# Patient Record
Sex: Female | Born: 1970 | Race: White | Hispanic: No | Smoking: Never smoker
Health system: Southern US, Community
[De-identification: ages and names within clinical notes are randomized; demographics above are authoritative.]

## PROBLEM LIST (undated history)

## (undated) DIAGNOSIS — G4730 Sleep apnea, unspecified: Secondary | ICD-10-CM

## (undated) DIAGNOSIS — M4802 Spinal stenosis, cervical region: Secondary | ICD-10-CM

## (undated) DIAGNOSIS — F419 Anxiety disorder, unspecified: Secondary | ICD-10-CM

## (undated) DIAGNOSIS — F329 Major depressive disorder, single episode, unspecified: Secondary | ICD-10-CM

## (undated) DIAGNOSIS — K589 Irritable bowel syndrome without diarrhea: Secondary | ICD-10-CM

## (undated) DIAGNOSIS — F32A Depression, unspecified: Secondary | ICD-10-CM

## (undated) DIAGNOSIS — R569 Unspecified convulsions: Secondary | ICD-10-CM

## (undated) DIAGNOSIS — M1990 Unspecified osteoarthritis, unspecified site: Secondary | ICD-10-CM

## (undated) DIAGNOSIS — R7303 Prediabetes: Secondary | ICD-10-CM

## (undated) DIAGNOSIS — J189 Pneumonia, unspecified organism: Secondary | ICD-10-CM

## (undated) DIAGNOSIS — I10 Essential (primary) hypertension: Secondary | ICD-10-CM

## (undated) DIAGNOSIS — E662 Morbid (severe) obesity with alveolar hypoventilation: Secondary | ICD-10-CM

## (undated) DIAGNOSIS — Z8709 Personal history of other diseases of the respiratory system: Secondary | ICD-10-CM

## (undated) DIAGNOSIS — F319 Bipolar disorder, unspecified: Secondary | ICD-10-CM

## (undated) DIAGNOSIS — R0902 Hypoxemia: Secondary | ICD-10-CM

## (undated) DIAGNOSIS — G5602 Carpal tunnel syndrome, left upper limb: Secondary | ICD-10-CM

## (undated) DIAGNOSIS — I2781 Cor pulmonale (chronic): Secondary | ICD-10-CM

## (undated) DIAGNOSIS — F4000 Agoraphobia, unspecified: Secondary | ICD-10-CM

## (undated) DIAGNOSIS — E785 Hyperlipidemia, unspecified: Secondary | ICD-10-CM

## (undated) DIAGNOSIS — I5021 Acute systolic (congestive) heart failure: Secondary | ICD-10-CM

## (undated) DIAGNOSIS — J302 Other seasonal allergic rhinitis: Secondary | ICD-10-CM

## (undated) HISTORY — DX: Agoraphobia, unspecified: F40.00

## (undated) HISTORY — DX: Morbid (severe) obesity with alveolar hypoventilation: E66.2

## (undated) HISTORY — DX: Hyperlipidemia, unspecified: E78.5

## (undated) HISTORY — DX: Prediabetes: R73.03

## (undated) HISTORY — DX: Cor pulmonale (chronic): I27.81

## (undated) HISTORY — DX: Personal history of other diseases of the respiratory system: Z87.09

## (undated) HISTORY — DX: Bipolar disorder, unspecified: F31.9

## (undated) HISTORY — DX: Hypoxemia: R09.02

## (undated) HISTORY — DX: Morbid (severe) obesity due to excess calories: E66.01

## (undated) HISTORY — DX: Acute systolic (congestive) heart failure: I50.21

## (undated) HISTORY — DX: Depression, unspecified: F32.A

## (undated) HISTORY — DX: Major depressive disorder, single episode, unspecified: F32.9

## (undated) HISTORY — PX: WISDOM TOOTH EXTRACTION: SHX21

## (undated) HISTORY — DX: Essential (primary) hypertension: I10

---

## 1998-05-03 ENCOUNTER — Ambulatory Visit (HOSPITAL_BASED_OUTPATIENT_CLINIC_OR_DEPARTMENT_OTHER): Admission: RE | Admit: 1998-05-03 | Discharge: 1998-05-03 | Payer: Self-pay | Admitting: Oral Surgery

## 1998-05-09 ENCOUNTER — Ambulatory Visit (HOSPITAL_COMMUNITY): Admission: RE | Admit: 1998-05-09 | Discharge: 1998-05-09 | Payer: Self-pay | Admitting: Specialist

## 1998-05-09 ENCOUNTER — Encounter: Payer: Self-pay | Admitting: Specialist

## 2000-02-22 ENCOUNTER — Ambulatory Visit (HOSPITAL_BASED_OUTPATIENT_CLINIC_OR_DEPARTMENT_OTHER): Admission: RE | Admit: 2000-02-22 | Discharge: 2000-02-22 | Payer: Self-pay | Admitting: Otolaryngology

## 2000-09-23 ENCOUNTER — Encounter: Payer: Self-pay | Admitting: Gastroenterology

## 2000-09-23 ENCOUNTER — Encounter: Admission: RE | Admit: 2000-09-23 | Discharge: 2000-09-23 | Payer: Self-pay | Admitting: Gastroenterology

## 2001-06-16 ENCOUNTER — Other Ambulatory Visit: Admission: RE | Admit: 2001-06-16 | Discharge: 2001-06-16 | Payer: Self-pay | Admitting: Obstetrics and Gynecology

## 2001-07-07 ENCOUNTER — Ambulatory Visit (HOSPITAL_COMMUNITY): Admission: RE | Admit: 2001-07-07 | Discharge: 2001-07-07 | Payer: Self-pay | Admitting: Obstetrics and Gynecology

## 2001-07-07 ENCOUNTER — Encounter: Payer: Self-pay | Admitting: Obstetrics and Gynecology

## 2001-10-23 ENCOUNTER — Encounter: Admission: RE | Admit: 2001-10-23 | Discharge: 2002-01-21 | Payer: Self-pay | Admitting: Internal Medicine

## 2001-12-18 ENCOUNTER — Encounter: Admission: RE | Admit: 2001-12-18 | Discharge: 2002-03-18 | Payer: Self-pay | Admitting: Internal Medicine

## 2002-07-23 ENCOUNTER — Other Ambulatory Visit: Admission: RE | Admit: 2002-07-23 | Discharge: 2002-07-23 | Payer: Self-pay | Admitting: Obstetrics and Gynecology

## 2003-08-25 ENCOUNTER — Other Ambulatory Visit: Admission: RE | Admit: 2003-08-25 | Discharge: 2003-08-25 | Payer: Self-pay | Admitting: Obstetrics and Gynecology

## 2003-11-30 ENCOUNTER — Ambulatory Visit: Payer: Self-pay | Admitting: Psychology

## 2003-12-07 ENCOUNTER — Ambulatory Visit: Payer: Self-pay | Admitting: Psychology

## 2003-12-14 ENCOUNTER — Ambulatory Visit: Payer: Self-pay | Admitting: Psychology

## 2003-12-28 ENCOUNTER — Ambulatory Visit: Payer: Self-pay | Admitting: Psychology

## 2004-01-04 ENCOUNTER — Ambulatory Visit: Payer: Self-pay | Admitting: Psychology

## 2004-01-11 ENCOUNTER — Ambulatory Visit: Payer: Self-pay | Admitting: Psychology

## 2004-01-18 ENCOUNTER — Ambulatory Visit: Payer: Self-pay | Admitting: Psychology

## 2004-01-25 ENCOUNTER — Ambulatory Visit: Payer: Self-pay | Admitting: Psychology

## 2004-02-08 ENCOUNTER — Ambulatory Visit: Payer: Self-pay | Admitting: Psychology

## 2004-02-15 ENCOUNTER — Ambulatory Visit: Payer: Self-pay | Admitting: Psychology

## 2004-02-22 ENCOUNTER — Ambulatory Visit: Payer: Self-pay | Admitting: Psychology

## 2004-02-29 ENCOUNTER — Ambulatory Visit: Payer: Self-pay | Admitting: Psychology

## 2004-03-07 ENCOUNTER — Ambulatory Visit: Payer: Self-pay | Admitting: Psychology

## 2004-03-14 ENCOUNTER — Ambulatory Visit: Payer: Self-pay | Admitting: Psychology

## 2004-03-21 ENCOUNTER — Ambulatory Visit: Payer: Self-pay | Admitting: Psychology

## 2004-04-04 ENCOUNTER — Ambulatory Visit: Payer: Self-pay | Admitting: Psychology

## 2004-04-11 ENCOUNTER — Ambulatory Visit: Payer: Self-pay | Admitting: Psychology

## 2004-04-18 ENCOUNTER — Ambulatory Visit: Payer: Self-pay | Admitting: Psychology

## 2004-04-25 ENCOUNTER — Ambulatory Visit: Payer: Self-pay | Admitting: Psychology

## 2004-05-09 ENCOUNTER — Ambulatory Visit: Payer: Self-pay | Admitting: Psychology

## 2004-05-16 ENCOUNTER — Ambulatory Visit: Payer: Self-pay | Admitting: Psychology

## 2004-05-23 ENCOUNTER — Ambulatory Visit: Payer: Self-pay | Admitting: Psychology

## 2004-06-01 ENCOUNTER — Ambulatory Visit: Payer: Self-pay | Admitting: Psychology

## 2004-06-06 ENCOUNTER — Ambulatory Visit: Payer: Self-pay | Admitting: Psychology

## 2004-06-13 ENCOUNTER — Ambulatory Visit: Payer: Self-pay | Admitting: Psychology

## 2004-06-27 ENCOUNTER — Ambulatory Visit: Payer: Self-pay | Admitting: Psychology

## 2004-07-12 ENCOUNTER — Ambulatory Visit: Payer: Self-pay | Admitting: Psychology

## 2004-07-18 ENCOUNTER — Ambulatory Visit: Payer: Self-pay | Admitting: Psychology

## 2004-07-24 ENCOUNTER — Encounter: Admission: RE | Admit: 2004-07-24 | Discharge: 2004-10-22 | Payer: Self-pay | Admitting: Internal Medicine

## 2004-07-25 ENCOUNTER — Ambulatory Visit: Payer: Self-pay | Admitting: Psychology

## 2004-08-03 ENCOUNTER — Ambulatory Visit: Payer: Self-pay | Admitting: Psychology

## 2004-08-15 ENCOUNTER — Ambulatory Visit: Payer: Self-pay | Admitting: Psychology

## 2004-08-22 ENCOUNTER — Ambulatory Visit: Payer: Self-pay | Admitting: Psychology

## 2004-08-25 ENCOUNTER — Other Ambulatory Visit: Admission: RE | Admit: 2004-08-25 | Discharge: 2004-08-25 | Payer: Self-pay | Admitting: Obstetrics and Gynecology

## 2004-08-29 ENCOUNTER — Ambulatory Visit: Payer: Self-pay | Admitting: Psychology

## 2004-09-07 ENCOUNTER — Ambulatory Visit: Payer: Self-pay | Admitting: Psychology

## 2004-09-19 ENCOUNTER — Ambulatory Visit: Payer: Self-pay | Admitting: Psychology

## 2004-09-26 ENCOUNTER — Ambulatory Visit: Payer: Self-pay | Admitting: Psychology

## 2004-10-03 ENCOUNTER — Ambulatory Visit: Payer: Self-pay | Admitting: Psychology

## 2004-10-10 ENCOUNTER — Ambulatory Visit: Payer: Self-pay | Admitting: Psychology

## 2004-10-17 ENCOUNTER — Ambulatory Visit: Payer: Self-pay | Admitting: Psychology

## 2004-10-24 ENCOUNTER — Ambulatory Visit: Payer: Self-pay | Admitting: Psychology

## 2004-10-31 ENCOUNTER — Ambulatory Visit: Payer: Self-pay | Admitting: Psychology

## 2004-11-07 ENCOUNTER — Ambulatory Visit: Payer: Self-pay | Admitting: Psychology

## 2004-11-14 ENCOUNTER — Ambulatory Visit: Payer: Self-pay | Admitting: Psychology

## 2004-11-28 ENCOUNTER — Ambulatory Visit: Payer: Self-pay | Admitting: Psychology

## 2004-12-05 ENCOUNTER — Ambulatory Visit: Payer: Self-pay | Admitting: Psychology

## 2004-12-12 ENCOUNTER — Ambulatory Visit: Payer: Self-pay | Admitting: Psychology

## 2004-12-20 ENCOUNTER — Ambulatory Visit: Payer: Self-pay | Admitting: Psychology

## 2004-12-26 ENCOUNTER — Ambulatory Visit: Payer: Self-pay | Admitting: Psychology

## 2005-01-02 ENCOUNTER — Ambulatory Visit: Payer: Self-pay | Admitting: Psychology

## 2005-01-09 ENCOUNTER — Ambulatory Visit: Payer: Self-pay | Admitting: Psychology

## 2005-01-16 ENCOUNTER — Ambulatory Visit: Payer: Self-pay | Admitting: Psychology

## 2005-02-06 ENCOUNTER — Ambulatory Visit: Payer: Self-pay | Admitting: Psychology

## 2005-02-13 ENCOUNTER — Ambulatory Visit: Payer: Self-pay | Admitting: Psychology

## 2005-02-20 ENCOUNTER — Ambulatory Visit: Payer: Self-pay | Admitting: Psychology

## 2005-02-27 ENCOUNTER — Ambulatory Visit: Payer: Self-pay | Admitting: Psychology

## 2005-03-13 ENCOUNTER — Ambulatory Visit: Payer: Self-pay | Admitting: Psychology

## 2005-03-27 ENCOUNTER — Ambulatory Visit: Payer: Self-pay | Admitting: Psychology

## 2005-04-03 ENCOUNTER — Ambulatory Visit: Payer: Self-pay | Admitting: Psychology

## 2005-04-10 ENCOUNTER — Ambulatory Visit: Payer: Self-pay | Admitting: Psychology

## 2005-05-01 ENCOUNTER — Ambulatory Visit: Payer: Self-pay | Admitting: Psychology

## 2005-05-08 ENCOUNTER — Ambulatory Visit: Payer: Self-pay | Admitting: Psychology

## 2005-05-15 ENCOUNTER — Ambulatory Visit: Payer: Self-pay | Admitting: Psychology

## 2005-05-22 ENCOUNTER — Ambulatory Visit: Payer: Self-pay | Admitting: Psychology

## 2005-05-28 ENCOUNTER — Emergency Department (HOSPITAL_COMMUNITY): Admission: EM | Admit: 2005-05-28 | Discharge: 2005-05-28 | Payer: Self-pay | Admitting: Emergency Medicine

## 2005-05-29 ENCOUNTER — Ambulatory Visit: Payer: Self-pay | Admitting: Psychology

## 2005-06-05 ENCOUNTER — Ambulatory Visit: Payer: Self-pay | Admitting: Psychology

## 2005-06-12 ENCOUNTER — Ambulatory Visit: Payer: Self-pay | Admitting: Psychology

## 2005-06-19 ENCOUNTER — Ambulatory Visit: Payer: Self-pay | Admitting: Psychology

## 2005-06-26 ENCOUNTER — Ambulatory Visit: Payer: Self-pay | Admitting: Psychology

## 2005-07-03 ENCOUNTER — Ambulatory Visit: Payer: Self-pay | Admitting: Psychology

## 2005-07-10 ENCOUNTER — Ambulatory Visit: Payer: Self-pay | Admitting: Psychology

## 2005-07-17 ENCOUNTER — Ambulatory Visit: Payer: Self-pay | Admitting: Psychology

## 2005-07-24 ENCOUNTER — Ambulatory Visit: Payer: Self-pay | Admitting: Psychology

## 2005-07-31 ENCOUNTER — Ambulatory Visit: Payer: Self-pay | Admitting: Psychology

## 2005-08-07 ENCOUNTER — Ambulatory Visit: Payer: Self-pay | Admitting: Psychology

## 2005-08-14 ENCOUNTER — Ambulatory Visit: Payer: Self-pay | Admitting: Psychology

## 2005-08-21 ENCOUNTER — Ambulatory Visit: Payer: Self-pay | Admitting: Psychology

## 2005-08-28 ENCOUNTER — Ambulatory Visit: Payer: Self-pay | Admitting: Psychology

## 2005-09-04 ENCOUNTER — Ambulatory Visit: Payer: Self-pay | Admitting: Psychology

## 2005-09-11 ENCOUNTER — Ambulatory Visit: Payer: Self-pay | Admitting: Psychology

## 2005-09-25 ENCOUNTER — Ambulatory Visit: Payer: Self-pay | Admitting: Psychology

## 2005-10-02 ENCOUNTER — Ambulatory Visit: Payer: Self-pay | Admitting: Psychology

## 2005-10-09 ENCOUNTER — Ambulatory Visit: Payer: Self-pay | Admitting: Psychology

## 2005-10-16 ENCOUNTER — Ambulatory Visit: Payer: Self-pay | Admitting: Psychology

## 2005-10-30 ENCOUNTER — Ambulatory Visit: Payer: Self-pay | Admitting: Psychology

## 2005-11-06 ENCOUNTER — Ambulatory Visit: Payer: Self-pay | Admitting: Psychology

## 2005-11-13 ENCOUNTER — Ambulatory Visit: Payer: Self-pay | Admitting: Psychology

## 2005-11-27 ENCOUNTER — Ambulatory Visit: Payer: Self-pay | Admitting: Psychology

## 2005-12-04 ENCOUNTER — Ambulatory Visit: Payer: Self-pay | Admitting: Psychology

## 2005-12-18 ENCOUNTER — Ambulatory Visit: Payer: Self-pay | Admitting: Psychology

## 2005-12-25 ENCOUNTER — Ambulatory Visit: Payer: Self-pay | Admitting: Psychology

## 2006-01-01 ENCOUNTER — Ambulatory Visit: Payer: Self-pay | Admitting: Psychology

## 2006-01-08 ENCOUNTER — Ambulatory Visit: Payer: Self-pay | Admitting: Psychology

## 2006-01-15 ENCOUNTER — Ambulatory Visit: Payer: Self-pay | Admitting: Psychology

## 2006-01-31 ENCOUNTER — Ambulatory Visit: Payer: Self-pay | Admitting: Psychology

## 2006-02-05 ENCOUNTER — Ambulatory Visit: Payer: Self-pay | Admitting: Psychology

## 2006-02-12 ENCOUNTER — Ambulatory Visit: Payer: Self-pay | Admitting: Psychology

## 2006-02-19 ENCOUNTER — Ambulatory Visit: Payer: Self-pay | Admitting: Psychology

## 2006-02-26 ENCOUNTER — Ambulatory Visit: Payer: Self-pay | Admitting: Psychology

## 2006-03-05 ENCOUNTER — Ambulatory Visit: Payer: Self-pay | Admitting: Psychology

## 2006-03-12 ENCOUNTER — Ambulatory Visit: Payer: Self-pay | Admitting: Psychology

## 2006-03-19 ENCOUNTER — Ambulatory Visit: Payer: Self-pay | Admitting: Psychology

## 2006-03-26 ENCOUNTER — Ambulatory Visit: Payer: Self-pay | Admitting: Psychology

## 2006-04-09 ENCOUNTER — Ambulatory Visit: Payer: Self-pay | Admitting: Psychology

## 2006-04-16 ENCOUNTER — Ambulatory Visit: Payer: Self-pay | Admitting: Psychology

## 2006-04-30 ENCOUNTER — Ambulatory Visit: Payer: Self-pay | Admitting: Psychology

## 2006-05-07 ENCOUNTER — Ambulatory Visit: Payer: Self-pay | Admitting: Psychology

## 2006-05-14 ENCOUNTER — Ambulatory Visit: Payer: Self-pay | Admitting: Psychology

## 2006-05-21 ENCOUNTER — Ambulatory Visit: Payer: Self-pay | Admitting: Psychology

## 2006-05-28 ENCOUNTER — Ambulatory Visit: Payer: Self-pay | Admitting: Psychology

## 2006-06-04 ENCOUNTER — Ambulatory Visit: Payer: Self-pay | Admitting: Psychology

## 2006-06-11 ENCOUNTER — Ambulatory Visit: Payer: Self-pay | Admitting: Psychology

## 2006-06-25 ENCOUNTER — Ambulatory Visit: Payer: Self-pay | Admitting: Psychology

## 2006-07-02 ENCOUNTER — Ambulatory Visit: Payer: Self-pay | Admitting: Psychology

## 2006-07-30 ENCOUNTER — Ambulatory Visit: Payer: Self-pay | Admitting: Psychology

## 2006-08-06 ENCOUNTER — Ambulatory Visit: Payer: Self-pay | Admitting: Psychology

## 2006-08-13 ENCOUNTER — Ambulatory Visit: Payer: Self-pay | Admitting: Psychology

## 2006-08-20 ENCOUNTER — Ambulatory Visit: Payer: Self-pay | Admitting: Psychology

## 2006-08-27 ENCOUNTER — Ambulatory Visit: Payer: Self-pay | Admitting: Psychology

## 2006-09-03 ENCOUNTER — Ambulatory Visit: Payer: Self-pay | Admitting: Psychology

## 2006-09-10 ENCOUNTER — Ambulatory Visit: Payer: Self-pay | Admitting: Psychology

## 2006-10-01 ENCOUNTER — Ambulatory Visit: Payer: Self-pay | Admitting: Psychology

## 2006-10-15 ENCOUNTER — Ambulatory Visit: Payer: Self-pay | Admitting: Psychology

## 2006-11-05 ENCOUNTER — Ambulatory Visit: Payer: Self-pay | Admitting: Psychology

## 2006-11-12 ENCOUNTER — Ambulatory Visit: Payer: Self-pay | Admitting: Psychology

## 2006-11-19 ENCOUNTER — Ambulatory Visit: Payer: Self-pay | Admitting: Psychology

## 2006-11-26 ENCOUNTER — Ambulatory Visit: Payer: Self-pay | Admitting: Psychology

## 2006-12-03 ENCOUNTER — Ambulatory Visit: Payer: Self-pay | Admitting: Psychology

## 2006-12-10 ENCOUNTER — Ambulatory Visit: Payer: Self-pay | Admitting: Psychology

## 2006-12-17 ENCOUNTER — Ambulatory Visit: Payer: Self-pay | Admitting: Psychology

## 2006-12-24 ENCOUNTER — Ambulatory Visit: Payer: Self-pay | Admitting: Psychology

## 2006-12-31 ENCOUNTER — Ambulatory Visit: Payer: Self-pay | Admitting: Psychology

## 2007-01-07 ENCOUNTER — Ambulatory Visit: Payer: Self-pay | Admitting: Psychology

## 2007-01-14 ENCOUNTER — Ambulatory Visit: Payer: Self-pay | Admitting: Psychology

## 2007-01-21 ENCOUNTER — Ambulatory Visit: Payer: Self-pay | Admitting: Psychology

## 2007-02-04 ENCOUNTER — Ambulatory Visit: Payer: Self-pay | Admitting: Psychology

## 2007-02-11 ENCOUNTER — Ambulatory Visit: Payer: Self-pay | Admitting: Psychology

## 2007-02-18 ENCOUNTER — Ambulatory Visit: Payer: Self-pay | Admitting: Psychology

## 2007-02-25 ENCOUNTER — Ambulatory Visit: Payer: Self-pay | Admitting: Psychology

## 2007-03-04 ENCOUNTER — Ambulatory Visit: Payer: Self-pay | Admitting: Psychology

## 2007-03-11 ENCOUNTER — Ambulatory Visit: Payer: Self-pay | Admitting: Psychology

## 2007-03-25 ENCOUNTER — Ambulatory Visit: Payer: Self-pay | Admitting: Psychology

## 2007-04-08 ENCOUNTER — Ambulatory Visit: Payer: Self-pay | Admitting: Psychology

## 2007-04-15 ENCOUNTER — Ambulatory Visit: Payer: Self-pay | Admitting: Psychology

## 2007-04-29 ENCOUNTER — Ambulatory Visit: Payer: Self-pay | Admitting: Psychology

## 2007-05-06 ENCOUNTER — Ambulatory Visit: Payer: Self-pay | Admitting: Psychology

## 2007-05-13 ENCOUNTER — Ambulatory Visit: Payer: Self-pay | Admitting: Psychology

## 2007-05-20 ENCOUNTER — Ambulatory Visit: Payer: Self-pay | Admitting: Psychology

## 2007-05-27 ENCOUNTER — Ambulatory Visit: Payer: Self-pay | Admitting: Psychology

## 2007-06-10 ENCOUNTER — Ambulatory Visit: Payer: Self-pay | Admitting: Psychology

## 2007-06-24 ENCOUNTER — Ambulatory Visit: Payer: Self-pay | Admitting: Psychology

## 2007-07-01 ENCOUNTER — Ambulatory Visit: Payer: Self-pay | Admitting: Psychology

## 2007-07-08 ENCOUNTER — Ambulatory Visit: Payer: Self-pay | Admitting: Psychology

## 2007-07-15 ENCOUNTER — Ambulatory Visit: Payer: Self-pay | Admitting: Psychology

## 2007-07-22 ENCOUNTER — Ambulatory Visit: Payer: Self-pay | Admitting: Psychology

## 2007-08-19 ENCOUNTER — Ambulatory Visit: Payer: Self-pay | Admitting: Psychology

## 2007-08-26 ENCOUNTER — Ambulatory Visit: Payer: Self-pay | Admitting: Psychology

## 2007-09-02 ENCOUNTER — Ambulatory Visit: Payer: Self-pay | Admitting: Psychology

## 2007-09-16 ENCOUNTER — Ambulatory Visit: Payer: Self-pay | Admitting: Psychology

## 2007-09-23 ENCOUNTER — Ambulatory Visit: Payer: Self-pay | Admitting: Psychology

## 2007-09-30 ENCOUNTER — Ambulatory Visit: Payer: Self-pay | Admitting: Psychology

## 2007-10-07 ENCOUNTER — Ambulatory Visit: Payer: Self-pay | Admitting: Psychology

## 2007-10-28 ENCOUNTER — Ambulatory Visit: Payer: Self-pay | Admitting: Psychology

## 2007-11-11 ENCOUNTER — Ambulatory Visit: Payer: Self-pay | Admitting: Psychology

## 2007-11-18 ENCOUNTER — Ambulatory Visit: Payer: Self-pay | Admitting: Psychology

## 2007-11-25 ENCOUNTER — Ambulatory Visit: Payer: Self-pay | Admitting: Psychology

## 2007-12-16 ENCOUNTER — Ambulatory Visit: Payer: Self-pay | Admitting: Psychology

## 2007-12-23 ENCOUNTER — Ambulatory Visit: Payer: Self-pay | Admitting: Psychology

## 2007-12-30 ENCOUNTER — Ambulatory Visit: Payer: Self-pay | Admitting: Psychology

## 2008-01-13 ENCOUNTER — Ambulatory Visit: Payer: Self-pay | Admitting: Psychology

## 2008-01-20 ENCOUNTER — Ambulatory Visit: Payer: Self-pay | Admitting: Psychology

## 2008-01-27 ENCOUNTER — Ambulatory Visit: Payer: Self-pay | Admitting: Psychology

## 2008-02-10 ENCOUNTER — Ambulatory Visit: Payer: Self-pay | Admitting: Psychology

## 2008-02-17 ENCOUNTER — Ambulatory Visit: Payer: Self-pay | Admitting: Psychology

## 2008-02-24 ENCOUNTER — Ambulatory Visit: Payer: Self-pay | Admitting: Psychology

## 2008-03-02 ENCOUNTER — Ambulatory Visit: Payer: Self-pay | Admitting: Psychology

## 2008-03-16 ENCOUNTER — Ambulatory Visit: Payer: Self-pay | Admitting: Psychology

## 2008-03-23 ENCOUNTER — Ambulatory Visit: Payer: Self-pay | Admitting: Psychology

## 2008-03-30 ENCOUNTER — Ambulatory Visit: Payer: Self-pay | Admitting: Psychology

## 2008-04-06 ENCOUNTER — Ambulatory Visit: Payer: Self-pay | Admitting: Psychology

## 2008-04-13 ENCOUNTER — Ambulatory Visit: Payer: Self-pay | Admitting: Psychology

## 2008-04-20 ENCOUNTER — Ambulatory Visit: Payer: Self-pay | Admitting: Psychology

## 2008-04-27 ENCOUNTER — Ambulatory Visit: Payer: Self-pay | Admitting: Psychology

## 2008-05-04 ENCOUNTER — Ambulatory Visit: Payer: Self-pay | Admitting: Psychology

## 2008-05-11 ENCOUNTER — Ambulatory Visit: Payer: Self-pay | Admitting: Psychology

## 2008-05-25 ENCOUNTER — Ambulatory Visit: Payer: Self-pay | Admitting: Psychology

## 2008-06-01 ENCOUNTER — Ambulatory Visit: Payer: Self-pay | Admitting: Psychology

## 2008-06-08 ENCOUNTER — Ambulatory Visit: Payer: Self-pay | Admitting: Psychology

## 2008-06-15 ENCOUNTER — Ambulatory Visit: Payer: Self-pay | Admitting: Psychology

## 2008-06-21 ENCOUNTER — Ambulatory Visit: Payer: Self-pay | Admitting: Psychology

## 2008-06-29 ENCOUNTER — Ambulatory Visit: Payer: Self-pay | Admitting: Psychology

## 2008-07-06 ENCOUNTER — Ambulatory Visit: Payer: Self-pay | Admitting: Psychology

## 2008-07-13 ENCOUNTER — Ambulatory Visit: Payer: Self-pay | Admitting: Psychology

## 2008-07-20 ENCOUNTER — Ambulatory Visit: Payer: Self-pay | Admitting: Psychology

## 2008-08-10 ENCOUNTER — Ambulatory Visit: Payer: Self-pay | Admitting: Psychology

## 2008-08-17 ENCOUNTER — Ambulatory Visit: Payer: Self-pay | Admitting: Psychology

## 2008-08-24 ENCOUNTER — Ambulatory Visit: Payer: Self-pay | Admitting: Psychology

## 2008-08-31 ENCOUNTER — Ambulatory Visit: Payer: Self-pay | Admitting: Psychology

## 2008-09-14 ENCOUNTER — Ambulatory Visit: Payer: Self-pay | Admitting: Psychology

## 2008-09-21 ENCOUNTER — Ambulatory Visit: Payer: Self-pay | Admitting: Psychology

## 2008-10-05 ENCOUNTER — Ambulatory Visit: Payer: Self-pay | Admitting: Psychology

## 2008-10-12 ENCOUNTER — Ambulatory Visit: Payer: Self-pay | Admitting: Psychology

## 2008-10-19 ENCOUNTER — Ambulatory Visit: Payer: Self-pay | Admitting: Psychology

## 2008-10-26 ENCOUNTER — Ambulatory Visit: Payer: Self-pay | Admitting: Psychology

## 2008-11-02 ENCOUNTER — Ambulatory Visit: Payer: Self-pay | Admitting: Psychology

## 2008-11-09 ENCOUNTER — Ambulatory Visit: Payer: Self-pay | Admitting: Psychology

## 2008-11-16 ENCOUNTER — Ambulatory Visit: Payer: Self-pay | Admitting: Psychology

## 2008-11-23 ENCOUNTER — Ambulatory Visit: Payer: Self-pay | Admitting: Psychology

## 2008-11-30 ENCOUNTER — Ambulatory Visit: Payer: Self-pay | Admitting: Psychology

## 2008-12-07 ENCOUNTER — Ambulatory Visit: Payer: Self-pay | Admitting: Psychology

## 2008-12-21 ENCOUNTER — Ambulatory Visit: Payer: Self-pay | Admitting: Psychology

## 2008-12-28 ENCOUNTER — Ambulatory Visit: Payer: Self-pay | Admitting: Psychology

## 2009-01-04 ENCOUNTER — Ambulatory Visit: Payer: Self-pay | Admitting: Psychology

## 2009-01-18 ENCOUNTER — Ambulatory Visit: Payer: Self-pay | Admitting: Psychology

## 2009-01-25 ENCOUNTER — Ambulatory Visit: Payer: Self-pay | Admitting: Psychology

## 2009-02-15 ENCOUNTER — Ambulatory Visit: Payer: Self-pay | Admitting: Psychology

## 2009-02-22 ENCOUNTER — Ambulatory Visit: Payer: Self-pay | Admitting: Psychology

## 2009-03-01 ENCOUNTER — Ambulatory Visit: Payer: Self-pay | Admitting: Psychology

## 2009-03-08 ENCOUNTER — Ambulatory Visit: Payer: Self-pay | Admitting: Psychology

## 2009-03-15 ENCOUNTER — Ambulatory Visit: Payer: Self-pay | Admitting: Psychology

## 2009-03-22 ENCOUNTER — Ambulatory Visit: Payer: Self-pay | Admitting: Psychology

## 2009-03-29 ENCOUNTER — Ambulatory Visit: Payer: Self-pay | Admitting: Psychology

## 2009-04-12 ENCOUNTER — Ambulatory Visit: Payer: Self-pay | Admitting: Psychology

## 2009-04-19 ENCOUNTER — Ambulatory Visit: Payer: Self-pay | Admitting: Psychology

## 2009-04-26 ENCOUNTER — Ambulatory Visit: Payer: Self-pay | Admitting: Psychology

## 2009-05-03 ENCOUNTER — Ambulatory Visit: Payer: Self-pay | Admitting: Psychology

## 2009-05-10 ENCOUNTER — Ambulatory Visit: Payer: Self-pay | Admitting: Psychology

## 2009-06-01 ENCOUNTER — Ambulatory Visit: Payer: Self-pay | Admitting: Psychology

## 2009-06-07 ENCOUNTER — Ambulatory Visit: Payer: Self-pay | Admitting: Psychology

## 2009-06-14 ENCOUNTER — Ambulatory Visit: Payer: Self-pay | Admitting: Psychology

## 2009-06-21 ENCOUNTER — Ambulatory Visit: Payer: Self-pay | Admitting: Psychology

## 2009-06-28 ENCOUNTER — Ambulatory Visit: Payer: Self-pay | Admitting: Psychology

## 2009-07-05 ENCOUNTER — Ambulatory Visit: Payer: Self-pay | Admitting: Psychology

## 2009-07-12 ENCOUNTER — Ambulatory Visit: Payer: Self-pay | Admitting: Psychology

## 2009-07-19 ENCOUNTER — Ambulatory Visit: Payer: Self-pay | Admitting: Psychology

## 2009-08-02 ENCOUNTER — Ambulatory Visit: Payer: Self-pay | Admitting: Psychology

## 2009-08-09 ENCOUNTER — Ambulatory Visit: Payer: Self-pay | Admitting: Psychology

## 2009-08-16 ENCOUNTER — Ambulatory Visit: Payer: Self-pay | Admitting: Psychology

## 2009-08-23 ENCOUNTER — Ambulatory Visit: Payer: Self-pay | Admitting: Psychology

## 2009-08-30 ENCOUNTER — Ambulatory Visit: Payer: Self-pay | Admitting: Psychology

## 2009-09-06 ENCOUNTER — Ambulatory Visit: Payer: Self-pay | Admitting: Psychology

## 2009-09-20 ENCOUNTER — Ambulatory Visit: Payer: Self-pay | Admitting: Psychology

## 2009-09-27 ENCOUNTER — Ambulatory Visit: Payer: Self-pay | Admitting: Psychology

## 2009-10-04 ENCOUNTER — Ambulatory Visit: Payer: Self-pay | Admitting: Psychology

## 2009-10-11 ENCOUNTER — Ambulatory Visit: Payer: Self-pay | Admitting: Psychology

## 2009-10-18 ENCOUNTER — Ambulatory Visit: Payer: Self-pay | Admitting: Psychology

## 2009-10-25 ENCOUNTER — Ambulatory Visit: Payer: Self-pay | Admitting: Psychology

## 2009-11-08 ENCOUNTER — Ambulatory Visit: Payer: Self-pay | Admitting: Psychology

## 2009-11-15 ENCOUNTER — Ambulatory Visit: Payer: Self-pay | Admitting: Psychology

## 2009-11-22 ENCOUNTER — Ambulatory Visit: Payer: Self-pay | Admitting: Psychology

## 2009-11-29 ENCOUNTER — Ambulatory Visit: Payer: Self-pay | Admitting: Psychology

## 2009-12-06 ENCOUNTER — Ambulatory Visit: Payer: Self-pay | Admitting: Psychology

## 2009-12-13 ENCOUNTER — Ambulatory Visit: Payer: Self-pay | Admitting: Psychology

## 2009-12-20 ENCOUNTER — Ambulatory Visit: Payer: Self-pay | Admitting: Psychology

## 2009-12-27 ENCOUNTER — Ambulatory Visit: Payer: Self-pay | Admitting: Psychology

## 2010-01-03 ENCOUNTER — Ambulatory Visit: Payer: Self-pay | Admitting: Psychology

## 2010-01-10 ENCOUNTER — Ambulatory Visit: Payer: Self-pay | Admitting: Psychology

## 2010-01-17 ENCOUNTER — Ambulatory Visit: Payer: Self-pay | Admitting: Psychology

## 2010-01-24 ENCOUNTER — Ambulatory Visit: Payer: Self-pay | Admitting: Psychology

## 2010-01-31 ENCOUNTER — Ambulatory Visit
Admission: RE | Admit: 2010-01-31 | Discharge: 2010-01-31 | Payer: Self-pay | Source: Home / Self Care | Attending: Psychology | Admitting: Psychology

## 2010-02-07 ENCOUNTER — Ambulatory Visit
Admission: RE | Admit: 2010-02-07 | Discharge: 2010-02-07 | Payer: Self-pay | Source: Home / Self Care | Attending: Psychology | Admitting: Psychology

## 2010-02-14 ENCOUNTER — Ambulatory Visit
Admission: RE | Admit: 2010-02-14 | Discharge: 2010-02-14 | Payer: Self-pay | Source: Home / Self Care | Attending: Psychology | Admitting: Psychology

## 2010-02-21 ENCOUNTER — Ambulatory Visit
Admission: RE | Admit: 2010-02-21 | Discharge: 2010-02-21 | Payer: Self-pay | Source: Home / Self Care | Attending: Psychology | Admitting: Psychology

## 2010-02-28 ENCOUNTER — Ambulatory Visit: Admit: 2010-02-28 | Payer: Self-pay | Admitting: Psychology

## 2010-03-07 ENCOUNTER — Ambulatory Visit (INDEPENDENT_AMBULATORY_CARE_PROVIDER_SITE_OTHER): Payer: PRIVATE HEALTH INSURANCE | Admitting: Psychology

## 2010-03-07 DIAGNOSIS — F319 Bipolar disorder, unspecified: Secondary | ICD-10-CM

## 2010-03-21 ENCOUNTER — Ambulatory Visit (INDEPENDENT_AMBULATORY_CARE_PROVIDER_SITE_OTHER): Payer: PRIVATE HEALTH INSURANCE | Admitting: Psychology

## 2010-03-21 DIAGNOSIS — F319 Bipolar disorder, unspecified: Secondary | ICD-10-CM

## 2010-03-28 ENCOUNTER — Ambulatory Visit (INDEPENDENT_AMBULATORY_CARE_PROVIDER_SITE_OTHER): Payer: PRIVATE HEALTH INSURANCE | Admitting: Psychology

## 2010-03-28 DIAGNOSIS — F319 Bipolar disorder, unspecified: Secondary | ICD-10-CM

## 2010-04-04 ENCOUNTER — Ambulatory Visit: Payer: Medicare Other | Admitting: Psychology

## 2010-04-11 ENCOUNTER — Ambulatory Visit (INDEPENDENT_AMBULATORY_CARE_PROVIDER_SITE_OTHER): Payer: PRIVATE HEALTH INSURANCE | Admitting: Psychology

## 2010-04-11 DIAGNOSIS — F319 Bipolar disorder, unspecified: Secondary | ICD-10-CM

## 2010-04-18 ENCOUNTER — Ambulatory Visit (INDEPENDENT_AMBULATORY_CARE_PROVIDER_SITE_OTHER): Payer: PRIVATE HEALTH INSURANCE | Admitting: Psychology

## 2010-04-18 DIAGNOSIS — F319 Bipolar disorder, unspecified: Secondary | ICD-10-CM

## 2010-04-25 ENCOUNTER — Ambulatory Visit (INDEPENDENT_AMBULATORY_CARE_PROVIDER_SITE_OTHER): Payer: PRIVATE HEALTH INSURANCE | Admitting: Psychology

## 2010-04-25 DIAGNOSIS — F319 Bipolar disorder, unspecified: Secondary | ICD-10-CM

## 2010-05-01 ENCOUNTER — Emergency Department (HOSPITAL_COMMUNITY): Payer: 59

## 2010-05-01 ENCOUNTER — Encounter (HOSPITAL_COMMUNITY): Payer: Self-pay | Admitting: Radiology

## 2010-05-01 ENCOUNTER — Inpatient Hospital Stay (HOSPITAL_COMMUNITY)
Admission: EM | Admit: 2010-05-01 | Discharge: 2010-05-04 | DRG: 292 | Disposition: A | Discharge status: Home Health | Payer: 59 | Attending: Internal Medicine | Admitting: Internal Medicine

## 2010-05-01 DIAGNOSIS — J440 Chronic obstructive pulmonary disease with acute lower respiratory infection: Secondary | ICD-10-CM | POA: Diagnosis present

## 2010-05-01 DIAGNOSIS — J209 Acute bronchitis, unspecified: Secondary | ICD-10-CM | POA: Diagnosis present

## 2010-05-01 DIAGNOSIS — G4733 Obstructive sleep apnea (adult) (pediatric): Secondary | ICD-10-CM | POA: Diagnosis present

## 2010-05-01 DIAGNOSIS — I509 Heart failure, unspecified: Secondary | ICD-10-CM | POA: Diagnosis present

## 2010-05-01 DIAGNOSIS — F319 Bipolar disorder, unspecified: Secondary | ICD-10-CM | POA: Diagnosis present

## 2010-05-01 DIAGNOSIS — J90 Pleural effusion, not elsewhere classified: Secondary | ICD-10-CM | POA: Diagnosis present

## 2010-05-01 DIAGNOSIS — E119 Type 2 diabetes mellitus without complications: Secondary | ICD-10-CM | POA: Diagnosis present

## 2010-05-01 DIAGNOSIS — I279 Pulmonary heart disease, unspecified: Secondary | ICD-10-CM | POA: Diagnosis present

## 2010-05-01 DIAGNOSIS — R0902 Hypoxemia: Secondary | ICD-10-CM | POA: Diagnosis present

## 2010-05-01 DIAGNOSIS — I5021 Acute systolic (congestive) heart failure: Principal | ICD-10-CM | POA: Diagnosis present

## 2010-05-01 DIAGNOSIS — E662 Morbid (severe) obesity with alveolar hypoventilation: Secondary | ICD-10-CM | POA: Diagnosis present

## 2010-05-01 LAB — POCT I-STAT, CHEM 8
BUN: 11 mg/dL (ref 6–23)
Calcium, Ion: 1.02 mmol/L — ABNORMAL LOW (ref 1.12–1.32)
Chloride: 101 mEq/L (ref 96–112)
Creatinine, Ser: 0.9 mg/dL (ref 0.4–1.2)
Glucose, Bld: 185 mg/dL — ABNORMAL HIGH (ref 70–99)
HCT: 52 % — ABNORMAL HIGH (ref 36.0–46.0)
Hemoglobin: 17.7 g/dL — ABNORMAL HIGH (ref 12.0–15.0)
Potassium: 4.2 mEq/L (ref 3.5–5.1)
Sodium: 139 mEq/L (ref 135–145)
TCO2: 31 mmol/L (ref 0–100)

## 2010-05-01 LAB — BLOOD GAS, ARTERIAL
Acid-Base Excess: 5 mmol/L — ABNORMAL HIGH (ref 0.0–2.0)
Drawn by: 326301
FIO2: 0.36 %
pCO2 arterial: 49.4 mmHg — ABNORMAL HIGH (ref 35.0–45.0)
pH, Arterial: 7.407 — ABNORMAL HIGH (ref 7.350–7.400)

## 2010-05-01 LAB — CBC
MCH: 31 pg (ref 26.0–34.0)
MCHC: 32.3 g/dL (ref 30.0–36.0)
Platelets: 192 10*3/uL (ref 150–400)
RDW: 15.6 % — ABNORMAL HIGH (ref 11.5–15.5)

## 2010-05-01 LAB — DIFFERENTIAL
Basophils Relative: 0 % (ref 0–1)
Eosinophils Absolute: 0 10*3/uL (ref 0.0–0.7)
Eosinophils Relative: 0 % (ref 0–5)
Monocytes Absolute: 0.6 10*3/uL (ref 0.1–1.0)
Monocytes Relative: 4 % (ref 3–12)

## 2010-05-01 LAB — BRAIN NATRIURETIC PEPTIDE: Pro B Natriuretic peptide (BNP): 566 pg/mL — ABNORMAL HIGH (ref 0.0–100.0)

## 2010-05-01 LAB — RAPID URINE DRUG SCREEN, HOSP PERFORMED
Cocaine: NOT DETECTED
Tetrahydrocannabinol: POSITIVE — AB

## 2010-05-01 MED ORDER — IOHEXOL 350 MG/ML SOLN
125.0000 mL | Freq: Once | INTRAVENOUS | Status: AC | PRN
  Administered 2010-05-01: 125 mL via INTRAVENOUS

## 2010-05-02 ENCOUNTER — Ambulatory Visit: Payer: PRIVATE HEALTH INSURANCE | Admitting: Psychology

## 2010-05-02 DIAGNOSIS — J90 Pleural effusion, not elsewhere classified: Secondary | ICD-10-CM

## 2010-05-02 DIAGNOSIS — I517 Cardiomegaly: Secondary | ICD-10-CM

## 2010-05-02 DIAGNOSIS — I2789 Other specified pulmonary heart diseases: Secondary | ICD-10-CM

## 2010-05-02 DIAGNOSIS — J9620 Acute and chronic respiratory failure, unspecified whether with hypoxia or hypercapnia: Secondary | ICD-10-CM

## 2010-05-02 LAB — CBC
MCV: 99.2 fL (ref 78.0–100.0)
Platelets: 221 10*3/uL (ref 150–400)
RDW: 15.9 % — ABNORMAL HIGH (ref 11.5–15.5)
WBC: 15.4 10*3/uL — ABNORMAL HIGH (ref 4.0–10.5)

## 2010-05-02 LAB — COMPREHENSIVE METABOLIC PANEL
Albumin: 2 g/dL — ABNORMAL LOW (ref 3.5–5.2)
Alkaline Phosphatase: 107 U/L (ref 39–117)
BUN: 12 mg/dL (ref 6–23)
Creatinine, Ser: 0.95 mg/dL (ref 0.4–1.2)
Potassium: 4.9 mEq/L (ref 3.5–5.1)
Total Protein: 6 g/dL (ref 6.0–8.3)

## 2010-05-02 LAB — LIPID PANEL
Cholesterol: 167 mg/dL (ref 0–200)
HDL: 49 mg/dL (ref >39)
LDL Cholesterol: 99 mg/dL (ref 0–99)
Total CHOL/HDL Ratio: 3.4 RATIO

## 2010-05-03 ENCOUNTER — Inpatient Hospital Stay (HOSPITAL_COMMUNITY): Payer: 59

## 2010-05-03 LAB — BASIC METABOLIC PANEL
BUN: 15 mg/dL (ref 6–23)
Calcium: 8 mg/dL — ABNORMAL LOW (ref 8.4–10.5)
GFR calc non Af Amer: >60 mL/min (ref >60)
Glucose, Bld: 129 mg/dL — ABNORMAL HIGH (ref 70–99)
Potassium: 4.7 mEq/L (ref 3.5–5.1)

## 2010-05-03 LAB — CBC
HCT: 49 % — ABNORMAL HIGH (ref 36.0–46.0)
MCHC: 30.2 g/dL (ref 30.0–36.0)
MCV: 98.8 fL (ref 78.0–100.0)
RDW: 15.7 % — ABNORMAL HIGH (ref 11.5–15.5)

## 2010-05-07 LAB — CULTURE, BLOOD (ROUTINE X 2)
Culture  Setup Time: 201204022347
Culture: NO GROWTH

## 2010-05-08 NOTE — H&P (Signed)
NAMESPARKLES, MCNEELY           ACCOUNT NO.:  [id number]  MEDICAL RECORD NO.:  [id number]           PATIENT TYPE:  E  LOCATION:  WLED                         FACILITY:  [facility]  PHYSICIAN:  Lonia Blood, M.D.DATE OF BIRTH:  1970/02/11  DATE OF ADMISSION:  05/01/2010 DATE OF DISCHARGE:                             HISTORY & PHYSICAL   PRIMARY CARE PHYSICIAN:  Lovenia Kim, D.O.  CHIEF COMPLAINT:  Shortness of breath with hypoxia.  HISTORY PRESENT ILLNESS:  [name] is a pleasant, 40 year old female with the medical history noted below.  Her family has noted that she has appeared to be progressively more short of breath over the last 2-3 months.  The patient herself has not noticed any significant change until Friday of last week.  It is presently Monday.  The patient states that beginning Friday she appreciated significant dyspnea on exertion.  She felt that she was wheezing some, but then again states that "I wheeze all the time anyway."  She denies any chest pain.  She has had episodes of diaphoresis and possibly a subjective fever but has not actually measured her temperature at home.  She has not had a significant cough nor has she had productive sputum.  She does not have orthopnea.  She has noted some swelling in her legs and feet, and this appears to be primarily dependent upon her posture.  She has had similar episodes of dyspnea in the past, but they were usually accompanied by more constitutional symptoms suggesting an actual pneumonia.  She denies any recent change in her medications or changes in her diet.  REVIEW OF SYSTEMS:  The patient reports that she has gained at least 40 pounds over the last 4 months.  She attributes this to increased intake and decreased physical activity.  A comprehensive review of systems is otherwise unremarkable.  PAST MEDICAL HISTORY: 1. Morbid obesity. 2. Obesity hypoventilation syndrome with sleep apnea - The  patient     wears a CPAP q.h.s. 3. Depression. 4. "Asthma" 5. Bipolar disorder.  OUTPATIENT MEDICATIONS: 1. Vitamin C 500 mg daily. 2. Multivitamin one p.o. daily. 3. ProAir 90 mcg inhaler, 2 puffs every 4 hours as needed. 4. Clonazepam 1 mg t.i.d. 5. Alprazolam 1 mg t.i.d. 6. Lamictal 200 mg in the morning and 200 mg in the evening. 7. Zoloft 300 mg q.a.m. 8. Microgestin 21, 1.5/30 1 tablet every p.m. 9. Methylphenidate 30 mg in the a.m., 30 mg at noon and 20 mg in the     p.m. 10.Saphris 20 mg q.p.m.  ALLERGIES:  No known drug allergies.  FAMILY HISTORY:  Reviewed with patient, but noncontributory to this admission.  SOCIAL HISTORY:  The patient is married.  She lives in the [place] area.  She does not smoke.  She does not drink.  She does not use illicit drugs.  She is currently unemployed.  DATA REVIEW:  BMET is unremarkable, but a measured bicarbonate is not available, and a serum glucose is noted to be elevated at 185.  White count is elevated 13.5 with an elevated hemoglobin of 16.2 and an MCV of 96 with a normal  platelet count.  BNP is elevated at 566.  D-dimer is elevated 5.48.  Chest x-ray reveals poorly defined nodular densities in bilateral lungs.  CT angiogram of the chest reveals a moderate right- sided pleural effusion and 2 cm pleural-based density in the left lateral mid-lung region consistent with a possible loculated effusion with no adenopathy or pulmonary embolism.  Cardiomegaly is noted. Recommendation is made for followup imaging after the patient has received medical care to assure that the area in the left lung is in fact a loculated effusion.  PHYSICAL EXAMINATION:  Temperature 99.1, blood pressure 148/89, heart rate 95, respiratory 18, O2 saturations 100% on 4 liters per minute. GENERAL:  Morbidly obese female who is somewhat anxious and diaphoretic, but in no acute respiratory distress. HEENT - Normocephalic, atraumatic.  Pupils equal round  react to light and accommodation.  Extraocular muscles intact bilaterally.  OC/OP clear. NECK - JVD is not able to be appreciated due to the patient's morbid obesity and short neck. LUNGS - Breath sounds are distant in all fields.  There is no appreciable wheeze, but air movement is difficult to assess due to the patient's body habitus - There are no focal crackles, but again these would be quite easy to miss in this patient. CARDIOVASCULAR - Heart sounds are quite distant, and I am not able to appreciate a murmur, but again this would be quite difficult to detect in this obese patient. ABDOMEN - Morbidly obese, soft, bowel sounds are present.  There is no organomegaly appreciable, but it is difficult to assess in this morbidly obese patient. EXTREMITIES - 2+ bilateral lower extremity edema to above the knee. NEUROLOGIC - Anxious, but alert female who is oriented x3 who is able to follow commands and displays 4+ to 5/5 strength in bilateral upper and lower extremities - Neuro exam is nonfocal.  IMPRESSION AND PLAN: 1. Hypoxia - [name] is suffering with significant hypoxia.     Her O2 saturations on room air were 80%.  She has required 4 liters     of O2 to get her O2 saturations into the 95-100 range.  Her x-rays     are not entirely revealing.  She does have an elevated white count,     and some of her symptoms are consistent with a possible pneumonia.     I do feel that empiric coverage for community-acquired pathogens is     reasonable, and therefore, I will dose her with Levaquin.  She also     could be suffering with a spell of acute bronchitis in the setting     of true reactive airway disease/asthma.  This will be yet another     indication for antibiotics.  I will ask that a sputum sample be     obtained for culture.  I will ask that blood cultures be assessed.     We must also consider the possibility of pulmonary edema.  This     could be due to right heart failure  with poor venous drainage of     the lungs or it could be due to left heart failure in an obese     patient.  We will assess this with an echocardiogram.  A follow-up     BNP will also be assessed in the morning.  I will diurese the     patient using a judicious dose of Lasix IV q.12 hours.  We will     follow her Is and Os strictly  and follow daily weights.  We will     recheck her BNP after she has been diuresed.  Once this patient has     stabilized, she would benefit from outpatient PFTs to further     delineate the actual nature of her lung disease.  This may have     been carried out before, but if not, it would be advisable.     Hopefully, we will be able to elucidate the exact cause of the     patient's hypoxia so that we can focus our care on this particular     cause. 2. Possible community-acquired pneumonia - See discussion above. 3. Possible right-sided versus left-sided heart failure - See     discussion above. 4. Hyperglycemia - The patient's hyperglycemia is not surprising.  She     does not have a known history of diabetes.  We will check a     hemoglobin A1c and proceed as indicated based upon the results of     this test. 5. Polycythemia - The patient's elevated hemoglobin count suggest that she may in fact been hypoxic for quite some time now.  We will     check a repeat CBC in the morning.  If her hemoglobin remains     elevated, we may need to consider a more lengthy evaluation. 6. Obesity hypoventilation syndrome/sleep apnea - The patient     reportedly uses CPAP on a q.h.s. basis.  I wonder if she is     compliant.  We will check an echocardiogram to evaluate for right     heart failure as discussed above.  We will continue her CPAP     nightly as per her home settings. 7. Morbid obesity - We will counsel the patient in regard to an     appropriate diet to assist her in weight loss.  We will counsel her     other weight loss strategies to include gentle  progressive     exercise. 8. Bipolar disorder with apparently a significant anxiety component -     We will continue all of the patient's outpatient medications as     provided by the list given to [place] by her family at her ER     presentation.     Lonia Blood, M.D.     JTM/MEDQ  D:  05/01/2010  T:  05/01/2010  Job:  [id number]  cc:   Lovenia Kim, D.O. Fax: [phone]  Electronically Signed by Jetty Duhamel M.D. on 05/08/2010 05:32:57 PM

## 2010-05-08 NOTE — Discharge Summary (Signed)
[name], VORIS           ACCOUNT NO.:  [id number]  MEDICAL RECORD NO.:  [id number]           PATIENT TYPE:  I  LOCATION:  1436                         FACILITY:  [facility]  PHYSICIAN:  Hollice Espy, M.D.DATE OF BIRTH:  1970-05-31  DATE OF ADMISSION:  05/01/2010 DATE OF DISCHARGE:  05/03/2010                              DISCHARGE SUMMARY   PRIMARY CARE PHYSICIAN:  Lovenia Kim, DO  CONSULTANTS:  Charlaine Dalton. Sherene Sires, MD, [facility], Pulmonary Medicine and the patient will be following up with Dr. Elisabeth Most as well as with [facility].  DISCHARGE DIAGNOSES: 1. Hypoxemia, multifactorial felt to be secondary to #2. 2. Acute systolic congestive heart failure brought on by #3. 3. Cor pulmonale. 4. Obstructive lung disease. 5. Acute episode of bronchitis. 6. Pleural effusion, felt to be secondary to inflammatory infectious     process brought on by bronchitis as well as by congestive heart     failure. 7. Morbid obesity. 8. History of bipolar disorder. 9. History of diabetes mellitus, confirmed by A1c, appeared to be also     a new diagnosis. 10.History of asthma. 11.History of obesity-hypoventilation syndrome. 12.History of depression.  DISCHARGE MEDICATIONS:  Discharge medications for this patient are as follows: 1. New medicine Lasix 40 mg, the patient will 80 mg p.o. b.i.d. for 2     days and decrease to 40 mg p.o. b.i.d. 2. K-Dur 40 mEq, she will take 2 pills for the next 2 days and then 1     pill p.o. daily. 3. Lisinopril 2.5 p.o. daily. 4. Levaquin 750 p.o. daily for 5 days.  She will continue on Xanax 1     mg p.o. t.i.d. p.r.n. 5. Clonazepam 1 mg p.o. t.i.d. p.r.n. 6. DayQuil 2 caps p.o. daily p.r.n. 7. Vicodin 5/500 one p.o. as needed for pain. 8. Ibuprofen 800 every 8 hours as needed for pain. 9. Icy hot patch transdermally for leg soreness as needed. 10.Lamictal 200 p.o. b.i.d. 11.Microgestin 21 1.5/30 one tablet p.o. q.h.s. 12.Ritalin 30 mg in  the morning, 30 mg at noon, 20 mg in the evening. 13.Multivitamin p.o. daily at lunch. 14.NyQuil LiquiCaps 2 caps daily. 15.ProAir inhaler 2 puffs every 4 hours as needed. 16.Asenapine 20 mg under the tongue every morning, 1000 p.o. q.6 h.     p.r.n. 17.Visine for dry and itchy eyes 1 drop in both eyes as needed. 18.Vitamin C 500 p.o. daily. 19.Zoloft 300 p.o. daily.  HOSPITAL COURSE:  The patient is a 40 year old white female with past medical history of obesity-hypoventilation syndrome, obstructive sleep apnea, bipolar disorder, and asthma who presented with shortness of breath.  This was felt to be multifactorial.  She was found to have signs of volume overloaded.  Her BNP was elevated at 500 and likely with her obesity was probably much higher than this.  She was also noted to have an elevated white count of 15.  A chest x-ray noted signs of effusion and there was a question of a loculated effusion.  The patient was started on IV antibiotics as well as diuretics.  By hospital day #2, she was starting to breathe more comfortably.  Because of her loculated effusion, I asked Pulmonary Medicine to weigh in and see this, the patient did not look septic.  Dr. Sherene Sires from Pulmonary Medicine evaluated the patient and felt that the fluid was difficult to tap and in addition, she would respond well to Aldactone.  So in regard  to the patient's bronchitis, we treated with antibiotics.  Her white count has continued to improve.  She has remained afebrile.  Her white count is down to 13 today.  The plan will be for her to go home on Levaquin for 5 more days for a total of 7 days of therapy.  In regard to her congestive heart failure, a 2D echo was done showing some minor decrease in her ejection fraction 45% to 50%.  She has remained on diuretics.  We plan to continue diuretics at home.  We have provided her with CHF information.  We will also have an RN followup as well.  The patient has also  been started on lisinopril 2.5 mg.  Her blood pressure has actually been relatively stable.  With the addition of diuretics, I am concerned about dropping her blood pressure too low, however, it will be best practice if we can put her on ACE inhibitor, which her renal function can tolerate and then in addition, I have contacted [facility] on-call for unassigned cardiology and they will contact the patient on May 04, 2010, for outpatient followup.  In regards to the patient's shortness of breath, she still remained hypoxemic, although her symptoms have much improved.  She is currently on oxygen.  When ambulating her, oxygen saturations dropped to the low-to-mid 80s on room air while ambulating and she is unsteady on her feet.  We will plan to set up home health PT, OT, and RN for followup as well as set up with home oxygen. Likely as her conditioning improves, we will be able to get her off oxygen ideally.  In regard to other medical issues, these were stable. She had an A1c checked, which was found to be 6.7.  The patient has no previous history of diabetes documented and she is not on any diabetic medication.  It was noted that her fasting sugars were around 140. Given her diagnosis of congestive heart failure, I am not comfortable putting the patient on metformin.  At this time, I am going to recommend diet and lifestyle changes and notify her PCP for followup if the patient does require diabetic medications in the future.  We will plan to check a followup chest x-ray prior to discharge to ensure that her effusions are not worse, but she otherwise looks to be doing quite well. For history of sleep apnea, she will continue on her nightly CPAP.  I have counseled the patient and her parents extensively about slow commitment to exercise as well as proper diet, watching her sodium intake and daily weight checks.  The patient's disposition is improved.  Activity will be as per home  health PT, OT.  Discharge diet is a heart-healthy diet.  She will follow up with Dr. Marisue Brooklyn in 2 weeks and [facility] will call her to set for outpatient appointment.     Hollice Espy, M.D.     SKK/MEDQ  D:  05/03/2010  T:  05/04/2010  Job:  [id number]  cc:   [facility]  Charlaine Dalton. Sherene Sires, MD, FCCP [address] [place] [address]  Lovenia Kim, D.O. Fax: [phone]  Electronically Signed  by Virginia Rochester M.D. on 05/08/2010 01:58:24 PM

## 2010-05-09 ENCOUNTER — Ambulatory Visit: Payer: PRIVATE HEALTH INSURANCE | Admitting: Psychology

## 2010-05-09 NOTE — Consult Note (Signed)
NAMECHUDNEY, SCHEFFLER           ACCOUNT NO.:  [id number]  MEDICAL RECORD NO.:  [id number]           PATIENT TYPE:  I  LOCATION:  1436                         [facility]ACILITY[facility]   [facility]  PHYSICIAN:  Casimiro Needle B. Sherene Sires, MD, FCCPDATE OF BIRTH:  August 23, 1970  DATE OF CONSULTATION: DATE OF DISCHARGE:                                CONSULTATION   HISTORY:  This is an exceptionally complicated 40 year old obese white female who states she has a history of asthma but comes in now with a different pattern of dyspnea that has progressed over the last several months associated with bilateral leg swelling, minimal subjective wheeze and no cough, which she says is unusual for her.  She has had orthopnea and bilateral symmetrical leg swelling and her workup after she was admitted on May 01, 2010, included a CT scan showing a right pleural effusion.  Therefore, Pulmonary was asked to see her.  She has had only minimal fever.  No significant new myalgias or arthralgias, difficulty swallowing or recent dental work.  She states she is feeling only marginally better since admission and placed on oxygen for the first time.  PAST MEDICAL HISTORY: 1. Morbid obesity with more than 75 pounds of weight gain over the     last 5 years complicated by obstructive sleep apnea. 2. Bipolar disorder. 3. Chronic asthma.  Outpatient medicines include ProAir every 4 hours as needed, clonazepam, alprazolam, Ritalin, and Saphris.  ALLERGIES:  None known.  SOCIAL HISTORY:  She has never smoked.  She lives in [place].  She denies any illicit drug use.  FAMILY HISTORY:  Negative for respiratory diseases.  REVIEW OF SYSTEMS:  Taken in detail.  All systems negative except above as mentioned.  PHYSICAL EXAMINATION:  GENERAL:  This is a massively obese white female with somewhat of cushingoid facies. VITAL SIGNS:  She is afebrile on vital signs and has had no fever during hospitalization. HEENT:  Unremarkable.  Pharynx  clear. NECK:  Supple without cervical adenopathy or tenderness.  Trachea is midline.  No thyromegaly. LUNGS:  Lung fields reveal diminished breath sound bilaterally.  No true wheeze. HEART:  There is regular rhythm without murmur, gallop, or rub. ABDOMEN:  Soft, benign.  S1 and S2 are very diminished. EXTREMITIES:  Warm without calf tenderness, cyanosis or clubbing.  There was 1 to 2+ pitting edema bilaterally with no calf tenderness. NEUROLOGIC:  No focal deficits.  LABORATORY DATA:  Lab data was remarkable for an admission hemoglobin of 16 and a bicarb level of 33.  TSH was normal.  BNP was 566.  An echocardiogram shows evidence of moderate PAH with a pulmonary systolic pressure around 55.  Her serum albumin is 2.0.  CT scan as noted above there is a small to moderate right pleural effusion.  IMPRESSION AND RECOMMENDATIONS: 1. Morbid obesity complicated by not only obstructive sleep apnea but     also likely by obesity hypoventilation syndrome, perhaps     exacerbated by the use of benzodiazepines chronically and     associated with approximately 100 pounds of weight gain over the     last 5 years.  In this setting, CPAP may  not be enough and she may     need BiPAP as well as judicious use of benzodiazepines to prevent     further hypercarbia.  If this fails, tracheostomy is the only     solution 2. The right pleural effusion I believe is secondary to right greater     than left heart failure in the setting of very low serum albumin,     probably related to poor protein intake and perhaps fatty steatosis     of the liver.  This should be controllable with Aldactone added to     her present regimen of Lasix and a sed rate will be obtained to see     if we can detect any significant inflammatory component to the     effusion.  However, technically it would be very difficult to     perform thoracentesis given her body habitus and I would avoid this     if at all possible and follow  this conservatively.     Charlaine Dalton. Sherene Sires, MD, [facility]     MBW/MEDQ  D:  05/02/2010  T:  05/03/2010  Job:  [id number]  cc:   Lovenia Kim, D.O. Fax: [phone]  Dr. Darnelle Spangle, M.D.  Electronically Signed by Sandrea Hughs MD FCCP on 05/09/2010 08:30:42 PM

## 2010-05-16 ENCOUNTER — Ambulatory Visit (INDEPENDENT_AMBULATORY_CARE_PROVIDER_SITE_OTHER): Payer: PRIVATE HEALTH INSURANCE | Admitting: Psychology

## 2010-05-16 DIAGNOSIS — F319 Bipolar disorder, unspecified: Secondary | ICD-10-CM

## 2010-05-23 ENCOUNTER — Ambulatory Visit (INDEPENDENT_AMBULATORY_CARE_PROVIDER_SITE_OTHER): Payer: PRIVATE HEALTH INSURANCE | Admitting: Psychology

## 2010-05-23 DIAGNOSIS — F319 Bipolar disorder, unspecified: Secondary | ICD-10-CM

## 2010-05-30 ENCOUNTER — Encounter: Payer: Self-pay | Admitting: *Deleted

## 2010-05-30 ENCOUNTER — Encounter: Payer: Self-pay | Admitting: Pulmonary Disease

## 2010-05-30 ENCOUNTER — Ambulatory Visit (INDEPENDENT_AMBULATORY_CARE_PROVIDER_SITE_OTHER): Payer: PRIVATE HEALTH INSURANCE | Admitting: Psychology

## 2010-05-30 DIAGNOSIS — F319 Bipolar disorder, unspecified: Secondary | ICD-10-CM

## 2010-06-01 ENCOUNTER — Ambulatory Visit (INDEPENDENT_AMBULATORY_CARE_PROVIDER_SITE_OTHER)
Admission: RE | Admit: 2010-06-01 | Discharge: 2010-06-01 | Disposition: A | Discharge status: Home / Self Care | Payer: PRIVATE HEALTH INSURANCE | Source: Ambulatory Visit | Attending: Pulmonary Disease | Admitting: Pulmonary Disease

## 2010-06-01 ENCOUNTER — Encounter: Payer: Self-pay | Admitting: Pulmonary Disease

## 2010-06-01 ENCOUNTER — Ambulatory Visit (INDEPENDENT_AMBULATORY_CARE_PROVIDER_SITE_OTHER): Payer: 59 | Admitting: Pulmonary Disease

## 2010-06-01 DIAGNOSIS — J90 Pleural effusion, not elsewhere classified: Secondary | ICD-10-CM

## 2010-06-01 DIAGNOSIS — E662 Morbid (severe) obesity with alveolar hypoventilation: Secondary | ICD-10-CM

## 2010-06-01 DIAGNOSIS — I509 Heart failure, unspecified: Secondary | ICD-10-CM

## 2010-06-01 DIAGNOSIS — G4733 Obstructive sleep apnea (adult) (pediatric): Secondary | ICD-10-CM

## 2010-06-01 NOTE — Progress Notes (Signed)
  Subjective:    Patient ID: [name], female    DOB: 02-Jan-1971, 40 y.o.   MRN: [id number]  HPI  39/F with morbid obesity, obstructive sleep apnea , chronic respiratory failure & r pleural effusion presents for post hospital visit. She also has DM-2 & bipolar [name]) She was admitted for worsening dyspnea & wt gain from 4/2-4/12 & seen by Dr Sherene Sires in consultation. ABG was 7.40/49/63 on 4 L [place]. Echo shoed EF 50% with mild decrease in systolic function. BNP > 500 & CXR showed a rt pleural effusion. CT angio neg for PE She was diuresed > 50 lbs, discharged on O2 ([place]) with which she is compliant. BNP decreased to 242 & BUN/ cr was 15/0.8. Judicious use of benzos was advised in v/o hypercarbia. PSG in '02 showed severe obstructive sleep apnea with AHI 40/h, lowest desatn to 52% correctd by 8 cm H2O . Her wt was 325 lbs then. Lab work has been done at PCP since discharge      Review of Systems  Constitutional: Positive for unexpected weight change. Negative for fever.  HENT: Negative for ear pain, nosebleeds, congestion, sore throat, rhinorrhea, sneezing, trouble swallowing, dental problem, postnasal drip and sinus pressure.   Eyes: Negative for redness and itching.  Respiratory: Positive for shortness of breath. Negative for cough, chest tightness and wheezing.   Cardiovascular: Negative for palpitations and leg swelling.  Gastrointestinal: Negative for nausea and vomiting.  Genitourinary: Negative for dysuria.  Musculoskeletal: Negative for joint swelling.  Skin: Negative for rash.  Neurological: Negative for headaches.  Hematological: Does not bruise/bleed easily.  Psychiatric/Behavioral: Positive for dysphoric mood. The patient is nervous/anxious.        Objective:   Physical Exam Gen. Pleasant, obese, in no distress, normal affect ENT - no lesions, no post nasal drip Neck: No JVD, no thyromegaly, no carotid bruits Lungs: no use of accessory muscles, no dullness to  percussion, decreased BS  without rales or rhonchi  Cardiovascular: Rhythm regular, heart sounds  normal, no murmurs or gallops, 1+ peripheral edema (improved) Abdomen: soft and non-tender, no hepatosplenomegaly, BS normal. Musculoskeletal: No deformities, no cyanosis or clubbing Neuro:  alert, non focal        Assessment & Plan:

## 2010-06-01 NOTE — Patient Instructions (Signed)
Ambulatory satn Sleep study for CPAP titration Chest xray today Cardiology appointment to evaluate echo We will contact [facility] to see if they have sleep study on file

## 2010-06-02 ENCOUNTER — Telehealth: Payer: Self-pay | Admitting: Pulmonary Disease

## 2010-06-02 NOTE — Telephone Encounter (Signed)
Spoke with pt and she is aware of the appointment with Dr. Antoine Poche on 07/06/10 at 3:30. Pt to arrive at 3:15. Pt is aware of appt date, time and location. Dr. Vassie Loll will be ordering cpap titration study on pt and she will have to check with her husband to see what day of the week would be best for her. Pt given my phone number for her to call me back with this information. Once I hear from her I will scheduled study.

## 2010-06-05 DIAGNOSIS — E662 Morbid (severe) obesity with alveolar hypoventilation: Secondary | ICD-10-CM | POA: Insufficient documentation

## 2010-06-05 DIAGNOSIS — J90 Pleural effusion, not elsewhere classified: Secondary | ICD-10-CM | POA: Insufficient documentation

## 2010-06-05 DIAGNOSIS — G4733 Obstructive sleep apnea (adult) (pediatric): Secondary | ICD-10-CM | POA: Insufficient documentation

## 2010-06-05 NOTE — Assessment & Plan Note (Signed)
Severe based on PSG in '02 (325 lbs ) Rpt CPAP +O2  Titration due to wt gain &c linical worsening. Question is whether she will need BiPAP due to hypoventialtion. Will review raw data from PSG & clinical course to decide. I suspect her fluid retention was systolic heart failure rather than cor pulmonale.

## 2010-06-05 NOTE — Assessment & Plan Note (Signed)
Improved on CXR, related to lV dysfunction, lost 50 lbs & BNP improved with diuresis Refer to cards for evaluation of systolic heart failure Further diuretics to be guided by weight & cards evaluaiton, ct until then

## 2010-06-05 NOTE — Telephone Encounter (Signed)
CPAP Titration Study order and scheduled for Sunday 07/02/10 at 8:00 pm. Sleep packet mailed to pt. Spoke with pt and she is aware of date, time and location of study. Original study obtained. Appt scheduled for her return visit to see Dr. Vassie Loll by front schedulers and pt is aware of this appt date, and time.

## 2010-06-06 ENCOUNTER — Ambulatory Visit (INDEPENDENT_AMBULATORY_CARE_PROVIDER_SITE_OTHER): Payer: PRIVATE HEALTH INSURANCE | Admitting: Psychology

## 2010-06-06 DIAGNOSIS — F319 Bipolar disorder, unspecified: Secondary | ICD-10-CM

## 2010-06-08 NOTE — Telephone Encounter (Signed)
Error

## 2010-06-12 ENCOUNTER — Ambulatory Visit (HOSPITAL_BASED_OUTPATIENT_CLINIC_OR_DEPARTMENT_OTHER)
Admission: RE | Admit: 2010-06-12 | Payer: PRIVATE HEALTH INSURANCE | Source: Ambulatory Visit | Admitting: Orthopedic Surgery

## 2010-06-13 ENCOUNTER — Ambulatory Visit (INDEPENDENT_AMBULATORY_CARE_PROVIDER_SITE_OTHER): Payer: PRIVATE HEALTH INSURANCE | Admitting: Psychology

## 2010-06-13 DIAGNOSIS — F319 Bipolar disorder, unspecified: Secondary | ICD-10-CM

## 2010-06-14 ENCOUNTER — Encounter: Payer: Self-pay | Admitting: Pulmonary Disease

## 2010-06-14 ENCOUNTER — Encounter (HOSPITAL_BASED_OUTPATIENT_CLINIC_OR_DEPARTMENT_OTHER): Payer: 59 | Attending: Plastic Surgery

## 2010-06-14 DIAGNOSIS — L03319 Cellulitis of trunk, unspecified: Secondary | ICD-10-CM | POA: Insufficient documentation

## 2010-06-14 DIAGNOSIS — L02219 Cutaneous abscess of trunk, unspecified: Secondary | ICD-10-CM | POA: Insufficient documentation

## 2010-06-15 NOTE — Assessment & Plan Note (Signed)
[facility]  NAME:  [name], [name]           ACCOUNT NO.:  [id number]  MEDICAL RECORD NO.:  [id number]      DATE OF BIRTH:  04/20/1970  PHYSICIAN:  Wayland Denis, DO            VISIT DATE:                                  OFFICE VISIT   [name] is a 40 year old white female who is markedly overweight and recently hospitalized with respiratory issues.  She complains of firmness in the right groin area and states at some point in the last 2 months she was placed on Levaquin and then doxycycline which ended a few days ago.  This was for possible abscess in the groin area.  There was some drainage at one point but not as of the last several days.  She does not have any open area in the skin and has not really been doing any particular dressing changes or care of the area.  The antibiotics did help.  Apparently, cultures were done at some point which helped and showed some improvement.  PAST MEDICAL HISTORY: 1. Obstructive lung disease. 2. Hypoxemia with home O2 required. 3. Bronchitis. 4. Morbid obesity. 5. Depression. 6. Bipolar. 7. Diabetes. 8. Asthma. 9. Hypoventilation syndrome. 10.Cardiac disease.  Her medications include vitamin C, multivitamin, ProAir, clonazepam, alprazolam, Lamictal, sertraline, Microgestin, methylphenidate, Lasix, potassium, tramadol.  No known drug allergies.  FAMILY HISTORY:  Noncontributory.  SOCIAL HISTORY:  Mom helps give her care.  Currently at home, not working.  On physical exam, she is alert, oriented, cooperative, in no acute distress.  She seems to be a good historian.  Her pupils are equal and reactive.  Her extraocular muscles are intact.  No cervical lymphadenopathy.  Lungs are clear.  Heart is regular.  Abdomen excess tissue but no tenderness.  Lower extremity pulses are strong.  The area in question in the right groin is hyperpigmented.  No cellulitis.  No obvious fluctuance or isolated abscess.   There is some hardness that is slightly tender.  I see there was an area where it had drained which is now healed.  There is no specific area to drain at this time.  ASSESSMENT:  Possible abscess right groin.  PLAN:  Continued antibiotics, sitz baths, warm soaks, protein intake increase and vitamins, and we will see her back in followup.     Wayland Denis, DO     CS/MEDQ  D:  06/14/2010  T:  06/15/2010  Job:  [id number]

## 2010-06-20 ENCOUNTER — Ambulatory Visit (INDEPENDENT_AMBULATORY_CARE_PROVIDER_SITE_OTHER): Payer: PRIVATE HEALTH INSURANCE | Admitting: Psychology

## 2010-06-20 DIAGNOSIS — F319 Bipolar disorder, unspecified: Secondary | ICD-10-CM

## 2010-06-27 ENCOUNTER — Ambulatory Visit (INDEPENDENT_AMBULATORY_CARE_PROVIDER_SITE_OTHER): Payer: PRIVATE HEALTH INSURANCE | Admitting: Psychology

## 2010-06-27 DIAGNOSIS — F319 Bipolar disorder, unspecified: Secondary | ICD-10-CM

## 2010-06-30 ENCOUNTER — Other Ambulatory Visit: Payer: Self-pay | Admitting: Pulmonary Disease

## 2010-06-30 DIAGNOSIS — G4733 Obstructive sleep apnea (adult) (pediatric): Secondary | ICD-10-CM

## 2010-07-02 ENCOUNTER — Ambulatory Visit (HOSPITAL_BASED_OUTPATIENT_CLINIC_OR_DEPARTMENT_OTHER): Payer: 59 | Attending: Pulmonary Disease

## 2010-07-02 DIAGNOSIS — F319 Bipolar disorder, unspecified: Secondary | ICD-10-CM | POA: Insufficient documentation

## 2010-07-02 DIAGNOSIS — Z79899 Other long term (current) drug therapy: Secondary | ICD-10-CM | POA: Insufficient documentation

## 2010-07-02 DIAGNOSIS — G4733 Obstructive sleep apnea (adult) (pediatric): Secondary | ICD-10-CM | POA: Insufficient documentation

## 2010-07-02 DIAGNOSIS — E119 Type 2 diabetes mellitus without complications: Secondary | ICD-10-CM | POA: Insufficient documentation

## 2010-07-02 DIAGNOSIS — Z6841 Body Mass Index (BMI) 40.0 and over, adult: Secondary | ICD-10-CM | POA: Insufficient documentation

## 2010-07-04 ENCOUNTER — Telehealth: Payer: Self-pay | Admitting: Pulmonary Disease

## 2010-07-04 ENCOUNTER — Encounter: Payer: Self-pay | Admitting: Pulmonary Disease

## 2010-07-04 ENCOUNTER — Ambulatory Visit (INDEPENDENT_AMBULATORY_CARE_PROVIDER_SITE_OTHER): Payer: PRIVATE HEALTH INSURANCE | Admitting: Psychology

## 2010-07-04 DIAGNOSIS — F319 Bipolar disorder, unspecified: Secondary | ICD-10-CM

## 2010-07-04 DIAGNOSIS — E119 Type 2 diabetes mellitus without complications: Secondary | ICD-10-CM

## 2010-07-04 DIAGNOSIS — G4733 Obstructive sleep apnea (adult) (pediatric): Secondary | ICD-10-CM

## 2010-07-04 DIAGNOSIS — Z9989 Dependence on other enabling machines and devices: Secondary | ICD-10-CM

## 2010-07-04 NOTE — Progress Notes (Signed)
  Subjective:    Patient ID: [name], female    DOB: October 13, 1970, 40 y.o.   MRN: [id number]  HPI CPAP titration showed requirement of auto 10-16 , no O2 required Will send orders to DME    Review of Systems     Objective:   Physical Exam        Assessment & Plan:   This encounter was created in error - please disregard.

## 2010-07-04 NOTE — Telephone Encounter (Signed)
PSG showed requirement of pressure upto 16 cm No oxygen ok during sleep Order sent to DME

## 2010-07-05 NOTE — Procedures (Signed)
[name], [name]           ACCOUNT NO.:  [id number]  MEDICAL RECORD NO.:  [id number]          PATIENT TYPE:  OUT  LOCATION:  SLEEP CENTER                 FACILITY:  [facility]  PHYSICIAN:  Oretha Milch, MD      DATE OF BIRTH:  February 28, 1970  DATE OF STUDY:  07/02/2010                           NOCTURNAL POLYSOMNOGRAM  REFERRING PHYSICIAN:  Charm Stenner V. [place] Maulden  INDICATIONS FOR STUDY:  Known obstructive sleep apnea in this morbidly obese woman with chronic respiratory failure, diabetes, and bipolar disorder.  After recent admission, she has been maintained on 3 L nasal cannula.  A polysomnogram in 2002 had shown severe obstructive sleep apnea with an AHI of 40 events per hour and the lowest desaturation of 52% corrected by 8 cm of water.  Her weight was 325 pounds then.  At the time of this study, she weighed 347 pounds with a height of 5 feet 5 inches, BMI of 58, and neck size of 17 inches.  EPWORTH SLEEPINESS SCORE:  Epworth sleepiness score was 2.  MEDICATIONS:  Bedtime medications included zinc, clonazepam, alprazolam, Lamictal, and Microgestin.  This CPAP titration study was performed with a sleep technologist in attendance.  EEG, EOG, EMG, EKG, respiratory parameters, limb movements, and respiratory parameters were recorded.  Sleep stages arousals, limb movements, and respiratory data were scored according criteria laid out by the American Academy of Sleep Medicine.  SLEEP ARCHITECTURE:  Lights out was at 10:04 p.m. and lights on was at 4:54 a.m.  Total sleep time was 310 minutes with a sleep period of 371 minutes, sleep efficiency of 76%, sleep latency was 37 minutes, latency to REM sleep was 203 minutes, and wake after sleep onset was 65 minutes. Sleep stages as a percentage of total sleep time was N1 21%, N2 74%, N3 0%, REM sleep 5% (15 minutes).  Supine sleep accounted for 310 minutes. REM sleep was noted at one stage around 2:00 a.m.  AROUSAL DATA:  There were 160 arousals  with an arousal index of 30 events per hour.  Of these, 122 were spontaneous and the rest were associated with respiratory events.  RESPIRATORY DATA:  CPAP was initiated at 5 cm and titrated to a final level of 16 cm at a level of 9 cm for 97 minutes including 15 minutes of REM sleep and 15 hypopneas were noted.  The patient developed these events in the supine position and hence CPAP was titrated further at a final level of 16-17 cm for 72 minutes of non-REM sleep.  One obstructive apnea and 10 hypopneas were noted with an apnea/hypopnea index of 9 events per hour and lowest desaturation of 86%.  This appears to be optimal level used during this study.  OXYGEN DATA:  The desaturation index was 24 events per hour.  Lowest desaturation was 81% and on CPAP of 16 cm, the lowest desaturation was 86%.  She spent 9 minutes with saturation less than 88%.  She did not require oxygen during the study.  CARDIAC DATA:  No arrhythmias were noted.  Low heart rate was 32 beats per minute and the high heart rate recorded was an artifact.  Discussion- she was desensitized with a small Quattro  mask.  Note that, slow-wave sleep was absent which may be an effect of medications.  The CPAP of 9 cm seem to control events in the nonsupine position, however, she required higher pressure of 16 cm in the supine position.  She did not meet oxygen protocol during CPAP titration.  MOVEMENT-PARASOMNIA:  The limb movement index was 7 events per hour. Limb movement arousal index, however, was 1.5 events per hour.  </IMPRESSIONS- 1. Severe obstructive sleep apnea with hypopneas causing sleep     fragmentation and oxygen desaturation. 2. This was corrected by CPAP of 9 cm in the nonsupine position and 16     cm in the supine position with a small full-face mask and humidity.     No oxygen was required. 3. No evidence of cardiac arrhythmias or behavioral disturbance during     sleep. 4. Few periodic limb  movements were noted associated with arousals.  RECOMMENDATIONS/> Treatment options for this degree of sleep disorder breathing include: 1. CPAP of 16 cm with a small full-face mask.  Alternatively, an auto     CPAP of 10-16 cm can be used.  No oxygen is required. 2. She should be cautioned against medications sedative side effects     and driving when sleepy.     Oretha Milch, MD Electronically Signed    RVA/MEDQ  D:  07/04/2010 11:13:53  T:  07/05/2010 01:06:57  Job:  [id number]

## 2010-07-06 ENCOUNTER — Ambulatory Visit (INDEPENDENT_AMBULATORY_CARE_PROVIDER_SITE_OTHER): Payer: 59 | Admitting: Cardiology

## 2010-07-06 ENCOUNTER — Encounter: Payer: Self-pay | Admitting: Cardiology

## 2010-07-06 VITALS — BP 116/84 | HR 93 | Ht 65.0 in | Wt 350.0 lb

## 2010-07-06 DIAGNOSIS — E669 Obesity, unspecified: Secondary | ICD-10-CM

## 2010-07-06 DIAGNOSIS — G4733 Obstructive sleep apnea (adult) (pediatric): Secondary | ICD-10-CM

## 2010-07-06 DIAGNOSIS — I5022 Chronic systolic (congestive) heart failure: Secondary | ICD-10-CM | POA: Insufficient documentation

## 2010-07-06 DIAGNOSIS — I428 Other cardiomyopathies: Secondary | ICD-10-CM

## 2010-07-06 DIAGNOSIS — I429 Cardiomyopathy, unspecified: Secondary | ICD-10-CM | POA: Insufficient documentation

## 2010-07-06 NOTE — Assessment & Plan Note (Signed)
She already uses CPAP. She is due to have followup for recent markedly abnormal sleep study.

## 2010-07-06 NOTE — Progress Notes (Signed)
HPI This patient presents for evaluation of a mildly reduced ejection fraction. She was hospitalized in April and I have reviewed all of these records. She had multifactorial shortness of breath. She was felt to have exacerbation of asthma, probable hypoventilation obesity syndrome, sleep apnea, COPD and some element of heart failure. She did have an echocardiogram which demonstrated some global hypokinesis with an EF of 45%.  Since that hospitalization she has been on oxygen as needed. She is wearing at this appointment. However, she actually thinks her breathing has improved. She is able to disimpact any light housekeeping. With this she is not getting any chest pressure, neck or arm discomfort. He's not had any palpitations, presyncope or syncope. She's had no PND or orthopnea. She did have significant weight gain and improved with diuresis and she remains on diuretics post hospital. She is trying to watch her salt and fluid intake.  She has been doing some walking.  No Known Allergies  Current Outpatient Prescriptions  Medication Sig Dispense Refill  . albuterol (PROAIR HFA) 108 (90 BASE) MCG/ACT inhaler Inhale 2 puffs into the lungs every 4 (four) hours as needed.        . ALPRAZolam (XANAX) 1 MG tablet Take 1 mg by mouth 3 (three) times daily as needed.        . AMOXICILLIN PO Take by mouth as directed.        . Ascorbic Acid (VITAMIN C) 500 MG tablet Take 500 mg by mouth daily.        . clonazePAM (KLONOPIN) 1 MG tablet Take 1 mg by mouth 3 (three) times daily as needed.        . furosemide (LASIX) 40 MG tablet Take 40 mg by mouth 2 (two) times daily.        [name] ibuprofen (ADVIL,MOTRIN) 800 MG tablet Take 800 mg by mouth every 8 (eight) hours as needed.        . lamoTRIgine (LAMICTAL) 200 MG tablet Take 200 mg by mouth 2 (two) times daily.        . methylphenidate (RITALIN) 10 MG tablet Take 1 1/2 tabs by mouth in the morning, 1 1/2 tabs by mouth at noon and 1 tabs at 4pm      . Multiple Vitamin  (MULTIVITAMIN) tablet Take 1 tablet by mouth daily.        . norethindrone-ethinyl estradiol-iron (MICROGESTIN FE 1.5/30) 1.5-30 MG-MCG tablet Take 1 tablet by mouth at bedtime.        . potassium chloride SA (KLOR-CON M20) 20 MEQ tablet 2 tabs by mouth once daily       . Pseudoeph-Doxylamine-DM-APAP (NYQUIL PO) Take by mouth as needed.        . Pseudoephedrine-APAP-DM (DAYQUIL PO) Take by mouth as needed.        . sertraline (ZOLOFT) 100 MG tablet 3 tabs by mouth once daily       . tetrahydrozoline (VISINE) 0.05 % ophthalmic solution Place 1 drop into both eyes as needed.        . traMADol (ULTRAM) 50 MG tablet Take 1 tablet by mouth 4 times daily as needed.      [name] DISCONTD: Asenapine Maleate 10 MG SUBL 20 mg under the tongue every night      . DISCONTD: doxycycline (VIBRA-TABS) 100 MG tablet Take 1 tablet by mouth Twice daily.      [name] DISCONTD: HYDROcodone-acetaminophen (VICODIN) 5-500 MG per tablet Take 1 tablet by mouth every 6 (six) hours as needed.        [name]  DISCONTD: lisinopril (PRINIVIL,ZESTRIL) 2.5 MG tablet Take 2.5 mg by mouth daily.        [name] DISCONTD: Menthol, Topical Analgesic, (ICY HOT BACK) 5 % PADS Apply topically as needed.          Past Medical History  Diagnosis Date  . Morbid obesity   . Obesity hypoventilation syndrome   . Depression   . Asthma   . Bipolar disorder   . Hypoxemia   . Acute systolic congestive heart failure   . Cor pulmonale   . History of pleural effusion   . Diabetes mellitus   . Obstructive lung disease     Past Surgical History  Procedure Date  . Wisdom tooth extraction     ROS:  As stated in the HPI and negative for all other systems.  PHYSICAL EXAM BP 116/84  Pulse 93  Ht 5\' 5"  (1.651 m)  Wt 350 lb (158.759 kg)  BMI 58.24 kg/m2 GENERAL:  Well appearing HEENT:  Pupils equal round and reactive, fundi not visualized, oral mucosa unremarkable NECK:  No jugular venous distention, waveform within normal limits, carotid upstroke brisk and  symmetric, no bruits, no thyromegaly LYMPHATICS:  No cervical, inguinal adenopathy LUNGS:  Clear to auscultation bilaterally BACK:  No CVA tenderness CHEST:  Unremarkable HEART:  PMI not displaced or sustained,S1 and S2 within normal limits, no S3, no S4, no clicks, no rubs, no murmurs ABD:  Flat, positive bowel sounds normal in frequency in pitch, no bruits, no rebound, no guarding, no midline pulsatile mass, no hepatomegaly, no splenomegaly, morbidly obese EXT:  2 plus pulses throughout, no edema, no cyanosis no clubbing SKIN:  No rashes no nodules NEURO:  Cranial nerves II through XII grossly intact, motor grossly intact throughout PSYCH:  Cognitively intact, oriented to person place and time  EKG:  Sinus rhythm, rate 93, axis within normal limits, intervals within normal limits, poor anterior R-wave progression  ASSESSMENT AND PLAN

## 2010-07-06 NOTE — Assessment & Plan Note (Signed)
She has a mildly reduced ejection fraction. I suspect this is nonischemic. I will order a pharmacologic perfusion study to further evaluate. I would like to try a low dose of an ARB if her pulmonologist is not suspect this would exacerbate her asthma.

## 2010-07-06 NOTE — Assessment & Plan Note (Signed)
We discussed the [employer].

## 2010-07-06 NOTE — Patient Instructions (Signed)
You are being scheduled for a lexiscan stress test.  Please follow the instruction sheet given.  You will be called with the results. Please continue your current medications as listed Follow up with Dr Antoine Poche in 3 months

## 2010-07-07 ENCOUNTER — Ambulatory Visit (INDEPENDENT_AMBULATORY_CARE_PROVIDER_SITE_OTHER): Payer: 59 | Admitting: Pulmonary Disease

## 2010-07-07 ENCOUNTER — Other Ambulatory Visit (INDEPENDENT_AMBULATORY_CARE_PROVIDER_SITE_OTHER): Payer: 59

## 2010-07-07 ENCOUNTER — Encounter: Payer: Self-pay | Admitting: Pulmonary Disease

## 2010-07-07 VITALS — BP 130/80 | HR 92 | Temp 98.1°F | Ht 65.0 in | Wt 353.2 lb

## 2010-07-07 DIAGNOSIS — E662 Morbid (severe) obesity with alveolar hypoventilation: Secondary | ICD-10-CM

## 2010-07-07 DIAGNOSIS — I5022 Chronic systolic (congestive) heart failure: Secondary | ICD-10-CM

## 2010-07-07 DIAGNOSIS — J90 Pleural effusion, not elsewhere classified: Secondary | ICD-10-CM

## 2010-07-07 DIAGNOSIS — G4733 Obstructive sleep apnea (adult) (pediatric): Secondary | ICD-10-CM

## 2010-07-07 LAB — BASIC METABOLIC PANEL
Calcium: 9 mg/dL (ref 8.4–10.5)
Chloride: 100 mEq/L (ref 96–112)
Creatinine, Ser: 0.6 mg/dL (ref 0.4–1.2)

## 2010-07-07 NOTE — Patient Instructions (Signed)
Your cpap has been set to automode 10-16 cm, pl send in the card so we can review download in 79month Blood work today Ambulatory satn - did show drop in level , stay on oxygen

## 2010-07-07 NOTE — Progress Notes (Signed)
  Subjective:    Patient ID: [name], female    DOB: 1970-02-05, 40 y.o.   MRN: [id number]  HPI  PCP [name] 39/F with morbid obesity for FU of obstructive sleep apnea , chronic respiratory failure & r pleural effusion. She also has DM-2 & bipolar [name])  She was admitted for worsening dyspnea & wt gain from 4/2-4/12 & seen by Dr Sherene Sires in consultation.  ABG was 7.40/49/63 on 4 L [place]. Echo shoed EF 50% with mild decrease in systolic function. BNP > 500 & CXR showed a rt pleural effusion. CT angio neg for PE  She was diuresed > 50 lbs, discharged on O2 ([facility]) with which she is compliant. BNP decreased to 242 & BUN/ cr was 15/0.8. Judicious use of benzos was advised in v/o hypercarbia.  PSG in '02 showed severe obstructive sleep apnea with AHI 40/h, lowest desatn to 52% correctd by 8 cm H2O . Her wt was 325 lbs then.  Lab work has been done at PCP since discharge   07/07/2010 Wt 353 Reviewed CPAP titration study >> required 10-16cm CPAP, higher pressure  when supine/ REM sleep. Oxygen not required, O2 satn dropped on 3rd lap around the office,. Breathing better ,has not required rescue MDI  Seen by Cards >> pharmacological stress test planned. BMET - renal fn & potassium ok on 40 mg bid lasix    Review of Systems Pt denies any significant  nasal congestion or excess secretions, fever, chills, sweats, unintended wt loss, pleuritic or exertional cp, orthopnea pnd or leg swelling.  Pt also denies any obvious fluctuation in symptoms with weather or environmental change or other alleviating or aggravating factors.    Pt denies any increase in rescue therapy over baseline, denies waking up needing it or having early am exacerbations or coughing/wheezing/ or dyspnea       Objective:   Physical Exam Gen. Pleasant, obese, in no distress ENT - no lesions, no post nasal drip Neck: No JVD, no thyromegaly, no carotid bruits Lungs: no use of accessory muscles, no dullness to  percussion, clear without rales or rhonchi  Cardiovascular: Rhythm regular, heart sounds  normal, no murmurs or gallops, no peripheral edema Musculoskeletal: No deformities, no cyanosis or clubbing          Assessment & Plan:

## 2010-07-10 ENCOUNTER — Encounter: Payer: Self-pay | Admitting: *Deleted

## 2010-07-10 NOTE — Assessment & Plan Note (Signed)
OK to stay off O2 at rest & during sleep. Use o2 when walking long distances.

## 2010-07-10 NOTE — Assessment & Plan Note (Signed)
Start on autoCPAP 10-16 given varibaility with body position. Weight loss encouraged, compliance with goal of at least 4-6 hrs every night is the expectation. Advised against medications with sedative side effects Cautioned against driving when sleepy - understanding that sleepiness will vary on a day to day basis

## 2010-07-10 NOTE — Assessment & Plan Note (Signed)
Stay on lasix 40 mg bid - until further clarification from dr hochrein after stress test

## 2010-07-11 ENCOUNTER — Ambulatory Visit (INDEPENDENT_AMBULATORY_CARE_PROVIDER_SITE_OTHER): Payer: PRIVATE HEALTH INSURANCE | Admitting: Psychology

## 2010-07-11 DIAGNOSIS — F319 Bipolar disorder, unspecified: Secondary | ICD-10-CM

## 2010-07-11 NOTE — Telephone Encounter (Signed)
lmomtcb x 1, also has results on labs. (normal)

## 2010-07-12 NOTE — Telephone Encounter (Signed)
Spoke to [email or web address]  they have received order for auto cpap and have not called pt yet but will do so today pt is aware of this

## 2010-07-12 NOTE — Telephone Encounter (Signed)
Called and spoke with pt.  Informed her of lab results and order for DME.  Pt states she was already aware of the order for DME and that she spoke with Bjorn Loser about this last week but states [employer] still hasn't called pt regarding this order.  Will forward message to [facility] regarding getting in touch with [facility] regarding this.

## 2010-07-12 NOTE — Telephone Encounter (Signed)
Patient phoned stated she was returning a call to [name] pt can be reached at [phone].[name]

## 2010-07-17 ENCOUNTER — Telehealth: Payer: Self-pay | Admitting: *Deleted

## 2010-07-17 DIAGNOSIS — I509 Heart failure, unspecified: Secondary | ICD-10-CM

## 2010-07-17 NOTE — Telephone Encounter (Signed)
Called pt to discuss the need to start Cozaar 12.5 mg a day per Dr Fayrene Fearing [name]'s orders.  No answer at the number listed X 20 rings.  Will contact to attempt to contact her

## 2010-07-18 ENCOUNTER — Ambulatory Visit (INDEPENDENT_AMBULATORY_CARE_PROVIDER_SITE_OTHER): Payer: PRIVATE HEALTH INSURANCE | Admitting: Psychology

## 2010-07-18 DIAGNOSIS — F319 Bipolar disorder, unspecified: Secondary | ICD-10-CM

## 2010-07-19 ENCOUNTER — Encounter (HOSPITAL_BASED_OUTPATIENT_CLINIC_OR_DEPARTMENT_OTHER): Payer: 59

## 2010-07-19 ENCOUNTER — Ambulatory Visit (HOSPITAL_COMMUNITY): Payer: 59 | Attending: Cardiology | Admitting: Radiology

## 2010-07-19 VITALS — Ht 65.0 in | Wt 356.0 lb

## 2010-07-19 DIAGNOSIS — R0602 Shortness of breath: Secondary | ICD-10-CM

## 2010-07-19 DIAGNOSIS — R079 Chest pain, unspecified: Secondary | ICD-10-CM

## 2010-07-19 DIAGNOSIS — I5022 Chronic systolic (congestive) heart failure: Secondary | ICD-10-CM

## 2010-07-19 MED ORDER — REGADENOSON 0.4 MG/5ML IV SOLN
0.4000 mg | Freq: Once | INTRAVENOUS | Status: AC
  Administered 2010-07-19: 0.4 mg via INTRAVENOUS

## 2010-07-19 MED ORDER — TECHNETIUM TC 99M TETROFOSMIN IV KIT
33.0000 | PACK | Freq: Once | INTRAVENOUS | Status: AC | PRN
  Administered 2010-07-19: 33 via INTRAVENOUS

## 2010-07-19 NOTE — Progress Notes (Addendum)
[facility] SITE [address] NUCLEAR MED [address] [place] [address] [phone]  Cardiology Nuclear Med Study  [name] is a 39 y.o. female [id number] 08-21-1970   Nuclear Med Background Indication for Stress Test:  Evaluation for Ischemia History:  Asthma, COPD, 05-02-10 Echo: EF 45% and Global Hypokinesis Cardiac Risk Factors: NIDDM and Obesity  Symptoms:  Chest Pain, DOE and SOB   Nuclear Pre-Procedure Caffeine/Decaff Intake:  None NPO After: 10:30pm   Lungs:  clear IV 0.9% NS with Angio Cath:  22g  IV Site: R Antecubital  IV Started by:  Irean Hong, RN  Chest Size (in):  48 Cup Size: DD  Height: 5\' 5"  (1.651 m)  Weight:  356 lb (161.481 kg)  BMI:  Body mass index is 59.24 kg/(m^2). Tech Comments:  N/A    Nuclear Med Study 1 or 2 day study: 2 day  Stress Test Type:  Eugenie Birks  Reading MD: Willa Rough, MD  Order Authorizing Provider:  J.[name]  Resting Radionuclide: Technetium 2m Tetrofosmin  Resting Radionuclide Dose: 33 mCi   Stress Radionuclide:  Technetium 37m Tetrofosmin  Stress Radionuclide Dose: 33 mCi           Stress Protocol Rest HR: 86 Stress HR: 99  Rest BP: 127/90 Stress BP: 146/95  Exercise Time (min): n/a METS: n/a   Predicted Max HR: 180 bpm % Max HR: 55 bpm Rate Pressure Product: 95284   Dose of Adenosine (mg):  n/a Dose of Lexiscan: 0.4 mg  Dose of Atropine (mg): n/a Dose of Dobutamine: n/a mcg/kg/min (at max HR)  Stress Test Technologist: Milana Na, EMT-P  Nuclear Technologist:  Domenic Polite, CNMT     Rest Procedure:  Myocardial perfusion imaging was performed at rest 45 minutes following the intravenous administration of Technetium 73m Tetrofosmin. Rest ECG: NSR  Stress Procedure:  The patient received IV Lexiscan 0.4 mg over 15-seconds.  Technetium 64m Tetrofosmin injected at 30-seconds.  There were no significant changes with Lexiscan.  Quantitative spect images were obtained after a 45 minute  delay. Stress ECG: No significant change from baseline ECG  QPS Raw Data Images:  Normal; no motion artifact; normal heart/lung ratio. Stress Images:  Normal homogeneous uptake in all areas of the myocardium. Rest Images:  Normal homogeneous uptake in all areas of the myocardium. Subtraction (SDS):  SDS 3 Transient Ischemic Dilatation (Normal <1.22): .81  Lung/Heart Ratio (Normal <0.45):  .45   Quantitative Gated Spect Images QGS EDV:  201 ml QGS ESV:  116 ml QGS cine images:  Diffuse hypokinesis worse in the anterior base QGS EF: 42%  Impression Exercise Capacity:  Lexiscan with no exercise. BP Response:  Normal blood pressure response. Clinical Symptoms:  There is dyspnea. ECG Impression:  No significant ST segment change suggestive of ischemia. Comparison with Prior Nuclear Study: No images to compare  Overall Impression:  Small area of moderate anteroapical wall ischemia.  EF 42% Overall poor quality study.  Suggest further w/u given decreased EF  [name]   This is a low risk scan.  The patient is at acceptable risk for a nerve block and finger surgery.  I am going to start low dose ARB for treatment of the low EF.  I do not feel that cath is indicated based on the lack of angina.  However, I will move her appt up to the next few weeks to discuss further.  [name]

## 2010-07-20 ENCOUNTER — Ambulatory Visit (HOSPITAL_COMMUNITY): Payer: PRIVATE HEALTH INSURANCE | Attending: Cardiology | Admitting: Radiology

## 2010-07-20 MED ORDER — TECHNETIUM TC 99M TETROFOSMIN IV KIT
33.0000 | PACK | Freq: Once | INTRAVENOUS | Status: AC | PRN
  Administered 2010-07-20: 33 via INTRAVENOUS

## 2010-07-21 NOTE — Progress Notes (Signed)
Nuclear report for review [name] H  

## 2010-07-24 ENCOUNTER — Telehealth: Payer: Self-pay | Admitting: Cardiology

## 2010-07-24 ENCOUNTER — Telehealth: Payer: Self-pay | Admitting: Pulmonary Disease

## 2010-07-24 MED ORDER — LOSARTAN POTASSIUM 25 MG PO TABS: ORAL_TABLET | ORAL | Status: DC

## 2010-07-24 NOTE — Telephone Encounter (Signed)
Pt aware of need to start Cozaar 12.5 mg a day

## 2010-07-24 NOTE — Telephone Encounter (Signed)
Pt is aware of results.  Needs cardiac clearance ASAP for surgery on finger.  Will forward to Dr Antoine Poche for recommendations

## 2010-07-24 NOTE — Telephone Encounter (Signed)
Pt had a stress test and she has been waiting to have surgery on finger and she still has not gotten the clearance and she was wondering what is the status

## 2010-07-24 NOTE — Telephone Encounter (Signed)
I informed pt of RA's findings and recommendations. Pt verbalized understanding  

## 2010-07-24 NOTE — Progress Notes (Signed)
Quick Note:  I informed pt of RA's findings and recommendations. Pt verbalized understanding  ______ 

## 2010-07-25 ENCOUNTER — Telehealth: Payer: Self-pay | Admitting: *Deleted

## 2010-07-25 ENCOUNTER — Encounter: Payer: Self-pay | Admitting: *Deleted

## 2010-07-25 ENCOUNTER — Ambulatory Visit (INDEPENDENT_AMBULATORY_CARE_PROVIDER_SITE_OTHER): Payer: PRIVATE HEALTH INSURANCE | Admitting: Psychology

## 2010-07-25 DIAGNOSIS — F319 Bipolar disorder, unspecified: Secondary | ICD-10-CM

## 2010-07-25 NOTE — Telephone Encounter (Signed)
Pt needs note faxed to her dentist Dr Cherlynn Perches at [phone].  He needs to know if it is OK to use nitrous oxide sedation, if there are any epinephrine restrictions and if it is OK to use injectable anesthetics.  Will forward to Dr Antoine Poche for recommendations.

## 2010-07-25 NOTE — Telephone Encounter (Signed)
Verbal order per Dr Antoine Poche - pt OK for dental work with any of the procedures listed.

## 2010-07-25 NOTE — Telephone Encounter (Signed)
Per Dr Antoine Poche - OK for surgery.  Please see myoview results for clearance

## 2010-07-25 NOTE — Progress Notes (Signed)
Pt aware of results. Clearance for surgery will be faxed and her appt moved up per Dr Antoine Poche.

## 2010-08-01 ENCOUNTER — Telehealth: Payer: Self-pay | Admitting: Cardiology

## 2010-08-01 ENCOUNTER — Ambulatory Visit (INDEPENDENT_AMBULATORY_CARE_PROVIDER_SITE_OTHER): Payer: PRIVATE HEALTH INSURANCE | Admitting: Psychology

## 2010-08-01 DIAGNOSIS — F319 Bipolar disorder, unspecified: Secondary | ICD-10-CM

## 2010-08-01 NOTE — Telephone Encounter (Signed)
Faxed EKG & Stress to Loraine at [facility] ([phone]).

## 2010-08-04 ENCOUNTER — Encounter (HOSPITAL_BASED_OUTPATIENT_CLINIC_OR_DEPARTMENT_OTHER)
Admission: RE | Admit: 2010-08-04 | Discharge: 2010-08-04 | Disposition: A | Discharge status: Home / Self Care | Payer: 59 | Source: Ambulatory Visit | Attending: Orthopedic Surgery | Admitting: Orthopedic Surgery

## 2010-08-04 LAB — POCT I-STAT, CHEM 8
HCT: 43 % (ref 36.0–46.0)
Hemoglobin: 14.6 g/dL (ref 12.0–15.0)
Potassium: 3.9 mEq/L (ref 3.5–5.1)
Sodium: 139 mEq/L (ref 135–145)

## 2010-08-07 ENCOUNTER — Ambulatory Visit (HOSPITAL_BASED_OUTPATIENT_CLINIC_OR_DEPARTMENT_OTHER)
Admission: RE | Admit: 2010-08-07 | Discharge: 2010-08-07 | Disposition: A | Discharge status: Home / Self Care | Payer: 59 | Source: Ambulatory Visit | Attending: Orthopedic Surgery | Admitting: Orthopedic Surgery

## 2010-08-07 ENCOUNTER — Other Ambulatory Visit: Payer: Self-pay | Admitting: Orthopedic Surgery

## 2010-08-07 DIAGNOSIS — G4733 Obstructive sleep apnea (adult) (pediatric): Secondary | ICD-10-CM | POA: Insufficient documentation

## 2010-08-07 DIAGNOSIS — J45909 Unspecified asthma, uncomplicated: Secondary | ICD-10-CM | POA: Insufficient documentation

## 2010-08-07 DIAGNOSIS — F319 Bipolar disorder, unspecified: Secondary | ICD-10-CM | POA: Insufficient documentation

## 2010-08-07 DIAGNOSIS — Z01812 Encounter for preprocedural laboratory examination: Secondary | ICD-10-CM | POA: Insufficient documentation

## 2010-08-07 DIAGNOSIS — D2110 Benign neoplasm of connective and other soft tissue of unspecified upper limb, including shoulder: Secondary | ICD-10-CM | POA: Insufficient documentation

## 2010-08-07 DIAGNOSIS — Z9981 Dependence on supplemental oxygen: Secondary | ICD-10-CM | POA: Insufficient documentation

## 2010-08-07 HISTORY — PX: MASS EXCISION: SHX2000

## 2010-08-07 LAB — GRAM STAIN

## 2010-08-08 ENCOUNTER — Ambulatory Visit (INDEPENDENT_AMBULATORY_CARE_PROVIDER_SITE_OTHER): Payer: PRIVATE HEALTH INSURANCE | Admitting: Psychology

## 2010-08-08 DIAGNOSIS — F319 Bipolar disorder, unspecified: Secondary | ICD-10-CM

## 2010-08-09 LAB — WOUND CULTURE

## 2010-08-11 ENCOUNTER — Ambulatory Visit (INDEPENDENT_AMBULATORY_CARE_PROVIDER_SITE_OTHER): Payer: 59 | Admitting: Cardiology

## 2010-08-11 ENCOUNTER — Encounter: Payer: Self-pay | Admitting: Cardiology

## 2010-08-11 DIAGNOSIS — E669 Obesity, unspecified: Secondary | ICD-10-CM

## 2010-08-11 DIAGNOSIS — I5022 Chronic systolic (congestive) heart failure: Secondary | ICD-10-CM

## 2010-08-11 DIAGNOSIS — G4733 Obstructive sleep apnea (adult) (pediatric): Secondary | ICD-10-CM

## 2010-08-11 DIAGNOSIS — E662 Morbid (severe) obesity with alveolar hypoventilation: Secondary | ICD-10-CM

## 2010-08-11 DIAGNOSIS — I428 Other cardiomyopathies: Secondary | ICD-10-CM

## 2010-08-11 DIAGNOSIS — I509 Heart failure, unspecified: Secondary | ICD-10-CM

## 2010-08-11 MED ORDER — LOSARTAN POTASSIUM 25 MG PO TABS: 25.0000 mg | ORAL_TABLET | Freq: Every day | ORAL | Status: DC

## 2010-08-11 NOTE — Assessment & Plan Note (Signed)
Today I will increase the Cozaar to 25 mg.  The goal will be 100 mg.  Also, if her asthma allows I will use a beta blocker in the future.

## 2010-08-11 NOTE — Patient Instructions (Signed)
Please increase your Cozaar to 25 mg one tablet a day.  Continue all other medications as listed,  Follow up with Dr Antoine Poche in 4 to 6 weeks.

## 2010-08-11 NOTE — Progress Notes (Signed)
HPI This patient presents for evaluation of a mildly reduced ejection fraction. She was hospitalized in April.  She had multifactorial shortness of breath. She was felt to have exacerbation of asthma, probable hypoventilation obesity syndrome, sleep apnea, COPD and some element of heart failure. She did have an echocardiogram which demonstrated some global hypokinesis with an EF of 45%.    At the last visit I ordered a stress perfusion study which did not suggest obstructive coronary disease. I started her on an ARB. She tolerated this. She thinks her breathing is a little bit better. She is wearing the oxygen less. She's not having any new cardiovascular symptoms.  The patient denies any new symptoms such as chest discomfort, neck or arm discomfort. There has been no new shortness of breath, PND or orthopnea. There have been no reported palpitations, presyncope or syncope.  No Known Allergies  Current Outpatient Prescriptions  Medication Sig Dispense Refill  . albuterol (PROAIR HFA) 108 (90 BASE) MCG/ACT inhaler Inhale 2 puffs into the lungs every 4 (four) hours as needed.        . ALPRAZolam (XANAX) 1 MG tablet Take 1 mg by mouth 3 (three) times daily as needed.        . Ascorbic Acid (VITAMIN C) 500 MG tablet Take 500 mg by mouth daily.        . clonazePAM (KLONOPIN) 1 MG tablet Take 1 mg by mouth 3 (three) times daily as needed.        . furosemide (LASIX) 40 MG tablet Take 40 mg by mouth 2 (two) times daily.        [name] HYDROcodone-acetaminophen (VICODIN) 5-500 MG per tablet As  directed      . ibuprofen (ADVIL,MOTRIN) 800 MG tablet Take 800 mg by mouth every 8 (eight) hours as needed.        . lamoTRIgine (LAMICTAL) 200 MG tablet Take 200 mg by mouth 2 (two) times daily.        [name] losartan (COZAAR) 25 MG tablet 1/2 tablet a day  30 tablet  6  . methylphenidate (RITALIN) 10 MG tablet Take 1 1/2 tabs by mouth in the morning, 1 1/2 tabs by mouth at noon and 1 tabs at 4pm      . Multiple Vitamin  (MULTIVITAMIN) tablet Take 1 tablet by mouth daily.        . norethindrone-ethinyl estradiol-iron (MICROGESTIN FE 1.5/30) 1.5-30 MG-MCG tablet Take 1 tablet by mouth at bedtime.        . potassium chloride SA (KLOR-CON M20) 20 MEQ tablet Take 20 mEq by mouth daily.       [name] PREVIDENT 5000 PLUS 1.1 % CREA As directed      . Pseudoeph-Doxylamine-DM-APAP (NYQUIL PO) Take by mouth as needed.        . Pseudoephedrine-APAP-DM (DAYQUIL PO) Take by mouth as needed.        . sertraline (ZOLOFT) 100 MG tablet 2 tabs by mouth once daily      . tetrahydrozoline (VISINE) 0.05 % ophthalmic solution Place 1 drop into both eyes as needed.        . traMADol (ULTRAM) 50 MG tablet Take 1 tablet by mouth 4 times daily as needed.      [name] ZINC SULFATE PO Take by mouth daily.          Past Medical History  Diagnosis Date  . Morbid obesity   . Obesity hypoventilation syndrome   . Depression   . Asthma   .  Bipolar disorder   . Hypoxemia   . Acute systolic congestive heart failure   . Cor pulmonale   . History of pleural effusion   . Diabetes mellitus   . Obstructive lung disease     Past Surgical History  Procedure Date  . Wisdom tooth extraction     ROS:  As stated in the HPI and negative for all other systems.  PHYSICAL EXAM BP 132/82  Pulse 80  Ht 5\' 5"  (1.651 m)  Wt 353 lb (160.12 kg)  BMI 58.74 kg/m2 GENERAL:  Well appearing HEENT:  Pupils equal round and reactive, fundi not visualized, oral mucosa unremarkable NECK:  No jugular venous distention, waveform within normal limits, carotid upstroke brisk and symmetric, no bruits, no thyromegaly LYMPHATICS:  No cervical, inguinal adenopathy LUNGS:  Clear to auscultation bilaterally BACK:  No CVA tenderness CHEST:  Unremarkable HEART:  PMI not displaced or sustained,S1 and S2 within normal limits, no S3, no S4, no clicks, no rubs, no murmurs ABD:  Flat, positive bowel sounds normal in frequency in pitch, no bruits, no rebound, no guarding, no  midline pulsatile mass, no hepatomegaly, no splenomegaly, morbidly obese EXT:  2 plus pulses throughout, no edema, no cyanosis no clubbing SKIN:  No rashes no nodules NEURO:  Cranial nerves II through XII grossly intact, motor grossly intact throughout PSYCH:  Cognitively intact, oriented to person place and time  EKG:  Sinus rhythm, rate 93, axis within normal limits, intervals within normal limits, poor anterior R-wave progression  ASSESSMENT AND PLAN

## 2010-08-11 NOTE — Assessment & Plan Note (Signed)
She understands that treatment of this will be paramount to treating her dyspnea.

## 2010-08-12 LAB — ANAEROBIC CULTURE

## 2010-08-15 ENCOUNTER — Ambulatory Visit (INDEPENDENT_AMBULATORY_CARE_PROVIDER_SITE_OTHER): Payer: PRIVATE HEALTH INSURANCE | Admitting: Psychology

## 2010-08-15 ENCOUNTER — Telehealth: Payer: Self-pay | Admitting: Cardiology

## 2010-08-15 DIAGNOSIS — F319 Bipolar disorder, unspecified: Secondary | ICD-10-CM

## 2010-08-15 DIAGNOSIS — I509 Heart failure, unspecified: Secondary | ICD-10-CM

## 2010-08-15 MED ORDER — LOSARTAN POTASSIUM 25 MG PO TABS: 25.0000 mg | ORAL_TABLET | Freq: Every day | ORAL | Status: DC

## 2010-08-15 NOTE — Telephone Encounter (Signed)
Cozaar 25 mg. Please clarify direction & dosage.  [facility] [phone].

## 2010-08-18 NOTE — Op Note (Signed)
  NAMEKONNI, KESINGER NO.:  [id number]  MEDICAL RECORD NO.:  [id number]  LOCATION:                                 FACILITY:  PHYSICIAN:  Cindee Salt, M.D.       DATE OF BIRTH:  27-Sep-1970  DATE OF PROCEDURE:  08/07/2010 DATE OF DISCHARGE:                              OPERATIVE REPORT   PREOPERATIVE DIAGNOSIS:  Mass, right index finger.  POSTOPERATIVE DIAGNOSIS:  Mass, right index finger.  OPERATION:  Excision of mass, right index finger with cultures.  SURGEON:  Cindee Salt, MD  ANESTHESIA:  Forearm based IV regional with local infiltration.  ANESTHESIOLOGIST:  Dr. Gelene Mink.  HISTORY:  The patient is a 40 year old female with a history of a mass over the radial aspect of her right index finger.  She has elected to undergo surgical excision.  Pre, peri and postoperative course have been discussed along with risks and complications.  She is aware there is no guarantee with surgery, possibility of infection, recurrence injury to arteries, nerves, tendons, incomplete relief of symptoms, dystrophy. She is aware that this may be nerve tumor that it may be blood vessel tumor.  It does not transilluminate.  Preoperative area, the patient is seen.  The extremity marked by both the patient and surgeon.  Antibiotic given.  PROCEDURE:  The patient was brought to the operating room where a forearm based IV regional anesthetic was carried out without difficulty. She was prepped using ChloraPrep, supine position with a right arm free. A 3-minute dry time was allowed.  Time-out taken confirming the patient and procedure.  We still had some feeling of metacarpal block, was given with 0.25% Marcaine without epinephrine approximately 10 mL was used. After adequate anesthesia was afforded, a mid lateral incision was made longitudinally in nature over the mass right index finger from the distal interphalangeal joint to the PIP joint and to the distal phalanx, carried  down through subcutaneous tissue.  A fully circumscribed mass was immediately encountered.  Blunt and sharp dissection.  This was dissected free taking care to protect the dorsal sensory nerve and the proper radial digital nerve and artery.  These were followed along the entire course.  The entire mass was excised and sent to pathology.  No further lesions were identified.  The wound was copiously irrigated with saline.  Cultures were taken of the mass along with a small tissue piece being sent.  The mass was quite firm, solid in nature.  Following irrigation, the wound was closed with interrupted 5-0 Vicryl Rapide sutures.  A sterile compressive dressing to the finger was applied.  Deflation of the tourniquet, remaining fingers turned pinked.  She was taken to the recovery room for observation in satisfactory condition.  She will be discharged to home to return to the [facility] of [place] in 1 week, on Vicodin.          ______________________________ Cindee Salt, M.D.     GK/MEDQ  D:  08/07/2010  T:  08/08/2010  Job:  [id number]  cc:   Lovenia Kim, D.O.  Electronically Signed by Cindee Salt M.D. on 08/18/2010 09:19:39 AM

## 2010-08-22 ENCOUNTER — Ambulatory Visit: Payer: PRIVATE HEALTH INSURANCE | Admitting: Psychology

## 2010-08-29 ENCOUNTER — Ambulatory Visit (INDEPENDENT_AMBULATORY_CARE_PROVIDER_SITE_OTHER): Payer: PRIVATE HEALTH INSURANCE | Admitting: Psychology

## 2010-08-29 DIAGNOSIS — F319 Bipolar disorder, unspecified: Secondary | ICD-10-CM

## 2010-09-05 ENCOUNTER — Ambulatory Visit (INDEPENDENT_AMBULATORY_CARE_PROVIDER_SITE_OTHER): Payer: PRIVATE HEALTH INSURANCE | Admitting: Psychology

## 2010-09-05 DIAGNOSIS — F319 Bipolar disorder, unspecified: Secondary | ICD-10-CM

## 2010-09-11 ENCOUNTER — Telehealth: Payer: Self-pay | Admitting: Pulmonary Disease

## 2010-09-11 DIAGNOSIS — G4733 Obstructive sleep apnea (adult) (pediatric): Secondary | ICD-10-CM

## 2010-09-11 NOTE — Telephone Encounter (Signed)
I spoke with the pt and she states she has been waiting to hear about download from July for her CPAP. Please advise.Carron Curie, CMA

## 2010-09-12 ENCOUNTER — Ambulatory Visit (INDEPENDENT_AMBULATORY_CARE_PROVIDER_SITE_OTHER): Payer: PRIVATE HEALTH INSURANCE | Admitting: Psychology

## 2010-09-12 DIAGNOSIS — F319 Bipolar disorder, unspecified: Secondary | ICD-10-CM

## 2010-09-12 NOTE — Telephone Encounter (Signed)
  Download 6/18- 08/13/10 Good control of events on CPAP 13 cm - send order to DME to change to this pressure. Large leak +, try to create better seal on mask Good compliance

## 2010-09-13 NOTE — Telephone Encounter (Signed)
Left detailed message of RA recs and placed order. Advised pt to call back if needed. Will sign off on phone note, to create new if pt calls back.

## 2010-09-14 ENCOUNTER — Encounter: Payer: Self-pay | Admitting: Pulmonary Disease

## 2010-09-14 ENCOUNTER — Telehealth: Payer: Self-pay | Admitting: Cardiology

## 2010-09-14 DIAGNOSIS — I509 Heart failure, unspecified: Secondary | ICD-10-CM

## 2010-09-14 NOTE — Telephone Encounter (Addendum)
Pt needs losartin 25mg  tab qd, and all other meds sent to [facility] pt ID number [id number] pt needs this asap she is out and needs a call when this has been called in so she will know and once this is done she also needs a 7 day supply [facility] to [facility] [address]

## 2010-09-15 MED ORDER — POTASSIUM CHLORIDE CRYS ER 20 MEQ PO TBCR: 20.0000 meq | EXTENDED_RELEASE_TABLET | Freq: Every day | ORAL | Status: DC

## 2010-09-15 MED ORDER — LOSARTAN POTASSIUM 25 MG PO TABS: 25.0000 mg | ORAL_TABLET | Freq: Every day | ORAL | Status: DC

## 2010-09-15 MED ORDER — FUROSEMIDE 40 MG PO TABS: 40.0000 mg | ORAL_TABLET | Freq: Two times a day (BID) | ORAL | Status: DC

## 2010-09-19 ENCOUNTER — Ambulatory Visit (INDEPENDENT_AMBULATORY_CARE_PROVIDER_SITE_OTHER): Payer: PRIVATE HEALTH INSURANCE | Admitting: Psychology

## 2010-09-19 DIAGNOSIS — F319 Bipolar disorder, unspecified: Secondary | ICD-10-CM

## 2010-09-26 ENCOUNTER — Encounter: Payer: Self-pay | Admitting: Pulmonary Disease

## 2010-09-26 ENCOUNTER — Ambulatory Visit (INDEPENDENT_AMBULATORY_CARE_PROVIDER_SITE_OTHER): Payer: PRIVATE HEALTH INSURANCE | Admitting: Psychology

## 2010-09-26 DIAGNOSIS — F319 Bipolar disorder, unspecified: Secondary | ICD-10-CM

## 2010-09-29 ENCOUNTER — Ambulatory Visit (INDEPENDENT_AMBULATORY_CARE_PROVIDER_SITE_OTHER): Payer: 59 | Admitting: Cardiology

## 2010-09-29 ENCOUNTER — Encounter: Payer: Self-pay | Admitting: Cardiology

## 2010-09-29 DIAGNOSIS — I428 Other cardiomyopathies: Secondary | ICD-10-CM

## 2010-09-29 DIAGNOSIS — I509 Heart failure, unspecified: Secondary | ICD-10-CM

## 2010-09-29 DIAGNOSIS — E669 Obesity, unspecified: Secondary | ICD-10-CM

## 2010-09-29 MED ORDER — LOSARTAN POTASSIUM 25 MG PO TABS: 25.0000 mg | ORAL_TABLET | Freq: Two times a day (BID) | ORAL | Status: DC

## 2010-09-29 NOTE — Patient Instructions (Signed)
Please increase your Cozaar to 25 mg one twice a day Continue all other medications as listed.  Please contact your primary care MD about your physical therapy.  If they can not order it - find out what type of PT was needed before, the diagnosis and where they were going to send you.  Follow up with Dr Antoine Poche in 2 months

## 2010-09-29 NOTE — Assessment & Plan Note (Signed)
We continue to discuss this.  I applaud her weight loss to date.  I prescribed the [facility] Diet.

## 2010-09-29 NOTE — Progress Notes (Signed)
Addended by: Sharin Grave on: 09/29/2010 05:14 PM   Modules accepted: Orders

## 2010-09-29 NOTE — Progress Notes (Signed)
HPI This patient presents for evaluation of a mildly reduced ejection fraction. At the last appt I increased Cozaar.  She tolerated this though she might be having mild increased dyspnea.  The patient denies any new symptoms such as chest discomfort, neck or arm discomfort. There has been no new shortness of breath, PND or orthopnea. There have been no reported palpitations, presyncope or syncope.  She has lost another six pounds.  She wants to restart PT which she was having after her respiratory failure earlier this year.   No Known Allergies  Current Outpatient Prescriptions  Medication Sig Dispense Refill  . albuterol (PROAIR HFA) 108 (90 BASE) MCG/ACT inhaler Inhale 2 puffs into the lungs every 4 (four) hours as needed.        . ALPRAZolam (XANAX) 1 MG tablet Take 1 mg by mouth 3 (three) times daily as needed.        . Ascorbic Acid (VITAMIN C) 500 MG tablet Take 500 mg by mouth daily.        . clonazePAM (KLONOPIN) 1 MG tablet Take 1 mg by mouth 3 (three) times daily as needed.        . furosemide (LASIX) 40 MG tablet Take 1 tablet (40 mg total) by mouth 2 (two) times daily.  90 tablet  3  . HYDROcodone-acetaminophen (VICODIN) 5-500 MG per tablet As  directed      . ibuprofen (ADVIL,MOTRIN) 800 MG tablet Take 800 mg by mouth every 8 (eight) hours as needed.        . lamoTRIgine (LAMICTAL) 200 MG tablet Take 200 mg by mouth 2 (two) times daily.        [name] losartan (COZAAR) 25 MG tablet Take 1 tablet (25 mg total) by mouth daily.  90 tablet  3  . methylphenidate (RITALIN) 10 MG tablet Take 1 1/2 tabs by mouth in the morning, 1 1/2 tabs by mouth at noon and 1 tabs at 4pm      . Multiple Vitamin (MULTIVITAMIN) tablet Take 1 tablet by mouth daily.        . norethindrone-ethinyl estradiol-iron (MICROGESTIN FE 1.5/30) 1.5-30 MG-MCG tablet Take 1 tablet by mouth at bedtime.        . potassium chloride SA (KLOR-CON M20) 20 MEQ tablet Take 1 tablet (20 mEq total) by mouth daily.  90 tablet  3  .  PREVIDENT 5000 PLUS 1.1 % CREA As directed      . sertraline (ZOLOFT) 100 MG tablet 2 tabs by mouth once daily      . tetrahydrozoline (VISINE) 0.05 % ophthalmic solution Place 1 drop into both eyes as needed.        . traMADol (ULTRAM) 50 MG tablet Take 1 tablet by mouth 4 times daily as needed.      [name] ZINC SULFATE PO Take by mouth daily.          Past Medical History  Diagnosis Date  . Morbid obesity   . Obesity hypoventilation syndrome   . Depression   . Asthma   . Bipolar disorder   . Hypoxemia   . Acute systolic congestive heart failure   . Cor pulmonale   . History of pleural effusion   . Diabetes mellitus   . Obstructive lung disease     Past Surgical History  Procedure Date  . Wisdom tooth extraction     ROS:  As stated in the HPI and negative for all other systems.  PHYSICAL EXAM BP 132/90  Pulse  80  Ht 5\' 5"  (1.651 m)  Wt 347 lb (157.398 kg)  BMI 57.74 kg/m2 GENERAL:  Well appearing HEENT:  Pupils equal round and reactive, fundi not visualized, oral mucosa unremarkable NECK:  No jugular venous distention, waveform within normal limits, carotid upstroke brisk and symmetric, no bruits, no thyromegaly LYMPHATICS:  No cervical, inguinal adenopathy LUNGS:  Clear to auscultation bilaterally BACK:  No CVA tenderness CHEST:  Unremarkable HEART:  PMI not displaced or sustained,S1 and S2 within normal limits, no S3, no S4, no clicks, no rubs, no murmurs ABD:  Flat, positive bowel sounds normal in frequency in pitch, no bruits, no rebound, no guarding, no midline pulsatile mass, no hepatomegaly, no splenomegaly, morbidly obese EXT:  2 plus pulses throughout, no edema, no cyanosis no clubbing SKIN:  No rashes no nodules NEURO:  Cranial nerves II through XII grossly intact, motor grossly intact throughout PSYCH:  Cognitively intact, oriented to person place and time  ASSESSMENT AND PLAN

## 2010-09-29 NOTE — Assessment & Plan Note (Signed)
Today I will increase her Cozaar to 25 mg bid.  I will slowly titrate this as her BP allows.

## 2010-10-03 ENCOUNTER — Ambulatory Visit (INDEPENDENT_AMBULATORY_CARE_PROVIDER_SITE_OTHER): Payer: PRIVATE HEALTH INSURANCE | Admitting: Psychology

## 2010-10-03 DIAGNOSIS — F319 Bipolar disorder, unspecified: Secondary | ICD-10-CM

## 2010-10-05 ENCOUNTER — Other Ambulatory Visit: Payer: Self-pay | Admitting: *Deleted

## 2010-10-05 DIAGNOSIS — I509 Heart failure, unspecified: Secondary | ICD-10-CM

## 2010-10-05 MED ORDER — LOSARTAN POTASSIUM 25 MG PO TABS: 25.0000 mg | ORAL_TABLET | Freq: Two times a day (BID) | ORAL | Status: DC

## 2010-10-10 ENCOUNTER — Ambulatory Visit (INDEPENDENT_AMBULATORY_CARE_PROVIDER_SITE_OTHER): Payer: PRIVATE HEALTH INSURANCE | Admitting: Psychology

## 2010-10-10 DIAGNOSIS — F319 Bipolar disorder, unspecified: Secondary | ICD-10-CM

## 2010-10-12 ENCOUNTER — Ambulatory Visit: Payer: 59 | Admitting: Cardiology

## 2010-10-26 ENCOUNTER — Ambulatory Visit: Payer: PRIVATE HEALTH INSURANCE | Admitting: Psychology

## 2010-10-31 ENCOUNTER — Ambulatory Visit (INDEPENDENT_AMBULATORY_CARE_PROVIDER_SITE_OTHER): Payer: PRIVATE HEALTH INSURANCE | Admitting: Psychology

## 2010-10-31 DIAGNOSIS — F319 Bipolar disorder, unspecified: Secondary | ICD-10-CM

## 2010-11-07 ENCOUNTER — Ambulatory Visit (INDEPENDENT_AMBULATORY_CARE_PROVIDER_SITE_OTHER): Payer: PRIVATE HEALTH INSURANCE | Admitting: Psychology

## 2010-11-07 DIAGNOSIS — F319 Bipolar disorder, unspecified: Secondary | ICD-10-CM

## 2010-11-09 ENCOUNTER — Encounter: Payer: Self-pay | Admitting: Pulmonary Disease

## 2010-11-09 ENCOUNTER — Ambulatory Visit (INDEPENDENT_AMBULATORY_CARE_PROVIDER_SITE_OTHER): Payer: 59 | Admitting: Pulmonary Disease

## 2010-11-09 DIAGNOSIS — E662 Morbid (severe) obesity with alveolar hypoventilation: Secondary | ICD-10-CM

## 2010-11-09 DIAGNOSIS — G4733 Obstructive sleep apnea (adult) (pediatric): Secondary | ICD-10-CM

## 2010-11-09 NOTE — Patient Instructions (Signed)
Congratulations - you are doing well Keep losing weight & continue using your CPAP We will check your oxygen levels during sleep & advise Flu shot- advised

## 2010-11-09 NOTE — Assessment & Plan Note (Signed)
chk ONO on RA/ CPAP to decide need for O2 durign sleep

## 2010-11-09 NOTE — Assessment & Plan Note (Signed)
Ct CPAP 13 cm Compliant & this is helping Weight loss encouraged, compliance with goal of at least 4-6 hrs every night is the expectation. Advised against medications with sedative side effects Cautioned against driving when sleepy - understanding that sleepiness will vary on a day to day basis

## 2010-11-09 NOTE — Progress Notes (Signed)
  Subjective:    Patient ID: [name], female    DOB: [birth year], 40 y.o.   MRN: [id number]  HPI PCP Amy Elisabeth Most   39/F with morbid obesity for FU of obstructive sleep apnea , chronic respiratory failure & r pleural effusion.  She also has DM-2 & bipolar [name]  She was admitted for worsening dyspnea & wt gain from 4/2-4/12 & seen by Dr Sherene Sires in consultation.  ABG was 7.40/49/63 on 4 L [place]. Echo shoed EF 50% with mild decrease in systolic function. BNP > 500 & CXR showed a rt pleural effusion. CT angio neg for PE  She was diuresed > 50 lbs, discharged on O2 ([place]) with which she is compliant. BNP decreased to 242 & BUN/ cr was 15/0.8. Judicious use of benzos was advised in v/o hypercarbia.  PSG in '02 showed severe obstructive sleep apnea with AHI 40/h, lowest desatn to 52% correctd by 8 cm H2O . Her wt was 325 lbs then.  CPAP titration study >> required 10-16cm CPAP, higher pressure when supine/ REM sleep.  Download 6/18- 08/13/10 >>Good control of events on CPAP 13 cm - changed  to this pressure.    11/09/2010 4 mnth FU Wt 342 - 10 lbs lower than last visit question about dc O2 - Last visit, O2 satn dropped on 3rd lap around the office. Since then, she has stayed off O2 at rest & sleep. Her anxiety & depression symptoms are worse - cymbalta added Stabilised on lasix 40 bid   Review of Systems Patient denies significant dyspnea,cough, hemoptysis,  chest pain, palpitations, pedal edema, orthopnea, paroxysmal nocturnal dyspnea, lightheadedness, nausea, vomiting, abdominal or  leg pains      Objective:   Physical Exam  Gen. Pleasant, obese, in no distress ENT - no lesions, no post nasal drip Neck: No JVD, no thyromegaly, no carotid bruits Lungs: no use of accessory muscles, no dullness to percussion, decreased without rales or rhonchi  Cardiovascular: Rhythm regular, heart sounds  normal, no murmurs or gallops, no peripheral edema Musculoskeletal: No deformities, no cyanosis  or clubbing , no tremors       Assessment & Plan:

## 2010-11-21 ENCOUNTER — Ambulatory Visit (INDEPENDENT_AMBULATORY_CARE_PROVIDER_SITE_OTHER): Payer: PRIVATE HEALTH INSURANCE | Admitting: Psychology

## 2010-11-21 DIAGNOSIS — F319 Bipolar disorder, unspecified: Secondary | ICD-10-CM

## 2010-11-26 ENCOUNTER — Telehealth: Payer: Self-pay | Admitting: Pulmonary Disease

## 2010-11-26 NOTE — Telephone Encounter (Signed)
Reviewed ONO on cpap/RA >> Ok to stay of O2 during sleep

## 2010-11-28 ENCOUNTER — Ambulatory Visit (INDEPENDENT_AMBULATORY_CARE_PROVIDER_SITE_OTHER): Payer: PRIVATE HEALTH INSURANCE | Admitting: Psychology

## 2010-11-28 DIAGNOSIS — F319 Bipolar disorder, unspecified: Secondary | ICD-10-CM

## 2010-11-29 ENCOUNTER — Encounter: Payer: Self-pay | Admitting: Pulmonary Disease

## 2010-12-01 NOTE — Telephone Encounter (Signed)
Dr. Vassie Loll is that stay on or off O2 during sleep? Just wanted to verify before calling patient. Thanks.

## 2010-12-02 NOTE — Telephone Encounter (Signed)
OFF o2

## 2010-12-04 NOTE — Telephone Encounter (Signed)
Called pt and allowed phone to ring multiple rings no answer unable to leave message.

## 2010-12-05 ENCOUNTER — Ambulatory Visit (INDEPENDENT_AMBULATORY_CARE_PROVIDER_SITE_OTHER): Payer: PRIVATE HEALTH INSURANCE | Admitting: Psychology

## 2010-12-05 ENCOUNTER — Telehealth: Payer: Self-pay | Admitting: Pulmonary Disease

## 2010-12-05 DIAGNOSIS — G4733 Obstructive sleep apnea (adult) (pediatric): Secondary | ICD-10-CM

## 2010-12-05 DIAGNOSIS — F319 Bipolar disorder, unspecified: Secondary | ICD-10-CM

## 2010-12-05 NOTE — Telephone Encounter (Signed)
lmomtcb x1 

## 2010-12-05 NOTE — Telephone Encounter (Signed)
SPOKE TO PT'S HUSBAND AND GAVE RESULTS OF ONO PER PT'S REQUEST, THE HUSBAND WOULD LIKE [place] TO SEND ORDER TO [facility] TO HAVE THE O2 PICKED UP--PLS ADVISE IF OK TO SEND ORDER FOR THIS

## 2010-12-05 NOTE — Telephone Encounter (Signed)
Will check for results

## 2010-12-05 NOTE — Telephone Encounter (Signed)
PT AWARE OF RESULTS 

## 2010-12-05 NOTE — Telephone Encounter (Signed)
Per dr Reginia Naas result note pt to ok to stay off o2 during sleep--lmtcb

## 2010-12-06 NOTE — Telephone Encounter (Signed)
I spoke pt husband [name] and is aware order will be sent to have o2 p/u. He verbalized understanding and needed nothing further. Order has been sent

## 2010-12-06 NOTE — Telephone Encounter (Signed)
ok 

## 2010-12-07 ENCOUNTER — Ambulatory Visit (INDEPENDENT_AMBULATORY_CARE_PROVIDER_SITE_OTHER): Payer: PRIVATE HEALTH INSURANCE | Admitting: Psychology

## 2010-12-07 DIAGNOSIS — F319 Bipolar disorder, unspecified: Secondary | ICD-10-CM

## 2010-12-11 ENCOUNTER — Encounter: Payer: Self-pay | Admitting: Cardiology

## 2010-12-11 ENCOUNTER — Ambulatory Visit (INDEPENDENT_AMBULATORY_CARE_PROVIDER_SITE_OTHER): Payer: PRIVATE HEALTH INSURANCE | Admitting: Cardiology

## 2010-12-11 DIAGNOSIS — I5022 Chronic systolic (congestive) heart failure: Secondary | ICD-10-CM

## 2010-12-11 DIAGNOSIS — E669 Obesity, unspecified: Secondary | ICD-10-CM

## 2010-12-11 NOTE — Assessment & Plan Note (Signed)
Today I will not titrate her meds further.  She will continue on the medications as listed.

## 2010-12-11 NOTE — Progress Notes (Signed)
HPI This patient presents for evaluation of a mildly reduced ejection fraction. At the last appt I increased Cozaar.  She tolerated this without lightheadedness or presyncope.  She has had no new dyspnea.  She does have increased depression.  She has also had joint pain and is being referred to a rheumatologist.  She has lost about 8 pounds. She has decreased appetite perhaps related to depression. She's not having any PND or orthopnea. She's having no new palpitations, presyncope or syncope.   No Known Allergies  Current Outpatient Prescriptions  Medication Sig Dispense Refill  . albuterol (PROAIR HFA) 108 (90 BASE) MCG/ACT inhaler Inhale 2 puffs into the lungs every 4 (four) hours as needed.        . ALPRAZolam (XANAX) 1 MG tablet Take 1 mg by mouth 3 (three) times daily as needed.        . Ascorbic Acid (VITAMIN C) 500 MG tablet Take 500 mg by mouth daily.        . clonazePAM (KLONOPIN) 1 MG tablet Take 1 mg by mouth 3 (three) times daily as needed.        . DULoxetine (CYMBALTA) 60 MG capsule Take 60 mg by mouth daily.        . furosemide (LASIX) 40 MG tablet Take 1 tablet (40 mg total) by mouth 2 (two) times daily.  90 tablet  3  . ibuprofen (ADVIL,MOTRIN) 800 MG tablet Take 800 mg by mouth every 8 (eight) hours as needed.        . lamoTRIgine (LAMICTAL) 200 MG tablet Take 200 mg by mouth 2 (two) times daily.        [name] losartan (COZAAR) 25 MG tablet Take 1 tablet (25 mg total) by mouth 2 (two) times daily.  30 tablet  3  . methylphenidate (RITALIN) 10 MG tablet 20 mg. Take 1 1/2 tabs by mouth in the morning, 1 1/2 tabs by mouth at noon and 1 tabs at 4pm      . Multiple Vitamin (MULTIVITAMIN) tablet Take 1 tablet by mouth daily.        . naphazoline (CLEAR EYES) 0.012 % ophthalmic solution 1 drop 4 (four) times daily.        . norethindrone-ethinyl estradiol-iron (MICROGESTIN FE 1.5/30) 1.5-30 MG-MCG tablet Take 1 tablet by mouth at bedtime.        . potassium chloride SA (KLOR-CON M20) 20 MEQ  tablet Take 1 tablet (20 mEq total) by mouth daily.  90 tablet  3  . PREVIDENT 5000 PLUS 1.1 % CREA As directed      . traMADol (ULTRAM) 50 MG tablet Take 1 tablet by mouth 4 times daily as needed.      [name] ZINC SULFATE PO Take by mouth daily.          Past Medical History  Diagnosis Date  . Morbid obesity   . Obesity hypoventilation syndrome   . Depression   . Asthma   . Bipolar disorder   . Hypoxemia   . Acute systolic congestive heart failure   . Cor pulmonale   . History of pleural effusion   . Diabetes mellitus   . Obstructive lung disease     Past Surgical History  Procedure Date  . Wisdom tooth extraction     ROS:  As stated in the HPI and negative for all other systems.  PHYSICAL EXAM BP 110/71  Pulse 100  Ht 5\' 5"  (1.651 m)  Wt 338 lb (153.316 kg)  BMI  56.25 kg/m2 GENERAL:  Well appearing HEENT:  Pupils equal round and reactive, fundi not visualized, oral mucosa unremarkable NECK:  No jugular venous distention, waveform within normal limits, carotid upstroke brisk and symmetric, no bruits, no thyromegaly LYMPHATICS:  No cervical, inguinal adenopathy LUNGS:  Clear to auscultation bilaterally BACK:  No CVA tenderness CHEST:  Unremarkable HEART:  PMI not displaced or sustained,S1 and S2 within normal limits, no S3, no S4, no clicks, no rubs, no murmurs ABD:  Flat, positive bowel sounds normal in frequency in pitch, no bruits, no rebound, no guarding, no midline pulsatile mass, no hepatomegaly, no splenomegaly, morbidly obese EXT:  2 plus pulses throughout, no edema, no cyanosis no clubbing SKIN:  No rashes no nodules NEURO:  Cranial nerves II through XII grossly intact, motor grossly intact throughout PSYCH:  Cognitively intact, oriented to person place and time  ASSESSMENT AND PLAN

## 2010-12-11 NOTE — Patient Instructions (Signed)
The current medical regimen is effective;  continue present plan and medications.  Follow up in 4 months with Dr [name] 

## 2010-12-11 NOTE — Assessment & Plan Note (Addendum)
Wt Readings from Last 3 Encounters:  12/11/10 338 lb (153.316 kg)  11/09/10 342 lb 9.6 oz (155.402 kg)  09/29/10 347 lb (157.398 kg)   I applaud her weight loss and I encourage more of the same.

## 2010-12-12 ENCOUNTER — Ambulatory Visit (INDEPENDENT_AMBULATORY_CARE_PROVIDER_SITE_OTHER): Payer: PRIVATE HEALTH INSURANCE | Admitting: Psychology

## 2010-12-12 DIAGNOSIS — F319 Bipolar disorder, unspecified: Secondary | ICD-10-CM

## 2010-12-19 ENCOUNTER — Ambulatory Visit (INDEPENDENT_AMBULATORY_CARE_PROVIDER_SITE_OTHER): Payer: PRIVATE HEALTH INSURANCE | Admitting: Psychology

## 2010-12-19 DIAGNOSIS — F319 Bipolar disorder, unspecified: Secondary | ICD-10-CM

## 2010-12-26 ENCOUNTER — Ambulatory Visit (INDEPENDENT_AMBULATORY_CARE_PROVIDER_SITE_OTHER): Payer: PRIVATE HEALTH INSURANCE | Admitting: Psychology

## 2010-12-26 DIAGNOSIS — F319 Bipolar disorder, unspecified: Secondary | ICD-10-CM

## 2010-12-31 ENCOUNTER — Telehealth: Payer: Self-pay | Admitting: Pulmonary Disease

## 2010-12-31 NOTE — Telephone Encounter (Signed)
Download on cpap 13 cm 9/17-10/16/12 >> good compliance, no residuals.

## 2011-01-02 ENCOUNTER — Ambulatory Visit (INDEPENDENT_AMBULATORY_CARE_PROVIDER_SITE_OTHER): Payer: PRIVATE HEALTH INSURANCE | Admitting: Psychology

## 2011-01-02 DIAGNOSIS — F319 Bipolar disorder, unspecified: Secondary | ICD-10-CM

## 2011-01-08 ENCOUNTER — Encounter: Payer: Self-pay | Admitting: Cardiology

## 2011-01-08 ENCOUNTER — Encounter: Payer: Self-pay | Admitting: Pulmonary Disease

## 2011-01-09 ENCOUNTER — Ambulatory Visit (INDEPENDENT_AMBULATORY_CARE_PROVIDER_SITE_OTHER): Payer: PRIVATE HEALTH INSURANCE | Admitting: Psychology

## 2011-01-09 DIAGNOSIS — F319 Bipolar disorder, unspecified: Secondary | ICD-10-CM

## 2011-01-10 ENCOUNTER — Telehealth: Payer: Self-pay | Admitting: Cardiology

## 2011-01-10 NOTE — Telephone Encounter (Signed)
Pt called, requesting Dr Antoine Poche to sign a handicapped parking placard for her.  Application left for Dr Antoine Poche to sign.

## 2011-01-10 NOTE — Telephone Encounter (Signed)
New Msg: pt calling regarding needing a handicap "hanger". Please return pt call to discuss further.

## 2011-01-16 ENCOUNTER — Ambulatory Visit (INDEPENDENT_AMBULATORY_CARE_PROVIDER_SITE_OTHER): Payer: PRIVATE HEALTH INSURANCE | Admitting: Psychology

## 2011-01-16 DIAGNOSIS — F319 Bipolar disorder, unspecified: Secondary | ICD-10-CM

## 2011-01-16 NOTE — Telephone Encounter (Signed)
Form completed - will be given to MR to be scanned and call pt to see if she wants to pick it up or have it mailed to her.

## 2011-02-06 ENCOUNTER — Ambulatory Visit (INDEPENDENT_AMBULATORY_CARE_PROVIDER_SITE_OTHER): Payer: PRIVATE HEALTH INSURANCE | Admitting: Psychology

## 2011-02-06 DIAGNOSIS — F319 Bipolar disorder, unspecified: Secondary | ICD-10-CM

## 2011-02-08 DIAGNOSIS — I10 Essential (primary) hypertension: Secondary | ICD-10-CM | POA: Diagnosis not present

## 2011-02-13 ENCOUNTER — Ambulatory Visit: Payer: PRIVATE HEALTH INSURANCE | Admitting: Psychology

## 2011-02-19 ENCOUNTER — Ambulatory Visit (INDEPENDENT_AMBULATORY_CARE_PROVIDER_SITE_OTHER): Payer: 59 | Admitting: Psychology

## 2011-02-19 DIAGNOSIS — F319 Bipolar disorder, unspecified: Secondary | ICD-10-CM

## 2011-02-20 ENCOUNTER — Ambulatory Visit: Payer: PRIVATE HEALTH INSURANCE | Admitting: Psychology

## 2011-02-22 DIAGNOSIS — M2550 Pain in unspecified joint: Secondary | ICD-10-CM | POA: Diagnosis not present

## 2011-02-22 DIAGNOSIS — M1710 Unilateral primary osteoarthritis, unspecified knee: Secondary | ICD-10-CM | POA: Diagnosis not present

## 2011-02-27 ENCOUNTER — Ambulatory Visit (INDEPENDENT_AMBULATORY_CARE_PROVIDER_SITE_OTHER): Payer: 59 | Admitting: Psychology

## 2011-02-27 DIAGNOSIS — F319 Bipolar disorder, unspecified: Secondary | ICD-10-CM | POA: Diagnosis not present

## 2011-03-06 ENCOUNTER — Ambulatory Visit (INDEPENDENT_AMBULATORY_CARE_PROVIDER_SITE_OTHER): Payer: PRIVATE HEALTH INSURANCE | Admitting: Psychology

## 2011-03-06 DIAGNOSIS — F319 Bipolar disorder, unspecified: Secondary | ICD-10-CM

## 2011-03-13 ENCOUNTER — Ambulatory Visit (INDEPENDENT_AMBULATORY_CARE_PROVIDER_SITE_OTHER): Payer: 59 | Admitting: Psychology

## 2011-03-13 DIAGNOSIS — F319 Bipolar disorder, unspecified: Secondary | ICD-10-CM

## 2011-03-20 ENCOUNTER — Ambulatory Visit (INDEPENDENT_AMBULATORY_CARE_PROVIDER_SITE_OTHER): Payer: PRIVATE HEALTH INSURANCE | Admitting: Psychology

## 2011-03-20 DIAGNOSIS — F319 Bipolar disorder, unspecified: Secondary | ICD-10-CM

## 2011-03-27 ENCOUNTER — Ambulatory Visit (INDEPENDENT_AMBULATORY_CARE_PROVIDER_SITE_OTHER): Payer: PRIVATE HEALTH INSURANCE | Admitting: Psychology

## 2011-03-27 DIAGNOSIS — F319 Bipolar disorder, unspecified: Secondary | ICD-10-CM

## 2011-03-29 DIAGNOSIS — M79609 Pain in unspecified limb: Secondary | ICD-10-CM | POA: Diagnosis not present

## 2011-03-29 DIAGNOSIS — F329 Major depressive disorder, single episode, unspecified: Secondary | ICD-10-CM | POA: Diagnosis not present

## 2011-03-29 DIAGNOSIS — R413 Other amnesia: Secondary | ICD-10-CM | POA: Diagnosis not present

## 2011-04-03 ENCOUNTER — Ambulatory Visit (INDEPENDENT_AMBULATORY_CARE_PROVIDER_SITE_OTHER): Payer: PRIVATE HEALTH INSURANCE | Admitting: Psychology

## 2011-04-03 DIAGNOSIS — F319 Bipolar disorder, unspecified: Secondary | ICD-10-CM

## 2011-04-10 ENCOUNTER — Ambulatory Visit (INDEPENDENT_AMBULATORY_CARE_PROVIDER_SITE_OTHER): Payer: PRIVATE HEALTH INSURANCE | Admitting: Psychology

## 2011-04-10 DIAGNOSIS — F319 Bipolar disorder, unspecified: Secondary | ICD-10-CM

## 2011-04-16 ENCOUNTER — Encounter: Payer: Self-pay | Admitting: Cardiology

## 2011-04-16 ENCOUNTER — Ambulatory Visit (INDEPENDENT_AMBULATORY_CARE_PROVIDER_SITE_OTHER): Payer: 59 | Admitting: Cardiology

## 2011-04-16 VITALS — BP 122/72 | HR 87 | Ht 65.0 in | Wt 355.0 lb

## 2011-04-16 DIAGNOSIS — E662 Morbid (severe) obesity with alveolar hypoventilation: Secondary | ICD-10-CM | POA: Diagnosis not present

## 2011-04-16 DIAGNOSIS — I5022 Chronic systolic (congestive) heart failure: Secondary | ICD-10-CM

## 2011-04-16 DIAGNOSIS — I428 Other cardiomyopathies: Secondary | ICD-10-CM | POA: Diagnosis not present

## 2011-04-16 DIAGNOSIS — E669 Obesity, unspecified: Secondary | ICD-10-CM | POA: Diagnosis not present

## 2011-04-16 NOTE — Progress Notes (Signed)
HPI This patient presents for evaluation of a mildly reduced ejection fraction. Since I last saw her has gained several pounds. He is very disappointed about this and very tearful. She's also having problems with her knee which was injected. She's had physical therapy. She's having carpal tunnel problems. She is to get an MRI as a workup for possible MS. All of this seems to have come crashing on her and she is quite distressed today. She's not having any new shortness of breath, PND or orthopnea. She's not having any new palpitations, presyncope or syncope. There's no chest pressure, neck or arm discomfort.   No Known Allergies  Current Outpatient Prescriptions  Medication Sig Dispense Refill  . albuterol (PROAIR HFA) 108 (90 BASE) MCG/ACT inhaler Inhale 2 puffs into the lungs every 4 (four) hours as needed.        . ALPRAZolam (XANAX) 1 MG tablet Take 1 mg by mouth 3 (three) times daily as needed.        . Ascorbic Acid (VITAMIN C) 500 MG tablet Take 500 mg by mouth daily.        . clonazePAM (KLONOPIN) 1 MG tablet Take 1 mg by mouth 3 (three) times daily as needed.        . DULoxetine (CYMBALTA) 60 MG capsule Take 60 mg by mouth daily.        . furosemide (LASIX) 40 MG tablet Take 1 tablet (40 mg total) by mouth 2 (two) times daily.  90 tablet  3  . lamoTRIgine (LAMICTAL) 200 MG tablet Take 200 mg by mouth 2 (two) times daily.        [name] losartan (COZAAR) 25 MG tablet Take 1 tablet (25 mg total) by mouth 2 (two) times daily.  30 tablet  3  . LYRICA 300 MG capsule Take 1 tablet by mouth Twice daily.      . meloxicam (MOBIC) 15 MG tablet Take 1 tablet by mouth daily.      . methylphenidate (RITALIN) 10 MG tablet 20 mg. Take 1 1/2 tabs by mouth in the morning, 1 1/2 tabs by mouth at noon and 1 tabs at 4pm      . Multiple Vitamin (MULTIVITAMIN) tablet Take 1 tablet by mouth daily.        . naphazoline (CLEAR EYES) 0.012 % ophthalmic solution 1 drop 4 (four) times daily.        .  norethindrone-ethinyl estradiol-iron (MICROGESTIN FE 1.5/30) 1.5-30 MG-MCG tablet Take 1 tablet by mouth at bedtime.        . potassium chloride SA (KLOR-CON M20) 20 MEQ tablet Take 1 tablet (20 mEq total) by mouth daily.  90 tablet  3  . PREVIDENT 5000 PLUS 1.1 % CREA As directed      . traMADol (ULTRAM) 50 MG tablet Take 1 tablet by mouth 4 times daily as needed.      [name] ZINC SULFATE PO Take by mouth daily.          Past Medical History  Diagnosis Date  . Morbid obesity   . Obesity hypoventilation syndrome   . Depression   . Asthma   . Bipolar disorder   . Hypoxemia   . Acute systolic congestive heart failure   . Cor pulmonale   . History of pleural effusion   . Diabetes mellitus   . Obstructive lung disease     Past Surgical History  Procedure Date  . Wisdom tooth extraction     ROS:  As  stated in the HPI and negative for all other systems.  PHYSICAL EXAM BP 122/72  Pulse 87  Ht 5\' 5"  (1.651 m)  Wt 355 lb (161.027 kg)  BMI 59.08 kg/m2 GENERAL:  Well appearing HEENT:  Pupils equal round and reactive, fundi not visualized, oral mucosa unremarkable NECK:  No jugular venous distention, waveform within normal limits, carotid upstroke brisk and symmetric, no bruits, no thyromegaly LUNGS:  Clear to auscultation bilaterally BACK:  No CVA tenderness CHEST:  Unremarkable HEART:  PMI not displaced or sustained,S1 and S2 within normal limits, no S3, no S4, no clicks, no rubs, no murmurs ABD:  Flat, positive bowel sounds normal in frequency in pitch, no bruits, no rebound, no guarding, no midline pulsatile mass, no hepatomegaly, no splenomegaly, morbidly obese EXT:  2 plus pulses throughout, no edema, no cyanosis no clubbing PSYCH:  Cognitively intact, oriented to person place, tearful  EKG:    Sinus rhythm, rate 87, axis within normal limits, minimal QT prolongation, RSR prime V1 and V2, no acute ST-T wave changes.  ASSESSMENT AND PLAN

## 2011-04-16 NOTE — Assessment & Plan Note (Signed)
She has a mild cardiomyopathy and she seems to be euvolemic today. She's tolerating the medications as listed. I will suggest no change to her therapy.

## 2011-04-16 NOTE — Assessment & Plan Note (Signed)
I am inquiring of her pulmonologist whether he thinks she might not benefit from pulmonary rehabilitation as she liked a physical therapy is a formal exercise program.

## 2011-04-16 NOTE — Assessment & Plan Note (Signed)
She is very sad about her weight gain. I tried to be encouraging as she is trying.

## 2011-04-16 NOTE — Patient Instructions (Signed)
The current medical regimen is effective;  continue present plan and medications.  Follow up in 6 months with Dr [name].  You will receive a letter in the mail 2 months before you are due.  Please call us when you receive this letter to schedule your follow up appointment.  

## 2011-04-17 ENCOUNTER — Ambulatory Visit (INDEPENDENT_AMBULATORY_CARE_PROVIDER_SITE_OTHER): Payer: PRIVATE HEALTH INSURANCE | Admitting: Psychology

## 2011-04-17 ENCOUNTER — Telehealth: Payer: Self-pay | Admitting: Pulmonary Disease

## 2011-04-17 DIAGNOSIS — F319 Bipolar disorder, unspecified: Secondary | ICD-10-CM

## 2011-04-17 DIAGNOSIS — J90 Pleural effusion, not elsewhere classified: Secondary | ICD-10-CM

## 2011-04-17 NOTE — Telephone Encounter (Signed)
Cardiology suggesting pulm rehab OK to order if she is agreeable They can perform prior to starting

## 2011-04-19 ENCOUNTER — Other Ambulatory Visit: Payer: Self-pay | Admitting: Diagnostic Neuroimaging

## 2011-04-19 ENCOUNTER — Other Ambulatory Visit: Payer: Self-pay | Admitting: Obstetrics and Gynecology

## 2011-04-19 ENCOUNTER — Ambulatory Visit: Payer: 59 | Admitting: Obstetrics and Gynecology

## 2011-04-19 ENCOUNTER — Other Ambulatory Visit (INDEPENDENT_AMBULATORY_CARE_PROVIDER_SITE_OTHER): Payer: 59

## 2011-04-19 DIAGNOSIS — R413 Other amnesia: Secondary | ICD-10-CM

## 2011-04-19 DIAGNOSIS — Z1231 Encounter for screening mammogram for malignant neoplasm of breast: Secondary | ICD-10-CM

## 2011-04-19 DIAGNOSIS — M79609 Pain in unspecified limb: Secondary | ICD-10-CM

## 2011-04-19 DIAGNOSIS — Z01419 Encounter for gynecological examination (general) (routine) without abnormal findings: Secondary | ICD-10-CM

## 2011-04-19 NOTE — Telephone Encounter (Signed)
lmomtcb x1 

## 2011-04-19 NOTE — Telephone Encounter (Signed)
I spoke with pt and she agree's to have pulm rehab set up for her. I have placed order and nothing further was needed

## 2011-04-24 ENCOUNTER — Ambulatory Visit (INDEPENDENT_AMBULATORY_CARE_PROVIDER_SITE_OTHER): Payer: PRIVATE HEALTH INSURANCE | Admitting: Psychology

## 2011-04-24 DIAGNOSIS — F319 Bipolar disorder, unspecified: Secondary | ICD-10-CM

## 2011-04-25 ENCOUNTER — Ambulatory Visit
Admission: RE | Admit: 2011-04-25 | Discharge: 2011-04-25 | Disposition: A | Discharge status: Home / Self Care | Payer: 59 | Source: Ambulatory Visit | Attending: Diagnostic Neuroimaging | Admitting: Diagnostic Neuroimaging

## 2011-04-25 DIAGNOSIS — M79609 Pain in unspecified limb: Secondary | ICD-10-CM | POA: Diagnosis not present

## 2011-04-25 DIAGNOSIS — R413 Other amnesia: Secondary | ICD-10-CM

## 2011-04-25 MED ORDER — GADOBENATE DIMEGLUMINE 529 MG/ML IV SOLN
20.0000 mL | Freq: Once | INTRAVENOUS | Status: AC | PRN
  Administered 2011-04-25: 20 mL via INTRAVENOUS

## 2011-04-30 ENCOUNTER — Other Ambulatory Visit: Payer: Self-pay | Admitting: Diagnostic Neuroimaging

## 2011-04-30 DIAGNOSIS — R9089 Other abnormal findings on diagnostic imaging of central nervous system: Secondary | ICD-10-CM

## 2011-05-01 ENCOUNTER — Ambulatory Visit (INDEPENDENT_AMBULATORY_CARE_PROVIDER_SITE_OTHER): Payer: PRIVATE HEALTH INSURANCE | Admitting: Psychology

## 2011-05-01 DIAGNOSIS — F319 Bipolar disorder, unspecified: Secondary | ICD-10-CM

## 2011-05-08 ENCOUNTER — Ambulatory Visit (INDEPENDENT_AMBULATORY_CARE_PROVIDER_SITE_OTHER): Payer: PRIVATE HEALTH INSURANCE | Admitting: Psychology

## 2011-05-08 DIAGNOSIS — F319 Bipolar disorder, unspecified: Secondary | ICD-10-CM

## 2011-05-09 ENCOUNTER — Ambulatory Visit
Admission: RE | Admit: 2011-05-09 | Discharge: 2011-05-09 | Disposition: A | Discharge status: Home / Self Care | Payer: 59 | Source: Ambulatory Visit | Attending: Diagnostic Neuroimaging | Admitting: Diagnostic Neuroimaging

## 2011-05-09 DIAGNOSIS — M799 Soft tissue disorder, unspecified: Secondary | ICD-10-CM | POA: Diagnosis not present

## 2011-05-09 DIAGNOSIS — R9089 Other abnormal findings on diagnostic imaging of central nervous system: Secondary | ICD-10-CM

## 2011-05-09 DIAGNOSIS — R930 Abnormal findings on diagnostic imaging of skull and head, not elsewhere classified: Secondary | ICD-10-CM | POA: Diagnosis not present

## 2011-05-11 ENCOUNTER — Encounter: Payer: Self-pay | Admitting: Adult Health

## 2011-05-11 ENCOUNTER — Ambulatory Visit (INDEPENDENT_AMBULATORY_CARE_PROVIDER_SITE_OTHER): Payer: 59 | Admitting: Adult Health

## 2011-05-11 VITALS — HR 102 | Temp 97.6°F | Ht 65.0 in | Wt 350.0 lb

## 2011-05-11 DIAGNOSIS — J309 Allergic rhinitis, unspecified: Secondary | ICD-10-CM

## 2011-05-11 DIAGNOSIS — G4733 Obstructive sleep apnea (adult) (pediatric): Secondary | ICD-10-CM

## 2011-05-11 NOTE — Patient Instructions (Signed)
Begin Allegra 180mg  daily As needed  Drainage .  Use Nasonex 2 puffs /nare daily until sample is gone.  Continue on CPAP At bedtime   follow up Dr. Vassie Loll  In 6 months and As needed   Weight loss is key .

## 2011-05-11 NOTE — Progress Notes (Signed)
  Subjective:    Patient ID: [name], female    DOB: 03/24/1970, 41 y.o.   MRN: [id number]  HPI  PCP Amy Elisabeth Most   40WF with morbid obesity for FU of obstructive sleep apnea , chronic respiratory failure & r pleural effusion.  She also has DM-2 & bipolar [name]  She was admitted for worsening dyspnea & wt gain from 4/2-4/12 & seen by Dr Sherene Sires in consultation.  ABG was 7.40/49/63 on 4 L [place]. Echo shoed EF 50% with mild decrease in systolic function. BNP > 500 & CXR showed a rt pleural effusion. CT angio neg for PE  She was diuresed > 50 lbs, discharged on O2 ([place]) with which she is compliant. BNP decreased to 242 & BUN/ cr was 15/0.8. Judicious use of benzos was advised in v/o hypercarbia.  PSG in '02 showed severe obstructive sleep apnea with AHI 40/h, lowest desatn to 52% correctd by 8 cm H2O . Her wt was 325 lbs then.  CPAP titration study >> required 10-16cm CPAP, higher pressure when supine/ REM sleep.  Download 6/18- 08/13/10 >>Good control of events on CPAP 13 cm - changed  to this pressure.    11/09/10  4 mnth FU Wt 342 - 10 lbs lower than last visit question about dc O2 - Last visit, O2 satn dropped on 3rd lap around the office. Since then, she has stayed off O2 at rest & sleep. Her anxiety & depression symptoms are worse - cymbalta added Stabilised on lasix 40 bid  05/11/2011 Follow up  6 month follow up - reports increased SOB, difficulty breathing with lots of drainage and stuffy nose.  CPAP not working as well with nasal issue. No otc used  Discussed weight loss. No fever or increased edema.   Review of Systems Constitutional:   No  weight loss, night sweats,  Fevers, chills, + fatigue, or  lassitude.  HEENT:   No headaches,  Difficulty swallowing,  Tooth/dental problems, or  Sore throat,                No sneezing, itching, ear ache,  +nasal congestion, post nasal drip,   CV:  No chest pain,  Orthopnea, PND, swelling in lower extremities, anasarca,  dizziness, palpitations, syncope.   GI  No heartburn, indigestion, abdominal pain, nausea, vomiting, diarrhea, change in bowel habits, loss of appetite, bloody stools.   Resp:  No excess mucus, no productive cough,  No non-productive cough,  No coughing up of blood.  No change in color of mucus.  No wheezing.  No chest wall deformity  Skin: no rash or lesions.  GU: no dysuria, change in color of urine, no urgency or frequency.  No flank pain, no hematuria   MS:  No joint pain or swelling.  No decreased range of motion.  No back pain.  Psych:  No change in mood or affect. No depression or anxiety.  No memory loss.         Objective:   Physical Exam   Gen. Pleasant, obese, in no distress ENT - no lesions, clear nasal drainage  Neck: No JVD, no thyromegaly, no carotid bruits Lungs: no use of accessory muscles, no dullness to percussion, decreased without rales or rhonchi  Cardiovascular: Rhythm regular, heart sounds  normal, no murmurs or gallops, no peripheral edema Musculoskeletal: No deformities, no cyanosis or clubbing , no tremors       Assessment & Plan:

## 2011-05-14 ENCOUNTER — Other Ambulatory Visit: Payer: Self-pay | Admitting: Obstetrics and Gynecology

## 2011-05-14 NOTE — Telephone Encounter (Signed)
Pt states she will check her pharmacy again after telling her it was done on 05-02-11.  ld

## 2011-05-15 ENCOUNTER — Ambulatory Visit (INDEPENDENT_AMBULATORY_CARE_PROVIDER_SITE_OTHER): Payer: PRIVATE HEALTH INSURANCE | Admitting: Psychology

## 2011-05-15 ENCOUNTER — Telehealth: Payer: Self-pay | Admitting: Pulmonary Disease

## 2011-05-15 ENCOUNTER — Other Ambulatory Visit: Payer: Self-pay | Admitting: Obstetrics and Gynecology

## 2011-05-15 DIAGNOSIS — F319 Bipolar disorder, unspecified: Secondary | ICD-10-CM

## 2011-05-15 MED ORDER — ALBUTEROL SULFATE HFA 108 (90 BASE) MCG/ACT IN AERS: 2.0000 | INHALATION_SPRAY | RESPIRATORY_TRACT | Status: DC | PRN

## 2011-05-15 NOTE — Telephone Encounter (Signed)
Fax request returned to [phone]  ld

## 2011-05-15 NOTE — Telephone Encounter (Signed)
Routed to [name]

## 2011-05-15 NOTE — Telephone Encounter (Signed)
LM for pt that rx for Albuterol was sent to [facility] for 3 mth supply.

## 2011-05-16 DIAGNOSIS — J309 Allergic rhinitis, unspecified: Secondary | ICD-10-CM | POA: Insufficient documentation

## 2011-05-16 NOTE — Assessment & Plan Note (Signed)
Continue on CPAP At bedtime   follow up Dr. Vassie Loll  In 6 months and As needed   Weight loss is key .

## 2011-05-16 NOTE — Assessment & Plan Note (Signed)
Begin Allegra 180mg  daily As needed  Drainage .  Use Nasonex 2 puffs /nare daily until sample is gone.

## 2011-05-22 ENCOUNTER — Ambulatory Visit (INDEPENDENT_AMBULATORY_CARE_PROVIDER_SITE_OTHER): Payer: PRIVATE HEALTH INSURANCE | Admitting: Psychology

## 2011-05-22 DIAGNOSIS — F319 Bipolar disorder, unspecified: Secondary | ICD-10-CM

## 2011-05-24 ENCOUNTER — Ambulatory Visit: Payer: 59

## 2011-05-25 ENCOUNTER — Ambulatory Visit
Admission: RE | Admit: 2011-05-25 | Discharge: 2011-05-25 | Disposition: A | Discharge status: Home / Self Care | Payer: 59 | Source: Ambulatory Visit | Attending: Obstetrics and Gynecology | Admitting: Obstetrics and Gynecology

## 2011-05-25 ENCOUNTER — Ambulatory Visit: Payer: 59

## 2011-05-25 DIAGNOSIS — Z1231 Encounter for screening mammogram for malignant neoplasm of breast: Secondary | ICD-10-CM

## 2011-05-28 DIAGNOSIS — M542 Cervicalgia: Secondary | ICD-10-CM | POA: Diagnosis not present

## 2011-05-29 ENCOUNTER — Ambulatory Visit (INDEPENDENT_AMBULATORY_CARE_PROVIDER_SITE_OTHER): Payer: PRIVATE HEALTH INSURANCE | Admitting: Psychology

## 2011-05-29 DIAGNOSIS — F319 Bipolar disorder, unspecified: Secondary | ICD-10-CM | POA: Diagnosis not present

## 2011-05-30 DIAGNOSIS — G5602 Carpal tunnel syndrome, left upper limb: Secondary | ICD-10-CM

## 2011-05-30 HISTORY — DX: Carpal tunnel syndrome, left upper limb: G56.02

## 2011-06-12 ENCOUNTER — Ambulatory Visit (INDEPENDENT_AMBULATORY_CARE_PROVIDER_SITE_OTHER): Payer: PRIVATE HEALTH INSURANCE | Admitting: Psychology

## 2011-06-12 DIAGNOSIS — F319 Bipolar disorder, unspecified: Secondary | ICD-10-CM

## 2011-06-14 ENCOUNTER — Telehealth: Payer: Self-pay | Admitting: Pulmonary Disease

## 2011-06-14 ENCOUNTER — Other Ambulatory Visit: Payer: Self-pay | Admitting: Neurosurgery

## 2011-06-14 DIAGNOSIS — M542 Cervicalgia: Secondary | ICD-10-CM | POA: Diagnosis not present

## 2011-06-14 DIAGNOSIS — G5600 Carpal tunnel syndrome, unspecified upper limb: Secondary | ICD-10-CM | POA: Diagnosis not present

## 2011-06-14 DIAGNOSIS — M4802 Spinal stenosis, cervical region: Secondary | ICD-10-CM | POA: Diagnosis not present

## 2011-06-14 MED ORDER — MOMETASONE FUROATE 50 MCG/ACT NA SUSP: 2.0000 | Freq: Every day | NASAL | Status: DC

## 2011-06-14 NOTE — Telephone Encounter (Signed)
Ok send Rx - during allergy season

## 2011-06-14 NOTE — Telephone Encounter (Signed)
Pt saw TP last-everything was okay but had slight SOB and runny nose-had been on allergy medications through [facility] (had skin test prior) and had been able to manage. However this year her breathing has gotten worse. Was given Nasonex sample and was told to use Allegra OTC. She has ran out of Nasonex since last visit. Pt would like to have Nasonex Rx and advise how long patient should use it---all the time or during her allergy season. Thanks.

## 2011-06-14 NOTE — Telephone Encounter (Signed)
Called and spoke with pt and she is aware of RA recs to use the nasonex and allegra during the allergy season.  Pt voiced her understanding of this and nothing further needed.

## 2011-06-19 ENCOUNTER — Ambulatory Visit (INDEPENDENT_AMBULATORY_CARE_PROVIDER_SITE_OTHER): Payer: PRIVATE HEALTH INSURANCE | Admitting: Psychology

## 2011-06-19 DIAGNOSIS — F319 Bipolar disorder, unspecified: Secondary | ICD-10-CM

## 2011-06-21 ENCOUNTER — Encounter (HOSPITAL_BASED_OUTPATIENT_CLINIC_OR_DEPARTMENT_OTHER): Payer: Self-pay | Admitting: *Deleted

## 2011-06-21 NOTE — Pre-Procedure Instructions (Signed)
To come for BMET and CXR. 

## 2011-06-22 ENCOUNTER — Encounter (HOSPITAL_BASED_OUTPATIENT_CLINIC_OR_DEPARTMENT_OTHER)
Admission: RE | Admit: 2011-06-22 | Discharge: 2011-06-22 | Disposition: A | Discharge status: Home / Self Care | Payer: 59 | Source: Ambulatory Visit | Attending: Orthopedic Surgery | Admitting: Orthopedic Surgery

## 2011-06-22 ENCOUNTER — Ambulatory Visit
Admission: RE | Admit: 2011-06-22 | Discharge: 2011-06-22 | Disposition: A | Discharge status: Home / Self Care | Payer: 59 | Source: Ambulatory Visit | Attending: Anesthesiology | Admitting: Anesthesiology

## 2011-06-22 DIAGNOSIS — J449 Chronic obstructive pulmonary disease, unspecified: Secondary | ICD-10-CM | POA: Diagnosis not present

## 2011-06-22 DIAGNOSIS — J42 Unspecified chronic bronchitis: Secondary | ICD-10-CM | POA: Diagnosis not present

## 2011-06-22 DIAGNOSIS — Z01812 Encounter for preprocedural laboratory examination: Secondary | ICD-10-CM | POA: Diagnosis not present

## 2011-06-22 DIAGNOSIS — Z01811 Encounter for preprocedural respiratory examination: Secondary | ICD-10-CM | POA: Diagnosis not present

## 2011-06-22 DIAGNOSIS — E662 Morbid (severe) obesity with alveolar hypoventilation: Secondary | ICD-10-CM | POA: Diagnosis not present

## 2011-06-22 DIAGNOSIS — F319 Bipolar disorder, unspecified: Secondary | ICD-10-CM | POA: Diagnosis not present

## 2011-06-22 DIAGNOSIS — G5600 Carpal tunnel syndrome, unspecified upper limb: Secondary | ICD-10-CM | POA: Diagnosis not present

## 2011-06-22 LAB — BASIC METABOLIC PANEL
Calcium: 9.3 mg/dL (ref 8.4–10.5)
GFR calc Af Amer: >90 mL/min (ref >90)
GFR calc non Af Amer: >90 mL/min (ref >90)
Potassium: 4.1 mEq/L (ref 3.5–5.1)
Sodium: 140 mEq/L (ref 135–145)

## 2011-06-26 ENCOUNTER — Ambulatory Visit (INDEPENDENT_AMBULATORY_CARE_PROVIDER_SITE_OTHER): Payer: PRIVATE HEALTH INSURANCE | Admitting: Psychology

## 2011-06-26 ENCOUNTER — Other Ambulatory Visit: Payer: Self-pay | Admitting: Orthopedic Surgery

## 2011-06-26 ENCOUNTER — Ambulatory Visit
Admission: RE | Admit: 2011-06-26 | Discharge: 2011-06-26 | Disposition: A | Discharge status: Home / Self Care | Payer: 59 | Source: Ambulatory Visit | Attending: Neurosurgery | Admitting: Neurosurgery

## 2011-06-26 DIAGNOSIS — M4802 Spinal stenosis, cervical region: Secondary | ICD-10-CM | POA: Diagnosis not present

## 2011-06-26 DIAGNOSIS — M542 Cervicalgia: Secondary | ICD-10-CM

## 2011-06-26 DIAGNOSIS — F319 Bipolar disorder, unspecified: Secondary | ICD-10-CM

## 2011-06-26 DIAGNOSIS — R209 Unspecified disturbances of skin sensation: Secondary | ICD-10-CM | POA: Diagnosis not present

## 2011-06-27 ENCOUNTER — Encounter (HOSPITAL_BASED_OUTPATIENT_CLINIC_OR_DEPARTMENT_OTHER): Payer: Self-pay | Admitting: Orthopedic Surgery

## 2011-06-27 ENCOUNTER — Encounter (HOSPITAL_BASED_OUTPATIENT_CLINIC_OR_DEPARTMENT_OTHER): Payer: Self-pay | Admitting: Anesthesiology

## 2011-06-27 ENCOUNTER — Encounter (HOSPITAL_BASED_OUTPATIENT_CLINIC_OR_DEPARTMENT_OTHER)
Admission: RE | Disposition: A | Discharge status: Home / Self Care | Payer: Self-pay | Source: Ambulatory Visit | Attending: Orthopedic Surgery

## 2011-06-27 ENCOUNTER — Encounter (HOSPITAL_BASED_OUTPATIENT_CLINIC_OR_DEPARTMENT_OTHER): Payer: Self-pay

## 2011-06-27 ENCOUNTER — Ambulatory Visit (HOSPITAL_BASED_OUTPATIENT_CLINIC_OR_DEPARTMENT_OTHER)
Admission: RE | Admit: 2011-06-27 | Discharge: 2011-06-27 | Disposition: A | Discharge status: Home / Self Care | Payer: 59 | Source: Ambulatory Visit | Attending: Orthopedic Surgery | Admitting: Orthopedic Surgery

## 2011-06-27 ENCOUNTER — Ambulatory Visit (HOSPITAL_BASED_OUTPATIENT_CLINIC_OR_DEPARTMENT_OTHER): Payer: 59 | Admitting: Anesthesiology

## 2011-06-27 DIAGNOSIS — G5600 Carpal tunnel syndrome, unspecified upper limb: Secondary | ICD-10-CM | POA: Insufficient documentation

## 2011-06-27 DIAGNOSIS — J449 Chronic obstructive pulmonary disease, unspecified: Secondary | ICD-10-CM | POA: Insufficient documentation

## 2011-06-27 DIAGNOSIS — Z01812 Encounter for preprocedural laboratory examination: Secondary | ICD-10-CM | POA: Insufficient documentation

## 2011-06-27 DIAGNOSIS — E662 Morbid (severe) obesity with alveolar hypoventilation: Secondary | ICD-10-CM | POA: Diagnosis not present

## 2011-06-27 DIAGNOSIS — F319 Bipolar disorder, unspecified: Secondary | ICD-10-CM | POA: Insufficient documentation

## 2011-06-27 DIAGNOSIS — J4489 Other specified chronic obstructive pulmonary disease: Secondary | ICD-10-CM | POA: Insufficient documentation

## 2011-06-27 HISTORY — DX: Other seasonal allergic rhinitis: J30.2

## 2011-06-27 HISTORY — DX: Sleep apnea, unspecified: G47.30

## 2011-06-27 HISTORY — DX: Unspecified convulsions: R56.9

## 2011-06-27 HISTORY — DX: Carpal tunnel syndrome, left upper limb: G56.02

## 2011-06-27 HISTORY — DX: Spinal stenosis, cervical region: M48.02

## 2011-06-27 HISTORY — DX: Unspecified osteoarthritis, unspecified site: M19.90

## 2011-06-27 HISTORY — DX: Anxiety disorder, unspecified: F41.9

## 2011-06-27 HISTORY — PX: CARPAL TUNNEL RELEASE: SHX101

## 2011-06-27 SURGERY — CARPAL TUNNEL RELEASE
Anesthesia: General | Site: Wrist | Laterality: Left | Wound class: Clean

## 2011-06-27 MED ORDER — OXYCODONE-ACETAMINOPHEN 7.5-500 MG PO TABS: 1.0000 | ORAL_TABLET | ORAL | Status: AC | PRN

## 2011-06-27 MED ORDER — MIDAZOLAM HCL 5 MG/5ML IJ SOLN
INTRAMUSCULAR | Status: DC | PRN
  Administered 2011-06-27 (×2): 1 mg via INTRAVENOUS

## 2011-06-27 MED ORDER — OXYCODONE-ACETAMINOPHEN 5-325 MG PO TABS
1.0000 | ORAL_TABLET | ORAL | Status: DC | PRN
  Administered 2011-06-27: 1 via ORAL

## 2011-06-27 MED ORDER — HYDROMORPHONE HCL PF 1 MG/ML IJ SOLN
0.2500 mg | INTRAMUSCULAR | Status: DC | PRN
  Administered 2011-06-27 (×4): 0.5 mg via INTRAVENOUS

## 2011-06-27 MED ORDER — PROPOFOL 10 MG/ML IV EMUL
INTRAVENOUS | Status: DC | PRN
  Administered 2011-06-27: 400 mg via INTRAVENOUS
  Administered 2011-06-27: 50 mg via INTRAVENOUS

## 2011-06-27 MED ORDER — ONDANSETRON HCL 4 MG/2ML IJ SOLN
INTRAMUSCULAR | Status: DC | PRN
  Administered 2011-06-27: 4 mg via INTRAVENOUS

## 2011-06-27 MED ORDER — LORAZEPAM 2 MG/ML IJ SOLN: 1.0000 mg | Freq: Once | INTRAMUSCULAR | Status: DC | PRN

## 2011-06-27 MED ORDER — LIDOCAINE HCL (CARDIAC) 20 MG/ML IV SOLN
INTRAVENOUS | Status: DC | PRN
  Administered 2011-06-27: 100 mg via INTRAVENOUS

## 2011-06-27 MED ORDER — LACTATED RINGERS IV SOLN
INTRAVENOUS | Status: DC
  Administered 2011-06-27: 13:00:00 via INTRAVENOUS

## 2011-06-27 MED ORDER — MIDAZOLAM HCL 2 MG/2ML IJ SOLN: 1.0000 mg | INTRAMUSCULAR | Status: DC | PRN

## 2011-06-27 MED ORDER — CHLORHEXIDINE GLUCONATE 4 % EX LIQD: 60.0000 mL | Freq: Once | CUTANEOUS | Status: DC

## 2011-06-27 MED ORDER — FENTANYL CITRATE 0.05 MG/ML IJ SOLN
INTRAMUSCULAR | Status: DC | PRN
  Administered 2011-06-27 (×2): 25 ug via INTRAVENOUS
  Administered 2011-06-27: 50 ug via INTRAVENOUS

## 2011-06-27 MED ORDER — FENTANYL CITRATE 0.05 MG/ML IJ SOLN: 50.0000 ug | INTRAMUSCULAR | Status: DC | PRN

## 2011-06-27 MED ORDER — BUPIVACAINE HCL (PF) 0.25 % IJ SOLN
INTRAMUSCULAR | Status: DC | PRN
  Administered 2011-06-27: 8 mL

## 2011-06-27 MED ORDER — LACTATED RINGERS IV SOLN
INTRAVENOUS | Status: DC | PRN
  Administered 2011-06-27: 13:00:00 via INTRAVENOUS

## 2011-06-27 SURGICAL SUPPLY — 35 items
BANDAGE GAUZE ELAST BULKY 4 IN (GAUZE/BANDAGES/DRESSINGS) ×2 IMPLANT
BLADE SURG 15 STRL LF DISP TIS (BLADE) ×1 IMPLANT
BLADE SURG 15 STRL SS (BLADE) ×1
BNDG COHESIVE 3X5 TAN STRL LF (GAUZE/BANDAGES/DRESSINGS) ×2 IMPLANT
BNDG ESMARK 4X9 LF (GAUZE/BANDAGES/DRESSINGS) ×2 IMPLANT
CHLORAPREP W/TINT 26ML (MISCELLANEOUS) ×2 IMPLANT
CLOTH BEACON ORANGE TIMEOUT ST (SAFETY) ×2 IMPLANT
CORDS BIPOLAR (ELECTRODE) ×2 IMPLANT
COVER MAYO STAND STRL (DRAPES) ×2 IMPLANT
COVER TABLE BACK 60X90 (DRAPES) ×2 IMPLANT
CUFF TOURNIQUET SINGLE 18IN (TOURNIQUET CUFF) IMPLANT
CUFF TOURNIQUET SINGLE 24IN (TOURNIQUET CUFF) ×2 IMPLANT
DRAPE EXTREMITY T 121X128X90 (DRAPE) ×2 IMPLANT
DRAPE SURG 17X23 STRL (DRAPES) ×2 IMPLANT
DRSG KUZMA FLUFF (GAUZE/BANDAGES/DRESSINGS) ×2 IMPLANT
GAUZE XEROFORM 1X8 LF (GAUZE/BANDAGES/DRESSINGS) ×2 IMPLANT
GLOVE BIO SURGEON STRL SZ 6.5 (GLOVE) ×2 IMPLANT
GLOVE SURG ORTHO 8.0 STRL STRW (GLOVE) ×2 IMPLANT
GOWN BRE IMP PREV XXLGXLNG (GOWN DISPOSABLE) ×4 IMPLANT
GOWN PREVENTION PLUS XLARGE (GOWN DISPOSABLE) ×2 IMPLANT
NEEDLE 27GAX1X1/2 (NEEDLE) ×2 IMPLANT
NS IRRIG 1000ML POUR BTL (IV SOLUTION) ×2 IMPLANT
PACK BASIN DAY SURGERY FS (CUSTOM PROCEDURE TRAY) ×2 IMPLANT
PAD CAST 3X4 CTTN HI CHSV (CAST SUPPLIES) ×1 IMPLANT
PADDING CAST ABS 4INX4YD NS (CAST SUPPLIES) ×1
PADDING CAST ABS COTTON 4X4 ST (CAST SUPPLIES) ×1 IMPLANT
PADDING CAST COTTON 3X4 STRL (CAST SUPPLIES) ×1
SPONGE GAUZE 4X4 12PLY (GAUZE/BANDAGES/DRESSINGS) ×2 IMPLANT
STOCKINETTE 4X48 STRL (DRAPES) ×2 IMPLANT
SUT VICRYL 4-0 PS2 18IN ABS (SUTURE) IMPLANT
SUT VICRYL RAPIDE 4/0 PS 2 (SUTURE) ×2 IMPLANT
SYR BULB 3OZ (MISCELLANEOUS) ×2 IMPLANT
SYR CONTROL 10ML LL (SYRINGE) ×2 IMPLANT
TOWEL OR 17X24 6PK STRL BLUE (TOWEL DISPOSABLE) ×2 IMPLANT
UNDERPAD 30X30 INCONTINENT (UNDERPADS AND DIAPERS) ×2 IMPLANT

## 2011-06-27 NOTE — Anesthesia Preprocedure Evaluation (Addendum)
Anesthesia Evaluation  Patient identified by MRN, date of birth, ID band Patient awake    Reviewed: Allergy & Precautions, H&P , NPO status , Patient's Chart, lab work & pertinent test results  Airway Mallampati: II TM Distance: >3 FB Neck ROM: Full    Dental   Pulmonary shortness of breath, asthma , sleep apnea , COPD + rhonchi         Cardiovascular  cardiomyopathy   Neuro/Psych Seizures -,  Anxiety Depression Bipolar Disorder    GI/Hepatic   Endo/Other  Morbid obesity  Renal/GU      Musculoskeletal   Abdominal (+) + obese,   Peds  Hematology   Anesthesia Other Findings   Reproductive/Obstetrics                          Anesthesia Physical Anesthesia Plan  ASA: III  Anesthesia Plan: General   Post-op Pain Management:    Induction: Intravenous  Airway Management Planned: LMA  Additional Equipment:   Intra-op Plan:   Post-operative Plan: Extubation in OR  Informed Consent: I have reviewed the patients History and Physical, chart, labs and discussed the procedure including the risks, benefits and alternatives for the proposed anesthesia with the patient or authorized representative who has indicated his/her understanding and acceptance.     Plan Discussed with: CRNA and Surgeon  Anesthesia Plan Comments:         Anesthesia Quick Evaluation

## 2011-06-27 NOTE — Anesthesia Postprocedure Evaluation (Signed)
  Anesthesia Post-op Note  Patient: [name]  Procedure(s) Performed: Procedure(s) (LRB): CARPAL TUNNEL RELEASE (Left)  Patient Location: PACU  Anesthesia Type: General  Level of Consciousness: awake and alert   Airway and Oxygen Therapy: Patient Spontanous Breathing  Post-op Pain: mild  Post-op Assessment: Post-op Vital signs reviewed, Patient's Cardiovascular Status Stable, Respiratory Function Stable, Patent Airway, No signs of Nausea or vomiting, Adequate PO intake and Pain level controlled  Post-op Vital Signs: stable  Complications: No apparent anesthesia complications

## 2011-06-27 NOTE — Anesthesia Procedure Notes (Signed)
Procedure Name: LMA Insertion Date/Time: 06/27/2011 2:28 PM Performed by: Caren Macadam Pre-anesthesia Checklist: Patient identified, Emergency Drugs available, Suction available and Patient being monitored Patient Re-evaluated:Patient Re-evaluated prior to inductionOxygen Delivery Method: Circle System Utilized Preoxygenation: Pre-oxygenation with 100% oxygen Intubation Type: IV induction Ventilation: Mask ventilation without difficulty LMA: LMA with gastric port inserted LMA Size: 4.0 Number of attempts: 1 Airway Equipment and Method: bite block Placement Confirmation: positive ETCO2 Tube secured with: Tape Dental Injury: Teeth and Oropharynx as per pre-operative assessment

## 2011-06-27 NOTE — Op Note (Signed)
NAMELajuan Velasquez             ACCOUNT NO.:  [id number]  MEDICAL RECORD NO.:  [id number]  LOCATION:                                 FACILITY:  PHYSICIAN:  Cindee Salt, M.D.            DATE OF BIRTH:  DATE OF PROCEDURE:  06/27/2011 DATE OF DISCHARGE:                              OPERATIVE REPORT   PREOPERATIVE DIAGNOSIS:  Left carpal tunnel syndrome.  POSTOPERATIVE DIAGNOSIS:  Left carpal tunnel syndrome.  OPERATION:  Decompression of left median nerve.  SURGEON:  Cindee Salt, MD  ANESTHESIA:  General with local infiltration.  ANESTHESIOLOGIST:  Bedelia Person, MD  HISTORY:  The patient is a 41 year old female with a history of carpal tunnel syndrome.  EMG nerve conduction is markedly positive.  She has elected to undergo surgical decompression of the left median nerve. Pre, peri and postoperative course have been discussed along with risks and complications.  She is aware that there is no guarantee with THE surgery, possibility of infection; recurrence of injury to arteries, nerves, tendons; incomplete relief of symptoms; and dystrophy.  In the preoperative area, the patient is seen, the extremity marked by both the patient and surgeon, and antibiotic given.  PROCEDURE:  The patient was brought to the operating room where a general anesthetic was carried out without difficulty.  She was prepped using ChloraPrep, supine position, left arm free.  A 3-minute dry time was allowed.  Time-out taken, confirming the patient and procedure.  The limb was exsanguinated with an Esmarch bandage.  The tourniquet placed high on the arm was inflated to 250 mmHg.  A straight incision was made longitudinally in the palm, carried down through the subcutaneous tissue.  Bleeders were electrocauterized.  Palmar fascia was split. Superficial palmar arch identified.  The flexor tendon to the ring and little finger identified to the ulnar side of the median nerve.  The carpal retinaculum was incised  with sharp dissection.  Right angle and Sewall retractor were placed between skin and forearm fascia.  The fascia was released for approximately 1.5 cm proximal to the wrist crease under direct vision.  Canal was explored.  Area of compression to the nerve was immediately apparent with a very significant hyperemia to the entire nerve.  The tourniquet deflated slightly after irrigation, this was then held.  Bleeders were electrocauterized with bipolar.  The tourniquet removed and that it was venous at this point in time. The wound was then closed after reirrigation with interrupted 4-0 Vicryl Rapide sutures.  Sterile compressive dressing with fingers free was applied.  The tourniquet having deflated, revealed normal circulation to all fingers.  Following application of the dressing, the patient was taken to the recovery room.          ______________________________ Cindee Salt, M.D.     GK/MEDQ  D:  06/27/2011  T:  06/27/2011  Job:  [id number]

## 2011-06-27 NOTE — Transfer of Care (Signed)
Immediate Anesthesia Transfer of Care Note  Patient: [name]  Procedure(s) Performed: Procedure(s) (LRB): CARPAL TUNNEL RELEASE (Left)  Patient Location: PACU  Anesthesia Type: General  Level of Consciousness: awake, alert  and oriented  Airway & Oxygen Therapy: Patient Spontanous Breathing and Patient connected to face mask oxygen  Post-op Assessment: Report given to PACU RN and Post -op Vital signs reviewed and stable  Post vital signs: Reviewed and stable  Complications: No apparent anesthesia complications

## 2011-06-27 NOTE — H&P (Signed)
[name] is a 42 year old right hand dominant female referred by Dr. Marisue Brooklyn for consultation with respect to pain, numbness and tingling of her hands.  Activity increases the symptoms. She has no history of diabetes, thyroid problems, arthritis or gout.   She has had the nerve conductions done revealing motor delay of 8 on her left side, 6.3 on her right with sensory delay significantly increased along with decreased amplitudes.    Past Medical History: She has no allergies. She is on Zoloft, Lamictal, Xanax, Klonopin, Lasix, potassium, Tramadol, Loestrin and Ritalin. She has had wisdom teeth removal.  Family Medical History: Positive for arthritis, otherwise negative.  Social History: She does not smoke. She drinks socially. She is married.  Review of Systems: Positive for glasses, contacts, asthma, pneumonia, shortness of breath, depression, nervousness and sleep disorder, otherwise negative for 14 points. [name] is an 41 y.o. female.   Chief Complaint: CTS bilateral HPI:  See above   Past Medical History  Diagnosis Date  . Morbid obesity   . Obesity hypoventilation syndrome   . Depression   . Asthma   . Bipolar disorder   . Hypoxemia     history of - no home O2  . Cor pulmonale   . History of pleural effusion   . Obstructive lung disease   . Seizures     febrile seizure x 1 as a child  . Spinal stenosis in cervical region   . Acute systolic congestive heart failure   . Seasonal allergies     current runny nose  . Arthritis     osteoarthritis bilal. knees  . Urinary incontinence   . Abrasion of breast 06/21/2011    left  . Sleep apnea sleep study 07/02/2010    uses CPAP nightly  . Anxiety     panic attacks  . Shortness of breath     with ADLs  . Carpal tunnel syndrome of left wrist 05/2011    Past Surgical History  Procedure Date  . Wisdom tooth extraction   . Mass excision 08/07/2010    right index    Family History  Problem Relation Age  of Onset  . Asthma Mother   . Asthma Maternal Grandmother   . Allergic rhinitis Mother    Social History:  reports that she has never smoked. She has never used smokeless tobacco. She reports that she drinks alcohol. She reports that she does not use illicit drugs.  Allergies: No Known Allergies  No prescriptions prior to admission    No results found for this or any previous visit (from the past 48 hour(s)).  No results found.   Pertinent items are noted in HPI.  Height 5\' 5"  (1.651 m), weight 160.12 kg (353 lb).  General appearance: alert, cooperative and appears stated age Head: Normocephalic, without obvious abnormality Neck: no adenopathy Resp: clear to auscultation bilaterally Cardio: regular rate and rhythm, S1, S2 normal, no murmur, click, rub or gallop GI: soft, non-tender; bowel sounds normal; no masses,  no organomegaly Extremities: extremities normal, atraumatic, no cyanosis or edema Pulses: 2+ and symmetric Skin: Skin color, texture, turgor normal. No rashes or lesions Neurologic: Grossly normal Incision/Wound: na  Assessment/Plan DX: CTS bilateral We have discussed with her the possibility of surgical intervention with respect to these.  The pre, peri and postoperative course were discussed along with the risks and complications.  The patient is aware there is no guarantee with the surgery, possibility of infection, recurrence, incomplete relief of  symptoms and dystrophy.  She would like to think this over. She will call back to schedule most likely her left side.  This can be done as an outpatient.   Cynde Menard R 06/27/2011, 5:00 AM

## 2011-06-27 NOTE — Brief Op Note (Signed)
06/27/2011  3:07 PM  PATIENT:  [name]  41 y.o. female  PRE-OPERATIVE DIAGNOSIS:  left cts  POST-OPERATIVE DIAGNOSIS:  left Carpal Tunnel Syndrome  PROCEDURE:  Procedure(s) (LRB): CARPAL TUNNEL RELEASE (Left)  SURGEON:  Surgeon(s) and Role:    * Nicki Reaper, MD - Primary  PHYSICIAN ASSISTANT:   ASSISTANTS: none   ANESTHESIA:   local and general  EBL:  Total I/O In: 1000 [I.V.:1000] Out: -   BLOOD ADMINISTERED:none  DRAINS: none   LOCAL MEDICATIONS USED:  MARCAINE     SPECIMEN:  No Specimen  DISPOSITION OF SPECIMEN:  N/A  COUNTS:  YES  TOURNIQUET:   Total Tourniquet Time Documented: Upper Arm (Left) - 7 minutes  DICTATION: .Other Dictation: Dictation Number [id number]  PLAN OF CARE: Discharge to home after PACU  PATIENT DISPOSITION:  PACU - hemodynamically stable.

## 2011-06-27 NOTE — Op Note (Signed)
Dictated number: [id number]

## 2011-06-27 NOTE — Discharge Instructions (Addendum)

## 2011-06-29 ENCOUNTER — Ambulatory Visit (INDEPENDENT_AMBULATORY_CARE_PROVIDER_SITE_OTHER): Payer: Medicare Other | Admitting: Psychology

## 2011-06-29 ENCOUNTER — Encounter (HOSPITAL_BASED_OUTPATIENT_CLINIC_OR_DEPARTMENT_OTHER): Payer: Self-pay | Admitting: Orthopedic Surgery

## 2011-06-29 DIAGNOSIS — F319 Bipolar disorder, unspecified: Secondary | ICD-10-CM

## 2011-07-02 DIAGNOSIS — M542 Cervicalgia: Secondary | ICD-10-CM | POA: Diagnosis not present

## 2011-07-03 ENCOUNTER — Ambulatory Visit (INDEPENDENT_AMBULATORY_CARE_PROVIDER_SITE_OTHER): Payer: Medicare Other | Admitting: Psychology

## 2011-07-03 DIAGNOSIS — F319 Bipolar disorder, unspecified: Secondary | ICD-10-CM

## 2011-07-05 ENCOUNTER — Ambulatory Visit (INDEPENDENT_AMBULATORY_CARE_PROVIDER_SITE_OTHER): Payer: Medicare Other | Admitting: Psychology

## 2011-07-05 DIAGNOSIS — F319 Bipolar disorder, unspecified: Secondary | ICD-10-CM

## 2011-07-06 ENCOUNTER — Ambulatory Visit
Admission: RE | Admit: 2011-07-06 | Discharge: 2011-07-06 | Disposition: A | Discharge status: Home / Self Care | Payer: 59 | Source: Ambulatory Visit | Attending: Neurosurgery | Admitting: Neurosurgery

## 2011-07-06 ENCOUNTER — Other Ambulatory Visit: Payer: Self-pay | Admitting: Neurosurgery

## 2011-07-06 DIAGNOSIS — M5380 Other specified dorsopathies, site unspecified: Secondary | ICD-10-CM | POA: Diagnosis not present

## 2011-07-06 DIAGNOSIS — M542 Cervicalgia: Secondary | ICD-10-CM

## 2011-07-09 ENCOUNTER — Other Ambulatory Visit: Payer: Self-pay

## 2011-07-10 ENCOUNTER — Ambulatory Visit (INDEPENDENT_AMBULATORY_CARE_PROVIDER_SITE_OTHER): Payer: Medicare Other | Admitting: Psychology

## 2011-07-10 DIAGNOSIS — F319 Bipolar disorder, unspecified: Secondary | ICD-10-CM

## 2011-07-12 ENCOUNTER — Ambulatory Visit (INDEPENDENT_AMBULATORY_CARE_PROVIDER_SITE_OTHER): Payer: Medicare Other | Admitting: Psychology

## 2011-07-12 DIAGNOSIS — F319 Bipolar disorder, unspecified: Secondary | ICD-10-CM

## 2011-07-16 DIAGNOSIS — M5412 Radiculopathy, cervical region: Secondary | ICD-10-CM | POA: Diagnosis not present

## 2011-07-16 DIAGNOSIS — M4712 Other spondylosis with myelopathy, cervical region: Secondary | ICD-10-CM | POA: Diagnosis not present

## 2011-07-16 DIAGNOSIS — M4802 Spinal stenosis, cervical region: Secondary | ICD-10-CM | POA: Diagnosis not present

## 2011-07-17 ENCOUNTER — Ambulatory Visit (INDEPENDENT_AMBULATORY_CARE_PROVIDER_SITE_OTHER): Payer: Medicare Other | Admitting: Psychology

## 2011-07-17 ENCOUNTER — Other Ambulatory Visit: Payer: Self-pay | Admitting: *Deleted

## 2011-07-17 DIAGNOSIS — F319 Bipolar disorder, unspecified: Secondary | ICD-10-CM

## 2011-07-17 DIAGNOSIS — I509 Heart failure, unspecified: Secondary | ICD-10-CM

## 2011-07-17 MED ORDER — LOSARTAN POTASSIUM 25 MG PO TABS: 25.0000 mg | ORAL_TABLET | Freq: Two times a day (BID) | ORAL | Status: DC

## 2011-07-18 ENCOUNTER — Telehealth: Payer: Self-pay | Admitting: Pulmonary Disease

## 2011-07-18 NOTE — Telephone Encounter (Signed)
Called spoke with patient, advised her of KC's recs.  Informed pt that it was highly recommended that she be seen tomorrow and offered both the 9:15am and 2pm appts with TP.  Pt declined both of these stating that she prefers afternoon appts and she has a dental appt tomorrow afternoon.  Pt then decided that she wanted to cancel the Friday 6.21.13 appt with TP that was scheduled per our last conversation and rsc this to [facility] 6.24.13 @ 2pm.  Advised pt again that it was rec'd she be seen as soon as tomorrow, but again pt declined.  Advised pt that if her breathing worsens at before she is seen in the office to please call 911 or go to the ER for emergency assistance.  Pt verbalized her understanding.  Will sign off and forward to RA as FYI.

## 2011-07-18 NOTE — Telephone Encounter (Signed)
Called spoke with patient who reports increased asthma attacks; more pronounced since beginning the nasonex and allegra - pt is still taking these.  Last attack was Monday morning.  Woke up gasping at 5:30am, feels like her airway tries to close to where she is "barely able to inhale."  Proair does help.  Vomited after she "opened back up" last time with "a lot of clear fluid."  Pt wonders if an epi pen would help.  Offered ov with TP tomorrow morning (6.20.13 at 0900) but pt declined.  Ov scheduled with TP 6.21.13 @ 12N but pt prefers to not come in to the office as she is due for spinal surgery and it is difficult to ambulate.   RA is scheduled off.  Will forward to doc of the day for recs.  Dr Shelle Iron please advise, thanks.

## 2011-07-18 NOTE — Telephone Encounter (Signed)
I wonder if this is more of an upper airway problem related to nasonex and allegra if she thinks this worsened with taking the medications.  Her symptoms sound more upper airway.  I think she needs to have a face to face evaluation, and would highly recommend she keep visit with TP for tomorrow.   If she worsens overnight, will need to go to ER>

## 2011-07-18 NOTE — Telephone Encounter (Signed)
If having further problems pt will need ov to discuss.

## 2011-07-20 ENCOUNTER — Ambulatory Visit: Payer: Self-pay | Admitting: Adult Health

## 2011-07-23 ENCOUNTER — Encounter: Payer: Self-pay | Admitting: Adult Health

## 2011-07-23 ENCOUNTER — Ambulatory Visit (INDEPENDENT_AMBULATORY_CARE_PROVIDER_SITE_OTHER): Payer: 59 | Admitting: Adult Health

## 2011-07-23 ENCOUNTER — Other Ambulatory Visit: Payer: 59

## 2011-07-23 VITALS — BP 148/80 | HR 110 | Temp 97.8°F | Wt 364.4 lb

## 2011-07-23 DIAGNOSIS — J45909 Unspecified asthma, uncomplicated: Secondary | ICD-10-CM

## 2011-07-23 DIAGNOSIS — J309 Allergic rhinitis, unspecified: Secondary | ICD-10-CM

## 2011-07-23 NOTE — Patient Instructions (Addendum)
Stop Allegra -change to  Zyrtec 10mg  daily  Add Chlortrimeton 4mg  2 At bedtime   Return for PFT in 1 month .  follow up Dr. Vassie Loll  In 1 month and As needed   Saline nasal rinses As needed   I will call with lab results.  Please contact office for sooner follow up if symptoms do not improve or worsen or seek emergency care

## 2011-07-23 NOTE — Progress Notes (Signed)
Subjective:    Patient ID: [name], female    DOB: Jun 26, 1970, 41 y.o.   MRN: [id number]  HPI  PCP Amy Elisabeth Most   40WF with morbid obesity for FU of obstructive sleep apnea , chronic respiratory failure & r pleural effusion.  She also has DM-2 & bipolar [name]  She was admitted for worsening dyspnea & wt gain from 4/2-4/12 & seen by Dr Sherene Sires in consultation.  ABG was 7.40/49/63 on 4 L [place]. Echo shoed EF 50% with mild decrease in systolic function. BNP > 500 & CXR showed a rt pleural effusion. CT angio neg for PE  She was diuresed > 50 lbs, discharged on O2 ([place]) with which she is compliant. BNP decreased to 242 & BUN/ cr was 15/0.8. Judicious use of benzos was advised in v/o hypercarbia.  PSG in '02 showed severe obstructive sleep apnea with AHI 40/h, lowest desatn to 52% correctd by 8 cm H2O . Her wt was 325 lbs then.  CPAP titration study >> required 10-16cm CPAP, higher pressure when supine/ REM sleep.  Download 6/18- 08/13/10 >>Good control of events on CPAP 13 cm - changed  to this pressure.    11/09/10  4 mnth FU Wt 342 - 10 lbs lower than last visit question about dc O2 - Last visit, O2 satn dropped on 3rd lap around the office. Since then, she has stayed off O2 at rest & sleep. Her anxiety & depression symptoms are worse - cymbalta added Stabilised on lasix 40 bid  05/11/2011 Follow up  6 month follow up - reports increased SOB, difficulty breathing with lots of drainage and stuffy nose.  CPAP not working as well with nasal issue. No otc used  Discussed weight loss. No fever or increased edema.  >allegra and nasonex   07/23/2011 Acute OV  Complains of increased asthma attacks since beginning nasonex and allegra with gasping and feeling like her airway is trying to close  More wheezing since starting on allegra and nasonex. Does not feel allegra is working.  Nasonex helps some.  Wheezing/drainage worse first thing in the morning .  Lots of nasal drainage/Post nasal  drip in morning.  Previously seen by allergist, recommended to have allergy vaccines . Declined.  CXR last month showed bronchitic changes  Never smoker.  +drug screen in chart review +marajuana 2012.  Says previous dx with asthma has been tx in past with advair , theophylline.  Currently on albuterol inhaler As needed  -uses most days 1-2 times daily.  Has upcoming neck surgery for spinal stenosis.   Review of Systems Constitutional:   No  weight loss, night sweats,  Fevers, chills, + fatigue, or  lassitude.  HEENT:   No headaches,  Difficulty swallowing,  Tooth/dental problems, or  Sore throat,                No sneezing, itching, ear ache,  +nasal congestion, post nasal drip,   CV:  No chest pain,  Orthopnea, PND, swelling in lower extremities, anasarca, dizziness, palpitations, syncope.   GI  No heartburn, indigestion, abdominal pain, nausea, vomiting, diarrhea, change in bowel habits, loss of appetite, bloody stools.   Resp:      No chest wall deformity  Skin: no rash or lesions.  GU: no dysuria, change in color of urine, no urgency or frequency.  No flank pain, no hematuria   MS:  No joint pain or swelling.  No decreased range of motion.  No back pain.  Psych:  No change in mood or affect. No depression or anxiety.  No memory loss.         Objective:   Physical Exam   Gen. Pleasant, obese, in no distress ENT - no lesions, clear nasal drainage  Neck: No JVD, no thyromegaly, no carotid bruits Lungs: no use of accessory muscles, no dullness to percussion, decreased without rales or rhonchi  Cardiovascular: Rhythm regular, heart sounds  normal, no murmurs or gallops, no peripheral edema Musculoskeletal: No deformities, no cyanosis or clubbing , no tremors       Assessment & Plan:

## 2011-07-24 ENCOUNTER — Ambulatory Visit (INDEPENDENT_AMBULATORY_CARE_PROVIDER_SITE_OTHER): Payer: PRIVATE HEALTH INSURANCE | Admitting: Psychology

## 2011-07-24 DIAGNOSIS — F319 Bipolar disorder, unspecified: Secondary | ICD-10-CM

## 2011-07-24 NOTE — Assessment & Plan Note (Signed)
Flare  Check rast/ige  Plan;  Stop Allegra -change to  Zyrtec 10mg  daily  Add Chlortrimeton 4mg  2 At bedtime   Return for PFT in 1 month .  follow up Dr. Vassie Loll  In 1 month and As needed   Saline nasal rinses As needed   I will call with lab results.  Please contact office for sooner follow up if symptoms do not improve or worsen or seek emergency care

## 2011-07-25 LAB — ALLERGY FULL PROFILE
Alternaria Alternata: <0.1 kU/L (ref <0.35)
Bermuda Grass: <0.1 kU/L (ref <0.35)
Common Ragweed: <0.1 kU/L (ref <0.35)
Fescue: <0.1 kU/L (ref <0.35)
G005 Rye, Perennial: <0.1 kU/L (ref <0.35)
G009 Red Top: <0.1 kU/L (ref <0.35)
Goldenrod: <0.1 kU/L (ref <0.35)
Helminthosporium halodes: <0.1 kU/L (ref <0.35)
IgE (Immunoglobulin E), Serum: 43.6 IU/mL (ref 0.0–180.0)
Plantain: <0.1 kU/L (ref <0.35)
Stemphylium Botryosum: <0.1 kU/L (ref <0.35)
Sycamore Tree: <0.1 kU/L (ref <0.35)
Timothy Grass: <0.1 kU/L (ref <0.35)

## 2011-07-25 NOTE — Progress Notes (Signed)
Quick Note:  Spoke with pt and notified of results per TP. She verbalized understanding. ______

## 2011-07-26 ENCOUNTER — Other Ambulatory Visit: Payer: Self-pay | Admitting: Neurosurgery

## 2011-07-27 ENCOUNTER — Ambulatory Visit (INDEPENDENT_AMBULATORY_CARE_PROVIDER_SITE_OTHER): Payer: 59 | Admitting: Pulmonary Disease

## 2011-07-27 DIAGNOSIS — J45909 Unspecified asthma, uncomplicated: Secondary | ICD-10-CM | POA: Diagnosis not present

## 2011-07-27 LAB — PULMONARY FUNCTION TEST

## 2011-07-27 NOTE — Progress Notes (Signed)
PFT done today. 

## 2011-07-31 ENCOUNTER — Ambulatory Visit (INDEPENDENT_AMBULATORY_CARE_PROVIDER_SITE_OTHER): Payer: Medicare Other | Admitting: Psychology

## 2011-07-31 DIAGNOSIS — F319 Bipolar disorder, unspecified: Secondary | ICD-10-CM

## 2011-08-01 ENCOUNTER — Telehealth: Payer: Self-pay | Admitting: Cardiology

## 2011-08-01 ENCOUNTER — Telehealth: Payer: Self-pay | Admitting: Pulmonary Disease

## 2011-08-01 MED ORDER — FUROSEMIDE 40 MG PO TABS: ORAL_TABLET | ORAL | Status: DC

## 2011-08-01 NOTE — Telephone Encounter (Signed)
Pt has been diagnosed with spinal and brain tumors and they need to do surgery and she got depressed about this and she has gained weight back and she has been taking her fluid pills and they don't seem to be working and she wants to know why they are not working she is very concerned. She is seeing Dr. Venetia Maxon with [facility] she wants to talk to you about this

## 2011-08-01 NOTE — Telephone Encounter (Signed)
Pt aware of recs. Jennifer Castillo, CMA  

## 2011-08-01 NOTE — Telephone Encounter (Signed)
pfts do not show airway obstruction/ asthma She does not need albuterol kind of medications I note that her wt is up again - does she have increased swelling of her legs. Increase lasix to 80 mg qam & 40 mg q pm  - if no better by next week pl arrange Ov

## 2011-08-01 NOTE — Telephone Encounter (Signed)
Spoke with pt. She states that she is needing results of PFT and also wants to let RA know that she continues to have occ wheezing and SOB since last appt with TP on 07/23/11. She states that she has been using zyrtec as directed and has not noticed any change. I advised will forward to doc of day for recs but she prefers msg only go to RA and she is aware that he is out of the office again until next wk. I advised her to call back if she gets worse or seek emergent care and she verbalized understanding.

## 2011-08-01 NOTE — Telephone Encounter (Signed)
Spoke with pt spoke reports she is scheduled to have surgery (cerival laminectomy) August 13.  She reports that she has been quite depressed since being Dx with Many "life threatening diseases" and had been eating a lot of sweats for the past 1 and 1/2 weeks.  She doesn't feel as though the Lasix is working anymore as she has put on about 20 lbs over the past 6 weeks.  She reports her urine output is put is down.  She called Dr Vicente Males office today and he increased her Lasix to 80mg /40mg  daily.  She will continue medications as ordered - she will keep she feet and legs elevated as much as possible and continue to weigh herself daily.  She will call back if no improvement in her edema and weight loss.

## 2011-08-07 ENCOUNTER — Ambulatory Visit (INDEPENDENT_AMBULATORY_CARE_PROVIDER_SITE_OTHER): Payer: Medicare Other | Admitting: Psychology

## 2011-08-07 DIAGNOSIS — F319 Bipolar disorder, unspecified: Secondary | ICD-10-CM | POA: Diagnosis not present

## 2011-08-14 ENCOUNTER — Ambulatory Visit (INDEPENDENT_AMBULATORY_CARE_PROVIDER_SITE_OTHER): Payer: PRIVATE HEALTH INSURANCE | Admitting: Psychology

## 2011-08-14 DIAGNOSIS — F319 Bipolar disorder, unspecified: Secondary | ICD-10-CM

## 2011-08-16 ENCOUNTER — Ambulatory Visit (INDEPENDENT_AMBULATORY_CARE_PROVIDER_SITE_OTHER): Payer: PRIVATE HEALTH INSURANCE | Admitting: Psychology

## 2011-08-16 DIAGNOSIS — F319 Bipolar disorder, unspecified: Secondary | ICD-10-CM

## 2011-08-21 ENCOUNTER — Ambulatory Visit (INDEPENDENT_AMBULATORY_CARE_PROVIDER_SITE_OTHER): Payer: PRIVATE HEALTH INSURANCE | Admitting: Psychology

## 2011-08-21 DIAGNOSIS — F319 Bipolar disorder, unspecified: Secondary | ICD-10-CM

## 2011-08-23 ENCOUNTER — Ambulatory Visit (INDEPENDENT_AMBULATORY_CARE_PROVIDER_SITE_OTHER): Payer: PRIVATE HEALTH INSURANCE | Admitting: Psychology

## 2011-08-23 DIAGNOSIS — F319 Bipolar disorder, unspecified: Secondary | ICD-10-CM

## 2011-08-28 ENCOUNTER — Ambulatory Visit (INDEPENDENT_AMBULATORY_CARE_PROVIDER_SITE_OTHER): Payer: PRIVATE HEALTH INSURANCE | Admitting: Psychology

## 2011-08-28 DIAGNOSIS — F319 Bipolar disorder, unspecified: Secondary | ICD-10-CM

## 2011-08-29 ENCOUNTER — Encounter (HOSPITAL_COMMUNITY): Payer: Self-pay | Admitting: Pharmacy Technician

## 2011-08-30 ENCOUNTER — Ambulatory Visit (INDEPENDENT_AMBULATORY_CARE_PROVIDER_SITE_OTHER): Payer: PRIVATE HEALTH INSURANCE | Admitting: Psychology

## 2011-08-30 DIAGNOSIS — M4712 Other spondylosis with myelopathy, cervical region: Secondary | ICD-10-CM | POA: Diagnosis not present

## 2011-08-30 DIAGNOSIS — F319 Bipolar disorder, unspecified: Secondary | ICD-10-CM

## 2011-09-02 ENCOUNTER — Other Ambulatory Visit: Payer: Self-pay | Admitting: Pulmonary Disease

## 2011-09-04 ENCOUNTER — Telehealth: Payer: Self-pay | Admitting: Pulmonary Disease

## 2011-09-04 ENCOUNTER — Encounter (HOSPITAL_COMMUNITY): Payer: Self-pay

## 2011-09-04 ENCOUNTER — Encounter (HOSPITAL_COMMUNITY)
Admission: RE | Admit: 2011-09-04 | Discharge: 2011-09-04 | Disposition: A | Discharge status: Home / Self Care | Payer: 59 | Source: Ambulatory Visit | Attending: Neurosurgery | Admitting: Neurosurgery

## 2011-09-04 ENCOUNTER — Ambulatory Visit: Payer: Medicare Other | Admitting: Psychology

## 2011-09-04 HISTORY — DX: Irritable bowel syndrome, unspecified: K58.9

## 2011-09-04 LAB — BASIC METABOLIC PANEL
BUN: 10 mg/dL (ref 6–23)
Chloride: 103 mEq/L (ref 96–112)
GFR calc Af Amer: >90 mL/min (ref >90)
Glucose, Bld: 129 mg/dL — ABNORMAL HIGH (ref 70–99)
Potassium: 4.9 mEq/L (ref 3.5–5.1)

## 2011-09-04 LAB — CBC
HCT: 43.9 % (ref 36.0–46.0)
Hemoglobin: 14.1 g/dL (ref 12.0–15.0)
MCHC: 32.1 g/dL (ref 30.0–36.0)
RBC: 4.59 MIL/uL (ref 3.87–5.11)

## 2011-09-04 NOTE — Telephone Encounter (Signed)
I spoke with pt and she is going to keep her appt with RA

## 2011-09-04 NOTE — Telephone Encounter (Signed)
I spoke with pt and she had to cancel her appt tomorrow w/ RA. She states she is having surgery next tues and is wanting to know if she will need surgical clearance. She is having t2-t3 fusion by Dr. Venetia Maxon. Also pt is requesting refill on lasix. We filled it 08/01/11 #90 x 0 refills but prior Dr. Antoine Poche has been refilling this. Please advise Dr. Vassie Loll thanks

## 2011-09-04 NOTE — Pre-Procedure Instructions (Signed)
20 Breeanne Oblinger  09/04/2011   Your procedure is scheduled on:  Tuesday, August 13th.  Report to [facility] at 8:30 AM.  Call this number if you have problems the morning of surgery: [phone]   Remember:   Do not eat food or anything to drink: After MIdnight    Take these medicines the morning of surgery with A SIP OF WATER: Alprazolam (Xanax), Cetirizine (Zyrtec), Clonazepam (Klonopin), Duloxetine (Cymbalta),  Lyrica, Methyiphenidate (Ritalin).  May take Tramadol (Ultram) and use Albuterol inhaler and bring inhaler to the hospital with you.  Do not wear jewelry, make-up or nail polish.  Do not wear lotions, powders, or perfumes. You may wear deodorant.  Do not shave 48 hours prior to surgery. Men may shave face and neck.  Do not bring valuables to the hospital.  Contacts, dentures or bridgework may not be worn into surgery.  Leave suitcase in the car. After surgery it may be brought to your room.  For patients admitted to the hospital, checkout time is 11:00 AM the day of discharge.   Patients discharged the day of surgery will not be allowed to drive home.  Name and phone number of your driver: NA  Special Instructions: CHG Shower Use Special Wash: 1/2 bottle night before surgery and 1/2 bottle morning of surgery.   Please read over the following fact sheets that you were given: Pain Booklet, Coughing and Deep Breathing and Surgical Site Infection Prevention

## 2011-09-04 NOTE — Telephone Encounter (Signed)
Prefer if she does - try to keep appt

## 2011-09-05 ENCOUNTER — Ambulatory Visit: Payer: Self-pay | Admitting: Pulmonary Disease

## 2011-09-05 ENCOUNTER — Encounter: Payer: Self-pay | Admitting: Pulmonary Disease

## 2011-09-05 ENCOUNTER — Ambulatory Visit (INDEPENDENT_AMBULATORY_CARE_PROVIDER_SITE_OTHER): Payer: PRIVATE HEALTH INSURANCE | Admitting: Pulmonary Disease

## 2011-09-05 VITALS — BP 128/62 | HR 95 | Temp 98.2°F | Ht 65.0 in | Wt 364.0 lb

## 2011-09-05 DIAGNOSIS — G4733 Obstructive sleep apnea (adult) (pediatric): Secondary | ICD-10-CM

## 2011-09-05 DIAGNOSIS — I5022 Chronic systolic (congestive) heart failure: Secondary | ICD-10-CM

## 2011-09-05 DIAGNOSIS — J45909 Unspecified asthma, uncomplicated: Secondary | ICD-10-CM | POA: Insufficient documentation

## 2011-09-05 NOTE — Assessment & Plan Note (Signed)
Stay on lasix daily 

## 2011-09-05 NOTE — Progress Notes (Signed)
  Subjective:    Patient ID: [name], female    DOB: [birth year], 41 y.o.   MRN: [id number]  HPI PCP Amy Elisabeth Most   39JW never smoker with morbid obesity for FU of obstructive sleep apnea on CPAP 13 cm, obesity hypoventilation  & 'asthma' She also has DM-2 & bipolar [name]  She was admitted 4/12 for worsening dyspnea & wt gain . ABG was 7.40/49/63 on 4 L [place]. Echo showed EF 50% with mild decrease in systolic function. BNP > 500 & CXR showed a rt pleural effusion. CT angio neg for PE  She was diuresed > 50 lbs, discharged on O2 ([place]) . Judicious use of benzos was advised in v/o hypercarbia.  PSG in '02 showed severe obstructive sleep apnea with AHI 40/h, lowest desatn to 52% correctd by 8 cm H2O . Her wt was 325 lbs then.  CPAP titration study >> required 10-16cm CPAP, higher pressure when supine/ REM sleep.  Download 6/18- 08/13/10 >>Good control of events on CPAP 13 cm - changed to this pressure.        09/05/2011 she is having  cervical fusion by Dr. Venetia Maxon next tues - for surgical clearance.  Wt steady at 364 lbs -25 lbs gain in the last yr. COmpliant with CPAP O2 dc'd 10/12 allegra and nasonex added 4/13 for nasal symptoms but c/o increased asthma attacks after addition incl acute OV in 6/13 - changed regimen to Zyrtec +Chlortrimeton  6/13  Previously seen by allergist, recommended to have allergy vaccines . Declined.  CXR 5/13 showed bronchitic changes  +drug screen in chart review +marajuana 2012.  Says previous dx with asthma has been tx in past with advair , theophylline.  Currently on albuterol inhaler As needed -uses most days 1-2 times daily.   PFTs 6/13 reviewed - no obstruction, mild restriction, FVC 78%, DLCO 64%  Tearful during visit, morning is not a good time, her bipolar symptoms ' are worse in am'    Review of Systems neg for any significant sore throat, dysphagia, itching, sneezing, nasal congestion or excess/ purulent secretions, fever, chills, sweats,  unintended wt loss, pleuritic or exertional cp, hempoptysis, orthopnea pnd or change in chronic leg swelling. Also denies presyncope, palpitations, heartburn, abdominal pain, nausea, vomiting, diarrhea or change in bowel or urinary habits, dysuria,hematuria, rash, arthralgias, visual complaints, headache, numbness weakness or ataxia.     Objective:   Physical Exam  Gen. Pleasant, obese, in no distress, normal affect ENT - no lesions, no post nasal drip, class 2-3 airway Neck: No JVD, no thyromegaly, no carotid bruits Lungs: no use of accessory muscles, no dullness to percussion, decreased without rales or rhonchi  Cardiovascular: Rhythm regular, heart sounds  normal, no murmurs or gallops, no peripheral edema Abdomen: soft and non-tender, no hepatosplenomegaly, BS normal. Musculoskeletal: No deformities, no cyanosis or clubbing Neuro:  alert, non focal, no tremors       Assessment & Plan:

## 2011-09-05 NOTE — Patient Instructions (Signed)
You are at moderate risk for respiratory complications after neck surgery Use albuterol as needed Stay on lasix daily OK to stop chlortrimeton, stay on zyrtec Your cpap is set at 13 cm, take this with you to the hospital

## 2011-09-05 NOTE — Assessment & Plan Note (Signed)
Mild intermittent based on history 6/13 - no reversibility on PFTs Use albuterol as needed OK to stop chlortrimeton, stay on zyrtec

## 2011-09-05 NOTE — Assessment & Plan Note (Addendum)
She is at moderate risk for respiratory complications after neck surgery Anticipate difficult airway Your cpap is set at 13 cm, take this with you to the hospital CPAP should be used immediately post op after extubation & during sleep for hospital stay & after

## 2011-09-07 ENCOUNTER — Ambulatory Visit (INDEPENDENT_AMBULATORY_CARE_PROVIDER_SITE_OTHER): Payer: PRIVATE HEALTH INSURANCE | Admitting: Psychology

## 2011-09-07 DIAGNOSIS — F319 Bipolar disorder, unspecified: Secondary | ICD-10-CM

## 2011-09-10 ENCOUNTER — Other Ambulatory Visit: Payer: Self-pay | Admitting: Cardiology

## 2011-09-10 MED ORDER — DEXTROSE 5 % IV SOLN
3.0000 g | INTRAVENOUS | Status: AC
  Administered 2011-09-11: 3 g via INTRAVENOUS
  Filled 2011-09-10: qty 3000

## 2011-09-10 NOTE — H&P (Signed)
[name]  #[id number] DOB: 07-27-1970 07/16/2011:  I spent 25 minutes meeting with the patient and her companion today.  She has a CT scan which demonstrates severe spinal canal stenosis with an AP diameter of the spinal canal of 4 mm.  She has ossification of the posterior longitudinal ligament and this goes up to C2 and down to the bottom C4.    I have recommended to her that she undergo decompression and fusion at C3-4 with possible undercutting of C2, possible fusion of C2, although I do not think this will be necessary, and with wide decompression at C3-4 with posterior instrumentation.  There is significant risks due to her comorbidities related to her extremely large size and the severity of the spinal cord compression. I told her I do not think that there is a good option from a nonsurgical standpoint and I think that the anterior surgery would be more difficult for her.  She wishes to go ahead and we will plan on doing so in the relatively near future.  I discussed issues of paralysis, damage to the nerves, damage to blood vessels, weakness, wound infection, need for revision surgery, development of OPLL of other levels. She is aware of all these risks and wishes to proceed.          [name]. [name], M.D./[facility]  [name] #[id number] DOB:  19-Dec-1970  May 28, 2011  HISTORY:     [name] is a 41 year old disabled woman with neck pain, tremor in her hands and "clumsiness".  She notes pain all over and says that she falls.  She says this has been worse since 08/2010.  She said she has been to a psychiatrist who referred her for pain complaints.  She has been told that she had bilateral carpal tunnel syndrome and wanted a second opinion regarding that.  She also has ben worked up for MS and stenosis was found.  She currently is on Lyrica 300 mg. b.i.d., Tramadol 50 mg. 2 four times daily, and Mobic 15 mg. q.d.  She was told that she needs surgery for  carpal tunnel syndrome, left greater than right.  She notes her left hand is numb and she is dropping things.  She also notes burning into her right hand.    She has a history of congestive heart failure diagnosed in 04/2010 with a right pleural effusion, bipolar disorder, anxiety, panic attacks and agoraphobia.  She had a fibroma removed from the right index finger in 07/2010.  Years ago, she had her wisdom teeth removed.    She uses Lasix for edema on a p.r.n. basis.  She saw Dr. Marjory Lies who felt that if she needed a myelogram performed he would recommend CSF studies to rule out MS.    In August, she developed pain in her knee and gastrocnemii, thoracolumbar pain and left sacral and gluteal pain.    REVIEW OF SYSTEMS:   A detailed Review of Systems sheet was reviewed with the patient.  Pertinent positives include glasses, swelling in feet or hands, leg pain while walking, asthma, shortness of breath, arm weakness, leg weakness, back pain, arm pain, leg pain, joint pain or swelling, arthritis bilateral knees, neck pain, problems with recent memory, double or blurred vision, anxiety and depression, other psychiatric disorder, and inhalant allergies.   All other systems are negative; this includes Constitutional symptoms, Ears, nose, mouth, throat, Endocrine, Gastrointestinal, Genitourinary, Integumentary & Breast, Hematologic/Lymphatic, and Immunologic.    PAST MEDICAL HISTORY:  Current Medical Conditions:    As previously described.       Prior Operations and Hospitalizations:   As previously described.     Medications and Allergies:  ProAir FHA 90 mcg inhaler 2 puffs q.4.h. p.r.n., Microgestin 21 1.5/30 continuous, Cymbalta 50 mg. 2 per day, Lyrica 30 mg. q.a.m. and q.p.m., Lamictal 200 mg. q.a.m. and q.p.m., Methylphenidate 20 mg. 1  q.a.m. and 1  q.p.m., Alprazolam 1 mg. t.i.d., Clonazepam t.i.d., Losartan 25 mg. q.a.m. and q.p.m., Klor-Con 20 mEq q.d., Furosemide 40 mg q.a.m., and q.p.m.,  Tramadol 50 mg. 2 q.i.d., and Meloxicam 15 mg. q.d.  Multivitamins q.d. Vitamin C 500 mg. q.d., and Zinc 50 mg. q.d.      Height and Weight:     She is currently 5'5" tall, 349 pounds with a BMI of 58.1.    FAMILY HISTORY:    Mother was 68 and died from complications of MS.  Father is 63 and she is not sure of his health history.      SOCIAL HISTORY:    She denies tobacco or drug use.  She is a rare drinker of alcoholic beverages.    DIAGNOSTIC STUDIES:   MRI of the cervical spine including MRI of the brain was which demonstrated a few punctate, nonspecific foci of supratentorial gliosis in the subcortical and juxtacortical white matter which were felt to be nonspecific lesions.  She had MRI of the cervical spine which showed C4-5 bulging of the disc with moderate to severe spinal stenosis.  It was felt that the AP diameter of the spinal canal was less than 5 mm, at C5-6 there was felt to be leftward T2 hyperintensity within the spinal cord.  However per my review of the MRI, I did not feel this was the case.  I felt there was a lot of motion artifact on the imaging and I did not feel that there was severe spinal stenosis on these images.  I also felt it was not possible to assess the severity of the canal stenosis.    We discussed myelography but I told her that this was not without risk and it may make more sense to repeat her MRI and see if we can get a better look at her cervical spinal cord.  She has not had recent EMG and nerve conduction velocities of her upper extremities and I told her that I thought it would be reasonable to obtain EMB and NCV's and would set this up with Dr. Murray Hodgkins in addition to his assisting her with pain management.      PHYSICAL EXAMINATION:      General Appearance:   [name] is a morbidly obese woman in no acute distress.        Blood Pressure, Pulse, Respirations: 114/80, heart rate 80 and regular, respirations 20.      HEENT - normocephalic, atraumatic.   The pupils are equal, round and reactive to light.  The extraocular muscles are intact.  Sclerae - white.  Conjunctiva - pink.  Oropharynx benign.  Uvula midline.     Neck - She complains of neck pain.  There is negative Lhermitte's sign with axial compression.  There is negative Spurlings' maneuver to either side.      Respiratory - there is normal respiratory effort with good intercostal function.  Lungs are clear to auscultation.  There are no rales, rhonchi or wheezes.      Cardiovascular - the heart has regular rate and rhythm to auscultation.  No murmurs are appreciated.  There is no extremity edema, cyanosis or clubbing.  There are palpable pedal pulses.      Abdomen - obese, soft, nontender, no hepatosplenomegaly appreciated or masses.  There are active bowel sounds.  No guarding or rebound.      Musculoskeletal Examination - the patient is able to walk about the examining room with a normal, casual, heel and toe gait.    NEUROLOGICAL EXAMINATION: The patient is oriented to time, person and place and has good recall of both recent and remote memory with normal attention span and concentration.  The patient speaks with clear and fluent speech and exhibits normal language function and appropriate fund of knowledge.      Cranial Nerve Examination - pupils are equal, round and reactive to light.  Extraocular movements are full.  Visual fields are full to confrontational testing.  Facial sensation and facial movement are symmetric and intact.  Hearing is intact to finger rub.  Palate is upgoing.  Shoulder shrug is symmetric.  Tongue protrudes in the midline.      Motor Examination - motor strength is 5/5 in the bilateral deltoids, biceps, triceps, handgrips, wrist extensors, interosseous.  In the lower extremities motor strength is 5/5 in hip flexion, extension, quadriceps, hamstrings, plantar flexion, dorsiflexion and extensor hallucis longus.      Sensory Examination - she complains of  numbness in the first, second and third digits on the left.      Deep Tendon Reflexes - 2 in the biceps, triceps, and brachioradialis, 2 in the knees, 2 in the ankles.  The great toes are downgoing to plantar stimulation.  No pathologic reflexes.        Cerebellar Examination - normal coordination in upper and lower extremities and normal rapid alternating movements.  Romberg test is negative.    IMPRESSION AND RECOMMENDATIONS:  [name] is a 41 year old morbidly obese woman with questionable cervical stenosis and carpal tunnel release.  I have recommended she get repeat evaluation with MRI of her cervical spine to assess for cord compression.  I would also like for her to see Dr. Murray Hodgkins for pain management and so that he can address for carpal tunnel syndrome with EMG's and NCV's.  She will followup with me after repeat MRI has been done of her cervical spinal cord.     [facility]    [name]. [name], M.D.

## 2011-09-11 ENCOUNTER — Inpatient Hospital Stay (HOSPITAL_COMMUNITY): Payer: 59 | Admitting: *Deleted

## 2011-09-11 ENCOUNTER — Encounter (HOSPITAL_COMMUNITY): Payer: Self-pay

## 2011-09-11 ENCOUNTER — Inpatient Hospital Stay (HOSPITAL_COMMUNITY)
Admission: RE | Admit: 2011-09-11 | Discharge: 2011-09-13 | DRG: 472 | Disposition: A | Discharge status: Home / Self Care | Payer: 59 | Source: Ambulatory Visit | Attending: Neurosurgery | Admitting: Neurosurgery

## 2011-09-11 ENCOUNTER — Inpatient Hospital Stay (HOSPITAL_COMMUNITY): Payer: 59

## 2011-09-11 ENCOUNTER — Encounter (HOSPITAL_COMMUNITY): Payer: Self-pay | Admitting: *Deleted

## 2011-09-11 ENCOUNTER — Encounter (HOSPITAL_COMMUNITY)
Admission: RE | Disposition: A | Discharge status: Home / Self Care | Payer: Self-pay | Source: Ambulatory Visit | Attending: Neurosurgery

## 2011-09-11 DIAGNOSIS — M488X2 Other specified spondylopathies, cervical region: Secondary | ICD-10-CM | POA: Diagnosis not present

## 2011-09-11 DIAGNOSIS — M5000 Cervical disc disorder with myelopathy, unspecified cervical region: Secondary | ICD-10-CM | POA: Diagnosis present

## 2011-09-11 DIAGNOSIS — F319 Bipolar disorder, unspecified: Secondary | ICD-10-CM | POA: Diagnosis present

## 2011-09-11 DIAGNOSIS — G5600 Carpal tunnel syndrome, unspecified upper limb: Secondary | ICD-10-CM | POA: Diagnosis present

## 2011-09-11 DIAGNOSIS — M5412 Radiculopathy, cervical region: Secondary | ICD-10-CM | POA: Diagnosis not present

## 2011-09-11 DIAGNOSIS — Z6841 Body Mass Index (BMI) 40.0 and over, adult: Secondary | ICD-10-CM

## 2011-09-11 DIAGNOSIS — F411 Generalized anxiety disorder: Secondary | ICD-10-CM | POA: Diagnosis present

## 2011-09-11 DIAGNOSIS — M4712 Other spondylosis with myelopathy, cervical region: Secondary | ICD-10-CM | POA: Diagnosis not present

## 2011-09-11 DIAGNOSIS — Z79899 Other long term (current) drug therapy: Secondary | ICD-10-CM

## 2011-09-11 DIAGNOSIS — J45909 Unspecified asthma, uncomplicated: Secondary | ICD-10-CM | POA: Diagnosis present

## 2011-09-11 DIAGNOSIS — F4001 Agoraphobia with panic disorder: Secondary | ICD-10-CM | POA: Diagnosis present

## 2011-09-11 DIAGNOSIS — I509 Heart failure, unspecified: Secondary | ICD-10-CM | POA: Diagnosis present

## 2011-09-11 DIAGNOSIS — M4802 Spinal stenosis, cervical region: Secondary | ICD-10-CM | POA: Diagnosis not present

## 2011-09-11 HISTORY — PX: POSTERIOR CERVICAL FUSION/FORAMINOTOMY: SHX5038

## 2011-09-11 LAB — NO BLOOD PRODUCTS

## 2011-09-11 SURGERY — POSTERIOR CERVICAL FUSION/FORAMINOTOMY LEVEL 1
Anesthesia: General | Site: Neck | Wound class: Clean

## 2011-09-11 MED ORDER — HYDROCODONE-ACETAMINOPHEN 5-325 MG PO TABS
1.0000 | ORAL_TABLET | ORAL | Status: DC | PRN
  Administered 2011-09-11 – 2011-09-12 (×3): 2 via ORAL
  Filled 2011-09-11 (×4): qty 2

## 2011-09-11 MED ORDER — LABETALOL HCL 5 MG/ML IV SOLN
5.0000 mg | INTRAVENOUS | Status: DC | PRN
  Administered 2011-09-11 (×2): 5 mg via INTRAVENOUS

## 2011-09-11 MED ORDER — PROPOFOL 10 MG/ML IV BOLUS
INTRAVENOUS | Status: DC | PRN
  Administered 2011-09-11: 300 mg via INTRAVENOUS

## 2011-09-11 MED ORDER — ONDANSETRON HCL 4 MG/2ML IJ SOLN: 4.0000 mg | Freq: Once | INTRAMUSCULAR | Status: DC | PRN

## 2011-09-11 MED ORDER — SODIUM CHLORIDE 0.9 % IV SOLN: 250.0000 mL | INTRAVENOUS | Status: DC

## 2011-09-11 MED ORDER — LIDOCAINE HCL (CARDIAC) 20 MG/ML IV SOLN
INTRAVENOUS | Status: DC | PRN
  Administered 2011-09-11: 100 mg via INTRAVENOUS

## 2011-09-11 MED ORDER — ONDANSETRON HCL 4 MG/2ML IJ SOLN: 4.0000 mg | INTRAMUSCULAR | Status: DC | PRN

## 2011-09-11 MED ORDER — THROMBIN 5000 UNITS EX SOLR
CUTANEOUS | Status: DC | PRN
  Administered 2011-09-11 (×2): 5000 [IU] via TOPICAL

## 2011-09-11 MED ORDER — ADULT MULTIVITAMIN W/MINERALS CH
1.0000 | ORAL_TABLET | Freq: Every day | ORAL | Status: DC
  Administered 2011-09-11 – 2011-09-13 (×3): 1 via ORAL
  Filled 2011-09-11 (×3): qty 1

## 2011-09-11 MED ORDER — LACTATED RINGERS IV SOLN
INTRAVENOUS | Status: DC | PRN
  Administered 2011-09-11 (×2): via INTRAVENOUS

## 2011-09-11 MED ORDER — DEXAMETHASONE SODIUM PHOSPHATE 4 MG/ML IJ SOLN
INTRAMUSCULAR | Status: DC | PRN
  Administered 2011-09-11: 10 mg via INTRAVENOUS

## 2011-09-11 MED ORDER — ALBUTEROL SULFATE HFA 108 (90 BASE) MCG/ACT IN AERS
INHALATION_SPRAY | RESPIRATORY_TRACT | Status: DC | PRN
  Administered 2011-09-11: 5 via RESPIRATORY_TRACT

## 2011-09-11 MED ORDER — LIDOCAINE-EPINEPHRINE 1 %-1:100000 IJ SOLN
INTRAMUSCULAR | Status: DC | PRN
  Administered 2011-09-11: 10 mL

## 2011-09-11 MED ORDER — BACITRACIN ZINC 500 UNIT/GM EX OINT
TOPICAL_OINTMENT | CUTANEOUS | Status: DC | PRN
  Administered 2011-09-11: 1 via TOPICAL

## 2011-09-11 MED ORDER — DULOXETINE HCL 60 MG PO CPEP
120.0000 mg | ORAL_CAPSULE | Freq: Every day | ORAL | Status: DC
  Administered 2011-09-12 – 2011-09-13 (×2): 120 mg via ORAL
  Filled 2011-09-11 (×2): qty 2

## 2011-09-11 MED ORDER — MORPHINE SULFATE 2 MG/ML IJ SOLN
1.0000 mg | INTRAMUSCULAR | Status: DC | PRN
  Administered 2011-09-11 – 2011-09-12 (×3): 2 mg via INTRAVENOUS
  Administered 2011-09-12: 4 mg via INTRAVENOUS
  Administered 2011-09-12 (×2): 2 mg via INTRAVENOUS
  Filled 2011-09-11: qty 1
  Filled 2011-09-11: qty 2
  Filled 2011-09-11 (×4): qty 1

## 2011-09-11 MED ORDER — SODIUM CHLORIDE 0.9 % IJ SOLN: 3.0000 mL | INTRAMUSCULAR | Status: DC | PRN

## 2011-09-11 MED ORDER — LACTATED RINGERS IV SOLN
INTRAVENOUS | Status: DC | PRN
  Administered 2011-09-11 (×2): via INTRAVENOUS

## 2011-09-11 MED ORDER — FUROSEMIDE 20 MG PO TABS
10.0000 mg | ORAL_TABLET | Freq: Every day | ORAL | Status: DC
  Administered 2011-09-11 – 2011-09-13 (×3): 10 mg via ORAL
  Filled 2011-09-11 (×3): qty 0.5

## 2011-09-11 MED ORDER — ALUM & MAG HYDROXIDE-SIMETH 200-200-20 MG/5ML PO SUSP: 30.0000 mL | Freq: Four times a day (QID) | ORAL | Status: DC | PRN

## 2011-09-11 MED ORDER — LORATADINE 10 MG PO TABS
10.0000 mg | ORAL_TABLET | Freq: Every day | ORAL | Status: DC
  Administered 2011-09-12 – 2011-09-13 (×2): 10 mg via ORAL
  Filled 2011-09-11 (×2): qty 1

## 2011-09-11 MED ORDER — SUFENTANIL CITRATE 50 MCG/ML IV SOLN
INTRAVENOUS | Status: DC | PRN
  Administered 2011-09-11: 10 ug via INTRAVENOUS
  Administered 2011-09-11: 20 ug via INTRAVENOUS
  Administered 2011-09-11 (×2): 10 ug via INTRAVENOUS

## 2011-09-11 MED ORDER — KCL IN DEXTROSE-NACL 20-5-0.45 MEQ/L-%-% IV SOLN
INTRAVENOUS | Status: DC
  Administered 2011-09-11: 20:00:00 via INTRAVENOUS
  Filled 2011-09-11 (×3): qty 1000

## 2011-09-11 MED ORDER — DIAZEPAM 5 MG PO TABS
5.0000 mg | ORAL_TABLET | Freq: Four times a day (QID) | ORAL | Status: DC | PRN
  Administered 2011-09-11 – 2011-09-13 (×5): 5 mg via ORAL
  Filled 2011-09-11 (×5): qty 1

## 2011-09-11 MED ORDER — LOSARTAN POTASSIUM 25 MG PO TABS
25.0000 mg | ORAL_TABLET | Freq: Two times a day (BID) | ORAL | Status: DC
  Administered 2011-09-11 – 2011-09-13 (×4): 25 mg via ORAL
  Filled 2011-09-11 (×5): qty 1

## 2011-09-11 MED ORDER — PHENOL 1.4 % MT LIQD
1.0000 | OROMUCOSAL | Status: DC | PRN
  Filled 2011-09-11: qty 177

## 2011-09-11 MED ORDER — ACETAMINOPHEN 10 MG/ML IV SOLN
1000.0000 mg | Freq: Once | INTRAVENOUS | Status: AC
  Administered 2011-09-11: 1000 mg via INTRAVENOUS

## 2011-09-11 MED ORDER — ONE-DAILY MULTI VITAMINS PO TABS: 1.0000 | ORAL_TABLET | Freq: Every day | ORAL | Status: DC

## 2011-09-11 MED ORDER — ALPRAZOLAM 0.5 MG PO TABS
1.0000 mg | ORAL_TABLET | Freq: Three times a day (TID) | ORAL | Status: DC
  Administered 2011-09-11 – 2011-09-13 (×5): 1 mg via ORAL
  Filled 2011-09-11 (×2): qty 2
  Filled 2011-09-11 (×2): qty 1
  Filled 2011-09-11: qty 2
  Filled 2011-09-11: qty 1

## 2011-09-11 MED ORDER — SENNA 8.6 MG PO TABS
1.0000 | ORAL_TABLET | Freq: Two times a day (BID) | ORAL | Status: DC
  Administered 2011-09-11: 8.6 mg via ORAL
  Filled 2011-09-11 (×6): qty 1

## 2011-09-11 MED ORDER — NEOSTIGMINE METHYLSULFATE 1 MG/ML IJ SOLN
INTRAMUSCULAR | Status: DC | PRN
  Administered 2011-09-11: 5 mg via INTRAVENOUS

## 2011-09-11 MED ORDER — ACETAMINOPHEN 10 MG/ML IV SOLN
INTRAVENOUS | Status: AC
  Administered 2011-09-11: 1000 mg via INTRAVENOUS
  Filled 2011-09-11: qty 100

## 2011-09-11 MED ORDER — CLONAZEPAM 1 MG PO TABS
1.0000 mg | ORAL_TABLET | Freq: Three times a day (TID) | ORAL | Status: DC
  Administered 2011-09-11 – 2011-09-13 (×5): 1 mg via ORAL
  Filled 2011-09-11 (×6): qty 1

## 2011-09-11 MED ORDER — HYDROMORPHONE HCL PF 1 MG/ML IJ SOLN
INTRAMUSCULAR | Status: AC
  Administered 2011-09-11: 0.5 mg via INTRAVENOUS
  Filled 2011-09-11: qty 1

## 2011-09-11 MED ORDER — ALBUTEROL SULFATE HFA 108 (90 BASE) MCG/ACT IN AERS: 1.0000 | INHALATION_SPRAY | RESPIRATORY_TRACT | Status: DC | PRN

## 2011-09-11 MED ORDER — BIOTENE DRY MOUTH MT LIQD: 15.0000 mL | Freq: Two times a day (BID) | OROMUCOSAL | Status: DC

## 2011-09-11 MED ORDER — ACETAMINOPHEN 650 MG RE SUPP: 650.0000 mg | RECTAL | Status: DC | PRN

## 2011-09-11 MED ORDER — VECURONIUM BROMIDE 10 MG IV SOLR
INTRAVENOUS | Status: DC | PRN
  Administered 2011-09-11: 10 mg via INTRAVENOUS

## 2011-09-11 MED ORDER — PANTOPRAZOLE SODIUM 40 MG IV SOLR
40.0000 mg | Freq: Every day | INTRAVENOUS | Status: DC
Start: 2011-09-11 — End: 2011-09-13
  Administered 2011-09-11: 40 mg via INTRAVENOUS
  Filled 2011-09-11 (×3): qty 40

## 2011-09-11 MED ORDER — MIDAZOLAM HCL 5 MG/5ML IJ SOLN
INTRAMUSCULAR | Status: DC | PRN
  Administered 2011-09-11 (×2): 1 mg via INTRAVENOUS

## 2011-09-11 MED ORDER — 0.9 % SODIUM CHLORIDE (POUR BTL) OPTIME
TOPICAL | Status: DC | PRN
  Administered 2011-09-11: 1000 mL

## 2011-09-11 MED ORDER — MORPHINE SULFATE 2 MG/ML IJ SOLN
INTRAMUSCULAR | Status: AC
  Filled 2011-09-11: qty 1

## 2011-09-11 MED ORDER — PREGABALIN 50 MG PO CAPS: 100.0000 mg | ORAL_CAPSULE | Freq: Two times a day (BID) | ORAL | Status: DC

## 2011-09-11 MED ORDER — BUPIVACAINE HCL (PF) 0.5 % IJ SOLN
INTRAMUSCULAR | Status: DC | PRN
  Administered 2011-09-11: 10 mL

## 2011-09-11 MED ORDER — TRAMADOL HCL 50 MG PO TABS
100.0000 mg | ORAL_TABLET | Freq: Four times a day (QID) | ORAL | Status: DC | PRN
  Administered 2011-09-12 – 2011-09-13 (×4): 100 mg via ORAL
  Filled 2011-09-11 (×4): qty 2

## 2011-09-11 MED ORDER — ZOLPIDEM TARTRATE 5 MG PO TABS: 5.0000 mg | ORAL_TABLET | Freq: Every evening | ORAL | Status: DC | PRN

## 2011-09-11 MED ORDER — PREGABALIN 50 MG PO CAPS
100.0000 mg | ORAL_CAPSULE | Freq: Two times a day (BID) | ORAL | Status: DC
  Administered 2011-09-11 – 2011-09-13 (×4): 100 mg via ORAL
  Filled 2011-09-11 (×4): qty 2

## 2011-09-11 MED ORDER — POTASSIUM CHLORIDE CRYS ER 10 MEQ PO TBCR
10.0000 meq | EXTENDED_RELEASE_TABLET | Freq: Every day | ORAL | Status: DC
  Administered 2011-09-12 – 2011-09-13 (×2): 10 meq via ORAL
  Filled 2011-09-11 (×2): qty 1

## 2011-09-11 MED ORDER — GLYCOPYRROLATE 0.2 MG/ML IJ SOLN
INTRAMUSCULAR | Status: DC | PRN
  Administered 2011-09-11: .6 mg via INTRAVENOUS

## 2011-09-11 MED ORDER — SODIUM CHLORIDE 0.9 % IV SOLN
INTRAVENOUS | Status: AC
  Filled 2011-09-11: qty 500

## 2011-09-11 MED ORDER — SODIUM CHLORIDE 0.9 % IJ SOLN
3.0000 mL | Freq: Two times a day (BID) | INTRAMUSCULAR | Status: DC
  Administered 2011-09-11 – 2011-09-12 (×2): 3 mL via INTRAVENOUS

## 2011-09-11 MED ORDER — VITAMIN C 500 MG PO TABS
500.0000 mg | ORAL_TABLET | Freq: Every day | ORAL | Status: DC
  Administered 2011-09-12 – 2011-09-13 (×2): 500 mg via ORAL
  Filled 2011-09-11 (×2): qty 1

## 2011-09-11 MED ORDER — LABETALOL HCL 5 MG/ML IV SOLN: 10.0000 mg | INTRAVENOUS | Status: DC | PRN

## 2011-09-11 MED ORDER — DOCUSATE SODIUM 100 MG PO CAPS
100.0000 mg | ORAL_CAPSULE | Freq: Two times a day (BID) | ORAL | Status: DC
  Administered 2011-09-11: 100 mg via ORAL
  Filled 2011-09-11 (×4): qty 1

## 2011-09-11 MED ORDER — CEFAZOLIN SODIUM 1-5 GM-% IV SOLN
1.0000 g | Freq: Three times a day (TID) | INTRAVENOUS | Status: AC
  Administered 2011-09-11 – 2011-09-12 (×2): 1 g via INTRAVENOUS
  Filled 2011-09-11 (×2): qty 50

## 2011-09-11 MED ORDER — HYDROMORPHONE HCL PF 1 MG/ML IJ SOLN
0.2500 mg | INTRAMUSCULAR | Status: DC | PRN
  Administered 2011-09-11 (×3): 0.5 mg via INTRAVENOUS

## 2011-09-11 MED ORDER — OXYCODONE-ACETAMINOPHEN 5-325 MG PO TABS
1.0000 | ORAL_TABLET | ORAL | Status: DC | PRN
  Administered 2011-09-11: 2 via ORAL
  Administered 2011-09-12: 1 via ORAL
  Administered 2011-09-12 – 2011-09-13 (×4): 2 via ORAL
  Filled 2011-09-11 (×6): qty 2

## 2011-09-11 MED ORDER — ACETAMINOPHEN 325 MG PO TABS: 650.0000 mg | ORAL_TABLET | ORAL | Status: DC | PRN

## 2011-09-11 MED ORDER — METHYLPHENIDATE HCL 5 MG PO TABS
10.0000 mg | ORAL_TABLET | Freq: Two times a day (BID) | ORAL | Status: DC
Start: 2011-09-12 — End: 2011-09-13
  Administered 2011-09-12 – 2011-09-13 (×3): 10 mg via ORAL
  Filled 2011-09-11 (×3): qty 2

## 2011-09-11 MED ORDER — SODIUM CHLORIDE 0.9 % IR SOLN
Status: DC | PRN
  Administered 2011-09-11: 15:00:00

## 2011-09-11 MED ORDER — BACITRACIN 50000 UNITS IM SOLR
INTRAMUSCULAR | Status: AC
  Filled 2011-09-11: qty 1

## 2011-09-11 MED ORDER — POTASSIUM CHLORIDE CRYS ER 20 MEQ PO TBCR: 20.0000 meq | EXTENDED_RELEASE_TABLET | Freq: Every day | ORAL | Status: DC

## 2011-09-11 MED ORDER — LAMOTRIGINE 200 MG PO TABS
200.0000 mg | ORAL_TABLET | Freq: Two times a day (BID) | ORAL | Status: DC
  Administered 2011-09-11 – 2011-09-12 (×3): 200 mg via ORAL
  Filled 2011-09-11 (×6): qty 1

## 2011-09-11 MED ORDER — SUCCINYLCHOLINE CHLORIDE 20 MG/ML IJ SOLN
INTRAMUSCULAR | Status: DC | PRN
  Administered 2011-09-11: 50 mg via INTRAVENOUS
  Administered 2011-09-11: 180 mg via INTRAVENOUS

## 2011-09-11 MED ORDER — LABETALOL HCL 5 MG/ML IV SOLN
INTRAVENOUS | Status: AC
  Administered 2011-09-11: 5 mg via INTRAVENOUS
  Filled 2011-09-11: qty 4

## 2011-09-11 MED ORDER — NORETHIN ACE-ETH ESTRAD-FE 1.5-30 MG-MCG PO TABS: 1.0000 | ORAL_TABLET | Freq: Every day | ORAL | Status: DC

## 2011-09-11 MED ORDER — HEMOSTATIC AGENTS (NO CHARGE) OPTIME
TOPICAL | Status: DC | PRN
  Administered 2011-09-11: 1 via TOPICAL

## 2011-09-11 MED ORDER — MENTHOL 3 MG MT LOZG: 1.0000 | LOZENGE | OROMUCOSAL | Status: DC | PRN

## 2011-09-11 MED ORDER — FLUTICASONE PROPIONATE 50 MCG/ACT NA SUSP
2.0000 | Freq: Every day | NASAL | Status: DC
  Administered 2011-09-11: 2 via NASAL
  Filled 2011-09-11: qty 16

## 2011-09-11 MED ORDER — DEXAMETHASONE SODIUM PHOSPHATE 4 MG/ML IJ SOLN
4.0000 mg | Freq: Four times a day (QID) | INTRAMUSCULAR | Status: AC
  Administered 2011-09-11 – 2011-09-12 (×3): 4 mg via INTRAVENOUS
  Filled 2011-09-11 (×3): qty 1

## 2011-09-11 SURGICAL SUPPLY — 76 items
BAG DECANTER FOR FLEXI CONT (MISCELLANEOUS) ×2 IMPLANT
BENZOIN TINCTURE PRP APPL 2/3 (GAUZE/BANDAGES/DRESSINGS) ×8 IMPLANT
BIT DRILL NEURO 2X3.1 SFT TUCH (MISCELLANEOUS) IMPLANT
BLADE SURG 11 STRL SS (BLADE) ×2 IMPLANT
BLADE SURG ROTATE 9660 (MISCELLANEOUS) ×2 IMPLANT
BLADE ULTRA TIP 2M (BLADE) IMPLANT
BUR PRECISION FLUTE 5.0 (BURR) ×2 IMPLANT
CANISTER SUCTION 2500CC (MISCELLANEOUS) ×2 IMPLANT
CLOTH BEACON ORANGE TIMEOUT ST (SAFETY) ×2 IMPLANT
CONT SPEC 4OZ CLIKSEAL STRL BL (MISCELLANEOUS) ×4 IMPLANT
DRAPE C-ARM 42X72 X-RAY (DRAPES) ×4 IMPLANT
DRAPE LAPAROTOMY 100X72 PEDS (DRAPES) ×2 IMPLANT
DRAPE MICROSCOPE LEICA (MISCELLANEOUS) IMPLANT
DRAPE POUCH INSTRU U-SHP 10X18 (DRAPES) ×2 IMPLANT
DRESSING TELFA 8X3 (GAUZE/BANDAGES/DRESSINGS) ×2 IMPLANT
DRILL 12MM (DRILL) ×2 IMPLANT
DRILL NEURO 2X3.1 SOFT TOUCH (MISCELLANEOUS)
DRSG PAD ABDOMINAL 8X10 ST (GAUZE/BANDAGES/DRESSINGS) IMPLANT
DURAPREP 6ML APPLICATOR 50/CS (WOUND CARE) ×2 IMPLANT
ELECT BLADE 4.0 EZ CLEAN MEGAD (MISCELLANEOUS) ×2
ELECT REM PT RETURN 9FT ADLT (ELECTROSURGICAL) ×2
ELECTRODE BLDE 4.0 EZ CLN MEGD (MISCELLANEOUS) ×1 IMPLANT
ELECTRODE REM PT RTRN 9FT ADLT (ELECTROSURGICAL) ×1 IMPLANT
EVACUATOR 1/8 PVC DRAIN (DRAIN) ×2 IMPLANT
GAUZE SPONGE 4X4 16PLY XRAY LF (GAUZE/BANDAGES/DRESSINGS) IMPLANT
GLOVE BIO SURGEON STRL SZ8 (GLOVE) ×2 IMPLANT
GLOVE BIOGEL PI IND STRL 7.5 (GLOVE) ×1 IMPLANT
GLOVE BIOGEL PI IND STRL 8 (GLOVE) ×1 IMPLANT
GLOVE BIOGEL PI IND STRL 8.5 (GLOVE) ×1 IMPLANT
GLOVE BIOGEL PI INDICATOR 7.5 (GLOVE) ×1
GLOVE BIOGEL PI INDICATOR 8 (GLOVE) ×1
GLOVE BIOGEL PI INDICATOR 8.5 (GLOVE) ×1
GLOVE ECLIPSE 7.5 STRL STRAW (GLOVE) ×6 IMPLANT
GLOVE ECLIPSE 8.0 STRL XLNG CF (GLOVE) ×2 IMPLANT
GLOVE EXAM NITRILE LRG STRL (GLOVE) IMPLANT
GLOVE EXAM NITRILE MD LF STRL (GLOVE) ×4 IMPLANT
GLOVE EXAM NITRILE XL STR (GLOVE) IMPLANT
GLOVE EXAM NITRILE XS STR PU (GLOVE) IMPLANT
GOWN BRE IMP SLV AUR LG STRL (GOWN DISPOSABLE) ×6 IMPLANT
GOWN BRE IMP SLV AUR XL STRL (GOWN DISPOSABLE) ×4 IMPLANT
GOWN STRL REIN 2XL LVL4 (GOWN DISPOSABLE) ×2 IMPLANT
HEMOSTAT SURGICEL 2X14 (HEMOSTASIS) IMPLANT
KIT BASIN OR (CUSTOM PROCEDURE TRAY) ×2 IMPLANT
KIT INFUSE X SMALL 1.4CC (Orthopedic Implant) ×2 IMPLANT
KIT ROOM TURNOVER OR (KITS) ×2 IMPLANT
MARKER SKIN DUAL TIP RULER LAB (MISCELLANEOUS) ×2 IMPLANT
NEEDLE HYPO 18GX1.5 BLUNT FILL (NEEDLE) IMPLANT
NEEDLE HYPO 25X1 1.5 SAFETY (NEEDLE) ×2 IMPLANT
NS IRRIG 1000ML POUR BTL (IV SOLUTION) ×2 IMPLANT
PACK LAMINECTOMY NEURO (CUSTOM PROCEDURE TRAY) ×2 IMPLANT
PIN MAYFIELD SKULL DISP (PIN) ×2 IMPLANT
ROD 120MM (Rod) ×1 IMPLANT
ROD SPNL 120X3.3XNS LF CVD (Rod) ×1 IMPLANT
RUBBERBAND STERILE (MISCELLANEOUS) IMPLANT
SCREW POLYAXIAL 12MM (Screw) ×8 IMPLANT
SCREW SET (Screw) ×8 IMPLANT
SPONGE GAUZE 4X4 12PLY (GAUZE/BANDAGES/DRESSINGS) ×2 IMPLANT
SPONGE INTESTINAL PEANUT (DISPOSABLE) IMPLANT
SPONGE SURGIFOAM ABS GEL SZ50 (HEMOSTASIS) ×2 IMPLANT
STAPLER SKIN PROX WIDE 3.9 (STAPLE) ×2 IMPLANT
STRIP CLOSURE SKIN 1/2X4 (GAUZE/BANDAGES/DRESSINGS) ×4 IMPLANT
STRIP NEXOSS 5CC (Neuro Prosthesis/Implant) ×2 IMPLANT
SUT ETHILON 3 0 FSL (SUTURE) ×2 IMPLANT
SUT VIC AB 0 CT1 18XCR BRD8 (SUTURE) ×2 IMPLANT
SUT VIC AB 0 CT1 8-18 (SUTURE) ×2
SUT VIC AB 2-0 CP2 18 (SUTURE) ×2 IMPLANT
SUT VIC AB 3-0 SH 8-18 (SUTURE) ×4 IMPLANT
SYR 20ML ECCENTRIC (SYRINGE) ×2 IMPLANT
SYR 3ML LL SCALE MARK (SYRINGE) IMPLANT
TAPE CLOTH SURG 4X10 WHT LF (GAUZE/BANDAGES/DRESSINGS) ×2 IMPLANT
TOWEL OR 17X24 6PK STRL BLUE (TOWEL DISPOSABLE) ×2 IMPLANT
TOWEL OR 17X26 10 PK STRL BLUE (TOWEL DISPOSABLE) ×2 IMPLANT
TRAP SPECIMEN MUCOUS 40CC (MISCELLANEOUS) ×2 IMPLANT
TRAY FOLEY CATH 14FRSI W/METER (CATHETERS) ×2 IMPLANT
UNDERPAD 30X30 INCONTINENT (UNDERPADS AND DIAPERS) IMPLANT
WATER STERILE IRR 1000ML POUR (IV SOLUTION) ×2 IMPLANT

## 2011-09-11 NOTE — Interval H&P Note (Signed)
History and Physical Interval Note:  09/11/2011 6:35 AM  [name]  has presented today for surgery, with the diagnosis of Cervical spondylosis with myelopathy, Cervical stenosis, Cervical radiculopathy, Cervical ossification of posterior longitudinal ligament  The various methods of treatment have been discussed with the patient and family. After consideration of risks, benefits and other options for treatment, the patient has consented to  Procedure(s) (LRB): POSTERIOR CERVICAL FUSION/FORAMINOTOMY LEVEL 1 (N/A) as a surgical intervention .  The patient's history has been reviewed, patient examined, no change in status, stable for surgery.  I have reviewed the patient's chart and labs.  Questions were answered to the patient's satisfaction.     Josede Cicero D  Date of Initial H&P: 09/10/2011  History reviewed, patient examined, no change in status, stable for surgery.

## 2011-09-11 NOTE — Anesthesia Postprocedure Evaluation (Signed)
  Anesthesia Post-op Note  Patient: [name]  Procedure(s) Performed: Procedure(s) (LRB): POSTERIOR CERVICAL FUSION/FORAMINOTOMY LEVEL 1 (N/A)  Patient Location: PACU  Anesthesia Type: General  Level of Consciousness: awake  Airway and Oxygen Therapy: Patient Spontanous Breathing and Patient connected to nasal cannula oxygen  Post-op Pain: moderate  Post-op Assessment: Post-op Vital signs reviewed, Patient's Cardiovascular Status Stable, Respiratory Function Stable, Patent Airway and No signs of Nausea or vomiting  Post-op Vital Signs: Reviewed and stable  Complications: No apparent anesthesia complications

## 2011-09-11 NOTE — Preoperative (Signed)
Beta Blockers   Reason not to administer Beta Blockers:Not Applicable 

## 2011-09-11 NOTE — Op Note (Signed)
09/11/2011  4:05 PM  PATIENT:  [name]  41 y.o. female  PRE-OPERATIVE DIAGNOSIS:  Cervical spondylosis with myelopathy, Cervical stenosis, Cervical radiculopathy, Cervical ossification of posterior longitudinal ligament, morbid obesity  POST-OPERATIVE DIAGNOSIS:  Cervical spondylosis with myelopathy, Cervical stenosis, Cervical radiculopathy, Cervical ossification of posterior longitudinal ligament, morbid obesity  PROCEDURE:  Procedure(s) (LRB): POSTERIOR CERVICAL Decompression C2-5 FUSION With lateral mass screws and autograft/allograft (N/A)  SURGEON:  Surgeon(s) and Role:    * Maeola Harman, MD - Primary    * Reinaldo Meeker, MD - Assisting  PHYSICIAN ASSISTANT:   ASSISTANTS: Poteat, RN   ANESTHESIA:   general  EBL:  Total I/O In: 2200 [I.V.:2200] Out: 300 [Urine:250; Blood:50]  BLOOD ADMINISTERED:none  DRAINS: (Medium) Hemovact drain(s) in the Epidural space with  Suction Open   LOCAL MEDICATIONS USED:  LIDOCAINE   SPECIMEN:  No Specimen  DISPOSITION OF SPECIMEN:  N/A  COUNTS:  YES  TOURNIQUET:  * No tourniquets in log *  DICTATION: DICTATION: Patient is 41 year old woman with cervical stenosis and OPLL from C2 through C5 with a 4 mm spinal canal at the C3/4 level. She has a cervical myelopathy It was elected to take the patient to surgery for posterior cervical decompression and fusion.  She is morbidly obese.  PROCEDURE: Patient was brought to the OR and following smooth and uncomplicated induction of GETA, patient was placed in 3 pin fixation and rolled into a prone position on the OR table.  Shoulders were taped to facilitate Xray visualization. Posterior neck was prepped and draped in the usual fashion.  Area of planned incision was infiltrated with local lidocaine.  Skin was prepped with betadine scrub and paint and Duraprep. Incision was made from and carried through the avascular midline plane to expose these lavels and their lateral masses of C2  through C4.    Lateral mass screws were placed from C3 to C4 bilaterally according to standard landmarks.  I could not visualize the operated levels with fluroscopy and was only able to definitively count C1 with lateral Xray. The facet joints and laminae were decorticated and packed with extra small BMP and NexOss bone graft extender and bone autograft.  Screws and rods were locked down in situ.  A total laminectomy of C4 and C3 with significant undercutting of C2. A medium Hemovac drain was placed in the epidural space. Wound was closed with 0, 2-0, and 3-0 vicryl sutures.  Sterile occlusive dressing was placed.  Patient was taken out of pins and turned back onto the OR table.  Patient was extubated in the operating room and taken to recovery in stable and satisfactory condition.  Counts were correct at the end of the case.  PLAN OF CARE: Admit to inpatient   PATIENT DISPOSITION:  PACU - hemodynamically stable.   Delay start of Pharmacological VTE agent (>24hrs) due to surgical blood loss or risk of bleeding: yes

## 2011-09-11 NOTE — Progress Notes (Signed)
Awake, alert, conversant.  Full strength bilateral D/B/T?Grips.  MAEW with good strength in legs.  C/o numbness in hands (but had preop as well).

## 2011-09-11 NOTE — Progress Notes (Signed)
Pt. Agrees to sign surgical consent today, also signed refusal for blood transfusion. Faxed copy of refusal to blood bank.

## 2011-09-11 NOTE — Anesthesia Preprocedure Evaluation (Addendum)
Anesthesia Evaluation  Patient identified by MRN, date of birth, ID band Patient awake    Reviewed: Allergy & Precautions, H&P , NPO status , Patient's Chart, lab work & pertinent test results  Airway Mallampati: I TM Distance: >3 FB Neck ROM: Full    Dental  (+) Teeth Intact and Dental Advisory Given   Pulmonary shortness of breath, with exertion, at rest and lying, sleep apnea and Continuous Positive Airway Pressure Ventilation ,  Obesity hypoventilaiton syndrome breath sounds clear to auscultation        Cardiovascular +CHF Rhythm:Regular Rate:Normal     Neuro/Psych    GI/Hepatic   Endo/Other  Morbid obesity  Renal/GU      Musculoskeletal   Abdominal   Peds  Hematology  (+) REFUSES BLOOD PRODUCTS, JEHOVAH'S WITNESSWill accept albumin and plasma   Anesthesia Other Findings   Reproductive/Obstetrics                         Anesthesia Physical Anesthesia Plan  ASA: III  Anesthesia Plan: General   Post-op Pain Management:    Induction: Intravenous  Airway Management Planned: Oral ETT  Additional Equipment:   Intra-op Plan:   Post-operative Plan: Extubation in OR  Informed Consent: I have reviewed the patients History and Physical, chart, labs and discussed the procedure including the risks, benefits and alternatives for the proposed anesthesia with the patient or authorized representative who has indicated his/her understanding and acceptance.   Dental advisory given  Plan Discussed with: CRNA, Anesthesiologist and Surgeon  Anesthesia Plan Comments:         Anesthesia Quick Evaluation

## 2011-09-11 NOTE — Transfer of Care (Signed)
Immediate Anesthesia Transfer of Care Note  Patient: [name]  Procedure(s) Performed: Procedure(s) (LRB): POSTERIOR CERVICAL FUSION/FORAMINOTOMY LEVEL 1 (N/A)  Patient Location: PACU  Anesthesia Type: General  Level of Consciousness: awake and alert   Airway & Oxygen Therapy: Patient Spontanous Breathing and Patient connected to face mask oxygen  Post-op Assessment: Report given to PACU RN and Post -op Vital signs reviewed and stable  Post vital signs: Reviewed and stable  Complications: No apparent anesthesia complications

## 2011-09-11 NOTE — Care Management (Signed)
Pt placed on home cpap for the night with 3 lt nasal cannula. Pt tol. Well.

## 2011-09-11 NOTE — Progress Notes (Signed)
Call to Dr. Ivin Booty, reported pt.'s comorbidities, to include her morbid obesity, pulm. Issues. Anesth. Team is aware.

## 2011-09-12 MED ORDER — DIPHENHYDRAMINE HCL 25 MG PO TABS
25.0000 mg | ORAL_TABLET | Freq: Four times a day (QID) | ORAL | Status: DC
  Administered 2011-09-12 – 2011-09-13 (×2): 25 mg via ORAL
  Filled 2011-09-12 (×10): qty 1

## 2011-09-12 MED ORDER — OXYCODONE HCL 5 MG PO TABS: 5.0000 mg | ORAL_TABLET | ORAL | Status: DC | PRN

## 2011-09-12 MED ORDER — HYDROMORPHONE HCL 2 MG PO TABS
2.0000 mg | ORAL_TABLET | ORAL | Status: DC | PRN
  Administered 2011-09-12 – 2011-09-13 (×7): 2 mg via ORAL
  Filled 2011-09-12 (×7): qty 1

## 2011-09-12 NOTE — Evaluation (Signed)
Occupational Therapy Evaluation Patient Details Name: [name] MRN: [id number] DOB: 1970-09-25 Today's Date: 09/12/2011 Time: 7829-5621 OT Time Calculation (min): 49 min  OT Assessment / Plan / Recommendation Clinical Impression  41 yo female s/p PCF that could benefit from skilled OT acutely. OT to follow acutely.    OT Assessment  Patient needs continued OT Services    Follow Up Recommendations  Home health OT    Barriers to Discharge      Equipment Recommendations  Defer to next venue    Recommendations for Other Services    Frequency  Min 2X/week    Precautions / Restrictions Precautions Precautions: Cervical Restrictions Weight Bearing Restrictions: No   Pertinent Vitals/Pain See vitals Severe pain in Lt UE    ADL  Grooming: Performed;Wash/dry hands;Minimal assistance Where Assessed - Grooming: Unsupported standing Upper Body Dressing: Performed;Minimal assistance Where Assessed - Upper Body Dressing: Unsupported sitting Lower Body Dressing: Performed;+1 Total assistance Where Assessed - Lower Body Dressing: Supine, head of bed up Toilet Transfer: Performed;Supervision/safety Toilet Transfer Method: Sit to Barista: Regular height toilet;Grab bars Toileting - Clothing Manipulation and Hygiene: Performed;+1 Total assistance (pt uses toilet aide at baseline) Where Assessed - Toileting Clothing Manipulation and Hygiene: Sit to stand from 3-in-1 or toilet (noted to have a small wound on Rt side of peri area ) Equipment Used: Other (comment) (aspen collar) Transfers/Ambulation Related to ADLs: Pt ambulated min guard (A) due to lines and leads. Pt ambulated to restroom and then to chair at bedside. ADL Comments: Pt completed bed mobility with mod v/c and c/o Lt UE pain severe. pt sitting eob unsupported supervision. pt and family provided handout for cervical and educated on positioning and wear schedule. Pt positioned in chair with  pillows to decrease pain.      OT Diagnosis: Acute pain;Generalized weakness  OT Problem List: Decreased strength;Decreased range of motion;Decreased activity tolerance;Impaired balance (sitting and/or standing);Decreased safety awareness;Decreased knowledge of use of DME or AE;Decreased knowledge of precautions;Pain;Impaired UE functional use OT Treatment Interventions: Self-care/ADL training;Therapeutic exercise;Neuromuscular education;DME and/or AE instruction;Therapeutic activities;Patient/family education;Balance training;Other (comment) (aspen collar education)   OT Goals Acute Rehab OT Goals OT Goal Formulation: With patient/family Time For Goal Achievement: 09/26/11 Potential to Achieve Goals: Good ADL Goals Pt Will Perform Eating: with modified independence;Sitting, chair;Supported;with adaptive utensils ADL Goal: Eating - Progress: Goal set today Pt Will Perform Grooming: with modified independence;Sitting, chair;Supported;with adaptive equipment ADL Goal: Grooming - Progress: Goal set today Pt Will Perform Upper Body Bathing: with set-up;Sitting, chair;Supported ADL Goal: Upper Body Bathing - Progress: Goal set today Pt Will Perform Upper Body Dressing: with set-up;Sitting, chair;Supported ADL Goal: Location manager Dressing - Progress: Goal set today Miscellaneous OT Goals Miscellaneous OT Goal #1: Pt and family will don / doff brace for bathing/dressing  independently. OT Goal: Miscellaneous Goal #1 - Progress: Goal set today Miscellaneous OT Goal #2: Pt will complete BIL UE AROM shoulder flexion/ extension , shoulder abduction/ adduction, shoulder circumduction (per Dr Venetia Maxon request) OT Goal: Miscellaneous Goal #2 - Progress: Goal set today  Visit Information  Last OT Received On: 09/12/11 Assistance Needed: +1 PT/OT Co-Evaluation/Treatment: Yes    Subjective Data  Subjective: "i want to use the regular toilet and take this foley out" Patient Stated Goal: to return home     Prior Functioning  Vision/Perception  Home Living Lives With: Spouse Available Help at Discharge: Family Type of Home: House Home Access: Stairs to enter [employer] of Steps: 2-3 Entrance Stairs-Rails: Right Home  Layout: One level Bathroom Shower/Tub: Counselling psychologist: Yes How Accessible: Accessible via walker Home Adaptive Equipment: None Additional Comments: describes walking as difficult at baseline Prior Function Level of Independence: Independent Able to Take Stairs?: Yes Driving: Yes Vocation: On disability Communication Communication: No difficulties Dominant Hand: Right      Cognition  Overall Cognitive Status: Appears within functional limits for tasks assessed/performed Arousal/Alertness: Awake/alert Orientation Level: Appears intact for tasks assessed Behavior During Session: [facility] for tasks performed    Extremity/Trunk Assessment Right Upper Extremity Assessment RUE ROM/Strength/Tone: Within functional levels (assessed to 90 degrees due to cervical collar) RUE Sensation: WFL - Light Touch RUE Coordination: WFL - gross/fine motor Left Upper Extremity Assessment LUE ROM/Strength/Tone: Deficits LUE ROM/Strength/Tone Deficits: AROM ~45 degrees, severe pain clavical at elbow. decreased grip strength to 4 out 5,  numbness at hand LUE Sensation: Deficits LUE Coordination: Deficits Right Lower Extremity Assessment RLE ROM/Strength/Tone: Within functional levels Left Lower Extremity Assessment LLE ROM/Strength/Tone: Within functional levels   Mobility Bed Mobility Bed Mobility: Rolling Right;Right Sidelying to Sit;Sitting - Scoot to Edge of Bed Supine to Sit: 3: Mod assist;With rails Transfers Transfers: Sit to Stand;Stand to Sit Sit to Stand: 4: Min guard;With upper extremity assist;From bed Stand to Sit: 4: Min guard;With upper extremity assist;To chair/3-in-1 Details for Transfer Assistance:  (A) due to lines and leads           End of Session OT - End of Session Activity Tolerance: Patient tolerated treatment well Patient left: in chair;with call bell/phone within reach Nurse Communication: Mobility status  GO     Harrel Carina [facility] 09/12/2011, 11:25 AM Pager: [phone]

## 2011-09-12 NOTE — Evaluation (Signed)
Physical Therapy Evaluation Patient Details Name: [name] MRN: [id number] DOB: 07-Jun-1970 Today's Date: 09/12/2011 Time: 0865-7846 PT Time Calculation (min): 29 min  PT Assessment / Plan / Recommendation Clinical Impression  pt s/pC2-5 with mobility limited by pain and decr UE uncoordination.  All appropriate education initiated with pt and family with teach back method.  Pt may need HHPT for home safety and treat.    PT Assessment  Patient needs continued PT services    Follow Up Recommendations  Home health PT;Supervision for mobility/OOB    Barriers to Discharge        Equipment Recommendations  Defer to next venue    Recommendations for Other Services     Frequency Min 5X/week    Precautions / Restrictions Precautions Precautions: Cervical Restrictions Weight Bearing Restrictions: No   Pertinent Vitals/Pain       Mobility  Bed Mobility Bed Mobility: Rolling Right;Right Sidelying to Sit;Sitting - Scoot to Edge of Bed Rolling Right: 4: Min assist;With rail Right Sidelying to Sit: 3: Mod assist;HOB elevated;With rails Supine to Sit: 3: Mod assist;With rails Sitting - Scoot to Edge of Bed: 4: Min guard Details for Bed Mobility Assistance: vc's for safety/technique Transfers Transfers: Sit to Stand;Stand to Sit Sit to Stand: 4: Min guard;With upper extremity assist;From bed Stand to Sit: 4: Min guard;With upper extremity assist;To chair/3-in-1 Details for Transfer Assistance: Vc's for hand placement; Ambulation/Gait Ambulation/Gait Assistance: 4: Min guard Ambulation Distance (Feet): 30 Feet Assistive device: None Ambulation/Gait Assistance Details: generally steady gait, Wide BOS Gait Pattern: Step-through pattern;Decreased step length - right;Decreased step length - left;Decreased stride length;Wide base of support Stairs: No    Exercises     PT Diagnosis: Difficulty walking;Acute pain  PT Problem List: Decreased activity tolerance;Decreased  mobility;Decreased knowledge of use of DME;Decreased safety awareness;Decreased knowledge of precautions;Pain PT Treatment Interventions: Gait training;Stair training;Functional mobility training;Therapeutic activities;Patient/family education   PT Goals Acute Rehab PT Goals PT Goal Formulation: With patient Time For Goal Achievement: 09/26/11 Potential to Achieve Goals: Good Pt will go Supine/Side to Sit: with modified independence PT Goal: Supine/Side to Sit - Progress: Goal set today Pt will Transfer Bed to Chair/Chair to Bed: with modified independence PT Transfer Goal: Bed to Chair/Chair to Bed - Progress: Goal set today Pt will Ambulate: >150 feet;with modified independence;with least restrictive assistive device PT Goal: Ambulate - Progress: Goal set today Pt will Go Up / Down Stairs: 3-5 stairs;with modified independence;with rail(s) PT Goal: Up/Down Stairs - Progress: Goal set today  Visit Information  Last PT Received On: 09/12/11 Assistance Needed: +1 PT/OT Co-Evaluation/Treatment: Yes    Subjective Data  Subjective: My shoulders are just sore Patient Stated Goal: Independent   Prior Functioning  Home Living Lives With: Spouse Available Help at Discharge: Family Type of Home: House Home Access: Stairs to enter Secretary/administrator of Steps: 2-3 Entrance Stairs-Rails: Right Home Layout: One level Bathroom Shower/Tub: Forensic scientist: Standard Bathroom Accessibility: Yes How Accessible: Accessible via walker Home Adaptive Equipment: None Additional Comments: describes walking as difficult at baseline Prior Function Level of Independence: Independent Able to Take Stairs?: Yes Driving: Yes Vocation: On disability Communication Communication: No difficulties Dominant Hand: Right    Cognition  Overall Cognitive Status: Appears within functional limits for tasks assessed/performed Arousal/Alertness: Awake/alert Orientation Level:  Appears intact for tasks assessed Behavior During Session: [facility] for tasks performed    Extremity/Trunk Assessment Right Upper Extremity Assessment RUE ROM/Strength/Tone: Within functional levels (assessed to 90 degrees due to cervical collar)  RUE Sensation: WFL - Light Touch RUE Coordination: WFL - gross/fine motor Left Upper Extremity Assessment LUE ROM/Strength/Tone: Deficits LUE ROM/Strength/Tone Deficits: AROM ~45 degrees, severe pain clavical at elbow. decreased grip strength to 4 out 5,  numbness at hand LUE Sensation: Deficits LUE Coordination: Deficits Right Lower Extremity Assessment RLE ROM/Strength/Tone: Within functional levels Left Lower Extremity Assessment LLE ROM/Strength/Tone: Within functional levels   Balance Balance Balance Assessed: No  End of Session PT - End of Session Equipment Utilized During Treatment: Cervical collar Activity Tolerance: Patient tolerated treatment well Patient left: in chair;with call bell/phone within reach;with family/visitor present Nurse Communication: Mobility status  GP     Tawsha Terrero, Eliseo Gum 09/12/2011, 11:34 AM  09/12/2011  Creighton Bing, PT [phone] (pager)

## 2011-09-12 NOTE — Progress Notes (Signed)
Clinical Social Worker received referral for new snf placement. Clinical Social Worker reviewed chart and noted PT/OT recommendation for Home Health PT/OT. Inappropriate Clinical Social Worker referral. Clinical Social Worker notified RNCM of recommendations for home health. No further social work needs identified at this time. Clinical Social Worker signing off.   Jacklynn Lewis, MSW, LCSWA  Clinical Social Work [phone]

## 2011-09-12 NOTE — Progress Notes (Signed)
As above.

## 2011-09-12 NOTE — Progress Notes (Signed)
Subjective: Patient reports "My neck hurts."  Objective: Vital signs in last 24 hours: Temp:  [97.5 F (36.4 C)-98.5 F (36.9 C)] 98.2 F (36.8 C) (08/14 0700) Pulse Rate:  [75-99] 94  (08/14 0700) Resp:  [11-28] 21  (08/14 0700) BP: (97-199)/(66-107) 97/81 mmHg (08/14 0700) SpO2:  [91 %-99 %] 91 % (08/14 0700) Weight:  [177.5 kg (391 lb 5.1 oz)] 177.5 kg (391 lb 5.1 oz) (08/13 1745)  Intake/Output from previous day: 08/13 0701 - 08/14 0700 In: 4013 [P.O.:118; I.V.:3540; IV Piggyback:100] Out: 1310 [Urine:1210; Blood:100] Intake/Output this shift:    Alert, conversant, husband at bedside. Hemovac patent ( ). MAEW. Decreased ROM LUE as pre-op - reassured.   Lab Results: No results found for this basename: WBC:2,HGB:2,HCT:2,PLT:2 in the last 72 hours BMET No results found for this basename: NA:2,K:2,CL:2,CO2:2,GLUCOSE:2,BUN:2,CREATININE:2,CALCIUM:2 in the last 72 hours  Studies/Results: Dg Cervical Spine 1 View  09/11/2011  *RADIOLOGY REPORT*  Clinical Data: Neck pain.  Posterior cervical C3-4 fusion and decompression.  DG C-ARM 1-60 MIN,DG CERVICAL SPINE - 1 VIEW  Comparison: None.  Findings: There is a single lateral C-arm films presented for interpretation.  A slightly angled probe was directed most closely toward the arch of C1 posteriorly.  Due to body habitus, visualization of the C3-C4 area was suboptimal.  IMPRESSION: As above.  Original Report Authenticated By: Elsie Stain, M.D.   Dg C-arm 1-60 Min  09/11/2011  *RADIOLOGY REPORT*  Clinical Data: Neck pain.  Posterior cervical C3-4 fusion and decompression.  DG C-ARM 1-60 MIN,DG CERVICAL SPINE - 1 VIEW  Comparison: None.  Findings: There is a single lateral C-arm films presented for interpretation.  A slightly angled probe was directed most closely toward the arch of C1 posteriorly.  Due to body habitus, visualization of the C3-C4 area was suboptimal.  IMPRESSION: As above.  Original Report Authenticated By: Elsie Stain, M.D.    Assessment/Plan: Improving  LOS: 1 day  Mobilize; d/c foley when OOB. Collar can be removed for showers/reposition for comfort but should be worn at all other times.    Georgiann Cocker 09/12/2011, 8:39 AM

## 2011-09-13 ENCOUNTER — Encounter (HOSPITAL_COMMUNITY): Payer: Self-pay | Admitting: Neurosurgery

## 2011-09-13 MED ORDER — DIAZEPAM 5 MG PO TABS: 5.0000 mg | ORAL_TABLET | Freq: Four times a day (QID) | ORAL | Status: AC | PRN

## 2011-09-13 MED ORDER — HYDROMORPHONE HCL 2 MG PO TABS: 4.0000 mg | ORAL_TABLET | ORAL | Status: AC | PRN

## 2011-09-13 NOTE — Progress Notes (Signed)
Subjective: Patient reports doing well  Objective: Vital signs in last 24 hours: Temp:  [98.1 F (36.7 C)-98.6 F (37 C)] 98.3 F (36.8 C) (08/15 1610) Pulse Rate:  [74-94] 85  (08/15 0613) Resp:  [16-23] 18  (08/15 0613) BP: (160-186)/(71-98) 170/74 mmHg (08/15 0613) SpO2:  [93 %-99 %] 98 % (08/15 0613)  Intake/Output from previous day: 08/14 0701 - 08/15 0700 In: 303 [I.V.:303] Out: 372 [Urine:302; Drains:70] Intake/Output this shift:    Physical Exam: Improved mobility of left shoulder, denies numbness in upper extremities, good strength, dressing CDI  Lab Results: No results found for this basename: WBC:2,HGB:2,HCT:2,PLT:2 in the last 72 hours BMET No results found for this basename: NA:2,K:2,CL:2,CO2:2,GLUCOSE:2,BUN:2,CREATININE:2,CALCIUM:2 in the last 72 hours  Studies/Results: Dg Cervical Spine 1 View  09/11/2011  *RADIOLOGY REPORT*  Clinical Data: Neck pain.  Posterior cervical C3-4 fusion and decompression.  DG C-ARM 1-60 MIN,DG CERVICAL SPINE - 1 VIEW  Comparison: None.  Findings: There is a single lateral C-arm films presented for interpretation.  A slightly angled probe was directed most closely toward the arch of C1 posteriorly.  Due to body habitus, visualization of the C3-C4 area was suboptimal.  IMPRESSION: As above.  Original Report Authenticated By: Elsie Stain, M.D.   Dg C-arm 1-60 Min  09/11/2011  *RADIOLOGY REPORT*  Clinical Data: Neck pain.  Posterior cervical C3-4 fusion and decompression.  DG C-ARM 1-60 MIN,DG CERVICAL SPINE - 1 VIEW  Comparison: None.  Findings: There is a single lateral C-arm films presented for interpretation.  A slightly angled probe was directed most closely toward the arch of C1 posteriorly.  Due to body habitus, visualization of the C3-C4 area was suboptimal.  IMPRESSION: As above.  Original Report Authenticated By: Elsie Stain, M.D.    Assessment/Plan: Doing well.  D/C home.  Follow up in office 3 weeks postop.    LOS: 2  days    Dorian Heckle, MD 09/13/2011, 8:56 AM

## 2011-09-13 NOTE — Discharge Summary (Signed)
Physician Discharge Summary  Patient ID: [name] MRN: [id number] DOB/AGE: 1970-03-03 41 y.o.  Admit date: 09/11/2011 Discharge date: 09/13/2011  Admission Diagnoses:  Discharge Diagnoses:  Active Problems:  * No active hospital problems. *    Discharged Condition: good  Hospital Course: Uncomplicated recovery following Posterior cervical decompression and fusion at C2-5 with OPLL and cervical stenosis (4mm canal at C3/4 level).  Consults: None  Significant Diagnostic Studies: None  Treatments: surgery: Posterior cervical decompression and fusion at C2-5  Discharge Exam: Blood pressure 170/74, pulse 85, temperature 98.3 F (36.8 C), temperature source Oral, resp. rate 18, height 5\' 5"  (1.651 m), weight 177.5 kg (391 lb 5.1 oz), last menstrual period 09/11/2002, SpO2 98.00%. Neurologic: Alert and oriented X 3, normal strength and tone. Normal symmetric reflexes. Normal coordination and gait Wound:Dressing CDI  Disposition: Home  Discharge Orders    Future Appointments: Provider: Department: Dept Phone: Center:   10/23/2011 3:15 PM [name], MD [facility] [phone] [facility]   12/06/2011 4:30 PM Julio Sicks, NP [facility] [phone] None     Medication List  As of 09/13/2011 10:21 AM   STOP taking these medications         diclofenac sodium 1 % Gel      GLUCOSAMINE-CHONDROITIN PO         TAKE these medications         albuterol 108 (90 BASE) MCG/ACT inhaler   Commonly known as: PROVENTIL HFA;VENTOLIN HFA   Inhale 2 puffs into the lungs every 4 (four) hours as needed.      ALPRAZolam 1 MG tablet   Commonly known as: XANAX   Take 1 mg by mouth 3 (three) times daily.      cetirizine 10 MG tablet   Commonly known as: ZYRTEC   Take 10 mg by mouth every morning.      chlorpheniramine 4 MG tablet   Commonly known as: CHLOR-TRIMETON   Take 4 mg by mouth at bedtime.      clonazePAM 1 MG tablet   Commonly known as:  KLONOPIN   Take 1 mg by mouth 3 (three) times daily.      diazepam 5 MG tablet   Commonly known as: VALIUM   Take 1 tablet (5 mg total) by mouth every 6 (six) hours as needed.      DULoxetine 60 MG capsule   Commonly known as: CYMBALTA   Take 120 mg by mouth daily.      furosemide 40 MG tablet   Commonly known as: LASIX   TAKE 2 TABLETS BY MOUTH IN AM AND 1 TABLET AT NIGHT      HYDROmorphone 2 MG tablet   Commonly known as: DILAUDID   Take 2 tablets (4 mg total) by mouth every 4 (four) hours as needed for pain.      lamoTRIgine 200 MG tablet   Commonly known as: LAMICTAL   Take 200 mg by mouth 2 (two) times daily.      lidocaine 5 %   Commonly known as: LIDODERM   Place 1 patch onto the skin daily. Remove & Discard patch within 12 hours      losartan 25 MG tablet   Commonly known as: COZAAR   Take 1 tablet (25 mg total) by mouth 2 (two) times daily.      LYRICA 300 MG capsule   Generic drug: pregabalin   Take 1 tablet by mouth Twice daily.      methylphenidate 10 MG  tablet   Commonly known as: RITALIN   5-20 mg. Takes 20 mg morning and noon, and 5-10 mg at 4pm      MICROGESTIN FE 1.5/30 1.5-30 MG-MCG tablet   Generic drug: norethindrone-ethinyl estradiol-iron   Take 1 tablet by mouth at bedtime. Takes continuously      mometasone 50 MCG/ACT nasal spray   Commonly known as: NASONEX   Place 2 sprays into the nose daily.      multivitamin tablet   Take 1 tablet by mouth daily.      potassium chloride SA 20 MEQ tablet   Commonly known as: K-DUR,KLOR-CON   Take 20 mEq by mouth daily.      KLOR-CON M20 20 MEQ tablet   Generic drug: potassium chloride SA   TAKE 1 TABLET BY MOUTH DAILY.      traMADol 50 MG tablet   Commonly known as: ULTRAM   Take 2 tablets by mouth 4 times daily as needed. For pain      vitamin C 500 MG tablet   Commonly known as: ASCORBIC ACID   Take 500 mg by mouth daily.      Zinc 50 MG Caps   Take 50 mg by mouth daily.              Signed: Dorian Heckle, MD 09/13/2011, 10:21 AM

## 2011-09-13 NOTE — Progress Notes (Signed)
Patient education and discharge instructions explained to patient and family. All questions answered to patient's satisfaction. Pt D/C home with no signs of acute distress.

## 2011-09-13 NOTE — Progress Notes (Signed)
Occupational Therapy Treatment Patient Details Name: [name] MRN: [id number] DOB: Feb 23, 1970 Today's Date: 09/13/2011 Time: 1033-1100 OT Time Calculation (min): 27 min  OT Assessment / Plan / Recommendation Comments on Treatment Session Pt progressing with therapy.     Follow Up Recommendations  Home health OT    Barriers to Discharge       Equipment Recommendations  Defer to next venue    Recommendations for Other Services    Frequency Min 2X/week   Plan Discharge plan remains appropriate    Precautions / Restrictions Precautions Precautions: Cervical Restrictions Weight Bearing Restrictions: No   Pertinent Vitals/Pain Pt reports neck pain- did not rate. RN provided pain medication    ADL  Equipment Used:  (cervical collar) ADL Comments: Educated pt and mother on brace donning/doffing brace as well as cleaning of pads. Attempted to contact bio tech to order an extra set of pads- unable to reach anyone at this time. Educated pt on brace wearing schedule (per MD only off for showering)    OT Diagnosis:    OT Problem List:   OT Treatment Interventions:     OT Goals Miscellaneous OT Goals OT Goal: Miscellaneous Goal #1 - Progress: Progressing toward goals  Visit Information  Last OT Received On: 09/13/11 Assistance Needed: +1    Subjective Data      Prior Functioning       Cognition  Overall Cognitive Status: Appears within functional limits for tasks assessed/performed Arousal/Alertness: Awake/alert Orientation Level: Appears intact for tasks assessed Behavior During Session: [facility] for tasks performed    Mobility Bed Mobility Bed Mobility: Not assessed Transfers Sit to Stand: 6: Modified independent (Device/Increase time);From chair/3-in-1 Stand to Sit: 6: Modified independent (Device/Increase time);To chair/3-in-1   Exercises    Balance    End of Session OT - End of Session Equipment Utilized During Treatment: Gait belt;Cervical  collar Activity Tolerance: Patient tolerated treatment well Patient left:  (ambulating with PT) Nurse Communication: Mobility status  GO     Severiano Utsey 09/13/2011, 1:18 PM

## 2011-09-13 NOTE — Progress Notes (Signed)
Physical Therapy Treatment Patient Details Name: [name] MRN: [id number] DOB: 1970/03/25 Today's Date: 09/13/2011 Time: 4098-1191 PT Time Calculation (min): 24 min  PT Assessment / Plan / Recommendation Comments on Treatment Session  Patient educated on cervical precautions and brace wear (brace wear by OT). Patient able to get OOB without assistance per her report. Patient states that she feels safe to go home with assistance availible.     Follow Up Recommendations  Home health PT;Supervision for mobility/OOB    Barriers to Discharge        Equipment Recommendations  Defer to next venue    Recommendations for Other Services    Frequency Min 5X/week   Plan Discharge plan remains appropriate;Frequency remains appropriate    Precautions / Restrictions Precautions Precautions: Cervical   Pertinent Vitals/Pain 6/10 neck pain. RN made aware    Mobility  Bed Mobility Bed Mobility: Not assessed Transfers Sit to Stand: 6: Modified independent (Device/Increase time) Stand to Sit: 6: Modified independent (Device/Increase time) Ambulation/Gait Ambulation/Gait Assistance: 5: Supervision Ambulation Distance (Feet): 200 Feet Assistive device: None Ambulation/Gait Assistance Details: Supervision for safety. Patient slightly guarded with ambulation Gait Pattern: Wide base of support;Decreased stride length;Step-through pattern Stairs: Yes Stairs Assistance: 4: Min assist Stairs Assistance Details (indicate cue type and reason): A for balance and safety. Cues for technique Stair Management Technique: One rail Right Number of Stairs: 2     Exercises     PT Diagnosis:    PT Problem List:   PT Treatment Interventions:     PT Goals Acute Rehab PT Goals PT Transfer Goal: Bed to Chair/Chair to Bed - Progress: Progressing toward goal PT Goal: Ambulate - Progress: Progressing toward goal PT Goal: Up/Down Stairs - Progress: Progressing toward goal  Visit Information  Last  PT Received On: 09/13/11 Assistance Needed: +1    Subjective Data      Cognition  Overall Cognitive Status: Appears within functional limits for tasks assessed/performed Arousal/Alertness: Awake/alert Orientation Level: Appears intact for tasks assessed Behavior During Session: [facility] for tasks performed    Balance     End of Session PT - End of Session Equipment Utilized During Treatment: Cervical collar Activity Tolerance: Patient tolerated treatment well Patient left: in chair;with call bell/phone within reach;with family/visitor present Nurse Communication: Mobility status   GP     Fredrich Birks 09/13/2011, 11:39 AM 09/13/2011 Fredrich Birks PTA [phone] pager [phone] office

## 2011-09-14 NOTE — Care Management Note (Signed)
    Page 1 of 1   09/14/2011     2:06:39 PM   CARE MANAGEMENT NOTE 09/14/2011  Patient:  [name], [name]   Account Number:  [id number]  Date Initiated:  09/12/2011  Documentation initiated by:  Jacquelynn Cree  Subjective/Objective Assessment:   Admitted postop posterior cerv decompression, C2-5 fusion     Action/Plan:   PT eval-recommending HHPT  OT eval recommending HHOT   Anticipated DC Date:  09/15/2011   Anticipated DC Plan:  HOME W HOME HEALTH SERVICES      DC Planning Services  CM consult      Choice offered to / List presented to:  C-1 Patient        HH arranged  HH-2 PT  HH-3 OT      [facility] agency  [facility]   Status of service:  Completed, signed off Medicare Important Message given?   (If response is "NO", the following Medicare IM given date fields will be blank) Date Medicare IM given:   Date Additional Medicare IM given:    Discharge Disposition:  HOME W HOME HEALTH SERVICES  Per UR Regulation:  Reviewed for med. necessity/level of care/duration of stay  If discussed at Long Length of Stay Meetings, dates discussed:    Comments:  09/14/11 Onnie Boer, RN, BSN 1405 PT WAS DC'D TO HOME WITH [facility] PT/OT WITH [facility].

## 2011-09-18 ENCOUNTER — Ambulatory Visit: Payer: Self-pay | Admitting: Psychology

## 2011-09-25 ENCOUNTER — Ambulatory Visit: Payer: Self-pay | Admitting: Psychology

## 2011-10-02 ENCOUNTER — Ambulatory Visit: Payer: Self-pay | Admitting: Psychology

## 2011-10-07 ENCOUNTER — Other Ambulatory Visit: Payer: Self-pay | Admitting: Pulmonary Disease

## 2011-10-09 ENCOUNTER — Ambulatory Visit (INDEPENDENT_AMBULATORY_CARE_PROVIDER_SITE_OTHER): Payer: PRIVATE HEALTH INSURANCE | Admitting: Psychology

## 2011-10-09 DIAGNOSIS — F319 Bipolar disorder, unspecified: Secondary | ICD-10-CM

## 2011-10-16 ENCOUNTER — Ambulatory Visit (INDEPENDENT_AMBULATORY_CARE_PROVIDER_SITE_OTHER): Payer: PRIVATE HEALTH INSURANCE | Admitting: Psychology

## 2011-10-16 DIAGNOSIS — F319 Bipolar disorder, unspecified: Secondary | ICD-10-CM | POA: Diagnosis not present

## 2011-10-17 ENCOUNTER — Other Ambulatory Visit: Payer: Self-pay | Admitting: Cardiology

## 2011-10-18 NOTE — Telephone Encounter (Signed)
..   Requested Prescriptions   Pending Prescriptions Disp Refills  . losartan (COZAAR) 25 MG tablet [Pharmacy Med Name: LOSARTAN POTASSIUM 25 MG TAB] 60 tablet 6    Sig: TAKE 1 TABLET BY MOUTH TWICE A DAY  Patient needs to contact office to schedule  Appointment  For 6 months follow up.Ph:[phone]. Thank you.

## 2011-10-23 ENCOUNTER — Ambulatory Visit (INDEPENDENT_AMBULATORY_CARE_PROVIDER_SITE_OTHER): Payer: PRIVATE HEALTH INSURANCE | Admitting: Cardiology

## 2011-10-23 ENCOUNTER — Encounter: Payer: Self-pay | Admitting: Cardiology

## 2011-10-23 ENCOUNTER — Ambulatory Visit (INDEPENDENT_AMBULATORY_CARE_PROVIDER_SITE_OTHER): Payer: PRIVATE HEALTH INSURANCE | Admitting: Psychology

## 2011-10-23 VITALS — BP 115/70 | HR 11 | Ht 65.0 in | Wt 357.8 lb

## 2011-10-23 DIAGNOSIS — I429 Cardiomyopathy, unspecified: Secondary | ICD-10-CM

## 2011-10-23 DIAGNOSIS — I428 Other cardiomyopathies: Secondary | ICD-10-CM

## 2011-10-23 DIAGNOSIS — I5022 Chronic systolic (congestive) heart failure: Secondary | ICD-10-CM

## 2011-10-23 DIAGNOSIS — F319 Bipolar disorder, unspecified: Secondary | ICD-10-CM

## 2011-10-23 NOTE — Progress Notes (Signed)
HPI This patient presents for evaluation of a mildly reduced ejection fraction. Since I last saw her she has done well.  She has cervical neck surgery but actually has done well with this. She's been followed by pulmonary and has been told that she does not have asthma and has been taken off of some of her medications. She's having no acute symptoms. She denies any acute shortness of breath, PND or orthopnea. She has had no palpitations, presyncope or syncope. She has had no chest pressure, neck or arm discomfort.   No Known Allergies  Current Outpatient Prescriptions  Medication Sig Dispense Refill  . ALPRAZolam (XANAX) 1 MG tablet Take 1 mg by mouth 3 (three) times daily.       . Ascorbic Acid (VITAMIN C) 500 MG tablet Take 500 mg by mouth daily.        . cetirizine (ZYRTEC) 10 MG tablet Take 10 mg by mouth every morning.      . chlorpheniramine (CHLOR-TRIMETON) 4 MG tablet Take 4 mg by mouth at bedtime.      . clonazePAM (KLONOPIN) 1 MG tablet Take 1 mg by mouth 3 (three) times daily.       . DULoxetine (CYMBALTA) 60 MG capsule Take 120 mg by mouth daily.       . furosemide (LASIX) 40 MG tablet TAKE 2 TABLETS BY MOUTH IN AM AND 1 TABLET AT NIGHT  90 tablet  0  . HYDROmorphone HCl (DILAUDID PO) Take by mouth daily.      [name] KLOR-CON M20 20 MEQ tablet TAKE 1 TABLET BY MOUTH DAILY.  90 tablet  3  . lamoTRIgine (LAMICTAL) 200 MG tablet Take 200 mg by mouth 2 (two) times daily.       [name] losartan (COZAAR) 25 MG tablet TAKE 1 TABLET BY MOUTH TWICE A DAY  60 tablet  6  . LYRICA 300 MG capsule Take 1 tablet by mouth Twice daily.      . methylphenidate (RITALIN) 10 MG tablet 5-20 mg. Takes 20 mg morning and noon, and 5-10 mg at 4pm      . Multiple Vitamin (MULTIVITAMIN) tablet Take 1 tablet by mouth daily.        . norethindrone-ethinyl estradiol-iron (MICROGESTIN FE 1.5/30) 1.5-30 MG-MCG tablet Take 1 tablet by mouth at bedtime. Takes continuously      . potassium chloride SA (K-DUR,KLOR-CON) 20 MEQ  tablet Take 20 mEq by mouth daily.      . Zinc 50 MG CAPS Take 50 mg by mouth daily.      . traMADol (ULTRAM) 50 MG tablet Take 2 tablets by mouth 4 times daily as needed. For pain        Past Medical History  Diagnosis Date  . Morbid obesity   . Obesity hypoventilation syndrome   . Depression   . Bipolar disorder   . Hypoxemia     history of - no home O2  . Cor pulmonale   . History of pleural effusion   . Obstructive lung disease   . Spinal stenosis in cervical region   . Acute systolic congestive heart failure   . Seasonal allergies     current runny nose  . Arthritis     osteoarthritis bilal. knees  . Urinary incontinence   . Abrasion of breast 06/21/2011    left  . Sleep apnea sleep study 07/02/2010    uses CPAP nightly  . Anxiety     panic attacks  . Shortness of  breath     with ADLs  . Asthma     no per PFT 7/13  . Seizures     febrile seizure x 1 as a child. Several times  . IBS (irritable bowel syndrome)   . Carpal tunnel syndrome of left wrist 05/2011    Being evaluated for MS    Past Surgical History  Procedure Date  . Wisdom tooth extraction   . Mass excision 08/07/2010    right index  . Carpal tunnel release 06/27/2011    Procedure: CARPAL TUNNEL RELEASE;  Surgeon: Nicki Reaper, MD;  Location:  SURGERY CENTER;  Service: Orthopedics;  Laterality: Left;  . Posterior cervical fusion/foraminotomy 09/11/2011    Procedure: POSTERIOR CERVICAL FUSION/FORAMINOTOMY LEVEL 1;  Surgeon: Maeola Harman, MD;  Location: [facility];  Service: Neurosurgery;  Laterality: N/A;  Cervical Three-Four Posteior Cervical Fusion and Decompression.    ROS:  As stated in the HPI and negative for all other systems.  PHYSICAL EXAM BP 115/70  Pulse 11  Ht 5\' 5"  (1.651 m)  Wt 357 lb 12.8 oz (162.297 kg)  BMI 59.54 kg/m2 GENERAL:  Well appearing HEENT:  Pupils equal round and reactive, fundi not visualized, oral mucosa unremarkable NECK: Cervical collar. LUNGS:  Clear to  auscultation bilaterally BACK:  No CVA tenderness CHEST:  Unremarkable HEART:  PMI not displaced or sustained,S1 and S2 within normal limits, no S3, no S4, no clicks, no rubs, no murmurs ABD:  Flat, positive bowel sounds normal in frequency in pitch, no bruits, no rebound, no guarding, no midline pulsatile mass, no hepatomegaly, no splenomegaly, morbidly obese EXT:  2 plus pulses throughout, no edema, no cyanosis no clubbing PSYCH:  Cognitively intact, oriented to person place, tearful  EKG:    Sinus rhythm, rate 111, axis within normal limits, minimal QT prolongation, RSR prime V1 and V2, no acute ST-T wave changes. No change from previous.  10/23/2011  ASSESSMENT AND PLAN  Cardiomyopathy - She has a mild cardiomyopathy and she seems to be euvolemic today. She's tolerating the medications as listed. I will suggest no change to her therapy.  Obesity -  Her weights have been stable.  She would like to start rehab when her neck is healed.    Obesity hypoventilation syndrome -  As above.

## 2011-10-23 NOTE — Patient Instructions (Addendum)
The current medical regimen is effective;  continue present plan and medications.  Follow up in 6 months with Dr [name].  You will receive a letter in the mail 2 months before you are due.  Please call us when you receive this letter to schedule your follow up appointment.  

## 2011-10-30 ENCOUNTER — Ambulatory Visit (INDEPENDENT_AMBULATORY_CARE_PROVIDER_SITE_OTHER): Payer: Medicare Other | Admitting: Psychology

## 2011-10-30 DIAGNOSIS — F319 Bipolar disorder, unspecified: Secondary | ICD-10-CM | POA: Diagnosis not present

## 2011-11-06 ENCOUNTER — Ambulatory Visit (INDEPENDENT_AMBULATORY_CARE_PROVIDER_SITE_OTHER): Payer: Medicare Other | Admitting: Psychology

## 2011-11-06 ENCOUNTER — Telehealth: Payer: Self-pay | Admitting: Cardiology

## 2011-11-06 DIAGNOSIS — F319 Bipolar disorder, unspecified: Secondary | ICD-10-CM

## 2011-11-06 NOTE — Telephone Encounter (Signed)
Pt was here about two weeks ago, forgot to mention her lasix was not working and now it is , wants to know if you know why it would not work then just start working , pls call [phone] ok to leave a message

## 2011-11-07 ENCOUNTER — Other Ambulatory Visit: Payer: Self-pay | Admitting: Pulmonary Disease

## 2011-11-07 NOTE — Telephone Encounter (Signed)
Called and left message for pt   Will call back again tomorrow.

## 2011-11-12 NOTE — Telephone Encounter (Signed)
Left message for pt to call back if further problems with Lasix

## 2011-11-13 ENCOUNTER — Ambulatory Visit (INDEPENDENT_AMBULATORY_CARE_PROVIDER_SITE_OTHER): Payer: Medicare Other | Admitting: Psychology

## 2011-11-13 ENCOUNTER — Ambulatory Visit: Payer: Self-pay | Admitting: Psychology

## 2011-11-13 DIAGNOSIS — F319 Bipolar disorder, unspecified: Secondary | ICD-10-CM | POA: Diagnosis not present

## 2011-11-20 ENCOUNTER — Ambulatory Visit (INDEPENDENT_AMBULATORY_CARE_PROVIDER_SITE_OTHER): Payer: Medicare Other | Admitting: Psychology

## 2011-11-20 DIAGNOSIS — F319 Bipolar disorder, unspecified: Secondary | ICD-10-CM

## 2011-11-27 ENCOUNTER — Ambulatory Visit (INDEPENDENT_AMBULATORY_CARE_PROVIDER_SITE_OTHER): Payer: Medicare Other | Admitting: Psychology

## 2011-11-27 DIAGNOSIS — F319 Bipolar disorder, unspecified: Secondary | ICD-10-CM

## 2011-12-04 ENCOUNTER — Ambulatory Visit (INDEPENDENT_AMBULATORY_CARE_PROVIDER_SITE_OTHER): Payer: PRIVATE HEALTH INSURANCE | Admitting: Psychology

## 2011-12-04 DIAGNOSIS — F319 Bipolar disorder, unspecified: Secondary | ICD-10-CM

## 2011-12-06 ENCOUNTER — Ambulatory Visit: Payer: Self-pay | Admitting: Adult Health

## 2011-12-07 ENCOUNTER — Other Ambulatory Visit: Payer: Self-pay | Admitting: Orthopedic Surgery

## 2011-12-09 ENCOUNTER — Other Ambulatory Visit: Payer: Self-pay | Admitting: Pulmonary Disease

## 2011-12-11 ENCOUNTER — Ambulatory Visit (INDEPENDENT_AMBULATORY_CARE_PROVIDER_SITE_OTHER): Payer: PRIVATE HEALTH INSURANCE | Admitting: Psychology

## 2011-12-11 ENCOUNTER — Ambulatory Visit (INDEPENDENT_AMBULATORY_CARE_PROVIDER_SITE_OTHER): Payer: 59 | Admitting: Adult Health

## 2011-12-11 ENCOUNTER — Encounter: Payer: Self-pay | Admitting: Adult Health

## 2011-12-11 VITALS — BP 128/82 | HR 94 | Temp 98.1°F | Ht 65.0 in | Wt 369.0 lb

## 2011-12-11 DIAGNOSIS — J45909 Unspecified asthma, uncomplicated: Secondary | ICD-10-CM

## 2011-12-11 DIAGNOSIS — G4733 Obstructive sleep apnea (adult) (pediatric): Secondary | ICD-10-CM

## 2011-12-11 DIAGNOSIS — F319 Bipolar disorder, unspecified: Secondary | ICD-10-CM

## 2011-12-11 NOTE — Addendum Note (Signed)
Addended by: Boone Master E on: 12/11/2011 05:34 PM   Modules accepted: Orders

## 2011-12-11 NOTE — Progress Notes (Signed)
Subjective:    Patient ID: [name], female    DOB: 10/09/1970, 41 y.o.   MRN: [id number]  HPI PCP [name]   22 WF never smoker with morbid obesity for FU of obstructive sleep apnea on CPAP 13 cm, obesity hypoventilation  & 'asthma' She also has DM-2 & bipolar [name]  She was admitted 4/12 for worsening dyspnea & wt gain . ABG was 7.40/49/63 on 4 L [place]. Echo showed EF 50% with mild decrease in systolic function. BNP > 500 & CXR showed a rt pleural effusion. CT angio neg for PE  She was diuresed > 50 lbs, discharged on O2 ([facility]) . Judicious use of benzos was advised in v/o hypercarbia.  PSG in '02 showed severe obstructive sleep apnea with AHI 40/h, lowest desatn to 52% correctd by 8 cm H2O . Her wt was 325 lbs then.  CPAP titration study >> required 10-16cm CPAP, higher pressure when supine/ REM sleep.  Download 6/18- 08/13/10 >>Good control of events on CPAP 13 cm - changed to this pressure.     09/05/11  she is having  cervical fusion by Dr. Venetia Maxon next tues - for surgical clearance.  Wt steady at 364 lbs -25 lbs gain in the last yr. COmpliant with CPAP O2 dc'd 10/12 allegra and nasonex added 4/13 for nasal symptoms but c/o increased asthma attacks after addition incl acute OV in 6/13 - changed regimen to Zyrtec +Chlortrimeton  6/13  Previously seen by allergist, recommended to have allergy vaccines . Declined.  CXR 5/13 showed bronchitic changes  +drug screen in chart review +marajuana 2012.  Says previous dx with asthma has been tx in past with advair , theophylline.  Currently on albuterol inhaler As needed -uses most days 1-2 times daily.   PFTs 6/13 reviewed - no obstruction, mild restriction, FVC 78%, DLCO 64%  Tearful during visit, morning is not a good time, her bipolar symptoms ' are worse in am'  12/11/2011 Follow up  3 month follow up - reports breathing is at baseline.  Did well with her neck surgery. Still moving slow and sore .  has carpal tunnel release  planned on 11.20.13 Refuses flu shot.   Wears CPAP At bedtime  "loves it"  No problem with mask.  No chest pain or edema.     Review of Systems  Constitutional:   No  weight loss, night sweats,  Fevers, chills, + fatigue, or  lassitude.  HEENT:   No headaches,  Difficulty swallowing,  Tooth/dental problems, or  Sore throat,                No sneezing, itching, ear ache,  +nasal congestion, post nasal drip,   CV:  No chest pain,  Orthopnea, PND, swelling in lower extremities, anasarca, dizziness, palpitations, syncope.   GI  No heartburn, indigestion, abdominal pain, nausea, vomiting, diarrhea, change in bowel habits, loss of appetite, bloody stools.   Resp:  No coughing up of blood.  No change in color of mucus.  No wheezing.  No chest wall deformity  Skin: no rash or lesions.  GU: no dysuria, change in color of urine, no urgency or frequency.  No flank pain, no hematuria     .         Objective:   Physical Exam   Gen. Pleasant, obese, in no distress, normal affect ENT - no lesions, no post nasal drip, class 2-3 airway Neck: No JVD, no thyromegaly, no carotid bruits Lungs: no use of accessory  muscles, no dullness to percussion, decreased without rales or rhonchi  Cardiovascular: Rhythm regular, heart sounds  normal, no murmurs or gallops, no peripheral edema Abdomen: soft and non-tender, no hepatosplenomegaly, BS normal. Musculoskeletal: No deformities, no cyanosis or clubbing Neuro:  alert, non focal, no tremors       Assessment & Plan:

## 2011-12-11 NOTE — Patient Instructions (Addendum)
Continue on CPAP At bedtime   Saline nasal rinses As needed   follow up Dr. Vassie Loll  In 6 months and As needed

## 2011-12-11 NOTE — Assessment & Plan Note (Signed)
Compensated on CPAP Auto titration/Download request from DME.  Weight loss encouraged .

## 2011-12-11 NOTE — Assessment & Plan Note (Signed)
No flare , at baseline

## 2011-12-13 NOTE — Addendum Note (Signed)
Addended by: Boone Master E on: 12/13/2011 04:08 PM   Modules accepted: Orders

## 2011-12-17 ENCOUNTER — Other Ambulatory Visit: Payer: Self-pay

## 2011-12-17 ENCOUNTER — Telehealth: Payer: Self-pay | Admitting: Pulmonary Disease

## 2011-12-17 NOTE — Telephone Encounter (Signed)
I spoke with pt and she was very upset. She stated she received a call from [facility] advising her they received an order to change her cpap to auto setting. She was very surprised by this bc per pt this was not communicated to her at her OV w/ TP. [place] and [place] do this and this will have to wait until December to be done. She is going to have hand surgery on 12/19/11 and does not understand why her "cpap would be played with" knowing she has to recover. Pt stated " this is not proper patient care" and "finds this a very big mistake". Per pt she will not see TP again and will only see RA from this point on. She will not see someone she "distrusts". Pt is wanting to know 3 things: 1) why this was not communicated to her while she was her so she wouldn't have surprised by this? 2) is it okay for her to wait until December to have this done? 3) why was this ordered if TP knew she was going to have hand surgery on Wed 12/19/11 and would need to recover from surgery and rest?  Per JJ send message to her.

## 2011-12-17 NOTE — Progress Notes (Signed)
To come in for bmet-uses cpap-will bring and does use it

## 2011-12-17 NOTE — Telephone Encounter (Signed)
error 

## 2011-12-17 NOTE — Telephone Encounter (Signed)
(  continued) contacted by Christoper Allegra for any reason.  Pt feels there was a large lack of communication from our office & the pt.  Pt states she is having surgery on her hand & will now need to worry if she will do ok w/ her recovery & sleep.  Pt will not be able to use her right hand after surgery.  Pt is very upset & would like to speak w/ someone regarding the lack of communication between her & either TP or RA-whomever ordered this w/ [facility].  Pt feels she was not given any type of input w/ her care.  [name]

## 2011-12-17 NOTE — Telephone Encounter (Signed)
[name], spoke with [facility] and gave verbal order for CPAP download and compliance report (per [name] does not have a specific duty w/ CPAP).  [name] that the previous order was incorrect.  Per [place] pt may bring her card to the office or she may mail it to: [address] [facility] [place] [phone].  [name] that if patient should decide to bring her card personally to the facility, that it will be sometime in December as she will be recovering from surgery.  [name] verbalized her understanding.  Called spoke with patient, informed her of the above.  Pt stated that she will mail the card and was already familiar with [facility]'s mailing address.  Apologized again to patient for the frustration and inconvenience.  Pt to call if anything further is needed.  Will notify patient once DL/compliance report is available.  Nothing further needed.

## 2011-12-17 NOTE — Telephone Encounter (Signed)
This was mistake, thought the order would be more for DL and compliance.  TP spoke with CY - will order download w/ compliance report.  Called spoke with patient, apologized for the misunderstanding/miscommunication with placing the incorrect order.  Advised pt that we were NOT trying to change her CPAP pressure, we simply wanted to check her pressure setting and compliance.  Pt okay with this and no longer upset.  Pt apologized for getting so upset.  Will call [employer] Allegra (pt stated she spoke with Glade Stanford) to see if that company can download the machine for her or if she will have to take the chip to them > pt lives in [place] and is due for surgery at the end of the week.  Advised pt will call [employer] to check this for her and call her back.  Will forward to my inbasket to hold until I am able to contact [employer].

## 2011-12-18 ENCOUNTER — Encounter (HOSPITAL_BASED_OUTPATIENT_CLINIC_OR_DEPARTMENT_OTHER)
Admission: RE | Admit: 2011-12-18 | Discharge: 2011-12-18 | Disposition: A | Discharge status: Home / Self Care | Payer: 59 | Source: Ambulatory Visit | Attending: Orthopedic Surgery | Admitting: Orthopedic Surgery

## 2011-12-18 ENCOUNTER — Encounter (HOSPITAL_BASED_OUTPATIENT_CLINIC_OR_DEPARTMENT_OTHER): Payer: Self-pay | Admitting: *Deleted

## 2011-12-18 ENCOUNTER — Ambulatory Visit (INDEPENDENT_AMBULATORY_CARE_PROVIDER_SITE_OTHER): Payer: PRIVATE HEALTH INSURANCE | Admitting: Psychology

## 2011-12-18 DIAGNOSIS — F319 Bipolar disorder, unspecified: Secondary | ICD-10-CM

## 2011-12-18 LAB — BASIC METABOLIC PANEL
BUN: 8 mg/dL (ref 6–23)
Chloride: 99 mEq/L (ref 96–112)
GFR calc Af Amer: >90 mL/min (ref >90)
GFR calc non Af Amer: >90 mL/min (ref >90)
Potassium: 3.9 mEq/L (ref 3.5–5.1)
Sodium: 140 mEq/L (ref 135–145)

## 2011-12-18 NOTE — Progress Notes (Signed)
Dr crews oked wt

## 2011-12-19 ENCOUNTER — Ambulatory Visit (HOSPITAL_BASED_OUTPATIENT_CLINIC_OR_DEPARTMENT_OTHER)
Admission: RE | Admit: 2011-12-19 | Discharge: 2011-12-19 | Disposition: A | Discharge status: Home / Self Care | Payer: 59 | Source: Ambulatory Visit | Attending: Orthopedic Surgery | Admitting: Orthopedic Surgery

## 2011-12-19 ENCOUNTER — Encounter (HOSPITAL_BASED_OUTPATIENT_CLINIC_OR_DEPARTMENT_OTHER): Payer: Self-pay | Admitting: Orthopedic Surgery

## 2011-12-19 ENCOUNTER — Encounter (HOSPITAL_BASED_OUTPATIENT_CLINIC_OR_DEPARTMENT_OTHER): Payer: Self-pay | Admitting: Anesthesiology

## 2011-12-19 ENCOUNTER — Encounter (HOSPITAL_BASED_OUTPATIENT_CLINIC_OR_DEPARTMENT_OTHER): Payer: Self-pay | Admitting: *Deleted

## 2011-12-19 ENCOUNTER — Ambulatory Visit (HOSPITAL_BASED_OUTPATIENT_CLINIC_OR_DEPARTMENT_OTHER): Payer: 59 | Admitting: Anesthesiology

## 2011-12-19 ENCOUNTER — Encounter (HOSPITAL_BASED_OUTPATIENT_CLINIC_OR_DEPARTMENT_OTHER)
Admission: RE | Disposition: A | Discharge status: Home / Self Care | Payer: Self-pay | Source: Ambulatory Visit | Attending: Orthopedic Surgery

## 2011-12-19 DIAGNOSIS — Z79899 Other long term (current) drug therapy: Secondary | ICD-10-CM | POA: Insufficient documentation

## 2011-12-19 DIAGNOSIS — M65849 Other synovitis and tenosynovitis, unspecified hand: Secondary | ICD-10-CM | POA: Insufficient documentation

## 2011-12-19 DIAGNOSIS — M65839 Other synovitis and tenosynovitis, unspecified forearm: Secondary | ICD-10-CM | POA: Insufficient documentation

## 2011-12-19 DIAGNOSIS — Z01812 Encounter for preprocedural laboratory examination: Secondary | ICD-10-CM | POA: Insufficient documentation

## 2011-12-19 DIAGNOSIS — J449 Chronic obstructive pulmonary disease, unspecified: Secondary | ICD-10-CM | POA: Insufficient documentation

## 2011-12-19 DIAGNOSIS — J4489 Other specified chronic obstructive pulmonary disease: Secondary | ICD-10-CM | POA: Insufficient documentation

## 2011-12-19 DIAGNOSIS — G5600 Carpal tunnel syndrome, unspecified upper limb: Secondary | ICD-10-CM | POA: Diagnosis not present

## 2011-12-19 DIAGNOSIS — E662 Morbid (severe) obesity with alveolar hypoventilation: Secondary | ICD-10-CM | POA: Insufficient documentation

## 2011-12-19 DIAGNOSIS — R569 Unspecified convulsions: Secondary | ICD-10-CM | POA: Insufficient documentation

## 2011-12-19 DIAGNOSIS — F411 Generalized anxiety disorder: Secondary | ICD-10-CM | POA: Insufficient documentation

## 2011-12-19 DIAGNOSIS — F3289 Other specified depressive episodes: Secondary | ICD-10-CM | POA: Insufficient documentation

## 2011-12-19 DIAGNOSIS — M6580 Other synovitis and tenosynovitis, unspecified site: Secondary | ICD-10-CM | POA: Diagnosis not present

## 2011-12-19 DIAGNOSIS — F329 Major depressive disorder, single episode, unspecified: Secondary | ICD-10-CM | POA: Insufficient documentation

## 2011-12-19 DIAGNOSIS — I509 Heart failure, unspecified: Secondary | ICD-10-CM | POA: Insufficient documentation

## 2011-12-19 HISTORY — PX: CARPAL TUNNEL RELEASE: SHX101

## 2011-12-19 HISTORY — PX: TRIGGER FINGER RELEASE: SHX641

## 2011-12-19 SURGERY — CARPAL TUNNEL RELEASE
Anesthesia: General | Site: Wrist | Laterality: Right | Wound class: Clean

## 2011-12-19 MED ORDER — CHLORHEXIDINE GLUCONATE 4 % EX LIQD: 60.0000 mL | Freq: Once | CUTANEOUS | Status: DC

## 2011-12-19 MED ORDER — MIDAZOLAM HCL 5 MG/5ML IJ SOLN
INTRAMUSCULAR | Status: DC | PRN
  Administered 2011-12-19: 2 mg via INTRAVENOUS

## 2011-12-19 MED ORDER — MEPERIDINE HCL 25 MG/ML IJ SOLN: 6.2500 mg | INTRAMUSCULAR | Status: DC | PRN

## 2011-12-19 MED ORDER — FENTANYL CITRATE 0.05 MG/ML IJ SOLN
INTRAMUSCULAR | Status: DC | PRN
  Administered 2011-12-19 (×2): 50 ug via INTRAVENOUS

## 2011-12-19 MED ORDER — FENTANYL CITRATE 0.05 MG/ML IJ SOLN
50.0000 ug | INTRAMUSCULAR | Status: AC | PRN
  Administered 2011-12-19 (×4): 50 ug via INTRAVENOUS

## 2011-12-19 MED ORDER — CEFAZOLIN SODIUM-DEXTROSE 2-3 GM-% IV SOLR: 2.0000 g | INTRAVENOUS | Status: DC

## 2011-12-19 MED ORDER — MIDAZOLAM HCL 2 MG/2ML IJ SOLN: 0.5000 mg | Freq: Once | INTRAMUSCULAR | Status: DC | PRN

## 2011-12-19 MED ORDER — OXYCODONE HCL 5 MG PO TABS
5.0000 mg | ORAL_TABLET | Freq: Once | ORAL | Status: AC | PRN
  Administered 2011-12-19: 5 mg via ORAL

## 2011-12-19 MED ORDER — CEFAZOLIN SODIUM 10 G IJ SOLR
3.0000 g | INTRAMUSCULAR | Status: AC
  Administered 2011-12-19: 3 g via INTRAVENOUS

## 2011-12-19 MED ORDER — BUPIVACAINE HCL (PF) 0.25 % IJ SOLN
INTRAMUSCULAR | Status: DC | PRN
  Administered 2011-12-19: 10 mL

## 2011-12-19 MED ORDER — 0.9 % SODIUM CHLORIDE (POUR BTL) OPTIME
TOPICAL | Status: DC | PRN
  Administered 2011-12-19: 200 mL

## 2011-12-19 MED ORDER — PROPOFOL 10 MG/ML IV BOLUS
INTRAVENOUS | Status: DC | PRN
  Administered 2011-12-19: 200 mg via INTRAVENOUS
  Administered 2011-12-19: 30 mg via INTRAVENOUS
  Administered 2011-12-19: 40 mg via INTRAVENOUS

## 2011-12-19 MED ORDER — ONDANSETRON HCL 4 MG/2ML IJ SOLN
INTRAMUSCULAR | Status: DC | PRN
  Administered 2011-12-19: 4 mg via INTRAVENOUS

## 2011-12-19 MED ORDER — KETOROLAC TROMETHAMINE 30 MG/ML IJ SOLN
30.0000 mg | Freq: Once | INTRAMUSCULAR | Status: AC
  Administered 2011-12-19: 30 mg via INTRAVENOUS

## 2011-12-19 MED ORDER — FENTANYL CITRATE 0.05 MG/ML IJ SOLN
25.0000 ug | INTRAMUSCULAR | Status: DC | PRN
  Administered 2011-12-19 (×4): 50 ug via INTRAVENOUS

## 2011-12-19 MED ORDER — LIDOCAINE HCL (CARDIAC) 20 MG/ML IV SOLN
INTRAVENOUS | Status: DC | PRN
  Administered 2011-12-19: 70 mg via INTRAVENOUS

## 2011-12-19 MED ORDER — OXYCODONE HCL 5 MG/5ML PO SOLN: 5.0000 mg | Freq: Once | ORAL | Status: AC | PRN

## 2011-12-19 MED ORDER — HYDROCODONE-ACETAMINOPHEN 5-325 MG PO TABS: 1.0000 | ORAL_TABLET | Freq: Four times a day (QID) | ORAL | Status: DC | PRN

## 2011-12-19 MED ORDER — LACTATED RINGERS IV SOLN
INTRAVENOUS | Status: DC
  Administered 2011-12-19 (×2): via INTRAVENOUS

## 2011-12-19 MED ORDER — DEXAMETHASONE SODIUM PHOSPHATE 10 MG/ML IJ SOLN
INTRAMUSCULAR | Status: DC | PRN
  Administered 2011-12-19: 10 mg via INTRAVENOUS

## 2011-12-19 MED ORDER — PROMETHAZINE HCL 25 MG/ML IJ SOLN: 6.2500 mg | INTRAMUSCULAR | Status: DC | PRN

## 2011-12-19 SURGICAL SUPPLY — 40 items
BANDAGE COBAN STERILE 2 (GAUZE/BANDAGES/DRESSINGS) ×3 IMPLANT
BANDAGE GAUZE ELAST BULKY 4 IN (GAUZE/BANDAGES/DRESSINGS) ×3 IMPLANT
BLADE SURG 15 STRL LF DISP TIS (BLADE) ×2 IMPLANT
BLADE SURG 15 STRL SS (BLADE) ×1
BNDG COHESIVE 3X5 TAN STRL LF (GAUZE/BANDAGES/DRESSINGS) ×3 IMPLANT
BNDG ESMARK 4X9 LF (GAUZE/BANDAGES/DRESSINGS) ×3 IMPLANT
CHLORAPREP W/TINT 26ML (MISCELLANEOUS) ×3 IMPLANT
CLOTH BEACON ORANGE TIMEOUT ST (SAFETY) ×3 IMPLANT
CORDS BIPOLAR (ELECTRODE) ×3 IMPLANT
COVER MAYO STAND STRL (DRAPES) ×3 IMPLANT
COVER TABLE BACK 60X90 (DRAPES) ×3 IMPLANT
CUFF TOURNIQUET SINGLE 18IN (TOURNIQUET CUFF) ×3 IMPLANT
DECANTER SPIKE VIAL GLASS SM (MISCELLANEOUS) IMPLANT
DRAPE EXTREMITY T 121X128X90 (DRAPE) ×3 IMPLANT
DRAPE SURG 17X23 STRL (DRAPES) ×3 IMPLANT
DRSG KUZMA FLUFF (GAUZE/BANDAGES/DRESSINGS) ×3 IMPLANT
GAUZE XEROFORM 1X8 LF (GAUZE/BANDAGES/DRESSINGS) ×3 IMPLANT
GLOVE BIO SURGEON STRL SZ 6.5 (GLOVE) ×6 IMPLANT
GLOVE BIOGEL PI IND STRL 8.5 (GLOVE) ×2 IMPLANT
GLOVE BIOGEL PI INDICATOR 8.5 (GLOVE) ×1
GLOVE INDICATOR 7.0 STRL GRN (GLOVE) ×6 IMPLANT
GLOVE SURG ORTHO 8.0 STRL STRW (GLOVE) ×3 IMPLANT
GOWN BRE IMP PREV XXLGXLNG (GOWN DISPOSABLE) ×3 IMPLANT
GOWN PREVENTION PLUS XLARGE (GOWN DISPOSABLE) ×6 IMPLANT
NEEDLE 27GAX1X1/2 (NEEDLE) ×3 IMPLANT
NS IRRIG 1000ML POUR BTL (IV SOLUTION) ×3 IMPLANT
PACK BASIN DAY SURGERY FS (CUSTOM PROCEDURE TRAY) ×3 IMPLANT
PAD CAST 3X4 CTTN HI CHSV (CAST SUPPLIES) ×2 IMPLANT
PADDING CAST ABS 4INX4YD NS (CAST SUPPLIES) ×1
PADDING CAST ABS COTTON 4X4 ST (CAST SUPPLIES) ×2 IMPLANT
PADDING CAST COTTON 3X4 STRL (CAST SUPPLIES) ×1
SPONGE GAUZE 4X4 12PLY (GAUZE/BANDAGES/DRESSINGS) ×3 IMPLANT
STOCKINETTE 4X48 STRL (DRAPES) IMPLANT
SUT VICRYL 4-0 PS2 18IN ABS (SUTURE) IMPLANT
SUT VICRYL RAPIDE 4/0 PS 2 (SUTURE) ×9 IMPLANT
SYR BULB 3OZ (MISCELLANEOUS) ×3 IMPLANT
SYR CONTROL 10ML LL (SYRINGE) ×3 IMPLANT
TOWEL OR 17X24 6PK STRL BLUE (TOWEL DISPOSABLE) ×6 IMPLANT
UNDERPAD 30X30 INCONTINENT (UNDERPADS AND DIAPERS) ×3 IMPLANT
WATER STERILE IRR 1000ML POUR (IV SOLUTION) IMPLANT

## 2011-12-19 NOTE — Anesthesia Procedure Notes (Signed)
Procedure Name: LMA Insertion Date/Time: 12/19/2011 11:34 AM Performed by: Caren Macadam Pre-anesthesia Checklist: Patient identified, Emergency Drugs available, Suction available and Patient being monitored Patient Re-evaluated:Patient Re-evaluated prior to inductionOxygen Delivery Method: Circle System Utilized Preoxygenation: Pre-oxygenation with 100% oxygen Intubation Type: IV induction Ventilation: Mask ventilation without difficulty LMA: LMA with gastric port inserted LMA Size: 4.0 Number of attempts: 1 Placement Confirmation: positive ETCO2 Tube secured with: Tape Dental Injury: Teeth and Oropharynx as per pre-operative assessment

## 2011-12-19 NOTE — Brief Op Note (Signed)
12/19/2011  12:27 PM  PATIENT:  [name]  41 y.o. female  PRE-OPERATIVE DIAGNOSIS:  Carpal Tunnel Syndrome, Right and Stenosing Tenosynovitis Right Thumb  POST-OPERATIVE DIAGNOSIS:  Carpal Tunnel Syndrome, Right and Stenosing Tenosynovitis Right Thumb  PROCEDURE:  Procedure(s) (LRB) with comments: CARPAL TUNNEL RELEASE (Right) RELEASE TRIGGER FINGER/A-1 PULLEY (Right)  SURGEON:  Surgeon(s) and Role:    * Nicki Reaper, MD - Primary  PHYSICIAN ASSISTANT:   ASSISTANTS: none   ANESTHESIA:   local and general  EBL:  Total I/O In: 700 [I.V.:700] Out: -   BLOOD ADMINISTERED:none  DRAINS: none   LOCAL MEDICATIONS USED:  MARCAINE     SPECIMEN:  No Specimen  DISPOSITION OF SPECIMEN:  N/A  COUNTS:  YES  TOURNIQUET:   Total Tourniquet Time Documented: Forearm (Right) - 20 minutes  DICTATION: .Other Dictation: Dictation Number [id number]  PLAN OF CARE: Discharge to home after PACU  PATIENT DISPOSITION:  PACU - hemodynamically stable.

## 2011-12-19 NOTE — Transfer of Care (Signed)
Immediate Anesthesia Transfer of Care Note  Patient: [name]  Procedure(s) Performed: Procedure(s) (LRB) with comments: CARPAL TUNNEL RELEASE (Right) RELEASE TRIGGER FINGER/A-1 PULLEY (Right)  Patient Location: PACU  Anesthesia Type:General  Level of Consciousness: awake, alert  and oriented  Airway & Oxygen Therapy: Patient Spontanous Breathing and Patient connected to face mask oxygen  Post-op Assessment: Report given to PACU RN, Post -op Vital signs reviewed and stable and Patient moving all extremities  Post vital signs: Reviewed and stable  Complications: No apparent anesthesia complications

## 2011-12-19 NOTE — Anesthesia Postprocedure Evaluation (Signed)
  Anesthesia Post-op Note  Patient: [name]  Procedure(s) Performed: Procedure(s) (LRB) with comments: CARPAL TUNNEL RELEASE (Right) RELEASE TRIGGER FINGER/A-1 PULLEY (Right)  Patient Location: PACU  Anesthesia Type:General  Level of Consciousness: awake, alert , oriented and patient cooperative  Airway and Oxygen Therapy: Patient Spontanous Breathing  Post-op Pain: mild  Post-op Assessment: Post-op Vital signs reviewed, Patient's Cardiovascular Status Stable, Respiratory Function Stable, Patent Airway, No signs of Nausea or vomiting and Pain level controlled  Post-op Vital Signs: Reviewed and stable  Complications: No apparent anesthesia complications

## 2011-12-19 NOTE — Op Note (Signed)
Dictation Number [id number]

## 2011-12-19 NOTE — Anesthesia Preprocedure Evaluation (Addendum)
Anesthesia Evaluation  Patient identified by MRN, date of birth, ID band Patient awake    Reviewed: Allergy & Precautions, H&P , NPO status , Patient's Chart, lab work & pertinent test results  History of Anesthesia Complications Negative for: history of anesthetic complications  Airway Mallampati: I TM Distance: >3 FB Neck ROM: Full    Dental No notable dental hx. (+) Teeth Intact and Dental Advisory Given   Pulmonary shortness of breath, with exertion, at rest and lying, asthma , sleep apnea (obesity related hypoventilation syndrome, elevated pulm artery pressures) and Continuous Positive Airway Pressure Ventilation , COPD breath sounds clear to auscultation  Pulmonary exam normal       Cardiovascular +CHF Rhythm:Regular Rate:Normal  ECHO '12: mildly reduced LVF, EF 45-50%   Neuro/Psych Seizures -, Well Controlled,  PSYCHIATRIC DISORDERS Anxiety Depression    GI/Hepatic negative GI ROS, Neg liver ROS,   Endo/Other  Morbid obesity  Renal/GU negative Renal ROS     Musculoskeletal   Abdominal (+) + obese,   Peds  Hematology   Anesthesia Other Findings   Reproductive/Obstetrics                          Anesthesia Physical Anesthesia Plan  ASA: III  Anesthesia Plan: General   Post-op Pain Management:    Induction: Intravenous  Airway Management Planned: LMA  Additional Equipment:   Intra-op Plan:   Post-operative Plan:   Informed Consent: I have reviewed the patients History and Physical, chart, labs and discussed the procedure including the risks, benefits and alternatives for the proposed anesthesia with the patient or authorized representative who has indicated his/her understanding and acceptance.   Dental advisory given  Plan Discussed with: CRNA and Surgeon  Anesthesia Plan Comments: (Plan routine monitors, GA- LMA OK)        Anesthesia Quick Evaluation

## 2011-12-19 NOTE — H&P (Signed)
[name] is  A 41 year old  female returning fafter her cervical laminectomy 2-3-4 by Dr. Venetia Maxon. This has gone well. He has cleared her to go ahead and have her right carpal tunnel release and STS release right thumb. Her nerve conductions are positive. This has not responded to conservative treatment. She continues to complain of constant numbness and tingling. Her left carpal tunnel release has done well. Her nerve conductions are positive for cts.  PAST MEDICAL HISTORY: She has no known drug allergies. She is on Zoloft, Lamictal, Xanax, Klonopin, Lasix, potassium, Tramadol, Losartan and Ritalin. She has had wisdom teeth removal and cervical laminectomy, carpal tunnel release, and mass excision.  FAMILY H ISTORY: Positive for arthritis, otherwise negative.  SOCIAL HISTORY: She does not smoke or drink. She is married.  REVIEW OF SYSTEMS: Positive for glasses, contacts, asthma, pneumonia, shortness of breath, sleep disorder, depressSTS rt thumb and CTS rt handion, nervousness, otherwise negative. [name] is an 41 y.o. female.   Chief Complaint: see above HPI: see above  Past Medical History  Diagnosis Date  . Morbid obesity   . Obesity hypoventilation syndrome   . Depression   . Bipolar disorder   . Hypoxemia     history of - no home O2  . Cor pulmonale   . History of pleural effusion   . Obstructive lung disease   . Spinal stenosis in cervical region   . Acute systolic congestive heart failure   . Seasonal allergies     current runny nose  . Arthritis     osteoarthritis bilal. knees  . Urinary incontinence   . Abrasion of breast 06/21/2011    left  . Sleep apnea sleep study 07/02/2010    uses CPAP nightly  . Anxiety     panic attacks  . Shortness of breath     with ADLs  . Asthma     no per PFT 7/13  . Seizures     febrile seizure x 1 as a child. Several times  . IBS (irritable bowel syndrome)   . Carpal tunnel syndrome of left wrist 05/2011    Being  evaluated for MS    Past Surgical History  Procedure Date  . Wisdom tooth extraction   . Mass excision 08/07/2010    right index  . Carpal tunnel release 06/27/2011    Procedure: CARPAL TUNNEL RELEASE;  Surgeon: Nicki Reaper, MD;  Location: [facility];  Service: Orthopedics;  Laterality: Left;  . Posterior cervical fusion/foraminotomy 09/11/2011    Procedure: POSTERIOR CERVICAL FUSION/FORAMINOTOMY LEVEL 1;  Surgeon: Maeola Harman, MD;  Location: [facility];  Service: Neurosurgery;  Laterality: N/A;  Cervical Three-Four Posteior Cervical Fusion and Decompression.    Family History  Problem Relation Age of Onset  . Asthma Mother   . Asthma Maternal Grandmother   . Allergic rhinitis Mother    Social History:  reports that she has never smoked. She has never used smokeless tobacco. She reports that she drinks alcohol. She reports that she does not use illicit drugs.  Allergies: No Known Allergies  Medications Prior to Admission  Medication Sig Dispense Refill  . ALPRAZolam (XANAX) 1 MG tablet Take 1 mg by mouth 3 (three) times daily.       . Ascorbic Acid (VITAMIN C) 500 MG tablet Take 500 mg by mouth daily.        . cetirizine (ZYRTEC) 10 MG tablet Take 10 mg by mouth every morning.      [name]  chlorpheniramine (CHLOR-TRIMETON) 4 MG tablet Take 4 mg by mouth at bedtime.      . clonazePAM (KLONOPIN) 1 MG tablet Take 1 mg by mouth 3 (three) times daily.       . DULoxetine (CYMBALTA) 60 MG capsule Take 120 mg by mouth daily.       . furosemide (LASIX) 40 MG tablet TAKE 2 TABLETS BY MOUTH EVERY MORNING AND TAKE 1 TABLET EVERY EVENING  90 tablet  5  . HYDROmorphone HCl (DILAUDID PO) Take by mouth daily.      [name] KLOR-CON M20 20 MEQ tablet TAKE 1 TABLET BY MOUTH DAILY.  90 tablet  3  . lamoTRIgine (LAMICTAL) 200 MG tablet Take 200 mg by mouth 2 (two) times daily.       [name] lidocaine (LIDODERM) 5 % Place 1 patch onto the skin daily. Remove & Discard patch within 12 hours or as directed  by MD      . losartan (COZAAR) 25 MG tablet TAKE 1 TABLET BY MOUTH TWICE A DAY  60 tablet  6  . LYRICA 300 MG capsule Take 1 tablet by mouth Twice daily.      . methylphenidate (RITALIN) 10 MG tablet 5-20 mg. Takes 20 mg morning and noon, and 5-10 mg at 4pm      . Multiple Vitamin (MULTIVITAMIN) tablet Take 1 tablet by mouth daily.        . norethindrone-ethinyl estradiol-iron (MICROGESTIN FE 1.5/30) 1.5-30 MG-MCG tablet Take 1 tablet by mouth at bedtime. Takes continuously      . traMADol (ULTRAM) 50 MG tablet Take 2 tablets by mouth 4 times daily as needed. For pain      . VOLTAREN 1 % GEL Apply three times daily as needed      . Zinc 50 MG CAPS Take 50 mg by mouth daily.        Results for orders placed during the hospital encounter of 12/19/11 (from the past 48 hour(s))  BASIC METABOLIC PANEL     Status: Abnormal   Collection Time   12/18/11  4:00 PM      Component Value Range Comment   Sodium 140  135 - 145 mEq/L    Potassium 3.9  3.5 - 5.1 mEq/L    Chloride 99  96 - 112 mEq/L    CO2 29  19 - 32 mEq/L    Glucose, Bld 119 (*) 70 - 99 mg/dL    BUN 8  6 - 23 mg/dL    Creatinine, Ser 1.61  0.50 - 1.10 mg/dL    Calcium 9.4  8.4 - 09.6 mg/dL    GFR calc non Af Amer >90  >90 mL/min    GFR calc Af Amer >90  >90 mL/min     No results found.   Pertinent items are noted in HPI.  Height 5\' 5"  (1.651 m), weight 167.377 kg (369 lb).  General appearance: alert, cooperative and appears stated age Head: Normocephalic, without obvious abnormality Neck: no adenopathy Resp: clear to auscultation bilaterally Cardio: regular rate and rhythm, S1, S2 normal, no murmur, click, rub or gallop GI: soft, non-tender; bowel sounds normal; no masses,  no organomegaly Extremities: extremities normal, atraumatic, no cyanosis or edema Pulses: 2+ and symmetric Skin: Skin color, texture, turgor normal. No rashes or lesions Neurologic: Grossly normal Incision/Wound: na  Assessment/Plan Diagnosis:  Right carpal tunnel syndrome and STS right thumb.  The pre, peri and post op course are discussed along with risks and complications. She is  aware there is no guarantee with surgery, possibility of infection, recurrence, injury to arteries, nerves and tendons, incomplete relief of symptoms and dystrophy.  This will be scheduled as an outpatient for release right carpal canal and release A-1 pulley right thumb.  [name] 12/19/2011, 9:54 AM

## 2011-12-20 NOTE — Op Note (Signed)
[name], [name] NO.:  [id number]  MEDICAL RECORD NO.:  [id number]  LOCATION:                                 FACILITY:  PHYSICIAN:  Cindee Salt, M.D.       DATE OF BIRTH:  07/26/1970  DATE OF PROCEDURE:  12/19/2011 DATE OF DISCHARGE:                              OPERATIVE REPORT   PREOPERATIVE DIAGNOSIS:  Carpal tunnel syndrome, right arm with stenosing tenosynovitis, right thumb.  POSTOPERATIVE DIAGNOSIS:  Carpal tunnel syndrome, right arm with stenosing tenosynovitis, right thumb.  OPERATION:  Release of A1 pulley, right thumb with carpal tunnel release, right hand.  SURGEON:  Cindee Salt, M.D.  ANESTHESIA:  General with local infiltration.  ANESTHESIOLOGIST:  Germaine Pomfret, M.D.  HISTORY:  The patient is a 41 year old female with a history of carpal tunnel syndrome bilaterally.  She has undergone release on her left side, is admitted now for release of the right.  She has developed triggering of her right thumb.  She is desirous of release at each of these.  Pre, peri and postoperative course have been discussed along with risks and complications.  She is aware that there is no guarantee with the surgery; possibility of infection; recurrence of injury to arteries, nerves, tendons; incomplete relief of symptoms and dystrophy. In the preoperative area, the patient is seen, the extremity marked by both the patient and surgeon, and antibiotic given.  PROCEDURE:  The patient was brought to the operating room where a general anesthetic was carried out without difficulty.  She was prepped using ChloraPrep, supine position with the right arm free.  A 3-minute dry time was allowed.  Time-out taken, confirming the patient and procedure.  The limb was exsanguinated with an Esmarch bandage. Tourniquet placed on the forearm was inflated to 250 mmHg.  Transverse incision was made over the A1 pulley of the right thumb, carried down through the  subcutaneous tissue.  Neurovascular bundles were identified and protected both radially and ulnarly.  The A1 pulley was identified with protection to the neurovascular bundles.  The A1 pulley was released on its radial aspect.  The thumb placed through a full range of motion, no further triggering was noted.  The proximal tenosynovial tissue was separated with blunt dissection.  The wound was irrigated and the skin was closed with interrupted 4-0 Vicryl Rapide sutures.  A separate longitudinal incision was then made at the palm and lined with radial aspect of the ring finger, carried down through the subcutaneous tissue.  Bleeders were again electrocauterized with bipolar.  The palmar fascia was split.  Superficial palmar arch identified.  The flexor tendon of the ring and little finger identified to the ulnar side of the median nerve.  The carpal retinaculum was incised with sharp dissection. The tourniquet at this point in time failed, this was partially removed. An Esmarch bandage was placed for tourniquet control proximally.  The Sewall retractor and right angle retractor were placed between the skin and forearm fascia.  The fascia was released for approximately 1.5 cm proximal to the wrist crease under direct vision.  The canal was explored.  Tenosynovial tissue was markedly thickened.  The very hyperemic nerve was noted.  The wound was irrigated.  The skin was then closed with interrupted 4-0 Vicryl Rapide sutures.  Local infiltration with 0.25% Marcaine without epinephrine was given to each wound, approximately 9 mL total was used.  A sterile compressive dressing was applied.  On removal of the Esmarch, the finger was immediately pinked.  She was taken to the recovery room for observation in satisfactory condition.  She will be discharged home to return in 1 week, on Vicodin.          ______________________________ Cindee Salt, M.D.     GK/MEDQ  D:  12/19/2011  T:   12/20/2011  Job:  [id number]

## 2011-12-21 ENCOUNTER — Encounter (HOSPITAL_BASED_OUTPATIENT_CLINIC_OR_DEPARTMENT_OTHER): Payer: Self-pay | Admitting: Orthopedic Surgery

## 2011-12-25 ENCOUNTER — Ambulatory Visit (INDEPENDENT_AMBULATORY_CARE_PROVIDER_SITE_OTHER): Payer: PRIVATE HEALTH INSURANCE | Admitting: Psychology

## 2011-12-25 DIAGNOSIS — F319 Bipolar disorder, unspecified: Secondary | ICD-10-CM

## 2012-01-01 ENCOUNTER — Ambulatory Visit: Payer: Self-pay | Admitting: Psychology

## 2012-01-09 ENCOUNTER — Ambulatory Visit: Payer: Self-pay | Admitting: Psychology

## 2012-01-15 ENCOUNTER — Ambulatory Visit (INDEPENDENT_AMBULATORY_CARE_PROVIDER_SITE_OTHER): Payer: PRIVATE HEALTH INSURANCE | Admitting: Psychology

## 2012-01-15 DIAGNOSIS — F319 Bipolar disorder, unspecified: Secondary | ICD-10-CM

## 2012-01-20 ENCOUNTER — Telehealth: Payer: Self-pay | Admitting: Pulmonary Disease

## 2012-01-20 NOTE — Telephone Encounter (Signed)
Download 12/13 -CPAP 13 cm very effective Good usage No leak

## 2012-01-21 NOTE — Telephone Encounter (Signed)
I spoke with patient about results and she verbalized understanding and had no questions. Pt wanted to let RA know that since she saw TO she has had an Asthma attack and had to use her rescue inhaler. I advised pt next time this happened to give [place] a call so we can see her. She voiced her understanding. She is fine now. Will forward to RA as an [place]

## 2012-01-21 NOTE — Telephone Encounter (Signed)
ok 

## 2012-01-21 NOTE — Telephone Encounter (Signed)
lmomtcb x1 for pt on cell. Called home # NA

## 2012-01-22 ENCOUNTER — Ambulatory Visit (INDEPENDENT_AMBULATORY_CARE_PROVIDER_SITE_OTHER): Payer: PRIVATE HEALTH INSURANCE | Admitting: Psychology

## 2012-01-22 DIAGNOSIS — F319 Bipolar disorder, unspecified: Secondary | ICD-10-CM

## 2012-01-24 ENCOUNTER — Ambulatory Visit (INDEPENDENT_AMBULATORY_CARE_PROVIDER_SITE_OTHER): Payer: PRIVATE HEALTH INSURANCE | Admitting: Psychology

## 2012-01-24 DIAGNOSIS — F319 Bipolar disorder, unspecified: Secondary | ICD-10-CM

## 2012-01-29 ENCOUNTER — Ambulatory Visit (INDEPENDENT_AMBULATORY_CARE_PROVIDER_SITE_OTHER): Payer: PRIVATE HEALTH INSURANCE | Admitting: Psychology

## 2012-01-29 DIAGNOSIS — F319 Bipolar disorder, unspecified: Secondary | ICD-10-CM

## 2012-02-05 ENCOUNTER — Ambulatory Visit: Payer: PRIVATE HEALTH INSURANCE | Admitting: Psychology

## 2012-02-11 ENCOUNTER — Telehealth: Payer: Self-pay | Admitting: Obstetrics and Gynecology

## 2012-02-11 NOTE — Telephone Encounter (Signed)
Per pt chart she is needing aex with u/s for ovary check scheduled in 03/2012. Advised pt that schedule template is not open for March, advised pt to call back towards the end of January to schedule apt with u/s  Pt voiced understanding.  Darien Ramus, CMA

## 2012-02-12 ENCOUNTER — Ambulatory Visit (INDEPENDENT_AMBULATORY_CARE_PROVIDER_SITE_OTHER): Payer: PRIVATE HEALTH INSURANCE | Admitting: Psychology

## 2012-02-12 DIAGNOSIS — F319 Bipolar disorder, unspecified: Secondary | ICD-10-CM

## 2012-02-19 ENCOUNTER — Ambulatory Visit (INDEPENDENT_AMBULATORY_CARE_PROVIDER_SITE_OTHER): Payer: Medicare Other | Admitting: Psychology

## 2012-02-19 DIAGNOSIS — F319 Bipolar disorder, unspecified: Secondary | ICD-10-CM | POA: Diagnosis not present

## 2012-02-26 ENCOUNTER — Ambulatory Visit (INDEPENDENT_AMBULATORY_CARE_PROVIDER_SITE_OTHER): Payer: Medicare Other | Admitting: Psychology

## 2012-02-26 DIAGNOSIS — F319 Bipolar disorder, unspecified: Secondary | ICD-10-CM | POA: Diagnosis not present

## 2012-03-04 ENCOUNTER — Ambulatory Visit: Payer: 59 | Admitting: Psychology

## 2012-03-04 DIAGNOSIS — M25579 Pain in unspecified ankle and joints of unspecified foot: Secondary | ICD-10-CM | POA: Diagnosis not present

## 2012-03-17 DIAGNOSIS — M79609 Pain in unspecified limb: Secondary | ICD-10-CM | POA: Diagnosis not present

## 2012-03-17 DIAGNOSIS — R9409 Abnormal results of other function studies of central nervous system: Secondary | ICD-10-CM | POA: Diagnosis not present

## 2012-03-18 ENCOUNTER — Other Ambulatory Visit: Payer: Self-pay | Admitting: Diagnostic Neuroimaging

## 2012-03-18 ENCOUNTER — Ambulatory Visit (INDEPENDENT_AMBULATORY_CARE_PROVIDER_SITE_OTHER): Payer: Medicare Other | Admitting: Psychology

## 2012-03-18 DIAGNOSIS — F319 Bipolar disorder, unspecified: Secondary | ICD-10-CM | POA: Diagnosis not present

## 2012-03-18 DIAGNOSIS — R9409 Abnormal results of other function studies of central nervous system: Secondary | ICD-10-CM

## 2012-03-18 DIAGNOSIS — M79609 Pain in unspecified limb: Secondary | ICD-10-CM

## 2012-03-25 ENCOUNTER — Ambulatory Visit (INDEPENDENT_AMBULATORY_CARE_PROVIDER_SITE_OTHER): Payer: Medicare Other | Admitting: Psychology

## 2012-03-25 DIAGNOSIS — F319 Bipolar disorder, unspecified: Secondary | ICD-10-CM | POA: Diagnosis not present

## 2012-03-31 ENCOUNTER — Other Ambulatory Visit: Payer: Self-pay

## 2012-04-01 ENCOUNTER — Ambulatory Visit (INDEPENDENT_AMBULATORY_CARE_PROVIDER_SITE_OTHER): Payer: PRIVATE HEALTH INSURANCE | Admitting: Psychology

## 2012-04-01 DIAGNOSIS — F319 Bipolar disorder, unspecified: Secondary | ICD-10-CM

## 2012-04-03 ENCOUNTER — Ambulatory Visit
Admission: RE | Admit: 2012-04-03 | Discharge: 2012-04-03 | Disposition: A | Discharge status: Home / Self Care | Payer: 59 | Source: Ambulatory Visit | Attending: Diagnostic Neuroimaging | Admitting: Diagnostic Neuroimaging

## 2012-04-03 DIAGNOSIS — R9409 Abnormal results of other function studies of central nervous system: Secondary | ICD-10-CM

## 2012-04-03 DIAGNOSIS — M79609 Pain in unspecified limb: Secondary | ICD-10-CM

## 2012-04-03 DIAGNOSIS — R413 Other amnesia: Secondary | ICD-10-CM | POA: Diagnosis not present

## 2012-04-03 MED ORDER — GADOBENATE DIMEGLUMINE 529 MG/ML IV SOLN
20.0000 mL | Freq: Once | INTRAVENOUS | Status: AC | PRN
  Administered 2012-04-03: 20 mL via INTRAVENOUS

## 2012-04-07 ENCOUNTER — Telehealth: Payer: Self-pay

## 2012-04-07 NOTE — Telephone Encounter (Signed)
LVM for pt to return call regarding scheduling an aex apt with u/s before due to increased BMI.   Darien Ramus, CMA

## 2012-04-08 ENCOUNTER — Ambulatory Visit (INDEPENDENT_AMBULATORY_CARE_PROVIDER_SITE_OTHER): Payer: PRIVATE HEALTH INSURANCE | Admitting: Psychology

## 2012-04-08 DIAGNOSIS — F319 Bipolar disorder, unspecified: Secondary | ICD-10-CM | POA: Diagnosis not present

## 2012-04-09 DIAGNOSIS — M7660 Achilles tendinitis, unspecified leg: Secondary | ICD-10-CM | POA: Diagnosis not present

## 2012-04-11 ENCOUNTER — Telehealth: Payer: Self-pay | Admitting: Obstetrics and Gynecology

## 2012-04-11 NOTE — Telephone Encounter (Signed)
LVM to advised that aex with u/s was scheduled for 05/07/12 @ 9:00. Pt to call if apt is not ok  Darien Ramus, CMA

## 2012-04-15 ENCOUNTER — Ambulatory Visit (INDEPENDENT_AMBULATORY_CARE_PROVIDER_SITE_OTHER): Payer: PRIVATE HEALTH INSURANCE | Admitting: Psychology

## 2012-04-15 DIAGNOSIS — F319 Bipolar disorder, unspecified: Secondary | ICD-10-CM

## 2012-04-21 ENCOUNTER — Encounter: Payer: Self-pay | Admitting: Cardiology

## 2012-04-21 ENCOUNTER — Ambulatory Visit (INDEPENDENT_AMBULATORY_CARE_PROVIDER_SITE_OTHER): Payer: 59 | Admitting: Cardiology

## 2012-04-21 VITALS — BP 152/70 | HR 91 | Ht 65.0 in | Wt 368.0 lb

## 2012-04-21 DIAGNOSIS — I5022 Chronic systolic (congestive) heart failure: Secondary | ICD-10-CM | POA: Diagnosis not present

## 2012-04-21 MED ORDER — FUROSEMIDE 40 MG PO TABS: ORAL_TABLET | ORAL | Status: DC

## 2012-04-21 NOTE — Progress Notes (Signed)
HPI This patient presents for evaluation of a mildly reduced ejection fraction. Since I last saw her she has had continued problems with stress and anxiety.  .  She is recovering further from the cervical neck surgery that she had last week. She's having no acute symptoms. She denies any acute shortness of breath, PND or orthopnea. She has had no palpitations, presyncope or syncope. She has had no chest pressure, neck or arm discomfort.   No Known Allergies  Current Outpatient Prescriptions  Medication Sig Dispense Refill  . ALPRAZolam (XANAX) 1 MG tablet Take 1 mg by mouth 3 (three) times daily.       . Ascorbic Acid (VITAMIN C) 500 MG tablet Take 500 mg by mouth daily.        . cetirizine (ZYRTEC) 10 MG tablet Take 10 mg by mouth every morning.      . Cholecalciferol (VITAMIN D) 2000 UNITS tablet Take 2,000 Units by mouth daily.      . clonazePAM (KLONOPIN) 1 MG tablet Take 1 mg by mouth 3 (three) times daily.       . cyanocobalamin 1000 MCG tablet Take 100 mcg by mouth daily.      . furosemide (LASIX) 40 MG tablet TAKE 2 TABLETS BY MOUTH EVERY MORNING AND TAKE 1 TABLET EVERY EVENING  90 tablet  5  . KLOR-CON M20 20 MEQ tablet TAKE 1 TABLET BY MOUTH DAILY.  90 tablet  3  . lamoTRIgine (LAMICTAL) 200 MG tablet Take 200 mg by mouth 2 (two) times daily.       [name] lidocaine (LIDODERM) 5 % Place 1 patch onto the skin daily. Remove & Discard patch within 12 hours or as directed by MD      . losartan (COZAAR) 25 MG tablet TAKE 1 TABLET BY MOUTH TWICE A DAY  60 tablet  6  . LYRICA 300 MG capsule Take 1 tablet by mouth Twice daily.      . methylphenidate (RITALIN) 10 MG tablet 5-20 mg. Takes 20 mg morning and noon, and 5-10 mg at 4pm      . Multiple Vitamin (MULTIVITAMIN) tablet Take 1 tablet by mouth daily.        . norethindrone-ethinyl estradiol-iron (MICROGESTIN FE 1.5/30) 1.5-30 MG-MCG tablet Take 1 tablet by mouth at bedtime. Takes continuously      . SAVELLA 100 MG TABS tablet Take 1 tablet  by mouth 2 (two) times daily.      . traMADol (ULTRAM) 50 MG tablet Take 2 tablets by mouth 4 times daily as needed. For pain      . VOLTAREN 1 % GEL Apply three times daily as needed      . Zinc 50 MG CAPS Take 50 mg by mouth daily.       No current facility-administered medications for this visit.    Past Medical History  Diagnosis Date  . Morbid obesity   . Obesity hypoventilation syndrome   . Depression   . Bipolar disorder   . Hypoxemia     history of - no home O2  . Cor pulmonale   . History of pleural effusion   . Obstructive lung disease   . Spinal stenosis in cervical region   . Acute systolic congestive heart failure   . Seasonal allergies     current runny nose  . Arthritis     osteoarthritis bilal. knees  . Urinary incontinence   . Abrasion of breast 06/21/2011    left  .  Sleep apnea sleep study 07/02/2010    uses CPAP nightly  . Anxiety     panic attacks  . Shortness of breath     with ADLs  . Asthma     no per PFT 7/13  . Seizures     febrile seizure x 1 as a child. Several times  . IBS (irritable bowel syndrome)   . Carpal tunnel syndrome of left wrist 05/2011    Being evaluated for MS    Past Surgical History  Procedure Laterality Date  . Wisdom tooth extraction    . Mass excision  08/07/2010    right index  . Carpal tunnel release  06/27/2011    Procedure: CARPAL TUNNEL RELEASE;  Surgeon: Nicki Reaper, MD;  Location: [facility];  Service: Orthopedics;  Laterality: Left;  . Posterior cervical fusion/foraminotomy  09/11/2011    Procedure: POSTERIOR CERVICAL FUSION/FORAMINOTOMY LEVEL 1;  Surgeon: Maeola Harman, MD;  Location: [facility];  Service: Neurosurgery;  Laterality: N/A;  Cervical Three-Four Posteior Cervical Fusion and Decompression.  . Carpal tunnel release  12/19/2011    Procedure: CARPAL TUNNEL RELEASE;  Surgeon: Nicki Reaper, MD;  Location: [facility];  Service: Orthopedics;  Laterality: Right;  . Trigger finger  release  12/19/2011    Procedure: RELEASE TRIGGER FINGER/A-1 PULLEY;  Surgeon: Nicki Reaper, MD;  Location: [facility];  Service: Orthopedics;  Laterality: Right;    ROS:  As stated in the HPI and negative for all other systems.  PHYSICAL EXAM BP 152/70  Pulse 91  Ht 5\' 5"  (1.651 m)  Wt 368 lb (166.924 kg)  BMI 61.24 kg/m2 GENERAL:  Well appearing HEENT:  Pupils equal round and reactive, fundi not visualized, oral mucosa unremarkable LUNGS:  Clear to auscultation bilaterally BACK:  No CVA tenderness CHEST:  Unremarkable HEART:  PMI not displaced or sustained,S1 and S2 within normal limits, no S3, no S4, no clicks, no rubs, no murmurs ABD:  Flat, positive bowel sounds normal in frequency in pitch, no bruits, no rebound, no guarding, no midline pulsatile mass, no hepatomegaly, no splenomegaly, morbidly obese EXT:  2 plus pulses throughout, no edema, no cyanosis no clubbing PSYCH:  Cognitively intact, oriented to person place, tearful  EKG:    Sinus rhythm, rate 91, axis within normal limits, minimal QT prolongation, RSR prime V1 and V2, no acute ST-T wave changes. No change from previous.  04/21/2012  ASSESSMENT AND PLAN  Cardiomyopathy - She has a mild cardiomyopathy and she seems to be euvolemic today although it is difficult to assess. She's tolerating the medications as listed. I will suggest no change to her therapy.  HTN -  Her BP is slightly elevated.  However, it is labile.  I will not change her meds at this point.   Anxeity -  She is quite anxious.  I am glad that she is seeing her psychologist tomorrow.

## 2012-04-21 NOTE — Patient Instructions (Addendum)
The current medical regimen is effective;  continue present plan and medications.  Follow up in 6 months with Dr [name].  You will receive a letter in the mail 2 months before you are due.  Please call us when you receive this letter to schedule your follow up appointment.  

## 2012-04-22 ENCOUNTER — Ambulatory Visit (INDEPENDENT_AMBULATORY_CARE_PROVIDER_SITE_OTHER): Payer: PRIVATE HEALTH INSURANCE | Admitting: Psychology

## 2012-04-22 DIAGNOSIS — F319 Bipolar disorder, unspecified: Secondary | ICD-10-CM

## 2012-04-23 ENCOUNTER — Telehealth: Payer: Self-pay | Admitting: Cardiology

## 2012-04-23 NOTE — Telephone Encounter (Signed)
New Prob   Pt has picked up a cold and would like to know if Dr. Antoine Poche can write her a prescription for an antibiotic.

## 2012-04-23 NOTE — Telephone Encounter (Signed)
Dr Antoine Poche does not RX antibiotics.  Pt having s/s of nasal congestion - advised to use plain saline nasal spray.  She should see her PCP is s/s continue.

## 2012-04-23 NOTE — Telephone Encounter (Signed)
Line busy

## 2012-04-29 ENCOUNTER — Ambulatory Visit (INDEPENDENT_AMBULATORY_CARE_PROVIDER_SITE_OTHER): Payer: PRIVATE HEALTH INSURANCE | Admitting: Psychology

## 2012-04-29 DIAGNOSIS — F319 Bipolar disorder, unspecified: Secondary | ICD-10-CM | POA: Diagnosis not present

## 2012-05-05 ENCOUNTER — Other Ambulatory Visit: Payer: Self-pay | Admitting: Obstetrics and Gynecology

## 2012-05-05 ENCOUNTER — Other Ambulatory Visit: Payer: Self-pay

## 2012-05-05 DIAGNOSIS — Z1231 Encounter for screening mammogram for malignant neoplasm of breast: Secondary | ICD-10-CM

## 2012-05-06 ENCOUNTER — Ambulatory Visit (INDEPENDENT_AMBULATORY_CARE_PROVIDER_SITE_OTHER): Payer: PRIVATE HEALTH INSURANCE | Admitting: Psychology

## 2012-05-06 DIAGNOSIS — F319 Bipolar disorder, unspecified: Secondary | ICD-10-CM

## 2012-05-13 ENCOUNTER — Ambulatory Visit: Payer: Self-pay | Admitting: Psychology

## 2012-05-19 ENCOUNTER — Telehealth: Payer: Self-pay

## 2012-05-19 NOTE — Telephone Encounter (Signed)
Called w/ results, no answer, will mail.

## 2012-05-20 ENCOUNTER — Ambulatory Visit (INDEPENDENT_AMBULATORY_CARE_PROVIDER_SITE_OTHER): Payer: PRIVATE HEALTH INSURANCE | Admitting: Psychology

## 2012-05-20 DIAGNOSIS — F319 Bipolar disorder, unspecified: Secondary | ICD-10-CM | POA: Diagnosis not present

## 2012-05-26 ENCOUNTER — Telehealth: Payer: Self-pay

## 2012-05-26 NOTE — Telephone Encounter (Signed)
Spouse recvd. MRI results in mail, requested interpretation. Spoke to spouse, gave impression.

## 2012-05-27 ENCOUNTER — Ambulatory Visit (INDEPENDENT_AMBULATORY_CARE_PROVIDER_SITE_OTHER): Payer: PRIVATE HEALTH INSURANCE | Admitting: Psychology

## 2012-05-27 DIAGNOSIS — F319 Bipolar disorder, unspecified: Secondary | ICD-10-CM

## 2012-06-02 DIAGNOSIS — Z79899 Other long term (current) drug therapy: Secondary | ICD-10-CM | POA: Diagnosis not present

## 2012-06-02 DIAGNOSIS — E559 Vitamin D deficiency, unspecified: Secondary | ICD-10-CM | POA: Diagnosis not present

## 2012-06-02 DIAGNOSIS — I10 Essential (primary) hypertension: Secondary | ICD-10-CM | POA: Diagnosis not present

## 2012-06-02 DIAGNOSIS — M4802 Spinal stenosis, cervical region: Secondary | ICD-10-CM | POA: Diagnosis not present

## 2012-06-02 DIAGNOSIS — M79609 Pain in unspecified limb: Secondary | ICD-10-CM | POA: Diagnosis not present

## 2012-06-02 DIAGNOSIS — R7309 Other abnormal glucose: Secondary | ICD-10-CM | POA: Diagnosis not present

## 2012-06-02 DIAGNOSIS — M542 Cervicalgia: Secondary | ICD-10-CM | POA: Diagnosis not present

## 2012-06-02 DIAGNOSIS — M4712 Other spondylosis with myelopathy, cervical region: Secondary | ICD-10-CM | POA: Diagnosis not present

## 2012-06-02 DIAGNOSIS — S139XXA Sprain of joints and ligaments of unspecified parts of neck, initial encounter: Secondary | ICD-10-CM | POA: Diagnosis not present

## 2012-06-02 DIAGNOSIS — E782 Mixed hyperlipidemia: Secondary | ICD-10-CM | POA: Diagnosis not present

## 2012-06-03 ENCOUNTER — Ambulatory Visit (INDEPENDENT_AMBULATORY_CARE_PROVIDER_SITE_OTHER): Payer: PRIVATE HEALTH INSURANCE | Admitting: Psychology

## 2012-06-03 DIAGNOSIS — F319 Bipolar disorder, unspecified: Secondary | ICD-10-CM

## 2012-06-04 ENCOUNTER — Other Ambulatory Visit: Payer: Self-pay | Admitting: Internal Medicine

## 2012-06-10 ENCOUNTER — Ambulatory Visit (INDEPENDENT_AMBULATORY_CARE_PROVIDER_SITE_OTHER): Payer: PRIVATE HEALTH INSURANCE | Admitting: Psychology

## 2012-06-10 DIAGNOSIS — F319 Bipolar disorder, unspecified: Secondary | ICD-10-CM

## 2012-06-16 DIAGNOSIS — Z3002 Counseling and instruction in natural family planning to avoid pregnancy: Secondary | ICD-10-CM | POA: Diagnosis not present

## 2012-06-16 DIAGNOSIS — Z01419 Encounter for gynecological examination (general) (routine) without abnormal findings: Secondary | ICD-10-CM | POA: Diagnosis not present

## 2012-06-16 DIAGNOSIS — E669 Obesity, unspecified: Secondary | ICD-10-CM | POA: Diagnosis not present

## 2012-06-17 ENCOUNTER — Ambulatory Visit (INDEPENDENT_AMBULATORY_CARE_PROVIDER_SITE_OTHER): Payer: PRIVATE HEALTH INSURANCE | Admitting: Psychology

## 2012-06-17 DIAGNOSIS — F319 Bipolar disorder, unspecified: Secondary | ICD-10-CM

## 2012-06-24 ENCOUNTER — Ambulatory Visit: Payer: Self-pay

## 2012-06-24 ENCOUNTER — Ambulatory Visit (INDEPENDENT_AMBULATORY_CARE_PROVIDER_SITE_OTHER): Payer: PRIVATE HEALTH INSURANCE | Admitting: Psychology

## 2012-06-24 DIAGNOSIS — F319 Bipolar disorder, unspecified: Secondary | ICD-10-CM

## 2012-06-25 ENCOUNTER — Other Ambulatory Visit: Payer: Self-pay | Admitting: Internal Medicine

## 2012-06-27 NOTE — Telephone Encounter (Signed)
RA patient-sent to [facility].

## 2012-06-30 DIAGNOSIS — G894 Chronic pain syndrome: Secondary | ICD-10-CM | POA: Diagnosis not present

## 2012-06-30 DIAGNOSIS — M2550 Pain in unspecified joint: Secondary | ICD-10-CM | POA: Diagnosis not present

## 2012-06-30 DIAGNOSIS — Z79899 Other long term (current) drug therapy: Secondary | ICD-10-CM | POA: Diagnosis not present

## 2012-06-30 DIAGNOSIS — IMO0001 Reserved for inherently not codable concepts without codable children: Secondary | ICD-10-CM | POA: Diagnosis not present

## 2012-07-01 ENCOUNTER — Ambulatory Visit (INDEPENDENT_AMBULATORY_CARE_PROVIDER_SITE_OTHER): Payer: PRIVATE HEALTH INSURANCE | Admitting: Psychology

## 2012-07-01 DIAGNOSIS — F319 Bipolar disorder, unspecified: Secondary | ICD-10-CM

## 2012-07-08 ENCOUNTER — Ambulatory Visit (INDEPENDENT_AMBULATORY_CARE_PROVIDER_SITE_OTHER): Payer: PRIVATE HEALTH INSURANCE | Admitting: Psychology

## 2012-07-08 DIAGNOSIS — F319 Bipolar disorder, unspecified: Secondary | ICD-10-CM

## 2012-07-14 DIAGNOSIS — E559 Vitamin D deficiency, unspecified: Secondary | ICD-10-CM | POA: Diagnosis not present

## 2012-07-14 DIAGNOSIS — E119 Type 2 diabetes mellitus without complications: Secondary | ICD-10-CM | POA: Diagnosis not present

## 2012-07-14 DIAGNOSIS — I10 Essential (primary) hypertension: Secondary | ICD-10-CM | POA: Diagnosis not present

## 2012-07-15 ENCOUNTER — Ambulatory Visit (INDEPENDENT_AMBULATORY_CARE_PROVIDER_SITE_OTHER): Payer: PRIVATE HEALTH INSURANCE | Admitting: Psychology

## 2012-07-15 DIAGNOSIS — F319 Bipolar disorder, unspecified: Secondary | ICD-10-CM

## 2012-07-22 ENCOUNTER — Ambulatory Visit (INDEPENDENT_AMBULATORY_CARE_PROVIDER_SITE_OTHER): Payer: PRIVATE HEALTH INSURANCE | Admitting: Psychology

## 2012-07-22 DIAGNOSIS — F319 Bipolar disorder, unspecified: Secondary | ICD-10-CM

## 2012-07-28 DIAGNOSIS — G894 Chronic pain syndrome: Secondary | ICD-10-CM | POA: Diagnosis not present

## 2012-07-28 DIAGNOSIS — M5030 Other cervical disc degeneration, unspecified cervical region: Secondary | ICD-10-CM | POA: Diagnosis not present

## 2012-07-28 DIAGNOSIS — Z79899 Other long term (current) drug therapy: Secondary | ICD-10-CM | POA: Diagnosis not present

## 2012-07-28 DIAGNOSIS — M1990 Unspecified osteoarthritis, unspecified site: Secondary | ICD-10-CM | POA: Diagnosis not present

## 2012-07-29 ENCOUNTER — Ambulatory Visit (INDEPENDENT_AMBULATORY_CARE_PROVIDER_SITE_OTHER): Payer: PRIVATE HEALTH INSURANCE | Admitting: Psychology

## 2012-07-29 DIAGNOSIS — F319 Bipolar disorder, unspecified: Secondary | ICD-10-CM

## 2012-08-04 ENCOUNTER — Telehealth: Payer: Self-pay | Admitting: Pulmonary Disease

## 2012-08-04 DIAGNOSIS — M2550 Pain in unspecified joint: Secondary | ICD-10-CM | POA: Diagnosis not present

## 2012-08-04 NOTE — Telephone Encounter (Signed)
I spoke with pt. . Per pt her father had muscle aches/pain and had to stop zyrtec. Pt states she has chronic pain and goes to the pain clinic. They are ? Fibromyalgia. She is wanting to know if some of her pain could be coming from the zytrec since she takes this daily as well. She is wanting to know if she needs to change this to something diff.  Please advise Dr. Vassie Loll thanks  No Known Allergies

## 2012-08-04 NOTE — Telephone Encounter (Signed)
lmomtcb x1 

## 2012-08-04 NOTE — Telephone Encounter (Signed)
Can trial off zyrtec x 1 month If something else needed, can use allegra

## 2012-08-05 ENCOUNTER — Ambulatory Visit: Payer: Self-pay | Admitting: Psychology

## 2012-08-05 NOTE — Telephone Encounter (Signed)
Patient returning call.  Will be at home until 4:30 pm.

## 2012-08-05 NOTE — Telephone Encounter (Signed)
ATC patient no answer [id number] 

## 2012-08-05 NOTE — Telephone Encounter (Signed)
Pt aware of recs. Nothing further was needed 

## 2012-08-06 DIAGNOSIS — M2550 Pain in unspecified joint: Secondary | ICD-10-CM | POA: Diagnosis not present

## 2012-08-11 DIAGNOSIS — M2550 Pain in unspecified joint: Secondary | ICD-10-CM | POA: Diagnosis not present

## 2012-08-12 ENCOUNTER — Ambulatory Visit (INDEPENDENT_AMBULATORY_CARE_PROVIDER_SITE_OTHER): Payer: PRIVATE HEALTH INSURANCE | Admitting: Psychology

## 2012-08-12 DIAGNOSIS — F319 Bipolar disorder, unspecified: Secondary | ICD-10-CM

## 2012-08-13 DIAGNOSIS — M2550 Pain in unspecified joint: Secondary | ICD-10-CM | POA: Diagnosis not present

## 2012-08-14 ENCOUNTER — Encounter: Payer: Self-pay | Admitting: Pulmonary Disease

## 2012-08-14 ENCOUNTER — Ambulatory Visit (INDEPENDENT_AMBULATORY_CARE_PROVIDER_SITE_OTHER): Payer: Medicare Other | Admitting: Pulmonary Disease

## 2012-08-14 VITALS — BP 158/84 | HR 106 | Temp 98.1°F | Ht 65.0 in | Wt 368.0 lb

## 2012-08-14 DIAGNOSIS — J4520 Mild intermittent asthma, uncomplicated: Secondary | ICD-10-CM

## 2012-08-14 DIAGNOSIS — J45909 Unspecified asthma, uncomplicated: Secondary | ICD-10-CM

## 2012-08-14 DIAGNOSIS — G4733 Obstructive sleep apnea (adult) (pediatric): Secondary | ICD-10-CM | POA: Diagnosis not present

## 2012-08-14 NOTE — Patient Instructions (Addendum)
Your CPAP is set at 13 cm OK to take claritin or Loratidine instead of zyrtec

## 2012-08-14 NOTE — Assessment & Plan Note (Signed)
Ct cpap 13 cm  Weight loss encouraged, compliance with goal of at least 4-6 hrs every night is the expectation. Advised against medications with sedative side effects Cautioned against driving when sleepy - understanding that sleepiness will vary on a day to day basis

## 2012-08-14 NOTE — Progress Notes (Signed)
  Subjective:    Patient ID: [name], female    DOB: 05-Dec-1970, 42 y.o.   MRN: [id number]  HPI  PCP [name]   41 WF never smoker with morbid obesity for FU of obstructive sleep apnea on CPAP 13 cm, obesity hypoventilation & 'asthma'  She also has DM-2 & bipolar [name])  She was admitted 4/12 for worsening dyspnea & wt gain .  ABG was 7.40/49/63 on 4 L [facility]. Echo showed EF 50% with mild decrease in systolic function. BNP > 500 & CXR showed a rt pleural effusion. CT angio neg for PE  She was diuresed > 50 lbs, discharged on O2 ([place]) .   PSG in '02 showed severe obstructive sleep apnea with AHI 40/h, lowest desatn to 52% correctd by 8 cm H2O . Her wt was 325 lbs then.  CPAP titration study >> required 10-16cm CPAP, higher pressure when supine/ REM sleep.  Download 6/18- 08/13/10 >>Good control of events on CPAP 13 cm - changed to this pressure.     O2 dc'd 10/12  allegra and nasonex added 4/13 for nasal symptoms but c/o increased asthma attacks after addition incl acute OV in 6/13 - changed regimen to Zyrtec +Chlortrimeton 6/13  Previously seen by allergist, recommended to have allergy vaccines . Declined.  +drug screen in chart review +marajuana 2012.  Says previous dx with asthma has been tx in past with advair , theophylline.  PFTs 6/13 - no obstruction, mild restriction, FVC 78%, DLCO 64%   08/14/2012 12/2011 Download 12/13 -CPAP 13 cm very effective  Good usage  No leak Wt unchanged 368 lbs has chronic pain and goes to the pain clinic for Fibromyalgia. - wonders if zyrtec is making this worse Pt is wearing CPAP machine everynight x 8 hrs or more. Denies any problems w/ mask/machine Pt reports breathing is the same. No wheezing, no chest tx, no cough Has achilles tendinitis - hewitt Lasix dosing per wt - [name] She is having a 'normal day' with her psychiatric symptoms - diaphoretic, wearing sunglasses, tearing, anxious, pressured speech Sees Dr [name] every 8 wks Sees  [name] every wk    Review of Systems neg for any significant sore throat, dysphagia, itching, sneezing, nasal congestion or excess/ purulent secretions, fever, chills, sweats, unintended wt loss, pleuritic or exertional cp, hempoptysis, orthopnea pnd or change in chronic leg swelling. Also denies presyncope, palpitations, heartburn, abdominal pain, nausea, vomiting, diarrhea or change in bowel or urinary habits, dysuria,hematuria, rash, arthralgias, visual complaints, headache, numbness weakness or ataxia.     Objective:   Physical Exam  Gen. Pleasant, obese, anxious affect ENT - no lesions, no post nasal drip Neck: No JVD, no thyromegaly, no carotid bruits Lungs: no use of accessory muscles, no dullness to percussion, decreased without rales or rhonchi  Cardiovascular: Rhythm regular, heart sounds  normal, no murmurs or gallops, no peripheral edema Musculoskeletal: No deformities, no cyanosis or clubbing , no tremors       Assessment & Plan:

## 2012-08-14 NOTE — Assessment & Plan Note (Signed)
claritin instead of zyrtec Doubt 'true asthma'

## 2012-08-18 DIAGNOSIS — M25569 Pain in unspecified knee: Secondary | ICD-10-CM | POA: Diagnosis not present

## 2012-08-18 DIAGNOSIS — M7660 Achilles tendinitis, unspecified leg: Secondary | ICD-10-CM | POA: Diagnosis not present

## 2012-08-18 DIAGNOSIS — M2550 Pain in unspecified joint: Secondary | ICD-10-CM | POA: Diagnosis not present

## 2012-08-19 ENCOUNTER — Ambulatory Visit (INDEPENDENT_AMBULATORY_CARE_PROVIDER_SITE_OTHER): Payer: PRIVATE HEALTH INSURANCE | Admitting: Psychology

## 2012-08-19 DIAGNOSIS — F319 Bipolar disorder, unspecified: Secondary | ICD-10-CM

## 2012-08-22 DIAGNOSIS — M2550 Pain in unspecified joint: Secondary | ICD-10-CM | POA: Diagnosis not present

## 2012-08-25 DIAGNOSIS — G894 Chronic pain syndrome: Secondary | ICD-10-CM | POA: Diagnosis not present

## 2012-08-25 DIAGNOSIS — M47817 Spondylosis without myelopathy or radiculopathy, lumbosacral region: Secondary | ICD-10-CM | POA: Diagnosis not present

## 2012-08-25 DIAGNOSIS — M2550 Pain in unspecified joint: Secondary | ICD-10-CM | POA: Diagnosis not present

## 2012-08-25 DIAGNOSIS — M47812 Spondylosis without myelopathy or radiculopathy, cervical region: Secondary | ICD-10-CM | POA: Diagnosis not present

## 2012-08-25 DIAGNOSIS — Z79899 Other long term (current) drug therapy: Secondary | ICD-10-CM | POA: Diagnosis not present

## 2012-08-26 ENCOUNTER — Ambulatory Visit (INDEPENDENT_AMBULATORY_CARE_PROVIDER_SITE_OTHER): Payer: PRIVATE HEALTH INSURANCE | Admitting: Psychology

## 2012-08-26 DIAGNOSIS — F319 Bipolar disorder, unspecified: Secondary | ICD-10-CM

## 2012-08-27 DIAGNOSIS — M2550 Pain in unspecified joint: Secondary | ICD-10-CM | POA: Diagnosis not present

## 2012-08-29 DIAGNOSIS — M2550 Pain in unspecified joint: Secondary | ICD-10-CM | POA: Diagnosis not present

## 2012-09-01 DIAGNOSIS — E559 Vitamin D deficiency, unspecified: Secondary | ICD-10-CM | POA: Diagnosis not present

## 2012-09-01 DIAGNOSIS — I10 Essential (primary) hypertension: Secondary | ICD-10-CM | POA: Diagnosis not present

## 2012-09-01 DIAGNOSIS — E119 Type 2 diabetes mellitus without complications: Secondary | ICD-10-CM | POA: Diagnosis not present

## 2012-09-01 DIAGNOSIS — E782 Mixed hyperlipidemia: Secondary | ICD-10-CM | POA: Diagnosis not present

## 2012-09-01 DIAGNOSIS — Z79899 Other long term (current) drug therapy: Secondary | ICD-10-CM | POA: Diagnosis not present

## 2012-09-02 ENCOUNTER — Ambulatory Visit (INDEPENDENT_AMBULATORY_CARE_PROVIDER_SITE_OTHER): Payer: PRIVATE HEALTH INSURANCE | Admitting: Psychology

## 2012-09-02 DIAGNOSIS — F319 Bipolar disorder, unspecified: Secondary | ICD-10-CM

## 2012-09-04 DIAGNOSIS — M2550 Pain in unspecified joint: Secondary | ICD-10-CM | POA: Diagnosis not present

## 2012-09-04 DIAGNOSIS — Z79899 Other long term (current) drug therapy: Secondary | ICD-10-CM | POA: Diagnosis not present

## 2012-09-08 DIAGNOSIS — M4712 Other spondylosis with myelopathy, cervical region: Secondary | ICD-10-CM | POA: Diagnosis not present

## 2012-09-09 ENCOUNTER — Ambulatory Visit (INDEPENDENT_AMBULATORY_CARE_PROVIDER_SITE_OTHER): Payer: PRIVATE HEALTH INSURANCE | Admitting: Psychology

## 2012-09-09 DIAGNOSIS — F319 Bipolar disorder, unspecified: Secondary | ICD-10-CM | POA: Diagnosis not present

## 2012-09-09 DIAGNOSIS — M2550 Pain in unspecified joint: Secondary | ICD-10-CM | POA: Diagnosis not present

## 2012-09-12 DIAGNOSIS — M2550 Pain in unspecified joint: Secondary | ICD-10-CM | POA: Diagnosis not present

## 2012-09-15 DIAGNOSIS — M2550 Pain in unspecified joint: Secondary | ICD-10-CM | POA: Diagnosis not present

## 2012-09-16 ENCOUNTER — Ambulatory Visit (INDEPENDENT_AMBULATORY_CARE_PROVIDER_SITE_OTHER): Payer: PRIVATE HEALTH INSURANCE | Admitting: Psychology

## 2012-09-16 DIAGNOSIS — F319 Bipolar disorder, unspecified: Secondary | ICD-10-CM

## 2012-09-18 DIAGNOSIS — M2550 Pain in unspecified joint: Secondary | ICD-10-CM | POA: Diagnosis not present

## 2012-09-22 DIAGNOSIS — Z79899 Other long term (current) drug therapy: Secondary | ICD-10-CM | POA: Diagnosis not present

## 2012-09-22 DIAGNOSIS — G894 Chronic pain syndrome: Secondary | ICD-10-CM | POA: Diagnosis not present

## 2012-09-22 DIAGNOSIS — IMO0002 Reserved for concepts with insufficient information to code with codable children: Secondary | ICD-10-CM | POA: Diagnosis not present

## 2012-09-22 DIAGNOSIS — M5030 Other cervical disc degeneration, unspecified cervical region: Secondary | ICD-10-CM | POA: Diagnosis not present

## 2012-09-23 ENCOUNTER — Ambulatory Visit (INDEPENDENT_AMBULATORY_CARE_PROVIDER_SITE_OTHER): Payer: PRIVATE HEALTH INSURANCE | Admitting: Psychology

## 2012-09-23 DIAGNOSIS — F319 Bipolar disorder, unspecified: Secondary | ICD-10-CM

## 2012-09-25 ENCOUNTER — Other Ambulatory Visit: Payer: Self-pay | Admitting: Cardiology

## 2012-09-30 ENCOUNTER — Ambulatory Visit (INDEPENDENT_AMBULATORY_CARE_PROVIDER_SITE_OTHER): Payer: PRIVATE HEALTH INSURANCE | Admitting: Psychology

## 2012-09-30 DIAGNOSIS — F319 Bipolar disorder, unspecified: Secondary | ICD-10-CM

## 2012-10-15 ENCOUNTER — Ambulatory Visit (INDEPENDENT_AMBULATORY_CARE_PROVIDER_SITE_OTHER): Payer: PRIVATE HEALTH INSURANCE | Admitting: Psychology

## 2012-10-15 DIAGNOSIS — F319 Bipolar disorder, unspecified: Secondary | ICD-10-CM

## 2012-10-21 ENCOUNTER — Ambulatory Visit (INDEPENDENT_AMBULATORY_CARE_PROVIDER_SITE_OTHER): Payer: PRIVATE HEALTH INSURANCE | Admitting: Psychology

## 2012-10-21 ENCOUNTER — Other Ambulatory Visit: Payer: Self-pay | Admitting: Physician Assistant

## 2012-10-21 DIAGNOSIS — F319 Bipolar disorder, unspecified: Secondary | ICD-10-CM

## 2012-10-21 DIAGNOSIS — M5134 Other intervertebral disc degeneration, thoracic region: Secondary | ICD-10-CM

## 2012-10-23 ENCOUNTER — Ambulatory Visit: Payer: Self-pay | Admitting: Cardiology

## 2012-10-28 ENCOUNTER — Ambulatory Visit (INDEPENDENT_AMBULATORY_CARE_PROVIDER_SITE_OTHER): Payer: PRIVATE HEALTH INSURANCE | Admitting: Psychology

## 2012-10-28 DIAGNOSIS — F319 Bipolar disorder, unspecified: Secondary | ICD-10-CM

## 2012-11-04 ENCOUNTER — Ambulatory Visit (INDEPENDENT_AMBULATORY_CARE_PROVIDER_SITE_OTHER): Payer: PRIVATE HEALTH INSURANCE | Admitting: Psychology

## 2012-11-04 DIAGNOSIS — F319 Bipolar disorder, unspecified: Secondary | ICD-10-CM

## 2012-11-05 DIAGNOSIS — M546 Pain in thoracic spine: Secondary | ICD-10-CM | POA: Diagnosis not present

## 2012-11-11 ENCOUNTER — Ambulatory Visit (INDEPENDENT_AMBULATORY_CARE_PROVIDER_SITE_OTHER): Payer: PRIVATE HEALTH INSURANCE | Admitting: Psychology

## 2012-11-11 DIAGNOSIS — F319 Bipolar disorder, unspecified: Secondary | ICD-10-CM

## 2012-11-12 DIAGNOSIS — M546 Pain in thoracic spine: Secondary | ICD-10-CM | POA: Diagnosis not present

## 2012-11-14 ENCOUNTER — Encounter: Payer: Self-pay | Admitting: Physician Assistant

## 2012-11-14 ENCOUNTER — Encounter: Payer: Self-pay | Admitting: Internal Medicine

## 2012-11-14 DIAGNOSIS — I5022 Chronic systolic (congestive) heart failure: Secondary | ICD-10-CM

## 2012-11-14 DIAGNOSIS — R7303 Prediabetes: Secondary | ICD-10-CM

## 2012-11-14 DIAGNOSIS — F319 Bipolar disorder, unspecified: Secondary | ICD-10-CM

## 2012-11-14 DIAGNOSIS — F32A Depression, unspecified: Secondary | ICD-10-CM | POA: Insufficient documentation

## 2012-11-14 DIAGNOSIS — E785 Hyperlipidemia, unspecified: Secondary | ICD-10-CM | POA: Insufficient documentation

## 2012-11-14 DIAGNOSIS — F419 Anxiety disorder, unspecified: Secondary | ICD-10-CM | POA: Insufficient documentation

## 2012-11-14 DIAGNOSIS — I10 Essential (primary) hypertension: Secondary | ICD-10-CM | POA: Insufficient documentation

## 2012-11-14 DIAGNOSIS — F329 Major depressive disorder, single episode, unspecified: Secondary | ICD-10-CM | POA: Insufficient documentation

## 2012-11-18 ENCOUNTER — Ambulatory Visit (INDEPENDENT_AMBULATORY_CARE_PROVIDER_SITE_OTHER): Payer: 59 | Admitting: Psychology

## 2012-11-18 ENCOUNTER — Ambulatory Visit: Payer: Self-pay | Admitting: Cardiology

## 2012-11-18 DIAGNOSIS — M546 Pain in thoracic spine: Secondary | ICD-10-CM | POA: Diagnosis not present

## 2012-11-18 DIAGNOSIS — F319 Bipolar disorder, unspecified: Secondary | ICD-10-CM

## 2012-11-20 DIAGNOSIS — M546 Pain in thoracic spine: Secondary | ICD-10-CM | POA: Diagnosis not present

## 2012-11-25 ENCOUNTER — Ambulatory Visit (INDEPENDENT_AMBULATORY_CARE_PROVIDER_SITE_OTHER): Payer: PRIVATE HEALTH INSURANCE | Admitting: Psychology

## 2012-11-25 DIAGNOSIS — F319 Bipolar disorder, unspecified: Secondary | ICD-10-CM

## 2012-11-26 DIAGNOSIS — M546 Pain in thoracic spine: Secondary | ICD-10-CM | POA: Diagnosis not present

## 2012-12-01 DIAGNOSIS — M546 Pain in thoracic spine: Secondary | ICD-10-CM | POA: Diagnosis not present

## 2012-12-02 ENCOUNTER — Ambulatory Visit (INDEPENDENT_AMBULATORY_CARE_PROVIDER_SITE_OTHER): Payer: PRIVATE HEALTH INSURANCE | Admitting: Psychology

## 2012-12-02 ENCOUNTER — Ambulatory Visit: Payer: Self-pay | Admitting: Physician Assistant

## 2012-12-02 DIAGNOSIS — F319 Bipolar disorder, unspecified: Secondary | ICD-10-CM

## 2012-12-02 DIAGNOSIS — M546 Pain in thoracic spine: Secondary | ICD-10-CM | POA: Diagnosis not present

## 2012-12-04 DIAGNOSIS — M546 Pain in thoracic spine: Secondary | ICD-10-CM | POA: Diagnosis not present

## 2012-12-09 ENCOUNTER — Ambulatory Visit (INDEPENDENT_AMBULATORY_CARE_PROVIDER_SITE_OTHER): Payer: PRIVATE HEALTH INSURANCE | Admitting: Psychology

## 2012-12-09 DIAGNOSIS — M546 Pain in thoracic spine: Secondary | ICD-10-CM | POA: Diagnosis not present

## 2012-12-09 DIAGNOSIS — F319 Bipolar disorder, unspecified: Secondary | ICD-10-CM

## 2012-12-11 ENCOUNTER — Other Ambulatory Visit: Payer: Self-pay | Admitting: Orthopedic Surgery

## 2012-12-11 DIAGNOSIS — M546 Pain in thoracic spine: Secondary | ICD-10-CM | POA: Diagnosis not present

## 2012-12-16 ENCOUNTER — Ambulatory Visit (INDEPENDENT_AMBULATORY_CARE_PROVIDER_SITE_OTHER): Payer: PRIVATE HEALTH INSURANCE | Admitting: Psychology

## 2012-12-16 ENCOUNTER — Ambulatory Visit: Payer: PRIVATE HEALTH INSURANCE | Admitting: Psychology

## 2012-12-16 DIAGNOSIS — F319 Bipolar disorder, unspecified: Secondary | ICD-10-CM

## 2012-12-16 DIAGNOSIS — M546 Pain in thoracic spine: Secondary | ICD-10-CM | POA: Diagnosis not present

## 2012-12-18 DIAGNOSIS — M546 Pain in thoracic spine: Secondary | ICD-10-CM | POA: Diagnosis not present

## 2012-12-22 ENCOUNTER — Other Ambulatory Visit: Payer: Self-pay | Admitting: Cardiology

## 2012-12-23 ENCOUNTER — Ambulatory Visit (INDEPENDENT_AMBULATORY_CARE_PROVIDER_SITE_OTHER): Payer: PRIVATE HEALTH INSURANCE | Admitting: Psychology

## 2012-12-23 DIAGNOSIS — F319 Bipolar disorder, unspecified: Secondary | ICD-10-CM

## 2012-12-24 ENCOUNTER — Encounter (HOSPITAL_BASED_OUTPATIENT_CLINIC_OR_DEPARTMENT_OTHER): Payer: Self-pay | Admitting: *Deleted

## 2012-12-24 NOTE — Progress Notes (Signed)
Pt was ok for here 8/14-did well-will use cpap post op-she will come in for bmet-

## 2012-12-29 ENCOUNTER — Encounter (HOSPITAL_BASED_OUTPATIENT_CLINIC_OR_DEPARTMENT_OTHER)
Admission: RE | Admit: 2012-12-29 | Discharge: 2012-12-29 | Disposition: A | Discharge status: Home / Self Care | Payer: 59 | Source: Ambulatory Visit | Attending: Orthopedic Surgery | Admitting: Orthopedic Surgery

## 2012-12-29 LAB — BASIC METABOLIC PANEL
BUN: 11 mg/dL (ref 6–23)
CO2: 27 mEq/L (ref 19–32)
Calcium: 9.4 mg/dL (ref 8.4–10.5)
Chloride: 99 mEq/L (ref 96–112)
Creatinine, Ser: 0.58 mg/dL (ref 0.50–1.10)
Potassium: 3.7 mEq/L (ref 3.5–5.1)

## 2012-12-30 ENCOUNTER — Ambulatory Visit (INDEPENDENT_AMBULATORY_CARE_PROVIDER_SITE_OTHER): Payer: PRIVATE HEALTH INSURANCE | Admitting: Psychology

## 2012-12-30 DIAGNOSIS — F319 Bipolar disorder, unspecified: Secondary | ICD-10-CM

## 2012-12-31 ENCOUNTER — Encounter (HOSPITAL_BASED_OUTPATIENT_CLINIC_OR_DEPARTMENT_OTHER): Payer: 59 | Admitting: Certified Registered"

## 2012-12-31 ENCOUNTER — Encounter (HOSPITAL_BASED_OUTPATIENT_CLINIC_OR_DEPARTMENT_OTHER): Payer: Self-pay | Admitting: *Deleted

## 2012-12-31 ENCOUNTER — Encounter (HOSPITAL_BASED_OUTPATIENT_CLINIC_OR_DEPARTMENT_OTHER)
Admission: RE | Disposition: A | Discharge status: Home / Self Care | Payer: Self-pay | Source: Ambulatory Visit | Attending: Orthopedic Surgery

## 2012-12-31 ENCOUNTER — Ambulatory Visit (HOSPITAL_BASED_OUTPATIENT_CLINIC_OR_DEPARTMENT_OTHER)
Admission: RE | Admit: 2012-12-31 | Discharge: 2012-12-31 | Disposition: A | Discharge status: Home / Self Care | Payer: 59 | Source: Ambulatory Visit | Attending: Orthopedic Surgery | Admitting: Orthopedic Surgery

## 2012-12-31 ENCOUNTER — Ambulatory Visit (HOSPITAL_BASED_OUTPATIENT_CLINIC_OR_DEPARTMENT_OTHER): Payer: 59 | Admitting: Certified Registered"

## 2012-12-31 DIAGNOSIS — M65839 Other synovitis and tenosynovitis, unspecified forearm: Secondary | ICD-10-CM | POA: Insufficient documentation

## 2012-12-31 DIAGNOSIS — I509 Heart failure, unspecified: Secondary | ICD-10-CM | POA: Insufficient documentation

## 2012-12-31 DIAGNOSIS — I10 Essential (primary) hypertension: Secondary | ICD-10-CM | POA: Diagnosis not present

## 2012-12-31 DIAGNOSIS — G4733 Obstructive sleep apnea (adult) (pediatric): Secondary | ICD-10-CM | POA: Diagnosis not present

## 2012-12-31 DIAGNOSIS — E785 Hyperlipidemia, unspecified: Secondary | ICD-10-CM | POA: Insufficient documentation

## 2012-12-31 DIAGNOSIS — I5022 Chronic systolic (congestive) heart failure: Secondary | ICD-10-CM | POA: Insufficient documentation

## 2012-12-31 DIAGNOSIS — M6530 Trigger finger, unspecified finger: Secondary | ICD-10-CM | POA: Insufficient documentation

## 2012-12-31 DIAGNOSIS — J449 Chronic obstructive pulmonary disease, unspecified: Secondary | ICD-10-CM | POA: Diagnosis not present

## 2012-12-31 HISTORY — PX: TRIGGER FINGER RELEASE: SHX641

## 2012-12-31 LAB — GLUCOSE, CAPILLARY
Glucose-Capillary: 129 mg/dL — ABNORMAL HIGH (ref 70–99)
Glucose-Capillary: 132 mg/dL — ABNORMAL HIGH (ref 70–99)

## 2012-12-31 SURGERY — RELEASE, A1 PULLEY, FOR TRIGGER FINGER
Anesthesia: General | Site: Hand | Laterality: Left

## 2012-12-31 MED ORDER — HYDROMORPHONE HCL PF 1 MG/ML IJ SOLN
INTRAMUSCULAR | Status: AC
  Filled 2012-12-31: qty 1

## 2012-12-31 MED ORDER — KETOROLAC TROMETHAMINE 30 MG/ML IJ SOLN
INTRAMUSCULAR | Status: AC
  Filled 2012-12-31: qty 1

## 2012-12-31 MED ORDER — LACTATED RINGERS IV SOLN
INTRAVENOUS | Status: DC
  Administered 2012-12-31: 11:00:00 via INTRAVENOUS

## 2012-12-31 MED ORDER — ONDANSETRON HCL 4 MG/2ML IJ SOLN: 4.0000 mg | Freq: Once | INTRAMUSCULAR | Status: DC | PRN

## 2012-12-31 MED ORDER — OXYCODONE HCL 5 MG/5ML PO SOLN: 5.0000 mg | Freq: Once | ORAL | Status: AC | PRN

## 2012-12-31 MED ORDER — MEPERIDINE HCL 25 MG/ML IJ SOLN: 6.2500 mg | INTRAMUSCULAR | Status: DC | PRN

## 2012-12-31 MED ORDER — CHLORHEXIDINE GLUCONATE 4 % EX LIQD: 60.0000 mL | Freq: Once | CUTANEOUS | Status: DC

## 2012-12-31 MED ORDER — FENTANYL CITRATE 0.05 MG/ML IJ SOLN: 50.0000 ug | INTRAMUSCULAR | Status: DC | PRN

## 2012-12-31 MED ORDER — MIDAZOLAM HCL 5 MG/5ML IJ SOLN
INTRAMUSCULAR | Status: DC | PRN
  Administered 2012-12-31: 1 mg via INTRAVENOUS

## 2012-12-31 MED ORDER — MIDAZOLAM HCL 2 MG/2ML IJ SOLN
INTRAMUSCULAR | Status: AC
  Filled 2012-12-31: qty 2

## 2012-12-31 MED ORDER — CEFAZOLIN SODIUM-DEXTROSE 2-3 GM-% IV SOLR: 2.0000 g | INTRAVENOUS | Status: DC

## 2012-12-31 MED ORDER — DEXTROSE 5 % IV SOLN
3.0000 g | INTRAVENOUS | Status: AC
  Administered 2012-12-31: 3 g via INTRAVENOUS

## 2012-12-31 MED ORDER — MIDAZOLAM HCL 2 MG/2ML IJ SOLN: 1.0000 mg | INTRAMUSCULAR | Status: DC | PRN

## 2012-12-31 MED ORDER — SUCCINYLCHOLINE CHLORIDE 20 MG/ML IJ SOLN
INTRAMUSCULAR | Status: DC | PRN
  Administered 2012-12-31: 60 mg via INTRAVENOUS

## 2012-12-31 MED ORDER — BUPIVACAINE HCL (PF) 0.25 % IJ SOLN
INTRAMUSCULAR | Status: DC | PRN
  Administered 2012-12-31: 3 mL

## 2012-12-31 MED ORDER — BUPIVACAINE HCL (PF) 0.25 % IJ SOLN
INTRAMUSCULAR | Status: AC
  Filled 2012-12-31: qty 30

## 2012-12-31 MED ORDER — HYDROCODONE-ACETAMINOPHEN 5-325 MG PO TABS: 1.0000 | ORAL_TABLET | Freq: Four times a day (QID) | ORAL | Status: DC | PRN

## 2012-12-31 MED ORDER — 0.9 % SODIUM CHLORIDE (POUR BTL) OPTIME
TOPICAL | Status: DC | PRN
  Administered 2012-12-31: 200 mL

## 2012-12-31 MED ORDER — HYDROMORPHONE HCL PF 1 MG/ML IJ SOLN
0.2500 mg | INTRAMUSCULAR | Status: DC | PRN
  Administered 2012-12-31 (×4): 0.5 mg via INTRAVENOUS

## 2012-12-31 MED ORDER — KETOROLAC TROMETHAMINE 30 MG/ML IJ SOLN
30.0000 mg | Freq: Once | INTRAMUSCULAR | Status: AC
  Administered 2012-12-31: 30 mg via INTRAVENOUS

## 2012-12-31 MED ORDER — OXYCODONE HCL 5 MG PO TABS
5.0000 mg | ORAL_TABLET | Freq: Once | ORAL | Status: AC | PRN
  Administered 2012-12-31: 5 mg via ORAL

## 2012-12-31 MED ORDER — FENTANYL CITRATE 0.05 MG/ML IJ SOLN
INTRAMUSCULAR | Status: DC | PRN
  Administered 2012-12-31: 50 ug via INTRAVENOUS

## 2012-12-31 MED ORDER — PROPOFOL 10 MG/ML IV BOLUS
INTRAVENOUS | Status: DC | PRN
  Administered 2012-12-31: 200 mg via INTRAVENOUS
  Administered 2012-12-31 (×2): 100 mg via INTRAVENOUS

## 2012-12-31 MED ORDER — LIDOCAINE HCL (CARDIAC) 20 MG/ML IV SOLN
INTRAVENOUS | Status: DC | PRN
  Administered 2012-12-31: 100 mg via INTRAVENOUS

## 2012-12-31 MED ORDER — FENTANYL CITRATE 0.05 MG/ML IJ SOLN
INTRAMUSCULAR | Status: AC
  Filled 2012-12-31: qty 4

## 2012-12-31 MED ORDER — OXYCODONE HCL 5 MG PO TABS
ORAL_TABLET | ORAL | Status: AC
  Filled 2012-12-31: qty 1

## 2012-12-31 SURGICAL SUPPLY — 35 items
BANDAGE COBAN STERILE 2 (GAUZE/BANDAGES/DRESSINGS) ×2 IMPLANT
BLADE SURG 15 STRL LF DISP TIS (BLADE) ×1 IMPLANT
BLADE SURG 15 STRL SS (BLADE) ×1
BNDG ESMARK 4X9 LF (GAUZE/BANDAGES/DRESSINGS) ×2 IMPLANT
CHLORAPREP W/TINT 26ML (MISCELLANEOUS) ×2 IMPLANT
CORDS BIPOLAR (ELECTRODE) IMPLANT
COVER MAYO STAND STRL (DRAPES) ×2 IMPLANT
COVER TABLE BACK 60X90 (DRAPES) ×2 IMPLANT
CUFF TOURNIQUET SINGLE 18IN (TOURNIQUET CUFF) IMPLANT
CUFF TOURNIQUET SINGLE 24IN (TOURNIQUET CUFF) ×2 IMPLANT
DECANTER SPIKE VIAL GLASS SM (MISCELLANEOUS) IMPLANT
DRAPE EXTREMITY T 121X128X90 (DRAPE) ×2 IMPLANT
DRAPE SURG 17X23 STRL (DRAPES) ×2 IMPLANT
GAUZE XEROFORM 1X8 LF (GAUZE/BANDAGES/DRESSINGS) ×2 IMPLANT
GLOVE BIOGEL PI IND STRL 7.0 (GLOVE) ×1 IMPLANT
GLOVE BIOGEL PI IND STRL 8.5 (GLOVE) ×1 IMPLANT
GLOVE BIOGEL PI INDICATOR 7.0 (GLOVE) ×1
GLOVE BIOGEL PI INDICATOR 8.5 (GLOVE) ×1
GLOVE ECLIPSE 6.5 STRL STRAW (GLOVE) ×2 IMPLANT
GLOVE EXAM NITRILE LRG STRL (GLOVE) ×2 IMPLANT
GLOVE SURG ORTHO 8.0 STRL STRW (GLOVE) ×2 IMPLANT
GOWN BRE IMP PREV XXLGXLNG (GOWN DISPOSABLE) ×2 IMPLANT
GOWN PREVENTION PLUS XLARGE (GOWN DISPOSABLE) ×2 IMPLANT
NEEDLE 27GAX1X1/2 (NEEDLE) ×2 IMPLANT
NS IRRIG 1000ML POUR BTL (IV SOLUTION) ×2 IMPLANT
PACK BASIN DAY SURGERY FS (CUSTOM PROCEDURE TRAY) ×2 IMPLANT
PADDING CAST ABS 4INX4YD NS (CAST SUPPLIES)
PADDING CAST ABS COTTON 4X4 ST (CAST SUPPLIES) IMPLANT
SPONGE GAUZE 4X4 12PLY (GAUZE/BANDAGES/DRESSINGS) ×2 IMPLANT
STOCKINETTE 4X48 STRL (DRAPES) ×2 IMPLANT
SUT VICRYL RAPIDE 4/0 PS 2 (SUTURE) ×2 IMPLANT
SYR BULB 3OZ (MISCELLANEOUS) ×2 IMPLANT
SYR CONTROL 10ML LL (SYRINGE) ×2 IMPLANT
TOWEL OR 17X24 6PK STRL BLUE (TOWEL DISPOSABLE) ×4 IMPLANT
UNDERPAD 30X30 INCONTINENT (UNDERPADS AND DIAPERS) ×2 IMPLANT

## 2012-12-31 NOTE — Anesthesia Procedure Notes (Signed)
Procedure Name: Intubation Date/Time: 12/31/2012 11:14 AM Performed by: Caren Macadam Pre-anesthesia Checklist: Patient identified, Emergency Drugs available, Suction available and Patient being monitored Patient Re-evaluated:Patient Re-evaluated prior to inductionOxygen Delivery Method: Circle System Utilized Preoxygenation: Pre-oxygenation with 100% oxygen Intubation Type: IV induction Ventilation: Mask ventilation without difficulty Laryngoscope Size: Miller and 2 Grade View: Grade II Tube type: Oral Tube size: 7.0 mm Number of attempts: 1 Airway Equipment and Method: stylet and oral airway Placement Confirmation: ETT inserted through vocal cords under direct vision,  positive ETCO2 and breath sounds checked- equal and bilateral Secured at: 22 cm Tube secured with: Tape Dental Injury: Teeth and Oropharynx as per pre-operative assessment  Comments: Attempted to place Supreme LMA #4 and #5. Unable to provide adequate TV, placed ETT. BBS, sats good.

## 2012-12-31 NOTE — Anesthesia Preprocedure Evaluation (Signed)
Anesthesia Evaluation  Patient identified by MRN, date of birth, ID band Patient awake    Reviewed: Allergy & Precautions, H&P , NPO status , Patient's Chart, lab work & pertinent test results  Airway Mallampati: II TM Distance: >3 FB Neck ROM: Full    Dental   Pulmonary asthma , sleep apnea , COPD         Cardiovascular hypertension, Pt. on medications     Neuro/Psych Seizures -,  Anxiety Depression    GI/Hepatic   Endo/Other    Renal/GU      Musculoskeletal   Abdominal   Peds  Hematology   Anesthesia Other Findings   Reproductive/Obstetrics                           Anesthesia Physical Anesthesia Plan  ASA: III  Anesthesia Plan: General   Post-op Pain Management:    Induction: Intravenous  Airway Management Planned: LMA  Additional Equipment:   Intra-op Plan:   Post-operative Plan: Extubation in OR  Informed Consent: I have reviewed the patients History and Physical, chart, labs and discussed the procedure including the risks, benefits and alternatives for the proposed anesthesia with the patient or authorized representative who has indicated his/her understanding and acceptance.     Plan Discussed with: CRNA and Surgeon  Anesthesia Plan Comments:         Anesthesia Quick Evaluation

## 2012-12-31 NOTE — H&P (Signed)
Neta Mends is a 42 yo female post cervical laminectomy 2-3-4 by Dr. Venetia Maxon. This has gone well. He has cleared her to go ahead and have her right carpal tunnel release and STS release right side. Her nerve conductions are positive. This has not responded to conservative treatment. She continues to complain of constant numbness and tingling. Her left carpal tunnel release has done well.  She has triggering of her left thumb. She has been injected on two occasions.  She is desirous of proceeding to have this surgical released having had multiple releases in the past.   PAST MEDICAL HISTORY: She has no known drug allergies. She is on Zoloft, Lamictal, Xanax, Klonopin, Lasix, potassium, Tramadol, Losartan and Ritalin. She has had wisdom teeth removal and cervical laminectomy, carpal tunnel release, and mass excision.  FAMILY H ISTORY: Positive for arthritis, otherwise negative.  SOCIAL HISTORY: She does not smoke or drink. She is married.  REVIEW OF SYSTEMS: Positive for glasses, contacts, asthma, pneumonia, shortness of breath, sleep disorder, depression, nervousness, otherwise negative. MONAY HOULTON is an 42 y.o. female.   Chief Complaint:  HPI: sts left thumb  Past Medical History  Diagnosis Date  . Morbid obesity   . Obesity hypoventilation syndrome   . Bipolar disorder   . Hypoxemia     history of - no home O2  . Cor pulmonale   . History of pleural effusion   . Spinal stenosis in cervical region   . Acute systolic congestive heart failure   . Seasonal allergies     current runny nose  . Arthritis     osteoarthritis bilal. knees  . Urinary incontinence   . Abrasion of breast 06/21/2011    left  . Sleep apnea sleep study 07/02/2010    uses CPAP nightly  . Shortness of breath     with ADLs  . Asthma     no per PFT 7/13  . Seizures     febrile seizure x 1 as a child. Several times  . IBS (irritable bowel syndrome)   . Carpal tunnel syndrome of left wrist 05/2011    Being  evaluated for MS  . Prediabetes   . Agoraphobia   . Hyperlipidemia   . Hypertension   . Depression   . Anxiety     panic attacks  . Obstructive lung disease     Past Surgical History  Procedure Laterality Date  . Wisdom tooth extraction    . Mass excision  08/07/2010    right index  . Carpal tunnel release  06/27/2011    Procedure: CARPAL TUNNEL RELEASE;  Surgeon: Nicki Reaper, MD;  Location: [facility];  Service: Orthopedics;  Laterality: Left;  . Posterior cervical fusion/foraminotomy  09/11/2011    Procedure: POSTERIOR CERVICAL FUSION/FORAMINOTOMY LEVEL 1;  Surgeon: Maeola Harman, MD;  Location: [facility];  Service: Neurosurgery;  Laterality: N/A;  Cervical Three-Four Posteior Cervical Fusion and Decompression.  . Carpal tunnel release  12/19/2011    Procedure: CARPAL TUNNEL RELEASE;  Surgeon: Nicki Reaper, MD;  Location: [facility];  Service: Orthopedics;  Laterality: Right;  . Trigger finger release  12/19/2011    Procedure: RELEASE TRIGGER FINGER/A-1 PULLEY;  Surgeon: Nicki Reaper, MD;  Location: [facility];  Service: Orthopedics;  Laterality: Right;    Family History  Problem Relation Age of Onset  . Asthma Mother   . Allergic rhinitis Mother   . Multiple sclerosis Mother   . Asthma  Maternal Grandmother    Social History:  reports that she has never smoked. She has never used smokeless tobacco. She reports that she drinks alcohol. She reports that she does not use illicit drugs.  Allergies:  Allergies  Allergen Reactions  . Savella [Milnacipran Hcl]     mania    No prescriptions prior to admission    Results for orders placed during the hospital encounter of 12/31/12 (from the past 48 hour(s))  BASIC METABOLIC PANEL     Status: Abnormal   Collection Time    12/29/12  4:00 PM      Result Value Range   Sodium 140  135 - 145 mEq/L   Potassium 3.7  3.5 - 5.1 mEq/L   Chloride 99  96 - 112 mEq/L   CO2 27  19 - 32 mEq/L    Glucose, Bld 113 (*) 70 - 99 mg/dL   BUN 11  6 - 23 mg/dL   Creatinine, Ser 8.41  0.50 - 1.10 mg/dL   Calcium 9.4  8.4 - 32.4 mg/dL   GFR calc non Af Amer >90  >90 mL/min   GFR calc Af Amer >90  >90 mL/min   Comment: (NOTE)     The eGFR has been calculated using the CKD EPI equation.     This calculation has not been validated in all clinical situations.     eGFR's persistently <90 mL/min signify possible Chronic Kidney     Disease.    No results found.   Pertinent items are noted in HPI.  Height 5\' 5"  (1.651 m), weight 366 lb (166.017 kg).  General appearance: alert, cooperative and appears stated age Head: Normocephalic, without obvious abnormality Neck: no JVD Resp: clear to auscultation bilaterally Cardio: regular rate and rhythm, S1, S2 normal, no murmur, click, rub or gallop GI: soft, non-tender; bowel sounds normal; no masses,  no organomegaly Extremities: extremities normal, atraumatic, no cyanosis or edema Pulses: 2+ and symmetric Skin: Skin color, texture, turgor normal. No rashes or lesions Neurologic: Grossly normal Incision/Wound: na  Assessment/Plan  STS left thumb.  She is desirous of proceeding to have this surgical released having had multiple releases in the past. She is scheduled for release A-1 pulley left thumb as an outpatient under regional anesthesia. Rolande Moe R 12/31/2012, 8:56 AM

## 2012-12-31 NOTE — Transfer of Care (Signed)
Immediate Anesthesia Transfer of Care Note  Patient: [name]  Procedure(s) Performed: Procedure(s): RELEASE A-1 PULLEY LEFT THUMB (Left)  Patient Location: PACU  Anesthesia Type:General  Level of Consciousness: awake, alert , oriented and patient cooperative  Airway & Oxygen Therapy: Patient Spontanous Breathing and Patient connected to face mask oxygen  Post-op Assessment: Report given to PACU RN, Post -op Vital signs reviewed and stable and Patient moving all extremities  Post vital signs: Reviewed and stable  Complications: No apparent anesthesia complications

## 2012-12-31 NOTE — Brief Op Note (Signed)
12/31/2012  11:34 AM  PATIENT:  [name]  42 y.o. female  PRE-OPERATIVE DIAGNOSIS:  Stenosing Tenosynovitis Left Thumb  POST-OPERATIVE DIAGNOSIS:  Stenosing Tenosynovitis Left Thumb  PROCEDURE:  Procedure(s): RELEASE A-1 PULLEY LEFT THUMB (Left)  SURGEON:  Surgeon(s) and Role:    * Nicki Reaper, MD - Primary  PHYSICIAN ASSISTANT:   ASSISTANTS: none   ANESTHESIA:   local and general  EBL:  Total I/O In: 400 [I.V.:400] Out: -   BLOOD ADMINISTERED:none  DRAINS: none   LOCAL MEDICATIONS USED:  BUPIVICAINE   SPECIMEN:  No Specimen  DISPOSITION OF SPECIMEN:  N/A  COUNTS:  YES  TOURNIQUET:   Total Tourniquet Time Documented: Forearm (Left) - 9 minutes Total: Forearm (Left) - 9 minutes   DICTATION: .Other Dictation: Dictation Number [id number]  PLAN OF CARE: Discharge to home after PACU  PATIENT DISPOSITION:  PACU - hemodynamically stable.

## 2012-12-31 NOTE — Anesthesia Postprocedure Evaluation (Signed)
Anesthesia Post Note  Patient: [name]  Procedure(s) Performed: Procedure(s) (LRB): RELEASE A-1 PULLEY LEFT THUMB (Left)  Anesthesia type: general  Patient location: PACU  Post pain: Pain level controlled  Post assessment: Patient's Cardiovascular Status Stable  Last Vitals:  Filed Vitals:   12/31/12 1319  BP: 133/88  Pulse: 88  Temp: 36.7 C  Resp: 16    Post vital signs: Reviewed and stable  Level of consciousness: sedated  Complications: No apparent anesthesia complications

## 2012-12-31 NOTE — Op Note (Signed)
Dictation Number [id number]

## 2013-01-01 NOTE — Op Note (Signed)
[name], [name] NO.:  [id number]  MEDICAL RECORD NO.:  [id number]  LOCATION:                                 FACILITY:  PHYSICIAN:  Cindee Salt, M.D.            DATE OF BIRTH:  DATE OF PROCEDURE:  12/31/2012 DATE OF DISCHARGE:                              OPERATIVE REPORT   PREOPERATIVE DIAGNOSIS:  Stenosing tenosynovitis, left thumb.  POSTOPERATIVE DIAGNOSIS:  Stenosing tenosynovitis, left thumb.  OPERATION:  Release A1 pulley, left thumb.  SURGEON:  Cindee Salt, MD  ANESTHESIA:  General with local infiltration.  ANESTHESIOLOGIST:  Kaylyn Layer. Michelle [name], MD  HISTORY:  The patient is a 42 year old female with a history of triggering of her left thumb.  This has not responded to conservative treatment.  She has elected to undergo surgical release.  She has had release of other trigger fingers along with carpal tunnel releases. Pre, peri, and postoperative course have been discussed along with risks and complications.  She is aware that there is no guarantee with the surgery; possibility of infection; recurrence of injury to arteries, nerves, tendons, incomplete relief of symptoms and dystrophy.  In the preoperative area, the patient is seen, the extremity marked by both the patient and surgeon.  Antibiotic given.  PROCEDURE IN DETAIL:  The patient was brought to the operating room where general anesthetic was carried out without difficulty.  She was prepped using ChloraPrep, supine position with the left arm free.  A 3- minute dry time was allowed.  A time-out taken confirming patient and procedure.  The limb was exsanguinated with an Esmarch bandage. Tourniquet placed high and the forearm was inflated to 250 mmHg.  A transverse incision was made over the A1 pulley of the left thumb, carried down through subcutaneous tissue.  The neurovascular structures radially and ulnarly were identified.  The A1 pulley was identified. This was found to be thickened.   This was incised on its ulnar aspect taking care to protect the oblique pulley.  A partial synovectomy was performed proximally with blunt dissection.  The thumb placed through a full range of motion.  Area of compression to the tendon was immediately apparent.  No further triggering was noted.  The wound was irrigated, and the skin closed with interrupted 4-0 Vicryl Rapide sutures.  Local infiltration with 0.25% Marcaine without epinephrine was given, approximately 5 mL was used.  A sterile compressive dressing was applied.  On deflation of the tourniquet, all fingers immediately pinked.  She was taken to the recovery room for observation in satisfactory condition.  She will be discharged home to return in 1 week on Norco.          ______________________________ Cindee Salt, M.D.     GK/MEDQ  D:  12/31/2012  T:  01/01/2013  Job:  [id number]

## 2013-01-05 ENCOUNTER — Encounter (HOSPITAL_BASED_OUTPATIENT_CLINIC_OR_DEPARTMENT_OTHER): Payer: Self-pay | Admitting: Orthopedic Surgery

## 2013-01-06 ENCOUNTER — Ambulatory Visit: Payer: PRIVATE HEALTH INSURANCE | Admitting: Psychology

## 2013-01-13 ENCOUNTER — Ambulatory Visit (INDEPENDENT_AMBULATORY_CARE_PROVIDER_SITE_OTHER): Payer: PRIVATE HEALTH INSURANCE | Admitting: Psychology

## 2013-01-13 DIAGNOSIS — F319 Bipolar disorder, unspecified: Secondary | ICD-10-CM

## 2013-01-20 ENCOUNTER — Ambulatory Visit (INDEPENDENT_AMBULATORY_CARE_PROVIDER_SITE_OTHER): Payer: PRIVATE HEALTH INSURANCE | Admitting: Psychology

## 2013-01-20 DIAGNOSIS — F319 Bipolar disorder, unspecified: Secondary | ICD-10-CM

## 2013-01-27 ENCOUNTER — Ambulatory Visit (INDEPENDENT_AMBULATORY_CARE_PROVIDER_SITE_OTHER): Payer: PRIVATE HEALTH INSURANCE | Admitting: Psychology

## 2013-01-27 DIAGNOSIS — F319 Bipolar disorder, unspecified: Secondary | ICD-10-CM

## 2013-02-03 ENCOUNTER — Ambulatory Visit (INDEPENDENT_AMBULATORY_CARE_PROVIDER_SITE_OTHER): Payer: PRIVATE HEALTH INSURANCE | Admitting: Psychology

## 2013-02-03 DIAGNOSIS — F319 Bipolar disorder, unspecified: Secondary | ICD-10-CM | POA: Diagnosis not present

## 2013-02-10 ENCOUNTER — Ambulatory Visit (INDEPENDENT_AMBULATORY_CARE_PROVIDER_SITE_OTHER): Payer: PRIVATE HEALTH INSURANCE | Admitting: Psychology

## 2013-02-10 DIAGNOSIS — F319 Bipolar disorder, unspecified: Secondary | ICD-10-CM | POA: Diagnosis not present

## 2013-02-17 ENCOUNTER — Ambulatory Visit (INDEPENDENT_AMBULATORY_CARE_PROVIDER_SITE_OTHER): Payer: PRIVATE HEALTH INSURANCE | Admitting: Psychology

## 2013-02-17 DIAGNOSIS — F319 Bipolar disorder, unspecified: Secondary | ICD-10-CM

## 2013-02-24 ENCOUNTER — Ambulatory Visit: Payer: PRIVATE HEALTH INSURANCE | Admitting: Psychology

## 2013-03-03 ENCOUNTER — Ambulatory Visit (INDEPENDENT_AMBULATORY_CARE_PROVIDER_SITE_OTHER): Payer: PRIVATE HEALTH INSURANCE | Admitting: Psychology

## 2013-03-03 DIAGNOSIS — F319 Bipolar disorder, unspecified: Secondary | ICD-10-CM | POA: Diagnosis not present

## 2013-03-10 ENCOUNTER — Ambulatory Visit (INDEPENDENT_AMBULATORY_CARE_PROVIDER_SITE_OTHER): Payer: PRIVATE HEALTH INSURANCE | Admitting: Psychology

## 2013-03-10 DIAGNOSIS — F319 Bipolar disorder, unspecified: Secondary | ICD-10-CM

## 2013-03-17 ENCOUNTER — Ambulatory Visit: Payer: PRIVATE HEALTH INSURANCE | Admitting: Psychology

## 2013-03-20 ENCOUNTER — Other Ambulatory Visit: Payer: Self-pay | Admitting: Cardiology

## 2013-03-24 ENCOUNTER — Ambulatory Visit (INDEPENDENT_AMBULATORY_CARE_PROVIDER_SITE_OTHER): Payer: PRIVATE HEALTH INSURANCE | Admitting: Psychology

## 2013-03-24 DIAGNOSIS — F319 Bipolar disorder, unspecified: Secondary | ICD-10-CM

## 2013-03-29 ENCOUNTER — Other Ambulatory Visit: Payer: Self-pay | Admitting: Cardiology

## 2013-03-31 ENCOUNTER — Other Ambulatory Visit: Payer: Self-pay | Admitting: Internal Medicine

## 2013-03-31 ENCOUNTER — Ambulatory Visit (INDEPENDENT_AMBULATORY_CARE_PROVIDER_SITE_OTHER): Payer: PRIVATE HEALTH INSURANCE | Admitting: Psychology

## 2013-03-31 ENCOUNTER — Encounter: Payer: Self-pay | Admitting: Internal Medicine

## 2013-03-31 DIAGNOSIS — F319 Bipolar disorder, unspecified: Secondary | ICD-10-CM | POA: Diagnosis not present

## 2013-03-31 MED ORDER — METFORMIN HCL ER 500 MG PO TB24: ORAL_TABLET | ORAL | Status: DC

## 2013-03-31 NOTE — Progress Notes (Signed)
Error

## 2013-04-01 ENCOUNTER — Other Ambulatory Visit: Payer: Self-pay | Admitting: *Deleted

## 2013-04-01 MED ORDER — METFORMIN HCL ER 500 MG PO TB24: ORAL_TABLET | ORAL | Status: DC

## 2013-04-03 ENCOUNTER — Ambulatory Visit: Payer: PRIVATE HEALTH INSURANCE | Admitting: Cardiology

## 2013-04-07 ENCOUNTER — Ambulatory Visit (INDEPENDENT_AMBULATORY_CARE_PROVIDER_SITE_OTHER): Payer: PRIVATE HEALTH INSURANCE | Admitting: Psychology

## 2013-04-07 DIAGNOSIS — F319 Bipolar disorder, unspecified: Secondary | ICD-10-CM | POA: Diagnosis not present

## 2013-04-14 ENCOUNTER — Ambulatory Visit (INDEPENDENT_AMBULATORY_CARE_PROVIDER_SITE_OTHER): Payer: PRIVATE HEALTH INSURANCE | Admitting: Psychology

## 2013-04-14 DIAGNOSIS — F319 Bipolar disorder, unspecified: Secondary | ICD-10-CM | POA: Diagnosis not present

## 2013-04-16 ENCOUNTER — Encounter: Payer: Self-pay | Admitting: Cardiology

## 2013-04-16 ENCOUNTER — Ambulatory Visit (INDEPENDENT_AMBULATORY_CARE_PROVIDER_SITE_OTHER): Payer: 59 | Admitting: Cardiology

## 2013-04-16 VITALS — BP 142/82 | HR 93 | Ht 65.0 in | Wt 365.0 lb

## 2013-04-16 DIAGNOSIS — I5022 Chronic systolic (congestive) heart failure: Secondary | ICD-10-CM | POA: Diagnosis not present

## 2013-04-16 DIAGNOSIS — I10 Essential (primary) hypertension: Secondary | ICD-10-CM | POA: Diagnosis not present

## 2013-04-16 NOTE — Patient Instructions (Signed)
The current medical regimen is effective;  continue present plan and medications.  Follow up in 1 year with Dr [name].  You will receive a letter in the mail 2 months before you are due.  Please call us when you receive this letter to schedule your follow up appointment.  

## 2013-04-16 NOTE — Progress Notes (Signed)
HPI This patient presents for evaluation of a mildly reduced ejection fraction. Since I last saw her she has done OK.  The patient denies any new symptoms such as  neck or arm discomfort. There has been no new shortness of breath, PND or orthopnea. There have been no reported palpitations, presyncope or syncope.  She has had some atypical right upper chest pain.  She thinks that this could be related to lifting and her pervious back problems.     Allergies  Allergen Reactions  . Savella [Milnacipran Hcl]     mania    Current Outpatient Prescriptions  Medication Sig Dispense Refill  . acetaminophen (TYLENOL) 500 MG tablet Take 2,000 mg by mouth 2 (two) times daily.       [name] ALPRAZolam (XANAX) 1 MG tablet Take 1 mg by mouth 3 (three) times daily.       . Ascorbic Acid (VITAMIN C) 500 MG tablet Take 1,000 mg by mouth daily.       [name] aspirin 81 MG tablet Take 81 mg by mouth every other day.       . b complex vitamins capsule Take 1 capsule by mouth daily.      . carbamazepine (TEGRETOL) 200 MG tablet Take 1 tablet in the morning and 2 tablets at night      . Cholecalciferol (VITAMIN D3) 5000 UNITS TABS Take 3 tablets by mouth daily.       . clonazePAM (KLONOPIN) 1 MG tablet Take 1 mg by mouth 3 (three) times daily.       . furosemide (LASIX) 40 MG tablet TAKE 2 TABLETS BY MOUTH EVERY MORNING AND TAKE 1 TABLET EVERY EVENING      . KLOR-CON M20 20 MEQ tablet TAKE 1 TABLET BY MOUTH EVERY DAY  90 tablet  0  . lamoTRIgine (LAMICTAL) 200 MG tablet Take 200 mg by mouth 2 (two) times daily.       [name] lidocaine (LIDODERM) 5 % Place 1 patch onto the skin as needed. Remove & Discard patch within 12 hours or as directed by MD      . losartan (COZAAR) 25 MG tablet TAKE 1 TABLET BY MOUTH TWICE A DAY  60 tablet  6  . Lurasidone HCl (LATUDA) 120 MG TABS Take 1 tablet by mouth daily.      [name] LYRICA 300 MG capsule Take 1 tablet by mouth Twice daily.      [name] MAGNESIUM PO Take 1 tablet by mouth daily.      .  Meloxicam (MOBIC PO) Take 7.5 mg by mouth every 12 (twelve) hours.       . metFORMIN (GLUCOPHAGE-XR) 500 MG 24 hr tablet Take 2 tablets daily as directed for Diabetes      . methocarbamol (ROBAXIN) 750 MG tablet Take 750 mg by mouth every 8 (eight) hours as needed.      . methylphenidate (RITALIN) 10 MG tablet 5-20 mg. Takes 20 mg morning and noon, and 5-10 mg at 4pm      . Multiple Vitamin (MULTIVITAMIN) tablet Take 1 tablet by mouth daily.        . norethindrone-ethinyl estradiol-iron (MICROGESTIN FE 1.5/30) 1.5-30 MG-MCG tablet Take 1 tablet by mouth at bedtime. Takes continuously      . OVER THE COUNTER MEDICATION Chromium once daily      . traMADol (ULTRAM) 50 MG tablet Take 1 tablet by mouth 4 times daily as needed. For pain      . TraMADol  HCl 200 MG TB24 Take 1-2 tablets by mouth daily at 12 noon.      . VOLTAREN 1 % GEL Apply three times daily as needed      . Zinc 50 MG CAPS Take 50 mg by mouth daily.       No current facility-administered medications for this visit.    Past Medical History  Diagnosis Date  . Morbid obesity   . Obesity hypoventilation syndrome   . Bipolar disorder   . Hypoxemia     history of - no home O2  . Cor pulmonale   . History of pleural effusion   . Spinal stenosis in cervical region   . Acute systolic congestive heart failure   . Seasonal allergies     current runny nose  . Arthritis     osteoarthritis bilal. knees  . Urinary incontinence   . Abrasion of breast 06/21/2011    left  . Sleep apnea sleep study 07/02/2010    uses CPAP nightly  . Asthma     no per PFT 7/13  . Seizures     febrile seizure x 1 as a child. Several times  . IBS (irritable bowel syndrome)   . Carpal tunnel syndrome of left wrist 05/2011    Being evaluated for MS  . Prediabetes   . Agoraphobia   . Hyperlipidemia   . Hypertension   . Depression   . Anxiety     panic attacks    Past Surgical History  Procedure Laterality Date  . Wisdom tooth extraction    . Mass  excision  08/07/2010    right index  . Carpal tunnel release  06/27/2011    Procedure: CARPAL TUNNEL RELEASE;  Surgeon: Wynonia Sours, MD;  Location: [place];  Service: Orthopedics;  Laterality: Left;  . Posterior cervical fusion/foraminotomy  09/11/2011    Procedure: POSTERIOR CERVICAL FUSION/FORAMINOTOMY LEVEL 1;  Surgeon: Erline Levine, MD;  Location: [facility];  Service: Neurosurgery;  Laterality: N/A;  Cervical Three-Four Posteior Cervical Fusion and Decompression.  . Carpal tunnel release  12/19/2011    Procedure: CARPAL TUNNEL RELEASE;  Surgeon: Wynonia Sours, MD;  Location: [place];  Service: Orthopedics;  Laterality: Right;  . Trigger finger release  12/19/2011    Procedure: RELEASE TRIGGER FINGER/A-1 PULLEY;  Surgeon: Wynonia Sours, MD;  Location: [place];  Service: Orthopedics;  Laterality: Right;  . Trigger finger release Left 12/31/2012    Procedure: RELEASE A-1 PULLEY LEFT THUMB;  Surgeon: Wynonia Sours, MD;  Location: [place];  Service: Orthopedics;  Laterality: Left;    ROS:  As stated in the HPI and negative for all other systems.  PHYSICAL EXAM BP 142/82  Pulse 93  Ht 5\' 5"  (1.651 m)  Wt 365 lb (165.563 kg)  BMI 60.74 kg/m2 GENERAL:  Well appearing LUNGS:  Clear to auscultation bilaterally BACK:  No CVA tenderness CHEST:  Unremarkable HEART:  PMI not displaced or sustained,S1 and S2 within normal limits, no S3, no S4, no clicks, no rubs, no murmurs ABD:  Flat, positive bowel sounds normal in frequency in pitch, no bruits, no rebound, no guarding, no midline pulsatile mass, no hepatomegaly, no splenomegaly, morbidly obese EXT:  2 plus pulses throughout, no edema, no cyanosis no clubbing PSYCH:  Cognitively intact, oriented to person place, animated today.  EKG:    Sinus rhythm, rate 93, axis within normal limits, minimal QT prolongation, RSR prime V1  and V2, no acute ST-T wave changes. No change from  previous.  04/16/2013  ASSESSMENT AND PLAN  Cardiomyopathy - She has a mild cardiomyopathy and she seems to be euvolemic today.  No change in therapy is indicated.   HTN -  Her BP is OK.  No change in therapy is indicated.    Anxeity -  She seems to be in good spirits today

## 2013-04-21 ENCOUNTER — Ambulatory Visit (INDEPENDENT_AMBULATORY_CARE_PROVIDER_SITE_OTHER): Payer: PRIVATE HEALTH INSURANCE | Admitting: Psychology

## 2013-04-21 DIAGNOSIS — F319 Bipolar disorder, unspecified: Secondary | ICD-10-CM | POA: Diagnosis not present

## 2013-04-28 ENCOUNTER — Ambulatory Visit (INDEPENDENT_AMBULATORY_CARE_PROVIDER_SITE_OTHER): Payer: PRIVATE HEALTH INSURANCE | Admitting: Psychology

## 2013-04-28 DIAGNOSIS — F319 Bipolar disorder, unspecified: Secondary | ICD-10-CM

## 2013-05-05 ENCOUNTER — Ambulatory Visit (INDEPENDENT_AMBULATORY_CARE_PROVIDER_SITE_OTHER): Payer: PRIVATE HEALTH INSURANCE | Admitting: Psychology

## 2013-05-05 DIAGNOSIS — F319 Bipolar disorder, unspecified: Secondary | ICD-10-CM

## 2013-05-12 ENCOUNTER — Ambulatory Visit (INDEPENDENT_AMBULATORY_CARE_PROVIDER_SITE_OTHER): Payer: PRIVATE HEALTH INSURANCE | Admitting: Psychology

## 2013-05-12 DIAGNOSIS — F319 Bipolar disorder, unspecified: Secondary | ICD-10-CM | POA: Diagnosis not present

## 2013-05-19 ENCOUNTER — Ambulatory Visit (INDEPENDENT_AMBULATORY_CARE_PROVIDER_SITE_OTHER): Payer: PRIVATE HEALTH INSURANCE | Admitting: Psychology

## 2013-05-19 DIAGNOSIS — F319 Bipolar disorder, unspecified: Secondary | ICD-10-CM | POA: Diagnosis not present

## 2013-05-25 DIAGNOSIS — G894 Chronic pain syndrome: Secondary | ICD-10-CM | POA: Diagnosis not present

## 2013-05-25 DIAGNOSIS — M5137 Other intervertebral disc degeneration, lumbosacral region: Secondary | ICD-10-CM | POA: Diagnosis not present

## 2013-05-25 DIAGNOSIS — Z79899 Other long term (current) drug therapy: Secondary | ICD-10-CM | POA: Diagnosis not present

## 2013-05-25 DIAGNOSIS — M5030 Other cervical disc degeneration, unspecified cervical region: Secondary | ICD-10-CM | POA: Diagnosis not present

## 2013-05-26 ENCOUNTER — Ambulatory Visit (INDEPENDENT_AMBULATORY_CARE_PROVIDER_SITE_OTHER): Payer: PRIVATE HEALTH INSURANCE | Admitting: Psychology

## 2013-05-26 DIAGNOSIS — F319 Bipolar disorder, unspecified: Secondary | ICD-10-CM

## 2013-06-02 ENCOUNTER — Ambulatory Visit (INDEPENDENT_AMBULATORY_CARE_PROVIDER_SITE_OTHER): Payer: PRIVATE HEALTH INSURANCE | Admitting: Psychology

## 2013-06-02 DIAGNOSIS — F319 Bipolar disorder, unspecified: Secondary | ICD-10-CM | POA: Diagnosis not present

## 2013-06-09 ENCOUNTER — Ambulatory Visit (INDEPENDENT_AMBULATORY_CARE_PROVIDER_SITE_OTHER): Payer: PRIVATE HEALTH INSURANCE | Admitting: Psychology

## 2013-06-09 DIAGNOSIS — F319 Bipolar disorder, unspecified: Secondary | ICD-10-CM | POA: Diagnosis not present

## 2013-06-14 ENCOUNTER — Other Ambulatory Visit: Payer: Self-pay | Admitting: Cardiology

## 2013-06-16 ENCOUNTER — Ambulatory Visit (INDEPENDENT_AMBULATORY_CARE_PROVIDER_SITE_OTHER): Payer: PRIVATE HEALTH INSURANCE | Admitting: Psychology

## 2013-06-16 DIAGNOSIS — F319 Bipolar disorder, unspecified: Secondary | ICD-10-CM | POA: Diagnosis not present

## 2013-06-19 ENCOUNTER — Other Ambulatory Visit: Payer: Self-pay

## 2013-06-19 MED ORDER — POTASSIUM CHLORIDE CRYS ER 20 MEQ PO TBCR: EXTENDED_RELEASE_TABLET | ORAL | Status: DC

## 2013-06-23 ENCOUNTER — Ambulatory Visit (INDEPENDENT_AMBULATORY_CARE_PROVIDER_SITE_OTHER): Payer: PRIVATE HEALTH INSURANCE | Admitting: Psychology

## 2013-06-23 DIAGNOSIS — F319 Bipolar disorder, unspecified: Secondary | ICD-10-CM

## 2013-06-29 ENCOUNTER — Other Ambulatory Visit: Payer: Self-pay | Admitting: *Deleted

## 2013-06-29 MED ORDER — LOSARTAN POTASSIUM 25 MG PO TABS: ORAL_TABLET | ORAL | Status: DC

## 2013-06-30 ENCOUNTER — Ambulatory Visit: Payer: PRIVATE HEALTH INSURANCE | Admitting: Psychology

## 2013-07-07 ENCOUNTER — Ambulatory Visit (INDEPENDENT_AMBULATORY_CARE_PROVIDER_SITE_OTHER): Payer: Medicare Other | Admitting: Psychology

## 2013-07-07 DIAGNOSIS — F319 Bipolar disorder, unspecified: Secondary | ICD-10-CM | POA: Diagnosis not present

## 2013-07-14 ENCOUNTER — Ambulatory Visit (INDEPENDENT_AMBULATORY_CARE_PROVIDER_SITE_OTHER): Payer: PRIVATE HEALTH INSURANCE | Admitting: Psychology

## 2013-07-14 DIAGNOSIS — F319 Bipolar disorder, unspecified: Secondary | ICD-10-CM | POA: Diagnosis not present

## 2013-07-15 ENCOUNTER — Other Ambulatory Visit: Payer: Self-pay

## 2013-07-15 DIAGNOSIS — Z1231 Encounter for screening mammogram for malignant neoplasm of breast: Secondary | ICD-10-CM

## 2013-07-21 ENCOUNTER — Ambulatory Visit (INDEPENDENT_AMBULATORY_CARE_PROVIDER_SITE_OTHER): Payer: PRIVATE HEALTH INSURANCE | Admitting: Psychology

## 2013-07-21 DIAGNOSIS — F319 Bipolar disorder, unspecified: Secondary | ICD-10-CM

## 2013-07-28 ENCOUNTER — Ambulatory Visit (INDEPENDENT_AMBULATORY_CARE_PROVIDER_SITE_OTHER): Payer: PRIVATE HEALTH INSURANCE | Admitting: Psychology

## 2013-07-28 DIAGNOSIS — F319 Bipolar disorder, unspecified: Secondary | ICD-10-CM | POA: Diagnosis not present

## 2013-07-29 ENCOUNTER — Ambulatory Visit: Payer: Self-pay

## 2013-08-04 ENCOUNTER — Ambulatory Visit (INDEPENDENT_AMBULATORY_CARE_PROVIDER_SITE_OTHER): Payer: PRIVATE HEALTH INSURANCE | Admitting: Psychology

## 2013-08-04 DIAGNOSIS — F319 Bipolar disorder, unspecified: Secondary | ICD-10-CM

## 2013-08-11 ENCOUNTER — Ambulatory Visit (INDEPENDENT_AMBULATORY_CARE_PROVIDER_SITE_OTHER): Payer: PRIVATE HEALTH INSURANCE | Admitting: Psychology

## 2013-08-11 DIAGNOSIS — F319 Bipolar disorder, unspecified: Secondary | ICD-10-CM | POA: Diagnosis not present

## 2013-08-16 ENCOUNTER — Other Ambulatory Visit: Payer: Self-pay | Admitting: Cardiology

## 2013-08-17 NOTE — Telephone Encounter (Signed)
Minus Breeding, MD at 04/16/2013  2:21 PM   furosemide (LASIX) 40 MG tablet  TAKE 2 TABLETS BY MOUTH EVERY MORNING AND TAKE 1 TABLET EVERY EVENING       The current medical regimen is effective;  continue present plan and medications

## 2013-08-25 ENCOUNTER — Ambulatory Visit (INDEPENDENT_AMBULATORY_CARE_PROVIDER_SITE_OTHER): Payer: PRIVATE HEALTH INSURANCE | Admitting: Psychology

## 2013-08-25 DIAGNOSIS — F319 Bipolar disorder, unspecified: Secondary | ICD-10-CM | POA: Diagnosis not present

## 2013-09-01 ENCOUNTER — Ambulatory Visit (INDEPENDENT_AMBULATORY_CARE_PROVIDER_SITE_OTHER): Payer: PRIVATE HEALTH INSURANCE | Admitting: Psychology

## 2013-09-01 DIAGNOSIS — F319 Bipolar disorder, unspecified: Secondary | ICD-10-CM | POA: Diagnosis not present

## 2013-09-07 ENCOUNTER — Other Ambulatory Visit: Payer: Self-pay | Admitting: Internal Medicine

## 2013-09-08 ENCOUNTER — Ambulatory Visit: Payer: PRIVATE HEALTH INSURANCE | Admitting: Psychology

## 2013-09-11 ENCOUNTER — Other Ambulatory Visit: Payer: Self-pay | Admitting: *Deleted

## 2013-09-15 ENCOUNTER — Ambulatory Visit (INDEPENDENT_AMBULATORY_CARE_PROVIDER_SITE_OTHER): Payer: Medicare Other | Admitting: Psychology

## 2013-09-15 ENCOUNTER — Telehealth: Payer: Self-pay | Admitting: Internal Medicine

## 2013-09-15 DIAGNOSIS — F319 Bipolar disorder, unspecified: Secondary | ICD-10-CM

## 2013-09-15 NOTE — Telephone Encounter (Signed)
PT REQUESTED METFORMIN REFILL- DENIED -* OV REQUIRED, PER MCK  L.O. LEFT MESSAGE FOR PATIENT TO CALL BACK AND SCHEDULE APPT. 09-11-13  Thank you, Katrina Judeth [facility] [phone] Fax [phone]

## 2013-09-22 ENCOUNTER — Ambulatory Visit (INDEPENDENT_AMBULATORY_CARE_PROVIDER_SITE_OTHER): Payer: Medicare Other | Admitting: Psychology

## 2013-09-22 DIAGNOSIS — F319 Bipolar disorder, unspecified: Secondary | ICD-10-CM | POA: Diagnosis not present

## 2013-09-24 DIAGNOSIS — Z1239 Encounter for other screening for malignant neoplasm of breast: Secondary | ICD-10-CM | POA: Diagnosis not present

## 2013-09-24 DIAGNOSIS — Z01419 Encounter for gynecological examination (general) (routine) without abnormal findings: Secondary | ICD-10-CM | POA: Diagnosis not present

## 2013-09-24 DIAGNOSIS — Z3002 Counseling and instruction in natural family planning to avoid pregnancy: Secondary | ICD-10-CM | POA: Diagnosis not present

## 2013-09-29 ENCOUNTER — Ambulatory Visit (INDEPENDENT_AMBULATORY_CARE_PROVIDER_SITE_OTHER): Payer: Medicare Other | Admitting: Psychology

## 2013-09-29 DIAGNOSIS — F319 Bipolar disorder, unspecified: Secondary | ICD-10-CM

## 2013-10-06 ENCOUNTER — Ambulatory Visit (INDEPENDENT_AMBULATORY_CARE_PROVIDER_SITE_OTHER): Payer: Medicare Other | Admitting: Psychology

## 2013-10-06 DIAGNOSIS — F319 Bipolar disorder, unspecified: Secondary | ICD-10-CM | POA: Diagnosis not present

## 2013-10-14 ENCOUNTER — Ambulatory Visit (INDEPENDENT_AMBULATORY_CARE_PROVIDER_SITE_OTHER): Payer: 59 | Admitting: Pulmonary Disease

## 2013-10-14 ENCOUNTER — Encounter (INDEPENDENT_AMBULATORY_CARE_PROVIDER_SITE_OTHER): Payer: Self-pay

## 2013-10-14 ENCOUNTER — Encounter: Payer: Self-pay | Admitting: Pulmonary Disease

## 2013-10-14 ENCOUNTER — Other Ambulatory Visit: Payer: Self-pay | Admitting: Cardiology

## 2013-10-14 VITALS — BP 158/98 | HR 101 | Ht 65.0 in | Wt 358.0 lb

## 2013-10-14 DIAGNOSIS — G4733 Obstructive sleep apnea (adult) (pediatric): Secondary | ICD-10-CM | POA: Diagnosis not present

## 2013-10-14 NOTE — Progress Notes (Signed)
   Subjective:    Patient ID: [name], female    DOB: May 03, 1970, 43 y.o.   MRN: [id number]  HPI  PCP [name]   49 WF never smoker with morbid obesity for FU of obstructive sleep apnea on CPAP 13 cm, obesity hypoventilation & 'asthma'  She also has DM-2 & bipolar -Sees Dr Toy Care every 8 wks  , Sees [name] every wk   Significant tests/ events  Admitted 04/2010 for worsening dyspnea & wt gain .  ABG was 7.40/49/63 on 4 L [place]. Echo showed EF 50% with mild decrease in systolic function. BNP > 500 & CXR showed a rt pleural effusion. CT angio neg for PE, She was diuresed > 50 lbs, discharged on O2 ([place]) .   PSG in '02 showed severe obstructive sleep apnea with AHI 40/h, lowest desatn to 52% correctd by 8 cm H2O . Her wt was 325 lbs then.  CPAP titration study >> required 10-16cm CPAP, higher pressure when supine/ REM sleep.  Download 6/18- 08/13/10 >>Good control of events on CPAP 13 cm - changed to this pressure.  12/2011 Download  -CPAP 13 cm very effective  O2 dc'd 10/12   PFTs 06/2011 - no obstruction, mild restriction, FVC 78%, DLCO 64%  Says previous dx with asthma has been tx in past with advair , theophylline.  allegra and nasonex added 4/13 for nasal symptoms but c/o increased asthma attacks after addition incl acute OV in 6/13 - changed regimen to Zyrtec +Chlortrimeton 6/13  Previously seen by allergist, recommended to have allergy vaccines . Declined.  +drug screen in chart review +[place] 2012.     10/14/2013  Chief Complaint  Patient presents with  . Follow-up    Pt states she is wearing CPAP nightly for 8-10 hours. Pt denies issues with machine, pressure and mask. Pt c/o slight increase in SOB but thinks it is d/t allergies.    Annual FU  Good CPAP usage by report, Pt is wearing CPAP machine everynight x 8 hrs or more. Denies any problems w/ mask/machine  No leak  Wt  358 lbs  -lower by 10 lbs  Pt reports breathing is slightly worse-thinks this maybe due  to allergies. No wheezing, no chest tx, no cough  She is slightly depressed, reports claustrophobia    Review of Systems neg for any significant sore throat, dysphagia, itching, sneezing, nasal congestion or excess/ purulent secretions, fever, chills, sweats, unintended wt loss, pleuritic or exertional cp, hempoptysis, orthopnea pnd or change in chronic leg swelling. Also denies presyncope, palpitations, heartburn, abdominal pain, nausea, vomiting, diarrhea or change in bowel or urinary habits, dysuria,hematuria, rash, arthralgias, visual complaints, headache, numbness weakness or ataxia.     Objective:   Physical Exam  Gen. Pleasant, obese, in no distress ENT - no lesions, no post nasal drip Neck: No JVD, no thyromegaly, no carotid bruits Lungs: no use of accessory muscles, no dullness to percussion, decreased without rales or rhonchi  Cardiovascular: Rhythm regular, heart sounds  normal, no murmurs or gallops, no peripheral edema Musculoskeletal: No deformities, no cyanosis or clubbing , no tremors       Assessment & Plan:

## 2013-10-14 NOTE — Patient Instructions (Signed)
Weight loss encouraged, CPAP compliance with goal of at least 6 hrs every night is the expectation. Advised against medications with sedative side effects Cautioned against driving when sleepy - understanding that sleepiness will vary on a day to day basis

## 2013-10-15 NOTE — Assessment & Plan Note (Signed)
Doubt true asthma Albuterol MDI when necessary wheeze

## 2013-10-15 NOTE — Assessment & Plan Note (Signed)
Weight loss encouraged, CPAP compliance with goal of at least 6 hrs every night is the expectation. Advised against medications with sedative side effects Cautioned against driving when sleepy - understanding that sleepiness will vary on a day to day basis

## 2013-10-20 ENCOUNTER — Ambulatory Visit (INDEPENDENT_AMBULATORY_CARE_PROVIDER_SITE_OTHER): Payer: Medicare Other | Admitting: Psychology

## 2013-10-20 DIAGNOSIS — F319 Bipolar disorder, unspecified: Secondary | ICD-10-CM

## 2013-10-27 ENCOUNTER — Ambulatory Visit (INDEPENDENT_AMBULATORY_CARE_PROVIDER_SITE_OTHER): Payer: Medicare Other | Admitting: Psychology

## 2013-10-27 DIAGNOSIS — F319 Bipolar disorder, unspecified: Secondary | ICD-10-CM | POA: Diagnosis not present

## 2013-11-03 ENCOUNTER — Ambulatory Visit (INDEPENDENT_AMBULATORY_CARE_PROVIDER_SITE_OTHER): Payer: PRIVATE HEALTH INSURANCE | Admitting: Psychology

## 2013-11-03 DIAGNOSIS — F319 Bipolar disorder, unspecified: Secondary | ICD-10-CM | POA: Diagnosis not present

## 2013-11-10 ENCOUNTER — Ambulatory Visit (INDEPENDENT_AMBULATORY_CARE_PROVIDER_SITE_OTHER): Payer: PRIVATE HEALTH INSURANCE | Admitting: Psychology

## 2013-11-10 DIAGNOSIS — F319 Bipolar disorder, unspecified: Secondary | ICD-10-CM | POA: Diagnosis not present

## 2013-11-16 ENCOUNTER — Other Ambulatory Visit: Payer: Self-pay | Admitting: Internal Medicine

## 2013-11-17 ENCOUNTER — Ambulatory Visit (INDEPENDENT_AMBULATORY_CARE_PROVIDER_SITE_OTHER): Payer: PRIVATE HEALTH INSURANCE | Admitting: Psychology

## 2013-11-17 DIAGNOSIS — F319 Bipolar disorder, unspecified: Secondary | ICD-10-CM

## 2013-11-23 ENCOUNTER — Other Ambulatory Visit: Payer: Self-pay | Admitting: Internal Medicine

## 2013-11-24 ENCOUNTER — Ambulatory Visit: Payer: PRIVATE HEALTH INSURANCE | Admitting: Psychology

## 2013-11-25 ENCOUNTER — Ambulatory Visit (INDEPENDENT_AMBULATORY_CARE_PROVIDER_SITE_OTHER): Payer: PRIVATE HEALTH INSURANCE | Admitting: Psychology

## 2013-11-25 DIAGNOSIS — F319 Bipolar disorder, unspecified: Secondary | ICD-10-CM | POA: Diagnosis not present

## 2013-12-01 ENCOUNTER — Ambulatory Visit (INDEPENDENT_AMBULATORY_CARE_PROVIDER_SITE_OTHER): Payer: Medicare Other | Admitting: Psychology

## 2013-12-01 DIAGNOSIS — F319 Bipolar disorder, unspecified: Secondary | ICD-10-CM

## 2013-12-08 ENCOUNTER — Ambulatory Visit (INDEPENDENT_AMBULATORY_CARE_PROVIDER_SITE_OTHER): Payer: Medicare Other | Admitting: Psychology

## 2013-12-08 DIAGNOSIS — F319 Bipolar disorder, unspecified: Secondary | ICD-10-CM | POA: Diagnosis not present

## 2013-12-10 ENCOUNTER — Encounter: Payer: Self-pay | Admitting: Family Medicine

## 2013-12-10 ENCOUNTER — Ambulatory Visit: Payer: Self-pay | Admitting: Family Medicine

## 2013-12-11 ENCOUNTER — Encounter: Payer: Self-pay | Admitting: Family Medicine

## 2013-12-11 ENCOUNTER — Ambulatory Visit (INDEPENDENT_AMBULATORY_CARE_PROVIDER_SITE_OTHER): Payer: 59 | Admitting: Family Medicine

## 2013-12-11 VITALS — BP 134/88 | HR 100 | Temp 98.9°F | Ht 65.0 in | Wt 369.0 lb

## 2013-12-11 DIAGNOSIS — R7303 Prediabetes: Secondary | ICD-10-CM

## 2013-12-11 DIAGNOSIS — F419 Anxiety disorder, unspecified: Secondary | ICD-10-CM

## 2013-12-11 DIAGNOSIS — F319 Bipolar disorder, unspecified: Secondary | ICD-10-CM | POA: Diagnosis not present

## 2013-12-11 DIAGNOSIS — J309 Allergic rhinitis, unspecified: Secondary | ICD-10-CM

## 2013-12-11 DIAGNOSIS — E785 Hyperlipidemia, unspecified: Secondary | ICD-10-CM | POA: Diagnosis not present

## 2013-12-11 DIAGNOSIS — Z7189 Other specified counseling: Secondary | ICD-10-CM | POA: Diagnosis not present

## 2013-12-11 DIAGNOSIS — R7309 Other abnormal glucose: Secondary | ICD-10-CM

## 2013-12-11 DIAGNOSIS — F329 Major depressive disorder, single episode, unspecified: Secondary | ICD-10-CM

## 2013-12-11 DIAGNOSIS — I10 Essential (primary) hypertension: Secondary | ICD-10-CM | POA: Diagnosis not present

## 2013-12-11 DIAGNOSIS — I5022 Chronic systolic (congestive) heart failure: Secondary | ICD-10-CM | POA: Diagnosis not present

## 2013-12-11 DIAGNOSIS — Z7689 Persons encountering health services in other specified circumstances: Secondary | ICD-10-CM

## 2013-12-11 DIAGNOSIS — F32A Depression, unspecified: Secondary | ICD-10-CM

## 2013-12-11 MED ORDER — METFORMIN HCL 500 MG PO TABS: 500.0000 mg | ORAL_TABLET | Freq: Two times a day (BID) | ORAL | Status: DC

## 2013-12-11 NOTE — Patient Instructions (Signed)
-  Discussed slowly simplifying your medication list with your psychiatrist and we will need permission to get your records from your psychiatrist.  -follow up in 3 months for a morning appointment and come fasting  -We recommend the following healthy lifestyle measures: - eat a healthy diet consisting of lots of vegetables, fruits, beans, nuts, seeds, healthy meats such as white chicken and fish and whole grains.  - avoid fried foods, fast food, processed foods, sodas, red meet and other fattening foods.  - get a least 150 minutes of aerobic exercise per week.

## 2013-12-11 NOTE — Progress Notes (Signed)
Pre visit review using our clinic review tool, if applicable. No additional management support is needed unless otherwise documented below in the visit note. 

## 2013-12-11 NOTE — Progress Notes (Addendum)
HPI:  [name] is here to establish care. Sees Dr. Anitra Lauth - but reports she did not like him. Reports someone at Dr. Idelle Leech office cancelled her appt with [place] due to "non-compliance" and being discharge from their office and she is not sure why. Last PCP and physical: sees gyn for her physical exams  Has the following chronic problems that require follow up and concerns today:  Asthma, OSA, Obesity hypoventilation syndrome: -seeing pulmonologist for this -per review of recent notes pulmonologist advised "Advised against medications with sedative side effects" -saw allergist in the past -has chronic eustachian tube dysfunction -on CPAP  L ear pain and pressure: -last week, now improving -reports hx of this with allergies, not taking anything for this  Bipolar disorder, agorhaphobic, Anxiety, hx psychotic episodes: -on an EXTENSIVE list of psych medication with sedative side effects with Dr. Toy Care - reports Dr. Toy Care checks labs frequently -meds: lyrica, latuda, lamictal, carbamazepine, methylphenidate, alprazolam, clonazepam, tramadol -on disability for psychiatric issues -has been stable on these medications per her report for at least 3 years and is aware of sig risks and interactions and is considering TMS and ECT  CHF, cardiomyopathy, HTN, ?HLD: -reports has mild CHF and sees Dr. Warren Lacy for this -on cozaar and lasic for this  -reports stable  DM: -on metformin for this - reports stable, reports needs refill  OPLL: -s/p neurosurgery, Dr. Vertell Limber  -advised follow up if recurs per her report  ROS negative for unless reported above: fevers, unintentional weight loss, hearing or vision loss, chest pain, palpitations, struggling to breath, hemoptysis, melena hematochezia, hematuria, falls, loc, si, thoughts of self harm  Past Medical History  Diagnosis Date  . Morbid obesity   . Obesity hypoventilation syndrome   . Bipolar disorder   . Hypoxemia     history of  - no home O2  . Cor pulmonale   . History of pleural effusion   . Spinal stenosis in cervical region   . Acute systolic congestive heart failure   . Seasonal allergies     current runny nose  . Arthritis     osteoarthritis bilal. knees  . Urinary incontinence   . Abrasion of breast 06/21/2011    left  . Sleep apnea sleep study 07/02/2010    uses CPAP nightly  . Asthma     no per PFT 7/13  . Seizures     febrile seizure x 1 as a child. Several times  . IBS (irritable bowel syndrome)   . Carpal tunnel syndrome of left wrist 05/2011    Being evaluated for MS  . Prediabetes   . Agoraphobia   . Hyperlipidemia   . Hypertension   . Depression   . Anxiety     panic attacks  . Allergy     Past Surgical History  Procedure Laterality Date  . Wisdom tooth extraction    . Mass excision  08/07/2010    right index  . Carpal tunnel release  06/27/2011    Procedure: CARPAL TUNNEL RELEASE;  Surgeon: Wynonia Sours, MD;  Location: [place];  Service: Orthopedics;  Laterality: Left;  . Posterior cervical fusion/foraminotomy  09/11/2011    Procedure: POSTERIOR CERVICAL FUSION/FORAMINOTOMY LEVEL 1;  Surgeon: Erline Levine, MD;  Location: [place];  Service: Neurosurgery;  Laterality: N/A;  Cervical Three-Four Posteior Cervical Fusion and Decompression.  . Carpal tunnel release  12/19/2011    Procedure: CARPAL TUNNEL RELEASE;  Surgeon: Wynonia Sours, MD;  Location: [facility];  Service: Orthopedics;  Laterality: Right;  . Trigger finger release  12/19/2011    Procedure: RELEASE TRIGGER FINGER/A-1 PULLEY;  Surgeon: Wynonia Sours, MD;  Location: [place];  Service: Orthopedics;  Laterality: Right;  . Trigger finger release Left 12/31/2012    Procedure: RELEASE A-1 PULLEY LEFT THUMB;  Surgeon: Wynonia Sours, MD;  Location: [place];  Service: Orthopedics;  Laterality: Left;    Family History  Problem Relation Age of Onset  . Asthma Mother    . Allergic rhinitis Mother   . Multiple sclerosis Mother   . Asthma Maternal Grandmother     History   Social History  . Marital Status: Married    Spouse Name: N/A    Number of Children: N/A  . Years of Education: N/A   Occupational History  . currently unemployed    Social History Main Topics  . Smoking status: Never Smoker   . Smokeless tobacco: Never Used  . Alcohol Use: Yes     Comment: rare  . Drug Use: No  . Sexual Activity: None   Other Topics Concern  . None   Social History Narrative   Lives in [place]          Current outpatient prescriptions: acetaminophen (TYLENOL) 500 MG tablet, Take 2,000 mg by mouth 2 (two) times daily. , Disp: , Rfl: ;  ALPRAZolam (XANAX) 1 MG tablet, Take 1 mg by mouth 3 (three) times daily. , Disp: , Rfl: ;  Ascorbic Acid (VITAMIN C) 500 MG tablet, Take 2,000 mg by mouth daily. , Disp: , Rfl: ;  aspirin 81 MG tablet, Take 81 mg by mouth every other day. , Disp: , Rfl:  b complex vitamins capsule, Take 1 capsule by mouth daily., Disp: , Rfl: ;  carbamazepine (TEGRETOL) 200 MG tablet, Take 1 tablet in the morning and 2 tablets at night, Disp: , Rfl: ;  Cholecalciferol (VITAMIN D3) 5000 UNITS TABS, Take 3 tablets by mouth daily. , Disp: , Rfl: ;  clonazePAM (KLONOPIN) 1 MG tablet, Take 1 mg by mouth 4 (four) times daily. , Disp: , Rfl:  furosemide (LASIX) 40 MG tablet, TAKE 2 TABLETS EVERY       MORNING AND 1 TABLET EVERY EVENING, Disp: 270 tablet, Rfl: 0;  lamoTRIgine (LAMICTAL) 200 MG tablet, Take 200 mg by mouth 2 (two) times daily. , Disp: , Rfl: ;  lidocaine (LIDODERM) 5 %, Place 1 patch onto the skin as needed. Remove & Discard patch within 12 hours or as directed by MD, Disp: , Rfl:  losartan (COZAAR) 25 MG tablet, TAKE 1 TABLET BY MOUTH TWICE A DAY, Disp: 180 tablet, Rfl: 1;  Lurasidone HCl (LATUDA) 120 MG TABS, Take 1 tablet by mouth daily., Disp: , Rfl: ;  LYRICA 300 MG capsule, Take 1 tablet by mouth Twice daily., Disp: , Rfl: ;   MAGNESIUM PO, Take 1 tablet by mouth daily., Disp: , Rfl: ;  Meloxicam (MOBIC PO), Take 7.5 mg by mouth every 12 (twelve) hours. , Disp: , Rfl:  metFORMIN (GLUCOPHAGE) 500 MG tablet, Take 1 tablet (500 mg total) by mouth 2 (two) times daily with a meal., Disp: 60 tablet, Rfl: 5;  methylphenidate (RITALIN) 10 MG tablet, 5-20 mg. Takes 20 mg morning and noon, and 5-10 mg at 4pm, Disp: , Rfl: ;  Multiple Vitamin (MULTIVITAMIN) tablet, Take 1 tablet by mouth daily.  , Disp: , Rfl:  norethindrone-ethinyl estradiol-iron (MICROGESTIN FE  1.5/30) 1.5-30 MG-MCG tablet, Take 1 tablet by mouth at bedtime. Takes continuously, Disp: , Rfl: ;  OVER THE COUNTER MEDICATION, Chromium once daily, Disp: , Rfl: ;  potassium chloride SA (KLOR-CON M20) 20 MEQ tablet, TAKE 1 TABLET BY MOUTH EVERY DAY, Disp: 90 tablet, Rfl: 3;  traMADol (ULTRAM-ER) 100 MG 24 hr tablet, Take 100 mg by mouth daily., Disp: , Rfl:  VOLTAREN 1 % GEL, Apply three times daily as needed, Disp: , Rfl: ;  Zinc 50 MG CAPS, Take 50 mg by mouth daily., Disp: , Rfl:   EXAM:  Filed Vitals:   12/11/13 1441  BP: 134/88  Pulse: 100  Temp: 98.9 F (37.2 C)    Body mass index is 61.4 kg/(m^2).  GENERAL: vitals reviewed and listed above, alert, oriented, appears well hydrated and in no acute distress  HEENT: atraumatic, conjunttiva clear, no obvious abnormalities on inspection of external nose and ears  NECK: no obvious masses on inspection  LUNGS: clear to auscultation bilaterally, no wheezes, rales or rhonchi, good air movement  CV: HRRR, no peripheral edema  MS: moves all extremities without noticeable abnormality  PSYCH: pleasant and cooperative, no obvious depression or anxiety  ASSESSMENT AND PLAN:  Discussed the following assessment and plan:  Allergic rhinitis, unspecified allergic rhinitis type  Encounter to establish care  Prediabetes  Essential hypertension  Hyperlipidemia  Chronic systolic heart failure    Anxiety  Bipolar affective disorder, most recent episode unspecified type, remission status unspecified  Depression  -We reviewed the PMH, PSH, FH, SH, Meds and Allergies. -We provided refills for any medications we will prescribe as needed. -We addressed current concerns per orders and patient instructions. -We have asked for records for pertinent exams, studies, vaccines and notes from previous providers. -We have advised patient to follow up per instructions below. -discussed significant risks and interactions with her current medication list and chronic medical problems - advised she work with her psychiatrist to slowly simplify her list as able - advised I do not feel comfortable managing her primary care needs long term with her current medication list and medical problems due to sig risks for serious interactions and side effects and advised if unable to reduce would advise she seek primary care with an internist -needs fasting lab work and she has agreed to follow up in 3 months for this and follow up -BP improved on recheck    -Patient advised to return or notify a doctor immediately if symptoms worsen or persist or new concerns arise.  Patient Instructions  -Discussed slowly simplifying your medication list with your psychiatrist and we will need permission to get your records from your psychiatrist.  -follow up in 3 months for a morning appointment and come fasting  -We recommend the following healthy lifestyle measures: - eat a healthy diet consisting of lots of vegetables, fruits, beans, nuts, seeds, healthy meats such as white chicken and fish and whole grains.  - avoid fried foods, fast food, processed foods, sodas, red meet and other fattening foods.  - get a least 150 minutes of aerobic exercise per week.       Colin Benton R.

## 2013-12-15 ENCOUNTER — Ambulatory Visit (INDEPENDENT_AMBULATORY_CARE_PROVIDER_SITE_OTHER): Payer: PRIVATE HEALTH INSURANCE | Admitting: Psychology

## 2013-12-15 DIAGNOSIS — F319 Bipolar disorder, unspecified: Secondary | ICD-10-CM

## 2013-12-20 ENCOUNTER — Other Ambulatory Visit: Payer: Self-pay | Admitting: Cardiology

## 2013-12-22 ENCOUNTER — Other Ambulatory Visit: Payer: Self-pay | Admitting: Cardiology

## 2013-12-22 ENCOUNTER — Ambulatory Visit (INDEPENDENT_AMBULATORY_CARE_PROVIDER_SITE_OTHER): Payer: PRIVATE HEALTH INSURANCE | Admitting: Psychology

## 2013-12-22 DIAGNOSIS — F319 Bipolar disorder, unspecified: Secondary | ICD-10-CM

## 2013-12-29 ENCOUNTER — Ambulatory Visit (INDEPENDENT_AMBULATORY_CARE_PROVIDER_SITE_OTHER): Payer: PRIVATE HEALTH INSURANCE | Admitting: Psychology

## 2013-12-29 DIAGNOSIS — F319 Bipolar disorder, unspecified: Secondary | ICD-10-CM

## 2014-01-05 ENCOUNTER — Ambulatory Visit (INDEPENDENT_AMBULATORY_CARE_PROVIDER_SITE_OTHER): Payer: PRIVATE HEALTH INSURANCE | Admitting: Psychology

## 2014-01-05 DIAGNOSIS — F319 Bipolar disorder, unspecified: Secondary | ICD-10-CM | POA: Diagnosis not present

## 2014-01-12 ENCOUNTER — Ambulatory Visit (INDEPENDENT_AMBULATORY_CARE_PROVIDER_SITE_OTHER): Payer: PRIVATE HEALTH INSURANCE | Admitting: Psychology

## 2014-01-12 DIAGNOSIS — F319 Bipolar disorder, unspecified: Secondary | ICD-10-CM | POA: Diagnosis not present

## 2014-01-19 ENCOUNTER — Ambulatory Visit (INDEPENDENT_AMBULATORY_CARE_PROVIDER_SITE_OTHER): Payer: PRIVATE HEALTH INSURANCE | Admitting: Psychology

## 2014-01-19 DIAGNOSIS — F319 Bipolar disorder, unspecified: Secondary | ICD-10-CM | POA: Diagnosis not present

## 2014-01-20 ENCOUNTER — Ambulatory Visit (INDEPENDENT_AMBULATORY_CARE_PROVIDER_SITE_OTHER): Payer: PRIVATE HEALTH INSURANCE | Admitting: Psychology

## 2014-01-20 DIAGNOSIS — F319 Bipolar disorder, unspecified: Secondary | ICD-10-CM

## 2014-01-26 ENCOUNTER — Ambulatory Visit (INDEPENDENT_AMBULATORY_CARE_PROVIDER_SITE_OTHER): Payer: PRIVATE HEALTH INSURANCE | Admitting: Psychology

## 2014-01-26 DIAGNOSIS — F319 Bipolar disorder, unspecified: Secondary | ICD-10-CM

## 2014-02-02 ENCOUNTER — Ambulatory Visit (INDEPENDENT_AMBULATORY_CARE_PROVIDER_SITE_OTHER): Payer: PRIVATE HEALTH INSURANCE | Admitting: Psychology

## 2014-02-02 DIAGNOSIS — F319 Bipolar disorder, unspecified: Secondary | ICD-10-CM

## 2014-02-09 ENCOUNTER — Ambulatory Visit (INDEPENDENT_AMBULATORY_CARE_PROVIDER_SITE_OTHER): Payer: PRIVATE HEALTH INSURANCE | Admitting: Psychology

## 2014-02-09 DIAGNOSIS — F319 Bipolar disorder, unspecified: Secondary | ICD-10-CM | POA: Diagnosis not present

## 2014-02-16 ENCOUNTER — Ambulatory Visit (INDEPENDENT_AMBULATORY_CARE_PROVIDER_SITE_OTHER): Payer: Medicare Other | Admitting: Psychology

## 2014-02-16 DIAGNOSIS — F319 Bipolar disorder, unspecified: Secondary | ICD-10-CM | POA: Diagnosis not present

## 2014-02-17 DIAGNOSIS — D2111 Benign neoplasm of connective and other soft tissue of right upper limb, including shoulder: Secondary | ICD-10-CM | POA: Diagnosis not present

## 2014-02-23 ENCOUNTER — Ambulatory Visit (INDEPENDENT_AMBULATORY_CARE_PROVIDER_SITE_OTHER): Payer: Medicare Other | Admitting: Psychology

## 2014-02-23 DIAGNOSIS — F319 Bipolar disorder, unspecified: Secondary | ICD-10-CM

## 2014-03-01 ENCOUNTER — Other Ambulatory Visit: Payer: Self-pay | Admitting: Cardiology

## 2014-03-02 ENCOUNTER — Ambulatory Visit: Payer: PRIVATE HEALTH INSURANCE | Admitting: Psychology

## 2014-03-03 ENCOUNTER — Ambulatory Visit (INDEPENDENT_AMBULATORY_CARE_PROVIDER_SITE_OTHER): Payer: 59 | Admitting: Family Medicine

## 2014-03-03 ENCOUNTER — Encounter: Payer: Self-pay | Admitting: Family Medicine

## 2014-03-03 ENCOUNTER — Other Ambulatory Visit: Payer: Self-pay | Admitting: *Deleted

## 2014-03-03 VITALS — BP 112/76 | Temp 98.4°F | Wt 360.0 lb

## 2014-03-03 DIAGNOSIS — H65191 Other acute nonsuppurative otitis media, right ear: Secondary | ICD-10-CM

## 2014-03-03 DIAGNOSIS — J0100 Acute maxillary sinusitis, unspecified: Secondary | ICD-10-CM | POA: Diagnosis not present

## 2014-03-03 MED ORDER — AZITHROMYCIN 250 MG PO TABS: ORAL_TABLET | ORAL | Status: DC

## 2014-03-03 MED ORDER — FUROSEMIDE 40 MG PO TABS: 40.0000 mg | ORAL_TABLET | Freq: Two times a day (BID) | ORAL | Status: DC

## 2014-03-03 NOTE — Progress Notes (Signed)
Pre visit review using our clinic review tool, if applicable. No additional management support is needed unless otherwise documented below in the visit note. 

## 2014-03-03 NOTE — Telephone Encounter (Signed)
Mrd refilled

## 2014-03-03 NOTE — Progress Notes (Signed)
   Subjective:    Patient ID: [name], female    DOB: [birth year], 45 y.o.   MRN: [id number]  HPI Here for 3 days of sinus pressure, right ear pain, PND ,and a dry cough.    Review of Systems  Constitutional: Negative.   HENT: Positive for congestion, ear pain, postnasal drip and sinus pressure.   Eyes: Negative.   Respiratory: Positive for cough.        Objective:   Physical Exam  Constitutional: She appears well-developed and well-nourished.  HENT:  Left Ear: External ear normal.  Nose: Nose normal.  Mouth/Throat: Oropharynx is clear and moist.  Right TM is dull and red   Eyes: Conjunctivae are normal.  Pulmonary/Chest: Effort normal and breath sounds normal.  Lymphadenopathy:    She has no cervical adenopathy.          Assessment & Plan:  Given a Zpack. Use Advil prn

## 2014-03-03 NOTE — Telephone Encounter (Signed)
Med refilled.

## 2014-03-09 ENCOUNTER — Ambulatory Visit (INDEPENDENT_AMBULATORY_CARE_PROVIDER_SITE_OTHER): Payer: PRIVATE HEALTH INSURANCE | Admitting: Psychology

## 2014-03-09 ENCOUNTER — Telehealth: Payer: Self-pay | Admitting: Family Medicine

## 2014-03-09 DIAGNOSIS — F319 Bipolar disorder, unspecified: Secondary | ICD-10-CM

## 2014-03-09 NOTE — Telephone Encounter (Signed)
Pt was seen on 03-03-14 by dr fry. Pt feels better however ear is not better. Pt finished zpak and mucinex. [facility] [place] [phone]. Pt would like eye drops

## 2014-03-10 MED ORDER — PRAMOXINE-HC-CHLOROXYLENOL 10-10-1 MG/ML OT SOLN: 4.0000 [drp] | OTIC | Status: DC | PRN

## 2014-03-10 MED ORDER — AMOXICILLIN-POT CLAVULANATE 875-125 MG PO TABS: 1.0000 | ORAL_TABLET | Freq: Two times a day (BID) | ORAL | Status: DC

## 2014-03-10 NOTE — Telephone Encounter (Signed)
I sent scripts e-scribe and left a voice message with this information.

## 2014-03-10 NOTE — Telephone Encounter (Signed)
Change from Cortisporin eye drops to Otomax drops to use 4 drops in ears every 4 hours prn, 10 ml with no rf

## 2014-03-10 NOTE — Telephone Encounter (Signed)
Pt following up on rx.  Pt states it is not in her eyes, it is her ears.  The hearing has not not returned .  Pt states she spoke w/ you about her ears bothering her at her appt.  Would like ear drops please. [facility]/ [address]

## 2014-03-10 NOTE — Telephone Encounter (Signed)
Call in Augmentin [phone] bid for 10 days, also Cortisporin ophthalmic susp to use 3 drops in eyes every 4 hours prn, 7.5 ml with no rf

## 2014-03-16 ENCOUNTER — Ambulatory Visit (INDEPENDENT_AMBULATORY_CARE_PROVIDER_SITE_OTHER): Payer: PRIVATE HEALTH INSURANCE | Admitting: Psychology

## 2014-03-16 DIAGNOSIS — F319 Bipolar disorder, unspecified: Secondary | ICD-10-CM | POA: Diagnosis not present

## 2014-03-18 ENCOUNTER — Encounter: Payer: Self-pay | Admitting: Family Medicine

## 2014-03-18 ENCOUNTER — Ambulatory Visit (INDEPENDENT_AMBULATORY_CARE_PROVIDER_SITE_OTHER): Payer: 59 | Admitting: Family Medicine

## 2014-03-18 VITALS — BP 138/80 | HR 93 | Temp 98.5°F | Ht 65.0 in | Wt 364.9 lb

## 2014-03-18 DIAGNOSIS — H9203 Otalgia, bilateral: Secondary | ICD-10-CM

## 2014-03-18 DIAGNOSIS — E119 Type 2 diabetes mellitus without complications: Secondary | ICD-10-CM

## 2014-03-18 DIAGNOSIS — Z23 Encounter for immunization: Secondary | ICD-10-CM

## 2014-03-18 DIAGNOSIS — I10 Essential (primary) hypertension: Secondary | ICD-10-CM | POA: Diagnosis not present

## 2014-03-18 LAB — HEMOGLOBIN A1C: HEMOGLOBIN A1C: 6.7 % — AB (ref 4.6–6.5)

## 2014-03-18 NOTE — Progress Notes (Signed)
HPI:  [name] is a 45 yo F with a complicated PMH (seeing many specialists when she established with me) here for follow up:    DM/morbidly obese: -meds: asa, metformin 500mg  bid, arb -diet and exercise: poor, advised -home BS: does not do -least eye exam: yearly  -complications: non -denies: hypoglycemia, wounds, vision changes -advised fasting labs at last visit  URI and Ear pain: -seen by my colleague and treated with augmentin and ear drops per phone notes -reports: doing better, a little popping in ears R>L -denies: fevers, SOB  Asthma, OSA, Obesity hypoventilation syndrome: -seeing pulmonologist for this -per review of recent notes pulmonologist advised "Advised against medications with sedative side effects" -saw allergist in the past -has chronic eustachian tube dysfunction -on CPAP  Bipolar disorder, agorhaphobic, Anxiety, hx psychotic episodes: -on an EXTENSIVE list of psych medication with sedative side effects with Dr. Toy Care - reports Dr. Toy Care checks labs frequently -meds: lyrica, latuda, lamictal, carbamazepine, methylphenidate, alprazolam, clonazepam, tramadol -on disability for psychiatric issues -has been stable on these medications per her report for at least 3 years and is aware of sig risks and interactions and is considering Post and ECT -I advised discussing simplifying medications as possible with psychiatrist due to concerns for polypharmacy  CHF, cardiomyopathy, HTN, ?HLD: -reports has mild CHF and sees Dr. Warren Lacy for this -on cozaar and [place] for this  -reports stable -denies: CP, palpitations, increased swelling -reports had labs 12/2013 and told cholesterol normal  OPLL: -s/p neurosurgery, Dr. Vertell Limber  -advised follow up if recurs per her report    ROS: See pertinent positives and negatives per HPI.  Past Medical History  Diagnosis Date  . Morbid obesity   . Obesity hypoventilation syndrome   . Bipolar disorder   .  Hypoxemia     history of - no home O2  . Cor pulmonale   . History of pleural effusion   . Spinal stenosis in cervical region   . Acute systolic congestive heart failure   . Seasonal allergies     current runny nose  . Arthritis     osteoarthritis bilal. knees  . Urinary incontinence   . Abrasion of breast 06/21/2011    left  . Sleep apnea sleep study 07/02/2010    uses CPAP nightly  . Asthma     no per PFT 7/13  . Seizures     febrile seizure x 1 as a child. Several times  . IBS (irritable bowel syndrome)   . Carpal tunnel syndrome of left wrist 05/2011    Being evaluated for MS  . Prediabetes   . Agoraphobia   . Hyperlipidemia   . Hypertension   . Depression   . Anxiety     panic attacks  . Allergy     Past Surgical History  Procedure Laterality Date  . Wisdom tooth extraction    . Mass excision  08/07/2010    right index  . Carpal tunnel release  06/27/2011    Procedure: CARPAL TUNNEL RELEASE;  Surgeon: Wynonia Sours, MD;  Location: [facility];  Service: Orthopedics;  Laterality: Left;  . Posterior cervical fusion/foraminotomy  09/11/2011    Procedure: POSTERIOR CERVICAL FUSION/FORAMINOTOMY LEVEL 1;  Surgeon: Erline Levine, MD;  Location: [facility];  Service: Neurosurgery;  Laterality: N/A;  Cervical Three-Four Posteior Cervical Fusion and Decompression.  . Carpal tunnel release  12/19/2011    Procedure: CARPAL TUNNEL RELEASE;  Surgeon: Wynonia Sours, MD;  Location: [facility];  Service: Orthopedics;  Laterality: Right;  . Trigger finger release  12/19/2011    Procedure: RELEASE TRIGGER FINGER/A-1 PULLEY;  Surgeon: Wynonia Sours, MD;  Location: [facility];  Service: Orthopedics;  Laterality: Right;  . Trigger finger release Left 12/31/2012    Procedure: RELEASE A-1 PULLEY LEFT THUMB;  Surgeon: Wynonia Sours, MD;  Location: [facility];  Service: Orthopedics;  Laterality: Left;    Family History  Problem Relation Age  of Onset  . Asthma Mother   . Allergic rhinitis Mother   . Multiple sclerosis Mother   . Asthma Maternal Grandmother     History   Social History  . Marital Status: Married    Spouse Name: N/A  . Number of Children: N/A  . Years of Education: N/A   Occupational History  . currently unemployed    Social History Main Topics  . Smoking status: Never Smoker   . Smokeless tobacco: Never Used  . Alcohol Use: Yes     Comment: rare  . Drug Use: No  . Sexual Activity: Not on file   Other Topics Concern  . None   Social History Narrative   Lives in [place]           Current outpatient prescriptions:  .  acetaminophen (TYLENOL) 500 MG tablet, Take 2,000 mg by mouth 2 (two) times daily. , Disp: , Rfl:  .  ALPRAZolam (XANAX) 1 MG tablet, Take 1 mg by mouth 3 (three) times daily. , Disp: , Rfl:  .  amoxicillin-clavulanate (AUGMENTIN) 875-125 MG per tablet, Take 1 tablet by mouth 2 (two) times daily., Disp: 20 tablet, Rfl: 0 .  Ascorbic Acid (VITAMIN C) 500 MG tablet, Take 2,000 mg by mouth daily. , Disp: , Rfl:  .  aspirin 81 MG tablet, Take 81 mg by mouth every other day. , Disp: , Rfl:  .  azithromycin (ZITHROMAX Z-PAK) 250 MG tablet, As directed, Disp: 6 each, Rfl: 0 .  b complex vitamins capsule, Take 1 capsule by mouth daily., Disp: , Rfl:  .  carbamazepine (TEGRETOL) 200 MG tablet, Take 1 tablet in the morning and 2 tablets at night, Disp: , Rfl:  .  Cholecalciferol (VITAMIN D3) 5000 UNITS TABS, Take 3 tablets by mouth daily. , Disp: , Rfl:  .  clonazePAM (KLONOPIN) 1 MG tablet, Take 1 mg by mouth 4 (four) times daily. , Disp: , Rfl:  .  furosemide (LASIX) 40 MG tablet, TAKE 2 TABLETS EVERY       MORNING AND TAKE 1 TABLET  EVERY EVENING, Disp: 270 tablet, Rfl: 0 .  lamoTRIgine (LAMICTAL) 200 MG tablet, Take 200 mg by mouth 2 (two) times daily. , Disp: , Rfl:  .  lidocaine (LIDODERM) 5 %, Place 1 patch onto the skin as needed. Remove & Discard patch within 12 hours or as  directed by MD, Disp: , Rfl:  .  losartan (COZAAR) 25 MG tablet, TAKE 1 TABLET BY MOUTH TWICE DAILY, Disp: 180 tablet, Rfl: 0 .  Lurasidone HCl (LATUDA) 120 MG TABS, Take 1 tablet by mouth daily., Disp: , Rfl:  .  LYRICA 300 MG capsule, Take 1 tablet by mouth Twice daily., Disp: , Rfl:  .  MAGNESIUM PO, Take 1 tablet by mouth daily., Disp: , Rfl:  .  Meloxicam (MOBIC PO), Take 7.5 mg by mouth every 12 (twelve) hours. , Disp: , Rfl:  .  metFORMIN (GLUCOPHAGE) 500 MG tablet, Take 1 tablet (  500 mg total) by mouth 2 (two) times daily with a meal., Disp: 60 tablet, Rfl: 5 .  methylphenidate (RITALIN) 10 MG tablet, 5-20 mg. Takes 20 mg morning and noon, and 5-10 mg at 4pm, Disp: , Rfl:  .  Multiple Vitamin (MULTIVITAMIN) tablet, Take 1 tablet by mouth daily.  , Disp: , Rfl:  .  norethindrone-ethinyl estradiol-iron (MICROGESTIN FE 1.5/30) 1.5-30 MG-MCG tablet, Take 1 tablet by mouth at bedtime. Takes continuously, Disp: , Rfl:  .  OVER THE COUNTER MEDICATION, Chromium once daily, Disp: , Rfl:  .  potassium chloride SA (KLOR-CON M20) 20 MEQ tablet, TAKE 1 TABLET BY MOUTH EVERY DAY, Disp: 90 tablet, Rfl: 3 .  Pramoxine-HC-Chloroxylenol (OTOMAX-HC) 10-10-1 MG/ML SOLN, Place 4 drops in ear(s) every 4 (four) hours as needed., Disp: 10 mL, Rfl: 0 .  traMADol (ULTRAM-ER) 200 MG 24 hr tablet, , Disp: , Rfl: 0 .  VOLTAREN 1 % GEL, Apply three times daily as needed, Disp: , Rfl:  .  Zinc 50 MG CAPS, Take 50 mg by mouth daily., Disp: , Rfl:   EXAM:  Filed Vitals:   03/18/14 1410  BP: 138/80  Pulse:   Temp:     Body mass index is 60.72 kg/(m^2).  GENERAL: vitals reviewed and listed above, alert, oriented, appears well hydrated and in no acute distress  HEENT: atraumatic, conjunttiva clear, no obvious abnormalities on inspection of external nose and ears, normal appearance of ear canals and TMs, clear nasal congestion, mild post oropharyngeal erythema with PND, no tonsillar edema or exudate, no sinus  TTP  NECK: no obvious masses on inspection  LUNGS: clear to auscultation bilaterally, no wheezes, rales or rhonchi, good air movement  CV: HRRR, no peripheral edema  MS: moves all extremities without noticeable abnormality  PSYCH: pleasant and cooperative, no obvious depression or anxiety  ASSESSMENT AND PLAN:  Discussed the following assessment and plan:  Type 2 diabetes mellitus without complication -BULA4T today -lifestyle counseling  Essential hypertension -BP better on recheck, she reports managed by her cardiologist and fine at home Morbid obesity  Ear pain, bilateral -improving, advised ENT if persists   Follow up in 3  Months  -Patient advised to return or notify a doctor immediately if symptoms worsen or persist or new concerns arise.  Patient Instructions  BEFORE YOU LEAVE: -Tdap -labs  We recommend the following healthy lifestyle measures: - eat a healthy diet consisting of lots of vegetables, fruits, beans, nuts, seeds, healthy meats such as white chicken and fish and whole grains.  - avoid fried foods, fast food, processed foods, sodas, red meet and other fattening foods.  - get a least 150 minutes of aerobic exercise per week.       Colin Benton R.

## 2014-03-18 NOTE — Progress Notes (Signed)
BP managed by cards

## 2014-03-18 NOTE — Patient Instructions (Signed)
BEFORE YOU LEAVE: -Tdap -labs  We recommend the following healthy lifestyle measures: - eat a healthy diet consisting of lots of vegetables, fruits, beans, nuts, seeds, healthy meats such as white chicken and fish and whole grains.  - avoid fried foods, fast food, processed foods, sodas, red meet and other fattening foods.  - get a least 150 minutes of aerobic exercise per week.

## 2014-03-18 NOTE — Progress Notes (Signed)
Pre visit review using our clinic review tool, if applicable. No additional management support is needed unless otherwise documented below in the visit note. 

## 2014-03-20 ENCOUNTER — Telehealth: Payer: Self-pay | Admitting: Family Medicine

## 2014-03-20 NOTE — Telephone Encounter (Signed)
emmi mailed  °

## 2014-03-22 ENCOUNTER — Other Ambulatory Visit: Payer: Self-pay | Admitting: Cardiology

## 2014-03-23 ENCOUNTER — Telehealth: Payer: Self-pay | Admitting: Family Medicine

## 2014-03-23 ENCOUNTER — Ambulatory Visit (INDEPENDENT_AMBULATORY_CARE_PROVIDER_SITE_OTHER): Payer: PRIVATE HEALTH INSURANCE | Admitting: Psychology

## 2014-03-23 DIAGNOSIS — F319 Bipolar disorder, unspecified: Secondary | ICD-10-CM | POA: Diagnosis not present

## 2014-03-23 NOTE — Telephone Encounter (Signed)
[facility] - Client [facility] Patient Name: MARDELLE PANDOLFI I Gender: Female DOB: 1970-12-17 Age: 44 Y 19 M Return Phone Number: [phone] (Primary), [phone] (Secondary) Address: [place] City/State/Zip: [place] [address] Client [facility] - Client Client Site [facility] - Day Contact Type Call Call Type Triage / Clinical Relationship To Patient Self Appointment Disposition EMR Appointment Attempted - Not Scheduled Return Phone Number [phone] (Primary) Chief Complaint Immunization Reaction Initial Comment caller states that she was in office last thursday, received tetanus shot, now has increased redness and swelling at injection site PreDisposition Simpson pasted into Epic No Nurse Assessment Nurse: Melina Modena, RN, Estill Bamberg Date/Time (Eastern Time): 03/22/2014 4:39:31 PM Confirm and document reason for call. If symptomatic, describe symptoms. ---caller states that she was in office last thursday, received tetanus shot, now has increased redness and swelling at injection site; no fever Has the patient traveled out of the country within the last 30 days? ---Not Applicable Does the patient require triage? ---Yes Related visit to physician within the last 2 weeks? ---Yes Does the PT have any chronic conditions? (i.e. diabetes, asthma, etc.) ---Yes List chronic conditions. ---chf, diabetes Did the patient indicate they were pregnant? ---No Guidelines Guideline Title Affirmed Question Affirmed Notes Nurse Date/Time (Eastern Time) Immunization Reactions [1] Redness or red streak around the injection site AND [2] begins > 48 hours after shot AND [3] no fever (Exception: red area < 1 inch or 2.5 cm wide) [place], RN, Estill Bamberg 03/22/2014 5:04:22 PM Disp. Time

## 2014-03-23 NOTE — Telephone Encounter (Signed)
I called the pt and advised her per Dr Maudie Mercury this is a normal reaction and she can use ice packs a few times a day or if she is concerned about infection she could come in for an appt.  The pt states she feels a lot better after using a cold washcloth, hydrocortisone cream and Neosporin last night and will use the ice packs and call back if needed.

## 2014-03-29 ENCOUNTER — Ambulatory Visit (INDEPENDENT_AMBULATORY_CARE_PROVIDER_SITE_OTHER): Payer: 59 | Admitting: Family Medicine

## 2014-03-29 ENCOUNTER — Encounter: Payer: Self-pay | Admitting: Family Medicine

## 2014-03-29 VITALS — BP 120/80 | HR 91 | Temp 98.7°F | Ht 65.0 in | Wt 363.2 lb

## 2014-03-29 DIAGNOSIS — J069 Acute upper respiratory infection, unspecified: Secondary | ICD-10-CM

## 2014-03-29 MED ORDER — BENZONATATE 100 MG PO CAPS: 100.0000 mg | ORAL_CAPSULE | Freq: Two times a day (BID) | ORAL | Status: DC | PRN

## 2014-03-29 NOTE — Patient Instructions (Signed)
INSTRUCTIONS FOR UPPER RESPIRATORY INFECTION:  -plenty of rest and fluids  -nasal saline wash 2-3 times daily (use prepackaged nasal saline or bottled/distilled water if making your own)   -can use AFRIN nasal spray for drainage and nasal congestion - but do NOT use longer then 3-4 days  -can use tylenol or ibuprofen as directed for aches and sorethroat  -in the winter time, using a humidifier at night is helpful (please follow cleaning instructions)  -if you are taking a cough medication - use only as directed, may also try a teaspoon of honey to coat the throat and throat lozenges  -for sore throat, salt water gargles can help  -follow up if you have fevers, facial pain, tooth pain, difficulty breathing or are worsening or not getting better in 5-7 days

## 2014-03-29 NOTE — Progress Notes (Signed)
HPI:  URI: -started: 2 days ago; was sick a few weeks ago but that completely resolved -symptoms:nasal congestion, sore throat, cough, mild SOB -denies:fever, SOB, NVD, tooth pain -has tried: nothing -sick contacts/travel/risks: denies flu exposure, tick exposure or or Ebola risks; husband sick with the same -Hx of: allergies and ? Mild asthma - reports normal PFTs in the past  ROS: See pertinent positives and negatives per HPI.  Past Medical History  Diagnosis Date  . Morbid obesity   . Obesity hypoventilation syndrome   . Bipolar disorder   . Hypoxemia     history of - no home O2  . Cor pulmonale   . History of pleural effusion   . Spinal stenosis in cervical region   . Acute systolic congestive heart failure   . Seasonal allergies     current runny nose  . Arthritis     osteoarthritis bilal. knees  . Urinary incontinence   . Abrasion of breast 06/21/2011    left  . Sleep apnea sleep study 07/02/2010    uses CPAP nightly  . Asthma     no per PFT 7/13  . Seizures     febrile seizure x 1 as a child. Several times  . IBS (irritable bowel syndrome)   . Carpal tunnel syndrome of left wrist 05/2011    Being evaluated for MS  . Prediabetes   . Agoraphobia   . Hyperlipidemia   . Hypertension   . Depression   . Anxiety     panic attacks  . Allergy     Past Surgical History  Procedure Laterality Date  . Wisdom tooth extraction    . Mass excision  08/07/2010    right index  . Carpal tunnel release  06/27/2011    Procedure: CARPAL TUNNEL RELEASE;  Surgeon: Wynonia Sours, MD;  Location: [facility];  Service: Orthopedics;  Laterality: Left;  . Posterior cervical fusion/foraminotomy  09/11/2011    Procedure: POSTERIOR CERVICAL FUSION/FORAMINOTOMY LEVEL 1;  Surgeon: Erline Levine, MD;  Location: [facility];  Service: Neurosurgery;  Laterality: N/A;  Cervical Three-Four Posteior Cervical Fusion and Decompression.  . Carpal tunnel release  12/19/2011   Procedure: CARPAL TUNNEL RELEASE;  Surgeon: Wynonia Sours, MD;  Location: [facility];  Service: Orthopedics;  Laterality: Right;  . Trigger finger release  12/19/2011    Procedure: RELEASE TRIGGER FINGER/A-1 PULLEY;  Surgeon: Wynonia Sours, MD;  Location: [facility];  Service: Orthopedics;  Laterality: Right;  . Trigger finger release Left 12/31/2012    Procedure: RELEASE A-1 PULLEY LEFT THUMB;  Surgeon: Wynonia Sours, MD;  Location: [facility];  Service: Orthopedics;  Laterality: Left;    Family History  Problem Relation Age of Onset  . Asthma Mother   . Allergic rhinitis Mother   . Multiple sclerosis Mother   . Asthma Maternal Grandmother     History   Social History  . Marital Status: Married    Spouse Name: N/A  . Number of Children: N/A  . Years of Education: N/A   Occupational History  . currently unemployed    Social History Main Topics  . Smoking status: Never Smoker   . Smokeless tobacco: Never Used  . Alcohol Use: Yes     Comment: rare  . Drug Use: No  . Sexual Activity: Not on file   Other Topics Concern  . None   Social History Narrative   Lives in [place]  Current outpatient prescriptions:  .  ALPRAZolam (XANAX) 1 MG tablet, Take 1 mg by mouth 3 (three) times daily. , Disp: , Rfl:  .  Ascorbic Acid (VITAMIN C) 500 MG tablet, Take 2,000 mg by mouth daily. , Disp: , Rfl:  .  aspirin 81 MG tablet, Take 81 mg by mouth every other day. , Disp: , Rfl:  .  b complex vitamins capsule, Take 1 capsule by mouth daily., Disp: , Rfl:  .  carbamazepine (TEGRETOL) 200 MG tablet, Take 1 tablet in the morning and 2 tablets at night, Disp: , Rfl:  .  Cholecalciferol (VITAMIN D3) 5000 UNITS TABS, Take 3 tablets by mouth daily. , Disp: , Rfl:  .  clonazePAM (KLONOPIN) 1 MG tablet, Take 1 mg by mouth 4 (four) times daily. , Disp: , Rfl:  .  furosemide (LASIX) 40 MG tablet, TAKE 2 TABLETS EVERY       MORNING AND TAKE 1  TABLET  EVERY EVENING, Disp: 270 tablet, Rfl: 0 .  lamoTRIgine (LAMICTAL) 200 MG tablet, Take 200 mg by mouth 2 (two) times daily. , Disp: , Rfl:  .  lidocaine (LIDODERM) 5 %, Place 1 patch onto the skin as needed. Remove & Discard patch within 12 hours or as directed by MD, Disp: , Rfl:  .  losartan (COZAAR) 25 MG tablet, TAKE 1 TABLET BY MOUTH TWICE A DAY, Disp: 180 tablet, Rfl: 0 .  Lurasidone HCl (LATUDA) 120 MG TABS, Take 1 tablet by mouth daily., Disp: , Rfl:  .  LYRICA 300 MG capsule, Take 1 tablet by mouth Twice daily., Disp: , Rfl:  .  MAGNESIUM PO, Take 1 tablet by mouth daily., Disp: , Rfl:  .  Meloxicam (MOBIC PO), Take 7.5 mg by mouth every 12 (twelve) hours. , Disp: , Rfl:  .  metFORMIN (GLUCOPHAGE) 500 MG tablet, Take 1 tablet (500 mg total) by mouth 2 (two) times daily with a meal., Disp: 60 tablet, Rfl: 5 .  methylphenidate (RITALIN) 10 MG tablet, 5-20 mg. Takes 20 mg morning and noon, and 5-10 mg at 4pm, Disp: , Rfl:  .  Multiple Vitamin (MULTIVITAMIN) tablet, Take 1 tablet by mouth daily.  , Disp: , Rfl:  .  norethindrone-ethinyl estradiol-iron (MICROGESTIN FE 1.5/30) 1.5-30 MG-MCG tablet, Take 1 tablet by mouth at bedtime. Takes continuously, Disp: , Rfl:  .  OVER THE COUNTER MEDICATION, Chromium once daily, Disp: , Rfl:  .  potassium chloride SA (KLOR-CON M20) 20 MEQ tablet, TAKE 1 TABLET BY MOUTH EVERY DAY, Disp: 90 tablet, Rfl: 3 .  Pramoxine-HC-Chloroxylenol (OTOMAX-HC) 10-10-1 MG/ML SOLN, Place 4 drops in ear(s) every 4 (four) hours as needed., Disp: 10 mL, Rfl: 0 .  traMADol (ULTRAM-ER) 200 MG 24 hr tablet, , Disp: , Rfl: 0 .  VOLTAREN 1 % GEL, Apply three times daily as needed, Disp: , Rfl:  .  Zinc 50 MG CAPS, Take 50 mg by mouth daily., Disp: , Rfl:  .  acetaminophen (TYLENOL) 500 MG tablet, Take 2,000 mg by mouth 2 (two) times daily. , Disp: , Rfl:  .  benzonatate (TESSALON) 100 MG capsule, Take 1 capsule (100 mg total) by mouth 2 (two) times daily as needed for  cough., Disp: 20 capsule, Rfl: 0  EXAM:  Filed Vitals:   03/29/14 1611  BP: 120/80  Pulse: 91  Temp: 98.7 F (37.1 C)    Body mass index is 60.44 kg/(m^2).  GENERAL: vitals reviewed and listed above, alert, oriented, appears well hydrated  and in no acute distress  HEENT: atraumatic, conjunttiva clear, no obvious abnormalities on inspection of external nose and ears, normal appearance of ear canals and TMs, clear nasal congestion, mild post oropharyngeal erythema with PND, no tonsillar edema or exudate, no sinus TTP  NECK: no obvious masses on inspection  LUNGS: clear to auscultation bilaterally, no wheezes, rales or rhonchi, good air movement  CV: HRRR, no peripheral edema  MS: moves all extremities without noticeable abnormality  PSYCH: pleasant and cooperative, no obvious depression or anxiety  ASSESSMENT AND PLAN:  Discussed the following assessment and plan:  Upper respiratory infection - Plan: benzonatate (TESSALON) 100 MG capsule  -given HPI and exam findings today, a serious infection or illness is unlikely. We discussed potential etiologies, with VURI being most likely, and advised supportive care and monitoring. We discussed treatment side effects, likely course, antibiotic misuse, transmission, and signs of developing a serious illness. -of course, we advised to return or notify a doctor immediately if symptoms worsen or persist or new concerns arise.    Patient Instructions  INSTRUCTIONS FOR UPPER RESPIRATORY INFECTION:  -plenty of rest and fluids  -nasal saline wash 2-3 times daily (use prepackaged nasal saline or bottled/distilled water if making your own)   -can use AFRIN nasal spray for drainage and nasal congestion - but do NOT use longer then 3-4 days  -can use tylenol or ibuprofen as directed for aches and sorethroat  -in the winter time, using a humidifier at night is helpful (please follow cleaning instructions)  -if you are taking a cough  medication - use only as directed, may also try a teaspoon of honey to coat the throat and throat lozenges  -for sore throat, salt water gargles can help  -follow up if you have fevers, facial pain, tooth pain, difficulty breathing or are worsening or not getting better in 5-7 days      KIM, HANNAH R.

## 2014-03-29 NOTE — Progress Notes (Signed)
Pre visit review using our clinic review tool, if applicable. No additional management support is needed unless otherwise documented below in the visit note. 

## 2014-03-30 ENCOUNTER — Telehealth: Payer: Self-pay | Admitting: Family Medicine

## 2014-03-30 ENCOUNTER — Ambulatory Visit: Payer: Medicare Other | Admitting: Psychology

## 2014-03-30 NOTE — Telephone Encounter (Signed)
FYI pt [facility] not use [facility]. Pt local pharm [facility] on [address]

## 2014-03-30 NOTE — Telephone Encounter (Signed)
I removed [facility] from the pts pharmacy selection.

## 2014-04-01 ENCOUNTER — Encounter: Payer: Self-pay | Admitting: Internal Medicine

## 2014-04-06 ENCOUNTER — Ambulatory Visit (INDEPENDENT_AMBULATORY_CARE_PROVIDER_SITE_OTHER): Payer: PRIVATE HEALTH INSURANCE | Admitting: Psychology

## 2014-04-06 DIAGNOSIS — F319 Bipolar disorder, unspecified: Secondary | ICD-10-CM

## 2014-04-13 ENCOUNTER — Ambulatory Visit (INDEPENDENT_AMBULATORY_CARE_PROVIDER_SITE_OTHER): Payer: PRIVATE HEALTH INSURANCE | Admitting: Psychology

## 2014-04-13 DIAGNOSIS — F319 Bipolar disorder, unspecified: Secondary | ICD-10-CM | POA: Diagnosis not present

## 2014-04-20 ENCOUNTER — Ambulatory Visit (INDEPENDENT_AMBULATORY_CARE_PROVIDER_SITE_OTHER): Payer: PRIVATE HEALTH INSURANCE | Admitting: Psychology

## 2014-04-20 DIAGNOSIS — F319 Bipolar disorder, unspecified: Secondary | ICD-10-CM

## 2014-04-27 ENCOUNTER — Ambulatory Visit (INDEPENDENT_AMBULATORY_CARE_PROVIDER_SITE_OTHER): Payer: PRIVATE HEALTH INSURANCE | Admitting: Psychology

## 2014-04-27 DIAGNOSIS — F319 Bipolar disorder, unspecified: Secondary | ICD-10-CM | POA: Diagnosis not present

## 2014-04-28 DIAGNOSIS — D2111 Benign neoplasm of connective and other soft tissue of right upper limb, including shoulder: Secondary | ICD-10-CM | POA: Diagnosis not present

## 2014-05-04 ENCOUNTER — Ambulatory Visit (INDEPENDENT_AMBULATORY_CARE_PROVIDER_SITE_OTHER): Payer: 59 | Admitting: Cardiology

## 2014-05-04 ENCOUNTER — Encounter: Payer: Self-pay | Admitting: Cardiology

## 2014-05-04 ENCOUNTER — Ambulatory Visit (INDEPENDENT_AMBULATORY_CARE_PROVIDER_SITE_OTHER): Payer: PRIVATE HEALTH INSURANCE | Admitting: Psychology

## 2014-05-04 VITALS — BP 160/88 | HR 94 | Ht 65.0 in | Wt 368.3 lb

## 2014-05-04 DIAGNOSIS — F319 Bipolar disorder, unspecified: Secondary | ICD-10-CM | POA: Diagnosis not present

## 2014-05-04 DIAGNOSIS — I429 Cardiomyopathy, unspecified: Secondary | ICD-10-CM

## 2014-05-04 NOTE — Patient Instructions (Signed)
Your physician wants you to follow-up in: [place] will receive a reminder letter in the mail two months in advance. If you don't receive a letter, please call our office to schedule the follow-up appointment.

## 2014-05-04 NOTE — Progress Notes (Signed)
HPI This patient presents for evaluation of a mildly reduced ejection fraction. Since I last saw her she has done OK.  The patient denies any new symptoms such as  neck or arm discomfort. There has been no new shortness of breath, PND or orthopnea. There have been no reported palpitations, presyncope or syncope.  She has had some atypical right upper chest pain.  She did have upper respiratory infection the month of February and had a tough time treating this but now feels better.    Allergies  Allergen Reactions  . Savella [Milnacipran Hcl]     mania    Current Outpatient Prescriptions  Medication Sig Dispense Refill  . acetaminophen (TYLENOL) 500 MG tablet Take 2,000 mg by mouth 2 (two) times daily.     [name] ALPRAZolam (XANAX) 1 MG tablet Take 1 mg by mouth 3 (three) times daily.     . Ascorbic Acid (VITAMIN C) 500 MG tablet Take 1,000 mg by mouth daily.     [name] aspirin 81 MG tablet Take 81 mg by mouth every other day.     . b complex vitamins capsule Take 1 capsule by mouth daily.    . carbamazepine (TEGRETOL) 200 MG tablet Take 1 tablet in the morning and 2 tablets at night    . Cholecalciferol (VITAMIN D3) 5000 UNITS TABS Take 3 tablets by mouth daily.     . Chromium Picolinate 500 MCG CAPS Take 1 capsule by mouth daily.    . clonazePAM (KLONOPIN) 1 MG tablet Take 1 mg by mouth 4 (four) times daily.     [name] Fexofenadine HCl (ALLEGRA PO) Take 1 tablet by mouth daily as needed (allergies).    . furosemide (LASIX) 40 MG tablet TAKE 2 TABLETS EVERY       MORNING AND TAKE 1 TABLET  EVERY EVENING 270 tablet 0  . lamoTRIgine (LAMICTAL) 200 MG tablet Take 200 mg by mouth 2 (two) times daily.     [name] lidocaine (LIDODERM) 5 % Place 1 patch onto the skin as needed. Remove & Discard patch within 12 hours or as directed by MD    . losartan (COZAAR) 25 MG tablet TAKE 1 TABLET BY MOUTH TWICE A DAY 180 tablet 0  . Lurasidone HCl (LATUDA) 120 MG TABS Take 1 tablet by mouth daily.    [name] LYRICA 300 MG  capsule Take 1 tablet by mouth Twice daily.    [name] MAGNESIUM PO Take 1 tablet by mouth daily.    . Meloxicam (MOBIC PO) Take 7.5 mg by mouth every 12 (twelve) hours.     . metFORMIN (GLUCOPHAGE) 500 MG tablet Take 1 tablet (500 mg total) by mouth 2 (two) times daily with a meal. 60 tablet 5  . methylphenidate (RITALIN) 10 MG tablet 5-20 mg. Takes 20 mg morning and noon, and 5-10 mg at 4pm    . Multiple Vitamin (MULTIVITAMIN) tablet Take 1 tablet by mouth daily.      . norethindrone-ethinyl estradiol-iron (MICROGESTIN FE 1.5/30) 1.5-30 MG-MCG tablet Take 1 tablet by mouth at bedtime. Takes continuously    . potassium chloride SA (KLOR-CON M20) 20 MEQ tablet TAKE 1 TABLET BY MOUTH EVERY DAY 90 tablet 3  . Pramoxine-HC-Chloroxylenol (OTOMAX-HC) 10-10-1 MG/ML SOLN Place 4 drops in ear(s) every 4 (four) hours as needed. 10 mL 0  . traMADol (ULTRAM-ER) 200 MG 24 hr tablet   0  . VOLTAREN 1 % GEL Apply three times daily as needed    . Zinc  50 MG CAPS Take 50 mg by mouth daily.     No current facility-administered medications for this visit.    Past Medical History  Diagnosis Date  . Morbid obesity   . Obesity hypoventilation syndrome   . Bipolar disorder   . Hypoxemia     history of - no home O2  . Cor pulmonale   . History of pleural effusion   . Spinal stenosis in cervical region   . Acute systolic congestive heart failure   . Seasonal allergies     current runny nose  . Arthritis     osteoarthritis bilal. knees  . Urinary incontinence   . Abrasion of breast 06/21/2011    left  . Sleep apnea sleep study 07/02/2010    uses CPAP nightly  . Asthma     no per PFT 7/13  . Seizures     febrile seizure x 1 as a child. Several times  . IBS (irritable bowel syndrome)   . Carpal tunnel syndrome of left wrist 05/2011    Being evaluated for MS  . Prediabetes   . Agoraphobia   . Hyperlipidemia   . Hypertension   . Depression   . Anxiety     panic attacks  . Allergy     Past Surgical  History  Procedure Laterality Date  . Wisdom tooth extraction    . Mass excision  08/07/2010    right index  . Carpal tunnel release  06/27/2011    Procedure: CARPAL TUNNEL RELEASE;  Surgeon: Wynonia Sours, MD;  Location: [facility];  Service: Orthopedics;  Laterality: Left;  . Posterior cervical fusion/foraminotomy  09/11/2011    Procedure: POSTERIOR CERVICAL FUSION/FORAMINOTOMY LEVEL 1;  Surgeon: Erline Levine, MD;  Location: [facility];  Service: Neurosurgery;  Laterality: N/A;  Cervical Three-Four Posteior Cervical Fusion and Decompression.  . Carpal tunnel release  12/19/2011    Procedure: CARPAL TUNNEL RELEASE;  Surgeon: Wynonia Sours, MD;  Location: [facility];  Service: Orthopedics;  Laterality: Right;  . Trigger finger release  12/19/2011    Procedure: RELEASE TRIGGER FINGER/A-1 PULLEY;  Surgeon: Wynonia Sours, MD;  Location: [facility];  Service: Orthopedics;  Laterality: Right;  . Trigger finger release Left 12/31/2012    Procedure: RELEASE A-1 PULLEY LEFT THUMB;  Surgeon: Wynonia Sours, MD;  Location: [place];  Service: Orthopedics;  Laterality: Left;    ROS:  As stated in the HPI and negative for all other systems.  PHYSICAL EXAM BP 160/88 mmHg  Pulse 94  Ht 5\' 5"  (1.651 m)  Wt 368 lb 4.8 oz (167.06 kg)  BMI 61.29 kg/m2 GENERAL:  Well appearing LUNGS:  Clear to auscultation bilaterally BACK:  No CVA tenderness CHEST:  Unremarkable HEART:  PMI not displaced or sustained,S1 and S2 within normal limits, no S3, no S4, no clicks, no rubs, no murmurs ABD:  Flat, positive bowel sounds normal in frequency in pitch, no bruits, no rebound, no guarding, no midline pulsatile mass, no hepatomegaly, no splenomegaly, morbidly obese EXT:  2 plus pulses throughout, no edema, no cyanosis no clubbing PSYCH:  Cognitively intact, oriented to person place, animated today.  EKG:    Sinus rhythm, rate 93, axis within normal limits, minimal  QT prolongation, RSR prime V1 and V2, no acute ST-T wave changes. No change from previous.  05/04/2014  ASSESSMENT AND PLAN  Cardiomyopathy - She has a mild cardiomyopathy and she seems to be  euvolemic today.  No change in therapy is indicated.   HTN -  Her BP is elevated but she says this is quite unusual. She checks it every day at home and it is normal. She will take that I on this..  No change in therapy is indicated.    Anxiety -  She seems to be in good spirits today

## 2014-05-09 ENCOUNTER — Other Ambulatory Visit: Payer: Self-pay | Admitting: Cardiology

## 2014-05-11 ENCOUNTER — Ambulatory Visit: Payer: PRIVATE HEALTH INSURANCE | Admitting: Psychology

## 2014-05-18 ENCOUNTER — Ambulatory Visit (INDEPENDENT_AMBULATORY_CARE_PROVIDER_SITE_OTHER): Payer: PRIVATE HEALTH INSURANCE | Admitting: Psychology

## 2014-05-18 DIAGNOSIS — F319 Bipolar disorder, unspecified: Secondary | ICD-10-CM | POA: Diagnosis not present

## 2014-05-20 ENCOUNTER — Other Ambulatory Visit: Payer: Self-pay | Admitting: Family Medicine

## 2014-05-21 ENCOUNTER — Other Ambulatory Visit: Payer: Self-pay | Admitting: Cardiology

## 2014-05-25 ENCOUNTER — Encounter: Payer: Self-pay | Admitting: Family Medicine

## 2014-05-25 ENCOUNTER — Ambulatory Visit (INDEPENDENT_AMBULATORY_CARE_PROVIDER_SITE_OTHER): Payer: 59 | Admitting: Family Medicine

## 2014-05-25 ENCOUNTER — Telehealth: Payer: Self-pay | Admitting: Family Medicine

## 2014-05-25 ENCOUNTER — Ambulatory Visit (INDEPENDENT_AMBULATORY_CARE_PROVIDER_SITE_OTHER): Payer: PRIVATE HEALTH INSURANCE | Admitting: Psychology

## 2014-05-25 VITALS — BP 144/90 | HR 94 | Temp 98.8°F | Ht 65.0 in | Wt 364.0 lb

## 2014-05-25 DIAGNOSIS — J329 Chronic sinusitis, unspecified: Secondary | ICD-10-CM | POA: Diagnosis not present

## 2014-05-25 DIAGNOSIS — F319 Bipolar disorder, unspecified: Secondary | ICD-10-CM

## 2014-05-25 MED ORDER — BENZONATATE 100 MG PO CAPS: 100.0000 mg | ORAL_CAPSULE | Freq: Two times a day (BID) | ORAL | Status: DC | PRN

## 2014-05-25 MED ORDER — DOXYCYCLINE HYCLATE 100 MG PO TABS: 100.0000 mg | ORAL_TABLET | Freq: Two times a day (BID) | ORAL | Status: DC

## 2014-05-25 MED ORDER — MOMETASONE FUROATE 50 MCG/ACT NA SUSP: 2.0000 | Freq: Every day | NASAL | Status: DC

## 2014-05-25 NOTE — Progress Notes (Signed)
Pre visit review using our clinic review tool, if applicable. No additional management support is needed unless otherwise documented below in the visit note. 

## 2014-05-25 NOTE — Progress Notes (Signed)
HPI:  URI: -started: 12-13 days ago - not worse, but not better - wants some tessalon perles as this works really well for cough when she is sick, some facial pain on R, thick nasal congestion -symptoms:nasal congestion, sore throat, cough, sneezing, itchy eyes, r ear ache -denies:fever, SOB, NVD, tooth pain -has tried: Human resources officer daily, musinex, sudafed -sick contacts/travel/risks: denies flu exposure, tick exposure or or Ebola risks -Hx of: allergies -reports her baseline O2 is 96%  ROS: See pertinent positives and negatives per HPI.  Past Medical History  Diagnosis Date  . Morbid obesity   . Obesity hypoventilation syndrome   . Bipolar disorder   . Hypoxemia     history of - no home O2  . Cor pulmonale   . History of pleural effusion   . Spinal stenosis in cervical region   . Acute systolic congestive heart failure   . Seasonal allergies     current runny nose  . Arthritis     osteoarthritis bilal. knees  . Urinary incontinence   . Abrasion of breast 06/21/2011    left  . Sleep apnea sleep study 07/02/2010    uses CPAP nightly  . Asthma     no per PFT 7/13; reports does not have asthma  . Seizures     febrile seizure x 1 as a child. Several times  . IBS (irritable bowel syndrome)   . Carpal tunnel syndrome of left wrist 05/2011    Being evaluated for MS  . Prediabetes   . Agoraphobia   . Hyperlipidemia   . Hypertension   . Depression   . Anxiety     panic attacks  . Allergy     Past Surgical History  Procedure Laterality Date  . Wisdom tooth extraction    . Mass excision  08/07/2010    right index  . Carpal tunnel release  06/27/2011    Procedure: CARPAL TUNNEL RELEASE;  Surgeon: Wynonia Sours, MD;  Location: [facility];  Service: Orthopedics;  Laterality: Left;  . Posterior cervical fusion/foraminotomy  09/11/2011    Procedure: POSTERIOR CERVICAL FUSION/FORAMINOTOMY LEVEL 1;  Surgeon: Erline Levine, MD;  Location: [facility];  Service:  Neurosurgery;  Laterality: N/A;  Cervical Three-Four Posteior Cervical Fusion and Decompression.  . Carpal tunnel release  12/19/2011    Procedure: CARPAL TUNNEL RELEASE;  Surgeon: Wynonia Sours, MD;  Location: [facility];  Service: Orthopedics;  Laterality: Right;  . Trigger finger release  12/19/2011    Procedure: RELEASE TRIGGER FINGER/A-1 PULLEY;  Surgeon: Wynonia Sours, MD;  Location: [facility];  Service: Orthopedics;  Laterality: Right;  . Trigger finger release Left 12/31/2012    Procedure: RELEASE A-1 PULLEY LEFT THUMB;  Surgeon: Wynonia Sours, MD;  Location: [facility];  Service: Orthopedics;  Laterality: Left;    Family History  Problem Relation Age of Onset  . Asthma Mother   . Allergic rhinitis Mother   . Multiple sclerosis Mother   . Asthma Maternal Grandmother     History   Social History  . Marital Status: Married    Spouse Name: N/A  . Number of Children: N/A  . Years of Education: N/A   Occupational History  . currently unemployed    Social History Main Topics  . Smoking status: Never Smoker   . Smokeless tobacco: Never Used  . Alcohol Use: Yes     Comment: rare  . Drug Use: No  .  Sexual Activity: Not on file   Other Topics Concern  . None   Social History Narrative   Lives in [place]           Current outpatient prescriptions:  .  acetaminophen (TYLENOL) 500 MG tablet, Take 2,000 mg by mouth 2 (two) times daily. , Disp: , Rfl:  .  ALPRAZolam (XANAX) 1 MG tablet, Take 1 mg by mouth 3 (three) times daily. , Disp: , Rfl:  .  Ascorbic Acid (VITAMIN C) 500 MG tablet, Take 1,000 mg by mouth daily. , Disp: , Rfl:  .  aspirin 81 MG tablet, Take 81 mg by mouth every other day. , Disp: , Rfl:  .  b complex vitamins capsule, Take 1 capsule by mouth daily., Disp: , Rfl:  .  carbamazepine (TEGRETOL) 200 MG tablet, Take 1 tablet in the morning and 2 tablets at night, Disp: , Rfl:  .  Cholecalciferol (VITAMIN D3)  5000 UNITS TABS, Take 3 tablets by mouth daily. , Disp: , Rfl:  .  Chromium Picolinate 500 MCG CAPS, Take 1 capsule by mouth daily., Disp: , Rfl:  .  clonazePAM (KLONOPIN) 1 MG tablet, Take 1 mg by mouth 4 (four) times daily. , Disp: , Rfl:  .  Fexofenadine HCl (ALLEGRA PO), Take 1 tablet by mouth daily as needed (allergies)., Disp: , Rfl:  .  furosemide (LASIX) 40 MG tablet, TAKE 2 TABLETS EVERY       MORNING AND TAKE 1 TABLET  EVERY EVENING, Disp: 270 tablet, Rfl: 0 .  furosemide (LASIX) 40 MG tablet, TAKE 2 TABLETS EVERY       MORNING AND 1 TABLET EVERY EVENING, Disp: 270 tablet, Rfl: 3 .  KLOR-CON M20 20 MEQ tablet, TAKE 1 TABLET DAILY, Disp: 90 tablet, Rfl: 3 .  lamoTRIgine (LAMICTAL) 200 MG tablet, Take 200 mg by mouth 2 (two) times daily. , Disp: , Rfl:  .  lidocaine (LIDODERM) 5 %, Place 1 patch onto the skin as needed. Remove & Discard patch within 12 hours or as directed by MD, Disp: , Rfl:  .  losartan (COZAAR) 25 MG tablet, TAKE 1 TABLET BY MOUTH TWICE A DAY, Disp: 180 tablet, Rfl: 0 .  Lurasidone HCl (LATUDA) 120 MG TABS, Take 1 tablet by mouth daily., Disp: , Rfl:  .  LYRICA 300 MG capsule, Take 1 tablet by mouth Twice daily., Disp: , Rfl:  .  MAGNESIUM PO, Take 1 tablet by mouth daily., Disp: , Rfl:  .  Meloxicam (MOBIC PO), Take 7.5 mg by mouth every 12 (twelve) hours. , Disp: , Rfl:  .  metFORMIN (GLUCOPHAGE) 500 MG tablet, TAKE 1 TABLET BY MOUTH TWICE DAILY WITH MEALS, Disp: 180 tablet, Rfl: 1 .  methylphenidate (RITALIN) 10 MG tablet, 5-20 mg. Takes 20 mg morning and noon, and 5-10 mg at 4pm, Disp: , Rfl:  .  Multiple Vitamin (MULTIVITAMIN) tablet, Take 1 tablet by mouth daily.  , Disp: , Rfl:  .  norethindrone-ethinyl estradiol-iron (MICROGESTIN FE 1.5/30) 1.5-30 MG-MCG tablet, Take 1 tablet by mouth at bedtime. Takes continuously, Disp: , Rfl:  .  Pramoxine-HC-Chloroxylenol (OTOMAX-HC) 10-10-1 MG/ML SOLN, Place 4 drops in ear(s) every 4 (four) hours as needed., Disp: 10 mL,  Rfl: 0 .  traMADol (ULTRAM-ER) 200 MG 24 hr tablet, , Disp: , Rfl: 0 .  VOLTAREN 1 % GEL, Apply three times daily as needed, Disp: , Rfl:  .  Zinc 50 MG CAPS, Take 50 mg by mouth daily., Disp: ,  Rfl:  .  benzonatate (TESSALON) 100 MG capsule, Take 1 capsule (100 mg total) by mouth 2 (two) times daily as needed for cough., Disp: 20 capsule, Rfl: 0 .  doxycycline (VIBRA-TABS) 100 MG tablet, Take 1 tablet (100 mg total) by mouth 2 (two) times daily., Disp: 20 tablet, Rfl: 0 .  mometasone (NASONEX) 50 MCG/ACT nasal spray, Place 2 sprays into the nose daily., Disp: 17 g, Rfl: 1  EXAM:  Filed Vitals:   05/25/14 1612  BP: 144/90  Pulse: 94  Temp: 98.8 F (37.1 C)    Body mass index is 60.57 kg/(m^2).  GENERAL: vitals reviewed and listed above, alert, oriented, appears well hydrated and in no acute distress  HEENT: atraumatic, conjunttiva clear, no obvious abnormalities on inspection of external nose and ears, normal appearance of ear canals and TMs, clear nasal congestion, mild post oropharyngeal erythema with PND, no tonsillar edema or exudate, no sinus TTP  NECK: no obvious masses on inspection  LUNGS: clear to auscultation bilaterally, no wheezes, rales or rhonchi, good air movement  CV: HRRR, no peripheral edema  MS: moves all extremities without noticeable abnormality  PSYCH: pleasant and cooperative, no obvious depression or anxiety  ASSESSMENT AND PLAN:  Discussed the following assessment and plan:  Rhinosinusitis - Plan: doxycycline (VIBRA-TABS) 100 MG tablet  -given HPI and exam findings today, a serious infection or illness is unlikely. We discussed potential etiologies, with VURI and allergic pathologies being most likely, and advised INS supportive care and monitoring. We discussed treatment side effects, likely course, antibiotic misuse, transmission, and signs of developing a serious illness. -she is very anxious about office visit, abx given for delayed tx if  worseneing or not improving as expected  -she sees her cardiologist for her BP - reports she is monitoring this at home, mildy high at doctor appts due to extreme anxiety -of course, we advised to return or notify a doctor immediately if symptoms worsen or persist or new concerns arise.    Patient Instructions  -nasonex daily for 21 days  -tessalon for cough as needed  -antibiotic if worsening or if not improving over the next 3-4 days - complete the course if you start it. Shred if you do not use the prescription.     Jessica Benton R.

## 2014-05-25 NOTE — Telephone Encounter (Signed)
Rxs done. 

## 2014-05-25 NOTE — Telephone Encounter (Signed)
Pt needs rxs send to [facility] not [facility]. Pt was seen today

## 2014-05-25 NOTE — Patient Instructions (Signed)
-  nasonex daily for 21 days  -tessalon for cough as needed  -antibiotic if worsening or if not improving over the next 3-4 days - complete the course if you start it. Shred if you do not use the prescription.

## 2014-05-26 ENCOUNTER — Telehealth: Payer: Self-pay | Admitting: Family Medicine

## 2014-05-26 MED ORDER — MOMETASONE FUROATE 50 MCG/ACT NA SUSP: 2.0000 | Freq: Every day | NASAL | Status: DC

## 2014-05-26 NOTE — Telephone Encounter (Signed)
Pt saw dr Maudie Mercury yesterday, prescribed mometasone (NASONEX) 50 MCG/ACT nasal spray  Was sent to [facility] by mistake, can you resend to  [facility]

## 2014-05-26 NOTE — Telephone Encounter (Signed)
Rx done. 

## 2014-05-27 ENCOUNTER — Telehealth: Payer: Self-pay | Admitting: *Deleted

## 2014-05-27 NOTE — Telephone Encounter (Signed)
Called patient and left message to call office back. Spoke to pharmacy and they indicated mail-order has already filled prescription. Pharmacist says order is reading 'refilled too soon.' OTC medications similar to Nasonex include Nasacort $15.99+tax and Flonase $47.99+tax. Wendie Simmer plans to speak with MD Maudie Mercury about recommendation and to update patient.

## 2014-05-27 NOTE — Telephone Encounter (Signed)
I called the pt and apologized for the error with her prescriptions and advised her per Dr Maudie Mercury she could use an OTC nasal spray as listed below and the prices and she stated she will await the mail order as she was told the Rx was shipped and should arrive in 3-5 days not 2 weeks as I thought most mail orders Rxs take.

## 2014-05-27 NOTE — Telephone Encounter (Signed)
Error

## 2014-05-27 NOTE — Telephone Encounter (Signed)
I am not sure what the question is. Ok to use OTC options if she prefers or rx per her wishes. Thanks.

## 2014-06-01 ENCOUNTER — Ambulatory Visit (INDEPENDENT_AMBULATORY_CARE_PROVIDER_SITE_OTHER): Payer: PRIVATE HEALTH INSURANCE | Admitting: Psychology

## 2014-06-01 DIAGNOSIS — F319 Bipolar disorder, unspecified: Secondary | ICD-10-CM | POA: Diagnosis not present

## 2014-06-08 ENCOUNTER — Ambulatory Visit (INDEPENDENT_AMBULATORY_CARE_PROVIDER_SITE_OTHER): Payer: PRIVATE HEALTH INSURANCE | Admitting: Psychology

## 2014-06-08 DIAGNOSIS — F319 Bipolar disorder, unspecified: Secondary | ICD-10-CM | POA: Diagnosis not present

## 2014-06-15 ENCOUNTER — Ambulatory Visit (INDEPENDENT_AMBULATORY_CARE_PROVIDER_SITE_OTHER): Payer: PRIVATE HEALTH INSURANCE | Admitting: Psychology

## 2014-06-15 DIAGNOSIS — F319 Bipolar disorder, unspecified: Secondary | ICD-10-CM

## 2014-06-17 ENCOUNTER — Ambulatory Visit: Payer: Self-pay | Admitting: Family Medicine

## 2014-06-22 ENCOUNTER — Ambulatory Visit (INDEPENDENT_AMBULATORY_CARE_PROVIDER_SITE_OTHER): Payer: PRIVATE HEALTH INSURANCE | Admitting: Psychology

## 2014-06-22 DIAGNOSIS — F319 Bipolar disorder, unspecified: Secondary | ICD-10-CM

## 2014-06-24 ENCOUNTER — Ambulatory Visit (INDEPENDENT_AMBULATORY_CARE_PROVIDER_SITE_OTHER): Payer: 59 | Admitting: Podiatry

## 2014-06-24 ENCOUNTER — Encounter: Payer: Self-pay | Admitting: Podiatry

## 2014-06-24 VITALS — BP 145/81 | HR 96 | Resp 16

## 2014-06-24 DIAGNOSIS — L600 Ingrowing nail: Secondary | ICD-10-CM

## 2014-06-24 MED ORDER — NEOMYCIN-POLYMYXIN-HC 1 % OT SOLN: OTIC | Status: DC

## 2014-06-24 NOTE — Progress Notes (Signed)
   Subjective:    Patient ID: [name], female    DOB: May 19, 1970, 44 y.o.   MRN: [id number]  HPI Comments: "I may have some ingrowns"  Patient c/o tender 1st toes bilateral, medial borders, left over right, for few months. She has been trimming them out and soaking. The right is better.  "I'm not technically not diabetic yet. They told me the A1C was 6.7.     Review of Systems  Respiratory: Positive for apnea.   Psychiatric/Behavioral: The patient is nervous/anxious.   All other systems reviewed and are negative.      Objective:   Physical Exam: I have reviewed her past medical history medications social history and review of systems. Neurologic intact presents with a monofilament. Reflexes are intact bilateral muscle strength +5 over 5 dorsiflexion plantar flexors and inverters everters all intrinsic musculature is intact. Orthopedic evaluation demonstrates also is today range of motion without crepitation. She has sharply incurvated nail margins and the tibial borders of the hallux bilateral left greater than right. There is mild erythema and no cellulitis drainage or odor to the hallux left area        Assessment & Plan:  Assessment: Ingrown toenail paronychia hallux left greater than right.  Plan: Discussed etiology pathology conservative versus surgical therapy. Matrixectomy was performed after local anesthesia was achieved the left foot. His matrixectomy was performed and 3 applications of phenol were applied to the nailbed and root. She tolerated this procedure well. A dry sterile compressive dressing was applied. She was given both oral and written home-going instructions for the care and soaking of her toe as well as a prescription for coronary to be applied to the tibial nail border hallux left twice daily episode. She will cover with a Band-Aid twice daily. I will follow up with her in 1 week. Should she have questions or concerns or feels that this is infected  she will notify us immediately.

## 2014-06-24 NOTE — Patient Instructions (Signed)

## 2014-06-27 ENCOUNTER — Other Ambulatory Visit: Payer: Self-pay | Admitting: Cardiology

## 2014-06-29 ENCOUNTER — Ambulatory Visit (INDEPENDENT_AMBULATORY_CARE_PROVIDER_SITE_OTHER): Payer: Medicare Other | Admitting: Psychology

## 2014-06-29 DIAGNOSIS — F319 Bipolar disorder, unspecified: Secondary | ICD-10-CM

## 2014-06-29 NOTE — Telephone Encounter (Signed)
Per note 4.5.16 

## 2014-06-30 ENCOUNTER — Other Ambulatory Visit: Payer: Self-pay

## 2014-06-30 ENCOUNTER — Telehealth: Payer: Self-pay | Admitting: Cardiology

## 2014-06-30 MED ORDER — LOSARTAN POTASSIUM 25 MG PO TABS: 25.0000 mg | ORAL_TABLET | Freq: Two times a day (BID) | ORAL | Status: DC

## 2014-06-30 NOTE — Telephone Encounter (Signed)
°  1. Which medications need to be refilled? Cozaar  2. Which pharmacy is medication to be sent to?[facility]  3. Do they need a 30 day or 90 day supply? 90 and refills  4. Would they like a call back once the medication has been sent to the pharmacy? yes

## 2014-07-01 ENCOUNTER — Ambulatory Visit: Payer: 59 | Admitting: Podiatry

## 2014-07-01 ENCOUNTER — Telehealth: Payer: Self-pay | Admitting: Cardiology

## 2014-07-01 ENCOUNTER — Encounter: Payer: Self-pay | Admitting: Podiatry

## 2014-07-01 ENCOUNTER — Ambulatory Visit (INDEPENDENT_AMBULATORY_CARE_PROVIDER_SITE_OTHER): Payer: 59 | Admitting: Podiatry

## 2014-07-01 DIAGNOSIS — L600 Ingrowing nail: Secondary | ICD-10-CM | POA: Diagnosis not present

## 2014-07-01 NOTE — Telephone Encounter (Signed)
Pt called in stating that [facility] [address] stating that her prescription is ready. She states that she receives her prescriptions through mail order and she will not be going to pick up the medication. Please resend this to [facility] as directed initially.   Thank you

## 2014-07-01 NOTE — Telephone Encounter (Signed)
Left message for patient, script was sent to [facility] yesterday.

## 2014-07-01 NOTE — Patient Instructions (Signed)

## 2014-07-01 NOTE — Progress Notes (Signed)
This patient presents today for follow-up matrixectomy. They continue to soak twice daily and apply Cortisporin otic as directed. Relating no complaints. She is also complaining of a painful ingrown nail tibial border hallux right. He would like to have this taken care of his well.  Objective: Vital signs are stable. Secondly site appears to be healing well without erythema or drainage purulence or odor. Pulses are palpable right foot sharp incurvated nail margin tibial border hallux right.  Assessment: Well-healing surgical matrixectomy without complications left. Ingrown toenail paronychia abscess hallux right.  Plan: Currently we will discontinue the use of Betadine soaks and start with Epsom salts and water twice daily. Continue the use of Cortisporin Otic and covered during the day leaving it open at night time. They will continue to soak the toe until completely well. They will continue to watch the toe for signs and symptoms of infection should any arise we will be notified immediately. Follow-up when necessary. Chemical matrixectomy was performed to the right foot today tibial border of the hallux. She tolerated this procedure well with local and aesthetic. She will start soaking this foot as well the same as she was her left foot. I will follow up with her in 1 week.

## 2014-07-06 ENCOUNTER — Ambulatory Visit (INDEPENDENT_AMBULATORY_CARE_PROVIDER_SITE_OTHER): Payer: PRIVATE HEALTH INSURANCE | Admitting: Psychology

## 2014-07-06 DIAGNOSIS — F319 Bipolar disorder, unspecified: Secondary | ICD-10-CM

## 2014-07-08 ENCOUNTER — Encounter: Payer: Self-pay | Admitting: Podiatry

## 2014-07-08 ENCOUNTER — Ambulatory Visit (INDEPENDENT_AMBULATORY_CARE_PROVIDER_SITE_OTHER): Payer: Medicare Other | Admitting: Podiatry

## 2014-07-08 DIAGNOSIS — L600 Ingrowing nail: Secondary | ICD-10-CM

## 2014-07-08 NOTE — Progress Notes (Signed)
This patient presents today for follow-up matrixectomy. They continue to soak twice daily and apply Cortisporin otic as directed. Relating no complaints.  Objective: Vital signs are stable. Secondly site appears to be healing well without erythema or drainage purulence or odor.  Assessment: Well-healing surgical matrixectomy without complications.  Plan: Currently we will discontinue the use of Betadine soaks and start with Epsom salts and water twice daily. Continue the use of Cortisporin Otic and covered during the day leaving it open at night time. They will continue to soak the toe until completely well. They will continue to watch the toe for signs and symptoms of infection should any arise we will be notified immediately. Follow-up when necessary.  

## 2014-07-13 ENCOUNTER — Ambulatory Visit (INDEPENDENT_AMBULATORY_CARE_PROVIDER_SITE_OTHER): Payer: PRIVATE HEALTH INSURANCE | Admitting: Psychology

## 2014-07-13 DIAGNOSIS — F319 Bipolar disorder, unspecified: Secondary | ICD-10-CM

## 2014-07-20 ENCOUNTER — Ambulatory Visit (INDEPENDENT_AMBULATORY_CARE_PROVIDER_SITE_OTHER): Payer: PRIVATE HEALTH INSURANCE | Admitting: Psychology

## 2014-07-20 DIAGNOSIS — F319 Bipolar disorder, unspecified: Secondary | ICD-10-CM

## 2014-07-27 ENCOUNTER — Ambulatory Visit (INDEPENDENT_AMBULATORY_CARE_PROVIDER_SITE_OTHER): Payer: PRIVATE HEALTH INSURANCE | Admitting: Psychology

## 2014-07-27 DIAGNOSIS — F319 Bipolar disorder, unspecified: Secondary | ICD-10-CM | POA: Diagnosis not present

## 2014-08-05 ENCOUNTER — Ambulatory Visit (INDEPENDENT_AMBULATORY_CARE_PROVIDER_SITE_OTHER): Payer: PRIVATE HEALTH INSURANCE | Admitting: Psychology

## 2014-08-05 ENCOUNTER — Ambulatory Visit: Payer: PRIVATE HEALTH INSURANCE | Admitting: Psychology

## 2014-08-05 DIAGNOSIS — F319 Bipolar disorder, unspecified: Secondary | ICD-10-CM | POA: Diagnosis not present

## 2014-08-10 ENCOUNTER — Ambulatory Visit (INDEPENDENT_AMBULATORY_CARE_PROVIDER_SITE_OTHER): Payer: PRIVATE HEALTH INSURANCE | Admitting: Psychology

## 2014-08-10 DIAGNOSIS — F319 Bipolar disorder, unspecified: Secondary | ICD-10-CM

## 2014-08-17 ENCOUNTER — Ambulatory Visit (INDEPENDENT_AMBULATORY_CARE_PROVIDER_SITE_OTHER): Payer: PRIVATE HEALTH INSURANCE | Admitting: Psychology

## 2014-08-17 DIAGNOSIS — F319 Bipolar disorder, unspecified: Secondary | ICD-10-CM | POA: Diagnosis not present

## 2014-08-24 ENCOUNTER — Ambulatory Visit: Payer: PRIVATE HEALTH INSURANCE | Admitting: Psychology

## 2014-08-25 ENCOUNTER — Ambulatory Visit (INDEPENDENT_AMBULATORY_CARE_PROVIDER_SITE_OTHER): Payer: PRIVATE HEALTH INSURANCE | Admitting: Psychology

## 2014-08-25 DIAGNOSIS — F319 Bipolar disorder, unspecified: Secondary | ICD-10-CM

## 2014-08-31 ENCOUNTER — Ambulatory Visit (INDEPENDENT_AMBULATORY_CARE_PROVIDER_SITE_OTHER): Payer: PRIVATE HEALTH INSURANCE | Admitting: Psychology

## 2014-08-31 DIAGNOSIS — F319 Bipolar disorder, unspecified: Secondary | ICD-10-CM

## 2014-09-02 ENCOUNTER — Other Ambulatory Visit: Payer: Self-pay | Admitting: Family Medicine

## 2014-09-07 ENCOUNTER — Ambulatory Visit (INDEPENDENT_AMBULATORY_CARE_PROVIDER_SITE_OTHER): Payer: PRIVATE HEALTH INSURANCE | Admitting: Psychology

## 2014-09-07 DIAGNOSIS — F319 Bipolar disorder, unspecified: Secondary | ICD-10-CM

## 2014-09-14 ENCOUNTER — Ambulatory Visit (INDEPENDENT_AMBULATORY_CARE_PROVIDER_SITE_OTHER): Payer: PRIVATE HEALTH INSURANCE | Admitting: Psychology

## 2014-09-14 DIAGNOSIS — F319 Bipolar disorder, unspecified: Secondary | ICD-10-CM | POA: Diagnosis not present

## 2014-09-21 ENCOUNTER — Ambulatory Visit (INDEPENDENT_AMBULATORY_CARE_PROVIDER_SITE_OTHER): Payer: PRIVATE HEALTH INSURANCE | Admitting: Psychology

## 2014-09-21 DIAGNOSIS — F319 Bipolar disorder, unspecified: Secondary | ICD-10-CM

## 2014-09-28 ENCOUNTER — Ambulatory Visit (INDEPENDENT_AMBULATORY_CARE_PROVIDER_SITE_OTHER): Payer: PRIVATE HEALTH INSURANCE | Admitting: Psychology

## 2014-09-28 DIAGNOSIS — F319 Bipolar disorder, unspecified: Secondary | ICD-10-CM

## 2014-09-29 DIAGNOSIS — Z1231 Encounter for screening mammogram for malignant neoplasm of breast: Secondary | ICD-10-CM | POA: Diagnosis not present

## 2014-09-29 DIAGNOSIS — Z3040 Encounter for surveillance of contraceptives, unspecified: Secondary | ICD-10-CM | POA: Diagnosis not present

## 2014-09-29 DIAGNOSIS — Z01419 Encounter for gynecological examination (general) (routine) without abnormal findings: Secondary | ICD-10-CM | POA: Diagnosis not present

## 2014-09-29 DIAGNOSIS — Z6841 Body Mass Index (BMI) 40.0 and over, adult: Secondary | ICD-10-CM | POA: Diagnosis not present

## 2014-10-05 ENCOUNTER — Ambulatory Visit (INDEPENDENT_AMBULATORY_CARE_PROVIDER_SITE_OTHER): Payer: PRIVATE HEALTH INSURANCE | Admitting: Psychology

## 2014-10-05 DIAGNOSIS — F319 Bipolar disorder, unspecified: Secondary | ICD-10-CM

## 2014-10-12 ENCOUNTER — Ambulatory Visit (INDEPENDENT_AMBULATORY_CARE_PROVIDER_SITE_OTHER): Payer: PRIVATE HEALTH INSURANCE | Admitting: Psychology

## 2014-10-12 DIAGNOSIS — F319 Bipolar disorder, unspecified: Secondary | ICD-10-CM

## 2014-10-14 ENCOUNTER — Ambulatory Visit (INDEPENDENT_AMBULATORY_CARE_PROVIDER_SITE_OTHER): Payer: 59 | Admitting: Family Medicine

## 2014-10-14 ENCOUNTER — Encounter: Payer: Self-pay | Admitting: Family Medicine

## 2014-10-14 VITALS — BP 126/82 | HR 99 | Temp 98.8°F | Ht 65.0 in

## 2014-10-14 DIAGNOSIS — I5022 Chronic systolic (congestive) heart failure: Secondary | ICD-10-CM

## 2014-10-14 DIAGNOSIS — R7303 Prediabetes: Secondary | ICD-10-CM

## 2014-10-14 DIAGNOSIS — G4733 Obstructive sleep apnea (adult) (pediatric): Secondary | ICD-10-CM

## 2014-10-14 DIAGNOSIS — E669 Obesity, unspecified: Secondary | ICD-10-CM

## 2014-10-14 DIAGNOSIS — E785 Hyperlipidemia, unspecified: Secondary | ICD-10-CM | POA: Diagnosis not present

## 2014-10-14 DIAGNOSIS — E662 Morbid (severe) obesity with alveolar hypoventilation: Secondary | ICD-10-CM

## 2014-10-14 DIAGNOSIS — F331 Major depressive disorder, recurrent, moderate: Secondary | ICD-10-CM

## 2014-10-14 DIAGNOSIS — I10 Essential (primary) hypertension: Secondary | ICD-10-CM

## 2014-10-14 DIAGNOSIS — R7309 Other abnormal glucose: Secondary | ICD-10-CM

## 2014-10-14 DIAGNOSIS — I429 Cardiomyopathy, unspecified: Secondary | ICD-10-CM

## 2014-10-14 MED ORDER — LIDOCAINE 5 % EX PTCH: 1.0000 | MEDICATED_PATCH | Freq: Every day | CUTANEOUS | Status: DC | PRN

## 2014-10-14 NOTE — Progress Notes (Signed)
Pre visit review using our clinic review tool, if applicable. No additional management support is needed unless otherwise documented below in the visit note. 

## 2014-10-14 NOTE — Patient Instructions (Addendum)
BEFORE YOU LEAVE: -labs -follow up in 4 months  Follow up with cardiologist about lasix dosing  We recommend the following healthy lifestyle measures: - eat a healthy diet consisting of small portions of vegetables, fruits, beans, nuts, seeds, healthy meats such as white chicken and fish  - avoid sweets, white starches, fried foods, fast food, processed foods, sodas, red meet and other fattening foods.  - get a least 150 minutes of aerobic exercise per week.

## 2014-10-14 NOTE — Progress Notes (Signed)
HPI:  [name] is a 44 yo F with a complicated PMH (seeing many specialists when she established with me) here for follow up:   DM/morbidly obese: -meds: asa, metformin 500mg  bid, arb -diet and exercise: poor, advised -home BS: does not do -least eye exam: yearly  -complications: non -denies: hypoglycemia, wounds, vision changes -advised fasting labs at last visit   Asthma, OSA, Obesity hypoventilation syndrome: -seeing pulmonologist for this -per review of recent notes pulmonologist advised "Advised against medications with sedative side effects" -saw allergist in the past -has chronic eustachian tube dysfunction -on CPAP  Bipolar disorder, agorhaphobic, Anxiety, hx psychotic episodes: -on an EXTENSIVE list of psych medication with sedative side effects with Dr. Toy Care - reports Dr. Toy Care checks labs frequently -meds: lyrica, latuda, lamictal, carbamazepine, methylphenidate, alprazolam, clonazepam, tramadol -on disability for psychiatric issues -has been stable on these medications per her report for at least 3 years and is aware of sig risks and interactions and is considering [facility] and ECT -I advised discussing simplifying medications as possible with psychiatrist due to concerns for polypharmacy  CHF, cardiomyopathy, HTN, ?HLD: -reports has mild CHF and sees Dr. Warren Lacy for this -on cozaar and lasix for this  -reports episode a few weeks ago when she decreased lasix to 3 pills instead of 4 daily and had some SOB - resolved with increasing lasix back to 4 pills daily -denies: CP, palpitations, increased swelling -reports had labs 12/2013 and told cholesterol normal  OPLL: -s/p neurosurgery, Dr. Vertell Limber  -advised follow up if recurs per her report  Chronic Knee pain: -reports Dr. Vertell Limber gave her lidocaine patches for this that she only uses 3 times per month so did not need refill since 2013, helps her get around when knee pain is back, wants refill  ROS: See  pertinent positives and negatives per HPI.  Past Medical History  Diagnosis Date  . Morbid obesity   . Obesity hypoventilation syndrome   . Bipolar disorder   . Hypoxemia     history of - no home O2  . Cor pulmonale   . History of pleural effusion   . Spinal stenosis in cervical region   . Acute systolic congestive heart failure   . Seasonal allergies     current runny nose  . Arthritis     osteoarthritis bilal. knees  . Urinary incontinence   . Abrasion of breast 06/21/2011    left  . Sleep apnea sleep study 07/02/2010    uses CPAP nightly  . Asthma     no per PFT 7/13; reports does not have asthma  . Seizures     febrile seizure x 1 as a child. Several times  . IBS (irritable bowel syndrome)   . Carpal tunnel syndrome of left wrist 05/2011    Being evaluated for MS  . Prediabetes   . Agoraphobia   . Hyperlipidemia   . Hypertension   . Depression   . Anxiety     panic attacks  . Allergy     Past Surgical History  Procedure Laterality Date  . Wisdom tooth extraction    . Mass excision  08/07/2010    right index  . Carpal tunnel release  06/27/2011    Procedure: CARPAL TUNNEL RELEASE;  Surgeon: Wynonia Sours, MD;  Location: [facility];  Service: Orthopedics;  Laterality: Left;  . Posterior cervical fusion/foraminotomy  09/11/2011    Procedure: POSTERIOR CERVICAL FUSION/FORAMINOTOMY LEVEL 1;  Surgeon: Erline Levine, MD;  Location:  [facility];  Service: Neurosurgery;  Laterality: N/A;  Cervical Three-Four Posteior Cervical Fusion and Decompression.  . Carpal tunnel release  12/19/2011    Procedure: CARPAL TUNNEL RELEASE;  Surgeon: Wynonia Sours, MD;  Location: [facility];  Service: Orthopedics;  Laterality: Right;  . Trigger finger release  12/19/2011    Procedure: RELEASE TRIGGER FINGER/A-1 PULLEY;  Surgeon: Wynonia Sours, MD;  Location: [facility];  Service: Orthopedics;  Laterality: Right;  . Trigger finger release Left 12/31/2012     Procedure: RELEASE A-1 PULLEY LEFT THUMB;  Surgeon: Wynonia Sours, MD;  Location: [facility];  Service: Orthopedics;  Laterality: Left;    Family History  Problem Relation Age of Onset  . Asthma Mother   . Allergic rhinitis Mother   . Multiple sclerosis Mother   . Asthma Maternal Grandmother     Social History   Social History  . Marital Status: Married    Spouse Name: N/A  . Number of Children: N/A  . Years of Education: N/A   Occupational History  . currently unemployed    Social History Main Topics  . Smoking status: Never Smoker   . Smokeless tobacco: Never Used  . Alcohol Use: Yes     Comment: rare  . Drug Use: No  . Sexual Activity: Not Asked   Other Topics Concern  . None   Social History Narrative   Lives in [place]           Current outpatient prescriptions:  .  acetaminophen (TYLENOL) 500 MG tablet, Take 2,000 mg by mouth 2 (two) times daily. , Disp: , Rfl:  .  ALPRAZolam (XANAX) 1 MG tablet, Take 1 mg by mouth 3 (three) times daily. , Disp: , Rfl:  .  Ascorbic Acid (VITAMIN C) 500 MG tablet, Take 1,000 mg by mouth daily. , Disp: , Rfl:  .  aspirin 81 MG tablet, Take 81 mg by mouth every other day. , Disp: , Rfl:  .  carbamazepine (TEGRETOL) 200 MG tablet, Take 1 tablet in the morning and 2 tablets at night, Disp: , Rfl:  .  Cholecalciferol (VITAMIN D3) 5000 UNITS TABS, Take 3 tablets by mouth daily. , Disp: , Rfl:  .  Chromium Picolinate 500 MCG CAPS, Take 1 capsule by mouth daily., Disp: , Rfl:  .  clonazePAM (KLONOPIN) 1 MG tablet, Take 1 mg by mouth 4 (four) times daily. , Disp: , Rfl:  .  furosemide (LASIX) 40 MG tablet, TAKE 2 TABLETS EVERY       MORNING AND TAKE 1 TABLET  EVERY EVENING, Disp: 270 tablet, Rfl: 0 .  furosemide (LASIX) 40 MG tablet, TAKE 2 TABLETS EVERY       MORNING AND 1 TABLET EVERY EVENING, Disp: 270 tablet, Rfl: 3 .  KLOR-CON M20 20 MEQ tablet, TAKE 1 TABLET DAILY, Disp: 90 tablet, Rfl: 3 .  lamoTRIgine  (LAMICTAL) 200 MG tablet, Take 200 mg by mouth 2 (two) times daily. , Disp: , Rfl:  .  lidocaine (LIDODERM) 5 %, Place 1 patch onto the skin daily as needed (for knee pain). Remove & Discard patch within 12 hours or as directed by MD, Disp: 30 patch, Rfl: 0 .  losartan (COZAAR) 25 MG tablet, Take 1 tablet (25 mg total) by mouth 2 (two) times daily., Disp: 180 tablet, Rfl: 3 .  Lurasidone HCl (LATUDA) 120 MG TABS, Take 1 tablet by mouth daily., Disp: , Rfl:  .  LYRICA 300 MG capsule, Take 1 tablet by mouth Twice daily., Disp: , Rfl:  .  MAGNESIUM PO, Take 1 tablet by mouth daily., Disp: , Rfl:  .  Meloxicam (MOBIC PO), Take 7.5 mg by mouth every 12 (twelve) hours. , Disp: , Rfl:  .  metFORMIN (GLUCOPHAGE) 500 MG tablet, TAKE 1 TABLET BY MOUTH TWICE DAILY WITH MEALS, Disp: 180 tablet, Rfl: 1 .  methylphenidate (RITALIN) 10 MG tablet, 5-20 mg. Takes 20 mg morning and noon, and 5-10 mg at 4pm, Disp: , Rfl:  .  mometasone (NASONEX) 50 MCG/ACT nasal spray, USE 2 SPRAYS NASALLY DAILY, Disp: 17 g, Rfl: 1 .  Multiple Vitamin (MULTIVITAMIN) tablet, Take 1 tablet by mouth daily.  , Disp: , Rfl:  .  norethindrone-ethinyl estradiol-iron (MICROGESTIN FE 1.5/30) 1.5-30 MG-MCG tablet, Take 1 tablet by mouth at bedtime. Takes continuously, Disp: , Rfl:  .  VOLTAREN 1 % GEL, Apply three times daily as needed, Disp: , Rfl:  .  Zinc 50 MG CAPS, Take 50 mg by mouth daily., Disp: , Rfl:   EXAM:  Filed Vitals:   10/14/14 1620  BP: 126/82  Pulse:   Temp:     There is no weight on file to calculate BMI.  GENERAL: vitals reviewed and listed above, alert, oriented, appears well hydrated and in no acute distress  HEENT: atraumatic, conjunttiva clear, no obvious abnormalities on inspection of external nose and ears  NECK: no obvious masses on inspection  LUNGS: clear to auscultation bilaterally, no wheezes, rales or rhonchi, good air movement  CV: HRRR, no peripheral edema  MS: moves all extremities without  noticeable abnormality  PSYCH: pleasant and cooperative, no obvious depression or anxiety  ASSESSMENT AND PLAN:  Discussed the following assessment and plan:  Prediabetes - Plan: Hemoglobin A1c  Hyperlipidemia  Essential hypertension  Obesity hypoventilation syndrome  OSA (obstructive sleep apnea)  Cardiomyopathy  Chronic systolic heart failure   Obesity  Major depressive disorder, recurrent episode, moderate  -hgba1c -foot exam -lifestyle recs -advised vaccines - she refused -advised follow up with cardiologist about lasix dosing, BP great on recheck here today  -Patient advised to return or notify a doctor immediately if symptoms worsen or persist or new concerns arise.  Patient Instructions  BEFORE YOU LEAVE: -labs -follow up in 4 months  Follow up with cardiologist about lasix dosing  We recommend the following healthy lifestyle measures: - eat a healthy diet consisting of small portions of vegetables, fruits, beans, nuts, seeds, healthy meats such as white chicken and fish  - avoid sweets, white starches, fried foods, fast food, processed foods, sodas, red meet and other fattening foods.  - get a least 150 minutes of aerobic exercise per week.       Colin Benton R.

## 2014-10-15 LAB — HEMOGLOBIN A1C: Hgb A1c MFr Bld: 6.4 % (ref 4.6–6.5)

## 2014-10-18 ENCOUNTER — Other Ambulatory Visit: Payer: Self-pay | Admitting: Pulmonary Disease

## 2014-10-18 DIAGNOSIS — G4733 Obstructive sleep apnea (adult) (pediatric): Secondary | ICD-10-CM

## 2014-10-19 ENCOUNTER — Ambulatory Visit (INDEPENDENT_AMBULATORY_CARE_PROVIDER_SITE_OTHER): Payer: 59 | Admitting: Pulmonary Disease

## 2014-10-19 ENCOUNTER — Ambulatory Visit (INDEPENDENT_AMBULATORY_CARE_PROVIDER_SITE_OTHER): Payer: PRIVATE HEALTH INSURANCE | Admitting: Psychology

## 2014-10-19 ENCOUNTER — Encounter: Payer: Self-pay | Admitting: Pulmonary Disease

## 2014-10-19 VITALS — BP 132/74 | HR 98 | Ht 65.0 in | Wt 362.6 lb

## 2014-10-19 DIAGNOSIS — J4520 Mild intermittent asthma, uncomplicated: Secondary | ICD-10-CM | POA: Diagnosis not present

## 2014-10-19 DIAGNOSIS — G4733 Obstructive sleep apnea (adult) (pediatric): Secondary | ICD-10-CM | POA: Diagnosis not present

## 2014-10-19 DIAGNOSIS — F319 Bipolar disorder, unspecified: Secondary | ICD-10-CM | POA: Diagnosis not present

## 2014-10-19 NOTE — Assessment & Plan Note (Signed)
Continue CPAP 13 cm Download will be reviewed to check on pressure and compliance. Her weight is stable so I doubt that we will need to increase pressure CPAP supplies will be renewed for a year  Weight loss encouraged, compliance with goal of at least 6 hrs every night is the expectation. Advised against medications with sedative side effects Cautioned against driving when sleepy - understanding that sleepiness will vary on a day to day basis

## 2014-10-19 NOTE — Progress Notes (Signed)
   Subjective:    Patient ID: [name], female    DOB: 09-07-1970, 44 y.o.   MRN: [id number]  HPI  PCP [name]   21 WF never smoker with morbid obesity for FU of obstructive sleep apnea on CPAP 13 cm, obesity hypoventilation & 'asthma'  She also has DM-2 & bipolar -Sees Dr Toy Care every 8 wks & Cheryln Manly every week   Prior  dx with asthma has been tx in past with advair , theophylline.  Previously seen by allergist, recommended to have allergy vaccines . Declined.  allegra and nasonex added 4/13 for nasal symptoms but c/o increased asthma attacks after addition incl acute OV in 6/13 - changed regimen to Zyrtec +Chlortrimeton 6/13  UDS in chart review +marijuana 2012.  10/19/2014  Chief Complaint  Patient presents with  . Sleep Apnea    Patient does not feel like she is getting enough air on CPAP.  Current pressure is 13cm.  Pt has respironics machine, cannot download card.  Mask is fitting fine.     Annual FU   Found low satn over labor day weekend - took extra lasix & prednisone x 2 ds & improved, but has felt slight worse dyspnea since Taking Allegra for allergies  Good CPAP usage by report, Pt is wearing CPAP machine everynight x 8 hrs or more. Denies any problems w/ mask/machine  No leak  Wt  stable  She is keeping up with her psychiatry appointments  Significant tests/ events  Admitted 04/2010 for worsening dyspnea & wt gain .  ABG was 7.40/49/63 on 4 L [place]. Echo showed EF 50% with mild decrease in systolic function. BNP > 500 & CXR showed a rt pleural effusion. CT angio neg for PE, She was diuresed > 50 lbs, discharged on O2 ([facility]) .   PSG in '02 showed severe OSA  with AHI 40/h, lowest desatn to 52% correctd by 8 cm H2O . Her wt was 325 lbs then.  CPAP titration study >> required 10-16cm CPAP, higher pressure when supine/ REM sleep.  Download 07/2010 >>Good control of events on CPAP 13 cm - changed to this pressure.  12/2011 Download  -CPAP 13 cm very effective    O2 dc'd 10/12   PFTs 06/2011 - no obstruction, mild restriction, FVC 78%, DLCO 64%     Review of Systems neg for any significant sore throat, dysphagia, itching, sneezing, nasal congestion or excess/ purulent secretions, fever, chills, sweats, unintended wt loss, pleuritic or exertional cp, hempoptysis, orthopnea pnd or change in chronic leg swelling. Also denies presyncope, palpitations, heartburn, abdominal pain, nausea, vomiting, diarrhea or change in bowel or urinary habits, dysuria,hematuria, rash, arthralgias, visual complaints, headache, numbness weakness or ataxia.      Objective:   Physical Exam   Gen. Pleasant, obese, in no distress ENT - no lesions, no post nasal drip Neck: No JVD, no thyromegaly, no carotid bruits Lungs: no use of accessory muscles, no dullness to percussion, decreased without rales or rhonchi  Cardiovascular: Rhythm regular, heart sounds  normal, no murmurs or gallops, no peripheral edema Musculoskeletal: No deformities, no cyanosis or clubbing , no tremors      Assessment & Plan:

## 2014-10-19 NOTE — Assessment & Plan Note (Signed)
No evidence of flareup Continue taking Allegra

## 2014-10-19 NOTE — Patient Instructions (Signed)
We will ask [employer] to send [place] a download on your machine & ensure settings ok Call as needed

## 2014-10-21 ENCOUNTER — Telehealth: Payer: Self-pay | Admitting: Cardiology

## 2014-10-21 DIAGNOSIS — I420 Dilated cardiomyopathy: Secondary | ICD-10-CM

## 2014-10-21 NOTE — Telephone Encounter (Signed)
Pt called in stating that she finds herself taking an extra pill of Lasix. So that means she would like a new prescription written for 2 in the Am and 2 in the Pm. Could this be taken care of for her next refill. Please call  Thanks

## 2014-10-21 NOTE — Telephone Encounter (Signed)
Patient called and asked if when her next lasix refill comes due she can have it written as 2 in the morning and 2 in the evening.  Her prescription in [facility] is for Two Lasix 40 mg tablets every morning and one Lasix 40 mg tablet in the evening.  Patient said that she is taking Two lasix in the morning, another at 1p and a fourth one in the evening.  Patient said she didn't know what mg her tablets are because the bottle is so old she can't read the bottom  Said the other bottle is locked up in the safe and her husband has the key at work  Explained to her that I would send the message to Dr. Percival Spanish that you would like to increase her Lasix on her next prescription.  Told her that according to our records she has not had a BMET in  Quite awhile.  Said she had lots of lab work drawn when she saw her PCP in January.  She says she watches her weight and fluid and knows when she needs an extra lasix dose. Said that Dr. Percival Spanish told her she could have an extra dose as needed at the last office visit and several years ago.  No specific complaint besides wanting her total daily lasix dose to be changed  Please adivse thanjk you

## 2014-10-21 NOTE — Telephone Encounter (Signed)
OK to write for the higher dose.

## 2014-10-22 MED ORDER — FUROSEMIDE 40 MG PO TABS: 80.0000 mg | ORAL_TABLET | Freq: Two times a day (BID) | ORAL | Status: DC

## 2014-10-22 NOTE — Telephone Encounter (Signed)
LVMTCB

## 2014-10-22 NOTE — Telephone Encounter (Signed)
Please have her get a BMET.

## 2014-10-22 NOTE — Telephone Encounter (Signed)
Lasix ordered changed per Dr. Percival Spanish and patient notified  Lasix 40 mg, two tablets (total of 80 mg) twice a day.  24 hour Lasix dose = 160 mg

## 2014-10-26 ENCOUNTER — Ambulatory Visit: Payer: PRIVATE HEALTH INSURANCE | Admitting: Psychology

## 2014-10-26 NOTE — Telephone Encounter (Signed)
LVMTCB  Needs a BMET drawn

## 2014-10-26 NOTE — Telephone Encounter (Signed)
Called patient  She will have a BMEt drawn  Order placed and lab slip sent to her residence after address confirmation

## 2014-10-29 ENCOUNTER — Other Ambulatory Visit: Payer: Self-pay | Admitting: Family Medicine

## 2014-11-01 NOTE — Telephone Encounter (Signed)
Can this encounter be closed?

## 2014-11-02 ENCOUNTER — Ambulatory Visit (INDEPENDENT_AMBULATORY_CARE_PROVIDER_SITE_OTHER): Payer: PRIVATE HEALTH INSURANCE | Admitting: Psychology

## 2014-11-02 DIAGNOSIS — F319 Bipolar disorder, unspecified: Secondary | ICD-10-CM | POA: Diagnosis not present

## 2014-11-05 ENCOUNTER — Telehealth: Payer: Self-pay | Admitting: Pulmonary Disease

## 2014-11-05 DIAGNOSIS — G4733 Obstructive sleep apnea (adult) (pediatric): Secondary | ICD-10-CM

## 2014-11-05 NOTE — Telephone Encounter (Addendum)
  Compliance report received from Huey Romans was dated 07/20/14 - 08/18/14 Pressure: 13cm Good usage Very effective No changes  ----------------------- Spoke with pt, she was upset that the information was not from the past month, she was so upset that she put her husband on the phone to discuss with me.  He advised me that she was given a new SD card. I told him I would contact [employer] and see if they can give me the download for the past month, if not, I told him that we can wait a few weeks and get a download off of patient's new SD card.    Alverda Skeans, Spoke with [facility], she said that the respiratory therapist noted that she could not get any information off of the SD card.  Called and spoke with pt's husband and advised him that I would be in an order to have the new SD card downloaded in 4 weeks.  He was okay with this.    Order entered for new download in 4 weeks.  FYI to Dr. Elsworth Soho.

## 2014-11-08 NOTE — Telephone Encounter (Signed)
Noted  

## 2014-11-10 ENCOUNTER — Other Ambulatory Visit: Payer: Self-pay

## 2014-11-16 ENCOUNTER — Ambulatory Visit (INDEPENDENT_AMBULATORY_CARE_PROVIDER_SITE_OTHER): Payer: PRIVATE HEALTH INSURANCE | Admitting: Psychology

## 2014-11-16 DIAGNOSIS — F319 Bipolar disorder, unspecified: Secondary | ICD-10-CM

## 2014-11-23 ENCOUNTER — Ambulatory Visit (INDEPENDENT_AMBULATORY_CARE_PROVIDER_SITE_OTHER): Payer: PRIVATE HEALTH INSURANCE | Admitting: Psychology

## 2014-11-23 DIAGNOSIS — F319 Bipolar disorder, unspecified: Secondary | ICD-10-CM | POA: Diagnosis not present

## 2014-11-29 ENCOUNTER — Telehealth: Payer: Self-pay | Admitting: Family Medicine

## 2014-11-29 DIAGNOSIS — I420 Dilated cardiomyopathy: Secondary | ICD-10-CM

## 2014-11-29 NOTE — Telephone Encounter (Signed)
ok 

## 2014-11-29 NOTE — Telephone Encounter (Signed)
Pt has order in system from dr hochrein for blood work. Pt would like to have labs drawn here by nana. Can I sch?

## 2014-11-30 ENCOUNTER — Ambulatory Visit (INDEPENDENT_AMBULATORY_CARE_PROVIDER_SITE_OTHER): Payer: PRIVATE HEALTH INSURANCE | Admitting: Psychology

## 2014-11-30 ENCOUNTER — Encounter: Payer: Self-pay | Admitting: Pulmonary Disease

## 2014-11-30 ENCOUNTER — Other Ambulatory Visit (INDEPENDENT_AMBULATORY_CARE_PROVIDER_SITE_OTHER): Payer: 59

## 2014-11-30 DIAGNOSIS — I420 Dilated cardiomyopathy: Secondary | ICD-10-CM

## 2014-11-30 DIAGNOSIS — F319 Bipolar disorder, unspecified: Secondary | ICD-10-CM

## 2014-11-30 LAB — BASIC METABOLIC PANEL
BUN: 17 mg/dL (ref 6–23)
CHLORIDE: 99 meq/L (ref 96–112)
CO2: 37 meq/L — AB (ref 19–32)
Calcium: 9.7 mg/dL (ref 8.4–10.5)
Creatinine, Ser: 0.74 mg/dL (ref 0.40–1.20)
GFR: 90.19 mL/min (ref >60.00)
GLUCOSE: 101 mg/dL — AB (ref 70–99)
POTASSIUM: 4.7 meq/L (ref 3.5–5.1)
SODIUM: 144 meq/L (ref 135–145)

## 2014-11-30 NOTE — Addendum Note (Signed)
Addended by: Agnes Lawrence on: 11/30/2014 09:40 AM   Modules accepted: Orders

## 2014-11-30 NOTE — Telephone Encounter (Signed)
Orders entered and I left a detailed message at the pts cell number to call the office to schedule a lab appt.

## 2014-12-07 ENCOUNTER — Ambulatory Visit (INDEPENDENT_AMBULATORY_CARE_PROVIDER_SITE_OTHER): Payer: PRIVATE HEALTH INSURANCE | Admitting: Psychology

## 2014-12-07 DIAGNOSIS — F319 Bipolar disorder, unspecified: Secondary | ICD-10-CM | POA: Diagnosis not present

## 2014-12-14 ENCOUNTER — Ambulatory Visit: Payer: PRIVATE HEALTH INSURANCE | Admitting: Psychology

## 2014-12-16 ENCOUNTER — Telehealth: Payer: Self-pay | Admitting: Pulmonary Disease

## 2014-12-16 NOTE — Telephone Encounter (Signed)
Compliance Report - 12/02/14  Per Dr. Elsworth Soho: Good usage on 13cm No residuals Continue same

## 2014-12-16 NOTE — Telephone Encounter (Signed)
Left message to call back  

## 2014-12-17 NOTE — Telephone Encounter (Signed)
Patient notified of Compliance report results. Patient states that she is doing great on CPAP, no complaints. Nothing further needed. Closing encounter

## 2014-12-17 NOTE — Telephone Encounter (Signed)
[phone], pt cb

## 2014-12-21 ENCOUNTER — Ambulatory Visit (INDEPENDENT_AMBULATORY_CARE_PROVIDER_SITE_OTHER): Payer: PRIVATE HEALTH INSURANCE | Admitting: Psychology

## 2014-12-21 DIAGNOSIS — F319 Bipolar disorder, unspecified: Secondary | ICD-10-CM

## 2014-12-22 ENCOUNTER — Encounter: Payer: Self-pay | Admitting: Pulmonary Disease

## 2014-12-28 ENCOUNTER — Ambulatory Visit (INDEPENDENT_AMBULATORY_CARE_PROVIDER_SITE_OTHER): Payer: PRIVATE HEALTH INSURANCE | Admitting: Psychology

## 2014-12-28 DIAGNOSIS — F319 Bipolar disorder, unspecified: Secondary | ICD-10-CM | POA: Diagnosis not present

## 2014-12-29 DIAGNOSIS — L918 Other hypertrophic disorders of the skin: Secondary | ICD-10-CM | POA: Diagnosis not present

## 2014-12-29 DIAGNOSIS — L821 Other seborrheic keratosis: Secondary | ICD-10-CM | POA: Diagnosis not present

## 2014-12-29 DIAGNOSIS — D1801 Hemangioma of skin and subcutaneous tissue: Secondary | ICD-10-CM | POA: Diagnosis not present

## 2015-01-04 ENCOUNTER — Ambulatory Visit (INDEPENDENT_AMBULATORY_CARE_PROVIDER_SITE_OTHER): Payer: PRIVATE HEALTH INSURANCE | Admitting: Psychology

## 2015-01-04 DIAGNOSIS — F319 Bipolar disorder, unspecified: Secondary | ICD-10-CM | POA: Diagnosis not present

## 2015-01-11 ENCOUNTER — Ambulatory Visit (INDEPENDENT_AMBULATORY_CARE_PROVIDER_SITE_OTHER): Payer: PRIVATE HEALTH INSURANCE | Admitting: Psychology

## 2015-01-11 DIAGNOSIS — F319 Bipolar disorder, unspecified: Secondary | ICD-10-CM | POA: Diagnosis not present

## 2015-01-18 ENCOUNTER — Ambulatory Visit: Payer: PRIVATE HEALTH INSURANCE | Admitting: Psychology

## 2015-01-19 ENCOUNTER — Ambulatory Visit (INDEPENDENT_AMBULATORY_CARE_PROVIDER_SITE_OTHER): Payer: PRIVATE HEALTH INSURANCE | Admitting: Psychology

## 2015-01-19 DIAGNOSIS — F319 Bipolar disorder, unspecified: Secondary | ICD-10-CM

## 2015-01-25 ENCOUNTER — Ambulatory Visit (INDEPENDENT_AMBULATORY_CARE_PROVIDER_SITE_OTHER): Payer: PRIVATE HEALTH INSURANCE | Admitting: Psychology

## 2015-01-25 DIAGNOSIS — F319 Bipolar disorder, unspecified: Secondary | ICD-10-CM

## 2015-02-01 ENCOUNTER — Ambulatory Visit (INDEPENDENT_AMBULATORY_CARE_PROVIDER_SITE_OTHER): Payer: PRIVATE HEALTH INSURANCE | Admitting: Psychology

## 2015-02-01 DIAGNOSIS — F319 Bipolar disorder, unspecified: Secondary | ICD-10-CM | POA: Diagnosis not present

## 2015-02-08 ENCOUNTER — Ambulatory Visit (INDEPENDENT_AMBULATORY_CARE_PROVIDER_SITE_OTHER): Payer: PRIVATE HEALTH INSURANCE | Admitting: Psychology

## 2015-02-08 DIAGNOSIS — F319 Bipolar disorder, unspecified: Secondary | ICD-10-CM

## 2015-02-15 ENCOUNTER — Ambulatory Visit (INDEPENDENT_AMBULATORY_CARE_PROVIDER_SITE_OTHER): Payer: PRIVATE HEALTH INSURANCE | Admitting: Psychology

## 2015-02-15 DIAGNOSIS — F319 Bipolar disorder, unspecified: Secondary | ICD-10-CM | POA: Diagnosis not present

## 2015-02-22 ENCOUNTER — Ambulatory Visit (INDEPENDENT_AMBULATORY_CARE_PROVIDER_SITE_OTHER): Payer: PRIVATE HEALTH INSURANCE | Admitting: Psychology

## 2015-02-22 DIAGNOSIS — F319 Bipolar disorder, unspecified: Secondary | ICD-10-CM | POA: Diagnosis not present

## 2015-03-01 ENCOUNTER — Ambulatory Visit (INDEPENDENT_AMBULATORY_CARE_PROVIDER_SITE_OTHER): Payer: PRIVATE HEALTH INSURANCE | Admitting: Psychology

## 2015-03-01 DIAGNOSIS — F319 Bipolar disorder, unspecified: Secondary | ICD-10-CM | POA: Diagnosis not present

## 2015-03-08 ENCOUNTER — Ambulatory Visit: Payer: PRIVATE HEALTH INSURANCE | Admitting: Psychology

## 2015-03-22 ENCOUNTER — Ambulatory Visit (INDEPENDENT_AMBULATORY_CARE_PROVIDER_SITE_OTHER): Payer: PRIVATE HEALTH INSURANCE | Admitting: Psychology

## 2015-03-22 DIAGNOSIS — F319 Bipolar disorder, unspecified: Secondary | ICD-10-CM | POA: Diagnosis not present

## 2015-03-23 ENCOUNTER — Telehealth: Payer: Self-pay | Admitting: Family Medicine

## 2015-03-23 NOTE — Telephone Encounter (Signed)
[facility]  Patient Name: [name]  DOB: 29-Mar-1970    Initial Comment Caller states her O2 is low. She is SOB. Last time she had this she had pneumonia and CHF.    Nurse Assessment  Nurse: Harlow Mares, RN, Suanne Marker Date/Time (Eastern Time): 03/23/2015 4:31:11 PM  Confirm and document reason for call. If symptomatic, describe symptoms. You must click the next button to save text entered. ---Caller states her O2 sat is low. She is SOB. Last time she had this she had pneumonia and CHF. O2 sats are 85-94% today, she normally runs higher than this. It is usually in the high 90"s. Reports positional congestion with a slight wheeze when she lies flat. SOB helped with sitting up. She is taking mucinex. Reports that her SOB made worse with exertion, which has progressively been getting worse. No other symptoms that she is aware of (sore throat, fever, body aches). She gets sleepy with the low O2 sats and she has felt more sleepy lately. Stamina is less than usual.  Has the patient traveled out of the country within the last 30 days? ---No  Does the patient have any new or worsening symptoms? ---Yes  Will a triage be completed? ---Yes  Related visit to physician within the last 2 weeks? ---No  Does the PT have any chronic conditions? (i.e. diabetes, asthma, etc.) ---Yes  List chronic conditions. ---CHF (O2 dependent), pre-diabetic, psychiatric problems  Is the patient pregnant or possibly pregnant? (Ask all females between the ages of 54-55) ---No  Is this a behavioral health or substance abuse call? ---No     Guidelines    Guideline Title Affirmed Question Affirmed Notes  COPD Oxygen Monitoring and Hypoxia [1] MILD difficulty breathing (e.g., minimal/no SOB at rest, SOB with walking) AND [2] worse than normal    Final Disposition User   See Physician within [place], RN, Suanne Marker    Comments  Caller scheduled for appt with Dr. Colin Benton for  03/24/15 @ 2:30pm. Caller voiced understanding.   Referrals  REFERRED TO PCP OFFICE

## 2015-03-24 ENCOUNTER — Ambulatory Visit (INDEPENDENT_AMBULATORY_CARE_PROVIDER_SITE_OTHER): Payer: 59 | Admitting: Family Medicine

## 2015-03-24 ENCOUNTER — Encounter: Payer: Self-pay | Admitting: Family Medicine

## 2015-03-24 VITALS — BP 136/82 | HR 94 | Temp 99.4°F | Ht 65.0 in | Wt 370.0 lb

## 2015-03-24 DIAGNOSIS — J9601 Acute respiratory failure with hypoxia: Secondary | ICD-10-CM | POA: Diagnosis not present

## 2015-03-24 DIAGNOSIS — J209 Acute bronchitis, unspecified: Secondary | ICD-10-CM

## 2015-03-24 DIAGNOSIS — I10 Essential (primary) hypertension: Secondary | ICD-10-CM

## 2015-03-24 DIAGNOSIS — R05 Cough: Secondary | ICD-10-CM

## 2015-03-24 LAB — POCT INFLUENZA A/B
Influenza A, POC: NEGATIVE
Influenza B, POC: NEGATIVE

## 2015-03-24 MED ORDER — DOXYCYCLINE HYCLATE 100 MG PO TABS: 100.0000 mg | ORAL_TABLET | Freq: Two times a day (BID) | ORAL | Status: DC

## 2015-03-24 MED ORDER — ALBUTEROL SULFATE (2.5 MG/3ML) 0.083% IN NEBU
2.5000 mg | INHALATION_SOLUTION | Freq: Once | RESPIRATORY_TRACT | Status: AC
  Administered 2015-03-24: 2.5 mg via RESPIRATORY_TRACT

## 2015-03-24 MED ORDER — ALBUTEROL SULFATE HFA 108 (90 BASE) MCG/ACT IN AERS: 2.0000 | INHALATION_SPRAY | Freq: Four times a day (QID) | RESPIRATORY_TRACT | Status: DC | PRN

## 2015-03-24 MED ORDER — PREDNISONE 20 MG PO TABS: 40.0000 mg | ORAL_TABLET | Freq: Every day | ORAL | Status: DC

## 2015-03-24 NOTE — Patient Instructions (Addendum)
Schedule follow up on Monday  Please start the prednisone and the antibiotic today.  Use the albuterol as needed per instructions.  Seek emergency care if worsening and if symptoms do not improve.

## 2015-03-24 NOTE — Progress Notes (Addendum)
HPI:   [name] is a pleasant 45 year old female with a very complicated past medical history significant for morbid obesity, obesity hypoventilation syndrome bipolar disorder, possible asthma, cor pulmonale, obstructive sleep apnea and CHF here for an acute visit for dyspnea. She is followed by a number of specialists including pulmonology, cardiology and psychiatry. She reports she hasn't quite felt well for the last 3-4 days with chest congestion, wheezing, mild shortness of breath and a lower and O2. She monitors her O2 sats at home and they usually are around 96, however they have been around 89-90 for the last 2 days. She reports she monitors her weight daily and that her weight is actually down 4 pounds today. She also monitors her blood pressure and reports that this is been normal. She becomes extremely anxious for office visits. She denies fevers, chest pain, weight gain, increased swelling, palpitations, new body aches or sore throat.   ROS: See pertinent positives and negatives per HPI.  Past Medical History  Diagnosis Date  . Morbid obesity ([place])   . Obesity hypoventilation syndrome ([place])   . Bipolar disorder ([place])   . Hypoxemia     history of - no home O2  . Cor pulmonale ([place])   . History of pleural effusion   . Spinal stenosis in cervical region   . Acute systolic congestive heart failure ([place])   . Seasonal allergies     current runny nose  . Arthritis     osteoarthritis bilal. knees  . Urinary incontinence   . Abrasion of breast 06/21/2011    left  . Sleep apnea sleep study 07/02/2010    uses CPAP nightly  . Asthma     no per PFT 7/13; reports does not have asthma  . Seizures ([place])     febrile seizure x 1 as a child. Several times  . IBS (irritable bowel syndrome)   . Carpal tunnel syndrome of left wrist 05/2011    Being evaluated for MS  . Prediabetes   . Agoraphobia   . Hyperlipidemia   . Hypertension   . Depression   . Anxiety     panic attacks  . Allergy      Past Surgical History  Procedure Laterality Date  . Wisdom tooth extraction    . Mass excision  08/07/2010    right index  . Carpal tunnel release  06/27/2011    Procedure: CARPAL TUNNEL RELEASE;  Surgeon: Wynonia Sours, MD;  Location: [facility];  Service: Orthopedics;  Laterality: Left;  . Posterior cervical fusion/foraminotomy  09/11/2011    Procedure: POSTERIOR CERVICAL FUSION/FORAMINOTOMY LEVEL 1;  Surgeon: Erline Levine, MD;  Location: [facility];  Service: Neurosurgery;  Laterality: N/A;  Cervical Three-Four Posteior Cervical Fusion and Decompression.  . Carpal tunnel release  12/19/2011    Procedure: CARPAL TUNNEL RELEASE;  Surgeon: Wynonia Sours, MD;  Location: [facility];  Service: Orthopedics;  Laterality: Right;  . Trigger finger release  12/19/2011    Procedure: RELEASE TRIGGER FINGER/A-1 PULLEY;  Surgeon: Wynonia Sours, MD;  Location: [place] [facility];  Service: Orthopedics;  Laterality: Right;  . Trigger finger release Left 12/31/2012    Procedure: RELEASE A-1 PULLEY LEFT THUMB;  Surgeon: Wynonia Sours, MD;  Location: [facility];  Service: Orthopedics;  Laterality: Left;    Family History  Problem Relation Age of Onset  . Asthma Mother   . Allergic rhinitis Mother   . Multiple sclerosis Mother   .  Asthma Maternal Grandmother     Social History   Social History  . Marital Status: Married    Spouse Name: N/A  . Number of Children: N/A  . Years of Education: N/A   Occupational History  . currently unemployed    Social History Main Topics  . Smoking status: Never Smoker   . Smokeless tobacco: Never Used  . Alcohol Use: Yes     Comment: rare  . Drug Use: No  . Sexual Activity: Not Asked   Other Topics Concern  . None   Social History Narrative   Lives in [place]           Current outpatient prescriptions:  .  acetaminophen (TYLENOL) 500 MG tablet, Take 2,000 mg by mouth 2 (two) times daily. , Disp:  , Rfl:  .  ALPRAZolam (XANAX) 1 MG tablet, Take 1 mg by mouth 3 (three) times daily. , Disp: , Rfl:  .  Ascorbic Acid (VITAMIN C) 500 MG tablet, Take 1,000 mg by mouth daily. , Disp: , Rfl:  .  aspirin 81 MG tablet, Take 81 mg by mouth every other day. , Disp: , Rfl:  .  carbamazepine (TEGRETOL) 200 MG tablet, Take 1 tablet in the morning and 2 tablets at night, Disp: , Rfl:  .  Cholecalciferol (VITAMIN D3) 5000 UNITS TABS, Take 3 tablets by mouth daily. , Disp: , Rfl:  .  Chromium Picolinate 500 MCG CAPS, Take 1 capsule by mouth daily., Disp: , Rfl:  .  clonazePAM (KLONOPIN) 1 MG tablet, Take 1 mg by mouth 4 (four) times daily. , Disp: , Rfl:  .  furosemide (LASIX) 40 MG tablet, Take 2 tablets (80 mg total) by mouth 2 (two) times daily., Disp: 360 tablet, Rfl: 3 .  KLOR-CON M20 20 MEQ tablet, TAKE 1 TABLET DAILY, Disp: 90 tablet, Rfl: 3 .  lamoTRIgine (LAMICTAL) 200 MG tablet, Take 200 mg by mouth 2 (two) times daily. , Disp: , Rfl:  .  lidocaine (LIDODERM) 5 %, Place 1 patch onto the skin daily as needed (for knee pain). Remove & Discard patch within 12 hours or as directed by MD, Disp: 30 patch, Rfl: 0 .  losartan (COZAAR) 25 MG tablet, Take 1 tablet (25 mg total) by mouth 2 (two) times daily., Disp: 180 tablet, Rfl: 3 .  Lurasidone HCl (LATUDA) 120 MG TABS, Take 1 tablet by mouth daily., Disp: , Rfl:  .  LYRICA 300 MG capsule, Take 1 tablet by mouth Twice daily., Disp: , Rfl:  .  MAGNESIUM PO, Take 1 tablet by mouth daily., Disp: , Rfl:  .  Meloxicam (MOBIC PO), Take 7.5 mg by mouth every 12 (twelve) hours. , Disp: , Rfl:  .  metFORMIN (GLUCOPHAGE) 500 MG tablet, TAKE 1 TABLET BY MOUTH TWICE DAILY WITH MEALS, Disp: 180 tablet, Rfl: 1 .  methylphenidate (RITALIN) 10 MG tablet, 5-20 mg. Takes 20 mg morning and noon, and 5-10 mg at 4pm, Disp: , Rfl:  .  mometasone (NASONEX) 50 MCG/ACT nasal spray, USE 2 SPRAYS NASALLY DAILY, Disp: 17 g, Rfl: 1 .  Multiple Vitamin (MULTIVITAMIN) tablet, Take 1  tablet by mouth daily.  , Disp: , Rfl:  .  norethindrone-ethinyl estradiol-iron (MICROGESTIN FE 1.5/30) 1.5-30 MG-MCG tablet, Take 1 tablet by mouth at bedtime. Takes continuously, Disp: , Rfl:  .  VOLTAREN 1 % GEL, Apply three times daily as needed, Disp: , Rfl:  .  Zinc 50 MG CAPS, Take 50 mg by mouth daily.,  Disp: , Rfl:  .  albuterol (PROVENTIL HFA;VENTOLIN HFA) 108 (90 Base) MCG/ACT inhaler, Inhale 2 puffs into the lungs every 6 (six) hours as needed for wheezing or shortness of breath., Disp: 1 Inhaler, Rfl: 3 .  doxycycline (VIBRA-TABS) 100 MG tablet, Take 1 tablet (100 mg total) by mouth 2 (two) times daily., Disp: 20 tablet, Rfl: 0 .  predniSONE (DELTASONE) 20 MG tablet, Take 2 tablets (40 mg total) by mouth daily with breakfast., Disp: 8 tablet, Rfl: 0  EXAM:  Filed Vitals:   03/24/15 1439 03/24/15 1535  BP: 136/82 136/82  Pulse: 94   Temp: 99.4 F (37.4 C)     Body mass index is 61.57 kg/(m^2).  GENERAL: vitals reviewed and listed above, alert, oriented, appears well hydrated and in no acute distress  HEENT: atraumatic, conjunttiva clear, no obvious abnormalities on inspection of external nose and ears  NECK: no obvious masses on inspection  LUNGS: Scattered expiratory wheeze, appears. Appears fairly comfortable on exam despite her low oxygen, respiratory rate is normal, no signs of significant difficulty with breathing. O2 sats 89-90% on RA --> 96% on RA after albuterol neb treatment with resolution of wheezing.  CV: HRRR, no peripheral edema  MS: moves all extremities without noticeable abnormality  PSYCH: pleasant and cooperative, no obvious depression or anxiety  ASSESSMENT AND PLAN:  >40 minutes were spent with this patient with > 50 % of the time spent face to face caring for and counseling the patient. Discussed the following assessment and plan:  Acute hypoxemic respiratory failure (HCC) - Plan: albuterol (PROVENTIL) (2.5 MG/3ML) 0.083% nebulizer solution 2.5  mg, POC Influenza A/B  Acute bronchitis, unspecified organism  Essential hypertension  -we discussed possible serious and likely etiologies, workup and treatment, treatment risks and return precautions. She does not appear fluid overloaded and has actually had a weight loss per her report and she monitors her weight daily at home. Her rapid flu test was negative. She reported feeling much better after a nebulizer treatment and her O2 sats and resp exam improved significantly. Suspect bronchitis/respiratory illness given symptoms, exam findings and improvement with nebulizer treatment. -after this discussion, [place] inclined to do a chest x-ray but agreed to start treatment with an antibiotic, prednisone and albuterol with close follow-up to ensure resolution. -Advised if worsening or symptoms are not improving significantly over the next several days she is to seek medical care immediately  -Patient advised to return or notify a doctor immediately if symptoms worsen or persist or new concerns arise.  Patient Instructions  Schedule follow up on Monday  Please start the prednisone and the antibiotic today.  Use the albuterol as needed per instructions.  Seek emergency care if worsening and if symptoms do not improve.     Colin Benton R.

## 2015-03-24 NOTE — Progress Notes (Signed)
Pre visit review using our clinic review tool, if applicable. No additional management support is needed unless otherwise documented below in the visit note. 

## 2015-03-24 NOTE — Telephone Encounter (Signed)
FYI

## 2015-03-28 ENCOUNTER — Ambulatory Visit (INDEPENDENT_AMBULATORY_CARE_PROVIDER_SITE_OTHER): Payer: 59 | Admitting: Family Medicine

## 2015-03-28 ENCOUNTER — Encounter: Payer: Self-pay | Admitting: Family Medicine

## 2015-03-28 VITALS — BP 120/72 | HR 89 | Temp 99.2°F | Ht 65.0 in

## 2015-03-28 DIAGNOSIS — J9601 Acute respiratory failure with hypoxia: Secondary | ICD-10-CM

## 2015-03-28 DIAGNOSIS — J209 Acute bronchitis, unspecified: Secondary | ICD-10-CM | POA: Diagnosis not present

## 2015-03-28 NOTE — Progress Notes (Signed)
Pre visit review using our clinic review tool, if applicable. No additional management support is needed unless otherwise documented below in the visit note. 

## 2015-03-28 NOTE — Progress Notes (Signed)
HPI:  Deborrah is a 45 year old with a very complicated past medical history(listed below) here for a follow-up visit. She was seen several days ago for hypoxic respiratory failure and opted to treat at home for presumed acute bronchitis after improvement in clinic following a nebulizer treatment. She declined doing a chest x-ray so she was placed on steroids and an antibiotic to cover for bacterial infection. She reports she is feeling much better with a mild cough at times, but no shortness of breath or trouble breathing. She monitors her weight and oxygen at home in her oxygen has improved and has stayed over 90. She reports her weight has been stable. She denies chest pain, wheezing, shortness of breath, malaise or fevers.  ROS: See pertinent positives and negatives per HPI.  Past Medical History  Diagnosis Date  . Morbid obesity ([place])   . Obesity hypoventilation syndrome ([place])   . Bipolar disorder ([place])   . Hypoxemia     history of - no home O2  . Cor pulmonale ([place])   . History of pleural effusion   . Spinal stenosis in cervical region   . Acute systolic congestive heart failure ([place])   . Seasonal allergies     current runny nose  . Arthritis     osteoarthritis bilal. knees  . Urinary incontinence   . Abrasion of breast 06/21/2011    left  . Sleep apnea sleep study 07/02/2010    uses CPAP nightly  . Asthma     no per PFT 7/13; reports does not have asthma  . Seizures ([place])     febrile seizure x 1 as a child. Several times  . IBS (irritable bowel syndrome)   . Carpal tunnel syndrome of left wrist 05/2011    Being evaluated for MS  . Prediabetes   . Agoraphobia   . Hyperlipidemia   . Hypertension   . Depression   . Anxiety     panic attacks  . Allergy     Past Surgical History  Procedure Laterality Date  . Wisdom tooth extraction    . Mass excision  08/07/2010    right index  . Carpal tunnel release  06/27/2011    Procedure: CARPAL TUNNEL RELEASE;  Surgeon: Wynonia Sours,  MD;  Location: [facility];  Service: Orthopedics;  Laterality: Left;  . Posterior cervical fusion/foraminotomy  09/11/2011    Procedure: POSTERIOR CERVICAL FUSION/FORAMINOTOMY LEVEL 1;  Surgeon: Erline Levine, MD;  Location: [facility];  Service: Neurosurgery;  Laterality: N/A;  Cervical Three-Four Posteior Cervical Fusion and Decompression.  . Carpal tunnel release  12/19/2011    Procedure: CARPAL TUNNEL RELEASE;  Surgeon: Wynonia Sours, MD;  Location: [facility];  Service: Orthopedics;  Laterality: Right;  . Trigger finger release  12/19/2011    Procedure: RELEASE TRIGGER FINGER/A-1 PULLEY;  Surgeon: Wynonia Sours, MD;  Location: [facility];  Service: Orthopedics;  Laterality: Right;  . Trigger finger release Left 12/31/2012    Procedure: RELEASE A-1 PULLEY LEFT THUMB;  Surgeon: Wynonia Sours, MD;  Location: [facility];  Service: Orthopedics;  Laterality: Left;    Family History  Problem Relation Age of Onset  . Asthma Mother   . Allergic rhinitis Mother   . Multiple sclerosis Mother   . Asthma Maternal Grandmother     Social History   Social History  . Marital Status: Married    Spouse Name: N/A  . Number of Children: N/A  .  Years of Education: N/A   Occupational History  . currently unemployed    Social History Main Topics  . Smoking status: Never Smoker   . Smokeless tobacco: Never Used  . Alcohol Use: Yes     Comment: rare  . Drug Use: No  . Sexual Activity: Not Asked   Other Topics Concern  . None   Social History Narrative   Lives in [place]           Current outpatient prescriptions:  .  acetaminophen (TYLENOL) 500 MG tablet, Take 2,000 mg by mouth 2 (two) times daily. , Disp: , Rfl:  .  albuterol (PROVENTIL HFA;VENTOLIN HFA) 108 (90 Base) MCG/ACT inhaler, Inhale 2 puffs into the lungs every 6 (six) hours as needed for wheezing or shortness of breath., Disp: 1 Inhaler, Rfl: 3 .  ALPRAZolam (XANAX) 1  MG tablet, Take 1 mg by mouth 3 (three) times daily. , Disp: , Rfl:  .  Ascorbic Acid (VITAMIN C) 500 MG tablet, Take 1,000 mg by mouth daily. , Disp: , Rfl:  .  aspirin 81 MG tablet, Take 81 mg by mouth daily. , Disp: , Rfl:  .  carbamazepine (TEGRETOL) 200 MG tablet, Take 200 mg by mouth at bedtime. Take 1 tablet in the morning and 2 tablets at night, Disp: , Rfl:  .  Cholecalciferol (VITAMIN D3) 5000 UNITS TABS, Take 3 tablets by mouth daily. , Disp: , Rfl:  .  Chromium Picolinate 500 MCG CAPS, Take 1 capsule by mouth daily., Disp: , Rfl:  .  clonazePAM (KLONOPIN) 1 MG tablet, Take 1 mg by mouth 4 (four) times daily. , Disp: , Rfl:  .  doxycycline (VIBRA-TABS) 100 MG tablet, Take 1 tablet (100 mg total) by mouth 2 (two) times daily., Disp: 20 tablet, Rfl: 0 .  furosemide (LASIX) 40 MG tablet, Take 2 tablets (80 mg total) by mouth 2 (two) times daily., Disp: 360 tablet, Rfl: 3 .  KLOR-CON M20 20 MEQ tablet, TAKE 1 TABLET DAILY, Disp: 90 tablet, Rfl: 3 .  lamoTRIgine (LAMICTAL) 200 MG tablet, Take 200 mg by mouth 2 (two) times daily. , Disp: , Rfl:  .  lidocaine (LIDODERM) 5 %, Place 1 patch onto the skin daily as needed (for knee pain). Remove & Discard patch within 12 hours or as directed by MD, Disp: 30 patch, Rfl: 0 .  losartan (COZAAR) 25 MG tablet, Take 1 tablet (25 mg total) by mouth 2 (two) times daily., Disp: 180 tablet, Rfl: 3 .  Lurasidone HCl (LATUDA) 120 MG TABS, Take 1 tablet by mouth daily., Disp: , Rfl:  .  LYRICA 300 MG capsule, Take 1 tablet by mouth Twice daily., Disp: , Rfl:  .  MAGNESIUM PO, Take 1 tablet by mouth daily., Disp: , Rfl:  .  Meloxicam (MOBIC PO), Take 7.5 mg by mouth every 12 (twelve) hours. , Disp: , Rfl:  .  metFORMIN (GLUCOPHAGE) 500 MG tablet, TAKE 1 TABLET BY MOUTH TWICE DAILY WITH MEALS, Disp: 180 tablet, Rfl: 1 .  methylphenidate (RITALIN) 10 MG tablet, 5-20 mg. Takes 20 mg morning and noon, and 5-10 mg at 4pm, Disp: , Rfl:  .  mometasone (NASONEX) 50  MCG/ACT nasal spray, USE 2 SPRAYS NASALLY DAILY (Patient taking differently: daily as needed), Disp: 17 g, Rfl: 1 .  Multiple Vitamin (MULTIVITAMIN) tablet, Take 1 tablet by mouth daily.  , Disp: , Rfl:  .  norethindrone-ethinyl estradiol-iron (MICROGESTIN FE 1.5/30) 1.5-30 MG-MCG tablet, Take 1 tablet by  mouth at bedtime. Takes continuously, Disp: , Rfl:  .  VOLTAREN 1 % GEL, Apply three times daily as needed, Disp: , Rfl:  .  Zinc 50 MG CAPS, Take 50 mg by mouth daily., Disp: , Rfl:   EXAM:  Filed Vitals:   03/28/15 1444  BP: 120/72  Pulse: 89  Temp: 99.2 F (37.3 C)    There is no weight on file to calculate BMI.  GENERAL: vitals reviewed and listed above, alert, oriented, appears well hydrated and in no acute distress  HEENT: atraumatic, conjunttiva clear, no obvious abnormalities on inspection of external nose and ears  NECK: no obvious masses on inspection  LUNGS: clear to auscultation bilaterally, no wheezes, rales or rhonchi, good air movement  CV: HRRR, no peripheral edema  MS: moves all extremities without noticeable abnormality  PSYCH: pleasant and cooperative, no obvious depression or anxiety  ASSESSMENT AND PLAN:  Discussed the following assessment and plan:  Acute bronchitis, unspecified organism  Acute respiratory failure with hypoxia (HCC)  -significant improvement in do suspect viral or bacterial respiratory infection as the cause of her symptoms -Advise she continue to monitor closely at home and follow up if any concerns or worsening or if does not continue to improve   Patient Instructions  BEFORE YOU LEAVE:  -weight  Please complete the antibiotic.  Use the albuterol as needed.  Please follow up if you are having trouble breathing or other concerns.     Colin Benton R.

## 2015-03-28 NOTE — Patient Instructions (Addendum)
BEFORE YOU LEAVE:  -weight  Please complete the antibiotic.  Use the albuterol as needed.  Please follow up if you are having trouble breathing or other concerns.

## 2015-03-28 NOTE — Progress Notes (Signed)
Patient declines weight measurement today. 

## 2015-03-29 ENCOUNTER — Ambulatory Visit (INDEPENDENT_AMBULATORY_CARE_PROVIDER_SITE_OTHER): Payer: PRIVATE HEALTH INSURANCE | Admitting: Psychology

## 2015-03-29 DIAGNOSIS — F319 Bipolar disorder, unspecified: Secondary | ICD-10-CM | POA: Diagnosis not present

## 2015-04-12 ENCOUNTER — Ambulatory Visit (INDEPENDENT_AMBULATORY_CARE_PROVIDER_SITE_OTHER): Payer: PRIVATE HEALTH INSURANCE | Admitting: Psychology

## 2015-04-12 DIAGNOSIS — F319 Bipolar disorder, unspecified: Secondary | ICD-10-CM

## 2015-04-19 ENCOUNTER — Ambulatory Visit (INDEPENDENT_AMBULATORY_CARE_PROVIDER_SITE_OTHER): Payer: PRIVATE HEALTH INSURANCE | Admitting: Psychology

## 2015-04-19 DIAGNOSIS — F319 Bipolar disorder, unspecified: Secondary | ICD-10-CM

## 2015-04-26 ENCOUNTER — Ambulatory Visit (INDEPENDENT_AMBULATORY_CARE_PROVIDER_SITE_OTHER): Payer: PRIVATE HEALTH INSURANCE | Admitting: Psychology

## 2015-04-26 DIAGNOSIS — F319 Bipolar disorder, unspecified: Secondary | ICD-10-CM | POA: Diagnosis not present

## 2015-05-03 ENCOUNTER — Ambulatory Visit (INDEPENDENT_AMBULATORY_CARE_PROVIDER_SITE_OTHER): Payer: Medicare Other | Admitting: Family Medicine

## 2015-05-03 ENCOUNTER — Telehealth: Payer: Self-pay | Admitting: *Deleted

## 2015-05-03 ENCOUNTER — Encounter: Payer: Self-pay | Admitting: *Deleted

## 2015-05-03 ENCOUNTER — Emergency Department (HOSPITAL_COMMUNITY): Payer: 59

## 2015-05-03 ENCOUNTER — Encounter (HOSPITAL_COMMUNITY): Payer: Self-pay

## 2015-05-03 ENCOUNTER — Ambulatory Visit: Payer: PRIVATE HEALTH INSURANCE | Admitting: Psychology

## 2015-05-03 ENCOUNTER — Telehealth: Payer: Self-pay | Admitting: Family Medicine

## 2015-05-03 ENCOUNTER — Inpatient Hospital Stay (HOSPITAL_COMMUNITY)
Admission: EM | Admit: 2015-05-03 | Discharge: 2015-05-06 | DRG: 193 | Disposition: A | Discharge status: Home / Self Care | Payer: 59 | Attending: Internal Medicine | Admitting: Internal Medicine

## 2015-05-03 ENCOUNTER — Encounter: Payer: Self-pay | Admitting: Family Medicine

## 2015-05-03 VITALS — BP 132/80 | HR 90 | Temp 99.9°F | Ht 65.0 in

## 2015-05-03 DIAGNOSIS — F319 Bipolar disorder, unspecified: Secondary | ICD-10-CM

## 2015-05-03 DIAGNOSIS — E662 Morbid (severe) obesity with alveolar hypoventilation: Secondary | ICD-10-CM

## 2015-05-03 DIAGNOSIS — Z9989 Dependence on other enabling machines and devices: Secondary | ICD-10-CM

## 2015-05-03 DIAGNOSIS — E119 Type 2 diabetes mellitus without complications: Secondary | ICD-10-CM | POA: Diagnosis present

## 2015-05-03 DIAGNOSIS — I5042 Chronic combined systolic (congestive) and diastolic (congestive) heart failure: Secondary | ICD-10-CM | POA: Diagnosis present

## 2015-05-03 DIAGNOSIS — I272 Other secondary pulmonary hypertension: Secondary | ICD-10-CM | POA: Diagnosis present

## 2015-05-03 DIAGNOSIS — J4520 Mild intermittent asthma, uncomplicated: Secondary | ICD-10-CM | POA: Diagnosis not present

## 2015-05-03 DIAGNOSIS — Z7982 Long term (current) use of aspirin: Secondary | ICD-10-CM

## 2015-05-03 DIAGNOSIS — E785 Hyperlipidemia, unspecified: Secondary | ICD-10-CM | POA: Diagnosis present

## 2015-05-03 DIAGNOSIS — J45909 Unspecified asthma, uncomplicated: Secondary | ICD-10-CM | POA: Diagnosis present

## 2015-05-03 DIAGNOSIS — Z981 Arthrodesis status: Secondary | ICD-10-CM

## 2015-05-03 DIAGNOSIS — D45 Polycythemia vera: Secondary | ICD-10-CM | POA: Diagnosis present

## 2015-05-03 DIAGNOSIS — J9621 Acute and chronic respiratory failure with hypoxia: Secondary | ICD-10-CM | POA: Diagnosis not present

## 2015-05-03 DIAGNOSIS — R0600 Dyspnea, unspecified: Secondary | ICD-10-CM | POA: Diagnosis not present

## 2015-05-03 DIAGNOSIS — J9620 Acute and chronic respiratory failure, unspecified whether with hypoxia or hypercapnia: Secondary | ICD-10-CM | POA: Diagnosis present

## 2015-05-03 DIAGNOSIS — I110 Hypertensive heart disease with heart failure: Secondary | ICD-10-CM | POA: Diagnosis present

## 2015-05-03 DIAGNOSIS — I5022 Chronic systolic (congestive) heart failure: Secondary | ICD-10-CM

## 2015-05-03 DIAGNOSIS — F419 Anxiety disorder, unspecified: Secondary | ICD-10-CM | POA: Diagnosis present

## 2015-05-03 DIAGNOSIS — R7881 Bacteremia: Secondary | ICD-10-CM | POA: Diagnosis present

## 2015-05-03 DIAGNOSIS — Z6841 Body Mass Index (BMI) 40.0 and over, adult: Secondary | ICD-10-CM

## 2015-05-03 DIAGNOSIS — Z79899 Other long term (current) drug therapy: Secondary | ICD-10-CM

## 2015-05-03 DIAGNOSIS — R0602 Shortness of breath: Secondary | ICD-10-CM

## 2015-05-03 DIAGNOSIS — F411 Generalized anxiety disorder: Secondary | ICD-10-CM | POA: Diagnosis present

## 2015-05-03 DIAGNOSIS — I429 Cardiomyopathy, unspecified: Secondary | ICD-10-CM

## 2015-05-03 DIAGNOSIS — J9622 Acute and chronic respiratory failure with hypercapnia: Secondary | ICD-10-CM | POA: Diagnosis present

## 2015-05-03 DIAGNOSIS — F32A Depression, unspecified: Secondary | ICD-10-CM | POA: Diagnosis present

## 2015-05-03 DIAGNOSIS — E1169 Type 2 diabetes mellitus with other specified complication: Secondary | ICD-10-CM

## 2015-05-03 DIAGNOSIS — J189 Pneumonia, unspecified organism: Principal | ICD-10-CM | POA: Diagnosis present

## 2015-05-03 DIAGNOSIS — Z7984 Long term (current) use of oral hypoglycemic drugs: Secondary | ICD-10-CM

## 2015-05-03 DIAGNOSIS — G4733 Obstructive sleep apnea (adult) (pediatric): Secondary | ICD-10-CM | POA: Diagnosis present

## 2015-05-03 DIAGNOSIS — F329 Major depressive disorder, single episode, unspecified: Secondary | ICD-10-CM | POA: Diagnosis present

## 2015-05-03 DIAGNOSIS — I2781 Cor pulmonale (chronic): Secondary | ICD-10-CM | POA: Diagnosis present

## 2015-05-03 DIAGNOSIS — I10 Essential (primary) hypertension: Secondary | ICD-10-CM | POA: Diagnosis present

## 2015-05-03 LAB — I-STAT ARTERIAL BLOOD GAS, ED
Acid-Base Excess: 6 mmol/L — ABNORMAL HIGH (ref 0.0–2.0)
Bicarbonate: 36.3 mEq/L — ABNORMAL HIGH (ref 20.0–24.0)
O2 Saturation: 91 %
PCO2 ART: 73.3 mmHg — AB (ref 35.0–45.0)
PH ART: 7.303 — AB (ref 7.350–7.450)
TCO2: 39 mmol/L (ref 0–100)
pO2, Arterial: 70 mmHg — ABNORMAL LOW (ref 80.0–100.0)

## 2015-05-03 LAB — CBC WITH DIFFERENTIAL/PLATELET
Basophils Absolute: 0.1 10*3/uL (ref 0.0–0.1)
Basophils Relative: 1 %
EOS ABS: 0.1 10*3/uL (ref 0.0–0.7)
Eosinophils Relative: 1 %
HCT: 60.8 % — ABNORMAL HIGH (ref 36.0–46.0)
HEMOGLOBIN: 19.8 g/dL — AB (ref 12.0–15.0)
LYMPHS ABS: 1.9 10*3/uL (ref 0.7–4.0)
Lymphocytes Relative: 32 %
MCH: 33.2 pg (ref 26.0–34.0)
MCHC: 32.6 g/dL (ref 30.0–36.0)
MCV: 101.8 fL — ABNORMAL HIGH (ref 78.0–100.0)
MONO ABS: 0.6 10*3/uL (ref 0.1–1.0)
Monocytes Relative: 10 %
NEUTROS PCT: 56 %
Neutro Abs: 3.2 10*3/uL (ref 1.7–7.7)
PLATELETS: 130 10*3/uL — AB (ref 150–400)
RBC: 5.97 MIL/uL — AB (ref 3.87–5.11)
RDW: 16.2 % — ABNORMAL HIGH (ref 11.5–15.5)
WBC: 5.9 10*3/uL (ref 4.0–10.5)

## 2015-05-03 LAB — COMPREHENSIVE METABOLIC PANEL
ALBUMIN: 3.3 g/dL — AB (ref 3.5–5.0)
ALK PHOS: 83 U/L (ref 38–126)
ALT: 17 U/L (ref 14–54)
ANION GAP: 13 (ref 5–15)
AST: 41 U/L (ref 15–41)
BUN: 18 mg/dL (ref 6–20)
CALCIUM: 8.7 mg/dL — AB (ref 8.9–10.3)
CHLORIDE: 100 mmol/L — AB (ref 101–111)
CO2: 31 mmol/L (ref 22–32)
CREATININE: 0.98 mg/dL (ref 0.44–1.00)
GFR calc non Af Amer: >60 mL/min (ref >60)
GLUCOSE: 99 mg/dL (ref 65–99)
Potassium: 5.1 mmol/L (ref 3.5–5.1)
SODIUM: 144 mmol/L (ref 135–145)
Total Bilirubin: 1 mg/dL (ref 0.3–1.2)
Total Protein: 6.5 g/dL (ref 6.5–8.1)

## 2015-05-03 LAB — I-STAT BETA HCG BLOOD, ED (MC, WL, AP ONLY): I-stat hCG, quantitative: <5 m[IU]/mL (ref <5)

## 2015-05-03 LAB — BRAIN NATRIURETIC PEPTIDE: B NATRIURETIC PEPTIDE 5: 120.8 pg/mL — AB (ref 0.0–100.0)

## 2015-05-03 LAB — I-STAT TROPONIN, ED: Troponin i, poc: 0.05 ng/mL (ref 0.00–0.08)

## 2015-05-03 MED ORDER — ALBUTEROL (5 MG/ML) CONTINUOUS INHALATION SOLN
15.0000 mg/h | INHALATION_SOLUTION | Freq: Once | RESPIRATORY_TRACT | Status: AC
  Administered 2015-05-03: 15 mg/h via RESPIRATORY_TRACT
  Filled 2015-05-03: qty 20

## 2015-05-03 MED ORDER — ALBUTEROL SULFATE (2.5 MG/3ML) 0.083% IN NEBU
INHALATION_SOLUTION | RESPIRATORY_TRACT | Status: AC
  Filled 2015-05-03: qty 6

## 2015-05-03 MED ORDER — IPRATROPIUM-ALBUTEROL 0.5-2.5 (3) MG/3ML IN SOLN
3.0000 mL | Freq: Once | RESPIRATORY_TRACT | Status: AC
  Administered 2015-05-03: 3 mL via RESPIRATORY_TRACT

## 2015-05-03 MED ORDER — DEXTROSE 5 % IV SOLN
500.0000 mg | Freq: Once | INTRAVENOUS | Status: AC
  Administered 2015-05-04: 500 mg via INTRAVENOUS
  Filled 2015-05-03: qty 500

## 2015-05-03 MED ORDER — IOPAMIDOL (ISOVUE-370) INJECTION 76%
INTRAVENOUS | Status: AC
  Administered 2015-05-03: 100 mL
  Filled 2015-05-03: qty 100

## 2015-05-03 MED ORDER — ALBUTEROL SULFATE (2.5 MG/3ML) 0.083% IN NEBU
5.0000 mg | INHALATION_SOLUTION | Freq: Once | RESPIRATORY_TRACT | Status: AC
  Administered 2015-05-03: 5 mg via RESPIRATORY_TRACT

## 2015-05-03 MED ORDER — DEXTROSE 5 % IV SOLN
2.0000 g | Freq: Once | INTRAVENOUS | Status: AC
  Administered 2015-05-04: 2 g via INTRAVENOUS
  Filled 2015-05-03: qty 2

## 2015-05-03 MED ORDER — IPRATROPIUM-ALBUTEROL 0.5-2.5 (3) MG/3ML IN SOLN
3.0000 mL | Freq: Four times a day (QID) | RESPIRATORY_TRACT | Status: DC
  Administered 2015-05-03: 3 mL via RESPIRATORY_TRACT

## 2015-05-03 MED ORDER — METHYLPREDNISOLONE SODIUM SUCC 125 MG IJ SOLR
125.0000 mg | Freq: Once | INTRAMUSCULAR | Status: AC
  Administered 2015-05-03: 125 mg via INTRAVENOUS
  Filled 2015-05-03: qty 2

## 2015-05-03 NOTE — Progress Notes (Signed)
HPI:  LEVERN NAGASAWA is a pleasant 45 yo with and extensive PMH significant for morbid obesity, obesity hypoventilation syndrome, possible asthma, HTN, HLD, seasonal allergies, OSA, cardiomyopathy, anxiety, prediabetes, bipolar disorder, cor pulmonale, hx CHF followed by multiple specialists including pulmonology, cardiology and psychiatry. She is here for an acute visit for shortness of breath: -started: Yesterday -symptoms:nasal congestion, sneezing, low-grade temperature of 99, sore mild cough, wheezing and shortness of breath -denies:NVD, tooth pain, palpitations, sig or rapid weight gain, thick sputum production, chest pain -has tried: albuterol once this morning -sick contacts/travel/risks: no reported flu, strep or tick exposure -Hx of: allergies an dasthma - sees Dr. Elsworth Soho; cardiomyopathy, HTN - sees Dr. Percival Spanish ROS: See pertinent positives and negatives per HPI.  Past Medical History  Diagnosis Date  . Morbid obesity ([place])   . Obesity hypoventilation syndrome ([place])   . Bipolar disorder ([place])   . Hypoxemia     history of - no home O2  . Cor pulmonale ([place])   . History of pleural effusion   . Spinal stenosis in cervical region   . Acute systolic congestive heart failure ([place])   . Seasonal allergies     current runny nose  . Arthritis     osteoarthritis bilal. knees  . Urinary incontinence   . Abrasion of breast 06/21/2011    left  . Sleep apnea sleep study 07/02/2010    uses CPAP nightly  . Asthma     no per PFT 7/13; reports does not have asthma  . Seizures ([place])     febrile seizure x 1 as a child. Several times  . IBS (irritable bowel syndrome)   . Carpal tunnel syndrome of left wrist 05/2011    Being evaluated for MS  . Prediabetes   . Agoraphobia   . Hyperlipidemia   . Hypertension   . Depression   . Anxiety     panic attacks  . Allergy     Past Surgical History  Procedure Laterality Date  . Wisdom tooth extraction    . Mass excision  08/07/2010   right index  . Carpal tunnel release  06/27/2011    Procedure: CARPAL TUNNEL RELEASE;  Surgeon: Wynonia Sours, MD;  Location: [facility];  Service: Orthopedics;  Laterality: Left;  . Posterior cervical fusion/foraminotomy  09/11/2011    Procedure: POSTERIOR CERVICAL FUSION/FORAMINOTOMY LEVEL 1;  Surgeon: Erline Levine, MD;  Location: [facility];  Service: Neurosurgery;  Laterality: N/A;  Cervical Three-Four Posteior Cervical Fusion and Decompression.  . Carpal tunnel release  12/19/2011    Procedure: CARPAL TUNNEL RELEASE;  Surgeon: Wynonia Sours, MD;  Location: [facility];  Service: Orthopedics;  Laterality: Right;  . Trigger finger release  12/19/2011    Procedure: RELEASE TRIGGER FINGER/A-1 PULLEY;  Surgeon: Wynonia Sours, MD;  Location: [facility];  Service: Orthopedics;  Laterality: Right;  . Trigger finger release Left 12/31/2012    Procedure: RELEASE A-1 PULLEY LEFT THUMB;  Surgeon: Wynonia Sours, MD;  Location: [facility];  Service: Orthopedics;  Laterality: Left;    Family History  Problem Relation Age of Onset  . Asthma Mother   . Allergic rhinitis Mother   . Multiple sclerosis Mother   . Asthma Maternal Grandmother     Social History   Social History  . Marital Status: Married    Spouse Name: N/A  . Number of Children: N/A  . Years of Education: N/A  Occupational History  . currently unemployed    Social History Main Topics  . Smoking status: Never Smoker   . Smokeless tobacco: Never Used  . Alcohol Use: Yes     Comment: rare  . Drug Use: No  . Sexual Activity: Not Asked   Other Topics Concern  . None   Social History Narrative   Lives in Uncertain           Current outpatient prescriptions:  .  acetaminophen (TYLENOL) 500 MG tablet, Take 2,000 mg by mouth 2 (two) times daily. , Disp: , Rfl:  .  albuterol (PROVENTIL HFA;VENTOLIN HFA) 108 (90 Base) MCG/ACT inhaler, Inhale 2 puffs into the lungs every  6 (six) hours as needed for wheezing or shortness of breath., Disp: 1 Inhaler, Rfl: 3 .  ALPRAZolam (XANAX) 1 MG tablet, Take 1 mg by mouth 3 (three) times daily. , Disp: , Rfl:  .  Ascorbic Acid (VITAMIN C) 500 MG tablet, Take 1,000 mg by mouth daily. , Disp: , Rfl:  .  aspirin 81 MG tablet, Take 81 mg by mouth daily. , Disp: , Rfl:  .  carbamazepine (TEGRETOL) 200 MG tablet, Take 200 mg by mouth at bedtime. Take 1 tablet in the morning and 2 tablets at night, Disp: , Rfl:  .  Cholecalciferol (VITAMIN D3) 5000 UNITS TABS, Take 3 tablets by mouth daily. , Disp: , Rfl:  .  Chromium Picolinate 500 MCG CAPS, Take 1 capsule by mouth daily., Disp: , Rfl:  .  clonazePAM (KLONOPIN) 1 MG tablet, Take 1 mg by mouth 4 (four) times daily. , Disp: , Rfl:  .  doxycycline (VIBRA-TABS) 100 MG tablet, Take 1 tablet (100 mg total) by mouth 2 (two) times daily., Disp: 20 tablet, Rfl: 0 .  furosemide (LASIX) 40 MG tablet, Take 2 tablets (80 mg total) by mouth 2 (two) times daily., Disp: 360 tablet, Rfl: 3 .  KLOR-CON M20 20 MEQ tablet, TAKE 1 TABLET DAILY, Disp: 90 tablet, Rfl: 3 .  lamoTRIgine (LAMICTAL) 200 MG tablet, Take 200 mg by mouth 2 (two) times daily. , Disp: , Rfl:  .  lidocaine (LIDODERM) 5 %, Place 1 patch onto the skin daily as needed (for knee pain). Remove & Discard patch within 12 hours or as directed by MD, Disp: 30 patch, Rfl: 0 .  losartan (COZAAR) 25 MG tablet, Take 1 tablet (25 mg total) by mouth 2 (two) times daily., Disp: 180 tablet, Rfl: 3 .  Lurasidone HCl (LATUDA) 120 MG TABS, Take 1 tablet by mouth daily., Disp: , Rfl:  .  LYRICA 300 MG capsule, Take 1 tablet by mouth Twice daily., Disp: , Rfl:  .  MAGNESIUM PO, Take 1 tablet by mouth daily., Disp: , Rfl:  .  Meloxicam (MOBIC PO), Take 7.5 mg by mouth every 12 (twelve) hours. , Disp: , Rfl:  .  metFORMIN (GLUCOPHAGE) 500 MG tablet, TAKE 1 TABLET BY MOUTH TWICE DAILY WITH MEALS, Disp: 180 tablet, Rfl: 1 .  methylphenidate (RITALIN) 10 MG  tablet, 5-20 mg. Takes 20 mg morning and noon, and 5-10 mg at 4pm, Disp: , Rfl:  .  mometasone (NASONEX) 50 MCG/ACT nasal spray, USE 2 SPRAYS NASALLY DAILY (Patient taking differently: daily as needed), Disp: 17 g, Rfl: 1 .  Multiple Vitamin (MULTIVITAMIN) tablet, Take 1 tablet by mouth daily.  , Disp: , Rfl:  .  norethindrone-ethinyl estradiol-iron (MICROGESTIN FE 1.5/30) 1.5-30 MG-MCG tablet, Take 1 tablet by mouth at bedtime. Takes continuously, Disp: ,  Rfl:  .  VOLTAREN 1 % GEL, Apply three times daily as needed, Disp: , Rfl:  .  Zinc 50 MG CAPS, Take 50 mg by mouth daily., Disp: , Rfl:  No current facility-administered medications for this visit.  EXAM:  Filed Vitals:   05/03/15 1353  BP: 132/80  Pulse: 90  Temp: 99.9 F (37.7 C)    There is no weight on file to calculate BMI. Patient refused. Monitors at home and reports gradually small weight gain of a few pounds.  GENERAL: vitals reviewed and listed above, alert, oriented, appears well hydrated, Always diaphoretic for visit 2ndary to anxiety  HEENT: atraumatic, conjunttiva clear, no obvious abnormalities on inspection of external nose and ears, normal appearance of ear canals and TMs, clear nasal congestion, mild post oropharyngeal erythema with PND, no tonsillar edema or exudate, no sinus TTP  NECK: no obvious masses on inspection  LUNGS: Diffuse expiratory wheeze, decreased breath sounds at bases, increased RR, hypoxia with O2 in low upper 70s-80s initially on RA - patient refused EMS transport to the hospital for evaluation; oxygen started via nonrebreather and O2 came up to the mid 80s-low 90s, breathing treatment administered per patient wishes  CV: HRRR,  no peripheral edema  MS: moves all extremities without noticeable abnormality  PSYCH: pleasant and cooperative, no obvious depression or anxiety  ASSESSMENT AND PLAN:  Discussed the following assessment and plan: > 40 minutes spent in caring for this pt with > 50%  spent face to face Acute on chronic respiratory failure with hypoxia (HCC)  Shortness of breath - Plan: ipratropium-albuterol (DUONEB) 0.5-2.5 (3) MG/3ML nebulizer solution 3 mL, DISCONTINUED: ipratropium-albuterol (DUONEB) 0.5-2.5 (3) MG/3ML nebulizer solution 3 mL  Anxiety  Bipolar disorder, unspecified (HCC)  Asthma, mild intermittent, uncomplicated  Cardiomyopathy ([place])  Chronic systolic heart failure   Obesity hypoventilation syndrome (HCC)  -patient with complex psychiatric and PMH with reported traumatic EMS and hospital experience in the past and acute onset of dyspnea in acute hypoxic respiratory failure -initially refused advised EMS transport to hospital for further eval given hypoxia in with O2 sats in upper 70s -low 80s -O2 sats on O2 after breathing treatment around 88 -we discussed possible serious and likely etiologies, workup and treatment, treatment risks and return precautions -advised EMS transport to hospital for further evaluation and management - she refused EMS transport and left clinic AMA after signing form that she understands risks -she was fully able to report back understanding of current situation, risks including death, and in front of another provider and staff reported such and signed form stating leaving against medical advice  -she agreed to go to hospital with family member - she adamantly insisted she go home first to collect medications and belongings and refused any other course of action. We offered to pay for cab for someone else to collect her belongings, offered to send with O2 if goes straight to hospital, etc - she refused all of these offers. -Advised assistant to notify ER staff of situation and to call pt to follow up to ensure goes to hospital and check on her -of course, we advised Shefali  to return or notify a doctor immediately if symptoms worsen or persist or new concerns arise.  -of course, we advised to return or notify a doctor  immediately if symptoms worsen or persist or new concerns arise.    There are no Patient Instructions on file for this visit.   Colin Benton R.

## 2015-05-03 NOTE — ED Notes (Signed)
Pt became agitated because BP was being taken in her right forearm. Pt stated that she "thought that her BP was incorrect". Pt began to breath quickly and was told by her relative to "calm down". Informed Janett Billow - Nurse 1st RN.

## 2015-05-03 NOTE — ED Provider Notes (Signed)
CSN: [id number]     Arrival date & time 05/03/15  1551 History   First MD Initiated Contact with Patient 05/03/15 1846     Chief Complaint  Patient presents with  . Shortness of Breath     (Consider location/radiation/quality/duration/timing/severity/associated sxs/prior Treatment) HPI  45 year old female presents with shortness of breath. Patient states she started having sneezing and eyes watering about 3 or 4 days ago. Went to her PCP this morning because since yesterday she's had oxygen saturation saturations in the 80s on her home pulse ox. Has a prior history of obesity hypoventilation and previously required oxygen but has been weaned off. Patient states there is no chest pain, tightness. No coughing but it feels like her chest is congested. She's been taking Mucinex. She was given 2 breathing treatments prior to me seeing her and states her symptoms are somewhat better and she is now beginning to cough. On arrival oxygen saturations were 83% on room air and she is currently on 4 L. No leg swelling. No fevers.  Past Medical History  Diagnosis Date  . Morbid obesity ([place])   . Obesity hypoventilation syndrome ([place])   . Bipolar disorder ([place])   . Hypoxemia     history of - no home O2  . Cor pulmonale ([place])   . History of pleural effusion   . Spinal stenosis in cervical region   . Acute systolic congestive heart failure ([place])   . Seasonal allergies     current runny nose  . Arthritis     osteoarthritis bilal. knees  . Urinary incontinence   . Abrasion of breast 06/21/2011    left  . Sleep apnea sleep study 07/02/2010    uses CPAP nightly  . Asthma     no per PFT 7/13; reports does not have asthma  . Seizures ([place])     febrile seizure x 1 as a child. Several times  . IBS (irritable bowel syndrome)   . Carpal tunnel syndrome of left wrist 05/2011    Being evaluated for MS  . Prediabetes   . Agoraphobia   . Hyperlipidemia   . Hypertension   . Depression   . Anxiety     panic  attacks  . Allergy    Past Surgical History  Procedure Laterality Date  . Wisdom tooth extraction    . Mass excision  08/07/2010    right index  . Carpal tunnel release  06/27/2011    Procedure: CARPAL TUNNEL RELEASE;  Surgeon: Wynonia Sours, MD;  Location: [facility];  Service: Orthopedics;  Laterality: Left;  . Posterior cervical fusion/foraminotomy  09/11/2011    Procedure: POSTERIOR CERVICAL FUSION/FORAMINOTOMY LEVEL 1;  Surgeon: Erline Levine, MD;  Location: [facility];  Service: Neurosurgery;  Laterality: N/A;  Cervical Three-Four Posteior Cervical Fusion and Decompression.  . Carpal tunnel release  12/19/2011    Procedure: CARPAL TUNNEL RELEASE;  Surgeon: Wynonia Sours, MD;  Location: [facility];  Service: Orthopedics;  Laterality: Right;  . Trigger finger release  12/19/2011    Procedure: RELEASE TRIGGER FINGER/A-1 PULLEY;  Surgeon: Wynonia Sours, MD;  Location: [facility];  Service: Orthopedics;  Laterality: Right;  . Trigger finger release Left 12/31/2012    Procedure: RELEASE A-1 PULLEY LEFT THUMB;  Surgeon: Wynonia Sours, MD;  Location: [facility];  Service: Orthopedics;  Laterality: Left;   Family History  Problem Relation Age of Onset  . Asthma Mother   .  Allergic rhinitis Mother   . Multiple sclerosis Mother   . Asthma Maternal Grandmother    Social History  Substance Use Topics  . Smoking status: Never Smoker   . Smokeless tobacco: Never Used  . Alcohol Use: Yes     Comment: rare   OB History    No data available     Review of Systems  Constitutional: Negative for fever.  HENT: Positive for congestion and sneezing.   Respiratory: Positive for shortness of breath. Negative for cough and chest tightness.   Cardiovascular: Negative for chest pain and leg swelling.  Gastrointestinal: Negative for abdominal pain.  All other systems reviewed and are negative.     Allergies  Savella  Home Medications    Prior to Admission medications   Medication Sig Start Date End Date Taking? Authorizing Provider  acetaminophen (TYLENOL) 500 MG tablet Take 2,000 mg by mouth 2 (two) times daily.     Historical Provider, MD  albuterol (PROVENTIL HFA;VENTOLIN HFA) 108 (90 Base) MCG/ACT inhaler Inhale 2 puffs into the lungs every 6 (six) hours as needed for wheezing or shortness of breath. 03/24/15   Lucretia Kern, DO  ALPRAZolam Duanne Moron) 1 MG tablet Take 1 mg by mouth 3 (three) times daily.     Historical Provider, MD  Ascorbic Acid (VITAMIN C) 500 MG tablet Take 1,000 mg by mouth daily.     Historical Provider, MD  aspirin 81 MG tablet Take 81 mg by mouth daily.     Historical Provider, MD  carbamazepine (TEGRETOL) 200 MG tablet Take 200 mg by mouth at bedtime. Take 1 tablet in the morning and 2 tablets at night    Historical Provider, MD  Cholecalciferol (VITAMIN D3) 5000 UNITS TABS Take 3 tablets by mouth daily.     Historical Provider, MD  Chromium Picolinate 500 MCG CAPS Take 1 capsule by mouth daily.    Historical Provider, MD  clonazePAM (KLONOPIN) 1 MG tablet Take 1 mg by mouth 4 (four) times daily.     Historical Provider, MD  doxycycline (VIBRA-TABS) 100 MG tablet Take 1 tablet (100 mg total) by mouth 2 (two) times daily. 03/24/15   Lucretia Kern, DO  furosemide (LASIX) 40 MG tablet Take 2 tablets (80 mg total) by mouth 2 (two) times daily. 10/22/14   Minus Breeding, MD  KLOR-CON M20 20 MEQ tablet TAKE 1 TABLET DAILY 05/24/14   Minus Breeding, MD  lamoTRIgine (LAMICTAL) 200 MG tablet Take 200 mg by mouth 2 (two) times daily.     Historical Provider, MD  lidocaine (LIDODERM) 5 % Place 1 patch onto the skin daily as needed (for knee pain). Remove & Discard patch within 12 hours or as directed by MD 10/14/14   Lucretia Kern, DO  losartan (COZAAR) 25 MG tablet Take 1 tablet (25 mg total) by mouth 2 (two) times daily. 06/30/14   Minus Breeding, MD  Lurasidone HCl (LATUDA) 120 MG TABS Take 1 tablet by mouth daily.     Historical Provider, MD  LYRICA 300 MG capsule Take 1 tablet by mouth Twice daily. 03/26/11   Historical Provider, MD  MAGNESIUM PO Take 1 tablet by mouth daily.    Historical Provider, MD  Meloxicam (MOBIC PO) Take 7.5 mg by mouth every 12 (twelve) hours.     Historical Provider, MD  metFORMIN (GLUCOPHAGE) 500 MG tablet TAKE 1 TABLET BY MOUTH TWICE DAILY WITH MEALS 11/01/14   Lucretia Kern, DO  methylphenidate (RITALIN) 10 MG tablet 5-20  mg. Takes 20 mg morning and noon, and 5-10 mg at 4pm    Historical Provider, MD  mometasone (NASONEX) 50 MCG/ACT nasal spray USE 2 SPRAYS NASALLY DAILY Patient taking differently: daily as needed 09/03/14   Lucretia Kern, DO  Multiple Vitamin (MULTIVITAMIN) tablet Take 1 tablet by mouth daily.      Historical Provider, MD  norethindrone-ethinyl estradiol-iron (MICROGESTIN FE 1.5/30) 1.5-30 MG-MCG tablet Take 1 tablet by mouth at bedtime. Takes continuously    Historical Provider, MD  VOLTAREN 1 % GEL Apply three times daily as needed 11/27/11   Historical Provider, MD  Zinc 50 MG CAPS Take 50 mg by mouth daily.    Historical Provider, MD   BP 164/96 mmHg  Pulse 86  Temp(Src) 98.8 F (37.1 C) (Oral)  Resp 22  SpO2 94% Physical Exam  Constitutional: She is oriented to person, place, and time. She appears well-developed and well-nourished. No distress.  Morbidly obese  HENT:  Head: Normocephalic and atraumatic.  Right Ear: External ear normal.  Left Ear: External ear normal.  Nose: Nose normal.  Eyes: Right eye exhibits no discharge. Left eye exhibits no discharge.  Cardiovascular: Normal rate, regular rhythm and normal heart sounds.   Pulmonary/Chest: Effort normal. She has wheezes (diffuse, expiratory).  Speaks in full sentences, is not in respiratory distress  Abdominal: Soft. There is no tenderness.  Neurological: She is alert and oriented to person, place, and time.  Skin: Skin is warm and dry. She is not diaphoretic.  Nursing note and vitals  reviewed.   ED Course  Procedures (including critical care time) Labs Review Labs Reviewed  BRAIN NATRIURETIC PEPTIDE - Abnormal; Notable for the following:    B Natriuretic Peptide 120.8 (*)    All other components within normal limits  CBC WITH DIFFERENTIAL/PLATELET - Abnormal; Notable for the following:    RBC 5.97 (*)    Hemoglobin 19.8 (*)    HCT 60.8 (*)    MCV 101.8 (*)    RDW 16.2 (*)    Platelets 130 (*)    All other components within normal limits  COMPREHENSIVE METABOLIC PANEL - Abnormal; Notable for the following:    Chloride 100 (*)    Calcium 8.7 (*)    Albumin 3.3 (*)    All other components within normal limits  I-STAT TROPOININ, ED    Imaging Review Dg Chest 2 View  05/03/2015  CLINICAL DATA:  Shortness of breath for 2 days, history of asthma EXAM: CHEST  2 VIEW COMPARISON:  06/01/2010 FINDINGS: Cardiomegaly is noted. Mild infrahilar bronchitic changes. No infiltrate or pulmonary edema. Limited study by patient's large body habitus. Mild degenerative changes thoracic spine. IMPRESSION: Cardiomegaly. Mild infrahilar bronchitic changes. Limited study by patient's large body habitus. No infiltrate or pulmonary edema. Electronically Signed   By: Lahoma Crocker M.D.   On: 05/03/2015 16:39   I have personally reviewed and evaluated these images and lab results as part of my medical decision-making.   EKG Interpretation   Date/Time:  Tuesday May 03 2015 16:00:31 EDT Ventricular Rate:  97 PR Interval:  180 QRS Duration: 80 QT Interval:  358 QTC Calculation: 454 R Axis:   -116 Text Interpretation:  Normal sinus rhythm Right superior axis deviation  Pulmonary disease pattern Right ventricular hypertrophy Septal infarct ,  age undetermined Abnormal ECG No old tracing to compare Confirmed by  Lekisha Mcghee  MD, Michaelina Blandino ([id number]) on 05/03/2015 6:47:16 PM      MDM   Final diagnoses:  Acute on chronic respiratory failure with hypoxia and hypercapnia ([place])  Community acquired  pneumonia    Patient does not appear in distress but has a new O2 requirement. She tells me her O2 is normally in 90s when she checks it at home. Diffusely wheezing but her hypoxia is not c/w her relative comfort and lack of respiratory distress. Given continuous albuterol but still hypoxic. Placed on bipap for acute on chronic respiratory failure with pH of 7.3. She normally wears CPAP at night. CT obtained to r/o PE given hypoxia and shows PNA. Will treat with antibiotics, oxygen and supportive care. Admit to hospitalist, Dr. Alcario Drought. Requests Stepdown unit.    Sherwood Gambler, MD 05/04/15 [phone]

## 2015-05-03 NOTE — Progress Notes (Signed)
Arterial blood gas drawn while patient wearing oxygen set at 4lpm. Results given to Dr. Regenia Skeeter, placed patient on bipap and 15mg /hr continuous nebulizer started.

## 2015-05-03 NOTE — ED Notes (Addendum)
Today, Pt went to PCP for sneezing and watery eyes and was sent here for hypoxia, O2 sat there was 72% RA . They gave breathing txt and oxygen and sats increased to 92% but decreased again once breathing txt was done. Pt refused to be transferred here via EMS so her husband drove her here. O2 sats in triage 86% RA. Denies chest pain. Pt appears diaphoretic.

## 2015-05-03 NOTE — Telephone Encounter (Signed)
Per Dr Maudie Mercury I called the pts cell number and to see if she went to the hospital and left a message I was calling to check on her and to call the office back if needed.

## 2015-05-03 NOTE — ED Notes (Addendum)
Patient placed on oxygen [place] (2L)

## 2015-05-03 NOTE — Progress Notes (Signed)
Removed patient from Bipap and placed on 6lpm nasal cannula.

## 2015-05-03 NOTE — Telephone Encounter (Signed)
Error

## 2015-05-03 NOTE — Progress Notes (Signed)
Pre visit review using our clinic review tool, if applicable. No additional management support is needed unless otherwise documented below in the visit note. 

## 2015-05-04 DIAGNOSIS — I5042 Chronic combined systolic (congestive) and diastolic (congestive) heart failure: Secondary | ICD-10-CM | POA: Diagnosis present

## 2015-05-04 DIAGNOSIS — J9621 Acute and chronic respiratory failure with hypoxia: Secondary | ICD-10-CM | POA: Diagnosis not present

## 2015-05-04 DIAGNOSIS — R7881 Bacteremia: Secondary | ICD-10-CM | POA: Diagnosis present

## 2015-05-04 DIAGNOSIS — I272 Other secondary pulmonary hypertension: Secondary | ICD-10-CM | POA: Diagnosis present

## 2015-05-04 DIAGNOSIS — E119 Type 2 diabetes mellitus without complications: Secondary | ICD-10-CM | POA: Diagnosis present

## 2015-05-04 DIAGNOSIS — J9622 Acute and chronic respiratory failure with hypercapnia: Secondary | ICD-10-CM | POA: Diagnosis present

## 2015-05-04 DIAGNOSIS — Z7984 Long term (current) use of oral hypoglycemic drugs: Secondary | ICD-10-CM | POA: Diagnosis not present

## 2015-05-04 DIAGNOSIS — J45909 Unspecified asthma, uncomplicated: Secondary | ICD-10-CM | POA: Diagnosis present

## 2015-05-04 DIAGNOSIS — R0600 Dyspnea, unspecified: Secondary | ICD-10-CM | POA: Diagnosis not present

## 2015-05-04 DIAGNOSIS — I2781 Cor pulmonale (chronic): Secondary | ICD-10-CM | POA: Diagnosis present

## 2015-05-04 DIAGNOSIS — Z79899 Other long term (current) drug therapy: Secondary | ICD-10-CM | POA: Diagnosis not present

## 2015-05-04 DIAGNOSIS — I5022 Chronic systolic (congestive) heart failure: Secondary | ICD-10-CM | POA: Diagnosis not present

## 2015-05-04 DIAGNOSIS — F319 Bipolar disorder, unspecified: Secondary | ICD-10-CM | POA: Diagnosis not present

## 2015-05-04 DIAGNOSIS — J9620 Acute and chronic respiratory failure, unspecified whether with hypoxia or hypercapnia: Secondary | ICD-10-CM | POA: Diagnosis present

## 2015-05-04 DIAGNOSIS — I110 Hypertensive heart disease with heart failure: Secondary | ICD-10-CM | POA: Diagnosis present

## 2015-05-04 DIAGNOSIS — G4733 Obstructive sleep apnea (adult) (pediatric): Secondary | ICD-10-CM | POA: Diagnosis present

## 2015-05-04 DIAGNOSIS — E785 Hyperlipidemia, unspecified: Secondary | ICD-10-CM | POA: Diagnosis present

## 2015-05-04 DIAGNOSIS — Z7982 Long term (current) use of aspirin: Secondary | ICD-10-CM | POA: Diagnosis not present

## 2015-05-04 DIAGNOSIS — R0602 Shortness of breath: Secondary | ICD-10-CM | POA: Diagnosis not present

## 2015-05-04 DIAGNOSIS — F419 Anxiety disorder, unspecified: Secondary | ICD-10-CM | POA: Diagnosis present

## 2015-05-04 DIAGNOSIS — Z981 Arthrodesis status: Secondary | ICD-10-CM | POA: Diagnosis not present

## 2015-05-04 DIAGNOSIS — Z6841 Body Mass Index (BMI) 40.0 and over, adult: Secondary | ICD-10-CM | POA: Diagnosis not present

## 2015-05-04 DIAGNOSIS — J189 Pneumonia, unspecified organism: Secondary | ICD-10-CM | POA: Diagnosis not present

## 2015-05-04 DIAGNOSIS — D45 Polycythemia vera: Secondary | ICD-10-CM | POA: Diagnosis present

## 2015-05-04 DIAGNOSIS — F411 Generalized anxiety disorder: Secondary | ICD-10-CM | POA: Diagnosis not present

## 2015-05-04 DIAGNOSIS — E662 Morbid (severe) obesity with alveolar hypoventilation: Secondary | ICD-10-CM | POA: Diagnosis not present

## 2015-05-04 DIAGNOSIS — E1169 Type 2 diabetes mellitus with other specified complication: Secondary | ICD-10-CM

## 2015-05-04 LAB — GLUCOSE, CAPILLARY
Glucose-Capillary: 168 mg/dL — ABNORMAL HIGH (ref 65–99)
Glucose-Capillary: 85 mg/dL (ref 65–99)

## 2015-05-04 LAB — MRSA PCR SCREENING: MRSA by PCR: NEGATIVE

## 2015-05-04 LAB — STREP PNEUMONIAE URINARY ANTIGEN: Strep Pneumo Urinary Antigen: NEGATIVE

## 2015-05-04 LAB — HIV ANTIBODY (ROUTINE TESTING W REFLEX): HIV Screen 4th Generation wRfx: NONREACTIVE

## 2015-05-04 MED ORDER — METFORMIN HCL 500 MG PO TABS
500.0000 mg | ORAL_TABLET | Freq: Two times a day (BID) | ORAL | Status: DC
  Administered 2015-05-04 – 2015-05-05 (×4): 500 mg via ORAL
  Filled 2015-05-04 (×5): qty 1

## 2015-05-04 MED ORDER — CLONAZEPAM 1 MG PO TABS
1.0000 mg | ORAL_TABLET | Freq: Four times a day (QID) | ORAL | Status: DC
  Filled 2015-05-04: qty 1
  Filled 2015-05-04: qty 2

## 2015-05-04 MED ORDER — ZINC SULFATE 220 (50 ZN) MG PO CAPS
220.0000 mg | ORAL_CAPSULE | Freq: Every day | ORAL | Status: DC
  Administered 2015-05-04 – 2015-05-05 (×2): 220 mg via ORAL
  Filled 2015-05-04 (×4): qty 1

## 2015-05-04 MED ORDER — METHYLPHENIDATE HCL 5 MG PO TABS: 5.0000 mg | ORAL_TABLET | Freq: Every day | ORAL | Status: DC

## 2015-05-04 MED ORDER — MELOXICAM 7.5 MG PO TABS
7.5000 mg | ORAL_TABLET | Freq: Two times a day (BID) | ORAL | Status: DC
  Administered 2015-05-04 – 2015-05-06 (×5): 7.5 mg via ORAL
  Filled 2015-05-04 (×7): qty 1

## 2015-05-04 MED ORDER — ASPIRIN EC 81 MG PO TBEC
81.0000 mg | DELAYED_RELEASE_TABLET | Freq: Every day | ORAL | Status: DC
  Administered 2015-05-04 – 2015-05-05 (×2): 81 mg via ORAL
  Filled 2015-05-04 (×3): qty 1

## 2015-05-04 MED ORDER — DEXTROSE 5 % IV SOLN
2.0000 g | Freq: Every day | INTRAVENOUS | Status: DC
  Administered 2015-05-04: 2 g via INTRAVENOUS
  Filled 2015-05-04 (×2): qty 2

## 2015-05-04 MED ORDER — ALBUTEROL SULFATE (2.5 MG/3ML) 0.083% IN NEBU: 2.5000 mg | INHALATION_SOLUTION | RESPIRATORY_TRACT | Status: DC | PRN

## 2015-05-04 MED ORDER — VANCOMYCIN HCL 10 G IV SOLR
2500.0000 mg | Freq: Once | INTRAVENOUS | Status: AC
  Administered 2015-05-04: 2500 mg via INTRAVENOUS
  Filled 2015-05-04: qty 2500

## 2015-05-04 MED ORDER — ALBUTEROL SULFATE HFA 108 (90 BASE) MCG/ACT IN AERS: 2.0000 | INHALATION_SPRAY | Freq: Four times a day (QID) | RESPIRATORY_TRACT | Status: DC | PRN

## 2015-05-04 MED ORDER — CARBAMAZEPINE 200 MG PO TABS
200.0000 mg | ORAL_TABLET | Freq: Every day | ORAL | Status: DC
  Administered 2015-05-04 – 2015-05-05 (×2): 200 mg via ORAL
  Filled 2015-05-04 (×3): qty 1

## 2015-05-04 MED ORDER — FUROSEMIDE 80 MG PO TABS
80.0000 mg | ORAL_TABLET | Freq: Two times a day (BID) | ORAL | Status: DC
  Administered 2015-05-04 – 2015-05-06 (×5): 80 mg via ORAL
  Filled 2015-05-04 (×7): qty 1

## 2015-05-04 MED ORDER — METHYLPHENIDATE HCL 5 MG PO TABS
20.0000 mg | ORAL_TABLET | Freq: Two times a day (BID) | ORAL | Status: DC
  Administered 2015-05-04 – 2015-05-06 (×5): 20 mg via ORAL
  Filled 2015-05-04 (×5): qty 4

## 2015-05-04 MED ORDER — AZITHROMYCIN 500 MG PO TABS
500.0000 mg | ORAL_TABLET | ORAL | Status: DC
  Administered 2015-05-04 – 2015-05-05 (×2): 500 mg via ORAL
  Filled 2015-05-04 (×3): qty 1

## 2015-05-04 MED ORDER — ALPRAZOLAM 0.5 MG PO TABS
1.0000 mg | ORAL_TABLET | Freq: Three times a day (TID) | ORAL | Status: DC
  Administered 2015-05-04 – 2015-05-06 (×7): 1 mg via ORAL
  Filled 2015-05-04 (×4): qty 2
  Filled 2015-05-04: qty 4
  Filled 2015-05-04 (×5): qty 2

## 2015-05-04 MED ORDER — LAMOTRIGINE 200 MG PO TABS
200.0000 mg | ORAL_TABLET | Freq: Two times a day (BID) | ORAL | Status: DC
  Administered 2015-05-04 – 2015-05-06 (×4): 200 mg via ORAL
  Filled 2015-05-04 (×8): qty 1

## 2015-05-04 MED ORDER — DEXTROSE 5 % IV SOLN: 1.0000 g | INTRAVENOUS | Status: DC

## 2015-05-04 MED ORDER — CETYLPYRIDINIUM CHLORIDE 0.05 % MT LIQD
7.0000 mL | Freq: Two times a day (BID) | OROMUCOSAL | Status: DC
  Administered 2015-05-04 – 2015-05-06 (×4): 7 mL via OROMUCOSAL

## 2015-05-04 MED ORDER — VITAMIN C 500 MG PO TABS
1000.0000 mg | ORAL_TABLET | Freq: Every day | ORAL | Status: DC
  Administered 2015-05-04 – 2015-05-05 (×2): 1000 mg via ORAL
  Filled 2015-05-04 (×4): qty 2

## 2015-05-04 MED ORDER — ACETAMINOPHEN 500 MG PO TABS
1000.0000 mg | ORAL_TABLET | Freq: Two times a day (BID) | ORAL | Status: DC
  Administered 2015-05-04 – 2015-05-06 (×5): 1000 mg via ORAL
  Filled 2015-05-04 (×6): qty 2

## 2015-05-04 MED ORDER — CLONAZEPAM 1 MG PO TABS
1.0000 mg | ORAL_TABLET | Freq: Three times a day (TID) | ORAL | Status: DC
  Administered 2015-05-04 – 2015-05-06 (×5): 1 mg via ORAL
  Filled 2015-05-04 (×6): qty 1

## 2015-05-04 MED ORDER — LURASIDONE HCL 40 MG PO TABS
120.0000 mg | ORAL_TABLET | Freq: Every day | ORAL | Status: DC
  Administered 2015-05-04 – 2015-05-05 (×2): 120 mg via ORAL
  Filled 2015-05-04 (×4): qty 3

## 2015-05-04 MED ORDER — VANCOMYCIN HCL 10 G IV SOLR
1750.0000 mg | Freq: Two times a day (BID) | INTRAVENOUS | Status: DC
  Administered 2015-05-05 – 2015-05-06 (×3): 1750 mg via INTRAVENOUS
  Filled 2015-05-04 (×5): qty 1750

## 2015-05-04 MED ORDER — HYDROCODONE-ACETAMINOPHEN 5-325 MG PO TABS
1.0000 | ORAL_TABLET | Freq: Four times a day (QID) | ORAL | Status: DC | PRN
  Administered 2015-05-04: 1 via ORAL
  Administered 2015-05-05: 2 via ORAL
  Filled 2015-05-04: qty 2
  Filled 2015-05-04: qty 1

## 2015-05-04 MED ORDER — ADULT MULTIVITAMIN W/MINERALS CH
1.0000 | ORAL_TABLET | Freq: Every day | ORAL | Status: DC
  Administered 2015-05-04 – 2015-05-06 (×3): 1 via ORAL
  Filled 2015-05-04 (×4): qty 1

## 2015-05-04 MED ORDER — METHYLPHENIDATE HCL 5 MG PO TABS: 5.0000 mg | ORAL_TABLET | Freq: Three times a day (TID) | ORAL | Status: DC

## 2015-05-04 MED ORDER — ENOXAPARIN SODIUM 80 MG/0.8ML ~~LOC~~ SOLN
80.0000 mg | Freq: Every day | SUBCUTANEOUS | Status: DC
  Administered 2015-05-04 – 2015-05-06 (×3): 80 mg via SUBCUTANEOUS
  Filled 2015-05-04 (×3): qty 0.8

## 2015-05-04 MED ORDER — ALBUTEROL SULFATE (2.5 MG/3ML) 0.083% IN NEBU: 2.5000 mg | INHALATION_SOLUTION | Freq: Four times a day (QID) | RESPIRATORY_TRACT | Status: DC | PRN

## 2015-05-04 MED ORDER — ENOXAPARIN SODIUM 40 MG/0.4ML ~~LOC~~ SOLN: 40.0000 mg | Freq: Every day | SUBCUTANEOUS | Status: DC

## 2015-05-04 MED ORDER — B COMPLEX-C PO TABS
1.0000 | ORAL_TABLET | Freq: Every day | ORAL | Status: DC
  Administered 2015-05-04 – 2015-05-06 (×3): 1 via ORAL
  Filled 2015-05-04 (×3): qty 1

## 2015-05-04 MED ORDER — LOSARTAN POTASSIUM 25 MG PO TABS
25.0000 mg | ORAL_TABLET | Freq: Two times a day (BID) | ORAL | Status: DC
  Administered 2015-05-04 – 2015-05-06 (×4): 25 mg via ORAL
  Filled 2015-05-04 (×7): qty 1

## 2015-05-04 MED ORDER — POTASSIUM CHLORIDE CRYS ER 20 MEQ PO TBCR
20.0000 meq | EXTENDED_RELEASE_TABLET | Freq: Every day | ORAL | Status: DC
  Administered 2015-05-05: 20 meq via ORAL
  Filled 2015-05-04 (×3): qty 1

## 2015-05-04 MED ORDER — CHROMIUM PICOLINATE 500 MCG PO CAPS: 1.0000 | ORAL_CAPSULE | Freq: Every day | ORAL | Status: DC

## 2015-05-04 MED ORDER — MAGNESIUM OXIDE 400 (241.3 MG) MG PO TABS
400.0000 mg | ORAL_TABLET | Freq: Every day | ORAL | Status: DC
  Administered 2015-05-04 – 2015-05-06 (×3): 400 mg via ORAL
  Filled 2015-05-04 (×4): qty 1

## 2015-05-04 MED ORDER — NORETHIN ACE-ETH ESTRAD-FE 1.5-30 MG-MCG PO TABS
1.0000 | ORAL_TABLET | Freq: Every day | ORAL | Status: DC
  Administered 2015-05-04 – 2015-05-05 (×2): 1 via ORAL

## 2015-05-04 MED ORDER — PREGABALIN 50 MG PO CAPS
300.0000 mg | ORAL_CAPSULE | Freq: Two times a day (BID) | ORAL | Status: DC
  Administered 2015-05-04 – 2015-05-06 (×4): 300 mg via ORAL
  Filled 2015-05-04 (×5): qty 6

## 2015-05-04 NOTE — ED Notes (Signed)
Pt states that she took all her home medications a few hours ago and Dr. Verner Chol stated it was ok to do so all except for her potassium

## 2015-05-04 NOTE — Progress Notes (Signed)
Pharmacy Antibiotic Not [name] is a 45 y.o. female admitted on 05/03/2015 with pneumonia (CAP) now with GPC in 2/2 BCx' s.  Pharmacy has been consulted for vancomycin dosing.  Plan: 1. Vancomycin 2500 mg LD x 1 followed by 1750 mg Q 12 hours starting 4/6 am 2. Hard to estimate overall CrCl due to body habitus so will plan to draw trough early in course of therapy; goal 15 -20  3. F/u Bcx's and narrow abx as feasible 4. BMP for 4/6 am  Height: 5\' 5"  (165.1 cm) Weight: (!) 373 lb 10.9 oz (169.5 kg) IBW/kg (Calculated) : 57  Temp (24hrs), Avg:98.6 F (37 C), Min:98.4 F (36.9 C), Max:99.1 F (37.3 C)   Recent Labs Lab 05/03/15 1604  WBC 5.9  CREATININE 0.98    Estimated Creatinine Clearance: 116.7 mL/min (by C-G formula based on Cr of 0.98).    Allergies  Allergen Reactions  . Savella [Milnacipran Hcl]     mania    Antimicrobials this admission: 4/5 CTX >>  4/5 Azithro >>  4/5 Vancomycin >>   Dose adjustments this admission: n/a  Microbiology results: 4/5 BCx: GPC in 2/2   Thank you for allowing pharmacy to be a part of this patient's care.  Vincenza Hews, PharmD, BCPS 05/04/2015, 7:29 PM Pager: [phone]

## 2015-05-04 NOTE — Progress Notes (Signed)
Blood Cultures positive for Gram Pos Cocci in clusters, both sets.  Dr. Thereasa Solo notified. [place], Ardeth Sportsman

## 2015-05-04 NOTE — Progress Notes (Signed)
PHARMACIST - PHYSICIAN ORDER COMMUNICATION  CONCERNING: P&T Medication Policy on Herbal Medications  DESCRIPTION:  This patient's order for:  Chromium Picolinate  has been noted.  This product(s) is classified as an "herbal" or natural product. Due to a lack of definitive safety studies or FDA approval, nonstandard manufacturing practices, plus the potential risk of unknown drug-drug interactions while on inpatient medications, the Pharmacy and Therapeutics Committee does not permit the use of "herbal" or natural products of this type within [facility].   ACTION TAKEN: The pharmacy department is unable to verify this order at this time and your patient has been informed of this safety policy. Please reevaluate patient's clinical condition at discharge and address if the herbal or natural product(s) should be resumed at that time.

## 2015-05-04 NOTE — Care Management Note (Signed)
Case Management Note  Patient Details  Name: [name] MRN: [id number] Date of Birth: 1970/11/20  Subjective/Objective:   Pt lives with spouse who is very supportive, has CPAP @ home through [facility], is not currently on O2 through the day.  PT/OT evals pending.                         Expected Discharge Plan:  Home/Self Care  Discharge planning Services  CM Consult  Status of Service:  In process, will continue to follow  Girard Cooter, RN 05/04/2015, 3:33 PM

## 2015-05-04 NOTE — H&P (Addendum)
[facility] History and Physical  [name] [id number] DOB: [birth year] DOA: 05/03/2015  Referring physician: EDP PCP: Lucretia Kern., DO   Chief Complaint: SOB   HPI: [name] is a 45 y.o. female with h/o OSA, HVOS, DM2, patient presents to the ED with c/o SOB.  Symptoms ongoing for the past 3-4 days, started with sneezing and eye watering at that time.  Pulse ox at home has been running in the 80s which is unusual for her.  Had been weaned off of chronic oxygen despite history of HVOS previously.  Patient presented to PCP, due to low O2 sats in office she was sent in to the ED.  Review of Systems: Systems reviewed.  As above, otherwise negative  Past Medical History  Diagnosis Date  . Morbid obesity ([place])   . Obesity hypoventilation syndrome ([place])   . Bipolar disorder ([place])   . Hypoxemia     history of - no home O2  . Cor pulmonale ([place])   . History of pleural effusion   . Spinal stenosis in cervical region   . Acute systolic congestive heart failure ([place])   . Seasonal allergies     current runny nose  . Arthritis     osteoarthritis bilal. knees  . Urinary incontinence   . Abrasion of breast 06/21/2011    left  . Sleep apnea sleep study 07/02/2010    uses CPAP nightly  . Asthma     no per PFT 7/13; reports does not have asthma  . Seizures ([place])     febrile seizure x 1 as a child. Several times  . IBS (irritable bowel syndrome)   . Carpal tunnel syndrome of left wrist 05/2011    Being evaluated for MS  . Prediabetes   . Agoraphobia   . Hyperlipidemia   . Hypertension   . Depression   . Anxiety     panic attacks  . Allergy    Past Surgical History  Procedure Laterality Date  . Wisdom tooth extraction    . Mass excision  08/07/2010    right index  . Carpal tunnel release  06/27/2011    Procedure: CARPAL TUNNEL RELEASE;  Surgeon: Wynonia Sours, MD;  Location: [facility];  Service: Orthopedics;  Laterality: Left;  .  Posterior cervical fusion/foraminotomy  09/11/2011    Procedure: POSTERIOR CERVICAL FUSION/FORAMINOTOMY LEVEL 1;  Surgeon: Erline Levine, MD;  Location: [facility];  Service: Neurosurgery;  Laterality: N/A;  Cervical Three-Four Posteior Cervical Fusion and Decompression.  . Carpal tunnel release  12/19/2011    Procedure: CARPAL TUNNEL RELEASE;  Surgeon: Wynonia Sours, MD;  Location: [facility];  Service: Orthopedics;  Laterality: Right;  . Trigger finger release  12/19/2011    Procedure: RELEASE TRIGGER FINGER/A-1 PULLEY;  Surgeon: Wynonia Sours, MD;  Location: [facility];  Service: Orthopedics;  Laterality: Right;  . Trigger finger release Left 12/31/2012    Procedure: RELEASE A-1 PULLEY LEFT THUMB;  Surgeon: Wynonia Sours, MD;  Location: [facility];  Service: Orthopedics;  Laterality: Left;   Social History:  reports that she has never smoked. She has never used smokeless tobacco. She reports that she drinks alcohol. She reports that she does not use illicit drugs.  Allergies  Allergen Reactions  . Savella [Milnacipran Hcl]     mania    Family History  Problem Relation Age of Onset  . Asthma Mother   . Allergic  rhinitis Mother   . Multiple sclerosis Mother   . Asthma Maternal Grandmother      Prior to Admission medications   Medication Sig Start Date End Date Taking? Authorizing Provider  acetaminophen (TYLENOL) 500 MG tablet Take 2,000 mg by mouth 2 (two) times daily. Taking every day per patient   Yes Historical Provider, MD  albuterol (PROVENTIL HFA;VENTOLIN HFA) 108 (90 Base) MCG/ACT inhaler Inhale 2 puffs into the lungs every 6 (six) hours as needed for wheezing or shortness of breath. 03/24/15  Yes Lucretia Kern, DO  ALPRAZolam Duanne Moron) 1 MG tablet Take 1 mg by mouth 3 (three) times daily.    Yes Historical Provider, MD  Ascorbic Acid (VITAMIN C) 500 MG tablet Take 1,000 mg by mouth daily.    Yes Historical Provider, MD  aspirin 81 MG  tablet Take 81 mg by mouth daily.    Yes Historical Provider, MD  b complex vitamins tablet Take 1 tablet by mouth daily.   Yes Historical Provider, MD  carbamazepine (TEGRETOL) 200 MG tablet Take 200 mg by mouth at bedtime. Take 1 tablet in the morning and 2 tablets at night   Yes Historical Provider, MD  Cholecalciferol (VITAMIN D3) 5000 UNITS TABS Take 3 tablets by mouth daily.    Yes Historical Provider, MD  Chromium Picolinate 500 MCG CAPS Take 1 capsule by mouth daily.   Yes Historical Provider, MD  clonazePAM (KLONOPIN) 1 MG tablet Take 1 mg by mouth 4 (four) times daily.    Yes Historical Provider, MD  furosemide (LASIX) 40 MG tablet Take 2 tablets (80 mg total) by mouth 2 (two) times daily. 10/22/14  Yes Minus Breeding, MD  KLOR-CON M20 20 MEQ tablet TAKE 1 TABLET DAILY 05/24/14  Yes Minus Breeding, MD  lamoTRIgine (LAMICTAL) 200 MG tablet Take 200 mg by mouth 2 (two) times daily.    Yes Historical Provider, MD  losartan (COZAAR) 25 MG tablet Take 1 tablet (25 mg total) by mouth 2 (two) times daily. 06/30/14  Yes Minus Breeding, MD  Lurasidone HCl (LATUDA) 120 MG TABS Take 1 tablet by mouth daily.   Yes Historical Provider, MD  LYRICA 300 MG capsule Take 1 tablet by mouth Twice daily. 03/26/11  Yes Historical Provider, MD  MAGNESIUM PO Take 1 tablet by mouth daily.   Yes Historical Provider, MD  Meloxicam (MOBIC PO) Take 7.5 mg by mouth every 12 (twelve) hours.    Yes Historical Provider, MD  metFORMIN (GLUCOPHAGE) 500 MG tablet TAKE 1 TABLET BY MOUTH TWICE DAILY WITH MEALS 11/01/14  Yes Lucretia Kern, DO  methylphenidate (RITALIN) 10 MG tablet 5-20 mg. Takes 20 mg morning and noon, and 5-10 mg at 4pm   Yes Historical Provider, MD  Multiple Vitamin (MULTIVITAMIN) tablet Take 1 tablet by mouth daily.     Yes Historical Provider, MD  norethindrone-ethinyl estradiol-iron (MICROGESTIN FE 1.5/30) 1.5-30 MG-MCG tablet Take 1 tablet by mouth at bedtime. Takes continuously   Yes Historical Provider, MD   Zinc 50 MG CAPS Take 50 mg by mouth daily.   Yes Historical Provider, MD   Physical Exam: Filed Vitals:   05/03/15 2300 05/04/15 0001  BP: 140/80   Pulse: 83 92  Temp:    Resp: 17 19    BP 140/80 mmHg  Pulse 92  Temp(Src) 98.8 F (37.1 C) (Oral)  Resp 19  SpO2 97%  General Appearance:    Alert, oriented, no distress, appears stated age  Head:    Normocephalic, atraumatic  Eyes:    PERRL, EOMI, sclera non-icteric        Nose:   Nares without drainage or epistaxis. Mucosa, turbinates normal  Throat:   Moist mucous membranes. Oropharynx without erythema or exudate.  Neck:   Supple. No carotid bruits.  No thyromegaly.  No lymphadenopathy.   Back:     No CVA tenderness, no spinal tenderness  Lungs:     Clear to auscultation bilaterally, without wheezes, rhonchi or rales  Chest wall:    No tenderness to palpitation  Heart:    Regular rate and rhythm without murmurs, gallops, rubs  Abdomen:     Soft, non-tender, nondistended, normal bowel sounds, no organomegaly  Genitalia:    deferred  Rectal:    deferred  Extremities:   No clubbing, cyanosis or edema.  Pulses:   2+ and symmetric all extremities  Skin:   Skin color, texture, turgor normal, no rashes or lesions  Lymph nodes:   Cervical, supraclavicular, and axillary nodes normal  Neurologic:   CNII-XII intact. Normal strength, sensation and reflexes      throughout    Labs on Admission:  Basic Metabolic Panel:  Recent Labs Lab 05/03/15 1604  NA 144  K 5.1  CL 100*  CO2 31  GLUCOSE 99  BUN 18  CREATININE 0.98  CALCIUM 8.7*   Liver Function Tests:  Recent Labs Lab 05/03/15 1604  AST 41  ALT 17  ALKPHOS 83  BILITOT 1.0  PROT 6.5  ALBUMIN 3.3*   No results for input(s): LIPASE, AMYLASE in the last 168 hours. No results for input(s): AMMONIA in the last 168 hours. CBC:  Recent Labs Lab 05/03/15 1604  WBC 5.9  NEUTROABS 3.2  HGB 19.8*  HCT 60.8*  MCV 101.8*  PLT 130*   Cardiac Enzymes: No results  for input(s): CKTOTAL, CKMB, CKMBINDEX, TROPONINI in the last 168 hours.  BNP (last 3 results) No results for input(s): PROBNP in the last 8760 hours. CBG: No results for input(s): GLUCAP in the last 168 hours.  Radiological Exams on Admission: Dg Chest 2 View  05/03/2015  CLINICAL DATA:  Shortness of breath for 2 days, history of asthma EXAM: CHEST  2 VIEW COMPARISON:  06/01/2010 FINDINGS: Cardiomegaly is noted. Mild infrahilar bronchitic changes. No infiltrate or pulmonary edema. Limited study by patient's large body habitus. Mild degenerative changes thoracic spine. IMPRESSION: Cardiomegaly. Mild infrahilar bronchitic changes. Limited study by patient's large body habitus. No infiltrate or pulmonary edema. Electronically Signed   By: Lahoma Crocker M.D.   On: 05/03/2015 16:39   Ct Angio Chest Pe W/cm &/or Wo Cm  05/04/2015  CLINICAL DATA:  Hypoxia.  Dyspnea. EXAM: CT ANGIOGRAPHY CHEST WITH CONTRAST TECHNIQUE: Multidetector CT imaging of the chest was performed using the standard protocol during bolus administration of intravenous contrast. Multiplanar CT image reconstructions and MIPs were obtained to evaluate the vascular anatomy. CONTRAST:  100 mL Isovue 370 intravenous COMPARISON:  05/01/2010 FINDINGS: Cardiovascular: Pulmonary arterial opacification is not optimal but it is adequate to exclude large or central pulmonary emboli. Small subsegmental emboli may not be evident on this scan the thoracic aorta is normal in caliber and intact. Lungs: Stable linear scarring in the bases and right middle lobe. Patchy airspace opacity in the right upper lobe may represent acute infectious infiltrate although it is not characterized. Central airways: Patent Effusions: None Lymphadenopathy: None. Mildly prominent nonspecific nodes in the mediastinum and right hilum, perhaps reactive. Esophagus: Unremarkable Upper abdomen: No significant abnormality Musculoskeletal: No  significant skeletal lesion. Moderately severe  degenerative thoracic disc disease. Review of the MIP images confirms the above findings. IMPRESSION: 1. Negative for large or central pulmonary embolus. Small subsegmental emboli may not be visible on this scan. 2. Patchy right upper lobe airspace opacity could represent pneumonia but is not conclusively characterized. Nonspecific nodes in the right hilum and mediastinum could be reactive. There also are opacities in the right middle lobe and in the bases, but these are unchanged and more likely represent scarring. Electronically Signed   By: Andreas Newport M.D.   On: 05/04/2015 00:09    EKG: Independently reviewed.  Assessment/Plan Principal Problem:   Acute on chronic respiratory failure (HCC) Active Problems:   Obesity hypoventilation syndrome (HCC)   Chronic systolic heart failure    CAP (community acquired pneumonia)   DM2 (diabetes mellitus, type 2) ([place])   1. Acute on chronic respiratory failure with hypoxia and hypercapnea due to CAP - 1. PNA pathway 2. Rocephin and azithromycin 3. Cultures pending 4. BIPAP 2. HVOS -  1. See above 2. Patient on BIPAP 3. Chronic systolic CHF 1. Continue home meds including lasix that she takes at home    Code Status: Full  Family Communication: No family in room Disposition Plan: Admit to inpatient   Time spent: 70 min  GARDNER, JARED M. [facility] Pager [phone]  If 7AM-7PM, please contact the day team taking care of the patient [email or web address] Password [facility] 05/04/2015, 12:31 AM

## 2015-05-04 NOTE — Progress Notes (Signed)
Family and patient c/o bipap alarm during the night. Bipap was alarming due to excessive leakage from bipap mask. Mask was adjusted many times and patient would readjust the mask or her body position. Family requested another bipap machine. Machine was changed out to a resmed with oxygen bleed in set at 8lpm to keep Sp02 level >88%

## 2015-05-04 NOTE — Progress Notes (Signed)
[facility] TEAM 1 - Stepdown/ICU TEAM PROGRESS NOTE  [name] [id number] DOB: May 09, 1970 DOA: 05/03/2015 PCP: Lucretia Kern., DO  Admit HPI / Brief Narrative: 45 y.o. female with h/o OSA, OHS, and DM2 who presented to the ED with c/o SOB for 3-4 days, with sneezing and eye watering. Pulse ox at home had been running in the 80s which is unusual for her. Had been weaned off of chronic oxygen. She initially presented to her PCP, but due to low O2 sats in office she was sent in to the ED.  HPI/Subjective: Pt seen for f/u visit.  She is not happy about being admitted, and wants to go home today.  I do not feel this would be wise, and informed her that we need to watch her over night to assure her SOB does not rapidly return, and to determine if she will need home O2 or not.    Assessment/Plan:  Acute on chronic respiratory failure with hypoxia and hypercapnea due to RUL Pneumonia  OHS/SA  Chronic systolic CHF  Polycythemia   HTN  HLD  Bipolar D/O   Morbid obesity - Body mass index is 62.18 kg/(m^2).  Code Status: FULL Family Communication: no family present at time of exam Disposition Plan: need to assess for possible need for O2 at home - encouraged QHS CPAP/BIPAP use - possible d/c home 4/6 if no decompensation over night   Consultants: none  Procedures: none  Antibiotics: Azithro 4/4 > Rocephin 4/4 >  DVT prophylaxis: lovenox   Objective: Blood pressure 135/78, pulse 81, temperature 98.4 F (36.9 C), temperature source Oral, resp. rate 26, height 5\' 5"  (1.651 m), weight 169.5 kg (373 lb 10.9 oz), SpO2 89 %. No intake or output data in the 24 hours ending 05/04/15 0942  Exam: Pt seen for f/u visit.  Data Reviewed: Basic Metabolic Panel:  Recent Labs Lab 05/03/15 1604  NA 144  K 5.1  CL 100*  CO2 31  GLUCOSE 99  BUN 18  CREATININE 0.98  CALCIUM 8.7*    CBC:  Recent Labs Lab 05/03/15 1604  WBC 5.9  NEUTROABS 3.2  HGB 19.8*  HCT  60.8*  MCV 101.8*  PLT 130*    Liver Function Tests:  Recent Labs Lab 05/03/15 1604  AST 41  ALT 17  ALKPHOS 83  BILITOT 1.0  PROT 6.5  ALBUMIN 3.3*   CBG:  Recent Labs Lab 05/04/15 0829  GLUCAP 168*    Recent Results (from the past 240 hour(s))  MRSA PCR Screening     Status: None   Collection Time: 05/04/15  6:39 AM  Result Value Ref Range Status   MRSA by PCR NEGATIVE NEGATIVE Final    Comment:        The GeneXpert MRSA Assay (FDA approved for NASAL specimens only), is one component of a comprehensive MRSA colonization surveillance program. It is not intended to diagnose MRSA infection nor to guide or monitor treatment for MRSA infections.      Studies:   Recent x-ray studies have been reviewed in detail by the Attending Physician  Scheduled Meds:  Scheduled Meds: . acetaminophen  1,000 mg Oral BID  . ALPRAZolam  1 mg Oral TID  . aspirin EC  81 mg Oral Daily  . azithromycin  500 mg Oral Q24H  . B-complex with vitamin C  1 tablet Oral Daily  . carbamazepine  200 mg Oral QHS  . cefTRIAXone (ROCEPHIN)  IV  2 g Intravenous QHS  .  clonazePAM  1 mg Oral QID  . enoxaparin (LOVENOX) injection  40 mg Subcutaneous Daily  . furosemide  80 mg Oral BID  . lamoTRIgine  200 mg Oral BID  . losartan  25 mg Oral BID  . lurasidone  120 mg Oral Daily  . magnesium oxide  400 mg Oral Daily  . meloxicam  7.5 mg Oral Q12H  . metFORMIN  500 mg Oral BID WC  . methylphenidate  20 mg Oral BID WC   And  . methylphenidate  5-10 mg Oral QAC supper  . multivitamin with minerals  1 tablet Oral Daily  . norethindrone-ethinyl estradiol-iron  1 tablet Oral QHS  . potassium chloride SA  20 mEq Oral Daily  . pregabalin  300 mg Oral BID  . vitamin C  1,000 mg Oral Daily  . zinc sulfate  220 mg Oral Daily    Time spent on care of this patient: No charge   Cherene Altes , MD   [facility] Office  [phone] Pager - Text Page per Amion as per  below:  On-Call/Text Page:      [email or web address]      password [email or web address]  If 7PM-7AM, please contact night-coverage [email or web address] Password [email or web address] 05/04/2015, 9:42 AM   LOS: 0 days

## 2015-05-04 NOTE — Progress Notes (Signed)
RN requested patient calls for assistance when ambulating to restroom, as she has oxygen and cardiac monitoring.  Patient and patient's husband requested RN show them how to dis-assemble equipment if restroom needs become emergent.  RN advised patient to let RN know as soon as possible when she needs to use restroom and will make every attempt to go to room to assist patient as quickly as possible.  RN requested patient and patient's husband please respect our safety precautions and to allow [place] to assist.  Will continue to monitor. [place], Ardeth Sportsman

## 2015-05-05 DIAGNOSIS — I10 Essential (primary) hypertension: Secondary | ICD-10-CM

## 2015-05-05 DIAGNOSIS — F319 Bipolar disorder, unspecified: Secondary | ICD-10-CM

## 2015-05-05 DIAGNOSIS — J9622 Acute and chronic respiratory failure with hypercapnia: Secondary | ICD-10-CM

## 2015-05-05 DIAGNOSIS — D45 Polycythemia vera: Secondary | ICD-10-CM

## 2015-05-05 DIAGNOSIS — E662 Morbid (severe) obesity with alveolar hypoventilation: Secondary | ICD-10-CM

## 2015-05-05 DIAGNOSIS — F411 Generalized anxiety disorder: Secondary | ICD-10-CM

## 2015-05-05 DIAGNOSIS — J9621 Acute and chronic respiratory failure with hypoxia: Secondary | ICD-10-CM

## 2015-05-05 DIAGNOSIS — F329 Major depressive disorder, single episode, unspecified: Secondary | ICD-10-CM

## 2015-05-05 DIAGNOSIS — J189 Pneumonia, unspecified organism: Principal | ICD-10-CM

## 2015-05-05 DIAGNOSIS — G4733 Obstructive sleep apnea (adult) (pediatric): Secondary | ICD-10-CM

## 2015-05-05 DIAGNOSIS — I5022 Chronic systolic (congestive) heart failure: Secondary | ICD-10-CM

## 2015-05-05 LAB — COMPREHENSIVE METABOLIC PANEL
ALBUMIN: 2.6 g/dL — AB (ref 3.5–5.0)
ALK PHOS: 75 U/L (ref 38–126)
ALT: 20 U/L (ref 14–54)
ANION GAP: 12 (ref 5–15)
AST: 29 U/L (ref 15–41)
BUN: 15 mg/dL (ref 6–20)
CALCIUM: 8.4 mg/dL — AB (ref 8.9–10.3)
CO2: 35 mmol/L — AB (ref 22–32)
Chloride: 99 mmol/L — ABNORMAL LOW (ref 101–111)
Creatinine, Ser: 1.01 mg/dL — ABNORMAL HIGH (ref 0.44–1.00)
GFR calc Af Amer: >60 mL/min (ref >60)
GFR calc non Af Amer: >60 mL/min (ref >60)
GLUCOSE: 123 mg/dL — AB (ref 65–99)
Potassium: 4.7 mmol/L (ref 3.5–5.1)
SODIUM: 146 mmol/L — AB (ref 135–145)
Total Bilirubin: 0.4 mg/dL (ref 0.3–1.2)
Total Protein: 5.5 g/dL — ABNORMAL LOW (ref 6.5–8.1)

## 2015-05-05 LAB — CBC
HCT: 56.8 % — ABNORMAL HIGH (ref 36.0–46.0)
HEMOGLOBIN: 17.5 g/dL — AB (ref 12.0–15.0)
MCH: 31.9 pg (ref 26.0–34.0)
MCHC: 30.8 g/dL (ref 30.0–36.0)
MCV: 103.5 fL — ABNORMAL HIGH (ref 78.0–100.0)
Platelets: 141 10*3/uL — ABNORMAL LOW (ref 150–400)
RBC: 5.49 MIL/uL — AB (ref 3.87–5.11)
RDW: 15.9 % — ABNORMAL HIGH (ref 11.5–15.5)
WBC: 6.7 10*3/uL (ref 4.0–10.5)

## 2015-05-05 MED ORDER — CEFUROXIME AXETIL 500 MG PO TABS
500.0000 mg | ORAL_TABLET | Freq: Two times a day (BID) | ORAL | Status: DC
  Administered 2015-05-05 – 2015-05-06 (×3): 500 mg via ORAL
  Filled 2015-05-05 (×4): qty 1

## 2015-05-05 NOTE — Discharge Summary (Signed)
Physician Discharge Summary  [name] [id number] DOB: January 24, 1971 DOA: 05/03/2015  PCP: Jessica Benton R., DO  Admit date: 05/03/2015 Discharge date: 05/06/2015  Time spent: 45 minutes  Recommendations for Outpatient Follow-up:  Acute on chronic respiratory failure with hypoxia and hypercapnea/CAP RUL  -Complete 7 day course of antibiotics. Change to appropriate PO meds -SATURATION QUALIFICATIONS: (This note is used to comply with regulatory documentation for home oxygen) Patient Saturations on Room Air at Rest = 91% Patient Saturations on Room Air while Ambulating = 86% Patient Saturations on 2 Liters of oxygen while Ambulating = 89% Patient Saturations on 3 Liters of oxygen while Ambulating = 90% Patient Saturations on 4 Liters of oxygen while Ambulating = 94% Please briefly explain why patient needs home oxygen: Patient desaturates with mobility. -Ordered patient's home O2 -Schedule appointment with Dr. Elsworth Soho PCCM 1-2 weeks postdischarge, acute on chronic respiratory failure secondary to pneumonia, OHS/OSA  Bacteremia positive coag negative staph 1/2 -Most likely contaminant will DC Vancomycin, no further workup required.   OHS/OSA -Continue CPAP  Chronic Systolic and Diastolic CHF -Echocardiogram shows chronic systolic and diastolic CHF see results below  -Lasix 80 mg BID -Losartan 25 mg BID -Strict in and out since admission +1.2 L -Daily weight Filed Weights   05/04/15 0700 05/06/15 0500  Weight: 169.5 kg (373 lb 10.9 oz) 171.324 kg (377 lb 11.2 oz)  -patient should weigh herself daily and record in journal. If she gains more than 5 pounds in 24 hours contact PCP or cardiologist for instructions on increasing diuretic.  HTN -See systolic CHF  Pulmonary hypertension -See CHF  Polycythemia Vera -Most likely secondary given patient's hypoxia and the fact she is not using home O2, and is only occasionally monitoring her SPO2 -Patient counseled she needs to  follow-up with PCP and pulmonologist to discuss workup/monitoring   HLD -Lipid panel Not within AHA/ADA guidelines -Start Lipitor 20 mg daily. PCP to titrate to effect  Bipolar D/O/Depression/Anxiety -Xanax 1 mg TID -Clonazepam 1 mg TID -Latuda 120 mg daily   Morbid obesity - Body mass index is 62.18 kg/(m^2).    Discharge Diagnoses:  Principal Problem:   Acute on chronic respiratory failure (HCC) Active Problems:   Obesity hypoventilation syndrome (HCC)   Chronic systolic heart failure    CAP (community acquired pneumonia)   DM2 (diabetes mellitus, type 2) ([place])   Acute on chronic respiratory failure with hypoxia and hypercapnia (HCC)   OSA on CPAP   Essential hypertension   Polycythemia vera (HCC)   Bipolar I disorder (HCC)   Depression   Anxiety state   Chronic combined systolic and diastolic CHF (congestive heart failure) (HCC)   Pulmonary hypertension ([place])   Discharge Condition: Stable  Diet recommendation: Heart healthy/American diabetic Association  Filed Weights   05/04/15 0700 05/06/15 0500  Weight: 169.5 kg (373 lb 10.9 oz) 171.324 kg (377 lb 11.2 oz)    History of present illness:  45 y.o. WF PMHx Bipolar Disorder, Agoraphobia,Depression, Anxiety (panic attacks),, OSA, OHS, Asthma Cor Pulmonale, HTN, Chronic Systolic CHF, and DM Type 2   who presented to the ED with c/o SOB for 3-4 days, with sneezing and eye watering. Pulse ox at home had been running in the 80s which is unusual for her. Had been weaned off of chronic oxygen. She initially presented to her PCP, but due to low O2 sats in office she was sent in to the ED. In this hospitalization patient was treated for acute on chronic respiratory failure with  hypoxia and hypercapnia secondary to CAP. Patient was also found to have polycythemia vera most likely secondary to chronic hypoxia which will need further workup. Patient will be discharged on home O2 and CPAP, with instructions to follow-up with  Dr. Elsworth Soho [facility] M, and her PCP.   Procedures: 4/4 PCXR;Cardiomegaly. Mild infrahilar bronchitic changes 4/4 CT angiogram PE protocol;Negative PE - Patchy RUL opacity could represent pneumonia but is not conclusively characterized.- Nonspecific nodes Rt hilum and mediastinum could be reactive. -Opacities in the RML and in the bases,  unchanged and more likely represent scarring.   Consultations: NA  Culture 4/5 blood right forearm positive coag negative staph 1/2 (contaminant) 4/5 blood right hand NGTD 4/5 MRSA by PCR negative   Antibiotics  Azithro 4/4 >> Rocephin 4/4 > 4/6 Vancomycin 4/5>> 4/7 Ceftin 4/6>>    Discharge Exam: Filed Vitals:   05/06/15 0500 05/06/15 0839 05/06/15 0857 05/06/15 1132  BP:   113/76 137/86  Pulse:  72  79  Temp:   98 F (36.7 C) 98 F (36.7 C)  TempSrc:   Axillary Oral  Resp:  24  22  Height:      Weight: 171.324 kg (377 lb 11.2 oz)     SpO2:  96%  90%    General: A/O 4, positive acute respiratory distress requiring 2-3 L O2 via [place] to maintain appropriate SPO2 Eyes: Negative headache, negative scleral hemorrhage ENT: Negative Runny nose, negative gingival bleeding, Neck:  Negative scars, masses, torticollis, lymphadenopathy, JVD Lungs: extremely difficult to hear breath sounds, but appeared Clear to auscultation bilaterally positive expiratory wheezes, negative Crackles Cardiovascular: Regular rate and rhythm without murmur gallop or rub normal S1 and S2 Abdomen: Morbidly obese, distended, positive soft, bowel sounds, no rebound, no ascites, no appreciable mass Extremities: No significant cyanosis, clubbing, or edema bilateral lower extremities Psychiatric:  Positive depression, ulcerative anxiety, negative fatigue, negative mania  Neurologic:  Cranial nerves II through XII intact, tongue/uvula midline, all extremities muscle strength 5/5, sensation intact throughout, negative dysarthria, negative expressive aphasia, negative receptive  aphasia.   Discharge Instructions     Medication List    TAKE these medications        acetaminophen 500 MG tablet  Commonly known as:  TYLENOL  Take 2,000 mg by mouth 2 (two) times daily. Taking every day per patient     albuterol 108 (90 Base) MCG/ACT inhaler  Commonly known as:  PROVENTIL HFA;VENTOLIN HFA  Inhale 2 puffs into the lungs every 6 (six) hours as needed for wheezing or shortness of breath.     ALPRAZolam 1 MG tablet  Commonly known as:  XANAX  Take 1 mg by mouth 3 (three) times daily.     aspirin 81 MG tablet  Take 81 mg by mouth daily.     atorvastatin 20 MG tablet  Commonly known as:  LIPITOR  Take 1 tablet (20 mg total) by mouth daily at 6 PM.     azithromycin 500 MG tablet  Commonly known as:  ZITHROMAX  Take 1 tablet (500 mg total) by mouth daily.     b complex vitamins tablet  Take 1 tablet by mouth daily.     carbamazepine 200 MG tablet  Commonly known as:  TEGRETOL  Take 200 mg by mouth at bedtime. Take 1 tablet in the morning and 2 tablets at night     cefUROXime 500 MG tablet  Commonly known as:  CEFTIN  Take 1 tablet (500 mg total) by mouth 2 (two)  times daily with a meal.     Chromium Picolinate 500 MCG Caps  Take 1 capsule by mouth daily.     clonazePAM 1 MG tablet  Commonly known as:  KLONOPIN  Take 1 tablet (1 mg total) by mouth 3 (three) times daily.     furosemide 40 MG tablet  Commonly known as:  LASIX  Take 2 tablets (80 mg total) by mouth 2 (two) times daily.     KLOR-CON M20 20 MEQ tablet  Generic drug:  potassium chloride SA  TAKE 1 TABLET DAILY     lamoTRIgine 200 MG tablet  Commonly known as:  LAMICTAL  Take 200 mg by mouth 2 (two) times daily.     LATUDA 120 MG Tabs  Generic drug:  Lurasidone HCl  Take 1 tablet by mouth daily.     losartan 25 MG tablet  Commonly known as:  COZAAR  Take 1 tablet (25 mg total) by mouth 2 (two) times daily.     LYRICA 300 MG capsule  Generic drug:  pregabalin  Take 1 tablet  by mouth Twice daily.     MAGNESIUM PO  Take 1 tablet by mouth daily.     metFORMIN 500 MG tablet  Commonly known as:  GLUCOPHAGE  TAKE 1 TABLET BY MOUTH TWICE DAILY WITH MEALS     methylphenidate 10 MG tablet  Commonly known as:  RITALIN  5-20 mg. Takes 20 mg morning and noon, and 5-10 mg at 4pm     MICROGESTIN FE 1.5/30 1.5-30 MG-MCG tablet  Generic drug:  norethindrone-ethinyl estradiol-iron  Take 1 tablet by mouth at bedtime. Takes continuously     MOBIC PO  Take 7.5 mg by mouth every 12 (twelve) hours.     multivitamin tablet  Take 1 tablet by mouth daily.     vitamin C 500 MG tablet  Commonly known as:  ASCORBIC ACID  Take 1,000 mg by mouth daily.     Vitamin D3 5000 units Tabs  Take 3 tablets by mouth daily.     Zinc 50 MG Caps  Take 50 mg by mouth daily.       Allergies  Allergen Reactions  . Savella [Milnacipran Hcl]     mania   Follow-up Information    Follow up with Rigoberto Noel., MD. Schedule an appointment as soon as possible for a visit in 2 weeks.   Specialty:  Pulmonary Disease   Why:  Schedule appointment with Dr. Elsworth Soho PCCM 1-2 weeks postdischarge, acute on chronic respiratory failure secondary to pneumonia, OHS/OSA   Contact information:   [address] [address] [phone]       Follow up with Jessica Benton R., DO. Schedule an appointment as soon as possible for a visit in 1 week.   Specialty:  Family Medicine   Why:  Schedule follow-up in one week with Dr. Colin Benton for CAP, HLD, acute on chronic respiratory failure   Contact information:   [place] [address] [phone]        The results of significant diagnostics from this hospitalization (including imaging, microbiology, ancillary and laboratory) are listed below for reference.    Significant Diagnostic Studies: Dg Chest 2 View  05/03/2015  CLINICAL DATA:  Shortness of breath for 2 days, history of asthma EXAM: CHEST  2 VIEW COMPARISON:   06/01/2010 FINDINGS: Cardiomegaly is noted. Mild infrahilar bronchitic changes. No infiltrate or pulmonary edema. Limited study by patient's large body habitus. Mild  degenerative changes thoracic spine. IMPRESSION: Cardiomegaly. Mild infrahilar bronchitic changes. Limited study by patient's large body habitus. No infiltrate or pulmonary edema. Electronically Signed   By: Lahoma Crocker M.D.   On: 05/03/2015 16:39   Ct Angio Chest Pe W/cm &/or Wo Cm  05/04/2015  CLINICAL DATA:  Hypoxia.  Dyspnea. EXAM: CT ANGIOGRAPHY CHEST WITH CONTRAST TECHNIQUE: Multidetector CT imaging of the chest was performed using the standard protocol during bolus administration of intravenous contrast. Multiplanar CT image reconstructions and MIPs were obtained to evaluate the vascular anatomy. CONTRAST:  100 mL Isovue 370 intravenous COMPARISON:  05/01/2010 FINDINGS: Cardiovascular: Pulmonary arterial opacification is not optimal but it is adequate to exclude large or central pulmonary emboli. Small subsegmental emboli may not be evident on this scan the thoracic aorta is normal in caliber and intact. Lungs: Stable linear scarring in the bases and right middle lobe. Patchy airspace opacity in the right upper lobe may represent acute infectious infiltrate although it is not characterized. Central airways: Patent Effusions: None Lymphadenopathy: None. Mildly prominent nonspecific nodes in the mediastinum and right hilum, perhaps reactive. Esophagus: Unremarkable Upper abdomen: No significant abnormality Musculoskeletal: No significant skeletal lesion. Moderately severe degenerative thoracic disc disease. Review of the MIP images confirms the above findings. IMPRESSION: 1. Negative for large or central pulmonary embolus. Small subsegmental emboli may not be visible on this scan. 2. Patchy right upper lobe airspace opacity could represent pneumonia but is not conclusively characterized. Nonspecific nodes in the right hilum and mediastinum could  be reactive. There also are opacities in the right middle lobe and in the bases, but these are unchanged and more likely represent scarring. Electronically Signed   By: Andreas Newport M.D.   On: 05/04/2015 00:09    Microbiology: Recent Results (from the past 240 hour(s))  Blood culture (routine x 2)     Status: None   Collection Time: 05/04/15 12:43 AM  Result Value Ref Range Status   Specimen Description BLOOD RIGHT FOREARM  Final   Special Requests BOTTLES DRAWN AEROBIC AND ANAEROBIC 5ML  Final   Culture  Setup Time   Final    GRAM POSITIVE COCCI IN CLUSTERS IN BOTH AEROBIC AND ANAEROBIC BOTTLES CRITICAL RESULT CALLED TO, READ BACK BY AND VERIFIED WITH: J MILFORD RN [id number] 05/04/15 A BROWNING    Culture   Final    STAPHYLOCOCCUS SPECIES (COAGULASE NEGATIVE) THE SIGNIFICANCE OF ISOLATING THIS ORGANISM FROM A SINGLE SET OF BLOOD CULTURES WHEN MULTIPLE SETS ARE DRAWN IS UNCERTAIN. PLEASE NOTIFY THE MICROBIOLOGY DEPARTMENT WITHIN ONE WEEK IF SPECIATION AND SENSITIVITIES ARE REQUIRED.    Report Status 05/06/2015 FINAL  Final  Blood culture (routine x 2)     Status: None (Preliminary result)   Collection Time: 05/04/15 12:48 AM  Result Value Ref Range Status   Specimen Description BLOOD RIGHT HAND  Final   Special Requests AEROBIC BOTTLE ONLY 5ML  Final   Culture NO GROWTH 2 DAYS  Final   Report Status PENDING  Incomplete  MRSA PCR Screening     Status: None   Collection Time: 05/04/15  6:39 AM  Result Value Ref Range Status   MRSA by PCR NEGATIVE NEGATIVE Final    Comment:        The GeneXpert MRSA Assay (FDA approved for NASAL specimens only), is one component of a comprehensive MRSA colonization surveillance program. It is not intended to diagnose MRSA infection nor to guide or monitor treatment for MRSA infections.      Labs:  Basic Metabolic Panel:  Recent Labs Lab 05/03/15 1604 05/05/15 0401 05/06/15 0300 05/06/15 0830  NA 144 146* 140  --   K 5.1 4.7 6.0* 5.0   CL 100* 99* 100*  --   CO2 31 35* 27  --   GLUCOSE 99 123* 114*  --   BUN 18 15 16   --   CREATININE 0.98 1.01* 0.82  --   CALCIUM 8.7* 8.4* 8.0*  --   MG  --   --  2.2  --    Liver Function Tests:  Recent Labs Lab 05/03/15 1604 05/05/15 0401 05/06/15 0300  AST 41 29 52*  ALT 17 20 18   ALKPHOS 83 75 70  BILITOT 1.0 0.4 1.8*  PROT 6.5 5.5* 5.2*  ALBUMIN 3.3* 2.6* 2.6*   No results for input(s): LIPASE, AMYLASE in the last 168 hours. No results for input(s): AMMONIA in the last 168 hours. CBC:  Recent Labs Lab 05/03/15 1604 05/05/15 0401 05/06/15 0300  WBC 5.9 6.7 6.7  NEUTROABS 3.2  --  3.7  HGB 19.8* 17.5* 17.6*  HCT 60.8* 56.8* 55.1*  MCV 101.8* 103.5* 100.5*  PLT 130* 141* 190   Cardiac Enzymes: No results for input(s): CKTOTAL, CKMB, CKMBINDEX, TROPONINI in the last 168 hours. BNP: BNP (last 3 results)  Recent Labs  05/03/15 1604  BNP 120.8*    ProBNP (last 3 results) No results for input(s): PROBNP in the last 8760 hours.  CBG:  Recent Labs Lab 05/04/15 0829 05/04/15 1223  GLUCAP 168* 85       Signed:  Dia Crawford, MD [facility] [phone] pager

## 2015-05-05 NOTE — Progress Notes (Addendum)
[facility] TEAM 1 - Stepdown/ICU TEAM Progress Note  [name] [id number] DOB: 08/01/1970 DOA: 05/03/2015 PCP: Lucretia Kern., DO  Admit HPI / Brief Narrative: 45 y.o. WF PMHx Bipolar Disorder, Agoraphobia,Depression, Anxiety (panic attacks), OSA, OHS, Asthma Cor Pulmonale, HTN, Chronic Systolic CHF, and DM Type 2   who presented to the ED with c/o SOB for 3-4 days, with sneezing and eye watering. Pulse ox at home had been running in the 80s which is unusual for her. Had been weaned off of chronic oxygen. She initially presented to her PCP, but due to low O2 sats in office she was sent in to the ED.   HPI/Subjective: 4/6 A/O 4, acute respiratory distress  Assessment/Plan: Acute on chronic respiratory failure with hypoxia and hypercapnea/CAP RUL  -Complete 7 day course of antibiotics. Change to appropriate PO meds -SATURATION QUALIFICATIONS: (This note is used to comply with regulatory documentation for home oxygen) Patient Saturations on Room Air at Rest = 91% Patient Saturations on Room Air while Ambulating = 86% Patient Saturations on 2 Liters of oxygen while Ambulating = 89% Patient Saturations on 3 Liters of oxygen while Ambulating = 90% Patient Saturations on 4 Liters of oxygen while Ambulating = 94% Please briefly explain why patient needs home oxygen: Patient desaturates with mobility. -Ordered patient's home O2 -Schedule appointment with Dr. Elsworth Soho PCCM 1-2 weeks postdischarge, acute on chronic respiratory failure secondary to pneumonia, OHS/OSA  Bacteremia positive GPC clusters -Continue vancomycin until speciation and susceptibilities finalized  OHS/OSA -Continue CPAP   Chronic Systolic CHF -Echocardiogram pending -Lasix 80 mg BID -Losartan 25 mg BID -Strict in and out -Daily weight Filed Weights   05/04/15 0700 05/06/15 0500  Weight: 169.5 kg (373 lb 10.9 oz) 171.324 kg (377 lb 11.2 oz)   HTN -See systolic CHF  Polycythemia Vera -Most likely  secondary given patient's hypoxia and the fact she is not using home O2, and is only occasionally monitoring her SPO2  HLD -Lipid panel pending  Bipolar D/O/Depression/Anxiety -Xanax 1 mg TID -Clonazepam 1 mg TID -Latuda 120 mg daily   Morbid obesity - Body mass index is 62.18 kg/(m^2).   Code Status: FULL Family Communication: no family present at time of exam Disposition Plan: Discharge in next 24-48 hours    Consultants: NA   Procedures: 4/4 PCXR;Cardiomegaly. Mild infrahilar bronchitic changes 4/4 CT angiogram PE protocol;Negative PE - Patchy RUL opacity could represent pneumonia but is not conclusively characterized.- Nonspecific nodes Rt hilum and mediastinum could be reactive. -Opacities in the RML and in the bases,  unchanged and more likely represent scarring.   Culture 4/5 blood right forearm positive GPC clusters 4/5 blood right hand NGTD 4/5 MRSA by PCR negative   Antibiotics  Azithro 4/4 >> Rocephin 4/4 > 4/6 Ceftin 4/6>>  DVT prophylaxis: Lovenox   Devices    LINES / TUBES:      Continuous Infusions:   Objective: VITAL SIGNS: Temp: 97.7 F (36.5 C) (04/06 1131) Temp Source: Oral (04/06 1131) BP: 112/60 mmHg (04/06 1131) Pulse Rate: 87 (04/06 1131) SPO2; FIO2:   Intake/Output Summary (Last 24 hours) at 05/05/15 1507 Last data filed at 05/05/15 0815  Gross per 24 hour  Intake    290 ml  Output      0 ml  Net    290 ml     Exam: General: A/O 4, positive acute respiratory distress requiring 2-3 L O2 via [place] to maintain appropriate SPO2 Eyes: Negative headache, negative scleral hemorrhage ENT: Negative  Runny nose, negative gingival bleeding, Neck: Negative scars, masses, torticollis, lymphadenopathy, JVD Lungs: extremely difficult to hear breath sounds, but appeared Clear to auscultation bilaterally positive expiratory wheezes, negative Crackles Cardiovascular: Regular rate and rhythm without murmur gallop or rub normal S1 and  S2 Abdomen: Morbidly obese, distended, positive soft, bowel sounds, no rebound, no ascites, no appreciable mass Extremities: No significant cyanosis, clubbing, or edema bilateral lower extremities Psychiatric: Positive depression, ulcerative anxiety, negative fatigue, negative mania  Neurologic: Cranial nerves II through XII intact, tongue/uvula midline, all extremities muscle strength 5/5, sensation intact throughout, negative dysarthria, negative expressive aphasia, negative receptive aphasia.    Data Reviewed: Basic Metabolic Panel:  Recent Labs Lab 05/03/15 1604 05/05/15 0401  NA 144 146*  K 5.1 4.7  CL 100* 99*  CO2 31 35*  GLUCOSE 99 123*  BUN 18 15  CREATININE 0.98 1.01*  CALCIUM 8.7* 8.4*   Liver Function Tests:  Recent Labs Lab 05/03/15 1604 05/05/15 0401  AST 41 29  ALT 17 20  ALKPHOS 83 75  BILITOT 1.0 0.4  PROT 6.5 5.5*  ALBUMIN 3.3* 2.6*   No results for input(s): LIPASE, AMYLASE in the last 168 hours. No results for input(s): AMMONIA in the last 168 hours. CBC:  Recent Labs Lab 05/03/15 1604 05/05/15 0401  WBC 5.9 6.7  NEUTROABS 3.2  --   HGB 19.8* 17.5*  HCT 60.8* 56.8*  MCV 101.8* 103.5*  PLT 130* 141*   Cardiac Enzymes: No results for input(s): CKTOTAL, CKMB, CKMBINDEX, TROPONINI in the last 168 hours. BNP (last 3 results)  Recent Labs  05/03/15 1604  BNP 120.8*    ProBNP (last 3 results) No results for input(s): PROBNP in the last 8760 hours.  CBG:  Recent Labs Lab 05/04/15 0829 05/04/15 1223  GLUCAP 168* 85    Recent Results (from the past 240 hour(s))  Blood culture (routine x 2)     Status: None (Preliminary result)   Collection Time: 05/04/15 12:43 AM  Result Value Ref Range Status   Specimen Description BLOOD RIGHT FOREARM  Final   Special Requests BOTTLES DRAWN AEROBIC AND ANAEROBIC 5ML  Final   Culture  Setup Time   Final    GRAM POSITIVE COCCI IN CLUSTERS IN BOTH AEROBIC AND ANAEROBIC BOTTLES CRITICAL  RESULT CALLED TO, READ BACK BY AND VERIFIED WITH: J MILFORD RN [id number] 05/04/15 A BROWNING    Culture   Final    GRAM POSITIVE COCCI CULTURE REINCUBATED FOR BETTER GROWTH    Report Status PENDING  Incomplete  Blood culture (routine x 2)     Status: None (Preliminary result)   Collection Time: 05/04/15 12:48 AM  Result Value Ref Range Status   Specimen Description BLOOD RIGHT HAND  Final   Special Requests AEROBIC BOTTLE ONLY 5ML  Final   Culture NO GROWTH 1 DAY  Final   Report Status PENDING  Incomplete  MRSA PCR Screening     Status: None   Collection Time: 05/04/15  6:39 AM  Result Value Ref Range Status   MRSA by PCR NEGATIVE NEGATIVE Final    Comment:        The GeneXpert MRSA Assay (FDA approved for NASAL specimens only), is one component of a comprehensive MRSA colonization surveillance program. It is not intended to diagnose MRSA infection nor to guide or monitor treatment for MRSA infections.      Studies:  Recent x-ray studies have been reviewed in detail by the Attending Physician  Scheduled Meds:  Scheduled  Meds: . acetaminophen  1,000 mg Oral BID  . ALPRAZolam  1 mg Oral TID  . antiseptic oral rinse  7 mL Mouth Rinse BID  . aspirin EC  81 mg Oral Daily  . azithromycin  500 mg Oral Q24H  . B-complex with vitamin C  1 tablet Oral Daily  . carbamazepine  200 mg Oral QHS  . cefUROXime  500 mg Oral BID WC  . clonazePAM  1 mg Oral TID  . enoxaparin (LOVENOX) injection  80 mg Subcutaneous Daily  . furosemide  80 mg Oral BID  . lamoTRIgine  200 mg Oral BID  . losartan  25 mg Oral BID  . lurasidone  120 mg Oral Daily  . magnesium oxide  400 mg Oral Daily  . meloxicam  7.5 mg Oral Q12H  . metFORMIN  500 mg Oral BID WC  . methylphenidate  20 mg Oral BID WC  . multivitamin with minerals  1 tablet Oral Daily  . norethindrone-ethinyl estradiol-iron  1 tablet Oral QHS  . potassium chloride SA  20 mEq Oral Daily  . pregabalin  300 mg Oral BID  . vancomycin  1,750 mg  Intravenous Q12H  . vitamin C  1,000 mg Oral Daily  . zinc sulfate  220 mg Oral Daily    Time spent on care of this patient: 40 mins   Ananias Kolander, Geraldo Docker , MD  [facility] Office  [phone] Pager [phone]  On-Call/Text Page:      [email or web address]      password [email or web address]  If 7PM-7AM, please contact night-coverage [email or web address] Password [email or web address] 05/05/2015, 3:07 PM   LOS: 1 day   Care during the described time interval was provided by me .  I have reviewed this patient's available data, including medical history, events of note, physical examination, and all test results as part of my evaluation. I have personally reviewed and interpreted all radiology studies.   Dia Crawford, MD [phone] Pager

## 2015-05-05 NOTE — Progress Notes (Signed)
SATURATION QUALIFICATIONS: (This note is used to comply with regulatory documentation for home oxygen)  Patient Saturations on Room Air at Rest = 91%  Patient Saturations on Room Air while Ambulating = 86%  Patient Saturations on 2 Liters of oxygen while Ambulating = 89% Patient Saturations on 3 Liters of oxygen while Ambulating = 90% Patient Saturations on 4 Liters of oxygen while Ambulating = 94%  Please briefly explain why patient needs home oxygen: Patient desaturates with mobility.  Alben Deeds, [place] DPT  [phone]

## 2015-05-05 NOTE — Evaluation (Signed)
Occupational Therapy Evaluation and Discharge Summary Patient Details Name: [name] MRN: [id number] DOB: 09-17-1970 Today's Date: 05/05/2015    History of Present Illness Ptis a 45 yo female admitted w h/o HVOS, OSA, DM2 adn bipolar d/o who now presents with SOB and has PNA and CHF.  Pt with O2 sats in 80s on arrival on RA.   Clinical Impression   Pt admitted with the above diagnosis and overall is doing well with adls. Pt does have SOB and O2 sats dropped to high 80s on RA.  When 2L of O2 replaced pt up to low 90s.  Pt instructed in pursed lip breathing to assist with O2 sats and simple energy conservation ideas for home were reviewed and energy conservation handout provided.  Feel pt does not need further OT at this time due to being fairly independent with adls and all education complete.  Husband there for education.  No further acute OT needs.    Follow Up Recommendations  No OT follow up;Supervision - Intermittent    Equipment Recommendations  None recommended by OT    Recommendations for Other Services       Precautions / Restrictions Precautions Precautions: None Precaution Comments: monitor O2 saturations Restrictions Weight Bearing Restrictions: No      Mobility Bed Mobility               General bed mobility comments: Pt in chair on arrival.  Transfers Overall transfer level: Modified independent Equipment used: None             General transfer comment: Pt safe with speed and hand position of basic transfers.    Balance Overall balance assessment: No apparent balance deficits (not formally assessed)                                          ADL Overall ADL's : At baseline                                       General ADL Comments: Pt appears to be very close to baseline with all adls.  Pt's major issue is SOB and fatigue during adls due to PNA..     Vision Vision Assessment?: No apparent visual  deficits   Perception     Praxis      Pertinent Vitals/Pain Pain Assessment: No/denies pain     Hand Dominance Right   Extremity/Trunk Assessment Upper Extremity Assessment Upper Extremity Assessment: Overall WFL for tasks assessed   Lower Extremity Assessment Lower Extremity Assessment: Defer to PT evaluation   Cervical / Trunk Assessment Cervical / Trunk Assessment: Other exceptions (has had cervical surgery in 2013) Cervical / Trunk Exceptions: cervical surgery in 2013   Communication Communication Communication: No difficulties   Cognition Arousal/Alertness: Awake/alert Behavior During Therapy: WFL for tasks assessed/performed Overall Cognitive Status: Within Functional Limits for tasks assessed                     General Comments       Exercises       Shoulder Instructions      Home Living Family/patient expects to be discharged to:: Private residence Living Arrangements: Spouse/significant other Available Help at Discharge: Family;Available 24 hours/day Type of Home: House Home Access: Stairs to enter [employer]  of Steps: 2 Entrance Stairs-Rails: Right Home Layout: One level     Bathroom Shower/Tub: Tub/shower unit Shower/tub characteristics: Architectural technologist: Standard Bathroom Accessibility: Yes   Home Equipment: None   Additional Comments: has had home O2 4 yaers ago.      Prior Functioning/Environment Level of Independence: Independent        Comments: Pt agoraphobic so does not leave house unless necessary.    OT Diagnosis:     OT Problem List:     OT Treatment/Interventions:      OT Goals(Current goals can be found in the care plan section) Acute Rehab OT Goals Patient Stated Goal: to go home OT Goal Formulation: All assessment and education complete, DC therapy  OT Frequency:     Barriers to D/C:            Co-evaluation   Reason for Co-Treatment: For patient/therapist safety PT goals  addressed during session: Mobility/safety with mobility        End of Session Equipment Utilized During Treatment: Oxygen Nurse Communication: Mobility status;Other (comment) (O2 sats status.  )  Activity Tolerance: Patient tolerated treatment well;Other (comment) (O2 sats in high 80s on RA) Patient left: in chair;with call bell/phone within reach;with family/visitor present   Time: 1010-1030 OT Time Calculation (min): 20 min Charges:  OT General Charges $OT Visit: 1 Procedure OT Evaluation $OT Eval Moderate Complexity: 1 Procedure G-Codes:    Glenford Peers 24-May-2015, 10:44 AM [phone]

## 2015-05-05 NOTE — Evaluation (Signed)
Physical Therapy Evaluation Patient Details Name: [name] MRN: [id number] DOB: Jul 19, 1970 Today's Date: 05/05/2015   History of Present Illness  Pt is a 45 yo female admitted w h/o HVOS, OSA, DM2 adn bipolar d/o who now presents with SOB and has PNA and CHF.  Pt with O2 sats in 80s on arrival on RA.  Clinical Impression  Patient seen for mobility evaluation, independent with activity. O2 saturation assessment performed. Patient on room air at rest 91%, with activity dropped to 86%, on 2 liters 89%, on 3 liters 90% on 4 liters 94%. Cued for pursed lip breathing and educated on energy conservation. No further acute PT needs at this time. Will sign off.    Follow Up Recommendations No PT follow up    Equipment Recommendations  None recommended by PT    Recommendations for Other Services       Precautions / Restrictions Precautions Precautions: None Precaution Comments: monitor O2 saturations Restrictions Weight Bearing Restrictions: No      Mobility  Bed Mobility               General bed mobility comments: Pt in chair on arrival.  Transfers Overall transfer level: Modified independent Equipment used: None             General transfer comment: Pt safe with speed and hand position of basic transfers.  Ambulation/Gait Ambulation/Gait assistance: Modified independent (Device/Increase time) Ambulation Distance (Feet): 160 Feet Assistive device: None Gait Pattern/deviations: Step-through pattern;Decreased stride length;Drifts right/left Gait velocity: decreased   General Gait Details: steady with ambulation despite desaturations. patient ambulated initially on room air, desaturates to mid 80s, then on 2 liters saturations at 89%, on 3 liters 90% and 4 liters 94% with activity.  Stairs            Wheelchair Mobility    Modified Rankin (Stroke Patients Only)       Balance Overall balance assessment: No apparent balance deficits (not formally  assessed)                                           Pertinent Vitals/Pain Pain Assessment: No/denies pain    Home Living Family/patient expects to be discharged to:: Private residence Living Arrangements: Spouse/significant other Available Help at Discharge: Family;Available 24 hours/day Type of Home: House Home Access: Stairs to enter Entrance Stairs-Rails: Right Entrance Stairs-Number of Steps: 2 Home Layout: One level Home Equipment: None Additional Comments: has had home O2 4 yaers ago.    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     Hand Dominance   Dominant Hand: Right    Extremity/Trunk Assessment   Upper Extremity Assessment: Overall WFL for tasks assessed           Lower Extremity Assessment: Defer to PT evaluation      Cervical / Trunk Assessment: Other exceptions (has had cervical surgery in 2013)  Communication   Communication: No difficulties  Cognition Arousal/Alertness: Awake/alert Behavior During Therapy: WFL for tasks assessed/performed Overall Cognitive Status: Within Functional Limits for tasks assessed                      General Comments General comments (skin integrity, edema, etc.): Pt with obesity.    Exercises  Assessment/Plan    PT Assessment Patent does not need any further PT services  PT Diagnosis Difficulty walking   PT Problem List    PT Treatment Interventions     PT Goals (Current goals can be found in the Care Plan section) Acute Rehab PT Goals Patient Stated Goal: to go home PT Goal Formulation: All assessment and education complete, DC therapy    Frequency     Barriers to discharge        Co-evaluation PT/OT/SLP Co-Evaluation/Treatment: Yes Reason for Co-Treatment: For patient/therapist safety PT goals addressed during session: Mobility/safety with mobility         End of Session  Equipment Utilized During Treatment: Gait belt Activity Tolerance: Patient limited by fatigue Patient left: in chair;with call bell/phone within reach;with family/visitor present Nurse Communication: Mobility status         Time: 1010-1035 PT Time Calculation (min) (ACUTE ONLY): 25 min   Charges:   PT Evaluation $PT Eval Moderate Complexity: 1 Procedure     PT G CodesDuncan Dull 06/01/15, 11:18 AM Alben Deeds, PT DPT  [phone]

## 2015-05-05 NOTE — Progress Notes (Signed)
Pt has home cpap w [facility]. She would like to use [facility] for home oxygen. Orders faxed to [facility]. Poss dc 05-06-15

## 2015-05-06 ENCOUNTER — Inpatient Hospital Stay (HOSPITAL_COMMUNITY): Payer: 59

## 2015-05-06 DIAGNOSIS — I272 Other secondary pulmonary hypertension: Secondary | ICD-10-CM

## 2015-05-06 DIAGNOSIS — F411 Generalized anxiety disorder: Secondary | ICD-10-CM | POA: Diagnosis present

## 2015-05-06 DIAGNOSIS — J9622 Acute and chronic respiratory failure with hypercapnia: Secondary | ICD-10-CM

## 2015-05-06 DIAGNOSIS — G4733 Obstructive sleep apnea (adult) (pediatric): Secondary | ICD-10-CM | POA: Diagnosis present

## 2015-05-06 DIAGNOSIS — I5042 Chronic combined systolic (congestive) and diastolic (congestive) heart failure: Secondary | ICD-10-CM

## 2015-05-06 DIAGNOSIS — D45 Polycythemia vera: Secondary | ICD-10-CM | POA: Diagnosis present

## 2015-05-06 DIAGNOSIS — R0600 Dyspnea, unspecified: Secondary | ICD-10-CM

## 2015-05-06 DIAGNOSIS — I10 Essential (primary) hypertension: Secondary | ICD-10-CM | POA: Diagnosis present

## 2015-05-06 DIAGNOSIS — F32A Depression, unspecified: Secondary | ICD-10-CM | POA: Diagnosis present

## 2015-05-06 DIAGNOSIS — J9621 Acute and chronic respiratory failure with hypoxia: Secondary | ICD-10-CM | POA: Diagnosis present

## 2015-05-06 DIAGNOSIS — F329 Major depressive disorder, single episode, unspecified: Secondary | ICD-10-CM | POA: Diagnosis present

## 2015-05-06 DIAGNOSIS — F319 Bipolar disorder, unspecified: Secondary | ICD-10-CM | POA: Diagnosis present

## 2015-05-06 DIAGNOSIS — Z9989 Dependence on other enabling machines and devices: Secondary | ICD-10-CM

## 2015-05-06 LAB — LIPID PANEL
Cholesterol: 218 mg/dL — ABNORMAL HIGH (ref 0–200)
HDL: 55 mg/dL (ref >40)
LDL CALC: 132 mg/dL — AB (ref 0–99)
Total CHOL/HDL Ratio: 4 RATIO
Triglycerides: 153 mg/dL — ABNORMAL HIGH (ref <150)
VLDL: 31 mg/dL (ref 0–40)

## 2015-05-06 LAB — COMPREHENSIVE METABOLIC PANEL
ALT: 18 U/L (ref 14–54)
AST: 52 U/L — AB (ref 15–41)
Albumin: 2.6 g/dL — ABNORMAL LOW (ref 3.5–5.0)
Alkaline Phosphatase: 70 U/L (ref 38–126)
Anion gap: 13 (ref 5–15)
BUN: 16 mg/dL (ref 6–20)
CHLORIDE: 100 mmol/L — AB (ref 101–111)
CO2: 27 mmol/L (ref 22–32)
Calcium: 8 mg/dL — ABNORMAL LOW (ref 8.9–10.3)
Creatinine, Ser: 0.82 mg/dL (ref 0.44–1.00)
Glucose, Bld: 114 mg/dL — ABNORMAL HIGH (ref 65–99)
POTASSIUM: 6 mmol/L — AB (ref 3.5–5.1)
SODIUM: 140 mmol/L (ref 135–145)
Total Bilirubin: 1.8 mg/dL — ABNORMAL HIGH (ref 0.3–1.2)
Total Protein: 5.2 g/dL — ABNORMAL LOW (ref 6.5–8.1)

## 2015-05-06 LAB — ECHOCARDIOGRAM COMPLETE
HEIGHTINCHES: 65 in
WEIGHTICAEL: 6043.2 [oz_av]

## 2015-05-06 LAB — CBC WITH DIFFERENTIAL/PLATELET
BASOS ABS: 0 10*3/uL (ref 0.0–0.1)
BASOS PCT: 0 %
EOS ABS: 0.1 10*3/uL (ref 0.0–0.7)
Eosinophils Relative: 2 %
HCT: 55.1 % — ABNORMAL HIGH (ref 36.0–46.0)
Hemoglobin: 17.6 g/dL — ABNORMAL HIGH (ref 12.0–15.0)
LYMPHS ABS: 2.4 10*3/uL (ref 0.7–4.0)
Lymphocytes Relative: 36 %
MCH: 32.1 pg (ref 26.0–34.0)
MCHC: 31.9 g/dL (ref 30.0–36.0)
MCV: 100.5 fL — ABNORMAL HIGH (ref 78.0–100.0)
Monocytes Absolute: 0.5 10*3/uL (ref 0.1–1.0)
Monocytes Relative: 7 %
NEUTROS ABS: 3.7 10*3/uL (ref 1.7–7.7)
Neutrophils Relative %: 55 %
Platelets: 190 10*3/uL (ref 150–400)
RBC: 5.48 MIL/uL — ABNORMAL HIGH (ref 3.87–5.11)
RDW: 15.7 % — AB (ref 11.5–15.5)
WBC: 6.7 10*3/uL (ref 4.0–10.5)

## 2015-05-06 LAB — MAGNESIUM: MAGNESIUM: 2.2 mg/dL (ref 1.7–2.4)

## 2015-05-06 LAB — CULTURE, BLOOD (ROUTINE X 2)

## 2015-05-06 LAB — POTASSIUM: POTASSIUM: 5 mmol/L (ref 3.5–5.1)

## 2015-05-06 MED ORDER — ATORVASTATIN CALCIUM 20 MG PO TABS: 20.0000 mg | ORAL_TABLET | Freq: Every day | ORAL | Status: DC

## 2015-05-06 MED ORDER — CEFUROXIME AXETIL 500 MG PO TABS: 500.0000 mg | ORAL_TABLET | Freq: Two times a day (BID) | ORAL | Status: DC

## 2015-05-06 MED ORDER — AZITHROMYCIN 500 MG PO TABS: 500.0000 mg | ORAL_TABLET | ORAL | Status: DC

## 2015-05-06 MED ORDER — CLONAZEPAM 1 MG PO TABS: 1.0000 mg | ORAL_TABLET | Freq: Three times a day (TID) | ORAL | Status: DC

## 2015-05-06 NOTE — Progress Notes (Signed)
Spoke w [facility] and they are working on pt's o2. Husband states they have already heard from [facility] about port del to room. [facility] will del concent and other port when pt gets home.

## 2015-05-06 NOTE — Significant Event (Signed)
Discharge education and AVS discussed with patient and husband, discussed with patient calling to get return appointment set up within a week with primary care provider and Dr Barbie Banner.  All patient belongings and O2 tank left with patient.  IV removed with end intact, no bleeding or redness noted at site.  O2 set to 4liters via [place] on own home O2.  Patient and husband voiced understanding, no questions at this time.

## 2015-05-06 NOTE — Progress Notes (Signed)
  Echocardiogram 2D Echocardiogram has been performed.  [name] 05/06/2015, 8:51 AM

## 2015-05-07 ENCOUNTER — Other Ambulatory Visit: Payer: Self-pay | Admitting: Family Medicine

## 2015-05-09 LAB — CULTURE, BLOOD (ROUTINE X 2): CULTURE: NO GROWTH

## 2015-05-10 ENCOUNTER — Ambulatory Visit: Payer: PRIVATE HEALTH INSURANCE | Admitting: Psychology

## 2015-05-17 ENCOUNTER — Ambulatory Visit (INDEPENDENT_AMBULATORY_CARE_PROVIDER_SITE_OTHER): Payer: PRIVATE HEALTH INSURANCE | Admitting: Psychology

## 2015-05-17 DIAGNOSIS — F319 Bipolar disorder, unspecified: Secondary | ICD-10-CM

## 2015-05-19 ENCOUNTER — Encounter: Payer: Self-pay | Admitting: Family Medicine

## 2015-05-19 ENCOUNTER — Ambulatory Visit (INDEPENDENT_AMBULATORY_CARE_PROVIDER_SITE_OTHER): Payer: 59 | Admitting: Family Medicine

## 2015-05-19 VITALS — BP 124/86 | HR 87 | Temp 98.4°F | Ht 65.0 in

## 2015-05-19 DIAGNOSIS — J9621 Acute and chronic respiratory failure with hypoxia: Secondary | ICD-10-CM | POA: Diagnosis not present

## 2015-05-19 DIAGNOSIS — R7989 Other specified abnormal findings of blood chemistry: Secondary | ICD-10-CM

## 2015-05-19 DIAGNOSIS — I272 Other secondary pulmonary hypertension: Secondary | ICD-10-CM

## 2015-05-19 DIAGNOSIS — E662 Morbid (severe) obesity with alveolar hypoventilation: Secondary | ICD-10-CM

## 2015-05-19 DIAGNOSIS — I10 Essential (primary) hypertension: Secondary | ICD-10-CM

## 2015-05-19 DIAGNOSIS — J189 Pneumonia, unspecified organism: Secondary | ICD-10-CM

## 2015-05-19 DIAGNOSIS — E118 Type 2 diabetes mellitus with unspecified complications: Secondary | ICD-10-CM | POA: Diagnosis not present

## 2015-05-19 LAB — POCT GLYCOSYLATED HEMOGLOBIN (HGB A1C): HEMOGLOBIN A1C: 6.5

## 2015-05-19 NOTE — Progress Notes (Signed)
Pre visit review using our clinic review tool, if applicable. No additional management support is needed unless otherwise documented below in the visit note. 

## 2015-05-19 NOTE — Patient Instructions (Signed)
Before you leave: -Hemoglobin A1c -Schedule morning follow-up in about 3 months, we'll plan to do labs that day  See her pulmonologist as scheduled  We recommend the following healthy lifestyle measures: - eat a healthy whole foods diet consisting of regular small meals composed of vegetables, fruits, beans, nuts, seeds, healthy meats such as white chicken and fish and whole grains.  - avoid sweets, white starchy foods, fried foods, fast food, processed foods, sodas, red meet and other fattening foods.  - get as much activity as tolerated and gradually work up to regular exercise

## 2015-05-19 NOTE — Progress Notes (Signed)
HPI:  [name] is a pleasant 45 yo with and extensive PMH significant for morbid obesity, obesity hypoventilation syndrome, possible asthma, HTN, HLD, seasonal allergies, OSA, cardiomyopathy, anxiety, prediabetes, bipolar disorder, cor pulmonale, hx CHF followed by multiple specialists including pulmonology, cardiology and psychiatry. She is here for follow-up after recent hospitalization for hypoxic respiratory failure. -Hospitalized 4/4 to 05/06/2015  -Her discharge notes, treated with antibiotics for RUL CAP (azithro, rocephin, vanc, Ceftin), reports she also had steroids in the hospital -Discharged on home O2 with pulmonology follow-up -She continues her CPAP  -She had an echo in the hospital - grade 2 diastolic dysfunction -She reports she has done quite well since coming home, is wearing her oxygen and has pulmonary follow-up tomorrow -She had some complaints regarding the care she received in the hospital and being there made her very anxious -She reports a nonfasting cholesterol panel was checked and she was started on a statin; she does not understand why statin was started given she was not fasting and wants to check a fasting cholesterol panel -She reports she sees her cardiologist and he has always told her her cholesterol is fine -Denies the fevers, thick mucus production, white mucus, shortness of breath, frequent wheezing or cough -Has occasional wheeze and takes albuterol for this -Note: She presented here prior to her hospitalization and refused direct transport to  the hospital despite hypoxic respiratory failure and left AMA.  ROS: See pertinent positives and negatives per HPI.  Past Medical History  Diagnosis Date  . Morbid obesity ([place])   . Obesity hypoventilation syndrome ([place])   . Bipolar disorder ([place])   . Hypoxemia     history of - no home O2  . Cor pulmonale ([place])   . History of pleural effusion   . Spinal stenosis in cervical region   . Acute systolic  congestive heart failure ([place])   . Seasonal allergies     current runny nose  . Arthritis     osteoarthritis bilal. knees  . Urinary incontinence   . Abrasion of breast 06/21/2011    left  . Sleep apnea sleep study 07/02/2010    uses CPAP nightly  . Asthma     no per PFT 7/13; reports does not have asthma  . Seizures ([place])     febrile seizure x 1 as a child. Several times  . IBS (irritable bowel syndrome)   . Carpal tunnel syndrome of left wrist 05/2011    Being evaluated for MS  . Prediabetes   . Agoraphobia   . Hyperlipidemia   . Hypertension   . Depression   . Anxiety     panic attacks  . Allergy     Past Surgical History  Procedure Laterality Date  . Wisdom tooth extraction    . Mass excision  08/07/2010    right index  . Carpal tunnel release  06/27/2011    Procedure: CARPAL TUNNEL RELEASE;  Surgeon: Wynonia Sours, MD;  Location: [facility];  Service: Orthopedics;  Laterality: Left;  . Posterior cervical fusion/foraminotomy  09/11/2011    Procedure: POSTERIOR CERVICAL FUSION/FORAMINOTOMY LEVEL 1;  Surgeon: Erline Levine, MD;  Location: [facility];  Service: Neurosurgery;  Laterality: N/A;  Cervical Three-Four Posteior Cervical Fusion and Decompression.  . Carpal tunnel release  12/19/2011    Procedure: CARPAL TUNNEL RELEASE;  Surgeon: Wynonia Sours, MD;  Location: [facility];  Service: Orthopedics;  Laterality: Right;  . Trigger finger release  12/19/2011  Procedure: RELEASE TRIGGER FINGER/A-1 PULLEY;  Surgeon: Wynonia Sours, MD;  Location: [facility];  Service: Orthopedics;  Laterality: Right;  . Trigger finger release Left 12/31/2012    Procedure: RELEASE A-1 PULLEY LEFT THUMB;  Surgeon: Wynonia Sours, MD;  Location: [facility];  Service: Orthopedics;  Laterality: Left;    Family History  Problem Relation Age of Onset  . Asthma Mother   . Allergic rhinitis Mother   . Multiple sclerosis Mother   . Asthma Maternal  Grandmother     Social History   Social History  . Marital Status: Married    Spouse Name: N/A  . Number of Children: N/A  . Years of Education: N/A   Occupational History  . currently unemployed    Social History Main Topics  . Smoking status: Never Smoker   . Smokeless tobacco: Never Used  . Alcohol Use: Yes     Comment: rare  . Drug Use: No  . Sexual Activity: Not Asked   Other Topics Concern  . None   Social History Narrative   Lives in [place]           Current outpatient prescriptions:  .  acetaminophen (TYLENOL) 500 MG tablet, Take 2,000 mg by mouth 2 (two) times daily. Taking every day per patient, Disp: , Rfl:  .  albuterol (PROVENTIL HFA;VENTOLIN HFA) 108 (90 Base) MCG/ACT inhaler, Inhale 2 puffs into the lungs every 6 (six) hours as needed for wheezing or shortness of breath., Disp: 1 Inhaler, Rfl: 3 .  ALPRAZolam (XANAX) 1 MG tablet, Take 1 mg by mouth 3 (three) times daily. , Disp: , Rfl:  .  Ascorbic Acid (VITAMIN C) 500 MG tablet, Take 1,000 mg by mouth daily. , Disp: , Rfl:  .  aspirin 81 MG tablet, Take 81 mg by mouth daily. , Disp: , Rfl:  .  atorvastatin (LIPITOR) 20 MG tablet, Take 1 tablet (20 mg total) by mouth daily at 6 PM., Disp: 30 tablet, Rfl: 0 .  b complex vitamins tablet, Take 1 tablet by mouth daily., Disp: , Rfl:  .  carbamazepine (TEGRETOL) 200 MG tablet, Take 200 mg by mouth at bedtime. Take 1 tablet in the morning and 2 tablets at night, Disp: , Rfl:  .  Cholecalciferol (VITAMIN D3) 5000 UNITS TABS, Take 3 tablets by mouth daily. , Disp: , Rfl:  .  Chromium Picolinate 500 MCG CAPS, Take 1 capsule by mouth daily., Disp: , Rfl:  .  clonazePAM (KLONOPIN) 1 MG tablet, Take 1 tablet (1 mg total) by mouth 3 (three) times daily., Disp: 30 tablet, Rfl: 0 .  furosemide (LASIX) 40 MG tablet, Take 2 tablets (80 mg total) by mouth 2 (two) times daily., Disp: 360 tablet, Rfl: 3 .  KLOR-CON M20 20 MEQ tablet, TAKE 1 TABLET DAILY, Disp: 90 tablet,  Rfl: 3 .  lamoTRIgine (LAMICTAL) 200 MG tablet, Take 200 mg by mouth 2 (two) times daily. , Disp: , Rfl:  .  losartan (COZAAR) 25 MG tablet, Take 1 tablet (25 mg total) by mouth 2 (two) times daily., Disp: 180 tablet, Rfl: 3 .  Lurasidone HCl (LATUDA) 120 MG TABS, Take 1 tablet by mouth daily., Disp: , Rfl:  .  LYRICA 300 MG capsule, Take 1 tablet by mouth Twice daily., Disp: , Rfl:  .  MAGNESIUM PO, Take 1 tablet by mouth daily., Disp: , Rfl:  .  Meloxicam (MOBIC PO), Take 7.5 mg by mouth every 12 (twelve)  hours. , Disp: , Rfl:  .  metFORMIN (GLUCOPHAGE) 500 MG tablet, TAKE 1 TABLET BY MOUTH TWICE DAILY WITH MEALS, Disp: 180 tablet, Rfl: 1 .  methylphenidate (RITALIN) 10 MG tablet, 5-20 mg. Takes 20 mg morning and noon, and 5-10 mg at 4pm, Disp: , Rfl:  .  Multiple Vitamin (MULTIVITAMIN) tablet, Take 1 tablet by mouth daily.  , Disp: , Rfl:  .  norethindrone-ethinyl estradiol-iron (MICROGESTIN FE 1.5/30) 1.5-30 MG-MCG tablet, Take 1 tablet by mouth at bedtime. Takes continuously, Disp: , Rfl:  .  Zinc 50 MG CAPS, Take 50 mg by mouth daily., Disp: , Rfl:   EXAM:  Filed Vitals:   05/19/15 1521  BP: 124/86  Pulse: 87  Temp: 98.4 F (36.9 C)  Refused to get a weight today  There is no weight on file to calculate BMI.  GENERAL: vitals reviewed and listed above, alert, oriented, appears well hydrated and in no acute distress  HEENT: atraumatic, conjunttiva clear, no obvious abnormalities on inspection of external nose and ears  NECK: no obvious masses on inspection  LUNGS: clear to auscultation bilaterally, no wheezes, rales or rhonchi, good air movement, few scattered wheezes, prolonged expiration wearing oxygen,  no signs of respiratory distress   CV: HRRR, no peripheral edema  MS: moves all extremities without noticeable abnormality  PSYCH: pleasant and cooperative, no obvious depression or anxiety  ASSESSMENT AND PLAN:  Discussed the following assessment and plan:  Type 2  diabetes mellitus with complication, without long-term current use of insulin (HCC) - Plan: POCT HgB A1C, Lipid panel, Hemoglobin A1c  Abnormal CBC - Plan: CBC (no diff)  Obesity hypoventilation syndrome (HCC)  Acute on chronic respiratory failure with hypoxia (HCC)  CAP (community acquired pneumonia)  Pulmonary hypertension ([place])  Essential hypertension  -Follow up with pulmonology tomorrow  -POC Hgba1c today - higher than in the past, likely related to recent steroids and hospitalization and illness - lifestyle recommendations advised, follow-up in 3 months -advised to schedule diabetic eye exam -She is actually fasting today as she really wants to check a fasting cholesterol panel, will check today along with a CBC -Advised if LDL over 100 to go ahead and start statin, she was in agreement; otherwise she has opted to discuss with her cardiologist -Advised that if she is having respiratory or cardiac issues in the future she must see her specialists for advice given her recent failure to follow medical advice and leaving AMA - she agrees and understands she will be discharged from our practice if this occurs again  -Patient advised to return or notify a doctor immediately if symptoms worsen or persist or new concerns arise.  Patient Instructions  Before you leave: -Hemoglobin A1c -Schedule morning follow-up in about 3 months, we'll plan to do labs that day  See her pulmonologist as scheduled  We recommend the following healthy lifestyle measures: - eat a healthy whole foods diet consisting of regular small meals composed of vegetables, fruits, beans, nuts, seeds, healthy meats such as white chicken and fish and whole grains.  - avoid sweets, white starchy foods, fried foods, fast food, processed foods, sodas, red meet and other fattening foods.  - get as much activity as tolerated and gradually work up to regular exercise       Ariannah Arenson, [employer] R.

## 2015-05-20 ENCOUNTER — Encounter: Payer: Self-pay | Admitting: Acute Care

## 2015-05-20 ENCOUNTER — Ambulatory Visit (INDEPENDENT_AMBULATORY_CARE_PROVIDER_SITE_OTHER): Payer: 59 | Admitting: Acute Care

## 2015-05-20 VITALS — BP 140/68 | HR 96 | Ht 65.0 in | Wt 365.0 lb

## 2015-05-20 DIAGNOSIS — D45 Polycythemia vera: Secondary | ICD-10-CM | POA: Diagnosis not present

## 2015-05-20 DIAGNOSIS — J189 Pneumonia, unspecified organism: Secondary | ICD-10-CM | POA: Diagnosis not present

## 2015-05-20 DIAGNOSIS — J4520 Mild intermittent asthma, uncomplicated: Secondary | ICD-10-CM

## 2015-05-20 DIAGNOSIS — G4733 Obstructive sleep apnea (adult) (pediatric): Secondary | ICD-10-CM | POA: Diagnosis not present

## 2015-05-20 LAB — LIPID PANEL
CHOLESTEROL: 271 mg/dL — AB (ref 0–200)
HDL: 75.4 mg/dL (ref >39.00)
LDL Cholesterol: 162 mg/dL — ABNORMAL HIGH (ref 0–99)
NonHDL: 195.5
Total CHOL/HDL Ratio: 4
Triglycerides: 166 mg/dL — ABNORMAL HIGH (ref 0.0–149.0)
VLDL: 33.2 mg/dL (ref 0.0–40.0)

## 2015-05-20 LAB — CBC
HEMATOCRIT: 51 % — AB (ref 36.0–46.0)
HEMOGLOBIN: 16.7 g/dL — AB (ref 12.0–15.0)
MCHC: 32.7 g/dL (ref 30.0–36.0)
MCV: 96.4 fl (ref 78.0–100.0)
Platelets: 209 10*3/uL (ref 150.0–400.0)
RBC: 5.3 Mil/uL — ABNORMAL HIGH (ref 3.87–5.11)
RDW: 15.9 % — AB (ref 11.5–15.5)
WBC: 8.2 10*3/uL (ref 4.0–10.5)

## 2015-05-20 LAB — HEMOGLOBIN A1C: Hgb A1c MFr Bld: 6.9 % — ABNORMAL HIGH (ref 4.6–6.5)

## 2015-05-20 NOTE — Progress Notes (Signed)
Subjective:    Patient ID: [name], female    DOB: September 10, 1970, 45 y.o.   MRN: [id number]  HPI 45 y.o. WF PMHx Bipolar Disorder, Agoraphobia,Depression, Anxiety (panic attacks),, OSA, OHS, Asthma , Cor Pulmonale, HTN, Chronic Systolic/ Diastolic CHF, and DM Type 2 .  Significant Events/Procedures:  Recent Hospitalization:  Admit date: 05/03/2015 Discharge date: 05/06/2015 Discharge Diagnoses:  Principal Problem:  Acute on chronic respiratory failure ([place]) Active Problems:  Obesity hypoventilation syndrome (HCC)  Chronic systolic heart failure   CAP (community acquired pneumonia)  DM2 (diabetes mellitus, type 2) ([place])  Acute on chronic respiratory failure with hypoxia and hypercapnia (HCC)  OSA on CPAP  Essential hypertension  Polycythemia vera (HCC)  Bipolar I disorder (HCC)  Depression  Anxiety state  Chronic combined systolic and diastolic CHF (congestive heart failure) ([place])  Pulmonary hypertension ([place])  Procedures:  4/4 PCXR;Cardiomegaly. Mild infrahilar bronchitic changes 4/4 CT angiogram PE protocol;Negative PE - Patchy RUL opacity could represent pneumonia but is not conclusively characterized.- Nonspecific nodes Rt hilum and mediastinum could be reactive. -Opacities in the RML and in the bases,  unchanged and more likely represent scarring.  4/5 blood right forearm positive coag negative staph 1/2 (contaminant) 4/5 blood right hand NGTD 4/5 MRSA by PCR negative   Antibiotics  Azithro 4/4 >> Rocephin 4/4 > 4/6 Vancomycin 4/5>> 4/7 Ceftin 4/6>>  05/20/2015: Follow up hospital visit: Pt. Presents to the office for hospital follow up of Acute on chronic respiratory failure 2/2 CAP and OHS/OSA.[name] She states she is doing well. Has no complaints. She completed the course of oral antibiotics she was discharged on.She denies fever, chest pain, cough, increase in sputum, orthopnea,hemoptysis, leg or calf pain.She states she has lost 8 pounds  since her admission, which she attributes to fluid.She continues to wear her CPAP at night with oxygen. She is wearing 2L [place] continuously per discharge instructions. We will walk her to see if she desaturates with exertion. She is anxious to wean the oxygen.I have told her she needs to wear it at night, but can titrate her oxygen use for an oxygen saturation of >90% during the day. She has polycythemia, which is attributed to not using her home oxygen. Her PCP did labs yesterday, and told the patient that it may take up to 3 months to see if this will correct. This will need to be followed and worked up if no improvement on recheck. Will encourage patient to wear her oxygen each night, bled into her CPAP machine.   Current outpatient prescriptions:  .  acetaminophen (TYLENOL) 500 MG tablet, Take 2,000 mg by mouth 2 (two) times daily. Taking every day per patient, Disp: , Rfl:  .  albuterol (PROVENTIL HFA;VENTOLIN HFA) 108 (90 Base) MCG/ACT inhaler, Inhale 2 puffs into the lungs every 6 (six) hours as needed for wheezing or shortness of breath., Disp: 1 Inhaler, Rfl: 3 .  ALPRAZolam (XANAX) 1 MG tablet, Take 1 mg by mouth 3 (three) times daily. , Disp: , Rfl:  .  Ascorbic Acid (VITAMIN C) 500 MG tablet, Take 1,000 mg by mouth daily. , Disp: , Rfl:  .  aspirin 81 MG tablet, Take 81 mg by mouth daily. , Disp: , Rfl:  .  b complex vitamins tablet, Take 1 tablet by mouth daily., Disp: , Rfl:  .  carbamazepine (TEGRETOL) 200 MG tablet, Take 200 mg by mouth at bedtime. Take 1 tablet in the morning and 2 tablets at night, Disp: , Rfl:  .  Cholecalciferol (VITAMIN D3) 5000 UNITS TABS, Take 3 tablets by mouth daily. , Disp: , Rfl:  .  Chromium Picolinate 500 MCG CAPS, Take 1 capsule by mouth daily., Disp: , Rfl:  .  clonazePAM (KLONOPIN) 1 MG tablet, Take 1 tablet (1 mg total) by mouth 3 (three) times daily., Disp: 30 tablet, Rfl: 0 .  furosemide (LASIX) 40 MG tablet, Take 2 tablets (80 mg total) by mouth 2  (two) times daily., Disp: 360 tablet, Rfl: 3 .  KLOR-CON M20 20 MEQ tablet, TAKE 1 TABLET DAILY, Disp: 90 tablet, Rfl: 3 .  lamoTRIgine (LAMICTAL) 200 MG tablet, Take 200 mg by mouth 2 (two) times daily. , Disp: , Rfl:  .  losartan (COZAAR) 25 MG tablet, Take 1 tablet (25 mg total) by mouth 2 (two) times daily., Disp: 180 tablet, Rfl: 3 .  Lurasidone HCl (LATUDA) 120 MG TABS, Take 1 tablet by mouth daily., Disp: , Rfl:  .  LYRICA 300 MG capsule, Take 1 tablet by mouth Twice daily., Disp: , Rfl:  .  MAGNESIUM PO, Take 1 tablet by mouth daily., Disp: , Rfl:  .  Meloxicam (MOBIC PO), Take 7.5 mg by mouth every 12 (twelve) hours. , Disp: , Rfl:  .  metFORMIN (GLUCOPHAGE) 500 MG tablet, TAKE 1 TABLET BY MOUTH TWICE DAILY WITH MEALS, Disp: 180 tablet, Rfl: 1 .  methylphenidate (RITALIN) 10 MG tablet, 5-20 mg. Takes 20 mg morning and noon, and 5-10 mg at 4pm, Disp: , Rfl:  .  Multiple Vitamin (MULTIVITAMIN) tablet, Take 1 tablet by mouth daily.  , Disp: , Rfl:  .  norethindrone-ethinyl estradiol-iron (MICROGESTIN FE 1.5/30) 1.5-30 MG-MCG tablet, Take 1 tablet by mouth at bedtime. Takes continuously, Disp: , Rfl:  .  Zinc 50 MG CAPS, Take 50 mg by mouth daily., Disp: , Rfl:  .  atorvastatin (LIPITOR) 20 MG tablet, Take 1 tablet (20 mg total) by mouth daily at 6 PM. (Patient not taking: Reported on 05/20/2015), Disp: 30 tablet, Rfl: 0   . Past Medical History  Diagnosis Date  . Morbid obesity ([place])   . Obesity hypoventilation syndrome ([place])   . Bipolar disorder ([place])   . Hypoxemia     history of - no home O2  . Cor pulmonale ([place])   . History of pleural effusion   . Spinal stenosis in cervical region   . Acute systolic congestive heart failure ([place])   . Seasonal allergies     current runny nose  . Arthritis     osteoarthritis bilal. knees  . Urinary incontinence   . Abrasion of breast 06/21/2011    left  . Sleep apnea sleep study 07/02/2010    uses CPAP nightly  . Asthma     no per PFT 7/13;  reports does not have asthma  . Seizures ([place])     febrile seizure x 1 as a child. Several times  . IBS (irritable bowel syndrome)   . Carpal tunnel syndrome of left wrist 05/2011    Being evaluated for MS  . Prediabetes   . Agoraphobia   . Hyperlipidemia   . Hypertension   . Depression   . Anxiety     panic attacks  . Allergy     Allergies  Allergen Reactions  . Savella [Milnacipran Hcl]     mania     Review of Systems Constitutional:   No  weight loss, night sweats,  Fevers, chills, fatigue, or  lassitude.  HEENT:   No headaches,  Difficulty swallowing,  Tooth/dental problems, or  Sore throat,                No sneezing, itching, ear ache, nasal congestion, post nasal drip,   CV:  No chest pain,  Orthopnea, PND, swelling in lower extremities, anasarca, dizziness, palpitations, syncope.   GI  No heartburn, indigestion, abdominal pain, nausea, vomiting, diarrhea, change in bowel habits, loss of appetite, bloody stools.   Resp: No shortness of breath with exertion or at rest.  No excess mucus, no productive cough,  No non-productive cough,  No coughing up of blood.  No change in color of mucus.  No wheezing.  No chest wall deformity  Skin: no rash or lesions.  GU: no dysuria, change in color of urine, no urgency or frequency.  No flank pain, no hematuria   MS:  No joint pain or swelling.  No decreased range of motion.  No back pain.  Psych:  No change in mood or affect. No depression or anxiety.  No memory loss.        Objective:   Physical Exam  BP 140/68 mmHg  Pulse 96  Ht 5\' 5"  (1.651 m)  Wt 365 lb (165.563 kg)  BMI 60.74 kg/m2  SpO2 92%  Physical Exam:  General- No distress,  A&Ox3, obese ENT: No sinus tenderness, TM clear, pale nasal mucosa, no oral exudate,no post nasal drip, no LAN Cardiac: S1, S2, regular rate and rhythm, no murmur Chest: No wheeze/ rales/ dullness; no accessory muscle use, no nasal flaring, no sternal retractions Abd.: Soft  Non-tender Ext: No clubbing cyanosis, trace edema Neuro:  normal strength Skin: No rashes, warm and dry Psych: normal mood and behavior   Magdalen Spatz, AGACNP-BC [facility] 05/20/2015    Assessment & Plan:

## 2015-05-20 NOTE — Assessment & Plan Note (Addendum)
OSA States she is compliant with CPAP Plan: Overnight oxygen study with CPAP and oxygen Continue on CPAP at bedtime with oxygen  You appear to be benefiting from the treatment Goal is to wear for at least 4-6 hours each night for maximal clinical benefit. Continue to work on weight loss, as the link between excess weight  and sleep apnea is well established.  Do not drive if sleepy. Follow up with Dr.Alva as needed.  Please contact office for sooner follow up if symptoms do not improve or worsen or seek emergency care

## 2015-05-20 NOTE — Assessment & Plan Note (Signed)
PCP follow up labs done 05/19/2015 Plan: Follow along with PCP for resolution If no resolution in 3-4 months hematology consult Patient to wear oxygen every night Patient to wear oxygen to maintain Sat of >90% during the day Night time oxygen study on CPAP and oxygen

## 2015-05-20 NOTE — Patient Instructions (Addendum)
It is nice to meet you today I am glad you are so much better. We will schedule you for an overnight oximetry Continue wearing your CPAP every night with oxygen. We will walk you today to monitor your saturations off oxygen with exertion Wean your oxygen while at rest for an oxygen saturation of 90% or greater.  Work on weaning your oxygen first at rest and then at exertion. If your oxygen saturation drops below 90% you need to wear your oxygen. Follow up in 4 weeks with either Dr. Elsworth Soho or myself  to make sure you are continuing to do well Please contact office for sooner follow up if symptoms do not improve or worsen or seek emergency care

## 2015-05-23 ENCOUNTER — Encounter: Payer: Self-pay | Admitting: *Deleted

## 2015-05-23 NOTE — Progress Notes (Signed)
Reviewed & agree with plan Check ONO to look for persistent hypoxia as cause of her polycythemia

## 2015-05-24 ENCOUNTER — Ambulatory Visit (INDEPENDENT_AMBULATORY_CARE_PROVIDER_SITE_OTHER): Payer: PRIVATE HEALTH INSURANCE | Admitting: Psychology

## 2015-05-24 ENCOUNTER — Ambulatory Visit: Payer: PRIVATE HEALTH INSURANCE | Admitting: Psychology

## 2015-05-24 DIAGNOSIS — F3181 Bipolar II disorder: Secondary | ICD-10-CM | POA: Diagnosis not present

## 2015-05-31 ENCOUNTER — Ambulatory Visit (INDEPENDENT_AMBULATORY_CARE_PROVIDER_SITE_OTHER): Payer: PRIVATE HEALTH INSURANCE | Admitting: Psychology

## 2015-05-31 ENCOUNTER — Other Ambulatory Visit: Payer: Self-pay | Admitting: *Deleted

## 2015-05-31 DIAGNOSIS — F3181 Bipolar II disorder: Secondary | ICD-10-CM

## 2015-05-31 MED ORDER — LOSARTAN POTASSIUM 25 MG PO TABS: 25.0000 mg | ORAL_TABLET | Freq: Two times a day (BID) | ORAL | Status: DC

## 2015-06-03 ENCOUNTER — Other Ambulatory Visit: Payer: Self-pay | Admitting: *Deleted

## 2015-06-07 ENCOUNTER — Ambulatory Visit (INDEPENDENT_AMBULATORY_CARE_PROVIDER_SITE_OTHER): Payer: PRIVATE HEALTH INSURANCE | Admitting: Psychology

## 2015-06-07 DIAGNOSIS — F3181 Bipolar II disorder: Secondary | ICD-10-CM

## 2015-06-13 ENCOUNTER — Other Ambulatory Visit: Payer: Self-pay | Admitting: *Deleted

## 2015-06-13 MED ORDER — POTASSIUM CHLORIDE CRYS ER 20 MEQ PO TBCR
20.0000 meq | EXTENDED_RELEASE_TABLET | Freq: Every day | ORAL | Status: DC
Start: 2015-06-13 — End: 2015-06-20

## 2015-06-14 ENCOUNTER — Ambulatory Visit (INDEPENDENT_AMBULATORY_CARE_PROVIDER_SITE_OTHER): Payer: PRIVATE HEALTH INSURANCE | Admitting: Psychology

## 2015-06-14 DIAGNOSIS — F3181 Bipolar II disorder: Secondary | ICD-10-CM

## 2015-06-20 ENCOUNTER — Telehealth: Payer: Self-pay | Admitting: Cardiology

## 2015-06-20 MED ORDER — POTASSIUM CHLORIDE CRYS ER 20 MEQ PO TBCR: 20.0000 meq | EXTENDED_RELEASE_TABLET | Freq: Every day | ORAL | Status: DC

## 2015-06-20 MED ORDER — LOSARTAN POTASSIUM 25 MG PO TABS: 25.0000 mg | ORAL_TABLET | Freq: Two times a day (BID) | ORAL | Status: DC

## 2015-06-20 NOTE — Telephone Encounter (Signed)
New Message   *STAT* If patient is at the pharmacy, call can be transferred to refill team.   1. Which medications need to be refilled? (please list name of each medication and dose if known) potassium chloride SA (KLOR-CON M20) 20 MEQ tablet  2. Which pharmacy/location (including street and city if local pharmacy) is medication to be sent to? [facility] on [address]  3. Do they need a 30 day or 90 day supply? 90 would be best

## 2015-06-20 NOTE — Telephone Encounter (Signed)
E-SENT TO PHARMACY PATIENT AWARE 

## 2015-06-21 ENCOUNTER — Ambulatory Visit (INDEPENDENT_AMBULATORY_CARE_PROVIDER_SITE_OTHER): Payer: PRIVATE HEALTH INSURANCE | Admitting: Psychology

## 2015-06-21 DIAGNOSIS — F3181 Bipolar II disorder: Secondary | ICD-10-CM | POA: Diagnosis not present

## 2015-06-23 ENCOUNTER — Ambulatory Visit (INDEPENDENT_AMBULATORY_CARE_PROVIDER_SITE_OTHER): Payer: 59 | Admitting: Adult Health

## 2015-06-23 ENCOUNTER — Encounter: Payer: Self-pay | Admitting: Adult Health

## 2015-06-23 VITALS — BP 118/62 | HR 92 | Temp 98.7°F | Ht 65.0 in | Wt 369.0 lb

## 2015-06-23 DIAGNOSIS — G4733 Obstructive sleep apnea (adult) (pediatric): Secondary | ICD-10-CM | POA: Diagnosis not present

## 2015-06-23 NOTE — Assessment & Plan Note (Signed)
Desats on CPAP with oxygen 3l/m - she needs to change to full face mask which she will get before new study Get CPAP download.  Discussed CPAP titration , however she would like to repeat ONO with new mask and feels she is much stronger.   Plan  Repeat ONO on CPAP and Oxygen .  Continue on CPAP At bedtime   Work on weight loss.  CPAP download requested.  Follow up with Dr. Elsworth Soho  In 3-4 months and As needed   Discontinue portable oxygen .

## 2015-06-23 NOTE — Progress Notes (Signed)
Reviewed & agree with plan  

## 2015-06-23 NOTE — Progress Notes (Signed)
Subjective:    Patient ID: [name], female    DOB: 12-Apr-1970, 45 y.o.   MRN: [id number]  HPI 32 WF never smoker with morbid obesity for FU of obstructive sleep apnea on CPAP 13 cm, obesity hypoventilation & 'asthma'  She also has DM-2 & bipolar -Sees Dr Toy Care every 8 wks & Cheryln Manly every week   Prior dx with asthma has been tx in past with advair , theophylline.  Previously seen by allergist, recommended to have allergy vaccines . Declined.  allegra and nasonex added 4/13 for nasal symptoms but c/o increased asthma attacks after addition incl acute OV in 6/13 - changed regimen to Zyrtec +Chlortrimeton 6/13  UDS in chart review +marijuana 2012.  Significant tests/ events  Admitted 04/2010 for worsening dyspnea & wt gain .  ABG was 7.40/49/63 on 4 L [place]. Echo showed EF 50% with mild decrease in systolic function. BNP > 500 & CXR showed a rt pleural effusion. CT angio neg for PE, She was diuresed > 50 lbs, discharged on O2 (APRIA) .   PSG in '02 showed severe OSA with AHI 40/h, lowest desatn to 52% correctd by 8 cm H2O . Her wt was 325 lbs then.  CPAP titration study >> required 10-16cm CPAP, higher pressure when supine/ REM sleep.  Download 07/2010 >>Good control of events on CPAP 13 cm - changed to this pressure.  12/2011 Download -CPAP 13 cm very effective  O2 dc'd 10/12   PFTs 06/2011 - no obstruction, mild restriction, FVC 78%, DLCO 64%    06/23/2015 Follow up : OSA  Pt returns for a 1 month follow up .   Mask is not fitting as well. Can not afford new mask . Can get new mask in August-this will be covered with insurance. Wants a new full face mask. .  ONO done on 05/24/16 shows significant desats on CPAP with 3l/m  Discussed getting CPAP titration to evaluate , she wants ONO repeated as she feels so much better and stronger now.  Feeling a lot better strength wise. Working with PT 3 x week. O2 sats 96 to 98% at PT . Not using O2.   She denies chest pain,  orthopnea , edema or fever.   Past Medical History  Diagnosis Date  . Morbid obesity ([place])   . Obesity hypoventilation syndrome ([place])   . Bipolar disorder ([place])   . Hypoxemia     history of - no home O2  . Cor pulmonale ([place])   . History of pleural effusion   . Spinal stenosis in cervical region   . Acute systolic congestive heart failure ([place])   . Seasonal allergies     current runny nose  . Arthritis     osteoarthritis bilal. knees  . Urinary incontinence   . Abrasion of breast 06/21/2011    left  . Sleep apnea sleep study 07/02/2010    uses CPAP nightly  . Asthma     no per PFT 7/13; reports does not have asthma  . Seizures ([place])     febrile seizure x 1 as a child. Several times  . IBS (irritable bowel syndrome)   . Carpal tunnel syndrome of left wrist 05/2011    Being evaluated for MS  . Prediabetes   . Agoraphobia   . Hyperlipidemia   . Hypertension   . Depression   . Anxiety     panic attacks  . Allergy    Current Outpatient Prescriptions on File Prior to  Visit  Medication Sig Dispense Refill  . acetaminophen (TYLENOL) 500 MG tablet Take 2,000 mg by mouth 2 (two) times daily. Taking every day per patient    . albuterol (PROVENTIL HFA;VENTOLIN HFA) 108 (90 Base) MCG/ACT inhaler Inhale 2 puffs into the lungs every 6 (six) hours as needed for wheezing or shortness of breath. 1 Inhaler 3  . ALPRAZolam (XANAX) 1 MG tablet Take 1 mg by mouth 3 (three) times daily.     . Ascorbic Acid (VITAMIN C) 500 MG tablet Take 1,000 mg by mouth daily.     [name] aspirin 81 MG tablet Take 81 mg by mouth daily.     [name] atorvastatin (LIPITOR) 20 MG tablet Take 1 tablet (20 mg total) by mouth daily at 6 PM. 30 tablet 0  . b complex vitamins tablet Take 1 tablet by mouth daily.    . carbamazepine (TEGRETOL) 200 MG tablet Take 200 mg by mouth at bedtime. Take 1 tablet in the morning and 2 tablets at night    . Cholecalciferol (VITAMIN D3) 5000 UNITS TABS Take 3 tablets by mouth daily.     .  Chromium Picolinate 500 MCG CAPS Take 1 capsule by mouth daily.    . clonazePAM (KLONOPIN) 1 MG tablet Take 1 tablet (1 mg total) by mouth 3 (three) times daily. 30 tablet 0  . furosemide (LASIX) 40 MG tablet Take 2 tablets (80 mg total) by mouth 2 (two) times daily. 360 tablet 3  . lamoTRIgine (LAMICTAL) 200 MG tablet Take 200 mg by mouth 2 (two) times daily.     [name] losartan (COZAAR) 25 MG tablet Take 1 tablet (25 mg total) by mouth 2 (two) times daily. KEEP OFFICE APPOINTMENT 180 tablet 0  . Lurasidone HCl (LATUDA) 120 MG TABS Take 1 tablet by mouth daily.    [name] LYRICA 300 MG capsule Take 1 tablet by mouth Twice daily.    [name] MAGNESIUM PO Take 1 tablet by mouth daily.    . Meloxicam (MOBIC PO) Take 7.5 mg by mouth every 12 (twelve) hours.     . metFORMIN (GLUCOPHAGE) 500 MG tablet TAKE 1 TABLET BY MOUTH TWICE DAILY WITH MEALS 180 tablet 1  . methylphenidate (RITALIN) 10 MG tablet 5-20 mg. Takes 20 mg morning and noon, and 5-10 mg at 4pm    . Multiple Vitamin (MULTIVITAMIN) tablet Take 1 tablet by mouth daily.      . norethindrone-ethinyl estradiol-iron (MICROGESTIN FE 1.5/30) 1.5-30 MG-MCG tablet Take 1 tablet by mouth at bedtime. Takes continuously    . potassium chloride SA (KLOR-CON M20) 20 MEQ tablet Take 1 tablet (20 mEq total) by mouth daily. 90 tablet 0  . Zinc 50 MG CAPS Take 50 mg by mouth daily.     No current facility-administered medications on file prior to visit.     Review of Systems Constitutional:   No  weight loss, night sweats,  Fevers, chills,  +fatigue, or  lassitude.  HEENT:   No headaches,  Difficulty swallowing,  Tooth/dental problems, or  Sore throat,                No sneezing, itching, ear ache, nasal congestion, post nasal drip,   CV:  No chest pain,  Orthopnea, PND, swelling in lower extremities, anasarca, dizziness, palpitations, syncope.   GI  No heartburn, indigestion, abdominal pain, nausea, vomiting, diarrhea, change in bowel habits, loss of appetite,  bloody stools.   Resp:  .  No excess mucus, no productive cough,  No non-productive cough,  No coughing up of blood.  No change in color of mucus.  No wheezing.  No chest wall deformity  Skin: no rash or lesions.  GU: no dysuria, change in color of urine, no urgency or frequency.  No flank pain, no hematuria   MS:  No joint pain or swelling.  No decreased range of motion.  No back pain.  Psych:  No change in mood or affect. No depression or anxiety.  No memory loss.         Objective:   Physical Exam Filed Vitals:   06/23/15 1605  BP: 118/62  Pulse: 92  Temp: 98.7 F (37.1 C)  TempSrc: Oral  Height: 5\' 5"  (1.651 m)  Weight: 369 lb (167.377 kg)  SpO2: 92%  Body mass index is 61.4 kg/(m^2).   GEN: A/Ox3; pleasant , NAD, morbidly obese   HEENT:  [place]/AT,  EACs-clear, TMs-wnl, NOSE-clear, THROAT-clear, no lesions, no postnasal drip or exudate noted. Class 3 MP   NECK:  Supple w/ fair ROM; no JVD; normal carotid impulses w/o bruits; no thyromegaly or nodules palpated; no lymphadenopathy.  RESP  Clear  P & A; w/o, wheezes/ rales/ or rhonchi.no accessory muscle use, no dullness to percussion  CARD:  RRR, no m/r/g  , no peripheral edema, pulses intact, no cyanosis or clubbing.  GI:   Soft & nt; nml bowel sounds; no organomegaly or masses detected.  Musco: Warm bil, no deformities or joint swelling noted.   Neuro: alert, no focal deficits noted.    Skin: Warm, no lesions or rashes  Challen Spainhour NP-C  [facility]  06/23/2015      Assessment & Plan:

## 2015-06-23 NOTE — Addendum Note (Signed)
Addended by: Osa Craver on: 06/23/2015 04:53 PM   Modules accepted: Orders

## 2015-06-23 NOTE — Patient Instructions (Addendum)
Repeat ONO on CPAP and Oxygen .  Continue on CPAP At bedtime   Work on weight loss.  CPAP download requested.  Follow up with Dr. Elsworth Soho  In 3-4 months and As needed   Discontinue portable oxygen .

## 2015-06-28 ENCOUNTER — Ambulatory Visit (INDEPENDENT_AMBULATORY_CARE_PROVIDER_SITE_OTHER): Payer: PRIVATE HEALTH INSURANCE | Admitting: Psychology

## 2015-06-28 DIAGNOSIS — F3181 Bipolar II disorder: Secondary | ICD-10-CM | POA: Diagnosis not present

## 2015-07-05 ENCOUNTER — Ambulatory Visit (INDEPENDENT_AMBULATORY_CARE_PROVIDER_SITE_OTHER): Payer: PRIVATE HEALTH INSURANCE | Admitting: Psychology

## 2015-07-05 DIAGNOSIS — F3181 Bipolar II disorder: Secondary | ICD-10-CM

## 2015-07-12 ENCOUNTER — Ambulatory Visit (INDEPENDENT_AMBULATORY_CARE_PROVIDER_SITE_OTHER): Payer: PRIVATE HEALTH INSURANCE | Admitting: Psychology

## 2015-07-12 DIAGNOSIS — F319 Bipolar disorder, unspecified: Secondary | ICD-10-CM

## 2015-07-18 ENCOUNTER — Telehealth: Payer: Self-pay | Admitting: Pulmonary Disease

## 2015-07-18 DIAGNOSIS — G4733 Obstructive sleep apnea (adult) (pediatric): Secondary | ICD-10-CM

## 2015-07-18 NOTE — Telephone Encounter (Signed)
Spoke with pt. She needs a new order sent to [facility] for repeat ONO. The order that was placed the last time she was here has expired. Huey Romans will need a new order. Order has been placed. Nothing further was needed.

## 2015-07-19 ENCOUNTER — Ambulatory Visit (INDEPENDENT_AMBULATORY_CARE_PROVIDER_SITE_OTHER): Payer: PRIVATE HEALTH INSURANCE | Admitting: Psychology

## 2015-07-19 DIAGNOSIS — F319 Bipolar disorder, unspecified: Secondary | ICD-10-CM | POA: Diagnosis not present

## 2015-07-21 NOTE — Progress Notes (Signed)
HPI This patient presents for evaluation of a mildly reduced ejection fraction. Since I last saw her she was in the hospital with respiratory failure.  This was in April.  I reviewed these records.  She was treated for CAP.   She had no evidence of PE on CT.  Echo showed the EF was stable at 50 - 55%.  Peak PA pressure was 45.    She presents for follow up. She's actually been doing quite well since then. She's doing physical therapy 3 times a week and is breathing well. She actually has not required any oxygen during the day but still wears at night.  She has an overnight pulse oximetry pending.  She denies any chest pressure, neck or arm discomfort. She's not having any palpitations, presyncope or syncope. She denies any PND or orthopnea. She manages her volume with when necessary diuretics.     Allergies  Allergen Reactions  . Savella [Milnacipran Hcl]     mania    Current Outpatient Prescriptions  Medication Sig Dispense Refill  . acetaminophen (TYLENOL) 500 MG tablet Take 2,000 mg by mouth 2 (two) times daily. Taking every day per patient    . albuterol (PROVENTIL HFA;VENTOLIN HFA) 108 (90 Base) MCG/ACT inhaler Inhale 2 puffs into the lungs every 6 (six) hours as needed for wheezing or shortness of breath. 1 Inhaler 3  . ALPRAZolam (XANAX) 1 MG tablet Take 1 mg by mouth 3 (three) times daily.     . Ascorbic Acid (VITAMIN C) 500 MG tablet Take 1,000 mg by mouth daily.     [name] aspirin 81 MG tablet Take 81 mg by mouth daily.     [name] atorvastatin (LIPITOR) 20 MG tablet Take 1 tablet (20 mg total) by mouth daily at 6 PM. 30 tablet 11  . b complex vitamins tablet Take 1 tablet by mouth daily.    . carbamazepine (TEGRETOL) 200 MG tablet Take 200 mg by mouth at bedtime. Take 1 tablet in the morning and 2 tablets at night    . Cholecalciferol (VITAMIN D3) 5000 UNITS TABS Take 3 tablets by mouth daily.     . Chromium Picolinate 500 MCG CAPS Take 1 capsule by mouth daily.    . clonazePAM  (KLONOPIN) 1 MG tablet Take 1 tablet (1 mg total) by mouth 3 (three) times daily. 30 tablet 0  . furosemide (LASIX) 40 MG tablet Take 2 tablets (80 mg total) by mouth 2 (two) times daily. 360 tablet 3  . lamoTRIgine (LAMICTAL) 200 MG tablet Take 200 mg by mouth 2 (two) times daily.     [name] losartan (COZAAR) 25 MG tablet Take 1 tablet (25 mg total) by mouth 2 (two) times daily. KEEP OFFICE APPOINTMENT 180 tablet 0  . Lurasidone HCl (LATUDA) 120 MG TABS Take 1 tablet by mouth daily.    [name] LYRICA 300 MG capsule Take 1 tablet by mouth Twice daily.    [name] MAGNESIUM PO Take 1 tablet by mouth daily.    . Meloxicam (MOBIC PO) Take 7.5 mg by mouth every 12 (twelve) hours.     . metFORMIN (GLUCOPHAGE) 500 MG tablet TAKE 1 TABLET BY MOUTH TWICE DAILY WITH MEALS 180 tablet 1  . methylphenidate (RITALIN) 10 MG tablet 5-20 mg. Takes 20 mg morning and noon, and 5-10 mg at 4pm    . Multiple Vitamin (MULTIVITAMIN) tablet Take 1 tablet by mouth daily.      . norethindrone-ethinyl estradiol-iron (MICROGESTIN FE 1.5/30) 1.5-30 MG-MCG tablet  Take 1 tablet by mouth at bedtime. Takes continuously    . potassium chloride SA (KLOR-CON M20) 20 MEQ tablet Take 1 tablet (20 mEq total) by mouth daily. 90 tablet 0  . Zinc 50 MG CAPS Take 50 mg by mouth daily.     No current facility-administered medications for this visit.    Past Medical History  Diagnosis Date  . Morbid obesity ([place])   . Obesity hypoventilation syndrome ([place])   . Bipolar disorder ([place])   . Hypoxemia     history of - no home O2  . Cor pulmonale ([place])   . History of pleural effusion   . Spinal stenosis in cervical region   . Acute systolic congestive heart failure ([place])   . Seasonal allergies     current runny nose  . Arthritis     osteoarthritis bilal. knees  . Urinary incontinence   . Abrasion of breast 06/21/2011    left  . Sleep apnea sleep study 07/02/2010    uses CPAP nightly  . Asthma     no per PFT 7/13; reports does not have asthma  .  Seizures ([place])     febrile seizure x 1 as a child. Several times  . IBS (irritable bowel syndrome)   . Carpal tunnel syndrome of left wrist 05/2011    Being evaluated for MS  . Prediabetes   . Agoraphobia   . Hyperlipidemia   . Hypertension   . Depression   . Anxiety     panic attacks  . Allergy     Past Surgical History  Procedure Laterality Date  . Wisdom tooth extraction    . Mass excision  08/07/2010    right index  . Carpal tunnel release  06/27/2011    Procedure: CARPAL TUNNEL RELEASE;  Surgeon: Wynonia Sours, MD;  Location: [facility];  Service: Orthopedics;  Laterality: Left;  . Posterior cervical fusion/foraminotomy  09/11/2011    Procedure: POSTERIOR CERVICAL FUSION/FORAMINOTOMY LEVEL 1;  Surgeon: Erline Levine, MD;  Location: [facility];  Service: Neurosurgery;  Laterality: N/A;  Cervical Three-Four Posteior Cervical Fusion and Decompression.  . Carpal tunnel release  12/19/2011    Procedure: CARPAL TUNNEL RELEASE;  Surgeon: Wynonia Sours, MD;  Location: [facility];  Service: Orthopedics;  Laterality: Right;  . Trigger finger release  12/19/2011    Procedure: RELEASE TRIGGER FINGER/A-1 PULLEY;  Surgeon: Wynonia Sours, MD;  Location: [facility];  Service: Orthopedics;  Laterality: Right;  . Trigger finger release Left 12/31/2012    Procedure: RELEASE A-1 PULLEY LEFT THUMB;  Surgeon: Wynonia Sours, MD;  Location: [facility];  Service: Orthopedics;  Laterality: Left;    ROS:  As stated in the HPI and negative for all other systems.  PHYSICAL EXAM BP 158/60 mmHg  Pulse 95  Ht 5\' 3"  (1.6 m)  Wt 363 lb (164.656 kg)  BMI 64.32 kg/m2 GENERAL:  Well appearing LUNGS:  Clear to auscultation bilaterally BACK:  No CVA tenderness CHEST:  Unremarkable HEART:  PMI not displaced or sustained,S1 and S2 within normal limits, no S3, no S4, no clicks, no rubs, no murmurs ABD:  Flat, positive bowel sounds normal in frequency in  pitch, no bruits, no rebound, no guarding, no midline pulsatile mass, no hepatomegaly, no splenomegaly, morbidly obese EXT:  2 plus pulses throughout, no edema, no cyanosis no clubbing PSYCH:  Cognitively intact, oriented to person place, animated today.   Lab Results  Component Value Date   CHOL 271* 05/19/2015   TRIG 166.0* 05/19/2015   HDL 75.40 05/19/2015   LDLCALC 162* 05/19/2015     ASSESSMENT AND PLAN  Cardiomyopathy - She has a mild cardiomyopathy and she seems to be euvolemic today.  No change in therapy is indicated.   Her pulmonary artery pressures seem to be stable. Right ventricular function is normal with some mild dilatation. Left ventricular function is low-normal on the recent echo. No change in therapy is indicated.  Pulmonary HTN - Stable as above.   HTN -  Her BP is elevated but but I repeated before she left the office and it was not elevated. It is not elevated at home. No change in therapy is indicated  Anxiety -  She seems to be in good spirits today  Dyslipidemia - Her LDL was elevated in the hospital and she was started on Lipitor. I will continue this.  Hospital records and echo reviewed.

## 2015-07-22 ENCOUNTER — Encounter: Payer: Self-pay | Admitting: Cardiology

## 2015-07-22 ENCOUNTER — Ambulatory Visit (INDEPENDENT_AMBULATORY_CARE_PROVIDER_SITE_OTHER): Payer: 59 | Admitting: Cardiology

## 2015-07-22 VITALS — BP 158/60 | HR 95 | Ht 63.0 in | Wt 363.0 lb

## 2015-07-22 DIAGNOSIS — I420 Dilated cardiomyopathy: Secondary | ICD-10-CM

## 2015-07-22 MED ORDER — ATORVASTATIN CALCIUM 20 MG PO TABS: 20.0000 mg | ORAL_TABLET | Freq: Every day | ORAL | Status: DC

## 2015-07-22 NOTE — Patient Instructions (Signed)
Your physician wants you to follow-up in: 1 Year. You will receive a reminder letter in the mail two months in advance. If you don't receive a letter, please call our office to schedule the follow-up appointment.  

## 2015-07-26 ENCOUNTER — Ambulatory Visit (INDEPENDENT_AMBULATORY_CARE_PROVIDER_SITE_OTHER): Payer: PRIVATE HEALTH INSURANCE | Admitting: Psychology

## 2015-07-26 DIAGNOSIS — F319 Bipolar disorder, unspecified: Secondary | ICD-10-CM | POA: Diagnosis not present

## 2015-07-28 ENCOUNTER — Telehealth: Payer: Self-pay | Admitting: Family Medicine

## 2015-07-28 MED ORDER — LIDOCAINE 5 % EX PTCH: 1.0000 | MEDICATED_PATCH | Freq: Every day | CUTANEOUS | Status: DC | PRN

## 2015-07-28 NOTE — Telephone Encounter (Signed)
Pt need new Rx lidocaine (Lidoderm) Patch  5%   Pharm:  [facility]  [address]   Pt has been working out doing cardio 3x a week for a hour.

## 2015-08-01 ENCOUNTER — Telehealth: Payer: Self-pay | Admitting: Pulmonary Disease

## 2015-08-01 ENCOUNTER — Telehealth: Payer: Self-pay | Admitting: Adult Health

## 2015-08-01 DIAGNOSIS — G4733 Obstructive sleep apnea (adult) (pediatric): Secondary | ICD-10-CM

## 2015-08-01 NOTE — Telephone Encounter (Signed)
Patient states that she had ONO done and wants to know results.  Have not received the results from ONO, called [facility] to get results. Patient aware that Dr. Elsworth Soho will not be back in to review notes until Wednesday and she is okay waiting until then. Brandi from [place] states that ONO was picked up on 07/22/15 but has not been downloaded yet.  Velna Hatchet stated that she will request the results and have them faxed to [place]. Awaiting fax. Will hold in my box until received.

## 2015-08-01 NOTE — Telephone Encounter (Signed)
Per verbal order from TP after reviewing ONO with CPAP on 3L ONO shows desats  Would set up for CPAP titration  LVM for pt to return call.

## 2015-08-03 NOTE — Telephone Encounter (Signed)
I have checked RA's look at and up front, this ONO report has not been received yet. Will await fax.

## 2015-08-03 NOTE — Telephone Encounter (Signed)
Received ONO results and placed on Dr. Bari Mantis desk for review. Will await Dr. Bari Mantis response. I will close encounter once completed.

## 2015-08-04 ENCOUNTER — Telehealth: Payer: Self-pay

## 2015-08-04 MED ORDER — LIDOCAINE 5 % EX PTCH: 1.0000 | MEDICATED_PATCH | Freq: Every day | CUTANEOUS | Status: DC | PRN

## 2015-08-04 NOTE — Telephone Encounter (Signed)
ONO results show desaturation  Suggest CPAP +02 titration study ------------------------------  Left message for patient to call back.

## 2015-08-04 NOTE — Telephone Encounter (Signed)
Medication sent in for patient. 

## 2015-08-04 NOTE — Telephone Encounter (Signed)
Okay to refill lidocaine patch for knee pain x 1 month

## 2015-08-04 NOTE — Telephone Encounter (Signed)
Last refill #30 was 07/28/2015 Last seen on 05/19/2015 Pending appt 08/18/2015 Please advise in Dr. Julianne Rice absence Thanks

## 2015-08-05 ENCOUNTER — Other Ambulatory Visit: Payer: Self-pay | Admitting: *Deleted

## 2015-08-05 NOTE — Telephone Encounter (Signed)
lmcb x2 for pt. 

## 2015-08-08 NOTE — Telephone Encounter (Signed)
Will make change to settings based on rpt titration study

## 2015-08-08 NOTE — Telephone Encounter (Signed)
Pt is aware of results. Order has been placed for CPAP/oxygen titration study. Nothing further was needed.

## 2015-08-08 NOTE — Telephone Encounter (Signed)
Called maed pt aware of below. She verbalized understanding and needed nothing further.

## 2015-08-08 NOTE — Telephone Encounter (Signed)
Last ov with TP on 06/23/15 Patient Instructions       Repeat ONO on CPAP and Oxygen .   Continue on CPAP At bedtime    Work on weight loss.   CPAP download requested.   Follow up with Dr. Elsworth Soho  In 3-4 months and As needed    Discontinue portable oxygen .     Pt returned call. I reviewed results and recs. She is scheduled for a CPAP titration on 08/26/15. She is requesting a message be sent to RA to make him aware of an oral surgery that her orthodontist feels is necessary. She explained it is scheduled for 09/06/15 and would improve her breathing. I informed her that I would send a message to make him aware. She voiced understanding and had no further questions.  RA please advise

## 2015-08-09 ENCOUNTER — Ambulatory Visit (INDEPENDENT_AMBULATORY_CARE_PROVIDER_SITE_OTHER): Payer: PRIVATE HEALTH INSURANCE | Admitting: Psychology

## 2015-08-09 DIAGNOSIS — F319 Bipolar disorder, unspecified: Secondary | ICD-10-CM | POA: Diagnosis not present

## 2015-08-11 ENCOUNTER — Encounter: Payer: Self-pay | Admitting: Adult Health

## 2015-08-16 ENCOUNTER — Ambulatory Visit (INDEPENDENT_AMBULATORY_CARE_PROVIDER_SITE_OTHER): Payer: PRIVATE HEALTH INSURANCE | Admitting: Psychology

## 2015-08-16 DIAGNOSIS — F319 Bipolar disorder, unspecified: Secondary | ICD-10-CM | POA: Diagnosis not present

## 2015-08-18 ENCOUNTER — Ambulatory Visit: Payer: Self-pay | Admitting: Family Medicine

## 2015-08-21 ENCOUNTER — Encounter (HOSPITAL_BASED_OUTPATIENT_CLINIC_OR_DEPARTMENT_OTHER): Payer: Self-pay

## 2015-08-23 ENCOUNTER — Ambulatory Visit (INDEPENDENT_AMBULATORY_CARE_PROVIDER_SITE_OTHER): Payer: PRIVATE HEALTH INSURANCE | Admitting: Psychology

## 2015-08-23 DIAGNOSIS — F319 Bipolar disorder, unspecified: Secondary | ICD-10-CM

## 2015-08-26 ENCOUNTER — Ambulatory Visit (HOSPITAL_BASED_OUTPATIENT_CLINIC_OR_DEPARTMENT_OTHER): Payer: 59 | Attending: Pulmonary Disease | Admitting: Pulmonary Disease

## 2015-08-26 VITALS — Ht 65.0 in | Wt 363.0 lb

## 2015-08-26 DIAGNOSIS — G4733 Obstructive sleep apnea (adult) (pediatric): Secondary | ICD-10-CM

## 2015-08-26 DIAGNOSIS — R0683 Snoring: Secondary | ICD-10-CM | POA: Insufficient documentation

## 2015-08-26 DIAGNOSIS — G4730 Sleep apnea, unspecified: Secondary | ICD-10-CM | POA: Diagnosis present

## 2015-08-26 DIAGNOSIS — Z79899 Other long term (current) drug therapy: Secondary | ICD-10-CM | POA: Insufficient documentation

## 2015-08-29 ENCOUNTER — Telehealth: Payer: Self-pay | Admitting: Pulmonary Disease

## 2015-08-29 DIAGNOSIS — G4733 Obstructive sleep apnea (adult) (pediatric): Secondary | ICD-10-CM

## 2015-08-29 DIAGNOSIS — G4730 Sleep apnea, unspecified: Secondary | ICD-10-CM | POA: Diagnosis not present

## 2015-08-29 NOTE — Procedures (Signed)
Patient Name: [name], [name] Date: 08/26/2015 Gender: Female D.O.B: 11/25/1970 Age (years): 5 Referring Provider: Kara Mead MD, ABSM Height (inches): 65 Interpreting Physician: Kara Mead MD, ABSM Weight (lbs): 363 RPSGT: Baxter Flattery BMI: 60 MRN: [id number] Neck Size: 15.00   CLINICAL INFORMATION The patient is referred for a CPAP titration to treat sleep apnea.   SLEEP STUDY TECHNIQUE As per the AASM Manual for the Scoring of Sleep and Associated Events v2.3 (April 2016) with a hypopnea requiring 4% desaturations. The channels recorded and monitored were frontal, central and occipital EEG, electrooculogram (EOG), submentalis EMG (chin), nasal and oral airflow, thoracic and abdominal wall motion, anterior tibialis EMG, snore microphone, electrocardiogram, and pulse oximetry. Continuous positive airway pressure (CPAP) was initiated at the beginning of the study and titrated to treat sleep-disordered breathing.   MEDICATIONS Medications taken by the patient : LAMICTAL, Latuda, Lyrica, CLONAZEPAM  Medications administered by patient during sleep study : Sleep medicine administered - Clonazepam at 09:00:34 PM   RESPIRATORY PARAMETERS Optimal PAP Pressure (cm): 15 AHI at Optimal Pressure (/hr): 0.0 Overall Minimal O2 (%): 75.00 Supine % at Optimal Pressure (%): 0 Minimal O2 at Optimal Pressure (%): 86.0     SLEEP ARCHITECTURE The study was initiated at 10:46:16 PM and ended at 5:09:47 AM. Sleep onset time was 11.3 minutes and the sleep efficiency was 93.5%. The total sleep time was 358.7 minutes. The patient spent 1.59% of the night in stage N1 sleep, 80.43% in stage N2 sleep, 0.00% in stage N3 and 17.98% in REM.Stage REM latency was 130.0 minutes Wake after sleep onset was 13.5. Alpha intrusion was absent. Supine sleep was 0.00%.   CARDIAC DATA The 2 lead EKG demonstrated sinus rhythm. The mean heart rate was 82.61 beats per minute. Other EKG findings include:  None.   LEG MOVEMENT DATA The total Periodic Limb Movements of Sleep (PLMS) were 3. The PLMS index was 0.50. A PLMS index of <15 is considered normal in adults.   IMPRESSIONS - The optimal PAP pressure was 15 cm of water. - Central sleep apnea was not noted during this titration (CAI = 0.0/h). - Severe oxygen desaturations were observed during this titration (min O2 = 75.00%) corrected by 3L O2 - The patient snored with Soft snoring volume during this titration study. - No cardiac abnormalities were observed during this study. - Clinically significant periodic limb movements were not noted during this study. Arousals associated with PLMs were rare.   DIAGNOSIS - Obstructive Sleep Apnea (327.23 [G47.33 ICD-10])    RECOMMENDATIONS - Trial of CPAP therapy on 15 cm H2O with a Small size Resmed Full Face Mask AirFit F20 mask and heated humidification with 3L O2 blended in. - Avoid alcohol, sedatives and other CNS depressants that may worsen sleep apnea and disrupt normal sleep architecture. - Sleep hygiene should be reviewed to assess factors that may improve sleep quality. - Weight management and regular exercise should be initiated or continued. - Return to [facility] for re-evaluation after 4 weeks of therapy   Kara Mead MD. FCCP. [facility]  08/29/2015

## 2015-08-29 NOTE — Telephone Encounter (Signed)
Increase  CPAP therapy to 15 cm H2O  with 3L O2 blended in. Download in 1 month

## 2015-08-30 ENCOUNTER — Ambulatory Visit (INDEPENDENT_AMBULATORY_CARE_PROVIDER_SITE_OTHER): Payer: PRIVATE HEALTH INSURANCE | Admitting: Psychology

## 2015-08-30 DIAGNOSIS — F319 Bipolar disorder, unspecified: Secondary | ICD-10-CM

## 2015-08-30 NOTE — Telephone Encounter (Signed)
Attempted to contact patient, left message for patient to return call.

## 2015-08-31 NOTE — Telephone Encounter (Signed)
Pt states she already wears 3lpm 02 qhs bled into her cpap.   Please advise.  Thanks!

## 2015-08-31 NOTE — Telephone Encounter (Signed)
ok 

## 2015-08-31 NOTE — Telephone Encounter (Signed)
Spoke with pt, aware of recs.  Pt would like to know the exact number of apneas she is having at night at her 13cm pressure.  Pt is ok with increasing pressure to 15.   This has been ordered.   RA please advise.  Thanks.

## 2015-08-31 NOTE — Telephone Encounter (Signed)
Pt returning call.[name] ° °

## 2015-08-31 NOTE — Telephone Encounter (Signed)
lmtcb X1 for pt to make aware of few residual apneas qhs

## 2015-08-31 NOTE — Telephone Encounter (Signed)
She was having a few residual events-but more importantly oxygen level was dropping Needs 3 L oxygen blended into CPAP

## 2015-09-01 NOTE — Telephone Encounter (Signed)
[phone] or [phone], pt cb

## 2015-09-01 NOTE — Telephone Encounter (Signed)
Spoke with the pt and notified of recs per RA  She verbalized understanding and nothing further needed

## 2015-09-05 ENCOUNTER — Other Ambulatory Visit: Payer: Self-pay | Admitting: Cardiology

## 2015-09-05 MED ORDER — LOSARTAN POTASSIUM 25 MG PO TABS: 25.0000 mg | ORAL_TABLET | Freq: Two times a day (BID) | ORAL | 3 refills | Status: DC

## 2015-09-06 ENCOUNTER — Ambulatory Visit (INDEPENDENT_AMBULATORY_CARE_PROVIDER_SITE_OTHER): Payer: PRIVATE HEALTH INSURANCE | Admitting: Psychology

## 2015-09-06 DIAGNOSIS — F319 Bipolar disorder, unspecified: Secondary | ICD-10-CM | POA: Diagnosis not present

## 2015-09-13 ENCOUNTER — Ambulatory Visit (INDEPENDENT_AMBULATORY_CARE_PROVIDER_SITE_OTHER): Payer: PRIVATE HEALTH INSURANCE | Admitting: Psychology

## 2015-09-13 DIAGNOSIS — F319 Bipolar disorder, unspecified: Secondary | ICD-10-CM | POA: Diagnosis not present

## 2015-09-20 ENCOUNTER — Ambulatory Visit (INDEPENDENT_AMBULATORY_CARE_PROVIDER_SITE_OTHER): Payer: PRIVATE HEALTH INSURANCE | Admitting: Psychology

## 2015-09-20 DIAGNOSIS — F319 Bipolar disorder, unspecified: Secondary | ICD-10-CM | POA: Diagnosis not present

## 2015-09-27 ENCOUNTER — Ambulatory Visit (INDEPENDENT_AMBULATORY_CARE_PROVIDER_SITE_OTHER): Payer: PRIVATE HEALTH INSURANCE | Admitting: Psychology

## 2015-09-27 DIAGNOSIS — F319 Bipolar disorder, unspecified: Secondary | ICD-10-CM

## 2015-10-04 ENCOUNTER — Ambulatory Visit (INDEPENDENT_AMBULATORY_CARE_PROVIDER_SITE_OTHER): Payer: PRIVATE HEALTH INSURANCE | Admitting: Psychology

## 2015-10-04 DIAGNOSIS — F319 Bipolar disorder, unspecified: Secondary | ICD-10-CM

## 2015-10-11 ENCOUNTER — Ambulatory Visit (INDEPENDENT_AMBULATORY_CARE_PROVIDER_SITE_OTHER): Payer: PRIVATE HEALTH INSURANCE | Admitting: Psychology

## 2015-10-11 DIAGNOSIS — F319 Bipolar disorder, unspecified: Secondary | ICD-10-CM

## 2015-10-12 ENCOUNTER — Other Ambulatory Visit: Payer: Self-pay | Admitting: *Deleted

## 2015-10-12 MED ORDER — POTASSIUM CHLORIDE CRYS ER 20 MEQ PO TBCR: 20.0000 meq | EXTENDED_RELEASE_TABLET | Freq: Every day | ORAL | 2 refills | Status: DC

## 2015-10-15 ENCOUNTER — Other Ambulatory Visit: Payer: Self-pay | Admitting: Family Medicine

## 2015-10-18 ENCOUNTER — Ambulatory Visit (INDEPENDENT_AMBULATORY_CARE_PROVIDER_SITE_OTHER): Payer: PRIVATE HEALTH INSURANCE | Admitting: Psychology

## 2015-10-18 DIAGNOSIS — F319 Bipolar disorder, unspecified: Secondary | ICD-10-CM | POA: Diagnosis not present

## 2015-10-25 ENCOUNTER — Ambulatory Visit (INDEPENDENT_AMBULATORY_CARE_PROVIDER_SITE_OTHER): Payer: PRIVATE HEALTH INSURANCE | Admitting: Psychology

## 2015-10-25 DIAGNOSIS — F319 Bipolar disorder, unspecified: Secondary | ICD-10-CM

## 2015-10-26 LAB — HM PAP SMEAR: HM PAP: 9272017

## 2015-11-01 ENCOUNTER — Ambulatory Visit (INDEPENDENT_AMBULATORY_CARE_PROVIDER_SITE_OTHER): Payer: PRIVATE HEALTH INSURANCE | Admitting: Psychology

## 2015-11-01 ENCOUNTER — Ambulatory Visit: Payer: Self-pay | Admitting: Pulmonary Disease

## 2015-11-01 DIAGNOSIS — F319 Bipolar disorder, unspecified: Secondary | ICD-10-CM

## 2015-11-03 ENCOUNTER — Ambulatory Visit: Payer: Self-pay | Admitting: Family Medicine

## 2015-11-08 ENCOUNTER — Ambulatory Visit (INDEPENDENT_AMBULATORY_CARE_PROVIDER_SITE_OTHER): Payer: PRIVATE HEALTH INSURANCE | Admitting: Psychology

## 2015-11-08 DIAGNOSIS — F319 Bipolar disorder, unspecified: Secondary | ICD-10-CM | POA: Diagnosis not present

## 2015-11-10 ENCOUNTER — Other Ambulatory Visit: Payer: Self-pay | Admitting: Family Medicine

## 2015-11-15 ENCOUNTER — Ambulatory Visit (INDEPENDENT_AMBULATORY_CARE_PROVIDER_SITE_OTHER): Payer: PRIVATE HEALTH INSURANCE | Admitting: Psychology

## 2015-11-15 DIAGNOSIS — F319 Bipolar disorder, unspecified: Secondary | ICD-10-CM | POA: Diagnosis not present

## 2015-11-20 ENCOUNTER — Other Ambulatory Visit: Payer: Self-pay | Admitting: Cardiology

## 2015-11-21 NOTE — Telephone Encounter (Signed)
Rx(s) sent to pharmacy electronically.  

## 2015-11-22 ENCOUNTER — Ambulatory Visit (INDEPENDENT_AMBULATORY_CARE_PROVIDER_SITE_OTHER): Payer: PRIVATE HEALTH INSURANCE | Admitting: Psychology

## 2015-11-22 DIAGNOSIS — F319 Bipolar disorder, unspecified: Secondary | ICD-10-CM

## 2015-11-24 ENCOUNTER — Ambulatory Visit: Payer: Self-pay

## 2015-11-24 ENCOUNTER — Encounter: Payer: Self-pay | Admitting: Family Medicine

## 2015-11-24 ENCOUNTER — Ambulatory Visit (INDEPENDENT_AMBULATORY_CARE_PROVIDER_SITE_OTHER): Payer: 59 | Admitting: Family Medicine

## 2015-11-24 VITALS — BP 126/84 | HR 85 | Temp 98.0°F | Ht 65.0 in | Wt 361.0 lb

## 2015-11-24 DIAGNOSIS — Z0000 Encounter for general adult medical examination without abnormal findings: Secondary | ICD-10-CM

## 2015-11-24 DIAGNOSIS — E785 Hyperlipidemia, unspecified: Secondary | ICD-10-CM

## 2015-11-24 DIAGNOSIS — E662 Morbid (severe) obesity with alveolar hypoventilation: Secondary | ICD-10-CM

## 2015-11-24 DIAGNOSIS — I429 Cardiomyopathy, unspecified: Secondary | ICD-10-CM

## 2015-11-24 DIAGNOSIS — I5022 Chronic systolic (congestive) heart failure: Secondary | ICD-10-CM

## 2015-11-24 DIAGNOSIS — E118 Type 2 diabetes mellitus with unspecified complications: Secondary | ICD-10-CM | POA: Diagnosis not present

## 2015-11-24 DIAGNOSIS — I10 Essential (primary) hypertension: Secondary | ICD-10-CM | POA: Diagnosis not present

## 2015-11-24 DIAGNOSIS — J45909 Unspecified asthma, uncomplicated: Secondary | ICD-10-CM

## 2015-11-24 DIAGNOSIS — F319 Bipolar disorder, unspecified: Secondary | ICD-10-CM

## 2015-11-24 MED ORDER — ALBUTEROL SULFATE HFA 108 (90 BASE) MCG/ACT IN AERS: 2.0000 | INHALATION_SPRAY | Freq: Four times a day (QID) | RESPIRATORY_TRACT | 3 refills | Status: DC | PRN

## 2015-11-24 NOTE — Patient Instructions (Addendum)
BEFORE YOU LEAVE: -labs -Medicare exam with Manuela Schwartz before you leave today -follow up: 3-4 months  We have ordered labs or studies at this visit. It can take up to 1-2 weeks for results and processing. IF results require follow up or explanation, we will call you with instructions. Clinically stable results will be released to your [facility]. If you have not heard from [place] or cannot find your results in [facility] in 2 weeks please contact our office at [phone].  If you are not yet signed up for [facility], please consider signing up.    We recommend the following healthy lifestyle for LIFE: 1) Small portions.   Tip: eat off of a salad plate instead of a dinner plate.  Tip: It is ok to feel hungry after a meal  Tip: if you need more or a snack choose fruits, veggies and/or a handful of nuts or seeds.  2) Eat a healthy clean diet.  * Tip: Avoid (less then 1 serving per week): processed foods, sweets, sweetened drinks, white starches (rice, flour, bread, potatoes, pasta, etc), red meat, fast foods, butter  *Tip: CHOOSE instead   * 5-9 servings per day of fresh or frozen fruits and vegetables (but not corn, potatoes, bananas, canned or dried fruit)   *nuts and seeds, beans   *olives and olive oil   *small portions of lean meats such as fish and white chicken    *small portions of whole grains  3)Get at least 150 minutes of sweaty aerobic exercise per week.  4)Reduce stress - consider counseling, meditation and relaxation to balance other aspects of your life.   Ms. Karr , Thank you for taking time to come for your Medicare Wellness Visit. I appreciate your ongoing commitment to your health goals. Please review the following plan we discussed and let me know if I can assist you in the future.   Will have mammogram soon   Exercise; to continue with the exercise!!   These are the goals we discussed: Goals    None      This is a list of the screening recommended for you and due  dates:  Health Maintenance  Topic Date Due  . Eye exam for diabetics  03/19/2015  . Pneumococcal vaccine (1) 11/23/2016*  . Flu Shot  11/23/2016*  . Hemoglobin A1C  05/24/2016  . Pap Smear  08/29/2016  . Complete foot exam   11/23/2016  . Tetanus Vaccine  03/18/2024  . HIV Screening  Completed  *Topic was postponed. The date shown is not the original due date.   Health Maintenance, Female Adopting a healthy lifestyle and getting preventive care can go a long way to promote health and wellness. Talk with your health care provider about what schedule of regular examinations is right for you. This is a good chance for you to check in with your provider about disease prevention and staying healthy. In between checkups, there are plenty of things you can do on your own. Experts have done a lot of research about which lifestyle changes and preventive measures are most likely to keep you healthy. Ask your health care provider for more information. WEIGHT AND DIET  Eat a healthy diet  Be sure to include plenty of vegetables, fruits, low-fat dairy products, and lean protein.  Do not eat a lot of foods high in solid fats, added sugars, or salt.  Get regular exercise. This is one of the most important things you can do for your health.  Most  adults should exercise for at least 150 minutes each week. The exercise should increase your heart rate and make you sweat (moderate-intensity exercise).  Most adults should also do strengthening exercises at least twice a week. This is in addition to the moderate-intensity exercise.  Maintain a healthy weight  Body mass index (BMI) is a measurement that can be used to identify possible weight problems. It estimates body fat based on height and weight. Your health care provider can help determine your BMI and help you achieve or maintain a healthy weight.  For females 37 years of age and older:   A BMI below 18.5 is considered underweight.  A BMI of  18.5 to 24.9 is normal.  A BMI of 25 to 29.9 is considered overweight.  A BMI of 30 and above is considered obese.  Watch levels of cholesterol and blood lipids  You should start having your blood tested for lipids and cholesterol at 46 years of age, then have this test every 5 years.  You may need to have your cholesterol levels checked more often if:  Your lipid or cholesterol levels are high.  You are older than 46 years of age.  You are at high risk for heart disease.  CANCER SCREENING   Lung Cancer  Lung cancer screening is recommended for adults 67-31 years old who are at high risk for lung cancer because of a history of smoking.  A yearly low-dose CT scan of the lungs is recommended for people who:  Currently smoke.  Have quit within the past 15 years.  Have at least a 30-pack-year history of smoking. A pack year is smoking an average of one pack of cigarettes a day for 1 year.  Yearly screening should continue until it has been 15 years since you quit.  Yearly screening should stop if you develop a health problem that would prevent you from having lung cancer treatment.  Breast Cancer  Practice breast self-awareness. This means understanding how your breasts normally appear and feel.  It also means doing regular breast self-exams. Let your health care provider know about any changes, no matter how small.  If you are in your 20s or 30s, you should have a clinical breast exam (CBE) by a health care provider every 1-3 years as part of a regular health exam.  If you are 6 or older, have a CBE every year. Also consider having a breast X-ray (mammogram) every year.  If you have a family history of breast cancer, talk to your health care provider about genetic screening.  If you are at high risk for breast cancer, talk to your health care provider about having an MRI and a mammogram every year.  Breast cancer gene (BRCA) assessment is recommended for women who  have family members with BRCA-related cancers. BRCA-related cancers include:  Breast.  Ovarian.  Tubal.  Peritoneal cancers.  Results of the assessment will determine the need for genetic counseling and BRCA1 and BRCA2 testing. Cervical Cancer Your health care provider may recommend that you be screened regularly for cancer of the pelvic organs (ovaries, uterus, and vagina). This screening involves a pelvic examination, including checking for microscopic changes to the surface of your cervix (Pap test). You may be encouraged to have this screening done every 3 years, beginning at age 75.  For women ages 74-65, health care providers may recommend pelvic exams and Pap testing every 3 years, or they may recommend the Pap and pelvic exam, combined with testing for  human papilloma virus (HPV), every 5 years. Some types of HPV increase your risk of cervical cancer. Testing for HPV may also be done on women of any age with unclear Pap test results.  Other health care providers may not recommend any screening for nonpregnant women who are considered low risk for pelvic cancer and who do not have symptoms. Ask your health care provider if a screening pelvic exam is right for you.  If you have had past treatment for cervical cancer or a condition that could lead to cancer, you need Pap tests and screening for cancer for at least 20 years after your treatment. If Pap tests have been discontinued, your risk factors (such as having a new sexual partner) need to be reassessed to determine if screening should resume. Some women have medical problems that increase the chance of getting cervical cancer. In these cases, your health care provider may recommend more frequent screening and Pap tests. Colorectal Cancer  This type of cancer can be detected and often prevented.  Routine colorectal cancer screening usually begins at 46 years of age and continues through 46 years of age.  Your health care provider  may recommend screening at an earlier age if you have risk factors for colon cancer.  Your health care provider may also recommend using home test kits to check for hidden blood in the stool.  A small camera at the end of a tube can be used to examine your colon directly (sigmoidoscopy or colonoscopy). This is done to check for the earliest forms of colorectal cancer.  Routine screening usually begins at age 34.  Direct examination of the colon should be repeated every 5-10 years through 46 years of age. However, you may need to be screened more often if early forms of precancerous polyps or small growths are found. Skin Cancer  Check your skin from head to toe regularly.  Tell your health care provider about any new moles or changes in moles, especially if there is a change in a mole's shape or color.  Also tell your health care provider if you have a mole that is larger than the size of a pencil eraser.  Always use sunscreen. Apply sunscreen liberally and repeatedly throughout the day.  Protect yourself by wearing long sleeves, pants, a wide-brimmed hat, and sunglasses whenever you are outside. HEART DISEASE, DIABETES, AND HIGH BLOOD PRESSURE   High blood pressure causes heart disease and increases the risk of stroke. High blood pressure is more likely to develop in:  People who have blood pressure in the high end of the normal range (130-139/85-89 mm Hg).  People who are overweight or obese.  People who are African American.  If you are 88-37 years of age, have your blood pressure checked every 3-5 years. If you are 31 years of age or older, have your blood pressure checked every year. You should have your blood pressure measured twice--once when you are at a hospital or clinic, and once when you are not at a hospital or clinic. Record the average of the two measurements. To check your blood pressure when you are not at a hospital or clinic, you can use:  An automated blood  pressure machine at a pharmacy.  A home blood pressure monitor.  If you are between 5 years and 21 years old, ask your health care provider if you should take aspirin to prevent strokes.  Have regular diabetes screenings. This involves taking a blood sample to check your fasting  blood sugar level.  If you are at a normal weight and have a low risk for diabetes, have this test once every three years after 46 years of age.  If you are overweight and have a high risk for diabetes, consider being tested at a younger age or more often. PREVENTING INFECTION  Hepatitis B  If you have a higher risk for hepatitis B, you should be screened for this virus. You are considered at high risk for hepatitis B if:  You were born in a country where hepatitis B is common. Ask your health care provider which countries are considered high risk.  Your parents were born in a high-risk country, and you have not been immunized against hepatitis B (hepatitis B vaccine).  You have HIV or AIDS.  You use needles to inject street drugs.  You live with someone who has hepatitis B.  You have had sex with someone who has hepatitis B.  You get hemodialysis treatment.  You take certain medicines for conditions, including cancer, organ transplantation, and autoimmune conditions. Hepatitis C  Blood testing is recommended for:  Everyone born from [birth year] through 1966.  Anyone with known risk factors for hepatitis C. Sexually transmitted infections (STIs)  You should be screened for sexually transmitted infections (STIs) including gonorrhea and chlamydia if:  You are sexually active and are younger than 46 years of age.  You are older than 46 years of age and your health care provider tells you that you are at risk for this type of infection.  Your sexual activity has changed since you were last screened and you are at an increased risk for chlamydia or gonorrhea. Ask your health care provider if you are at  risk.  If you do not have HIV, but are at risk, it may be recommended that you take a prescription medicine daily to prevent HIV infection. This is called pre-exposure prophylaxis (PrEP). You are considered at risk if:  You are sexually active and do not regularly use condoms or know the HIV status of your partner(s).  You take drugs by injection.  You are sexually active with a partner who has HIV. Talk with your health care provider about whether you are at high risk of being infected with HIV. If you choose to begin PrEP, you should first be tested for HIV. You should then be tested every 3 months for as long as you are taking PrEP.  PREGNANCY   If you are premenopausal and you may become pregnant, ask your health care provider about preconception counseling.  If you may become pregnant, take 400 to 800 micrograms (mcg) of folic acid every day.  If you want to prevent pregnancy, talk to your health care provider about birth control (contraception). OSTEOPOROSIS AND MENOPAUSE   Osteoporosis is a disease in which the bones lose minerals and strength with aging. This can result in serious bone fractures. Your risk for osteoporosis can be identified using a bone density scan.  If you are 36 years of age or older, or if you are at risk for osteoporosis and fractures, ask your health care provider if you should be screened.  Ask your health care provider whether you should take a calcium or vitamin D supplement to lower your risk for osteoporosis.  Menopause may have certain physical symptoms and risks.  Hormone replacement therapy may reduce some of these symptoms and risks. Talk to your health care provider about whether hormone replacement therapy is right for you.  HOME  CARE INSTRUCTIONS   Schedule regular health, dental, and eye exams.  Stay current with your immunizations.   Do not use any tobacco products including cigarettes, chewing tobacco, or electronic cigarettes.  If  you are pregnant, do not drink alcohol.  If you are breastfeeding, limit how much and how often you drink alcohol.  Limit alcohol intake to no more than 1 drink per day for nonpregnant women. One drink equals 12 ounces of beer, 5 ounces of wine, or 1 ounces of hard liquor.  Do not use street drugs.  Do not share needles.  Ask your health care provider for help if you need support or information about quitting drugs.  Tell your health care provider if you often feel depressed.  Tell your health care provider if you have ever been abused or do not feel safe at home.   This information is not intended to replace advice given to you by your health care provider. Make sure you discuss any questions you have with your health care provider.   Document Released: 07/31/2010 Document Revised: 02/05/2014 Document Reviewed: 12/17/2012 Elsevier Interactive Patient Education 2016 [place] in the Home  Falls can cause injuries. They can happen to people of all ages. There are many things you can do to make your home safe and to help prevent falls.  WHAT CAN I DO ON THE OUTSIDE OF MY HOME?  Regularly fix the edges of walkways and driveways and fix any cracks.  Remove anything that might make you trip as you walk through a door, such as a raised step or threshold.  Trim any bushes or trees on the path to your home.  Use bright outdoor lighting.  Clear any walking paths of anything that might make someone trip, such as rocks or tools.  Regularly check to see if handrails are loose or broken. Make sure that both sides of any steps have handrails.  Any raised decks and porches should have guardrails on the edges.  Have any leaves, snow, or ice cleared regularly.  Use sand or salt on walking paths during winter.  Clean up any spills in your garage right away. This includes oil or grease spills. WHAT CAN I DO IN THE BATHROOM?   Use night lights.  Install grab bars  by the toilet and in the tub and shower. Do not use towel bars as grab bars.  Use non-skid mats or decals in the tub or shower.  If you need to sit down in the shower, use a plastic, non-slip stool.  Keep the floor dry. Clean up any water that spills on the floor as soon as it happens.  Remove soap buildup in the tub or shower regularly.  Attach bath mats securely with double-sided non-slip rug tape.  Do not have throw rugs and other things on the floor that can make you trip. WHAT CAN I DO IN THE BEDROOM?  Use night lights.  Make sure that you have a light by your bed that is easy to reach.  Do not use any sheets or blankets that are too big for your bed. They should not hang down onto the floor.  Have a firm chair that has side arms. You can use this for support while you get dressed.  Do not have throw rugs and other things on the floor that can make you trip. WHAT CAN I DO IN THE KITCHEN?  Clean up any spills right away.  Avoid walking on wet  floors.  Keep items that you use a lot in easy-to-reach places.  If you need to reach something above you, use a strong step stool that has a grab bar.  Keep electrical cords out of the way.  Do not use floor polish or wax that makes floors slippery. If you must use wax, use non-skid floor wax.  Do not have throw rugs and other things on the floor that can make you trip. WHAT CAN I DO WITH MY STAIRS?  Do not leave any items on the stairs.  Make sure that there are handrails on both sides of the stairs and use them. Fix handrails that are broken or loose. Make sure that handrails are as long as the stairways.  Check any carpeting to make sure that it is firmly attached to the stairs. Fix any carpet that is loose or worn.  Avoid having throw rugs at the top or bottom of the stairs. If you do have throw rugs, attach them to the floor with carpet tape.  Make sure that you have a light switch at the top of the stairs and the  bottom of the stairs. If you do not have them, ask someone to add them for you. WHAT ELSE CAN I DO TO HELP PREVENT FALLS?  Wear shoes that:  Do not have high heels.  Have rubber bottoms.  Are comfortable and fit you well.  Are closed at the toe. Do not wear sandals.  If you use a stepladder:  Make sure that it is fully opened. Do not climb a closed stepladder.  Make sure that both sides of the stepladder are locked into place.  Ask someone to hold it for you, if possible.  Clearly mark and make sure that you can see:  Any grab bars or handrails.  First and last steps.  Where the edge of each step is.  Use tools that help you move around (mobility aids) if they are needed. These include:  Canes.  Walkers.  Scooters.  Crutches.  Turn on the lights when you go into a dark area. Replace any light bulbs as soon as they burn out.  Set up your furniture so you have a clear path. Avoid moving your furniture around.  If any of your floors are uneven, fix them.  If there are any pets around you, be aware of where they are.  Review your medicines with your doctor. Some medicines can make you feel dizzy. This can increase your chance of falling. Ask your doctor what other things that you can do to help prevent falls.   This information is not intended to replace advice given to you by your health care provider. Make sure you discuss any questions you have with your health care provider.   Document Released: 11/11/2008 Document Revised: 06/01/2014 Document Reviewed: 02/19/2014 Elsevier Interactive Patient Education [employer].

## 2015-11-24 NOTE — Progress Notes (Signed)
Pre visit review using our clinic review tool, if applicable. No additional management support is needed unless otherwise documented below in the visit note. 

## 2015-11-24 NOTE — Progress Notes (Signed)
Subjective:   [name] is a 45 y.o. female who presents for Medicare Annual (Subsequent) preventive examination.   Cardiac Risk Factors include: advanced age (>87men, >26 women);dyslipidemia;hypertension;sedentary lifestyle;obesity (BMI >30kg/m2) HRA assessment completed during this visit with [name].   The Patient was informed that the wellness visit is to identify future health risk and educate and initiate measures that can reduce risk for increased disease through the lifespan.    NO ROS; Medicare Wellness Visit Describes health as good, fair or great? Fair   Risk Associated with PMH  Disease processes  Mobility difficulty; due to weight and other issues Does not do grocery shopping; Cooks at times No baths; shower only  Keeps cell phone handy if she falls  Falls YES; states she has had multiple falls but is "clumsy".  Fell 3 days a ago on unlevel surfaces    States weight gain started with  Seroquel therapy. Stated she gained 300lb in 3 years;  Does not have any heart disease or pulmonary disease at present;  Was cleared by cardiology and pulmonary  Does have asthma meds reordered today  ;  Does weight every week or so on medical scale 361; target weight is 359;   Hearing loss no issues   Psychosocial; spouse is great support  Living situation; one level home Safe neighborhood Firearms; no firearms  MVA accidents this year: no   Primary Prevention Tobacco never smoked  ETOH no  Diet Breakfast; yogurt;  Sliced apples Peaches or pineapple Does not eat lunch Do eat sensible dinner; vegetables; protein and starch  Now borderline dm and has cut sweets back  On Metformin with a meal   Exercised Since May 1st does hour of cardio  2 to 3 times a week [place] [employer] the[employer]ist is her trainer  Dental; no; Planning on having orthodontia work   Clinical cytogeneticist for secondary risk Recently had eye exam; no diabetic retinopathy  Mammogram; to be  scheduled; will get at the breast center; do not need order; the patient will call and get apt Dexa < 65 Colonoscopy <50  EKG recent was normal    Vaccination update: does not take flu shots   Medications reviewed for issues; no issues  refilled her inhaler     Depression; anxiety or mood issues assessed.  States she goes through mania and depression quickly w ADD and agoraphobia  In individual therapy with Dr. Cheryln Manly  Will defer to Psych ongoing intervention.   Cognitive screen completed; MMSE documented or assessed for failures or issues with the AD8 screen below:   Ad8 score reviewed for issues;  Issues making decisions; no  Less interest in hobbies / activities" no  Repeats questions, stories; family complaining: NO  Trouble using ordinary gadgets; microwave; computer: no  Forgets the month or year: no  Mismanaging finances: no  Missing apt: no but does write them down  Daily problems with thinking of memory NO Ad8 score is 0  MMSE not appropriate unless AD8 score is > 2   Advanced Directive reviewed for completion or educated regarding [facility] form; the electing a health care agent and completing the Living Will. Reviewed the form today and agreed to take it home and review.    Established and updated Risk reviewed and appropriate referral made or health recommendations as appropriate based on individual needs and choices;   Current Care Team reviewed and updated         Objective:     Vitals: BP  126/84   Pulse 85   Temp 98 F (36.7 C) (Oral)   Ht 5\' 5"  (1.651 m)   Wt (!) 361 lb (163.7 kg)   BMI 60.07 kg/m   Body mass index is 60.07 kg/m.   Tobacco History  Smoking Status  . Never Smoker  Smokeless Tobacco  . Never Used     Counseling given: Not Answered   Past Medical History:  Diagnosis Date  . Abrasion of breast 06/21/2011   left  . Acute systolic congestive heart failure ([place])   . Agoraphobia   . Allergy   . Anxiety     panic attacks  . Arthritis    osteoarthritis bilal. knees  . Asthma    no per PFT 7/13; reports does not have asthma  . Bipolar disorder ([place])   . Carpal tunnel syndrome of left wrist 05/2011   Being evaluated for MS  . Cor pulmonale ([place])   . Depression   . History of pleural effusion   . Hyperlipidemia   . Hypertension   . Hypoxemia    history of - no home O2  . IBS (irritable bowel syndrome)   . Morbid obesity ([place])   . Obesity hypoventilation syndrome ([place])   . Prediabetes   . Seasonal allergies    current runny nose  . Seizures ([place])    febrile seizure x 1 as a child. Several times  . Sleep apnea sleep study 07/02/2010   uses CPAP nightly  . Spinal stenosis in cervical region   . Urinary incontinence    Past Surgical History:  Procedure Laterality Date  . CARPAL TUNNEL RELEASE  06/27/2011   Procedure: CARPAL TUNNEL RELEASE;  Surgeon: Wynonia Sours, MD;  Location: [place];  Service: Orthopedics;  Laterality: Left;  . CARPAL TUNNEL RELEASE  12/19/2011   Procedure: CARPAL TUNNEL RELEASE;  Surgeon: Wynonia Sours, MD;  Location: [place];  Service: Orthopedics;  Laterality: Right;  . MASS EXCISION  08/07/2010   right index  . POSTERIOR CERVICAL FUSION/FORAMINOTOMY  09/11/2011   Procedure: POSTERIOR CERVICAL FUSION/FORAMINOTOMY LEVEL 1;  Surgeon: Erline Levine, MD;  Location: [place] [facility];  Service: Neurosurgery;  Laterality: N/A;  Cervical Three-Four Posteior Cervical Fusion and Decompression.  [name] TRIGGER FINGER RELEASE  12/19/2011   Procedure: RELEASE TRIGGER FINGER/A-1 PULLEY;  Surgeon: Wynonia Sours, MD;  Location: [place];  Service: Orthopedics;  Laterality: Right;  . TRIGGER FINGER RELEASE Left 12/31/2012   Procedure: RELEASE A-1 PULLEY LEFT THUMB;  Surgeon: Wynonia Sours, MD;  Location: [place];  Service: Orthopedics;  Laterality: Left;  . WISDOM TOOTH EXTRACTION     Family History  Problem Relation Age of  Onset  . Asthma Mother   . Allergic rhinitis Mother   . Multiple sclerosis Mother   . Asthma Maternal Grandmother    History  Sexual Activity  . Sexual activity: Not on file    Outpatient Encounter Prescriptions as of 11/24/2015  Medication Sig  . acetaminophen (TYLENOL) 500 MG tablet Take 2,000 mg by mouth 2 (two) times daily. Taking every day per patient  . albuterol (PROVENTIL HFA;VENTOLIN HFA) 108 (90 Base) MCG/ACT inhaler Inhale 2 puffs into the lungs every 6 (six) hours as needed for wheezing or shortness of breath.  . ALPRAZolam (XANAX) 1 MG tablet Take 1 mg by mouth 3 (three) times daily.   . Ascorbic Acid (VITAMIN C) 500 MG tablet Take 1,000 mg by mouth daily.   [name]  aspirin 81 MG tablet Take 81 mg by mouth daily.   [name] atorvastatin (LIPITOR) 20 MG tablet Take 1 tablet (20 mg total) by mouth daily at 6 PM.  . b complex vitamins tablet Take 1 tablet by mouth daily.  . Cholecalciferol (VITAMIN D3) 5000 UNITS TABS Take 3 tablets by mouth daily.   . Chromium Picolinate 500 MCG CAPS Take 1 capsule by mouth daily.  . clonazePAM (KLONOPIN) 1 MG tablet Take 1 tablet (1 mg total) by mouth 3 (three) times daily.  . furosemide (LASIX) 40 MG tablet TAKE 2 TABLETS BY MOUTH 2 TIMES DAILY  . lamoTRIgine (LAMICTAL) 200 MG tablet Take 200 mg by mouth 2 (two) times daily.   [name] lidocaine (LIDODERM) 5 % Place 1 patch onto the skin daily as needed (for knee pain). Remove & Discard patch within 12 hours or as directed by MD  . losartan (COZAAR) 25 MG tablet Take 1 tablet (25 mg total) by mouth 2 (two) times daily.  . Lurasidone HCl (LATUDA) 120 MG TABS Take 1 tablet by mouth daily.  [name] LYRICA 300 MG capsule Take 1 tablet by mouth Twice daily.  [name] MAGNESIUM PO Take 1 tablet by mouth daily.  . Meloxicam (MOBIC PO) Take 7.5 mg by mouth every 12 (twelve) hours.   . metFORMIN (GLUCOPHAGE) 500 MG tablet TAKE 1 TABLET BY MOUTH TWICE DAILY WITH MEALS  . Multiple Vitamin (MULTIVITAMIN) tablet Take 1 tablet by  mouth daily.    . norethindrone-ethinyl estradiol-iron (MICROGESTIN FE 1.5/30) 1.5-30 MG-MCG tablet Take 1 tablet by mouth at bedtime. Takes continuously  . potassium chloride SA (KLOR-CON M20) 20 MEQ tablet Take 1 tablet (20 mEq total) by mouth daily.  . Zinc 50 MG CAPS Take 50 mg by mouth daily.  . [DISCONTINUED] albuterol (PROVENTIL HFA;VENTOLIN HFA) 108 (90 Base) MCG/ACT inhaler Inhale 2 puffs into the lungs every 6 (six) hours as needed for wheezing or shortness of breath.  . [DISCONTINUED] carbamazepine (TEGRETOL) 200 MG tablet Take 200 mg by mouth at bedtime. Take 1 tablet in the morning and 2 tablets at night  . [DISCONTINUED] methylphenidate (RITALIN) 10 MG tablet 5-20 mg. Takes 20 mg morning and noon, and 5-10 mg at 4pm   No facility-administered encounter medications on file as of 11/24/2015.     Activities of Daily Living In your present state of health, do you have any difficulty performing the following activities: 11/24/2015 05/04/2015  Hearing? N N  Vision? N N  Difficulty concentrating or making decisions? N N  Walking or climbing stairs? Y N  Dressing or bathing? Y N  Doing errands, shopping? Tempie Donning  Preparing Food and eating ? Y -  Using the Toilet? Y -  In the past six months, have you accidently leaked urine? Y -  Do you have problems with loss of bowel control? N -  Managing your Medications? N -  Managing your Finances? N -  Housekeeping or managing your Housekeeping? N -  Some recent data might be hidden    Patient Care Team: Lucretia Kern, DO as PCP - General (Family Medicine)    Assessment:      Exercise Activities and Dietary recommendations Current Exercise Habits: Structured exercise class, Type of exercise: strength training/weights (cardio ), Time (Minutes): 60, Frequency (Times/Week): 3, Weekly Exercise (Minutes/Week): 180, Intensity: Mild  Ms Mclauglin goal is to continue to exercise;  Goals    None     Fall Risk Fall Risk  11/24/2015  Falls  in the past year? Yes  Number falls in past yr: 2 or more  Risk for fall due to : Impaired balance/gait  Risk for fall due to (comments): keep exercising  Follow up Education provided   Depression Screen No flowsheet data found.  See note; deferred to psych and ongoing counseling   Cognitive Function MMSE - Mini Mental State Exam 11/24/2015  Not completed: (No Data)   Does have slight memory/ recall issues; due to meds and depression.       Immunization History  Administered Date(s) Administered  . Tdap 03/18/2014    Screening Tests Health Maintenance  Topic Date Due  . PNEUMOCOCCAL POLYSACCHARIDE VACCINE (1) 11/23/2016 (Originally 11/20/1972)  . INFLUENZA VACCINE  11/23/2016 (Originally 08/30/2015)  . OPHTHALMOLOGY EXAM  11/28/2016 (Originally 03/19/2015)  . HEMOGLOBIN A1C  05/24/2016  . PAP SMEAR  08/29/2016  . FOOT EXAM  11/23/2016  . TETANUS/TDAP  03/18/2024  . HIV Screening  Completed      PLAN:  Will get mammogram; will call the breast center   During the course of the visit the patient was educated and counseled about the following appropriate screening and preventive services:   Vaccines to include Pneumoccal, Influenza, Hepatitis B, Td, Zostavax, HCV/ declines flu and pneumonia   Electrocardiogram  Cardiovascular Disease  Colorectal cancer screening ,65   Bone density screening <65  Diabetes screening   laucoma screening  Mammography/ to schedule her mammogram; will call and make apt  Nutrition counseling   Patient Instructions (the written plan) was given to the patient.   Wynetta Fines, RN  11/24/2015

## 2015-11-24 NOTE — Progress Notes (Signed)
HPI:  [name] is a pleasant 45 yo with an extensive PMH significant for morbid obesity, obesity hypoventilation syndrome, possible asthma, HTN, HLD, seasonal allergies, OSA, cardiomyopathy, anxiety, mild diabetes, bipolar disorder, cor pulmonale, hx CHF followed by multiple specialists including pulmonology, cardiology and psychiatry. She is here for a follow-up visit with me and is seeing Wynetta Fines for her Medicare wellness visit today. She reports she is doing quite well. She actually started exercising and is doing 2-3 hours of aerobic exercise per week. Her personal trainer and physical therapist helped her to initiate this program. She fell against a wall in the gym the other day and suffered a minor bruise of the left knee, but does not feel that this needs further evaluation. She is due for lab work. She has been taking the cholesterol medication daily for the last several months. She uses her albuterol rarely, but is in need of a refill and requests this today. She refuses all vaccines today. She reports she did have her eye exam with [facility]. No CP, SOB, swelling, frequent alb use.  ROS: See pertinent positives and negatives per HPI.  Past Medical History:  Diagnosis Date  . Abrasion of breast 06/21/2011   left  . Acute systolic congestive heart failure ([place])   . Agoraphobia   . Allergy   . Anxiety    panic attacks  . Arthritis    osteoarthritis bilal. knees  . Asthma    no per PFT 7/13; reports does not have asthma  . Bipolar disorder ([place])   . Carpal tunnel syndrome of left wrist 05/2011   Being evaluated for MS  . Cor pulmonale ([place])   . Depression   . History of pleural effusion   . Hyperlipidemia   . Hypertension   . Hypoxemia    history of - no home O2  . IBS (irritable bowel syndrome)   . Morbid obesity ([place])   . Obesity hypoventilation syndrome ([place])   . Prediabetes   . Seasonal allergies    current runny nose  . Seizures ([place])    febrile  seizure x 1 as a child. Several times  . Sleep apnea sleep study 07/02/2010   uses CPAP nightly  . Spinal stenosis in cervical region   . Urinary incontinence     Past Surgical History:  Procedure Laterality Date  . CARPAL TUNNEL RELEASE  06/27/2011   Procedure: CARPAL TUNNEL RELEASE;  Surgeon: Wynonia Sours, MD;  Location: [place];  Service: Orthopedics;  Laterality: Left;  . CARPAL TUNNEL RELEASE  12/19/2011   Procedure: CARPAL TUNNEL RELEASE;  Surgeon: Wynonia Sours, MD;  Location: [place];  Service: Orthopedics;  Laterality: Right;  . MASS EXCISION  08/07/2010   right index  . POSTERIOR CERVICAL FUSION/FORAMINOTOMY  09/11/2011   Procedure: POSTERIOR CERVICAL FUSION/FORAMINOTOMY LEVEL 1;  Surgeon: Erline Levine, MD;  Location: [place] [facility];  Service: Neurosurgery;  Laterality: N/A;  Cervical Three-Four Posteior Cervical Fusion and Decompression.  [name] TRIGGER FINGER RELEASE  12/19/2011   Procedure: RELEASE TRIGGER FINGER/A-1 PULLEY;  Surgeon: Wynonia Sours, MD;  Location: [facility];  Service: Orthopedics;  Laterality: Right;  . TRIGGER FINGER RELEASE Left 12/31/2012   Procedure: RELEASE A-1 PULLEY LEFT THUMB;  Surgeon: Wynonia Sours, MD;  Location: [place];  Service: Orthopedics;  Laterality: Left;  . WISDOM TOOTH EXTRACTION      Family History  Problem Relation Age of Onset  .  Asthma Mother   . Allergic rhinitis Mother   . Multiple sclerosis Mother   . Asthma Maternal Grandmother     Social History   Social History  . Marital status: Married    Spouse name: N/A  . Number of children: N/A  . Years of education: N/A   Occupational History  . currently unemployed    Social History Main Topics  . Smoking status: Never Smoker  . Smokeless tobacco: Never Used  . Alcohol use Yes     Comment: rare  . Drug use: No  . Sexual activity: Not Asked   Other Topics Concern  . None   Social History Narrative   Lives in  [place]           Current Outpatient Prescriptions:  .  acetaminophen (TYLENOL) 500 MG tablet, Take 2,000 mg by mouth 2 (two) times daily. Taking every day per patient, Disp: , Rfl:  .  albuterol (PROVENTIL HFA;VENTOLIN HFA) 108 (90 Base) MCG/ACT inhaler, Inhale 2 puffs into the lungs every 6 (six) hours as needed for wheezing or shortness of breath., Disp: 1 Inhaler, Rfl: 3 .  ALPRAZolam (XANAX) 1 MG tablet, Take 1 mg by mouth 3 (three) times daily. , Disp: , Rfl:  .  Ascorbic Acid (VITAMIN C) 500 MG tablet, Take 1,000 mg by mouth daily. , Disp: , Rfl:  .  aspirin 81 MG tablet, Take 81 mg by mouth daily. , Disp: , Rfl:  .  atorvastatin (LIPITOR) 20 MG tablet, Take 1 tablet (20 mg total) by mouth daily at 6 PM., Disp: 30 tablet, Rfl: 11 .  b complex vitamins tablet, Take 1 tablet by mouth daily., Disp: , Rfl:  .  Cholecalciferol (VITAMIN D3) 5000 UNITS TABS, Take 3 tablets by mouth daily. , Disp: , Rfl:  .  Chromium Picolinate 500 MCG CAPS, Take 1 capsule by mouth daily., Disp: , Rfl:  .  clonazePAM (KLONOPIN) 1 MG tablet, Take 1 tablet (1 mg total) by mouth 3 (three) times daily., Disp: 30 tablet, Rfl: 0 .  furosemide (LASIX) 40 MG tablet, TAKE 2 TABLETS BY MOUTH 2 TIMES DAILY, Disp: 360 tablet, Rfl: 3 .  lamoTRIgine (LAMICTAL) 200 MG tablet, Take 200 mg by mouth 2 (two) times daily. , Disp: , Rfl:  .  lidocaine (LIDODERM) 5 %, Place 1 patch onto the skin daily as needed (for knee pain). Remove & Discard patch within 12 hours or as directed by MD, Disp: 30 patch, Rfl: 0 .  losartan (COZAAR) 25 MG tablet, Take 1 tablet (25 mg total) by mouth 2 (two) times daily., Disp: 180 tablet, Rfl: 3 .  Lurasidone HCl (LATUDA) 120 MG TABS, Take 1 tablet by mouth daily., Disp: , Rfl:  .  LYRICA 300 MG capsule, Take 1 tablet by mouth Twice daily., Disp: , Rfl:  .  MAGNESIUM PO, Take 1 tablet by mouth daily., Disp: , Rfl:  .  Meloxicam (MOBIC PO), Take 7.5 mg by mouth every 12 (twelve) hours. , Disp: ,  Rfl:  .  metFORMIN (GLUCOPHAGE) 500 MG tablet, TAKE 1 TABLET BY MOUTH TWICE DAILY WITH MEALS, Disp: 180 tablet, Rfl: 1 .  Multiple Vitamin (MULTIVITAMIN) tablet, Take 1 tablet by mouth daily.  , Disp: , Rfl:  .  norethindrone-ethinyl estradiol-iron (MICROGESTIN FE 1.5/30) 1.5-30 MG-MCG tablet, Take 1 tablet by mouth at bedtime. Takes continuously, Disp: , Rfl:  .  potassium chloride SA (KLOR-CON M20) 20 MEQ tablet, Take 1 tablet (20 mEq total) by  mouth daily., Disp: 90 tablet, Rfl: 2 .  Zinc 50 MG CAPS, Take 50 mg by mouth daily., Disp: , Rfl:   EXAM:  Vitals:   11/24/15 1402  BP: 126/84  Pulse: 85  Temp: 98 F (36.7 C)    Body mass index is 60.07 kg/m. Body mass index is 60.07 kg/m.  GENERAL: vitals reviewed and listed above, alert, oriented, appears well hydrated and in no acute distress  HEENT: atraumatic, conjunttiva clear, no obvious abnormalities on inspection of external nose and ears  NECK: no obvious masses on inspection  LUNGS: clear to auscultation bilaterally, no wheezes, rales or rhonchi, good air movement  CV: HRRR, no peripheral edema  MS: moves all extremities without noticeable abnormality  PSYCH: pleasant and cooperative, no obvious depression or anxiety  Foot exam done  ASSESSMENT AND PLAN:  Discussed the following assessment and plan:  Type 2 diabetes mellitus with complication, without long-term current use of insulin (HCC) - Plan: Hemoglobin A1c  Essential hypertension - Plan: Basic metabolic panel, CBC (no diff)  Severe obesity (BMI >= 40) (HCC)  Hyperlipidemia, unspecified hyperlipidemia type - Plan: Lipid Panel  Uncomplicated asthma, unspecified asthma severity, unspecified whether persistent  Obesity hypoventilation syndrome (HCC)  Cardiomyopathy, unspecified type (HCC)  Chronic systolic heart failure   Bipolar affective disorder, remission status unspecified ([place])  -Knee looks good with very very small bruise, advised that she do  follow up if pain persists here -Congratulated on lifestyle changes encouraged to continue, recurrence a healthy diet and regular exercise -Advised assistant to obtain eye report -Foot exam done -Labs pending -Refill provided -Patient advised to return or notify a doctor immediately if symptoms worsen or persist or new concerns arise.  Patient Instructions  BEFORE YOU LEAVE: -labs -Medicare exam with Manuela Schwartz before you leave today -follow up: 3-4 months  We have ordered labs or studies at this visit. It can take up to 1-2 weeks for results and processing. IF results require follow up or explanation, we will call you with instructions. Clinically stable results will be released to your [facility]. If you have not heard from [place] or cannot find your results in [facility] in 2 weeks please contact our office at [phone].  If you are not yet signed up for [facility], please consider signing up.    We recommend the following healthy lifestyle for LIFE: 1) Small portions.   Tip: eat off of a salad plate instead of a dinner plate.  Tip: It is ok to feel hungry after a meal  Tip: if you need more or a snack choose fruits, veggies and/or a handful of nuts or seeds.  2) Eat a healthy clean diet.  * Tip: Avoid (less then 1 serving per week): processed foods, sweets, sweetened drinks, white starches (rice, flour, bread, potatoes, pasta, etc), red meat, fast foods, butter  *Tip: CHOOSE instead   * 5-9 servings per day of fresh or frozen fruits and vegetables (but not corn, potatoes, bananas, canned or dried fruit)   *nuts and seeds, beans   *olives and olive oil   *small portions of lean meats such as fish and white chicken    *small portions of whole grains  3)Get at least 150 minutes of sweaty aerobic exercise per week.  4)Reduce stress - consider counseling, meditation and relaxation to balance other aspects of your life.         Colin Benton R., DO

## 2015-11-25 LAB — CBC
HEMATOCRIT: 47.6 % — AB (ref 36.0–46.0)
HEMOGLOBIN: 15.8 g/dL — AB (ref 12.0–15.0)
MCHC: 33.2 g/dL (ref 30.0–36.0)
MCV: 95.7 fl (ref 78.0–100.0)
PLATELETS: 204 10*3/uL (ref 150.0–400.0)
RBC: 4.97 Mil/uL (ref 3.87–5.11)
RDW: 13.5 % (ref 11.5–15.5)
WBC: 9.2 10*3/uL (ref 4.0–10.5)

## 2015-11-25 LAB — BASIC METABOLIC PANEL
BUN: 17 mg/dL (ref 6–23)
CHLORIDE: 101 meq/L (ref 96–112)
CO2: 32 mEq/L (ref 19–32)
Calcium: 9.6 mg/dL (ref 8.4–10.5)
Creatinine, Ser: 1 mg/dL (ref 0.40–1.20)
GFR: 63.44 mL/min (ref >60.00)
Glucose, Bld: 102 mg/dL — ABNORMAL HIGH (ref 70–99)
POTASSIUM: 4.4 meq/L (ref 3.5–5.1)
SODIUM: 142 meq/L (ref 135–145)

## 2015-11-25 LAB — LIPID PANEL
CHOL/HDL RATIO: 3
Cholesterol: 165 mg/dL (ref 0–200)
HDL: 61 mg/dL (ref >39.00)
LDL Cholesterol: 77 mg/dL (ref 0–99)
NONHDL: 103.56
TRIGLYCERIDES: 131 mg/dL (ref 0.0–149.0)
VLDL: 26.2 mg/dL (ref 0.0–40.0)

## 2015-11-25 LAB — HEMOGLOBIN A1C: Hgb A1c MFr Bld: 6.7 % — ABNORMAL HIGH (ref 4.6–6.5)

## 2015-11-28 NOTE — Progress Notes (Signed)
Jessica Gadbois R., DO  

## 2015-11-29 ENCOUNTER — Ambulatory Visit (INDEPENDENT_AMBULATORY_CARE_PROVIDER_SITE_OTHER): Payer: PRIVATE HEALTH INSURANCE | Admitting: Psychology

## 2015-11-29 DIAGNOSIS — F319 Bipolar disorder, unspecified: Secondary | ICD-10-CM

## 2015-12-01 ENCOUNTER — Encounter: Payer: Self-pay | Admitting: Family Medicine

## 2015-12-06 ENCOUNTER — Ambulatory Visit (INDEPENDENT_AMBULATORY_CARE_PROVIDER_SITE_OTHER): Payer: PRIVATE HEALTH INSURANCE | Admitting: Psychology

## 2015-12-06 DIAGNOSIS — F319 Bipolar disorder, unspecified: Secondary | ICD-10-CM

## 2015-12-13 ENCOUNTER — Ambulatory Visit (INDEPENDENT_AMBULATORY_CARE_PROVIDER_SITE_OTHER): Payer: PRIVATE HEALTH INSURANCE | Admitting: Psychology

## 2015-12-13 DIAGNOSIS — F319 Bipolar disorder, unspecified: Secondary | ICD-10-CM

## 2015-12-20 ENCOUNTER — Ambulatory Visit (INDEPENDENT_AMBULATORY_CARE_PROVIDER_SITE_OTHER): Payer: Medicare Other | Admitting: Psychology

## 2015-12-20 DIAGNOSIS — F319 Bipolar disorder, unspecified: Secondary | ICD-10-CM

## 2015-12-27 ENCOUNTER — Ambulatory Visit (INDEPENDENT_AMBULATORY_CARE_PROVIDER_SITE_OTHER): Payer: Medicare Other | Admitting: Psychology

## 2015-12-27 DIAGNOSIS — F319 Bipolar disorder, unspecified: Secondary | ICD-10-CM

## 2016-01-03 ENCOUNTER — Ambulatory Visit (INDEPENDENT_AMBULATORY_CARE_PROVIDER_SITE_OTHER): Payer: PRIVATE HEALTH INSURANCE | Admitting: Psychology

## 2016-01-03 DIAGNOSIS — F319 Bipolar disorder, unspecified: Secondary | ICD-10-CM

## 2016-01-13 ENCOUNTER — Telehealth: Payer: Self-pay | Admitting: Cardiology

## 2016-01-13 NOTE — Telephone Encounter (Signed)
New message  Pt's husband is calling to confirm receipt of renewal form for handicap placard  Please call back and advise

## 2016-01-16 NOTE — Telephone Encounter (Signed)
Spoke with pt letting her know we have not received handicap placard, but I do have copy and I will fill it out and have Dr Percival Spanish to sign and mailed it out to her.

## 2016-01-17 ENCOUNTER — Ambulatory Visit (INDEPENDENT_AMBULATORY_CARE_PROVIDER_SITE_OTHER): Payer: PRIVATE HEALTH INSURANCE | Admitting: Psychology

## 2016-01-17 DIAGNOSIS — F319 Bipolar disorder, unspecified: Secondary | ICD-10-CM

## 2016-01-18 ENCOUNTER — Telehealth: Payer: Self-pay | Admitting: Cardiology

## 2016-01-18 NOTE — Telephone Encounter (Signed)
°  New Prob   Calling to confirm handicap renewal information that was mailed on 01/06/16 was received and that no issues have come up with filling out form. Please call.  Husband states current handicap exprires in January 2018.

## 2016-01-18 NOTE — Telephone Encounter (Signed)
Spoke with husband. He states they mailed DMV handicap placard info and they checked the tracking on it and it should've been received 12/13. Apologized that it has not been received, per Nya's note 12/18  Advised husband that the form can be completed and they be called when ready and can pick it up, which husband agrees to.   Will route to Nya to contact husband at # listed in this telephone note when form has been completed.

## 2016-01-19 NOTE — Telephone Encounter (Signed)
Spoke with pt and her husband letting them know DMV form at front desk for pick up

## 2016-01-24 ENCOUNTER — Ambulatory Visit (INDEPENDENT_AMBULATORY_CARE_PROVIDER_SITE_OTHER): Payer: PRIVATE HEALTH INSURANCE | Admitting: Psychology

## 2016-01-24 DIAGNOSIS — F319 Bipolar disorder, unspecified: Secondary | ICD-10-CM

## 2016-01-31 ENCOUNTER — Ambulatory Visit: Payer: PRIVATE HEALTH INSURANCE | Admitting: Psychology

## 2016-02-07 ENCOUNTER — Ambulatory Visit (INDEPENDENT_AMBULATORY_CARE_PROVIDER_SITE_OTHER): Payer: PRIVATE HEALTH INSURANCE | Admitting: Psychology

## 2016-02-07 DIAGNOSIS — F319 Bipolar disorder, unspecified: Secondary | ICD-10-CM

## 2016-02-14 ENCOUNTER — Ambulatory Visit (INDEPENDENT_AMBULATORY_CARE_PROVIDER_SITE_OTHER): Payer: PRIVATE HEALTH INSURANCE | Admitting: Psychology

## 2016-02-14 DIAGNOSIS — F319 Bipolar disorder, unspecified: Secondary | ICD-10-CM | POA: Diagnosis not present

## 2016-02-21 ENCOUNTER — Telehealth: Payer: Self-pay

## 2016-02-21 ENCOUNTER — Ambulatory Visit (INDEPENDENT_AMBULATORY_CARE_PROVIDER_SITE_OTHER): Payer: PRIVATE HEALTH INSURANCE | Admitting: Psychology

## 2016-02-21 DIAGNOSIS — F319 Bipolar disorder, unspecified: Secondary | ICD-10-CM | POA: Diagnosis not present

## 2016-02-21 NOTE — Telephone Encounter (Signed)
Left vm to bring sd card to appointment tomorrow

## 2016-02-22 ENCOUNTER — Ambulatory Visit (INDEPENDENT_AMBULATORY_CARE_PROVIDER_SITE_OTHER): Payer: 59 | Admitting: Acute Care

## 2016-02-22 ENCOUNTER — Encounter: Payer: Self-pay | Admitting: Acute Care

## 2016-02-22 DIAGNOSIS — E669 Obesity, unspecified: Secondary | ICD-10-CM

## 2016-02-22 DIAGNOSIS — Z9989 Dependence on other enabling machines and devices: Secondary | ICD-10-CM

## 2016-02-22 DIAGNOSIS — G4733 Obstructive sleep apnea (adult) (pediatric): Secondary | ICD-10-CM | POA: Diagnosis not present

## 2016-02-22 NOTE — Patient Instructions (Addendum)
It is nice to meet you. We will place an order for oxygen concentrator today. Continue on CPAP at bedtime. You appear to be benefiting from the treatment Goal is to wear for at least 6 hours each night for maximal clinical benefit. Continue to work on weight loss, as the link between excess weight  and sleep apnea is well established. Congratulations on your weight loss.  Do not drive if sleepy. Follow up with Dr. Elsworth Soho  In one year with download  or before as needed.  Please contact office for sooner follow up if symptoms do not improve or worsen or seek emergency care  Remember to clean your machine once weekly.

## 2016-02-22 NOTE — Addendum Note (Signed)
Addended by: Jannette Spanner on: 02/22/2016 04:22 PM   Modules accepted: Orders

## 2016-02-22 NOTE — Assessment & Plan Note (Signed)
Congratulations on your weight loss. Continue your exercising daily Diet modification as able

## 2016-02-22 NOTE — Assessment & Plan Note (Signed)
100% Compliance on CPAP 15 cm water with 3 L oxygen blended in AHI: 2.5 Significant leak but has new mask Plan: We will place an order for oxygen concentrator today. Continue on CPAP at bedtime. You appear to be benefiting from the treatment Goal is to wear for at least 6 hours each night for maximal clinical benefit. Continue to work on weight loss, as the link between excess weight  and sleep apnea is well established. Congratulations on your weight loss.  Do not drive if sleepy. Follow up with Dr. Elsworth Soho  In one year with download  or before as needed.  Please contact office for sooner follow up if symptoms do not improve or worsen or seek emergency care  Remember to clean your machine once weekly.

## 2016-02-22 NOTE — Progress Notes (Signed)
History of Present Illness [name] is a 46 y.o. female with HPI  never smoker with morbid obesity for FU of obstructive sleep apnea on CPAP 15 cm, obesity hypoventilation & 'asthma'  She also has DM-2 & bipolar -Sees Dr Toy Care every 8 wks & Cheryln Manly every week   Prior dx with asthma has been tx in past with advair , theophylline.  Previously seen by allergist, recommended to have allergy vaccines . Declined.  allegra and nasonex added 4/13 for nasal symptoms but c/o increased asthma attacks after addition incl acute OV in 6/13 - changed regimen to Zyrtec +Chlortrimeton 6/13  UDS in chart review +marijuana 2012.  Significant tests/ events  Admitted 04/2010 for worsening dyspnea & wt gain .  ABG was 7.40/49/63 on 4 L [place]. Echo showed EF 50% with mild decrease in systolic function. BNP > 500 & CXR showed a rt pleural effusion. CT angio neg for PE, She was diuresed > 50 lbs, discharged on O2 ([facility]) .   PSG in '02 showed severe OSA with AHI 40/h, lowest desatn to 52% correctd by 8 cm H2O . Her wt was 325 lbs then.  CPAP titration study >> required 10-16cm CPAP, higher pressure when supine/ REM sleep.  Download 07/2010 >>Good control of events on CPAP 13 cm - changed to this pressure.  12/2011 Download -CPAP 13 cm very effective  O2 dc'd 10/12   PFTs 06/2011 - no obstruction, mild restriction, FVC 78%, DLCO 64%    02/22/2016 Follow up for CPAP Compliance/ OSA  Pt. Presents for CPAP follow up. She is compliant with her CPAP. She sleeps with her device about 8 hours every night. She states that she has less daytime sleepiness. She has no complaints. She has a new full face mask that she really likes. She continues to work on weight loss.She is restarting her exercise regimen in February to work on weight loss.She has lost 12 pounds.She appears to be doing well.She denies chest pain, orthopnea, edema or fever.  Down Load:12/25/ 17 -02/21/2016 CPAP Pressure 15 cm H2O with 3  L oxygen blended in C-Flex + setting: 3 Compliance 30 of 30 days: 100% Average Daily Use 10 Hrs 19 minutes > 4 hours 100% Average leakage per day 4 hours 12 minutes ( She has recently started using a new full face  mask). Average AHI: 2.5  Pt. Has no complaints.  Past medical hx Past Medical History:  Diagnosis Date  . Abrasion of breast 06/21/2011   left  . Acute systolic congestive heart failure ([place])   . Agoraphobia   . Allergy   . Anxiety    panic attacks  . Arthritis    osteoarthritis bilal. knees  . Asthma    no per PFT 7/13; reports does not have asthma  . Bipolar disorder ([place])   . Carpal tunnel syndrome of left wrist 05/2011   Being evaluated for MS  . Cor pulmonale ([place])   . Depression   . History of pleural effusion   . Hyperlipidemia   . Hypertension   . Hypoxemia    history of - no home O2  . IBS (irritable bowel syndrome)   . Morbid obesity ([place])   . Obesity hypoventilation syndrome ([place])   . Prediabetes   . Seasonal allergies    current runny nose  . Seizures ([place])    febrile seizure x 1 as a child. Several times  . Sleep apnea sleep study 07/02/2010   uses CPAP nightly  .  Spinal stenosis in cervical region   . Urinary incontinence      Past surgical hx, Family hx, Social hx all reviewed.  Current Outpatient Prescriptions on File Prior to Visit  Medication Sig  . acetaminophen (TYLENOL) 500 MG tablet Take 2,000 mg by mouth 2 (two) times daily. Taking every day per patient  . albuterol (PROVENTIL HFA;VENTOLIN HFA) 108 (90 Base) MCG/ACT inhaler Inhale 2 puffs into the lungs every 6 (six) hours as needed for wheezing or shortness of breath.  . ALPRAZolam (XANAX) 1 MG tablet Take 1 mg by mouth 3 (three) times daily.   . Ascorbic Acid (VITAMIN C) 500 MG tablet Take 1,000 mg by mouth daily.   [name] aspirin 81 MG tablet Take 81 mg by mouth daily.   [name] atorvastatin (LIPITOR) 20 MG tablet Take 1 tablet (20 mg total) by mouth daily at 6 PM.  . b complex vitamins  tablet Take 1 tablet by mouth daily.  . Cholecalciferol (VITAMIN D3) 5000 UNITS TABS Take 3 tablets by mouth daily.   . Chromium Picolinate 500 MCG CAPS Take 1 capsule by mouth daily.  . clonazePAM (KLONOPIN) 1 MG tablet Take 1 tablet (1 mg total) by mouth 3 (three) times daily.  . furosemide (LASIX) 40 MG tablet TAKE 2 TABLETS BY MOUTH 2 TIMES DAILY  . lamoTRIgine (LAMICTAL) 200 MG tablet Take 200 mg by mouth 2 (two) times daily.   [name] lidocaine (LIDODERM) 5 % Place 1 patch onto the skin daily as needed (for knee pain). Remove & Discard patch within 12 hours or as directed by MD  . losartan (COZAAR) 25 MG tablet Take 1 tablet (25 mg total) by mouth 2 (two) times daily.  . Lurasidone HCl (LATUDA) 120 MG TABS Take 1 tablet by mouth daily.  [name] LYRICA 300 MG capsule Take 1 tablet by mouth Twice daily.  [name] MAGNESIUM PO Take 1 tablet by mouth daily.  . Meloxicam (MOBIC PO) Take 7.5 mg by mouth every 12 (twelve) hours.   . metFORMIN (GLUCOPHAGE) 500 MG tablet TAKE 1 TABLET BY MOUTH TWICE DAILY WITH MEALS  . Multiple Vitamin (MULTIVITAMIN) tablet Take 1 tablet by mouth daily.    . norethindrone-ethinyl estradiol-iron (MICROGESTIN FE 1.5/30) 1.5-30 MG-MCG tablet Take 1 tablet by mouth at bedtime. Takes continuously  . potassium chloride SA (KLOR-CON M20) 20 MEQ tablet Take 1 tablet (20 mEq total) by mouth daily.  . Zinc 50 MG CAPS Take 50 mg by mouth daily.   No current facility-administered medications on file prior to visit.      Allergies  Allergen Reactions  . Savella [Milnacipran Hcl]     mania    Review Of Systems:  Constitutional:   +  weight loss, No  night sweats,  Fevers, chills, fatigue, or  lassitude.  HEENT:   No headaches,  Difficulty swallowing,  Tooth/dental problems, or  Sore throat,                No sneezing, itching, ear ache, nasal congestion, post nasal drip,   CV:  No chest pain,  Orthopnea, PND, swelling in lower extremities, anasarca, dizziness, palpitations, syncope.     GI  No heartburn, indigestion, abdominal pain, nausea, vomiting, diarrhea, change in bowel habits, loss of appetite, bloody stools.   Resp: No shortness of breath with exertion or at rest.  No excess mucus, no productive cough,  No non-productive cough,  No coughing up of blood.  No change in color of mucus.  No wheezing.  No chest wall deformity  Skin: no rash or lesions.  GU: no dysuria, change in color of urine, no urgency or frequency.  No flank pain, no hematuria   MS:  No joint pain or swelling.  No decreased range of motion.  No back pain.  Psych:  No change in mood or affect. No depression or anxiety.  No memory loss.   Vital Signs BP 124/62 (BP Location: Left Arm, Cuff Size: Large)   Pulse 79   Ht 5\' 5"  (1.651 m)   Wt (!) 357 lb (161.9 kg)   SpO2 93%   BMI 59.41 kg/m    Physical Exam:  General- No distress,  A&Ox3, obese female ENT: No sinus tenderness, TM clear, pale nasal mucosa, no oral exudate,no post nasal drip, no LAN Cardiac: S1, S2, regular rate and rhythm, no murmur Chest: No wheeze/ rales/ dullness; no accessory muscle use, no nasal flaring, no sternal retractions Abd.: Soft Non-tender, obese Ext: No clubbing cyanosis, edema Neuro:  normal strength Skin: No rashes, warm and dry Psych: normal mood and behavior, flat affect   Assessment/Plan  OSA on CPAP 100% Compliance on CPAP 15 cm water with 3 L oxygen blended in AHI: 2.5 Significant leak but has new mask Plan: We will place an order for oxygen concentrator today. Continue on CPAP at bedtime. You appear to be benefiting from the treatment Goal is to wear for at least 6 hours each night for maximal clinical benefit. Continue to work on weight loss, as the link between excess weight  and sleep apnea is well established. Congratulations on your weight loss.  Do not drive if sleepy. Follow up with Dr. Elsworth Soho  In one year with download  or before as needed.  Please contact office for sooner follow  up if symptoms do not improve or worsen or seek emergency care  Remember to clean your machine once weekly.    Obesity Congratulations on your weight loss. Continue your exercising daily Diet modification as able    Magdalen Spatz, NP 02/22/2016  4:15 PM

## 2016-02-28 ENCOUNTER — Ambulatory Visit (INDEPENDENT_AMBULATORY_CARE_PROVIDER_SITE_OTHER): Payer: PRIVATE HEALTH INSURANCE | Admitting: Psychology

## 2016-02-28 ENCOUNTER — Telehealth: Payer: Self-pay | Admitting: Pulmonary Disease

## 2016-02-28 DIAGNOSIS — F319 Bipolar disorder, unspecified: Secondary | ICD-10-CM

## 2016-02-28 NOTE — Telephone Encounter (Signed)
lmomtcb x1 

## 2016-02-28 NOTE — Progress Notes (Signed)
Reviewed & agree with plan  

## 2016-03-01 NOTE — Telephone Encounter (Signed)
Spoke with pt. She is wanting to have a portable concentrator to take with her on a trip she is taking. Pt contacted [employer] and they require 5-10 day notice when things like this are needed. The pt missed this time frame. She is going to just take her home concentrator with her on this trip. Nothing further was needed at this time per the pt.

## 2016-03-06 ENCOUNTER — Ambulatory Visit: Payer: PRIVATE HEALTH INSURANCE | Admitting: Psychology

## 2016-03-13 ENCOUNTER — Ambulatory Visit (INDEPENDENT_AMBULATORY_CARE_PROVIDER_SITE_OTHER): Payer: PRIVATE HEALTH INSURANCE | Admitting: Psychology

## 2016-03-13 DIAGNOSIS — F319 Bipolar disorder, unspecified: Secondary | ICD-10-CM | POA: Diagnosis not present

## 2016-03-20 ENCOUNTER — Ambulatory Visit (INDEPENDENT_AMBULATORY_CARE_PROVIDER_SITE_OTHER): Payer: PRIVATE HEALTH INSURANCE | Admitting: Psychology

## 2016-03-20 DIAGNOSIS — F319 Bipolar disorder, unspecified: Secondary | ICD-10-CM | POA: Diagnosis not present

## 2016-03-27 ENCOUNTER — Ambulatory Visit (INDEPENDENT_AMBULATORY_CARE_PROVIDER_SITE_OTHER): Payer: PRIVATE HEALTH INSURANCE | Admitting: Psychology

## 2016-03-27 DIAGNOSIS — F319 Bipolar disorder, unspecified: Secondary | ICD-10-CM | POA: Diagnosis not present

## 2016-04-03 ENCOUNTER — Ambulatory Visit (INDEPENDENT_AMBULATORY_CARE_PROVIDER_SITE_OTHER): Payer: PRIVATE HEALTH INSURANCE | Admitting: Psychology

## 2016-04-03 DIAGNOSIS — F319 Bipolar disorder, unspecified: Secondary | ICD-10-CM | POA: Diagnosis not present

## 2016-04-10 ENCOUNTER — Ambulatory Visit (INDEPENDENT_AMBULATORY_CARE_PROVIDER_SITE_OTHER): Payer: PRIVATE HEALTH INSURANCE | Admitting: Psychology

## 2016-04-10 DIAGNOSIS — F319 Bipolar disorder, unspecified: Secondary | ICD-10-CM | POA: Diagnosis not present

## 2016-04-17 ENCOUNTER — Ambulatory Visit (INDEPENDENT_AMBULATORY_CARE_PROVIDER_SITE_OTHER): Payer: PRIVATE HEALTH INSURANCE | Admitting: Psychology

## 2016-04-17 DIAGNOSIS — F319 Bipolar disorder, unspecified: Secondary | ICD-10-CM | POA: Diagnosis not present

## 2016-04-24 ENCOUNTER — Ambulatory Visit (INDEPENDENT_AMBULATORY_CARE_PROVIDER_SITE_OTHER): Payer: PRIVATE HEALTH INSURANCE | Admitting: Psychology

## 2016-04-24 DIAGNOSIS — F319 Bipolar disorder, unspecified: Secondary | ICD-10-CM | POA: Diagnosis not present

## 2016-05-01 ENCOUNTER — Ambulatory Visit: Payer: PRIVATE HEALTH INSURANCE | Admitting: Psychology

## 2016-05-08 ENCOUNTER — Ambulatory Visit (INDEPENDENT_AMBULATORY_CARE_PROVIDER_SITE_OTHER): Payer: PRIVATE HEALTH INSURANCE | Admitting: Psychology

## 2016-05-08 DIAGNOSIS — F319 Bipolar disorder, unspecified: Secondary | ICD-10-CM | POA: Diagnosis not present

## 2016-05-15 ENCOUNTER — Ambulatory Visit (INDEPENDENT_AMBULATORY_CARE_PROVIDER_SITE_OTHER): Payer: PRIVATE HEALTH INSURANCE | Admitting: Psychology

## 2016-05-15 DIAGNOSIS — F319 Bipolar disorder, unspecified: Secondary | ICD-10-CM | POA: Diagnosis not present

## 2016-05-22 ENCOUNTER — Ambulatory Visit (INDEPENDENT_AMBULATORY_CARE_PROVIDER_SITE_OTHER): Payer: PRIVATE HEALTH INSURANCE | Admitting: Psychology

## 2016-05-22 DIAGNOSIS — F319 Bipolar disorder, unspecified: Secondary | ICD-10-CM

## 2016-05-29 ENCOUNTER — Ambulatory Visit (INDEPENDENT_AMBULATORY_CARE_PROVIDER_SITE_OTHER): Payer: PRIVATE HEALTH INSURANCE | Admitting: Psychology

## 2016-05-29 DIAGNOSIS — F319 Bipolar disorder, unspecified: Secondary | ICD-10-CM

## 2016-06-05 ENCOUNTER — Ambulatory Visit (INDEPENDENT_AMBULATORY_CARE_PROVIDER_SITE_OTHER): Payer: PRIVATE HEALTH INSURANCE | Admitting: Psychology

## 2016-06-05 DIAGNOSIS — F319 Bipolar disorder, unspecified: Secondary | ICD-10-CM | POA: Diagnosis not present

## 2016-06-11 ENCOUNTER — Other Ambulatory Visit: Payer: Self-pay | Admitting: *Deleted

## 2016-06-11 MED ORDER — POTASSIUM CHLORIDE CRYS ER 20 MEQ PO TBCR: 20.0000 meq | EXTENDED_RELEASE_TABLET | Freq: Every day | ORAL | 0 refills | Status: DC

## 2016-06-12 ENCOUNTER — Ambulatory Visit (INDEPENDENT_AMBULATORY_CARE_PROVIDER_SITE_OTHER): Payer: PRIVATE HEALTH INSURANCE | Admitting: Psychology

## 2016-06-12 DIAGNOSIS — F319 Bipolar disorder, unspecified: Secondary | ICD-10-CM | POA: Diagnosis not present

## 2016-06-19 ENCOUNTER — Ambulatory Visit (INDEPENDENT_AMBULATORY_CARE_PROVIDER_SITE_OTHER): Payer: PRIVATE HEALTH INSURANCE | Admitting: Psychology

## 2016-06-19 DIAGNOSIS — F319 Bipolar disorder, unspecified: Secondary | ICD-10-CM | POA: Diagnosis not present

## 2016-06-26 ENCOUNTER — Ambulatory Visit (INDEPENDENT_AMBULATORY_CARE_PROVIDER_SITE_OTHER): Payer: PRIVATE HEALTH INSURANCE | Admitting: Psychology

## 2016-06-26 DIAGNOSIS — F319 Bipolar disorder, unspecified: Secondary | ICD-10-CM | POA: Diagnosis not present

## 2016-07-03 ENCOUNTER — Ambulatory Visit (INDEPENDENT_AMBULATORY_CARE_PROVIDER_SITE_OTHER): Payer: PRIVATE HEALTH INSURANCE | Admitting: Psychology

## 2016-07-03 DIAGNOSIS — F319 Bipolar disorder, unspecified: Secondary | ICD-10-CM

## 2016-07-10 ENCOUNTER — Ambulatory Visit (INDEPENDENT_AMBULATORY_CARE_PROVIDER_SITE_OTHER): Payer: PRIVATE HEALTH INSURANCE | Admitting: Psychology

## 2016-07-10 DIAGNOSIS — F319 Bipolar disorder, unspecified: Secondary | ICD-10-CM | POA: Diagnosis not present

## 2016-07-10 NOTE — Progress Notes (Signed)
HPI This patient presents for evaluation of a mildly reduced ejection fraction. Since I last saw her she has done well.  She has battled depression.  She has not been in the hospital since I saw her.  She actually has not required any oxygen during the day but still wears at night.  She has CPAP. She denies any chest pressure, neck or arm discomfort. She's not having any palpitations, presyncope or syncope. She denies any PND or orthopnea. She manages her volume with when necessary diuretics.    Allergies  Allergen Reactions  . Savella [Milnacipran Hcl]     mania    Current Outpatient Prescriptions  Medication Sig Dispense Refill  . acetaminophen (TYLENOL) 500 MG tablet Take 2,000 mg by mouth 2 (two) times daily. Taking every day per patient    . albuterol (PROVENTIL HFA;VENTOLIN HFA) 108 (90 Base) MCG/ACT inhaler Inhale 2 puffs into the lungs every 6 (six) hours as needed for wheezing or shortness of breath. 1 Inhaler 3  . ALPRAZolam (XANAX) 1 MG tablet Take 1 mg by mouth 3 (three) times daily.     . Ascorbic Acid (VITAMIN C) 500 MG tablet Take 1,000 mg by mouth daily.     [name] aspirin 81 MG tablet Take 81 mg by mouth daily.     [name] atorvastatin (LIPITOR) 20 MG tablet Take 1 tablet (20 mg total) by mouth daily at 6 PM. 30 tablet 11  . b complex vitamins tablet Take 1 tablet by mouth daily.    . Cholecalciferol (VITAMIN D3) 5000 UNITS TABS Take 3 tablets by mouth daily.     . Chromium Picolinate 800 MCG TABS Take 800 mcg by mouth daily.    . clonazePAM (KLONOPIN) 1 MG tablet Take 1 tablet (1 mg total) by mouth 3 (three) times daily. 30 tablet 0  . furosemide (LASIX) 40 MG tablet TAKE 2 TABLETS BY MOUTH 2 TIMES DAILY 360 tablet 3  . lamoTRIgine (LAMICTAL) 200 MG tablet Take 200 mg by mouth 2 (two) times daily.     [name] lidocaine (LIDODERM) 5 % Place 1 patch onto the skin daily as needed (for knee pain). Remove & Discard patch within 12 hours or as directed by MD 30 patch 0  . losartan  (COZAAR) 25 MG tablet Take 1 tablet (25 mg total) by mouth 2 (two) times daily. 180 tablet 3  . Lurasidone HCl 60 MG TABS Take 60 mg by mouth daily.    [name] LYRICA 300 MG capsule Take 1 tablet by mouth Twice daily.    [name] MAGNESIUM PO Take 1 tablet by mouth daily.    . Meloxicam (MOBIC PO) Take 7.5 mg by mouth every 12 (twelve) hours.     . metFORMIN (GLUCOPHAGE) 500 MG tablet TAKE 1 TABLET BY MOUTH TWICE DAILY WITH MEALS 180 tablet 1  . Multiple Vitamin (MULTIVITAMIN) tablet Take 1 tablet by mouth daily.      . norethindrone-ethinyl estradiol-iron (MICROGESTIN FE 1.5/30) 1.5-30 MG-MCG tablet Take 1 tablet by mouth at bedtime. Takes continuously    . potassium chloride SA (KLOR-CON M20) 20 MEQ tablet Take 1 tablet (20 mEq total) by mouth daily. 90 tablet 0  . Zinc 50 MG CAPS Take 50 mg by mouth daily.     No current facility-administered medications for this visit.     Past Medical History:  Diagnosis Date  . Abrasion of breast 06/21/2011   left  . Acute systolic congestive heart failure ([place])   .  Agoraphobia   . Allergy   . Anxiety    panic attacks  . Arthritis    osteoarthritis bilal. knees  . Asthma    no per PFT 7/13; reports does not have asthma  . Bipolar disorder ([place])   . Carpal tunnel syndrome of left wrist 05/2011   Being evaluated for MS  . Cor pulmonale ([place])   . Depression   . History of pleural effusion   . Hyperlipidemia   . Hypertension   . Hypoxemia    history of - no home O2  . IBS (irritable bowel syndrome)   . Morbid obesity ([place])   . Obesity hypoventilation syndrome ([place])   . Prediabetes   . Seasonal allergies    current runny nose  . Seizures ([place])    febrile seizure x 1 as a child. Several times  . Sleep apnea sleep study 07/02/2010   uses CPAP nightly  . Spinal stenosis in cervical region   . Urinary incontinence     Past Surgical History:  Procedure Laterality Date  . CARPAL TUNNEL RELEASE  06/27/2011   Procedure: CARPAL TUNNEL RELEASE;  Surgeon:  Wynonia Sours, MD;  Location: [facility];  Service: Orthopedics;  Laterality: Left;  . CARPAL TUNNEL RELEASE  12/19/2011   Procedure: CARPAL TUNNEL RELEASE;  Surgeon: Wynonia Sours, MD;  Location: [facility];  Service: Orthopedics;  Laterality: Right;  . MASS EXCISION  08/07/2010   right index  . POSTERIOR CERVICAL FUSION/FORAMINOTOMY  09/11/2011   Procedure: POSTERIOR CERVICAL FUSION/FORAMINOTOMY LEVEL 1;  Surgeon: Erline Levine, MD;  Location: [facility];  Service: Neurosurgery;  Laterality: N/A;  Cervical Three-Four Posteior Cervical Fusion and Decompression.  [name] TRIGGER FINGER RELEASE  12/19/2011   Procedure: RELEASE TRIGGER FINGER/A-1 PULLEY;  Surgeon: Wynonia Sours, MD;  Location: [facility];  Service: Orthopedics;  Laterality: Right;  . TRIGGER FINGER RELEASE Left 12/31/2012   Procedure: RELEASE A-1 PULLEY LEFT THUMB;  Surgeon: Wynonia Sours, MD;  Location: [facility];  Service: Orthopedics;  Laterality: Left;  . WISDOM TOOTH EXTRACTION      ROS:   Positive for back pain otherwise as stated in the HPI and negative for all other systems.   PHYSICAL EXAM BP 136/80   Pulse 85   Ht 5\' 5"  (1.651 m)   Wt (!) 342 lb (155.1 kg)   BMI 56.91 kg/m   GENERAL:  Well appearing NECK:  No jugular venous distention, waveform within normal limits, carotid upstroke brisk and symmetric, no bruits, no thyromegaly LUNGS:  Clear to auscultation bilaterally CHEST:  Unremarkable HEART:  PMI not displaced or sustained,S1 and S2 within normal limits, no S3, no S4, no clicks, no rubs, no murmurs ABD:  Flat, positive bowel sounds normal in frequency in pitch, no bruits, no rebound, no guarding, no midline pulsatile mass, no hepatomegaly, no splenomegaly EXT:  2 plus pulses throughout, mild edema, no cyanosis no clubbing  EKG:  Sinus rhythm, rate 85, axis within normal limits, intervals within normal limits, no acute ST-T wave.  Lab Results  Component  Value Date   CHOL 165 11/24/2015   TRIG 131.0 11/24/2015   HDL 61.00 11/24/2015   LDLCALC 77 11/24/2015     ASSESSMENT AND PLAN  Cardiomyopathy - She had mild cardiomyopathy last year.  I don't suspect any change based on symptoms.  No change in therapy is indicated.  I will likely repeat an echo next year.  Pulmonary HTN - This was moderate last year on echo.  I will follow as above.     HTN -  Her BP is well controlled.  She will continue on the meds as listed.    Dyslipidemia - Her lipid ratio was excellent as above.  No change in therapy is planned.

## 2016-07-12 ENCOUNTER — Ambulatory Visit (INDEPENDENT_AMBULATORY_CARE_PROVIDER_SITE_OTHER): Payer: 59 | Admitting: Cardiology

## 2016-07-12 ENCOUNTER — Encounter: Payer: Self-pay | Admitting: Cardiology

## 2016-07-12 VITALS — BP 136/80 | HR 85 | Ht 65.0 in | Wt 342.0 lb

## 2016-07-12 DIAGNOSIS — I5032 Chronic diastolic (congestive) heart failure: Secondary | ICD-10-CM | POA: Diagnosis not present

## 2016-07-12 DIAGNOSIS — I272 Pulmonary hypertension, unspecified: Secondary | ICD-10-CM | POA: Diagnosis not present

## 2016-07-12 NOTE — Patient Instructions (Signed)

## 2016-07-17 ENCOUNTER — Ambulatory Visit (INDEPENDENT_AMBULATORY_CARE_PROVIDER_SITE_OTHER): Payer: PRIVATE HEALTH INSURANCE | Admitting: Psychology

## 2016-07-17 DIAGNOSIS — F319 Bipolar disorder, unspecified: Secondary | ICD-10-CM

## 2016-07-24 ENCOUNTER — Ambulatory Visit (INDEPENDENT_AMBULATORY_CARE_PROVIDER_SITE_OTHER): Payer: PRIVATE HEALTH INSURANCE | Admitting: Psychology

## 2016-07-24 DIAGNOSIS — F319 Bipolar disorder, unspecified: Secondary | ICD-10-CM

## 2016-07-31 ENCOUNTER — Ambulatory Visit (INDEPENDENT_AMBULATORY_CARE_PROVIDER_SITE_OTHER): Payer: PRIVATE HEALTH INSURANCE | Admitting: Psychology

## 2016-07-31 DIAGNOSIS — F319 Bipolar disorder, unspecified: Secondary | ICD-10-CM

## 2016-08-07 ENCOUNTER — Ambulatory Visit (INDEPENDENT_AMBULATORY_CARE_PROVIDER_SITE_OTHER): Payer: PRIVATE HEALTH INSURANCE | Admitting: Psychology

## 2016-08-07 DIAGNOSIS — F319 Bipolar disorder, unspecified: Secondary | ICD-10-CM

## 2016-08-14 ENCOUNTER — Ambulatory Visit (INDEPENDENT_AMBULATORY_CARE_PROVIDER_SITE_OTHER): Payer: PRIVATE HEALTH INSURANCE | Admitting: Psychology

## 2016-08-14 DIAGNOSIS — F319 Bipolar disorder, unspecified: Secondary | ICD-10-CM

## 2016-08-15 ENCOUNTER — Other Ambulatory Visit: Payer: Self-pay | Admitting: Cardiology

## 2016-08-21 ENCOUNTER — Ambulatory Visit: Payer: Medicare Other | Admitting: Psychology

## 2016-08-26 ENCOUNTER — Other Ambulatory Visit: Payer: Self-pay | Admitting: Cardiology

## 2016-08-28 ENCOUNTER — Ambulatory Visit (INDEPENDENT_AMBULATORY_CARE_PROVIDER_SITE_OTHER): Payer: PRIVATE HEALTH INSURANCE | Admitting: Psychology

## 2016-08-28 ENCOUNTER — Ambulatory Visit (INDEPENDENT_AMBULATORY_CARE_PROVIDER_SITE_OTHER): Payer: 59 | Admitting: Family Medicine

## 2016-08-28 ENCOUNTER — Encounter: Payer: Self-pay | Admitting: Family Medicine

## 2016-08-28 VITALS — BP 128/78 | HR 87 | Temp 98.7°F | Ht 65.0 in | Wt 338.2 lb

## 2016-08-28 DIAGNOSIS — M21962 Unspecified acquired deformity of left lower leg: Secondary | ICD-10-CM

## 2016-08-28 DIAGNOSIS — I429 Cardiomyopathy, unspecified: Secondary | ICD-10-CM | POA: Diagnosis not present

## 2016-08-28 DIAGNOSIS — E785 Hyperlipidemia, unspecified: Secondary | ICD-10-CM | POA: Diagnosis not present

## 2016-08-28 DIAGNOSIS — E118 Type 2 diabetes mellitus with unspecified complications: Secondary | ICD-10-CM | POA: Diagnosis not present

## 2016-08-28 DIAGNOSIS — I10 Essential (primary) hypertension: Secondary | ICD-10-CM

## 2016-08-28 DIAGNOSIS — E1159 Type 2 diabetes mellitus with other circulatory complications: Secondary | ICD-10-CM | POA: Diagnosis not present

## 2016-08-28 DIAGNOSIS — E1169 Type 2 diabetes mellitus with other specified complication: Secondary | ICD-10-CM

## 2016-08-28 DIAGNOSIS — I272 Pulmonary hypertension, unspecified: Secondary | ICD-10-CM | POA: Diagnosis not present

## 2016-08-28 DIAGNOSIS — E662 Morbid (severe) obesity with alveolar hypoventilation: Secondary | ICD-10-CM | POA: Diagnosis not present

## 2016-08-28 DIAGNOSIS — I5042 Chronic combined systolic (congestive) and diastolic (congestive) heart failure: Secondary | ICD-10-CM | POA: Diagnosis not present

## 2016-08-28 DIAGNOSIS — F319 Bipolar disorder, unspecified: Secondary | ICD-10-CM

## 2016-08-28 MED ORDER — FLUTICASONE PROPIONATE 50 MCG/ACT NA SUSP: 1.0000 | Freq: Every day | NASAL | 6 refills | Status: DC

## 2016-08-28 MED ORDER — LIDOCAINE 5 % EX PTCH: 1.0000 | MEDICATED_PATCH | Freq: Every day | CUTANEOUS | 0 refills | Status: DC | PRN

## 2016-08-28 NOTE — Progress Notes (Signed)
HPI:  [name] is a pleasant 46 yo with an extensive PMH significant for morbid obesity, obesity hypoventilation syndrome, possible asthma, HTN, HLD, seasonal allergies, OSA, cardiomyopathy, anxiety, mild diabetes, bipolar disorder, cor pulmonale, hx CHF followed by multiple specialists including pulmonology, cardiology and psychiatry She reports she has been doing well for the most part. She saw her cardiologist recently and reports she was told that she is doing quite well and can follow up yearly now. She is working on medication changes with your psychiatrist, but overall feels like her mood is in a good place currently. She has been working on changes to her diet and reports she has had a 30 pound weight loss in the last 3 months by decreasing her caloric intake to around 2000 cal per day. She plans to start exercising more and is trying to purchase a rowing machine for her home. She is curious to check her labs and see where her cholesterol and diabetes labs are with her current weight loss. She will not let us check her weight here, but she did want [place] to note in our records that her weight at home is 335. She watches her weight carefully at home and prefers not to check at appointments as this causes extreme anxiety for her. She noticed a protuberance on her left upper tibia. She sees a specialist for knee pains and she plans to have them look at this, but wanted me to take a look at this as well. It really is not very painful. She does want a refill on Lidoderm dermal patches which she sometimes uses for chronic knee pain. No giving away, locking or catching.  ROS: See pertinent positives and negatives per HPI.  Past Medical History:  Diagnosis Date  . Abrasion of breast 06/21/2011   left  . Acute systolic congestive heart failure ([place])   . Agoraphobia   . Allergy   . Anxiety    panic attacks  . Arthritis    osteoarthritis bilal. knees  . Asthma    no per PFT 7/13; reports  does not have asthma  . Bipolar disorder ([place])   . Carpal tunnel syndrome of left wrist 05/2011   Being evaluated for MS  . Cor pulmonale ([place])   . Depression   . History of pleural effusion   . Hyperlipidemia   . Hypertension   . Hypoxemia    history of - no home O2  . IBS (irritable bowel syndrome)   . Morbid obesity ([place])   . Obesity hypoventilation syndrome ([place])   . Prediabetes   . Seasonal allergies    current runny nose  . Seizures ([place])    febrile seizure x 1 as a child. Several times  . Sleep apnea sleep study 07/02/2010   uses CPAP nightly  . Spinal stenosis in cervical region   . Urinary incontinence     Past Surgical History:  Procedure Laterality Date  . CARPAL TUNNEL RELEASE  06/27/2011   Procedure: CARPAL TUNNEL RELEASE;  Surgeon: Wynonia Sours, MD;  Location: [place];  Service: Orthopedics;  Laterality: Left;  . CARPAL TUNNEL RELEASE  12/19/2011   Procedure: CARPAL TUNNEL RELEASE;  Surgeon: Wynonia Sours, MD;  Location: [place];  Service: Orthopedics;  Laterality: Right;  . MASS EXCISION  08/07/2010   right index  . POSTERIOR CERVICAL FUSION/FORAMINOTOMY  09/11/2011   Procedure: POSTERIOR CERVICAL FUSION/FORAMINOTOMY LEVEL 1;  Surgeon: Erline Levine, MD;  Location: [place]  [facility];  Service: Neurosurgery;  Laterality: N/A;  Cervical Three-Four Posteior Cervical Fusion and Decompression.  [name] TRIGGER FINGER RELEASE  12/19/2011   Procedure: RELEASE TRIGGER FINGER/A-1 PULLEY;  Surgeon: Wynonia Sours, MD;  Location: [place];  Service: Orthopedics;  Laterality: Right;  . TRIGGER FINGER RELEASE Left 12/31/2012   Procedure: RELEASE A-1 PULLEY LEFT THUMB;  Surgeon: Wynonia Sours, MD;  Location: [place];  Service: Orthopedics;  Laterality: Left;  . WISDOM TOOTH EXTRACTION      Family History  Problem Relation Age of Onset  . Asthma Mother   . Allergic rhinitis Mother   . Multiple sclerosis Mother   . Asthma  Maternal Grandmother     Social History   Social History  . Marital status: Married    Spouse name: N/A  . Number of children: N/A  . Years of education: N/A   Occupational History  . currently unemployed    Social History Main Topics  . Smoking status: Never Smoker  . Smokeless tobacco: Never Used  . Alcohol use Yes     Comment: rare  . Drug use: No  . Sexual activity: Not Asked   Other Topics Concern  . None   Social History Narrative   Lives in [place]           Current Outpatient Prescriptions:  .  acetaminophen (TYLENOL) 500 MG tablet, Take 2,000 mg by mouth 2 (two) times daily. Taking every day per patient, Disp: , Rfl:  .  albuterol (PROVENTIL HFA;VENTOLIN HFA) 108 (90 Base) MCG/ACT inhaler, Inhale 2 puffs into the lungs every 6 (six) hours as needed for wheezing or shortness of breath., Disp: 1 Inhaler, Rfl: 3 .  ALPRAZolam (XANAX) 1 MG tablet, Take 1 mg by mouth 3 (three) times daily. , Disp: , Rfl:  .  Ascorbic Acid (VITAMIN C) 500 MG tablet, Take 1,000 mg by mouth daily. , Disp: , Rfl:  .  aspirin 81 MG tablet, Take 81 mg by mouth daily. , Disp: , Rfl:  .  atorvastatin (LIPITOR) 20 MG tablet, Take 1 tablet (20 mg total) by mouth every evening., Disp: 90 tablet, Rfl: 3 .  b complex vitamins tablet, Take 1 tablet by mouth daily., Disp: , Rfl:  .  Cholecalciferol (VITAMIN D3) 5000 UNITS TABS, Take 3 tablets by mouth daily. , Disp: , Rfl:  .  Chromium Picolinate 800 MCG TABS, Take 800 mcg by mouth daily., Disp: , Rfl:  .  clonazePAM (KLONOPIN) 1 MG tablet, Take 1 tablet (1 mg total) by mouth 3 (three) times daily., Disp: 30 tablet, Rfl: 0 .  furosemide (LASIX) 40 MG tablet, TAKE 2 TABLETS BY MOUTH 2 TIMES DAILY, Disp: 360 tablet, Rfl: 3 .  lamoTRIgine (LAMICTAL) 200 MG tablet, Take 200 mg by mouth 2 (two) times daily. , Disp: , Rfl:  .  lidocaine (LIDODERM) 5 %, Place 1 patch onto the skin daily as needed (for knee pain). Remove & Discard patch within 12 hours  or as directed by MD, Disp: 30 patch, Rfl: 0 .  losartan (COZAAR) 25 MG tablet, TAKE 1 TABLET TWICE A DAY, Disp: 180 tablet, Rfl: 3 .  Lurasidone HCl 60 MG TABS, Take 20 mg by mouth daily. , Disp: , Rfl:  .  LYRICA 300 MG capsule, Take 1 tablet by mouth Twice daily., Disp: , Rfl:  .  MAGNESIUM PO, Take 1 tablet by mouth daily., Disp: , Rfl:  .  Meloxicam (MOBIC PO), Take  7.5 mg by mouth every 12 (twelve) hours. , Disp: , Rfl:  .  metFORMIN (GLUCOPHAGE) 500 MG tablet, TAKE 1 TABLET BY MOUTH TWICE DAILY WITH MEALS, Disp: 180 tablet, Rfl: 1 .  Multiple Vitamin (MULTIVITAMIN) tablet, Take 1 tablet by mouth daily.  , Disp: , Rfl:  .  norethindrone-ethinyl estradiol-iron (MICROGESTIN FE 1.5/30) 1.5-30 MG-MCG tablet, Take 1 tablet by mouth at bedtime. Takes continuously, Disp: , Rfl:  .  potassium chloride SA (KLOR-CON M20) 20 MEQ tablet, Take 1 tablet (20 mEq total) by mouth daily., Disp: 90 tablet, Rfl: 0 .  Zinc 50 MG CAPS, Take 50 mg by mouth daily., Disp: , Rfl:  .  fluticasone (FLONASE) 50 MCG/ACT nasal spray, Place 1 spray into both nostrils daily., Disp: 16 g, Rfl: 6  EXAM:  Vitals:   08/28/16 1616  BP: 128/78  Pulse: 87  Temp: 98.7 F (37.1 C)    Body mass index is 56.28 kg/m.  GENERAL: vitals reviewed and listed above, alert, oriented, appears well hydrated and in no acute distress  HEENT: atraumatic, conjunttiva clear, no obvious abnormalities on inspection of external nose and ears  NECK: no obvious masses on inspection  LUNGS: clear to auscultation bilaterally, no wheezes, rales or rhonchi, good air movement  CV: HRRR, no peripheral edema  MS: moves all extremities without noticeable abnormality, nontender bony protuberance on the upper left tibia slightly lateral to the midline  PSYCH: pleasant and cooperative, no obvious depression or anxiety  ASSESSMENT AND PLAN:  Discussed the following assessment and plan:  Type 2 diabetes mellitus with complication, without  long-term current use of insulin (HCC) - Plan: Hemoglobin A1c -cont current tx, labs -lifestyle recs  Hypertension associated with diabetes ([place]) - Plan: Basic metabolic panel, CBC Hyperlipidemia associated with type 2 diabetes mellitus ([place]) - Plan: Lipid panel -labs, cont current tx -lifestyle recs  Morbid obesity ([place]) -congratulated on changes, she will not allow [place] to check her weight here, she does look thinner -encouraged lifeslong healthy lifestyle  Acquired deformity of left knee -eval with her specialist  Obesity hypoventilation syndrome (HCC) -wt reduction advised, congratulated on changes  Bipolar affective disorder, remission status unspecified ([place]) -sees psychiatrist for management  Cardiomyopathy, unspecified type ([place]) Chronic combined systolic and diastolic CHF (congestive heart failure) ([place]) Pulmonary hypertension ([place]) -sees cardiologist for management   -Congratulated her on her lifestyle changes and weight reduction, encouraged to continue lifelong healthy diet and regular exercise as able -Ordered the labs, she plans to return to check these fasting -I agree with having her specialist look at her knee and she agrees to set this up -2 for Medicare annual wellness visit in November and will follow up then -Patient advised to return or notify a doctor immediately if symptoms worsen or persist or new concerns arise.  Patient Instructions  BEFORE YOU LEAVE: -follow up: 1) lab visit in next 1-2 weeks for fasting labs 2) AWV with Manuela Schwartz and follow up with Dr. Maudie Mercury on the SAME day in November  Congratulations on the diet changes and weight loss!  See your knee specialist about the knee concerns.  We have ordered labs or studies at this visit. It can take up to 1-2 weeks for results and processing. IF results require follow up or explanation, we will call you with instructions. Clinically stable results will be released to your [facility]. If you have not heard  from [place] or cannot find your results in [facility] in 2 weeks please contact our office at  [phone].  If you are not yet signed up for [facility], please consider signing up.   We recommend the following healthy lifestyle for LIFE: 1) Small portions.   Tip: eat off of a salad plate instead of a dinner plate.  Tip: It is ok to feel hungry after a meal - that likely means you ate an appropriate portion.  Tip: if you need more or a snack choose fruits, veggies and/or a handful of nuts or seeds.  2) Eat a healthy clean diet.   TRY TO EAT: -at least 5-7 servings of low sugar vegetables per day (not corn, potatoes or bananas.) -berries are the best choice if you wish to eat fruit.   -lean meets (fish, chicken or [place] breasts) -vegan proteins for some meals - beans or tofu, whole grains, nuts and seeds -Replace bad fats with good fats - good fats include: fish, nuts and seeds, canola oil, olive oil -small amounts of low fat or non fat dairy -small amounts of100 % whole grains - check the lables  AVOID: -SUGAR, sweets, anything with added sugar, corn syrup or sweeteners -if you must have a sweetener, small amounts of stevia may be best -sweetened beverages -simple starches (rice, bread, potatoes, pasta, chips, etc - small amounts of 100% whole grains are ok) -red meat, pork, butter -fried foods, fast food, processed food, excessive dairy, eggs and coconut.  3)Get at least 150 minutes of sweaty aerobic exercise per week.  4)Reduce stress - consider counseling, meditation and relaxation to balance other aspects of your life.         Colin Benton R., DO

## 2016-08-28 NOTE — Patient Instructions (Signed)
BEFORE YOU LEAVE: -follow up: 1) lab visit in next 1-2 weeks for fasting labs 2) AWV with [name] and follow up with [name] on the SAME day in November  Congratulations on the diet changes and weight loss!  See your knee specialist about the knee concerns.  We have ordered labs or studies at this visit. It can take up to 1-2 weeks for results and processing. IF results require follow up or explanation, we will call you with instructions. Clinically stable results will be released to your [facility]. If you have not heard from [place] or cannot find your results in [facility] in 2 weeks please contact our office at [phone].  If you are not yet signed up for [facility], please consider signing up.   We recommend the following healthy lifestyle for LIFE: 1) Small portions.   Tip: eat off of a salad plate instead of a dinner plate.  Tip: It is ok to feel hungry after a meal - that likely means you ate an appropriate portion.  Tip: if you need more or a snack choose fruits, veggies and/or a handful of nuts or seeds.  2) Eat a healthy clean diet.   TRY TO EAT: -at least 5-7 servings of low sugar vegetables per day (not corn, potatoes or bananas.) -berries are the best choice if you wish to eat fruit.   -lean meets (fish, chicken or [place] breasts) -vegan proteins for some meals - beans or tofu, whole grains, nuts and seeds -Replace bad fats with good fats - good fats include: fish, nuts and seeds, canola oil, olive oil -small amounts of low fat or non fat dairy -small amounts of100 % whole grains - check the lables  AVOID: -SUGAR, sweets, anything with added sugar, corn syrup or sweeteners -if you must have a sweetener, small amounts of stevia may be best -sweetened beverages -simple starches (rice, bread, potatoes, pasta, chips, etc - small amounts of 100% whole grains are ok) -red meat, pork, butter -fried foods, fast food, processed food, excessive dairy, eggs and coconut.  3)Get at least  150 minutes of sweaty aerobic exercise per week.  4)Reduce stress - consider counseling, meditation and relaxation to balance other aspects of your life.

## 2016-08-29 ENCOUNTER — Other Ambulatory Visit: Payer: Self-pay | Admitting: Cardiology

## 2016-08-29 MED ORDER — ATORVASTATIN CALCIUM 20 MG PO TABS: 20.0000 mg | ORAL_TABLET | Freq: Every evening | ORAL | 3 refills | Status: DC

## 2016-08-29 MED ORDER — LOSARTAN POTASSIUM 25 MG PO TABS
25.0000 mg | ORAL_TABLET | Freq: Two times a day (BID) | ORAL | 3 refills | Status: DC
Start: 2016-08-29 — End: 2017-09-22

## 2016-08-29 MED ORDER — FUROSEMIDE 40 MG PO TABS: 80.0000 mg | ORAL_TABLET | Freq: Two times a day (BID) | ORAL | 3 refills | Status: DC

## 2016-08-29 MED ORDER — POTASSIUM CHLORIDE CRYS ER 20 MEQ PO TBCR: 20.0000 meq | EXTENDED_RELEASE_TABLET | Freq: Every day | ORAL | 0 refills | Status: DC

## 2016-08-29 NOTE — Telephone Encounter (Signed)
New message    Pt is calling. She has a question about her medication. She would like all her prescriptions called into [facility]. She [facility] so [facility].

## 2016-08-29 NOTE — Telephone Encounter (Signed)
Spoke with pt, refills sent to [facility] as requested

## 2016-09-04 ENCOUNTER — Ambulatory Visit (INDEPENDENT_AMBULATORY_CARE_PROVIDER_SITE_OTHER): Payer: PRIVATE HEALTH INSURANCE | Admitting: Psychology

## 2016-09-04 DIAGNOSIS — F319 Bipolar disorder, unspecified: Secondary | ICD-10-CM

## 2016-09-05 LAB — HM DIABETES EYE EXAM

## 2016-09-11 ENCOUNTER — Ambulatory Visit (INDEPENDENT_AMBULATORY_CARE_PROVIDER_SITE_OTHER): Payer: PRIVATE HEALTH INSURANCE | Admitting: Psychology

## 2016-09-11 DIAGNOSIS — F319 Bipolar disorder, unspecified: Secondary | ICD-10-CM

## 2016-09-18 ENCOUNTER — Ambulatory Visit: Payer: PRIVATE HEALTH INSURANCE | Admitting: Psychology

## 2016-09-25 ENCOUNTER — Ambulatory Visit (INDEPENDENT_AMBULATORY_CARE_PROVIDER_SITE_OTHER): Payer: PRIVATE HEALTH INSURANCE | Admitting: Psychology

## 2016-09-25 DIAGNOSIS — F319 Bipolar disorder, unspecified: Secondary | ICD-10-CM | POA: Diagnosis not present

## 2016-10-02 ENCOUNTER — Ambulatory Visit (INDEPENDENT_AMBULATORY_CARE_PROVIDER_SITE_OTHER): Payer: PRIVATE HEALTH INSURANCE | Admitting: Psychology

## 2016-10-02 DIAGNOSIS — F319 Bipolar disorder, unspecified: Secondary | ICD-10-CM

## 2016-10-08 ENCOUNTER — Telehealth: Payer: Self-pay | Admitting: *Deleted

## 2016-10-08 NOTE — Telephone Encounter (Signed)
-----   Message from Lucretia Kern, DO sent at 10/07/2016  8:53 PM EDT ----- Please check to see if still needed/lab visit needed? ----- Message ----- From: SYSTEM Sent: 09/30/2016  12:05 AM To: Lucretia Kern, DO

## 2016-10-08 NOTE — Telephone Encounter (Signed)
I left a detailed message at the pts home number to call back and schedule a lab appt.

## 2016-10-09 ENCOUNTER — Ambulatory Visit (INDEPENDENT_AMBULATORY_CARE_PROVIDER_SITE_OTHER): Payer: PRIVATE HEALTH INSURANCE | Admitting: Psychology

## 2016-10-09 DIAGNOSIS — F319 Bipolar disorder, unspecified: Secondary | ICD-10-CM | POA: Diagnosis not present

## 2016-10-15 ENCOUNTER — Telehealth: Payer: Self-pay | Admitting: Family Medicine

## 2016-10-15 DIAGNOSIS — I10 Essential (primary) hypertension: Secondary | ICD-10-CM

## 2016-10-15 DIAGNOSIS — E118 Type 2 diabetes mellitus with unspecified complications: Secondary | ICD-10-CM

## 2016-10-15 DIAGNOSIS — E785 Hyperlipidemia, unspecified: Secondary | ICD-10-CM

## 2016-10-15 NOTE — Telephone Encounter (Signed)
Pt states she is to schedule labs per message from last week.  I think the order in has expired. Can you re enter those labs?  Pt is on the schedule for Thursday.  Thanks.

## 2016-10-15 NOTE — Telephone Encounter (Signed)
Orders re-entered

## 2016-10-16 ENCOUNTER — Ambulatory Visit (INDEPENDENT_AMBULATORY_CARE_PROVIDER_SITE_OTHER): Payer: PRIVATE HEALTH INSURANCE | Admitting: Psychology

## 2016-10-16 DIAGNOSIS — F319 Bipolar disorder, unspecified: Secondary | ICD-10-CM | POA: Diagnosis not present

## 2016-10-18 ENCOUNTER — Other Ambulatory Visit: Payer: Self-pay

## 2016-10-18 ENCOUNTER — Other Ambulatory Visit (INDEPENDENT_AMBULATORY_CARE_PROVIDER_SITE_OTHER): Payer: 59

## 2016-10-18 ENCOUNTER — Encounter: Payer: Self-pay | Admitting: Pediatrics

## 2016-10-18 DIAGNOSIS — E785 Hyperlipidemia, unspecified: Secondary | ICD-10-CM | POA: Diagnosis not present

## 2016-10-18 DIAGNOSIS — E118 Type 2 diabetes mellitus with unspecified complications: Secondary | ICD-10-CM

## 2016-10-18 DIAGNOSIS — I10 Essential (primary) hypertension: Secondary | ICD-10-CM

## 2016-10-18 LAB — CBC WITH DIFFERENTIAL/PLATELET
BASOS PCT: 1.1 % (ref 0.0–3.0)
Basophils Absolute: 0.1 10*3/uL (ref 0.0–0.1)
EOS ABS: 0.1 10*3/uL (ref 0.0–0.7)
EOS PCT: 1.4 % (ref 0.0–5.0)
HCT: 45.2 % (ref 36.0–46.0)
Hemoglobin: 14.8 g/dL (ref 12.0–15.0)
LYMPHS ABS: 3.9 10*3/uL (ref 0.7–4.0)
Lymphocytes Relative: 41.6 % (ref 12.0–46.0)
MCHC: 32.8 g/dL (ref 30.0–36.0)
MCV: 98.3 fl (ref 78.0–100.0)
MONO ABS: 0.4 10*3/uL (ref 0.1–1.0)
Monocytes Relative: 4.4 % (ref 3.0–12.0)
NEUTROS PCT: 51.5 % (ref 43.0–77.0)
Neutro Abs: 4.8 10*3/uL (ref 1.4–7.7)
PLATELETS: 237 10*3/uL (ref 150.0–400.0)
RBC: 4.6 Mil/uL (ref 3.87–5.11)
RDW: 13.7 % (ref 11.5–15.5)
WBC: 9.3 10*3/uL (ref 4.0–10.5)

## 2016-10-18 LAB — LIPID PANEL
CHOLESTEROL: 146 mg/dL (ref 0–200)
HDL: 60.9 mg/dL (ref >39.00)
LDL CALC: 45 mg/dL (ref 0–99)
NonHDL: 84.7
TRIGLYCERIDES: 198 mg/dL — AB (ref 0.0–149.0)
Total CHOL/HDL Ratio: 2
VLDL: 39.6 mg/dL (ref 0.0–40.0)

## 2016-10-18 LAB — BASIC METABOLIC PANEL
BUN: 24 mg/dL — ABNORMAL HIGH (ref 6–23)
CALCIUM: 9.5 mg/dL (ref 8.4–10.5)
CHLORIDE: 102 meq/L (ref 96–112)
CO2: 31 mEq/L (ref 19–32)
CREATININE: 1.07 mg/dL (ref 0.40–1.20)
GFR: 58.44 mL/min — ABNORMAL LOW (ref >60.00)
Glucose, Bld: 126 mg/dL — ABNORMAL HIGH (ref 70–99)
Potassium: 4.2 mEq/L (ref 3.5–5.1)
SODIUM: 140 meq/L (ref 135–145)

## 2016-10-18 LAB — HEMOGLOBIN A1C: Hgb A1c MFr Bld: 6.1 % (ref 4.6–6.5)

## 2016-10-23 ENCOUNTER — Ambulatory Visit (INDEPENDENT_AMBULATORY_CARE_PROVIDER_SITE_OTHER): Payer: PRIVATE HEALTH INSURANCE | Admitting: Psychology

## 2016-10-23 DIAGNOSIS — F319 Bipolar disorder, unspecified: Secondary | ICD-10-CM | POA: Diagnosis not present

## 2016-10-30 ENCOUNTER — Ambulatory Visit (INDEPENDENT_AMBULATORY_CARE_PROVIDER_SITE_OTHER): Payer: PRIVATE HEALTH INSURANCE | Admitting: Psychology

## 2016-10-30 DIAGNOSIS — F331 Major depressive disorder, recurrent, moderate: Secondary | ICD-10-CM | POA: Diagnosis not present

## 2016-11-05 ENCOUNTER — Ambulatory Visit (INDEPENDENT_AMBULATORY_CARE_PROVIDER_SITE_OTHER): Payer: PRIVATE HEALTH INSURANCE | Admitting: Psychology

## 2016-11-05 DIAGNOSIS — F319 Bipolar disorder, unspecified: Secondary | ICD-10-CM

## 2016-11-06 ENCOUNTER — Ambulatory Visit: Payer: Self-pay | Admitting: Psychology

## 2016-11-13 ENCOUNTER — Ambulatory Visit: Payer: Self-pay | Admitting: Psychology

## 2016-11-19 ENCOUNTER — Ambulatory Visit (INDEPENDENT_AMBULATORY_CARE_PROVIDER_SITE_OTHER): Payer: PRIVATE HEALTH INSURANCE | Admitting: Psychology

## 2016-11-19 DIAGNOSIS — F319 Bipolar disorder, unspecified: Secondary | ICD-10-CM | POA: Diagnosis not present

## 2016-11-20 ENCOUNTER — Ambulatory Visit: Payer: Self-pay | Admitting: Psychology

## 2016-11-26 DIAGNOSIS — Z01419 Encounter for gynecological examination (general) (routine) without abnormal findings: Secondary | ICD-10-CM | POA: Diagnosis not present

## 2016-11-26 DIAGNOSIS — Z1231 Encounter for screening mammogram for malignant neoplasm of breast: Secondary | ICD-10-CM | POA: Diagnosis not present

## 2016-11-26 DIAGNOSIS — Z6841 Body Mass Index (BMI) 40.0 and over, adult: Secondary | ICD-10-CM | POA: Diagnosis not present

## 2016-11-26 DIAGNOSIS — Z3040 Encounter for surveillance of contraceptives, unspecified: Secondary | ICD-10-CM | POA: Diagnosis not present

## 2016-11-27 ENCOUNTER — Ambulatory Visit (INDEPENDENT_AMBULATORY_CARE_PROVIDER_SITE_OTHER): Payer: PRIVATE HEALTH INSURANCE | Admitting: Psychology

## 2016-11-27 DIAGNOSIS — F319 Bipolar disorder, unspecified: Secondary | ICD-10-CM

## 2016-12-04 ENCOUNTER — Ambulatory Visit: Payer: Self-pay | Admitting: Psychology

## 2016-12-11 ENCOUNTER — Ambulatory Visit (INDEPENDENT_AMBULATORY_CARE_PROVIDER_SITE_OTHER): Payer: PRIVATE HEALTH INSURANCE | Admitting: Psychology

## 2016-12-11 DIAGNOSIS — F319 Bipolar disorder, unspecified: Secondary | ICD-10-CM

## 2016-12-18 ENCOUNTER — Ambulatory Visit (INDEPENDENT_AMBULATORY_CARE_PROVIDER_SITE_OTHER): Payer: PRIVATE HEALTH INSURANCE | Admitting: Psychology

## 2016-12-18 DIAGNOSIS — F319 Bipolar disorder, unspecified: Secondary | ICD-10-CM

## 2016-12-24 ENCOUNTER — Other Ambulatory Visit: Payer: Self-pay | Admitting: Cardiology

## 2016-12-25 ENCOUNTER — Ambulatory Visit (INDEPENDENT_AMBULATORY_CARE_PROVIDER_SITE_OTHER): Payer: PRIVATE HEALTH INSURANCE | Admitting: Psychology

## 2016-12-25 DIAGNOSIS — F319 Bipolar disorder, unspecified: Secondary | ICD-10-CM

## 2017-01-01 ENCOUNTER — Ambulatory Visit (INDEPENDENT_AMBULATORY_CARE_PROVIDER_SITE_OTHER): Payer: PRIVATE HEALTH INSURANCE | Admitting: Psychology

## 2017-01-01 DIAGNOSIS — F319 Bipolar disorder, unspecified: Secondary | ICD-10-CM | POA: Diagnosis not present

## 2017-01-08 ENCOUNTER — Ambulatory Visit: Payer: PRIVATE HEALTH INSURANCE | Admitting: Psychology

## 2017-01-15 ENCOUNTER — Ambulatory Visit (INDEPENDENT_AMBULATORY_CARE_PROVIDER_SITE_OTHER): Payer: PRIVATE HEALTH INSURANCE | Admitting: Psychology

## 2017-01-15 DIAGNOSIS — F319 Bipolar disorder, unspecified: Secondary | ICD-10-CM

## 2017-02-05 ENCOUNTER — Ambulatory Visit (INDEPENDENT_AMBULATORY_CARE_PROVIDER_SITE_OTHER): Payer: PRIVATE HEALTH INSURANCE | Admitting: Psychology

## 2017-02-05 DIAGNOSIS — F319 Bipolar disorder, unspecified: Secondary | ICD-10-CM

## 2017-02-12 ENCOUNTER — Ambulatory Visit: Payer: PRIVATE HEALTH INSURANCE | Admitting: Psychology

## 2017-02-19 ENCOUNTER — Ambulatory Visit (INDEPENDENT_AMBULATORY_CARE_PROVIDER_SITE_OTHER): Payer: PRIVATE HEALTH INSURANCE | Admitting: Psychology

## 2017-02-19 DIAGNOSIS — F319 Bipolar disorder, unspecified: Secondary | ICD-10-CM | POA: Diagnosis not present

## 2017-02-26 ENCOUNTER — Ambulatory Visit (INDEPENDENT_AMBULATORY_CARE_PROVIDER_SITE_OTHER): Payer: PRIVATE HEALTH INSURANCE | Admitting: Psychology

## 2017-02-26 DIAGNOSIS — F319 Bipolar disorder, unspecified: Secondary | ICD-10-CM

## 2017-02-28 ENCOUNTER — Ambulatory Visit (INDEPENDENT_AMBULATORY_CARE_PROVIDER_SITE_OTHER): Payer: 59 | Admitting: Pulmonary Disease

## 2017-02-28 ENCOUNTER — Encounter: Payer: Self-pay | Admitting: Pulmonary Disease

## 2017-02-28 VITALS — BP 136/80 | HR 100 | Ht 65.0 in | Wt 333.2 lb

## 2017-02-28 DIAGNOSIS — Z9989 Dependence on other enabling machines and devices: Secondary | ICD-10-CM | POA: Diagnosis not present

## 2017-02-28 DIAGNOSIS — J9611 Chronic respiratory failure with hypoxia: Secondary | ICD-10-CM

## 2017-02-28 DIAGNOSIS — G4733 Obstructive sleep apnea (adult) (pediatric): Secondary | ICD-10-CM

## 2017-02-28 NOTE — Assessment & Plan Note (Signed)
CPAP 15 cm seems to be working well. She seems to have lost weight and that she is able to maintain this, may require lower pressure in the future  Weight loss encouraged, compliance with goal of at least 4-6 hrs every night is the expectation. Advised against medications with sedative side effects Cautioned against driving when sleepy - understanding that sleepiness will vary on a day to day basis

## 2017-02-28 NOTE — Assessment & Plan Note (Signed)
We will check nocturnal oximetry on CPAP/room air and advised about ongoing need for oxygen. She had questions about her upcoming trip in April to [place]  Due to elevation

## 2017-02-28 NOTE — Patient Instructions (Addendum)
Check nocturnal oximetry on CPAP/room air Do not use oxygen on the night of test Congratulations on weight loss

## 2017-02-28 NOTE — Progress Notes (Signed)
   Subjective:    Patient ID: [name], female    DOB: 03-10-1970, 47 y.o.   MRN: [id number]  HPI  47 yo never smoker with morbid obesity for FU of obstructive sleep apnea on CPAP 13 cm, obesity hypoventilation & 'asthma'  She also has DM-2 & bipolar -Sees Dr Toy Care    Prior  dx with asthma has been tx in past with advair , theophylline.   Her breathing has been stable, fluid status is under control with Lasix in fact has cut down to 1 tablet twice daily. She has lost about 30 pounds from 2017.  Combination of diet and exercise. No problems with CPAP mask or pressure.  She seems to be tolerating this well and is resting well.  She had relapse of her mania and medications have been added by psychiatry. Normal was reviewed which shows excellent control of events on 15 cm with mild leak and good compliance  She has an upcoming visit in April for 2015 nursery to [place], she is concerned about the elevation and inquires about the need for oxygen      Significant tests/ events  04/2010 ABG was 7.40/49/63 on 4 L [place]. [name]   PSG  2002 >> severe OSA  with AHI 40/h, lowest desatn to 52% >> 8 cm H2O - wt 325 lbs   CPAP titration study >> required 10-16cm CPAP, higher pressure when supine/ REM sleep.    PFTs 06/2011 - no obstruction, mild restriction, FVC 78%, DLCO 64%    Review of Systems neg for any significant sore throat, dysphagia, itching, sneezing, nasal congestion or excess/ purulent secretions, fever, chills, sweats, unintended wt loss, pleuritic or exertional cp, hempoptysis, orthopnea pnd or change in chronic leg swelling. Also denies presyncope, palpitations, heartburn, abdominal pain, nausea, vomiting, diarrhea or change in bowel or urinary habits, dysuria,hematuria, rash, arthralgias, visual complaints, headache, numbness weakness or ataxia.     Objective:   Physical Exam  Gen. Pleasant, obese, in no distress ENT - no lesions, no post nasal drip Neck: No JVD,  no thyromegaly, no carotid bruits Lungs: no use of accessory muscles, no dullness to percussion, decreased without rales or rhonchi  Cardiovascular: Rhythm regular, heart sounds  normal, no murmurs or gallops, no peripheral edema Musculoskeletal: No deformities, no cyanosis or clubbing , no tremors       Assessment & Plan:

## 2017-03-05 ENCOUNTER — Ambulatory Visit (INDEPENDENT_AMBULATORY_CARE_PROVIDER_SITE_OTHER): Payer: PRIVATE HEALTH INSURANCE | Admitting: Psychology

## 2017-03-05 DIAGNOSIS — F319 Bipolar disorder, unspecified: Secondary | ICD-10-CM | POA: Diagnosis not present

## 2017-03-12 ENCOUNTER — Ambulatory Visit (INDEPENDENT_AMBULATORY_CARE_PROVIDER_SITE_OTHER): Payer: PRIVATE HEALTH INSURANCE | Admitting: Psychology

## 2017-03-12 DIAGNOSIS — F319 Bipolar disorder, unspecified: Secondary | ICD-10-CM

## 2017-03-17 ENCOUNTER — Other Ambulatory Visit: Payer: Self-pay | Admitting: Cardiology

## 2017-03-19 ENCOUNTER — Ambulatory Visit: Payer: PRIVATE HEALTH INSURANCE | Admitting: Psychology

## 2017-03-21 ENCOUNTER — Telehealth: Payer: Self-pay | Admitting: Pulmonary Disease

## 2017-03-21 NOTE — Telephone Encounter (Signed)
Pt is requesting ONO results.  ONO has been placed in RA's cubby for review.  Pt states at her last OV her spO2 was at 92%. Pt states that the nurse (unsure of name) stated that spO2 was lower then normal due to the cold weather. Pt would like this documented within last OV note.   RA please advise on ONO results.

## 2017-03-26 ENCOUNTER — Ambulatory Visit (INDEPENDENT_AMBULATORY_CARE_PROVIDER_SITE_OTHER): Payer: PRIVATE HEALTH INSURANCE | Admitting: Psychology

## 2017-03-26 DIAGNOSIS — F319 Bipolar disorder, unspecified: Secondary | ICD-10-CM

## 2017-03-28 NOTE — Telephone Encounter (Signed)
Oxygen level stayed well on CPAP/room air. Can discontinue oxygen and see how she feels for a month

## 2017-03-28 NOTE — Telephone Encounter (Signed)
Spoke with patient. She is aware of results. She will try to use CPAP machine without oxygen for one month and call back to report how she is feeling.   Reminder has been placed in patient's chart so I can call and follow up on patient as well.   Nothing else needed at time of call.

## 2017-03-28 NOTE — Telephone Encounter (Signed)
Dr. Alva please advise. Thanks. 

## 2017-04-02 ENCOUNTER — Ambulatory Visit (INDEPENDENT_AMBULATORY_CARE_PROVIDER_SITE_OTHER): Payer: PRIVATE HEALTH INSURANCE | Admitting: Psychology

## 2017-04-02 DIAGNOSIS — F319 Bipolar disorder, unspecified: Secondary | ICD-10-CM | POA: Diagnosis not present

## 2017-04-06 ENCOUNTER — Other Ambulatory Visit: Payer: Self-pay | Admitting: Family Medicine

## 2017-04-09 ENCOUNTER — Ambulatory Visit (INDEPENDENT_AMBULATORY_CARE_PROVIDER_SITE_OTHER): Payer: PRIVATE HEALTH INSURANCE | Admitting: Psychology

## 2017-04-09 DIAGNOSIS — F319 Bipolar disorder, unspecified: Secondary | ICD-10-CM | POA: Diagnosis not present

## 2017-04-16 ENCOUNTER — Ambulatory Visit (INDEPENDENT_AMBULATORY_CARE_PROVIDER_SITE_OTHER): Payer: PRIVATE HEALTH INSURANCE | Admitting: Psychology

## 2017-04-16 DIAGNOSIS — F319 Bipolar disorder, unspecified: Secondary | ICD-10-CM | POA: Diagnosis not present

## 2017-04-23 ENCOUNTER — Ambulatory Visit: Payer: PRIVATE HEALTH INSURANCE | Admitting: Psychology

## 2017-04-24 ENCOUNTER — Ambulatory Visit (INDEPENDENT_AMBULATORY_CARE_PROVIDER_SITE_OTHER): Payer: PRIVATE HEALTH INSURANCE | Admitting: Psychology

## 2017-04-24 DIAGNOSIS — F319 Bipolar disorder, unspecified: Secondary | ICD-10-CM

## 2017-04-30 ENCOUNTER — Ambulatory Visit: Payer: 59 | Admitting: Psychology

## 2017-04-30 ENCOUNTER — Ambulatory Visit: Payer: PRIVATE HEALTH INSURANCE | Admitting: Psychology

## 2017-05-07 ENCOUNTER — Ambulatory Visit (INDEPENDENT_AMBULATORY_CARE_PROVIDER_SITE_OTHER): Payer: PRIVATE HEALTH INSURANCE | Admitting: Psychology

## 2017-05-07 DIAGNOSIS — F319 Bipolar disorder, unspecified: Secondary | ICD-10-CM | POA: Diagnosis not present

## 2017-05-08 ENCOUNTER — Other Ambulatory Visit: Payer: Self-pay | Admitting: Family Medicine

## 2017-05-08 NOTE — Telephone Encounter (Signed)
Called pt left a message to return my call in the office regarding scheduling an OV for her Diabetes medication refill.

## 2017-05-09 NOTE — Telephone Encounter (Signed)
Rx done. 

## 2017-05-14 ENCOUNTER — Ambulatory Visit (INDEPENDENT_AMBULATORY_CARE_PROVIDER_SITE_OTHER): Payer: PRIVATE HEALTH INSURANCE | Admitting: Psychology

## 2017-05-14 ENCOUNTER — Telehealth: Payer: Self-pay | Admitting: Pulmonary Disease

## 2017-05-14 DIAGNOSIS — F319 Bipolar disorder, unspecified: Secondary | ICD-10-CM | POA: Diagnosis not present

## 2017-05-14 NOTE — Telephone Encounter (Signed)
Calling to check on patient since she has been using her CPAP machine with O2.   Left message for patient to call back.

## 2017-05-15 ENCOUNTER — Telehealth: Payer: Self-pay | Admitting: Family Medicine

## 2017-05-15 NOTE — Telephone Encounter (Signed)
Copied from [place] [id number]. Topic: Inquiry >> May 15, 2017 10:12 AM Conception Chancy, NT wrote: Reason for CRM: patient is calling and states she is aware she needs to make a follow up appt but she is going out of town for 3 weeks and is not sure when she will be back. But states she is doing well and would just like Dr. Maudie Mercury to know.

## 2017-05-16 NOTE — Telephone Encounter (Signed)
Okay to discontinue oxygen after she gets back

## 2017-05-16 NOTE — Telephone Encounter (Signed)
ATC pt, no answer. Left message for pt to call back.  

## 2017-05-16 NOTE — Telephone Encounter (Signed)
Patient returned call Patient has been using her CPAP without the O2 The only difference is that the air coming through her CPAP is not cold, which she likes Not having any shortness of breath She checks sats when she wakes up = 95-98% Usually does free breathing meditation after she wakes up  Patient is leaving to go out of the country for 15 days - she doesn't want her O2 concentrator removed until after she gets back on May 3rd.    Pt apologized for being remiss in getting back in touch with the office Will route to RA with update provided by patient

## 2017-05-21 ENCOUNTER — Ambulatory Visit: Payer: Medicare Other | Admitting: Psychology

## 2017-05-31 ENCOUNTER — Other Ambulatory Visit: Payer: Self-pay | Admitting: Family Medicine

## 2017-06-04 ENCOUNTER — Ambulatory Visit (INDEPENDENT_AMBULATORY_CARE_PROVIDER_SITE_OTHER): Payer: PRIVATE HEALTH INSURANCE | Admitting: Psychology

## 2017-06-04 DIAGNOSIS — F319 Bipolar disorder, unspecified: Secondary | ICD-10-CM | POA: Diagnosis not present

## 2017-06-05 ENCOUNTER — Other Ambulatory Visit: Payer: Self-pay | Admitting: Cardiology

## 2017-06-06 ENCOUNTER — Telehealth: Payer: Self-pay | Admitting: Pulmonary Disease

## 2017-06-06 DIAGNOSIS — J9611 Chronic respiratory failure with hypoxia: Secondary | ICD-10-CM

## 2017-06-06 NOTE — Telephone Encounter (Signed)
Please see 05/14/17 phone note. Pt is requesting that order now be placed to [facility] to d/c O2, as she is back in the [place].  Order has been placed. Nothing further is needed.    Rigoberto Noel, MD  to [facility] Triage Pool    1:19 PM  Note    Okay to discontinue oxygen after she gets back

## 2017-06-11 ENCOUNTER — Ambulatory Visit: Payer: Medicare Other | Admitting: Psychology

## 2017-06-18 ENCOUNTER — Ambulatory Visit (INDEPENDENT_AMBULATORY_CARE_PROVIDER_SITE_OTHER): Payer: PRIVATE HEALTH INSURANCE | Admitting: Psychology

## 2017-06-18 DIAGNOSIS — F319 Bipolar disorder, unspecified: Secondary | ICD-10-CM | POA: Diagnosis not present

## 2017-06-25 ENCOUNTER — Ambulatory Visit: Payer: Medicare Other | Admitting: Psychology

## 2017-06-26 ENCOUNTER — Ambulatory Visit (INDEPENDENT_AMBULATORY_CARE_PROVIDER_SITE_OTHER): Payer: PRIVATE HEALTH INSURANCE | Admitting: Psychology

## 2017-06-26 DIAGNOSIS — F319 Bipolar disorder, unspecified: Secondary | ICD-10-CM | POA: Diagnosis not present

## 2017-07-02 ENCOUNTER — Ambulatory Visit (INDEPENDENT_AMBULATORY_CARE_PROVIDER_SITE_OTHER): Payer: PRIVATE HEALTH INSURANCE | Admitting: Psychology

## 2017-07-02 DIAGNOSIS — F319 Bipolar disorder, unspecified: Secondary | ICD-10-CM | POA: Diagnosis not present

## 2017-07-04 ENCOUNTER — Ambulatory Visit: Payer: Self-pay | Admitting: Family Medicine

## 2017-07-09 ENCOUNTER — Ambulatory Visit (INDEPENDENT_AMBULATORY_CARE_PROVIDER_SITE_OTHER): Payer: PRIVATE HEALTH INSURANCE | Admitting: Psychology

## 2017-07-09 DIAGNOSIS — F319 Bipolar disorder, unspecified: Secondary | ICD-10-CM | POA: Diagnosis not present

## 2017-07-10 NOTE — Progress Notes (Signed)
HPI:  Using dictation device. Unfortunately this device frequently misinterprets words/phrases.  [name] is a pleasant 47 y.o. here for follow up. Chronic medical problems summarized below were reviewed for changes and stability and were updated as needed below. These issues and their treatment remain stable for the most part. Reports feels great. No concerns. Reports mood is good. Denies CP, SOB, DOE, treatment intolerance or new symptoms.   Morbid Obesity: -significant lifestyle changes in 2018 with 30 lb wt reduction, felt much better -wt 361 10/17 --> 338 7/18 --> 338 6/19  DM: -meds:metformin, arb, lipitor  HTN/HLD, Cardiomyopathy, hx cor pulmonale, hx CHF: -sees cardiologist for management, Dr. Percival Spanish  Obesity hypoventilation syndrome, ? Asthma, seasonal allergies, OSA: -sees pulmonology for management, Dr. Davonna Belling -on CPAP -meds: flonase, alb prn  Bipolar d/o, severe anxiety: -managed by her psychiatrist -complex and extensive medical regimen  Sees Dr. Quincy Simmonds for gyn exams.  ROS: See pertinent positives and negatives per HPI.  Past Medical History:  Diagnosis Date  . Abrasion of breast 06/21/2011   left  . Acute systolic congestive heart failure ([place])   . Agoraphobia   . Allergy   . Anxiety    panic attacks  . Arthritis    osteoarthritis bilal. knees  . Asthma    no per PFT 7/13; reports does not have asthma  . Bipolar disorder ([place])   . Carpal tunnel syndrome of left wrist 05/2011   Being evaluated for MS  . Cor pulmonale ([place])   . Depression   . History of pleural effusion   . Hyperlipidemia   . Hypertension   . Hypoxemia    history of - no home O2  . IBS (irritable bowel syndrome)   . Morbid obesity ([place])   . Obesity hypoventilation syndrome ([place])   . Prediabetes   . Seasonal allergies    current runny nose  . Seizures ([place])    febrile seizure x 1 as a child. Several times  . Sleep apnea sleep study 07/02/2010   uses CPAP nightly  .  Spinal stenosis in cervical region   . Urinary incontinence     Past Surgical History:  Procedure Laterality Date  . CARPAL TUNNEL RELEASE  06/27/2011   Procedure: CARPAL TUNNEL RELEASE;  Surgeon: Wynonia Sours, MD;  Location: [place];  Service: Orthopedics;  Laterality: Left;  . CARPAL TUNNEL RELEASE  12/19/2011   Procedure: CARPAL TUNNEL RELEASE;  Surgeon: Wynonia Sours, MD;  Location: [facility];  Service: Orthopedics;  Laterality: Right;  . MASS EXCISION  08/07/2010   right index  . POSTERIOR CERVICAL FUSION/FORAMINOTOMY  09/11/2011   Procedure: POSTERIOR CERVICAL FUSION/FORAMINOTOMY LEVEL 1;  Surgeon: Erline Levine, MD;  Location: [facility];  Service: Neurosurgery;  Laterality: N/A;  Cervical Three-Four Posteior Cervical Fusion and Decompression.  [name] TRIGGER FINGER RELEASE  12/19/2011   Procedure: RELEASE TRIGGER FINGER/A-1 PULLEY;  Surgeon: Wynonia Sours, MD;  Location: [facility];  Service: Orthopedics;  Laterality: Right;  . TRIGGER FINGER RELEASE Left 12/31/2012   Procedure: RELEASE A-1 PULLEY LEFT THUMB;  Surgeon: Wynonia Sours, MD;  Location: [facility];  Service: Orthopedics;  Laterality: Left;  . WISDOM TOOTH EXTRACTION      Family History  Problem Relation Age of Onset  . Asthma Mother   . Allergic rhinitis Mother   . Multiple sclerosis Mother   . Asthma Maternal Grandmother     SOCIAL HX: see hpi  Current Outpatient Medications:  .  acetaminophen (TYLENOL) 500 MG tablet, Take 2,000 mg by mouth 2 (two) times daily. Taking every day per patient, Disp: , Rfl:  .  albuterol (PROVENTIL HFA;VENTOLIN HFA) 108 (90 Base) MCG/ACT inhaler, Inhale 2 puffs into the lungs every 6 (six) hours as needed for wheezing or shortness of breath., Disp: 1 Inhaler, Rfl: 3 .  ALPRAZolam (XANAX) 1 MG tablet, Take 1 mg by mouth 3 (three) times daily. , Disp: , Rfl:  .  Ascorbic Acid (VITAMIN C) 500 MG tablet, Take 1,000 mg by mouth  daily. , Disp: , Rfl:  .  aspirin 81 MG tablet, Take 81 mg by mouth daily. , Disp: , Rfl:  .  atorvastatin (LIPITOR) 20 MG tablet, Take 1 tablet (20 mg total) by mouth every evening., Disp: 90 tablet, Rfl: 3 .  b complex vitamins tablet, Take 1 tablet by mouth daily., Disp: , Rfl:  .  Cholecalciferol (VITAMIN D3) 5000 UNITS TABS, Take 3 tablets by mouth daily. , Disp: , Rfl:  .  Chromium Picolinate 800 MCG TABS, Take 800 mcg by mouth daily., Disp: , Rfl:  .  clonazePAM (KLONOPIN) 1 MG tablet, Take 1 tablet (1 mg total) by mouth 3 (three) times daily., Disp: 30 tablet, Rfl: 0 .  fluticasone (FLONASE) 50 MCG/ACT nasal spray, Place 1 spray into both nostrils daily., Disp: 16 g, Rfl: 6 .  furosemide (LASIX) 40 MG tablet, Take 2 tablets (80 mg total) by mouth 2 (two) times daily., Disp: 360 tablet, Rfl: 3 .  KLOR-CON M20 20 MEQ tablet, TAKE 1 TABLET DAILY, Disp: 90 tablet, Rfl: 0 .  lamoTRIgine (LAMICTAL) 200 MG tablet, Take 200 mg by mouth 2 (two) times daily. , Disp: , Rfl:  .  lidocaine (LIDODERM) 5 %, Place 1 patch onto the skin daily as needed (for knee pain). Remove & Discard patch within 12 hours or as directed by MD, Disp: 30 patch, Rfl: 0 .  losartan (COZAAR) 25 MG tablet, Take 1 tablet (25 mg total) by mouth 2 (two) times daily., Disp: 180 tablet, Rfl: 3 .  Lurasidone HCl 60 MG TABS, Take 20 mg by mouth daily. , Disp: , Rfl:  .  LYRICA 300 MG capsule, Take 1 tablet by mouth Twice daily., Disp: , Rfl:  .  MAGNESIUM PO, Take 1 tablet by mouth daily., Disp: , Rfl:  .  Meloxicam (MOBIC PO), Take 7.5 mg by mouth every 12 (twelve) hours. , Disp: , Rfl:  .  metFORMIN (GLUCOPHAGE) 500 MG tablet, TAKE 1 TABLET BY MOUTH TWICE A DAY WITH MEALS, Disp: 60 tablet, Rfl: 0 .  Multiple Vitamin (MULTIVITAMIN) tablet, Take 1 tablet by mouth daily.  , Disp: , Rfl:  .  norethindrone-ethinyl estradiol-iron (MICROGESTIN FE 1.5/30) 1.5-30 MG-MCG tablet, Take 1 tablet by mouth at bedtime. Takes continuously, Disp: ,  Rfl:  .  NUEDEXTA 20-10 MG CAPS, Take 1 tablet by mouth every 12 (twelve) hours., Disp: , Rfl: 12 .  potassium chloride SA (KLOR-CON M20) 20 MEQ tablet, Take 1 tablet (20 mEq total) by mouth daily., Disp: 90 tablet, Rfl: 0 .  VRAYLAR 6 MG CAPS, Take 1 tablet by mouth every evening., Disp: , Rfl:  .  Zinc 50 MG CAPS, Take 50 mg by mouth daily., Disp: , Rfl:   EXAM:  Vitals:   07/11/17 1500  BP: 110/64  Pulse: 75  Temp: 98.6 F (37 C)    Body mass index is 56.25 kg/m.  GENERAL: vitals  reviewed and listed above, alert, oriented, appears well hydrated and in no acute distress  HEENT: atraumatic, conjunttiva clear, no obvious abnormalities on inspection of external nose and ears  NECK: no obvious masses on inspection  LUNGS: clear to auscultation bilaterally, no wheezes, rales or rhonchi, good air movement  CV: HRRR, no peripheral edema  MS: moves all extremities without noticeable abnormality  PSYCH: pleasant and cooperative, no obvious depression or anxiety  ASSESSMENT AND PLAN:  Discussed the following assessment and plan:  Type 2 diabetes mellitus with complication, without long-term current use of insulin ([place]) - Plan: Hemoglobin A1c  Cardiomyopathy, unspecified type ([place])  Hypertension associated with diabetes ([place]) - Plan: Basic metabolic panel, CBC  Hyperlipidemia associated with type 2 diabetes mellitus ([place]) - Plan: Lipid panel  -labs per orders, she is fasting today -lifestyle recs -follow up 3-4 months -foot exam done -pt reports eye exam done - advised assistant to absract -Patient advised to return or notify a doctor immediately if symptoms worsen or persist or new concerns arise.  Patient Instructions  BEFORE YOU LEAVE: -Wendie Simmer, please obtain and abstract eye exam -labs -follow up: 4 months  We have ordered labs or studies at this visit. It can take up to 1-2 weeks for results and processing. IF results require follow up or explanation, we will  call you with instructions. Clinically stable results will be released to your [facility]. If you have not heard from [place] or cannot find your results in [facility] in 2 weeks please contact our office at [phone].  If you are not yet signed up for [facility], please consider signing up.    We recommend the following healthy lifestyle for LIFE: 1) Small portions. But, make sure to get regular (at least 3 per day), healthy meals and small healthy snacks if needed.  2) Eat a healthy clean diet.   TRY TO EAT: -at least 5-7 servings of low sugar, colorful, and nutrient rich vegetables per day (not corn, potatoes or bananas.) -berries are the best choice if you wish to eat fruit (only eat small amounts if trying to reduce weight)  -lean meets (fish, white meat of chicken or [place]) -vegan proteins for some meals - beans or tofu, whole grains, nuts and seeds -Replace bad fats with good fats - good fats include: fish, nuts and seeds, canola oil, olive oil -small amounts of low fat or non fat dairy -small amounts of100 % whole grains - check the lables -drink plenty of water  AVOID: -SUGAR, sweets, anything with added sugar, corn syrup or sweeteners - must read labels as even foods advertised as "healthy" often are loaded with sugar -if you must have a sweetener, small amounts of stevia may be best -sweetened beverages and artificially sweetened beverages -simple starches (rice, bread, potatoes, pasta, chips, etc - small amounts of 100% whole grains are ok) -red meat, pork, butter -fried foods, fast food, processed food, excessive dairy, eggs and coconut.  3)Get at least 150 minutes of sweaty aerobic exercise per week.  4)Reduce stress - consider counseling, meditation and relaxation to balance other aspects of your life.         Lucretia Kern, DO

## 2017-07-11 ENCOUNTER — Encounter: Payer: Self-pay | Admitting: Family Medicine

## 2017-07-11 ENCOUNTER — Ambulatory Visit (INDEPENDENT_AMBULATORY_CARE_PROVIDER_SITE_OTHER): Payer: 59 | Admitting: Family Medicine

## 2017-07-11 VITALS — BP 110/64 | HR 75 | Temp 98.6°F | Wt 338.0 lb

## 2017-07-11 DIAGNOSIS — I10 Essential (primary) hypertension: Secondary | ICD-10-CM | POA: Diagnosis not present

## 2017-07-11 DIAGNOSIS — I429 Cardiomyopathy, unspecified: Secondary | ICD-10-CM | POA: Diagnosis not present

## 2017-07-11 DIAGNOSIS — E1169 Type 2 diabetes mellitus with other specified complication: Secondary | ICD-10-CM

## 2017-07-11 DIAGNOSIS — E1159 Type 2 diabetes mellitus with other circulatory complications: Secondary | ICD-10-CM

## 2017-07-11 DIAGNOSIS — E118 Type 2 diabetes mellitus with unspecified complications: Secondary | ICD-10-CM | POA: Diagnosis not present

## 2017-07-11 DIAGNOSIS — I152 Hypertension secondary to endocrine disorders: Secondary | ICD-10-CM

## 2017-07-11 DIAGNOSIS — E785 Hyperlipidemia, unspecified: Secondary | ICD-10-CM | POA: Diagnosis not present

## 2017-07-11 NOTE — Patient Instructions (Addendum)
BEFORE YOU LEAVE: Wendie Simmer, please obtain and abstract eye exam -labs -follow up: 4 months  We have ordered labs or studies at this visit. It can take up to 1-2 weeks for results and processing. IF results require follow up or explanation, we will call you with instructions. Clinically stable results will be released to your [facility]. If you have not heard from [place] or cannot find your results in [facility] in 2 weeks please contact our office at [phone].  If you are not yet signed up for [facility], please consider signing up.    We recommend the following healthy lifestyle for LIFE: 1) Small portions. But, make sure to get regular (at least 3 per day), healthy meals and small healthy snacks if needed.  2) Eat a healthy clean diet.   TRY TO EAT: -at least 5-7 servings of low sugar, colorful, and nutrient rich vegetables per day (not corn, potatoes or bananas.) -berries are the best choice if you wish to eat fruit (only eat small amounts if trying to reduce weight)  -lean meets (fish, white meat of chicken or [place]) -vegan proteins for some meals - beans or tofu, whole grains, nuts and seeds -Replace bad fats with good fats - good fats include: fish, nuts and seeds, canola oil, olive oil -small amounts of low fat or non fat dairy -small amounts of100 % whole grains - check the lables -drink plenty of water  AVOID: -SUGAR, sweets, anything with added sugar, corn syrup or sweeteners - must read labels as even foods advertised as "healthy" often are loaded with sugar -if you must have a sweetener, small amounts of stevia may be best -sweetened beverages and artificially sweetened beverages -simple starches (rice, bread, potatoes, pasta, chips, etc - small amounts of 100% whole grains are ok) -red meat, pork, butter -fried foods, fast food, processed food, excessive dairy, eggs and coconut.  3)Get at least 150 minutes of sweaty aerobic exercise per week.  4)Reduce stress - consider  counseling, meditation and relaxation to balance other aspects of your life.

## 2017-07-12 LAB — CBC
HEMATOCRIT: 44.2 % (ref 36.0–46.0)
HEMOGLOBIN: 14.6 g/dL (ref 12.0–15.0)
MCHC: 32.9 g/dL (ref 30.0–36.0)
MCV: 97.1 fl (ref 78.0–100.0)
Platelets: 228 10*3/uL (ref 150.0–400.0)
RBC: 4.55 Mil/uL (ref 3.87–5.11)
RDW: 14 % (ref 11.5–15.5)
WBC: 7.5 10*3/uL (ref 4.0–10.5)

## 2017-07-12 LAB — LIPID PANEL
CHOL/HDL RATIO: 2
Cholesterol: 159 mg/dL (ref 0–200)
HDL: 63.8 mg/dL (ref >39.00)
LDL Cholesterol: 76 mg/dL (ref 0–99)
NONHDL: 95.3
Triglycerides: 98 mg/dL (ref 0.0–149.0)
VLDL: 19.6 mg/dL (ref 0.0–40.0)

## 2017-07-12 LAB — BASIC METABOLIC PANEL
BUN: 19 mg/dL (ref 6–23)
CHLORIDE: 102 meq/L (ref 96–112)
CO2: 33 meq/L — AB (ref 19–32)
CREATININE: 1 mg/dL (ref 0.40–1.20)
Calcium: 9.5 mg/dL (ref 8.4–10.5)
GFR: 62.99 mL/min (ref >60.00)
Glucose, Bld: 91 mg/dL (ref 70–99)
POTASSIUM: 4.9 meq/L (ref 3.5–5.1)
Sodium: 143 mEq/L (ref 135–145)

## 2017-07-12 LAB — HEMOGLOBIN A1C: HEMOGLOBIN A1C: 6.3 % (ref 4.6–6.5)

## 2017-07-16 ENCOUNTER — Ambulatory Visit (INDEPENDENT_AMBULATORY_CARE_PROVIDER_SITE_OTHER): Payer: PRIVATE HEALTH INSURANCE | Admitting: Psychology

## 2017-07-16 DIAGNOSIS — F319 Bipolar disorder, unspecified: Secondary | ICD-10-CM

## 2017-07-23 ENCOUNTER — Ambulatory Visit (INDEPENDENT_AMBULATORY_CARE_PROVIDER_SITE_OTHER): Payer: PRIVATE HEALTH INSURANCE | Admitting: Psychology

## 2017-07-23 ENCOUNTER — Encounter: Payer: Self-pay | Admitting: Cardiology

## 2017-07-23 DIAGNOSIS — F319 Bipolar disorder, unspecified: Secondary | ICD-10-CM | POA: Diagnosis not present

## 2017-07-23 NOTE — Progress Notes (Signed)
HPI This patient presents for evaluation of a mildly reduced ejection fraction. Since I last saw her she has done well.  The patient denies any new symptoms such as chest discomfort, neck or arm discomfort. There has been no new shortness of breath, PND or orthopnea. There have been no reported palpitations, presyncope or syncope.  She was recently in [place] and did not have any problems with the altitude and smog.     Allergies  Allergen Reactions  . Savella [Milnacipran Hcl]     mania    Current Outpatient Medications  Medication Sig Dispense Refill  . acetaminophen (TYLENOL) 500 MG tablet Take 2,000 mg by mouth 2 (two) times daily. Taking every day per patient    . albuterol (PROVENTIL HFA;VENTOLIN HFA) 108 (90 Base) MCG/ACT inhaler Inhale 2 puffs into the lungs every 6 (six) hours as needed for wheezing or shortness of breath. 1 Inhaler 3  . ALPRAZolam (XANAX) 1 MG tablet Take 1 mg by mouth 3 (three) times daily.     . Ascorbic Acid (VITAMIN C) 500 MG tablet Take 1,000 mg by mouth daily.     [name] aspirin 81 MG tablet Take 81 mg by mouth daily.     [name] atorvastatin (LIPITOR) 20 MG tablet Take 1 tablet (20 mg total) by mouth every evening. 90 tablet 3  . b complex vitamins tablet Take 1 tablet by mouth daily.    . Cholecalciferol (VITAMIN D3) 5000 UNITS TABS Take 3 tablets by mouth daily.     . Chromium Picolinate 800 MCG TABS Take 800 mcg by mouth daily.    . clonazePAM (KLONOPIN) 1 MG tablet Take 1 tablet (1 mg total) by mouth 3 (three) times daily. 30 tablet 0  . fluticasone (FLONASE) 50 MCG/ACT nasal spray Place 1 spray into both nostrils daily. 16 g 6  . furosemide (LASIX) 40 MG tablet Take 2 tablets (80 mg total) by mouth 2 (two) times daily. 360 tablet 3  . KLOR-CON M20 20 MEQ tablet TAKE 1 TABLET DAILY 90 tablet 0  . lamoTRIgine (LAMICTAL) 200 MG tablet Take 200 mg by mouth 2 (two) times daily.     [name] lidocaine (LIDODERM) 5 % Place 1 patch onto the skin daily as needed (for  knee pain). Remove & Discard patch within 12 hours or as directed by MD 30 patch 0  . losartan (COZAAR) 25 MG tablet Take 1 tablet (25 mg total) by mouth 2 (two) times daily. 180 tablet 3  . LYRICA 300 MG capsule Take 1 tablet by mouth Twice daily.    [name] MAGNESIUM PO Take 1 tablet by mouth daily.    . Meloxicam (MOBIC PO) Take 7.5 mg by mouth every 12 (twelve) hours.     . metFORMIN (GLUCOPHAGE) 500 MG tablet TAKE 1 TABLET BY MOUTH TWICE A DAY WITH MEALS 60 tablet 0  . Multiple Vitamin (MULTIVITAMIN) tablet Take 1 tablet by mouth daily.      . norethindrone-ethinyl estradiol-iron (MICROGESTIN FE 1.5/30) 1.5-30 MG-MCG tablet Take 1 tablet by mouth at bedtime. Takes continuously    . NUEDEXTA 20-10 MG CAPS Take 1 tablet by mouth every 12 (twelve) hours.  12  . potassium chloride SA (KLOR-CON M20) 20 MEQ tablet Take 1 tablet (20 mEq total) by mouth daily. 90 tablet 0  . VRAYLAR 6 MG CAPS Take 1 tablet by mouth every evening.    . Zinc 50 MG CAPS Take 50 mg by mouth daily.  No current facility-administered medications for this visit.     Past Medical History:  Diagnosis Date  . Acute systolic congestive heart failure ([place])   . Agoraphobia   . Anxiety    panic attacks  . Arthritis    osteoarthritis bilal. knees  . Asthma    no per PFT 7/13; reports does not have asthma  . Bipolar disorder ([place])   . Carpal tunnel syndrome of left wrist 05/2011   Being evaluated for MS  . Cor pulmonale ([place])   . Depression   . History of pleural effusion   . Hyperlipidemia   . Hypertension   . Hypoxemia    history of - no home O2  . IBS (irritable bowel syndrome)   . Morbid obesity ([place])   . Obesity hypoventilation syndrome ([place])   . Prediabetes   . Seasonal allergies    current runny nose  . Seizures ([place])    febrile seizure x 1 as a child. Several times  . Sleep apnea sleep study 07/02/2010   uses CPAP nightly  . Spinal stenosis in cervical region   . Urinary incontinence     Past Surgical  History:  Procedure Laterality Date  . CARPAL TUNNEL RELEASE  06/27/2011   Procedure: CARPAL TUNNEL RELEASE;  Surgeon: Wynonia Sours, MD;  Location: [place];  Service: Orthopedics;  Laterality: Left;  . CARPAL TUNNEL RELEASE  12/19/2011   Procedure: CARPAL TUNNEL RELEASE;  Surgeon: Wynonia Sours, MD;  Location: [place];  Service: Orthopedics;  Laterality: Right;  . MASS EXCISION  08/07/2010   right index  . POSTERIOR CERVICAL FUSION/FORAMINOTOMY  09/11/2011   Procedure: POSTERIOR CERVICAL FUSION/FORAMINOTOMY LEVEL 1;  Surgeon: Erline Levine, MD;  Location: [place] [facility];  Service: Neurosurgery;  Laterality: N/A;  Cervical Three-Four Posteior Cervical Fusion and Decompression.  [name] TRIGGER FINGER RELEASE  12/19/2011   Procedure: RELEASE TRIGGER FINGER/A-1 PULLEY;  Surgeon: Wynonia Sours, MD;  Location: [place];  Service: Orthopedics;  Laterality: Right;  . TRIGGER FINGER RELEASE Left 12/31/2012   Procedure: RELEASE A-1 PULLEY LEFT THUMB;  Surgeon: Wynonia Sours, MD;  Location: [place];  Service: Orthopedics;  Laterality: Left;  . WISDOM TOOTH EXTRACTION      ROS:   She reports that she is in a manic phase.  Otherwise as stated in the HPI and negative for all other systems.   PHYSICAL EXAM BP 140/84   Pulse 78   Ht 5\' 5"  (1.651 m)   Wt (!) 334 lb (151.5 kg)   BMI 55.58 kg/m   GENERAL:  Well appearing NECK:  No jugular venous distention, waveform within normal limits, carotid upstroke brisk and symmetric, no bruits, no thyromegaly LUNGS:  Clear to auscultation bilaterally CHEST:  Unremarkable HEART:  PMI not displaced or sustained,S1 and S2 within normal limits, no S3, no S4, no clicks, no rubs, no murmurs ABD:  Flat, positive bowel sounds normal in frequency in pitch, no bruits, no rebound, no guarding, no midline pulsatile mass, no hepatomegaly, no splenomegaly EXT:  2 plus pulses throughout, no edema, no cyanosis no  clubbing   EKG:  Sinus rhythm, rate 78, axis within normal limits, intervals within normal limits, no acute ST-T wave.  Lab Results  Component Value Date   CHOL 159 07/11/2017   TRIG 98.0 07/11/2017   HDL 63.80 07/11/2017   LDLCALC 76 07/11/2017     ASSESSMENT AND PLAN  Cardiomyopathy - Her EF  was low normal two years ago.  This will be followed as below.   Pulmonary HTN - I will follow up with an echocardiogram.   This was moderate in 2017.   HTN -  Her BP is well controlled.  No change in therapy.   Dyslipidemia - LDL and HDL were excellent.  No change in therapy.

## 2017-07-24 ENCOUNTER — Ambulatory Visit (INDEPENDENT_AMBULATORY_CARE_PROVIDER_SITE_OTHER): Payer: 59 | Admitting: Cardiology

## 2017-07-24 ENCOUNTER — Encounter: Payer: Self-pay | Admitting: Cardiology

## 2017-07-24 VITALS — BP 140/84 | HR 78 | Ht 65.0 in | Wt 334.0 lb

## 2017-07-24 DIAGNOSIS — I10 Essential (primary) hypertension: Secondary | ICD-10-CM | POA: Diagnosis not present

## 2017-07-24 DIAGNOSIS — I272 Pulmonary hypertension, unspecified: Secondary | ICD-10-CM | POA: Diagnosis not present

## 2017-07-24 DIAGNOSIS — E785 Hyperlipidemia, unspecified: Secondary | ICD-10-CM

## 2017-07-24 NOTE — Patient Instructions (Signed)
[name] recommends that you continue on your current medications as directed. Please refer to the Current Medication list given to you today.  Your physician has requested that you have an echocardiogram. Echocardiography is a painless test that uses sound waves to create images of your heart. It provides your doctor with information about the size and shape of your heart and how well your heart's chambers and valves are working. This procedure takes approximately one hour. There are no restrictions for this procedure. >>This will be performed at our [facility] location [place], [place] [address] [phone]  Dr Percival Spanish recommends that you schedule a follow-up appointment in 12 months. You will receive a reminder letter in the mail two months in advance. If you don't receive a letter, please call our office to schedule the follow-up appointment.  If you need a refill on your cardiac medications before your next appointment, please call your pharmacy.

## 2017-07-29 IMAGING — CT CT ANGIO CHEST
2 of 7 series · 18 of 36 positions shown · IV contrast (Omni 300)
Comparison: 05/01/2010

CLINICAL DATA: Hypoxia.  Dyspnea.

EXAM:
CT ANGIOGRAPHY CHEST WITH CONTRAST
TECHNIQUE: Multidetector CT imaging of the chest was performed using the
standard protocol during bolus administration of intravenous
contrast. Multiplanar CT image reconstructions and MIPs were
obtained to evaluate the vascular anatomy.
CONTRAST:  100 mL Isovue 370 intravenous

[Series 6: pe thins · axial · 0.98mm/px · z∈[+1202,+1511]mm · 17 of 496 slices shown]
[im 27/496  lung]
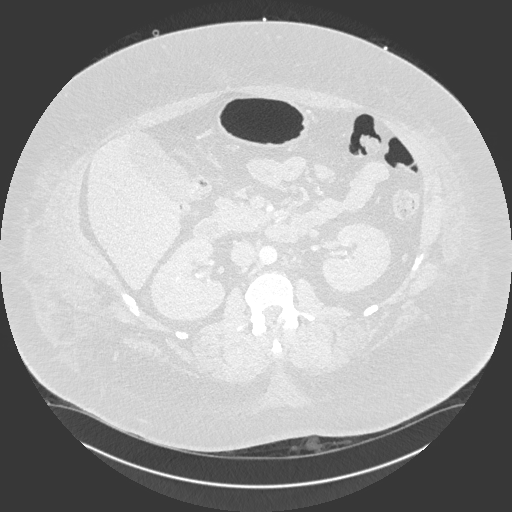
[im 53/496  mediastinal]
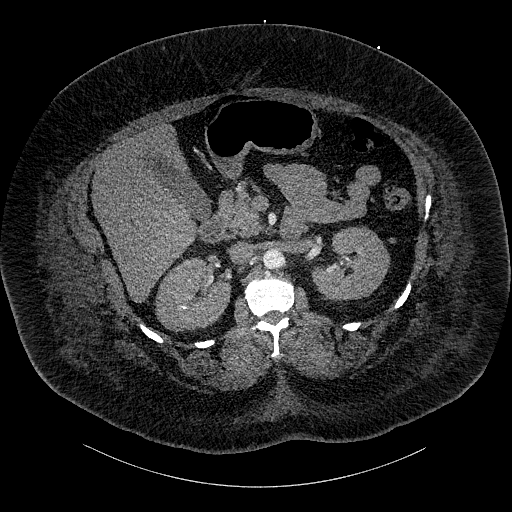
[im 79/496  lung]
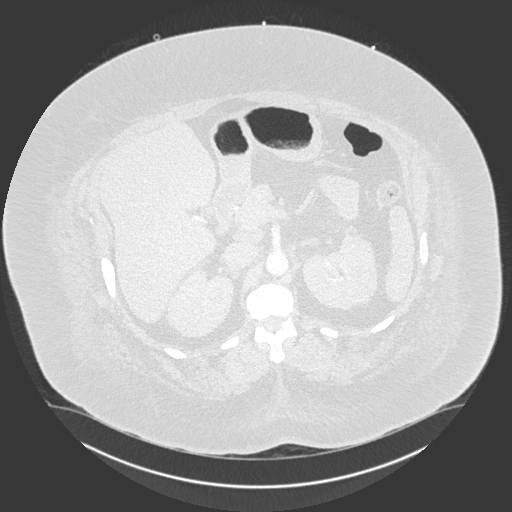
[im 105/496  mediastinal]
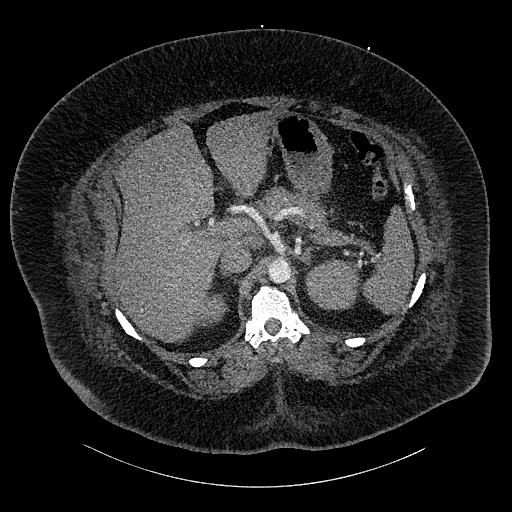
[im 131/496  lung]
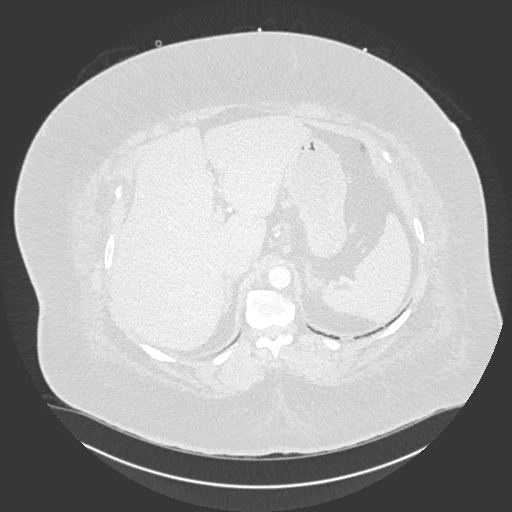
[im 157/496  mediastinal]
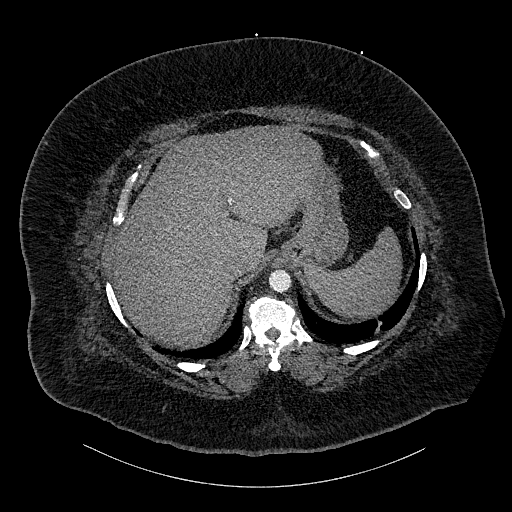
[im 183/496  lung]
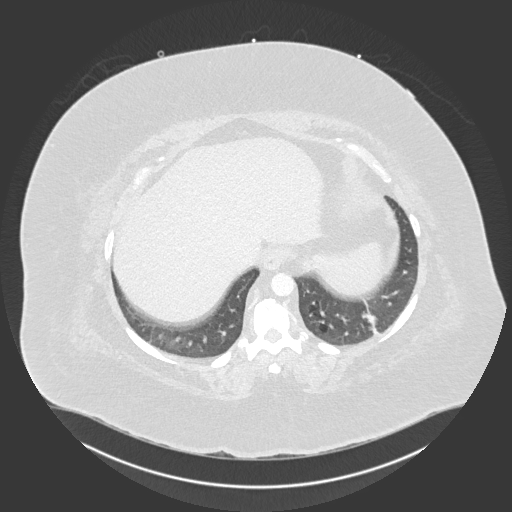
[im 209/496  mediastinal]
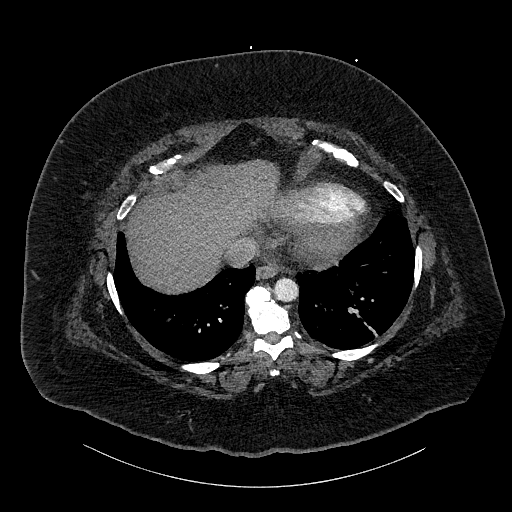
[im 261/496  lung]
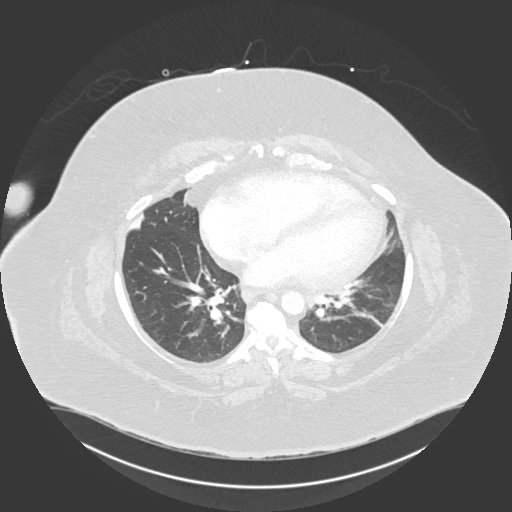
[im 287/496  mediastinal]
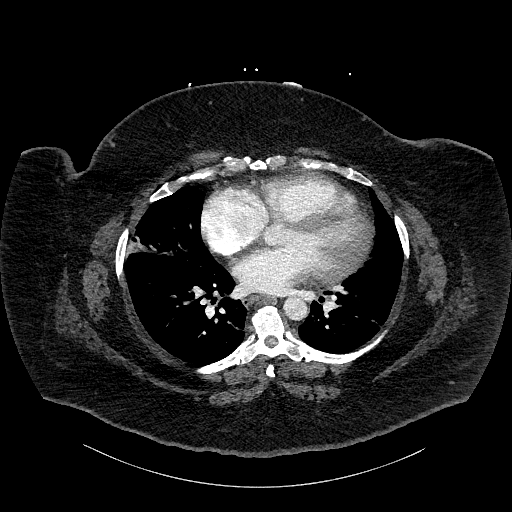
[im 313/496  lung]
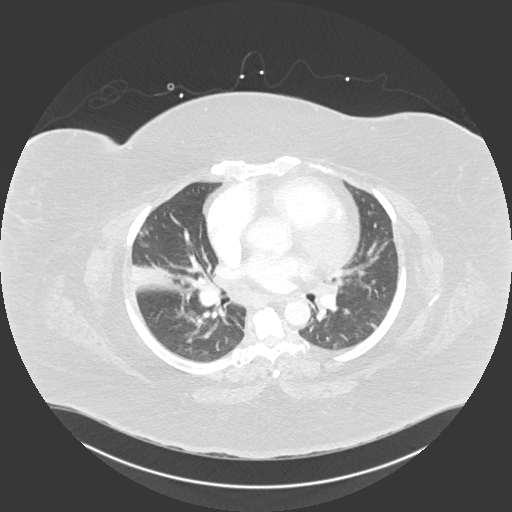
[im 339/496  mediastinal]
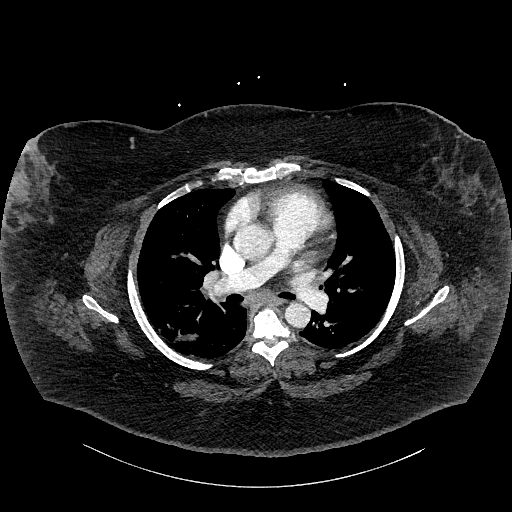
[im 365/496  lung]
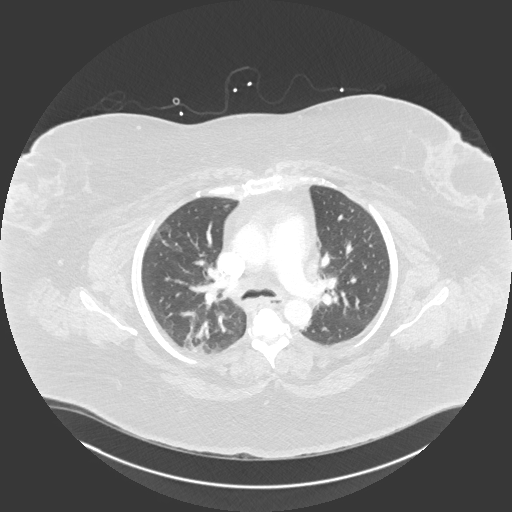
[im 391/496  mediastinal]
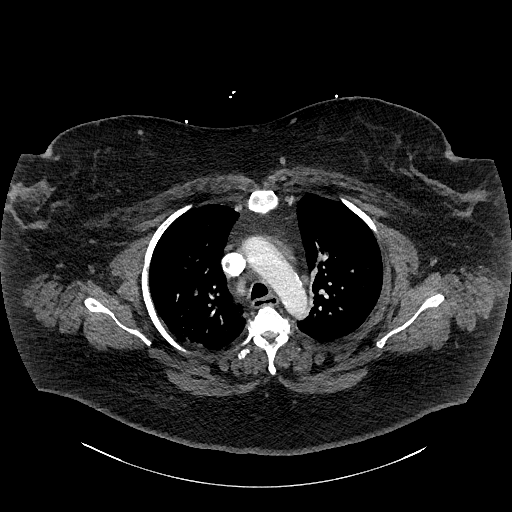
[im 417/496  lung]
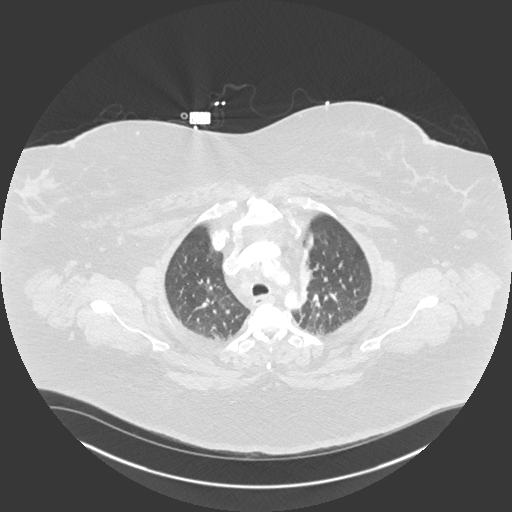
[im 443/496  mediastinal]
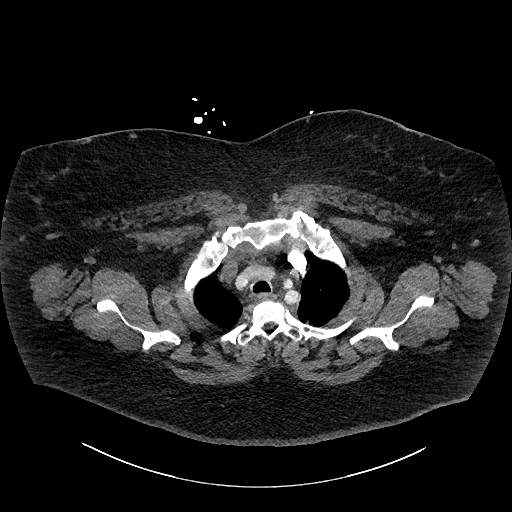
[im 469/496  lung]
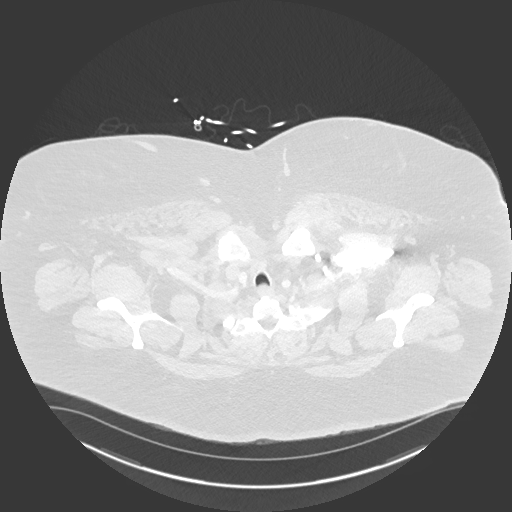

[Series 7: pe 2mm cor · coronal · 0.68mm/px · 1 of 129 slices shown]
[im 65/129  mediastinal]
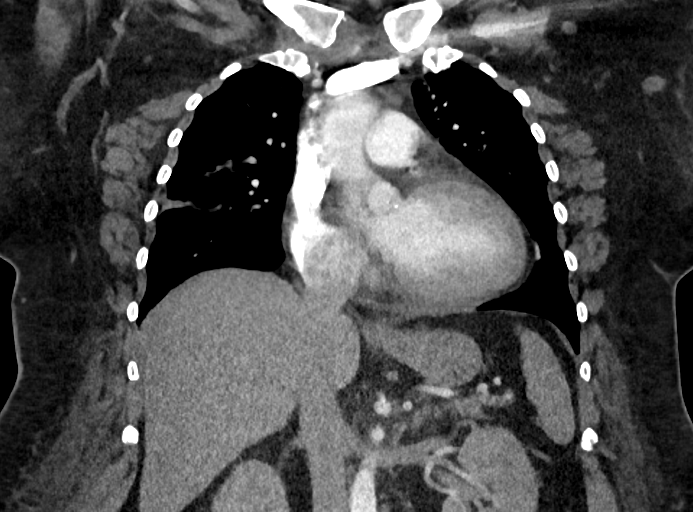

[18 of 36 positions shown; findings below may reference images not displayed]

FINDINGS: Cardiovascular: Pulmonary arterial opacification is not optimal but
it is adequate to exclude large or central pulmonary emboli. Small
subsegmental emboli may not be evident on this scan the thoracic
aorta is normal in caliber and intact.

Lungs: Stable linear scarring in the bases and right middle lobe.
Patchy airspace opacity in the right upper lobe may represent acute
infectious infiltrate although it is not characterized.

Central airways: Patent

Effusions: None

Lymphadenopathy: None. Mildly prominent nonspecific nodes in the
mediastinum and right hilum, perhaps reactive.

Esophagus: Unremarkable

Upper abdomen: No significant abnormality

Musculoskeletal: No significant skeletal lesion. Moderately severe
degenerative thoracic disc disease.

Review of the MIP images confirms the above findings.
IMPRESSION: 1. Negative for large or central pulmonary embolus. Small
subsegmental emboli may not be visible on this scan.
2. Patchy right upper lobe airspace opacity could represent
pneumonia but is not conclusively characterized. Nonspecific nodes
in the right hilum and mediastinum could be reactive. There also are
opacities in the right middle lobe and in the bases, but these are
unchanged and more likely represent scarring.

## 2017-07-30 ENCOUNTER — Ambulatory Visit (INDEPENDENT_AMBULATORY_CARE_PROVIDER_SITE_OTHER): Payer: PRIVATE HEALTH INSURANCE | Admitting: Psychology

## 2017-07-30 DIAGNOSIS — F319 Bipolar disorder, unspecified: Secondary | ICD-10-CM

## 2017-08-06 ENCOUNTER — Ambulatory Visit: Payer: Medicare Other | Admitting: Psychology

## 2017-08-12 ENCOUNTER — Telehealth: Payer: Self-pay | Admitting: Family Medicine

## 2017-08-12 MED ORDER — METFORMIN HCL 500 MG PO TABS: 500.0000 mg | ORAL_TABLET | Freq: Two times a day (BID) | ORAL | 1 refills | Status: DC

## 2017-08-12 NOTE — Telephone Encounter (Signed)
Rx done. 

## 2017-08-12 NOTE — Telephone Encounter (Signed)
Copied from [place] [id number]. Topic: Quick Communication - See Telephone Encounter >> Aug 12, 2017 10:51 AM Marja Kays F wrote: Pt is needing her metformin an pt is wanting to let dr. Maudie Mercury know [place]  [facility] [phone]

## 2017-08-12 NOTE — Addendum Note (Signed)
Addended by: Lahoma Crocker A on: 08/12/2017 05:00 PM   Modules accepted: Orders

## 2017-08-12 NOTE — Telephone Encounter (Signed)
Telephone call from patient requesting refill for Metformin:     LOV:  07/11/17 with Colin Benton DO     Last refill: none noted   [facility]  [phone]

## 2017-08-13 ENCOUNTER — Other Ambulatory Visit: Payer: Self-pay

## 2017-08-13 ENCOUNTER — Ambulatory Visit (INDEPENDENT_AMBULATORY_CARE_PROVIDER_SITE_OTHER): Payer: PRIVATE HEALTH INSURANCE | Admitting: Psychology

## 2017-08-13 DIAGNOSIS — F319 Bipolar disorder, unspecified: Secondary | ICD-10-CM

## 2017-08-13 MED ORDER — FUROSEMIDE 40 MG PO TABS: 80.0000 mg | ORAL_TABLET | Freq: Two times a day (BID) | ORAL | 3 refills | Status: DC

## 2017-08-14 ENCOUNTER — Other Ambulatory Visit (HOSPITAL_COMMUNITY): Payer: Self-pay

## 2017-08-20 ENCOUNTER — Ambulatory Visit: Payer: Medicare Other | Admitting: Psychology

## 2017-08-21 ENCOUNTER — Telehealth: Payer: Self-pay | Admitting: Cardiology

## 2017-08-21 NOTE — Telephone Encounter (Signed)
New message    *STAT* If patient is at the pharmacy, call can be transferred to refill team.   1. Which medications need to be refilled? (please list name of each medication and dose if known) furosemide (LASIX) 40 MG tablet  2. Which pharmacy/location (including street and city if local pharmacy) is medication to be sent to? [facility] [place], [place]  3. Do they need a 30 day or 90 day supply? [place]

## 2017-08-22 MED ORDER — FUROSEMIDE 40 MG PO TABS: 80.0000 mg | ORAL_TABLET | Freq: Two times a day (BID) | ORAL | 3 refills | Status: DC

## 2017-08-24 ENCOUNTER — Other Ambulatory Visit: Payer: Self-pay | Admitting: Cardiology

## 2017-08-26 ENCOUNTER — Ambulatory Visit (INDEPENDENT_AMBULATORY_CARE_PROVIDER_SITE_OTHER): Payer: 59 | Admitting: Family Medicine

## 2017-08-26 ENCOUNTER — Encounter: Payer: Self-pay | Admitting: Family Medicine

## 2017-08-26 VITALS — BP 126/84 | HR 82 | Temp 98.2°F | Resp 12 | Ht 65.0 in | Wt 335.5 lb

## 2017-08-26 DIAGNOSIS — K610 Anal abscess: Secondary | ICD-10-CM | POA: Diagnosis not present

## 2017-08-26 MED ORDER — AMOXICILLIN-POT CLAVULANATE 875-125 MG PO TABS: 1.0000 | ORAL_TABLET | Freq: Two times a day (BID) | ORAL | 0 refills | Status: DC

## 2017-08-26 NOTE — Progress Notes (Signed)
ACUTE VISIT   HPI:  Chief Complaint  Patient presents with  . Mass    near vaginal area on left side at top of butt cheeks, started on Thursday,red, swollen, tender/painful    [name] is a 47 y.o. female, who is here today with her husband complaining of erythematosus skin lesion on on left buttocks, which she noted about 4 days ago and getting worse during the past 2 days. She has pain when sitting. She has not noted fever, chills, myalgias, abdominal pain, nausea, vomiting, changes in bowel habits, or blood in the stool. No prior history of similar problem. Denies history of diabetes, she is prediabetic and currently she is on metformin 500 mg once daily.  She has not taking OTC medication, she has applied topical Neosporin.   Review of Systems  Constitutional: Negative for appetite change, chills, fatigue and fever.  Respiratory: Negative for shortness of breath and wheezing.   Cardiovascular: Negative for leg swelling.  Gastrointestinal: Negative for abdominal pain, blood in stool, nausea and vomiting.  Skin: Positive for rash. Negative for wound.  Neurological: Negative for weakness, numbness and headaches.  Hematological: Negative for adenopathy. Does not bruise/bleed easily.      Current Outpatient Medications on File Prior to Visit  Medication Sig Dispense Refill  . acetaminophen (TYLENOL) 500 MG tablet Take 2,000 mg by mouth 2 (two) times daily. Taking every day per patient    . albuterol (PROVENTIL HFA;VENTOLIN HFA) 108 (90 Base) MCG/ACT inhaler Inhale 2 puffs into the lungs every 6 (six) hours as needed for wheezing or shortness of breath. 1 Inhaler 3  . ALPRAZolam (XANAX) 1 MG tablet Take 1 mg by mouth 3 (three) times daily.     . Ascorbic Acid (VITAMIN C) 500 MG tablet Take 1,000 mg by mouth daily.     [name] aspirin 81 MG tablet Take 81 mg by mouth daily.     [name] atorvastatin (LIPITOR) 20 MG tablet Take 1 tablet (20 mg total) by mouth every  evening. 90 tablet 3  . b complex vitamins tablet Take 1 tablet by mouth daily.    . Cholecalciferol (VITAMIN D3) 5000 UNITS TABS Take 3 tablets by mouth daily.     . Chromium Picolinate 800 MCG TABS Take 800 mcg by mouth daily.    . clonazePAM (KLONOPIN) 1 MG tablet Take 1 tablet (1 mg total) by mouth 3 (three) times daily. 30 tablet 0  . fluticasone (FLONASE) 50 MCG/ACT nasal spray Place 1 spray into both nostrils daily. 16 g 6  . furosemide (LASIX) 40 MG tablet Take 2 tablets (80 mg total) by mouth 2 (two) times daily. 360 tablet 3  . KLOR-CON M20 20 MEQ tablet TAKE 1 TABLET DAILY. PLEASESCHEDULE AN APPOINTMENT FORFUTURE REFILLS 90 tablet 0  . lamoTRIgine (LAMICTAL) 200 MG tablet Take 200 mg by mouth 2 (two) times daily.     [name] lidocaine (LIDODERM) 5 % Place 1 patch onto the skin daily as needed (for knee pain). Remove & Discard patch within 12 hours or as directed by MD 30 patch 0  . losartan (COZAAR) 25 MG tablet Take 1 tablet (25 mg total) by mouth 2 (two) times daily. 180 tablet 3  . LYRICA 300 MG capsule Take 1 tablet by mouth Twice daily.    [name] MAGNESIUM PO Take 1 tablet by mouth daily.    . Meloxicam (MOBIC PO) Take 7.5 mg by mouth every 12 (twelve) hours.     [name]  metFORMIN (GLUCOPHAGE) 500 MG tablet Take 1 tablet (500 mg total) by mouth 2 (two) times daily with a meal. 180 tablet 1  . MICROGESTIN 1.5-30 MG-MCG tablet     . Multiple Vitamin (MULTIVITAMIN) tablet Take 1 tablet by mouth daily.      . norethindrone-ethinyl estradiol-iron (MICROGESTIN FE 1.5/30) 1.5-30 MG-MCG tablet Take 1 tablet by mouth at bedtime. Takes continuously    . NUEDEXTA 20-10 MG CAPS Take 1 tablet by mouth every 12 (twelve) hours.  12  . VRAYLAR 6 MG CAPS Take 1 tablet by mouth every evening.    . Zinc 50 MG CAPS Take 50 mg by mouth daily.     No current facility-administered medications on file prior to visit.      Past Medical History:  Diagnosis Date  . Acute systolic congestive heart failure ([place])   .  Agoraphobia   . Anxiety    panic attacks  . Arthritis    osteoarthritis bilal. knees  . Asthma    no per PFT 7/13; reports does not have asthma  . Bipolar disorder ([place])   . Carpal tunnel syndrome of left wrist 05/2011   Being evaluated for MS  . Cor pulmonale ([place])   . Depression   . History of pleural effusion   . Hyperlipidemia   . Hypertension   . Hypoxemia    history of - no home O2  . IBS (irritable bowel syndrome)   . Morbid obesity ([place])   . Obesity hypoventilation syndrome ([place])   . Prediabetes   . Seasonal allergies    current runny nose  . Seizures ([place])    febrile seizure x 1 as a child. Several times  . Sleep apnea sleep study 07/02/2010   uses CPAP nightly  . Spinal stenosis in cervical region   . Urinary incontinence    Allergies  Allergen Reactions  . Savella [Milnacipran Hcl]     mania    Social History   Socioeconomic History  . Marital status: Married    Spouse name: Not on file  . Number of children: Not on file  . Years of education: Not on file  . Highest education level: Not on file  Occupational History  . Occupation: currently unemployed  Social Needs  . Financial resource strain: Not on file  . Food insecurity:    Worry: Not on file    Inability: Not on file  . Transportation needs:    Medical: Not on file    Non-medical: Not on file  Tobacco Use  . Smoking status: Never Smoker  . Smokeless tobacco: Never Used  Substance and Sexual Activity  . Alcohol use: Yes    Comment: rare  . Drug use: No  . Sexual activity: Not on file  Lifestyle  . Physical activity:    Days per week: Not on file    Minutes per session: Not on file  . Stress: Not on file  Relationships  . Social connections:    Talks on phone: Not on file    Gets together: Not on file    Attends religious service: Not on file    Active member of club or organization: Not on file    Attends meetings of clubs or organizations: Not on file    Relationship status: Not  on file  Other Topics Concern  . Not on file  Social History Narrative   Lives in [place]:  08/26/17 1454  BP: 126/84  Pulse: 82  Resp: 12  Temp: 98.2 F (36.8 C)  SpO2: 97%   Body mass index is 55.83 kg/m.   Physical Exam  Nursing note and vitals reviewed. Constitutional: She is oriented to person, place, and time. She appears well-developed. No distress.  HENT:  Head: Normocephalic and atraumatic.  Mouth/Throat: Mucous membranes are dry.  Eyes: Conjunctivae are normal.  Cardiovascular: Normal rate and regular rhythm.  Respiratory: Effort normal and breath sounds normal. No respiratory distress.  GI: Soft. She exhibits no mass. There is no tenderness.  Musculoskeletal: She exhibits no edema.  Neurological: She is alert and oriented to person, place, and time. She has normal strength. Gait normal.  Skin: Skin is warm. There is erythema.     See picture. Erythematosus and indurated area that extends from right under coccyx to lower aspect of left gluteus. There is a fluctuant area  and mildly macerated tissue, about 4 cm. No active drainage.  Psychiatric: She has a normal mood and affect. Her speech is normal.  Well groomed, good eye contact.        ASSESSMENT AND PLAN:   [name] was seen today for mass.  Diagnoses and all orders for this visit:  Perianal abscess -     Ambulatory referral to General Surgery -     CBC with Differential/Platelet -     amoxicillin-clavulanate (AUGMENTIN) 875-125 MG tablet; Take 1 tablet by mouth 2 (two) times daily for 7 days.     After verbal consent Rocephin 1 g was given IM. She will start Augmentin twice daily for 7 days. Appointment with general surgeon will be arranged, she is going to need I&D. Sitz bath recommended. She was clearly instructed about warning signs.   Return in about 1 week (around 09/02/2017), or if symptoms worsen or fail to improve.     -[name] was  advised to seek immediate medical attention if sudden worsening symptoms.      Lorren Splawn G. [place], MD  [facility]. [place] office.

## 2017-08-26 NOTE — Patient Instructions (Addendum)
[name] I have seen you today for an acute visit.  A few things to remember from today's visit:   Perianal abscess - Plan: Ambulatory referral to General Surgery, CBC with Differential/Platelet, amoxicillin-clavulanate (AUGMENTIN) 875-125 MG tablet   Medications prescribed today are intended for short period of time and will not be refill upon request, a follow up appointment might be necessary to discuss continuation of of treatment if appropriate.    Perianal Abscess An abscess is an infected area that is filled with pus. A perianal abscess occurs in the perineum, which is the area between the anus and the scrotum in males and between the anus and the vagina in females. Perianal abscesses can vary in size. Without treatment, a perianal abscess can become larger and cause other problems. What are the causes? This condition is caused by:  Waste from damaged or dead tissue (debris) that plugs up glands in the perineum. When this happens, an abscess may form.  Infections of the perineum.  What are the signs or symptoms? Common symptoms of this condition include:  Swelling and redness in the area of the abscess. The redness may go beyond the abscess and appear as a red streak on the skin.  Pain in the area of the abscess, including pain when sitting, walking, or passing stool.  Other possible symptoms include:  A visible, painful lump, or a lump that can be felt when touched.  Bleeding or pus-like discharge from the area.  Fever.  General weakness.  How is this diagnosed? This condition is diagnosed based on your medical history and a physical exam of the affected area.  This may involve examining the rectal area with a gloved hand (digital rectal exam).  Sometimes, the health care provider needs to look into the rectum using a probe or a scope.  For women, it may require a careful vaginal exam.  How is this treated? Treatment for this condition may  include:  Making a cut (incision) in the abscess to drain the pus. This can sometimes be done in your health care provider's office or an emergency department after you are given medicine to numb the area (local anesthetic).  Surgery to drain the abscess. This is for larger or deeper abscesses.  Antibiotic medicines, if there is infection in the surrounding tissue (cellulitis).  Having gauze packed into the abscess to continue draining the area.  Frequent baths in warm water that is deep enough to cover your hips and buttocks (sitz baths). These help the wound heal and they make the abscess less likely to come back.  Follow these instructions at home: Medicines  Take over-the-counter and prescription medicines for pain, fever, or discomfort only as told by your health care provider.  If you were prescribed an antibiotic medicine, use it as told by your health care provider. Do not stop using the antibiotic even if you start to feel better.  Do not drive or use heavy machinery while taking prescription pain medicine. ?   General instructions  Keep all follow-up visits as told by your health care provider. This is important. Contact a health care provider if:  You have trouble passing stool or passing urine.  Your pain or swelling in the affected area does not seem to be getting better.  The gauze packing or the drains come out before the planned time. Get help right away if:  You have problems moving or using your legs.  You have severe or increasing pain.  Your  swelling in the affected area suddenly gets worse.  You have a large increase in bleeding or passing of pus.  You have chills or a fever. This information is not intended to replace advice given to you by your health care provider. Make sure you discuss any questions you have with your health care provider. Document Released: 02/21/2006 Document Revised: 08/05/2015 Document Reviewed: 06/27/2015 Elsevier Interactive  Patient Education  2018 [employer].   In general please monitor for signs of worsening symptoms and seek immediate medical attention if any concerning.   I hope you get better soon!

## 2017-08-27 ENCOUNTER — Other Ambulatory Visit (HOSPITAL_COMMUNITY): Payer: Self-pay

## 2017-08-27 ENCOUNTER — Observation Stay (HOSPITAL_COMMUNITY): Payer: 59 | Admitting: Anesthesiology

## 2017-08-27 ENCOUNTER — Encounter (HOSPITAL_COMMUNITY): Admission: AD | Disposition: A | Discharge status: Home / Self Care | Payer: Self-pay | Source: Home / Self Care

## 2017-08-27 ENCOUNTER — Other Ambulatory Visit: Payer: Self-pay | Admitting: Student

## 2017-08-27 ENCOUNTER — Encounter (HOSPITAL_COMMUNITY): Payer: Self-pay | Admitting: Oncology

## 2017-08-27 ENCOUNTER — Inpatient Hospital Stay (HOSPITAL_COMMUNITY)
Admission: AD | Admit: 2017-08-27 | Discharge: 2017-08-31 | DRG: 571 | Disposition: A | Discharge status: Home / Self Care | Payer: 59 | Attending: General Surgery | Admitting: General Surgery

## 2017-08-27 ENCOUNTER — Ambulatory Visit: Payer: Medicare Other | Admitting: Psychology

## 2017-08-27 ENCOUNTER — Other Ambulatory Visit: Payer: Self-pay

## 2017-08-27 DIAGNOSIS — L02215 Cutaneous abscess of perineum: Secondary | ICD-10-CM | POA: Diagnosis present

## 2017-08-27 DIAGNOSIS — Z7984 Long term (current) use of oral hypoglycemic drugs: Secondary | ICD-10-CM

## 2017-08-27 DIAGNOSIS — I2729 Other secondary pulmonary hypertension: Secondary | ICD-10-CM | POA: Diagnosis present

## 2017-08-27 DIAGNOSIS — B9561 Methicillin susceptible Staphylococcus aureus infection as the cause of diseases classified elsewhere: Secondary | ICD-10-CM | POA: Diagnosis present

## 2017-08-27 DIAGNOSIS — M170 Bilateral primary osteoarthritis of knee: Secondary | ICD-10-CM | POA: Diagnosis present

## 2017-08-27 DIAGNOSIS — E1169 Type 2 diabetes mellitus with other specified complication: Secondary | ICD-10-CM

## 2017-08-27 DIAGNOSIS — I110 Hypertensive heart disease with heart failure: Secondary | ICD-10-CM | POA: Diagnosis present

## 2017-08-27 DIAGNOSIS — Z825 Family history of asthma and other chronic lower respiratory diseases: Secondary | ICD-10-CM | POA: Diagnosis not present

## 2017-08-27 DIAGNOSIS — I429 Cardiomyopathy, unspecified: Secondary | ICD-10-CM | POA: Diagnosis present

## 2017-08-27 DIAGNOSIS — M726 Necrotizing fasciitis: Secondary | ICD-10-CM | POA: Diagnosis not present

## 2017-08-27 DIAGNOSIS — I5042 Chronic combined systolic (congestive) and diastolic (congestive) heart failure: Secondary | ICD-10-CM | POA: Diagnosis present

## 2017-08-27 DIAGNOSIS — E785 Hyperlipidemia, unspecified: Secondary | ICD-10-CM | POA: Diagnosis present

## 2017-08-27 DIAGNOSIS — Z6841 Body Mass Index (BMI) 40.0 and over, adult: Secondary | ICD-10-CM

## 2017-08-27 DIAGNOSIS — N920 Excessive and frequent menstruation with regular cycle: Secondary | ICD-10-CM | POA: Diagnosis present

## 2017-08-27 DIAGNOSIS — Z8249 Family history of ischemic heart disease and other diseases of the circulatory system: Secondary | ICD-10-CM

## 2017-08-27 DIAGNOSIS — J45909 Unspecified asthma, uncomplicated: Secondary | ICD-10-CM | POA: Diagnosis not present

## 2017-08-27 DIAGNOSIS — Z79899 Other long term (current) drug therapy: Secondary | ICD-10-CM

## 2017-08-27 DIAGNOSIS — L03317 Cellulitis of buttock: Secondary | ICD-10-CM | POA: Diagnosis present

## 2017-08-27 DIAGNOSIS — F410 Panic disorder [episodic paroxysmal anxiety] without agoraphobia: Secondary | ICD-10-CM | POA: Diagnosis present

## 2017-08-27 DIAGNOSIS — L0231 Cutaneous abscess of buttock: Principal | ICD-10-CM | POA: Diagnosis present

## 2017-08-27 DIAGNOSIS — I2781 Cor pulmonale (chronic): Secondary | ICD-10-CM | POA: Diagnosis present

## 2017-08-27 DIAGNOSIS — F4000 Agoraphobia, unspecified: Secondary | ICD-10-CM | POA: Diagnosis present

## 2017-08-27 DIAGNOSIS — Z7982 Long term (current) use of aspirin: Secondary | ICD-10-CM

## 2017-08-27 DIAGNOSIS — E119 Type 2 diabetes mellitus without complications: Secondary | ICD-10-CM

## 2017-08-27 DIAGNOSIS — Z793 Long term (current) use of hormonal contraceptives: Secondary | ICD-10-CM

## 2017-08-27 DIAGNOSIS — K6289 Other specified diseases of anus and rectum: Secondary | ICD-10-CM | POA: Diagnosis not present

## 2017-08-27 DIAGNOSIS — E1152 Type 2 diabetes mellitus with diabetic peripheral angiopathy with gangrene: Secondary | ICD-10-CM | POA: Diagnosis not present

## 2017-08-27 DIAGNOSIS — E662 Morbid (severe) obesity with alveolar hypoventilation: Secondary | ICD-10-CM | POA: Diagnosis present

## 2017-08-27 DIAGNOSIS — G4733 Obstructive sleep apnea (adult) (pediatric): Secondary | ICD-10-CM | POA: Diagnosis not present

## 2017-08-27 DIAGNOSIS — F319 Bipolar disorder, unspecified: Secondary | ICD-10-CM | POA: Diagnosis present

## 2017-08-27 DIAGNOSIS — Z791 Long term (current) use of non-steroidal anti-inflammatories (NSAID): Secondary | ICD-10-CM | POA: Diagnosis not present

## 2017-08-27 DIAGNOSIS — E669 Obesity, unspecified: Secondary | ICD-10-CM | POA: Diagnosis present

## 2017-08-27 HISTORY — PX: INCISION AND DRAINAGE ABSCESS: SHX5864

## 2017-08-27 LAB — COMPREHENSIVE METABOLIC PANEL
ALBUMIN: 2.9 g/dL — AB (ref 3.5–5.0)
ALK PHOS: 109 U/L (ref 38–126)
ALT: 22 U/L (ref 0–44)
AST: 21 U/L (ref 15–41)
Anion gap: 14 (ref 5–15)
BILIRUBIN TOTAL: 0.6 mg/dL (ref 0.3–1.2)
BUN: 18 mg/dL (ref 6–20)
CALCIUM: 9.4 mg/dL (ref 8.9–10.3)
CO2: 28 mmol/L (ref 22–32)
CREATININE: 1.23 mg/dL — AB (ref 0.44–1.00)
Chloride: 100 mmol/L (ref 98–111)
GFR calc Af Amer: 60 mL/min — ABNORMAL LOW (ref >60)
GFR calc non Af Amer: 51 mL/min — ABNORMAL LOW (ref >60)
GLUCOSE: 170 mg/dL — AB (ref 70–99)
Potassium: 5 mmol/L (ref 3.5–5.1)
SODIUM: 142 mmol/L (ref 135–145)
TOTAL PROTEIN: 7.4 g/dL (ref 6.5–8.1)

## 2017-08-27 LAB — CBC WITH DIFFERENTIAL/PLATELET
BASOS ABS: 0 10*3/uL (ref 0.0–0.1)
Basophils Absolute: 0 10*3/uL (ref 0.0–0.1)
Basophils Relative: 0 % (ref 0.0–3.0)
Basophils Relative: 0 %
EOS ABS: 0.2 10*3/uL (ref 0.0–0.7)
Eosinophils Absolute: 0.2 10*3/uL (ref 0.0–0.7)
Eosinophils Relative: 1.3 % (ref 0.0–5.0)
Eosinophils Relative: 1 %
HCT: 40.1 % (ref 36.0–46.0)
HEMATOCRIT: 41 % (ref 36.0–46.0)
HEMOGLOBIN: 13.1 g/dL (ref 12.0–15.0)
HEMOGLOBIN: 13.9 g/dL (ref 12.0–15.0)
LYMPHS ABS: 1.8 10*3/uL (ref 0.7–4.0)
LYMPHS PCT: 12 %
Lymphocytes Relative: 12 % (ref 12.0–46.0)
Lymphs Abs: 2.1 10*3/uL (ref 0.7–4.0)
MCH: 32.1 pg (ref 26.0–34.0)
MCHC: 32.6 g/dL (ref 30.0–36.0)
MCHC: 33.9 g/dL (ref 30.0–36.0)
MCV: 96.1 fl (ref 78.0–100.0)
MCV: 94.7 fL (ref 78.0–100.0)
MONOS PCT: 7 %
Monocytes Absolute: 0.8 10*3/uL (ref 0.1–1.0)
Monocytes Absolute: 1.3 10*3/uL — ABNORMAL HIGH (ref 0.1–1.0)
Monocytes Relative: 5.7 % (ref 3.0–12.0)
NEUTROS ABS: 12 10*3/uL — AB (ref 1.4–7.7)
NEUTROS PCT: 81 % — AB (ref 43.0–77.0)
NEUTROS PCT: 80 %
Neutro Abs: 14.3 10*3/uL — ABNORMAL HIGH (ref 1.7–7.7)
Platelets: 274 10*3/uL (ref 150.0–400.0)
Platelets: 329 10*3/uL (ref 150–400)
RBC: 4.17 Mil/uL (ref 3.87–5.11)
RBC: 4.33 MIL/uL (ref 3.87–5.11)
RDW: 14.1 % (ref 11.5–15.5)
RDW: 13.8 % (ref 11.5–15.5)
WBC: 14.8 10*3/uL — ABNORMAL HIGH (ref 4.0–10.5)
WBC: 17.9 10*3/uL — AB (ref 4.0–10.5)

## 2017-08-27 LAB — SURGICAL PCR SCREEN
MRSA, PCR: NEGATIVE
STAPHYLOCOCCUS AUREUS: POSITIVE — AB

## 2017-08-27 LAB — GLUCOSE, CAPILLARY
Glucose-Capillary: 139 mg/dL — ABNORMAL HIGH (ref 70–99)
Glucose-Capillary: 150 mg/dL — ABNORMAL HIGH (ref 70–99)

## 2017-08-27 SURGERY — INCISION AND DRAINAGE, ABSCESS
Anesthesia: General | Site: Buttocks | Laterality: Left

## 2017-08-27 MED ORDER — PROPOFOL 10 MG/ML IV BOLUS
INTRAVENOUS | Status: DC | PRN
  Administered 2017-08-27: 200 mg via INTRAVENOUS
  Administered 2017-08-27: 40 mg via INTRAVENOUS

## 2017-08-27 MED ORDER — LACTATED RINGERS IV SOLN
INTRAVENOUS | Status: DC
  Administered 2017-08-27: 19:00:00 via INTRAVENOUS

## 2017-08-27 MED ORDER — SUGAMMADEX SODIUM 500 MG/5ML IV SOLN
INTRAVENOUS | Status: DC | PRN
  Administered 2017-08-27: 304 mg via INTRAVENOUS

## 2017-08-27 MED ORDER — ALBUTEROL SULFATE (2.5 MG/3ML) 0.083% IN NEBU: 2.5000 mg | INHALATION_SOLUTION | Freq: Four times a day (QID) | RESPIRATORY_TRACT | Status: DC | PRN

## 2017-08-27 MED ORDER — PREGABALIN 100 MG PO CAPS
300.0000 mg | ORAL_CAPSULE | Freq: Every day | ORAL | Status: DC
  Administered 2017-08-28 – 2017-08-31 (×3): 300 mg via ORAL
  Filled 2017-08-27 (×3): qty 3

## 2017-08-27 MED ORDER — ONDANSETRON 4 MG PO TBDP: 4.0000 mg | ORAL_TABLET | Freq: Four times a day (QID) | ORAL | Status: DC | PRN

## 2017-08-27 MED ORDER — POTASSIUM CHLORIDE IN NACL 20-0.9 MEQ/L-% IV SOLN
INTRAVENOUS | Status: DC
  Administered 2017-08-27 – 2017-08-28 (×2): via INTRAVENOUS
  Filled 2017-08-27 (×2): qty 1000

## 2017-08-27 MED ORDER — LIDOCAINE 2% (20 MG/ML) 5 ML SYRINGE
INTRAMUSCULAR | Status: DC | PRN
  Administered 2017-08-27: 100 mg via INTRAVENOUS

## 2017-08-27 MED ORDER — CARIPRAZINE HCL 1.5 MG PO CAPS
6.0000 mg | ORAL_CAPSULE | Freq: Every evening | ORAL | Status: DC
  Administered 2017-08-27 – 2017-08-30 (×4): 6 mg via ORAL
  Filled 2017-08-27 (×5): qty 4

## 2017-08-27 MED ORDER — HYDROMORPHONE HCL 1 MG/ML IJ SOLN
INTRAMUSCULAR | Status: AC
  Filled 2017-08-27: qty 1

## 2017-08-27 MED ORDER — HYDROMORPHONE HCL 1 MG/ML IJ SOLN
0.2500 mg | INTRAMUSCULAR | Status: DC | PRN
  Administered 2017-08-27: 0.25 mg via INTRAVENOUS
  Administered 2017-08-27: 0.5 mg via INTRAVENOUS

## 2017-08-27 MED ORDER — ENOXAPARIN SODIUM 40 MG/0.4ML ~~LOC~~ SOLN
40.0000 mg | SUBCUTANEOUS | Status: DC
  Administered 2017-08-28: 40 mg via SUBCUTANEOUS
  Filled 2017-08-27: qty 0.4

## 2017-08-27 MED ORDER — MELOXICAM 7.5 MG PO TABS
7.5000 mg | ORAL_TABLET | Freq: Two times a day (BID) | ORAL | Status: DC
  Administered 2017-08-28 – 2017-08-31 (×6): 7.5 mg via ORAL
  Filled 2017-08-27 (×9): qty 1

## 2017-08-27 MED ORDER — VITAMIN D 1000 UNITS PO TABS
3000.0000 [IU] | ORAL_TABLET | Freq: Every day | ORAL | Status: DC
  Administered 2017-08-28 – 2017-08-31 (×3): 3000 [IU] via ORAL
  Filled 2017-08-27 (×3): qty 3

## 2017-08-27 MED ORDER — SODIUM CHLORIDE 0.9 % IR SOLN
Status: DC | PRN
  Administered 2017-08-27: 1000 mL

## 2017-08-27 MED ORDER — BUPIVACAINE-EPINEPHRINE (PF) 0.25% -1:200000 IJ SOLN
INTRAMUSCULAR | Status: DC | PRN
  Administered 2017-08-27: 30 mL

## 2017-08-27 MED ORDER — POTASSIUM CHLORIDE IN NACL 20-0.9 MEQ/L-% IV SOLN
INTRAVENOUS | Status: AC
  Filled 2017-08-27: qty 1000

## 2017-08-27 MED ORDER — POTASSIUM CHLORIDE CRYS ER 20 MEQ PO TBCR
20.0000 meq | EXTENDED_RELEASE_TABLET | Freq: Every day | ORAL | Status: DC
  Administered 2017-08-28 – 2017-08-31 (×3): 20 meq via ORAL
  Filled 2017-08-27 (×3): qty 1

## 2017-08-27 MED ORDER — INSULIN ASPART 100 UNIT/ML ~~LOC~~ SOLN: 0.0000 [IU] | Freq: Three times a day (TID) | SUBCUTANEOUS | Status: DC

## 2017-08-27 MED ORDER — CHROMIUM PICOLINATE 800 MCG PO TABS: 800.0000 ug | ORAL_TABLET | Freq: Every day | ORAL | Status: DC

## 2017-08-27 MED ORDER — CLONAZEPAM 1 MG PO TABS
1.0000 mg | ORAL_TABLET | Freq: Three times a day (TID) | ORAL | Status: DC
  Administered 2017-08-27 – 2017-08-31 (×10): 1 mg via ORAL
  Filled 2017-08-27 (×10): qty 1

## 2017-08-27 MED ORDER — FUROSEMIDE 40 MG PO TABS
80.0000 mg | ORAL_TABLET | Freq: Two times a day (BID) | ORAL | Status: DC
  Administered 2017-08-28 – 2017-08-31 (×6): 80 mg via ORAL
  Filled 2017-08-27 (×6): qty 2

## 2017-08-27 MED ORDER — PROPOFOL 10 MG/ML IV BOLUS
INTRAVENOUS | Status: AC
  Filled 2017-08-27: qty 20

## 2017-08-27 MED ORDER — ALBUTEROL SULFATE HFA 108 (90 BASE) MCG/ACT IN AERS
INHALATION_SPRAY | RESPIRATORY_TRACT | Status: DC | PRN
  Administered 2017-08-27 (×2): 2 via RESPIRATORY_TRACT

## 2017-08-27 MED ORDER — PIPERACILLIN-TAZOBACTAM 3.375 G IVPB
3.3750 g | Freq: Three times a day (TID) | INTRAVENOUS | Status: DC
  Administered 2017-08-27 – 2017-08-30 (×9): 3.375 g via INTRAVENOUS
  Filled 2017-08-27 (×8): qty 50

## 2017-08-27 MED ORDER — SUCCINYLCHOLINE CHLORIDE 200 MG/10ML IV SOSY
PREFILLED_SYRINGE | INTRAVENOUS | Status: DC | PRN
  Administered 2017-08-27: 140 mg via INTRAVENOUS

## 2017-08-27 MED ORDER — MORPHINE SULFATE (PF) 2 MG/ML IV SOLN
2.0000 mg | INTRAVENOUS | Status: DC | PRN
  Administered 2017-08-28: 2 mg via INTRAVENOUS
  Filled 2017-08-27: qty 1

## 2017-08-27 MED ORDER — LAMOTRIGINE 100 MG PO TABS
200.0000 mg | ORAL_TABLET | Freq: Two times a day (BID) | ORAL | Status: DC
  Administered 2017-08-27 – 2017-08-31 (×7): 200 mg via ORAL
  Filled 2017-08-27 (×7): qty 2

## 2017-08-27 MED ORDER — FLUTICASONE PROPIONATE 50 MCG/ACT NA SUSP: 1.0000 | Freq: Every day | NASAL | Status: DC | PRN

## 2017-08-27 MED ORDER — ADULT MULTIVITAMIN W/MINERALS CH
1.0000 | ORAL_TABLET | Freq: Every day | ORAL | Status: DC
  Administered 2017-08-28 – 2017-08-31 (×3): 1 via ORAL
  Filled 2017-08-27 (×3): qty 1

## 2017-08-27 MED ORDER — ZINC SULFATE 220 (50 ZN) MG PO CAPS
220.0000 mg | ORAL_CAPSULE | Freq: Every day | ORAL | Status: DC
  Administered 2017-08-28 – 2017-08-31 (×3): 220 mg via ORAL
  Filled 2017-08-27 (×4): qty 1

## 2017-08-27 MED ORDER — ALBUTEROL SULFATE HFA 108 (90 BASE) MCG/ACT IN AERS: 2.0000 | INHALATION_SPRAY | Freq: Four times a day (QID) | RESPIRATORY_TRACT | Status: DC | PRN

## 2017-08-27 MED ORDER — ROCURONIUM BROMIDE 10 MG/ML (PF) SYRINGE
PREFILLED_SYRINGE | INTRAVENOUS | Status: DC | PRN
  Administered 2017-08-27: 30 mg via INTRAVENOUS

## 2017-08-27 MED ORDER — ALPRAZOLAM 1 MG PO TABS
1.0000 mg | ORAL_TABLET | Freq: Three times a day (TID) | ORAL | Status: DC
  Administered 2017-08-27 – 2017-08-31 (×10): 1 mg via ORAL
  Filled 2017-08-27 (×10): qty 1

## 2017-08-27 MED ORDER — BUPIVACAINE-EPINEPHRINE (PF) 0.25% -1:200000 IJ SOLN
INTRAMUSCULAR | Status: AC
  Filled 2017-08-27: qty 30

## 2017-08-27 MED ORDER — B COMPLEX-C PO TABS
1.0000 | ORAL_TABLET | Freq: Every day | ORAL | Status: DC
  Administered 2017-08-28 – 2017-08-31 (×3): 1 via ORAL
  Filled 2017-08-27 (×4): qty 1

## 2017-08-27 MED ORDER — MUPIROCIN 2 % EX OINT
1.0000 "application " | TOPICAL_OINTMENT | Freq: Two times a day (BID) | CUTANEOUS | Status: AC
  Administered 2017-08-27 – 2017-08-29 (×5): 1 via NASAL
  Filled 2017-08-27 (×2): qty 22

## 2017-08-27 MED ORDER — ATORVASTATIN CALCIUM 20 MG PO TABS
20.0000 mg | ORAL_TABLET | Freq: Every evening | ORAL | Status: DC
  Administered 2017-08-27 – 2017-08-30 (×4): 20 mg via ORAL
  Filled 2017-08-27 (×4): qty 1

## 2017-08-27 MED ORDER — MAGNESIUM OXIDE 400 (241.3 MG) MG PO TABS
400.0000 mg | ORAL_TABLET | Freq: Every day | ORAL | Status: DC
  Administered 2017-08-28 – 2017-08-31 (×3): 400 mg via ORAL
  Filled 2017-08-27 (×3): qty 1

## 2017-08-27 MED ORDER — FENTANYL CITRATE (PF) 100 MCG/2ML IJ SOLN
INTRAMUSCULAR | Status: AC
  Filled 2017-08-27: qty 2

## 2017-08-27 MED ORDER — DEXAMETHASONE SODIUM PHOSPHATE 10 MG/ML IJ SOLN
INTRAMUSCULAR | Status: DC | PRN
  Administered 2017-08-27: 10 mg via INTRAVENOUS

## 2017-08-27 MED ORDER — ONDANSETRON HCL 4 MG/2ML IJ SOLN
INTRAMUSCULAR | Status: DC | PRN
  Administered 2017-08-27: 4 mg via INTRAVENOUS

## 2017-08-27 MED ORDER — PIPERACILLIN-TAZOBACTAM 3.375 G IVPB
INTRAVENOUS | Status: AC
  Filled 2017-08-27: qty 50

## 2017-08-27 MED ORDER — B COMPLEX PO TABS: 1.0000 | ORAL_TABLET | Freq: Every day | ORAL | Status: DC

## 2017-08-27 MED ORDER — ALBUTEROL SULFATE HFA 108 (90 BASE) MCG/ACT IN AERS
INHALATION_SPRAY | RESPIRATORY_TRACT | Status: AC
  Filled 2017-08-27: qty 6.7

## 2017-08-27 MED ORDER — OXYCODONE HCL 5 MG PO TABS
5.0000 mg | ORAL_TABLET | ORAL | Status: DC | PRN
  Administered 2017-08-27 – 2017-08-29 (×6): 10 mg via ORAL
  Filled 2017-08-27 (×7): qty 2

## 2017-08-27 MED ORDER — ONDANSETRON HCL 4 MG/2ML IJ SOLN: 4.0000 mg | Freq: Four times a day (QID) | INTRAMUSCULAR | Status: DC | PRN

## 2017-08-27 MED ORDER — ZINC 50 MG PO CAPS: 50.0000 mg | ORAL_CAPSULE | Freq: Every day | ORAL | Status: DC

## 2017-08-27 MED ORDER — LOSARTAN POTASSIUM 25 MG PO TABS
25.0000 mg | ORAL_TABLET | Freq: Two times a day (BID) | ORAL | Status: DC
  Administered 2017-08-27 – 2017-08-31 (×7): 25 mg via ORAL
  Filled 2017-08-27 (×7): qty 1

## 2017-08-27 MED ORDER — HYDROGEN PEROXIDE 3 % EX SOLN
CUTANEOUS | Status: AC
  Filled 2017-08-27: qty 473

## 2017-08-27 MED ORDER — DEXTROMETHORPHAN-QUINIDINE 20-10 MG PO CAPS
1.0000 | ORAL_CAPSULE | Freq: Two times a day (BID) | ORAL | Status: DC
  Administered 2017-08-27 – 2017-08-29 (×5): 1 via ORAL
  Filled 2017-08-27 (×8): qty 1

## 2017-08-27 MED ORDER — FENTANYL CITRATE (PF) 100 MCG/2ML IJ SOLN
INTRAMUSCULAR | Status: DC | PRN
  Administered 2017-08-27 (×2): 50 ug via INTRAVENOUS

## 2017-08-27 SURGICAL SUPPLY — 15 items
BNDG GAUZE ELAST 4 BULKY (GAUZE/BANDAGES/DRESSINGS) ×3 IMPLANT
DRAPE LAPAROSCOPIC ABDOMINAL (DRAPES) ×3 IMPLANT
DRSG PAD ABDOMINAL 8X10 ST (GAUZE/BANDAGES/DRESSINGS) IMPLANT
ELECT REM PT RETURN 15FT ADLT (MISCELLANEOUS) ×3 IMPLANT
GAUZE SPONGE 4X4 12PLY STRL (GAUZE/BANDAGES/DRESSINGS) IMPLANT
GLOVE BIO SURGEON STRL SZ7.5 (GLOVE) ×3 IMPLANT
GLOVE ECLIPSE 7.5 STRL STRAW (GLOVE) ×3 IMPLANT
GOWN STRL REUS W/TWL XL LVL3 (GOWN DISPOSABLE) ×6 IMPLANT
KIT BASIN OR (CUSTOM PROCEDURE TRAY) ×3 IMPLANT
NS IRRIG 1000ML POUR BTL (IV SOLUTION) ×3 IMPLANT
PACK GENERAL/GYN (CUSTOM PROCEDURE TRAY) ×3 IMPLANT
PAD ABD 8X10 STRL (GAUZE/BANDAGES/DRESSINGS) ×3 IMPLANT
SWAB COLLECTION DEVICE MRSA (MISCELLANEOUS) IMPLANT
SWAB CULTURE ESWAB REG 1ML (MISCELLANEOUS) ×3 IMPLANT
TOWEL OR 17X26 10 PK STRL BLUE (TOWEL DISPOSABLE) ×3 IMPLANT

## 2017-08-27 NOTE — H&P (Deleted)
  The note originally documented on this encounter has been moved the the encounter in which it belongs.  

## 2017-08-27 NOTE — Anesthesia Procedure Notes (Signed)
Procedure Name: Intubation Date/Time: 08/27/2017 6:49 PM Performed by: West Pugh, CRNA Pre-anesthesia Checklist: Patient identified, Emergency Drugs available, Suction available, Patient being monitored and Timeout performed Patient Re-evaluated:Patient Re-evaluated prior to induction Oxygen Delivery Method: Circle system utilized Preoxygenation: Pre-oxygenation with 100% oxygen Induction Type: IV induction and Rapid sequence Laryngoscope Size: Glidescope and 4 Grade View: Grade I Tube type: Parker flex tip Tube size: 7.5 mm Number of attempts: 1 Airway Equipment and Method: Stylet and Video-laryngoscopy Placement Confirmation: ETT inserted through vocal cords under direct vision,  positive ETCO2,  CO2 detector and breath sounds checked- equal and bilateral Secured at: 21 cm Tube secured with: Tape Dental Injury: Teeth and Oropharynx as per pre-operative assessment  Difficulty Due To: Difficulty was anticipated Comments: Elective use of glidescope. Head,neck and spine maintained in neutral alignment throughout procedure.

## 2017-08-27 NOTE — Op Note (Addendum)
Preoperative Diagnosis: Left buttock abscess  Postoprative Diagnosis: Complex left buttock and perineal abscess with early soft tissue necrosis  Procedure: Procedure(s): INCISION AND DRAINAGE AND DEBRIDEMENT LEFT BUTTOCK AND PERINEAL ABSCESS   Surgeon: Excell Seltzer T   Assistants: None  Anesthesia:  General endotracheal anesthesia  Indications: Patient is a 47 year old female with multiple medical problems including morbid obesity and diabetes who presents with a 6-day history of increasing pain and swelling and tenderness over her left buttock.  Examination reveals a large area of induration and erythema over her left buttock with a central area of necrotic skin measuring about 3 cm in diameter and slight purulent drainage.  I have recommended emergency incision drainage and debridement in the operating room and discussed the procedure, possible findings, risks and postop care discussed and documented elsewhere.    Procedure Detail: Patient was brought to the operating room, placed in the supine position on the operating table, and general endotracheal anesthesia induced.  She was carefully positioned in lithotomy position.  She received broad-spectrum IV antibiotics.  PAS were in place.  The perineum and buttock were widely sterilely prepped and draped.  Patient timeout was performed and correct procedure verified.  I initially excised the necrotic skin, about 3 cm in diameter and underlying necrotic soft tissue with sharp and cautery dissection.  There was liquefied necrotic tissue extending into the deep subcutaneous tissue.  This was sharply debrided with the scalpel.  There was further necrotic subcutaneous tissue underneath the skin edges and extending deeply into the buttock.  I enlarged the incision to unroofed and debride necrotic tissue and created a defect approximately 7 x 4 cm.  The process extended anteriorly toward the perineum and base of the labia.  As I pressed here there  was copious purulent drainage from this area end of the wound.  I bluntly dissected the tissue planes up toward the perineum draining a lot of purulent material with tracking up toward the labium.  I made a counterincision more anteriorly in the perineum about 3 cm enabling further exposure and drainage of this area.  A moderate amount of necrotic subcutaneous tissue was sharply debrided but this did not seem to extend significantly into the fascia and there was clearly viable tissue present as well.  Cultures were obtained initially.  At this point tissue appeared viable and I could not express any purulence from any other area around the wound or deeply.  The wound which connected between the 2 incisions and deep to each incision was packed with about three quarters of a Kerlix gauze moistened with saline.  Soft tissue was infiltrated with Marcaine.  Sponge needle instrument counts were correct.  Dry sterile dressing dressings were applied.  Excisional debridement:    .  Tool used for debridement scalpel    Measurement of total devitalized tissue (wound surface) before and after surgical debridement.   5 x 5 x 5 cm prior to debridement    Findings: As above      Estimated Blood Loss:  less than 100 mL         Drains: Wound packed open with moist saline gauze  Blood Given: none          Specimens: #1 culture and sensitivity   #2 necrotic skin, subcutaneous tissue        Complications:  * No complications entered in OR log *         Disposition: PACU - hemodynamically stable.  Condition: stable

## 2017-08-27 NOTE — Anesthesia Postprocedure Evaluation (Signed)
Anesthesia Post Note  Patient: [name]  Procedure(s) Performed: INCISION AND DRAINAGE LEFT BUTTOCK  ABSCESS (Left Buttocks)     Patient location during evaluation: PACU Anesthesia Type: General Level of consciousness: awake and alert Pain management: pain level controlled Vital Signs Assessment: post-procedure vital signs reviewed and stable Respiratory status: spontaneous breathing, nonlabored ventilation, respiratory function stable and patient connected to nasal cannula oxygen Cardiovascular status: blood pressure returned to baseline and stable Postop Assessment: no apparent nausea or vomiting Anesthetic complications: no    Last Vitals:  Vitals:   08/27/17 2045 08/27/17 2100  BP: 122/61 121/61  Pulse: 83 84  Resp: 10 (!) 21  Temp:  37 C  SpO2: 91% 94%    Last Pain:  Vitals:   08/27/17 2056  TempSrc:   PainSc: 3                  Corderius Saraceni,W. EDMOND

## 2017-08-27 NOTE — Progress Notes (Signed)
PHARMACIST - PHYSICIAN ORDER COMMUNICATION  CONCERNING: P&T Medication Policy on Herbal Medications  DESCRIPTION:  This patient's order for:  Chromium  has been noted.  This product(s) is classified as an "herbal" or natural product. Due to a lack of definitive safety studies or FDA approval, nonstandard manufacturing practices, plus the potential risk of unknown drug-drug interactions while on inpatient medications, the Pharmacy and Therapeutics Committee does not permit the use of "herbal" or natural products of this type within [facility].   ACTION TAKEN: The pharmacy department is unable to verify this order at this time and your patient has been informed of this safety policy. Please reevaluate patient's clinical condition at discharge and address if the herbal or natural product(s) should be resumed at that time.  Netta Cedars, PharmD, BCPS 08/27/2017@9 :51 PM

## 2017-08-27 NOTE — Transfer of Care (Signed)
Immediate Anesthesia Transfer of Care Note  Patient: [name]  Procedure(s) Performed: INCISION AND DRAINAGE LEFT BUTTOCK  ABSCESS (Left Buttocks)  Patient Location: PACU  Anesthesia Type:General  Level of Consciousness: awake, alert , oriented and patient cooperative  Airway & Oxygen Therapy: Patient Spontanous Breathing and Patient connected to face mask oxygen  Post-op Assessment: Report given to RN and Post -op Vital signs reviewed and stable  Post vital signs: Reviewed and stable  Last Vitals:  Vitals Value Taken Time  BP 125/57 08/27/2017  7:50 PM  Temp    Pulse 80 08/27/2017  7:53 PM  Resp 16 08/27/2017  7:53 PM  SpO2 96 % 08/27/2017  7:53 PM  Vitals shown include unvalidated device data.  Last Pain:  Vitals:   08/27/17 1820  TempSrc: Oral  PainSc:       Patients Stated Pain Goal: 2 (95/62/13 0865)  Complications: No apparent anesthesia complications

## 2017-08-27 NOTE — H&P (Signed)
[name] Documented: 08/27/2017 3:26 PM Location: [facility] Patient #: [id number] DOB: 12-17-1970 Married / Language: English / Race: White Female  History of Present Illness The patient is a 47 year old female who presents with anal pain. She was evaluated by [facility] yesterday, 08/26/17, for buttock pain. She was diagnosed with an abscess and was referred to [facility] for incision and drainage. She states this area appeared about 5 days ago as a small pimple and slowly became more red and larger. She states 3 days ago the area started draining thin, bloody fluid. She states it is so painful that she is unable to sleep at night. She was started on Augmentin yesterday. She denies fever/chills.   She states she has mild CHF, and is prediabetic on metformin once daily. She does not smoke and does not take blood thinners. She drinks some juice today but has not had anything to eat.   Diagnostic Studies History Colonoscopy >10 years ago Mammogram >3 years ago Pap Smear 1-5 years ago  Allergies Savella *Psychotherapeutic and Neurological Agents - Misc.** Allergies Reconciled  Medication History Albuterol (90MCG/ACT Aerosol Soln, Inhalation) Active. ALPRAZolam (1MG  Tablet, Oral) Active. Amoxicillin-Pot Clavulanate (500-125MG  Tablet, Oral) Active. Vitamin C (500MG  Tablet, Oral) Active. Aspirin (81MG  Tablet, Oral) Active. Atorvastatin Calcium (20MG  Tablet, Oral) Active. B Complex (Oral) Active. Vitamin D3 (5000UNIT Tablet, Oral) Active. Chromium Picolinate (800MCG Tablet, Oral) Active. ClonazePAM (1MG  Tablet, Oral) Active. Furosemide (40MG  Tablet, Oral) Active. Klor-Con M20 [facility] Tablet ER, Oral) Active. LamoTRIgine (200MG  Tablet, Oral) Active. Lyrica (300MG  Capsule, Oral) Active. Losartan Potassium (25MG  Tablet, Oral) Active. MetFORMIN HCl (500MG  Tablet, Oral) Active. Microgestin 1.5/30 (1.5-30MG -MCG Tablet, Oral)  Active. Nuedexta (20-10MG  Capsule, Oral) Active. Vraylar (6MG  Capsule, Oral) Active. Medications Reconciled  Social History Alcohol use Occasional alcohol use. Caffeine use Carbonated beverages. No drug use Tobacco use Never smoker.  Family History Alcohol Abuse Father. Family history unknown First Degree Relatives Hypertension Father.  Pregnancy / Birth History  Age at menarche 56 years. Age of menopause 38-50 Contraceptive History Oral contraceptives. Gravida 1 Irregular periods Para 0  Other Problems  Anxiety Disorder Arthritis Asthma Back Pain Congestive Heart Failure Depression Sleep Apnea   Review of Systems  General Present- Fatigue and Night Sweats. Not Present- Appetite Loss, Chills, Fever, Weight Gain and Weight Loss. Skin Present- New Lesions. Not Present- Change in Wart/Mole, Dryness, Hives, Jaundice, Non-Healing Wounds, Rash and Ulcer. HEENT Present- Seasonal Allergies and Wears glasses/contact lenses. Not Present- Earache, Hearing Loss, Hoarseness, Nose Bleed, Oral Ulcers, Ringing in the Ears, Sinus Pain, Sore Throat, Visual Disturbances and Yellow Eyes. Respiratory Present- Difficulty Breathing. Not Present- Bloody sputum, Chronic Cough, Snoring and Wheezing. Breast Not Present- Breast Mass, Breast Pain, Nipple Discharge and Skin Changes. Cardiovascular Present- Difficulty Breathing Lying Down and Shortness of Breath. Not Present- Chest Pain, Leg Cramps, Palpitations, Rapid Heart Rate and Swelling of Extremities. Gastrointestinal Present- Rectal Pain. Not Present- Abdominal Pain, Bloating, Bloody Stool, Change in Bowel Habits, Chronic diarrhea, Constipation, Difficulty Swallowing, Excessive gas, Gets full quickly at meals, Hemorrhoids, Indigestion, Nausea and Vomiting. Female Genitourinary Not Present- Frequency, Nocturia, Painful Urination, Pelvic Pain and Urgency. Musculoskeletal Present- Swelling of Extremities. Not Present- Back  Pain, Joint Pain, Joint Stiffness, Muscle Pain and Muscle Weakness. Psychiatric Present- Bipolar and Depression. Not Present- Anxiety, Change in Sleep Pattern, Fearful and Frequent crying. Hematology Not Present- Blood Thinners, Easy Bruising, Excessive bleeding, Gland problems, HIV and Persistent Infections.  Vitals  08/27/2017 3:27 PM Weight: 331 lb Height: 65in  Body Surface Area: 2.45 m Body Mass Index: 55.08 kg/m  Temp.: 98.71F  Pulse: 98 (Regular)    Physical Exam  GENERAL: Well-developed, obese female, diaphoretic  EYES: No scleral icterus Pupils equal, lids normal  EXTERNAL EARS: Intact, no masses or lesions EXTERNAL NOSE: Intact, no masses or lesions MOUTH: Lips - no lesions Dentition - normal for age  RESPIRATORY: Normal effort, no use of accessory muscles  MUSCULOSKELETAL: Normal gait Grossly normal ROM upper extremities Grossly normal ROM lower extremities  SKIN: Warm and dry Not diaphoretic  PSYCHIATRIC: Normal judgement and insight Normal mood and affect Alert, oriented x 3  Rectal  Left posterior buttock with about 4 cm x 4 cm area of unroofed skin and fluctuance surrounded by a large area of cellulitis and induration which spreads down the entire buttock into the perineum Very tender to palpation No active drainage on exam but there is evidence of prior drainage   Assessment & Plan  ABSCESS OF BUTTOCK, LEFT (L02.31) Impression: She is presenting to urgent office with a left buttock abscess and a large amount of surrounding cellulitis and induration. Dr. Excell Seltzer evaluated the patient and agrees that she needs IV antibiotics and I&D in the operating room. He is on call and has agreed to schedule her case tonight. She was advised to stay nothing by mouth. She has a bed ready yet was a long so she will go to the hospital now.  Carlena Hurl, [facility]

## 2017-08-27 NOTE — Anesthesia Preprocedure Evaluation (Addendum)
Anesthesia Evaluation  Patient identified by MRN, date of birth, ID band Patient awake    Reviewed: Allergy & Precautions, H&P , NPO status , Patient's Chart, lab work & pertinent test results  Airway Mallampati: III  TM Distance: >3 FB Neck ROM: Full    Dental no notable dental hx. (+) Teeth Intact, Dental Advisory Given   Pulmonary asthma , sleep apnea and Continuous Positive Airway Pressure Ventilation ,    Pulmonary exam normal breath sounds clear to auscultation       Cardiovascular hypertension, Pt. on medications +CHF   Rhythm:Regular Rate:Normal     Neuro/Psych Anxiety Depression Bipolar Disorder negative neurological ROS     GI/Hepatic negative GI ROS, Neg liver ROS,   Endo/Other  diabetes, Type 2, Oral Hypoglycemic AgentsMorbid obesity  Renal/GU negative Renal ROS  negative genitourinary   Musculoskeletal  (+) Arthritis , Osteoarthritis,    Abdominal   Peds  Hematology negative hematology ROS (+)   Anesthesia Other Findings   Reproductive/Obstetrics negative OB ROS                            Anesthesia Physical Anesthesia Plan  ASA: III  Anesthesia Plan: General   Post-op Pain Management:    Induction: Intravenous  PONV Risk Score and Plan: 3 and Ondansetron, Midazolam and Treatment may vary due to age or medical condition  Airway Management Planned: Oral ETT and Video Laryngoscope Planned  Additional Equipment:   Intra-op Plan:   Post-operative Plan: Extubation in OR  Informed Consent: I have reviewed the patients History and Physical, chart, labs and discussed the procedure including the risks, benefits and alternatives for the proposed anesthesia with the patient or authorized representative who has indicated his/her understanding and acceptance.   Dental advisory given  Plan Discussed with: CRNA  Anesthesia Plan Comments:        Anesthesia Quick  Evaluation

## 2017-08-28 ENCOUNTER — Encounter (HOSPITAL_COMMUNITY): Payer: Self-pay | Admitting: General Surgery

## 2017-08-28 LAB — HEMOGLOBIN A1C
Hgb A1c MFr Bld: 6.2 % — ABNORMAL HIGH (ref 4.8–5.6)
Mean Plasma Glucose: 131.24 mg/dL

## 2017-08-28 LAB — BASIC METABOLIC PANEL
Anion gap: 10 (ref 5–15)
BUN: 19 mg/dL (ref 6–20)
CHLORIDE: 101 mmol/L (ref 98–111)
CO2: 31 mmol/L (ref 22–32)
CREATININE: 1.02 mg/dL — AB (ref 0.44–1.00)
Calcium: 9 mg/dL (ref 8.9–10.3)
GFR calc Af Amer: >60 mL/min (ref >60)
GFR calc non Af Amer: >60 mL/min (ref >60)
Glucose, Bld: 203 mg/dL — ABNORMAL HIGH (ref 70–99)
POTASSIUM: 4.7 mmol/L (ref 3.5–5.1)
Sodium: 142 mmol/L (ref 135–145)

## 2017-08-28 LAB — CBC
HEMATOCRIT: 38.9 % (ref 36.0–46.0)
HEMOGLOBIN: 13.1 g/dL (ref 12.0–15.0)
MCH: 32.1 pg (ref 26.0–34.0)
MCHC: 33.7 g/dL (ref 30.0–36.0)
MCV: 95.3 fL (ref 78.0–100.0)
Platelets: 347 10*3/uL (ref 150–400)
RBC: 4.08 MIL/uL (ref 3.87–5.11)
RDW: 13.7 % (ref 11.5–15.5)
WBC: 14.1 10*3/uL — ABNORMAL HIGH (ref 4.0–10.5)

## 2017-08-28 LAB — GLUCOSE, CAPILLARY
GLUCOSE-CAPILLARY: 167 mg/dL — AB (ref 70–99)
Glucose-Capillary: 205 mg/dL — ABNORMAL HIGH (ref 70–99)

## 2017-08-28 MED ORDER — MORPHINE SULFATE (PF) 2 MG/ML IV SOLN
INTRAVENOUS | Status: AC
  Administered 2017-08-28: 2 mg via INTRAVENOUS
  Filled 2017-08-28: qty 1

## 2017-08-28 MED ORDER — MORPHINE SULFATE (PF) 2 MG/ML IV SOLN
2.0000 mg | INTRAVENOUS | Status: DC | PRN
  Administered 2017-08-29 – 2017-08-30 (×3): 4 mg via INTRAVENOUS
  Filled 2017-08-28 (×3): qty 2

## 2017-08-28 MED ORDER — ENOXAPARIN SODIUM 40 MG/0.4ML ~~LOC~~ SOLN
40.0000 mg | Freq: Two times a day (BID) | SUBCUTANEOUS | Status: DC
  Administered 2017-08-28 – 2017-08-29 (×3): 40 mg via SUBCUTANEOUS
  Filled 2017-08-28 (×3): qty 0.4

## 2017-08-28 MED ORDER — SODIUM CHLORIDE 0.9 % IV SOLN: INTRAVENOUS | Status: DC | PRN

## 2017-08-28 MED ORDER — JUVEN PO PACK
1.0000 | PACK | Freq: Two times a day (BID) | ORAL | Status: DC
Start: 2017-08-28 — End: 2017-08-31
  Administered 2017-08-28 – 2017-08-31 (×4): 1 via ORAL
  Filled 2017-08-28 (×7): qty 1

## 2017-08-28 MED ORDER — MORPHINE SULFATE (PF) 2 MG/ML IV SOLN: 2.0000 mg | Freq: Once | INTRAVENOUS | Status: AC

## 2017-08-28 MED ORDER — NORETHIN ACE-ETH ESTRAD-FE 1.5-30 MG-MCG PO TABS: 1.0000 | ORAL_TABLET | Freq: Every day | ORAL | Status: DC

## 2017-08-28 NOTE — Progress Notes (Signed)
Attempted to give patient insulin as ordered and as explaining the medication administration, patient began yelling and crying, refusing the insulin. PA walked in and was informed of the situation. Will continue to monitor glucose.

## 2017-08-28 NOTE — Progress Notes (Signed)
Initial Nutrition Assessment  DOCUMENTATION CODES:   Morbid obesity  INTERVENTION:   Provide Juven Fruit Punch BID, each serving provides 95kcal and 2.5g of protein (amino acids glutamine and arginine)  NUTRITION DIAGNOSIS:   Increased nutrient needs related to wound healing as evidenced by estimated needs.  GOAL:   Patient will meet greater than or equal to 90% of their needs  MONITOR:   PO intake, Supplement acceptance, Labs, Weight trends, Skin, I & O's  REASON FOR ASSESSMENT:   Malnutrition Screening Tool   ASSESSMENT:   47 year old female with multiple medical problems including morbid obesity and diabetes who presents with a 6-day history of increasing pain and swelling and tenderness over her left buttock.  7/30: s/p INCISION AND DRAINAGE AND DEBRIDEMENT LEFT BUTTOCK AND PERINEAL ABSCESS   Patient consumed 95% of breakfast this morning. Currently refusing insulin which may impact healing of left buttock ulcer. RD ordered Juven supplements to aid wound healing.   Per chart review, weight is stable. Pt is receiving Lasix, will monitor weight trends.   Medications: B5-complex w/ Vitamin C tablet daily, Vitamin D tablet daily, Lasix tablet BID, MAG-OX tablet daily, Multivitamin with minerals daily, K-DUR tablet daily, Zinc sulfate capsule daily Labs reviewed: CBG: 167   NUTRITION - FOCUSED PHYSICAL EXAM:  Nutrition focused physical exam shows no sign of depletion of muscle mass or body fat.  Diet Order:   Diet Order           Diet Carb Modified Fluid consistency: Thin; Room service appropriate? Yes  Diet effective now          EDUCATION NEEDS:   Not appropriate for education at this time  Skin:  Skin Assessment: Skin Integrity Issues: Skin Integrity Issues:: Other (Comment) Other: left buttock ulcer  Last BM:  7/30  Height:   Ht Readings from Last 1 Encounters:  08/27/17 5\' 5"  (1.651 m)    Weight:   Wt Readings from Last 1 Encounters:  08/27/17  (!) 335 lb (152 kg)    Ideal Body Weight:  56.8 kg  BMI:  Body mass index is 55.75 kg/m.  Estimated Nutritional Needs:   Kcal:  2000-2200  Protein:  85-95g  Fluid:  2L/day  Clayton Bibles, MS, RD, LDN [place] Dietitian Pager: [phone] After Hours Pager: [phone]

## 2017-08-28 NOTE — Progress Notes (Signed)
1 Day Post-Op    CC: Buttocks/perineal abscess  Subjective: Patient loudly refusing insulin this a.m. she is normally on Glucophage at home she has ordered twice daily she only takes it once is very emphatic about how she wants to do things.  Dressing change was very painful.  I got some packing into the each end of the 2 open areas but not nearly as much as before.  Picture below  Objective: Vital signs in last 24 hours: Temp:  [98 F (36.7 C)-99.6 F (37.6 C)] 98 F (36.7 C) (07/31 0257) Pulse Rate:  [71-94] 71 (07/31 0257) Resp:  [10-20] 14 (07/31 0257) BP: (99-131)/(54-104) 108/67 (07/31 0257) SpO2:  [90 %-97 %] 94 % (07/31 0257) Weight:  [152 kg (335 lb)] 152 kg (335 lb) (07/30 1820) Last BM Date: 08/27/17 40 p.o. Recorded 1500 IV 100 urine Afebrile vital signs are stable. Labs: Creatinine is mildly elevated at 1.02 down from 1.23. Glucose 150-203 early refusing sliding scale insulin. WBC 14.1, H/H: 13.1/38.9, platelets 347,000  Intake/Output from previous day: 07/30 0701 - 07/31 0700 In: 8338 [P.O.:40; I.V.:1408; IV Piggyback:100] Out: 600 [Urine:600] Intake/Output this shift: No intake/output data recorded.  General appearance: alert and Loud, refusing sliding scale insulin, insistent on doing things her way. Resp: clear to auscultation bilaterally Skin: Skin color, texture, turgor normal. No rashes or lesions or see picture   This AM    In the OR Lab Results:  Recent Labs    08/27/17 1828 08/28/17 0355  WBC 17.9* 14.1*  HGB 13.9 13.1  HCT 41.0 38.9  PLT 329 347    BMET Recent Labs    08/27/17 2135 08/28/17 0355  NA 142 142  K 5.0 4.7  CL 100 101  CO2 28 31  GLUCOSE 170* 203*  BUN 18 19  CREATININE 1.23* 1.02*  CALCIUM 9.4 9.0   PT/INR No results for input(s): LABPROT, INR in the last 72 hours.  Recent Labs  Lab 08/27/17 2135  AST 21  ALT 22  ALKPHOS 109  BILITOT 0.6  PROT 7.4  ALBUMIN 2.9*     Lipase  No results found for:  LIPASE   Medications: . ALPRAZolam  1 mg Oral TID  . atorvastatin  20 mg Oral QPM  . B-complex with vitamin C  1 tablet Oral Daily  . cariprazine  6 mg Oral QPM  . cholecalciferol  3,000 Units Oral Daily  . clonazePAM  1 mg Oral TID  . Dextromethorphan-quiNIDine  1 tablet Oral Q12H  . enoxaparin (LOVENOX) injection  40 mg Subcutaneous Q24H  . furosemide  80 mg Oral BID  . insulin aspart  0-15 Units Subcutaneous TID WC  . lamoTRIgine  200 mg Oral BID  . losartan  25 mg Oral BID  . magnesium oxide  400 mg Oral Daily  . meloxicam  7.5 mg Oral Q12H  . multivitamin with minerals  1 tablet Oral Daily  . mupirocin ointment  1 application Nasal BID  . potassium chloride SA  20 mEq Oral Daily  . pregabalin  300 mg Oral Daily  . zinc sulfate  220 mg Oral Daily   . 0.9 % NaCl with KCl 20 mEq / L    . 0.9 % NaCl with KCl 20 mEq / L 50 mL/hr at 08/28/17 0600  . piperacillin-tazobactam (ZOSYN)  IV 3.375 g (08/28/17 0844)   Anti-infectives (From admission, onward)   Start     Dose/Rate Route Frequency Ordered Stop   08/27/17 1850  piperacillin-tazobactam (ZOSYN) IVPB 3.375 g     3.375 g 12.5 mL/hr over 240 Minutes Intravenous Every 8 hours 08/27/17 1817     08/27/17 1813  piperacillin-tazobactam (ZOSYN) 3.375 GM/50ML IVPB    Note to Pharmacy:  Enrigue Catena   : cabinet override      08/27/17 1813 08/27/17 1850    Assessment/Plan Type 2 diabetes Sleep apnea with CPAP Hx of bipolar disease/Hx mania/depression/panic attacks Hypertension Hyperlipidemia BMI 55.7  Complex left buttocks and perineal abscess with early soft tissue necrosis Incision, drainage, and debridement of left buttocks and perineal abscess, 08/27/2017, Dr. [name] [place]  FEN: IV fluids/carb modified ID: [id number] =>> day 2 DVT: Lovenox Follow-up: Dr. Excell Seltzer  Plan: We would like to do dressing changes twice daily, work on controlling her glucoses.  She is refusing a sliding scale insulin.  I am going to ask the  diabetes coordinator to see, will check a hemoglobin A1c.  We used 4 mg of IV morphine in 2 divided doses to do the dressing change this morning.  I have packing in each side but they are not contiguous.  We will try and do the dressing changes evening with the nursing staff.  We may have to take her back to do a good exam in the next 24 to 48 hours.  Medicine consult to assist with Medical issues      LOS: 1 day    Deeanna Beightol 08/28/2017 [phone]

## 2017-08-28 NOTE — Progress Notes (Addendum)
ANTICOAGULATION CONSULT NOTE - Initial Consult  Pharmacy Consult for enoxaparin Indication: VTE prophylaxis  Allergies  Allergen Reactions  . Savella [Milnacipran Hcl]     mania    Patient Measurements: Height: 5\' 5"  (165.1 cm) Weight: (!) 335 lb (152 kg) IBW/kg (Calculated) : 57 Heparin Dosing Weight:   Vital Signs: Temp: 98 F (36.7 C) (07/31 0257) Temp Source: Oral (07/31 0257) BP: 134/58 (07/31 0954) Pulse Rate: 68 (07/31 0954)  Labs: Recent Labs    08/26/17 1613 08/27/17 1828 08/27/17 2135 08/28/17 0355  HGB 13.1 13.9  --  13.1  HCT 40.1 41.0  --  38.9  PLT 274.0 329  --  347  CREATININE  --   --  1.23* 1.02*    Estimated Creatinine Clearance: 102.3 mL/min (A) (by C-G formula based on SCr of 1.02 mg/dL (H)).   Medical History: Past Medical History:  Diagnosis Date  . Acute systolic congestive heart failure ([name])   . Agoraphobia   . Anxiety    panic attacks  . Arthritis    osteoarthritis bilal. knees  . Asthma    no per PFT 7/13; reports does not have asthma  . Bipolar disorder ([place])   . Carpal tunnel syndrome of left wrist 05/2011   Being evaluated for MS  . Cor pulmonale ([place])   . Depression   . History of pleural effusion   . Hyperlipidemia   . Hypertension   . Hypoxemia    history of - no home O2  . IBS (irritable bowel syndrome)   . Morbid obesity ([place])   . Obesity hypoventilation syndrome ([place])   . Prediabetes   . Seasonal allergies    current runny nose  . Seizures ([place])    febrile seizure x 1 as a child. Several times  . Sleep apnea sleep study 07/02/2010   uses CPAP nightly  . Spinal stenosis in cervical region   . Urinary incontinence     Assessment: 89 YOF with L butt abscess and perineal abscess.  Patient is obese with BMI = 55.8.  Pharmacy asked to dose enoxaparin for VTE prophylaxis.  Today, 08/28/2017  Renal: SCr = 1.03 (CrCl > 30 ml/min)  CBCL Hgb and pltc WNL  Goal of Therapy:  Anti-Xa level 0.3-0.5 units/ml 4hrs  after LMWH dose given   Plan:   Change Enoxaparin from q24h dosing to Enoxaparin 40mg  SQ q12h for obesity  Monitor renal function and CBC  Pharmacy to follow at distance  Doreene Eland, PharmD, BCPS.   Pager: [phone] 08/28/2017 10:19 AM

## 2017-08-28 NOTE — Consult Note (Signed)
[facility] Medical Consultation  [name] WJX:[id number] DOB: 1970-10-27 DOA: 08/27/2017 PCP: Lucretia Kern, DO   Requesting physician: [place] Date of consultation: 08/28/17 Reason for consultation: Diabetes and chronic medical issues  Impression/Recommendations Principal Problem:   Left buttock abscess Active Problems:   OSA (obstructive sleep apnea)   Obesity hypoventilation syndrome ([place])   Cardiomyopathy ([place])   Obesity   Bipolar disorder ([place])   DM2 (diabetes mellitus, type 2) ([place])    1. Left Buttocks and perineal abscess: s/p I&D by surgery.  Abx and treatment per surgery, may go back to OR within 24-48 hours.  Leukocytosis with this as well, follow.  2. T2DM: Last A1c 6.3.  POC BG's have been less than 180, but on BMP this morning it was ~200.  She's refusing sliding scale insulin.  Discussed rationale for insulin in house and holding metformin while inpatient.  Discussed importance of BG control for wound healing and avoiding infections, but she states she won't take sliding scale insulin while she's here.  Discussed potentially starting once [name] day lantus while checking AM fasting BG's while she's here and she declined that as well.  Will follow up repeat A1c.  She's declining any insulin and blood sugar checks while she's here.  Will continue to follow AM BG's on morning labs.  Discuss additionally if BG's worsening based on this.  Diabetic coordinator to see.    3. HFpEF  Pulm HTN: follows with cards as outpatient.  Appears euvolemic.  Continue lasix.  4. Bipolar Disorder:  Continue home lamictal, xanax, klonipin, vraylar.  She was occasionally upset and agitated during our interview.  Husband notes being away from home and out of normal routine difficult for her.  Follows with Dr. Toy Care as outpatient.  No SI.    5. Hypertension: losartan, lasix  6. OHS  OSA: nightly CPAP, follow  7. Continue birth control pill for control of menstrual cycles -  discussed risk of VTE which she notes she's aware of, discussed continuing to discuss this with primary prescriber  8. Continue MVI: of note, she's on high dose of vitamin D at home.  Will check vitamin D level here.  Discussed following up with outpatient provider to ensure this dose is appropriate.  9. She's on high dose of tylenol (2 g twice daily at home), would discuss decreasing this dose prior to d/c (consider 1 g TID)  I will followup again tomorrow. Please contact me if I can be of assistance in the meanwhile. Thank you for this consultation.  Chief Complaint: L buttock abscess  HPI:  47 year old with [name] history of bipolar disorder, heart failure, obesity hypoventilation, OSA, hypertension and several other medical problems who presented with L buttocks and perineal abscess now s/p I&D by surgery on 7/30.  She's on broad spectrum abx with zosyn.    Her symptoms started 4 to 5 days ago.  She noticed [name] pimple on her upper left buttock.  She initially tried Neosporin and that it was getting better, but then noticed worsening redness and swelling.  She presented to her primary care provider's office on Monday and was seen by surgery yesterday.  Medicine was consulted today for assistance in caring for her diabetes and other chronic medical problems.  This morning she was refusing sliding scale insulin for her diabetes.  She notes [name] history of bipolar disorder.  She denies any thoughts of self-harm or SI.  She notes frustration with her overall situation.  Her husband notes that  being out of her routine is very difficult for her.  Review of Systems:  No CP, SOB, nausea, vomiting, abdominal pain.  [name] complete review of systems is otherwise negative except as noted per HPI.  Past Medical History:  Diagnosis Date  . Acute systolic congestive heart failure ([place])   . Agoraphobia   . Anxiety    panic attacks  . Arthritis    osteoarthritis bilal. knees  . Asthma    no per PFT 7/13; reports  does not have asthma  . Bipolar disorder ([place])   . Carpal tunnel syndrome of left wrist 05/2011   Being evaluated for MS  . Cor pulmonale ([place])   . Depression   . History of pleural effusion   . Hyperlipidemia   . Hypertension   . Hypoxemia    history of - no home O2  . IBS (irritable bowel syndrome)   . Morbid obesity ([place])   . Obesity hypoventilation syndrome ([place])   . Prediabetes   . Seasonal allergies    current runny nose  . Seizures ([place])    febrile seizure x 1 as [name] child. Several times  . Sleep apnea sleep study 07/02/2010   uses CPAP nightly  . Spinal stenosis in cervical region   . Urinary incontinence    Past Surgical History:  Procedure Laterality Date  . CARPAL TUNNEL RELEASE  06/27/2011   Procedure: CARPAL TUNNEL RELEASE;  Surgeon: Wynonia Sours, MD;  Location: [place];  Service: Orthopedics;  Laterality: Left;  . CARPAL TUNNEL RELEASE  12/19/2011   Procedure: CARPAL TUNNEL RELEASE;  Surgeon: Wynonia Sours, MD;  Location: [facility];  Service: Orthopedics;  Laterality: Right;  . INCISION AND DRAINAGE ABSCESS Left 08/27/2017   Procedure: INCISION AND DRAINAGE LEFT BUTTOCK  ABSCESS;  Surgeon: Excell Seltzer, MD;  Location: [facility];  Service: General;  Laterality: Left;  [name] MASS EXCISION  08/07/2010   right index  . POSTERIOR CERVICAL FUSION/FORAMINOTOMY  09/11/2011   Procedure: POSTERIOR CERVICAL FUSION/FORAMINOTOMY LEVEL 1;  Surgeon: Erline Levine, MD;  Location: [facility];  Service: Neurosurgery;  Laterality: N/[name];  Cervical Three-Four Posteior Cervical Fusion and Decompression.  [name] TRIGGER FINGER RELEASE  12/19/2011   Procedure: RELEASE TRIGGER FINGER/[name]-1 PULLEY;  Surgeon: Wynonia Sours, MD;  Location: [facility];  Service: Orthopedics;  Laterality: Right;  . TRIGGER FINGER RELEASE Left 12/31/2012   Procedure: RELEASE [name]-1 PULLEY LEFT THUMB;  Surgeon: Wynonia Sours, MD;  Location: [facility];  Service:  Orthopedics;  Laterality: Left;  . WISDOM TOOTH EXTRACTION     Social History:  reports that she has never smoked. She has never used smokeless tobacco. She reports that she drinks alcohol. She reports that she does not use drugs.  Allergies  Allergen Reactions  . Savella [Milnacipran Hcl]     mania   Family History  Problem Relation Age of Onset  . Asthma Mother   . Allergic rhinitis Mother   . Multiple sclerosis Mother   . Asthma Maternal Grandmother     Prior to Admission medications   Medication Sig Start Date End Date Taking? Authorizing Provider  acetaminophen (TYLENOL) 500 MG tablet Take 2,000 mg by mouth 2 (two) times daily. Taking every day per patient   Yes [provider]  albuterol (PROVENTIL HFA;VENTOLIN HFA) 108 (90 Base) MCG/ACT inhaler Inhale 2 puffs into the lungs every 6 (six) hours as needed for wheezing or shortness of breath. 11/24/15  Yes Lucretia Kern, DO  ALPRAZolam Duanne Moron) 1 MG tablet Take 1 mg by mouth 3 (three) times daily.    Yes [provider]  amoxicillin-clavulanate (AUGMENTIN) 875-125 MG tablet Take 1 tablet by mouth 2 (two) times daily for 7 days. 08/26/17 09/02/17 Yes Martinique, Betty G, MD  Ascorbic Acid (VITAMIN C) 500 MG tablet Take 1,000 mg by mouth daily.    Yes [provider]  aspirin 81 MG tablet Take 81 mg by mouth daily.    Yes [provider]  atorvastatin (LIPITOR) 20 MG tablet Take 1 tablet (20 mg total) by mouth every evening. 08/29/16  Yes Minus Breeding, MD  b complex vitamins tablet Take 1 tablet by mouth daily.   Yes [provider]  Cholecalciferol (VITAMIN D3) 5000 UNITS TABS Take 4 tablets by mouth daily.    Yes [provider]  Chromium Picolinate 800 MCG TABS Take 800 mcg by mouth daily.   Yes [provider]  clonazePAM (KLONOPIN) 1 MG tablet Take 1 tablet (1 mg total) by mouth 3 (three) times daily. 05/06/15  Yes Allie Bossier, MD  fluticasone (FLONASE) 50 MCG/ACT nasal  spray Place 1 spray into both nostrils daily. 08/28/16  Yes Colin Benton R, DO  furosemide (LASIX) 40 MG tablet Take 2 tablets (80 mg total) by mouth 2 (two) times daily. 08/22/17  Yes Minus Breeding, MD  KLOR-CON M20 20 MEQ tablet TAKE 1 TABLET DAILY. PLEASESCHEDULE AN APPOINTMENT FORFUTURE REFILLS 08/26/17  Yes Minus Breeding, MD  lamoTRIgine (LAMICTAL) 200 MG tablet Take 200 mg by mouth 2 (two) times daily.    Yes [provider]  lidocaine (LIDODERM) 5 % Place 1 patch onto the skin daily as needed (for knee pain). Remove & Discard patch within 12 hours or as directed by MD 08/28/16  Yes Lucretia Kern, DO  losartan (COZAAR) 25 MG tablet Take 1 tablet (25 mg total) by mouth 2 (two) times daily. 08/29/16  Yes [name], Jeneen Rinks, MD  LYRICA 300 MG capsule Take 1 tablet by mouth Twice daily. 03/26/11  Yes [provider]  MAGNESIUM PO Take 1 tablet by mouth daily.   Yes [provider]  Meloxicam (MOBIC PO) Take 7.5 mg by mouth every 12 (twelve) hours.    Yes [provider]  metFORMIN (GLUCOPHAGE) 500 MG tablet Take 1 tablet (500 mg total) by mouth 2 (two) times daily with Mitra Duling meal. 08/12/17  Yes Lucretia Kern, DO  Multiple Vitamin (MULTIVITAMIN) tablet Take 1 tablet by mouth daily.     Yes [provider]  norethindrone-ethinyl estradiol-iron (MICROGESTIN FE 1.5/30) 1.5-30 MG-MCG tablet Take 1 tablet by mouth at bedtime. Takes continuously   Yes [provider]  NUEDEXTA 20-10 MG CAPS Take 1 tablet by mouth every 12 (twelve) hours. 09/27/16  Yes [provider]  VRAYLAR 6 MG CAPS Take 1 tablet by mouth every evening. 09/03/16  Yes [provider]  Zinc 50 MG CAPS Take 50 mg by mouth daily.   Yes [provider]   Physical Exam: Blood pressure (!) 134/58, pulse 68, temperature 98 F (36.7 C), temperature source Oral, resp. rate 17, height 5\' 5"  (1.651 m), weight (!) 152 kg (335 lb), SpO2 (!) 89 %. Vitals:   08/28/17 0257 08/28/17  0954  BP: 108/67 (!) 134/58  Pulse: 71 68  Resp: 14 17  Temp: 98 F (36.7 C)   SpO2: 94% (!) 89%     General:  NAD, occasionally agitated  Eyes: PERRL, anicteric  ENT: Moist mucus membranes  Neck: Supple  Cardiovascular: RRR, no mgr  Respiratory: CTAB, no increased wob  Abdomen: protuberant, s/nt/nd  Skin: no appreciable rash or wound.  Did not examine buttocks today, reviewed images from surgery note.  Musculoskeletal: moving all extremities equally  Psychiatric: Denies SI.  Notes frustration.  Neurologic: CN 2-12 grossly intact, moving all extremities equally  Labs on Admission:  Basic Metabolic Panel: Recent Labs  Lab 08/27/17 2135 08/28/17 0355  NA 142 142  K 5.0 4.7  CL 100 101  CO2 28 31  GLUCOSE 170* 203*  BUN 18 19  CREATININE 1.23* 1.02*  CALCIUM 9.4 9.0   Liver Function Tests: Recent Labs  Lab 08/27/17 2135  AST 21  ALT 22  ALKPHOS 109  BILITOT 0.6  PROT 7.4  ALBUMIN 2.9*   No results for input(s): LIPASE, AMYLASE in the last 168 hours. No results for input(s): AMMONIA in the last 168 hours. CBC: Recent Labs  Lab 08/26/17 1613 08/27/17 1828 08/28/17 0355  WBC 14.8* 17.9* 14.1*  NEUTROABS 12.0* 14.3*  --   HGB 13.1 13.9 13.1  HCT 40.1 41.0 38.9  MCV 96.1 94.7 95.3  PLT 274.0 329 347   Cardiac Enzymes: No results for input(s): CKTOTAL, CKMB, CKMBINDEX, TROPONINI in the last 168 hours. BNP: Invalid input(s): POCBNP CBG: Recent Labs  Lab 08/27/17 1822 08/27/17 2052 08/28/17 0813  GLUCAP 139* 150* 167*    Radiological Exams on Admission: No results found.  EKG: Independently reviewed. none  Time spent: over 1 hour  [facility] Pager [phone]  If 7PM-7AM, please contact night-coverage [email or web address] Password [facility] 08/28/2017, 11:39 AM

## 2017-08-28 NOTE — Progress Notes (Signed)
Inpatient Diabetes Program Recommendations  AACE/ADA: New Consensus Statement on Inpatient Glycemic Control (2015)  Target Ranges:  Prepandial:   less than 140 mg/dL      Peak postprandial:   less than 180 mg/dL (1-2 hours)      Critically ill patients:  140 - 180 mg/dL   Lab Results  Component Value Date   GLUCAP 205 (H) 08/28/2017   HGBA1C 6.2 (H) 08/28/2017    Review of Glycemic Control  Diabetes history: Pre-diabetes Outpatient Diabetes medications: metformin 500 mg bid Current orders for Inpatient glycemic control:  None - sliding scale d/ced  Spoke with pt about her pre-diabetes, HgbA1C results and importance of taking insulin as prescribed while being inpatient. Pt was adament that she was not going to take insulin and asked me to leave. States "Your're wasting my time."  Will not follow.  Thank you. Lorenda Peck, RD, LDN, CDE Inpatient Diabetes Coordinator [phone]

## 2017-08-29 DIAGNOSIS — L0231 Cutaneous abscess of buttock: Principal | ICD-10-CM

## 2017-08-29 LAB — CBC
HCT: 39.4 % (ref 36.0–46.0)
HEMOGLOBIN: 13.1 g/dL (ref 12.0–15.0)
MCH: 31.5 pg (ref 26.0–34.0)
MCHC: 33.2 g/dL (ref 30.0–36.0)
MCV: 94.7 fL (ref 78.0–100.0)
Platelets: 341 10*3/uL (ref 150–400)
RBC: 4.16 MIL/uL (ref 3.87–5.11)
RDW: 13.8 % (ref 11.5–15.5)
WBC: 8.8 10*3/uL (ref 4.0–10.5)

## 2017-08-29 LAB — BASIC METABOLIC PANEL
Anion gap: 12 (ref 5–15)
BUN: 21 mg/dL — AB (ref 6–20)
CALCIUM: 8.6 mg/dL — AB (ref 8.9–10.3)
CO2: 28 mmol/L (ref 22–32)
Chloride: 102 mmol/L (ref 98–111)
Creatinine, Ser: 0.88 mg/dL (ref 0.44–1.00)
GFR calc Af Amer: >60 mL/min (ref >60)
GFR calc non Af Amer: >60 mL/min (ref >60)
Glucose, Bld: 134 mg/dL — ABNORMAL HIGH (ref 70–99)
POTASSIUM: 4.9 mmol/L (ref 3.5–5.1)
Sodium: 142 mmol/L (ref 135–145)

## 2017-08-29 MED ORDER — INSULIN ASPART 100 UNIT/ML ~~LOC~~ SOLN: 0.0000 [IU] | Freq: Three times a day (TID) | SUBCUTANEOUS | Status: DC

## 2017-08-29 NOTE — Progress Notes (Signed)
2 Days Post-Op    CC:  Buttocks/perineal abscess  Subjective: Still having allot of drainage from her wounds.  The erythema is better.  She still has a lot of discomfort with the dressing change; site is very tender.   Objective: Vital signs in last 24 hours: Temp:  [98.4 F (36.9 C)-98.5 F (36.9 C)] 98.5 F (36.9 C) (08/01 0523) Pulse Rate:  [62-72] 67 (08/01 0818) Resp:  [13-18] 17 (08/01 0523) BP: (106-168)/(55-77) 106/55 (08/01 0818) SpO2:  [89 %-98 %] 94 % (08/01 0818) Last BM Date: 08/27/17 490 PO recorded 1100 IV No BM Afebrile, VSS Labs are fine.  A1C 6.2  Intake/Output from previous day: 07/31 0701 - 08/01 0700 In: 1685.9 [P.O.:490; I.V.:1078.8; IV Piggyback:117.1] Out: 1100 [Urine:1100] Intake/Output this shift: Total I/O In: -  Out: 500 [Urine:500]  General appearance: alert, cooperative and no distress Resp: clear to auscultation bilaterally Skin: ongoing drainage from the sites.  Very tender.  less erythema  Lab Results:  Recent Labs    08/28/17 0355 08/29/17 0413  WBC 14.1* 8.8  HGB 13.1 13.1  HCT 38.9 39.4  PLT 347 341    BMET Recent Labs    08/28/17 0355 08/29/17 0413  NA 142 142  K 4.7 4.9  CL 101 102  CO2 31 28  GLUCOSE 203* 134*  BUN 19 21*  CREATININE 1.02* 0.88  CALCIUM 9.0 8.6*   PT/INR No results for input(s): LABPROT, INR in the last 72 hours.  Recent Labs  Lab 08/27/17 2135  AST 21  ALT 22  ALKPHOS 109  BILITOT 0.6  PROT 7.4  ALBUMIN 2.9*     Lipase  No results found for: LIPASE   Medications: . ALPRAZolam  1 mg Oral TID  . atorvastatin  20 mg Oral QPM  . B-complex with vitamin C  1 tablet Oral Daily  . cariprazine  6 mg Oral QPM  . cholecalciferol  3,000 Units Oral Daily  . clonazePAM  1 mg Oral TID  . Dextromethorphan-quiNIDine  1 tablet Oral Q12H  . enoxaparin (LOVENOX) injection  40 mg Subcutaneous Q12H  . furosemide  80 mg Oral BID  . insulin aspart  0-9 Units Subcutaneous TID WC  . lamoTRIgine   200 mg Oral BID  . losartan  25 mg Oral BID  . magnesium oxide  400 mg Oral Daily  . meloxicam  7.5 mg Oral Q12H  . multivitamin with minerals  1 tablet Oral Daily  . mupirocin ointment  1 application Nasal BID  . norethindrone-ethinyl estradiol-iron  1 tablet Oral QHS  . nutrition supplement (JUVEN)  1 packet Oral BID BM  . potassium chloride SA  20 mEq Oral Daily  . pregabalin  300 mg Oral Daily  . zinc sulfate  220 mg Oral Daily    Assessment/Plan  Type 2 diabetes Sleep apnea with CPAP Hx of bipolar disease/Hx mania/depression/panic attacks Hypertension Hyperlipidemia BMI 55.7  Complex left buttocks and perineal abscess with early soft tissue necrosis Incision, drainage, and debridement of left buttocks and perineal abscess, 08/27/2017, Dr. [name] [facility]  FEN: IV fluids/carb modified ID: [id number] =>> day 3 DVT: Lovenox Follow-up: Dr. Excell Seltzer  Plan:  She needs Morphine IV for dressing change and it's very painful for her.  WE will continue antibiotics and daily dressing changes.      LOS: 2 days    Carlie Corpus 08/29/2017 [phone]

## 2017-08-29 NOTE — Progress Notes (Signed)
Patient Demographics:    [name], is a 48 y.o. female, DOB - January 12, 1971, BRA:[id number]  Admit date - 08/27/2017   Admitting Physician Excell Seltzer, MD  Outpatient Primary MD for the patient is Lucretia Kern, DO  LOS - 2   No chief complaint on file.       Subjective:    [name] today has no fevers, no emesis,  No chest pain, husband and father at bedside, questions answered  Assessment  & Plan :    Principal Problem:   Left buttock abscess Active Problems:   OSA (obstructive sleep apnea)   Obesity hypoventilation syndrome (H[facility])   Cardiomyopathy ([place])   Obesity   Bipolar disorder ([place])   DM2 (diabetes mellitus, type 2) ([place])   1)Left Buttocks and perineal abscess: s/p I&D on 08/27/17.    IV Zosyn and dressing changes per surgical team , leukocytosis is resolved, no fevers , follow clinical response, patient may need further debridement  2)T2DM: Last A1c 6.3.    Patient refusing Accu-Cheks and sliding scale coverage, Diabetic coordinator to see.    3)HFpEF  Pulm HTN: follows with cards as outpatient.  Appears euvolemic.,  Appears compensated continue lasix twice daily,.  4)Bipolar Disorder:   Stable, patient does have anxiety concerns from time to time, continue home lamictal, xanax, klonipin, vraylar.  Follows with Dr. Toy Care as outpatient.  No SI.    5)Hypertension: Stable, continue losartan 25 mg twice daily,, lasix  6. OHS  OSA: nightly CPAP, follow  7)HRT--- currently for menorrhagia , patient has morbid obesity, sedentary lifestyle, and she takes HRT, high risk for DVT/VT, patient and husband verbalized understanding, SCDs encouraged ,  discussed continuing to discuss this with primary prescriber  Code Status : full code   Disposition Plan  : home   Consults  : Surgery is primary team   DVT Prophylaxis  :  Lovenox /scd  Lab Results  Component  Value Date   PLT 341 08/29/2017    Inpatient Medications  Scheduled Meds: . ALPRAZolam  1 mg Oral TID  . atorvastatin  20 mg Oral QPM  . B-complex with vitamin C  1 tablet Oral Daily  . cariprazine  6 mg Oral QPM  . cholecalciferol  3,000 Units Oral Daily  . clonazePAM  1 mg Oral TID  . Dextromethorphan-quiNIDine  1 tablet Oral Q12H  . enoxaparin (LOVENOX) injection  40 mg Subcutaneous Q12H  . furosemide  80 mg Oral BID  . insulin aspart  0-9 Units Subcutaneous TID WC  . lamoTRIgine  200 mg Oral BID  . losartan  25 mg Oral BID  . magnesium oxide  400 mg Oral Daily  . meloxicam  7.5 mg Oral Q12H  . multivitamin with minerals  1 tablet Oral Daily  . mupirocin ointment  1 application Nasal BID  . norethindrone-ethinyl estradiol-iron  1 tablet Oral QHS  . nutrition supplement (JUVEN)  1 packet Oral BID BM  . potassium chloride SA  20 mEq Oral Daily  . pregabalin  300 mg Oral Daily  . zinc sulfate  220 mg Oral Daily   Continuous Infusions: . sodium chloride    . piperacillin-tazobactam (ZOSYN)  IV 3.375 g (08/29/17 0819)   PRN Meds:.sodium chloride,  albuterol, fluticasone, morphine injection, ondansetron **OR** ondansetron (ZOFRAN) IV, oxyCODONE    Anti-infectives (From admission, onward)   Start     Dose/Rate Route Frequency Ordered Stop   08/27/17 1850  piperacillin-tazobactam (ZOSYN) IVPB 3.375 g     3.375 g 12.5 mL/hr over 240 Minutes Intravenous Every 8 hours 08/27/17 1817     08/27/17 1813  piperacillin-tazobactam (ZOSYN) 3.375 GM/50ML IVPB    Note to Pharmacy:  Enrigue Catena   : cabinet override      08/27/17 1813 08/27/17 1850        Objective:   Vitals:   08/29/17 0523 08/29/17 0818 08/29/17 1147 08/29/17 1403  BP: (!) 168/77 (!) 106/55 125/70 (!) 147/81  Pulse: 65 67 63 60  Resp: 17   18  Temp: 98.5 F (36.9 C)   (!) 97.3 F (36.3 C)  TempSrc: Oral   Oral  SpO2: 98% 94%  98%  Weight:      Height:        Wt Readings from Last 3 Encounters:  08/27/17  (!) 152 kg (335 lb)  08/26/17 (!) 152.2 kg (335 lb 8 oz)  07/24/17 (!) 151.5 kg (334 lb)     Intake/Output Summary (Last 24 hours) at 08/29/2017 1641 Last data filed at 08/29/2017 1400 Gross per 24 hour  Intake 1502.83 ml  Output 1700 ml  Net -197.17 ml     Physical Exam  Gen:- Awake Alert,  Obese, in no apparent distress  HEENT:- [place].AT, No sclera icterus Neck-Supple Neck,No JVD,.  Lungs-  CTAB , good air movement CV- S1, S2 normal Abd-  +ve B.Sounds, Abd Soft, No tenderness,   increased truncal adiposity Extremity:- No  edema,   negative Homans Psych-affect is appropriate, oriented x3 Neuro-no new focal deficits, no tremors Skin-Lt buttock and perineal wounds with dressing/packing  Data Review:   Micro Results Recent Results (from the past 240 hour(s))  Surgical PCR screen     Status: Abnormal   Collection Time: 08/27/17  6:28 PM  Result Value Ref Range Status   MRSA, PCR NEGATIVE NEGATIVE Final   Staphylococcus aureus POSITIVE (A) NEGATIVE Final    Comment: (NOTE) The Xpert SA Assay (FDA approved for NASAL specimens in patients 46 years of age and older), is one component of a comprehensive surveillance program. It is not intended to diagnose infection nor to guide or monitor treatment. Performed at [facility], [place] [address] [place], [place] [address]   Aerobic/Anaerobic Culture (surgical/deep wound)     Status: None (Preliminary result)   Collection Time: 08/27/17  7:01 PM  Result Value Ref Range Status   Specimen Description   Final    ABSCESS LEFT BUTTOCKS Performed at [facility] [address], [place], [place] [address]    Special Requests   Final    NONE Performed at [facility], [place] [address] [place], [place] [address]    Gram Stain   Final    ABUNDANT WBC PRESENT, PREDOMINANTLY PMN MODERATE GRAM POSITIVE COCCI Performed at [facility], [place] [address] [place], [place] [address]      Culture ABUNDANT STAPHYLOCOCCUS AUREUS  Final   Report Status PENDING  Incomplete    Radiology Reports No results found.   CBC Recent Labs  Lab 08/26/17 1613 08/27/17 1828 08/28/17 0355 08/29/17 0413  WBC 14.8* 17.9* 14.1* 8.8  HGB 13.1 13.9 13.1 13.1  HCT 40.1 41.0 38.9 39.4  PLT 274.0 329 347 341  MCV 96.1 94.7 95.3 94.7  MCH  --  32.1 32.1 31.5  MCHC 32.6 33.9 33.7 33.2  RDW 14.1 13.8 13.7 13.8  LYMPHSABS 1.8 2.1  --   --   MONOABS 0.8 1.3*  --   --   EOSABS 0.2 0.2  --   --   BASOSABS 0.0 0.0  --   --     Chemistries  Recent Labs  Lab 08/27/17 2135 08/28/17 0355 08/29/17 0413  NA 142 142 142  K 5.0 4.7 4.9  CL 100 101 102  CO2 28 31 28   GLUCOSE 170* 203* 134*  BUN 18 19 21*  CREATININE 1.23* 1.02* 0.88  CALCIUM 9.4 9.0 8.6*  AST 21  --   --   ALT 22  --   --   ALKPHOS 109  --   --   BILITOT 0.6  --   --    ------------------------------------------------------------------------------------------------------------------ No results for input(s): CHOL, HDL, LDLCALC, TRIG, CHOLHDL, LDLDIRECT in the last 72 hours.  Lab Results  Component Value Date   HGBA1C 6.2 (H) 08/28/2017   ------------------------------------------------------------------------------------------------------------------ No results for input(s): TSH, T4TOTAL, T3FREE, THYROIDAB in the last 72 hours.  Invalid input(s): FREET3 ------------------------------------------------------------------------------------------------------------------ No results for input(s): VITAMINB12, FOLATE, FERRITIN, TIBC, IRON, RETICCTPCT in the last 72 hours.  Coagulation profile No results for input(s): INR, PROTIME in the last 168 hours.  No results for input(s): DDIMER in the last 72 hours.  Cardiac Enzymes No results for input(s): CKMB, TROPONINI, MYOGLOBIN in the last 168 hours.  Invalid input(s):  CK ------------------------------------------------------------------------------------------------------------------    Component Value Date/Time   BNP 120.8 (H) 05/03/2015 1604     Roxan Hockey M.D on 08/29/2017 at 4:41 PM   Go to [email or web address] - password [email or web address] for contact info  [facility] - Office  [phone]

## 2017-08-30 ENCOUNTER — Inpatient Hospital Stay (HOSPITAL_COMMUNITY): Payer: 59 | Admitting: Anesthesiology

## 2017-08-30 ENCOUNTER — Encounter (HOSPITAL_COMMUNITY): Payer: Self-pay | Admitting: Emergency Medicine

## 2017-08-30 ENCOUNTER — Encounter (HOSPITAL_COMMUNITY): Admission: AD | Disposition: A | Discharge status: Home / Self Care | Payer: Self-pay | Source: Home / Self Care

## 2017-08-30 HISTORY — PX: IRRIGATION AND DEBRIDEMENT BUTTOCKS: SHX6601

## 2017-08-30 LAB — VITAMIN D 25 HYDROXY (VIT D DEFICIENCY, FRACTURES): Vit D, 25-Hydroxy: 168 ng/mL — ABNORMAL HIGH (ref 30.0–100.0)

## 2017-08-30 LAB — GLUCOSE, CAPILLARY: Glucose-Capillary: 118 mg/dL — ABNORMAL HIGH (ref 70–99)

## 2017-08-30 SURGERY — IRRIGATION AND DEBRIDEMENT BUTTOCKS
Anesthesia: General | Laterality: Left

## 2017-08-30 MED ORDER — MIDAZOLAM HCL 2 MG/2ML IJ SOLN
INTRAMUSCULAR | Status: AC
  Filled 2017-08-30: qty 2

## 2017-08-30 MED ORDER — HYDROMORPHONE HCL 1 MG/ML IJ SOLN
0.2500 mg | INTRAMUSCULAR | Status: DC | PRN
  Administered 2017-08-30 (×2): 0.5 mg via INTRAVENOUS

## 2017-08-30 MED ORDER — CEFAZOLIN SODIUM-DEXTROSE 2-4 GM/100ML-% IV SOLN
2.0000 g | Freq: Three times a day (TID) | INTRAVENOUS | Status: DC
  Administered 2017-08-30 – 2017-08-31 (×3): 2 g via INTRAVENOUS
  Filled 2017-08-30 (×4): qty 100

## 2017-08-30 MED ORDER — MIDAZOLAM HCL 2 MG/2ML IJ SOLN
INTRAMUSCULAR | Status: DC | PRN
Start: 2017-08-30 — End: 2017-08-30
  Administered 2017-08-30: 2 mg via INTRAVENOUS

## 2017-08-30 MED ORDER — FENTANYL CITRATE (PF) 100 MCG/2ML IJ SOLN
25.0000 ug | INTRAMUSCULAR | Status: DC | PRN
  Administered 2017-08-30 (×2): 50 ug via INTRAVENOUS
  Administered 2017-08-30: 25 ug via INTRAVENOUS

## 2017-08-30 MED ORDER — SUCCINYLCHOLINE CHLORIDE 200 MG/10ML IV SOSY
PREFILLED_SYRINGE | INTRAVENOUS | Status: AC
  Filled 2017-08-30: qty 10

## 2017-08-30 MED ORDER — FENTANYL CITRATE (PF) 100 MCG/2ML IJ SOLN
INTRAMUSCULAR | Status: AC
  Filled 2017-08-30: qty 2

## 2017-08-30 MED ORDER — PROPOFOL 10 MG/ML IV BOLUS
INTRAVENOUS | Status: AC
  Filled 2017-08-30: qty 20

## 2017-08-30 MED ORDER — PROPOFOL 10 MG/ML IV BOLUS
INTRAVENOUS | Status: DC | PRN
  Administered 2017-08-30: 200 mg via INTRAVENOUS

## 2017-08-30 MED ORDER — ONDANSETRON HCL 4 MG/2ML IJ SOLN: 4.0000 mg | Freq: Once | INTRAMUSCULAR | Status: DC | PRN

## 2017-08-30 MED ORDER — LIDOCAINE 2% (20 MG/ML) 5 ML SYRINGE
INTRAMUSCULAR | Status: DC | PRN
  Administered 2017-08-30: 100 mg via INTRAVENOUS

## 2017-08-30 MED ORDER — HYDROMORPHONE HCL 1 MG/ML IJ SOLN
INTRAMUSCULAR | Status: AC
  Filled 2017-08-30: qty 1

## 2017-08-30 MED ORDER — SUCCINYLCHOLINE CHLORIDE 200 MG/10ML IV SOSY
PREFILLED_SYRINGE | INTRAVENOUS | Status: DC | PRN
  Administered 2017-08-30: 140 mg via INTRAVENOUS

## 2017-08-30 MED ORDER — SODIUM CHLORIDE 0.9 % IV SOLN
INTRAVENOUS | Status: DC
  Administered 2017-08-30 – 2017-08-31 (×2): via INTRAVENOUS

## 2017-08-30 MED ORDER — OXYCODONE HCL 5 MG PO TABS
5.0000 mg | ORAL_TABLET | ORAL | Status: DC | PRN
  Administered 2017-08-31: 5 mg via ORAL
  Filled 2017-08-30: qty 1

## 2017-08-30 MED ORDER — MORPHINE SULFATE (PF) 2 MG/ML IV SOLN
2.0000 mg | INTRAVENOUS | Status: DC | PRN
  Administered 2017-08-31: 2 mg via INTRAVENOUS
  Filled 2017-08-30: qty 1

## 2017-08-30 MED ORDER — ONDANSETRON 4 MG PO TBDP: 4.0000 mg | ORAL_TABLET | Freq: Four times a day (QID) | ORAL | Status: DC | PRN

## 2017-08-30 MED ORDER — FENTANYL CITRATE (PF) 100 MCG/2ML IJ SOLN
INTRAMUSCULAR | Status: DC | PRN
  Administered 2017-08-30 (×6): 50 ug via INTRAVENOUS

## 2017-08-30 MED ORDER — SUGAMMADEX SODIUM 200 MG/2ML IV SOLN
INTRAVENOUS | Status: DC | PRN
  Administered 2017-08-30: 350 mg via INTRAVENOUS

## 2017-08-30 MED ORDER — LACTATED RINGERS IV SOLN
INTRAVENOUS | Status: DC
  Administered 2017-08-30 (×2): via INTRAVENOUS

## 2017-08-30 MED ORDER — METRONIDAZOLE 500 MG PO TABS
500.0000 mg | ORAL_TABLET | Freq: Three times a day (TID) | ORAL | Status: DC
  Administered 2017-08-30 – 2017-08-31 (×3): 500 mg via ORAL
  Filled 2017-08-30 (×3): qty 1

## 2017-08-30 MED ORDER — ONDANSETRON HCL 4 MG/2ML IJ SOLN
INTRAMUSCULAR | Status: AC
  Filled 2017-08-30: qty 2

## 2017-08-30 MED ORDER — SUGAMMADEX SODIUM 500 MG/5ML IV SOLN
INTRAVENOUS | Status: AC
  Filled 2017-08-30: qty 5

## 2017-08-30 MED ORDER — ONDANSETRON HCL 4 MG/2ML IJ SOLN
INTRAMUSCULAR | Status: DC | PRN
  Administered 2017-08-30: 4 mg via INTRAVENOUS

## 2017-08-30 MED ORDER — ONDANSETRON HCL 4 MG/2ML IJ SOLN: 4.0000 mg | Freq: Four times a day (QID) | INTRAMUSCULAR | Status: DC | PRN

## 2017-08-30 MED ORDER — ROCURONIUM BROMIDE 10 MG/ML (PF) SYRINGE
PREFILLED_SYRINGE | INTRAVENOUS | Status: DC | PRN
  Administered 2017-08-30: 30 mg via INTRAVENOUS

## 2017-08-30 MED ORDER — LIDOCAINE 2% (20 MG/ML) 5 ML SYRINGE
INTRAMUSCULAR | Status: AC
  Filled 2017-08-30: qty 5

## 2017-08-30 MED ORDER — ENOXAPARIN SODIUM 40 MG/0.4ML ~~LOC~~ SOLN: 40.0000 mg | Freq: Two times a day (BID) | SUBCUTANEOUS | Status: DC

## 2017-08-30 SURGICAL SUPPLY — 20 items
BNDG GAUZE ELAST 4 BULKY (GAUZE/BANDAGES/DRESSINGS) IMPLANT
DRAIN PENROSE .75X.25X12 SILI (WOUND CARE) ×3 IMPLANT
DRAPE LAPAROSCOPIC ABDOMINAL (DRAPES) ×3 IMPLANT
DRAPE SHEET LG 3/4 BI-LAMINATE (DRAPES) ×3 IMPLANT
DRSG PAD ABDOMINAL 8X10 ST (GAUZE/BANDAGES/DRESSINGS) ×3 IMPLANT
ELECT REM PT RETURN 15FT ADLT (MISCELLANEOUS) ×3 IMPLANT
GAUZE PACKING 1 X5 YD ST (GAUZE/BANDAGES/DRESSINGS) IMPLANT
GAUZE PACKING 1/2X5YD (GAUZE/BANDAGES/DRESSINGS) IMPLANT
GAUZE PACKING 2X5 YD STRL (GAUZE/BANDAGES/DRESSINGS) IMPLANT
GAUZE SPONGE 4X4 12PLY STRL (GAUZE/BANDAGES/DRESSINGS) ×3 IMPLANT
GLOVE ECLIPSE 8.0 STRL XLNG CF (GLOVE) ×12 IMPLANT
GLOVE INDICATOR 8.0 STRL GRN (GLOVE) ×3 IMPLANT
GOWN STRL REUS W/TWL XL LVL3 (GOWN DISPOSABLE) ×6 IMPLANT
KIT BASIN OR (CUSTOM PROCEDURE TRAY) ×3 IMPLANT
PACK GENERAL/GYN (CUSTOM PROCEDURE TRAY) ×3 IMPLANT
SUT SILK 0 SH 30 (SUTURE) ×3 IMPLANT
SWAB COLLECTION DEVICE MRSA (MISCELLANEOUS) IMPLANT
SWAB CULTURE ESWAB REG 1ML (MISCELLANEOUS) IMPLANT
TOWEL OR 17X26 10 PK STRL BLUE (TOWEL DISPOSABLE) ×3 IMPLANT
TOWEL OR NON WOVEN STRL DISP B (DISPOSABLE) IMPLANT

## 2017-08-30 NOTE — Progress Notes (Signed)
Patient Demographics:    [name], is a 47 y.o. female, DOB - 1971/01/14, TAV:[id number]  Admit date - 08/27/2017   Admitting Physician Excell Seltzer, MD  Outpatient Primary MD for the patient is Lucretia Kern, DO  LOS - 3   No chief complaint on file.       Subjective:    [name] today has no fevers, no emesis, new complaints, eating and drinking okay, husband at bedside, questions answered  Assessment  & Plan :    Principal Problem:   Left buttock abscess Active Problems:   OSA (obstructive sleep apnea)   Obesity hypoventilation syndrome (HCC)   Cardiomyopathy ([place])   Obesity   Bipolar disorder ([place])   DM2 (diabetes mellitus, type 2) ([place])   1)Left Buttocks and perineal abscess: s/p I&D on 08/27/17, repeat I&D on 08/30/2017 with Penrose drain placement, c/n   IV Zosyn and dressing changes per surgical team , leukocytosis is resolved, no fevers , follow clinical response,   2)T2DM: Last A1c 6.3.    Patient refusing Accu-Cheks and sliding scale coverage, Diabetic coordinator input appreciated3)HFpEF  Pulm HTN: follows with cards as outpatient.  Appears euvolemic.,  Appears compensated continue lasix twice daily,.  4)Bipolar Disorder:   Stable, patient does have anxiety concerns from time to time, continue home lamictal, xanax, klonipin, vraylar.  Follows with Dr. Toy Care as outpatient.  No SI.    5)Hypertension: Stable, continue losartan 25 mg twice daily,, lasix  6. OHS  OSA: nightly CPAP, follow  7) contraception/HRT-- currently for menorrhagia , patient has morbid obesity, sedentary lifestyle, and she takes HRT, high risk for DVT/VT, patient and husband verbalized understanding, SCDs encouraged ,  discussed continuing to discuss this with primary prescriber  Code Status : full code   Disposition Plan  : home   Consults  : Surgery is primary team   DVT  Prophylaxis  :  Lovenox /scd  Lab Results  Component Value Date   PLT 341 08/29/2017    Inpatient Medications  Scheduled Meds: . ALPRAZolam  1 mg Oral TID  . atorvastatin  20 mg Oral QPM  . B-complex with vitamin C  1 tablet Oral Daily  . cariprazine  6 mg Oral QPM  . cholecalciferol  3,000 Units Oral Daily  . clonazePAM  1 mg Oral TID  . Dextromethorphan-quiNIDine  1 tablet Oral Q12H  . [START ON 08/31/2017] enoxaparin (LOVENOX) injection  40 mg Subcutaneous Q12H  . fentaNYL      . fentaNYL      . furosemide  80 mg Oral BID  . HYDROmorphone      . insulin aspart  0-9 Units Subcutaneous TID WC  . lamoTRIgine  200 mg Oral BID  . losartan  25 mg Oral BID  . magnesium oxide  400 mg Oral Daily  . meloxicam  7.5 mg Oral Q12H  . metroNIDAZOLE  500 mg Oral Q8H  . multivitamin with minerals  1 tablet Oral Daily  . norethindrone-ethinyl estradiol-iron  1 tablet Oral QHS  . nutrition supplement (JUVEN)  1 packet Oral BID BM  . potassium chloride SA  20 mEq Oral Daily  . pregabalin  300 mg Oral Daily  . zinc sulfate  220 mg Oral Daily  Continuous Infusions: . sodium chloride    . sodium chloride 100 mL/hr at 08/30/17 1415  .  ceFAZolin (ANCEF) IV 2 g (08/30/17 1604)   PRN Meds:.sodium chloride, albuterol, fluticasone, morphine injection, ondansetron **OR** ondansetron (ZOFRAN) IV, oxyCODONE    Anti-infectives (From admission, onward)   Start     Dose/Rate Route Frequency Ordered Stop   08/30/17 1600  ceFAZolin (ANCEF) IVPB 2g/100 mL premix     2 g 200 mL/hr over 30 Minutes Intravenous Every 8 hours 08/30/17 1415     08/30/17 1600  metroNIDAZOLE (FLAGYL) tablet 500 mg     500 mg Oral Every 8 hours 08/30/17 1415     08/27/17 1850  piperacillin-tazobactam (ZOSYN) IVPB 3.375 g  Status:  Discontinued     3.375 g 12.5 mL/hr over 240 Minutes Intravenous Every 8 hours 08/27/17 1817 08/30/17 1415   08/27/17 1813  piperacillin-tazobactam (ZOSYN) 3.375 GM/50ML IVPB    Note to  Pharmacy:  Enrigue Catena   : cabinet override      08/27/17 1813 08/27/17 1850        Objective:   Vitals:   08/30/17 1350 08/30/17 1446 08/30/17 1544 08/30/17 1750  BP: 137/66 (!) 134/55 123/64 118/66  Pulse: 62 60 (!) 55 62  Resp: 18 18 18 17   Temp: 97.9 F (36.6 C) 98.2 F (36.8 C) 98 F (36.7 C) 98.6 F (37 C)  TempSrc: Oral Oral Oral Oral  SpO2: 93% 91% 99% 100%  Weight:      Height:        Wt Readings from Last 3 Encounters:  08/30/17 (!) 152 kg (335 lb)  08/26/17 (!) 152.2 kg (335 lb 8 oz)  07/24/17 (!) 151.5 kg (334 lb)     Intake/Output Summary (Last 24 hours) at 08/30/2017 1752 Last data filed at 08/30/2017 1629 Gross per 24 hour  Intake 1482.06 ml  Output 2651 ml  Net -1168.94 ml     Physical Exam  Gen:- Awake Alert,  Obese, in no apparent distress  HEENT:- [place].AT, No sclera icterus Neck-Supple Neck,No JVD,.  Lungs-  CTAB , good air movement CV- S1, S2 normal Abd-  +ve B.Sounds, Abd Soft, No tenderness,   increased truncal adiposity Extremity:- No  edema,   negative Homans Psych-affect is appropriate, oriented x3 Neuro-no new focal deficits, no tremors Skin-Lt buttock and perineal wounds with dressing/packing  Data Review:   Micro Results Recent Results (from the past 240 hour(s))  Surgical PCR screen     Status: Abnormal   Collection Time: 08/27/17  6:28 PM  Result Value Ref Range Status   MRSA, PCR NEGATIVE NEGATIVE Final   Staphylococcus aureus POSITIVE (A) NEGATIVE Final    Comment: (NOTE) The Xpert SA Assay (FDA approved for NASAL specimens in patients 83 years of age and older), is one component of a comprehensive surveillance program. It is not intended to diagnose infection nor to guide or monitor treatment. Performed at [facility], [place] [address] [place], [place] [address]   Aerobic/Anaerobic Culture (surgical/deep wound)     Status: None (Preliminary result)   Collection Time: 08/27/17  7:01 PM  Result Value  Ref Range Status   Specimen Description   Final    ABSCESS LEFT BUTTOCKS Performed at [facility] [address] [place], [place] [address]    Special Requests   Final    NONE Performed at [facility], [place] [address], [place], [place] [address]    Gram Stain  Final    ABUNDANT WBC PRESENT, PREDOMINANTLY PMN MODERATE GRAM POSITIVE COCCI Performed at [facility] [address] [place], [place] [address]    Culture   Final    ABUNDANT STAPHYLOCOCCUS AUREUS NO ANAEROBES ISOLATED; CULTURE IN PROGRESS FOR 5 DAYS    Report Status PENDING  Incomplete   Organism ID, Bacteria STAPHYLOCOCCUS AUREUS  Final      Susceptibility   Staphylococcus aureus - MIC*    CIPROFLOXACIN <=0.5 SENSITIVE Sensitive     ERYTHROMYCIN >=8 RESISTANT Resistant     GENTAMICIN <=0.5 SENSITIVE Sensitive     OXACILLIN 0.5 SENSITIVE Sensitive     TETRACYCLINE <=1 SENSITIVE Sensitive     VANCOMYCIN 1 SENSITIVE Sensitive     TRIMETH/SULFA <=10 SENSITIVE Sensitive     CLINDAMYCIN <=0.25 SENSITIVE Sensitive     RIFAMPIN <=0.5 SENSITIVE Sensitive     Inducible Clindamycin NEGATIVE Sensitive     * ABUNDANT STAPHYLOCOCCUS AUREUS    Radiology Reports No results found.   CBC Recent Labs  Lab 08/26/17 1613 08/27/17 1828 08/28/17 0355 08/29/17 0413  WBC 14.8* 17.9* 14.1* 8.8  HGB 13.1 13.9 13.1 13.1  HCT 40.1 41.0 38.9 39.4  PLT 274.0 329 347 341  MCV 96.1 94.7 95.3 94.7  MCH  --  32.1 32.1 31.5  MCHC 32.6 33.9 33.7 33.2  RDW 14.1 13.8 13.7 13.8  LYMPHSABS 1.8 2.1  --   --   MONOABS 0.8 1.3*  --   --   EOSABS 0.2 0.2  --   --   BASOSABS 0.0 0.0  --   --     Chemistries  Recent Labs  Lab 08/27/17 2135 08/28/17 0355 08/29/17 0413  NA 142 142 142  K 5.0 4.7 4.9  CL 100 101 102  CO2 28 31 28   GLUCOSE 170* 203* 134*  BUN 18 19 21*  CREATININE 1.23* 1.02* 0.88  CALCIUM 9.4 9.0 8.6*  AST 21  --   --   ALT 22  --   --   ALKPHOS 109  --   --     BILITOT 0.6  --   --    ------------------------------------------------------------------------------------------------------------------ No results for input(s): CHOL, HDL, LDLCALC, TRIG, CHOLHDL, LDLDIRECT in the last 72 hours.  Lab Results  Component Value Date   HGBA1C 6.2 (H) 08/28/2017   ------------------------------------------------------------------------------------------------------------------ No results for input(s): TSH, T4TOTAL, T3FREE, THYROIDAB in the last 72 hours.  Invalid input(s): FREET3 ------------------------------------------------------------------------------------------------------------------ No results for input(s): VITAMINB12, FOLATE, FERRITIN, TIBC, IRON, RETICCTPCT in the last 72 hours.  Coagulation profile No results for input(s): INR, PROTIME in the last 168 hours.  No results for input(s): DDIMER in the last 72 hours.  Cardiac Enzymes No results for input(s): CKMB, TROPONINI, MYOGLOBIN in the last 168 hours.  Invalid input(s): CK ------------------------------------------------------------------------------------------------------------------    Component Value Date/Time   BNP 120.8 (H) 05/03/2015 1604     Roxan Hockey M.D on 08/30/2017 at 5:52 PM   Go to [email or web address] - password [email or web address] for contact info  [facility] - Office  [phone]

## 2017-08-30 NOTE — Progress Notes (Addendum)
Dr. Roanna Banning notified that pt has not had pregnancy test. Pt told Dr. Roanna Banning that she did not want pregnancy test before surgery. Dr. Roanna Banning okay with plan for no test prior to surgery.

## 2017-08-30 NOTE — Progress Notes (Addendum)
3 Days Post-Op    CC: Buttocks/perineal abscess  Subjective: This is still having fair amount of discomfort with the open sites.  The more caudal site is still has some necrotic tissue and she has some drainage from both sites.  More distal site is cleaning up and looks better.  Objective: Vital signs in last 24 hours: Temp:  [97.3 F (36.3 C)-97.8 F (36.6 C)] 97.8 F (36.6 C) (08/02 0537) Pulse Rate:  [56-68] 56 (08/02 0537) Resp:  [17-18] 17 (08/02 0537) BP: (106-160)/(51-116) 137/116 (08/02 0537) SpO2:  [94 %-100 %] 98 % (08/02 0537) Last BM Date: 08/27/17 715 PO 300 IV BM x 1 Afebrile, VSS Labs OK ;glucose 134-205 range WBC down to normal 8.8 Culture staph - sensitivity is pending   Intake/Output from previous day: 08/01 0701 - 08/02 0700 In: 1005.6 [P.O.:715; I.V.:179.3; IV Piggyback:111.3] Out: 3201 [Urine:3200; Stool:1] Intake/Output this shift: No intake/output data recorded.  General appearance: alert, cooperative and Uncomfortable, on CPAP Skin: To open sites on the left buttocks the upper one has a fair amount of drainage and some fat necrosis.  The lower site is cleaner and starting to look pink.  The cellulitis is much improved.   Lab Results:  Recent Labs    08/28/17 0355 08/29/17 0413  WBC 14.1* 8.8  HGB 13.1 13.1  HCT 38.9 39.4  PLT 347 341    BMET Recent Labs    08/28/17 0355 08/29/17 0413  NA 142 142  K 4.7 4.9  CL 101 102  CO2 31 28  GLUCOSE 203* 134*  BUN 19 21*  CREATININE 1.02* 0.88  CALCIUM 9.0 8.6*   PT/INR No results for input(s): LABPROT, INR in the last 72 hours.  Recent Labs  Lab 08/27/17 2135  AST 21  ALT 22  ALKPHOS 109  BILITOT 0.6  PROT 7.4  ALBUMIN 2.9*     Lipase  No results found for: LIPASE   Medications: . ALPRAZolam  1 mg Oral TID  . atorvastatin  20 mg Oral QPM  . B-complex with vitamin C  1 tablet Oral Daily  . cariprazine  6 mg Oral QPM  . cholecalciferol  3,000 Units Oral Daily  .  clonazePAM  1 mg Oral TID  . Dextromethorphan-quiNIDine  1 tablet Oral Q12H  . enoxaparin (LOVENOX) injection  40 mg Subcutaneous Q12H  . furosemide  80 mg Oral BID  . insulin aspart  0-9 Units Subcutaneous TID WC  . lamoTRIgine  200 mg Oral BID  . losartan  25 mg Oral BID  . magnesium oxide  400 mg Oral Daily  . meloxicam  7.5 mg Oral Q12H  . multivitamin with minerals  1 tablet Oral Daily  . norethindrone-ethinyl estradiol-iron  1 tablet Oral QHS  . nutrition supplement (JUVEN)  1 packet Oral BID BM  . potassium chloride SA  20 mEq Oral Daily  . pregabalin  300 mg Oral Daily  . zinc sulfate  220 mg Oral Daily    Assessment/Plan  Type 2 diabetes Sleep apnea with CPAP Hxof bipolar disease/Hx mania/depression/panic attacks Hypertension Hyperlipidemia BMI 55.7  Complex left buttocks and perineal abscess with early soft tissue necrosis Incision, drainage, and debridement of left buttocks and perineal abscess, 08/27/2017, Dr. Excell Seltzer  POD #3  FEN:IV fluids/carb modified ID: [id number] =>> day 4 DVT: Lovenox Follow-up: Dr. Excell Seltzer  Plan: Patient was seen and examined by Dr. Kieth Brightly.  He plans to take her to the operating room later this afternoon, for exam  under anesthesia with further debridement of the open sites.  This was discussed with the patient and her husband who is at the bedside, in detail.       LOS: 3 days    [name] 08/30/2017 [phone]

## 2017-08-30 NOTE — Transfer of Care (Signed)
Immediate Anesthesia Transfer of Care Note  Patient: [name]  Procedure(s) Performed: IRRIGATION AND DEBRIDEMENT RE-EXCISION OF LEFT SUBCUTANEOUS BUTTOCKS ABCESS (Left )  Patient Location: PACU  Anesthesia Type:General  Level of Consciousness: awake and alert   Airway & Oxygen Therapy: Patient Spontanous Breathing and Patient connected to face mask oxygen  Post-op Assessment: Report given to RN and Post -op Vital signs reviewed and stable  Post vital signs: Reviewed and stable  Last Vitals:  Vitals Value Taken Time  BP 163/76 08/30/2017 11:32 AM  Temp    Pulse 79 08/30/2017 11:34 AM  Resp 18 08/30/2017 11:34 AM  SpO2 91 % 08/30/2017 11:34 AM  Vitals shown include unvalidated device data.  Last Pain:  Vitals:   08/30/17 0930  TempSrc: Oral  PainSc: 5       Patients Stated Pain Goal: 4 (05/39/76 7341)  Complications: No apparent anesthesia complications

## 2017-08-30 NOTE — Anesthesia Preprocedure Evaluation (Addendum)
Anesthesia Evaluation  Patient identified by MRN, date of birth, ID band Patient awake    Reviewed: Allergy & Precautions, NPO status , Patient's Chart, lab work & pertinent test results  Airway Mallampati: II  TM Distance: >3 FB Neck ROM: Full    Dental no notable dental hx.    Pulmonary asthma , sleep apnea and Continuous Positive Airway Pressure Ventilation ,  Obesity hypoventilation syndrome    Pulmonary exam normal breath sounds clear to auscultation       Cardiovascular hypertension, Normal cardiovascular exam Rhythm:Regular Rate:Normal  ECG: NSR, rate 78   Neuro/Psych Seizures -, Well Controlled,  PSYCHIATRIC DISORDERS Anxiety Depression Bipolar Disorder    GI/Hepatic Neg liver ROS, IBS (irritable bowel syndrome)   Endo/Other  Morbid obesitySuper obese  Renal/GU negative Renal ROS     Musculoskeletal negative musculoskeletal ROS (+)   Abdominal (+) + obese,   Peds  Hematology HLD   Anesthesia Other Findings braclet to left wrist  Reproductive/Obstetrics Pregnancy test offered to and declined by patient                            Anesthesia Physical Anesthesia Plan  ASA: IV  Anesthesia Plan: General   Post-op Pain Management:    Induction: Intravenous  PONV Risk Score and Plan: 3 and Ondansetron, Dexamethasone, Midazolam and Treatment may vary due to age or medical condition  Airway Management Planned: Oral ETT and Video Laryngoscope Planned  Additional Equipment:   Intra-op Plan:   Post-operative Plan: Extubation in OR  Informed Consent: I have reviewed the patients History and Physical, chart, labs and discussed the procedure including the risks, benefits and alternatives for the proposed anesthesia with the patient or authorized representative who has indicated his/her understanding and acceptance.   Dental advisory given  Plan Discussed with: CRNA  Anesthesia Plan  Comments:         Anesthesia Quick Evaluation

## 2017-08-30 NOTE — Addendum Note (Signed)
Addendum  created 08/30/17 1605 by Sharlette Dense, CRNA   Charge Capture section accepted

## 2017-08-30 NOTE — Anesthesia Procedure Notes (Signed)
Procedure Name: Intubation Date/Time: 08/30/2017 10:36 AM Performed by: Sharlette Dense, CRNA Patient Re-evaluated:Patient Re-evaluated prior to induction Oxygen Delivery Method: Circle system utilized Preoxygenation: Pre-oxygenation with 100% oxygen Induction Type: IV induction Ventilation: Mask ventilation without difficulty Laryngoscope Size: Glidescope and 4 Grade View: Grade I Tube type: Oral Tube size: 7.5 mm Number of attempts: 1 Airway Equipment and Method: Video-laryngoscopy Placement Confirmation: ETT inserted through vocal cords under direct vision,  positive ETCO2 and breath sounds checked- equal and bilateral Secured at: 22 cm Tube secured with: Tape Dental Injury: Teeth and Oropharynx as per pre-operative assessment  Difficulty Due To: Difficulty was anticipated

## 2017-08-30 NOTE — Op Note (Signed)
Preoperative diagnosis: Perirectal abscess  Postoperative diagnosis: same   Procedure: exam under anesthesia, debridement of subcutaneous issue 3cm^2, placement of drain  Surgeon: Gurney Maxin, M.D.  Asst: none  Anesthesia: General anesthesia  Indications for procedure: [name] is a 47 y.o. year old female with symptoms of abscess. She had a drainage procedure earlier in the week but on wound check she continued to have fibrinous tissue so decision was made .  Description of procedure: The patient was brought into the operative suite. Anesthesia was administered with general anesthesia. WHO checklist was applied. The patient was then placed in lithotomy position. The area was prepped and draped in the usual sterile fashion.  The area was inspected. In the posterior aspect of the posterior wound there was a moderate amount of necrotic fat. This was removed sharply with curette and then with cautery. Blunt dissection was used to remove some other small fibrinous tissue of the remainder of the wound. The anterior wound looked well. Next, a 3/5" penrose drain was passed between the 2 wounds and the penrose was sutured into a ring with 0 silk x3. Next, 370ml of irrigation was used to wash out the wound. Hemostasis was intact. The wound was packed with salinated kerlex gauze. The patient tolerated the procedure well and was tranferred to pacu in stable condition.  Findings: fibrinous/necrotic tissue of the posterior area of the wound  Specimen: none  Implant: 3/4" penrose drain   Blood loss: <36ml  Local anesthesia: none  Complications: none  Gurney Maxin, M.D. General, Bariatric, & Minimally Invasive Surgery [facility]

## 2017-08-30 NOTE — Anesthesia Postprocedure Evaluation (Signed)
Anesthesia Post Note  Patient: [name]  Procedure(s) Performed: IRRIGATION AND DEBRIDEMENT RE-EXCISION OF LEFT SUBCUTANEOUS BUTTOCKS ABCESS (Left )     Patient location during evaluation: PACU Anesthesia Type: General Level of consciousness: awake and alert Pain management: pain level controlled Vital Signs Assessment: post-procedure vital signs reviewed and stable Respiratory status: spontaneous breathing, nonlabored ventilation, respiratory function stable and patient connected to nasal cannula oxygen Cardiovascular status: blood pressure returned to baseline and stable Postop Assessment: no apparent nausea or vomiting Anesthetic complications: no    Last Vitals:  Vitals:   08/30/17 1244 08/30/17 1350  BP: (!) 143/60 137/66  Pulse: 69 62  Resp: 16 18  Temp: 37.1 C 36.6 C  SpO2: 96% 93%    Last Pain:  Vitals:   08/30/17 1350  TempSrc: Oral  PainSc:                  Ryan P Ellender

## 2017-08-31 ENCOUNTER — Encounter (HOSPITAL_COMMUNITY): Payer: Self-pay | Admitting: General Surgery

## 2017-08-31 LAB — CBC
HCT: 39.5 % (ref 36.0–46.0)
Hemoglobin: 12.7 g/dL (ref 12.0–15.0)
MCH: 31.4 pg (ref 26.0–34.0)
MCHC: 32.2 g/dL (ref 30.0–36.0)
MCV: 97.5 fL (ref 78.0–100.0)
Platelets: 382 10*3/uL (ref 150–400)
RBC: 4.05 MIL/uL (ref 3.87–5.11)
RDW: 13.6 % (ref 11.5–15.5)
WBC: 9.8 10*3/uL (ref 4.0–10.5)

## 2017-08-31 LAB — BASIC METABOLIC PANEL
Anion gap: 11 (ref 5–15)
BUN: 14 mg/dL (ref 6–20)
CO2: 27 mmol/L (ref 22–32)
Calcium: 8.7 mg/dL — ABNORMAL LOW (ref 8.9–10.3)
Chloride: 104 mmol/L (ref 98–111)
Creatinine, Ser: 0.9 mg/dL (ref 0.44–1.00)
GFR calc Af Amer: >60 mL/min (ref >60)
GFR calc non Af Amer: >60 mL/min (ref >60)
GLUCOSE: 126 mg/dL — AB (ref 70–99)
Potassium: 4.4 mmol/L (ref 3.5–5.1)
Sodium: 142 mmol/L (ref 135–145)

## 2017-08-31 MED ORDER — AMOXICILLIN-POT CLAVULANATE 875-125 MG PO TABS: 1.0000 | ORAL_TABLET | Freq: Two times a day (BID) | ORAL | 0 refills | Status: DC

## 2017-08-31 MED ORDER — DEXTROMETHORPHAN-QUINIDINE 20-10 MG PO CAPS
1.0000 | ORAL_CAPSULE | Freq: Two times a day (BID) | ORAL | Status: DC
  Administered 2017-08-31: 1 via ORAL
  Filled 2017-08-31: qty 1

## 2017-08-31 MED ORDER — AMOXICILLIN-POT CLAVULANATE 875-125 MG PO TABS
1.0000 | ORAL_TABLET | Freq: Two times a day (BID) | ORAL | Status: DC
  Administered 2017-08-31: 1 via ORAL
  Filled 2017-08-31: qty 1

## 2017-08-31 MED ORDER — OXYCODONE HCL 5 MG PO TABS: 5.0000 mg | ORAL_TABLET | Freq: Four times a day (QID) | ORAL | 0 refills | Status: DC | PRN

## 2017-08-31 NOTE — Discharge Summary (Addendum)
Physician Discharge Summary  Patient ID: [name] MRN: [id number] DOB/AGE: [birth year] 48 y.o.  Admit date: 08/27/2017 Discharge date: 08/31/2017  Admission Diagnoses: Rectal abscess Discharge Diagnoses:  Principal Problem:   Left buttock abscess Active Problems:   OSA (obstructive sleep apnea)   Obesity hypoventilation syndrome (HCC)   Cardiomyopathy ([place])   Obesity   Bipolar disorder ([place])   DM2 (diabetes mellitus, type 2) ([place])   Discharged Condition: good  Hospital Course: [name] is a 47 y.o. year old female with symptoms of abscess. She had a drainage procedure earlier in the week but on wound check she continued to have fibrinous tissue so decision was made to have her admitted and repeat operative debridement was performed on 08/30/17.She was discharged home once pain was controlled and she was tolerating her wound care.    Consults: None  Significant Diagnostic Studies: labs: cbc  Treatments: surgery: I&D  Discharge Exam: Blood pressure 118/72, pulse 60, temperature 97.7 F (36.5 C), temperature source Oral, resp. rate 18, height 5\' 5"  (1.651 m), weight (!) 152 kg (335 lb), SpO2 96 %. General appearance: alert and cooperative Skin: wound open and draining, penrose in place, packing removed  Disposition: Discharge disposition: 01-Home or Self Care     I have reviewed the [place] for this patient.      Follow-up Information    Excell Seltzer, MD. Schedule an appointment as soon as possible for a visit in 3 week(s).   Specialty:  General Surgery Contact information: [place] [address] [phone]           Signed: Rosario Adie 02/07/3788, 2:40 AM

## 2017-08-31 NOTE — Progress Notes (Signed)
Assessment unchanged. Pt tolerated sitz bath and understands to continue post dc at home 3 times a day & PRN. Scripts given. Pt and husband verbalized understanding of dc instructions through teach back including sitz baths, when to call the doctor, follow up care, and medications to resume. Supplies for home provided. Discharged via wc to front entrance accompanied by NT and husband.

## 2017-08-31 NOTE — Progress Notes (Signed)
Sitz bath completed. Pt tolerated well. Education given for home.

## 2017-08-31 NOTE — Progress Notes (Addendum)
Patient Demographics:    [name], is a 47 y.o. female, DOB - 05/30/1970, GBT:[id number]  Admit date - 08/27/2017   Admitting Physician Excell Seltzer, MD  Outpatient Primary MD for the patient is Lucretia Kern, DO  LOS - 4   No chief complaint on file.       Subjective:    [name] today has no fevers, ambulating around without significant dizziness dyspnea on exertion or palpitations, had loose stools not foul-smelling no blood  Assessment  & Plan :    Principal Problem:   Left buttock abscess Active Problems:   OSA (obstructive sleep apnea)   Obesity hypoventilation syndrome (HCC)   Cardiomyopathy (HCC)   Obesity   Bipolar disorder ([place])   DM2 (diabetes mellitus, type 2) ([place])    1)Left Buttocks and perineal abscess: s/p I&D on 08/27/17, repeat I&D on 08/30/2017 with Pen-rose drain placement, treated with IV Zosyn, being discharged on Augmentin by surgical team, further dressing changes per surgical team , leukocytosis is resolved, no fevers , apparently surgical attending Dr. Leighton Ruff has advised patient to do sitz baths rather than pack the wound  2)T2DM: Last A1c 6.3.    Restart metformin upon discharge home, patient refusing Accu-Cheks and sliding scale coverage, Diabetic coordinator input appreciated  3)HFpEF  Pulm HTN: follows with cards as outpatient.  Appears euvolemic.,  Appears compensated continue lasix twice daily,.  4)Bipolar Disorder:   Stable, patient does have anxiety concerns from time to time, continue home lamictal, xanax, klonipin, vraylar.  Follows with Dr. Toy Care as outpatient.  No SI.    5)Hypertension: Stable, continue losartan 25 mg twice daily,, lasix  6. OHS  OSA: nightly CPAP, follow  7) contraception/HRT-- currently for menorrhagia , patient has morbid obesity, sedentary lifestyle, and she takes HRT, high risk for DVT/VT, patient and  husband verbalized understanding, SCDs encouraged ,  discussed continuing to discuss this with primary prescriber  Code Status : full code  Disposition Plan  : home   Consults  : Surgery is primary team   DVT Prophylaxis  :  Lovenox /scd  Lab Results  Component Value Date   PLT 382 08/31/2017    Inpatient Medications  Scheduled Meds: . ALPRAZolam  1 mg Oral TID  . amoxicillin-clavulanate  1 tablet Oral Q12H  . atorvastatin  20 mg Oral QPM  . B-complex with vitamin C  1 tablet Oral Daily  . cariprazine  6 mg Oral QPM  . cholecalciferol  3,000 Units Oral Daily  . clonazePAM  1 mg Oral TID  . Dextromethorphan-quiNIDine  1 capsule Oral Q12H  . enoxaparin (LOVENOX) injection  40 mg Subcutaneous Q12H  . furosemide  80 mg Oral BID  . insulin aspart  0-9 Units Subcutaneous TID WC  . lamoTRIgine  200 mg Oral BID  . losartan  25 mg Oral BID  . magnesium oxide  400 mg Oral Daily  . meloxicam  7.5 mg Oral Q12H  . multivitamin with minerals  1 tablet Oral Daily  . norethindrone-ethinyl estradiol-iron  1 tablet Oral QHS  . nutrition supplement (JUVEN)  1 packet Oral BID BM  . potassium chloride SA  20 mEq Oral Daily  . pregabalin  300 mg Oral Daily  .  zinc sulfate  220 mg Oral Daily   Continuous Infusions:  PRN Meds:.albuterol, fluticasone, morphine injection, ondansetron **OR** ondansetron (ZOFRAN) IV, oxyCODONE    Anti-infectives (From admission, onward)   Start     Dose/Rate Route Frequency Ordered Stop   08/31/17 1000  amoxicillin-clavulanate (AUGMENTIN) 875-125 MG per tablet 1 tablet     1 tablet Oral Every 12 hours 08/31/17 0801 09/07/17 0959   08/31/17 0000  amoxicillin-clavulanate (AUGMENTIN) 875-125 MG tablet  Status:  Discontinued     1 tablet Oral Every 12 hours 08/31/17 0813 08/31/17    08/31/17 0000  amoxicillin-clavulanate (AUGMENTIN) 875-125 MG tablet     1 tablet Oral Every 12 hours 08/31/17 0852     08/30/17 1600  ceFAZolin (ANCEF) IVPB 2g/100 mL premix   Status:  Discontinued     2 g 200 mL/hr over 30 Minutes Intravenous Every 8 hours 08/30/17 1415 08/31/17 0801   08/30/17 1600  metroNIDAZOLE (FLAGYL) tablet 500 mg  Status:  Discontinued     500 mg Oral Every 8 hours 08/30/17 1415 08/31/17 0801   08/27/17 1850  piperacillin-tazobactam (ZOSYN) IVPB 3.375 g  Status:  Discontinued     3.375 g 12.5 mL/hr over 240 Minutes Intravenous Every 8 hours 08/27/17 1817 08/30/17 1415   08/27/17 1813  piperacillin-tazobactam (ZOSYN) 3.375 GM/50ML IVPB    Note to Pharmacy:  Enrigue Catena   : cabinet override      08/27/17 1813 08/27/17 1850        Objective:   Vitals:   08/30/17 1750 08/30/17 2111 08/31/17 0137 08/31/17 0410  BP: 118/66 133/67 (!) 128/45 118/72  Pulse: 62 65 (!) 58 60  Resp: 17 18  18   Temp: 98.6 F (37 C) (!) 97.5 F (36.4 C) (!) 97.5 F (36.4 C) 97.7 F (36.5 C)  TempSrc: Oral Oral Oral Oral  SpO2: 100% 99% 97% 96%  Weight:      Height:        Wt Readings from Last 3 Encounters:  08/30/17 (!) 152 kg (335 lb)  08/26/17 (!) 152.2 kg (335 lb 8 oz)  07/24/17 (!) 151.5 kg (334 lb)     Intake/Output Summary (Last 24 hours) at 08/31/2017 1235 Last data filed at 08/31/2017 0600 Gross per 24 hour  Intake 1652.66 ml  Output 1800 ml  Net -147.34 ml     Physical Exam  Gen:- Awake Alert,  Obese, in no apparent distress  HEENT:- .AT, No sclera icterus Neck-Supple Neck,No JVD,.  Lungs-  CTAB , good air movement CV- S1, S2 normal Abd-  +ve B.Sounds, Abd Soft, No tenderness,   increased truncal adiposity Extremity:- No  edema,   negative Homans Psych-affect is appropriate, oriented x3 Neuro-no new focal deficits, no tremors Skin-Lt buttock and perineal wounds with Pen-Rose drain----mostly open wounds with scant yellowish drainage, and some fibrinous tissue the upper end of the left buttock wound  Data Review:   Micro Results Recent Results (from the past 240 hour(s))  Surgical PCR screen     Status: Abnormal    Collection Time: 08/27/17  6:28 PM  Result Value Ref Range Status   MRSA, PCR NEGATIVE NEGATIVE Final   Staphylococcus aureus POSITIVE (A) NEGATIVE Final    Comment: (NOTE) The Xpert SA Assay (FDA approved for NASAL specimens in patients 83 years of age and older), is one component of a comprehensive surveillance program. It is not intended to diagnose infection nor to guide or monitor treatment. Performed at [facility], [address] [place], [place] [address]   Aerobic/Anaerobic Culture (surgical/deep wound)     Status: None (Preliminary result)   Collection Time: 08/27/17  7:01 PM  Result Value Ref Range Status   Specimen Description   Final    ABSCESS LEFT BUTTOCKS Performed at [facility] [address] [place], [place] [address]    Special Requests   Final    NONE Performed at [facility], [place] [address], [place], [place] [address]    Gram Stain   Final    ABUNDANT WBC PRESENT, PREDOMINANTLY PMN MODERATE GRAM POSITIVE COCCI Performed at [facility], [place] [address], [place], [place] [address]    Culture   Final    ABUNDANT STAPHYLOCOCCUS AUREUS NO ANAEROBES ISOLATED; CULTURE IN PROGRESS FOR 5 DAYS    Report Status PENDING  Incomplete   Organism ID, Bacteria STAPHYLOCOCCUS AUREUS  Final      Susceptibility   Staphylococcus aureus - MIC*    CIPROFLOXACIN <=0.5 SENSITIVE Sensitive     ERYTHROMYCIN >=8 RESISTANT Resistant     GENTAMICIN <=0.5 SENSITIVE Sensitive     OXACILLIN 0.5 SENSITIVE Sensitive     TETRACYCLINE <=1 SENSITIVE Sensitive     VANCOMYCIN 1 SENSITIVE Sensitive     TRIMETH/SULFA <=10 SENSITIVE Sensitive     CLINDAMYCIN <=0.25 SENSITIVE Sensitive     RIFAMPIN <=0.5 SENSITIVE Sensitive     Inducible Clindamycin NEGATIVE Sensitive     * ABUNDANT STAPHYLOCOCCUS AUREUS    Radiology Reports No results found.   CBC Recent Labs  Lab 08/26/17 1613 08/27/17 1828 08/28/17 0355  08/29/17 0413 08/31/17 0414  WBC 14.8* 17.9* 14.1* 8.8 9.8  HGB 13.1 13.9 13.1 13.1 12.7  HCT 40.1 41.0 38.9 39.4 39.5  PLT 274.0 329 347 341 382  MCV 96.1 94.7 95.3 94.7 97.5  MCH  --  32.1 32.1 31.5 31.4  MCHC 32.6 33.9 33.7 33.2 32.2  RDW 14.1 13.8 13.7 13.8 13.6  LYMPHSABS 1.8 2.1  --   --   --   MONOABS 0.8 1.3*  --   --   --   EOSABS 0.2 0.2  --   --   --   BASOSABS 0.0 0.0  --   --   --     Chemistries  Recent Labs  Lab 08/27/17 2135 08/28/17 0355 08/29/17 0413 08/31/17 0414  NA 142 142 142 142  K 5.0 4.7 4.9 4.4  CL 100 101 102 104  CO2 28 31 28 27   GLUCOSE 170* 203* 134* 126*  BUN 18 19 21* 14  CREATININE 1.23* 1.02* 0.88 0.90  CALCIUM 9.4 9.0 8.6* 8.7*  AST 21  --   --   --   ALT 22  --   --   --   ALKPHOS 109  --   --   --   BILITOT 0.6  --   --   --    ------------------------------------------------------------------------------------------------------------------ No results for input(s): CHOL, HDL, LDLCALC, TRIG, CHOLHDL, LDLDIRECT in the last 72 hours.  Lab Results  Component Value Date   HGBA1C 6.2 (H) 08/28/2017   ------------------------------------------------------------------------------------------------------------------ No results for input(s): TSH, T4TOTAL, T3FREE, THYROIDAB in the last 72 hours.  Invalid input(s): FREET3 ------------------------------------------------------------------------------------------------------------------ No results for input(s): VITAMINB12, FOLATE, FERRITIN, TIBC, IRON, RETICCTPCT in the last 72 hours.  Coagulation profile No results for input(s): INR, PROTIME in the last 168 hours.  No results for input(s): DDIMER in the last 72 hours.  Cardiac Enzymes  No results for input(s): CKMB, TROPONINI, MYOGLOBIN in the last 168 hours.  Invalid input(s): CK ------------------------------------------------------------------------------------------------------------------    Component Value Date/Time   BNP  120.8 (H) 05/03/2015 1604    Roxan Hockey M.D on 08/31/2017 at 12:35 PM   Go to [email or web address] - password [email or web address] for contact info  [facility] - Office  [phone]

## 2017-09-02 LAB — AEROBIC/ANAEROBIC CULTURE W GRAM STAIN (SURGICAL/DEEP WOUND)

## 2017-09-02 LAB — AEROBIC/ANAEROBIC CULTURE (SURGICAL/DEEP WOUND)

## 2017-09-03 ENCOUNTER — Ambulatory Visit: Payer: 59 | Admitting: Psychology

## 2017-09-04 ENCOUNTER — Telehealth: Payer: Self-pay | Admitting: Family Medicine

## 2017-09-04 NOTE — Telephone Encounter (Signed)
Message sent to Dr. Jordan for review and approval. 

## 2017-09-04 NOTE — Telephone Encounter (Signed)
Copied from [place] [phone]. Topic: Quick Communication - Rx Refill/Question >> Sep 04, 2017  9:53 AM Scherrie Gerlach wrote:  Medication: diflucan  After getting abx from Dr Martinique 8/03, pt now has a very bad yeast infection.  Requesting this med [facility], [place] [facility] [place] [phone] (Phone) [phone] (Fax)

## 2017-09-04 NOTE — Telephone Encounter (Signed)
If requesting diflucan ok to send 2 pills. Take one today and 1 in 3 days if symptoms persist. Looks like not sent to Dr. Martinique.

## 2017-09-06 ENCOUNTER — Other Ambulatory Visit: Payer: Self-pay | Admitting: *Deleted

## 2017-09-06 MED ORDER — FLUCONAZOLE 150 MG PO TABS: 150.0000 mg | ORAL_TABLET | Freq: Once | ORAL | 0 refills | Status: AC

## 2017-09-06 NOTE — Telephone Encounter (Signed)
Spoke with patient and informed her that Rx was sent to the pharmacy as requested.

## 2017-09-10 ENCOUNTER — Ambulatory Visit: Payer: PRIVATE HEALTH INSURANCE | Admitting: Psychology

## 2017-09-17 ENCOUNTER — Ambulatory Visit: Payer: PRIVATE HEALTH INSURANCE | Admitting: Psychology

## 2017-09-22 ENCOUNTER — Other Ambulatory Visit: Payer: Self-pay | Admitting: Cardiology

## 2017-09-24 ENCOUNTER — Ambulatory Visit: Payer: PRIVATE HEALTH INSURANCE | Admitting: Psychology

## 2017-10-01 ENCOUNTER — Ambulatory Visit (INDEPENDENT_AMBULATORY_CARE_PROVIDER_SITE_OTHER): Payer: PRIVATE HEALTH INSURANCE | Admitting: Psychology

## 2017-10-01 DIAGNOSIS — F319 Bipolar disorder, unspecified: Secondary | ICD-10-CM

## 2017-10-08 ENCOUNTER — Ambulatory Visit (INDEPENDENT_AMBULATORY_CARE_PROVIDER_SITE_OTHER): Payer: PRIVATE HEALTH INSURANCE | Admitting: Psychology

## 2017-10-08 DIAGNOSIS — F319 Bipolar disorder, unspecified: Secondary | ICD-10-CM

## 2017-10-15 ENCOUNTER — Ambulatory Visit (INDEPENDENT_AMBULATORY_CARE_PROVIDER_SITE_OTHER): Payer: PRIVATE HEALTH INSURANCE | Admitting: Psychology

## 2017-10-15 DIAGNOSIS — F319 Bipolar disorder, unspecified: Secondary | ICD-10-CM

## 2017-10-22 ENCOUNTER — Ambulatory Visit (INDEPENDENT_AMBULATORY_CARE_PROVIDER_SITE_OTHER): Payer: PRIVATE HEALTH INSURANCE | Admitting: Psychology

## 2017-10-22 DIAGNOSIS — F319 Bipolar disorder, unspecified: Secondary | ICD-10-CM | POA: Diagnosis not present

## 2017-10-29 ENCOUNTER — Ambulatory Visit: Payer: PRIVATE HEALTH INSURANCE | Admitting: Psychology

## 2017-11-05 ENCOUNTER — Ambulatory Visit (INDEPENDENT_AMBULATORY_CARE_PROVIDER_SITE_OTHER): Payer: PRIVATE HEALTH INSURANCE | Admitting: Psychology

## 2017-11-05 DIAGNOSIS — F319 Bipolar disorder, unspecified: Secondary | ICD-10-CM

## 2017-11-12 ENCOUNTER — Ambulatory Visit (INDEPENDENT_AMBULATORY_CARE_PROVIDER_SITE_OTHER): Payer: PRIVATE HEALTH INSURANCE | Admitting: Psychology

## 2017-11-12 DIAGNOSIS — F319 Bipolar disorder, unspecified: Secondary | ICD-10-CM | POA: Diagnosis not present

## 2017-11-19 ENCOUNTER — Ambulatory Visit (INDEPENDENT_AMBULATORY_CARE_PROVIDER_SITE_OTHER): Payer: PRIVATE HEALTH INSURANCE | Admitting: Psychology

## 2017-11-19 DIAGNOSIS — F319 Bipolar disorder, unspecified: Secondary | ICD-10-CM

## 2017-11-26 ENCOUNTER — Ambulatory Visit (INDEPENDENT_AMBULATORY_CARE_PROVIDER_SITE_OTHER): Payer: PRIVATE HEALTH INSURANCE | Admitting: Psychology

## 2017-11-26 DIAGNOSIS — F319 Bipolar disorder, unspecified: Secondary | ICD-10-CM

## 2017-12-08 ENCOUNTER — Other Ambulatory Visit: Payer: Self-pay | Admitting: Cardiology

## 2017-12-24 ENCOUNTER — Ambulatory Visit (INDEPENDENT_AMBULATORY_CARE_PROVIDER_SITE_OTHER): Payer: PRIVATE HEALTH INSURANCE | Admitting: Psychology

## 2017-12-24 ENCOUNTER — Other Ambulatory Visit: Payer: Self-pay | Admitting: Cardiology

## 2017-12-24 DIAGNOSIS — F331 Major depressive disorder, recurrent, moderate: Secondary | ICD-10-CM

## 2017-12-24 NOTE — Telephone Encounter (Signed)
Rx request sent to pharmacy.  

## 2017-12-31 ENCOUNTER — Ambulatory Visit (INDEPENDENT_AMBULATORY_CARE_PROVIDER_SITE_OTHER): Payer: PRIVATE HEALTH INSURANCE | Admitting: Psychology

## 2017-12-31 DIAGNOSIS — F319 Bipolar disorder, unspecified: Secondary | ICD-10-CM

## 2018-01-07 ENCOUNTER — Ambulatory Visit: Payer: PRIVATE HEALTH INSURANCE | Admitting: Psychology

## 2018-01-14 ENCOUNTER — Ambulatory Visit: Payer: PRIVATE HEALTH INSURANCE | Admitting: Psychology

## 2018-02-04 ENCOUNTER — Ambulatory Visit (INDEPENDENT_AMBULATORY_CARE_PROVIDER_SITE_OTHER): Payer: BLUE CROSS/BLUE SHIELD | Admitting: Psychology

## 2018-02-04 DIAGNOSIS — F319 Bipolar disorder, unspecified: Secondary | ICD-10-CM | POA: Diagnosis not present

## 2018-02-11 ENCOUNTER — Ambulatory Visit (INDEPENDENT_AMBULATORY_CARE_PROVIDER_SITE_OTHER): Payer: BLUE CROSS/BLUE SHIELD | Admitting: Psychology

## 2018-02-11 DIAGNOSIS — F319 Bipolar disorder, unspecified: Secondary | ICD-10-CM

## 2018-02-18 ENCOUNTER — Ambulatory Visit (INDEPENDENT_AMBULATORY_CARE_PROVIDER_SITE_OTHER): Payer: BLUE CROSS/BLUE SHIELD | Admitting: Psychology

## 2018-02-18 DIAGNOSIS — F319 Bipolar disorder, unspecified: Secondary | ICD-10-CM | POA: Diagnosis not present

## 2018-02-25 ENCOUNTER — Ambulatory Visit (INDEPENDENT_AMBULATORY_CARE_PROVIDER_SITE_OTHER): Payer: BLUE CROSS/BLUE SHIELD | Admitting: Psychology

## 2018-02-25 DIAGNOSIS — F319 Bipolar disorder, unspecified: Secondary | ICD-10-CM

## 2018-03-02 ENCOUNTER — Encounter: Payer: Self-pay | Admitting: Adult Health

## 2018-03-03 ENCOUNTER — Encounter: Payer: Self-pay | Admitting: Adult Health

## 2018-03-03 ENCOUNTER — Ambulatory Visit (INDEPENDENT_AMBULATORY_CARE_PROVIDER_SITE_OTHER): Payer: BLUE CROSS/BLUE SHIELD | Admitting: Adult Health

## 2018-03-03 VITALS — BP 126/82 | HR 67 | Ht 65.0 in | Wt 338.0 lb

## 2018-03-03 DIAGNOSIS — E662 Morbid (severe) obesity with alveolar hypoventilation: Secondary | ICD-10-CM | POA: Diagnosis not present

## 2018-03-03 DIAGNOSIS — G4733 Obstructive sleep apnea (adult) (pediatric): Secondary | ICD-10-CM

## 2018-03-03 DIAGNOSIS — E669 Obesity, unspecified: Secondary | ICD-10-CM

## 2018-03-03 NOTE — Progress Notes (Signed)
@Patient  ID: [name], female    DOB: 1970/03/20, 48 y.o.   MRN: [id number]  Chief Complaint  Patient presents with  . Follow-up    OSA     Referring provider: Lucretia Kern, DO  HPI: 48 year old female never smoker followed for obstructive sleep apnea, OHS, mild intermittent asthma Medical history significant for diabetes and bipolar disorder  TEST/EVENTS :  04/2010 ABG was 7.40/49/63 on 4 L [place]. [name]   PSG  2002 >> severe OSA with AHI 40/h, lowest desatn to 52% >> 8 cm H2O - wt 325 lbs   CPAP titration study >>required 10-16cm CPAP, higher pressure when supine/ REM sleep.    PFTs 06/2011 - no obstruction, mild restriction, FVC 78%, DLCO 64%  03/03/2018 Follow up : OSA  Patient presents for a one-year follow-up.  Patient has underlying severe sleep apnea.  She is on CPAP at bedtime.  Says she is doing very well on her machine.  She feels rested with no significant daytime sleepiness.  Download shows excellent compliance with 100% usage.  Average daily usage at 10.5 hours.  AHI 1.2.  Patient is on CPAP 15 cm H2O.   Allergies  Allergen Reactions  . Savella [Milnacipran Hcl]     mania    Immunization History  Administered Date(s) Administered  . Tdap 03/18/2014    Past Medical History:  Diagnosis Date  . Acute systolic congestive heart failure ([place])   . Agoraphobia   . Anxiety    panic attacks  . Arthritis    osteoarthritis bilal. knees  . Asthma    no per PFT 7/13; reports does not have asthma  . Bipolar disorder ([place])   . Carpal tunnel syndrome of left wrist 05/2011   Being evaluated for MS  . Cor pulmonale ([place])   . Depression   . History of pleural effusion   . Hyperlipidemia   . Hypertension   . Hypoxemia    history of - no home O2  . IBS (irritable bowel syndrome)   . Morbid obesity ([place])   . Obesity hypoventilation syndrome ([place])   . Prediabetes   . Seasonal allergies    current runny nose  . Seizures ([place])    febrile seizure x 1 as a  child. Several times  . Sleep apnea sleep study 07/02/2010   uses CPAP nightly  . Spinal stenosis in cervical region   . Urinary incontinence     Tobacco History: Social History   Tobacco Use  Smoking Status Never Smoker  Smokeless Tobacco Never Used   Counseling given: Not Answered   Outpatient Medications Prior to Visit  Medication Sig Dispense Refill  . acetaminophen (TYLENOL) 500 MG tablet Take 2,000 mg by mouth 2 (two) times daily. Taking every day per patient    . albuterol (PROVENTIL HFA;VENTOLIN HFA) 108 (90 Base) MCG/ACT inhaler Inhale 2 puffs into the lungs every 6 (six) hours as needed for wheezing or shortness of breath. 1 Inhaler 3  . ALPRAZolam (XANAX) 1 MG tablet Take 1 mg by mouth 3 (three) times daily.     . Ascorbic Acid (VITAMIN C) 500 MG tablet Take 1,000 mg by mouth daily.     [name] aspirin 81 MG tablet Take 81 mg by mouth daily.     [name] atorvastatin (LIPITOR) 20 MG tablet Take 1 tablet (20 mg total) by mouth every evening. 90 tablet 3  . atorvastatin (LIPITOR) 20 MG tablet TAKE 1 TABLET BY MOUTH EVERY DAY IN THE  EVENING 90 tablet 3  . b complex vitamins tablet Take 1 tablet by mouth daily.    . Cholecalciferol (VITAMIN D3) 5000 UNITS TABS Take 4 tablets by mouth daily.     . Chromium Picolinate 800 MCG TABS Take 800 mcg by mouth daily.    . clonazePAM (KLONOPIN) 1 MG tablet Take 1 tablet (1 mg total) by mouth 3 (three) times daily. 30 tablet 0  . fluticasone (FLONASE) 50 MCG/ACT nasal spray Place 1 spray into both nostrils daily. 16 g 6  . furosemide (LASIX) 40 MG tablet Take 2 tablets (80 mg total) by mouth 2 (two) times daily. 360 tablet 3  . lamoTRIgine (LAMICTAL) 200 MG tablet Take 200 mg by mouth 2 (two) times daily.     [name] lidocaine (LIDODERM) 5 % Place 1 patch onto the skin daily as needed (for knee pain). Remove & Discard patch within 12 hours or as directed by MD 30 patch 0  . losartan (COZAAR) 25 MG tablet TAKE 1 TABLET TWICE A DAY 180 tablet 3  . LYRICA 300  MG capsule Take 1 tablet by mouth Twice daily.    [name] MAGNESIUM PO Take 1 tablet by mouth daily.    . Meloxicam (MOBIC PO) Take 7.5 mg by mouth every 12 (twelve) hours.     . metFORMIN (GLUCOPHAGE) 500 MG tablet Take 1 tablet (500 mg total) by mouth 2 (two) times daily with a meal. 180 tablet 1  . Multiple Vitamin (MULTIVITAMIN) tablet Take 1 tablet by mouth daily.      . norethindrone-ethinyl estradiol-iron (MICROGESTIN FE 1.5/30) 1.5-30 MG-MCG tablet Take 1 tablet by mouth at bedtime. Takes continuously    . NUEDEXTA 20-10 MG CAPS Take 1 tablet by mouth every 12 (twelve) hours.  12  . potassium chloride SA (KLOR-CON M20) 20 MEQ tablet Take 1 tablet (20 mEq total) by mouth daily. 90 tablet 1  . VRAYLAR 6 MG CAPS Take 1 tablet by mouth every evening.    . Zinc 50 MG CAPS Take 50 mg by mouth daily.    [name] amoxicillin-clavulanate (AUGMENTIN) 875-125 MG tablet Take 1 tablet by mouth every 12 (twelve) hours. (Patient not taking: Reported on 03/03/2018) 14 tablet 0  . oxyCODONE (OXY IR/ROXICODONE) 5 MG immediate release tablet Take 1-2 tablets (5-10 mg total) by mouth every 6 (six) hours as needed for moderate pain. (Patient not taking: Reported on 03/03/2018) 30 tablet 0   No facility-administered medications prior to visit.      Review of Systems:   Constitutional:   No  weight loss, night sweats,  Fevers, chills, fatigue, or  lassitude.  HEENT:   No headaches,  Difficulty swallowing,  Tooth/dental problems, or  Sore throat,                No sneezing, itching, ear ache, nasal congestion, post nasal drip,   CV:  No chest pain,  Orthopnea, PND, swelling in lower extremities, anasarca, dizziness, palpitations, syncope.   GI  No heartburn, indigestion, abdominal pain, nausea, vomiting, diarrhea, change in bowel habits, loss of appetite, bloody stools.   Resp: No shortness of breath with exertion or at rest.  No excess mucus, no productive cough,  No non-productive cough,  No coughing up of blood.  No  change in color of mucus.  No wheezing.  No chest wall deformity  Skin: no rash or lesions.  GU: no dysuria, change in color of urine, no urgency or frequency.  No flank pain, no hematuria  MS:  No joint pain or swelling.  No decreased range of motion.  No back pain.    Physical Exam  BP 126/82 (BP Location: Left Arm, Cuff Size: Normal)   Pulse 67   Ht 5\' 5"  (1.651 m)   Wt (!) 338 lb (153.3 kg)   SpO2 98%   BMI 56.25 kg/m   GEN: A/Ox3; pleasant , NAD, morbid obesity   HEENT:  [place]/AT,, NOSE-clear, THROAT-clear, no lesions, no postnasal drip or exudate noted.  Class III MP airway  NECK:  Supple w/ fair ROM; no JVD; normal carotid impulses w/o bruits; no thyromegaly or nodules palpated; no lymphadenopathy.    RESP  Clear  P & A; w/o, wheezes/ rales/ or rhonchi. no accessory muscle use, no dullness to percussion  CARD:  RRR, no m/r/g, no peripheral edema, pulses intact, no cyanosis or clubbing.  GI:   Soft & nt; nml bowel sounds; no organomegaly or masses detected.   Musco: Warm bil, no deformities or joint swelling noted.   Neuro: alert, no focal deficits noted.    Skin: Warm, no lesions or rashes    Lab Results:  CBC  BNP  Imaging: No results found.    No flowsheet data found.  No results found for: NITRICOXIDE      Assessment & Plan:   OSA (obstructive sleep apnea) Well-controlled on CPAP Order for refill of supplies  Plan  Patient Instructions  Continue on CPAP At bedtime   Keep up good work .  Work on healthy weight loss.  Do not drive if sleepy .  Follow up with Dr. Elsworth Soho  In 1 year and As needed  .      Obesity hypoventilation syndrome ([place]) Well-controlled on CPAP with no changes  Obesity Weight loss     Rexene Edison, NP 03/03/2018

## 2018-03-03 NOTE — Assessment & Plan Note (Signed)
Well-controlled on CPAP Order for refill of supplies  Plan  Patient Instructions  Continue on CPAP At bedtime   Keep up good work .  Work on healthy weight loss.  Do not drive if sleepy .  Follow up with Dr. Elsworth Soho  In 1 year and As needed  .

## 2018-03-03 NOTE — Assessment & Plan Note (Signed)
Well-controlled on CPAP with no changes

## 2018-03-03 NOTE — Assessment & Plan Note (Signed)
-   Weight loss 

## 2018-03-03 NOTE — Patient Instructions (Signed)
Continue on CPAP At bedtime   Keep up good work .  Work on healthy weight loss.  Do not drive if sleepy .  Follow up with Dr. Elsworth Soho  In 1 year and As needed  .

## 2018-03-04 ENCOUNTER — Ambulatory Visit (INDEPENDENT_AMBULATORY_CARE_PROVIDER_SITE_OTHER): Payer: BLUE CROSS/BLUE SHIELD | Admitting: Psychology

## 2018-03-04 DIAGNOSIS — F319 Bipolar disorder, unspecified: Secondary | ICD-10-CM

## 2018-03-11 ENCOUNTER — Ambulatory Visit: Payer: PRIVATE HEALTH INSURANCE | Admitting: Psychology

## 2018-03-18 ENCOUNTER — Ambulatory Visit: Payer: Medicare Other | Admitting: Psychology

## 2018-03-25 ENCOUNTER — Ambulatory Visit (INDEPENDENT_AMBULATORY_CARE_PROVIDER_SITE_OTHER): Payer: BLUE CROSS/BLUE SHIELD | Admitting: Psychology

## 2018-03-25 DIAGNOSIS — F319 Bipolar disorder, unspecified: Secondary | ICD-10-CM

## 2018-04-01 ENCOUNTER — Ambulatory Visit (INDEPENDENT_AMBULATORY_CARE_PROVIDER_SITE_OTHER): Payer: BLUE CROSS/BLUE SHIELD | Admitting: Psychology

## 2018-04-01 DIAGNOSIS — F319 Bipolar disorder, unspecified: Secondary | ICD-10-CM | POA: Diagnosis not present

## 2018-04-07 ENCOUNTER — Other Ambulatory Visit: Payer: Self-pay | Admitting: *Deleted

## 2018-04-07 MED ORDER — LIDOCAINE 5 % EX PTCH: 1.0000 | MEDICATED_PATCH | Freq: Every day | CUTANEOUS | 0 refills | Status: DC | PRN

## 2018-04-07 NOTE — Telephone Encounter (Signed)
Rx done. 

## 2018-04-08 ENCOUNTER — Ambulatory Visit: Payer: Medicare Other | Admitting: Psychology

## 2018-04-12 ENCOUNTER — Other Ambulatory Visit: Payer: Self-pay | Admitting: Family Medicine

## 2018-04-15 ENCOUNTER — Ambulatory Visit: Payer: Medicare Other | Admitting: Psychology

## 2018-04-22 ENCOUNTER — Ambulatory Visit (INDEPENDENT_AMBULATORY_CARE_PROVIDER_SITE_OTHER): Payer: BLUE CROSS/BLUE SHIELD | Admitting: Psychology

## 2018-04-22 ENCOUNTER — Ambulatory Visit: Payer: Medicare Other | Admitting: Psychology

## 2018-04-22 DIAGNOSIS — F319 Bipolar disorder, unspecified: Secondary | ICD-10-CM | POA: Diagnosis not present

## 2018-04-29 ENCOUNTER — Ambulatory Visit (INDEPENDENT_AMBULATORY_CARE_PROVIDER_SITE_OTHER): Payer: BLUE CROSS/BLUE SHIELD | Admitting: Psychology

## 2018-04-29 DIAGNOSIS — F319 Bipolar disorder, unspecified: Secondary | ICD-10-CM

## 2018-05-06 ENCOUNTER — Ambulatory Visit (INDEPENDENT_AMBULATORY_CARE_PROVIDER_SITE_OTHER): Payer: BLUE CROSS/BLUE SHIELD | Admitting: Psychology

## 2018-05-06 DIAGNOSIS — F319 Bipolar disorder, unspecified: Secondary | ICD-10-CM

## 2018-05-13 ENCOUNTER — Ambulatory Visit (INDEPENDENT_AMBULATORY_CARE_PROVIDER_SITE_OTHER): Payer: BLUE CROSS/BLUE SHIELD | Admitting: Psychology

## 2018-05-13 DIAGNOSIS — F319 Bipolar disorder, unspecified: Secondary | ICD-10-CM

## 2018-05-20 ENCOUNTER — Ambulatory Visit (INDEPENDENT_AMBULATORY_CARE_PROVIDER_SITE_OTHER): Payer: BLUE CROSS/BLUE SHIELD | Admitting: Psychology

## 2018-05-20 DIAGNOSIS — F319 Bipolar disorder, unspecified: Secondary | ICD-10-CM | POA: Diagnosis not present

## 2018-05-27 ENCOUNTER — Ambulatory Visit (INDEPENDENT_AMBULATORY_CARE_PROVIDER_SITE_OTHER): Payer: BLUE CROSS/BLUE SHIELD | Admitting: Psychology

## 2018-05-27 DIAGNOSIS — F319 Bipolar disorder, unspecified: Secondary | ICD-10-CM | POA: Diagnosis not present

## 2018-06-03 ENCOUNTER — Ambulatory Visit (INDEPENDENT_AMBULATORY_CARE_PROVIDER_SITE_OTHER): Payer: BLUE CROSS/BLUE SHIELD | Admitting: Psychology

## 2018-06-03 DIAGNOSIS — F319 Bipolar disorder, unspecified: Secondary | ICD-10-CM

## 2018-06-10 ENCOUNTER — Ambulatory Visit (INDEPENDENT_AMBULATORY_CARE_PROVIDER_SITE_OTHER): Payer: BLUE CROSS/BLUE SHIELD | Admitting: Psychology

## 2018-06-10 DIAGNOSIS — F319 Bipolar disorder, unspecified: Secondary | ICD-10-CM | POA: Diagnosis not present

## 2018-06-14 ENCOUNTER — Other Ambulatory Visit: Payer: Self-pay | Admitting: Cardiology

## 2018-06-16 NOTE — Telephone Encounter (Signed)
Klor-Con refilled 

## 2018-06-17 ENCOUNTER — Ambulatory Visit (INDEPENDENT_AMBULATORY_CARE_PROVIDER_SITE_OTHER): Payer: BLUE CROSS/BLUE SHIELD | Admitting: Psychology

## 2018-06-17 DIAGNOSIS — F319 Bipolar disorder, unspecified: Secondary | ICD-10-CM

## 2018-06-24 ENCOUNTER — Ambulatory Visit (INDEPENDENT_AMBULATORY_CARE_PROVIDER_SITE_OTHER): Payer: BLUE CROSS/BLUE SHIELD | Admitting: Psychology

## 2018-06-24 DIAGNOSIS — F319 Bipolar disorder, unspecified: Secondary | ICD-10-CM | POA: Diagnosis not present

## 2018-07-01 ENCOUNTER — Ambulatory Visit (INDEPENDENT_AMBULATORY_CARE_PROVIDER_SITE_OTHER): Payer: BC Managed Care – PPO | Admitting: Psychology

## 2018-07-01 DIAGNOSIS — F319 Bipolar disorder, unspecified: Secondary | ICD-10-CM

## 2018-07-02 ENCOUNTER — Telehealth: Payer: Self-pay | Admitting: *Deleted

## 2018-07-02 NOTE — Telephone Encounter (Signed)
A message was left, re: follow up visit. 

## 2018-07-08 ENCOUNTER — Ambulatory Visit (INDEPENDENT_AMBULATORY_CARE_PROVIDER_SITE_OTHER): Payer: BC Managed Care – PPO | Admitting: Psychology

## 2018-07-08 DIAGNOSIS — F319 Bipolar disorder, unspecified: Secondary | ICD-10-CM | POA: Diagnosis not present

## 2018-07-15 ENCOUNTER — Other Ambulatory Visit: Payer: Self-pay | Admitting: Family Medicine

## 2018-07-15 ENCOUNTER — Ambulatory Visit (INDEPENDENT_AMBULATORY_CARE_PROVIDER_SITE_OTHER): Payer: BC Managed Care – PPO | Admitting: Psychology

## 2018-07-15 DIAGNOSIS — F319 Bipolar disorder, unspecified: Secondary | ICD-10-CM | POA: Diagnosis not present

## 2018-07-22 ENCOUNTER — Ambulatory Visit (INDEPENDENT_AMBULATORY_CARE_PROVIDER_SITE_OTHER): Payer: BC Managed Care – PPO | Admitting: Psychology

## 2018-07-22 DIAGNOSIS — F319 Bipolar disorder, unspecified: Secondary | ICD-10-CM | POA: Diagnosis not present

## 2018-07-24 ENCOUNTER — Telehealth: Payer: Self-pay | Admitting: Pulmonary Disease

## 2018-07-24 NOTE — Telephone Encounter (Signed)
Called and spoke with Patient's Husband [name].  [name] stated Patient was denied cpap supplies from [place].  [name] stated they received a letter stating Patient is non compliant. [name] stated letter had a reference ID # [id number] phone [phone].  Per last OV 03/03/18, with TP, Patient was compliant. [address] [place], [place]- [phone], spoke with Anderson Malta, claims dept.  Anderson Malta stated Patient was denied, because she in non compliant.  I stated I have OV notes and copy of compliance report that I can fax showing cpap compliance.  Anderson Malta stated appeal would have to be started with [employer], phone [phone].  Ref # for call [id number]. Called [facility].  Explained situation.  Kenney Houseman stated they had no documentation stating Patient was denied. Kenney Houseman stated if Patient was billed, that was billing problem.  Requested [facility] fax number, so last OV and compliance report can be sent for proof.  Kenney Houseman stated she was adding my fax to Patient records. Kenney Houseman stated she was going to contact Patient for her to contact billing department. Last OV note and compliance report faxed to [facility].  Confirmation received.   Called Patient's Husband, [name].  Left detailed message letting him know information needed has been faxed, and to call if anything further is needed.

## 2018-07-29 ENCOUNTER — Ambulatory Visit (INDEPENDENT_AMBULATORY_CARE_PROVIDER_SITE_OTHER): Payer: BC Managed Care – PPO | Admitting: Psychology

## 2018-07-29 DIAGNOSIS — F319 Bipolar disorder, unspecified: Secondary | ICD-10-CM | POA: Diagnosis not present

## 2018-08-05 ENCOUNTER — Ambulatory Visit (INDEPENDENT_AMBULATORY_CARE_PROVIDER_SITE_OTHER): Payer: BC Managed Care – PPO | Admitting: Psychology

## 2018-08-05 DIAGNOSIS — F319 Bipolar disorder, unspecified: Secondary | ICD-10-CM | POA: Diagnosis not present

## 2018-08-12 ENCOUNTER — Ambulatory Visit (INDEPENDENT_AMBULATORY_CARE_PROVIDER_SITE_OTHER): Payer: BC Managed Care – PPO | Admitting: Psychology

## 2018-08-12 DIAGNOSIS — F319 Bipolar disorder, unspecified: Secondary | ICD-10-CM | POA: Diagnosis not present

## 2018-08-14 ENCOUNTER — Telehealth: Payer: Self-pay | Admitting: *Deleted

## 2018-08-14 NOTE — Telephone Encounter (Signed)
Spoke with patient regarding upcoming appointment with Dr Percival Spanish Patient does NOT want to change to in office visit secondary to Pandemic, will keep virtual visit

## 2018-08-16 ENCOUNTER — Other Ambulatory Visit: Payer: Self-pay | Admitting: Family Medicine

## 2018-08-17 NOTE — Progress Notes (Signed)
Virtual Visit via Video Note   This visit type was conducted due to national recommendations for restrictions regarding the COVID-19 Pandemic (e.g. social distancing) in an effort to limit this patient's exposure and mitigate transmission in our community.  Due to her co-morbid illnesses, this patient is at least at moderate risk for complications without adequate follow up.  This format is felt to be most appropriate for this patient at this time.  All issues noted in this document were discussed and addressed.  A limited physical exam was performed with this format.  Please refer to the patient's chart for her consent to telehealth for [facility].   Date:  08/18/2018   ID:  [name], DOB [birth year], MRN [id number]  Patient Location: Home Provider Location: Home  PCP:  Jessica Kern, DO  Cardiologist:  Minus Breeding, MD  Electrophysiologist:  None   Evaluation Performed:  Follow-Up Visit  Chief Complaint:  Dyspnea  History of Present Illness:    [name] is a 47 y.o. female with mildly reduced ejection fraction. Last year I had planned to get an echo but this was cancelled.  She does not feel comfortable going out of the house at this point.  She opted for a virtual clinic visit.  She says she is done well.  She denies any chest pressure, neck or arm discomfort.  She thinks her breathing is okay.  She is not having any PND or orthopnea.  Her weights have been up and down.  She is not having any new swelling.   The patient does not have symptoms concerning for COVID-19 infection (fever, chills, cough, or new shortness of breath).    Past Medical History:  Diagnosis Date  . Acute systolic congestive heart failure ([place])   . Agoraphobia   . Anxiety    panic attacks  . Arthritis    osteoarthritis bilal. knees  . Asthma    no per PFT 7/13; reports does not have asthma  . Bipolar disorder ([place])   . Carpal tunnel syndrome of left wrist 05/2011   Being  evaluated for MS  . Cor pulmonale ([place])   . Depression   . History of pleural effusion   . Hyperlipidemia   . Hypertension   . Hypoxemia    history of - no home O2  . IBS (irritable bowel syndrome)   . Morbid obesity ([place])   . Obesity hypoventilation syndrome ([place])   . Prediabetes   . Seasonal allergies    current runny nose  . Seizures ([place])    febrile seizure x 1 as a child. Several times  . Sleep apnea sleep study 07/02/2010   uses CPAP nightly  . Spinal stenosis in cervical region   . Urinary incontinence    Past Surgical History:  Procedure Laterality Date  . CARPAL TUNNEL RELEASE  06/27/2011   Procedure: CARPAL TUNNEL RELEASE;  Surgeon: Wynonia Sours, MD;  Location: [place];  Service: Orthopedics;  Laterality: Left;  . CARPAL TUNNEL RELEASE  12/19/2011   Procedure: CARPAL TUNNEL RELEASE;  Surgeon: Wynonia Sours, MD;  Location: [place];  Service: Orthopedics;  Laterality: Right;  . INCISION AND DRAINAGE ABSCESS Left 08/27/2017   Procedure: INCISION AND DRAINAGE LEFT BUTTOCK  ABSCESS;  Surgeon: Excell Seltzer, MD;  [facility];  Service: General;  Laterality: Left;  . IRRIGATION AND DEBRIDEMENT BUTTOCKS Left 08/30/2017   Procedure: IRRIGATION AND DEBRIDEMENT RE-EXCISION OF LEFT SUBCUTANEOUS BUTTOCKS ABCESS;  Surgeon: Kieth Brightly, Arta Bruce, MD;  [facility];  Service: General;  Laterality: Left;  [name] MASS EXCISION  08/07/2010   right index  . POSTERIOR CERVICAL FUSION/FORAMINOTOMY  09/11/2011   Procedure: POSTERIOR CERVICAL FUSION/FORAMINOTOMY LEVEL 1;  Surgeon: Erline Levine, MD;  Location: [place] [facility];  Service: Neurosurgery;  Laterality: N/A;  Cervical Three-Four Posteior Cervical Fusion and Decompression.  [name] TRIGGER FINGER RELEASE  12/19/2011   Procedure: RELEASE TRIGGER FINGER/A-1 PULLEY;  Surgeon: Wynonia Sours, MD;  Location: [place];  Service: Orthopedics;  Laterality: Right;  . TRIGGER FINGER RELEASE Left 12/31/2012    Procedure: RELEASE A-1 PULLEY LEFT THUMB;  Surgeon: Wynonia Sours, MD;  Location: [place];  Service: Orthopedics;  Laterality: Left;  . WISDOM TOOTH EXTRACTION       Current Meds  Medication Sig  . acetaminophen (TYLENOL) 500 MG tablet Take 2,000 mg by mouth 2 (two) times daily. Taking every day per patient  . albuterol (PROVENTIL HFA;VENTOLIN HFA) 108 (90 Base) MCG/ACT inhaler Inhale 2 puffs into the lungs every 6 (six) hours as needed for wheezing or shortness of breath.  . ALPRAZolam (XANAX) 1 MG tablet Take 1 mg by mouth 3 (three) times daily.   [name] amphetamine-dextroamphetamine (ADDERALL) 20 MG tablet Take 20 mg by mouth QID.  [name] Ascorbic Acid (VITAMIN C) 500 MG tablet Take 1,000 mg by mouth daily.   [name] aspirin 81 MG tablet Take 81 mg by mouth daily.   [name] atorvastatin (LIPITOR) 20 MG tablet Take 1 tablet (20 mg total) by mouth every evening.  [name] b complex vitamins tablet Take 1 tablet by mouth daily.  . Cholecalciferol (VITAMIN D3) 5000 UNITS TABS Take 4 tablets by mouth daily.   . Chromium Picolinate 800 MCG TABS Take 800 mcg by mouth daily.  . clonazePAM (KLONOPIN) 1 MG tablet Take 1 tablet (1 mg total) by mouth 3 (three) times daily.  . furosemide (LASIX) 40 MG tablet Take 2 tablets (80 mg total) by mouth 2 (two) times daily.  [name] KLOR-CON M20 20 MEQ tablet TAKE 1 TABLET DAILY  . lamoTRIgine (LAMICTAL) 200 MG tablet Take 200 mg by mouth 2 (two) times daily.   [name] lidocaine (LIDODERM) 5 % Place 1 patch onto the skin daily as needed (for knee pain). Remove & Discard patch within 12 hours or as directed by MD  . losartan (COZAAR) 25 MG tablet TAKE 1 TABLET TWICE A DAY  . LYRICA 300 MG capsule Take 1 tablet by mouth Twice daily.  [name] MAGNESIUM PO Take 1 tablet by mouth daily.  . metFORMIN (GLUCOPHAGE) 500 MG tablet TAKE 1 TABLET BY MOUTH TWICE A DAY WITH A MEAL.  . Multiple Vitamin (CALCIUM COMPLEX PO) Take 2 tablets by mouth daily.  . Multiple Vitamin (MULTIVITAMIN) tablet Take  1 tablet by mouth daily.    . norethindrone-ethinyl estradiol-iron (MICROGESTIN FE 1.5/30) 1.5-30 MG-MCG tablet Take 1 tablet by mouth at bedtime. Takes continuously  . NUEDEXTA 20-10 MG CAPS Take 1 tablet by mouth every 12 (twelve) hours.  . Omega-3 Fatty Acids (FISH OIL) 1200 MG CAPS Take 2 capsules by mouth daily.  [name] VRAYLAR 6 MG CAPS Take 1 tablet by mouth every evening.  . Zinc 50 MG CAPS Take 50 mg by mouth daily.  . [DISCONTINUED] atorvastatin (LIPITOR) 20 MG tablet TAKE 1 TABLET BY MOUTH EVERY DAY IN THE EVENING  . [DISCONTINUED] fluticasone (FLONASE) 50 MCG/ACT nasal spray Place 1 spray into both nostrils daily.  . [  DISCONTINUED] Meloxicam (MOBIC PO) Take 7.5 mg by mouth every 12 (twelve) hours.      Allergies:   Savella [milnacipran hcl]   Social History   Tobacco Use  . Smoking status: Never Smoker  . Smokeless tobacco: Never Used  Substance Use Topics  . Alcohol use: Yes    Comment: rare  . Drug use: No     Family Hx: The patient's family history includes Allergic rhinitis in her mother; Asthma in her maternal grandmother and mother; Multiple sclerosis in her mother.  ROS:   Please see the history of present illness.    As stated in the HPI and negative for all other systems.    Prior CV studies:   The following studies were reviewed today:  None  Labs/Other Tests and Data Reviewed:    EKG:  No ECG reviewed.  Recent Labs: 08/27/2017: ALT 22 08/31/2017: BUN 14; Creatinine, Ser 0.90; Hemoglobin 12.7; Platelets 382; Potassium 4.4; Sodium 142   Recent Lipid Panel Lab Results  Component Value Date/Time   CHOL 159 07/11/2017 03:38 PM   TRIG 98.0 07/11/2017 03:38 PM   HDL 63.80 07/11/2017 03:38 PM   CHOLHDL 2 07/11/2017 03:38 PM   LDLCALC 76 07/11/2017 03:38 PM    Wt Readings from Last 3 Encounters:  08/18/18 (!) 343 lb (155.6 kg)  03/03/18 (!) 338 lb (153.3 kg)  08/26/17 (!) 335 lb 8 oz (152.2 kg)     Objective:    Vital Signs:  BP 126/86   Pulse 72    Ht 5\' 5"  (1.651 m)   Wt (!) 343 lb (155.6 kg)   SpO2 97%   BMI 57.08 kg/m    VITAL SIGNS:  reviewed GEN:  no acute distress EYES:  sclerae anicteric, EOMI - Extraocular Movements Intact NEURO:  alert and oriented x 3, no obvious focal deficit PSYCH:  normal affect  ASSESSMENT & PLAN:    Cardiomyopathy - She would like to hold off on getting another echo.  I probably plan to do this next year hopefully when things have settled down.  She seems symptomatically fine.   Pulmonary HTN - This will be followed as above.   HTN -  Her BP is well controlled.  She does need blood work.  I am going to try to put blood work in for a basic metabolic profile, CBC, I5O and lipids in September in the office if she feels comfortable coming.  Otherwise he might need to try to get these done with home health.  Dyslipidemia - LDL and HDL were excellent in June of last year.  This will be checked as above.   COVID-19 Education: The signs and symptoms of COVID-19 were discussed with the patient and how to seek care for testing (follow up with PCP or arrange E-visit).  The importance of social distancing was discussed today.  Time:   Today, I have spent 16 minutes with the patient with telehealth technology discussing the above problems.     Medication Adjustments/Labs and Tests Ordered: Current medicines are reviewed at length with the patient today.  Concerns regarding medicines are outlined above.   Tests Ordered: No orders of the defined types were placed in this encounter.   Medication Changes: No orders of the defined types were placed in this encounter.   Follow Up:  Virtual Visit or In Person in one year.   Signed, Minus Breeding, MD  08/18/2018 12:23 PM    [facility]

## 2018-08-18 ENCOUNTER — Encounter: Payer: Self-pay | Admitting: Cardiology

## 2018-08-18 ENCOUNTER — Telehealth (INDEPENDENT_AMBULATORY_CARE_PROVIDER_SITE_OTHER): Payer: BC Managed Care – PPO | Admitting: Cardiology

## 2018-08-18 VITALS — BP 126/86 | HR 72 | Ht 65.0 in | Wt 343.0 lb

## 2018-08-18 DIAGNOSIS — I10 Essential (primary) hypertension: Secondary | ICD-10-CM | POA: Diagnosis not present

## 2018-08-18 DIAGNOSIS — E7849 Other hyperlipidemia: Secondary | ICD-10-CM | POA: Diagnosis not present

## 2018-08-18 DIAGNOSIS — I270 Primary pulmonary hypertension: Secondary | ICD-10-CM

## 2018-08-18 DIAGNOSIS — E785 Hyperlipidemia, unspecified: Secondary | ICD-10-CM

## 2018-08-18 DIAGNOSIS — I501 Left ventricular failure: Secondary | ICD-10-CM

## 2018-08-18 DIAGNOSIS — Z7189 Other specified counseling: Secondary | ICD-10-CM | POA: Diagnosis not present

## 2018-08-18 DIAGNOSIS — Z79899 Other long term (current) drug therapy: Secondary | ICD-10-CM

## 2018-08-19 ENCOUNTER — Ambulatory Visit (INDEPENDENT_AMBULATORY_CARE_PROVIDER_SITE_OTHER): Payer: BC Managed Care – PPO | Admitting: Psychology

## 2018-08-19 DIAGNOSIS — F319 Bipolar disorder, unspecified: Secondary | ICD-10-CM | POA: Diagnosis not present

## 2018-08-19 NOTE — Addendum Note (Signed)
Addended by: Waylan Rocher on: 08/19/2018 09:09 AM   Modules accepted: Orders

## 2018-08-19 NOTE — Patient Instructions (Addendum)
Medication Instructions:  The current medical regimen is effective;  continue present plan and medications as directed. Please refer to the Current Medication list given to you today. If you need a refill on your cardiac medications before your next appointment, please call your pharmacy.  Labwork: FASTING LIPID, BMET AND LFT-IN SEPTEMBER-HERE IN OUR OFFICE AT [facility]    You will need to fast. DO NOT EAT OR DRINK PAST MIDNIGHT.     Take the provided lab slips with you to the lab for your blood draw.   When you have your labs (blood work) drawn today and your tests are completely normal, you will receive your results only by MyChart Message (if you have MyChart) -OR-  A paper copy in the mail.  If you have any lab test that is abnormal or we need to change your treatment, we will call you to review these results.  Follow-Up: You will need a follow up appointment [place] may see Minus Breeding, MD or one of the following Advanced Practice Providers on your designated Care Team:  Rosaria Ferries, PA-C  Jory Sims, DNP, ANP     At [facility], you and your health needs are our priority.  As part of our continuing mission to provide you with exceptional heart care, we have created designated Provider Care Teams.  These Care Teams include your primary Cardiologist (physician) and Advanced Practice Providers (APPs -  Physician Assistants and Nurse Practitioners) who all work together to provide you with the care you need, when you need it.  Thank you for choosing [facility] at [facility]!!

## 2018-08-26 ENCOUNTER — Ambulatory Visit (INDEPENDENT_AMBULATORY_CARE_PROVIDER_SITE_OTHER): Payer: BC Managed Care – PPO | Admitting: Psychology

## 2018-08-26 DIAGNOSIS — F319 Bipolar disorder, unspecified: Secondary | ICD-10-CM | POA: Diagnosis not present

## 2018-09-02 ENCOUNTER — Ambulatory Visit (INDEPENDENT_AMBULATORY_CARE_PROVIDER_SITE_OTHER): Payer: BC Managed Care – PPO | Admitting: Psychology

## 2018-09-02 DIAGNOSIS — F319 Bipolar disorder, unspecified: Secondary | ICD-10-CM | POA: Diagnosis not present

## 2018-09-09 ENCOUNTER — Ambulatory Visit (INDEPENDENT_AMBULATORY_CARE_PROVIDER_SITE_OTHER): Payer: BC Managed Care – PPO | Admitting: Psychology

## 2018-09-09 DIAGNOSIS — F319 Bipolar disorder, unspecified: Secondary | ICD-10-CM

## 2018-09-16 ENCOUNTER — Ambulatory Visit (INDEPENDENT_AMBULATORY_CARE_PROVIDER_SITE_OTHER): Payer: BC Managed Care – PPO | Admitting: Psychology

## 2018-09-16 DIAGNOSIS — F319 Bipolar disorder, unspecified: Secondary | ICD-10-CM

## 2018-09-19 ENCOUNTER — Other Ambulatory Visit: Payer: Self-pay | Admitting: Family Medicine

## 2018-09-23 ENCOUNTER — Ambulatory Visit (INDEPENDENT_AMBULATORY_CARE_PROVIDER_SITE_OTHER): Payer: BC Managed Care – PPO | Admitting: Psychology

## 2018-09-23 DIAGNOSIS — F319 Bipolar disorder, unspecified: Secondary | ICD-10-CM | POA: Diagnosis not present

## 2018-09-25 ENCOUNTER — Other Ambulatory Visit: Payer: Self-pay | Admitting: Cardiology

## 2018-09-30 ENCOUNTER — Ambulatory Visit (INDEPENDENT_AMBULATORY_CARE_PROVIDER_SITE_OTHER): Payer: BC Managed Care – PPO | Admitting: Psychology

## 2018-09-30 DIAGNOSIS — F319 Bipolar disorder, unspecified: Secondary | ICD-10-CM

## 2018-10-07 ENCOUNTER — Ambulatory Visit (INDEPENDENT_AMBULATORY_CARE_PROVIDER_SITE_OTHER): Payer: BC Managed Care – PPO | Admitting: Psychology

## 2018-10-07 DIAGNOSIS — F319 Bipolar disorder, unspecified: Secondary | ICD-10-CM

## 2018-10-08 ENCOUNTER — Telehealth (INDEPENDENT_AMBULATORY_CARE_PROVIDER_SITE_OTHER): Payer: BC Managed Care – PPO | Admitting: Family Medicine

## 2018-10-08 ENCOUNTER — Encounter: Payer: Self-pay | Admitting: Family Medicine

## 2018-10-08 ENCOUNTER — Other Ambulatory Visit: Payer: Self-pay

## 2018-10-08 ENCOUNTER — Telehealth: Payer: Self-pay | Admitting: Pulmonary Disease

## 2018-10-08 ENCOUNTER — Ambulatory Visit: Payer: Self-pay | Admitting: *Deleted

## 2018-10-08 VITALS — BP 117/84 | HR 74 | Resp 16 | Ht 65.0 in | Wt 339.0 lb

## 2018-10-08 DIAGNOSIS — E1159 Type 2 diabetes mellitus with other circulatory complications: Secondary | ICD-10-CM | POA: Diagnosis not present

## 2018-10-08 DIAGNOSIS — J309 Allergic rhinitis, unspecified: Secondary | ICD-10-CM

## 2018-10-08 DIAGNOSIS — R040 Epistaxis: Secondary | ICD-10-CM

## 2018-10-08 DIAGNOSIS — I10 Essential (primary) hypertension: Secondary | ICD-10-CM

## 2018-10-08 DIAGNOSIS — I152 Hypertension secondary to endocrine disorders: Secondary | ICD-10-CM

## 2018-10-08 DIAGNOSIS — F419 Anxiety disorder, unspecified: Secondary | ICD-10-CM

## 2018-10-08 NOTE — Telephone Encounter (Signed)
Spoke with the pt and notified of recs per Judson Roch  She verbalized understanding  Referral to ENT placed

## 2018-10-08 NOTE — Progress Notes (Signed)
Virtual Visit via Video Note   I connected with [name] on 10/08/18 by a video enabled telemedicine application and verified that I am speaking with the correct person using two identifiers.  Location patient: home Location provider:work office Persons participating in the virtual visit: patient, provider  I discussed the limitations of evaluation and management by telemedicine and the availability of in person appointments. The patient expressed understanding and agreed to proceed.   HPI: [name] is a 48 yo female with hx of HTN,CHF,anxiety,depression,and OSA among some who is concerned about new onset of nose bleed. 2-3 days ago (Monday 9/7) she had first episode of nosebleed around 1 PM and early this morning around 4 AM she woke up and noted nosebleed. No Hx of trauma.  She has not identified exacerbating factors. Alleviated by pinching her nose and putting ice in her mouth. It takes max 3 min for bleeding to stop.  OSA on CPAP, she has a humidifier. According to patient, today she contacted pulmonologist's office, she was recommended to apply saline gel and offered ENT referral. She takes Aspirin 81 mg daily. She has not noted gum bleeding, blood in the stool, melena, gross hematuria, or easy bruising.  History of hypertension, Home BP readings have been "good." She has not noted unusual headache, visual changes, sore throat, chest pain, dyspnea, or diaphoresis. She is on Losartan 25 mg daily.  She mentioned that she has been under a lot of stress.  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  Anxiety and depression, states that this is the time of the year when her symptoms get worse. She follows with a psychiatrist every 2 weeks and psychotherapist every Tuesday at 2 PM.   ROS: See pertinent positives and negatives per HPI.  Past Medical History:  Diagnosis Date  . Acute systolic congestive heart failure ([place])    . Agoraphobia   . Anxiety    panic attacks  . Arthritis    osteoarthritis bilal. knees  . Asthma    no per PFT 7/13; reports does not have asthma  . Bipolar disorder ([place])   . Carpal tunnel syndrome of left wrist 05/2011   Being evaluated for Jessica  . Cor pulmonale ([place])   . Depression   . History of pleural effusion   . Hyperlipidemia   . Hypertension   . Hypoxemia    history of - no home O2  . IBS (irritable bowel syndrome)   . Morbid obesity ([place])   . Obesity hypoventilation syndrome ([place])   . Prediabetes   . Seasonal allergies    current runny nose  . Seizures ([place])    febrile seizure x 1 as a child. Several times  . Sleep apnea sleep study 07/02/2010   uses CPAP nightly  . Spinal stenosis in cervical region   . Urinary incontinence     Past Surgical History:  Procedure Laterality Date  . CARPAL TUNNEL RELEASE  06/27/2011   Procedure: CARPAL TUNNEL RELEASE;  Surgeon: Wynonia Sours, MD;  Location: [place];  Service: Orthopedics;  Laterality: Left;  . CARPAL TUNNEL RELEASE  12/19/2011   Procedure: CARPAL TUNNEL RELEASE;  Surgeon: Wynonia Sours, MD;  Location: [place];  Service: Orthopedics;  Laterality: Right;  . INCISION AND DRAINAGE ABSCESS Left 08/27/2017   Procedure: INCISION AND DRAINAGE LEFT BUTTOCK  ABSCESS;  Surgeon: Excell Seltzer, MD;  [facility];  Service:  General;  Laterality: Left;  . IRRIGATION AND DEBRIDEMENT BUTTOCKS Left 08/30/2017   Procedure: IRRIGATION AND DEBRIDEMENT RE-EXCISION OF LEFT SUBCUTANEOUS BUTTOCKS ABCESS;  Surgeon: Kinsinger, Arta Bruce, MD;  [facility];  Service: General;  Laterality: Left;  [name] MASS EXCISION  08/07/2010   right index  . POSTERIOR CERVICAL FUSION/FORAMINOTOMY  09/11/2011   Procedure: POSTERIOR CERVICAL FUSION/FORAMINOTOMY LEVEL 1;  Surgeon: Erline Levine, MD;  Location: [place] [facility];  Service: Neurosurgery;  Laterality: N/A;  Cervical Three-Four Posteior Cervical Fusion and  Decompression.  [name] TRIGGER FINGER RELEASE  12/19/2011   Procedure: RELEASE TRIGGER FINGER/A-1 PULLEY;  Surgeon: Wynonia Sours, MD;  Location: [place];  Service: Orthopedics;  Laterality: Right;  . TRIGGER FINGER RELEASE Left 12/31/2012   Procedure: RELEASE A-1 PULLEY LEFT THUMB;  Surgeon: Wynonia Sours, MD;  Location: [place];  Service: Orthopedics;  Laterality: Left;  . WISDOM TOOTH EXTRACTION      Family History  Problem Relation Age of Onset  . Asthma Mother   . Allergic rhinitis Mother   . Multiple sclerosis Mother   . Asthma Maternal Grandmother     Social History   Socioeconomic History  . Marital status: Married    Spouse name: Not on file  . Number of children: Not on file  . Years of education: Not on file  . Highest education level: Not on file  Occupational History  . Occupation: currently unemployed  Social Needs  . Financial resource strain: Not on file  . Food insecurity    Worry: Not on file    Inability: Not on file  . Transportation needs    Medical: Not on file    Non-medical: Not on file  Tobacco Use  . Smoking status: Never Smoker  . Smokeless tobacco: Never Used  Substance and Sexual Activity  . Alcohol use: Yes    Comment: rare  . Drug use: No  . Sexual activity: Not on file  Lifestyle  . Physical activity    Days per week: Not on file    Minutes per session: Not on file  . Stress: Not on file  Relationships  . Social Herbalist on phone: Not on file    Gets together: Not on file    Attends religious service: Not on file    Active member of club or organization: Not on file    Attends meetings of clubs or organizations: Not on file    Relationship status: Not on file  . Intimate partner violence    Fear of current or ex partner: Not on file    Emotionally abused: Not on file    Physically abused: Not on file    Forced sexual activity: Not on file  Other Topics Concern  . Not on file  Social  History Narrative   Lives in [place]          Current Outpatient Medications:  .  acetaminophen (TYLENOL) 500 MG tablet, Take 2,000 mg by mouth 2 (two) times daily. Taking every day per patient, Disp: , Rfl:  .  albuterol (PROVENTIL HFA;VENTOLIN HFA) 108 (90 Base) MCG/ACT inhaler, Inhale 2 puffs into the lungs every 6 (six) hours as needed for wheezing or shortness of breath., Disp: 1 Inhaler, Rfl: 3 .  ALPRAZolam (XANAX) 1 MG tablet, Take 1 mg by mouth 3 (three) times daily. , Disp: , Rfl:  .  amphetamine-dextroamphetamine (ADDERALL) 20 MG tablet, Take 20 mg by mouth QID.,  Disp: , Rfl:  .  Ascorbic Acid (VITAMIN C) 500 MG tablet, Take 1,000 mg by mouth daily. , Disp: , Rfl:  .  aspirin 81 MG tablet, Take 81 mg by mouth daily. , Disp: , Rfl:  .  atorvastatin (LIPITOR) 20 MG tablet, Take 1 tablet (20 mg total) by mouth every evening., Disp: 90 tablet, Rfl: 3 .  b complex vitamins tablet, Take 1 tablet by mouth daily., Disp: , Rfl:  .  Cholecalciferol (VITAMIN D3) 5000 UNITS TABS, Take 4 tablets by mouth daily. , Disp: , Rfl:  .  Chromium Picolinate 800 MCG TABS, Take 800 mcg by mouth daily., Disp: , Rfl:  .  clonazePAM (KLONOPIN) 1 MG tablet, Take 1 tablet (1 mg total) by mouth 3 (three) times daily., Disp: 30 tablet, Rfl: 0 .  furosemide (LASIX) 40 MG tablet, TAKE 2 TABLETS (80MG ) TWICEA DAY, Disp: 360 tablet, Rfl: 3 .  KLOR-CON M20 20 MEQ tablet, TAKE 1 TABLET DAILY, Disp: 90 tablet, Rfl: 1 .  lamoTRIgine (LAMICTAL) 200 MG tablet, Take 200 mg by mouth 2 (two) times daily. , Disp: , Rfl:  .  lidocaine (LIDODERM) 5 %, Place 1 patch onto the skin daily as needed (for knee pain). Remove & Discard patch within 12 hours or as directed by MD, Disp: 30 patch, Rfl: 0 .  losartan (COZAAR) 25 MG tablet, TAKE 1 TABLET TWICE A DAY, Disp: 180 tablet, Rfl: 3 .  LYRICA 300 MG capsule, Take 1 tablet by mouth Twice daily., Disp: , Rfl:  .  MAGNESIUM PO, Take 1 tablet by mouth daily., Disp: , Rfl:  .   metFORMIN (GLUCOPHAGE) 500 MG tablet, TAKE 1 TABLET BY MOUTH TWICE A DAY WITH MEALS, Disp: 60 tablet, Rfl: 0 .  Multiple Vitamin (CALCIUM COMPLEX PO), Take 2 tablets by mouth daily., Disp: , Rfl:  .  Multiple Vitamin (MULTIVITAMIN) tablet, Take 1 tablet by mouth daily.  , Disp: , Rfl:  .  norethindrone-ethinyl estradiol-iron (MICROGESTIN FE 1.5/30) 1.5-30 MG-MCG tablet, Take 1 tablet by mouth at bedtime. Takes continuously, Disp: , Rfl:  .  NUEDEXTA 20-10 MG CAPS, Take 1 tablet by mouth every 12 (twelve) hours., Disp: , Rfl: 12 .  Omega-3 Fatty Acids (FISH OIL) 1200 MG CAPS, Take 2 capsules by mouth daily., Disp: , Rfl:  .  VRAYLAR 6 MG CAPS, Take 1 tablet by mouth every evening., Disp: , Rfl:  .  Zinc 50 MG CAPS, Take 50 mg by mouth daily., Disp: , Rfl:   EXAM:  VITALS per patient if applicable:BP 123XX123   Pulse 74   Resp 16 Comment: approx  Ht 5\' 5"  (1.651 m)   Wt (!) 339 lb (153.8 kg)   SpO2 97%   BMI 56.41 kg/m   GENERAL: alert, oriented, appears well and in no acute distress  HEENT: atraumatic, conjunctiva clear, no obvious abnormalities on inspection.  NECK: normal movements of the head and neck  LUNGS: on inspection no signs of respiratory distress, breathing rate appears normal, no obvious gross SOB, gasping or wheezing  CV: no obvious cyanosis  Jessica: moves all visible extremities without noticeable abnormality  PSYCH/NEURO: pleasant and cooperative, labile and anxious. Speech and thought processing grossly intact  ASSESSMENT AND PLAN:  Discussed the following assessment and plan:  Epistaxis We discussed possible etiologies,explained that most of the time it is a benign problem. Nasal mucosa dryness or rhinitis among some. Hx does not suggest a serious process.  For now no changes on aspirin.  ENT referral also offered but she prefers to hold on it. Instructed about warning signs.  Options:Saline gel or [place] to rub on nasal septum.  Allergic rhinitis, unspecified  seasonality, unspecified trigger Nasal saline irrigations as needed. Avoid OTC antihistaminics or intranasal steroids.  Anxiety Mildly worse due to some stress related to COVID 19 pandemia and taking care of her parents. Continue following with psychiatrist and psychotherapist.  Hypertension associated with diabetes ([place]) BP seems to be adequately controlled. Continue monitoring closely. No changes in current management.     I discussed the assessment and treatment plan with the patient. She was provided an opportunity to ask questions and all were answered.She agreed with the plan and demonstrated an understanding of the instructions.   The patient was advised to call back or seek an in-person evaluation if the symptoms worsen or if the condition fails to improve as anticipated.  Return if symptoms worsen or fail to improve.    Lakenzie Mcclafferty [place], MD

## 2018-10-08 NOTE — Telephone Encounter (Signed)
Call returned to patient, she reports she has been having nose bleeds with small blood clots. She reports some episode are more severe than others. She reports she was almost choking on it because it was so much blood. She reports as a child she had bloody noses all the time. She reports she has been on cpap for 15 years. She does confirm that she has water in her humidifier. She does report she have an appt with her PCP this afternoon at 2:30pm. I encouraged her to be sure to tell her PCP because im not sure if the cpap is the cause being that she has been on cpap for 15 years. She states she puts a ice cube in her mouth and pinchs her nose to get it to stop bleeding. She reports has a SD card (not in [place]) in her machine. Unable to obtain download unless pt will be able to bring by office. Uses [facility] for cpap needs. Denies bleeding currently at this time. She reports the bleeding happened Monday and then again last night about 4am.   SG please advise. Thanks.

## 2018-10-08 NOTE — Telephone Encounter (Signed)
She needs to make sure she is using distilled water in her CPAP machine.  She needs to use [place] saline gel to prevent drying of her nasal mucosa. She needs to have her PCP check her nose today. She needs to sleep sitting propped up to prevent aspiration of blood with nose bleeds. Please place referral to ENT to eval asap. Nose bleeds  Thanks

## 2018-10-08 NOTE — Telephone Encounter (Signed)
  Husband, Ariani Mcdaris called in for his wife concerned about her having bad nosebleeds that started Monday.   Her BP has been elevated recently due to stress from her parents having health issues.   She takes aspirin 81 mg a day.  Wife is asleep right now reason husband calling in out of concern.  I called into the office for an appt.   He requested Dr. Martinique if possible since that's the last doctor she saw there.   (Dr. Maudie Mercury no longer at practice).    The office is going to call him back to make an appt after checking Dr. Doug Sou schedule.  [name] was agreeable to this plan.   He asked that they call him on his cell phone [phone].  Triage notes sent to Dr. Doug Sou office [place] [facility]. Reason for Disposition . [1] Bleeding recurs 3 or more times in 24 hours AND [2] direct pressure applied correctly    Bleeding since Monday about every 24 hours.  Answer Assessment - Initial Assessment Questions 1. AMOUNT OF BLEEDING: "How bad is the bleeding?" "How much blood was lost?" "Has the bleeding stopped?"   - MILD: needed a couple tissues   - MODERATE: needed many tissues   - SEVERE: large blood clots, soaked many tissues, lasted more than 30 minutes      Husband called in [place].    On Monday before lunch she called me into the bathroom.   Her nose was bleeding bad.   BP was high.   She takes BP medication.    2. ONSET: "When did the nosebleed start?"      Monday before lunch.    Tuesday night 4:00 AM she woke me up because her nose was bleeding.   Took 10 min to get it to stop.   It's clotting.   Goes down the back of her throat.   She spits it out.     Last night she had bleeding again.    3. FREQUENCY: "How many nosebleeds have you had in the last 24 hours?"      1 per day.   See above.    She has depression and elevated BP.    Her parents are having health issues so she is under a lot of stress.   4. RECURRENT SYMPTOMS: "Have there been other recent nosebleeds?" If so, ask: "How  long did it take you to stop the bleeding?" "What worked best?"      10 minutes to stop the bleeding.      Takes aspirin 81 mg a day.   5. CAUSE: "What do you think caused this nosebleed?"     Not sure per husband.    I think maybe stress form situation with her parents. 6. LOCAL FACTORS: "Do you have any cold symptoms?", "Have you been rubbing or picking at your nose?"     No 7. SYSTEMIC FACTORS: "Do you have high blood pressure or any bleeding problems?"     Yes elevated BP. 8. BLOOD THINNERS: "Do you take any blood thinners?" (e.g., coumadin, heparin, aspirin, Plavix)     Aspirin 81 mg a day. 9. OTHER SYMPTOMS: "Do you have any other symptoms?" (e.g., lightheadedness)     No dizziness or weakness. 10. PREGNANCY: "Is there any chance you are pregnant?" "When was your last menstrual period?"       Not asked.  Protocols used: [id number]

## 2018-10-08 NOTE — Telephone Encounter (Signed)
Patient was seen by virtual appt today.

## 2018-10-14 ENCOUNTER — Ambulatory Visit (INDEPENDENT_AMBULATORY_CARE_PROVIDER_SITE_OTHER): Payer: BC Managed Care – PPO | Admitting: Psychology

## 2018-10-14 DIAGNOSIS — F319 Bipolar disorder, unspecified: Secondary | ICD-10-CM

## 2018-10-21 ENCOUNTER — Ambulatory Visit (INDEPENDENT_AMBULATORY_CARE_PROVIDER_SITE_OTHER): Payer: BC Managed Care – PPO | Admitting: Psychology

## 2018-10-21 DIAGNOSIS — F319 Bipolar disorder, unspecified: Secondary | ICD-10-CM | POA: Diagnosis not present

## 2018-10-27 DIAGNOSIS — M19041 Primary osteoarthritis, right hand: Secondary | ICD-10-CM | POA: Diagnosis not present

## 2018-10-27 DIAGNOSIS — R2231 Localized swelling, mass and lump, right upper limb: Secondary | ICD-10-CM | POA: Diagnosis not present

## 2018-10-27 DIAGNOSIS — R200 Anesthesia of skin: Secondary | ICD-10-CM | POA: Diagnosis not present

## 2018-10-28 ENCOUNTER — Ambulatory Visit: Payer: Medicare Other | Admitting: Psychology

## 2018-10-28 ENCOUNTER — Ambulatory Visit (INDEPENDENT_AMBULATORY_CARE_PROVIDER_SITE_OTHER): Payer: BC Managed Care – PPO | Admitting: Psychology

## 2018-10-28 DIAGNOSIS — F319 Bipolar disorder, unspecified: Secondary | ICD-10-CM | POA: Diagnosis not present

## 2018-11-04 ENCOUNTER — Encounter: Payer: Medicare Other | Admitting: Family Medicine

## 2018-11-04 ENCOUNTER — Ambulatory Visit (INDEPENDENT_AMBULATORY_CARE_PROVIDER_SITE_OTHER): Payer: BC Managed Care – PPO | Admitting: Psychology

## 2018-11-04 DIAGNOSIS — F319 Bipolar disorder, unspecified: Secondary | ICD-10-CM | POA: Diagnosis not present

## 2018-11-11 ENCOUNTER — Ambulatory Visit (INDEPENDENT_AMBULATORY_CARE_PROVIDER_SITE_OTHER): Payer: BC Managed Care – PPO | Admitting: Psychology

## 2018-11-11 DIAGNOSIS — F319 Bipolar disorder, unspecified: Secondary | ICD-10-CM | POA: Diagnosis not present

## 2018-11-18 ENCOUNTER — Ambulatory Visit (INDEPENDENT_AMBULATORY_CARE_PROVIDER_SITE_OTHER): Payer: BC Managed Care – PPO | Admitting: Psychology

## 2018-11-18 DIAGNOSIS — F319 Bipolar disorder, unspecified: Secondary | ICD-10-CM | POA: Diagnosis not present

## 2018-11-24 DIAGNOSIS — M19041 Primary osteoarthritis, right hand: Secondary | ICD-10-CM | POA: Diagnosis not present

## 2018-11-25 ENCOUNTER — Ambulatory Visit (INDEPENDENT_AMBULATORY_CARE_PROVIDER_SITE_OTHER): Payer: BC Managed Care – PPO | Admitting: Psychology

## 2018-11-25 ENCOUNTER — Other Ambulatory Visit: Payer: Self-pay | Admitting: Cardiology

## 2018-11-25 DIAGNOSIS — F319 Bipolar disorder, unspecified: Secondary | ICD-10-CM | POA: Diagnosis not present

## 2018-11-25 MED ORDER — LOSARTAN POTASSIUM 25 MG PO TABS: 25.0000 mg | ORAL_TABLET | Freq: Two times a day (BID) | ORAL | 2 refills | Status: DC

## 2018-11-25 MED ORDER — ATORVASTATIN CALCIUM 20 MG PO TABS: 20.0000 mg | ORAL_TABLET | Freq: Every evening | ORAL | 2 refills | Status: DC

## 2018-11-25 NOTE — Telephone Encounter (Signed)
Rx(s) sent to pharmacy electronically.  

## 2018-11-25 NOTE — Telephone Encounter (Signed)
° ° ° °*  STAT* If patient is at the pharmacy, call can be transferred to refill team.   1. Which medications need to be refilled? (please list name of each medication and dose if known)  atorvastatin losartan    2. Which pharmacy/location (including street and city if local pharmacy) is medication to be sent to?[facility] 3. Do they need a 30 day or 90 day supply? [place]

## 2018-12-02 ENCOUNTER — Ambulatory Visit (INDEPENDENT_AMBULATORY_CARE_PROVIDER_SITE_OTHER): Payer: BC Managed Care – PPO | Admitting: Psychology

## 2018-12-02 DIAGNOSIS — F319 Bipolar disorder, unspecified: Secondary | ICD-10-CM | POA: Diagnosis not present

## 2018-12-09 ENCOUNTER — Ambulatory Visit (INDEPENDENT_AMBULATORY_CARE_PROVIDER_SITE_OTHER): Payer: BC Managed Care – PPO | Admitting: Psychology

## 2018-12-09 DIAGNOSIS — F319 Bipolar disorder, unspecified: Secondary | ICD-10-CM

## 2018-12-16 ENCOUNTER — Ambulatory Visit (INDEPENDENT_AMBULATORY_CARE_PROVIDER_SITE_OTHER): Payer: BC Managed Care – PPO | Admitting: Psychology

## 2018-12-16 DIAGNOSIS — F319 Bipolar disorder, unspecified: Secondary | ICD-10-CM

## 2018-12-23 ENCOUNTER — Ambulatory Visit (INDEPENDENT_AMBULATORY_CARE_PROVIDER_SITE_OTHER): Payer: BC Managed Care – PPO | Admitting: Psychology

## 2018-12-23 DIAGNOSIS — F319 Bipolar disorder, unspecified: Secondary | ICD-10-CM

## 2018-12-24 DIAGNOSIS — R200 Anesthesia of skin: Secondary | ICD-10-CM | POA: Diagnosis not present

## 2018-12-24 DIAGNOSIS — M4712 Other spondylosis with myelopathy, cervical region: Secondary | ICD-10-CM | POA: Diagnosis not present

## 2018-12-24 DIAGNOSIS — M5412 Radiculopathy, cervical region: Secondary | ICD-10-CM | POA: Diagnosis not present

## 2018-12-24 DIAGNOSIS — M4802 Spinal stenosis, cervical region: Secondary | ICD-10-CM | POA: Diagnosis not present

## 2018-12-24 DIAGNOSIS — G959 Disease of spinal cord, unspecified: Secondary | ICD-10-CM | POA: Diagnosis not present

## 2018-12-30 ENCOUNTER — Ambulatory Visit (INDEPENDENT_AMBULATORY_CARE_PROVIDER_SITE_OTHER): Payer: BC Managed Care – PPO | Admitting: Psychology

## 2018-12-30 DIAGNOSIS — F319 Bipolar disorder, unspecified: Secondary | ICD-10-CM | POA: Diagnosis not present

## 2019-01-02 ENCOUNTER — Other Ambulatory Visit: Payer: Self-pay | Admitting: Neurosurgery

## 2019-01-02 DIAGNOSIS — G959 Disease of spinal cord, unspecified: Secondary | ICD-10-CM

## 2019-01-05 ENCOUNTER — Ambulatory Visit
Admission: RE | Admit: 2019-01-05 | Discharge: 2019-01-05 | Disposition: A | Discharge status: Home / Self Care | Payer: Medicare Other | Source: Ambulatory Visit | Attending: Neurosurgery | Admitting: Neurosurgery

## 2019-01-05 DIAGNOSIS — M4802 Spinal stenosis, cervical region: Secondary | ICD-10-CM | POA: Diagnosis not present

## 2019-01-05 DIAGNOSIS — G959 Disease of spinal cord, unspecified: Secondary | ICD-10-CM

## 2019-01-06 ENCOUNTER — Ambulatory Visit (INDEPENDENT_AMBULATORY_CARE_PROVIDER_SITE_OTHER): Payer: BC Managed Care – PPO | Admitting: Psychology

## 2019-01-06 DIAGNOSIS — F319 Bipolar disorder, unspecified: Secondary | ICD-10-CM | POA: Diagnosis not present

## 2019-01-07 ENCOUNTER — Other Ambulatory Visit: Payer: Self-pay | Admitting: Cardiology

## 2019-01-13 ENCOUNTER — Ambulatory Visit (INDEPENDENT_AMBULATORY_CARE_PROVIDER_SITE_OTHER): Payer: BC Managed Care – PPO | Admitting: Psychology

## 2019-01-13 DIAGNOSIS — F319 Bipolar disorder, unspecified: Secondary | ICD-10-CM | POA: Diagnosis not present

## 2019-01-14 DIAGNOSIS — M4802 Spinal stenosis, cervical region: Secondary | ICD-10-CM | POA: Diagnosis not present

## 2019-01-14 DIAGNOSIS — M5412 Radiculopathy, cervical region: Secondary | ICD-10-CM | POA: Diagnosis not present

## 2019-01-14 DIAGNOSIS — G959 Disease of spinal cord, unspecified: Secondary | ICD-10-CM | POA: Diagnosis not present

## 2019-01-14 DIAGNOSIS — M4712 Other spondylosis with myelopathy, cervical region: Secondary | ICD-10-CM | POA: Diagnosis not present

## 2019-01-14 DIAGNOSIS — R200 Anesthesia of skin: Secondary | ICD-10-CM | POA: Diagnosis not present

## 2019-01-19 ENCOUNTER — Other Ambulatory Visit: Payer: Self-pay | Admitting: Cardiology

## 2019-01-20 ENCOUNTER — Ambulatory Visit (INDEPENDENT_AMBULATORY_CARE_PROVIDER_SITE_OTHER): Payer: BC Managed Care – PPO | Admitting: Psychology

## 2019-01-20 DIAGNOSIS — F319 Bipolar disorder, unspecified: Secondary | ICD-10-CM

## 2019-01-27 ENCOUNTER — Ambulatory Visit (INDEPENDENT_AMBULATORY_CARE_PROVIDER_SITE_OTHER): Payer: BC Managed Care – PPO | Admitting: Psychology

## 2019-01-27 DIAGNOSIS — F319 Bipolar disorder, unspecified: Secondary | ICD-10-CM | POA: Diagnosis not present

## 2019-02-03 ENCOUNTER — Ambulatory Visit (INDEPENDENT_AMBULATORY_CARE_PROVIDER_SITE_OTHER): Payer: BC Managed Care – PPO | Admitting: Psychology

## 2019-02-03 DIAGNOSIS — F319 Bipolar disorder, unspecified: Secondary | ICD-10-CM

## 2019-02-10 ENCOUNTER — Ambulatory Visit (INDEPENDENT_AMBULATORY_CARE_PROVIDER_SITE_OTHER): Payer: BC Managed Care – PPO | Admitting: Psychology

## 2019-02-10 DIAGNOSIS — F319 Bipolar disorder, unspecified: Secondary | ICD-10-CM

## 2019-02-17 ENCOUNTER — Ambulatory Visit (INDEPENDENT_AMBULATORY_CARE_PROVIDER_SITE_OTHER): Payer: BC Managed Care – PPO | Admitting: Psychology

## 2019-02-17 DIAGNOSIS — F319 Bipolar disorder, unspecified: Secondary | ICD-10-CM

## 2019-02-19 ENCOUNTER — Encounter: Payer: Self-pay | Admitting: Family Medicine

## 2019-02-23 ENCOUNTER — Ambulatory Visit (INDEPENDENT_AMBULATORY_CARE_PROVIDER_SITE_OTHER): Payer: BC Managed Care – PPO | Admitting: Psychology

## 2019-02-23 DIAGNOSIS — F319 Bipolar disorder, unspecified: Secondary | ICD-10-CM

## 2019-02-24 ENCOUNTER — Ambulatory Visit: Payer: Medicare Other | Admitting: Psychology

## 2019-03-03 ENCOUNTER — Ambulatory Visit (INDEPENDENT_AMBULATORY_CARE_PROVIDER_SITE_OTHER): Payer: BC Managed Care – PPO | Admitting: Psychology

## 2019-03-03 DIAGNOSIS — F319 Bipolar disorder, unspecified: Secondary | ICD-10-CM | POA: Diagnosis not present

## 2019-03-10 ENCOUNTER — Ambulatory Visit (INDEPENDENT_AMBULATORY_CARE_PROVIDER_SITE_OTHER): Payer: BC Managed Care – PPO | Admitting: Psychology

## 2019-03-10 DIAGNOSIS — F319 Bipolar disorder, unspecified: Secondary | ICD-10-CM

## 2019-03-17 ENCOUNTER — Ambulatory Visit (INDEPENDENT_AMBULATORY_CARE_PROVIDER_SITE_OTHER): Payer: BC Managed Care – PPO | Admitting: Psychology

## 2019-03-17 DIAGNOSIS — F319 Bipolar disorder, unspecified: Secondary | ICD-10-CM

## 2019-03-23 DIAGNOSIS — M5412 Radiculopathy, cervical region: Secondary | ICD-10-CM | POA: Diagnosis not present

## 2019-03-23 DIAGNOSIS — M4802 Spinal stenosis, cervical region: Secondary | ICD-10-CM | POA: Diagnosis not present

## 2019-03-23 DIAGNOSIS — G959 Disease of spinal cord, unspecified: Secondary | ICD-10-CM | POA: Diagnosis not present

## 2019-03-24 ENCOUNTER — Ambulatory Visit (INDEPENDENT_AMBULATORY_CARE_PROVIDER_SITE_OTHER): Payer: BC Managed Care – PPO | Admitting: Psychology

## 2019-03-24 DIAGNOSIS — F319 Bipolar disorder, unspecified: Secondary | ICD-10-CM | POA: Diagnosis not present

## 2019-03-27 ENCOUNTER — Other Ambulatory Visit: Payer: Self-pay | Admitting: Neurosurgery

## 2019-03-31 ENCOUNTER — Ambulatory Visit (INDEPENDENT_AMBULATORY_CARE_PROVIDER_SITE_OTHER): Payer: BC Managed Care – PPO | Admitting: Psychology

## 2019-03-31 DIAGNOSIS — F319 Bipolar disorder, unspecified: Secondary | ICD-10-CM | POA: Diagnosis not present

## 2019-04-07 ENCOUNTER — Ambulatory Visit (INDEPENDENT_AMBULATORY_CARE_PROVIDER_SITE_OTHER): Payer: BC Managed Care – PPO | Admitting: Psychology

## 2019-04-07 DIAGNOSIS — F319 Bipolar disorder, unspecified: Secondary | ICD-10-CM | POA: Diagnosis not present

## 2019-04-08 DIAGNOSIS — Z124 Encounter for screening for malignant neoplasm of cervix: Secondary | ICD-10-CM | POA: Diagnosis not present

## 2019-04-08 DIAGNOSIS — Z3040 Encounter for surveillance of contraceptives, unspecified: Secondary | ICD-10-CM | POA: Diagnosis not present

## 2019-04-08 DIAGNOSIS — Z01419 Encounter for gynecological examination (general) (routine) without abnormal findings: Secondary | ICD-10-CM | POA: Diagnosis not present

## 2019-04-08 DIAGNOSIS — Z1231 Encounter for screening mammogram for malignant neoplasm of breast: Secondary | ICD-10-CM | POA: Diagnosis not present

## 2019-04-14 ENCOUNTER — Ambulatory Visit (INDEPENDENT_AMBULATORY_CARE_PROVIDER_SITE_OTHER): Payer: BC Managed Care – PPO | Admitting: Psychology

## 2019-04-14 DIAGNOSIS — F319 Bipolar disorder, unspecified: Secondary | ICD-10-CM | POA: Diagnosis not present

## 2019-04-16 NOTE — Progress Notes (Signed)
Patient's husband called and questioned whether or not he could spend the night with his wife. Stated she had depression and anxiety and that he was her support person that needed to be with her. Instructed patient's husband that he would have to get permission from patient experience to spend the night. Verbalized understanding and transferred patient to [phone].

## 2019-04-21 ENCOUNTER — Ambulatory Visit (INDEPENDENT_AMBULATORY_CARE_PROVIDER_SITE_OTHER): Payer: BC Managed Care – PPO | Admitting: Psychology

## 2019-04-21 DIAGNOSIS — F319 Bipolar disorder, unspecified: Secondary | ICD-10-CM

## 2019-04-24 ENCOUNTER — Ambulatory Visit (INDEPENDENT_AMBULATORY_CARE_PROVIDER_SITE_OTHER): Payer: BC Managed Care – PPO | Admitting: Adult Health

## 2019-04-24 ENCOUNTER — Other Ambulatory Visit: Payer: Self-pay

## 2019-04-24 ENCOUNTER — Encounter: Payer: Self-pay | Admitting: Adult Health

## 2019-04-24 DIAGNOSIS — G4733 Obstructive sleep apnea (adult) (pediatric): Secondary | ICD-10-CM

## 2019-04-24 NOTE — Progress Notes (Signed)
Virtual Visit via Telephone Note  I connected with [name] on 04/24/19 at 11:45 AM EDT by telephone and verified that I am speaking with the correct person using two identifiers.  Location: Patient: Home  Provider: Home    I discussed the limitations, risks, security and privacy concerns of performing an evaluation and management service by telephone and the availability of in person appointments. I also discussed with the patient that there may be a patient responsible charge related to this service. The patient expressed understanding and agreed to proceed.   History of Present Illness: 49 year old female never smoker followed for obstructive sleep apnea, OHS and mild intermittent asthma No significant diabetes and bipolar disorder  Today's televisit is a 1 year follow-up for sleep apnea.  Patient has underlying severe sleep apnea she is on nocturnal BiPAP.  She says she is doing very well.  She feels that she benefits from CPAP and has no significant daytime sleepiness. She is wearing CPAP each night for 9-10 hr each night .  Download shows excellent compliance with daily average usage at 7 hours.  AHI 1.1.  Patient says she is doing well.  She is working on weight loss and is down 6 pounds.  Says her CPAP machine is very old.  And would like a new machine.  She does have upcoming back surgery next month with Dr. Vertell Limber.  They are going to do a cervical and thoracic fusion.   Observations/Objective:  04/2010 ABG was 7.40/49/63 on 4 L [place]. [name]   PSG2002>>severe OSA with AHI 40/h, lowest desatn to 52%>>8 cm H2O -wt 325 lbs  CPAP titration study >>required 10-16cm CPAP, higher pressure when supine/ REM sleep.    PFTs 06/2011 - no obstruction, mild restriction, FVC 78%, DLCO 64%   Assessment and Plan: Obstructive sleep apnea with excellent control and compliance on nocturnal CPAP. Patient's machine is old and is in need of updating.  Order for new machine has been  placed  Plan  Patient Instructions  Continue on CPAP At bedtime   Order for new CPAP machine .  Keep up good work .  Work on healthy weight loss.  Do not drive if sleepy .  Covid vaccine as discussed.  Follow up with Dr. Elsworth Soho  In 1 year and As needed  .      Follow Up Instructions: Follow-up in 1 year and as needed    I discussed the assessment and treatment plan with the patient. The patient was provided an opportunity to ask questions and all were answered. The patient agreed with the plan and demonstrated an understanding of the instructions.   The patient was advised to call back or seek an in-person evaluation if the symptoms worsen or if the condition fails to improve as anticipated.  I provided 21n minutes of non-face-to-face time during this encounter.   Rexene Edison, NP

## 2019-04-24 NOTE — Patient Instructions (Addendum)
Continue on CPAP At bedtime   Order for new CPAP machine .  Keep up good work .  Work on healthy weight loss.  Do not drive if sleepy .  Covid vaccine as discussed.  Follow up with Dr. Elsworth Soho  In 1 year and As needed  .

## 2019-04-28 ENCOUNTER — Ambulatory Visit (INDEPENDENT_AMBULATORY_CARE_PROVIDER_SITE_OTHER): Payer: BC Managed Care – PPO | Admitting: Psychology

## 2019-04-28 DIAGNOSIS — F319 Bipolar disorder, unspecified: Secondary | ICD-10-CM

## 2019-05-04 ENCOUNTER — Telehealth: Payer: Self-pay | Admitting: Family Medicine

## 2019-05-04 NOTE — H&P (Signed)
Patient ID:   [id number] Patient: [name]  Date of Birth: 26-Jun-1970 Visit Type: Office Visit   Date: 03/23/2019 03:15 PM Provider: Marchia Meiers. Vertell Limber MD   This 49 year old female presents for neck pain.  HISTORY OF PRESENT ILLNESS: 1.  neck pain  The patient returns today for recheck.  She had a myelopathy due to move PL L with cervical stenosis and cord compression with upper extremity weakness.  We felt at the time that this was an elective surgery as she was not clearly progressing rapidly and because COVID case rates were increasing we elected to hold off on proceeding with surgery  She now comes in with less pain but with persistent numbness into her left thumb and left arm and on examination she has weakness in both upper extremities involving her hand intrinsics at 4/5 and finger extensors at 4/5 bilaterally.  I do not believe that her weakness has improved.  I think that it makes sense to go ahead with surgery.  We discussed this in detail.  I have recommended proceeding with exploration of prior C3-4 fusion with C4-T1 decompression and fusion.  Because of her extremely large body habitus I think radiographic imaging will be very difficult and I have therefore recommended proceeding with Airo intraoperative CT for navigation purposes.      Medical/Surgical/Interim History Reviewed, no change.  Last detailed document date:09/08/2012.     PAST MEDICAL HISTORY, SURGICAL HISTORY, FAMILY HISTORY, SOCIAL HISTORY AND REVIEW OF SYSTEMS I have reviewed the patient's past medical, surgical, family and social history as well as the comprehensive review of systems as included on the [facility] history form dated 12/24/2018, which I have signed.  Family History: Reviewed, no changes.  Last detailed document date:09/08/2012.   Social History: Reviewed, no changes. Last detailed document date: 09/08/2012.    MEDICATIONS: (added, continued or  stopped this visit) Started Medication Directions Instruction Stopped  alprazolam 1 mg tablet take 1 tablet by oral route 3 times every day    carbamazepine 200 mg tablet taken 1-3 qHS prn    clonazepam 1 mg tablet take 1 tablet by oral route 3 times every day    ConZip 200 mg capsule,extended release (25-75) take 1 capsule by oral route  every day    Klor-Con M20 mEq tablet,extended release take 1 tablet by oral route  every day with food    Lamictal 200 mg tablet take 1 tablet by oral route 2 times every day    Lasix 40 mg tablet taken 2 q am, 1 qHS    Latuda 120 mg tablet take 1 tablet by oral route  every day with food (at least 350 calories)    Lidoderm 5 % (700 mg/patch) adhesive patch apply 1 patch by transdermal route  every day (May wear up to 12hours.)    losartan 25 mg tablet take 1 tablet by oral route 2 times every day    Lyrica 300 mg capsule take 1 capsule by oral route 2 times every day    metformin 500 mg tablet taken 1 am, 1 noon, 2 with dinner    Microgestin 1.5/30 (21) 1.5 mg-30 mcg tablet take 1 tablet by oral route  every day   01/15/2019 Mobic 7.5 mg tablet taken 1 BID    Ritalin 20 mg tablet taken 1.5 tab qam, 1.5 tab noon, 1tab pm    Robaxin-750 750 mg tablet take 1 tablet by oral route  every 8 hours  01/14/2019 tramadol 50 mg tablet taken 1 tabs TID as needed    Voltaren 1 % topical gel apply (2G)  by topical route 3 times every day to the affected area(s)      ALLERGIES: Ingredient Reaction Medication Name Comment NO KNOWN ALLERGIES    No known allergies. Reviewed, no changes.   REVIEW OF SYSTEMS  See scanned patient registration form, dated 12/24/2018, signed and dated on 03/23/2019  Review of Systems Details System Neg/Pos Details Neuro Positive Extremity weakness, Numbness in extremity.  PHYSICAL EXAM:  Vitals Date Temp F BP Pulse Ht In Wt  Lb BMI BSA Pain Score 03/23/2019 96.9 166/92 71 65    4/10     IMPRESSION:  Persistent significant cord compression with cervical myelopathy.  PLAN: Proceed with posterior cervical decompression and fusion  Orders: Diagnostic Procedures: Assessment Procedure M54.12 Cervical Spine- AP/Lat Instruction(s)/Education: Assessment Instruction I10 Lifestyle education Miscellaneous: Assessment  M48.02 Hard Cervical Collar  Completed Orders (this encounter) Order Details Reason Side Interpretation Result Initial Treatment Date Region Lifestyle education Patient will follow up with Primary Care Physician.        Assessment/Plan  # Detail Type Description  1. Assessment Cervical stenosis of spinal canal (M48.02).  Plan Orders Hard Cervical Collar.     2. Assessment Cervical myelopathy (G95.9).     3. Assessment Cervical radiculopathy (M54.12).     4. Assessment Essential (primary) hypertension (I10).       Pain Management Plan Pain Scale: 4/10. Method: Numeric Pain Intensity Scale. Location: neck. Onset: 12/24/2018. Duration: varies. Quality: discomforting. Pain management follow-up plan of care: Patient will continue medication management..              Provider:  Marchia Meiers. Vertell Limber MD  03/24/2019 01:40 PM    Dictation edited by: Marchia Meiers. Vertell Limber    CC Providers: Betty  Martinique [address] [place],  [place]  [address]   [facility] [address] [place], [place] [address]               Electronically signed by Marchia Meiers. Vertell Limber MD on 03/24/2019 01:40 PM

## 2019-05-04 NOTE — Telephone Encounter (Signed)
Pt call and want her prescription for metFORMIN (GLUCOPHAGE) 500 MG tablet sent to [facility]

## 2019-05-05 ENCOUNTER — Ambulatory Visit (INDEPENDENT_AMBULATORY_CARE_PROVIDER_SITE_OTHER): Payer: BC Managed Care – PPO | Admitting: Psychology

## 2019-05-05 DIAGNOSIS — F319 Bipolar disorder, unspecified: Secondary | ICD-10-CM | POA: Diagnosis not present

## 2019-05-06 ENCOUNTER — Other Ambulatory Visit: Payer: Self-pay | Admitting: *Deleted

## 2019-05-06 MED ORDER — METFORMIN HCL 500 MG PO TABS: 500.0000 mg | ORAL_TABLET | Freq: Two times a day (BID) | ORAL | 0 refills | Status: DC

## 2019-05-06 NOTE — Telephone Encounter (Signed)
30 day supply sent to the pharmacy. Patient aware and verbalized understanding that she need to schedule an ov for further refills.

## 2019-05-08 ENCOUNTER — Other Ambulatory Visit: Payer: Self-pay | Admitting: Family Medicine

## 2019-05-08 ENCOUNTER — Telehealth: Payer: Self-pay | Admitting: Family Medicine

## 2019-05-08 MED ORDER — LIDOCAINE 5 % EX PTCH: 1.0000 | MEDICATED_PATCH | Freq: Every day | CUTANEOUS | 0 refills | Status: DC | PRN

## 2019-05-08 NOTE — Telephone Encounter (Signed)
Sent refill for Lidocaine 5% listed on med list and prescribed by Dr Maudie Mercury before. Thanks, BJ

## 2019-05-08 NOTE — Telephone Encounter (Signed)
Noted  

## 2019-05-08 NOTE — Telephone Encounter (Signed)
Message sent to PCP for review and approval. °

## 2019-05-08 NOTE — Telephone Encounter (Signed)
lidocaine (LIDODERM) 5 %  [facility] [place], [place] AT [place] Phone:  [phone]  Fax:  [phone]

## 2019-05-11 ENCOUNTER — Encounter (HOSPITAL_COMMUNITY)
Admission: RE | Admit: 2019-05-11 | Discharge: 2019-05-11 | Disposition: A | Discharge status: Home / Self Care | Payer: BC Managed Care – PPO | Source: Ambulatory Visit | Attending: Neurosurgery | Admitting: Neurosurgery

## 2019-05-11 ENCOUNTER — Encounter (HOSPITAL_COMMUNITY): Payer: Self-pay

## 2019-05-11 ENCOUNTER — Other Ambulatory Visit (HOSPITAL_COMMUNITY)
Admission: RE | Admit: 2019-05-11 | Discharge: 2019-05-11 | Disposition: A | Discharge status: Home / Self Care | Payer: BC Managed Care – PPO | Source: Ambulatory Visit | Attending: Neurosurgery | Admitting: Neurosurgery

## 2019-05-11 ENCOUNTER — Other Ambulatory Visit: Payer: Self-pay

## 2019-05-11 DIAGNOSIS — J4520 Mild intermittent asthma, uncomplicated: Secondary | ICD-10-CM | POA: Diagnosis not present

## 2019-05-11 DIAGNOSIS — E662 Morbid (severe) obesity with alveolar hypoventilation: Secondary | ICD-10-CM | POA: Diagnosis not present

## 2019-05-11 DIAGNOSIS — Z01818 Encounter for other preprocedural examination: Secondary | ICD-10-CM | POA: Insufficient documentation

## 2019-05-11 DIAGNOSIS — Z20822 Contact with and (suspected) exposure to covid-19: Secondary | ICD-10-CM | POA: Diagnosis not present

## 2019-05-11 DIAGNOSIS — Z6841 Body Mass Index (BMI) 40.0 and over, adult: Secondary | ICD-10-CM | POA: Insufficient documentation

## 2019-05-11 HISTORY — DX: Prediabetes: R73.03

## 2019-05-11 HISTORY — DX: Pneumonia, unspecified organism: J18.9

## 2019-05-11 LAB — BASIC METABOLIC PANEL
Anion gap: 15 (ref 5–15)
BUN: 16 mg/dL (ref 6–20)
CO2: 24 mmol/L (ref 22–32)
Calcium: 9.1 mg/dL (ref 8.9–10.3)
Chloride: 101 mmol/L (ref 98–111)
Creatinine, Ser: 0.97 mg/dL (ref 0.44–1.00)
GFR calc Af Amer: >60 mL/min (ref >60)
GFR calc non Af Amer: >60 mL/min (ref >60)
Glucose, Bld: 121 mg/dL — ABNORMAL HIGH (ref 70–99)
Potassium: 4.3 mmol/L (ref 3.5–5.1)
Sodium: 140 mmol/L (ref 135–145)

## 2019-05-11 LAB — SARS CORONAVIRUS 2 (TAT 6-24 HRS): SARS Coronavirus 2: NEGATIVE

## 2019-05-11 LAB — CBC
HCT: 52.3 % — ABNORMAL HIGH (ref 36.0–46.0)
Hemoglobin: 16.6 g/dL — ABNORMAL HIGH (ref 12.0–15.0)
MCH: 31.8 pg (ref 26.0–34.0)
MCHC: 31.7 g/dL (ref 30.0–36.0)
MCV: 100.2 fL — ABNORMAL HIGH (ref 80.0–100.0)
Platelets: 239 10*3/uL (ref 150–400)
RBC: 5.22 MIL/uL — ABNORMAL HIGH (ref 3.87–5.11)
RDW: 14.1 % (ref 11.5–15.5)
WBC: 8.2 10*3/uL (ref 4.0–10.5)
nRBC: 0 % (ref 0.0–0.2)

## 2019-05-11 LAB — SURGICAL PCR SCREEN
MRSA, PCR: NEGATIVE
Staphylococcus aureus: POSITIVE — AB

## 2019-05-11 LAB — ABO/RH: ABO/RH(D): O POS

## 2019-05-11 LAB — TYPE AND SCREEN
ABO/RH(D): O POS
Antibody Screen: NEGATIVE

## 2019-05-11 LAB — GLUCOSE, CAPILLARY: Glucose-Capillary: 148 mg/dL — ABNORMAL HIGH (ref 70–99)

## 2019-05-11 NOTE — Progress Notes (Signed)
HCT @ PAT appointment - 52.3  Chart sent to anesthesia for review.  Dr. Vertell Limber notified via in box message.

## 2019-05-11 NOTE — Progress Notes (Signed)
[facility] [address], [place] [address] [place] [address] Phone: [phone] Fax: [phone]  [facility] [place] [address] [place] [address] Phone: [phone] Fax: [place], [place] [address] [place] [address] Phone: [phone] Fax: [phone]      Your procedure is scheduled on Thursday, April 15th.  Report to [facility] Main Entrance "A" at 5:30 A.M., and check in at the Admitting office.  Call this number if you have problems the morning of surgery:  [phone]  Call [phone] if you have any questions prior to your surgery date Monday-Friday 8am-4pm    Remember:  Do not eat or drink after midnight the night before your surgery    Take these medicines the morning of surgery with A SIP OF WATER   Tylenol - if needed  Alprazolam (Xanax)  Atorvastatin (Lipitor)  Clonazepam (Klonopin)  Lamictal  Lyrica   WHAT DO I DO ABOUT MY DIABETES MEDICATION?   [name] Do not take oral diabetes medicines (pills) the morning of surgery. - Metformin   HOW TO MANAGE YOUR DIABETES BEFORE AND AFTER SURGERY  Why is it important to control my blood sugar before and after surgery? . Improving blood sugar levels before and after surgery helps healing and can limit problems. . A way of improving blood sugar control is eating a healthy diet by: o  Eating less sugar and carbohydrates o  Increasing activity/exercise o  Talking with your doctor about reaching your blood sugar goals . High blood sugars (greater than 180 mg/dL) can raise your risk of infections and slow your recovery, so you will need to focus on controlling your diabetes during the weeks before surgery. . Make sure that the doctor who takes care of your  diabetes knows about your planned surgery including the date and location.  How do I manage my blood sugar before surgery? . Check your blood sugar at least 4 times a day, starting 2 days before surgery, to make sure that the level is not too high or low. . Check your blood sugar the morning of your surgery when you wake up and every 2 hours until you get to the Short Stay unit. o If your blood sugar is less than 70 mg/dL, you will need to treat for low blood sugar: - Do not take insulin. - Treat a low blood sugar (less than 70 mg/dL) with  cup of clear juice (cranberry or apple), 4 glucose tablets, OR glucose gel. - Recheck blood sugar in 15 minutes after treatment (to make sure it is greater than 70 mg/dL). If your blood sugar is not greater than 70 mg/dL on recheck, call [phone] for further instructions. . Report your blood sugar to the short stay nurse when you get to Short Stay.  . If you are admitted to the hospital after surgery: o Your blood sugar will be checked by the staff and you will probably be given insulin after surgery (instead of oral diabetes medicines) to make sure you have good blood sugar levels. o The goal for blood sugar control after surgery is 80-180 mg/dL.    As of today, STOP taking any Aspirin (unless otherwise instructed by your surgeon) and Aspirin containing products, Aleve, Naproxen, Ibuprofen, Motrin,  Advil, Goody's, BC's, all herbal medications, fish oil, and all vitamins.                      Do not wear jewelry, make up, or nail polish            Do not wear lotions, powders, perfumes, or deodorant.            Do not shave 48 hours prior to surgery.              Do not bring valuables to the hospital.            [facility] is not responsible for any belongings or valuables.  Do NOT Smoke (Tobacco/Vapping) or drink Alcohol 24 hours prior to your procedure If you use a CPAP at night, you may bring all equipment for your overnight stay.    Contacts, glasses, dentures or bridgework may not be worn into surgery.      For patients admitted to the hospital, discharge time will be determined by your treatment team.   Patients discharged the day of surgery will not be allowed to drive home, and someone needs to stay with them for 24 hours.    Special instructions:   [facility]- Preparing For Surgery  Before surgery, you can play an important role. Because skin is not sterile, your skin needs to be as free of germs as possible. You can reduce the number of germs on your skin by washing with CHG (chlorahexidine gluconate) Soap before surgery.  CHG is an antiseptic cleaner which kills germs and bonds with the skin to continue killing germs even after washing.    Oral Hygiene is also important to reduce your risk of infection.  Remember - BRUSH YOUR TEETH THE MORNING OF SURGERY WITH YOUR REGULAR TOOTHPASTE  Please do not use if you have an allergy to CHG or antibacterial soaps. If your skin becomes reddened/irritated stop using the CHG.  Do not shave (including legs and underarms) for at least 48 hours prior to first CHG shower. It is OK to shave your face.  Please follow these instructions carefully.   1. Shower the NIGHT BEFORE SURGERY and the MORNING OF SURGERY with CHG Soap.   2. If you chose to wash your hair, wash your hair first as usual with your normal shampoo.  3. After you shampoo, rinse your hair and body thoroughly to remove the shampoo.  4. Use CHG as you would any other liquid soap. You can apply CHG directly to the skin and wash gently with a scrungie or a clean washcloth.   5. Apply the CHG Soap to your body ONLY FROM THE NECK DOWN.  Do not use on open wounds or open sores. Avoid contact with your eyes, ears, mouth and genitals (private parts). Wash Face and genitals (private parts)  with your normal soap.   6. Wash thoroughly, paying special attention to the area where your surgery will be  performed.  7. Thoroughly rinse your body with warm water from the neck down.  8. DO NOT shower/wash with your normal soap after using and rinsing off the CHG Soap.  9. Pat yourself dry with a CLEAN TOWEL.  10. Wear CLEAN PAJAMAS to bed the night before surgery, wear comfortable clothes the morning of surgery  11. Place CLEAN SHEETS on your bed the night of your first shower and DO NOT SLEEP WITH PETS.   Day of Surgery:   Do not  apply any deodorants/lotions.  Please wear clean clothes to the hospital/surgery center.   Remember to brush your teeth WITH YOUR REGULAR TOOTHPASTE.   Please read over the following fact sheets that you were given.

## 2019-05-11 NOTE — Progress Notes (Signed)
PCP - Dr. Betty Martinique Cardiologist - Dr. Minus Breeding  PPM/ICD - Denies  Chest x-ray - N/A EKG - 05/11/19 Stress Test - Patient stated done 5-7 years ago @ [place] [facility] - 2017 Cardiac Cath - Denies  Sleep Study - Yes CPAP - Yes wears 10 hours day  DM-Prediabetic Type II Fasting Blood Sugar -Patient doesn't check CBG's @ home CBG  on arrival to PAT  COVID TEST- 05/11/19  Anesthesia review: H/O Obesity, Hypoventilation Syndrome, Seizures, had incident with prior intubation (Per patient tongue fell to side and was cut)  Patient denies shortness of breath, fever, cough and chest pain at PAT appointment   All instructions explained to the patient, with a verbal understanding of the material. Patient agrees to go over the instructions while at home for a better understanding. Patient also instructed to self quarantine after being tested for COVID-19. The opportunity to ask questions was provided.

## 2019-05-12 ENCOUNTER — Ambulatory Visit (INDEPENDENT_AMBULATORY_CARE_PROVIDER_SITE_OTHER): Payer: BC Managed Care – PPO | Admitting: Psychology

## 2019-05-12 ENCOUNTER — Telehealth: Payer: Self-pay | Admitting: Pulmonary Disease

## 2019-05-12 DIAGNOSIS — F319 Bipolar disorder, unspecified: Secondary | ICD-10-CM | POA: Diagnosis not present

## 2019-05-12 LAB — HEMOGLOBIN A1C
Hgb A1c MFr Bld: 5.9 % — ABNORMAL HIGH (ref 4.8–5.6)
Mean Plasma Glucose: 123 mg/dL

## 2019-05-12 MED ORDER — ALBUTEROL SULFATE HFA 108 (90 BASE) MCG/ACT IN AERS: 2.0000 | INHALATION_SPRAY | Freq: Four times a day (QID) | RESPIRATORY_TRACT | 1 refills | Status: DC | PRN

## 2019-05-12 NOTE — Telephone Encounter (Signed)
Can we send in Rx for albuterol inhaler? We see pt for OSA and the last Rx was given by Dr. Colin Benton. Please advise.

## 2019-05-12 NOTE — Progress Notes (Addendum)
Anesthesia Chart Review:  Follows with pulmonology for hx of obstructive sleep apnea, OHS, and mild intermittent asthma. Per last note 3/26 pt doing well, excellent compliance with CPAP. Discussed upcoming back surgery. Advised 42yr followup.  Follows with cardiology for hx of "mild cardiomyopathy". Last seen by Dr. Percival Spanish 08/18/18, doing well at that time. BP well controlled. Advised 67yr followup.  Pt reported her tongue was cut during intubation for a previous surgery. Video laryngoscope used for intubation during last surgery at [facility] per records 08/30/17.  Preop labs reviewed, unremarkable. DMII well controlled A1c 5.9  EKG 05/11/19: NSR. Rate 82.  TTE 05/06/15: - Left ventricle: The cavity size was normal. Wall thickness was  increased in a pattern of moderate LVH. Systolic function was  normal. The estimated ejection fraction was in the range of 50%  to 55%. Wall motion was normal; there were no regional wall  motion abnormalities. Features are consistent with a pseudonormal  left ventricular filling pattern, with concomitant abnormal  relaxation and increased filling pressure (grade 2 diastolic  dysfunction).  - Left atrium: The atrium was mildly dilated.  - Right ventricle: The cavity size was mildly dilated.  - Right atrium: The atrium was mildly dilated.  - Pulmonary arteries: Systolic pressure was moderately increased.  PA peak pressure: 45 mm Hg (S).   Nuclear stress 07/19/10: Overall Impression:  Small area of moderate anteroapical wall ischemia.  EF 42% Overall poor quality study.  Suggest further w/u given decreased EF. -Jenkins Rouge   This is a low risk scan.  The patient is at acceptable risk for a nerve block and finger surgery.  I am going to start low dose ARB for treatment of the low EF.  I do not feel that cath is indicated based on the lack of angina.  However, I will move her appt up to the next few weeks to discuss further.  -Alinda Money, PA-C [facility] Short Stay Center/Anesthesiology Phone [phone] 05/12/2019 1:38 PM

## 2019-05-12 NOTE — Telephone Encounter (Signed)
Spoke with pt and advised rx for albuterol sent to [facility] pharmacy. Nothing further is needed.

## 2019-05-12 NOTE — Telephone Encounter (Signed)
Yes , 1 refill

## 2019-05-12 NOTE — Anesthesia Preprocedure Evaluation (Addendum)
Anesthesia Evaluation  Patient identified by MRN, date of birth, ID band Patient awake    Reviewed: Allergy & Precautions, NPO status , Patient's Chart, lab work & pertinent test results  Airway Mallampati: III  TM Distance: >3 FB Neck ROM: Full    Dental  (+) Dental Advisory Given   Pulmonary asthma , sleep apnea and Continuous Positive Airway Pressure Ventilation ,    breath sounds clear to auscultation       Cardiovascular hypertension, Pt. on medications +CHF   Rhythm:Regular Rate:Normal  TTE 05/06/15: - Left ventricle: The cavity size was normal. Wall thickness was  increased in a pattern of moderate LVH. Systolic function was  normal. The estimated ejection fraction was in the range of 50%  to 55%. Wall motion was normal; there were no regional wall  motion abnormalities. Features are consistent with a pseudonormal  left ventricular filling pattern, with concomitant abnormal  relaxation and increased filling pressure (grade 2 diastolic  dysfunction).  - Left atrium: The atrium was mildly dilated.  - Right ventricle: The cavity size was mildly dilated.  - Right atrium: The atrium was mildly dilated.  - Pulmonary arteries: Systolic pressure was moderately increased.  PA peak pressure: 45 mm Hg (S).    Neuro/Psych Seizures -,  Cervical myelopathy  Neuromuscular disease    GI/Hepatic negative GI ROS, Neg liver ROS,   Endo/Other  diabetes, Type 2Morbid obesity  Renal/GU negative Renal ROS     Musculoskeletal  (+) Arthritis ,   Abdominal   Peds  Hematology negative hematology ROS (+)   Anesthesia Other Findings   Reproductive/Obstetrics                           Anesthesia Physical Anesthesia Plan  ASA: IV  Anesthesia Plan: General   Post-op Pain Management:    Induction: Intravenous  PONV Risk Score and Plan: 3 and Dexamethasone, Ondansetron and Treatment may  vary due to age or medical condition  Airway Management Planned: Oral ETT and Video Laryngoscope Planned  Additional Equipment: Arterial line  Intra-op Plan:   Post-operative Plan: Possible Post-op intubation/ventilation  Informed Consent: I have reviewed the patients History and Physical, chart, labs and discussed the procedure including the risks, benefits and alternatives for the proposed anesthesia with the patient or authorized representative who has indicated his/her understanding and acceptance.     Dental advisory given  Plan Discussed with: CRNA  Anesthesia Plan Comments: (Plan GETA. Aline, 2IV's. Video laryngoscopy. Lidocaine gtt, Ketamine bolus     Follows with pulmonology for hx of obstructive sleep apnea, OHS, and mild intermittent asthma. Per last note 3/26 pt doing well, excellent compliance with CPAP. Discussed upcoming back surgery. Advised 60yr followup.  Follows with cardiology for hx of "mild cardiomyopathy". Last seen by Dr. Percival Spanish 08/18/18, doing well at that time. BP well controlled. Advised 75yr followup.  Pt reported her tongue was cut during intubation for a previous surgery. Video laryngoscope used for intubation during last surgery at [facility] per records 08/30/17.  Preop labs reviewed, unremarkable. DMII well controlled A1c 5.9  EKG 05/11/19: NSR. Rate 82.  TTE 05/06/15: - Left ventricle: The cavity size was normal. Wall thickness was  increased in a pattern of moderate LVH. Systolic function was  normal. The estimated ejection fraction was in the range of 50%  to 55%. Wall motion was normal; there were no regional wall  motion abnormalities. Features are consistent with a pseudonormal  left  ventricular filling pattern, with concomitant abnormal  relaxation and increased filling pressure (grade 2 diastolic  dysfunction).  - Left atrium: The atrium was mildly dilated.  - Right ventricle: The cavity size was mildly dilated.  - Right atrium:  The atrium was mildly dilated.  - Pulmonary arteries: Systolic pressure was moderately increased.  PA peak pressure: 45 mm Hg (S).   Nuclear stress 07/19/10: Overall Impression:  Small area of moderate anteroapical wall ischemia.  EF 42% Overall poor quality study.  Suggest further w/u given decreased EF. -Jenkins Rouge   This is a low risk scan.  The patient is at acceptable risk for a nerve block and finger surgery.  I am going to start low dose ARB for treatment of the low EF.  I do not feel that cath is indicated based on the lack of angina.  However, I will move her appt up to the next few weeks to discuss further.  -Minus Breeding)     Anesthesia Quick Evaluation

## 2019-05-13 MED ORDER — DEXTROSE 5 % IV SOLN
3.0000 g | INTRAVENOUS | Status: AC
  Administered 2019-05-14: 08:00:00 3 g via INTRAVENOUS
  Filled 2019-05-13: qty 3

## 2019-05-14 ENCOUNTER — Inpatient Hospital Stay (HOSPITAL_COMMUNITY): Payer: BC Managed Care – PPO | Admitting: Physician Assistant

## 2019-05-14 ENCOUNTER — Other Ambulatory Visit: Payer: Self-pay

## 2019-05-14 ENCOUNTER — Inpatient Hospital Stay (HOSPITAL_COMMUNITY): Payer: BC Managed Care – PPO

## 2019-05-14 ENCOUNTER — Inpatient Hospital Stay (HOSPITAL_COMMUNITY)
Admission: RE | Admit: 2019-05-14 | Discharge: 2019-05-17 | DRG: 519 | Disposition: A | Discharge status: Home / Self Care | Payer: BC Managed Care – PPO | Attending: Neurosurgery | Admitting: Neurosurgery

## 2019-05-14 ENCOUNTER — Encounter (HOSPITAL_COMMUNITY)
Admission: RE | Disposition: A | Discharge status: Home / Self Care | Payer: Self-pay | Source: Home / Self Care | Attending: Neurosurgery

## 2019-05-14 ENCOUNTER — Inpatient Hospital Stay (HOSPITAL_COMMUNITY): Payer: BC Managed Care – PPO | Admitting: Anesthesiology

## 2019-05-14 DIAGNOSIS — M4682 Other specified inflammatory spondylopathies, cervical region: Secondary | ICD-10-CM | POA: Diagnosis not present

## 2019-05-14 DIAGNOSIS — Z6841 Body Mass Index (BMI) 40.0 and over, adult: Secondary | ICD-10-CM

## 2019-05-14 DIAGNOSIS — J45909 Unspecified asthma, uncomplicated: Secondary | ICD-10-CM | POA: Diagnosis not present

## 2019-05-14 DIAGNOSIS — G9741 Accidental puncture or laceration of dura during a procedure: Secondary | ICD-10-CM | POA: Diagnosis not present

## 2019-05-14 DIAGNOSIS — M488X2 Other specified spondylopathies, cervical region: Secondary | ICD-10-CM | POA: Diagnosis not present

## 2019-05-14 DIAGNOSIS — M4802 Spinal stenosis, cervical region: Secondary | ICD-10-CM | POA: Diagnosis present

## 2019-05-14 DIAGNOSIS — M5001 Cervical disc disorder with myelopathy,  high cervical region: Secondary | ICD-10-CM | POA: Diagnosis not present

## 2019-05-14 DIAGNOSIS — Z981 Arthrodesis status: Secondary | ICD-10-CM

## 2019-05-14 DIAGNOSIS — G959 Disease of spinal cord, unspecified: Secondary | ICD-10-CM | POA: Diagnosis present

## 2019-05-14 HISTORY — PX: POSTERIOR CERVICAL FUSION/FORAMINOTOMY: SHX5038

## 2019-05-14 HISTORY — PX: APPLICATION OF INTRAOPERATIVE CT SCAN: SHX6668

## 2019-05-14 LAB — POCT I-STAT, CHEM 8
BUN: 18 mg/dL (ref 6–20)
Calcium, Ion: 1.18 mmol/L (ref 1.15–1.40)
Chloride: 100 mmol/L (ref 98–111)
Creatinine, Ser: 1 mg/dL (ref 0.44–1.00)
Glucose, Bld: 117 mg/dL — ABNORMAL HIGH (ref 70–99)
HCT: 50 % — ABNORMAL HIGH (ref 36.0–46.0)
Hemoglobin: 17 g/dL — ABNORMAL HIGH (ref 12.0–15.0)
Potassium: 4.3 mmol/L (ref 3.5–5.1)
Sodium: 138 mmol/L (ref 135–145)
TCO2: 28 mmol/L (ref 22–32)

## 2019-05-14 LAB — POCT I-STAT 7, (LYTES, BLD GAS, ICA,H+H)
Acid-base deficit: 1 mmol/L (ref 0.0–2.0)
Bicarbonate: 27 mmol/L (ref 20.0–28.0)
Bicarbonate: 27.3 mmol/L (ref 20.0–28.0)
Calcium, Ion: 1.21 mmol/L (ref 1.15–1.40)
Calcium, Ion: 1.16 mmol/L (ref 1.15–1.40)
HCT: 45 % (ref 36.0–46.0)
HCT: 47 % — ABNORMAL HIGH (ref 36.0–46.0)
Hemoglobin: 15.3 g/dL — ABNORMAL HIGH (ref 12.0–15.0)
Hemoglobin: 16 g/dL — ABNORMAL HIGH (ref 12.0–15.0)
O2 Saturation: 92 %
O2 Saturation: 96 %
Potassium: 4.3 mmol/L (ref 3.5–5.1)
Potassium: 4.9 mmol/L (ref 3.5–5.1)
Sodium: 138 mmol/L (ref 135–145)
Sodium: 136 mmol/L (ref 135–145)
TCO2: 29 mmol/L (ref 22–32)
TCO2: 29 mmol/L (ref 22–32)
pCO2 arterial: 54.8 mmHg — ABNORMAL HIGH (ref 32.0–48.0)
pCO2 arterial: 52.6 mmHg — ABNORMAL HIGH (ref 32.0–48.0)
pH, Arterial: 7.3 — ABNORMAL LOW (ref 7.350–7.450)
pH, Arterial: 7.322 — ABNORMAL LOW (ref 7.350–7.450)
pO2, Arterial: 73 mmHg — ABNORMAL LOW (ref 83.0–108.0)
pO2, Arterial: 93 mmHg (ref 83.0–108.0)

## 2019-05-14 LAB — GLUCOSE, CAPILLARY
Glucose-Capillary: 128 mg/dL — ABNORMAL HIGH (ref 70–99)
Glucose-Capillary: 167 mg/dL — ABNORMAL HIGH (ref 70–99)

## 2019-05-14 LAB — POCT PREGNANCY, URINE: Preg Test, Ur: NEGATIVE

## 2019-05-14 SURGERY — POSTERIOR CERVICAL FUSION/FORAMINOTOMY LEVEL 4
Anesthesia: General | Site: Neck

## 2019-05-14 MED ORDER — AMPHETAMINE-DEXTROAMPHETAMINE 10 MG PO TABS
20.0000 mg | ORAL_TABLET | Freq: Three times a day (TID) | ORAL | Status: DC
  Administered 2019-05-14 – 2019-05-16 (×4): 20 mg via ORAL
  Filled 2019-05-14 (×4): qty 2

## 2019-05-14 MED ORDER — ROCURONIUM BROMIDE 50 MG/5ML IV SOSY
PREFILLED_SYRINGE | INTRAVENOUS | Status: DC | PRN
  Administered 2019-05-14 (×3): 50 mg via INTRAVENOUS
  Administered 2019-05-14: 20 mg via INTRAVENOUS
  Administered 2019-05-14: 50 mg via INTRAVENOUS

## 2019-05-14 MED ORDER — MIDAZOLAM HCL 5 MG/5ML IJ SOLN
INTRAMUSCULAR | Status: DC | PRN
  Administered 2019-05-14: 2 mg via INTRAVENOUS

## 2019-05-14 MED ORDER — ACETAMINOPHEN 500 MG PO TABS: 1000.0000 mg | ORAL_TABLET | Freq: Four times a day (QID) | ORAL | Status: DC | PRN

## 2019-05-14 MED ORDER — METHOCARBAMOL 500 MG PO TABS
500.0000 mg | ORAL_TABLET | Freq: Four times a day (QID) | ORAL | Status: DC | PRN
  Administered 2019-05-14 – 2019-05-17 (×9): 500 mg via ORAL
  Filled 2019-05-14 (×9): qty 1

## 2019-05-14 MED ORDER — ASCORBIC ACID 500 MG PO TABS
1000.0000 mg | ORAL_TABLET | Freq: Every day | ORAL | Status: DC
  Administered 2019-05-15 – 2019-05-16 (×2): 1000 mg via ORAL
  Filled 2019-05-14 (×2): qty 2

## 2019-05-14 MED ORDER — BUPIVACAINE HCL (PF) 0.5 % IJ SOLN
INTRAMUSCULAR | Status: AC
  Filled 2019-05-14: qty 30

## 2019-05-14 MED ORDER — HYDROMORPHONE HCL 1 MG/ML IJ SOLN
0.2500 mg | INTRAMUSCULAR | Status: DC | PRN
  Administered 2019-05-14 (×2): 0.25 mg via INTRAVENOUS
  Administered 2019-05-14 (×2): 0.5 mg via INTRAVENOUS

## 2019-05-14 MED ORDER — CHROMIUM PICOLINATE 500 MCG PO TABS: 500.0000 ug | ORAL_TABLET | Freq: Every day | ORAL | Status: DC

## 2019-05-14 MED ORDER — THROMBIN 20000 UNITS EX SOLR: CUTANEOUS | Status: DC | PRN

## 2019-05-14 MED ORDER — ASPIRIN 81 MG PO TABS: 81.0000 mg | ORAL_TABLET | Freq: Every day | ORAL | Status: DC

## 2019-05-14 MED ORDER — BUPIVACAINE HCL (PF) 0.5 % IJ SOLN
INTRAMUSCULAR | Status: DC | PRN
  Administered 2019-05-14: 10 mL

## 2019-05-14 MED ORDER — OXYCODONE HCL 5 MG PO TABS
5.0000 mg | ORAL_TABLET | ORAL | Status: DC | PRN
  Administered 2019-05-15 – 2019-05-16 (×6): 5 mg via ORAL
  Filled 2019-05-14 (×6): qty 1

## 2019-05-14 MED ORDER — FENTANYL CITRATE (PF) 250 MCG/5ML IJ SOLN
INTRAMUSCULAR | Status: AC
  Filled 2019-05-14: qty 5

## 2019-05-14 MED ORDER — THROMBIN 5000 UNITS EX SOLR
CUTANEOUS | Status: AC
  Filled 2019-05-14: qty 10000

## 2019-05-14 MED ORDER — CHLORHEXIDINE GLUCONATE CLOTH 2 % EX PADS: 6.0000 | MEDICATED_PAD | Freq: Once | CUTANEOUS | Status: DC

## 2019-05-14 MED ORDER — HEMOSTATIC AGENTS (NO CHARGE) OPTIME
TOPICAL | Status: DC | PRN
  Administered 2019-05-14: 1 via TOPICAL

## 2019-05-14 MED ORDER — NORETHIN ACE-ETH ESTRAD-FE 1.5-30 MG-MCG PO TABS: 1.0000 | ORAL_TABLET | Freq: Every day | ORAL | Status: DC

## 2019-05-14 MED ORDER — FUROSEMIDE 80 MG PO TABS
80.0000 mg | ORAL_TABLET | Freq: Two times a day (BID) | ORAL | Status: DC
  Administered 2019-05-15 – 2019-05-16 (×4): 80 mg via ORAL
  Filled 2019-05-14 (×4): qty 1

## 2019-05-14 MED ORDER — FLEET ENEMA 7-19 GM/118ML RE ENEM: 1.0000 | ENEMA | Freq: Once | RECTAL | Status: DC | PRN

## 2019-05-14 MED ORDER — POLYETHYLENE GLYCOL 3350 17 G PO PACK: 17.0000 g | PACK | Freq: Every day | ORAL | Status: DC | PRN

## 2019-05-14 MED ORDER — THROMBIN 20000 UNITS EX SOLR
CUTANEOUS | Status: AC
  Filled 2019-05-14: qty 20000

## 2019-05-14 MED ORDER — ALBUMIN HUMAN 5 % IV SOLN: INTRAVENOUS | Status: DC | PRN

## 2019-05-14 MED ORDER — METFORMIN HCL 500 MG PO TABS
500.0000 mg | ORAL_TABLET | Freq: Two times a day (BID) | ORAL | Status: DC
  Administered 2019-05-15 – 2019-05-16 (×4): 500 mg via ORAL
  Filled 2019-05-14 (×4): qty 1

## 2019-05-14 MED ORDER — ZOLPIDEM TARTRATE 5 MG PO TABS: 5.0000 mg | ORAL_TABLET | Freq: Every evening | ORAL | Status: DC | PRN

## 2019-05-14 MED ORDER — ACETAMINOPHEN 650 MG RE SUPP: 650.0000 mg | RECTAL | Status: DC | PRN

## 2019-05-14 MED ORDER — MIDAZOLAM HCL 2 MG/2ML IJ SOLN
INTRAMUSCULAR | Status: AC
  Filled 2019-05-14: qty 2

## 2019-05-14 MED ORDER — LACTATED RINGERS IV SOLN: INTRAVENOUS | Status: DC | PRN

## 2019-05-14 MED ORDER — PREGABALIN 100 MG PO CAPS
300.0000 mg | ORAL_CAPSULE | Freq: Two times a day (BID) | ORAL | Status: DC
  Administered 2019-05-14 – 2019-05-16 (×5): 300 mg via ORAL
  Filled 2019-05-14 (×5): qty 3

## 2019-05-14 MED ORDER — ATORVASTATIN CALCIUM 10 MG PO TABS
20.0000 mg | ORAL_TABLET | Freq: Every evening | ORAL | Status: DC
  Administered 2019-05-15 – 2019-05-16 (×2): 20 mg via ORAL
  Filled 2019-05-14 (×2): qty 2

## 2019-05-14 MED ORDER — THROMBIN 5000 UNITS EX SOLR: OROMUCOSAL | Status: DC | PRN

## 2019-05-14 MED ORDER — 0.9 % SODIUM CHLORIDE (POUR BTL) OPTIME
TOPICAL | Status: DC | PRN
  Administered 2019-05-14: 1000 mL

## 2019-05-14 MED ORDER — CARIPRAZINE HCL 6 MG PO CAPS: 6.0000 mg | ORAL_CAPSULE | Freq: Every evening | ORAL | Status: DC

## 2019-05-14 MED ORDER — ACETAMINOPHEN 500 MG PO TABS
1000.0000 mg | ORAL_TABLET | Freq: Once | ORAL | Status: DC
  Filled 2019-05-14: qty 2

## 2019-05-14 MED ORDER — ALPRAZOLAM 0.5 MG PO TABS
1.0000 mg | ORAL_TABLET | Freq: Three times a day (TID) | ORAL | Status: DC
  Administered 2019-05-14 – 2019-05-16 (×7): 1 mg via ORAL
  Filled 2019-05-14 (×7): qty 2

## 2019-05-14 MED ORDER — CEFAZOLIN SODIUM-DEXTROSE 2-4 GM/100ML-% IV SOLN
2.0000 g | Freq: Three times a day (TID) | INTRAVENOUS | Status: AC
  Administered 2019-05-15: 02:00:00 2 g via INTRAVENOUS
  Filled 2019-05-14 (×2): qty 100

## 2019-05-14 MED ORDER — HYDROMORPHONE HCL 1 MG/ML IJ SOLN
INTRAMUSCULAR | Status: AC
  Filled 2019-05-14: qty 0.5

## 2019-05-14 MED ORDER — ALBUTEROL SULFATE (2.5 MG/3ML) 0.083% IN NEBU: 3.0000 mL | INHALATION_SOLUTION | Freq: Four times a day (QID) | RESPIRATORY_TRACT | Status: DC | PRN

## 2019-05-14 MED ORDER — KCL IN DEXTROSE-NACL 20-5-0.45 MEQ/L-%-% IV SOLN: INTRAVENOUS | Status: DC

## 2019-05-14 MED ORDER — HYDROMORPHONE HCL 1 MG/ML IJ SOLN
INTRAMUSCULAR | Status: AC
  Filled 2019-05-14: qty 1

## 2019-05-14 MED ORDER — LIDOCAINE 2% (20 MG/ML) 5 ML SYRINGE
INTRAMUSCULAR | Status: AC
  Filled 2019-05-14: qty 5

## 2019-05-14 MED ORDER — PROPOFOL 10 MG/ML IV BOLUS
INTRAVENOUS | Status: AC
  Filled 2019-05-14: qty 40

## 2019-05-14 MED ORDER — DEXAMETHASONE SODIUM PHOSPHATE 10 MG/ML IJ SOLN
INTRAMUSCULAR | Status: AC
  Filled 2019-05-14: qty 1

## 2019-05-14 MED ORDER — BACITRACIN ZINC 500 UNIT/GM EX OINT
TOPICAL_OINTMENT | CUTANEOUS | Status: DC | PRN
  Administered 2019-05-14 (×2): 1 via TOPICAL

## 2019-05-14 MED ORDER — ARTIFICIAL TEARS OPHTHALMIC OINT
TOPICAL_OINTMENT | OPHTHALMIC | Status: DC | PRN
  Administered 2019-05-14: 1 via OPHTHALMIC

## 2019-05-14 MED ORDER — BISACODYL 10 MG RE SUPP: 10.0000 mg | Freq: Every day | RECTAL | Status: DC | PRN

## 2019-05-14 MED ORDER — METHOCARBAMOL 1000 MG/10ML IJ SOLN
500.0000 mg | Freq: Four times a day (QID) | INTRAVENOUS | Status: DC | PRN
  Filled 2019-05-14: qty 5

## 2019-05-14 MED ORDER — ADULT MULTIVITAMIN W/MINERALS CH
1.0000 | ORAL_TABLET | Freq: Every day | ORAL | Status: DC
  Administered 2019-05-15 – 2019-05-16 (×2): 1 via ORAL
  Filled 2019-05-14 (×2): qty 1

## 2019-05-14 MED ORDER — POTASSIUM CHLORIDE CRYS ER 20 MEQ PO TBCR
20.0000 meq | EXTENDED_RELEASE_TABLET | Freq: Every day | ORAL | Status: DC
  Administered 2019-05-15 – 2019-05-16 (×2): 20 meq via ORAL
  Filled 2019-05-14 (×2): qty 1

## 2019-05-14 MED ORDER — DICLOFENAC SODIUM 1 % EX GEL: 2.0000 g | Freq: Three times a day (TID) | CUTANEOUS | Status: DC | PRN

## 2019-05-14 MED ORDER — SUCCINYLCHOLINE CHLORIDE 200 MG/10ML IV SOSY
PREFILLED_SYRINGE | INTRAVENOUS | Status: AC
  Filled 2019-05-14: qty 10

## 2019-05-14 MED ORDER — KETAMINE HCL 10 MG/ML IJ SOLN
INTRAMUSCULAR | Status: DC | PRN
  Administered 2019-05-14 (×2): 20 mg via INTRAVENOUS
  Administered 2019-05-14: 40 mg via INTRAVENOUS

## 2019-05-14 MED ORDER — ONDANSETRON HCL 4 MG/2ML IJ SOLN: 4.0000 mg | Freq: Once | INTRAMUSCULAR | Status: DC | PRN

## 2019-05-14 MED ORDER — ONDANSETRON HCL 4 MG/2ML IJ SOLN: 4.0000 mg | Freq: Four times a day (QID) | INTRAMUSCULAR | Status: DC | PRN

## 2019-05-14 MED ORDER — SODIUM CHLORIDE 0.9% FLUSH
3.0000 mL | Freq: Two times a day (BID) | INTRAVENOUS | Status: DC
  Administered 2019-05-14 – 2019-05-16 (×5): 3 mL via INTRAVENOUS

## 2019-05-14 MED ORDER — ONDANSETRON HCL 4 MG PO TABS: 4.0000 mg | ORAL_TABLET | Freq: Four times a day (QID) | ORAL | Status: DC | PRN

## 2019-05-14 MED ORDER — LIDOCAINE-EPINEPHRINE 1 %-1:100000 IJ SOLN
INTRAMUSCULAR | Status: DC | PRN
  Administered 2019-05-14: 10 mL

## 2019-05-14 MED ORDER — CLONAZEPAM 1 MG PO TABS
1.0000 mg | ORAL_TABLET | Freq: Three times a day (TID) | ORAL | Status: DC | PRN
  Administered 2019-05-14 – 2019-05-15 (×2): 1 mg via ORAL
  Filled 2019-05-14 (×2): qty 1

## 2019-05-14 MED ORDER — ALUM & MAG HYDROXIDE-SIMETH 200-200-20 MG/5ML PO SUSP: 30.0000 mL | Freq: Four times a day (QID) | ORAL | Status: DC | PRN

## 2019-05-14 MED ORDER — LIDOCAINE-EPINEPHRINE 1 %-1:100000 IJ SOLN
INTRAMUSCULAR | Status: AC
  Filled 2019-05-14: qty 1

## 2019-05-14 MED ORDER — ONDANSETRON HCL 4 MG/2ML IJ SOLN
INTRAMUSCULAR | Status: AC
  Filled 2019-05-14: qty 2

## 2019-05-14 MED ORDER — B COMPLEX PO TABS: 1.0000 | ORAL_TABLET | Freq: Every day | ORAL | Status: DC

## 2019-05-14 MED ORDER — DOCUSATE SODIUM 100 MG PO CAPS
100.0000 mg | ORAL_CAPSULE | Freq: Two times a day (BID) | ORAL | Status: DC
  Administered 2019-05-14 – 2019-05-16 (×5): 100 mg via ORAL
  Filled 2019-05-14 (×5): qty 1

## 2019-05-14 MED ORDER — LIDOCAINE 5 % EX PTCH: 1.0000 | MEDICATED_PATCH | Freq: Every day | CUTANEOUS | Status: DC | PRN

## 2019-05-14 MED ORDER — LIDOCAINE IN D5W 4-5 MG/ML-% IV SOLN
1.0000 mg/min | INTRAVENOUS | Status: AC
  Administered 2019-05-14: 08:00:00 25 ug/kg/min via INTRAVENOUS
  Filled 2019-05-14: qty 500

## 2019-05-14 MED ORDER — LABETALOL HCL 5 MG/ML IV SOLN
INTRAVENOUS | Status: DC | PRN
  Administered 2019-05-14 (×4): 5 mg via INTRAVENOUS

## 2019-05-14 MED ORDER — PHENYLEPHRINE HCL-NACL 10-0.9 MG/250ML-% IV SOLN
INTRAVENOUS | Status: DC | PRN
  Administered 2019-05-14: 30 ug/min via INTRAVENOUS

## 2019-05-14 MED ORDER — PROPOFOL 10 MG/ML IV BOLUS
INTRAVENOUS | Status: DC | PRN
  Administered 2019-05-14: 200 mg via INTRAVENOUS

## 2019-05-14 MED ORDER — FENTANYL CITRATE (PF) 250 MCG/5ML IJ SOLN
INTRAMUSCULAR | Status: DC | PRN
  Administered 2019-05-14: 50 ug via INTRAVENOUS
  Administered 2019-05-14: 100 ug via INTRAVENOUS
  Administered 2019-05-14 (×3): 50 ug via INTRAVENOUS

## 2019-05-14 MED ORDER — SODIUM CHLORIDE 0.9 % IV SOLN: 250.0000 mL | INTRAVENOUS | Status: DC

## 2019-05-14 MED ORDER — LAMOTRIGINE 100 MG PO TABS
200.0000 mg | ORAL_TABLET | Freq: Two times a day (BID) | ORAL | Status: DC
  Administered 2019-05-14 – 2019-05-16 (×5): 200 mg via ORAL
  Filled 2019-05-14 (×5): qty 2

## 2019-05-14 MED ORDER — DEXAMETHASONE SODIUM PHOSPHATE 10 MG/ML IJ SOLN
INTRAMUSCULAR | Status: DC | PRN
  Administered 2019-05-14: 10 mg via INTRAVENOUS

## 2019-05-14 MED ORDER — OMEGA-3-ACID ETHYL ESTERS 1 G PO CAPS: 1.0000 g | ORAL_CAPSULE | Freq: Every day | ORAL | Status: DC

## 2019-05-14 MED ORDER — PHENOL 1.4 % MT LIQD: 1.0000 | OROMUCOSAL | Status: DC | PRN

## 2019-05-14 MED ORDER — HYDROMORPHONE HCL 1 MG/ML IJ SOLN
INTRAMUSCULAR | Status: DC | PRN
  Administered 2019-05-14 (×2): .5 mg via INTRAVENOUS

## 2019-05-14 MED ORDER — DEXTROMETHORPHAN-QUINIDINE 20-10 MG PO CAPS
1.0000 | ORAL_CAPSULE | Freq: Two times a day (BID) | ORAL | Status: DC
  Administered 2019-05-15 – 2019-05-16 (×4): 1 via ORAL
  Filled 2019-05-14 (×5): qty 1

## 2019-05-14 MED ORDER — SODIUM CHLORIDE 0.9% FLUSH: 3.0000 mL | INTRAVENOUS | Status: DC | PRN

## 2019-05-14 MED ORDER — HYDROMORPHONE HCL 1 MG/ML IJ SOLN
0.5000 mg | INTRAMUSCULAR | Status: DC | PRN
  Administered 2019-05-14 – 2019-05-15 (×2): 0.5 mg via INTRAVENOUS
  Filled 2019-05-14 (×2): qty 1

## 2019-05-14 MED ORDER — DEXAMETHASONE 4 MG PO TABS: 4.0000 mg | ORAL_TABLET | Freq: Four times a day (QID) | ORAL | Status: DC

## 2019-05-14 MED ORDER — MENTHOL 3 MG MT LOZG: 1.0000 | LOZENGE | OROMUCOSAL | Status: DC | PRN

## 2019-05-14 MED ORDER — SUGAMMADEX SODIUM 200 MG/2ML IV SOLN
INTRAVENOUS | Status: DC | PRN
  Administered 2019-05-14: 500 mg via INTRAVENOUS

## 2019-05-14 MED ORDER — TRAMADOL HCL 50 MG PO TABS: 50.0000 mg | ORAL_TABLET | Freq: Three times a day (TID) | ORAL | Status: DC | PRN

## 2019-05-14 MED ORDER — DEXAMETHASONE SODIUM PHOSPHATE 4 MG/ML IJ SOLN
4.0000 mg | Freq: Four times a day (QID) | INTRAMUSCULAR | Status: DC
  Administered 2019-05-14 – 2019-05-15 (×2): 4 mg via INTRAVENOUS
  Filled 2019-05-14 (×2): qty 1

## 2019-05-14 MED ORDER — HYDROCODONE-ACETAMINOPHEN 5-325 MG PO TABS
2.0000 | ORAL_TABLET | ORAL | Status: DC | PRN
  Administered 2019-05-15 – 2019-05-17 (×6): 2 via ORAL
  Filled 2019-05-14 (×6): qty 2

## 2019-05-14 MED ORDER — KETAMINE HCL 50 MG/5ML IJ SOSY
PREFILLED_SYRINGE | INTRAMUSCULAR | Status: AC
  Filled 2019-05-14: qty 10

## 2019-05-14 MED ORDER — ONDANSETRON HCL 4 MG/2ML IJ SOLN
INTRAMUSCULAR | Status: DC | PRN
  Administered 2019-05-14: 4 mg via INTRAVENOUS

## 2019-05-14 MED ORDER — ZINC 50 MG PO CAPS: 50.0000 mg | ORAL_CAPSULE | Freq: Every day | ORAL | Status: DC

## 2019-05-14 MED ORDER — BACITRACIN ZINC 500 UNIT/GM EX OINT
TOPICAL_OINTMENT | CUTANEOUS | Status: AC
  Filled 2019-05-14: qty 28.35

## 2019-05-14 MED ORDER — MAGNESIUM OXIDE 400 (241.3 MG) MG PO TABS
400.0000 mg | ORAL_TABLET | Freq: Every day | ORAL | Status: DC
  Administered 2019-05-15 – 2019-05-16 (×2): 400 mg via ORAL
  Filled 2019-05-14 (×2): qty 1

## 2019-05-14 MED ORDER — PANTOPRAZOLE SODIUM 40 MG IV SOLR
40.0000 mg | Freq: Every day | INTRAVENOUS | Status: DC
  Administered 2019-05-14: 40 mg via INTRAVENOUS
  Filled 2019-05-14: qty 40

## 2019-05-14 MED ORDER — LOSARTAN POTASSIUM 25 MG PO TABS
25.0000 mg | ORAL_TABLET | Freq: Two times a day (BID) | ORAL | Status: DC
  Administered 2019-05-14 – 2019-05-16 (×4): 25 mg via ORAL
  Filled 2019-05-14 (×6): qty 1

## 2019-05-14 MED ORDER — LIDOCAINE 2% (20 MG/ML) 5 ML SYRINGE
INTRAMUSCULAR | Status: DC | PRN
  Administered 2019-05-14: 60 mg via INTRAVENOUS

## 2019-05-14 MED ORDER — VITAMIN D3 125 MCG (5000 UT) PO TABS: 10000.0000 [IU] | ORAL_TABLET | Freq: Two times a day (BID) | ORAL | Status: DC

## 2019-05-14 MED ORDER — EPHEDRINE SULFATE-NACL 50-0.9 MG/10ML-% IV SOSY
PREFILLED_SYRINGE | INTRAVENOUS | Status: DC | PRN
  Administered 2019-05-14: 5 mg via INTRAVENOUS
  Administered 2019-05-14: 10 mg via INTRAVENOUS

## 2019-05-14 MED ORDER — ACETAMINOPHEN 325 MG PO TABS
650.0000 mg | ORAL_TABLET | ORAL | Status: DC | PRN
  Administered 2019-05-15: 650 mg via ORAL
  Filled 2019-05-14 (×2): qty 2

## 2019-05-14 SURGICAL SUPPLY — 86 items
ATTRACTOMAT 16X20 MAGNETIC DRP (DRAPES) ×2 IMPLANT
BIT DRILL NEURO 2X3.1 SFT TUCH (MISCELLANEOUS) IMPLANT
BLADE CLIPPER SURG (BLADE) ×2 IMPLANT
BLADE SURG 11 STRL SS (BLADE) IMPLANT
BLADE ULTRA TIP 2M (BLADE) IMPLANT
BUR PRECISION FLUTE 5.0 (BURR) ×2 IMPLANT
CANISTER SUCT 3000ML PPV (MISCELLANEOUS) ×4 IMPLANT
CARTRIDGE OIL MAESTRO DRILL (MISCELLANEOUS) ×4 IMPLANT
CNTNR SPEC C3OZ STD GRAD LEK (MISCELLANEOUS) IMPLANT
CONT SPEC 3OZ W/LID STRL (MISCELLANEOUS) ×4
COVER BACK TABLE 60X90IN (DRAPES) ×6 IMPLANT
COVER WAND RF STERILE (DRAPES) ×4 IMPLANT
DECANTER SPIKE VIAL GLASS SM (MISCELLANEOUS) ×4 IMPLANT
DIFFUSER DRILL AIR PNEUMATIC (MISCELLANEOUS) ×6 IMPLANT
DRAPE C-ARM 42X72 X-RAY (DRAPES) ×4 IMPLANT
DRAPE LAPAROTOMY 100X72 PEDS (DRAPES) ×4 IMPLANT
DRAPE MICROSCOPE LEICA (MISCELLANEOUS) IMPLANT
DRAPE SCAN PATIENT (DRAPES) ×6 IMPLANT
DRILL NEURO 2X3.1 SOFT TOUCH (MISCELLANEOUS) ×4
DRSG OPSITE POSTOP 4X10 (GAUZE/BANDAGES/DRESSINGS) ×2 IMPLANT
DRSG PAD ABDOMINAL 8X10 ST (GAUZE/BANDAGES/DRESSINGS) ×4 IMPLANT
DURAPREP 26ML APPLICATOR (WOUND CARE) ×2 IMPLANT
DURAPREP 6ML APPLICATOR 50/CS (WOUND CARE) ×2 IMPLANT
ELECT REM PT RETURN 9FT ADLT (ELECTROSURGICAL) ×4
ELECTRODE REM PT RTRN 9FT ADLT (ELECTROSURGICAL) ×2 IMPLANT
EVACUATOR 1/8 PVC DRAIN (DRAIN) ×2 IMPLANT
GAUZE 4X4 16PLY RFD (DISPOSABLE) ×2 IMPLANT
GAUZE SPONGE 4X4 12PLY STRL (GAUZE/BANDAGES/DRESSINGS) ×2 IMPLANT
GLOVE BIO SURGEON STRL SZ8 (GLOVE) ×8 IMPLANT
GLOVE BIOGEL PI IND STRL 6.5 (GLOVE) IMPLANT
GLOVE BIOGEL PI IND STRL 8 (GLOVE) ×2 IMPLANT
GLOVE BIOGEL PI IND STRL 8.5 (GLOVE) ×2 IMPLANT
GLOVE BIOGEL PI INDICATOR 6.5 (GLOVE) ×12
GLOVE BIOGEL PI INDICATOR 8 (GLOVE) ×18
GLOVE BIOGEL PI INDICATOR 8.5 (GLOVE) ×6
GLOVE ECLIPSE 6.5 STRL STRAW (GLOVE) ×2 IMPLANT
GLOVE ECLIPSE 7.5 STRL STRAW (GLOVE) ×8 IMPLANT
GLOVE ECLIPSE 8.0 STRL XLNG CF (GLOVE) ×8 IMPLANT
GLOVE EXAM NITRILE XL STR (GLOVE) IMPLANT
GOWN STRL REUS W/ TWL LRG LVL3 (GOWN DISPOSABLE) IMPLANT
GOWN STRL REUS W/ TWL XL LVL3 (GOWN DISPOSABLE) IMPLANT
GOWN STRL REUS W/TWL 2XL LVL3 (GOWN DISPOSABLE) ×10 IMPLANT
GOWN STRL REUS W/TWL LRG LVL3 (GOWN DISPOSABLE) ×16
GOWN STRL REUS W/TWL XL LVL3 (GOWN DISPOSABLE) ×12
GRAFT DURAGEN MATRIX 1WX1L (Tissue) ×2 IMPLANT
HEMOSTAT POWDER KIT SURGIFOAM (HEMOSTASIS) ×4 IMPLANT
HEMOSTAT SURGICEL 2X14 (HEMOSTASIS) IMPLANT
KIT BASIN OR (CUSTOM PROCEDURE TRAY) ×4 IMPLANT
KIT TURNOVER KIT B (KITS) ×4 IMPLANT
MARKER SKIN DUAL TIP RULER LAB (MISCELLANEOUS) ×4 IMPLANT
MARKER SPHERE PSV REFLC 13MM (MARKER) ×26 IMPLANT
NDL HYPO 18GX1.5 BLUNT FILL (NEEDLE) IMPLANT
NDL HYPO 25X1 1.5 SAFETY (NEEDLE) ×2 IMPLANT
NDL SPNL 22GX3.5 QUINCKE BK (NEEDLE) ×2 IMPLANT
NEEDLE HYPO 18GX1.5 BLUNT FILL (NEEDLE) IMPLANT
NEEDLE HYPO 25X1 1.5 SAFETY (NEEDLE) ×4 IMPLANT
NEEDLE SPNL 22GX3.5 QUINCKE BK (NEEDLE) IMPLANT
NS IRRIG 1000ML POUR BTL (IV SOLUTION) ×4 IMPLANT
OIL CARTRIDGE MAESTRO DRILL (MISCELLANEOUS) ×4
PACK LAMINECTOMY NEURO (CUSTOM PROCEDURE TRAY) ×4 IMPLANT
PATTIES SURGICAL .5 X3 (DISPOSABLE) ×2 IMPLANT
PATTIES SURGICAL 1X1 (DISPOSABLE) ×2 IMPLANT
PIN MAYFIELD SKULL DISP (PIN) ×4 IMPLANT
ROD 120MM (Rod) ×8 IMPLANT
ROD SPNL 120X3.5XNS LF TI (Rod) IMPLANT
RUBBERBAND STERILE (MISCELLANEOUS) IMPLANT
SCREW MA MM 3.5X12 (Screw) ×10 IMPLANT
SCREW MA MM 3.5X14 (Screw) ×10 IMPLANT
SCREW SET THREADED (Screw) ×20 IMPLANT
SEALANT ADHERUS EXTEND TIP (MISCELLANEOUS) ×2 IMPLANT
SPONGE INTESTINAL PEANUT (DISPOSABLE) IMPLANT
SPONGE SURGIFOAM ABS GEL 100 (HEMOSTASIS) ×2 IMPLANT
SPONGE SURGIFOAM ABS GEL SZ50 (HEMOSTASIS) ×2 IMPLANT
STAPLER SKIN PROX WIDE 3.9 (STAPLE) ×4 IMPLANT
SUT ETHILON 3 0 FSL (SUTURE) ×2 IMPLANT
SUT VIC AB 0 CT1 18XCR BRD8 (SUTURE) ×2 IMPLANT
SUT VIC AB 0 CT1 8-18 (SUTURE) ×8
SUT VIC AB 2-0 CP2 18 (SUTURE) ×6 IMPLANT
SUT VIC AB 2-0 CT1 18 (SUTURE) ×6 IMPLANT
SUT VIC AB 3-0 SH 8-18 (SUTURE) ×4 IMPLANT
SYR 3ML LL SCALE MARK (SYRINGE) IMPLANT
TOWEL GREEN STERILE (TOWEL DISPOSABLE) ×4 IMPLANT
TOWEL GREEN STERILE FF (TOWEL DISPOSABLE) ×4 IMPLANT
TRAY FOLEY MTR SLVR 16FR STAT (SET/KITS/TRAYS/PACK) ×2 IMPLANT
UNDERPAD 30X30 (UNDERPADS AND DIAPERS) ×2 IMPLANT
WATER STERILE IRR 1000ML POUR (IV SOLUTION) ×4 IMPLANT

## 2019-05-14 NOTE — Anesthesia Procedure Notes (Deleted)
Arterial Line Insertion Start/End4/15/2021 7:00 AM, 05/14/2019 7:15 AM Performed by: Suzette Battiest, MD, anesthesiologist  Patient location: Pre-op. Preanesthetic checklist: patient identified, IV checked, site marked, risks and benefits discussed, surgical consent, monitors and equipment checked, pre-op evaluation, timeout performed and anesthesia consent Lidocaine 1% used for infiltration radial was placed Catheter size: 20 Fr Hand hygiene performed  and maximum sterile barriers used   Attempts: 1 Procedure performed using ultrasound guided technique. Ultrasound Notes:anatomy identified, needle tip was noted to be adjacent to the nerve/plexus identified and no ultrasound evidence of intravascular and/or intraneural injection Following insertion, dressing applied and Biopatch. Post procedure assessment: normal and unchanged  Patient tolerated the procedure well with no immediate complications.

## 2019-05-14 NOTE — Anesthesia Procedure Notes (Signed)
Arterial Line Insertion Start/End4/15/2021 7:20 AM, 05/14/2019 7:28 AM Performed by: Suzette Battiest, MD  Patient location: Pre-op. Preanesthetic checklist: patient identified, IV checked, site marked, risks and benefits discussed, surgical consent, monitors and equipment checked, pre-op evaluation, timeout performed and anesthesia consent Lidocaine 1% used for infiltration Left, radial was placed Catheter size: 20 Fr Hand hygiene performed  and maximum sterile barriers used   Attempts: 1 Procedure performed using ultrasound guided technique. Ultrasound Notes:anatomy identified and image(s) printed for medical record Following insertion, dressing applied and Biopatch. Post procedure assessment: normal and unchanged  Patient tolerated the procedure well with no immediate complications.

## 2019-05-14 NOTE — Anesthesia Procedure Notes (Signed)
Procedure Name: Intubation Date/Time: 05/14/2019 7:51 AM Performed by: Griffin Dakin, CRNA Pre-anesthesia Checklist: Patient identified, Emergency Drugs available, Suction available and Patient being monitored Patient Re-evaluated:Patient Re-evaluated prior to induction Oxygen Delivery Method: Circle system utilized Preoxygenation: Pre-oxygenation with 100% oxygen Induction Type: IV induction Ventilation: Mask ventilation without difficulty Laryngoscope Size: Glidescope and 4 Grade View: Grade I Tube type: Oral Tube size: 7.0 mm Number of attempts: 1 Airway Equipment and Method: Video-laryngoscopy and Rigid stylet Placement Confirmation: ETT inserted through vocal cords under direct vision,  positive ETCO2 and breath sounds checked- equal and bilateral Secured at: 22 cm Tube secured with: Tape Dental Injury: Teeth and Oropharynx as per pre-operative assessment

## 2019-05-14 NOTE — Op Note (Addendum)
05/14/2019  12:32 PM  PATIENT:  [name]  49 y.o. female  PRE-OPERATIVE DIAGNOSIS:  Cervical stenosis of spinal canal, cervical myelopathy, morbid obesity, ossification of posterior longitudinal ligament, cervicalgia  POST-OPERATIVE DIAGNOSIS:  Cervical stenosis of spinal canal, cervical myelopathy, morbid obesity, ossification of posterior longitudinal ligament, cervicalgia   PROCEDURE:  Procedure(s): Posterior cervical decompression/fusion Cervical four to Thoracic one with exploration/revision of Cervical threee-four fusion (N/A) APPLICATION OF INTRAOPERATIVE CT SCAN (N/A) with posterior decompression and fusion from C  3 - T 1 levels.  SURGEON:  Surgeon(s) and Role:    * Erline Levine, MD - Primary    * Ashok Pall, MD - Assisting  PHYSICIAN ASSISTANT:   ASSISTANTS: Poteat, RN   ANESTHESIA:   general  EBL:  200 mL   BLOOD ADMINISTERED:none  DRAINS: (Medium) Hemovact drain(s) in the epidural space with  Suction Open   LOCAL MEDICATIONS USED:  MARCAINE    and LIDOCAINE   SPECIMEN:  No Specimen  DISPOSITION OF SPECIMEN:  N/A  COUNTS:  YES  TOURNIQUET:  * No tourniquets in log *  DICTATION: Patient is 49 year old woman with ossification of posterior longitudinal ligament and cervical myelopathy, cervical stenosis, and neck pain.  It was elected to take the patient to surgery for posterior cervical decompression and fusion. She is morbidly obese with history of heart disease.  She had prior posterior cervical decompression and fusion C 3- C 4 level in 2013.  We elected to use Airo intraoperative CT navigation.  PROCEDURE: Patient was brought to the OR and following smooth and uncomplicated induction of GETA using Glide Scope, patient was placed in 3 pin fixation and rolled into a prone position on the Airo OR table.  Shoulders were taped to facilitate Xray visualization. Posterior neck was shaved with clippers, prepped and draped in the usual fashion with  betadine scrub followed by Duraprep.  Area of planned incision was infiltrated with local lidocaine.  Incision was made from C2-T1 and carried through the avascular midline plane to expose these lavels and their lateral masses.  There was significant arthritis at each of these levels with facet arthropathy and bony excrescences. Previous hardware was identified and later removed.  A navigation array was affixed to T 2.  A small opening was made in the dura at the time of exposure and this was later covered with Duragen and Dural sealant.   Lateral mass screws were placed from C3 to C7 bilaterally according to standard landmarks and their positioning was confirmed with fluoroscopy.  The old screws at C 3 and C 4 were replaced with Nuvasive screws as they were smaller and accepted a smaller rod, so they could not be incorporated into the new fusion.  Screws were 3.5 x 12 ,, at C 3, C 4 and left C 6, all others were 3.5 x 14 mm.  The facet joints and laminae were decorticated.  I had hoped to place screws in T 1 pedicles, but was unable to get sufficient lateral trajectory to do so sue to patient's morbid obesity.  We used 100 mm retractor blades to visualize the cervical spine.  Screws and rods were locked down in situ.  The spinous processes of C4-T 1 were then removed and the laminae were thinned with a high speed drill. A total laminectomy was performed with undercutting of the C4 lamina all the way to the top of T1 and the foraminae were also decompressed at each level. The spinal cord dura  appeared to be pulsatile and well decompressed.  The posterior elements and lateral masses were decorticated and the bone autograft was packed in the posterolateral region from C 3 - T 1 levels bilaterally.  Hemostasis was assured and a medium Hemovac drain was placed in the epidural space. Wound was closed with 0, 2-0, and 3-0 vicryl sutures and staples were used to re approximate the skin edges. Sterile occlusive dressing was  placed.  Patient was taken out of pins and turned back onto the OR table.  Patient was extubated in the operating room and taken to recovery in stable and satisfactory condition.  Counts were correct at the end of the case.  PLAN OF CARE: Admit to inpatient   PATIENT DISPOSITION:  PACU - hemodynamically stable.   Delay start of Pharmacological VTE agent (>24hrs) due to surgical blood loss or risk of bleeding: yes

## 2019-05-14 NOTE — Progress Notes (Signed)
Patient has previous history of blood refusal.  Blood refusal on file with blood bank.  Patient no longer refuses blood, and consents to having blood if needed. At PAT appointment, patient signed Type & Screen.   Blood bank aware of change.   Dr. Vertell Limber notified, and stated he will put in order for blood consent.

## 2019-05-14 NOTE — Transfer of Care (Signed)
Immediate Anesthesia Transfer of Care Note  Patient: [name]  Procedure(s) Performed: Posterior cervical decompression/fusion Cervical four to Thoracic one with exploration/revision of Cervical threee-four fusion (N/A Neck) APPLICATION OF INTRAOPERATIVE CT SCAN (N/A )  Patient Location: PACU  Anesthesia Type:General  Level of Consciousness: awake, alert  and oriented  Airway & Oxygen Therapy: Patient Spontanous Breathing and Patient connected to face mask oxygen  Post-op Assessment: Report given to RN and Post -op Vital signs reviewed and stable  Post vital signs: Reviewed and stable  Last Vitals:  Vitals Value Taken Time  BP 160/93 05/14/19 1248  Temp    Pulse 78 05/14/19 1254  Resp 16 05/14/19 1254  SpO2 95 % 05/14/19 1254  Vitals shown include unvalidated device data.  Last Pain:  Vitals:   05/14/19 0702  TempSrc:   PainSc: 4       Patients Stated Pain Goal: 2 ([id number])  Complications: No apparent anesthesia complications

## 2019-05-14 NOTE — Interval H&P Note (Signed)
History and Physical Interval Note:  05/14/2019 7:24 AM  [name]  has presented today for surgery, with the diagnosis of Cervical stenosis of spinal canal.  The various methods of treatment have been discussed with the patient and family. After consideration of risks, benefits and other options for treatment, the patient has consented to  Procedure(s): Posterior cervical decompression/fusion Cervical 4 to Thoracic 1 with exploration/revision of Cervical 3-4 fusion (N/A) APPLICATION OF INTRAOPERATIVE CT SCAN (N/A) as a surgical intervention.  The patient's history has been reviewed, patient examined, no change in status, stable for surgery.  I have reviewed the patient's chart and labs.  Questions were answered to the patient's satisfaction.     [name]

## 2019-05-14 NOTE — Progress Notes (Signed)
Patient is awake, alert, conversant.  MAEW with good power.  Patient is doing well.

## 2019-05-14 NOTE — Brief Op Note (Addendum)
05/14/2019  12:32 PM  PATIENT:  [name]  49 y.o. female  PRE-OPERATIVE DIAGNOSIS:  Cervical stenosis of spinal canal, cervical myelopathy, morbid obesity, ossification of posterior longitudinal ligament, cervicalgia  POST-OPERATIVE DIAGNOSIS:  Cervical stenosis of spinal canal, cervical myelopathy, morbid obesity, ossification of posterior longitudinal ligament, cervicalgia   PROCEDURE:  Procedure(s): Posterior cervical decompression/fusion Cervical four to Thoracic one with exploration/revision of Cervical threee-four fusion (N/A) APPLICATION OF INTRAOPERATIVE CT SCAN (N/A) with posterior decompression and fusion C 3 - T 1 levels  SURGEON:  Surgeon(s) and Role:    Erline Levine, MD - Primary    * Ashok Pall, MD - Assisting  PHYSICIAN ASSISTANT:   ASSISTANTS: Poteat, RN   ANESTHESIA:   general  EBL:  200 mL   BLOOD ADMINISTERED:none  DRAINS: (Medium) Hemovact drain(s) in the epidural space with  Suction Open   LOCAL MEDICATIONS USED:  MARCAINE    and LIDOCAINE   SPECIMEN:  No Specimen  DISPOSITION OF SPECIMEN:  N/A  COUNTS:  YES  TOURNIQUET:  * No tourniquets in log *  DICTATION: Patient is 49 year old woman with ossification of posterior longitudinal ligament and cervical myelopathy, cervical stenosis, and neck pain.  It was elected to take the patient to surgery for posterior cervical decompression and fusion. She is morbidly obese with history of heart disease.  She had prior posterior cervical decompression and fusion C 3- C 4 level in 2013.  We elected to use Airo intraoperative CT navigation.  PROCEDURE: Patient was brought to the OR and following smooth and uncomplicated induction of GETA using Glide Scope, patient was placed in 3 pin fixation and rolled into a prone position on the Airo OR table.  Shoulders were taped to facilitate Xray visualization. Posterior neck was shaved with clippers, prepped and draped in the usual fashion with betadine scrub  followed by Duraprep.  Area of planned incision was infiltrated with local lidocaine.  Incision was made from C2-T1 and carried through the avascular midline plane to expose these lavels and their lateral masses.  There was significant arthritis at each of these levels with facet arthropathy and bony excrescences. Previous hardware was identified and later removed.  A navigation array was affixed to T 2.  A small opening was made in the dura at the time of exposure and this was later covered with Duragen and Dural sealant.   Lateral mass screws were placed from C3 to C7 bilaterally according to standard landmarks and their positioning was confirmed with fluoroscopy.  The old screws at C 3 and C 4 were replaced with Nuvasive screws as they were smaller and accepted a smaller rod, so they could not be incorporated into the new fusion.  Screws were 3.5 x 12 ,, at C 3, C 4 and left C 6, all others were 3.5 x 14 mm.  The facet joints and laminae were decorticated.  I had hoped to place screws in T 1 pedicles, but was unable to get sufficient lateral trajectory to do so due to patient's morbid obesity.  We used 100 mm retractor blades to visualize the cervical spine.  Screws and rods were locked down in situ.  The spinous processes of C4-T 1 were then removed and the laminae were thinned with a high speed drill. A total laminectomy was performed with undercutting of the C4 lamina all the way to the top of T1 and the foraminae were also decompressed at each level. The spinal cord dura appeared to  be pulsatile and well decompressed.  The posterior elements and lateral masses were decorticated and the bone autograft was packed in the posterolateral region from C 3 - T 1 levels bilaterally. Hemostasis was assured and a medium Hemovac drain was placed in the epidural space. Wound was closed with 0, 2-0, and 3-0 vicryl sutures and staples were used to re approximate the skin edges. Sterile occlusive dressing was placed.   Patient was taken out of pins and turned back onto the OR table.  Patient was extubated in the operating room and taken to recovery in stable and satisfactory condition.  Counts were correct at the end of the case.  PLAN OF CARE: Admit to inpatient   PATIENT DISPOSITION:  PACU - hemodynamically stable.   Delay start of Pharmacological VTE agent (>24hrs) due to surgical blood loss or risk of bleeding: yes

## 2019-05-14 NOTE — Progress Notes (Signed)
Orthopedic Tech Progress Note Patient Details:  [name] 1970/07/02 [id number] Patient has collar per MD Patient ID: [name], female   DOB: 09-01-1970, 49 y.o.   MRN: [id number]   [name] 05/14/2019, 6:45 PM

## 2019-05-14 NOTE — Progress Notes (Signed)
[name], RRT helped me with adding attachment to patient's own mask and tubing to allow entrainment of O2 for patient on her own home CPAP machine.  Gave patient sterile water for machine.  Husband of patient self manages patient's home  CPAP machine.  Will continue to monitor patient.  Patient is currently on 6L [place] and sat is 95%.  Patient stated her pressure on her machine is 15 at home.

## 2019-05-14 NOTE — Progress Notes (Signed)
Patient suddenly became agitated and anxious, comfort measures, BIPAP was removed, therapeutic communication was used, repositioning were all provided. The measures were ineffective, Md was notified. Patient is upset that her husband can not be in the PACU due to COVID restrictions. MD is speaking with the patient and assessed her and her finger at the bedside.

## 2019-05-15 ENCOUNTER — Encounter: Payer: Self-pay | Admitting: *Deleted

## 2019-05-15 MED ORDER — PANTOPRAZOLE SODIUM 40 MG PO TBEC
40.0000 mg | DELAYED_RELEASE_TABLET | Freq: Every day | ORAL | Status: DC
  Administered 2019-05-15 – 2019-05-16 (×2): 40 mg via ORAL
  Filled 2019-05-15 (×2): qty 1

## 2019-05-15 MED FILL — Thrombin For Soln 5000 Unit: CUTANEOUS | Qty: 5000 | Status: AC

## 2019-05-15 NOTE — Progress Notes (Signed)
Patient had an uneventful night. Her foley was removed at 6 am. She required prn dilaudid twice throughout the shift. She was also given po oxycodone IR 5mg   Her  home medication Nuedexta is not stocked in the pharmacy. Was instructed to bring med from home. Husband is at the bedside with medication. Informed him we needed a doctor's order to administer the med. Attempted twice to call the number listed for MD stern but someone called back and said it was the wrong number. Will inform oncoming RN of the necessity to obtain an order to give home med.

## 2019-05-15 NOTE — Progress Notes (Signed)
Pt has home CPAP machine at bedside. Spouse is able to place pt on without difficulty. Pt home setting of 15 cmH2O w/4 Lpm bled into her home unit. Pt respiratory status is stable at this time. RT will continue to monitor.

## 2019-05-15 NOTE — Anesthesia Postprocedure Evaluation (Addendum)
Anesthesia Post Note  Patient: [name]  Procedure(s) Performed: Posterior cervical decompression/fusion Cervical four to Thoracic one with exploration/revision of Cervical threee-four fusion (N/A Neck) APPLICATION OF INTRAOPERATIVE CT SCAN (N/A )     Patient location during evaluation: PACU Anesthesia Type: General Level of consciousness: awake and alert Pain management: pain level controlled Vital Signs Assessment: post-procedure vital signs reviewed and stable Respiratory status: spontaneous breathing, nonlabored ventilation, respiratory function stable and patient connected to nasal cannula oxygen Cardiovascular status: blood pressure returned to baseline and stable Postop Assessment: no apparent nausea or vomiting Anesthetic complications: no    Last Vitals:  Vitals:   05/15/19 1127 05/15/19 1553  BP: (!) 144/84 (!) 160/92  Pulse: 87 80  Resp: 15 18  Temp: 36.8 C (!) 36.4 C  SpO2: 94% 98%    Last Pain:  Vitals:   05/15/19 1553  TempSrc: Oral  PainSc:                  Tiajuana Amass

## 2019-05-15 NOTE — Evaluation (Signed)
Occupational Therapy Evaluation Patient Details Name: [name] MRN: [id number] DOB: 1970-05-21 Today's Date: 05/15/2019    History of Present Illness 49 y/o female with history of OSA, obesity, anxiety, bipolar disorder, DM, CHF. Pt presents with cervical myelopathy with LUE numbness and tingling and BUE weakness. Pt underwent C4-T1 posterior cervical decompression and fusion on 4/15.   Clinical Impression   PTA, pt was living with her husband and was independent; works as a Conservation officer, historic buildings. Currently, pt requires Mod-Max A for bathing, dressing, and toilet hygiene. Pt performing functional mobility at [facility] A level. Provided education and handout on cervical precautions, brace management, UB dressing, grooming, and tub transfer. Pt willing to learn about AE for LB ADLs at next session; pt already owns a reacher and long handled sponge. Pt also presenting with decreased functional use of R (dominant hand) due to pain and poor activity tolerance with fatigue and requiring 4-6L O2. Would benefit from further acute OT to address LB ADLs, peri care, and RUE function. Answered all pt questions. Recommend dc home with HHOT to optimize safety and return to independence.     Follow Up Recommendations  Home health OT;Supervision/Assistance - 24 hour    Equipment Recommendations  None recommended by OT    Recommendations for Other Services PT consult     Precautions / Restrictions Precautions Precautions: Fall;Cervical Precaution Booklet Issued: Yes (comment) Precaution Comments: Reviewed cervical precautions Required Braces or Orthoses: Cervical Brace Cervical Brace: Hard collar;At all times      Mobility Bed Mobility Overal bed mobility: Needs Assistance Bed Mobility: Rolling;Sidelying to Sit Rolling: Supervision Sidelying to sit: Min assist       General bed mobility comments: In recliner upon arriva  Transfers Overall transfer level: Needs assistance    Transfers: Sit to/from Stand Sit to Stand: Min guard         General transfer comment: Min Guard A for safety    Balance Overall balance assessment: Needs assistance Sitting-balance support: No upper extremity supported;Feet supported Sitting balance-Leahy Scale: Good Sitting balance - Comments: supervision for static sitting balance   Standing balance support: No upper extremity supported Standing balance-Leahy Scale: Good Standing balance comment: close supervision without UE support                           ADL either performed or assessed with clinical judgement   ADL Overall ADL's : Needs assistance/impaired Eating/Feeding: Set up;Sitting   Grooming: Oral care;Min guard;Standing Grooming Details (indicate cue type and reason): Educating pt on use of two cups during oral care. SpO2 dropping to 85% on RA.  Upper Body Bathing: Moderate assistance;Sitting   Lower Body Bathing: Maximal assistance;Sit to/from stand   Upper Body Dressing : Moderate assistance;Maximal assistance;Sitting Upper Body Dressing Details (indicate cue type and reason): Educating pt and husband on techniques for odnning shirts including button ups and collecting neck of pull over shirt. Reviewed how to don/doff brace and pt's husband verbalizing understanding. Lower Body Dressing: Maximal assistance;Sit to/from stand Lower Body Dressing Details (indicate cue type and reason): Pt willing to learn about AE for LB ADLs Toilet Transfer: Min guard;Ambulation(simulated to recliner)     Toileting - Clothing Manipulation Details (indicate cue type and reason): Pt reporting that husband assisted with peri care. Would benefit from education on toilet aide   Tub/Shower Transfer Details (indicate cue type and reason): Educating pt on safe tub transfer. Discussing use of shower seat for  fatigue and pt rpeorting she thinks she will be ok without one. Discussed how to purchase seat if needed. Pt reporting  she plans to sponge bath at first. Functional mobility during ADLs: Min guard General ADL Comments: Pt presenting with decreased fucntional performance with limited ROM and activity tolerance     Vision         Perception     Praxis      Pertinent Vitals/Pain Pain Assessment: Faces Faces Pain Scale: Hurts even more Pain Location: neck Pain Descriptors / Indicators: Grimacing Pain Intervention(s): Monitored during session;Limited activity within patient's tolerance;Repositioned     Hand Dominance Right   Extremity/Trunk Assessment Upper Extremity Assessment Upper Extremity Assessment: RUE deficits/detail RUE Deficits / Details: Decreased strength. Limited digit ROM with increased pain at index and middle finger RUE Sensation: (paresthesias to R fingers, median nerve distribution) LUE Deficits / Details: ROM WFL, at least 4-/5, grip 5/5   Lower Extremity Assessment Lower Extremity Assessment: Overall WFL for tasks assessed   Cervical / Trunk Assessment Cervical / Trunk Assessment: Other exceptions Cervical / Trunk Exceptions: s/p Cervical sx   Communication Communication Communication: No difficulties   Cognition Arousal/Alertness: Awake/alert Behavior During Therapy: WFL for tasks assessed/performed Overall Cognitive Status: Within Functional Limits for tasks assessed                                     General Comments  Upon arrival, pt on RA and Spo2 86%. Placed back on 6L O2 and SpO2 elevated to 94%. During oral care, performed on RA and pt dropping to 85%. Placed back on 6L and weaned down to 4L. Pt reporting she doesnt wear O2 at home    Exercises     Shoulder Instructions      Home Living Family/patient expects to be discharged to:: Private residence Living Arrangements: Spouse/significant other Available Help at Discharge: Family;Available 24 hours/day Type of Home: House Home Access: Stairs to enter [employer] of Steps:  2 Entrance Stairs-Rails: Right Home Layout: One level     Bathroom Shower/Tub: Teacher, early years/pre: Standard     Home Equipment: Financial controller: Reacher;Long-handled sponge        Prior Functioning/Environment Level of Independence: Independent        Comments: history of agoraphobia per prior PT notes. Pt learning to play drums.        OT Problem List: Decreased strength;Decreased range of motion;Decreased activity tolerance;Impaired balance (sitting and/or standing);Decreased knowledge of use of DME or AE;Decreased knowledge of precautions;Pain      OT Treatment/Interventions: Self-care/ADL training;Therapeutic exercise;Energy conservation;DME and/or AE instruction;Therapeutic activities;Patient/family education    OT Goals(Current goals can be found in the care plan section) Acute Rehab OT Goals Patient Stated Goal: To go home OT Goal Formulation: With patient/family Time For Goal Achievement: 05/29/19 Potential to Achieve Goals: Good  OT Frequency: Min 2X/week   Barriers to D/C:            Co-evaluation              AM-PAC OT "6 Clicks" Daily Activity     Outcome Measure Help from another person eating meals?: A Little Help from another person taking care of personal grooming?: A Little Help from another person toileting, which includes using toliet, bedpan, or urinal?: A Lot Help from another person bathing (including washing, rinsing, drying)?: A Lot Help from another person  to put on and taking off regular upper body clothing?: A Lot Help from another person to put on and taking off regular lower body clothing?: A Lot 6 Click Score: 14   End of Session Equipment Utilized During Treatment: Oxygen;Cervical collar Nurse Communication: Mobility status  Activity Tolerance: Patient tolerated treatment well Patient left: in chair;with call bell/phone within reach;with family/visitor present  OT Visit Diagnosis:  Unsteadiness on feet (R26.81);Other abnormalities of gait and mobility (R26.89);Muscle weakness (generalized) (M62.81);Pain Pain - part of body: (Neck)                Time: [id number] OT Time Calculation (min): 34 min Charges:  OT General Charges $OT Visit: 1 Visit OT Evaluation $OT Eval Low Complexity: 1 Low OT Treatments $Self Care/Home Management : 8-22 mins  Mylz Yuan MSOT, OTR/L Acute Rehab Pager: [phone] Office: [place] 05/15/2019, 12:47 PM

## 2019-05-15 NOTE — Evaluation (Signed)
Physical Therapy Evaluation Patient Details Name: [name] MRN: [id number] DOB: 1970-07-23 Today's Date: 05/15/2019   History of Present Illness  49 y/o female with history of OSA, obesity, anxiety, bipolar disorder, DM, CHF. Pt presents with cervical myelopathy with LUE numbness and tingling and BUE weakness. Pt underwent C4-T1 posterior cervical decompression and fusion on 4/15.  Clinical Impression  Pt presents to PT with deficits in gait, balance, strength, sensation, and with significant pain. Pt is able to mobilize with minimal physical assistance at this time, although is unsteady without UE support with noted increase in lateral sway. Pt with pain to light touch in RUE at this time, otherwise sensation improved in LUE. Pt will continue to benefit from PT POC and aggressive mobilization to improve balance and restore independence in mobility.    Follow Up Recommendations Home health PT;Supervision/Assistance - 24 hour    Equipment Recommendations  (pt can borrow RW from family if needed)    Recommendations for Other Services       Precautions / Restrictions Precautions Precautions: Fall;Cervical Precaution Comments: verbally reviewed cervical precautions Required Braces or Orthoses: Cervical Brace Cervical Brace: Hard collar;At all times      Mobility  Bed Mobility Overal bed mobility: Needs Assistance Bed Mobility: Rolling;Sidelying to Sit Rolling: Supervision Sidelying to sit: Min assist          Transfers Overall transfer level: Needs assistance   Transfers: Sit to/from Stand Sit to Stand: Supervision            Ambulation/Gait Ambulation/Gait assistance: Min guard Gait Distance (Feet): 30 Feet Assistive device: None Gait Pattern/deviations: Step-to pattern;Drifts right/left Gait velocity: reduced Gait velocity interpretation: <1.8 ft/sec, indicate of risk for recurrent falls General Gait Details: pt with increased lateral instability and  one minor LOB which she corrects with stepping strategy.  Stairs            Wheelchair Mobility    Modified Rankin (Stroke Patients Only)       Balance Overall balance assessment: Needs assistance Sitting-balance support: No upper extremity supported;Feet supported Sitting balance-Leahy Scale: Good Sitting balance - Comments: supervision for static sitting balance   Standing balance support: No upper extremity supported Standing balance-Leahy Scale: Good Standing balance comment: close supervision without UE support                             Pertinent Vitals/Pain Pain Assessment: Faces Faces Pain Scale: Hurts even more Pain Location: neck Pain Descriptors / Indicators: Grimacing Pain Intervention(s): Limited activity within patient's tolerance    Home Living Family/patient expects to be discharged to:: Private residence Living Arrangements: Spouse/significant other Available Help at Discharge: Family;Available 24 hours/day Type of Home: House Home Access: Stairs to enter Entrance Stairs-Rails: Right Entrance Stairs-Number of Steps: 2 Home Layout: One level Home Equipment: None      Prior Function Level of Independence: Independent         Comments: history of agoraphobia per prior PT notes. Pt learning to play drums.     Hand Dominance   Dominant Hand: Right    Extremity/Trunk Assessment   Upper Extremity Assessment Upper Extremity Assessment: RUE deficits/detail;LUE deficits/detail RUE Deficits / Details: ROM WFL, strength assessment deferred, at least 4-/5 RUE Sensation: (paresthesias to R fingers, median nerve distribution) LUE Deficits / Details: ROM WFL, at least 4-/5, grip 5/5    Lower Extremity Assessment Lower Extremity Assessment: Overall WFL for tasks assessed    Cervical /  Trunk Assessment Cervical / Trunk Assessment: Other exceptions Cervical / Trunk Exceptions: C-collar  Communication   Communication: No  difficulties  Cognition Arousal/Alertness: Awake/alert Behavior During Therapy: WFL for tasks assessed/performed Overall Cognitive Status: Within Functional Limits for tasks assessed                                        General Comments General comments (skin integrity, edema, etc.): pt on 6L [place] upon PT arrival, saturating at 90%. PT attempts to wean to room air with desaturation to 86%. PT places pt on 2L [place] with improvement in sats to 90%. At rest in recliner pt saturating 88-90% on 2L, PT increases back to 6L [place] with no change in saturation. Pt reports being a mouth breather and may benefit from placement of canula in mouth to improve saturation.    Exercises     Assessment/Plan    PT Assessment Patient needs continued PT services  PT Problem List Decreased strength;Decreased activity tolerance;Decreased balance;Decreased mobility;Decreased knowledge of use of DME;Pain       PT Treatment Interventions DME instruction;Gait training;Stair training;Functional mobility training;Therapeutic activities;Therapeutic exercise;Balance training;Neuromuscular re-education;Patient/family education    PT Goals (Current goals can be found in the Care Plan section)  Acute Rehab PT Goals Patient Stated Goal: To go home PT Goal Formulation: With patient Time For Goal Achievement: 05/29/19 Potential to Achieve Goals: Good    Frequency Min 5X/week   Barriers to discharge        Co-evaluation               AM-PAC PT "6 Clicks" Mobility  Outcome Measure Help needed turning from your back to your side while in a flat bed without using bedrails?: None Help needed moving from lying on your back to sitting on the side of a flat bed without using bedrails?: A Little Help needed moving to and from a bed to a chair (including a wheelchair)?: None Help needed standing up from a chair using your arms (e.g., wheelchair or bedside chair)?: None Help needed to walk in hospital  room?: A Little Help needed climbing 3-5 steps with a railing? : A Lot 6 Click Score: 20    End of Session Equipment Utilized During Treatment: Oxygen Activity Tolerance: Patient tolerated treatment well Patient left: in chair;with call bell/phone within reach;with family/visitor present Nurse Communication: Mobility status PT Visit Diagnosis: Unsteadiness on feet (R26.81)    [id number] PT Time Calculation (min) (ACUTE ONLY): 36 min   Charges:   PT Evaluation $PT Eval Moderate Complexity: 1 Mod PT Treatments $Therapeutic Activity: 8-22 mins        Zenaida Niece, PT, DPT Acute Rehabilitation Pager: [phone]   Zenaida Niece 05/15/2019, 9:11 AM

## 2019-05-15 NOTE — Progress Notes (Addendum)
Subjective: Patient reports "I'm not hurting in my neck, just the thumb and two fingers of this right hand"  Objective: Vital signs in last 24 hours: Temp:  [97.3 F (36.3 C)-99.1 F (37.3 C)] 97.6 F (36.4 C) (04/16 0746) Pulse Rate:  [71-97] 88 (04/16 0746) Resp:  [12-20] 16 (04/16 0746) BP: (119-178)/(53-93) 150/88 (04/16 0746) SpO2:  [90 %-98 %] 95 % (04/16 0746) FiO2 (%):  [50 %] 50 % (04/15 1748)  Intake/Output from previous day: 04/15 0701 - 04/16 0700 In: 3515 [P.O.:265; I.V.:2200; IV Piggyback:250] Out: 1105 [Urine:650; Drains:255; Blood:200] Intake/Output this shift: No intake/output data recorded.  Alert, conversant, smiling. Reports pain/skin sensitivity right thumb, index, amd middle fingers since surgery. Reports no cervical shoulder pain at rest, only sore with position changes. Drsg intact with old blood beneath honeycomb (staples/bacitracin ointment not visible), no swelling. Hemovac patent ~145ml overnight. Full strength BUE and hand intrinsics. Collar in use.   Lab Results: Recent Labs    05/14/19 0944 05/14/19 1115  HGB 17.0* 16.0*  HCT 50.0* 47.0*   BMET Recent Labs    05/14/19 0944 05/14/19 1115  NA 138 136  K 4.3 4.9  CL 100  --   GLUCOSE 117*  --   BUN 18  --   CREATININE 1.00  --     Studies/Results: No results found.  Assessment/Plan: improving  LOS: 1 day  Mobilize this am. [place] for [employer] home med [facility] pharmacy does not stock). Possibly home this afternoon if Hemovac volumes continue to decline. [place] for husband to stay tonight if she is not discharged today.    Verdis Prime 05/15/2019, 8:15 AM   I have advised patient that I would like to monitor her in hospital one more day and continue Hemovac drain.  Thumb and right index fingers are painful.  No apparent weakness.  We will monitor this as well.

## 2019-05-16 NOTE — Progress Notes (Signed)
Removed Hemovac from surgical area per orders. Tip intact, suture removed easily without any left in skin. Dressing changed, no new drainage under old dressing. Redressed with honey comb with cloth tape on top to protect from rolling edges. At the very top inch of the incision under the posterior fleshy neck roll, the incision is very tender and slightly redder than other areas. No drainage. Dry gauze had been on place to pad the collar and this was switched to a softer ABD pad. Will recheck on this and pass on to next shift as patient may be discharges in am.Staples intact. Needs a second set of pad for [facility]. Patient and husband education on dressing change pending any new discharge orders. Medicated for pain afterward, but tolerated well. Simmie Davies RN

## 2019-05-16 NOTE — Progress Notes (Signed)
  NEUROSURGERY PROGRESS NOTE   No issues overnight.  Persistent dysesthetic pain in right hand/arm No new weakness, N/T Eager for discharge home  EXAM:  BP (!) 153/87 (BP Location: Left Arm)   Pulse 80   Temp 99.7 F (37.6 C) (Oral)   Resp 18   Ht 5\' 5"  (1.651 m)   Wt (!) 154.2 kg   LMP  (LMP Unknown) Comment: Takes BCP continuously LMP 2006  SpO2 92%   BMI 56.58 kg/m   Awake, alert, oriented  Speech fluent, appropriate  CN grossly intact  5/5 BUE/BLE  Incision: dried blood on bandage, hemovac in place Removed bandage, no active drainage, no fluctuance, redness/warmth  IMPRESSION/PLAN 49 y.o. female s/p posterior cervical decompression and fusion. Stable neurologically, persistent but stable dysesthetic pain right hand/arm. - Remove hemovac today. Change bandage.  - Continue to monitor. If stable tomorrow, will d/c home

## 2019-05-16 NOTE — TOC Initial Note (Signed)
Transition of Care [facility]) - Initial/Assessment Note    Patient Details  Name: [name] MRN: [id number] Date of Birth: 03-07-1970  [facility]) CM/SW Contact:    Oretha Milch, LCSW Phone Number: 05/16/2019, 3:37 PM  Clinical Narrative: CSW followed up with patient regarding recommendation of home health PT/OT and rolling walker. Patient denied need for any durable medical equipment at this time. Patient requested to call husband to be present for conversation. CSW attempted and left voicemail, but noted shortly after patient's husband presented to patient's room. CSW notes at this time the patient is requesting no home health PT/OT but would be open to having home health nursing to check in on patient to evaluate healing with her recent incision. CSW received verbal consent to refer patient for home health RN services. CSW is pursuing and will update note when a provider is found.                Expected Discharge Plan: [place] Barriers to Discharge: Insurance Authorization   Patient Goals and CMS Choice Patient states their goals for this hospitalization and ongoing recovery are:: "Go back home and be able to take care of myself." CMS Medicare.gov Compare Post Acute Care list provided to:: Patient Choice offered to / list presented to : Patient, Spouse  Expected Discharge Plan and Services Expected Discharge Plan: [place] In-house Referral: NA   Post Acute Care Choice: Home Health                                        Prior Living Arrangements/Services   Lives with:: Spouse Patient language and need for interpreter reviewed:: Yes Do you feel safe going back to the place where you live?: Yes      Need for Family Participation in Patient Care: No (Comment) Care giver support system in place?: No (comment)   Criminal Activity/Legal Involvement Pertinent to Current Situation/Hospitalization: No - Comment as  needed  Activities of Daily Living      Permission Sought/Granted Permission sought to share information with : Facility Sport and exercise psychologist, Family Supports Permission granted to share information with : Yes, Verbal Permission Granted              Emotional Assessment Appearance:: Appears older than stated age Attitude/Demeanor/Rapport: Engaged Affect (typically observed): Calm Orientation: : Oriented to Self, Oriented to Place, Oriented to  Time, Oriented to Situation Alcohol / Substance Use: Not Applicable Psych Involvement: No (comment)  Admission diagnosis:  Cervical myelopathy ([place]) [G95.9] Patient Active Problem List   Diagnosis Date Noted  . Cervical myelopathy ([place]) 05/14/2019  . Left buttock abscess 08/27/2017  . Chronic respiratory failure with hypoxia ([place]) 02/28/2017  . OSA on CPAP   . Polycythemia vera ([place])   . Bipolar I disorder ([place])   . Depression   . Anxiety state   . Chronic combined systolic and diastolic CHF (congestive heart failure) ([place])   . Pulmonary hypertension ([place])   . DM2 (diabetes mellitus, type 2) ([place]) 05/04/2015  . Bipolar disorder ([place]) 11/14/2012  . Anxiety   . Asthma 09/05/2011  . Allergic rhinitis 05/16/2011  . Cardiomyopathy ([place]) 07/06/2010  . Obesity 07/06/2010  . OSA (obstructive sleep apnea) 06/05/2010  . Obesity hypoventilation syndrome ([place]) 06/05/2010   PCP:  [place], Betty G, MD Pharmacy:   [facility] -  [address], [place] [address] Phone: [phone] Fax: [phone]  [facility] [address] [place] [address] Phone: [phone] Fax: [place], [place] - [address] [place] [address] Phone: [phone] Fax:  [phone]     Social Determinants of Health (SDOH) Interventions    Readmission Risk Interventions No flowsheet data found.

## 2019-05-16 NOTE — Progress Notes (Addendum)
Occupational Therapy Treatment Patient Details Name: [name] MRN: [id number] DOB: Mar 17, 1970 Today's Date: 05/16/2019    History of present illness 49 y/o female with history of OSA, obesity, anxiety, bipolar disorder, DM, CHF. Pt presents with cervical myelopathy with LUE numbness and tingling and BUE weakness. Pt underwent C4-T1 posterior cervical decompression and fusion on 4/15.   OT comments  Completed education regarding cervical precautions and compensatory techniques and AE for ADL and functional mobility for ADL. Pt is experiencing hypersensitivity of R hand and weak grip. Nsg aware. Pt states PA was aware as well. If pt continues to experience R hand weakness, she would benefit from outpt OT. Will continue to follow acutely.   Follow Up Recommendations  Outpatient OT;Supervision - Intermittent(outpt OT if hand does not improve)    Equipment Recommendations  None recommended by OT    Recommendations for Other Services      Precautions / Restrictions Precautions Precautions: Fall;Cervical Precaution Booklet Issued: (reviewed precautions) Precaution Comments: Reviewed cervical precautions Required Braces or Orthoses: Cervical Brace Cervical Brace: Hard collar;At all times       Mobility Bed Mobility Overal bed mobility: Needs Assistance Bed Mobility: Sit to Sidelying   Sidelying to sit: Supervision     Sit to sidelying: Supervision General bed mobility comments: Educated pt/husband on proper technique for getting in /out of bed. Pt able to return demonstrate  Transfers Overall transfer level: Modified independent                    Balance Overall balance assessment: Needs assistance   Sitting balance-Leahy Scale: Good       Standing balance-Leahy Scale: Good                             ADL either performed or assessed with clinical judgement   ADL                                         General ADL  Comments: Completed education regading use of AE for LB ADL.Pt able to return demonstrate use of reacher adn sock aid for LB ADL. Husband will assist. Pt has toilet aide that she plans to use. Husband present for education     Vision       Perception     Praxis      Cognition Arousal/Alertness: Awake/alert Behavior During Therapy: WFL for tasks assessed/performed Overall Cognitive Status: Within Functional Limits for tasks assessed                                          Exercises Exercises: Other exercises Other Exercises Other Exercises: edema contorl with ice, elevation and frequent ROM o fdigits Other Exercises: squeeze ball Other Exercises: red tubing given for utensils to use for self-feeding   Shoulder Instructions       General Comments Husband states he is at home with pt all of the time and works from home    Pertinent Vitals/ Pain       Pain Assessment: 0-10 Pain Score: 5  Pain Location: (neck and R hand) Pain Descriptors / Indicators: Burning;Pins and needles(R hand) Pain Intervention(s): Limited activity within patient's tolerance;Repositioned;Ice applied(R hand)  Home Living  Prior Functioning/Environment              Frequency  Min 2X/week        Progress Toward Goals  OT Goals(current goals can now be found in the care plan section)  Progress towards OT goals: Progressing toward goals  Acute Rehab OT Goals Patient Stated Goal: To go home OT Goal Formulation: With patient/family Time For Goal Achievement: 05/29/19 Potential to Achieve Goals: Good ADL Goals Pt Will Perform Upper Body Dressing: with min assist;with caregiver independent in assisting;sitting Pt Will Perform Lower Body Dressing: with min guard assist;sit to/from stand;with adaptive equipment;with caregiver independent in assisting Pt Will Transfer to Toilet: with min guard assist;ambulating;bedside  commode Pt Will Perform Toileting - Clothing Manipulation and hygiene: with min guard assist;sit to/from stand;with adaptive equipment Pt/caregiver will Perform Home Exercise Program: Right Upper extremity;With Supervision  Plan Discharge plan needs to be updated    Co-evaluation                 AM-PAC OT "6 Clicks" Daily Activity     Outcome Measure   Help from another person eating meals?: A Little Help from another person taking care of personal grooming?: A Little Help from another person toileting, which includes using toliet, bedpan, or urinal?: A Lot Help from another person bathing (including washing, rinsing, drying)?: A Lot Help from another person to put on and taking off regular upper body clothing?: A Lot Help from another person to put on and taking off regular lower body clothing?: A Lot 6 Click Score: 14    End of Session Equipment Utilized During Treatment: Cervical collar  OT Visit Diagnosis: Unsteadiness on feet (R26.81);Other abnormalities of gait and mobility (R26.89);Muscle weakness (generalized) (M62.81);Pain Pain - Right/Left: Right Pain - part of body: Hand   Activity Tolerance Patient tolerated treatment well   Patient Left in bed;with call bell/phone within reach;with family/visitor present   Nurse Communication Mobility status;Other (comment)(R hand weakness)        Time: 1630-1700 OT Time Calculation (min): 30 min  Charges: OT General Charges $OT Visit: 1 Visit OT Treatments $Self Care/Home Management : 8-22 mins $Therapeutic Exercise: 8-22 mins  Maurie Boettcher, OT/L   Acute OT Clinical Specialist Acute Rehabilitation Services Pager [phone] Office [phone]    [facility] 05/16/2019, 5:27 PM

## 2019-05-16 NOTE — Progress Notes (Signed)
Physical Therapy Treatment Patient Details Name: [name] MRN: [id number] DOB: [birth year] Today's Date: 05/16/2019    History of Present Illness 49 y/o female with history of OSA, obesity, anxiety, bipolar disorder, DM, CHF. Pt presents with cervical myelopathy with LUE numbness and tingling and BUE weakness. Pt underwent C4-T1 posterior cervical decompression and fusion on 4/15.    PT Comments    Pt pleasant sitting in recliner with spouse present. Pt reports continued RUE and neck pain but improving. Pt educated for all precautions, transfers, and gait. 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 Pt noted to be diaphoretic and 2/4 DOE with limited gait of 180' for stair training this session with SpO2 93% on RA and HR 113. Pt educated for walking program to achieve 5 min with progression to 15 as well as use of IS as pt only able to achieve 1250cc. Pt and spouse report understanding.     Follow Up Recommendations  Home health PT;Supervision for mobility/OOB     Equipment Recommendations  None recommended by PT    Recommendations for Other Services       Precautions / Restrictions Precautions Precautions: Fall;Cervical Precaution Comments: Reviewed cervical precautions Cervical Brace: Hard collar;At all times    Mobility  Bed Mobility Overal bed mobility: Needs Assistance Bed Mobility: Sit to Sidelying   Sidelying to sit: Min assist     Sit to sidelying: Supervision General bed mobility comments: in recliner on arrival but able to transition sit to supine with supervision and required min assist to lift trunk for return to sitting  Transfers Overall transfer level: Modified independent                  Ambulation/Gait Ambulation/Gait assistance: Supervision Gait Distance (Feet): 180 Feet Assistive device: None Gait Pattern/deviations: Step-through  pattern;Decreased stride length;Wide base of support   Gait velocity interpretation: >2.62 ft/sec, indicative of community ambulatory General Gait Details: pt with increased lateral sway with pt diaphoretic end of gait. Pt admittedly does not normally walk more than 100' at a time   Stairs Stairs: Yes Stairs assistance: Supervision Stair Management: Step to pattern;Forwards;One rail Right Number of Stairs: 2 General stair comments: pt performed well with need for supervision with descent due to inability to see stairs with collar   Wheelchair Mobility    Modified Rankin (Stroke Patients Only)       Balance Overall balance assessment: Needs assistance   Sitting balance-Leahy Scale: Good       Standing balance-Leahy Scale: Good                              Cognition Arousal/Alertness: Awake/alert Behavior During Therapy: WFL for tasks assessed/performed Overall Cognitive Status: Within Functional Limits for tasks assessed                                        Exercises      General Comments        Pertinent Vitals/Pain Pain Assessment: 0-10 Pain Score: 5  Pain Location: neck and RUE Pain Descriptors / Indicators: Grimacing;Guarding Pain Intervention(s): Limited activity within patient's tolerance;Premedicated before session;Repositioned    Home Living  Prior Function            PT Goals (current goals can now be found in the care plan section) Progress towards PT goals: Progressing toward goals    Frequency           PT Plan Current plan remains appropriate    Co-evaluation              AM-PAC PT "6 Clicks" Mobility   Outcome Measure  Help needed turning from your back to your side while in a flat bed without using bedrails?: None Help needed moving from lying on your back to sitting on the side of a flat bed without using bedrails?: A Little Help needed moving to and from a bed  to a chair (including a wheelchair)?: None Help needed standing up from a chair using your arms (e.g., wheelchair or bedside chair)?: None Help needed to walk in hospital room?: A Little Help needed climbing 3-5 steps with a railing? : A Little 6 Click Score: 21    End of Session Equipment Utilized During Treatment: Cervical collar Activity Tolerance: Patient tolerated treatment well Patient left: in chair;with call bell/phone within reach;with family/visitor present Nurse Communication: Mobility status PT Visit Diagnosis: Unsteadiness on feet (R26.81)     Time: [id number] PT Time Calculation (min) (ACUTE ONLY): 23 min  Charges:  $Gait Training: 8-22 mins $Therapeutic Activity: 8-22 mins                     Korena Nass P, PT Acute Rehabilitation Services Pager: [phone] Office: [phone]    Toini Failla B Austynn Pridmore 05/16/2019, 1:49 PM

## 2019-05-16 NOTE — Progress Notes (Signed)
Pt has home cpap and places self on/off as needed. Pt aware to call if she needs any assistance.  RT will continue to monitor.

## 2019-05-17 MED ORDER — METHOCARBAMOL 750 MG PO TABS: 750.0000 mg | ORAL_TABLET | Freq: Three times a day (TID) | ORAL | 1 refills | Status: DC | PRN

## 2019-05-17 MED ORDER — HYDROCODONE-ACETAMINOPHEN 5-325 MG PO TABS: 1.0000 | ORAL_TABLET | ORAL | 0 refills | Status: DC | PRN

## 2019-05-17 MED ORDER — ASPIRIN 81 MG PO TABS: 81.0000 mg | ORAL_TABLET | Freq: Every day | ORAL | Status: AC

## 2019-05-17 NOTE — Discharge Instructions (Signed)
Incision Care, Adult An incision is a cut that a doctor makes in your skin for surgery (for a procedure). Most times, these cuts are closed after surgery. Your cut from surgery may be closed with stitches (sutures), staples, skin glue, or skin tape (adhesive strips). You may need to return to your doctor to have stitches or staples taken out. This may happen many days or many weeks after your surgery. The cut needs to be well cared for so it does not get infected. How to care for your cut Cut care   Follow instructions from your doctor about how to take care of your cut. Make sure you: ? Wash your hands with soap and water before you change your bandage (dressing). If you cannot use soap and water, use hand sanitizer. ? Change your bandage as told by your doctor. ? Leave stitches, skin glue, or skin tape in place. They may need to stay in place for 2 weeks or longer. If tape strips get loose and curl up, you may trim the loose edges. Do not remove tape strips completely unless your doctor says it is okay.  Check your cut area every day for signs of infection. Check for: ? More redness, swelling, or pain. ? More fluid or blood. ? Warmth. ? Pus or a bad smell.  Ask your doctor how to clean the cut. This may include: ? Using mild soap and water. ? Using a clean towel to pat the cut dry after you clean it. ? Putting a cream or ointment on the cut. Do this only as told by your doctor. ? Covering the cut with a clean bandage.  Ask your doctor when you can leave the cut uncovered.  Do not take baths, swim, or use a hot tub until your doctor says it is okay. Ask your doctor if you can take showers. You may only be allowed to take sponge baths for bathing. Medicines  If you were prescribed an antibiotic medicine, cream, or ointment, take the antibiotic or put it on the cut as told by your doctor. Do not stop taking or putting on the antibiotic even if your condition gets better.  Take  over-the-counter and prescription medicines only as told by your doctor. General instructions  Limit movement around your cut. This helps healing. ? Avoid straining, lifting, or exercise for the first month, or for as long as told by your doctor. ? Follow instructions from your doctor about going back to your normal activities. ? Ask your doctor what activities are safe.  Protect your cut from the sun when you are outside for the first 6 months, or for as long as told by your doctor. Put on sunscreen around the scar or cover up the scar.  Keep all follow-up visits as told by your doctor. This is important. Contact a doctor if:  Your have more redness, swelling, or pain around the cut.  You have more fluid or blood coming from the cut.  Your cut feels warm to the touch.  You have pus or a bad smell coming from the cut.  You have a fever or shaking chills.  You feel sick to your stomach (nauseous) or you throw up (vomit).  You are dizzy.  Your stitches or staples come undone. Get help right away if:  You have a red streak coming from your cut.  Your cut bleeds through the bandage and the bleeding does not stop with gentle pressure.  The edges of your cut   open up and separate.  You have very bad (severe) pain.  You have a rash.  You are confused.  You pass out (faint).  You have trouble breathing and you have a fast heartbeat. This information is not intended to replace advice given to you by your health care provider. Make sure you discuss any questions you have with your health care provider. Document Revised: 06/04/2016 Document Reviewed: 09/23/2015 Elsevier Patient Education  Tea. See AVS summary with discharge instructions.

## 2019-05-17 NOTE — Progress Notes (Signed)
86 Pt taken to car via wheelchair, spouse checked for all belongings, all AVS and instructions given. Patient was thankful for her care during her stay. Simmie Davies RN

## 2019-05-17 NOTE — Progress Notes (Signed)
Discussed discharge planning and AVS, answered questions, referred to Dr. Melven Sartorius office if other questions arise or PCP. [place] ordered but patient feels only need RN to check incision. PT/OT ordered as well. Again referred to call office if concerns or questions. Reviewed s/s of infection to call MD/provider for and patient and spouse verbalized understanding of this and the other items on AVS. No f/u appt on AVS. Pt will call office Monday to verify need. IV removed. Dressing intact, collar in place. Vitals signs stable. MEWS [place]. Sitting up in chair. Simmie Davies RN

## 2019-05-17 NOTE — TOC Transition Note (Addendum)
Transition of Care (TOC) - CM/SW Discharge Note Marvetta Gibbons RN,BSN Transitions of Care Unit 4NP (non trauma) - RN Case Manager [phone] Weekend Coverage  Patient Details  Name: [name] MRN: [id number] Date of Birth: 07-15-1970  Transition of Care [facility]) CM/SW Contact:  Dawayne Patricia, RN Phone Number: 05/17/2019, 9:25 AM   Clinical Narrative:    Pt stable for transition home today, noted previous TOC note that pt declined any DME needs and HHPT/OT. No order for [facility] noted and pt does not have any wound care needs. Previous CSW attempted to find agency for [place] however no agency was able to be found for nursing as pt does not have skilled nursing needs for home health. Pt can f/u with Dr. Melven Sartorius office should she have concerns regarding wound healing.      Barriers to Discharge: Insurance Authorization   Patient Goals and CMS Choice Patient states their goals for this hospitalization and ongoing recovery are:: "Go back home and be able to take care of myself." CMS Medicare.gov Compare Post Acute Care list provided to:: Patient Choice offered to / list presented to : Patient, Spouse  Discharge Placement               Home        Discharge Plan and Services In-house Referral: NA   Post Acute Care Choice: Home Health           Pt declined                    Social Determinants of Health (SDOH) Interventions     Readmission Risk Interventions Readmission Risk Prevention Plan 05/17/2019  Post Dischage Appt Complete  Medication Screening Complete  Transportation Screening Complete  Some recent data might be hidden

## 2019-05-17 NOTE — Discharge Summary (Signed)
Physician Discharge Summary  Patient ID: [name] MRN: [id number] DOB/AGE: 05/26/1970 49 y.o.  Admit date: 05/14/2019 Discharge date: 05/17/2019  Admission Diagnoses:  Cervical myelopathy  Discharge Diagnoses:  Same Active Problems:   Cervical myelopathy [facility])  Discharged Condition: Stable  Hospital Course:  [name] is a 49 y.o. female who was admitted for the below procedure. There were no post operative complications. At time of discharge, pain was well controlled, ambulating with Pt/OT, tolerating po, voiding normal. Ready for discharge.  Treatments: Surgery Posterior cervical decompression/fusion Cervical four to Thoracic one with exploration/revision of Cervical threee-four fusion (N/A) APPLICATION OF INTRAOPERATIVE CT SCAN (N/A)  Discharge Exam: Blood pressure 131/80, pulse 83, temperature 97.9 F (36.6 C), temperature source Oral, resp. rate 20, height 5\' 5"  (1.651 m), weight (!) 154.2 kg, SpO2 94 %. Awake, alert, oriented Speech fluent, appropriate CN grossly intact 5/5 BUE/BLE Wound c/d/i  Disposition: Discharge disposition: 01-Home or Self Care       Discharge Instructions    Call MD for:  difficulty breathing, headache or visual disturbances   Complete by: As directed    Call MD for:  persistant dizziness or light-headedness   Complete by: As directed    Call MD for:  redness, tenderness, or signs of infection (pain, swelling, redness, odor or green/yellow discharge around incision site)   Complete by: As directed    Call MD for:  severe uncontrolled pain   Complete by: As directed    Call MD for:  temperature >100.4   Complete by: As directed    Diet general   Complete by: As directed    Driving Restrictions   Complete by: As directed    Do not drive until given clearance.   Increase activity slowly   Complete by: As directed    Lifting restrictions   Complete by: As directed    Do not lift anything >10lbs. Avoid bending  and twisting in awkward positions. Avoid bending at the back.   May shower / Bathe   Complete by: As directed    In 24 hours. Okay to wash wound with warm soapy water. Avoid scrubbing the wound. Pat dry.   Remove dressing in 24 hours   Complete by: As directed      Allergies as of 05/17/2019      Reactions   Savella [milnacipran Hcl]    mania      Medication List    TAKE these medications   acetaminophen 500 MG tablet Commonly known as: TYLENOL Take 1,000 mg by mouth every 6 (six) hours as needed for moderate pain.   albuterol 108 (90 Base) MCG/ACT inhaler Commonly known as: VENTOLIN HFA Inhale 2 puffs into the lungs every 6 (six) hours as needed for wheezing or shortness of breath.   ALPRAZolam 1 MG tablet Commonly known as: XANAX Take 1 mg by mouth 3 (three) times daily.   amphetamine-dextroamphetamine 20 MG tablet Commonly known as: ADDERALL Take 20 mg by mouth in the morning, at noon, in the evening, and at bedtime.   aspirin 81 MG tablet Take 1 tablet (81 mg total) by mouth daily. Start taking on: May 21, 2019 What changed: These instructions start on May 21, 2019. If you are unsure what to do until then, ask your doctor or other care provider.   atorvastatin 20 MG tablet Commonly known as: LIPITOR TAKE 1 TABLET BY MOUTH EVERY DAY IN THE EVENING What changed:   how much to take  how to  take this   b complex vitamins tablet Take 1 tablet by mouth daily.   Chromium Picolinate 500 MCG Tabs Take 500 mcg by mouth daily.   CITRACAL PO Take 2 tablets by mouth daily.   clonazePAM 1 MG tablet Commonly known as: KLONOPIN Take 1 tablet (1 mg total) by mouth 3 (three) times daily. What changed: when to take this   Fish Oil 1200 MG Caps Take 2,400 mg by mouth daily.   furosemide 40 MG tablet Commonly known as: LASIX TAKE 2 TABLETS (80MG ) TWICEA DAY What changed: See the new instructions.   HYDROcodone-acetaminophen 5-325 MG tablet Commonly known as:  NORCO/VICODIN Take 1-2 tablets by mouth every 4 (four) hours as needed for moderate pain.   Klor-Con M20 20 MEQ tablet Generic drug: potassium chloride SA TAKE 1 TABLET DAILY OFFICE VISIT NEEDED What changed: See the new instructions.   lamoTRIgine 200 MG tablet Commonly known as: LAMICTAL Take 200 mg by mouth 2 (two) times daily.   lidocaine 5 % Commonly known as: LIDODERM Place 1 patch onto the skin daily as needed (for knee pain). Remove & Discard patch within 12 hours or as directed by MD   losartan 25 MG tablet Commonly known as: COZAAR Take 1 tablet (25 mg total) by mouth 2 (two) times daily.   Lyrica 300 MG capsule Generic drug: pregabalin Take 300 mg by mouth 2 (two) times daily.   MAGNESIUM PO Take 500 mg by mouth daily.   meloxicam 7.5 MG tablet Commonly known as: MOBIC Take 15 mg by mouth in the morning and at bedtime.   metFORMIN 500 MG tablet Commonly known as: GLUCOPHAGE Take 1 tablet (500 mg total) by mouth 2 (two) times daily with a meal.   methocarbamol 750 MG tablet Commonly known as: Robaxin-750 Take 1 tablet (750 mg total) by mouth 3 (three) times daily as needed for muscle spasms.   Microgestin FE 1.5/30 1.5-30 MG-MCG tablet Generic drug: norethindrone-ethinyl estradiol-iron Take 1 tablet by mouth at bedtime.   multivitamin tablet Take 1 tablet by mouth daily.   Nuedexta 20-10 MG capsule Generic drug: Dextromethorphan-quiNIDine Take 1 capsule by mouth every 12 (twelve) hours.   traMADol 50 MG tablet Commonly known as: ULTRAM Take 50 mg by mouth 3 (three) times daily as needed for moderate pain.   vitamin C 500 MG tablet Commonly known as: ASCORBIC ACID Take 1,000 mg by mouth daily.   Vitamin D3 125 MCG (5000 UT) Tabs Take 10,000 Units by mouth in the morning and at bedtime.   Voltaren 1 % Gel Generic drug: diclofenac Sodium Apply 2 g topically 3 (three) times daily as needed (knee pain).   Vraylar 6 MG Caps Generic drug:  Cariprazine HCl Take 6 mg by mouth every evening.   Zinc 50 MG Caps Take 50 mg by mouth daily.      Follow-up Information    Erline Levine, MD Follow up.   Specialty: Neurosurgery Contact information: [address] [place] [address] [phone]           Signed: Traci Sermon 05/17/2019, 9:58 AM

## 2019-05-17 NOTE — Progress Notes (Signed)
  NEUROSURGERY PROGRESS NOTE   No issues overnight.  Slight improvement in dysesthetic pain in right hand/arm No new N/T/W Feels ready for discharge home   EXAM:  BP 131/80 (BP Location: Left Arm)   Pulse 83   Temp 97.9 F (36.6 C) (Oral)   Resp 20   Ht 5\' 5"  (1.651 m)   Wt (!) 154.2 kg   LMP  (LMP Unknown) Comment: Takes BCP continuously LMP 2006  SpO2 94%   BMI 56.58 kg/m   Awake, alert, oriented  Speech fluent, appropriate  CN grossly intact  Incision: bandage dry, no drainage, no fluctuance  IMPRESSION/PLAN 49 y.o. female s/p posterior cervical decompression and fusion. Stable neurologically, persistent but slightly improved dysesthetic pain right hand/arm. - d/c home with [facility]

## 2019-05-19 ENCOUNTER — Ambulatory Visit: Payer: BC Managed Care – PPO | Admitting: Psychology

## 2019-05-21 MED FILL — Heparin Sodium (Porcine) Inj 1000 Unit/ML: INTRAMUSCULAR | Qty: 30 | Status: AC

## 2019-05-21 MED FILL — Sodium Chloride IV Soln 0.9%: INTRAVENOUS | Qty: 1000 | Status: AC

## 2019-05-22 ENCOUNTER — Ambulatory Visit (INDEPENDENT_AMBULATORY_CARE_PROVIDER_SITE_OTHER): Payer: BC Managed Care – PPO | Admitting: Psychology

## 2019-05-22 DIAGNOSIS — F319 Bipolar disorder, unspecified: Secondary | ICD-10-CM

## 2019-05-26 ENCOUNTER — Ambulatory Visit: Payer: BC Managed Care – PPO | Admitting: Psychology

## 2019-06-02 ENCOUNTER — Ambulatory Visit (INDEPENDENT_AMBULATORY_CARE_PROVIDER_SITE_OTHER): Payer: BC Managed Care – PPO | Admitting: Psychology

## 2019-06-02 DIAGNOSIS — F319 Bipolar disorder, unspecified: Secondary | ICD-10-CM | POA: Diagnosis not present

## 2019-06-09 ENCOUNTER — Ambulatory Visit (INDEPENDENT_AMBULATORY_CARE_PROVIDER_SITE_OTHER): Payer: BC Managed Care – PPO | Admitting: Psychology

## 2019-06-09 DIAGNOSIS — F319 Bipolar disorder, unspecified: Secondary | ICD-10-CM

## 2019-06-10 DIAGNOSIS — M4712 Other spondylosis with myelopathy, cervical region: Secondary | ICD-10-CM | POA: Diagnosis not present

## 2019-06-10 DIAGNOSIS — I10 Essential (primary) hypertension: Secondary | ICD-10-CM | POA: Diagnosis not present

## 2019-06-10 DIAGNOSIS — M5412 Radiculopathy, cervical region: Secondary | ICD-10-CM | POA: Diagnosis not present

## 2019-06-10 DIAGNOSIS — M4802 Spinal stenosis, cervical region: Secondary | ICD-10-CM | POA: Diagnosis not present

## 2019-06-10 DIAGNOSIS — Z6841 Body Mass Index (BMI) 40.0 and over, adult: Secondary | ICD-10-CM | POA: Diagnosis not present

## 2019-06-10 DIAGNOSIS — G959 Disease of spinal cord, unspecified: Secondary | ICD-10-CM | POA: Diagnosis not present

## 2019-06-16 ENCOUNTER — Ambulatory Visit (INDEPENDENT_AMBULATORY_CARE_PROVIDER_SITE_OTHER): Payer: BC Managed Care – PPO | Admitting: Psychology

## 2019-06-16 DIAGNOSIS — F319 Bipolar disorder, unspecified: Secondary | ICD-10-CM

## 2019-06-23 ENCOUNTER — Ambulatory Visit (INDEPENDENT_AMBULATORY_CARE_PROVIDER_SITE_OTHER): Payer: BC Managed Care – PPO | Admitting: Psychology

## 2019-06-23 DIAGNOSIS — F319 Bipolar disorder, unspecified: Secondary | ICD-10-CM

## 2019-06-30 ENCOUNTER — Ambulatory Visit (INDEPENDENT_AMBULATORY_CARE_PROVIDER_SITE_OTHER): Payer: BC Managed Care – PPO | Admitting: Psychology

## 2019-06-30 DIAGNOSIS — F319 Bipolar disorder, unspecified: Secondary | ICD-10-CM | POA: Diagnosis not present

## 2019-07-02 ENCOUNTER — Telehealth: Payer: Self-pay | Admitting: Family Medicine

## 2019-07-02 NOTE — Telephone Encounter (Signed)
Pt would like a refill for Metformin 500 mg sent to [facility] order pharmacy.  Pt aware appt is needed for another refill but pt recently had back surgery on 05/14/19 and unable to drive.

## 2019-07-06 ENCOUNTER — Other Ambulatory Visit: Payer: Self-pay | Admitting: *Deleted

## 2019-07-06 MED ORDER — METFORMIN HCL 500 MG PO TABS: 500.0000 mg | ORAL_TABLET | Freq: Two times a day (BID) | ORAL | 0 refills | Status: DC

## 2019-07-06 NOTE — Telephone Encounter (Signed)
Rx sent to the pharmacy as requested. ?

## 2019-07-07 ENCOUNTER — Ambulatory Visit (INDEPENDENT_AMBULATORY_CARE_PROVIDER_SITE_OTHER): Payer: BC Managed Care – PPO | Admitting: Psychology

## 2019-07-07 DIAGNOSIS — F319 Bipolar disorder, unspecified: Secondary | ICD-10-CM | POA: Diagnosis not present

## 2019-07-14 ENCOUNTER — Ambulatory Visit (INDEPENDENT_AMBULATORY_CARE_PROVIDER_SITE_OTHER): Payer: BC Managed Care – PPO | Admitting: Psychology

## 2019-07-14 DIAGNOSIS — F319 Bipolar disorder, unspecified: Secondary | ICD-10-CM

## 2019-07-21 ENCOUNTER — Other Ambulatory Visit: Payer: Self-pay | Admitting: Cardiology

## 2019-07-21 ENCOUNTER — Ambulatory Visit (INDEPENDENT_AMBULATORY_CARE_PROVIDER_SITE_OTHER): Payer: BC Managed Care – PPO | Admitting: Psychology

## 2019-07-21 DIAGNOSIS — F319 Bipolar disorder, unspecified: Secondary | ICD-10-CM | POA: Diagnosis not present

## 2019-07-28 ENCOUNTER — Ambulatory Visit (INDEPENDENT_AMBULATORY_CARE_PROVIDER_SITE_OTHER): Payer: BC Managed Care – PPO | Admitting: Psychology

## 2019-07-28 DIAGNOSIS — F319 Bipolar disorder, unspecified: Secondary | ICD-10-CM | POA: Diagnosis not present

## 2019-08-04 ENCOUNTER — Ambulatory Visit: Payer: BC Managed Care – PPO | Admitting: Psychology

## 2019-08-05 ENCOUNTER — Other Ambulatory Visit: Payer: Self-pay | Admitting: Cardiology

## 2019-08-05 ENCOUNTER — Telehealth: Payer: Self-pay | Admitting: Adult Health

## 2019-08-05 DIAGNOSIS — G4733 Obstructive sleep apnea (adult) (pediatric): Secondary | ICD-10-CM

## 2019-08-05 NOTE — Telephone Encounter (Signed)
Patient's husband calling about new CPAP machine. Last OV note from 3/26/20231 mentions ordering a new CPAP machine but order was not placed. Can we order? Please advise.

## 2019-08-05 NOTE — Telephone Encounter (Signed)
Sorry not sure why the order did not go  In  Can we verify what her pressure setting are with DME and then I will place order.  If you will print CPAP download I can get setting from it. Thanks so much   tp

## 2019-08-06 NOTE — Telephone Encounter (Signed)
[name] can use those setting  Please enroll in airview on new machine

## 2019-08-06 NOTE — Telephone Encounter (Signed)
Order was sent to [facility] and I spoke with the pt and notified that this was done

## 2019-08-06 NOTE — Telephone Encounter (Signed)
Not sure I had a download at [facility] 03/2019 but did not note settings. Should be able to get that download ?

## 2019-08-06 NOTE — Telephone Encounter (Signed)
Called and spoke with Huey Romans They are not able to pull anything up for her CPAP DL  She got her machine in June 2012- settings at this time were auto 10-16

## 2019-08-06 NOTE — Telephone Encounter (Signed)
[name], last OV reported settings of  10-16 for her CPAP. [employer] has her registered but last data is from February. Can I order this for her new machine from [place]? Tried to reach [facility] this morning to check on why she does not have recent data.

## 2019-08-11 ENCOUNTER — Ambulatory Visit (INDEPENDENT_AMBULATORY_CARE_PROVIDER_SITE_OTHER): Payer: BC Managed Care – PPO | Admitting: Psychology

## 2019-08-11 DIAGNOSIS — F319 Bipolar disorder, unspecified: Secondary | ICD-10-CM

## 2019-08-18 ENCOUNTER — Ambulatory Visit: Payer: BC Managed Care – PPO | Admitting: Psychology

## 2019-08-25 ENCOUNTER — Ambulatory Visit (INDEPENDENT_AMBULATORY_CARE_PROVIDER_SITE_OTHER): Payer: BC Managed Care – PPO | Admitting: Psychology

## 2019-08-25 DIAGNOSIS — F319 Bipolar disorder, unspecified: Secondary | ICD-10-CM

## 2019-09-01 ENCOUNTER — Ambulatory Visit: Payer: BC Managed Care – PPO | Admitting: Psychology

## 2019-09-03 ENCOUNTER — Ambulatory Visit (INDEPENDENT_AMBULATORY_CARE_PROVIDER_SITE_OTHER): Payer: BC Managed Care – PPO | Admitting: Psychology

## 2019-09-03 DIAGNOSIS — F319 Bipolar disorder, unspecified: Secondary | ICD-10-CM

## 2019-09-08 ENCOUNTER — Ambulatory Visit (INDEPENDENT_AMBULATORY_CARE_PROVIDER_SITE_OTHER): Payer: BC Managed Care – PPO | Admitting: Psychology

## 2019-09-08 DIAGNOSIS — F319 Bipolar disorder, unspecified: Secondary | ICD-10-CM

## 2019-09-15 ENCOUNTER — Ambulatory Visit (INDEPENDENT_AMBULATORY_CARE_PROVIDER_SITE_OTHER): Payer: BC Managed Care – PPO | Admitting: Psychology

## 2019-09-15 DIAGNOSIS — F319 Bipolar disorder, unspecified: Secondary | ICD-10-CM

## 2019-09-21 ENCOUNTER — Other Ambulatory Visit: Payer: Self-pay | Admitting: Cardiology

## 2019-09-21 DIAGNOSIS — Z7189 Other specified counseling: Secondary | ICD-10-CM | POA: Insufficient documentation

## 2019-09-21 NOTE — Progress Notes (Signed)
Virtual Visit via Video Note   This visit type was conducted due to national recommendations for restrictions regarding the COVID-19 Pandemic (e.g. social distancing) in an effort to limit this patient's exposure and mitigate transmission in our community.  Due to her co-morbid illnesses, this patient is at least at moderate risk for complications without adequate follow up.  This format is felt to be most appropriate for this patient at this time.  All issues noted in this document were discussed and addressed.  A limited physical exam was performed with this format.  Please refer to the patient's chart for her consent to telehealth for [facility].       Date:  09/22/2019   ID:  [name], DOB 1970/04/09, MRN [id number] The patient was identified using 2 identifiers.  Patient Location: Home Provider Location: Office/Clinic  PCP:  [place], Betty G, MD  Cardiologist:  Minus Breeding, MD  Electrophysiologist:  None   Evaluation Performed:  Follow-Up Visit  Chief Complaint:  Pulmonary HTN  History of Present Illness:    [name] is a 49 y.o. female with mildly reduced ejection fraction although in the last visit it was well preserved.  She has had some mildly elevated pulmonary pressures as well.  We have been following this although we have not been able to do an echo in some time because of Covid.  She checks in today for yearly follow-up and actually says she is doing quite well.  She has not had to use her inhalers in a while.  She is not on any home O2.  She has had no swelling.  She thinks her breathing is okay.  She did have neck surgery and has pain related to this.  This was cervical disc surgery.  The patient does not have symptoms concerning for COVID-19 infection (fever, chills, cough, or new shortness of breath).    Past Medical History:  Diagnosis Date  . Acute systolic congestive heart failure ([place])   . Agoraphobia   . Anxiety    panic attacks   . Arthritis    osteoarthritis bilal. knees  . Asthma    no per PFT 7/13; reports does not have asthma  . Bipolar disorder ([place])   . Carpal tunnel syndrome of left wrist 05/2011   Being evaluated for MS  . Cor pulmonale ([place])   . Depression   . History of pleural effusion   . Hyperlipidemia   . Hypertension   . Hypoxemia    history of - no home O2  . IBS (irritable bowel syndrome)   . Morbid obesity ([place])   . Obesity hypoventilation syndrome ([place])   . Pneumonia   . Pre-diabetes   . Prediabetes   . Seasonal allergies    current runny nose  . Seizures ([place])    febrile seizure x 1 as a child. Several times  . Sleep apnea sleep study 07/02/2010   uses CPAP nightly  . Spinal stenosis in cervical region   . Urinary incontinence    Past Surgical History:  Procedure Laterality Date  . APPLICATION OF INTRAOPERATIVE CT SCAN N/A 05/14/2019   Procedure: APPLICATION OF INTRAOPERATIVE CT SCAN;  Surgeon: Erline Levine, MD;  Location: [place];  Service: Neurosurgery;  Laterality: N/A;  . CARPAL TUNNEL RELEASE  06/27/2011   Procedure: CARPAL TUNNEL RELEASE;  Surgeon: Wynonia Sours, MD;  Location: [place];  Service: Orthopedics;  Laterality: Left;  . CARPAL TUNNEL RELEASE  12/19/2011  Procedure: CARPAL TUNNEL RELEASE;  Surgeon: Wynonia Sours, MD;  Location: [place];  Service: Orthopedics;  Laterality: Right;  . INCISION AND DRAINAGE ABSCESS Left 08/27/2017   Procedure: INCISION AND DRAINAGE LEFT BUTTOCK  ABSCESS;  Surgeon: Excell Seltzer, MD;  Location: [facility];  Service: General;  Laterality: Left;  . IRRIGATION AND DEBRIDEMENT BUTTOCKS Left 08/30/2017   Procedure: IRRIGATION AND DEBRIDEMENT RE-EXCISION OF LEFT SUBCUTANEOUS BUTTOCKS ABCESS;  Surgeon: Kinsinger, Arta Bruce, MD;  Location: [facility];  Service: General;  Laterality: Left;  [name] MASS EXCISION  08/07/2010   right index  . POSTERIOR CERVICAL FUSION/FORAMINOTOMY  09/11/2011   Procedure: POSTERIOR CERVICAL  FUSION/FORAMINOTOMY LEVEL 1;  Surgeon: Erline Levine, MD;  Location: [place] [facility];  Service: Neurosurgery;  Laterality: N/A;  Cervical Three-Four Posteior Cervical Fusion and Decompression.  [name] POSTERIOR CERVICAL FUSION/FORAMINOTOMY N/A 05/14/2019   Procedure: Posterior cervical decompression/fusion Cervical four to Thoracic one with exploration/revision of Cervical threee-four fusion;  Surgeon: Erline Levine, MD;  Location: [place];  Service: Neurosurgery;  Laterality: N/A;  . TRIGGER FINGER RELEASE  12/19/2011   Procedure: RELEASE TRIGGER FINGER/A-1 PULLEY;  Surgeon: Wynonia Sours, MD;  Location: [place];  Service: Orthopedics;  Laterality: Right;  . TRIGGER FINGER RELEASE Left 12/31/2012   Procedure: RELEASE A-1 PULLEY LEFT THUMB;  Surgeon: Wynonia Sours, MD;  Location: [place];  Service: Orthopedics;  Laterality: Left;  . WISDOM TOOTH EXTRACTION       Current Meds  Medication Sig  . acetaminophen (TYLENOL) 500 MG tablet Take 1,000 mg by mouth every 6 (six) hours as needed for moderate pain.   [name] albuterol (VENTOLIN HFA) 108 (90 Base) MCG/ACT inhaler Inhale 2 puffs into the lungs every 6 (six) hours as needed for wheezing or shortness of breath.  . ALPRAZolam (XANAX) 1 MG tablet Take 1 mg by mouth 3 (three) times daily.   [name] amphetamine-dextroamphetamine (ADDERALL) 20 MG tablet Take 20 mg by mouth in the morning, at noon, in the evening, and at bedtime.   . Ascorbic Acid (VITAMIN C) 500 MG tablet Take 1,000 mg by mouth daily.   [name] aspirin 81 MG tablet Take 1 tablet (81 mg total) by mouth daily.  [name] atorvastatin (LIPITOR) 20 MG tablet TAKE 1 TABLET EVERY EVENING  . b complex vitamins tablet Take 1 tablet by mouth daily.  . Calcium Citrate (CITRACAL PO) Take 2 tablets by mouth daily.  . Cholecalciferol (VITAMIN D3) 5000 UNITS TABS Take 10,000 Units by mouth in the morning and at bedtime.   . Chromium Picolinate 500 MCG TABS Take 500 mcg by mouth daily.   . clonazePAM  (KLONOPIN) 1 MG tablet Take 1 tablet (1 mg total) by mouth 3 (three) times daily. (Patient taking differently: Take 1 mg by mouth in the morning, at noon, in the evening, and at bedtime. )  . diclofenac Sodium (VOLTAREN) 1 % GEL Apply 2 g topically 3 (three) times daily as needed (knee pain).  . furosemide (LASIX) 40 MG tablet TAKE 2 TABLETS BY MOUTH TWICE A DAY  . [name] 1.5/30 1.5-30 MG-MCG tablet Take 1 tablet by mouth at bedtime.  [name] KLOR-CON M20 20 MEQ tablet TAKE 1 TABLET DAILY OFFICE VISIT NEEDED (Patient taking differently: Take 20 mEq by mouth daily. )  . lamoTRIgine (LAMICTAL) 200 MG tablet Take 200 mg by mouth 2 (two) times daily.   [name] lidocaine (LIDODERM) 5 % Place 1 patch onto the skin daily as needed (for knee  pain). Remove & Discard patch within 12 hours or as directed by MD  . losartan (COZAAR) 25 MG tablet TAKE 1 TABLET TWICE A DAY  . LYRICA 300 MG capsule Take 300 mg by mouth 2 (two) times daily.   [name] MAGNESIUM PO Take 500 mg by mouth daily.   . metFORMIN (GLUCOPHAGE) 500 MG tablet Take 1 tablet (500 mg total) by mouth 2 (two) times daily with a meal.  . methocarbamol (ROBAXIN-750) 750 MG tablet Take 1 tablet (750 mg total) by mouth 3 (three) times daily as needed for muscle spasms.  . Multiple Vitamin (MULTIVITAMIN) tablet Take 1 tablet by mouth daily.    [name] NUEDEXTA 20-10 MG CAPS Take 1 capsule by mouth every 12 (twelve) hours.   . Omega-3 Fatty Acids (FISH OIL) 1200 MG CAPS Take 2,400 mg by mouth daily.   [name] VRAYLAR 6 MG CAPS Take 6 mg by mouth every evening.   . Zinc 50 MG CAPS Take 50 mg by mouth daily.     Allergies:   Savella [milnacipran hcl]   Social History   Tobacco Use  . Smoking status: Never Smoker  . Smokeless tobacco: Never Used  Substance Use Topics  . Alcohol use: Yes    Comment: rare  . Drug use: No     Family Hx: The patient's family history includes Allergic rhinitis in her mother; Asthma in her maternal grandmother and mother; Multiple sclerosis in  her mother.  ROS:   Please see the history of present illness.     All other systems reviewed and are negative.   Prior CV studies:   The following studies were reviewed today:  None  Labs/Other Tests and Data Reviewed:    EKG:  No ECG reviewed.  Recent Labs: 05/11/2019: Platelets 239 05/14/2019: BUN 18; Creatinine, Ser 1.00; Hemoglobin 16.0; Potassium 4.9; Sodium 136   Recent Lipid Panel Lab Results  Component Value Date/Time   CHOL 159 07/11/2017 03:38 PM   TRIG 98.0 07/11/2017 03:38 PM   HDL 63.80 07/11/2017 03:38 PM   CHOLHDL 2 07/11/2017 03:38 PM   LDLCALC 76 07/11/2017 03:38 PM    Wt Readings from Last 3 Encounters:  09/22/19 (!) 329 lb (149.2 kg)  05/14/19 (!) 340 lb (154.2 kg)  05/11/19 (!) 341 lb (154.7 kg)     Objective:    Vital Signs:  BP 137/81   Pulse 77   Ht 5\' 5"  (1.651 m)   Wt (!) 329 lb (149.2 kg)   SpO2 98%   BMI 54.75 kg/m    VITAL SIGNS:  reviewed GEN:  no acute distress EYES:  sclerae anicteric, EOMI - Extraocular Movements Intact NEURO:  alert and oriented x 3, no obvious focal deficit PSYCH:  normal affect  ASSESSMENT & PLAN:    Cardiomyopathy - I will check an ejection fraction and follow-up for pulmonary hypertension with an echocardiogram once Covid has resolved and I will plan for this for 6 months from now.  Otherwise she is doing well no change in therapy.  Pulmonary HTN - As above.  HTN -  Her BP is well controlled.  She will continue the meds as listed.  SLEEP APNEA -  She uses her CPAP.  No change in therapy.  Dyslipidemia - LDL was 76 with an HDL of 63.8.  This was in 2019 and should be repeated when she has repeat labs.  COVID-19 Education: She has had her vaccine.  She will get her booster and we discussed this  briefly.   Time:   Today, I have spent 16 minutes with the patient with telehealth technology discussing the above problems.     Medication Adjustments/Labs and Tests Ordered: Current medicines  are reviewed at length with the patient today.  Concerns regarding medicines are outlined above.   Tests Ordered: No orders of the defined types were placed in this encounter.   Medication Changes: No orders of the defined types were placed in this encounter.   Follow Up:  In Person in one year.   Signed, Minus Breeding, MD  09/22/2019 4:29 PM    [place]

## 2019-09-22 ENCOUNTER — Telehealth (INDEPENDENT_AMBULATORY_CARE_PROVIDER_SITE_OTHER): Payer: BC Managed Care – PPO | Admitting: Cardiology

## 2019-09-22 ENCOUNTER — Telehealth: Payer: Self-pay | Admitting: *Deleted

## 2019-09-22 ENCOUNTER — Encounter: Payer: Self-pay | Admitting: Cardiology

## 2019-09-22 ENCOUNTER — Telehealth: Payer: Self-pay | Admitting: Pulmonary Disease

## 2019-09-22 ENCOUNTER — Ambulatory Visit (INDEPENDENT_AMBULATORY_CARE_PROVIDER_SITE_OTHER): Payer: BC Managed Care – PPO | Admitting: Psychology

## 2019-09-22 VITALS — BP 137/81 | HR 77 | Ht 65.0 in | Wt 329.0 lb

## 2019-09-22 DIAGNOSIS — I10 Essential (primary) hypertension: Secondary | ICD-10-CM | POA: Diagnosis not present

## 2019-09-22 DIAGNOSIS — I272 Pulmonary hypertension, unspecified: Secondary | ICD-10-CM | POA: Diagnosis not present

## 2019-09-22 DIAGNOSIS — Z7189 Other specified counseling: Secondary | ICD-10-CM

## 2019-09-22 DIAGNOSIS — F319 Bipolar disorder, unspecified: Secondary | ICD-10-CM | POA: Diagnosis not present

## 2019-09-22 DIAGNOSIS — I420 Dilated cardiomyopathy: Secondary | ICD-10-CM

## 2019-09-22 DIAGNOSIS — E785 Hyperlipidemia, unspecified: Secondary | ICD-10-CM

## 2019-09-22 NOTE — Telephone Encounter (Signed)
Called and left message for patient to register for a replacement machine on the [employer] and to call [facility]. Message in detail, nothing further needed.

## 2019-09-22 NOTE — Telephone Encounter (Signed)
Left message to call back to review AVS from today's visit

## 2019-09-22 NOTE — Patient Instructions (Signed)
Medication Instructions:  Your physician recommends that you continue on your current medications as directed. Please refer to the Current Medication list given to you today.  *If you need a refill on your cardiac medications before your next appointment, please call your pharmacy*  Lab Work: NONE   Testing/Procedures: Your physician has requested that you have an echocardiogram. Echocardiography is a painless test that uses sound waves to create images of your heart. It provides your doctor with information about the size and shape of your heart and how well your hearts chambers and valves are working. This procedure takes approximately one hour. There are no restrictions for this procedure. IN 6 MONTHS  Follow-Up: At [facility], you and your health needs are our priority.  As part of our continuing mission to provide you with exceptional heart care, we have created designated Provider Care Teams.  These Care Teams include your primary Cardiologist (physician) and Advanced Practice Providers (APPs -  Physician Assistants and Nurse Practitioners) who all work together to provide you with the care you need, when you need it.  We recommend signing up for the patient portal called "MyChart".  Sign up information is provided on this After Visit Summary.  MyChart is used to connect with patients for Virtual Visits (Telemedicine).  Patients are able to view lab/test results, encounter notes, upcoming appointments, etc.  Non-urgent messages can be sent to your provider as well.   To learn more about what you can do with MyChart, go to [email or web address].    Your next appointment:   12 month(s)  You will receive a reminder letter in the mail two months in advance. If you don't receive a letter, please call our office to schedule the follow-up appointment.  The format for your next appointment:   In Person  Provider:   You may see Minus Breeding, MD or one of the following Advanced Practice  Providers on your designated Care Team:    Rosaria Ferries, PA-C  Jory Sims, DNP, ANP  Cadence Kathlen Mody, [place]

## 2019-09-22 NOTE — Addendum Note (Signed)
Addended by: Alvina Filbert B on: 09/22/2019 04:49 PM   Modules accepted: Orders

## 2019-09-25 NOTE — Telephone Encounter (Signed)
Mailed AVS and sent message to scheduling to arrange 6 mnth ECHO

## 2019-09-29 ENCOUNTER — Ambulatory Visit (INDEPENDENT_AMBULATORY_CARE_PROVIDER_SITE_OTHER): Payer: BC Managed Care – PPO | Admitting: Psychology

## 2019-09-29 DIAGNOSIS — F319 Bipolar disorder, unspecified: Secondary | ICD-10-CM

## 2019-10-01 ENCOUNTER — Other Ambulatory Visit: Payer: Self-pay | Admitting: Family Medicine

## 2019-10-08 ENCOUNTER — Other Ambulatory Visit: Payer: Self-pay | Admitting: Cardiology

## 2019-10-13 ENCOUNTER — Ambulatory Visit (INDEPENDENT_AMBULATORY_CARE_PROVIDER_SITE_OTHER): Payer: BC Managed Care – PPO | Admitting: Psychology

## 2019-10-13 DIAGNOSIS — F319 Bipolar disorder, unspecified: Secondary | ICD-10-CM | POA: Diagnosis not present

## 2019-10-14 ENCOUNTER — Other Ambulatory Visit: Payer: Self-pay | Admitting: Cardiology

## 2019-10-14 NOTE — Telephone Encounter (Signed)
*  STAT* If patient is at the pharmacy, call can be transferred to refill team.   1. Which medications need to be refilled? (please list name of each medication and dose if known)  atorvastatin (LIPITOR) 20 MG tablet furosemide (LASIX) 40 MG tablet Potassium 20 meq  2. Which pharmacy/location (including street and city if local pharmacy) is medication to be sent to? [id number] [place], [place] - [address] AT [address]  3. Do they need a 30 day or 90 day supply? 90 day supply

## 2019-10-16 MED ORDER — ATORVASTATIN CALCIUM 20 MG PO TABS
20.0000 mg | ORAL_TABLET | Freq: Every evening | ORAL | 0 refills | Status: DC
Start: 2019-10-16 — End: 2020-04-07

## 2019-10-16 MED ORDER — FUROSEMIDE 40 MG PO TABS: 80.0000 mg | ORAL_TABLET | Freq: Two times a day (BID) | ORAL | 1 refills | Status: DC

## 2019-10-16 NOTE — Telephone Encounter (Signed)
Medication sent to pharmacy  

## 2019-10-20 ENCOUNTER — Ambulatory Visit (INDEPENDENT_AMBULATORY_CARE_PROVIDER_SITE_OTHER): Payer: BC Managed Care – PPO | Admitting: Psychology

## 2019-10-20 DIAGNOSIS — F319 Bipolar disorder, unspecified: Secondary | ICD-10-CM

## 2019-10-27 ENCOUNTER — Ambulatory Visit (INDEPENDENT_AMBULATORY_CARE_PROVIDER_SITE_OTHER): Payer: BC Managed Care – PPO | Admitting: Psychology

## 2019-10-27 DIAGNOSIS — F319 Bipolar disorder, unspecified: Secondary | ICD-10-CM

## 2019-11-03 ENCOUNTER — Ambulatory Visit (INDEPENDENT_AMBULATORY_CARE_PROVIDER_SITE_OTHER): Payer: BC Managed Care – PPO | Admitting: Psychology

## 2019-11-03 DIAGNOSIS — F319 Bipolar disorder, unspecified: Secondary | ICD-10-CM

## 2019-11-10 ENCOUNTER — Ambulatory Visit (INDEPENDENT_AMBULATORY_CARE_PROVIDER_SITE_OTHER): Payer: BC Managed Care – PPO | Admitting: Psychology

## 2019-11-10 DIAGNOSIS — F319 Bipolar disorder, unspecified: Secondary | ICD-10-CM | POA: Diagnosis not present

## 2019-11-17 ENCOUNTER — Ambulatory Visit (INDEPENDENT_AMBULATORY_CARE_PROVIDER_SITE_OTHER): Payer: BC Managed Care – PPO | Admitting: Psychology

## 2019-11-17 DIAGNOSIS — F319 Bipolar disorder, unspecified: Secondary | ICD-10-CM | POA: Diagnosis not present

## 2019-11-24 ENCOUNTER — Ambulatory Visit (INDEPENDENT_AMBULATORY_CARE_PROVIDER_SITE_OTHER): Payer: BC Managed Care – PPO | Admitting: Psychology

## 2019-11-24 DIAGNOSIS — F319 Bipolar disorder, unspecified: Secondary | ICD-10-CM | POA: Diagnosis not present

## 2019-11-25 ENCOUNTER — Telehealth: Payer: Self-pay | Admitting: Pulmonary Disease

## 2019-11-25 DIAGNOSIS — G4733 Obstructive sleep apnea (adult) (pediatric): Secondary | ICD-10-CM

## 2019-11-25 NOTE — Telephone Encounter (Signed)
Spoke with pt. She is needing her CPAP prescription from [place] resent to [place]. This has been placed. Nothing further was needed.

## 2019-12-01 ENCOUNTER — Ambulatory Visit (INDEPENDENT_AMBULATORY_CARE_PROVIDER_SITE_OTHER): Payer: BC Managed Care – PPO | Admitting: Psychology

## 2019-12-01 DIAGNOSIS — F319 Bipolar disorder, unspecified: Secondary | ICD-10-CM | POA: Diagnosis not present

## 2019-12-04 ENCOUNTER — Other Ambulatory Visit: Payer: Self-pay

## 2019-12-04 ENCOUNTER — Telehealth: Payer: Self-pay | Admitting: Cardiology

## 2019-12-04 MED ORDER — FUROSEMIDE 40 MG PO TABS
80.0000 mg | ORAL_TABLET | Freq: Two times a day (BID) | ORAL | 2 refills | Status: DC
Start: 2019-12-04 — End: 2020-08-08

## 2019-12-04 NOTE — Telephone Encounter (Signed)
    *  STAT* If patient is at the pharmacy, call can be transferred to refill team.   1. Which medications need to be refilled? (please list name of each medication and dose if known)   furosemide (LASIX) 40 MG tablet    2. Which pharmacy/location (including street and city if local pharmacy) is medication to be sent to? [facility] [place] [address] [facility] Portal to [facility]istered [facility]  3. Do they need a 30 day or 90 day supply? 90 days (360 tablets)

## 2019-12-08 ENCOUNTER — Ambulatory Visit (INDEPENDENT_AMBULATORY_CARE_PROVIDER_SITE_OTHER): Payer: BC Managed Care – PPO | Admitting: Psychology

## 2019-12-08 DIAGNOSIS — F319 Bipolar disorder, unspecified: Secondary | ICD-10-CM | POA: Diagnosis not present

## 2019-12-15 ENCOUNTER — Ambulatory Visit (INDEPENDENT_AMBULATORY_CARE_PROVIDER_SITE_OTHER): Payer: BC Managed Care – PPO | Admitting: Psychology

## 2019-12-15 DIAGNOSIS — F319 Bipolar disorder, unspecified: Secondary | ICD-10-CM | POA: Diagnosis not present

## 2019-12-22 ENCOUNTER — Ambulatory Visit (INDEPENDENT_AMBULATORY_CARE_PROVIDER_SITE_OTHER): Payer: BC Managed Care – PPO | Admitting: Psychology

## 2019-12-22 DIAGNOSIS — F319 Bipolar disorder, unspecified: Secondary | ICD-10-CM

## 2019-12-29 ENCOUNTER — Ambulatory Visit (INDEPENDENT_AMBULATORY_CARE_PROVIDER_SITE_OTHER): Payer: BC Managed Care – PPO | Admitting: Psychology

## 2019-12-29 DIAGNOSIS — F319 Bipolar disorder, unspecified: Secondary | ICD-10-CM

## 2020-01-05 ENCOUNTER — Ambulatory Visit (INDEPENDENT_AMBULATORY_CARE_PROVIDER_SITE_OTHER): Payer: BC Managed Care – PPO | Admitting: Psychology

## 2020-01-05 DIAGNOSIS — F319 Bipolar disorder, unspecified: Secondary | ICD-10-CM | POA: Diagnosis not present

## 2020-01-07 ENCOUNTER — Other Ambulatory Visit: Payer: Self-pay | Admitting: Cardiology

## 2020-01-07 ENCOUNTER — Other Ambulatory Visit: Payer: Self-pay | Admitting: Family Medicine

## 2020-01-11 LAB — HM DIABETES EYE EXAM

## 2020-01-12 ENCOUNTER — Ambulatory Visit (INDEPENDENT_AMBULATORY_CARE_PROVIDER_SITE_OTHER): Payer: BC Managed Care – PPO | Admitting: Psychology

## 2020-01-12 DIAGNOSIS — F319 Bipolar disorder, unspecified: Secondary | ICD-10-CM | POA: Diagnosis not present

## 2020-01-19 ENCOUNTER — Ambulatory Visit (INDEPENDENT_AMBULATORY_CARE_PROVIDER_SITE_OTHER): Payer: BC Managed Care – PPO | Admitting: Psychology

## 2020-01-19 DIAGNOSIS — F319 Bipolar disorder, unspecified: Secondary | ICD-10-CM | POA: Diagnosis not present

## 2020-01-26 ENCOUNTER — Ambulatory Visit (INDEPENDENT_AMBULATORY_CARE_PROVIDER_SITE_OTHER): Payer: BC Managed Care – PPO | Admitting: Psychology

## 2020-01-26 DIAGNOSIS — F319 Bipolar disorder, unspecified: Secondary | ICD-10-CM | POA: Diagnosis not present

## 2020-02-02 ENCOUNTER — Ambulatory Visit (INDEPENDENT_AMBULATORY_CARE_PROVIDER_SITE_OTHER): Payer: BC Managed Care – PPO | Admitting: Psychology

## 2020-02-02 DIAGNOSIS — F319 Bipolar disorder, unspecified: Secondary | ICD-10-CM | POA: Diagnosis not present

## 2020-02-09 ENCOUNTER — Ambulatory Visit (INDEPENDENT_AMBULATORY_CARE_PROVIDER_SITE_OTHER): Payer: BC Managed Care – PPO | Admitting: Psychology

## 2020-02-09 DIAGNOSIS — F319 Bipolar disorder, unspecified: Secondary | ICD-10-CM

## 2020-02-16 ENCOUNTER — Ambulatory Visit (INDEPENDENT_AMBULATORY_CARE_PROVIDER_SITE_OTHER): Payer: BC Managed Care – PPO | Admitting: Psychology

## 2020-02-16 DIAGNOSIS — F319 Bipolar disorder, unspecified: Secondary | ICD-10-CM | POA: Diagnosis not present

## 2020-02-23 ENCOUNTER — Ambulatory Visit (INDEPENDENT_AMBULATORY_CARE_PROVIDER_SITE_OTHER): Payer: BC Managed Care – PPO | Admitting: Psychology

## 2020-02-23 DIAGNOSIS — F319 Bipolar disorder, unspecified: Secondary | ICD-10-CM | POA: Diagnosis not present

## 2020-03-01 ENCOUNTER — Ambulatory Visit (INDEPENDENT_AMBULATORY_CARE_PROVIDER_SITE_OTHER): Payer: BC Managed Care – PPO | Admitting: Psychology

## 2020-03-01 DIAGNOSIS — F319 Bipolar disorder, unspecified: Secondary | ICD-10-CM | POA: Diagnosis not present

## 2020-03-08 ENCOUNTER — Ambulatory Visit (INDEPENDENT_AMBULATORY_CARE_PROVIDER_SITE_OTHER): Payer: BC Managed Care – PPO | Admitting: Psychology

## 2020-03-08 DIAGNOSIS — F319 Bipolar disorder, unspecified: Secondary | ICD-10-CM

## 2020-03-15 ENCOUNTER — Ambulatory Visit (INDEPENDENT_AMBULATORY_CARE_PROVIDER_SITE_OTHER): Payer: BC Managed Care – PPO | Admitting: Psychology

## 2020-03-15 DIAGNOSIS — F319 Bipolar disorder, unspecified: Secondary | ICD-10-CM

## 2020-03-22 ENCOUNTER — Ambulatory Visit (INDEPENDENT_AMBULATORY_CARE_PROVIDER_SITE_OTHER): Payer: BC Managed Care – PPO | Admitting: Psychology

## 2020-03-22 DIAGNOSIS — F319 Bipolar disorder, unspecified: Secondary | ICD-10-CM

## 2020-03-23 ENCOUNTER — Other Ambulatory Visit: Payer: Self-pay

## 2020-03-23 ENCOUNTER — Ambulatory Visit (HOSPITAL_COMMUNITY): Payer: BC Managed Care – PPO | Attending: Cardiology

## 2020-03-23 DIAGNOSIS — I272 Pulmonary hypertension, unspecified: Secondary | ICD-10-CM | POA: Diagnosis not present

## 2020-03-23 LAB — ECHOCARDIOGRAM COMPLETE
Area-P 1/2: 3.65 cm2
S' Lateral: 3.6 cm

## 2020-03-25 NOTE — Progress Notes (Signed)
Subjective:   NASYA VINCENT is a 50 y.o. female who presents for Medicare Annual (Subsequent) preventive examination.  Virtual Visit via Video Note  I connected with [name] by a video enabled telemedicine application and verified that I am speaking with the correct person using two identifiers.  Location: Patient: Home Provider: Office Persons participating in the virtual visit: patient, provider   I discussed the limitations of evaluation and management by telemedicine and the availability of in person appointments. The patient expressed understanding and agreed to proceed.     [place]    Review of Systems    N/A  Cardiac Risk Factors include: diabetes mellitus     Objective:    Today's Vitals   03/28/20 1406  PainSc: 4    There is no height or weight on file to calculate BMI.  Advanced Directives 03/28/2020 05/17/2019 05/11/2019 11/24/2015 08/26/2015 06/23/2015 05/03/2015  Does Patient Have a Medical Advance Directive? Yes No No No No No No  Type of Paramedic of [place];Living will - - - - - -  Does patient want to make changes to medical advance directive? No - Patient declined - - - - - -  Copy of [place] in Chart? No - copy requested - - - - - -  Would patient like information on creating a medical advance directive? - - Yes (MAU/Ambulatory/Procedural Areas - Information given) (No Data) No - patient declined information - No - patient declined information  Pre-existing out of facility DNR order (yellow form or pink MOST form) - - - - - - -  Some encounter information is confidential and restricted. Go to Review Flowsheets activity to see all data.    Current Medications (verified) Outpatient Encounter Medications as of 03/28/2020  Medication Sig  . acetaminophen (TYLENOL) 500 MG tablet Take 1,000 mg by mouth every 6 (six) hours as needed for moderate pain.   [name] albuterol (VENTOLIN HFA) 108 (90  Base) MCG/ACT inhaler Inhale 2 puffs into the lungs every 6 (six) hours as needed for wheezing or shortness of breath.  . ALPRAZolam (XANAX) 1 MG tablet Take 1 mg by mouth 3 (three) times daily.  [name] amphetamine-dextroamphetamine (ADDERALL) 20 MG tablet Take 20 mg by mouth in the morning, at noon, in the evening, and at bedtime.   . Ascorbic Acid (VITAMIN C) 500 MG tablet Take 1,000 mg by mouth daily.  [name] aspirin 81 MG tablet Take 1 tablet (81 mg total) by mouth daily.  [name] atorvastatin (LIPITOR) 20 MG tablet Take 1 tablet (20 mg total) by mouth every evening.  [name] b complex vitamins tablet Take 1 tablet by mouth daily.  . Calcium Citrate (CITRACAL PO) Take 2 tablets by mouth daily.  . Cholecalciferol (VITAMIN D3) 5000 UNITS TABS Take 10,000 Units by mouth in the morning and at bedtime.  . Chromium Picolinate 500 MCG TABS Take 500 mcg by mouth daily.   . clonazePAM (KLONOPIN) 1 MG tablet Take 1 tablet (1 mg total) by mouth 3 (three) times daily. (Patient taking differently: Take 1 mg by mouth in the morning, at noon, in the evening, and at bedtime.)  . diclofenac Sodium (VOLTAREN) 1 % GEL Apply 2 g topically 3 (three) times daily as needed (knee pain).  . furosemide (LASIX) 40 MG tablet Take 2 tablets (80 mg total) by mouth 2 (two) times daily.  [name] [name] 1.5/30 1.5-30 MG-MCG tablet Take 1 tablet by mouth at bedtime.  [name] KLOR-CON  M20 20 MEQ tablet TAKE 1 TABLET DAILY  . lamoTRIgine (LAMICTAL) 200 MG tablet Take 200 mg by mouth 2 (two) times daily.  [name] lidocaine (LIDODERM) 5 % Place 1 patch onto the skin daily as needed (for knee pain). Remove & Discard patch within 12 hours or as directed by MD  . losartan (COZAAR) 25 MG tablet TAKE 1 TABLET TWICE A DAY  . LYRICA 300 MG capsule Take 300 mg by mouth 2 (two) times daily.   [name] MAGNESIUM PO Take 500 mg by mouth daily.   . metFORMIN (GLUCOPHAGE) 500 MG tablet TAKE 1 TABLET TWICE DAILY  WITH MEALS  . methocarbamol (ROBAXIN-750) 750 MG tablet Take 1 tablet (750  mg total) by mouth 3 (three) times daily as needed for muscle spasms.  . Multiple Vitamin (MULTIVITAMIN) tablet Take 1 tablet by mouth daily.  [name] NUEDEXTA 20-10 MG CAPS Take 1 capsule by mouth every 12 (twelve) hours.   . Omega-3 Fatty Acids (FISH OIL) 1200 MG CAPS Take 2,400 mg by mouth daily.   [name] VRAYLAR 6 MG CAPS Take 6 mg by mouth every evening.   . Zinc 50 MG CAPS Take 50 mg by mouth daily.   No facility-administered encounter medications on file as of 03/28/2020.    Allergies (verified) Savella [milnacipran hcl]   History: Past Medical History:  Diagnosis Date  . Acute systolic congestive heart failure ([place])   . Agoraphobia   . Anxiety    panic attacks  . Arthritis    osteoarthritis bilal. knees  . Asthma    no per PFT 7/13; reports does not have asthma  . Bipolar disorder ([place])   . Carpal tunnel syndrome of left wrist 05/2011   Being evaluated for MS  . Cor pulmonale ([place])   . Depression   . History of pleural effusion   . Hyperlipidemia   . Hypertension   . Hypoxemia    history of - no home O2  . IBS (irritable bowel syndrome)   . Morbid obesity ([place])   . Obesity hypoventilation syndrome ([place])   . Pneumonia   . Pre-diabetes   . Prediabetes   . Seasonal allergies    current runny nose  . Seizures ([place])    febrile seizure x 1 as a child. Several times  . Sleep apnea sleep study 07/02/2010   uses CPAP nightly  . Spinal stenosis in cervical region   . Urinary incontinence    Past Surgical History:  Procedure Laterality Date  . APPLICATION OF INTRAOPERATIVE CT SCAN N/A 05/14/2019   Procedure: APPLICATION OF INTRAOPERATIVE CT SCAN;  Surgeon: Erline Levine, MD;  Location: [facility];  Service: Neurosurgery;  Laterality: N/A;  . CARPAL TUNNEL RELEASE  06/27/2011   Procedure: CARPAL TUNNEL RELEASE;  Surgeon: Wynonia Sours, MD;  Location: [facility];  Service: Orthopedics;  Laterality: Left;  . CARPAL TUNNEL RELEASE  12/19/2011   Procedure: CARPAL TUNNEL  RELEASE;  Surgeon: Wynonia Sours, MD;  Location: [facility];  Service: Orthopedics;  Laterality: Right;  . INCISION AND DRAINAGE ABSCESS Left 08/27/2017   Procedure: INCISION AND DRAINAGE LEFT BUTTOCK  ABSCESS;  Surgeon: Excell Seltzer, MD;  Location[facility];  Service: General;  Laterality: Left;  . IRRIGATION AND DEBRIDEMENT BUTTOCKS Left 08/30/2017   Procedure: IRRIGATION AND DEBRIDEMENT RE-EXCISION OF LEFT SUBCUTANEOUS BUTTOCKS ABCESS;  Surgeon: Kinsinger, Arta Bruce, MD;  Location: [facility];  Service: General;  Laterality: Left;  [name] MASS EXCISION  08/07/2010  right index  . POSTERIOR CERVICAL FUSION/FORAMINOTOMY  09/11/2011   Procedure: POSTERIOR CERVICAL FUSION/FORAMINOTOMY LEVEL 1;  Surgeon: Erline Levine, MD;  Location: [facility];  Service: Neurosurgery;  Laterality: N/A;  Cervical Three-Four Posteior Cervical Fusion and Decompression.  [name] POSTERIOR CERVICAL FUSION/FORAMINOTOMY N/A 05/14/2019   Procedure: Posterior cervical decompression/fusion Cervical four to Thoracic one with exploration/revision of Cervical threee-four fusion;  Surgeon: Erline Levine, MD;  Location: [facility];  Service: Neurosurgery;  Laterality: N/A;  . TRIGGER FINGER RELEASE  12/19/2011   Procedure: RELEASE TRIGGER FINGER/A-1 PULLEY;  Surgeon: Wynonia Sours, MD;  Location: [facility];  Service: Orthopedics;  Laterality: Right;  . TRIGGER FINGER RELEASE Left 12/31/2012   Procedure: RELEASE A-1 PULLEY LEFT THUMB;  Surgeon: Wynonia Sours, MD;  Location: [facility];  Service: Orthopedics;  Laterality: Left;  . WISDOM TOOTH EXTRACTION     Family History  Problem Relation Age of Onset  . Asthma Mother   . Allergic rhinitis Mother   . Multiple sclerosis Mother   . Asthma Maternal Grandmother    Social History   Socioeconomic History  . Marital status: Married    Spouse name: Not on file  . Number of children: Not on file  . Years of education: Not on file  . Highest education  level: Not on file  Occupational History  . Occupation: currently unemployed  Tobacco Use  . Smoking status: Never Smoker  . Smokeless tobacco: Never Used  Substance and Sexual Activity  . Alcohol use: Yes    Comment: rare  . Drug use: No  . Sexual activity: Not on file  Other Topics Concern  . Not on file  Social History Narrative   Lives in [place]         Social Determinants of Health   Financial Resource Strain: Low Risk   . Difficulty of Paying Living Expenses: Not hard at all  Food Insecurity: No Food Insecurity  . Worried About Charity fundraiser in the Last Year: Never true  . Ran Out of Food in the Last Year: Never true  Transportation Needs: No Transportation Needs  . Lack of Transportation (Medical): No  . Lack of Transportation (Non-Medical): No  Physical Activity: Inactive  . Days of Exercise per Week: 0 days  . Minutes of Exercise per Session: 0 min  Stress: No Stress Concern Present  . Feeling of Stress : Not at all  Social Connections: Moderately Isolated  . Frequency of Communication with Friends and Family: More than three times a week  . Frequency of Social Gatherings with Friends and Family: Once a week  . Attends Religious Services: Never  . Active Member of Clubs or Organizations: No  . Attends Archivist Meetings: Never  . Marital Status: Married    Tobacco Counseling Counseling given: Not Answered   Clinical Intake:  Pre-visit preparation completed: Yes  Pain : 0-10 Pain Score: 4  Pain Type: Chronic pain Pain Location: Neck Pain Descriptors / Indicators: Aching Pain Onset: More than a month ago Pain Frequency: Constant Pain Relieving Factors: Robaxin,Tylenol, ibuprofen  Pain Relieving Factors: Robaxin,Tylenol, ibuprofen  Nutritional Risks: None Diabetes: Yes CBG done?: No Did pt. bring in CBG monitor from home?: No  What is the last grade level you completed in school?: Bachelors Degree  Diabetic? Yes Nutrition  Risk Assessment:  Has the patient had any N/V/D within the last 2 months?  No  Does the patient have any non-healing wounds?  No  Has the patient had any unintentional weight loss or weight gain?  No   Diabetes:  Is the patient diabetic?  Yes  If diabetic, was a CBG obtained today?  No  Did the patient bring in their glucometer from home?  No  How often do you monitor your CBG's? Patient does not check glucose at home .   Financial Strains and Diabetes Management:  Are you having any financial strains with the device, your supplies or your medication? No .  Does the patient want to be seen by Chronic Care Management for management of their diabetes?  No  Would the patient like to be referred to a Nutritionist or for Diabetic Management?  No   Diabetic Exams:  Diabetic Eye Exam: Overdue for diabetic eye exam. Pt has been advised about the importance in completing this exam. Patient advised to call and schedule an eye exam. Diabetic Foot Exam: Overdue, Pt has been advised about the importance in completing this exam. Pt is scheduled for diabetic foot exam on 04/06/2020.   Interpreter Needed?: No  Information entered by :: [place] of Daily Living In your present state of health, do you have any difficulty performing the following activities: 03/28/2020 05/17/2019  Hearing? N -  Vision? N -  Difficulty concentrating or making decisions? - -  Walking or climbing stairs? Y -  Comment has sob, and pain -  Dressing or bathing? N -  Doing errands, shopping? N N  Preparing Food and eating ? N -  Using the Toilet? N -  In the past six months, have you accidently leaked urine? N -  Do you have problems with loss of bowel control? N -  Managing your Medications? N -  Managing your Finances? N -  Housekeeping or managing your Housekeeping? N -  Some recent data might be hidden    Patient Care Team: [place], Betty G, MD as PCP - General (Family Medicine) Minus Breeding, MD as PCP - Cardiology (Cardiology)  Indicate any recent Medical Services you may have received from other than [facility] providers in the past year (date may be approximate).     Assessment:   This is a routine wellness examination for [facility].  Hearing/Vision screen  Hearing Screening   125Hz  250Hz  500Hz  1000Hz  2000Hz  3000Hz  4000Hz  6000Hz  8000Hz   Right ear:           Left ear:           Vision Screening Comments: Patient states gets eyes examined once per year. Wears contacts   Dietary issues and exercise activities discussed: Current Exercise Habits: The patient does not participate in regular exercise at present, Exercise limited by: orthopedic condition(s)  Goals    . Patient Stated     Continue to learn how to play the drums     . Weight (lb) < 200 lb (90.7 kg)      Depression Screen PHQ 2/9 Scores 03/28/2020  PHQ - 2 Score 3  PHQ- 9 Score 12    Fall Risk Fall Risk  03/28/2020 11/24/2015  Falls in the past year? 0 Yes  Number falls in past yr: 0 2 or more  Injury with Fall? 0 -  Risk for fall due to : No Fall Risks Impaired balance/gait  Risk for fall due to: Comment - keep exercising  Follow up Falls evaluation completed;Falls prevention discussed Education provided    FALL RISK PREVENTION PERTAINING TO THE HOME:  Any stairs in  or around the home? No  If so, are there any without handrails? No  Home free of loose throw rugs in walkways, pet beds, electrical cords, etc? Yes  Adequate lighting in your home to reduce risk of falls? Yes   ASSISTIVE DEVICES UTILIZED TO PREVENT FALLS:  Life alert? No  Use of a cane, walker or w/c? No  Grab bars in the bathroom? No  Shower chair or bench in shower? No  Elevated toilet seat or a handicapped toilet? No    Cognitive Function: Normal cognitive status assessed by direct observation by this Nurse Health Advisor. No abnormalities found.   MMSE - Mini Mental State Exam 11/24/2015  Not completed: (No Data)         Immunizations Immunization History  Administered Date(s) Administered  . PFIZER(Purple Top)SARS-COV-2 Vaccination 04/30/2019, 05/28/2019, 11/30/2019  . Tdap 03/18/2014    TDAP status: Up to date  Flu Vaccine status: Due, Education has been provided regarding the importance of this vaccine. Advised may receive this vaccine at local pharmacy or Health Dept. Aware to provide a copy of the vaccination record if obtained from local pharmacy or Health Dept. Verbalized acceptance and understanding.  Pneumococcal vaccine status: Due, Education has been provided regarding the importance of this vaccine. Advised may receive this vaccine at local pharmacy or Health Dept. Aware to provide a copy of the vaccination record if obtained from local pharmacy or Health Dept. Verbalized acceptance and understanding.  Covid-19 vaccine status: Completed vaccines  Qualifies for Shingles Vaccine? Yes   Zostavax completed No   Shingrix Completed?: No.    Education has been provided regarding the importance of this vaccine. Patient has been advised to call insurance company to determine out of pocket expense if they have not yet received this vaccine. Advised may also receive vaccine at local pharmacy or Health Dept. Verbalized acceptance and understanding.  Screening Tests Health Maintenance  Topic Date Due  . Hepatitis C Screening  Never done  . PNEUMOCOCCAL POLYSACCHARIDE VACCINE AGE 6-64 HIGH RISK  Never done  . COLONOSCOPY (Pts 45-43yrs Insurance coverage will need to be confirmed)  Never done  . OPHTHALMOLOGY EXAM  09/05/2017  . FOOT EXAM  07/12/2018  . PAP SMEAR-Modifier  10/26/2018  . INFLUENZA VACCINE  Never done  . HEMOGLOBIN A1C  11/10/2019  . MAMMOGRAM  11/20/2019  . COVID-19 Vaccine (4 - Booster for Pfizer series) 05/29/2020  . TETANUS/TDAP  03/18/2024  . HIV Screening  Completed    Health Maintenance  Health Maintenance Due  Topic Date Due  . Hepatitis C Screening  Never done  .  PNEUMOCOCCAL POLYSACCHARIDE VACCINE AGE 6-64 HIGH RISK  Never done  . COLONOSCOPY (Pts 45-63yrs Insurance coverage will need to be confirmed)  Never done  . OPHTHALMOLOGY EXAM  09/05/2017  . FOOT EXAM  07/12/2018  . PAP SMEAR-Modifier  10/26/2018  . INFLUENZA VACCINE  Never done  . HEMOGLOBIN A1C  11/10/2019  . MAMMOGRAM  11/20/2019    Colorectal cancer screening: Referral to GI placed 03/28/2020. Pt aware the office will call re: appt.  Mammogram status: Ordered 03/28/2020. Pt provided with contact info and advised to call to schedule appt.   Bone Density Status: Not due at this age   Lung Cancer Screening: (Low Dose CT Chest recommended if Age 30-80 years, 30 pack-year currently smoking OR have quit w/in 15years.) does not qualify.   Lung Cancer Screening Referral: N/A   Additional Screening:  Hepatitis C Screening: does qualify;   Vision  Screening: Recommended annual ophthalmology exams for early detection of glaucoma and other disorders of the eye. Is the patient up to date with their annual eye exam?  Yes  Who is the provider or what is the name of the office in which the patient attends annual eye exams? Dr. Charma Igo  If pt is not established with a provider, would they like to be referred to a provider to establish care? No .   Dental Screening: Recommended annual dental exams for proper oral hygiene  Community Resource Referral / Chronic Care Management: CRR required this visit?  No   CCM required this visit?  No      Plan:     I have personally reviewed and noted the following in the patient's chart:   . Medical and social history . Use of alcohol, tobacco or illicit drugs  . Current medications and supplements . Functional ability and status . Nutritional status . Physical activity . Advanced directives . List of other physicians . Hospitalizations, surgeries, and ER visits in previous 12 months . Vitals . Screenings to include cognitive,  depression, and falls . Referrals and appointments  In addition, I have reviewed and discussed with patient certain preventive protocols, quality metrics, and best practice recommendations. A written personalized care plan for preventive services as well as general preventive health recommendations were provided to patient.     Ofilia Neas, LPN   [id number]/44/[id number]   Nurse Notes: None

## 2020-03-28 ENCOUNTER — Ambulatory Visit (INDEPENDENT_AMBULATORY_CARE_PROVIDER_SITE_OTHER): Payer: BC Managed Care – PPO

## 2020-03-28 DIAGNOSIS — Z1231 Encounter for screening mammogram for malignant neoplasm of breast: Secondary | ICD-10-CM | POA: Diagnosis not present

## 2020-03-28 DIAGNOSIS — Z0000 Encounter for general adult medical examination without abnormal findings: Secondary | ICD-10-CM

## 2020-03-28 NOTE — Patient Instructions (Addendum)
[name] , Thank you for taking time to come for your Medicare Wellness Visit. I appreciate your ongoing commitment to your health goals. Please review the following plan we discussed and let me know if I can assist you in the future.   Screening recommendations/referrals: Colonoscopy: Currently due, we will get cologuard ordered for you  Mammogram: Currently due orders placed this visit. Bone Density: Not due until age 51 Recommended yearly ophthalmology/optometry visit for glaucoma screening and checkup Recommended yearly dental visit for hygiene and checkup  Vaccinations: Influenza vaccine: Currently due for influenza vaccine. You may receive in our office or at your local pharmacy. Pneumococcal vaccine: Not due until age 51 unless recommended by your Doctor  Tdap vaccine: Up to date, next due 03/18/2024 Shingles vaccine: Currently due for Shingrix, if you would like to receive we recommend that you do so at your local pharmacy as it is less expensive.    Advanced directives: Please bring copies of your advanced directives such as your living will and advanced medical directives into our office so that we may scan them into your chart.   Conditions/risks identified: None   Next appointment: 04/06/2020 @ 2:30 PM with Dr. Martinique  Preventive Care 40-64 Years, Female Preventive care refers to lifestyle choices and visits with your health care provider that can promote health and wellness. What does preventive care include?  A yearly physical exam. This is also called an annual well check.  Dental exams once or twice a year.  Routine eye exams. Ask your health care provider how often you should have your eyes checked.  Personal lifestyle choices, including:  Daily care of your teeth and gums.  Regular physical activity.  Eating a healthy diet.  Avoiding tobacco and drug use.  Limiting alcohol use.  Practicing safe sex.  Taking low-dose aspirin daily starting at age  51.  Taking vitamin and mineral supplements as recommended by your health care provider. What happens during an annual well check? The services and screenings done by your health care provider during your annual well check will depend on your age, overall health, lifestyle risk factors, and family history of disease. Counseling  Your health care provider may ask you questions about your:  Alcohol use.  Tobacco use.  Drug use.  Emotional well-being.  Home and relationship well-being.  Sexual activity.  Eating habits.  Work and work Statistician.  Method of birth control.  Menstrual cycle.  Pregnancy history. Screening  You may have the following tests or measurements:  Height, weight, and BMI.  Blood pressure.  Lipid and cholesterol levels. These may be checked every 5 years, or more frequently if you are over 1 years old.  Skin check.  Lung cancer screening. You may have this screening every year starting at age 51 if you have a 30-pack-year history of smoking and currently smoke or have quit within the past 15 years.  Fecal occult blood test (FOBT) of the stool. You may have this test every year starting at age 51.  Flexible sigmoidoscopy or colonoscopy. You may have a sigmoidoscopy every 5 years or a colonoscopy every 10 years starting at age 51.  Hepatitis C blood test.  Hepatitis B blood test.  Sexually transmitted disease (STD) testing.  Diabetes screening. This is done by checking your blood sugar (glucose) after you have not eaten for a while (fasting). You may have this done every 1-3 years.  Mammogram. This may be done every 1-2 years. Talk to your health care  provider about when you should start having regular mammograms. This may depend on whether you have a family history of breast cancer.  BRCA-related cancer screening. This may be done if you have a family history of breast, ovarian, tubal, or peritoneal cancers.  Pelvic exam and Pap test. This  may be done every 3 years starting at age 51. Starting at age 100, this may be done every 5 years if you have a Pap test in combination with an HPV test.  Bone density scan. This is done to screen for osteoporosis. You may have this scan if you are at high risk for osteoporosis. Discuss your test results, treatment options, and if necessary, the need for more tests with your health care provider. Vaccines  Your health care provider may recommend certain vaccines, such as:  Influenza vaccine. This is recommended every year.  Tetanus, diphtheria, and acellular pertussis (Tdap, Td) vaccine. You may need a Td booster every 10 years.  Zoster vaccine. You may need this after age 51  Pneumococcal 13-valent conjugate (PCV13) vaccine. You may need this if you have certain conditions and were not previously vaccinated.  Pneumococcal polysaccharide (PPSV23) vaccine. You may need one or two doses if you smoke cigarettes or if you have certain conditions. Talk to your health care provider about which screenings and vaccines you need and how often you need them. This information is not intended to replace advice given to you by your health care provider. Make sure you discuss any questions you have with your health care provider. Document Released: 02/11/2015 Document Revised: 10/05/2015 Document Reviewed: 11/16/2014 Elsevier Interactive Patient Education  2017 [place] Prevention in the Home Falls can cause injuries. They can happen to people of all ages. There are many things you can do to make your home safe and to help prevent falls. What can I do on the outside of my home?  Regularly fix the edges of walkways and driveways and fix any cracks.  Remove anything that might make you trip as you walk through a door, such as a raised step or threshold.  Trim any bushes or trees on the path to your home.  Use bright outdoor lighting.  Clear any walking paths of anything that might  make someone trip, such as rocks or tools.  Regularly check to see if handrails are loose or broken. Make sure that both sides of any steps have handrails.  Any raised decks and porches should have guardrails on the edges.  Have any leaves, snow, or ice cleared regularly.  Use sand or salt on walking paths during winter.  Clean up any spills in your garage right away. This includes oil or grease spills. What can I do in the bathroom?  Use night lights.  Install grab bars by the toilet and in the tub and shower. Do not use towel bars as grab bars.  Use non-skid mats or decals in the tub or shower.  If you need to sit down in the shower, use a plastic, non-slip stool.  Keep the floor dry. Clean up any water that spills on the floor as soon as it happens.  Remove soap buildup in the tub or shower regularly.  Attach bath mats securely with double-sided non-slip rug tape.  Do not have throw rugs and other things on the floor that can make you trip. What can I do in the bedroom?  Use night lights.  Make sure that you have a light by  your bed that is easy to reach.  Do not use any sheets or blankets that are too big for your bed. They should not hang down onto the floor.  Have a firm chair that has side arms. You can use this for support while you get dressed.  Do not have throw rugs and other things on the floor that can make you trip. What can I do in the kitchen?  Clean up any spills right away.  Avoid walking on wet floors.  Keep items that you use a lot in easy-to-reach places.  If you need to reach something above you, use a strong step stool that has a grab bar.  Keep electrical cords out of the way.  Do not use floor polish or wax that makes floors slippery. If you must use wax, use non-skid floor wax.  Do not have throw rugs and other things on the floor that can make you trip. What can I do with my stairs?  Do not leave any items on the stairs.  Make sure  that there are handrails on both sides of the stairs and use them. Fix handrails that are broken or loose. Make sure that handrails are as long as the stairways.  Check any carpeting to make sure that it is firmly attached to the stairs. Fix any carpet that is loose or worn.  Avoid having throw rugs at the top or bottom of the stairs. If you do have throw rugs, attach them to the floor with carpet tape.  Make sure that you have a light switch at the top of the stairs and the bottom of the stairs. If you do not have them, ask someone to add them for you. What else can I do to help prevent falls?  Wear shoes that:  Do not have high heels.  Have rubber bottoms.  Are comfortable and fit you well.  Are closed at the toe. Do not wear sandals.  If you use a stepladder:  Make sure that it is fully opened. Do not climb a closed stepladder.  Make sure that both sides of the stepladder are locked into place.  Ask someone to hold it for you, if possible.  Clearly mark and make sure that you can see:  Any grab bars or handrails.  First and last steps.  Where the edge of each step is.  Use tools that help you move around (mobility aids) if they are needed. These include:  Canes.  Walkers.  Scooters.  Crutches.  Turn on the lights when you go into a dark area. Replace any light bulbs as soon as they burn out.  Set up your furniture so you have a clear path. Avoid moving your furniture around.  If any of your floors are uneven, fix them.  If there are any pets around you, be aware of where they are.  Review your medicines with your doctor. Some medicines can make you feel dizzy. This can increase your chance of falling. Ask your doctor what other things that you can do to help prevent falls. This information is not intended to replace advice given to you by your health care provider. Make sure you discuss any questions you have with your health care provider. Document  Released: 11/11/2008 Document Revised: 06/23/2015 Document Reviewed: 02/19/2014 Elsevier Interactive Patient Education  2017 [employer].

## 2020-03-29 ENCOUNTER — Ambulatory Visit (INDEPENDENT_AMBULATORY_CARE_PROVIDER_SITE_OTHER): Payer: BC Managed Care – PPO | Admitting: Psychology

## 2020-03-29 DIAGNOSIS — F319 Bipolar disorder, unspecified: Secondary | ICD-10-CM

## 2020-04-05 ENCOUNTER — Ambulatory Visit (INDEPENDENT_AMBULATORY_CARE_PROVIDER_SITE_OTHER): Payer: BC Managed Care – PPO | Admitting: Psychology

## 2020-04-05 DIAGNOSIS — F319 Bipolar disorder, unspecified: Secondary | ICD-10-CM | POA: Diagnosis not present

## 2020-04-06 ENCOUNTER — Telehealth (INDEPENDENT_AMBULATORY_CARE_PROVIDER_SITE_OTHER): Payer: BC Managed Care – PPO | Admitting: Family Medicine

## 2020-04-06 ENCOUNTER — Encounter: Payer: Self-pay | Admitting: Family Medicine

## 2020-04-06 VITALS — BP 131/83 | HR 72 | Ht 65.0 in | Wt 335.0 lb

## 2020-04-06 DIAGNOSIS — E785 Hyperlipidemia, unspecified: Secondary | ICD-10-CM | POA: Diagnosis not present

## 2020-04-06 DIAGNOSIS — E1169 Type 2 diabetes mellitus with other specified complication: Secondary | ICD-10-CM | POA: Diagnosis not present

## 2020-04-06 DIAGNOSIS — I10 Essential (primary) hypertension: Secondary | ICD-10-CM

## 2020-04-06 DIAGNOSIS — Z1159 Encounter for screening for other viral diseases: Secondary | ICD-10-CM

## 2020-04-06 NOTE — Progress Notes (Signed)
Virtual Visit via Video Note I connected with [name] on 04/06/20 by a video enabled telemedicine application and verified that I am speaking with the correct person using two identifiers.  Location patient: home Location provider:work office Persons participating in the virtual visit: patient, provider  I discussed the limitations of evaluation and management by telemedicine and the availability of in person appointments. The patient expressed understanding and agreed to proceed.   HPI: [name] is a 50 yo female with hx Bipolar disorder,OHS,HTN,HLD, cardiomyopathy, and cervical myelopathy following on some of her chronic medical problems. She was last seen 10/08/18. Since her last visit she has been following with neurosurgeon, pulmonologist, and cardiologist.  Hyperlipidemia: Currently she is on atorvastatin 20 mg daily.  Lab Results  Component Value Date   CHOL 159 07/11/2017   HDL 63.80 07/11/2017   LDLCALC 76 07/11/2017   TRIG 98.0 07/11/2017   CHOLHDL 2 07/11/2017   Hypertension: Currently she is on losartan 25 mg daily. Echo time on 03/23/2020 showed LVEF 60 to 65% and grade 1 diastolic dysfunction. Negative for unusual/frequent headache, visual changes, CP, dyspnea, palpitations, focal neurologic deficit, or unusual edema. No orthopnea or PND. OSA and obesity hypoventilation syndrome following with pulmonologist. She is not exercising regularly.  She is doing PT for back and shoulder pain as well as cervicalgia. She is still trying to follow a healthful diet, she does intermittent fasting.  Lab Results  Component Value Date   CREATININE 1.00 05/14/2019   BUN 18 05/14/2019   NA 136 05/14/2019   K 4.9 05/14/2019   CL 100 05/14/2019   CO2 24 05/11/2019   DM2: Dx'ed on 12/11/13. She has been checking BS. Currently she is on Metformin 500 mg twice daily. Negative for abdominal pain, nausea,vomiting, polydipsia,polyuria, or polyphagia. She has not noted feet  numbness, tingling, or burning sensation.  Lab Results  Component Value Date   HGBA1C 5.9 (H) 05/11/2019   Lab Results  Component Value Date   ALT 22 08/27/2017   AST 21 08/27/2017   ALKPHOS 109 08/27/2017   BILITOT 0.6 08/27/2017   ROS: See pertinent positives and negatives per HPI.  Past Medical History:  Diagnosis Date  . Acute systolic congestive heart failure ([place])   . Agoraphobia   . Anxiety    panic attacks  . Arthritis    osteoarthritis bilal. knees  . Asthma    no per PFT 7/13; reports does not have asthma  . Bipolar disorder ([place])   . Carpal tunnel syndrome of left wrist 05/2011   Being evaluated for MS  . Cor pulmonale ([place])   . Depression   . History of pleural effusion   . Hyperlipidemia   . Hypertension   . Hypoxemia    history of - no home O2  . IBS (irritable bowel syndrome)   . Morbid obesity ([place])   . Obesity hypoventilation syndrome ([place])   . Pneumonia   . Pre-diabetes   . Prediabetes   . Seasonal allergies    current runny nose  . Seizures ([place])    febrile seizure x 1 as a child. Several times  . Sleep apnea sleep study 07/02/2010   uses CPAP nightly  . Spinal stenosis in cervical region   . Urinary incontinence     Past Surgical History:  Procedure Laterality Date  . APPLICATION OF INTRAOPERATIVE CT SCAN N/A 05/14/2019   Procedure: APPLICATION OF INTRAOPERATIVE CT SCAN;  Surgeon: Erline Levine, MD;  Location: [facility];  Service:  Neurosurgery;  Laterality: N/A;  . CARPAL TUNNEL RELEASE  06/27/2011   Procedure: CARPAL TUNNEL RELEASE;  Surgeon: Wynonia Sours, MD;  Location: [facility];  Service: Orthopedics;  Laterality: Left;  . CARPAL TUNNEL RELEASE  12/19/2011   Procedure: CARPAL TUNNEL RELEASE;  Surgeon: Wynonia Sours, MD;  Location: [facility];  Service: Orthopedics;  Laterality: Right;  . INCISION AND DRAINAGE ABSCESS Left 08/27/2017   Procedure: INCISION AND DRAINAGE LEFT BUTTOCK  ABSCESS;  Surgeon: Excell Seltzer, MD;  Location: [facility];  Service: General;  Laterality: Left;  . IRRIGATION AND DEBRIDEMENT BUTTOCKS Left 08/30/2017   Procedure: IRRIGATION AND DEBRIDEMENT RE-EXCISION OF LEFT SUBCUTANEOUS BUTTOCKS ABCESS;  Surgeon: Kinsinger, Arta Bruce, MD;  Location: [facility];  Service: General;  Laterality: Left;  [name] MASS EXCISION  08/07/2010   right index  . POSTERIOR CERVICAL FUSION/FORAMINOTOMY  09/11/2011   Procedure: POSTERIOR CERVICAL FUSION/FORAMINOTOMY LEVEL 1;  Surgeon: Erline Levine, MD;  Location: [facility];  Service: Neurosurgery;  Laterality: N/A;  Cervical Three-Four Posteior Cervical Fusion and Decompression.  [name] POSTERIOR CERVICAL FUSION/FORAMINOTOMY N/A 05/14/2019   Procedure: Posterior cervical decompression/fusion Cervical four to Thoracic one with exploration/revision of Cervical threee-four fusion;  Surgeon: Erline Levine, MD;  Location: [facility];  Service: Neurosurgery;  Laterality: N/A;  . TRIGGER FINGER RELEASE  12/19/2011   Procedure: RELEASE TRIGGER FINGER/A-1 PULLEY;  Surgeon: Wynonia Sours, MD;  Location: [facility];  Service: Orthopedics;  Laterality: Right;  . TRIGGER FINGER RELEASE Left 12/31/2012   Procedure: RELEASE A-1 PULLEY LEFT THUMB;  Surgeon: Wynonia Sours, MD;  Location: [facility];  Service: Orthopedics;  Laterality: Left;  . WISDOM TOOTH EXTRACTION      Family History  Problem Relation Age of Onset  . Asthma Mother   . Allergic rhinitis Mother   . Multiple sclerosis Mother   . Asthma Maternal Grandmother     Social History   Socioeconomic History  . Marital status: Married    Spouse name: Not on file  . Number of children: Not on file  . Years of education: Not on file  . Highest education level: Not on file  Occupational History  . Occupation: currently unemployed  Tobacco Use  . Smoking status: Never Smoker  . Smokeless tobacco: Never Used  Substance and Sexual Activity  . Alcohol use: Yes    Comment: rare  . Drug use:  No  . Sexual activity: Not on file  Other Topics Concern  . Not on file  Social History Narrative   Lives in [place]         Social Determinants of Health   Financial Resource Strain: Low Risk   . Difficulty of Paying Living Expenses: Not hard at all  Food Insecurity: No Food Insecurity  . Worried About Charity fundraiser in the Last Year: Never true  . Ran Out of Food in the Last Year: Never true  Transportation Needs: No Transportation Needs  . Lack of Transportation (Medical): No  . Lack of Transportation (Non-Medical): No  Physical Activity: Inactive  . Days of Exercise per Week: 0 days  . Minutes of Exercise per Session: 0 min  Stress: No Stress Concern Present  . Feeling of Stress : Not at all  Social Connections: Moderately Isolated  . Frequency of Communication with Friends and Family: More than three times a week  . Frequency of Social Gatherings with Friends and Family: Once a week  .  Attends Religious Services: Never  . Active Member of Clubs or Organizations: No  . Attends Archivist Meetings: Never  . Marital Status: Married  Human resources officer Violence: Not At Risk  . Fear of Current or Ex-Partner: No  . Emotionally Abused: No  . Physically Abused: No  . Sexually Abused: No    Current Outpatient Medications:  .  acetaminophen (TYLENOL) 500 MG tablet, Take 1,000 mg by mouth every 6 (six) hours as needed for moderate pain. , Disp: , Rfl:  .  albuterol (VENTOLIN HFA) 108 (90 Base) MCG/ACT inhaler, Inhale 2 puffs into the lungs every 6 (six) hours as needed for wheezing or shortness of breath., Disp: 8 g, Rfl: 1 .  ALPRAZolam (XANAX) 1 MG tablet, Take 1 mg by mouth 3 (three) times daily., Disp: , Rfl:  .  amphetamine-dextroamphetamine (ADDERALL) 20 MG tablet, Take 20 mg by mouth in the morning, at noon, in the evening, and at bedtime. , Disp: , Rfl:  .  Ascorbic Acid (VITAMIN C) 500 MG tablet, Take 1,000 mg by mouth daily., Disp: , Rfl:  .  aspirin 81  MG tablet, Take 1 tablet (81 mg total) by mouth daily., Disp: 30 tablet, Rfl:  .  atorvastatin (LIPITOR) 20 MG tablet, Take 1 tablet (20 mg total) by mouth every evening., Disp: 90 tablet, Rfl: 0 .  b complex vitamins tablet, Take 1 tablet by mouth daily., Disp: , Rfl:  .  Calcium Citrate (CITRACAL PO), Take 2 tablets by mouth daily., Disp: , Rfl:  .  Cholecalciferol (VITAMIN D3) 5000 UNITS TABS, Take 10,000 Units by mouth in the morning and at bedtime., Disp: , Rfl:  .  Chromium Picolinate 500 MCG TABS, Take 500 mcg by mouth daily. , Disp: , Rfl:  .  clonazePAM (KLONOPIN) 1 MG tablet, Take 1 tablet (1 mg total) by mouth 3 (three) times daily. (Patient taking differently: Take 1 mg by mouth in the morning, at noon, in the evening, and at bedtime.), Disp: 30 tablet, Rfl: 0 .  diclofenac Sodium (VOLTAREN) 1 % GEL, Apply 2 g topically 3 (three) times daily as needed (knee pain)., Disp: , Rfl:  .  furosemide (LASIX) 40 MG tablet, Take 2 tablets (80 mg total) by mouth 2 (two) times daily., Disp: 360 tablet, Rfl: 2 .  [name] 1.5/30 1.5-30 MG-MCG tablet, Take 1 tablet by mouth at bedtime., Disp: , Rfl:  .  KLOR-CON M20 20 MEQ tablet, TAKE 1 TABLET DAILY, Disp: 90 tablet, Rfl: 4 .  lamoTRIgine (LAMICTAL) 200 MG tablet, Take 200 mg by mouth 2 (two) times daily., Disp: , Rfl:  .  lidocaine (LIDODERM) 5 %, Place 1 patch onto the skin daily as needed (for knee pain). Remove & Discard patch within 12 hours or as directed by MD, Disp: 30 patch, Rfl: 0 .  losartan (COZAAR) 25 MG tablet, TAKE 1 TABLET TWICE A DAY, Disp: 180 tablet, Rfl: 3 .  LYRICA 300 MG capsule, Take 300 mg by mouth 2 (two) times daily. , Disp: , Rfl:  .  MAGNESIUM PO, Take 500 mg by mouth daily. , Disp: , Rfl:  .  metFORMIN (GLUCOPHAGE) 500 MG tablet, TAKE 1 TABLET TWICE DAILY  WITH MEALS, Disp: 180 tablet, Rfl: 1 .  methocarbamol (ROBAXIN-750) 750 MG tablet, Take 1 tablet (750 mg total) by mouth 3 (three) times daily as needed for muscle  spasms., Disp: 90 tablet, Rfl: 1 .  Multiple Vitamin (MULTIVITAMIN) tablet, Take 1 tablet by mouth daily.,  Disp: , Rfl:  .  NUEDEXTA 20-10 MG CAPS, Take 1 capsule by mouth every 12 (twelve) hours. , Disp: , Rfl: 12 .  Omega-3 Fatty Acids (FISH OIL) 1200 MG CAPS, Take 2,400 mg by mouth daily. , Disp: , Rfl:  .  VRAYLAR 6 MG CAPS, Take 6 mg by mouth every evening. , Disp: , Rfl:  .  Zinc 50 MG CAPS, Take 50 mg by mouth daily., Disp: , Rfl:   EXAM:  VITALS per patient if applicable: BP 037/04   Pulse 72   Ht 5\' 5"  (1.651 m)   Wt (!) 335 lb (152 kg)   SpO2 98%   BMI 55.75 kg/m   GENERAL: alert, oriented, appears well and in no acute distress  HEENT: atraumatic, conjunctiva clear, no obvious abnormalities on inspection.  NECK: normal movements of the head and neck  LUNGS: on inspection no signs of respiratory distress, breathing rate appears normal, no obvious gross SOB, gasping or wheezing  CV: no obvious cyanosis  MS: moves all visible extremities without noticeable abnormality  PSYCH/NEURO: pleasant and cooperative, no obvious depression or anxiety, speech and thought processing grossly intact  ASSESSMENT AND PLAN:  Discussed the following assessment and plan: Orders Placed This Encounter  Procedures  . Comprehensive metabolic panel  . Microalbumin / creatinine urine ratio  . Hemoglobin A1c  . Fructosamine  . Lipid panel  . Hepatitis C antibody     Type 2 diabetes mellitus with other specified complication ([place]) [id number] has been at goal. Continue Metformin 500 mg twice daily. We will arrange lab appointment. . Regular exercise and healthy diet with avoidance of added sugar food intake encouraged.  Continue annual eye exam, periodic dental and foot care. F/U in 5-6 months   Hyperlipidemia Continue atorvastatin 20 mg daily and low-fat diet. Further recommendation will be given according to lipid panel result.  Morbid obesity ([place]) She understands the benefits of  wt loss as well as adverse effects of obesity. Some of her medications and chronic medical problems can be contributing factors. Consistency with a healthful diet and physical activity is important.  Essential hypertension, benign BP adequately controlled. Continue losartan 25 mg daily. Continue monitoring BP regularly and low-salt diet. Eye exam is current.  Encounter for HCV screening test for low risk patient - Plan: Hepatitis C antibody  I discussed the assessment and treatment plan with the patient. [name] was provided an opportunity to ask questions and all were answered. She agreed with the plan and demonstrated an understanding of the instructions.  Return in about 6 months (around 10/07/2020) for Lab appt on 04/08/2020.   Jessica Folmer Martinique, MD

## 2020-04-06 NOTE — Assessment & Plan Note (Signed)
She understands the benefits of wt loss as well as adverse effects of obesity. Some of her medications and chronic medical problems can be contributing factors. Consistency with a healthful diet and physical activity is important.

## 2020-04-06 NOTE — Assessment & Plan Note (Signed)
Continue atorvastatin 20 mg daily and low-fat diet. Further recommendation will be given according to lipid panel result. 

## 2020-04-06 NOTE — Assessment & Plan Note (Signed)
HgA1C has been at goal. Continue Metformin 500 mg twice daily. We will arrange lab appointment. . Regular exercise and healthy diet with avoidance of added sugar food intake encouraged.  Continue annual eye exam, periodic dental and foot care. F/U in 5-6 months

## 2020-04-06 NOTE — Assessment & Plan Note (Signed)
BP adequately controlled. Continue losartan 25 mg daily. Continue monitoring BP regularly and low-salt diet. Eye exam is current.

## 2020-04-07 ENCOUNTER — Other Ambulatory Visit: Payer: Self-pay | Admitting: Cardiology

## 2020-04-08 ENCOUNTER — Other Ambulatory Visit: Payer: BC Managed Care – PPO

## 2020-04-11 DIAGNOSIS — Z3040 Encounter for surveillance of contraceptives, unspecified: Secondary | ICD-10-CM | POA: Diagnosis not present

## 2020-04-11 DIAGNOSIS — Z1231 Encounter for screening mammogram for malignant neoplasm of breast: Secondary | ICD-10-CM | POA: Diagnosis not present

## 2020-04-11 DIAGNOSIS — Z01419 Encounter for gynecological examination (general) (routine) without abnormal findings: Secondary | ICD-10-CM | POA: Diagnosis not present

## 2020-04-11 DIAGNOSIS — Z1211 Encounter for screening for malignant neoplasm of colon: Secondary | ICD-10-CM | POA: Diagnosis not present

## 2020-04-12 ENCOUNTER — Ambulatory Visit (INDEPENDENT_AMBULATORY_CARE_PROVIDER_SITE_OTHER): Payer: BC Managed Care – PPO | Admitting: Psychology

## 2020-04-12 DIAGNOSIS — F319 Bipolar disorder, unspecified: Secondary | ICD-10-CM | POA: Diagnosis not present

## 2020-04-13 DIAGNOSIS — M5412 Radiculopathy, cervical region: Secondary | ICD-10-CM | POA: Diagnosis not present

## 2020-04-13 DIAGNOSIS — M4802 Spinal stenosis, cervical region: Secondary | ICD-10-CM | POA: Diagnosis not present

## 2020-04-13 DIAGNOSIS — G959 Disease of spinal cord, unspecified: Secondary | ICD-10-CM | POA: Diagnosis not present

## 2020-04-13 DIAGNOSIS — I10 Essential (primary) hypertension: Secondary | ICD-10-CM | POA: Diagnosis not present

## 2020-04-13 DIAGNOSIS — M4712 Other spondylosis with myelopathy, cervical region: Secondary | ICD-10-CM | POA: Diagnosis not present

## 2020-04-14 ENCOUNTER — Other Ambulatory Visit: Payer: BC Managed Care – PPO

## 2020-04-19 ENCOUNTER — Ambulatory Visit (INDEPENDENT_AMBULATORY_CARE_PROVIDER_SITE_OTHER): Payer: BC Managed Care – PPO | Admitting: Psychology

## 2020-04-19 DIAGNOSIS — F319 Bipolar disorder, unspecified: Secondary | ICD-10-CM | POA: Diagnosis not present

## 2020-04-22 ENCOUNTER — Other Ambulatory Visit: Payer: BC Managed Care – PPO

## 2020-04-22 ENCOUNTER — Other Ambulatory Visit: Payer: Self-pay

## 2020-04-25 ENCOUNTER — Encounter: Payer: Self-pay | Admitting: Family Medicine

## 2020-04-25 ENCOUNTER — Telehealth (INDEPENDENT_AMBULATORY_CARE_PROVIDER_SITE_OTHER): Payer: BC Managed Care – PPO | Admitting: Family Medicine

## 2020-04-25 VITALS — Ht 65.0 in

## 2020-04-25 DIAGNOSIS — N951 Menopausal and female climacteric states: Secondary | ICD-10-CM | POA: Diagnosis not present

## 2020-04-25 DIAGNOSIS — J4520 Mild intermittent asthma, uncomplicated: Secondary | ICD-10-CM

## 2020-04-25 DIAGNOSIS — R059 Cough, unspecified: Secondary | ICD-10-CM | POA: Diagnosis not present

## 2020-04-25 MED ORDER — DOXYCYCLINE HYCLATE 100 MG PO TABS: 100.0000 mg | ORAL_TABLET | Freq: Two times a day (BID) | ORAL | 0 refills | Status: AC

## 2020-04-25 NOTE — Progress Notes (Signed)
Virtual Visit via Video Note I connected with [name] on 04/25/20 by a video enabled telemedicine application and verified that I am speaking with the correct person using two identifiers.  Location patient: home Location provider:Home office Persons participating in the virtual visit: patient, provider  I discussed the limitations of evaluation and management by telemedicine and the availability of in person appointments. The patient expressed understanding and agreed to proceed.  Chief Complaint  Patient presents with  . Cough   HPI: [name] is a 50 yo female with hx of hypertension, obesity hypoventilation syndrome, pulmonary hypertension, OSA, chronic respiratory failure with hypoxia complaining of worsening cough for the past 2 days. States that she "always" has mild productive cough back for the past 2 days she has not been able to bring up the sputum as she usually does and the cough seems to be more frequent. Negative for worsening shortness of breath.  No headache but her head feels "foggy." She has not noted fever,chills,unusual fatigue,nasal congestion,rhinorrhea,sore throat,wheeze, CP,palpitations,abdominal pain,N/V,heartburn,changes in bowel habits,urinary symptoms,skin rash,or unusual body aches. No orthopnea, PND,or worsening edema. Asthma: She is on Albuterol inh 2 puff bid, no more than usual.  COVID 19 vaccination completed. Planning on having home test COVID 19 test done after this visit. No sick contact or recent travel.  She has taking OTC Mucinex. She would like abx treatment.  She is also requesting a [place] done with her next labs. According the patient: [facility] was ordered by her gynecologist to evaluate for menopause. She is on OCP's, no menses in years.  ROS: See pertinent positives and negatives per HPI.  Past Medical History:  Diagnosis Date  . Acute systolic congestive heart failure ([place])   . Agoraphobia   . Anxiety    panic attacks  .  Arthritis    osteoarthritis bilal. knees  . Asthma    no per PFT 7/13; reports does not have asthma  . Bipolar disorder ([place])   . Carpal tunnel syndrome of left wrist 05/2011   Being evaluated for MS  . Cor pulmonale ([place])   . Depression   . History of pleural effusion   . Hyperlipidemia   . Hypertension   . Hypoxemia    history of - no home O2  . IBS (irritable bowel syndrome)   . Morbid obesity ([place])   . Obesity hypoventilation syndrome ([place])   . Pneumonia   . Pre-diabetes   . Prediabetes   . Seasonal allergies    current runny nose  . Seizures ([place])    febrile seizure x 1 as a child. Several times  . Sleep apnea sleep study 07/02/2010   uses CPAP nightly  . Spinal stenosis in cervical region   . Urinary incontinence     Past Surgical History:  Procedure Laterality Date  . APPLICATION OF INTRAOPERATIVE CT SCAN N/A 05/14/2019   Procedure: APPLICATION OF INTRAOPERATIVE CT SCAN;  Surgeon: Erline Levine, MD;  Location: [place];  Service: Neurosurgery;  Laterality: N/A;  . CARPAL TUNNEL RELEASE  06/27/2011   Procedure: CARPAL TUNNEL RELEASE;  Surgeon: Wynonia Sours, MD;  Location: [facility];  Service: Orthopedics;  Laterality: Left;  . CARPAL TUNNEL RELEASE  12/19/2011   Procedure: CARPAL TUNNEL RELEASE;  Surgeon: Wynonia Sours, MD;  Location: [facility];  Service: Orthopedics;  Laterality: Right;  . INCISION AND DRAINAGE ABSCESS Left 08/27/2017   Procedure: INCISION AND DRAINAGE LEFT BUTTOCK  ABSCESS;  Surgeon: Excell Seltzer, MD;  Location[facility];  Service: General;  Laterality: Left;  . IRRIGATION AND DEBRIDEMENT BUTTOCKS Left 08/30/2017   Procedure: IRRIGATION AND DEBRIDEMENT RE-EXCISION OF LEFT SUBCUTANEOUS BUTTOCKS ABCESS;  Surgeon: Kinsinger, Arta Bruce, MD;  Location: [facility];  Service: General;  Laterality: Left;  [name] MASS EXCISION  08/07/2010   right index  . POSTERIOR CERVICAL FUSION/FORAMINOTOMY  09/11/2011   Procedure: POSTERIOR CERVICAL  FUSION/FORAMINOTOMY LEVEL 1;  Surgeon: Erline Levine, MD;  Location: [facility];  Service: Neurosurgery;  Laterality: N/A;  Cervical Three-Four Posteior Cervical Fusion and Decompression.  [name] POSTERIOR CERVICAL FUSION/FORAMINOTOMY N/A 05/14/2019   Procedure: Posterior cervical decompression/fusion Cervical four to Thoracic one with exploration/revision of Cervical threee-four fusion;  Surgeon: Erline Levine, MD;  Location: [facility];  Service: Neurosurgery;  Laterality: N/A;  . TRIGGER FINGER RELEASE  12/19/2011   Procedure: RELEASE TRIGGER FINGER/A-1 PULLEY;  Surgeon: Wynonia Sours, MD;  Location: [facility];  Service: Orthopedics;  Laterality: Right;  . TRIGGER FINGER RELEASE Left 12/31/2012   Procedure: RELEASE A-1 PULLEY LEFT THUMB;  Surgeon: Wynonia Sours, MD;  Location: [facility];  Service: Orthopedics;  Laterality: Left;  . WISDOM TOOTH EXTRACTION     Family History  Problem Relation Age of Onset  . Asthma Mother   . Allergic rhinitis Mother   . Multiple sclerosis Mother   . Asthma Maternal Grandmother    Social History   Socioeconomic History  . Marital status: Married    Spouse name: Not on file  . Number of children: Not on file  . Years of education: Not on file  . Highest education level: Not on file  Occupational History  . Occupation: currently unemployed  Tobacco Use  . Smoking status: Never Smoker  . Smokeless tobacco: Never Used  Substance and Sexual Activity  . Alcohol use: Yes    Comment: rare  . Drug use: No  . Sexual activity: Not on file  Other Topics Concern  . Not on file  Social History Narrative   Lives in [place]         Social Determinants of Health   Financial Resource Strain: Low Risk   . Difficulty of Paying Living Expenses: Not hard at all  Food Insecurity: No Food Insecurity  . Worried About Charity fundraiser in the Last Year: Never true  . Ran Out of Food in the Last Year: Never true  Transportation Needs:  No Transportation Needs  . Lack of Transportation (Medical): No  . Lack of Transportation (Non-Medical): No  Physical Activity: Inactive  . Days of Exercise per Week: 0 days  . Minutes of Exercise per Session: 0 min  Stress: No Stress Concern Present  . Feeling of Stress : Not at all  Social Connections: Moderately Isolated  . Frequency of Communication with Friends and Family: More than three times a week  . Frequency of Social Gatherings with Friends and Family: Once a week  . Attends Religious Services: Never  . Active Member of Clubs or Organizations: No  . Attends Archivist Meetings: Never  . Marital Status: Married  Human resources officer Violence: Not At Risk  . Fear of Current or Ex-Partner: No  . Emotionally Abused: No  . Physically Abused: No  . Sexually Abused: No   Current Outpatient Medications:  .  acetaminophen (TYLENOL) 500 MG tablet, Take 1,000 mg by mouth every 6 (six) hours as needed for moderate pain. , Disp: , Rfl:  .  albuterol (VENTOLIN  HFA) 108 (90 Base) MCG/ACT inhaler, Inhale 2 puffs into the lungs every 6 (six) hours as needed for wheezing or shortness of breath., Disp: 8 g, Rfl: 1 .  ALPRAZolam (XANAX) 1 MG tablet, Take 1 mg by mouth 3 (three) times daily., Disp: , Rfl:  .  amphetamine-dextroamphetamine (ADDERALL) 20 MG tablet, Take 20 mg by mouth in the morning, at noon, in the evening, and at bedtime. , Disp: , Rfl:  .  Ascorbic Acid (VITAMIN C) 500 MG tablet, Take 1,000 mg by mouth daily., Disp: , Rfl:  .  aspirin 81 MG tablet, Take 1 tablet (81 mg total) by mouth daily., Disp: 30 tablet, Rfl:  .  atorvastatin (LIPITOR) 20 MG tablet, TAKE 1 TABLET BY MOUTH EVERY DAY IN THE EVENING, Disp: 90 tablet, Rfl: 0 .  b complex vitamins tablet, Take 1 tablet by mouth daily., Disp: , Rfl:  .  Calcium Citrate (CITRACAL PO), Take 2 tablets by mouth daily., Disp: , Rfl:  .  Cholecalciferol (VITAMIN D3) 5000 UNITS TABS, Take 10,000 Units by mouth in the morning and  at bedtime., Disp: , Rfl:  .  Chromium Picolinate 500 MCG TABS, Take 500 mcg by mouth daily. , Disp: , Rfl:  .  clonazePAM (KLONOPIN) 1 MG tablet, Take 1 tablet (1 mg total) by mouth 3 (three) times daily. (Patient taking differently: Take 1 mg by mouth in the morning, at noon, in the evening, and at bedtime.), Disp: 30 tablet, Rfl: 0 .  diclofenac Sodium (VOLTAREN) 1 % GEL, Apply 2 g topically 3 (three) times daily as needed (knee pain)., Disp: , Rfl:  .  furosemide (LASIX) 40 MG tablet, Take 2 tablets (80 mg total) by mouth 2 (two) times daily., Disp: 360 tablet, Rfl: 2 .  [name] 1.5/30 1.5-30 MG-MCG tablet, Take 1 tablet by mouth at bedtime., Disp: , Rfl:  .  KLOR-CON M20 20 MEQ tablet, TAKE 1 TABLET DAILY, Disp: 90 tablet, Rfl: 4 .  lamoTRIgine (LAMICTAL) 200 MG tablet, Take 200 mg by mouth 2 (two) times daily., Disp: , Rfl:  .  lidocaine (LIDODERM) 5 %, Place 1 patch onto the skin daily as needed (for knee pain). Remove & Discard patch within 12 hours or as directed by MD, Disp: 30 patch, Rfl: 0 .  losartan (COZAAR) 25 MG tablet, TAKE 1 TABLET TWICE A DAY, Disp: 180 tablet, Rfl: 3 .  LYRICA 300 MG capsule, Take 300 mg by mouth 2 (two) times daily. , Disp: , Rfl:  .  MAGNESIUM PO, Take 500 mg by mouth daily. , Disp: , Rfl:  .  metFORMIN (GLUCOPHAGE) 500 MG tablet, TAKE 1 TABLET TWICE DAILY  WITH MEALS, Disp: 180 tablet, Rfl: 1 .  methocarbamol (ROBAXIN-750) 750 MG tablet, Take 1 tablet (750 mg total) by mouth 3 (three) times daily as needed for muscle spasms., Disp: 90 tablet, Rfl: 1 .  Multiple Vitamin (MULTIVITAMIN) tablet, Take 1 tablet by mouth daily., Disp: , Rfl:  .  NUEDEXTA 20-10 MG CAPS, Take 1 capsule by mouth every 12 (twelve) hours. , Disp: , Rfl: 12 .  Omega-3 Fatty Acids (FISH OIL) 1200 MG CAPS, Take 2,400 mg by mouth daily. , Disp: , Rfl:  .  VRAYLAR 6 MG CAPS, Take 6 mg by mouth every evening. , Disp: , Rfl:  .  Zinc 50 MG CAPS, Take 50 mg by mouth daily., Disp: , Rfl:    EXAM:  VITALS per patient if applicable:Ht 5\' 5"  (1.651 m)  BMI 55.75 kg/m   GENERAL: alert, oriented, appears well and in no acute distress  HEENT: atraumatic, conjunctiva clear, no obvious abnormalities on inspection of external nose and ears  NECK: normal movements of the head and neck  LUNGS: on inspection no signs of respiratory distress, breathing rate appears normal, no obvious gross SOB, gasping or wheezing  CV: no obvious cyanosis  MS: moves all visible extremities without noticeable abnormality  PSYCH/NEURO: pleasant and cooperative, no obvious depression or anxiety, speech and thought processing grossly intact  ASSESSMENT AND PLAN:  Discussed the following assessment and plan:  Cough We discussed possible etiologies. Hx does not suggest a serious infectious process but she has several risk factors for complications. Could be caused by allergies/asthma.  She is not interested in having CXR at this time. Explained that based on hx I do not think abx is needed at this time. I sent Rx for Doxycycline, which she can start of she is not any better in 4-5 days. Some adverse effects of abx discussed.  She voices understanding and agrees with plan.  Mild intermittent asthma without complication I do not think oral Prednisone is needed at this time. Albuterol inh 2 puff every 6 hours for a week then as needed for wheezing or shortness of breath.  Clearly instructed about warning signs. Follows with pulmonologist.  Menopausal and female climacteric states - Plan: Follicle stimulating hormone FSH added to future labs as requested.  We discussed possible serious and likely etiologies, options for evaluation and workup, limitations of telemedicine visit vs in person visit, treatment, treatment risks and precautions. The patient was advised to call back or seek an in-person evaluation if the symptoms worsen or if the condition fails to improve as anticipated. I  discussed the assessment and treatment plan with the patient. Ms. Cheek was provided an opportunity to ask questions and all were answered. She agreed with the plan and demonstrated an understanding of the instructions.  Return if symptoms worsen or fail to improve.   Gavin Telford [place], MD

## 2020-04-26 ENCOUNTER — Ambulatory Visit: Payer: BC Managed Care – PPO | Admitting: Psychology

## 2020-05-03 ENCOUNTER — Ambulatory Visit: Payer: BC Managed Care – PPO | Admitting: Psychology

## 2020-05-10 ENCOUNTER — Ambulatory Visit (INDEPENDENT_AMBULATORY_CARE_PROVIDER_SITE_OTHER): Payer: BC Managed Care – PPO | Admitting: Psychology

## 2020-05-10 DIAGNOSIS — F319 Bipolar disorder, unspecified: Secondary | ICD-10-CM | POA: Diagnosis not present

## 2020-05-17 ENCOUNTER — Ambulatory Visit (INDEPENDENT_AMBULATORY_CARE_PROVIDER_SITE_OTHER): Payer: BC Managed Care – PPO | Admitting: Psychology

## 2020-05-17 DIAGNOSIS — F319 Bipolar disorder, unspecified: Secondary | ICD-10-CM

## 2020-05-20 ENCOUNTER — Other Ambulatory Visit (INDEPENDENT_AMBULATORY_CARE_PROVIDER_SITE_OTHER): Payer: BC Managed Care – PPO

## 2020-05-20 ENCOUNTER — Other Ambulatory Visit: Payer: Self-pay

## 2020-05-20 DIAGNOSIS — E785 Hyperlipidemia, unspecified: Secondary | ICD-10-CM | POA: Diagnosis not present

## 2020-05-20 DIAGNOSIS — N951 Menopausal and female climacteric states: Secondary | ICD-10-CM

## 2020-05-20 DIAGNOSIS — Z1159 Encounter for screening for other viral diseases: Secondary | ICD-10-CM

## 2020-05-20 DIAGNOSIS — E1169 Type 2 diabetes mellitus with other specified complication: Secondary | ICD-10-CM

## 2020-05-20 LAB — COMPREHENSIVE METABOLIC PANEL
ALT: 34 U/L (ref 0–35)
AST: 48 U/L — ABNORMAL HIGH (ref 0–37)
Albumin: 4 g/dL (ref 3.5–5.2)
Alkaline Phosphatase: 101 U/L (ref 39–117)
BUN: 16 mg/dL (ref 6–23)
CO2: 33 mEq/L — ABNORMAL HIGH (ref 19–32)
Calcium: 9.5 mg/dL (ref 8.4–10.5)
Chloride: 100 mEq/L (ref 96–112)
Creatinine, Ser: 1.02 mg/dL (ref 0.40–1.20)
GFR: 64.15 mL/min (ref >60.00)
Glucose, Bld: 138 mg/dL — ABNORMAL HIGH (ref 70–99)
Potassium: 4.2 mEq/L (ref 3.5–5.1)
Sodium: 144 mEq/L (ref 135–145)
Total Bilirubin: 0.3 mg/dL (ref 0.2–1.2)
Total Protein: 6.6 g/dL (ref 6.0–8.3)

## 2020-05-20 LAB — LIPID PANEL
Cholesterol: 166 mg/dL (ref 0–200)
HDL: 70.1 mg/dL (ref >39.00)
NonHDL: 95.83
Total CHOL/HDL Ratio: 2
Triglycerides: 210 mg/dL — ABNORMAL HIGH (ref 0.0–149.0)
VLDL: 42 mg/dL — ABNORMAL HIGH (ref 0.0–40.0)

## 2020-05-20 LAB — FOLLICLE STIMULATING HORMONE: FSH: 11.8 m[IU]/mL

## 2020-05-20 LAB — LDL CHOLESTEROL, DIRECT: Direct LDL: 84 mg/dL

## 2020-05-20 LAB — HEMOGLOBIN A1C: Hgb A1c MFr Bld: 6.6 % — ABNORMAL HIGH (ref 4.6–6.5)

## 2020-05-23 LAB — HEPATITIS C ANTIBODY
Hepatitis C Ab: NONREACTIVE
SIGNAL TO CUT-OFF: 0.01 (ref <1.00)

## 2020-05-23 LAB — FRUCTOSAMINE: Fructosamine: 231 umol/L (ref 205–285)

## 2020-05-24 ENCOUNTER — Ambulatory Visit (INDEPENDENT_AMBULATORY_CARE_PROVIDER_SITE_OTHER): Payer: BC Managed Care – PPO | Admitting: Psychology

## 2020-05-24 DIAGNOSIS — F319 Bipolar disorder, unspecified: Secondary | ICD-10-CM

## 2020-05-31 ENCOUNTER — Ambulatory Visit (INDEPENDENT_AMBULATORY_CARE_PROVIDER_SITE_OTHER): Payer: BC Managed Care – PPO | Admitting: Psychology

## 2020-05-31 DIAGNOSIS — F319 Bipolar disorder, unspecified: Secondary | ICD-10-CM | POA: Diagnosis not present

## 2020-06-02 ENCOUNTER — Telehealth: Payer: Self-pay | Admitting: Family Medicine

## 2020-06-02 NOTE — Telephone Encounter (Signed)
Patient is calling and is requesting a refill for metFORMIN (GLUCOPHAGE) 500 MG tablet to be sent to  [facility], [place] [address]  Phone:  [phone] Fax:  [phone]  CB is [phone]

## 2020-06-06 MED ORDER — METFORMIN HCL 500 MG PO TABS: 1.0000 | ORAL_TABLET | Freq: Two times a day (BID) | ORAL | 1 refills | Status: DC

## 2020-06-06 NOTE — Telephone Encounter (Signed)
Rx sent in as requested. 

## 2020-06-07 ENCOUNTER — Ambulatory Visit (INDEPENDENT_AMBULATORY_CARE_PROVIDER_SITE_OTHER): Payer: BC Managed Care – PPO | Admitting: Psychology

## 2020-06-07 DIAGNOSIS — F319 Bipolar disorder, unspecified: Secondary | ICD-10-CM | POA: Diagnosis not present

## 2020-06-14 ENCOUNTER — Ambulatory Visit (INDEPENDENT_AMBULATORY_CARE_PROVIDER_SITE_OTHER): Payer: BC Managed Care – PPO | Admitting: Psychology

## 2020-06-14 DIAGNOSIS — F319 Bipolar disorder, unspecified: Secondary | ICD-10-CM

## 2020-06-15 ENCOUNTER — Telehealth: Payer: Self-pay | Admitting: Family Medicine

## 2020-06-15 MED ORDER — METFORMIN HCL 500 MG PO TABS: 1.0000 | ORAL_TABLET | Freq: Two times a day (BID) | ORAL | 1 refills | Status: DC

## 2020-06-15 NOTE — Telephone Encounter (Signed)
Pt call and stated she need a refill on  metFORMIN (GLUCOPHAGE) 500 MG tablet sent to   [facility] [place] [place] [place] to [facility] [place] [facility] Phone:  [phone]  Fax:  [phone]

## 2020-06-15 NOTE — Telephone Encounter (Signed)
Rx sent in

## 2020-06-21 ENCOUNTER — Ambulatory Visit (INDEPENDENT_AMBULATORY_CARE_PROVIDER_SITE_OTHER): Payer: BC Managed Care – PPO | Admitting: Psychology

## 2020-06-21 DIAGNOSIS — F319 Bipolar disorder, unspecified: Secondary | ICD-10-CM | POA: Diagnosis not present

## 2020-06-28 ENCOUNTER — Ambulatory Visit (INDEPENDENT_AMBULATORY_CARE_PROVIDER_SITE_OTHER): Payer: BC Managed Care – PPO | Admitting: Psychology

## 2020-06-28 DIAGNOSIS — F319 Bipolar disorder, unspecified: Secondary | ICD-10-CM

## 2020-07-01 ENCOUNTER — Other Ambulatory Visit: Payer: Self-pay | Admitting: Cardiology

## 2020-07-05 ENCOUNTER — Ambulatory Visit (INDEPENDENT_AMBULATORY_CARE_PROVIDER_SITE_OTHER): Payer: BC Managed Care – PPO | Admitting: Psychology

## 2020-07-05 DIAGNOSIS — F319 Bipolar disorder, unspecified: Secondary | ICD-10-CM

## 2020-07-08 DIAGNOSIS — M4802 Spinal stenosis, cervical region: Secondary | ICD-10-CM | POA: Diagnosis not present

## 2020-07-08 DIAGNOSIS — G959 Disease of spinal cord, unspecified: Secondary | ICD-10-CM | POA: Diagnosis not present

## 2020-07-12 ENCOUNTER — Ambulatory Visit (INDEPENDENT_AMBULATORY_CARE_PROVIDER_SITE_OTHER): Payer: BC Managed Care – PPO | Admitting: Psychology

## 2020-07-12 DIAGNOSIS — F319 Bipolar disorder, unspecified: Secondary | ICD-10-CM

## 2020-07-19 ENCOUNTER — Ambulatory Visit (INDEPENDENT_AMBULATORY_CARE_PROVIDER_SITE_OTHER): Payer: BC Managed Care – PPO | Admitting: Psychology

## 2020-07-19 DIAGNOSIS — F319 Bipolar disorder, unspecified: Secondary | ICD-10-CM | POA: Diagnosis not present

## 2020-07-26 ENCOUNTER — Ambulatory Visit (INDEPENDENT_AMBULATORY_CARE_PROVIDER_SITE_OTHER): Payer: BC Managed Care – PPO | Admitting: Psychology

## 2020-07-26 DIAGNOSIS — F319 Bipolar disorder, unspecified: Secondary | ICD-10-CM

## 2020-08-07 ENCOUNTER — Other Ambulatory Visit: Payer: Self-pay | Admitting: Cardiology

## 2020-08-09 ENCOUNTER — Ambulatory Visit (INDEPENDENT_AMBULATORY_CARE_PROVIDER_SITE_OTHER): Payer: BC Managed Care – PPO | Admitting: Psychology

## 2020-08-09 DIAGNOSIS — F319 Bipolar disorder, unspecified: Secondary | ICD-10-CM | POA: Diagnosis not present

## 2020-08-16 ENCOUNTER — Ambulatory Visit (INDEPENDENT_AMBULATORY_CARE_PROVIDER_SITE_OTHER): Payer: BC Managed Care – PPO | Admitting: Psychology

## 2020-08-16 DIAGNOSIS — F319 Bipolar disorder, unspecified: Secondary | ICD-10-CM | POA: Diagnosis not present

## 2020-08-23 ENCOUNTER — Ambulatory Visit (INDEPENDENT_AMBULATORY_CARE_PROVIDER_SITE_OTHER): Payer: BC Managed Care – PPO | Admitting: Psychology

## 2020-08-23 DIAGNOSIS — F319 Bipolar disorder, unspecified: Secondary | ICD-10-CM

## 2020-08-30 ENCOUNTER — Ambulatory Visit (INDEPENDENT_AMBULATORY_CARE_PROVIDER_SITE_OTHER): Payer: BC Managed Care – PPO | Admitting: Psychology

## 2020-08-30 DIAGNOSIS — F319 Bipolar disorder, unspecified: Secondary | ICD-10-CM | POA: Diagnosis not present

## 2020-09-06 ENCOUNTER — Ambulatory Visit (INDEPENDENT_AMBULATORY_CARE_PROVIDER_SITE_OTHER): Payer: BC Managed Care – PPO | Admitting: Psychology

## 2020-09-06 DIAGNOSIS — F319 Bipolar disorder, unspecified: Secondary | ICD-10-CM | POA: Diagnosis not present

## 2020-09-13 ENCOUNTER — Ambulatory Visit (INDEPENDENT_AMBULATORY_CARE_PROVIDER_SITE_OTHER): Payer: BC Managed Care – PPO | Admitting: Psychology

## 2020-09-13 DIAGNOSIS — F319 Bipolar disorder, unspecified: Secondary | ICD-10-CM

## 2020-09-19 ENCOUNTER — Telehealth: Payer: Self-pay | Admitting: Cardiology

## 2020-09-19 NOTE — Telephone Encounter (Signed)
*  STAT* If patient is at the pharmacy, call can be transferred to refill team.   1. Which medications need to be refilled? (please list name of each medication and dose if known) atorvastatin (LIPITOR) 20 MG tablet  2. Which pharmacy/location (including street and city if local pharmacy) is medication to be sent to? [facility] [place] [place] [facility] to [facility]  3. Do they need a 30 day or 90 day supply? 90 day supply    Pt is out of this medication

## 2020-09-20 ENCOUNTER — Ambulatory Visit (INDEPENDENT_AMBULATORY_CARE_PROVIDER_SITE_OTHER): Payer: BC Managed Care – PPO | Admitting: Psychology

## 2020-09-20 DIAGNOSIS — F319 Bipolar disorder, unspecified: Secondary | ICD-10-CM | POA: Diagnosis not present

## 2020-09-22 ENCOUNTER — Other Ambulatory Visit: Payer: Self-pay | Admitting: Adult Health

## 2020-09-27 ENCOUNTER — Ambulatory Visit (INDEPENDENT_AMBULATORY_CARE_PROVIDER_SITE_OTHER): Payer: BC Managed Care – PPO | Admitting: Psychology

## 2020-09-27 ENCOUNTER — Other Ambulatory Visit: Payer: Self-pay

## 2020-09-27 DIAGNOSIS — F319 Bipolar disorder, unspecified: Secondary | ICD-10-CM | POA: Diagnosis not present

## 2020-09-27 MED ORDER — ATORVASTATIN CALCIUM 20 MG PO TABS: ORAL_TABLET | ORAL | 1 refills | Status: DC

## 2020-10-04 ENCOUNTER — Ambulatory Visit (INDEPENDENT_AMBULATORY_CARE_PROVIDER_SITE_OTHER): Payer: BC Managed Care – PPO | Admitting: Psychology

## 2020-10-04 DIAGNOSIS — F319 Bipolar disorder, unspecified: Secondary | ICD-10-CM | POA: Diagnosis not present

## 2020-10-05 ENCOUNTER — Telehealth: Payer: Self-pay | Admitting: Pulmonary Disease

## 2020-10-05 ENCOUNTER — Other Ambulatory Visit: Payer: Self-pay | Admitting: Cardiology

## 2020-10-05 NOTE — Telephone Encounter (Signed)
Called and spoke with patient regarding albuterol inhaler refill. Advised patient that is has been over 1 year (03/2019) since we've seen her. Patient asked if she could schedule a virtual visit.   TP is it ok to schedule patient as a virtual visit? Please advise  Thank you

## 2020-10-06 MED ORDER — ALBUTEROL SULFATE HFA 108 (90 BASE) MCG/ACT IN AERS: 2.0000 | INHALATION_SPRAY | Freq: Four times a day (QID) | RESPIRATORY_TRACT | 0 refills | Status: DC | PRN

## 2020-10-06 NOTE — Telephone Encounter (Signed)
It is fine to refill her albuterol She can make an appointment in the next available if she is able to come and if unable to come into the visit we can do a video visit. Would prefer a in person visit as her last visit was virtual

## 2020-10-06 NOTE — Telephone Encounter (Signed)
Rx sent Appt wRA 12/14/20  Pt states nothing further needed

## 2020-10-11 ENCOUNTER — Ambulatory Visit (INDEPENDENT_AMBULATORY_CARE_PROVIDER_SITE_OTHER): Payer: BC Managed Care – PPO | Admitting: Psychology

## 2020-10-11 DIAGNOSIS — F319 Bipolar disorder, unspecified: Secondary | ICD-10-CM

## 2020-10-13 ENCOUNTER — Telehealth: Payer: Self-pay | Admitting: Pulmonary Disease

## 2020-10-13 NOTE — Telephone Encounter (Signed)
At this time, there is no fax for Albuterol PA in office.  Called [facility] for [facility].  Spoke with [employer], Pharmacy Tech. Albuterol/Ventolin is not covered by Patient insurance.  Preferred alternative is levalbuterol HFA. Patient is scheduled 12/14/20, with Dr. Elsworth Soho.  Message routed to Dr. Elsworth Soho to advise

## 2020-10-14 MED ORDER — LEVALBUTEROL TARTRATE 45 MCG/ACT IN AERO: 2.0000 | INHALATION_SPRAY | Freq: Four times a day (QID) | RESPIRATORY_TRACT | 3 refills | Status: AC | PRN

## 2020-10-14 NOTE — Telephone Encounter (Signed)
[facility]  Patient Husband [name] [facility]) is aware. Prescription requested for 90 days supply.  Nothing further at this time.

## 2020-10-18 ENCOUNTER — Ambulatory Visit (INDEPENDENT_AMBULATORY_CARE_PROVIDER_SITE_OTHER): Payer: BC Managed Care – PPO | Admitting: Psychology

## 2020-10-18 DIAGNOSIS — F319 Bipolar disorder, unspecified: Secondary | ICD-10-CM

## 2020-10-25 ENCOUNTER — Ambulatory Visit (INDEPENDENT_AMBULATORY_CARE_PROVIDER_SITE_OTHER): Payer: BC Managed Care – PPO | Admitting: Psychology

## 2020-10-25 DIAGNOSIS — F319 Bipolar disorder, unspecified: Secondary | ICD-10-CM | POA: Diagnosis not present

## 2020-11-01 ENCOUNTER — Ambulatory Visit (INDEPENDENT_AMBULATORY_CARE_PROVIDER_SITE_OTHER): Payer: BC Managed Care – PPO | Admitting: Psychology

## 2020-11-01 DIAGNOSIS — F319 Bipolar disorder, unspecified: Secondary | ICD-10-CM

## 2020-11-08 ENCOUNTER — Ambulatory Visit (INDEPENDENT_AMBULATORY_CARE_PROVIDER_SITE_OTHER): Payer: BC Managed Care – PPO | Admitting: Psychology

## 2020-11-08 DIAGNOSIS — F319 Bipolar disorder, unspecified: Secondary | ICD-10-CM

## 2020-11-15 ENCOUNTER — Ambulatory Visit (INDEPENDENT_AMBULATORY_CARE_PROVIDER_SITE_OTHER): Payer: BC Managed Care – PPO | Admitting: Psychology

## 2020-11-15 DIAGNOSIS — F319 Bipolar disorder, unspecified: Secondary | ICD-10-CM

## 2020-11-22 ENCOUNTER — Ambulatory Visit: Payer: BC Managed Care – PPO | Admitting: Psychology

## 2020-11-29 ENCOUNTER — Ambulatory Visit (INDEPENDENT_AMBULATORY_CARE_PROVIDER_SITE_OTHER): Payer: BC Managed Care – PPO | Admitting: Psychology

## 2020-11-29 DIAGNOSIS — F319 Bipolar disorder, unspecified: Secondary | ICD-10-CM | POA: Diagnosis not present

## 2020-12-06 ENCOUNTER — Ambulatory Visit (INDEPENDENT_AMBULATORY_CARE_PROVIDER_SITE_OTHER): Payer: BC Managed Care – PPO | Admitting: Psychology

## 2020-12-06 DIAGNOSIS — F319 Bipolar disorder, unspecified: Secondary | ICD-10-CM | POA: Diagnosis not present

## 2020-12-13 ENCOUNTER — Ambulatory Visit (INDEPENDENT_AMBULATORY_CARE_PROVIDER_SITE_OTHER): Payer: BC Managed Care – PPO | Admitting: Psychology

## 2020-12-13 DIAGNOSIS — F319 Bipolar disorder, unspecified: Secondary | ICD-10-CM | POA: Diagnosis not present

## 2020-12-14 ENCOUNTER — Ambulatory Visit: Payer: BC Managed Care – PPO | Admitting: Pulmonary Disease

## 2020-12-20 ENCOUNTER — Ambulatory Visit: Payer: BC Managed Care – PPO | Admitting: Psychology

## 2020-12-27 ENCOUNTER — Ambulatory Visit (INDEPENDENT_AMBULATORY_CARE_PROVIDER_SITE_OTHER): Payer: BC Managed Care – PPO | Admitting: Psychology

## 2020-12-27 DIAGNOSIS — F319 Bipolar disorder, unspecified: Secondary | ICD-10-CM

## 2020-12-28 ENCOUNTER — Ambulatory Visit (INDEPENDENT_AMBULATORY_CARE_PROVIDER_SITE_OTHER): Payer: BC Managed Care – PPO | Admitting: Pulmonary Disease

## 2020-12-28 ENCOUNTER — Other Ambulatory Visit: Payer: Self-pay

## 2020-12-28 ENCOUNTER — Encounter: Payer: Self-pay | Admitting: Pulmonary Disease

## 2020-12-28 DIAGNOSIS — I5042 Chronic combined systolic (congestive) and diastolic (congestive) heart failure: Secondary | ICD-10-CM | POA: Diagnosis not present

## 2020-12-28 DIAGNOSIS — Z9989 Dependence on other enabling machines and devices: Secondary | ICD-10-CM

## 2020-12-28 DIAGNOSIS — G4733 Obstructive sleep apnea (adult) (pediatric): Secondary | ICD-10-CM | POA: Diagnosis not present

## 2020-12-28 NOTE — Progress Notes (Signed)
   Subjective:    Patient ID: [name], female    DOB: 03/03/1970, 50 y.o.   MRN: [id number]  HPI  50 yo never smoker with morbid obesity for FU of obstructive sleep apnea on CPAP 13 cm, obesity hypoventilation & 'asthma'   PMH -DM-2 & bipolar -Sees Dr Toy Care     Prior  dx with asthma has been tx in past with advair , theophylline.  Annual follow-up visit, accompanied by her husband [name]. She is vaccinated and boosted Takes 80 mg of Lasix twice daily, weight is at baseline. Compliant with CPAP and denies any problems with mask or pressure.  She got a new machine in 2021. Husband reports that she sleeps well, no snoring or no witnessed apneas seems rested in the daytime. She takes Adderall thrice daily   Significant tests/ events reviewed  04/2010 ABG was 7.40/49/63 on 4 L [place]. [name]    PSG  2002 >> severe OSA  with AHI 40/h, lowest desatn to 52% >> 8 cm H2O - wt 325 lbs   07/2015 CPAP titration study >> required 10-16cm CPAP, higher pressure when supine/ REM sleep.      PFTs 06/2011 - no obstruction, mild restriction, FVC 78%, DLCO 64%   Review of Systems neg for any significant sore throat, dysphagia, itching, sneezing, nasal congestion or excess/ purulent secretions, fever, chills, sweats, unintended wt loss, pleuritic or exertional cp, hempoptysis, orthopnea pnd or change in chronic leg swelling. Also denies presyncope, palpitations, heartburn, abdominal pain, nausea, vomiting, diarrhea or change in bowel or urinary habits, dysuria,hematuria, rash, arthralgias, visual complaints, headache, numbness weakness or ataxia.     Objective:   Physical Exam  Gen. Pleasant, obese, in no distress ENT - no lesions, no post nasal drip Neck: No JVD, no thyromegaly, no carotid bruits Lungs: no use of accessory muscles, no dullness to percussion, decreased without rales or rhonchi  Cardiovascular: Rhythm regular, heart sounds  normal, no murmurs or gallops, no peripheral  edema Musculoskeletal: No deformities, no cyanosis or clubbing , no tremors Sweating +      Assessment & Plan:

## 2020-12-28 NOTE — Assessment & Plan Note (Signed)
CPAP download was reviewed which shows good control of events on auto 10 to 16 cm with average pressure 14 and maximum pressure of 16 cm.  She is very compliant and usage is about 11 hours every night.  Large leak is noted but this does not seem to bother her or her husband  Weight loss encouraged, compliance with goal of at least 4-6 hrs every night is the expectation. Advised against medications with sedative side effects Cautioned against driving when sleepy - understanding that sleepiness will vary on a day to day basis

## 2020-12-28 NOTE — Patient Instructions (Addendum)
  CPAP is working well on current settings. Please take the flu shot when able

## 2020-12-28 NOTE — Assessment & Plan Note (Signed)
Weight-based dosing of Lasix with 80 mg twice daily Current weight is at baseline

## 2020-12-29 ENCOUNTER — Encounter: Payer: Self-pay | Admitting: Cardiology

## 2020-12-30 ENCOUNTER — Other Ambulatory Visit: Payer: Self-pay | Admitting: Family Medicine

## 2020-12-30 ENCOUNTER — Telehealth: Payer: Self-pay | Admitting: Cardiology

## 2020-12-30 NOTE — Telephone Encounter (Signed)
Called patient, she states she would like to get her re certification for handicap, she states she has an appointment in January on the 17th, but she states it runs out before her appointment. Can we redo this before the appointment   Advised I would route to MD.

## 2020-12-30 NOTE — Telephone Encounter (Signed)
Patient calling to to get handicap recertification. She would like to know if this has to be done at or after her appointment or if the paperwork can be filled out now.

## 2021-01-02 NOTE — Telephone Encounter (Signed)
Called patient, spoke to her and her husband. Advised to bring the forms that were needed on a day that Dr.[name] was here and we could have him sign it.   Patient and husband reviewed schedule and would bring it by one day.

## 2021-01-03 ENCOUNTER — Ambulatory Visit: Payer: BC Managed Care – PPO | Admitting: Psychology

## 2021-01-04 ENCOUNTER — Ambulatory Visit (INDEPENDENT_AMBULATORY_CARE_PROVIDER_SITE_OTHER): Payer: BC Managed Care – PPO | Admitting: Psychology

## 2021-01-04 DIAGNOSIS — F319 Bipolar disorder, unspecified: Secondary | ICD-10-CM | POA: Diagnosis not present

## 2021-01-04 NOTE — Progress Notes (Signed)
Progress Note Date: 01/04/2021  Diagnosis 296.80 (Unspecified bipolar or related disorder) [n/a]  Symptoms Depressed or irritable mood. (Status: maintained) -- No Description Entered  Diminished interest in or enjoyment of activities. (Status: maintained) -- No Description Entered  Lack of energy. (Status: maintained) -- No Description Entered  Low self-esteem. (Status: maintained) -- No Description Entered  Medication Status compliance  Safety none  If Suicidal or Homicidal State Action Taken: unspecified  Current Risk: low Medications Adderall (Dosage: 20mg  4X/day)  Klonapin (Dosage: 1mg  3X/day)  Lamictal (Dosage: 200mg  2x/day)  Nuedexta (Dosage: 20mg )  Vyralar (Dosage: 6mg )  Xanax (Dosage: 1mg  3X/day)  Objectives Related Problem: Develop healthy cognitive patterns and beliefs about self and the world that lead to alleviation and help prevent the relapse of mood episodes. Description: State no longer having thoughts of self-harm. Target Date: 2021-02-07 Frequency: Daily Modality: individual Progress: 80%  Related Problem: Develop healthy cognitive patterns and beliefs about self and the world that lead to alleviation and help prevent the relapse of mood episodes. Description: Verbalize any history of past and present suicidal thoughts and actions. Target Date: 2021-02-07 Frequency: Daily Modality: individual Progress: 80%  Related Problem: Develop healthy cognitive patterns and beliefs about self and the world that lead to alleviation and help prevent the relapse of mood episodes. Description: Take prescribed medications as directed. Target Date: 2021-02-07 Frequency: Daily Modality: individual Progress: 90%  Related Problem: Develop healthy cognitive patterns and beliefs about self and the world that lead to alleviation and help prevent the relapse of mood episodes. Description: Discuss and resolve troubling personal and interpersonal issues. Target Date:  2021-02-07 Frequency: Daily Modality: individual Progress: 80%  Client Response full compliance  Service Location Location, [name] [place], [place] [address]  Service Code cpt [phone]  Emotion regulation skills  Related past to present  Rationally challenge thoughts or beliefs/cognitive restructuring  Identify/label emotions  Validate/empathize  Self care activities  Lifestyle change (exercise, nutrition)  Self-monitoring  Facilitate problem solving  Comments  f31.9 Bipolar Unspecified. Goals: Mood stabilization, improved marital relationship, improve self esteem and elimination of self harm thoughts/tendencies. Also, patient struggles with leaving home comfortably and seeks to improve this ability. Target date for these goals is January, 2023. In addition, she needs to improve self care behaviors due to her weight and various medical conditions. Target date for this goal is Feb., 2022. Date revised to January, 2023 to allow time for behavioral implementation. Reports periodic outbursts of rage/anger. Seeks to minimize. Target date is January, 2023. Patient has realized improvement in all areas. Requests additional treatment to facilitate maintenance of progress. Goal date 1-23 Meds: Vyralar (6mg ), Lamictal (100mg  2X/day), Nuedexta (40+20mg /day), Adderall (20mg  4X/day), Xanax (1mg , 3X/day), and Klonopin (1mg  3X/day). Patient has requested a video Webex session due to the corona virus. She is at home and I am in my home office. Lauranne says that she got a call from her sister saying she is depressed and needs her to come to [place]. This will be her first trip back in "at least" 5 years. She doesn't want to go and was especially upset about going without [name]. He decided to go to help her out. Shared with her he is disappointed that she "needs" him to go with her. Deeanna fears she is not good enough to take care of her sister, both physically and emotionally. We talked about ways she  can be of some help/support. She will offer her sister to come live with she and [name] for  a while.         Cartier Washko LEWIS

## 2021-01-10 ENCOUNTER — Ambulatory Visit (INDEPENDENT_AMBULATORY_CARE_PROVIDER_SITE_OTHER): Payer: BC Managed Care – PPO | Admitting: Psychology

## 2021-01-10 DIAGNOSIS — F319 Bipolar disorder, unspecified: Secondary | ICD-10-CM

## 2021-01-10 NOTE — Progress Notes (Signed)
Treatment Plan: Date: 01/10/2021  Diagnosis 296.80 (Unspecified bipolar or related disorder) [n/a]  Symptoms Depressed or irritable mood. (Status: maintained) -- No Description Entered  Diminished interest in or enjoyment of activities. (Status: maintained) -- No Description Entered  Lack of energy. (Status: maintained) -- No Description Entered  Low self-esteem. (Status: maintained) -- No Description Entered  Medication Status compliance  Safety none  If Suicidal or Homicidal State Action Taken: unspecified  Current Risk: low Medications Adderall (Dosage: 20mg  4X/day)  Klonapin (Dosage: 1mg  3X/day)  Lamictal (Dosage: 200mg  2x/day)  Nuedexta (Dosage: 20mg )  Vyralar (Dosage: 6mg )  Xanax (Dosage: 1mg  3X/day)  Objectives Related Problem: Develop healthy cognitive patterns and beliefs about self and the world that lead to alleviation and help prevent the relapse of mood episodes. Description: State no longer having thoughts of self-harm. Target Date: 2021-02-07 Frequency: Daily Modality: individual Progress: 80%  Related Problem: Develop healthy cognitive patterns and beliefs about self and the world that lead to alleviation and help prevent the relapse of mood episodes. Description: Verbalize any history of past and present suicidal thoughts and actions. Target Date: 2021-02-07 Frequency: Daily Modality: individual Progress: 80%  Related Problem: Develop healthy cognitive patterns and beliefs about self and the world that lead to alleviation and help prevent the relapse of mood episodes. Description: Take prescribed medications as directed. Target Date: 2021-02-07 Frequency: Daily Modality: individual Progress: 90%  Related Problem: Develop healthy cognitive patterns and beliefs about self and the world that lead to alleviation and help prevent the relapse of mood episodes. Description: Discuss and resolve troubling personal and interpersonal issues. Target Date:  2021-02-07 Frequency: Daily Modality: individual Progress: 80%  Client Response full compliance  Service Location Location, [name] [place], [place] [address]  Service Code cpt [phone]  Emotion regulation skills  Related past to present  Rationally challenge thoughts or beliefs/cognitive restructuring  Identify/label emotions  Validate/empathize  Self care activities  Lifestyle change (exercise, nutrition)  Self-monitoring  Facilitate problem solving  Comments  f31.9 Bipolar Unspecified.  Goals: Mood stabilization, improved marital relationship, improve self esteem and elimination of self harm thoughts/tendencies. Also, patient struggles with leaving home comfortably and seeks to improve this ability. Target date for these goals is January, 2023. In addition, she needs to improve self care behaviors due to her weight and various medical conditions. Target date for this goal is Feb., 2022. Date revised to January, 2023 to allow time for behavioral implementation. Reports periodic outbursts of rage/anger. Seeks to minimize. Target date is January, 2023. Patient has realized improvement in all areas. Requests additional treatment to facilitate maintenance of progress. Goal date 1-23  Meds: Vyralar (6mg ), Lamictal (100mg  2X/day), Nuedexta (40+20mg /day), Adderall (20mg  4X/day), Xanax (1mg , 3X/day), and Klonopin (1mg  3X/day).  Patient has requested a video Webex session due to the corona virus. She is at home and I am in my home office. Sevin returned from her trip to visit her depressed sister. The situation is difficult and complicated. Jayliah helped her sort out the issues, but her sister is not being professionally treated. Sister is uninsured , working only part-time. Husband [name] resented going with her for this trip. She said he was difficult to be with and was clear he was not happy about being there.           [name], PhD  Time:2:15-3:00 45 minutes.

## 2021-01-11 ENCOUNTER — Telehealth: Payer: Self-pay

## 2021-01-11 MED ORDER — LIDOCAINE 5 % EX PTCH: 1.0000 | MEDICATED_PATCH | Freq: Every day | CUTANEOUS | 0 refills | Status: DC | PRN

## 2021-01-11 NOTE — Addendum Note (Signed)
Addended by: Rodrigo Ran on: 01/11/2021 04:15 PM   Modules accepted: Orders

## 2021-01-11 NOTE — Telephone Encounter (Signed)
Patient called requesting Rx refill lidocaine (LIDODERM) 5 %

## 2021-01-12 ENCOUNTER — Other Ambulatory Visit: Payer: Self-pay

## 2021-01-12 ENCOUNTER — Encounter: Payer: Self-pay | Admitting: Cardiology

## 2021-01-12 MED ORDER — LOSARTAN POTASSIUM 25 MG PO TABS: 25.0000 mg | ORAL_TABLET | Freq: Two times a day (BID) | ORAL | 1 refills | Status: DC

## 2021-01-12 NOTE — Progress Notes (Deleted)
Mailed handicap decal application to patient.

## 2021-01-17 ENCOUNTER — Ambulatory Visit (INDEPENDENT_AMBULATORY_CARE_PROVIDER_SITE_OTHER): Payer: BC Managed Care – PPO | Admitting: Psychology

## 2021-01-17 DIAGNOSIS — F319 Bipolar disorder, unspecified: Secondary | ICD-10-CM

## 2021-01-17 NOTE — Telephone Encounter (Signed)
Husband was calling back to see if paperwork was received and fill out the handicap paperwork recert. Please advise

## 2021-01-17 NOTE — Telephone Encounter (Signed)
Spoke with [name], he states they left a refill request with the front desk the same day the handicap paperwork was dropped off and they have already received the refill. He just wanted to make sure the paperwork did not get "lost in the shuffle". Almyra Free has been handling the matter and will forward this message over to her, she is out today.
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
## 2021-01-18 ENCOUNTER — Encounter: Payer: Self-pay | Admitting: Cardiology

## 2021-01-18 NOTE — Telephone Encounter (Signed)
Patient called to say that she did received the paperwork for the handicap in the mail today

## 2021-01-18 NOTE — Telephone Encounter (Signed)
This encounter was created in error - please disregard.

## 2021-01-18 NOTE — Telephone Encounter (Signed)
Spoke with pt, she reports they received the handicap paperwork in the mail today.

## 2021-01-20 ENCOUNTER — Ambulatory Visit: Payer: BC Managed Care – PPO | Admitting: Cardiology

## 2021-01-24 ENCOUNTER — Ambulatory Visit: Payer: BC Managed Care – PPO | Admitting: Psychology

## 2021-01-30 ENCOUNTER — Other Ambulatory Visit: Payer: Self-pay | Admitting: Cardiology

## 2021-01-31 ENCOUNTER — Ambulatory Visit: Payer: BC Managed Care – PPO | Admitting: Psychology

## 2021-02-07 ENCOUNTER — Ambulatory Visit (INDEPENDENT_AMBULATORY_CARE_PROVIDER_SITE_OTHER): Payer: BC Managed Care – PPO | Admitting: Psychology

## 2021-02-07 DIAGNOSIS — F319 Bipolar disorder, unspecified: Secondary | ICD-10-CM | POA: Diagnosis not present

## 2021-02-07 NOTE — Progress Notes (Signed)
[name] is a 51 y.o. female patient   Treatment Plan: Date: 02/07/2021  Diagnosis 296.80 (Unspecified bipolar or related disorder) [n/a]  Symptoms Depressed or irritable mood. (Status: maintained) -- No Description Entered  Diminished interest in or enjoyment of activities. (Status: maintained) -- No Description Entered  Lack of energy. (Status: maintained) -- No Description Entered  Low self-esteem. (Status: maintained) -- No Description Entered  Medication Status compliance  Safety none  If Suicidal or Homicidal State Action Taken: unspecified  Current Risk: low Medications Adderall (Dosage: 20mg  4X/day)  Klonapin (Dosage: 1mg  3X/day)  Lamictal (Dosage: 200mg  2x/day)  Nuedexta (Dosage: 20mg )  Vyralar (Dosage: 6mg )  Xanax (Dosage: 1mg  3X/day)  Objectives Related Problem: Develop healthy cognitive patterns and beliefs about self and the world that lead to alleviation and help prevent the relapse of mood episodes. Description: State no longer having thoughts of self-harm. Target Date: 2021-02-07 Frequency: Daily Modality: individual Progress: 80%  Related Problem: Develop healthy cognitive patterns and beliefs about self and the world that lead to alleviation and help prevent the relapse of mood episodes. Description: Verbalize any history of past and present suicidal thoughts and actions. Target Date: 2021-02-07 Frequency: Daily Modality: individual Progress: 80%  Related Problem: Develop healthy cognitive patterns and beliefs about self and the world that lead to alleviation and help prevent the relapse of mood episodes. Description: Take prescribed medications as directed. Target Date: 2021-02-07 Frequency: Daily Modality: individual Progress: 90%  Related Problem: Develop healthy cognitive patterns and beliefs about self and the world that lead to alleviation and help prevent the relapse of mood episodes. Description: Discuss and resolve troubling personal  and interpersonal issues. Target Date: 2021-02-07 Frequency: Daily Modality: individual Progress: 80%  Client Response full compliance  Service Location Location, [name] [place], [place] [address]  Service Code cpt [phone]  Emotion regulation skills  Related past to present  Rationally challenge thoughts or beliefs/cognitive restructuring  Identify/label emotions  Validate/empathize  Self care activities  Lifestyle change (exercise, nutrition)  Self-monitoring  Facilitate problem solving  Comments  f31.9 Bipolar Unspecified.  Goals: Mood stabilization, improved marital relationship, improve self esteem and elimination of self harm thoughts/tendencies. Also, patient struggles with leaving home comfortably and seeks to improve this ability. Target date for these goals is January, 2023. In addition, she needs to improve self care behaviors due to her weight and various medical conditions. Target date for this goal is Feb., 2022. Date revised to January, 2023 to allow time for behavioral implementation. Reports periodic outbursts of rage/anger. Seeks to minimize. Target date is January, 2023. Patient has realized improvement in all areas. Requests additional treatment to facilitate maintenance of progress. Goal date 1-23  Meds: Vyralar (6mg ), Lamictal (100mg  2X/day), Nuedexta (40+20mg /day), Adderall (20mg  4X/day), Xanax (1mg , 3X/day), and Klonopin (1mg  3X/day).  Patient has requested a video Webex session due to the corona virus. She is at home and I am in my home office.  [name] states that both of her parents have had illness. She talked about the likelihood that the coming years will be filled with a variety of medical issues for her parents due to her age. Her father had a serious situation last weekend and her family, including husband, decided not to tell Alanis. She does not know why and did not ask. It seems they felt she was emotionally too fragile to deal with this  situation. She says she will inquire about their motivation. [name] claims she is capable of handling (emotionally) any  of their medical situations. She is feeling stable and reports that moods are even.                   [name], PhD  Time:2:15-3:00 45 minutes.

## 2021-02-08 ENCOUNTER — Ambulatory Visit: Payer: BC Managed Care – PPO | Admitting: Cardiology

## 2021-02-13 NOTE — Progress Notes (Signed)
Cardiology Office Note   Date:  02/14/2021   ID:  [name], DOB [birth year], MRN [id number]  PCP:  Velasquez, Jessica G, MD  Cardiologist:   Minus Breeding, MD Referring:  Velasquez, Jessica G, MD  Chief Complaint  Patient presents with   Pulmonary HTN      History of Present Illness: [name] is a 51 y.o. female who presents for evaluation with mildly reduced ejection fraction although in the last visit it was well preserved.  She has had some mildly elevated pulmonary pressures as well.  Since I last saw her she has done well.  She has been off the oxygen for a couple of years.  She does not get out of the house much with her agoraphobia and anxiety.  However, she does her activities of daily living.  She denies any chest pressure, neck or arm discomfort.  She has no shortness of breath, PND or orthopnea.  She has no palpitations, presyncope or syncope.    Past Medical History:  Diagnosis Date   Acute systolic congestive heart failure (HCC)    Agoraphobia    Anxiety    panic attacks   Arthritis    osteoarthritis bilal. knees   Asthma    no per PFT 7/13; reports does not have asthma   Bipolar disorder ([facility])    Carpal tunnel syndrome of left wrist 05/2011   Being evaluated for MS   Cor pulmonale ([facility])    Depression    History of pleural effusion    Hyperlipidemia    Hypertension    Hypoxemia    history of - no home O2   IBS (irritable bowel syndrome)    Morbid obesity (HCC)    Obesity hypoventilation syndrome (HCC)    Pneumonia    Pre-diabetes    Prediabetes    Seasonal allergies    current runny nose   Seizures (HCC)    febrile seizure x 1 as a child. Several times   Sleep apnea sleep study 07/02/2010   uses CPAP nightly   Spinal stenosis in cervical region    Urinary incontinence     Past Surgical History:  Procedure Laterality Date   APPLICATION OF INTRAOPERATIVE CT SCAN N/A 05/14/2019   Procedure: APPLICATION OF INTRAOPERATIVE CT SCAN;   Surgeon: Erline Levine, MD;  Location: [facility];  Service: Neurosurgery;  Laterality: N/A;   CARPAL TUNNEL RELEASE  06/27/2011   Procedure: CARPAL TUNNEL RELEASE;  Surgeon: Wynonia Sours, MD;  Location: [facility];  Service: Orthopedics;  Laterality: Left;   CARPAL TUNNEL RELEASE  12/19/2011   Procedure: CARPAL TUNNEL RELEASE;  Surgeon: Wynonia Sours, MD;  Location: [facility];  Service: Orthopedics;  Laterality: Right;   INCISION AND DRAINAGE ABSCESS Left 08/27/2017   Procedure: INCISION AND DRAINAGE LEFT BUTTOCK  ABSCESS;  Surgeon: Excell Seltzer, MD;  Location: [facility];  Service: General;  Laterality: Left;   IRRIGATION AND DEBRIDEMENT BUTTOCKS Left 08/30/2017   Procedure: IRRIGATION AND DEBRIDEMENT RE-EXCISION OF LEFT SUBCUTANEOUS BUTTOCKS ABCESS;  Surgeon: Kieth Brightly Arta Bruce, MD;  Location: [facility];  Service: General;  Laterality: Left;   MASS EXCISION  08/07/2010   right index   POSTERIOR CERVICAL FUSION/FORAMINOTOMY  09/11/2011   Procedure: POSTERIOR CERVICAL FUSION/FORAMINOTOMY LEVEL 1;  Surgeon: Erline Levine, MD;  Location: [facility];  Service: Neurosurgery;  Laterality: N/A;  Cervical Three-Four Posteior Cervical Fusion and Decompression.   POSTERIOR CERVICAL FUSION/FORAMINOTOMY N/A 05/14/2019   Procedure:  Posterior cervical decompression/fusion Cervical four to Thoracic one with exploration/revision of Cervical threee-four fusion;  Surgeon: Erline Levine, MD;  Location: [facility];  Service: Neurosurgery;  Laterality: N/A;   TRIGGER FINGER RELEASE  12/19/2011   Procedure: RELEASE TRIGGER FINGER/A-1 PULLEY;  Surgeon: Wynonia Sours, MD;  Location: [facility];  Service: Orthopedics;  Laterality: Right;   TRIGGER FINGER RELEASE Left 12/31/2012   Procedure: RELEASE A-1 PULLEY LEFT THUMB;  Surgeon: Wynonia Sours, MD;  Location: [facility];  Service: Orthopedics;  Laterality: Left;   WISDOM TOOTH EXTRACTION       Current Outpatient Medications   Medication Sig Dispense Refill   acetaminophen (TYLENOL) 500 MG tablet Take 1,000 mg by mouth every 6 (six) hours as needed for moderate pain.      albuterol (VENTOLIN HFA) 108 (90 Base) MCG/ACT inhaler Inhale 2 puffs into the lungs every 6 (six) hours as needed for wheezing or shortness of breath. 32 g 0   ALPRAZolam (XANAX) 1 MG tablet Take 1 mg by mouth 3 (three) times daily.     amphetamine-dextroamphetamine (ADDERALL) 20 MG tablet Take 20 mg by mouth in the morning, at noon, in the evening, and at bedtime.      Ascorbic Acid (VITAMIN C) 500 MG tablet Take 1,000 mg by mouth daily.     aspirin 81 MG tablet Take 1 tablet (81 mg total) by mouth daily. 30 tablet    atorvastatin (LIPITOR) 20 MG tablet TAKE 1 TABLET BY MOUTH EVERY DAY IN THE EVENING 90 tablet 1   b complex vitamins tablet Take 1 tablet by mouth daily.     Calcium Citrate (CITRACAL PO) Take 2 tablets by mouth daily.     Cholecalciferol (VITAMIN D3) 5000 UNITS TABS Take 10,000 Units by mouth in the morning and at bedtime.     Chromium Picolinate 500 MCG TABS Take 500 mcg by mouth daily.      clonazePAM (KLONOPIN) 1 MG tablet Take 1 tablet (1 mg total) by mouth 3 (three) times daily. (Patient taking differently: Take 1 mg by mouth in the morning, at noon, in the evening, and at bedtime.) 30 tablet 0   diclofenac Sodium (VOLTAREN) 1 % GEL Apply 2 g topically 3 (three) times daily as needed (knee pain).     furosemide (LASIX) 40 MG tablet TAKE 2 TABLETS (80MG  TOTAL)TWO TIMES A DAY 360 tablet 2   [name] 1.5/30 1.5-30 MG-MCG tablet Take 1 tablet by mouth at bedtime.     levalbuterol (XOPENEX HFA) 45 MCG/ACT inhaler Inhale 2 puffs into the lungs every 6 (six) hours as needed for wheezing. 45 g 3   lidocaine (LIDODERM) 5 % Place 1 patch onto the skin daily as needed (for knee pain). Remove & Discard patch within 12 hours or as directed by MD 30 patch 0   losartan (COZAAR) 25 MG tablet Take 1 tablet (25 mg total) by mouth 2 (two) times  daily. Overdue for appointment. Please call to schedule 180 tablet 1   LYRICA 300 MG capsule Take 300 mg by mouth 2 (two) times daily.      MAGNESIUM PO Take 500 mg by mouth daily.      metFORMIN (GLUCOPHAGE) 500 MG tablet TAKE 1 TABLET TWICE DAILY  WITH MEALS 180 tablet 0   methocarbamol (ROBAXIN-750) 750 MG tablet Take 1 tablet (750 mg total) by mouth 3 (three) times daily as needed for muscle spasms. 90 tablet 1   Multiple Vitamin (MULTIVITAMIN) tablet  Take 1 tablet by mouth daily.     NUEDEXTA 20-10 MG CAPS Take 1 capsule by mouth every 12 (twelve) hours.   12   Omega-3 Fatty Acids (FISH OIL) 1200 MG CAPS Take 2,400 mg by mouth daily.      potassium chloride SA (KLOR-CON M20) 20 MEQ tablet TAKE 1 TABLET DAILY 90 tablet 3   VRAYLAR 6 MG CAPS Take 6 mg by mouth every evening.      Zinc 50 MG CAPS Take 50 mg by mouth daily.     No current facility-administered medications for this visit.    Allergies:   Savella [milnacipran hcl]    ROS:  Please see the history of present illness.   Otherwise, review of systems are positive for none.   All other systems are reviewed and negative.    PHYSICAL EXAM: VS:  BP (!) 154/86    Pulse 73    Ht 5\' 5"  (1.651 m)    Wt (!) 333 lb 6.4 oz (151.2 kg)    SpO2 97%    BMI 55.48 kg/m  , BMI Body mass index is 55.48 kg/m. GENERAL:  Well appearing NECK:  No jugular venous distention, waveform within normal limits, carotid upstroke brisk and symmetric, no bruits, no thyromegaly LUNGS:  Clear to auscultation bilaterally CHEST:  Unremarkable HEART:  PMI not displaced or sustained,S1 and S2 within normal limits, no S3, no S4, no clicks, no rubs, no murmurs ABD:  Flat, positive bowel sounds normal in frequency in pitch, no bruits, no rebound, no guarding, no midline pulsatile mass, no hepatomegaly, no splenomegaly EXT:  2 plus pulses throughout, no edema, no cyanosis no clubbing   EKG:  EKG is ordered today. The ekg ordered today demonstrates sinus rhythm,  rate 73, axis within normal limits intervals within normal limits, no acute ST-T wave changes.   Recent Labs: 05/20/2020: ALT 34; BUN 16; Creatinine, Ser 1.02; Potassium 4.2; Sodium 144    Lipid Panel    Component Value Date/Time   CHOL 166 05/20/2020 1319   TRIG 210.0 (H) 05/20/2020 1319   HDL 70.10 05/20/2020 1319   CHOLHDL 2 05/20/2020 1319   VLDL 42.0 (H) 05/20/2020 1319   LDLCALC 76 07/11/2017 1538   LDLDIRECT 84.0 05/20/2020 1319      Wt Readings from Last 3 Encounters:  02/14/21 (!) 333 lb 6.4 oz (151.2 kg)  12/28/20 (!) 335 lb 9.6 oz (152.2 kg)  04/06/20 (!) 335 lb (152 kg)      Other studies Reviewed: Additional studies/ records that were reviewed today include: Labs. Review of the above records demonstrates:  Please see elsewhere in the note.     ASSESSMENT AND PLAN:  Cardiomyopathy - Her ejection fractio in February of last year was back to normal.  There was no significant pulmonary hypertension.   No change in therapy.    Pulmonary HTN - We will follow this clinically and with repeat echocardiogram as indicated.   HTN -  Her blood pressure is well controlled at home by her report.  It is elevated today because she just had some stressful news.  She can watch this at home.   Sleep apnea-  She uses her CPAP.  No change in therapy.    Current medicines are reviewed at length with the patient today.  The patient does not have concerns regarding medicines.  The following changes have been made:  no change  Labs/ tests ordered today include: None  Orders Placed This Encounter  Procedures   EKG 12-Lead     Disposition:   FU with me in 18 months.     Signed, Minus Breeding, MD  02/14/2021 5:13 PM    [facility]

## 2021-02-14 ENCOUNTER — Ambulatory Visit (INDEPENDENT_AMBULATORY_CARE_PROVIDER_SITE_OTHER): Payer: BC Managed Care – PPO | Admitting: Psychology

## 2021-02-14 ENCOUNTER — Other Ambulatory Visit: Payer: Self-pay

## 2021-02-14 ENCOUNTER — Encounter: Payer: Self-pay | Admitting: Cardiology

## 2021-02-14 ENCOUNTER — Ambulatory Visit (INDEPENDENT_AMBULATORY_CARE_PROVIDER_SITE_OTHER): Payer: BC Managed Care – PPO | Admitting: Cardiology

## 2021-02-14 VITALS — BP 154/86 | HR 73 | Ht 65.0 in | Wt 333.4 lb

## 2021-02-14 DIAGNOSIS — I272 Pulmonary hypertension, unspecified: Secondary | ICD-10-CM

## 2021-02-14 DIAGNOSIS — I10 Essential (primary) hypertension: Secondary | ICD-10-CM | POA: Diagnosis not present

## 2021-02-14 DIAGNOSIS — I420 Dilated cardiomyopathy: Secondary | ICD-10-CM | POA: Diagnosis not present

## 2021-02-14 DIAGNOSIS — F319 Bipolar disorder, unspecified: Secondary | ICD-10-CM

## 2021-02-14 DIAGNOSIS — G4730 Sleep apnea, unspecified: Secondary | ICD-10-CM

## 2021-02-14 NOTE — Progress Notes (Addendum)
[name] is a 51 y.o. female patient   Treatment Plan: Date: 02/14/2021  Diagnosis 296.80 (Unspecified bipolar or related disorder) [n/a]  Symptoms Depressed or irritable mood. (Status: maintained) -- No Description Entered  Diminished interest in or enjoyment of activities. (Status: maintained) -- No Description Entered  Lack of energy. (Status: maintained) -- No Description Entered  Low self-esteem. (Status: maintained) -- No Description Entered  Medication Status compliance  Safety none  If Suicidal or Homicidal State Action Taken: unspecified  Current Risk: low Medications Adderall (Dosage: 20mg  4X/day)  Klonapin (Dosage: 1mg  3X/day)  Lamictal (Dosage: 200mg  2x/day)  Nuedexta (Dosage: 20mg )  Vyralar (Dosage: 6mg )  Xanax (Dosage: 1mg  3X/day)  Objectives Related Problem: Develop healthy cognitive patterns and beliefs about self and the world that lead to alleviation and help prevent the relapse of mood episodes. Description: State no longer having thoughts of self-harm. Target Date: 2022-01-07 Frequency: Daily Modality: individual Progress: 80%  Related Problem: Develop healthy cognitive patterns and beliefs about self and the world that lead to alleviation and help prevent the relapse of mood episodes. Description: Verbalize any history of past and present suicidal thoughts and actions. Target Date: 2022-01-07 Frequency: Daily Modality: individual Progress: 80%  Related Problem: Develop healthy cognitive patterns and beliefs about self and the world that lead to alleviation and help prevent the relapse of mood episodes. Description: Take prescribed medications as directed. Target Date: 2022-01-07 Frequency: Daily Modality: individual Progress: 90%  Related Problem: Develop healthy cognitive patterns and beliefs about self and the world that lead to alleviation and help prevent the relapse of mood episodes. Description: Discuss and resolve troubling personal  and interpersonal issues. Target Date: 2022-01-07 Frequency: Daily Modality: individual Progress: 80%  Client Response full compliance  Service Location Location, [name] [place], [place] [address]  Service Code cpt [phone]  Emotion regulation skills  Related past to present  Rationally challenge thoughts or beliefs/cognitive restructuring  Identify/label emotions  Validate/empathize  Self care activities  Lifestyle change (exercise, nutrition)  Self-monitoring  Facilitate problem solving  Session notes:  f31.9 Bipolar Unspecified.  Goals: Mood stabilization, improved marital relationship, improve self esteem and elimination of self harm thoughts/tendencies. Also, patient struggles with leaving home comfortably and seeks to improve this ability. Target date for these goals is January, 2023. In addition, she needs to improve self care behaviors due to her weight and various medical conditions. Target date for this goal is Feb., 2022. Date revised to January, 2023 to allow time for behavioral implementation. Reports periodic outbursts of rage/anger. Seeks to minimize. Target date is January, 2023. Patient has realized improvement in all areas. Requests additional treatment to facilitate maintenance of progress. Goal date 12-23  Meds: Vyralar (6mg ), Lamictal (100mg  2X/day), Nuedexta (40+20mg /day), Adderall (20mg  4X/day), Xanax (1mg , 3X/day), and Klonopin (1mg  3X/day).  Patient has requested a video Webex session due to the corona virus. She is at home and I am in my home office.  [name] just found out that [name] got laid off from work. He got a severance package, but she does not know of the terms. There are other departments in the company that are hiring which he may consider applying to, but will evaluate and decide. She says [name] seems okay with it but she is nervous. [name] is telling her they will be fine, but she has trouble believing it. She is also distressed about her parent's  health. She feels that she or one of her sibs is going to have to  take her parents into their home to care for them. [name] says they do not have room to take them in. She says her sister has the room. I suggested that they set up a time for the siblings to meet to formulate a plan. She agrees and will move forward.                    Jessica Morel, PhD  Time:2:15-3:00 45 minutes.                      Jessica Morel, PhD

## 2021-02-14 NOTE — Patient Instructions (Signed)
Medication Instructions:  Your physician recommends that you continue on your current medications as directed. Please refer to the Current Medication list given to you today.  *If you need a refill on your cardiac medications before your next appointment, please call your pharmacy*   Lab Work: NONE If you have labs (blood work) drawn today and your tests are completely normal, you will receive your results only by: [place] (if you have MyChart) OR A paper copy in the mail If you have any lab test that is abnormal or we need to change your treatment, we will call you to review the results.   Testing/Procedures: NONE   Follow-Up: At [facility], you and your health needs are our priority.  As part of our continuing mission to provide you with exceptional heart care, we have created designated Provider Care Teams.  These Care Teams include your primary Cardiologist (physician) and Advanced Practice Providers (APPs -  Physician Assistants and Nurse Practitioners) who all work together to provide you with the care you need, when you need it.  We recommend signing up for the patient portal called "MyChart".  Sign up information is provided on this After Visit Summary.  MyChart is used to connect with patients for Virtual Visits (Telemedicine).  Patients are able to view lab/test results, encounter notes, upcoming appointments, etc.  Non-urgent messages can be sent to your provider as well.   To learn more about what you can do with MyChart, go to [email or web address].    Your next appointment:    18 month(s)  The format for your next appointment:   In Person  Provider:   Minus Breeding, MD

## 2021-02-21 ENCOUNTER — Ambulatory Visit (INDEPENDENT_AMBULATORY_CARE_PROVIDER_SITE_OTHER): Payer: BC Managed Care – PPO | Admitting: Psychology

## 2021-02-21 DIAGNOSIS — F319 Bipolar disorder, unspecified: Secondary | ICD-10-CM | POA: Diagnosis not present

## 2021-02-21 NOTE — Progress Notes (Addendum)
[name] is a 51 y.o. female patient   Treatment Plan: Date: 02/21/2021  Diagnosis 296.80 (Unspecified bipolar or related disorder) [n/a]  Symptoms Depressed or irritable mood. (Status: maintained) -- No Description Entered  Diminished interest in or enjoyment of activities. (Status: maintained) -- No Description Entered  Lack of energy. (Status: maintained) -- No Description Entered  Low self-esteem. (Status: maintained) -- No Description Entered  Medication Status compliance  Safety none  If Suicidal or Homicidal State Action Taken: unspecified  Current Risk: low Medications Adderall (Dosage: 20mg  4X/day)  Klonapin (Dosage: 1mg  3X/day)  Lamictal (Dosage: 200mg  2x/day)  Nuedexta (Dosage: 20mg )  Vyralar (Dosage: 6mg )  Xanax (Dosage: 1mg  3X/day)  Objectives Related Problem: Develop healthy cognitive patterns and beliefs about self and the world that lead to alleviation and help prevent the relapse of mood episodes. Description: State no longer having thoughts of self-harm. Target Date: 2022-01-07 Frequency: Daily Modality: individual Progress: 80%  Related Problem: Develop healthy cognitive patterns and beliefs about self and the world that lead to alleviation and help prevent the relapse of mood episodes. Description: Verbalize any history of past and present suicidal thoughts and actions. Target Date: 2022-01-07 Frequency: Daily Modality: individual Progress: 80%  Related Problem: Develop healthy cognitive patterns and beliefs about self and the world that lead to alleviation and help prevent the relapse of mood episodes. Description: Take prescribed medications as directed. Target Date: 2022-01-07 Frequency: Daily Modality: individual Progress: 90%  Related Problem: Develop healthy cognitive patterns and beliefs about self and the world that lead to alleviation and help prevent the relapse of mood episodes. Description: Discuss  and resolve troubling personal and interpersonal issues. Target Date: 2022-01-07 Frequency: Daily Modality: individual Progress: 80%  Client Response full compliance  Service Location Location, [name] [place], [place] [address]  Service Code cpt [phone]  Emotion regulation skills  Related past to present  Rationally challenge thoughts or beliefs/cognitive restructuring  Identify/label emotions  Validate/empathize  Self care activities  Lifestyle change (exercise, nutrition)  Self-monitoring  Facilitate problem solving  Session notes:  f31.9 Bipolar Unspecified.  Goals: Mood stabilization, improved marital relationship, improve self esteem and elimination of self harm thoughts/tendencies. Also, patient struggles with leaving home comfortably and seeks to improve this ability. Target date for these goals is January, 2023. In addition, she needs to improve self care behaviors due to her weight and various medical conditions. Target date for this goal is Feb., 2022. Date revised to January, 2023 to allow time for behavioral implementation. Reports periodic outbursts of rage/anger. Seeks to minimize. Target date is December, 2023. Patient has realized improvement in all areas. Requests additional treatment to facilitate maintenance of progress. Goal date 12-23  Meds: Vyralar (6mg ), Lamictal (100mg  2X/day), Nuedexta (40+20mg /day), Adderall (20mg  4X/day), Xanax (1mg , 3X/day), and Klonopin (1mg  3X/day).  Patient has requested a video Webex session due to the corona virus. She is at home and I am in my home office.  [name] says that [employer] job does not end until the end of the month. He remains optimistic, but she is still uncertain. [name] talked about her parents and some of her concerns. To date, no meeting has been set up among her siblings. She acknowledges that she is likely the one that will need to set it up. Dicussed what that agenda could/should look like.  Jessica Morel, PhD  Time:2:15-3:00 45 minutes.

## 2021-02-28 ENCOUNTER — Ambulatory Visit (INDEPENDENT_AMBULATORY_CARE_PROVIDER_SITE_OTHER): Payer: BC Managed Care – PPO | Admitting: Psychology

## 2021-02-28 DIAGNOSIS — F319 Bipolar disorder, unspecified: Secondary | ICD-10-CM | POA: Diagnosis not present

## 2021-02-28 NOTE — Progress Notes (Signed)
[name] is a 51 y.o. female patient   Treatment Plan: Date: 02/28/2021  Diagnosis 296.80 (Unspecified bipolar or related disorder) [n/a]  Symptoms Depressed or irritable mood. (Status: maintained) -- No Description Entered  Diminished interest in or enjoyment of activities. (Status: maintained) -- No Description Entered  Lack of energy. (Status: maintained) -- No Description Entered  Low self-esteem. (Status: maintained) -- No Description Entered  Medication Status compliance  Safety none  If Suicidal or Homicidal State Action Taken: unspecified  Current Risk: low Medications Adderall (Dosage: 20mg  4X/day)  Klonapin (Dosage: 1mg  3X/day)  Lamictal (Dosage: 200mg  2x/day)  Nuedexta (Dosage: 20mg )  Vyralar (Dosage: 6mg )  Xanax (Dosage: 1mg  3X/day)  Objectives Related Problem: Develop healthy cognitive patterns and beliefs about self and the world that lead to alleviation and help prevent the relapse of mood episodes. Description: State no longer having thoughts of self-harm. Target Date: 2021-02-07 Frequency: Daily Modality: individual Progress: 80%  Related Problem: Develop healthy cognitive patterns and beliefs about self and the world that lead to alleviation and help prevent the relapse of mood episodes. Description: Verbalize any history of past and present suicidal thoughts and actions. Target Date: 2021-02-07 Frequency: Daily Modality: individual Progress: 80%  Related Problem: Develop healthy cognitive patterns and beliefs about self and the world that lead to alleviation and help prevent the relapse of mood episodes. Description: Take prescribed medications as directed. Target Date: 2021-02-07 Frequency: Daily Modality: individual Progress: 90%  Related Problem: Develop healthy cognitive patterns and beliefs about self and the world that lead to alleviation and help prevent the relapse of mood  episodes. Description: Discuss and resolve troubling personal and interpersonal issues. Target Date: 2021-02-07 Frequency: Daily Modality: individual Progress: 80%  Client Response full compliance  Service Location Location, [name] [place], [place] [address]  Service Code cpt [phone]  Emotion regulation skills  Related past to present  Rationally challenge thoughts or beliefs/cognitive restructuring  Identify/label emotions  Validate/empathize  Self care activities  Lifestyle change (exercise, nutrition)  Self-monitoring  Facilitate problem solving  Session notes:  f31.9 Bipolar Unspecified.  Goals: Mood stabilization, improved marital relationship, improve self esteem and elimination of self harm thoughts/tendencies. Also, patient struggles with leaving home comfortably and seeks to improve this ability. Target date for these goals is January, 2023. In addition, she needs to improve self care behaviors due to her weight and various medical conditions. Target date for this goal is Feb., 2022. Date revised to January, 2023 to allow time for behavioral implementation. Reports periodic outbursts of rage/anger. Seeks to minimize. Target date is January, 2023. Patient has realized improvement in all areas. Requests additional treatment to facilitate maintenance of progress. Goal date 1-23  Meds: Vyralar (6mg ), Lamictal (100mg  2X/day), Nuedexta (40+20mg /day), Adderall (20mg  4X/day), Xanax (1mg , 3X/day), and Klonopin (1mg  3X/day).  Patient has requested a video Webex session due to the corona virus. She is at home and I am in my home office.  Freddie says that today is [name]'s last at his job. He has not yet started looking for a new position, but is not especially concerned. They will celebrate his 24 yr 11 month run at [employer].He has 6 months of severance. Her mother does not need to have kidney stone surgery. Christionna states that she has not moved on gathering her siblings to discuss her  parents. Says she will wait until  her brother completes his job at store that is going out of business. She is less than enthusiastic about calling this meeting, but has no alternative plan on how to help parents.                          [name], PhD  Time:2:15-3:00 45 minutes.

## 2021-03-07 ENCOUNTER — Ambulatory Visit (INDEPENDENT_AMBULATORY_CARE_PROVIDER_SITE_OTHER): Payer: BC Managed Care – PPO | Admitting: Psychology

## 2021-03-07 DIAGNOSIS — F319 Bipolar disorder, unspecified: Secondary | ICD-10-CM | POA: Diagnosis not present

## 2021-03-07 NOTE — Progress Notes (Signed)
[name] is a 51 y.o. female patient   Treatment Plan: Date: 03/07/2021  Diagnosis 296.80 (Unspecified bipolar or related disorder) [n/a]  Symptoms Depressed or irritable mood. (Status: maintained) -- No Description Entered  Diminished interest in or enjoyment of activities. (Status: maintained) -- No Description Entered  Lack of energy. (Status: maintained) -- No Description Entered  Low self-esteem. (Status: maintained) -- No Description Entered  Medication Status compliance  Safety none  If Suicidal or Homicidal State Action Taken: unspecified  Current Risk: low Medications Adderall (Dosage: 20mg  4X/day)  Klonapin (Dosage: 1mg  3X/day)  Lamictal (Dosage: 200mg  2x/day)  Nuedexta (Dosage: 20mg )  Vyralar (Dosage: 6mg )  Xanax (Dosage: 1mg  3X/day)  Objectives Related Problem: Develop healthy cognitive patterns and beliefs about self and the world that lead to alleviation and help prevent the relapse of mood episodes. Description: State no longer having thoughts of self-harm. Target Date: 2022-01-07 Frequency: Daily Modality: individual Progress: 80%  Related Problem: Develop healthy cognitive patterns and beliefs about self and the world that lead to alleviation and help prevent the relapse of mood episodes. Description: Verbalize any history of past and present suicidal thoughts and actions. Target Date: 2022-01-07 Frequency: Daily Modality: individual Progress: 80%  Related Problem: Develop healthy cognitive patterns and beliefs about self and the world that lead to alleviation and help prevent the relapse of mood episodes. Description: Take prescribed medications as directed. Target Date: 2022-01-07 Frequency: Daily Modality: individual Progress: 90%  Related Problem: Develop healthy cognitive patterns and beliefs about self and the world that lead to alleviation and help prevent the relapse of mood  episodes. Description: Discuss and resolve troubling personal and interpersonal issues. Target Date: 2022-01-07 Frequency: Daily Modality: individual Progress: 80%  Client Response full compliance  Service Location Location, [name] [place], [place] [address]  Service Code cpt [phone]  Emotion regulation skills  Related past to present  Rationally challenge thoughts or beliefs/cognitive restructuring  Identify/label emotions  Validate/empathize  Self care activities  Lifestyle change (exercise, nutrition)  Self-monitoring  Facilitate problem solving  Session notes:  f31.9 Bipolar Unspecified.  Goals: Mood stabilization, improved marital relationship, improve self esteem and elimination of self harm thoughts/tendencies. Also, patient struggles with leaving home comfortably and seeks to improve this ability. Target date for these goals is January, 2023. In addition, she needs to improve self care behaviors due to her weight and various medical conditions. Target date for this goal is Feb., 2022. Date revised to December, 2023 to allow time for behavioral implementation. Reports periodic outbursts of rage/anger. Seeks to minimize. Target date is December, 2023. Patient has realized improvement in all areas. Requests additional treatment to facilitate maintenance of progress. Goal date 12-23  Meds: Vyralar (6mg ), Lamictal (100mg  2X/day), Nuedexta (40+20mg /day), Adderall (20mg  4X/day), Xanax (1mg , 3X/day), and Klonopin (1mg  3X/day).  Patient has requested a video Webex session due to the corona virus. She is at home and I am in my home office.  [name] says that she "had to go help parents" yesterday to watch her mother while her father had an appointment. She sees Dr. Toy Care Thursday (virtual). Dazha thinks that Dr. Toy Care wants her to come off of Xanax and Klonapin. [name] is spending a lot of time cleaning out his office, which takes up most of his day. He has not started job hunting.  Neither are very concerned  about their financial future, but he will, according to [facility], seek out some form of employment. [name] still has not initiated any conversation with sibs about long term care of her parents. She is avoiding this conversation but is not clear why. She says "they won't do anything about it". She did say "they are the real siblings, not me". She recognizes that is an excuse and not the real reason. She is sure "it will not be a happy ending" and thinks they are all avoiding the issue. Again emphasized the importance of addressing the concerns she has for her parents.                            [name], PhD  Time:2:15-3:00 45 minutes.

## 2021-03-14 ENCOUNTER — Ambulatory Visit (INDEPENDENT_AMBULATORY_CARE_PROVIDER_SITE_OTHER): Payer: BC Managed Care – PPO | Admitting: Psychology

## 2021-03-14 DIAGNOSIS — F319 Bipolar disorder, unspecified: Secondary | ICD-10-CM | POA: Diagnosis not present

## 2021-03-14 NOTE — Progress Notes (Signed)
[name] is a 51 y.o. female patient   Treatment Plan: Date: 03/14/2021  Diagnosis 296.80 (Unspecified bipolar or related disorder) [n/a]  Symptoms Depressed or irritable mood. (Status: maintained) -- No Description Entered  Diminished interest in or enjoyment of activities. (Status: maintained) -- No Description Entered  Lack of energy. (Status: maintained) -- No Description Entered  Low self-esteem. (Status: maintained) -- No Description Entered  Medication Status compliance  Safety none  If Suicidal or Homicidal State Action Taken: unspecified  Current Risk: low Medications Adderall (Dosage: 20mg  4X/day)  Klonapin (Dosage: 1mg  3X/day)  Lamictal (Dosage: 200mg  2x/day)  Nuedexta (Dosage: 20mg )  Vyralar (Dosage: 6mg )  Xanax (Dosage: 1mg  3X/day)  Objectives Related Problem: Develop healthy cognitive patterns and beliefs about self and the world that lead to alleviation and help prevent the relapse of mood episodes. Description: State no longer having thoughts of self-harm. Target Date: 2022-01-07 Frequency: Daily Modality: individual Progress: 80%  Related Problem: Develop healthy cognitive patterns and beliefs about self and the world that lead to alleviation and help prevent the relapse of mood episodes. Description: Verbalize any history of past and present suicidal thoughts and actions. Target Date: 2022-01-07 Frequency: Daily Modality: individual Progress: 80%  Related Problem: Develop healthy cognitive patterns and beliefs about self and the world that lead to alleviation and help prevent the relapse of mood episodes. Description: Take prescribed medications as directed. Target Date: 2022-01-07 Frequency: Daily Modality: individual Progress: 90%  Related Problem: Develop healthy cognitive patterns and beliefs about self and the world that lead to alleviation and help prevent the relapse of mood episodes. Description:  Discuss and resolve troubling personal and interpersonal issues. Target Date: 2022-01-07 Frequency: Daily Modality: individual Progress: 80%  Client Response full compliance  Service Location Location, [name] [place], [place] [address]  Service Code cpt [phone]  Emotion regulation skills  Related past to present  Rationally challenge thoughts or beliefs/cognitive restructuring  Identify/label emotions  Validate/empathize  Self care activities  Lifestyle change (exercise, nutrition)  Self-monitoring  Facilitate problem solving  Session notes:  f31.9 Bipolar Unspecified.  Goals: Mood stabilization, improved marital relationship, improve self esteem and elimination of self harm thoughts/tendencies. Also, patient struggles with leaving home comfortably and seeks to improve this ability. Target date for these goals is January, 2023. In addition, she needs to improve self care behaviors due to her weight and various medical conditions. Target date for this goal is Feb., 2022. Date revised to December, 2023 to allow time for behavioral implementation. Reports periodic outbursts of rage/anger. Seeks to minimize. Target date is December, 2023. Patient has realized improvement in all areas. Requests additional treatment to facilitate maintenance of progress. Goal date 12-23  Meds: Vyralar (6mg ), Lamictal (100mg  2X/day), Nuedexta (40+20mg /day), Adderall (20mg  4X/day), Xanax (1mg , 3X/day), and Klonopin (1mg  3X/day).  Patient has requested a video Webex session due to the corona virus. She is at home and I am in my home office.  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 She hasn't tutored in 5 years. She prefers not to go to people's houses due to Covid 19, but she does want to tutor. We talked about her having the right to make decisions about whatever precautions will make her most comfortable. Will likely start this week. This represents a positive step  forward with regard to her confidence.  [name], PhD  Time:2:15-3:00 45 minutes.

## 2021-03-15 ENCOUNTER — Other Ambulatory Visit: Payer: Self-pay | Admitting: Cardiology

## 2021-03-16 ENCOUNTER — Telehealth: Payer: Self-pay | Admitting: Family Medicine

## 2021-03-16 NOTE — Telephone Encounter (Signed)
Left message for patient to call back and schedule Medicare Annual Wellness Visit (AWV) either virtually or in office. Left  my Herbie Drape number [phone]   Last AWV 03/28/20  please schedule at anytime with [facility] Nurse Health Advisor 1 or 2   This should be a 45 minute visit.

## 2021-03-21 ENCOUNTER — Ambulatory Visit (INDEPENDENT_AMBULATORY_CARE_PROVIDER_SITE_OTHER): Payer: Medicare Other | Admitting: Psychology

## 2021-03-21 DIAGNOSIS — F319 Bipolar disorder, unspecified: Secondary | ICD-10-CM | POA: Diagnosis not present

## 2021-03-21 NOTE — Progress Notes (Signed)
[name] is a 52 y.o. female patient   Treatment Plan: Date: 03/21/2021  Diagnosis 296.80 (Unspecified bipolar or related disorder) [n/a]  Symptoms Depressed or irritable mood. (Status: maintained) -- No Description Entered  Diminished interest in or enjoyment of activities. (Status: maintained) -- No Description Entered  Lack of energy. (Status: maintained) -- No Description Entered  Low self-esteem. (Status: maintained) -- No Description Entered  Medication Status compliance  Safety none  If Suicidal or Homicidal State Action Taken: unspecified  Current Risk: low Medications Adderall (Dosage: 20mg  4X/day)  Klonapin (Dosage: 1mg  3X/day)  Lamictal (Dosage: 200mg  2x/day)  Nuedexta (Dosage: 20mg )  Vyralar (Dosage: 6mg )  Xanax (Dosage: 1mg  3X/day)  Objectives Related Problem: Develop healthy cognitive patterns and beliefs about self and the world that lead to alleviation and help prevent the relapse of mood episodes. Description: State no longer having thoughts of self-harm. Target Date: 2022-01-07 Frequency: Daily Modality: individual Progress: 80%  Related Problem: Develop healthy cognitive patterns and beliefs about self and the world that lead to alleviation and help prevent the relapse of mood episodes. Description: Verbalize any history of past and present suicidal thoughts and actions. Target Date: 2022-01-07 Frequency: Daily Modality: individual Progress: 80%  Related Problem: Develop healthy cognitive patterns and beliefs about self and the world that lead to alleviation and help prevent the relapse of mood episodes. Description: Take prescribed medications as directed. Target Date: 2022-01-07 Frequency: Daily Modality: individual Progress: 90%  Related Problem: Develop healthy cognitive patterns and beliefs about self and the world that lead to alleviation and help prevent the relapse of mood episodes. Description:  Discuss and resolve troubling personal and interpersonal issues. Target Date: 2022-01-07 Frequency: Daily Modality: individual Progress: 80%  Client Response full compliance  Service Location Location, [name] [place], [place] [address]  Service Code cpt [phone]  Emotion regulation skills  Related past to present  Rationally challenge thoughts or beliefs/cognitive restructuring  Identify/label emotions  Validate/empathize  Self care activities  Lifestyle change (exercise, nutrition)  Self-monitoring  Facilitate problem solving  Session notes:  f31.9 Bipolar Unspecified.  Goals: Mood stabilization, improved marital relationship, improve self esteem and elimination of self harm thoughts/tendencies. Also, patient struggles with leaving home comfortably and seeks to improve this ability. Target date for these goals is January, 2023. In addition, she needs to improve self care behaviors due to her weight and various medical conditions. Target date for this goal is Feb., 2022. Date revised to December, 2023 to allow time for behavioral implementation. Reports periodic outbursts of rage/anger. Seeks to minimize. Target date is December, 2023. Patient has realized improvement in all areas. Requests additional treatment to facilitate maintenance of progress. Goal date 12-23  Meds: Vyralar (6mg ), Lamictal (100mg  2X/day), Nuedexta (40+20mg /day), Adderall (20mg  4X/day), Xanax (1mg , 3X/day), and Klonopin (1mg  3X/day).  Patient has requested a video Webex session due to the corona virus. She is at home and I am in my home office.  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 She is disappointed because it appears that she is not going to be working with the family. States she may start to advertise again, as she did on the past. She said that [name] still has not yet figured out what he is doing next for work. She is not especially concerned as [name] reassures her they will be  okay. He is "keeping  busy" but not actively seeking jobs. [name] reports that she is feeling stable. Continues to be compliant with medication.                                   [name], PhD  Time:2:20-3:05 45 minutes.

## 2021-03-28 ENCOUNTER — Ambulatory Visit (INDEPENDENT_AMBULATORY_CARE_PROVIDER_SITE_OTHER): Payer: Medicare Other | Admitting: Psychology

## 2021-03-28 DIAGNOSIS — F319 Bipolar disorder, unspecified: Secondary | ICD-10-CM

## 2021-03-28 NOTE — Progress Notes (Signed)
[name] is a 51 y.o. female patient   Treatment Plan: Date: 03/28/2021  Diagnosis 296.80 (Unspecified bipolar or related disorder) [n/a]  Symptoms Depressed or irritable mood. (Status: maintained) -- No Description Entered  Diminished interest in or enjoyment of activities. (Status: maintained) -- No Description Entered  Lack of energy. (Status: maintained) -- No Description Entered  Low self-esteem. (Status: maintained) -- No Description Entered  Medication Status compliance  Safety none  If Suicidal or Homicidal State Action Taken: unspecified  Current Risk: low Medications Adderall (Dosage: 20mg  4X/day)  Klonapin (Dosage: 1mg  3X/day)  Lamictal (Dosage: 200mg  2x/day)  Nuedexta (Dosage: 20mg )  Vyralar (Dosage: 6mg )  Xanax (Dosage: 1mg  3X/day)  Objectives Related Problem: Develop healthy cognitive patterns and beliefs about self and the world that lead to alleviation and help prevent the relapse of mood episodes. Description: State no longer having thoughts of self-harm. Target Date: 2022-01-07 Frequency: Daily Modality: individual Progress: 80%  Related Problem: Develop healthy cognitive patterns and beliefs about self and the world that lead to alleviation and help prevent the relapse of mood episodes. Description: Verbalize any history of past and present suicidal thoughts and actions. Target Date: 2022-01-07 Frequency: Daily Modality: individual Progress: 80%  Related Problem: Develop healthy cognitive patterns and beliefs about self and the world that lead to alleviation and help prevent the relapse of mood episodes. Description: Take prescribed medications as directed. Target Date: 2022-01-07 Frequency: Daily Modality: individual Progress: 90%  Related Problem: Develop healthy cognitive patterns and beliefs about self and the world that lead to alleviation and help prevent the relapse of mood episodes. Description: Discuss and  resolve troubling personal and interpersonal issues. Target Date: 2022-01-07 Frequency: Daily Modality: individual Progress: 80%  Client Response full compliance  Service Location Location, [name] [place], [place] [address]  Service Code cpt [phone]  Emotion regulation skills  Related past to present  Rationally challenge thoughts or beliefs/cognitive restructuring  Identify/label emotions  Validate/empathize  Self care activities  Lifestyle change (exercise, nutrition)  Self-monitoring  Facilitate problem solving  Session notes:  f31.9 Bipolar Unspecified.  Goals: Mood stabilization, improved marital relationship, improve self esteem and elimination of self harm thoughts/tendencies. Also, patient struggles with leaving home comfortably and seeks to improve this ability. Target date for these goals is January, 2023. In addition, she needs to improve self care behaviors due to her weight and various medical conditions. Target date for this goal is Feb., 2022. Date revised to December, 2023 to allow time for behavioral implementation. Reports periodic outbursts of rage/anger. Seeks to minimize. Target date is December, 2023. Patient has realized improvement in all areas. Requests additional treatment to facilitate maintenance of progress. Goal date 12-23  Meds: Vyralar (6mg ), Lamictal (100mg  2X/day), Nuedexta (40+20mg /day), Adderall (20mg  4X/day), Xanax (1mg , 3X/day), and Klonopin (1mg  3X/day).  Patient has requested a video Webex session due to the corona virus. She is at home and I am in my home office.  Tevis says that [name] is still not worried about finding work. He is being cautious about spending, but is not anxious. She has an upcoming Medicare appointment, as she does every year. She is feeling reasonably well and says she is going out more and is considering "expanding" her range of activities.  [name], PhD   Time:2:20-3:10 50 minutes.

## 2021-03-30 ENCOUNTER — Ambulatory Visit (INDEPENDENT_AMBULATORY_CARE_PROVIDER_SITE_OTHER): Payer: BC Managed Care – PPO

## 2021-03-30 VITALS — Ht 65.0 in | Wt 333.0 lb

## 2021-03-30 DIAGNOSIS — Z1211 Encounter for screening for malignant neoplasm of colon: Secondary | ICD-10-CM

## 2021-03-30 DIAGNOSIS — Z0000 Encounter for general adult medical examination without abnormal findings: Secondary | ICD-10-CM | POA: Diagnosis not present

## 2021-03-30 NOTE — Progress Notes (Signed)
Subjective:   [name] is a 51 y.o. female who presents for Medicare Annual (Subsequent) preventive examination.  Review of Systems    Virtual Visit via Telephone Note  I connected with  [name] on 03/30/21 at  2:00 PM EST by telephone and verified that I am speaking with the correct person using two identifiers.  Location: Patient: Home Provider: Office Persons participating in the virtual visit: patient/Nurse Health Advisor   I discussed the limitations, risks, security and privacy concerns of performing an evaluation and management service by telephone and the availability of in person appointments. The patient expressed understanding and agreed to proceed.  Interactive audio and video telecommunications were attempted between this nurse and patient, however failed, due to patient having technical difficulties OR patient did not have access to video capability.  We continued and completed visit with audio only.  Some vital signs may be absent or patient reported.   Criselda Peaches, LPN  Cardiac Risk Factors include: hypertension;obesity (BMI >30kg/m2)     Objective:    Today's Vitals   03/30/21 1403 03/30/21 1404  Weight: (!) 333 lb (151 kg)   Height: 5\' 5"  (1.651 m)   PainSc:  3    Body mass index is 55.41 kg/m.  Advanced Directives 03/30/2021 03/28/2020 05/17/2019 05/11/2019 11/24/2015 08/26/2015 06/23/2015  Does Patient Have a Medical Advance Directive? Yes Yes No No No No No  Type of Paramedic of [place];Living will [place];Living will - - - - -  Does patient want to make changes to medical advance directive? No - Patient declined No - Patient declined - - - - -  Copy of [place] in Chart? No - copy requested No - copy requested - - - - -  Would patient like information on creating a medical advance directive? - - - Yes (MAU/Ambulatory/Procedural Areas - Information given) (No Data)  No - patient declined information -  Pre-existing out of facility DNR order (yellow form or pink MOST form) - - - - - - -  Some encounter information is confidential and restricted. Go to Review Flowsheets activity to see all data.    Current Medications (verified) Outpatient Encounter Medications as of 03/30/2021  Medication Sig   acetaminophen (TYLENOL) 500 MG tablet Take 1,000 mg by mouth every 6 (six) hours as needed for moderate pain.    albuterol (VENTOLIN HFA) 108 (90 Base) MCG/ACT inhaler Inhale 2 puffs into the lungs every 6 (six) hours as needed for wheezing or shortness of breath.   ALPRAZolam (XANAX) 1 MG tablet Take 1 mg by mouth 3 (three) times daily.   amphetamine-dextroamphetamine (ADDERALL) 20 MG tablet Take 20 mg by mouth in the morning, at noon, in the evening, and at bedtime.    Ascorbic Acid (VITAMIN C) 500 MG tablet Take 1,000 mg by mouth daily.   aspirin 81 MG tablet Take 1 tablet (81 mg total) by mouth daily.   atorvastatin (LIPITOR) 20 MG tablet TAKE 1 TABLET BY MOUTH EVERY DAY IN THE EVENING   b complex vitamins tablet Take 1 tablet by mouth daily.   Calcium Citrate (CITRACAL PO) Take 2 tablets by mouth daily.   Cholecalciferol (VITAMIN D3) 5000 UNITS TABS Take 10,000 Units by mouth in the morning and at bedtime.   Chromium Picolinate 500 MCG TABS Take 500 mcg by mouth daily.    clonazePAM (KLONOPIN) 1 MG tablet Take 1 tablet (1 mg total) by mouth  3 (three) times daily. (Patient taking differently: Take 1 mg by mouth in the morning, at noon, in the evening, and at bedtime.)   diclofenac Sodium (VOLTAREN) 1 % GEL Apply 2 g topically 3 (three) times daily as needed (knee pain).   furosemide (LASIX) 40 MG tablet TAKE 2 TABLETS (80MG  TOTAL)TWO TIMES A DAY   [name] 1.5/30 1.5-30 MG-MCG tablet Take 1 tablet by mouth at bedtime.   levalbuterol (XOPENEX HFA) 45 MCG/ACT inhaler Inhale 2 puffs into the lungs every 6 (six) hours as needed for wheezing.   lidocaine (LIDODERM) 5 %  Place 1 patch onto the skin daily as needed (for knee pain). Remove & Discard patch within 12 hours or as directed by MD   losartan (COZAAR) 25 MG tablet Take 1 tablet (25 mg total) by mouth 2 (two) times daily. Overdue for appointment. Please call to schedule   LYRICA 300 MG capsule Take 300 mg by mouth 2 (two) times daily.    MAGNESIUM PO Take 500 mg by mouth daily.    metFORMIN (GLUCOPHAGE) 500 MG tablet TAKE 1 TABLET TWICE DAILY  WITH MEALS   methocarbamol (ROBAXIN-750) 750 MG tablet Take 1 tablet (750 mg total) by mouth 3 (three) times daily as needed for muscle spasms.   Multiple Vitamin (MULTIVITAMIN) tablet Take 1 tablet by mouth daily.   NUEDEXTA 20-10 MG CAPS Take 1 capsule by mouth every 12 (twelve) hours.    Omega-3 Fatty Acids (FISH OIL) 1200 MG CAPS Take 2,400 mg by mouth daily.    potassium chloride SA (KLOR-CON M20) 20 MEQ tablet TAKE 1 TABLET DAILY   VRAYLAR 6 MG CAPS Take 6 mg by mouth every evening.    Zinc 50 MG CAPS Take 50 mg by mouth daily.   No facility-administered encounter medications on file as of 03/30/2021.    Allergies (verified) Savella [milnacipran hcl]   History: Past Medical History:  Diagnosis Date   Acute systolic congestive heart failure (HCC)    Agoraphobia    Anxiety    panic attacks   Arthritis    osteoarthritis bilal. knees   Asthma    no per PFT 7/13; reports does not have asthma   Bipolar disorder ([place])    Carpal tunnel syndrome of left wrist 05/2011   Being evaluated for MS   Cor pulmonale ([place])    Depression    History of pleural effusion    Hyperlipidemia    Hypertension    Hypoxemia    history of - no home O2   IBS (irritable bowel syndrome)    Morbid obesity (HCC)    Obesity hypoventilation syndrome (HCC)    Pneumonia    Pre-diabetes    Prediabetes    Seasonal allergies    current runny nose   Seizures (HCC)    febrile seizure x 1 as a child. Several times   Sleep apnea sleep study 07/02/2010   uses CPAP nightly   Spinal  stenosis in cervical region    Urinary incontinence    Past Surgical History:  Procedure Laterality Date   APPLICATION OF INTRAOPERATIVE CT SCAN N/A 05/14/2019   Procedure: APPLICATION OF INTRAOPERATIVE CT SCAN;  Surgeon: Erline Levine, MD;  Location: [facility];  Service: Neurosurgery;  Laterality: N/A;   CARPAL TUNNEL RELEASE  06/27/2011   Procedure: CARPAL TUNNEL RELEASE;  Surgeon: Wynonia Sours, MD;  Location: [facility];  Service: Orthopedics;  Laterality: Left;   CARPAL TUNNEL RELEASE  12/19/2011   Procedure: CARPAL  TUNNEL RELEASE;  Surgeon: Wynonia Sours, MD;  Location: [facility];  Service: Orthopedics;  Laterality: Right;   INCISION AND DRAINAGE ABSCESS Left 08/27/2017   Procedure: INCISION AND DRAINAGE LEFT BUTTOCK  ABSCESS;  Surgeon: Excell Seltzer, MD;  Location: [facility];  Service: General;  Laterality: Left;   IRRIGATION AND DEBRIDEMENT BUTTOCKS Left 08/30/2017   Procedure: IRRIGATION AND DEBRIDEMENT RE-EXCISION OF LEFT SUBCUTANEOUS BUTTOCKS ABCESS;  Surgeon: Kieth Brightly Arta Bruce, MD;  Location: [facility];  Service: General;  Laterality: Left;   MASS EXCISION  08/07/2010   right index   POSTERIOR CERVICAL FUSION/FORAMINOTOMY  09/11/2011   Procedure: POSTERIOR CERVICAL FUSION/FORAMINOTOMY LEVEL 1;  Surgeon: Erline Levine, MD;  Location: [facility];  Service: Neurosurgery;  Laterality: N/A;  Cervical Three-Four Posteior Cervical Fusion and Decompression.   POSTERIOR CERVICAL FUSION/FORAMINOTOMY N/A 05/14/2019   Procedure: Posterior cervical decompression/fusion Cervical four to Thoracic one with exploration/revision of Cervical threee-four fusion;  Surgeon: Erline Levine, MD;  Location: [facility];  Service: Neurosurgery;  Laterality: N/A;   TRIGGER FINGER RELEASE  12/19/2011   Procedure: RELEASE TRIGGER FINGER/A-1 PULLEY;  Surgeon: Wynonia Sours, MD;  Location: [facility];  Service: Orthopedics;  Laterality: Right;   TRIGGER FINGER RELEASE Left 12/31/2012    Procedure: RELEASE A-1 PULLEY LEFT THUMB;  Surgeon: Wynonia Sours, MD;  Location: [facility];  Service: Orthopedics;  Laterality: Left;   WISDOM TOOTH EXTRACTION     Family History  Problem Relation Age of Onset   Asthma Mother    Allergic rhinitis Mother    Multiple sclerosis Mother    Asthma Maternal Grandmother    Social History   Socioeconomic History   Marital status: Married    Spouse name: Not on file   Number of children: Not on file   Years of education: Not on file   Highest education level: Not on file  Occupational History   Occupation: currently unemployed  Tobacco Use   Smoking status: Never   Smokeless tobacco: Never  Substance and Sexual Activity   Alcohol use: Yes    Comment: rare   Drug use: No   Sexual activity: Not on file  Other Topics Concern   Not on file  Social History Narrative   Lives in [place] Strain: Low Risk    Difficulty of Paying Living Expenses: Not very hard  Food Insecurity: No Food Insecurity   Worried About Charity fundraiser in the Last Year: Never true   [place] in the Last Year: Never true  Transportation Needs: No Transportation Needs   Lack of Transportation (Medical): No   Lack of Transportation (Non-Medical): No  Physical Activity: Inactive   Days of Exercise per Week: 0 days   Minutes of Exercise per Session: 0 min  Stress: Stress Concern Present   Feeling of Stress : To some extent  Social Connections: Moderately Isolated   Frequency of Communication with Friends and Family: More than three times a week   Frequency of Social Gatherings with Friends and Family: More than three times a week   Attends Religious Services: Never   Marine scientist or Organizations: No   Attends Archivist Meetings: Never   Marital Status: Married    Tobacco Counseling Counseling given: Not Answered   Clinical Intake:  Pre-visit  preparation completed: Yes  Pain : 0-10 Pain  Score: 3  Pain Type: Chronic pain Pain Location: Neck Pain Orientation: Left Pain Descriptors / Indicators: Constant Pain Onset: Other (comment) (Neck surgery 4/21) Pain Frequency: Constant Pain Relieving Factors: OTC meds Effect of Pain on Daily Activities: n/a  Pain Relieving Factors: OTC meds  BMI - recorded: 55.48 Nutritional Status: BMI > 30  Obese Nutritional Risks: None Diabetes: Yes CBG done?: No Did pt. bring in CBG monitor from home?: No  How often do you need to have someone help you when you read instructions, pamphlets, or other written materials from your doctor or pharmacy?: 1 - Never  Diabetic?  Yes  Interpreter Needed?: NoNutrition Risk Assessment:  Has the patient had any N/V/D within the last 2 months?  No  Does the patient have any non-healing wounds?  No  Has the patient had any unintentional weight loss or weight gain?  No   Diabetes:  Is the patient diabetic?  Yes  If diabetic, was a CBG obtained today?  No  Did the patient bring in their glucometer from home?  No  How often do you monitor your CBG's? N/A.   Financial Strains and Diabetes Management:  Are you having any financial strains with the device, your supplies or your medication? No .  Does the patient want to be seen by Chronic Care Management for management of their diabetes?  No  Would the patient like to be referred to a Nutritionist or for Diabetic Management?  No   Diabetic Exams:  Diabetic Eye Exam: Completed Yes. Overdue for diabetic eye exam. Pt has been advised about the importance in completing this exam. A referral has been placed today. Message sent to referral coordinator for scheduling purposes. Advised pt to expect a call from office referred to regarding appt.  Diabetic Foot Exam: Completed Yes. Pt has been advised about the importance in completing this exam. Pt is scheduled for diabetic foot exam on Followed by PCP.     Information entered by :: Rolene Arbour LPN Activities of Daily Living In your present state of health, do you have any difficulty performing the following activities: 03/30/2021  Hearing? N  Vision? N  Difficulty concentrating or making decisions? N  Walking or climbing stairs? Y  Comment Climbing stairs difficult due to being over weight  Dressing or bathing? N  Doing errands, shopping? N  Preparing Food and eating ? N  Using the Toilet? N  In the past six months, have you accidently leaked urine? N  Do you have problems with loss of bowel control? N  Managing your Medications? N  Managing your Finances? N  Housekeeping or managing your Housekeeping? N  Some recent data might be hidden    Patient Care Team: [place], Betty G, MD as PCP - General (Family Medicine) Minus Breeding, MD as PCP - Cardiology (Cardiology)  Indicate any recent Medical Services you may have received from other than [facility] providers in the past year (date may be approximate).     Assessment:   This is a routine wellness examination for [facility].  Hearing/Vision screen Hearing Screening - Comments:: No difficulty hearing Vision Screening - Comments:: Wears contacts. Followed by Dr Charma Igo  Dietary issues and exercise activities discussed: Exercise limited by: None identified   Goals Addressed               This Visit's Progress     Patient Stated (pt-stated)        Continue to learn how to play the drums.  Depression Screen PHQ 2/9 Scores 03/30/2021 03/28/2020  PHQ - 2 Score 4 3  PHQ- 9 Score 6 12    Fall Risk Fall Risk  03/30/2021 03/28/2020 11/24/2015  Falls in the past year? 0 0 Yes  Number falls in past yr: 0 0 2 or more  Injury with Fall? 0 0 -  Risk for fall due to : No Fall Risks No Fall Risks Impaired balance/gait  Risk for fall due to: Comment - - keep exercising  Follow up - Falls evaluation completed;Falls prevention discussed Education provided    FALL RISK  PREVENTION PERTAINING TO THE HOME:  Any stairs in or around the home? Yes  If so, are there any without handrails? No  Home free of loose throw rugs in walkways, pet beds, electrical cords, etc? Yes  Adequate lighting in your home to reduce risk of falls? Yes   ASSISTIVE DEVICES UTILIZED TO PREVENT FALLS:  Life alert? No  Use of a cane, walker or w/c? No  Grab bars in the bathroom? Yes  Shower chair or bench in shower? No  Elevated toilet seat or a handicapped toilet? No   TIMED UP AND GO:  Was the test performed? No .   Cognitive Function: MMSE - Mini Mental State Exam 11/24/2015  Not completed: (No Data)      Immunizations Immunization History  Administered Date(s) Administered   Influenza, High Dose Seasonal PF 01/25/2021   PFIZER(Purple Top)SARS-COV-2 Vaccination 04/30/2019, 05/28/2019, 11/30/2019   Tdap 03/18/2014   Unspecified SARS-COV-2 Vaccination 05/30/2019, 06/30/2019, 12/30/2019    TDAP status: Up to date  Flu Vaccine status: Up to date  Pneumococcal vaccine status: Declined,  Education has been provided regarding the importance of this vaccine but patient still declined. Advised may receive this vaccine at local pharmacy or Health Dept. Aware to provide a copy of the vaccination record if obtained from local pharmacy or Health Dept. Verbalized acceptance and understanding.   Covid-19 vaccine status: Completed vaccines  Qualifies for Shingles Vaccine? Yes   Zostavax completed No   Shingrix Completed?: No.    Education has been provided regarding the importance of this vaccine. Patient has been advised to call insurance company to determine out of pocket expense if they have not yet received this vaccine. Advised may also receive vaccine at local pharmacy or Health Dept. Verbalized acceptance and understanding.  Screening Tests Health Maintenance  Topic Date Due   FOOT EXAM  07/12/2018   COVID-19 Vaccine (6 - Booster) 02/24/2020   HEMOGLOBIN A1C   11/19/2020   OPHTHALMOLOGY EXAM  01/10/2021   PAP SMEAR-Modifier  03/30/2021 (Originally 10/26/2018)   MAMMOGRAM  05/08/2021 (Originally 11/20/2019)   Zoster Vaccines- Shingrix (1 of 2) 06/30/2021 (Originally [birth year]1988)   COLONOSCOPY (Pts 45-63yrs Insurance coverage will need to be confirmed)  03/31/2022 (Originally 11/20/2014)   TETANUS/TDAP  03/18/2024   INFLUENZA VACCINE  Completed   Hepatitis C Screening  Completed   HIV Screening  Completed   HPV VACCINES  Aged Out    Health Maintenance  Health Maintenance Due  Topic Date Due   FOOT EXAM  07/12/2018   COVID-19 Vaccine (6 - Booster) 02/24/2020   HEMOGLOBIN A1C  11/19/2020   OPHTHALMOLOGY EXAM  01/10/2021   Colonoscopy: Patient deferred Cologuard ordered 03/30/21  Mammogram status: Ordered 05/08/21. Pt provided with contact info and advised to call to schedule appt. Scheduled for above sate  Lung Cancer Screening: (Low Dose CT Chest recommended if Age 22-80 years, 30 pack-year currently smoking OR  have quit w/in 15years.) does not qualify.  Additional Screening:  Hepatitis C Screening: does not qualify; Completed 05/20/20  Vision Screening: Recommended annual ophthalmology exams for early detection of glaucoma and other disorders of the eye. Is the patient up to date with their annual eye exam?  Yes  Who is the provider or what is the name of the office in which the patient attends annual eye exams? Dr Charma Igo If pt is not established with a provider, would they like to be referred to a provider to establish care? No .   Dental Screening: Recommended annual dental exams for proper oral hygiene  Community Resource Referral / Chronic Care Management:  CRR required this visit?  No   CCM required this visit?  No      Plan:     I have personally reviewed and noted the following in the patients chart:   Medical and social history Use of alcohol, tobacco or illicit drugs  Current medications and supplements  including opioid prescriptions.  Functional ability and status Nutritional status Physical activity Advanced directives List of other physicians Hospitalizations, surgeries, and ER visits in previous 12 months Vitals Screenings to include cognitive, depression, and falls Referrals and appointments  In addition, I have reviewed and discussed with patient certain preventive protocols, quality metrics, and best practice recommendations. A written personalized care plan for preventive services as well as general preventive health recommendations were provided to patient.     Criselda Peaches, LPN   04/05/9022   Nurse Notes: None

## 2021-03-30 NOTE — Patient Instructions (Addendum)
[name] , Thank you for taking time to come for your Medicare Wellness Visit. I appreciate your ongoing commitment to your health goals. Please review the following plan we discussed and let me know if I can assist you in the future.   These are the goals we discussed:  Goals       Patient Stated (pt-stated)      Continue to learn how to play the drums.       Weight (lb) < 200 lb (90.7 kg)        This is a list of the screening recommended for you and due dates:  Health Maintenance  Topic Date Due   Complete foot exam   07/12/2018   COVID-19 Vaccine (6 - Booster) 02/24/2020   Hemoglobin A1C  11/19/2020   Eye exam for diabetics  01/10/2021   Pap Smear  03/30/2021*   Mammogram  05/08/2021*   Zoster (Shingles) Vaccine (1 of 2) 06/30/2021*   Colon Cancer Screening  03/31/2022*   Tetanus Vaccine  03/18/2024   Flu Shot  Completed   Hepatitis C Screening: USPSTF Recommendation to screen - Ages 18-79 yo.  Completed   HIV Screening  Completed   HPV Vaccine  Aged Out  *Topic was postponed. The date shown is not the original due date.   Advanced directives: Yes Patient will bring copy  Conditions/risks identified: None  Next appointment: Follow up in one year for your annual wellness visit.   Preventive Care 40-64 Years, Female Preventive care refers to lifestyle choices and visits with your health care provider that can promote health and wellness. What does preventive care include? A yearly physical exam. This is also called an annual well check. Dental exams once or twice a year. Routine eye exams. Ask your health care provider how often you should have your eyes checked. Personal lifestyle choices, including: Daily care of your teeth and gums. Regular physical activity. Eating a healthy diet. Avoiding tobacco and drug use. Limiting alcohol use. Practicing safe sex. Taking low-dose aspirin daily starting at age 23. Taking vitamin and mineral supplements as  recommended by your health care provider. What happens during an annual well check? The services and screenings done by your health care provider during your annual well check will depend on your age, overall health, lifestyle risk factors, and family history of disease. Counseling  Your health care provider may ask you questions about your: Alcohol use. Tobacco use. Drug use. Emotional well-being. Home and relationship well-being. Sexual activity. Eating habits. Work and work Statistician. Method of birth control. Menstrual cycle. Pregnancy history. Screening  You may have the following tests or measurements: Height, weight, and BMI. Blood pressure. Lipid and cholesterol levels. These may be checked every 5 years, or more frequently if you are over 65 years old. Skin check. Lung cancer screening. You may have this screening every year starting at age 65 if you have a 30-pack-year history of smoking and currently smoke or have quit within the past 15 years. Fecal occult blood test (FOBT) of the stool. You may have this test every year starting at age 20. Flexible sigmoidoscopy or colonoscopy. You may have a sigmoidoscopy every 5 years or a colonoscopy every 10 years starting at age 57. Hepatitis C blood test. Hepatitis B blood test. Sexually transmitted disease (STD) testing. Diabetes screening. This is done by checking your blood sugar (glucose) after you have not eaten for a while (fasting). You may have this done every 1-3 years. Mammogram. This  may be done every 1-2 years. Talk to your health care provider about when you should start having regular mammograms. This may depend on whether you have a family history of breast cancer. BRCA-related cancer screening. This may be done if you have a family history of breast, ovarian, tubal, or peritoneal cancers. Pelvic exam and Pap test. This may be done every 3 years starting at age 47. Starting at age 36, this may be done every 5 years if  you have a Pap test in combination with an HPV test. Bone density scan. This is done to screen for osteoporosis. You may have this scan if you are at high risk for osteoporosis. Discuss your test results, treatment options, and if necessary, the need for more tests with your health care provider. Vaccines  Your health care provider may recommend certain vaccines, such as: Influenza vaccine. This is recommended every year. Tetanus, diphtheria, and acellular pertussis (Tdap, Td) vaccine. You may need a Td booster every 10 years. Zoster vaccine. You may need this after age 51. Pneumococcal 13-valent conjugate (PCV13) vaccine. You may need this if you have certain conditions and were not previously vaccinated. Pneumococcal polysaccharide (PPSV23) vaccine. You may need one or two doses if you smoke cigarettes or if you have certain conditions. Talk to your health care provider about which screenings and vaccines you need and how often you need them. This information is not intended to replace advice given to you by your health care provider. Make sure you discuss any questions you have with your health care provider. Document Released: 02/11/2015 Document Revised: 10/05/2015 Document Reviewed: 11/16/2014 Elsevier Interactive Patient Education  2017 [place] Prevention in the Home Falls can cause injuries. They can happen to people of all ages. There are many things you can do to make your home safe and to help prevent falls. What can I do on the outside of my home? Regularly fix the edges of walkways and driveways and fix any cracks. Remove anything that might make you trip as you walk through a door, such as a raised step or threshold. Trim any bushes or trees on the path to your home. Use bright outdoor lighting. Clear any walking paths of anything that might make someone trip, such as rocks or tools. Regularly check to see if handrails are loose or broken. Make sure that both  sides of any steps have handrails. Any raised decks and porches should have guardrails on the edges. Have any leaves, snow, or ice cleared regularly. Use sand or salt on walking paths during winter. Clean up any spills in your garage right away. This includes oil or grease spills. What can I do in the bathroom? Use night lights. Install grab bars by the toilet and in the tub and shower. Do not use towel bars as grab bars. Use non-skid mats or decals in the tub or shower. If you need to sit down in the shower, use a plastic, non-slip stool. Keep the floor dry. Clean up any water that spills on the floor as soon as it happens. Remove soap buildup in the tub or shower regularly. Attach bath mats securely with double-sided non-slip rug tape. Do not have throw rugs and other things on the floor that can make you trip. What can I do in the bedroom? Use night lights. Make sure that you have a light by your bed that is easy to reach. Do not use any sheets or blankets that are too  big for your bed. They should not hang down onto the floor. Have a firm chair that has side arms. You can use this for support while you get dressed. Do not have throw rugs and other things on the floor that can make you trip. What can I do in the kitchen? Clean up any spills right away. Avoid walking on wet floors. Keep items that you use a lot in easy-to-reach places. If you need to reach something above you, use a strong step stool that has a grab bar. Keep electrical cords out of the way. Do not use floor polish or wax that makes floors slippery. If you must use wax, use non-skid floor wax. Do not have throw rugs and other things on the floor that can make you trip. What can I do with my stairs? Do not leave any items on the stairs. Make sure that there are handrails on both sides of the stairs and use them. Fix handrails that are broken or loose. Make sure that handrails are as long as the stairways. Check any  carpeting to make sure that it is firmly attached to the stairs. Fix any carpet that is loose or worn. Avoid having throw rugs at the top or bottom of the stairs. If you do have throw rugs, attach them to the floor with carpet tape. Make sure that you have a light switch at the top of the stairs and the bottom of the stairs. If you do not have them, ask someone to add them for you. What else can I do to help prevent falls? Wear shoes that: Do not have high heels. Have rubber bottoms. Are comfortable and fit you well. Are closed at the toe. Do not wear sandals. If you use a stepladder: Make sure that it is fully opened. Do not climb a closed stepladder. Make sure that both sides of the stepladder are locked into place. Ask someone to hold it for you, if possible. Clearly mark and make sure that you can see: Any grab bars or handrails. First and last steps. Where the edge of each step is. Use tools that help you move around (mobility aids) if they are needed. These include: Canes. Walkers. Scooters. Crutches. Turn on the lights when you go into a dark area. Replace any light bulbs as soon as they burn out. Set up your furniture so you have a clear path. Avoid moving your furniture around. If any of your floors are uneven, fix them. If there are any pets around you, be aware of where they are. Review your medicines with your doctor. Some medicines can make you feel dizzy. This can increase your chance of falling. Ask your doctor what other things that you can do to help prevent falls. This information is not intended to replace advice given to you by your health care provider. Make sure you discuss any questions you have with your health care provider. Document Released: 11/11/2008 Document Revised: 06/23/2015 Document Reviewed: 02/19/2014 Elsevier Interactive Patient Education  2017 [employer].

## 2021-04-04 ENCOUNTER — Ambulatory Visit (INDEPENDENT_AMBULATORY_CARE_PROVIDER_SITE_OTHER): Payer: Medicare Other | Admitting: Psychology

## 2021-04-04 DIAGNOSIS — F319 Bipolar disorder, unspecified: Secondary | ICD-10-CM | POA: Diagnosis not present

## 2021-04-04 NOTE — Progress Notes (Signed)
?[name] is a 51 y.o. female patient  ? ?Treatment Plan: ?Date: 04/04/2021  ?Diagnosis ?296.80 (Unspecified bipolar or related disorder) [n/a]  ?Symptoms ?Depressed or irritable mood. (Status: maintained) -- No Description Entered  ?Diminished interest in or enjoyment of activities. (Status: maintained) -- No Description Entered  ?Lack of energy. (Status: maintained) -- No Description Entered  ?Low self-esteem. (Status: maintained) -- No Description Entered  ?Medication Status ?compliance  ?Safety ?none  ?If Suicidal or Homicidal State Action Taken: unspecified  ?Current Risk: low ?Medications ?Adderall (Dosage: '20mg'$  4X/day)  ?Klonapin (Dosage: '1mg'$  3X/day)  ?Lamictal (Dosage: '200mg'$  2x/day)  ?Nuedexta (Dosage: '20mg'$ )  ?Vyralar (Dosage: '6mg'$ )  ?Xanax (Dosage: '1mg'$  3X/day)  ?Objectives ?Related Problem: Develop healthy cognitive patterns and beliefs about self and the world that lead to alleviation and help prevent the relapse of mood episodes. ?Description: State no longer having thoughts of self-harm. ?Target Date: 2022-01-07 ?Frequency: Daily ?Modality: individual ?Progress: 80% ? ?Related Problem: Develop healthy cognitive patterns and beliefs about self and the world that lead to alleviation and help prevent the relapse of mood episodes. ?Description: Verbalize any history of past and present suicidal thoughts and actions. ?Target Date: 2022-01-07 ?Frequency: Daily ?Modality: individual ?Progress: 80% ? ?Related Problem: Develop healthy cognitive patterns and beliefs about self and the world that lead to alleviation and help prevent the relapse of mood episodes. ?Description: Take prescribed medications as directed. ?Target Date: 2022-01-07 ?Frequency: Daily ?Modality: individual ?Progress: 90% ? ?Related Problem: Develop healthy cognitive patterns and beliefs about self and the world that lead to alleviation and help prevent the relapse of mood episodes. ?Description: Discuss and resolve troubling  personal and interpersonal issues. ?Target Date: 2022-01-07 ?Frequency: Daily ?Modality: individual ?Progress: 80% ? ?Client Response ?full compliance  ?Service Location ?Location, [name] [place], [place] [address]  ?Service Code ?cpt [id number]  ?Emotion regulation skills  ?Related past to present  ?Rationally challenge thoughts or beliefs/cognitive restructuring  ?Identify/label emotions  ?Validate/empathize  ?Self care activities  ?Lifestyle change (exercise, nutrition)  ?Self-monitoring  ?Facilitate problem solving  ?Session notes:  ?f31.9 Bipolar Unspecified.  ?Goals: Mood stabilization, improved marital relationship, improve self esteem and elimination of self harm thoughts/tendencies. Also, patient struggles with leaving home comfortably and seeks to improve this ability. Target date for these goals is January, 2023. In addition, she needs to improve self care behaviors due to her weight and various medical conditions. Target date for this goal is Feb., 2022. Date revised to December, 2023 to allow time for behavioral implementation. Reports periodic outbursts of rage/anger. Seeks to minimize. Target date is December, 2023. Patient has realized improvement in all areas. Requests additional treatment to facilitate maintenance of progress. Goal date 12-23  ?Meds: Vyralar ('6mg'$ ), Lamictal ('100mg'$  2X/day), Nuedexta (40+'20mg'$ /day), Adderall ('20mg'$  4X/day), Xanax ('1mg'$ , 3X/day), and Klonopin ('1mg'$  3X/day).  ?Patient has requested a video Webex session due to the corona virus. She is at home and I am in my home office. ? ?[name] says she has "little to talk about today". Dr. Toy Care decreased her Klonopin and she says she has had some "silent panic attacks". That is, she says "I'm screaming a nd suffering on the inside", but not showing that distress. She has not shared this experience with Dr. Toy Care. Madelynn talked about getting frustrated with her mother when her mother demonstrates episodes of cognitive  deterioration. She struggles with reminders that her mother is aging. She and sibs continue to avoid discussion and decision planning.                           ?  ?        ? ?  [name], PhD  Time:2:15-3:00 45 minutes. ? ? ? ? ? ? ? ? ? ? ? ? ? ? ? ? ? ? ? ? ? ? ?

## 2021-04-11 ENCOUNTER — Ambulatory Visit (INDEPENDENT_AMBULATORY_CARE_PROVIDER_SITE_OTHER): Payer: Medicare Other | Admitting: Psychology

## 2021-04-11 DIAGNOSIS — F319 Bipolar disorder, unspecified: Secondary | ICD-10-CM | POA: Diagnosis not present

## 2021-04-11 NOTE — Progress Notes (Signed)
[name] is a 51 y.o. female patient  ? ?Treatment Plan: ?Date: 04/11/2021  ?Diagnosis ?296.80 (Unspecified bipolar or related disorder) [n/a]  ?Symptoms ?Depressed or irritable mood. (Status: maintained) -- No Description Entered  ?Diminished interest in or enjoyment of activities. (Status: maintained) -- No Description Entered  ?Lack of energy. (Status: maintained) -- No Description Entered  ?Low self-esteem. (Status: maintained) -- No Description Entered  ?Medication Status ?compliance  ?Safety ?none  ?If Suicidal or Homicidal State Action Taken: unspecified  ?Current Risk: low ?Medications ?Adderall (Dosage: '20mg'$  4X/day)  ?Klonapin (Dosage: '1mg'$  3X/day)  ?Lamictal (Dosage: '200mg'$  2x/day)  ?Nuedexta (Dosage: '20mg'$ )  ?Vyralar (Dosage: '6mg'$ )  ?Xanax (Dosage: '1mg'$  3X/day)  ?Objectives ?Related Problem: Develop healthy cognitive patterns and beliefs about self and the world that lead to alleviation and help prevent the relapse of mood episodes. ?Description: State no longer having thoughts of self-harm. ?Target Date: 2022-01-07 ?Frequency: Daily ?Modality: individual ?Progress: 80% ? ?Related Problem: Develop healthy cognitive patterns and beliefs about self and the world that lead to alleviation and help prevent the relapse of mood episodes. ?Description: Verbalize any history of past and present suicidal thoughts and actions. ?Target Date: 2022-01-07 ?Frequency: Daily ?Modality: individual ?Progress: 80% ? ?Related Problem: Develop healthy cognitive patterns and beliefs about self and the world that lead to alleviation and help prevent the relapse of mood episodes. ?Description: Take prescribed medications as directed. ?Target Date: 2022-01-07 ?Frequency: Daily ?Modality: individual ?Progress: 90% ? ?Related Problem: Develop healthy cognitive patterns and beliefs about self and the world that lead to alleviation and help prevent the relapse of mood episodes. ?Description: Discuss and resolve troubling personal  and interpersonal issues. ?Target Date: 2022-01-07 ?Frequency: Daily ?Modality: individual ?Progress: 80% ? ?Client Response ?full compliance  ?Service Location ?Location, [name] [place], [place] [address]  ?Service Code ?cpt T5181803  ?Emotion regulation skills  ?Related past to present  ?Rationally challenge thoughts or beliefs/cognitive restructuring  ?Identify/label emotions  ?Validate/empathize  ?Self care activities  ?Lifestyle change (exercise, nutrition)  ?Self-monitoring  ?Facilitate problem solving  ?Session notes:  ?f31.9 Bipolar Unspecified.  ?Goals: Mood stabilization, improved marital relationship, improve self esteem and elimination of self harm thoughts/tendencies. Also, patient struggles with leaving home comfortably and seeks to improve this ability. Target date for these goals is January, 2023. In addition, she needs to improve self care behaviors due to her weight and various medical conditions. Target date for this goal is Feb., 2022. Date revised to December, 2023 to allow time for behavioral implementation. Reports periodic outbursts of rage/anger. Seeks to minimize. Target date is December, 2023. Patient has realized improvement in all areas. Requests additional treatment to facilitate maintenance of progress. Goal date 12-23  ?Meds: Vyralar ('6mg'$ ), Lamictal ('100mg'$  2X/day), Nuedexta (40+'20mg'$ /day), Adderall ('20mg'$  4X/day), Xanax ('1mg'$ , 3X/day), and Klonopin ('1mg'$  3X/day).  ?Patient has requested a video Webex session due to the corona virus. She is at home and I am in my home office. ? ?[name] says that her insurance has been messed up since [employer] stopped work. He is getting it all figured out and it is consuming his time. He still is not seeking employment. [name] is moderately concerned, but trying to remain calm and trust [name].  Her father is back on anti-biotics for respiratory issues. He has said he does not plan to go on them again unless absolutely necessary. She is worried about  him.  ?[name] has had some muscle pain and had to take a muscle relaxer. She is concerned she  will not be able to get the subscription refilled. Will contact doctor to inquire. Took her 9 months to go through 3 month supply.  ?  ?                              ?  ?        ?[name], PhD  Time:2:15p-3:00 45 minutes. ? ? ? ? ? ? ? ? ? ? ? ? ? ? ? ? ? ? ? ? ? ? ?

## 2021-04-12 ENCOUNTER — Telehealth: Payer: Self-pay | Admitting: Cardiology

## 2021-04-12 LAB — COLOGUARD

## 2021-04-12 NOTE — Telephone Encounter (Signed)
?*  STAT* If patient is at the pharmacy, call can be transferred to refill team. ? ? ?1. Which medications need to be refilled? (please list name of each medication and dose if known)  ?potassium chloride SA (KLOR-CON M20) 20 MEQ tablet ? ?2. Which pharmacy/location (including street and city if local pharmacy) is medication to be sent to? [facility], [place] [place] AT [place] ? ?3. Do they need a 30 day or 90 day supply? 90 day  ?

## 2021-04-14 ENCOUNTER — Other Ambulatory Visit: Payer: Self-pay

## 2021-04-14 MED ORDER — POTASSIUM CHLORIDE CRYS ER 20 MEQ PO TBCR: 20.0000 meq | EXTENDED_RELEASE_TABLET | Freq: Every day | ORAL | 4 refills | Status: AC

## 2021-04-14 NOTE — Telephone Encounter (Signed)
Spoke to patient for clarity of which pharmacy to send refill. Refill for Potassium Chloride SA (Klor-Con M 20) sent to [facility], 90 day supply with refills. ?

## 2021-04-14 NOTE — Telephone Encounter (Signed)
I called the patient and left a message in reference to refill for Potassium Chloride SA (Klor-Con M20) 20 MEQ. ?

## 2021-04-18 ENCOUNTER — Ambulatory Visit (INDEPENDENT_AMBULATORY_CARE_PROVIDER_SITE_OTHER): Payer: Medicare Other | Admitting: Psychology

## 2021-04-18 DIAGNOSIS — F319 Bipolar disorder, unspecified: Secondary | ICD-10-CM

## 2021-04-18 NOTE — Progress Notes (Signed)
? ? ? ? ? ? ?[name] is a 51 y.o. female patient  ? ?Treatment Plan: ?Date: 04/18/2021  ?Diagnosis ?296.80 (Unspecified bipolar or related disorder) [n/a]  ?Symptoms ?Depressed or irritable mood. (Status: maintained) -- No Description Entered  ?Diminished interest in or enjoyment of activities. (Status: maintained) -- No Description Entered  ?Lack of energy. (Status: maintained) -- No Description Entered  ?Low self-esteem. (Status: maintained) -- No Description Entered  ?Medication Status ?compliance  ?Safety ?none  ?If Suicidal or Homicidal State Action Taken: unspecified  ?Current Risk: low ?Medications ?Adderall (Dosage: '20mg'$  4X/day)  ?Klonapin (Dosage: '1mg'$  3X/day)  ?Lamictal (Dosage: '200mg'$  2x/day)  ?Nuedexta (Dosage: '20mg'$ )  ?Vyralar (Dosage: '6mg'$ )  ?Xanax (Dosage: '1mg'$  3X/day)  ?Objectives ?Related Problem: Develop healthy cognitive patterns and beliefs about self and the world that lead to alleviation and help prevent the relapse of mood episodes. ?Description: State no longer having thoughts of self-harm. ?Target Date: 2022-01-07 ?Frequency: Daily ?Modality: individual ?Progress: 80% ? ?Related Problem: Develop healthy cognitive patterns and beliefs about self and the world that lead to alleviation and help prevent the relapse of mood episodes. ?Description: Verbalize any history of past and present suicidal thoughts and actions. ?Target Date: 2022-01-07 ?Frequency: Daily ?Modality: individual ?Progress: 80% ? ?Related Problem: Develop healthy cognitive patterns and beliefs about self and the world that lead to alleviation and help prevent the relapse of mood episodes. ?Description: Take prescribed medications as directed. ?Target Date: 2022-01-07 ?Frequency: Daily ?Modality: individual ?Progress: 90% ? ?Related Problem: Develop healthy cognitive patterns and beliefs about self and the world that lead to alleviation and help prevent the relapse of mood episodes. ?Description: Discuss and resolve  troubling personal and interpersonal issues. ?Target Date: 2022-01-07 ?Frequency: Daily ?Modality: individual ?Progress: 80% ? ?Client Response ?full compliance  ?Service Location ?Location, [name] [place], [place] [address]  ?Service Code ?cpt [id number]  ?Emotion regulation skills  ?Related past to present  ?Rationally challenge thoughts or beliefs/cognitive restructuring  ?Identify/label emotions  ?Validate/empathize  ?Self care activities  ?Lifestyle change (exercise, nutrition)  ?Self-monitoring  ?Facilitate problem solving  ?Session notes:  ?f31.9 Bipolar Unspecified.  ?Goals: Mood stabilization, improved marital relationship, improve self esteem and elimination of self harm thoughts/tendencies. Also, patient struggles with leaving home comfortably and seeks to improve this ability. Target date for these goals is January, 2023. In addition, she needs to improve self care behaviors due to her weight and various medical conditions. Target date for this goal is Feb., 2022. Date revised to December, 2023 to allow time for behavioral implementation. Reports periodic outbursts of rage/anger. Seeks to minimize. Target date is December, 2023. Patient has realized improvement in all areas. Requests additional treatment to facilitate maintenance of progress. Goal date 12-23  ?Meds: Vyralar ('6mg'$ ), Lamictal ('100mg'$  2X/day), Nuedexta (40+'20mg'$ /day), Adderall ('20mg'$  4X/day), Xanax ('1mg'$ , 3X/day), and Klonopin ('1mg'$  3X/day).  ?Patient has requested a video Webex session due to the corona virus. She is at home and I am in my home office. ? ?Prudie says [name] hasn't had time to look for a job since he is having to deal with their insurance and other issues since leaving his employer. We discussed her mother's condition and Shemaiah's concern about the progression of her cognitive deterioration. Told her that it is still important that they plan ahead for her care and she should enlist the help of her family.    ?  ?                               ?  ?        ?  [name], PhD  Time:2:15p-3:00 45 minutes. ? ? ? ? ? ? ? ? ? ? ? ? ? ? ? ? ? ? ? ? ? ? ?

## 2021-04-25 ENCOUNTER — Other Ambulatory Visit: Payer: Self-pay | Admitting: Cardiology

## 2021-04-25 ENCOUNTER — Ambulatory Visit (INDEPENDENT_AMBULATORY_CARE_PROVIDER_SITE_OTHER): Payer: Medicare Other | Admitting: Psychology

## 2021-04-25 ENCOUNTER — Other Ambulatory Visit: Payer: Self-pay | Admitting: Family Medicine

## 2021-04-25 DIAGNOSIS — F319 Bipolar disorder, unspecified: Secondary | ICD-10-CM | POA: Diagnosis not present
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
## 2021-05-02 ENCOUNTER — Ambulatory Visit (INDEPENDENT_AMBULATORY_CARE_PROVIDER_SITE_OTHER): Payer: Medicare Other | Admitting: Psychology

## 2021-05-02 DIAGNOSIS — F319 Bipolar disorder, unspecified: Secondary | ICD-10-CM | POA: Diagnosis not present

## 2021-05-02 NOTE — Progress Notes (Signed)
? ? ? ? ?[name] is a 51 y.o. female patient  ? ?Treatment Plan: ?Date: 05/02/2021  ?Diagnosis ?296.80 (Unspecified bipolar or related disorder) [n/a]  ?Symptoms ?Depressed or irritable mood. (Status: maintained) -- No Description Entered  ?Diminished interest in or enjoyment of activities. (Status: maintained) -- No Description Entered  ?Lack of energy. (Status: maintained) -- No Description Entered  ?Low self-esteem. (Status: maintained) -- No Description Entered  ?Medication Status ?compliance  ?Safety ?none  ?If Suicidal or Homicidal State Action Taken: unspecified  ?Current Risk: low ?Medications ?Adderall (Dosage: '20mg'$  4X/day)  ?Klonapin (Dosage: '1mg'$  3X/day)  ?Lamictal (Dosage: '200mg'$  2x/day)  ?Nuedexta (Dosage: '20mg'$ )  ?Vyralar (Dosage: '6mg'$ )  ?Xanax (Dosage: '1mg'$  3X/day)  ?Objectives ?Related Problem: Develop healthy cognitive patterns and beliefs about self and the world that lead to alleviation and help prevent the relapse of mood episodes. ?Description: State no longer having thoughts of self-harm. ?Target Date: 2022-01-07 ?Frequency: Daily ?Modality: individual ?Progress: 80% ? ?Related Problem: Develop healthy cognitive patterns and beliefs about self and the world that lead to alleviation and help prevent the relapse of mood episodes. ?Description: Verbalize any history of past and present suicidal thoughts and actions. ?Target Date: 2022-01-07 ?Frequency: Daily ?Modality: individual ?Progress: 80% ? ?Related Problem: Develop healthy cognitive patterns and beliefs about self and the world that lead to alleviation and help prevent the relapse of mood episodes. ?Description: Take prescribed medications as directed. ?Target Date: 2022-01-07 ?Frequency: Daily ?Modality: individual ?Progress: 90% ? ?Related Problem: Develop healthy cognitive patterns and beliefs about self and the world that lead to alleviation and help prevent the relapse of mood episodes. ?Description: Discuss and resolve troubling  personal and interpersonal issues. ?Target Date: 2022-01-07 ?Frequency: Daily ?Modality: individual ?Progress: 80% ? ?Client Response ?full compliance  ?Service Location ?Location, [name] [place], [place] [address] -virtual ?Service Code ?cpt [id number]  ?Emotion regulation skills  ?Related past to present  ?Rationally challenge thoughts or beliefs/cognitive restructuring  ?Identify/label emotions  ?Validate/empathize  ?Self care activities  ?Lifestyle change (exercise, nutrition)  ?Self-monitoring  ?Facilitate problem solving  ?Session notes:  ?f31.9 Bipolar Unspecified.  ?Goals: Mood stabilization, improved marital relationship, improve self esteem and elimination of self harm thoughts/tendencies. Also, patient struggles with leaving home comfortably and seeks to improve this ability. Target date for these goals is January, 2023. In addition, she needs to improve self care behaviors due to her weight and various medical conditions. Target date for this goal is Feb., 2022. Date revised to December, 2023 to allow time for behavioral implementation. Reports periodic outbursts of rage/anger. Seeks to minimize. Target date is December, 2023. Patient has realized improvement in all areas. Requests additional treatment to facilitate maintenance of progress. Goal date 12-23  ?Meds: Vyralar ('6mg'$ ), Lamictal ('100mg'$  2X/day), Nuedexta (40+'20mg'$ /day), Adderall ('20mg'$  4X/day), Xanax ('1mg'$ , 3X/day), and Klonopin ('1mg'$  3X/day).  ?Patient has requested a video Webex session due to the corona virus. She is at home and I am in my home office. ? ?[name] had her 29th anniversary. She and [name] went out to dinner together.She talked about her insurance plan now that [name] has left his job. She is concerned about the cost of her medication (one of which is $4,000). Her moods are even and she is compliant with treatment. We talked about her need to start taking better care of her physical self.           ?  ?                               ?  ?        ?  [name], PhD  Time:2:15p-3:00 45 minutes. ? ? ? ? ? ? ? ? ? ? ? ? ? ? ? ? ? ? ? ? ? ? ?

## 2021-05-09 ENCOUNTER — Ambulatory Visit (INDEPENDENT_AMBULATORY_CARE_PROVIDER_SITE_OTHER): Payer: Medicare Other | Admitting: Psychology

## 2021-05-09 DIAGNOSIS — F319 Bipolar disorder, unspecified: Secondary | ICD-10-CM | POA: Diagnosis not present

## 2021-05-09 NOTE — Progress Notes (Signed)
? ? ? ? ? ? ? ? ? ? ? ? ? ? ? ? ? ? ? ?[name] is a 51 y.o. female patient  ? ?Treatment Plan: ?Date: 05/09/2021  ?Diagnosis ?296.80 (Unspecified bipolar or related disorder) [n/a]  ?Symptoms ?Depressed or irritable mood. (Status: maintained) -- No Description Entered  ?Diminished interest in or enjoyment of activities. (Status: maintained) -- No Description Entered  ?Lack of energy. (Status: maintained) -- No Description Entered  ?Low self-esteem. (Status: maintained) -- No Description Entered  ?Medication Status ?compliance  ?Safety ?none  ?If Suicidal or Homicidal State Action Taken: unspecified  ?Current Risk: low ?Medications ?Adderall (Dosage: '20mg'$  4X/day)  ?Klonapin (Dosage: '1mg'$  3X/day)  ?Lamictal (Dosage: '200mg'$  2x/day)  ?Nuedexta (Dosage: '20mg'$ )  ?Vyralar (Dosage: '6mg'$ )  ?Xanax (Dosage: '1mg'$  3X/day)  ?Objectives ?Related Problem: Develop healthy cognitive patterns and beliefs about self and the world that lead to alleviation and help prevent the relapse of mood episodes. ?Description: State no longer having thoughts of self-harm. ?Target Date: 2022-01-07 ?Frequency: Daily ?Modality: individual ?Progress: 80% ? ?Related Problem: Develop healthy cognitive patterns and beliefs about self and the world that lead to alleviation and help prevent the relapse of mood episodes. ?Description: Verbalize any history of past and present suicidal thoughts and actions. ?Target Date: 2022-01-07 ?Frequency: Daily ?Modality: individual ?Progress: 80% ? ?Related Problem: Develop healthy cognitive patterns and beliefs about self and the world that lead to alleviation and help prevent the relapse of mood episodes. ?Description: Take prescribed medications as directed. ?Target Date: 2022-01-07 ?Frequency: Daily ?Modality: individual ?Progress: 90% ? ?Related Problem: Develop healthy cognitive patterns and beliefs about self and the world that lead to alleviation and help prevent the relapse of mood  episodes. ?Description: Discuss and resolve troubling personal and interpersonal issues. ?Target Date: 2022-01-07 ?Frequency: Daily ?Modality: individual ?Progress: 80% ? ?Client Response ?full compliance  ?Service Location ?Location, [name] [place], [place] [address] -virtual ?Service Code ?cpt [id number]  ?Emotion regulation skills  ?Related past to present  ?Rationally challenge thoughts or beliefs/cognitive restructuring  ?Identify/label emotions  ?Validate/empathize  ?Self care activities  ?Lifestyle change (exercise, nutrition)  ?Self-monitoring  ?Facilitate problem solving  ?Session notes:  ?f31.9 Bipolar Unspecified.  ?Goals: Mood stabilization, improved marital relationship, improve self esteem and elimination of self harm thoughts/tendencies. Also, patient struggles with leaving home comfortably and seeks to improve this ability. Target date for these goals is January, 2023. In addition, she needs to improve self care behaviors due to her weight and various medical conditions. Target date for this goal is Feb., 2022. Date revised to December, 2023 to allow time for behavioral implementation. Reports periodic outbursts of rage/anger. Seeks to minimize. Target date is December, 2023. Patient has realized improvement in all areas. Requests additional treatment to facilitate maintenance of progress. Goal date 12-23  ?Meds: Vyralar ('6mg'$ ), Lamictal ('100mg'$  2X/day), Nuedexta (40+'20mg'$ /day), Adderall ('20mg'$  4X/day), Xanax ('1mg'$ , 3X/day), and Klonopin ('1mg'$  3X/day).  ?Patient has requested a video Webex session due to the corona virus. She is at home and I am in my home office. ? ?[name] had her yearly physical yesterday. Test results look normal, except still waiting on mammogram results.They made plans to go celebrate [name]'s retirement from [employer] with friends at [employer]. ?Looks like [name] will be working at [employer] with [place] brother. ?[name] says her mother apologized to her for "keeping her from her  biological family". Her mom says she intentionally stayed away from [place] family. [name] told her she forgives her and does  not harbor resentment. In reality, she does feel her mother "messed up", but does not want to upset her mother. [name] said her mother was always negative about [name]'s family and it made her feel bad. We need to discuss this further. It was difficult for [name] to reassure her mother.                   ?  ?                              ?  ?        ?Jessica Morel, PhD  Time:2:15p-3:00 45 minutes. ? ? ? ? ? ? ? ? ? ? ? ? ? ? ? ? ? ? ? ? ? ? ?

## 2021-05-16 ENCOUNTER — Ambulatory Visit (INDEPENDENT_AMBULATORY_CARE_PROVIDER_SITE_OTHER): Payer: Medicare Other | Admitting: Psychology

## 2021-05-16 ENCOUNTER — Other Ambulatory Visit: Payer: Self-pay | Admitting: Obstetrics and Gynecology

## 2021-05-16 DIAGNOSIS — F319 Bipolar disorder, unspecified: Secondary | ICD-10-CM | POA: Diagnosis not present

## 2021-05-16 DIAGNOSIS — R928 Other abnormal and inconclusive findings on diagnostic imaging of breast: Secondary | ICD-10-CM

## 2021-05-16 NOTE — Progress Notes (Signed)
? ? ? ? ? ? ? ? ? ? ? ? ? ? ? ? ? ? ? ? ? ? ? ? ? ? ? ? ? ? ? ? ? ? ?[name] is a 51 y.o. female patient  ? ?Treatment Plan: ?Date: 05/16/2021  ?Diagnosis ?296.80 (Unspecified bipolar or related disorder) [n/a]  ?Symptoms ?Depressed or irritable mood. (Status: maintained) -- No Description Entered  ?Diminished interest in or enjoyment of activities. (Status: maintained) -- No Description Entered  ?Lack of energy. (Status: maintained) -- No Description Entered  ?Low self-esteem. (Status: maintained) -- No Description Entered  ?Medication Status ?compliance  ?Safety ?none  ?If Suicidal or Homicidal State Action Taken: unspecified  ?Current Risk: low ?Medications ?Adderall (Dosage: '20mg'$  4X/day)  ?Klonapin (Dosage: '1mg'$  3X/day)  ?Lamictal (Dosage: '200mg'$  2x/day)  ?Nuedexta (Dosage: '20mg'$ )  ?Vyralar (Dosage: '6mg'$ )  ?Xanax (Dosage: '1mg'$  3X/day)  ?Objectives ?Related Problem: Develop healthy cognitive patterns and beliefs about self and the world that lead to alleviation and help prevent the relapse of mood episodes. ?Description: State no longer having thoughts of self-harm. ?Target Date: 2022-01-07 ?Frequency: Daily ?Modality: individual ?Progress: 80% ? ?Related Problem: Develop healthy cognitive patterns and beliefs about self and the world that lead to alleviation and help prevent the relapse of mood episodes. ?Description: Verbalize any history of past and present suicidal thoughts and actions. ?Target Date: 2022-01-07 ?Frequency: Daily ?Modality: individual ?Progress: 80% ? ?Related Problem: Develop healthy cognitive patterns and beliefs about self and the world that lead to alleviation and help prevent the relapse of mood episodes. ?Description: Take prescribed medications as directed. ?Target Date: 2022-01-07 ?Frequency: Daily ?Modality: individual ?Progress: 90% ? ?Related Problem: Develop healthy cognitive patterns and beliefs about self and the world that lead to alleviation and help prevent the relapse  of mood episodes. ?Description: Discuss and resolve troubling personal and interpersonal issues. ?Target Date: 2022-01-07 ?Frequency: Daily ?Modality: individual ?Progress: 80% ? ?Client Response ?full compliance  ?Service Location ?Location, [name] [place], [place] [address] -virtual ?Service Code ?cpt [id number]  ?Emotion regulation skills  ?Related past to present  ?Rationally challenge thoughts or beliefs/cognitive restructuring  ?Identify/label emotions  ?Validate/empathize  ?Self care activities  ?Lifestyle change (exercise, nutrition)  ?Self-monitoring  ?Facilitate problem solving  ?Session notes:  ?f31.9 Bipolar Unspecified.  ?Goals: Mood stabilization, improved marital relationship, improve self esteem and elimination of self harm thoughts/tendencies. Also, patient struggles with leaving home comfortably and seeks to improve this ability. Target date for these goals is January, 2023. In addition, she needs to improve self care behaviors due to her weight and various medical conditions. Target date for this goal is Feb., 2022. Date revised to December, 2023 to allow time for behavioral implementation. Reports periodic outbursts of rage/anger. Seeks to minimize. Target date is December, 2023. Patient has realized improvement in all areas. Requests additional treatment to facilitate maintenance of progress. Goal date 12-23  ?Meds: Vyralar ('6mg'$ ), Lamictal ('100mg'$  2X/day), Nuedexta (40+'20mg'$ /day), Adderall ('20mg'$  4X/day), Xanax ('1mg'$ , 3X/day), and Klonopin ('1mg'$  3X/day).  ?Patient has requested a video Webex session due to the corona virus. She is at home and I am in my home office. ? ?[name] is very nervous because her mammogram indicated a mass that they want to re-scan. She still has not heard back from them with a date for the scan. She was initially called on Thursday and is frustrated that they have not gotten back to her. States that her [name], age 61, is having problems with dementia. [name]  says  she is scared that she may suffer the same fate, given her blood relatives are having strokes and dementia problems. That, coupled with the fears of breast cancer, makes [name] feel especially anxious. We talked about the ways to keep perspective and manage her anxiety. She feels she will be able to maintain control of her emotions                      ?  ?                              ?  ?        ?[name], PhD  Time:2:15p-3:00 45 minutes. ? ? ? ? ? ? ? ? ? ? ? ? ? ? ? ? ? ? ? ? ? ? ?

## 2021-05-21 ENCOUNTER — Other Ambulatory Visit: Payer: Self-pay | Admitting: Family Medicine

## 2021-05-23 ENCOUNTER — Ambulatory Visit (INDEPENDENT_AMBULATORY_CARE_PROVIDER_SITE_OTHER): Payer: Medicare Other | Admitting: Psychology

## 2021-05-23 DIAGNOSIS — F319 Bipolar disorder, unspecified: Secondary | ICD-10-CM | POA: Diagnosis not present

## 2021-05-23 NOTE — Progress Notes (Signed)
? ? ? ? ? ? ? ? ? ?[name] is a 51 y.o. female patient  ? ?Treatment Plan: ?Date: 05/23/2021  ?Diagnosis ?296.80 (Unspecified bipolar or related disorder) [n/a]  ?Symptoms ?Depressed or irritable mood. (Status: maintained) -- No Description Entered  ?Diminished interest in or enjoyment of activities. (Status: maintained) -- No Description Entered  ?Lack of energy. (Status: maintained) -- No Description Entered  ?Low self-esteem. (Status: maintained) -- No Description Entered  ?Medication Status ?compliance  ?Safety ?none  ?If Suicidal or Homicidal State Action Taken: unspecified  ?Current Risk: low ?Medications ?Adderall (Dosage: '20mg'$  4X/day)  ?Klonapin (Dosage: '1mg'$  3X/day)  ?Lamictal (Dosage: '200mg'$  2x/day)  ?Nuedexta (Dosage: '20mg'$ )  ?Vyralar (Dosage: '6mg'$ )  ?Xanax (Dosage: '1mg'$  3X/day)  ?Objectives ?Related Problem: Develop healthy cognitive patterns and beliefs about self and the world that lead to alleviation and help prevent the relapse of mood episodes. ?Description: State no longer having thoughts of self-harm. ?Target Date: 2022-01-07 ?Frequency: Daily ?Modality: individual ?Progress: 80% ? ?Related Problem: Develop healthy cognitive patterns and beliefs about self and the world that lead to alleviation and help prevent the relapse of mood episodes. ?Description: Verbalize any history of past and present suicidal thoughts and actions. ?Target Date: 2022-01-07 ?Frequency: Daily ?Modality: individual ?Progress: 80% ? ?Related Problem: Develop healthy cognitive patterns and beliefs about self and the world that lead to alleviation and help prevent the relapse of mood episodes. ?Description: Take prescribed medications as directed. ?Target Date: 2022-01-07 ?Frequency: Daily ?Modality: individual ?Progress: 90% ? ?Related Problem: Develop healthy cognitive patterns and beliefs about self and the world that lead to alleviation and help prevent the relapse of mood episodes. ?Description: Discuss and  resolve troubling personal and interpersonal issues. ?Target Date: 2022-01-07 ?Frequency: Daily ?Modality: individual ?Progress: 80% ? ?Client Response ?full compliance  ?Service Location ?Location, [name] [place], [place] [address] -virtual ?Service Code ?cpt [id number]  ?Emotion regulation skills  ?Related past to present  ?Rationally challenge thoughts or beliefs/cognitive restructuring  ?Identify/label emotions  ?Validate/empathize  ?Self care activities  ?Lifestyle change (exercise, nutrition)  ?Self-monitoring  ?Facilitate problem solving  ?Session notes:  ?f31.9 Bipolar Unspecified.  ?Goals: Mood stabilization, improved marital relationship, improve self esteem and elimination of self harm thoughts/tendencies. Also, patient struggles with leaving home comfortably and seeks to improve this ability. Target date for these goals is January, 2023. In addition, she needs to improve self care behaviors due to her weight and various medical conditions. Target date for this goal is Feb., 2022. Date revised to December, 2023 to allow time for behavioral implementation. Reports periodic outbursts of rage/anger. Seeks to minimize. Target date is December, 2023. Patient has realized improvement in all areas. Requests additional treatment to facilitate maintenance of progress. Goal date 12-23  ?Meds: Vyralar ('6mg'$ ), Lamictal ('100mg'$  2X/day), Nuedexta (40+'20mg'$ /day), Adderall ('20mg'$  4X/day), Xanax ('1mg'$ , 3X/day), and Klonopin ('1mg'$  3X/day).  ?Patient has requested a video Webex session due to the corona virus. She is at home and I am in my home office. ? ?[name] says she has an appointment May 3rd for a repeat mammogram. She can now feel the lump in her breast and is worried. She is managing it all very well and recognizes it is not useful to get overly agitated this early. She is also concerned that her aunt has symptoms of dementia. Makes her feel that the risk of her getting dementia is increased. Moods are stable. Says  [name] starts work soon and he is looking forward to it. Will miss  having him around the house.  ?Next session, [name] wants to explore the comment her mother made to her about being sorry and having kept her biological family from her.                           ?  ?                              ?  ?        ?[name], PhD  Time:2:15p-3:00 45 minutes. ? ? ? ? ? ? ? ? ? ? ? ? ? ? ? ? ? ? ? ? ? ? ?

## 2021-05-29 LAB — COLOGUARD: COLOGUARD: NEGATIVE

## 2021-05-30 ENCOUNTER — Ambulatory Visit: Payer: BC Managed Care – PPO | Admitting: Psychology

## 2021-05-31 ENCOUNTER — Ambulatory Visit
Admission: RE | Admit: 2021-05-31 | Discharge: 2021-05-31 | Disposition: A | Discharge status: Home / Self Care | Payer: BC Managed Care – PPO | Source: Ambulatory Visit | Attending: Obstetrics and Gynecology | Admitting: Obstetrics and Gynecology

## 2021-05-31 ENCOUNTER — Ambulatory Visit: Payer: BC Managed Care – PPO

## 2021-05-31 DIAGNOSIS — R928 Other abnormal and inconclusive findings on diagnostic imaging of breast: Secondary | ICD-10-CM

## 2021-06-02 ENCOUNTER — Ambulatory Visit (INDEPENDENT_AMBULATORY_CARE_PROVIDER_SITE_OTHER): Payer: Medicare Other | Admitting: Psychology

## 2021-06-02 DIAGNOSIS — F319 Bipolar disorder, unspecified: Secondary | ICD-10-CM | POA: Diagnosis not present

## 2021-06-02 NOTE — Progress Notes (Signed)
?[name] is a 51 y.o. female patient  ? ?Treatment Plan: ?Date: 06/02/2021  ?Diagnosis ?296.80 (Unspecified bipolar or related disorder) [n/a]  ?Symptoms ?Depressed or irritable mood. (Status: maintained) -- No Description Entered  ?Diminished interest in or enjoyment of activities. (Status: maintained) -- No Description Entered  ?Lack of energy. (Status: maintained) -- No Description Entered  ?Low self-esteem. (Status: maintained) -- No Description Entered  ?Medication Status ?compliance  ?Safety ?none  ?If Suicidal or Homicidal State Action Taken: unspecified  ?Current Risk: low ?Medications ?Adderall (Dosage: '20mg'$  4X/day)  ?Klonapin (Dosage: '1mg'$  3X/day)  ?Lamictal (Dosage: '200mg'$  2x/day)  ?Nuedexta (Dosage: '20mg'$ )  ?Vyralar (Dosage: '6mg'$ )  ?Xanax (Dosage: '1mg'$  3X/day)  ?Objectives ?Related Problem: Develop healthy cognitive patterns and beliefs about self and the world that lead to alleviation and help prevent the relapse of mood episodes. ?Description: State no longer having thoughts of self-harm. ?Target Date: 2022-01-07 ?Frequency: Daily ?Modality: individual ?Progress: 80% ? ?Related Problem: Develop healthy cognitive patterns and beliefs about self and the world that lead to alleviation and help prevent the relapse of mood episodes. ?Description: Verbalize any history of past and present suicidal thoughts and actions. ?Target Date: 2022-01-07 ?Frequency: Daily ?Modality: individual ?Progress: 80% ? ?Related Problem: Develop healthy cognitive patterns and beliefs about self and the world that lead to alleviation and help prevent the relapse of mood episodes. ?Description: Take prescribed medications as directed. ?Target Date: 2022-01-07 ?Frequency: Daily ?Modality: individual ?Progress: 90% ? ?Related Problem: Develop healthy cognitive patterns and beliefs about self and the world that lead to alleviation and help prevent the relapse of mood episodes. ?Description: Discuss and resolve troubling  personal and interpersonal issues. ?Target Date: 2022-01-07 ?Frequency: Daily ?Modality: individual ?Progress: 80% ? ?Client Response ?full compliance  ?Service Location ?Location, [name] [place], [place] [address] -virtual ?Service Code ?cpt [id number]  ?Emotion regulation skills  ?Related past to present  ?Rationally challenge thoughts or beliefs/cognitive restructuring  ?Identify/label emotions  ?Validate/empathize  ?Self care activities  ?Lifestyle change (exercise, nutrition)  ?Self-monitoring  ?Facilitate problem solving  ?Session notes:  ?f31.9 Bipolar Unspecified.  ?Goals: Mood stabilization, improved marital relationship, improve self esteem and elimination of self harm thoughts/tendencies. Also, patient struggles with leaving home comfortably and seeks to improve this ability. Target date for these goals is January, 2023. In addition, she needs to improve self care behaviors due to her weight and various medical conditions. Target date for this goal is Feb., 2022. Date revised to December, 2023 to allow time for behavioral implementation. Reports periodic outbursts of rage/anger. Seeks to minimize. Target date is December, 2023. Patient has realized improvement in all areas. Requests additional treatment to facilitate maintenance of progress. Goal date 12-23  ?Meds: Vyralar ('6mg'$ ), Lamictal ('100mg'$  2X/day), Nuedexta (40+'20mg'$ /day), Adderall ('20mg'$  4X/day), Xanax ('1mg'$ , 3X/day), and Klonopin ('1mg'$  3X/day).  ?Patient has requested a video Webex session due to the corona virus. She is at home and I am in my home office. ? 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 She is very relieved, but the process of getting the information was less than satisfying. She and [name] did not get the opportunity together to ask questions. ?[name] says she is supposed to be going to visit her parents today, but she is not feeling well and is not sure she can make it. She says that [name]  started his new job and will be working [employer]. He likes it and [name] is glad that he  has a job. Even though today is a "bad day", she has generally been feeling good/stable.  ?                              ?  ?                              ?  ?        ?Jessica Morel, PhD  Time:11:30a-12:15p 45 minutes. ? ? ? ? ? ? ? ? ? ? ? ? ? ? ? ? ? ? ? ? ? ? ?

## 2021-06-06 ENCOUNTER — Ambulatory Visit (INDEPENDENT_AMBULATORY_CARE_PROVIDER_SITE_OTHER): Payer: Medicare Other | Admitting: Psychology

## 2021-06-06 DIAGNOSIS — F319 Bipolar disorder, unspecified: Secondary | ICD-10-CM | POA: Diagnosis not present

## 2021-06-06 NOTE — Progress Notes (Signed)
[name] is a 51 y.o. female patient  ? ?Treatment Plan: ?Date: 06/06/2021  ?Diagnosis ?296.80 (Unspecified bipolar or related disorder) [n/a]  ?Symptoms ?Depressed or irritable mood. (Status: maintained) -- No Description Entered  ?Diminished interest in or enjoyment of activities. (Status: maintained) -- No Description Entered  ?Lack of energy. (Status: maintained) -- No Description Entered  ?Low self-esteem. (Status: maintained) -- No Description Entered  ?Medication Status ?compliance  ?Safety ?none  ?If Suicidal or Homicidal State Action Taken: unspecified  ?Current Risk: low ?Medications ?Adderall (Dosage: '20mg'$  4X/day)  ?Klonapin (Dosage: '1mg'$  3X/day)  ?Lamictal (Dosage: '200mg'$  2x/day)  ?Nuedexta (Dosage: '20mg'$ )  ?Vyralar (Dosage: '6mg'$ )  ?Xanax (Dosage: '1mg'$  3X/day)  ?Objectives ?Related Problem: Develop healthy cognitive patterns and beliefs about self and the world that lead to alleviation and help prevent the relapse of mood episodes. ?Description: State no longer having thoughts of self-harm. ?Target Date: 2022-01-07 ?Frequency: Daily ?Modality: individual ?Progress: 80% ? ?Related Problem: Develop healthy cognitive patterns and beliefs about self and the world that lead to alleviation and help prevent the relapse of mood episodes. ?Description: Verbalize any history of past and present suicidal thoughts and actions. ?Target Date: 2022-01-07 ?Frequency: Daily ?Modality: individual ?Progress: 80% ? ?Related Problem: Develop healthy cognitive patterns and beliefs about self and the world that lead to alleviation and help prevent the relapse of mood episodes. ?Description: Take prescribed medications as directed. ?Target Date: 2022-01-07 ?Frequency: Daily ?Modality: individual ?Progress: 90% ? ?Related Problem: Develop healthy cognitive patterns and beliefs about self and the world that lead to alleviation and help prevent the relapse of mood episodes. ?Description: Discuss and resolve troubling personal  and interpersonal issues. ?Target Date: 2022-01-07 ?Frequency: Daily ?Modality: individual ?Progress: 80% ? ?Client Response ?full compliance  ?Service Location ?Location, [name] [place],  [address] -virtual ?Service Code ?cpt [id number]  ?Emotion regulation skills  ?Related past to present  ?Rationally challenge thoughts or beliefs/cognitive restructuring  ?Identify/label emotions  ?Validate/empathize  ?Self care activities  ?Lifestyle change (exercise, nutrition)  ?Self-monitoring  ?Facilitate problem solving  ?Session notes:  ?f31.9 Bipolar Unspecified.  ?Goals: Mood stabilization, improved marital relationship, improve self esteem and elimination of self harm thoughts/tendencies. Also, patient struggles with leaving home comfortably and seeks to improve this ability. Target date for these goals is January, 2023. In addition, she needs to improve self care behaviors due to her weight and various medical conditions. Target date for this goal is Feb., 2022. Date revised to December, 2023 to allow time for behavioral implementation. Reports periodic outbursts of rage/anger. Seeks to minimize. Target date is December, 2023. Patient has realized improvement in all areas. Requests additional treatment to facilitate maintenance of progress. Goal date 12-23  ?Meds: Vyralar ('6mg'$ ), Lamictal ('100mg'$  2X/day), Nuedexta (40+'20mg'$ /day), Adderall ('20mg'$  4X/day), Xanax ('1mg'$ , 3X/day), and Klonopin ('1mg'$  3X/day).  ?Patient has requested a video Webex session due to the corona virus. She is at home and I am in my home office. ? ?Linnet says she is getting "lonely" because [name] is gone all the time for work. Says that she wants to find something to occupy her time. She is thinking of visiting family more frequently. She prefers to find things out of the house rather than an activity at home. She is still doing her drumming every day, but not taking lessons. ?She brought up the comment her mother said that she was "sorry for  how I raised you"? She was referring to keeping her from her biological family. Krystale never followed up  to discuss. She has anger about being kept in the dark about the adoption for so long. Says she was surprised when she found out identity of her parents. She thinks she would have been more "settled" in her own skin from a much younger age. She says "everything would have been better". She cries as she says that her father told her "you have been in therapy so long and you are still not cured". She said it makes her feel "broken" even though she is far more stable than she was in the past.     ?                              ?  ?                              ?  ?        ?[name], PhD  Time:2:15p-3:00p 45 minutes. ? ? ? ? ? ? ? ? ? ? ? ? ? ? ? ? ? ? ? ? ? ? ?

## 2021-06-13 ENCOUNTER — Ambulatory Visit (INDEPENDENT_AMBULATORY_CARE_PROVIDER_SITE_OTHER): Payer: Medicare Other | Admitting: Psychology

## 2021-06-13 DIAGNOSIS — F319 Bipolar disorder, unspecified: Secondary | ICD-10-CM

## 2021-06-13 NOTE — Progress Notes (Signed)
? ? ? ? ? ? ? ? ? ? ? ? ? ? ?[name] is a 51 y.o. female patient  ? ?Treatment Plan: ?Date: 06/13/2021  ?Diagnosis ?296.80 (Unspecified bipolar or related disorder) [n/a]  ?Symptoms ?Depressed or irritable mood. (Status: maintained) -- No Description Entered  ?Diminished interest in or enjoyment of activities. (Status: maintained) -- No Description Entered  ?Lack of energy. (Status: maintained) -- No Description Entered  ?Low self-esteem. (Status: maintained) -- No Description Entered  ?Medication Status ?compliance  ?Safety ?none  ?If Suicidal or Homicidal State Action Taken: unspecified  ?Current Risk: low ?Medications ?Adderall (Dosage: '20mg'$  4X/day)  ?Klonapin (Dosage: '1mg'$  3X/day)  ?Lamictal (Dosage: '200mg'$  2x/day)  ?Nuedexta (Dosage: '20mg'$ )  ?Vyralar (Dosage: '6mg'$ )  ?Xanax (Dosage: '1mg'$  3X/day)  ?Objectives ?Related Problem: Develop healthy cognitive patterns and beliefs about self and the world that lead to alleviation and help prevent the relapse of mood episodes. ?Description: State no longer having thoughts of self-harm. ?Target Date: 2022-01-07 ?Frequency: Daily ?Modality: individual ?Progress: 80% ? ?Related Problem: Develop healthy cognitive patterns and beliefs about self and the world that lead to alleviation and help prevent the relapse of mood episodes. ?Description: Verbalize any history of past and present suicidal thoughts and actions. ?Target Date: 2022-01-07 ?Frequency: Daily ?Modality: individual ?Progress: 80% ? ?Related Problem: Develop healthy cognitive patterns and beliefs about self and the world that lead to alleviation and help prevent the relapse of mood episodes. ?Description: Take prescribed medications as directed. ?Target Date: 2022-01-07 ?Frequency: Daily ?Modality: individual ?Progress: 90% ? ?Related Problem: Develop healthy cognitive patterns and beliefs about self and the world that lead to alleviation and help prevent the relapse of mood episodes. ?Description: Discuss  and resolve troubling personal and interpersonal issues. ?Target Date: 2022-01-07 ?Frequency: Daily ?Modality: individual ?Progress: 80% ? ?Client Response ?full compliance  ?Service Location ?Location, [name] [place], [place] [address] -virtual ?Service Code ?cpt [id number]  ?Emotion regulation skills  ?Related past to present  ?Rationally challenge thoughts or beliefs/cognitive restructuring  ?Identify/label emotions  ?Validate/empathize  ?Self care activities  ?Lifestyle change (exercise, nutrition)  ?Self-monitoring  ?Facilitate problem solving  ?Session notes:  ?f31.9 Bipolar Unspecified.  ?Goals: Mood stabilization, improved marital relationship, improve self esteem and elimination of self harm thoughts/tendencies. Also, patient struggles with leaving home comfortably and seeks to improve this ability. Target date for these goals is January, 2023. In addition, she needs to improve self care behaviors due to her weight and various medical conditions. Target date for this goal is Feb., 2022. Date revised to December, 2023 to allow time for behavioral implementation. Reports periodic outbursts of rage/anger. Seeks to minimize. Target date is December, 2023. Patient has realized improvement in all areas. Requests additional treatment to facilitate maintenance of progress. Goal date 12-23  ?Meds: Vyralar ('6mg'$ ), Lamictal ('100mg'$  2X/day), Nuedexta (40+'20mg'$ /day), Adderall ('20mg'$  4X/day), Xanax ('1mg'$ , 3X/day), and Klonopin ('1mg'$  3X/day).  ?Patient has requested a video Webex session due to the corona virus. She is at home and I am in my home office. ? ?[name] says she spent time with her mother on Mother's Day. She was upset that her sibs did not come visit their mother. We discussed the need for her and her sibs to start making plans for their mother's long term care. Her memory is greatly compromised and getting worse. [name] says it will be difficult to rally the support of her sibs. We talked about strategies to  enlist her family into these difficult discussions.          ?                              ?  ?                              ?  ?        ?  [name], PhD  Time:2:15p-3:00p 45 minutes. ? ? ? ? ? ? ? ? ? ? ? ? ? ? ? ? ? ? ? ? ? ? ?

## 2021-06-20 ENCOUNTER — Ambulatory Visit (INDEPENDENT_AMBULATORY_CARE_PROVIDER_SITE_OTHER): Payer: Medicare Other | Admitting: Psychology

## 2021-06-20 DIAGNOSIS — F319 Bipolar disorder, unspecified: Secondary | ICD-10-CM

## 2021-06-20 NOTE — Progress Notes (Signed)
CANDIE GINTZ is a 51 y.o. female patient   Treatment Plan: Date: 06/20/2021  Diagnosis 296.80 (Unspecified bipolar or related disorder) [n/a]  Symptoms Depressed or irritable mood. (Status: maintained) -- No Description Entered  Diminished interest in or enjoyment of activities. (Status: maintained) -- No Description Entered  Lack of energy. (Status: maintained) -- No Description Entered  Low self-esteem. (Status: maintained) -- No Description Entered  Medication Status compliance  Safety none  If Suicidal or Homicidal State Action Taken: unspecified  Current Risk: low Medications Adderall (Dosage: '20mg'$  4X/day)  Klonapin (Dosage: '1mg'$  3X/day)  Lamictal (Dosage: '200mg'$  2x/day)  Nuedexta (Dosage: '20mg'$ )  Vyralar (Dosage: '6mg'$ )  Xanax (Dosage: '1mg'$  3X/day)  Objectives Related Problem: Develop healthy cognitive patterns and beliefs about self and the world that lead to alleviation and help prevent the relapse of mood episodes. Description: State no longer having thoughts of self-harm. Target Date: 2022-01-07 Frequency: Daily Modality: individual Progress: 80%  Related Problem: Develop healthy cognitive patterns and beliefs about self and the world that lead to alleviation and help prevent the relapse of mood episodes. Description: Verbalize any history of past and present suicidal thoughts and actions. Target Date: 2022-01-07 Frequency: Daily Modality: individual Progress: 80%  Related Problem: Develop healthy cognitive patterns and beliefs about self and the world that lead to alleviation and help prevent the relapse of mood episodes. Description: Take prescribed medications as directed. Target Date: 2022-01-07 Frequency: Daily Modality: individual Progress: 90%  Related Problem: Develop healthy cognitive patterns and beliefs about self and the world that lead to alleviation and help prevent the relapse of mood episodes. Description: Discuss and resolve  troubling personal and interpersonal issues. Target Date: 2022-01-07 Frequency: Daily Modality: individual Progress: 80%  Client Response full compliance  Service Location Location, [name] [place], [place] [address] -virtual Service Code cpt [phone]  Emotion regulation skills  Related past to present  Rationally challenge thoughts or beliefs/cognitive restructuring  Identify/label emotions  Validate/empathize  Self care activities  Lifestyle change (exercise, nutrition)  Self-monitoring  Facilitate problem solving  Session notes:  f31.9 Bipolar Unspecified.  Goals: Mood stabilization, improved marital relationship, improve self esteem and elimination of self harm thoughts/tendencies. Also, patient struggles with leaving home comfortably and seeks to improve this ability. Target date for these goals is January, 2023. In addition, she needs to improve self care behaviors due to her weight and various medical conditions. Target date for this goal is Feb., 2022. Date revised to December, 2023 to allow time for behavioral implementation. Reports periodic outbursts of rage/anger. Seeks to minimize. Target date is December, 2023. Patient has realized improvement in all areas. Requests additional treatment to facilitate maintenance of progress. Goal date 12-23  Meds: Vyralar ('6mg'$ ), Lamictal ('100mg'$  2X/day), Nuedexta (40+'20mg'$ /day), Adderall ('20mg'$  4X/day), Xanax ('1mg'$ , 3X/day), and Klonopin ('1mg'$  3X/day).  Patient has requested a video Webex session due to the corona virus. She is at home and I am in my home office.  Leighanna says she heard from a relative she has not spoken with in 11 years. She told Hopie that Beverly's family treated her poorly and did a bad job of dealing with her adoption. She was apparently trying to agitate the family system and Shyane did not participate. Good boundary/limit setting.  No progress to date on family discussion of how to manage parents. Her reluctance  blocks any effort to address the situation with her family. She agree that it needs to happen and will work toward  getting it organized.                                                                                    [name], PhD  Time:2:15p-3:00p 45 minutes.

## 2021-06-26 ENCOUNTER — Other Ambulatory Visit: Payer: Self-pay | Admitting: Cardiology

## 2021-06-26 ENCOUNTER — Other Ambulatory Visit: Payer: Self-pay | Admitting: Family Medicine

## 2021-06-27 ENCOUNTER — Ambulatory Visit (INDEPENDENT_AMBULATORY_CARE_PROVIDER_SITE_OTHER): Payer: Medicare Other | Admitting: Psychology

## 2021-06-27 DIAGNOSIS — F319 Bipolar disorder, unspecified: Secondary | ICD-10-CM

## 2021-06-27 NOTE — Progress Notes (Signed)
[name] is a 51 y.o. female patient   Treatment Plan: Date: 06/27/2021  Diagnosis 296.80 (Unspecified bipolar or related disorder) [n/a]  Symptoms Depressed or irritable mood. (Status: maintained) -- No Description Entered  Diminished interest in or enjoyment of activities. (Status: maintained) -- No Description Entered  Lack of energy. (Status: maintained) -- No Description Entered  Low self-esteem. (Status: maintained) -- No Description Entered  Medication Status compliance  Safety none  If Suicidal or Homicidal State Action Taken: unspecified  Current Risk: low Medications Adderall (Dosage: '20mg'$  4X/day)  Klonapin (Dosage: '1mg'$  3X/day)  Lamictal (Dosage: '200mg'$  2x/day)  Nuedexta (Dosage: '20mg'$ )  Vyralar (Dosage: '6mg'$ )  Xanax (Dosage: '1mg'$  3X/day)  Objectives Related Problem: Develop healthy cognitive patterns and beliefs about self and the world that lead to alleviation and help prevent the relapse of mood episodes. Description: State no longer having thoughts of self-harm. Target Date: 2022-01-07 Frequency: Daily Modality: individual Progress: 80%  Related Problem: Develop healthy cognitive patterns and beliefs about self and the world that lead to alleviation and help prevent the relapse of mood episodes. Description: Verbalize any history of past and present suicidal thoughts and actions. Target Date: 2022-01-07 Frequency: Daily Modality: individual Progress: 80%  Related Problem: Develop healthy cognitive patterns and beliefs about self and the world that lead to alleviation and help prevent the relapse of mood episodes. Description: Take prescribed medications as directed. Target Date: 2022-01-07 Frequency: Daily Modality: individual Progress: 90%  Related Problem: Develop healthy cognitive patterns and beliefs about self and the world that lead to alleviation and help prevent the relapse of mood episodes. Description: Discuss and  resolve troubling personal and interpersonal issues. Target Date: 2022-01-07 Frequency: Daily Modality: individual Progress: 80%  Client Response full compliance  Service Location Location, [name] [place], [place] [address] -virtual Service Code cpt [phone]  Emotion regulation skills  Related past to present  Rationally challenge thoughts or beliefs/cognitive restructuring  Identify/label emotions  Validate/empathize  Self care activities  Lifestyle change (exercise, nutrition)  Self-monitoring  Facilitate problem solving  Session notes:  f31.9 Bipolar Unspecified.  Goals: Mood stabilization, improved marital relationship, improve self esteem and elimination of self harm thoughts/tendencies. Also, patient struggles with leaving home comfortably and seeks to improve this ability. Target date for these goals is January, 2023. In addition, she needs to improve self care behaviors due to her weight and various medical conditions. Target date for this goal is Feb., 2022. Date revised to December, 2023 to allow time for behavioral implementation. Reports periodic outbursts of rage/anger. Seeks to minimize. Target date is December, 2023. Patient has realized improvement in all areas. Requests additional treatment to facilitate maintenance of progress. Goal date 12-23  Meds: Vyralar ('6mg'$ ), Lamictal ('100mg'$  2X/day), Nuedexta (40+'20mg'$ /day), Adderall ('20mg'$  4X/day), Xanax ('1mg'$ , 3X/day), and Klonopin ('1mg'$  3X/day).  Patient has requested a video session due to the corona virus. She is at home and I am in my home office.  [name] reports she has sleeping excessively lately and is still fatigued. Says she is otherwise feeling "fine". This is the first time in a long while that [name] has been sleeping excessively (10-12 hours+). [name] says that she went to see her parents last week and is worried that her mother is now struggling with her vision. She feels sad seeing her parents deteriorate. She  states that she feels she has been naive about "life in general" and adds "I always  thought people are all good, but they are not". She then contradicts herself stating that she was told not to trust people. She responds to this contradiction saying she is not happy with how she was raised and that she hs a lot of "regrets". [name] says "I am much better than 25 years ago in that I don't want to die", but then says "I am sometimes afraid to live". Will continue with this thought at next session.                                                                                          [name], PhD  Time:2:15p-3:00p 45 minutes.

## 2021-06-28 ENCOUNTER — Telehealth: Payer: Self-pay | Admitting: Family Medicine

## 2021-06-28 NOTE — Telephone Encounter (Signed)
Hasn't been seen in over a year, needs an appointment.

## 2021-06-28 NOTE — Telephone Encounter (Signed)
Pt requesting refill of  lidocaine (LIDODERM) 5 %   her insurance is terming at the end of July.    [facility] [place], [place] [address] AT [address] Phone:  [phone]  Fax:  [phone]

## 2021-07-04 ENCOUNTER — Ambulatory Visit (INDEPENDENT_AMBULATORY_CARE_PROVIDER_SITE_OTHER): Payer: Medicare Other | Admitting: Psychology

## 2021-07-04 DIAGNOSIS — F319 Bipolar disorder, unspecified: Secondary | ICD-10-CM

## 2021-07-04 NOTE — Progress Notes (Signed)
[name] is a 51 y.o. female patient   Treatment Plan: Date: 07/04/2021  Diagnosis 296.80 (Unspecified bipolar or related disorder) [n/a]  Symptoms Depressed or irritable mood. (Status: maintained) -- No Description Entered  Diminished interest in or enjoyment of activities. (Status: maintained) -- No Description Entered  Lack of energy. (Status: maintained) -- No Description Entered  Low self-esteem. (Status: maintained) -- No Description Entered  Medication Status compliance  Safety none  If Suicidal or Homicidal State Action Taken: unspecified  Current Risk: low Medications Adderall (Dosage: '20mg'$  4X/day)  Klonapin (Dosage: '1mg'$  3X/day)  Lamictal (Dosage: '200mg'$  2x/day)  Nuedexta (Dosage: '20mg'$ )  Vyralar (Dosage: '6mg'$ )  Xanax (Dosage: '1mg'$  3X/day)  Objectives Related Problem: Develop healthy cognitive patterns and beliefs about self and the world that lead to alleviation and help prevent the relapse of mood episodes. Description: State no longer having thoughts of self-harm. Target Date: 2022-01-07 Frequency: Daily Modality: individual Progress: 80%  Related Problem: Develop healthy cognitive patterns and beliefs about self and the world that lead to alleviation and help prevent the relapse of mood episodes. Description: Verbalize any history of past and present suicidal thoughts and actions. Target Date: 2022-01-07 Frequency: Daily Modality: individual Progress: 80%  Related Problem: Develop healthy cognitive patterns and beliefs about self and the world that lead to alleviation and help prevent the relapse of mood episodes. Description: Take prescribed medications as directed. Target Date: 2022-01-07 Frequency: Daily Modality: individual Progress: 90%  Related Problem: Develop healthy cognitive patterns and beliefs about self and the world that lead to alleviation and help prevent the relapse of mood episodes. Description: Discuss and  resolve troubling personal and interpersonal issues. Target Date: 2022-01-07 Frequency: Daily Modality: individual Progress: 80%  Client Response full compliance  Service Location Location, [name] [place], [place] [address] -virtual Service Code cpt [phone]  Emotion regulation skills  Related past to present  Rationally challenge thoughts or beliefs/cognitive restructuring  Identify/label emotions  Validate/empathize  Self care activities  Lifestyle change (exercise, nutrition)  Self-monitoring  Facilitate problem solving  Session notes:  f31.9 Bipolar Unspecified.  Goals: Mood stabilization, improved marital relationship, improve self esteem and elimination of self harm thoughts/tendencies. Also, patient struggles with leaving home comfortably and seeks to improve this ability. Target date for these goals is January, 2023. In addition, she needs to improve self care behaviors due to her weight and various medical conditions. Target date for this goal is Feb., 2022. Date revised to December, 2023 to allow time for behavioral implementation. Reports periodic outbursts of rage/anger. Seeks to minimize. Target date is December, 2023. Patient has realized improvement in all areas. Requests additional treatment to facilitate maintenance of progress. Goal date 12-23  Meds: Vyralar ('6mg'$ ),Nuedexta (40+'20mg'$ /day), Adderall ('20mg'$  4X/day), Xanax ('1mg'$ , 3X/day), and Klonopin ('1mg'$  3X/day).  Patient has requested a video session due to the corona virus. She is at home and I am in my home office.  [name] says that her friend was in the hospital for cardiac issues. She is now out but [name] is worried. Will remain in touch to be available. [name] is also worried about her father, who is having some issues with his eyes. She reports that things at home are stable. Says she gets enraged at times, but is able to manage and not act out. [name] is liking his job and they are comfortable with their  financial situation.  Moods are relatively good and no  outbursts reported.                                                                                                   [name], PhD  Time:2:15p-3:00p 45 minutes.

## 2021-07-11 ENCOUNTER — Ambulatory Visit: Payer: Medicare Other | Admitting: Psychology

## 2021-07-18 ENCOUNTER — Ambulatory Visit (INDEPENDENT_AMBULATORY_CARE_PROVIDER_SITE_OTHER): Payer: Medicare Other | Admitting: Psychology

## 2021-07-18 DIAGNOSIS — F319 Bipolar disorder, unspecified: Secondary | ICD-10-CM

## 2021-07-18 NOTE — Progress Notes (Signed)
[name] is a 51 y.o. female patient   Treatment Plan: Date: 07/18/2021  Diagnosis 296.80 (Unspecified bipolar or related disorder) [n/a]  Symptoms Depressed or irritable mood. (Status: maintained) -- No Description Entered  Diminished interest in or enjoyment of activities. (Status: maintained) -- No Description Entered  Lack of energy. (Status: maintained) -- No Description Entered  Low self-esteem. (Status: maintained) -- No Description Entered  Medication Status compliance  Safety none  If Suicidal or Homicidal State Action Taken: unspecified  Current Risk: low Medications Adderall (Dosage: '20mg'$  4X/day)  Klonapin (Dosage: '1mg'$  3X/day)  Lamictal (Dosage: '200mg'$  2x/day)  Nuedexta (Dosage: '20mg'$ )  Vyralar (Dosage: '6mg'$ )  Xanax (Dosage: '1mg'$  3X/day)  Objectives Related Problem: Develop healthy cognitive patterns and beliefs about self and the world that lead to alleviation and help prevent the relapse of mood episodes. Description: State no longer having thoughts of self-harm. Target Date: 2022-01-07 Frequency: Daily Modality: individual Progress: 80%  Related Problem: Develop healthy cognitive patterns and beliefs about self and the world that lead to alleviation and help prevent the relapse of mood episodes. Description: Verbalize any history of past and present suicidal thoughts and actions. Target Date: 2022-01-07 Frequency: Daily Modality: individual Progress: 80%  Related Problem: Develop healthy cognitive patterns and beliefs about self and the world that lead to alleviation and help prevent the relapse of mood episodes. Description: Take prescribed medications as directed. Target Date: 2022-01-07 Frequency: Daily Modality: individual Progress: 90%  Related Problem: Develop healthy cognitive patterns and beliefs about self and the world that lead to alleviation and help prevent the relapse of mood  episodes. Description: Discuss and resolve troubling personal and interpersonal issues. Target Date: 2022-01-07 Frequency: Daily Modality: individual Progress: 80%  Client Response full compliance  Service Location Location, [name] [place], [place] [address] -virtual Service Code cpt [phone]  Emotion regulation skills  Related past to present  Rationally challenge thoughts or beliefs/cognitive restructuring  Identify/label emotions  Validate/empathize  Self care activities  Lifestyle change (exercise, nutrition)  Self-monitoring  Facilitate problem solving  Session notes:  f31.9 Bipolar Unspecified.  Goals: Mood stabilization, improved marital relationship, improve self esteem and elimination of self harm thoughts/tendencies. Also, patient struggles with leaving home comfortably and seeks to improve this ability. Target date for these goals is January, 2023. In addition, she needs to improve self care behaviors due to her weight and various medical conditions. Target date for this goal is Feb., 2022. Date revised to December, 2023 to allow time for behavioral implementation. Reports periodic outbursts of rage/anger. Seeks to minimize. Target date is December, 2023. Patient has realized improvement in all areas. Requests additional treatment to facilitate maintenance of progress. Goal date 12-23  Meds: Vyralar ('6mg'$ ),Nuedexta (40+'20mg'$ /day), Adderall ('20mg'$  4X/day), Xanax ('1mg'$ , 3X/day), and Klonopin ('1mg'$  3X/day).  Patient has requested a video session due to the corona virus. She is at home and I am in my home office.  [name] says her sister "unfriended" her on Facebook. [name] does not know why, but is irritated with her. She is trying not to let her sister's behavior/rejection bother her, but is quite frustrated.  She said that her dog was just diagnosed with cancer. The vet said to give the dog a biologic. Will decide next week.  They had a "family celebration" on Father's Day.  It went well and  most people (except sister) were well-behaved. She is feeling good and says that [name] has settled into his job. They are in a good routine and getting along well. No significant changes.                                                                                                     [name], PhD  Time:2:15p-3:00p 45 minutes.

## 2021-07-19 NOTE — Telephone Encounter (Signed)
Called patient at number on file, LM to call office to schedule OV

## 2021-07-25 ENCOUNTER — Ambulatory Visit (INDEPENDENT_AMBULATORY_CARE_PROVIDER_SITE_OTHER): Payer: Medicare Other | Admitting: Psychology

## 2021-07-25 DIAGNOSIS — F319 Bipolar disorder, unspecified: Secondary | ICD-10-CM

## 2021-07-25 NOTE — Progress Notes (Signed)
[name] is a 51 y.o. female patient   Treatment Plan: Date: 07/25/2021  Diagnosis 296.80 (Unspecified bipolar or related disorder) [n/a]  Symptoms Depressed or irritable mood. (Status: maintained) -- No Description Entered  Diminished interest in or enjoyment of activities. (Status: maintained) -- No Description Entered  Lack of energy. (Status: maintained) -- No Description Entered  Low self-esteem. (Status: maintained) -- No Description Entered  Medication Status compliance  Safety none  If Suicidal or Homicidal State Action Taken: unspecified  Current Risk: low Medications Adderall (Dosage: 20mg  4X/day)  Klonapin (Dosage: 1mg  3X/day)  Lamictal (Dosage: 200mg  2x/day)  Nuedexta (Dosage: 20mg )  Vyralar (Dosage: 6mg )  Xanax (Dosage: 1mg  3X/day)  Objectives Related Problem: Develop healthy cognitive patterns and beliefs about self and the world that lead to alleviation and help prevent the relapse of mood episodes. Description: State no longer having thoughts of self-harm. Target Date: 2022-01-07 Frequency: Daily Modality: individual Progress: 80%  Related Problem: Develop healthy cognitive patterns and beliefs about self and the world that lead to alleviation and help prevent the relapse of mood episodes. Description: Verbalize any history of past and present suicidal thoughts and actions. Target Date: 2022-01-07 Frequency: Daily Modality: individual Progress: 80%  Related Problem: Develop healthy cognitive patterns and beliefs about self and the world that lead to alleviation and help prevent the relapse of mood episodes. Description: Take prescribed medications as directed. Target Date: 2022-01-07 Frequency: Daily Modality: individual Progress: 90%  Related Problem: Develop healthy cognitive patterns and beliefs about self and the world that lead to alleviation and help prevent the relapse of mood episodes. Description: Discuss and resolve troubling  personal and interpersonal issues. Target Date: 2022-01-07 Frequency: Daily Modality: individual Progress: 80%  Client Response full compliance  Service Location Location, [address] [place], [place] [address] -virtual Service Code cpt [phone]  Emotion regulation skills  Related past to present  Rationally challenge thoughts or beliefs/cognitive restructuring  Identify/label emotions  Validate/empathize  Self care activities  Lifestyle change (exercise, nutrition)  Self-monitoring  Facilitate problem solving  Session notes:  f31.9 Bipolar Unspecified.  Goals: Mood stabilization, improved marital relationship, improve self esteem and elimination of self harm thoughts/tendencies. Also, patient struggles with leaving home comfortably and seeks to improve this ability. Target date for these goals is January, 2023. In addition, she needs to improve self care behaviors due to her weight and various medical conditions. Target date for this goal is Feb., 2022. Date revised to December, 2023 to allow time for behavioral implementation. Reports periodic outbursts of rage/anger. Seeks to minimize. Target date is December, 2023. Patient has realized improvement in all areas. Requests additional treatment to facilitate maintenance of progress. Goal date 12-23  Meds: Vyralar (6mg ),Nuedexta (40+20mg /day), Adderall (20mg  4X/day), Xanax (1mg , 3X/day), and Klonopin (1mg  3X/day).  Patient has requested a video session due to the corona virus. She is at home and I am in my home office.  [name] states that she has had a good week and is "in a good mood". She says her husband [name] has some medical issues that are making him miserable. She also says that her mother is getting increasing depressed and her father has a hard time getting her out of bed. She will not seek any professional help. She tells Lynore that she is depressed because she is aware of her failing memory. [name] does not feel she can help  in any way. She is resigned to the fact that her mother is deteriorating. Will  consider talking with her.                                                                                                           [name], PhD  Time:2:15p-3:00p 45 minutes.

## 2021-08-02 ENCOUNTER — Ambulatory Visit (INDEPENDENT_AMBULATORY_CARE_PROVIDER_SITE_OTHER): Payer: Medicare Other | Admitting: Psychology

## 2021-08-02 DIAGNOSIS — F319 Bipolar disorder, unspecified: Secondary | ICD-10-CM | POA: Diagnosis not present

## 2021-08-02 NOTE — Progress Notes (Signed)
[name] is a 51 y.o. female patient   Treatment Plan: Date: 08/02/2021  Diagnosis 296.80 (Unspecified bipolar or related disorder) [n/a]  Symptoms Depressed or irritable mood. (Status: maintained) -- No Description Entered  Diminished interest in or enjoyment of activities. (Status: maintained) -- No Description Entered  Lack of energy. (Status: maintained) -- No Description Entered  Low self-esteem. (Status: maintained) -- No Description Entered  Medication Status compliance  Safety none  If Suicidal or Homicidal State Action Taken: unspecified  Current Risk: low Medications Adderall (Dosage: '20mg'$  4X/day)  Klonapin (Dosage: '1mg'$  3X/day)  Lamictal (Dosage: '200mg'$  2x/day)  Nuedexta (Dosage: '20mg'$ )  Vyralar (Dosage: '6mg'$ )  Xanax (Dosage: '1mg'$  3X/day)  Objectives Related Problem: Develop healthy cognitive patterns and beliefs about self and the world that lead to alleviation and help prevent the relapse of mood episodes. Description: State no longer having thoughts of self-harm. Target Date: 2022-01-07 Frequency: Daily Modality: individual Progress: 80%  Related Problem: Develop healthy cognitive patterns and beliefs about self and the world that lead to alleviation and help prevent the relapse of mood episodes. Description: Verbalize any history of past and present suicidal thoughts and actions. Target Date: 2022-01-07 Frequency: Daily Modality: individual Progress: 80%  Related Problem: Develop healthy cognitive patterns and beliefs about self and the world that lead to alleviation and help prevent the relapse of mood episodes. Description: Take prescribed medications as directed. Target Date: 2022-01-07 Frequency: Daily Modality: individual Progress: 90%  Related Problem: Develop healthy cognitive patterns and beliefs about self and the world that lead to alleviation and help prevent the relapse of mood episodes. Description:  Discuss and resolve troubling personal and interpersonal issues. Target Date: 2022-01-07 Frequency: Daily Modality: individual Progress: 80%  Client Response full compliance  Service Location Location, [name] [place], [place] [address] -virtual Service Code cpt [phone]  Emotion regulation skills  Related past to present  Rationally challenge thoughts or beliefs/cognitive restructuring  Identify/label emotions  Validate/empathize  Self care activities  Lifestyle change (exercise, nutrition)  Self-monitoring  Facilitate problem solving  Session notes:  f31.9 Bipolar Unspecified.  Goals: Mood stabilization, improved marital relationship, improve self esteem and elimination of self harm thoughts/tendencies. Also, patient struggles with leaving home comfortably and seeks to improve this ability. Target date for these goals is January, 2023. In addition, she needs to improve self care behaviors due to her weight and various medical conditions. Target date for this goal is Feb., 2022. Date revised to December, 2023 to allow time for behavioral implementation. Reports periodic outbursts of rage/anger. Seeks to minimize. Target date is December, 2023. Patient has realized improvement in all areas. Requests additional treatment to facilitate maintenance of progress. Goal date 12-23  Meds: Vyralar ('6mg'$ ),Nuedexta (40+'20mg'$ /day), Adderall ('20mg'$  4X/day), Xanax ('1mg'$ , 3X/day), and Klonopin ('1mg'$  3X/day).  Patient has requested a video session. She is at home and I am in my home office.  Latronda had a good week and several social engagements. Her uncle is in town and she is not going to see him. He recently severed their relation and she has not idea why he made that choice. She states that she has an appointment with Dr. Toy Care soon. She states she is not depressed, but she is not happy. Said we need to begin to address her happiness.  [name], PhD  Time:2:15p-3:00p 45 minutes.

## 2021-08-08 ENCOUNTER — Ambulatory Visit (INDEPENDENT_AMBULATORY_CARE_PROVIDER_SITE_OTHER): Payer: Medicare Other | Admitting: Psychology

## 2021-08-08 DIAGNOSIS — F319 Bipolar disorder, unspecified: Secondary | ICD-10-CM

## 2021-08-08 NOTE — Progress Notes (Signed)
[name] is a 51 y.o. female patient   Treatment Plan: Date: 08/08/2021  Diagnosis 296.80 (Unspecified bipolar or related disorder) [n/a]  Symptoms Depressed or irritable mood. (Status: maintained) -- No Description Entered  Diminished interest in or enjoyment of activities. (Status: maintained) -- No Description Entered  Lack of energy. (Status: maintained) -- No Description Entered  Low self-esteem. (Status: maintained) -- No Description Entered  Medication Status compliance  Safety none  If Suicidal or Homicidal State Action Taken: unspecified  Current Risk: low Medications Adderall (Dosage: 101m 4X/day)  Klonapin (Dosage: 152m3X/day)  Lamictal (Dosage: 20068mx/day)  Nuedexta (Dosage: 20m40mVyralar (Dosage: 6mg)91manax (Dosage: 1mg 326may)  Objectives Related Problem: Develop healthy cognitive patterns and beliefs about self and the world that lead to alleviation and help prevent the relapse of mood episodes. Description: State no longer having thoughts of self-harm. Target Date: 2022-01-07 Frequency: Daily Modality: individual Progress: 80%  Related Problem: Develop healthy cognitive patterns and beliefs about self and the world that lead to alleviation and help prevent the relapse of mood episodes. Description: Verbalize any history of past and present suicidal thoughts and actions. Target Date: 2022-01-07 Frequency: Daily Modality: individual Progress: 80%  Related Problem: Develop healthy cognitive patterns and beliefs about self and the world that lead to alleviation and help prevent the relapse of mood episodes. Description: Take prescribed medications as directed. Target Date: 2022-01-07 Frequency: Daily Modality: individual Progress: 90%  Related Problem: Develop healthy cognitive patterns and beliefs about self and the world that lead to alleviation and help prevent the relapse of  mood episodes. Description: Discuss and resolve troubling personal and interpersonal issues. Target Date: 2022-01-07 Frequency: Daily Modality: individual Progress: 80%  Client Response full compliance  [address] [address] Service Code cpt [phone] regulation skills  Related past to present  Rationally challenge thoughts or beliefs/cognitive restructuring  Identify/label emotions  Validate/empathize  Self care activities  Lifestyle change (exercise, nutrition)  Self-monitoring  Facilitate problem solving  Session notes:  f31.9 Bipolar Unspecified.  Goals: Mood stabilization, improved marital relationship, improve self esteem and elimination of self harm thoughts/tendencies. Also, patient struggles with leaving home comfortably and seeks to improve this ability. Target date for these goals is January, 2023. In addition, she needs to improve self care behaviors due to her weight and various medical conditions. Target date for this goal is Feb., 2022. Date revised to December, 2023 to allow time for behavioral implementation. Reports periodic outbursts of rage/anger. Seeks to minimize. Target date is December, 2023. Patient has realized improvement in all areas. Requests additional treatment to facilitate maintenance of progress. Goal date 12-23  Meds: Vyralar (6mg),N65mexta (40+20mg/da23mAdderall (20mg 4X/91m, Xanax (1mg, 3X/d47m, and Klonopin (1mg 3X/day88m Patient has requested a video session. She is at home and I am in my home office.  [name] had a good week visiting her parents while her uncle and aunt were visiting. She also saw brother for first time in many years. She reminisced about some of the rejection she experienced as a child. Feels she has mostly recovered, but still can feel the pain at times. Discussed the impact on how she currently regards her family and her met and unmet expectations. [name]  mentioned that [employer]  if off work the next two weeks, but they do not have any particular plans. Expressed frustration at [employer] unwillingness to initiate any plans and make use of the time together. Suggested she take the initiative for plans and to have a discussion to express her feelings.                                                                                                                    Jessica Morel, PhD  Time:2:15p-3:00p 45 minutes.

## 2021-08-15 ENCOUNTER — Ambulatory Visit (INDEPENDENT_AMBULATORY_CARE_PROVIDER_SITE_OTHER): Payer: Medicare Other | Admitting: Psychology

## 2021-08-15 DIAGNOSIS — F319 Bipolar disorder, unspecified: Secondary | ICD-10-CM

## 2021-08-15 NOTE — Progress Notes (Signed)
[name] is a 51 y.o. female patient   Treatment Plan: Date: 08/15/2021  Diagnosis 296.80 (Unspecified bipolar or related disorder) [n/a]  Symptoms Depressed or irritable mood. (Status: maintained) -- No Description Entered  Diminished interest in or enjoyment of activities. (Status: maintained) -- No Description Entered  Lack of energy. (Status: maintained) -- No Description Entered  Low self-esteem. (Status: maintained) -- No Description Entered  Medication Status compliance  Safety none  If Suicidal or Homicidal State Action Taken: unspecified  Current Risk: low Medications Adderall (Dosage: '20mg'$  4X/day)  Klonapin (Dosage: '1mg'$  3X/day)  Lamictal (Dosage: '200mg'$  2x/day)  Nuedexta (Dosage: '20mg'$ )  Vyralar (Dosage: '6mg'$ )  Xanax (Dosage: '1mg'$  3X/day)  Objectives Related Problem: Develop healthy cognitive patterns and beliefs about self and the world that lead to alleviation and help prevent the relapse of mood episodes. Description: State no longer having thoughts of self-harm. Target Date: 2022-01-07 Frequency: Daily Modality: individual Progress: 80%  Related Problem: Develop healthy cognitive patterns and beliefs about self and the world that lead to alleviation and help prevent the relapse of mood episodes. Description: Verbalize any history of past and present suicidal thoughts and actions. Target Date: 2022-01-07 Frequency: Daily Modality: individual Progress: 80%  Related Problem: Develop healthy cognitive patterns and beliefs about self and the world that lead to alleviation and help prevent the relapse of mood episodes. Description: Take prescribed medications as directed. Target Date: 2022-01-07 Frequency: Daily Modality: individual Progress: 90%  Related Problem: Develop healthy cognitive patterns and beliefs about self and the world that lead to alleviation and  help prevent the relapse of mood episodes. Description: Discuss and resolve troubling personal and interpersonal issues. Target Date: 2022-01-07 Frequency: Daily Modality: individual Progress: 80%  Client Response full compliance  Service Location Location, [name] [place], [place] [address] -virtual Service Code cpt [phone]  Emotion regulation skills  Related past to present  Rationally challenge thoughts or beliefs/cognitive restructuring  Identify/label emotions  Validate/empathize  Self care activities  Lifestyle change (exercise, nutrition)  Self-monitoring  Facilitate problem solving  Session notes:  f31.9 Bipolar Unspecified.  Goals: Mood stabilization, improved marital relationship, improve self esteem and elimination of self harm thoughts/tendencies. Also, patient struggles with leaving home comfortably and seeks to improve this ability. Target date for these goals is January, 2023. In addition, she needs to improve self care behaviors due to her weight and various medical conditions. Target date for this goal is Feb., 2022. Date revised to December, 2023 to allow time for behavioral implementation. Reports periodic outbursts of rage/anger. Seeks to minimize. Target date is December, 2023. Patient has realized improvement in all areas. Requests additional treatment to facilitate maintenance of progress. Goal date 12-23  Meds: Vyralar ('6mg'$ ),Nuedexta (40+'20mg'$ /day), Adderall ('20mg'$  4X/day), Xanax ('1mg'$ , 3X/day), and Klonopin ('1mg'$  3X/day).  Patient has requested a video session. She is at home and I am in my home office.  [name] says that she has just changed insurance to [name]. [name]'s uncle wote them a large check because [name] lost his job. They are struggling with decision of whether to cash the check. She reports that she is feeling stable. Expresses her ongoing concern about her parents. Self care continues to be a problem for [name].  She says she is lazy and has no  motivation. Talked about mind body connection and the benefits of physical self care. She will consider a walk outside during the week.                                                                                                                         Jessica Morel, PhD  Time:2:15p-3:00p 45 minutes.

## 2021-08-22 ENCOUNTER — Ambulatory Visit (INDEPENDENT_AMBULATORY_CARE_PROVIDER_SITE_OTHER): Payer: Medicare Other | Admitting: Psychology

## 2021-08-22 DIAGNOSIS — F319 Bipolar disorder, unspecified: Secondary | ICD-10-CM | POA: Diagnosis not present

## 2021-08-22 NOTE — Progress Notes (Signed)
LING FLESCH is a 51 y.o. female patient   Treatment Plan: Date: 08/22/2021  Diagnosis 296.80 (Unspecified bipolar or related disorder) [n/a]  Symptoms Depressed or irritable mood. (Status: maintained) -- No Description Entered  Diminished interest in or enjoyment of activities. (Status: maintained) -- No Description Entered  Lack of energy. (Status: maintained) -- No Description Entered  Low self-esteem. (Status: maintained) -- No Description Entered  Medication Status compliance  Safety none  If Suicidal or Homicidal State Action Taken: unspecified  Current Risk: low Medications Adderall (Dosage: 10m 4X/day)  Klonapin (Dosage: 125m3X/day)  Lamictal (Dosage: 20062mx/day)  Nuedexta (Dosage: 1m33mVyralar (Dosage: 6mg)59manax (Dosage: 1mg 36may)  Objectives Related Problem: Develop healthy cognitive patterns and beliefs about self and the world that lead to alleviation and help prevent the relapse of mood episodes. Description: State no longer having thoughts of self-harm. Target Date: 2022-01-07 Frequency: Daily Modality: individual Progress: 80%  Related Problem: Develop healthy cognitive patterns and beliefs about self and the world that lead to alleviation and help prevent the relapse of mood episodes. Description: Verbalize any history of past and present suicidal thoughts and actions. Target Date: 2022-01-07 Frequency: Daily Modality: individual Progress: 80%  Related Problem: Develop healthy cognitive patterns and beliefs about self and the world that lead to alleviation and help prevent the relapse of mood episodes. Description: Take prescribed medications as directed. Target Date: 2022-01-07 Frequency: Daily Modality: individual Progress: 90%  Related Problem: Develop healthy cognitive patterns and beliefs about self and the  world that lead to alleviation and help prevent the relapse of mood episodes. Description: Discuss and resolve troubling personal and interpersonal issues. Target Date: 2022-01-07 Frequency: Daily Modality: individual Progress: 80%  Client Response full compliance  Service [place] [address] [address] Service Code cpt [id number] regulation skills  Related past to present  Rationally challenge thoughts or beliefs/cognitive restructuring  Identify/label emotions  Validate/empathize  Self care activities  Lifestyle change (exercise, nutrition)  Self-monitoring  Facilitate problem solving  Session notes:  f31.9 Bipolar Unspecified.  Goals: Mood stabilization, improved marital relationship, improve self esteem and elimination of self harm thoughts/tendencies. Also, patient struggles with leaving home comfortably and seeks to improve this ability. Target date for these goals is January, 2023. In addition, she needs to improve self care behaviors due to her weight and various medical conditions. Target date for this goal is Feb., 2022. Date revised to December, 2023 to allow time for behavioral implementation. Reports periodic outbursts of rage/anger. Seeks to minimize. Target date is December, 2023. Patient has realized improvement in all areas. Requests additional treatment to facilitate maintenance of progress. Goal date 12-23  Meds: Vyralar (6mg),N81mexta (40+1mg/da53mAdderall (1mg 4X/52m, Xanax (1mg, 3X/d78m, and Klonopin (1mg 3X/day41m Patient has requested a video session. She is at home and I am in my home office.  Ranyia says [name] is bacMilbert Coulter work. She likes having him around the house but is relieved that he is working. She says she has been organizing things at home and she is shopping to get a more extensive drum kit.  She shops from home and is rarely goes out these days. She says she is content staying in most of the time, but  would like to get out with [name] to do something. She feels [name] has no interest in going out for an evening. She has very little to say today and says she is feeling reasonably stable with minimal anxiety or depression.                                                                                                                          [name], PhD  Time:2:15p-3:00p 45 minutes.

## 2021-08-29 ENCOUNTER — Ambulatory Visit (INDEPENDENT_AMBULATORY_CARE_PROVIDER_SITE_OTHER): Payer: Medicare PPO | Admitting: Psychology

## 2021-08-29 DIAGNOSIS — F319 Bipolar disorder, unspecified: Secondary | ICD-10-CM | POA: Diagnosis not present

## 2021-08-29 NOTE — Progress Notes (Signed)
                     [name] is a 50 y.o. female patient   Treatment Plan: Date: 08/29/2021  Diagnosis 296.80 (Unspecified bipolar or related disorder) [n/a]  Symptoms Depressed or irritable mood. (Status: maintained) -- No Description Entered  Diminished interest in or enjoyment of activities. (Status: maintained) -- No Description Entered  Lack of energy. (Status: maintained) -- No Description Entered  Low self-esteem. (Status: maintained) -- No Description Entered  Medication Status compliance  Safety none  If Suicidal or Homicidal State Action Taken: unspecified  Current Risk: low Medications Adderall (Dosage: 20mg 4X/day)  Klonapin (Dosage: 1mg 3X/day)  Lamictal (Dosage: 200mg 2x/day)  Nuedexta (Dosage: 20mg)  Vyralar (Dosage: 6mg)  Xanax (Dosage: 1mg 3X/day)  Objectives Related Problem: Develop healthy cognitive patterns and beliefs about self and the world that lead to alleviation and help prevent the relapse of mood episodes. Description: State no longer having thoughts of self-harm. Target Date: 2022-01-07 Frequency: Daily Modality: individual Progress: 80%  Related Problem: Develop healthy cognitive patterns and beliefs about self and the world that lead to alleviation and help prevent the relapse of mood episodes. Description: Verbalize any history of past and present suicidal thoughts and actions. Target Date: 2022-01-07 Frequency: Daily Modality: individual Progress: 80%  Related Problem: Develop healthy cognitive patterns and beliefs about self and the world that lead to alleviation and help prevent the relapse of mood episodes. Description: Take prescribed medications as directed. Target Date: 2022-01-07 Frequency: Daily Modality: individual Progress: 90%  Related Problem: Develop healthy cognitive patterns and beliefs about self and the world that lead to alleviation and help prevent the relapse of mood  episodes. Description: Discuss and resolve troubling personal and interpersonal issues. Target Date: 2022-01-07 Frequency: Daily Modality: individual Progress: 80%  Client Response full compliance  Service Location Location, [address] [place], [place] [address] -virtual Service Code cpt 90834P  Emotion regulation skills  Related past to present  Rationally challenge thoughts or beliefs/cognitive restructuring  Identify/label emotions  Validate/empathize  Self care activities  Lifestyle change (exercise, nutrition)  Self-monitoring  Facilitate problem solving  Session notes:  f31.9 Bipolar Unspecified.  Goals: Mood stabilization, improved marital relationship, improve self esteem and elimination of self harm thoughts/tendencies. Also, patient struggles with leaving home comfortably and seeks to improve this ability. Target date for these goals is January, 2023. In addition, she needs to improve self care behaviors due to her weight and various medical conditions. Target date for this goal is Feb., 2022. Date revised to December, 2023 to allow time for behavioral implementation. Reports periodic outbursts of rage/anger. Seeks to minimize. Target date is December, 2023. Patient has realized improvement in all areas. Requests additional treatment to facilitate maintenance of progress. Goal date 12-23  Meds: Vyralar (6mg),Nuedexta (40+20mg/day), Adderall (20mg 4X/day), Xanax (1mg, 3X/day), and Klonopin (1mg 3X/day).  Patient has requested a video session. She is at home and I am in my home office.  [name] says [name] has been bothering her lately. She says he bullies and teases her in ways that she does not like. She claims she has told him that she does not like his responses that poke fun at her, but he does not respond positively. She said she also asked [name] about purchasing a drum kit and he said "no" because she is not taking lessons. We discussed having a discussion with [name]. She  fears it will be a heated   exchange. Told her how to keep it productive and avoid conflict while expressing her feelings. She will try to talk with him tonight. She tends to become enraged easily and attacks verbally or withdraws.                                                                                                                                                                               Jessica Morel, PhD  Time:11:40a-12:30p 50 minutes.

## 2021-09-05 ENCOUNTER — Ambulatory Visit (INDEPENDENT_AMBULATORY_CARE_PROVIDER_SITE_OTHER): Payer: Medicare PPO | Admitting: Psychology

## 2021-09-05 DIAGNOSIS — F319 Bipolar disorder, unspecified: Secondary | ICD-10-CM

## 2021-09-05 NOTE — Progress Notes (Signed)
[name] is a 51 y.o. female patient   Treatment Plan: Date: 09/05/2021  Diagnosis 296.80 (Unspecified bipolar or related disorder) [n/a]  Symptoms Depressed or irritable mood. (Status: maintained) -- No Description Entered  Diminished interest in or enjoyment of activities. (Status: maintained) -- No Description Entered  Lack of energy. (Status: maintained) -- No Description Entered  Low self-esteem. (Status: maintained) -- No Description Entered  Medication Status compliance  Safety none  If Suicidal or Homicidal State Action Taken: unspecified  Current Risk: low Medications Adderall (Dosage: 45m 4X/day)  Klonapin (Dosage: 130m3X/day)  Lamictal (Dosage: 20065mx/day)  Nuedexta (Dosage: 73m78mVyralar (Dosage: 6mg)69manax (Dosage: 1mg 359may)  Objectives Related Problem: Develop healthy cognitive patterns and beliefs about self and the world that lead to alleviation and help prevent the relapse of mood episodes. Description: State no longer having thoughts of self-harm. Target Date: 2022-01-07 Frequency: Daily Modality: individual Progress: 80%  Related Problem: Develop healthy cognitive patterns and beliefs about self and the world that lead to alleviation and help prevent the relapse of mood episodes. Description: Verbalize any history of past and present suicidal thoughts and actions. Target Date: 2022-01-07 Frequency: Daily Modality: individual Progress: 80%  Related Problem: Develop healthy cognitive patterns and beliefs about self and the world that lead to alleviation and help prevent the relapse of mood episodes. Description: Take prescribed medications as directed. Target Date: 2022-01-07 Frequency: Daily Modality: individual Progress: 90%  Related Problem: Develop healthy cognitive patterns and beliefs about self and the world that lead to alleviation and help prevent the  relapse of mood episodes. Description: Discuss and resolve troubling personal and interpersonal issues. Target Date: 2022-01-07 Frequency: Daily Modality: individual Progress: 80%  Client Response full compliance  Service Location [address], [address] [address] Service Code cpt [phone] regulation skills  Related past to present  Rationally challenge thoughts or beliefs/cognitive restructuring  Identify/label emotions  Validate/empathize  Self care activities  Lifestyle change (exercise, nutrition)  Self-monitoring  Facilitate problem solving  Session notes:  f31.9 Bipolar Unspecified.  Goals: Mood stabilization, improved marital relationship, improve self esteem and elimination of self harm thoughts/tendencies. Also, patient struggles with leaving home comfortably and seeks to improve this ability. Target date for these goals is January, 2023. In addition, she needs to improve self care behaviors due to her weight and various medical conditions. Target date for this goal is Feb., 2022. Date revised to December, 2023 to allow time for behavioral implementation. Reports periodic outbursts of rage/anger. Seeks to minimize. Target date is December, 2023. Patient has realized improvement in all areas. Requests additional treatment to facilitate maintenance of progress. Goal date 12-23  Meds: Vyralar (6mg),N51mexta (40+73mg/da58mAdderall (73mg 4X/38m, Xanax (1mg, 3X/d56m, and Klonopin (1mg 3X/day80m Patient has requested a video session. She is at home and I am in my home office.  [name] says that tomorrow is her mother's birthday (83rd) and she will go visit with her. [place] the [place]. Dr. Kaur took [place] the Klonopin. She told her that it had to do with federal regulations. She is going to gradually cut back with her remaining supply. [name] remains angry at [employer]  for denying her a  purchase of a drum kit. He does not believe she will follow through with using it. She says she is giving up. We talked about alternative responses. She will consider, but is angry at this point. [name] got a call from [employer] and they want him to come back to work for them. He will go for the interview.                                                                                                                                                                                       [name], PhD  Time:2:15p-3:00p 45 minutes.

## 2021-09-12 ENCOUNTER — Ambulatory Visit (INDEPENDENT_AMBULATORY_CARE_PROVIDER_SITE_OTHER): Payer: Medicare PPO | Admitting: Psychology

## 2021-09-12 DIAGNOSIS — F319 Bipolar disorder, unspecified: Secondary | ICD-10-CM | POA: Diagnosis not present
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
## 2021-09-19 ENCOUNTER — Ambulatory Visit (INDEPENDENT_AMBULATORY_CARE_PROVIDER_SITE_OTHER): Payer: Medicare PPO | Admitting: Psychology

## 2021-09-19 DIAGNOSIS — F319 Bipolar disorder, unspecified: Secondary | ICD-10-CM

## 2021-09-19 NOTE — Progress Notes (Signed)
[name] is a 51 y.o. female patient   Treatment Plan: Date: 09/19/2021  Diagnosis 296.80 (Unspecified bipolar or related disorder) [n/a]  Symptoms Depressed or irritable mood. (Status: maintained) -- No Description Entered  Diminished interest in or enjoyment of activities. (Status: maintained) -- No Description Entered  Lack of energy. (Status: maintained) -- No Description Entered  Low self-esteem. (Status: maintained) -- No Description Entered  Medication Status compliance  Safety none  If Suicidal or Homicidal State Action Taken: unspecified  Current Risk: low Medications Adderall (Dosage: '20mg'$  4X/day)  Klonapin (Dosage: '1mg'$  3X/day)  Lamictal (Dosage: '200mg'$  2x/day)  Nuedexta (Dosage: '20mg'$ )  Vyralar (Dosage: '6mg'$ )  Xanax (Dosage: '1mg'$  3X/day)  Objectives Related Problem: Develop healthy cognitive patterns and beliefs about self and the world that lead to alleviation and help prevent the relapse of mood episodes. Description: State no longer having thoughts of self-harm. Target Date: 2022-01-07 Frequency: Daily Modality: individual Progress: 80%  Related Problem: Develop healthy cognitive patterns and beliefs about self and the world that lead to alleviation and help prevent the relapse of mood episodes. Description: Verbalize any history of past and present suicidal thoughts and actions. Target Date: 2022-01-07 Frequency: Daily Modality: individual Progress: 80%  Related Problem: Develop healthy cognitive patterns and beliefs about self and the world that lead to alleviation and help prevent the relapse of mood episodes. Description: Take prescribed medications as directed. Target Date: 2022-01-07 Frequency: Daily Modality: individual Progress: 90%  Related Problem: Develop healthy cognitive patterns and beliefs about self and the world that lead to alleviation and help prevent the relapse of mood  episodes. Description: Discuss and resolve troubling personal and interpersonal issues. Target Date: 2022-01-07 Frequency: Daily Modality: individual Progress: 80%  Client Response full compliance  Service Location Location, [name] [place], [place] [address] -virtual Service Code cpt [phone]  Emotion regulation skills  Related past to present  Rationally challenge thoughts or beliefs/cognitive restructuring  Identify/label emotions  Validate/empathize  Self care activities  Lifestyle change (exercise, nutrition)  Self-monitoring  Facilitate problem solving  Session notes:  f31.9 Bipolar Unspecified.  Goals: Mood stabilization, improved marital relationship, improve self esteem and elimination of self harm thoughts/tendencies. Also, patient struggles with leaving home comfortably and seeks to improve this ability. Target date for these goals is January, 2023. In addition, she needs to improve self care behaviors due to her weight and various medical conditions. Target date for this goal is Feb., 2022. Date revised to December, 2023 to allow time for behavioral implementation. Reports periodic outbursts of rage/anger. Seeks to minimize. Target date is December, 2023. Patient has realized improvement in all areas. Requests additional treatment to facilitate maintenance of progress. Goal date 12-23  Meds: Vyralar ('6mg'$ ),Nuedexta (40+'20mg'$ /day), Adderall ('20mg'$  4X/day), Xanax ('1mg'$ , 3X/day), and Klonopin ('1mg'$  3X/day).  Patient has requested a video session. She is at home and I am in my home office.  [name] says [name] is sick but hasn't gone to doctor. He did not get the [employer] job and they are both surprised. She talked about her parents and her desire to "protect them from feeling bad or nervous anout their medical conditions. She reports feeling mostly stable with minimal agitation. She has not been very active socially lately, but is talking with friends by phone. [name] does not report  any significant depressive symptoms or interpersonal conflicts.      [name]  [name], PhD  Time:2:15p-3:00p 45 minutes.                           n

## 2021-09-26 ENCOUNTER — Ambulatory Visit (INDEPENDENT_AMBULATORY_CARE_PROVIDER_SITE_OTHER): Payer: Medicare PPO | Admitting: Psychology

## 2021-09-26 DIAGNOSIS — F319 Bipolar disorder, unspecified: Secondary | ICD-10-CM

## 2021-09-26 NOTE — Progress Notes (Signed)
[name] is a 51 y.o. female patient   Treatment Plan: Date: 09/26/2021  Diagnosis 296.80 (Unspecified bipolar or related disorder) [n/a]  Symptoms Depressed or irritable mood. (Status: maintained) -- No Description Entered  Diminished interest in or enjoyment of activities. (Status: maintained) -- No Description Entered  Lack of energy. (Status: maintained) -- No Description Entered  Low self-esteem. (Status: maintained) -- No Description Entered  Medication Status compliance  Safety none  If Suicidal or Homicidal State Action Taken: unspecified  Current Risk: low Medications Adderall (Dosage: '20mg'$  4X/day)  Klonapin (Dosage: '1mg'$  3X/day)  Lamictal (Dosage: '200mg'$  2x/day)  Nuedexta (Dosage: '20mg'$ )  Vyralar (Dosage: '6mg'$ )  Xanax (Dosage: '1mg'$  3X/day)  Objectives Related Problem: Develop healthy cognitive patterns and beliefs about self and the world that lead to alleviation and help prevent the relapse of mood episodes. Description: State no longer having thoughts of self-harm. Target Date: 2022-01-07 Frequency: Daily Modality: individual Progress: 80%  Related Problem: Develop healthy cognitive patterns and beliefs about self and the world that lead to alleviation and help prevent the relapse of mood episodes. Description: Verbalize any history of past and present suicidal thoughts and actions. Target Date: 2022-01-07 Frequency: Daily Modality: individual Progress: 80%  Related Problem: Develop healthy cognitive patterns and beliefs about self and the world that lead to alleviation and help prevent the relapse of mood episodes. Description: Take prescribed medications as directed. Target Date: 2022-01-07 Frequency: Daily Modality: individual Progress: 90%  Related Problem: Develop healthy cognitive patterns and beliefs about self and the world that lead to alleviation and help  prevent the relapse of mood episodes. Description: Discuss and resolve troubling personal and interpersonal issues. Target Date: 2022-01-07 Frequency: Daily Modality: individual Progress: 80%  Client Response full compliance  Service Location Location, [name] [place], [place] [address] -virtual Service Code cpt [phone]  Emotion regulation skills  Related past to present  Rationally challenge thoughts or beliefs/cognitive restructuring  Identify/label emotions  Validate/empathize  Self care activities  Lifestyle change (exercise, nutrition)  Self-monitoring  Facilitate problem solving  Session notes:  f31.9 Bipolar Unspecified.  Goals: Mood stabilization, improved marital relationship, improve self esteem and elimination of self harm thoughts/tendencies. Also, patient struggles with leaving home comfortably and seeks to improve this ability. Target date for these goals is January, 2023. In addition, she needs to improve self care behaviors due to her weight and various medical conditions. Target date for this goal is Feb., 2022. Date revised to December, 2023 to allow time for behavioral implementation. Reports periodic outbursts of rage/anger. Seeks to minimize. Target date is December, 2023. Patient has realized improvement in all areas. Requests additional treatment to facilitate maintenance of progress. Goal date 12-23  Meds: Vyralar ('6mg'$ ),Nuedexta (40+'20mg'$ /day), Adderall ('20mg'$  4X/day), Xanax ('1mg'$ , 3X/day), and Klonopin ('1mg'$  3X/day).  Patient has requested a video session. She is at home and I am in my home office.  [name] called me on Sunday. She said [name] got very sick over the weekend. She had called me and I told her to take him to ER. She feared having a panic attack, but took him anyway. Called me later that day to tell me she had a panic and had to leave. [name] stayed and later called to tell her that  the doctor told him he had Lymphoma. She called to tell me she was  struggling and that her brother was coming to the house to be with her. [name] was to stay in ER until Monday morning to be transferred to [facility]. Today: [name] is at [facility] and is having a biopsy of Lymph Nodes. Oncologist is coming tomorrow to meet with them. [name] feels strongly that he has Lymphoma. 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 She has seen him each day and will go after session today. Is relatively calm and not having panic attacks.          [name]                                                                                                                                                                                        [name], PhD  Time:2:15p-3:00p 45 minutes.                           n

## 2021-10-03 ENCOUNTER — Ambulatory Visit (INDEPENDENT_AMBULATORY_CARE_PROVIDER_SITE_OTHER): Payer: Medicare PPO | Admitting: Psychology

## 2021-10-03 DIAGNOSIS — F319 Bipolar disorder, unspecified: Secondary | ICD-10-CM

## 2021-10-04 NOTE — Progress Notes (Addendum)
[name] is a 51 y.o. female patient   Treatment Plan:  Diagnosis 296.80 (Unspecified bipolar or related disorder) [n/a]  Symptoms Depressed or irritable mood. (Status: maintained) -- No Description Entered  Diminished interest in or enjoyment of activities. (Status: maintained) -- No Description Entered  Lack of energy. (Status: maintained) -- No Description Entered  Low self-esteem. (Status: maintained) -- No Description Entered  Medication Status compliance  Safety none  If Suicidal or Homicidal State Action Taken: unspecified  Current Risk: low Medications Adderall (Dosage: '20mg'$  4X/day)  Klonapin (Dosage: '1mg'$  3X/day)  Lamictal (Dosage: '200mg'$  2x/day)  Nuedexta (Dosage: '20mg'$ )  Vyralar (Dosage: '6mg'$ )  Xanax (Dosage: '1mg'$  3X/day)  Objectives Related Problem: Develop healthy cognitive patterns and beliefs about self and the world that lead to alleviation and help prevent the relapse of mood episodes. Description: State no longer having thoughts of self-harm. Target Date: 2022-01-07 Frequency: Daily Modality: individual Progress: 80%  Related Problem: Develop healthy cognitive patterns and beliefs about self and the world that lead to alleviation and help prevent the relapse of mood episodes. Description: Verbalize any history of past and present suicidal thoughts and actions. Target Date: 2022-01-07 Frequency: Daily Modality: individual Progress: 80%  Related Problem: Develop healthy cognitive patterns and beliefs about self and the world that lead to alleviation and help prevent the relapse of mood episodes. Description: Take prescribed medications as directed. Target Date: 2022-01-07 Frequency: Daily Modality: individual Progress: 90%  Related Problem: Develop healthy cognitive patterns and beliefs about self and the world that lead to alleviation and help prevent the relapse of mood episodes. Description: Discuss and resolve troubling personal and  interpersonal issues. Target Date: 2022-01-07 Frequency: Daily Modality: individual Progress: 80%  Client Response full compliance  Service Location Location, [name] [place], [place] [address] -virtual Service Code cpt [phone]  Emotion regulation skills  Related past to present  Rationally challenge thoughts or beliefs/cognitive restructuring  Identify/label emotions  Validate/empathize  Self care activities  Lifestyle change (exercise, nutrition)  Self-monitoring  Facilitate problem solving  Session notes:  f31.9 Bipolar Unspecified.  Goals: Mood stabilization, improved marital relationship, improve self esteem and elimination of self harm thoughts/tendencies. Also, patient struggles with leaving home comfortably and seeks to improve this ability. Target date for these goals is January, 2023. In addition, she needs to improve self care behaviors due to her weight and various medical conditions. Target date for this goal is Feb., 2022. Date revised to December, 2023 to allow time for behavioral implementation. Reports periodic outbursts of rage/anger. Seeks to minimize. Target date is December, 2023. Patient has realized improvement in all areas. Requests additional treatment to facilitate maintenance of progress. Goal date 12-23  Meds: Vyralar ('6mg'$ ),Nuedexta (40+'20mg'$ /day), Adderall ('20mg'$  4X/day), Xanax ('1mg'$ , 3X/day), and Klonopin ('1mg'$  3X/day).  Patient has requested a video session. She is at home and I am in my home office.  [name] took [name] to [facility] and he was immediately admitted to the hospital. She says he is very sick and disoriented. He was ill all weekend and she is distressed that they did not keep him in the hospital last week. The doctor told then that he has cancer, but they need to do further evaluations to determine the type. He first needs to be stabilized. [name] is very anxious and has a hard time being at the hospital. Family/friends have been very  supportive of her and [name]. She fears losing [name], but right now is mostly focused on getting more information and a  treatment plan. We talked about the need for her to stay actively involved, but also let others in to help. So far, she has been able to take care of herself and has managed the anxiety with a few minor exceptions. I have offered to be available by phone or text at any time she needs to touch base during an anxiety episode.             [name]                                                                                                                                                                                        Jessica Morel, PhD  Time:5:05p-6:00p 55 minutes.                           n

## 2021-10-10 ENCOUNTER — Ambulatory Visit (INDEPENDENT_AMBULATORY_CARE_PROVIDER_SITE_OTHER): Payer: Medicare PPO | Admitting: Psychology

## 2021-10-10 DIAGNOSIS — F319 Bipolar disorder, unspecified: Secondary | ICD-10-CM | POA: Diagnosis not present

## 2021-10-10 NOTE — Progress Notes (Signed)
[name] is a 51 y.o. female patient   Treatment Plan: Date: 10/10/2021  Diagnosis 296.80 (Unspecified bipolar or related disorder) [n/a]  Symptoms Depressed or irritable mood. (Status: maintained) -- No Description Entered  Diminished interest in or enjoyment of activities. (Status: maintained) -- No Description Entered  Lack of energy. (Status: maintained) -- No Description Entered  Low self-esteem. (Status: maintained) -- No Description Entered  Medication Status compliance  Safety none  If Suicidal or Homicidal State Action Taken: unspecified  Current Risk: low Medications Adderall (Dosage: '20mg'$  4X/day)  Klonapin (Dosage: '1mg'$  3X/day)  Lamictal (Dosage: '200mg'$  2x/day)  Nuedexta (Dosage: '20mg'$ )  Vyralar (Dosage: '6mg'$ )  Xanax (Dosage: '1mg'$  3X/day)  Objectives Related Problem: Develop healthy cognitive patterns and beliefs about self and the world that lead to alleviation and help prevent the relapse of mood episodes. Description: State no longer having thoughts of self-harm. Target Date: 2022-01-07 Frequency: Daily Modality: individual Progress: 80%  Related Problem: Develop healthy cognitive patterns and beliefs about self and the world that lead to alleviation and help prevent the relapse of mood episodes. Description: Verbalize any history of past and present suicidal thoughts and actions. Target Date: 2022-01-07 Frequency: Daily Modality: individual Progress: 80%  Related Problem: Develop healthy cognitive patterns and beliefs about self and the world that lead to alleviation and help prevent the relapse of mood episodes. Description: Take prescribed medications as directed. Target Date: 2022-01-07 Frequency: Daily Modality: individual Progress: 90%  Related Problem: Develop healthy cognitive patterns and beliefs about self and the world that lead to alleviation and help prevent the relapse of mood episodes. Description: Discuss and resolve troubling  personal and interpersonal issues. Target Date: 2022-01-07 Frequency: Daily Modality: individual Progress: 80%  Client Response full compliance  Service Location Location, [name] [place], [place] [address] -virtual Service Code cpt [phone]  Emotion regulation skills  Related past to present  Rationally challenge thoughts or beliefs/cognitive restructuring  Identify/label emotions  Validate/empathize  Self care activities  Lifestyle change (exercise, nutrition)  Self-monitoring  Facilitate problem solving  Session notes:  f31.9 Bipolar Unspecified.  Goals: Mood stabilization, improved marital relationship, improve self esteem and elimination of self harm thoughts/tendencies. Also, patient struggles with leaving home comfortably and seeks to improve this ability. Target date for these goals is January, 2023. In addition, she needs to improve self care behaviors due to her weight and various medical conditions. Target date for this goal is Feb., 2022. Date revised to December, 2023 to allow time for behavioral implementation. Reports periodic outbursts of rage/anger. Seeks to minimize. Target date is December, 2023. Patient has realized improvement in all areas. Requests additional treatment to facilitate maintenance of progress. Goal date 12-23  Meds: Vyralar ('6mg'$ ),Nuedexta (40+'20mg'$ /day), Adderall ('20mg'$  4X/day), Xanax ('1mg'$ , 3X/day), and Klonopin ('1mg'$  3X/day).  Patient has requested a video session. She is at home and I am in my home office.  [name] contacted me last week to share that [name] passed away. Spoke with her dail until yesterday to check in and make sure she was managing okay. She has family and friends that have been supportive.  She states today that she is trying to go through the motions. She has hired a Chief Executive Officer to help her with any necessary paperwork. She said her father wants her to sell her house and move in with them. She says "no way". We talked about the need to  take her time and determine her next steps.                [name]  Jessica Morel, PhD  Time:2:05p-3:00p 55 minutes.                           n

## 2021-10-17 ENCOUNTER — Ambulatory Visit (INDEPENDENT_AMBULATORY_CARE_PROVIDER_SITE_OTHER): Payer: Medicare PPO | Admitting: Psychology

## 2021-10-17 DIAGNOSIS — F319 Bipolar disorder, unspecified: Secondary | ICD-10-CM | POA: Diagnosis not present

## 2021-10-17 NOTE — Progress Notes (Signed)
[name] is a 51 y.o. female patient   Treatment Plan: Date: 10/17/2021  Diagnosis 296.80 (Unspecified bipolar or related disorder) [n/a]  Symptoms Depressed or irritable mood. (Status: maintained) -- No Description Entered  Diminished interest in or enjoyment of activities. (Status: maintained) -- No Description Entered  Lack of energy. (Status: maintained) -- No Description Entered  Low self-esteem. (Status: maintained) -- No Description Entered  Medication Status compliance  Safety none  If Suicidal or Homicidal State Action Taken: unspecified  Current Risk: low Medications Adderall (Dosage: '20mg'$  4X/day)  Klonapin (Dosage: '1mg'$  3X/day)  Lamictal (Dosage: '200mg'$  2x/day)  Nuedexta (Dosage: '20mg'$ )  Vyralar (Dosage: '6mg'$ )  Xanax (Dosage: '1mg'$  3X/day)  Objectives Related Problem: Develop healthy cognitive patterns and beliefs about self and the world that lead to alleviation and help prevent the relapse of mood episodes. Description: State no longer having thoughts of self-harm. Target Date: 2022-01-07 Frequency: Daily Modality: individual Progress: 80%  Related Problem: Develop healthy cognitive patterns and beliefs about self and the world that lead to alleviation and help prevent the relapse of mood episodes. Description: Verbalize any history of past and present suicidal thoughts and actions. Target Date: 2022-01-07 Frequency: Daily Modality: individual Progress: 80%  Related Problem: Develop healthy cognitive patterns and beliefs about self and the world that lead to alleviation and help prevent the relapse of mood episodes. Description: Take prescribed medications as directed. Target Date: 2022-01-07 Frequency: Daily Modality: individual Progress: 90%  Related Problem: Develop healthy cognitive patterns and beliefs about self and the world that lead to alleviation and help prevent the relapse of mood episodes. Description:  Discuss and resolve troubling personal and interpersonal issues. Target Date: 2022-01-07 Frequency: Daily Modality: individual Progress: 80%  Client Response full compliance  Service Location Location, [name] [place], [place] [address] -virtual Service Code cpt [phone]  Emotion regulation skills  Related past to present  Rationally challenge thoughts or beliefs/cognitive restructuring  Identify/label emotions  Validate/empathize  Self care activities  Lifestyle change (exercise, nutrition)  Self-monitoring  Facilitate problem solving  Session notes:  f31.9 Bipolar Unspecified.  Goals: Mood stabilization, improved marital relationship, improve self esteem and elimination of self harm thoughts/tendencies. Also, patient struggles with leaving home comfortably and seeks to improve this ability. Target date for these goals is January, 2023. In addition, she needs to improve self care behaviors due to her weight and various medical conditions. Target date for this goal is Feb., 2022. Date revised to December, 2023 to allow time for behavioral implementation. Reports periodic outbursts of rage/anger. Seeks to minimize. Target date is December, 2023. Patient has realized improvement in all areas. Requests additional treatment to facilitate maintenance of progress. Goal date 12-23  Meds: Vyralar ('6mg'$ ),Nuedexta (40+'20mg'$ /day), Adderall ('20mg'$  4X/day), Xanax ('1mg'$ , 3X/day), and Klonopin ('1mg'$  3X/day).  Patient has requested a video session. She is at home and I am in my home office.  [name] had the service for [place] on Sunday and it went well. She said a number of family from out of town came in as well as [name]'s co-workers from [employer]. We discussed getting through the next few weeks and managing grief.  She is doing reasonably well, considering the circumstances. Still in shock in many ways, but is preparing for an emotional flood. We talked about needing to avoid making any significant  decisions about changing circumstances for quite a while. Her father suggested she sell her  home and move in with them. She realizes that would not be a good decision. Suggested she journal thoughts/feelings that she will want to discuss. Will monmitor her grief moving forward.                    [name]                                                                                                                                                                                        [name], PhD  Time:2:05p-3:00p 55 minutes.                           n

## 2021-10-24 ENCOUNTER — Ambulatory Visit (INDEPENDENT_AMBULATORY_CARE_PROVIDER_SITE_OTHER): Payer: Medicare PPO | Admitting: Psychology

## 2021-10-24 DIAGNOSIS — F4321 Adjustment disorder with depressed mood: Secondary | ICD-10-CM | POA: Diagnosis not present

## 2021-10-24 NOTE — Progress Notes (Addendum)
[name] is a 51 y.o. female patient   Treatment Plan: Date: 10/24/2021  Diagnosis 296.80 (Unspecified bipolar or related disorder) [n/a]  Symptoms Depressed or irritable mood. (Status: maintained) -- No Description Entered  Diminished interest in or enjoyment of activities. (Status: maintained) -- No Description Entered  Lack of energy. (Status: maintained) -- No Description Entered  Low self-esteem. (Status: maintained) -- No Description Entered  Medication Status compliance  Safety none  If Suicidal or Homicidal State Action Taken: unspecified  Current Risk: low Medications Adderall (Dosage: '20mg'$  4X/day)  Klonapin (Dosage: '1mg'$  3X/day)  Lamictal (Dosage: '200mg'$  2x/day)  Nuedexta (Dosage: '20mg'$ )  Vyralar (Dosage: '6mg'$ )  Xanax (Dosage: '1mg'$  3X/day)  Objectives Related Problem: Develop healthy cognitive patterns and beliefs about self and the world that lead to alleviation and help prevent the relapse of mood episodes. Description: State no longer having thoughts of self-harm. Target Date: 2022-01-07 Frequency: Daily Modality: individual Progress: 80%  Related Problem: Develop healthy cognitive patterns and beliefs about self and the world that lead to alleviation and help prevent the relapse of mood episodes. Description: Verbalize any history of past and present suicidal thoughts and actions. Target Date: 2022-01-07 Frequency: Daily Modality: individual Progress: 80%  Related Problem: Develop healthy cognitive patterns and beliefs about self and the world that lead to alleviation and help prevent the relapse of mood episodes. Description: Take prescribed medications as directed. Target Date: 2022-01-07 Frequency: Daily Modality: individual Progress: 90%  Related Problem: Develop healthy cognitive patterns and beliefs about self and the world that lead to alleviation and help prevent the relapse of mood episodes. Description: Discuss and resolve troubling  personal and interpersonal issues. Target Date: 2022-01-07 Frequency: Daily Modality: individual Progress: 80%  Client Response full compliance  Service Location Location, [name] [place], [place] [address] -virtual Service Code cpt [phone]  Emotion regulation skills  Related past to present  Rationally challenge thoughts or beliefs/cognitive restructuring  Identify/label emotions  Validate/empathize  Self care activities  Lifestyle change (exercise, nutrition)  Self-monitoring  Facilitate problem solving  Session notes:  f31.9 Bipolar Unspecified.  Goals: Mood stabilization, improved marital relationship, improve self esteem and elimination of self harm thoughts/tendencies. Also, patient struggles with leaving home comfortably and seeks to improve this ability. Target date for these goals is January, 2023. In addition, she needs to improve self care behaviors due to her weight and various medical conditions. Target date for this goal is Feb., 2022. Date revised to December, 2023 to allow time for behavioral implementation. Reports periodic outbursts of rage/anger. Seeks to minimize. Target date is December, 2023. Patient has realized improvement in all areas. Requests additional treatment to facilitate maintenance of progress. Goal date 12-23  Meds: Vyralar ('6mg'$ ),Nuedexta (40+'20mg'$ /day), Adderall ('20mg'$  4X/day), Xanax ('1mg'$ , 3X/day), and Klonopin ('1mg'$  3X/day).  Patient has requested a video session. She is at home and I am in my home office.  [name] says this week has been "hell". She is overwhelmed with bills and managing day to day. I suggested that she meet with her sister in law to help her get organized and develop a strategy to manage all household tasks. We discussed the need for self care. She has no appetite and is not shopping for food. I suggested that she get involved in a grief group.She says she would consider going. She says that she had a hard time sleeping and was  focused on being alone and scared.   She agreed to meet with sister in law. She  also will explore grocery delivery options and grief support groups.                      [name], PhD  Time:2:15p-3:00p 45 minutes.                           n

## 2021-10-31 ENCOUNTER — Ambulatory Visit (INDEPENDENT_AMBULATORY_CARE_PROVIDER_SITE_OTHER): Payer: Medicare PPO | Admitting: Psychology

## 2021-10-31 DIAGNOSIS — F4321 Adjustment disorder with depressed mood: Secondary | ICD-10-CM

## 2021-10-31 NOTE — Progress Notes (Signed)
[name] is a 51 y.o. female patient   Treatment Plan: Date: 10/31/2021  Diagnosis 296.80 (Unspecified bipolar or related disorder) [n/a]  Symptoms Depressed or irritable mood. (Status: maintained) -- No Description Entered  Diminished interest in or enjoyment of activities. (Status: maintained) -- No Description Entered  Lack of energy. (Status: maintained) -- No Description Entered  Low self-esteem. (Status: maintained) -- No Description Entered  Medication Status compliance  Safety none  If Suicidal or Homicidal State Action Taken: unspecified  Current Risk: low Medications Adderall (Dosage: '20mg'$  4X/day)  Klonapin (Dosage: '1mg'$  3X/day)  Lamictal (Dosage: '200mg'$  2x/day)  Nuedexta (Dosage: '20mg'$ )  Vyralar (Dosage: '6mg'$ )  Xanax (Dosage: '1mg'$  3X/day)  Objectives Related Problem: Develop healthy cognitive patterns and beliefs about self and the world that lead to alleviation and help prevent the relapse of mood episodes. Description: State no longer having thoughts of self-harm. Target Date: 2022-01-07 Frequency: Daily Modality: individual Progress: 80%  Related Problem: Develop healthy cognitive patterns and beliefs about self and the world that lead to alleviation and help prevent the relapse of mood episodes. Description: Verbalize any history of past and present suicidal thoughts and actions. Target Date: 2022-01-07 Frequency: Daily Modality: individual Progress: 80%  Related Problem: Develop healthy cognitive patterns and beliefs about self and the world that lead to alleviation and help prevent the relapse of mood episodes. Description: Take prescribed medications as directed. Target Date: 2022-01-07 Frequency: Daily Modality: individual Progress: 90%  Related Problem: Develop healthy cognitive patterns and beliefs about self and the world that lead to alleviation and  help prevent the relapse of mood episodes. Description: Discuss and resolve troubling personal and interpersonal issues. Target Date: 2022-01-07 Frequency: Daily Modality: individual Progress: 80%  Client Response full compliance  Service Location Location, [name] [place], [place] [address] -virtual Service Code cpt [phone]  Emotion regulation skills  Related past to present  Rationally challenge thoughts or beliefs/cognitive restructuring  Identify/label emotions  Validate/empathize  Self care activities  Lifestyle change (exercise, nutrition)  Self-monitoring  Facilitate problem solving  Session notes:  f31.9 Bipolar Unspecified.  Goals: Mood stabilization, improved marital relationship, improve self esteem and elimination of self harm thoughts/tendencies. Also, patient struggles with leaving home comfortably and seeks to improve this ability. Target date for these goals is January, 2023. In addition, she needs to improve self care behaviors due to her weight and various medical conditions. Target date for this goal is Feb., 2022. Date revised to December, 2023 to allow time for behavioral implementation. Reports periodic outbursts of rage/anger. Seeks to minimize. Target date is December, 2023. Patient has realized improvement in all areas. Requests additional treatment to facilitate maintenance of progress. Goal date 12-23  Meds: Vyralar ('6mg'$ ),Nuedexta (40+'20mg'$ /day), Adderall ('20mg'$  4X/day), Xanax ('1mg'$ , 3X/day), and Klonopin ('1mg'$  3X/day).  Patient has requested a video session. She is at home and I am in my home office.  [name]'s friend Shirlean Mylar came and spent the weekend with her and cooked meals for [name] to freeze. It was a good visit and [name] was pleased to have company. She states that she and her friends are staying in touch and it keeps her from feeling alone. Her parent's anniversary is on Friday and she will go to  their home to be with them. She did explore grief  groups as we had discussed. She did not feel any of them applied to her situation.We looked up some resources today and [name] will contact them. Jericha expressed concerns that her insurance will not pay enough for her therapy. She wants to check to make sure she can afford treatment. I told her I will work with her to help make sure her services are not interrupted.  She says people are offering to help. I encouraged her to accept their offers. To date she has been reluctant, but says she will start accepting.                        [name]                                                                                                                                                                                        [name], PhD  Time:2:10p-3:00p 50 minutes.                           n

## 2021-11-07 ENCOUNTER — Ambulatory Visit (INDEPENDENT_AMBULATORY_CARE_PROVIDER_SITE_OTHER): Payer: Medicare PPO | Admitting: Psychology

## 2021-11-07 DIAGNOSIS — F4321 Adjustment disorder with depressed mood: Secondary | ICD-10-CM

## 2021-11-07 NOTE — Progress Notes (Signed)
[name] is a 51 y.o. female patient   Treatment Plan: Date: 11/07/2021  Diagnosis 296.80 (Unspecified bipolar or related disorder) [n/a]  Symptoms Depressed or irritable mood. (Status: maintained) -- No Description Entered  Diminished interest in or enjoyment of activities. (Status: maintained) -- No Description Entered  Lack of energy. (Status: maintained) -- No Description Entered  Low self-esteem. (Status: maintained) -- No Description Entered  Medication Status compliance  Safety none  If Suicidal or Homicidal State Action Taken: unspecified  Current Risk: low Medications Adderall (Dosage: '20mg'$  4X/day)  Klonapin (Dosage: '1mg'$  3X/day)  Lamictal (Dosage: '200mg'$  2x/day)  Nuedexta (Dosage: '20mg'$ )  Vyralar (Dosage: '6mg'$ )  Xanax (Dosage: '1mg'$  3X/day)  Objectives Related Problem: Develop healthy cognitive patterns and beliefs about self and the world that lead to alleviation and help prevent the relapse of mood episodes. Description: State no longer having thoughts of self-harm. Target Date: 2022-01-07 Frequency: Daily Modality: individual Progress: 80%  Related Problem: Develop healthy cognitive patterns and beliefs about self and the world that lead to alleviation and help prevent the relapse of mood episodes. Description: Verbalize any history of past and present suicidal thoughts and actions. Target Date: 2022-01-07 Frequency: Daily Modality: individual Progress: 80%  Related Problem: Develop healthy cognitive patterns and beliefs about self and the world that lead to alleviation and help prevent the relapse of mood episodes. Description: Take prescribed medications as directed. Target Date: 2022-01-07 Frequency: Daily Modality: individual Progress: 90%  Related Problem: Develop healthy cognitive patterns and beliefs about self and the world  that lead to alleviation and help prevent the relapse of mood episodes. Description: Discuss and resolve troubling personal and interpersonal issues. Target Date: 2022-01-07 Frequency: Daily Modality: individual Progress: 80%  Client Response full compliance  Service Location Location, [name] [place],  [id number] -virtual Service Code cpt [phone]  Emotion regulation skills  Related past to present  Rationally challenge thoughts or beliefs/cognitive restructuring  Identify/label emotions  Validate/empathize  Self care activities  Lifestyle change (exercise, nutrition)  Self-monitoring  Facilitate problem solving  Session notes:  f31.9 Bipolar Unspecified.  Goals: Mood stabilization, improved marital relationship, improve self esteem and elimination of self harm thoughts/tendencies. Also, patient struggles with leaving home comfortably and seeks to improve this ability. Target date for these goals is January, 2023. In addition, she needs to improve self care behaviors due to her weight and various medical conditions. Target date for this goal is Feb., 2022. Date revised to December, 2023 to allow time for behavioral implementation. Reports periodic outbursts of rage/anger. Seeks to minimize. Target date is December, 2023. Patient has realized improvement in all areas. Requests additional treatment to facilitate maintenance of progress. Goal date 01-31-2023. Patient's husband died unexpectedly and she is suffering complicated grief. She needs additional counseling to address associated issues and manage feelings of loss, despair and anxiety. Goal date 8-24.  Meds: Vyralar ('6mg'$ ),Nuedexta (40+'20mg'$ /day), Adderall ('20mg'$  4X/day), Xanax ('1mg'$ , 3X/day), and Klonopin ('1mg'$  3X/day).  Patient has requested a video session. She is at home and I am in my home office.  [name] continues to say she is "okay". She has been challenged with her new  insurance trying to navigate the new system.  Trouble figuring out how to efficiently get her medication. We talked about a strategy to resolve the problem. She went to her parents house twice this week. Once for their anniversary and the other visit was to sit with mother while father was out. Otherwise she has not left the house often. She has gone to friends for dinner. She is also handling some needed house maintenance. She says she is spending her time "doing chores" and calls people at night and talks until she goes to bed.  She found a grief group near her neighborhood. It meets on Monday night and she is considering going. She did not call any of the other resources I provided but says she will consider. She says she needs to find someone to go with her as she does not want to go alone.  The shorter days has been difficult and we talked about how to manage and stay busy.                           [name]                                                                                                                                                                                        [name], PhD  Time:2:10p-3:00p 50 minutes.                           n

## 2021-11-13 ENCOUNTER — Telehealth: Payer: Self-pay | Admitting: Family Medicine

## 2021-11-13 NOTE — Progress Notes (Signed)
ACUTE VISIT Chief Complaint  Patient presents with   Follow-up   HPI: [name] is a 51 y.o. female with medical history significant for DM 2, pulmonary hypertension, polycythemia vera, OSA, hyperlipidemia, hypertension, bipolar disorder, and asthma here today with her friend complaining of left heel pain, which has been ongoing for approximately four months. She has a history of a similar issue in the right heel, which was successfully treated with physical therapy and ultrasound transducer "to break up scar tissue." She reports a pain level of 4 or 5/10, particularly while walking, and describes the sensation as constant pressure and soreness without any numbness or tingling.  She has not noted edema or erythema. She is not currently taking any pain medication for this issue.  Last follow-up 03/2020. Regarding her DM II, she reports not regularly checking her blood sugars. She is taking metformin but not regularly, daily sometimes. Last refills for 60 tabs in 03/2021.She is not engaging in regular exercise due to the heel pain.  Lab Results  Component Value Date   HGBA1C 6.6 (H) 05/20/2020   Bipolar disorder and anxiety: She has recently experienced the loss of her spouse, which has contributed to a decrease in appetite and subsequent weight loss. She continues to see a psychiatrist for therapy.She has good support from friends.  Polycythemia vera: She has not noted skin erythema or pruritus. Has not seen hemologist within the past few years.  Lab Results  Component Value Date   WBC 8.2 05/11/2019   HGB 16.0 (H) 05/14/2019   HCT 47.0 (H) 05/14/2019   MCV 100.2 (H) 05/11/2019   PLT 239 05/11/2019  Hypertension: Currently she is on losartan 25 mg daily. Hypokalemia on K-Lor 20 mK daily. Negative for severe/frequent headache, visual changes, chest pain, dyspnea, palpitation, claudication, focal weakness, or edema. Follows with cardiologist annually.  Lab Results   Component Value Date   CREATININE 1.02 05/20/2020   BUN 16 05/20/2020   NA 144 05/20/2020   K 4.2 05/20/2020   CL 100 05/20/2020   CO2 33 (H) 05/20/2020   HLD: She is on atorvastatin 40 mg daily.  Review of Systems  Constitutional:  Positive for fatigue. Negative for activity change, appetite change and fever.  HENT:  Negative for mouth sores and nosebleeds.   Respiratory:  Negative for cough and wheezing.   Gastrointestinal:  Negative for abdominal pain, nausea and vomiting.       Negative for changes in bowel habits.  Genitourinary:  Negative for decreased urine volume and hematuria.  Neurological:  Negative for syncope, facial asymmetry and weakness.  Rest see pertinent positives and negatives per HPI.  Current Outpatient Medications on File Prior to Visit  Medication Sig Dispense Refill   acetaminophen (TYLENOL) 500 MG tablet Take 1,000 mg by mouth every 6 (six) hours as needed for moderate pain.      albuterol (VENTOLIN HFA) 108 (90 Base) MCG/ACT inhaler Inhale 2 puffs into the lungs every 6 (six) hours as needed for wheezing or shortness of breath. 32 g 0   ALPRAZolam (XANAX) 1 MG tablet Take 1 mg by mouth 3 (three) times daily.     amphetamine-dextroamphetamine (ADDERALL) 20 MG tablet Take 20 mg by mouth in the morning, at noon, in the evening, and at bedtime.      Ascorbic Acid (VITAMIN C) 500 MG tablet Take 1,000 mg by mouth daily.     aspirin 81 MG tablet Take 1 tablet (81 mg total) by mouth daily. 30 tablet  atorvastatin (LIPITOR) 20 MG tablet TAKE 1 TABLET BY MOUTH EVERY DAY IN THE EVENING 90 tablet 4   b complex vitamins tablet Take 1 tablet by mouth daily.     Calcium Citrate (CITRACAL PO) Take 2 tablets by mouth daily.     Cholecalciferol (VITAMIN D3) 5000 UNITS TABS Take 10,000 Units by mouth in the morning and at bedtime.     Chromium Picolinate 500 MCG TABS Take 500 mcg by mouth daily.      clonazePAM (KLONOPIN) 1 MG tablet Take 1 tablet (1 mg total) by mouth 3  (three) times daily. (Patient taking differently: Take 1 mg by mouth in the morning, at noon, in the evening, and at bedtime.) 30 tablet 0   diclofenac Sodium (VOLTAREN) 1 % GEL Apply 2 g topically 3 (three) times daily as needed (knee pain).     furosemide (LASIX) 40 MG tablet Take 2 tablets (80 mg total) by mouth 2 (two) times daily. 360 tablet 3   [name] 1.5/30 1.5-30 MG-MCG tablet Take 1 tablet by mouth at bedtime.     levalbuterol (XOPENEX HFA) 45 MCG/ACT inhaler Inhale 2 puffs into the lungs every 6 (six) hours as needed for wheezing. 45 g 3   lidocaine (LIDODERM) 5 % PLACE 1 PATCH ONTO THE SKIN DAILY AS NEEDED FOR KNEE PAIN. REMOVE AND DISCARD PATCH WITHIN 12 HOURS OR AS DIRECTED BY DOCTOR. 30 patch 0   losartan (COZAAR) 25 MG tablet TAKE 1 TABLET TWICE A DAY 180 tablet 1   LYRICA 300 MG capsule Take 300 mg by mouth 2 (two) times daily.      MAGNESIUM PO Take 500 mg by mouth daily.      methocarbamol (ROBAXIN-750) 750 MG tablet Take 1 tablet (750 mg total) by mouth 3 (three) times daily as needed for muscle spasms. 90 tablet 1   Multiple Vitamin (MULTIVITAMIN) tablet Take 1 tablet by mouth daily.     NUEDEXTA 20-10 MG CAPS Take 1 capsule by mouth every 12 (twelve) hours.   12   Omega-3 Fatty Acids (FISH OIL) 1200 MG CAPS Take 2,400 mg by mouth daily.      potassium chloride SA (KLOR-CON M20) 20 MEQ tablet Take 1 tablet (20 mEq total) by mouth daily. 90 tablet 4   VRAYLAR 6 MG CAPS Take 6 mg by mouth every evening.      Zinc 50 MG CAPS Take 50 mg by mouth daily.     No current facility-administered medications on file prior to visit.   Past Medical History:  Diagnosis Date   Acute systolic congestive heart failure (HCC)    Agoraphobia    Anxiety    panic attacks   Arthritis    osteoarthritis bilal. knees   Asthma    no per PFT 7/13; reports does not have asthma   Bipolar disorder (HCC)    Carpal tunnel syndrome of left wrist 05/2011   Being evaluated for MS   Cor pulmonale (HCC)     Depression    History of pleural effusion    Hyperlipidemia    Hypertension    Hypoxemia    history of - no home O2   IBS (irritable bowel syndrome)    Morbid obesity (HCC)    Obesity hypoventilation syndrome (HCC)    Pneumonia    Pre-diabetes    Prediabetes    Seasonal allergies    current runny nose   Seizures (HCC)    febrile seizure x 1 as a child. Several  times   Sleep apnea sleep study 07/02/2010   uses CPAP nightly   Spinal stenosis in cervical region    Urinary incontinence    Allergies  Allergen Reactions   Savella [Milnacipran Hcl]     mania    Social History   Socioeconomic History   Marital status: Married    Spouse name: Not on file   Number of children: Not on file   Years of education: Not on file   Highest education level: Bachelor's degree (e.g., BA, AB, BS)  Occupational History   Occupation: currently unemployed  Tobacco Use   Smoking status: Never   Smokeless tobacco: Never  Substance and Sexual Activity   Alcohol use: Yes    Comment: rare   Drug use: No   Sexual activity: Not on file  Other Topics Concern   Not on file  Social History Narrative   Lives in [place]         Social Determinants of Health   Financial Resource Strain: Medium Risk (11/13/2021)   Overall Financial Resource Strain (CARDIA)    Difficulty of Paying Living Expenses: Somewhat hard  Food Insecurity: No Food Insecurity (11/13/2021)   Hunger Vital Sign    Worried About Running Out of Food in the Last Year: Never true    Ran Out of Food in the Last Year: Never true  Transportation Needs: No Transportation Needs (11/13/2021)   PRAPARE - Administrator, Civil Service (Medical): No    Lack of Transportation (Non-Medical): No  Physical Activity: Insufficiently Active (11/13/2021)   Exercise Vital Sign    Days of Exercise per Week: 1 day    Minutes of Exercise per Session: 10 min  Stress: Stress Concern Present (11/13/2021)   [employer] of  Occupational Health - Occupational Stress Questionnaire    Feeling of Stress : Very much  Social Connections: Moderately Isolated (11/13/2021)   Social Connection and Isolation Panel [NHANES]    Frequency of Communication with Friends and Family: More than three times a week    Frequency of Social Gatherings with Friends and Family: More than three times a week    Attends Religious Services: 1 to 4 times per year    Active Member of [employer] or Organizations: No    Attends Banker Meetings: Never    Marital Status: Widowed   Vitals:   11/14/21 1520  BP: 130/80  Pulse: 91  Resp: 16  SpO2: 94%   Body mass index is 51.59 kg/m.  Physical Exam Vitals and nursing note reviewed.  Constitutional:      General: She is not in acute distress.    Appearance: She is well-developed.  HENT:     Head: Normocephalic and atraumatic.     Mouth/Throat:     Mouth: Mucous membranes are dry.     Pharynx: Oropharynx is clear.  Eyes:     Conjunctiva/sclera: Conjunctivae normal.  Cardiovascular:     Rate and Rhythm: Normal rate and regular rhythm.     Pulses:          Dorsalis pedis pulses are 2+ on the right side and 2+ on the left side.     Heart sounds: No murmur heard. Pulmonary:     Effort: Pulmonary effort is normal. No respiratory distress.     Breath sounds: Normal breath sounds.  Abdominal:     Palpations: Abdomen is soft. There is no mass.     Tenderness: There is no abdominal tenderness.  Musculoskeletal:     Right ankle:     Right Achilles Tendon: Thompson's test negative.     Left ankle:     Left Achilles Tendon: Tenderness present. Thompson's test negative.     Comments: Left foot:Tenderness upon palpation of heel mainly at the medial insertion of plantar fascia into calcaneous. Also mild discomfort with palpation along planta fascia towards forefoot. small bunion  Dorsal flexion of first MTP elicits pain.  No edema or erythema appreciated on area.    Lymphadenopathy:     Cervical: No cervical adenopathy.  Skin:    General: Skin is warm.     Findings: No erythema or rash.  Neurological:     General: No focal deficit present.     Mental Status: She is alert and oriented to person, place, and time.     Cranial Nerves: No cranial nerve deficit.     Gait: Gait normal.     Comments: Antalgic gait.   ASSESSMENT AND PLAN:  Ms.Francy was seen today for follow-up.  Diagnoses and all orders for this visit: Orders Placed This Encounter  Procedures   Flu Vaccine QUAD 33mo+IM (Fluarix, Fluzone & Alfiuria Quad PF)   Pneumococcal polysaccharide vaccine 23-valent greater than or equal to 52yo subcutaneous/IM   CBC   Comprehensive metabolic panel   Lipid panel   Microalbumin/Creatinine Ratio, Urine   LDL cholesterol, direct   Ambulatory referral to Podiatry   POC HgB A1c   Lab Results  Component Value Date   HGBA1C 5.9 (A) 11/14/2021   Lab Results  Component Value Date   CHOL 137 11/14/2021   HDL 54.70 11/14/2021   LDLCALC 76 07/11/2017   LDLDIRECT 59.0 11/14/2021   TRIG 210.0 (H) 11/14/2021   CHOLHDL 3 11/14/2021   Lab Results  Component Value Date   WBC 5.6 11/14/2021   HGB 14.3 11/14/2021   HCT 43.6 11/14/2021   MCV 95.6 11/14/2021   PLT 202.0 11/14/2021   Lab Results  Component Value Date   CREATININE 1.14 11/14/2021   BUN 14 11/14/2021   NA 140 11/14/2021   K 4.6 11/14/2021   CL 100 11/14/2021   CO2 32 11/14/2021   Lab Results  Component Value Date   ALT 34 11/14/2021   AST 37 11/14/2021   ALKPHOS 49 11/14/2021   BILITOT 0.3 11/14/2021   Left foot pain We discussed possible etiologies. Examination today suggestive of plantar fasciitis and Achilles tendinitis. Stretching exercises, comfortable shoes, shoe inserts, and night ankle splints may help. Appointment with podiatrist will be arranged to be discussed other treatment options.  Type 2 diabetes mellitus with other specified complication, without  long-term current use of insulin (HCC) HgA1C at goal, 5.9. She has not been consistent with taking metformin, last refill in 03/2021.  Show recommend continuing nonpharmacologic treatment. She needs to start monitoring BS regularly and instructed to let me know if she has high readings. Regular exercise as tolerated and healthy diet with avoidance of added sugar food intake is an important part of treatment and encouraged. Annual eye exam, periodic dental and foot care recommended. F/U in 4-5 months.  Hyperlipidemia, unspecified hyperlipidemia type Continue atorvastatin 20 mg daily and low-fat diet.  Essential hypertension, benign BP is adequately controlled. Continue losartan 25 mg daily. Monitor BP regularly. Continue low-salt diet.  Hypokalemia Continue K-Lor 20 mK daily. Further recommendation will be given according to BMP result.  Bipolar affective disorder, remission status unspecified (HCC) Following with psychiatrist regularly.  Polycythemia vera (HCC) H/H  has been stable. Asymptomatic. CBC ordered today.  Need for influenza vaccination -     Flu Vaccine QUAD 37mo+IM (Fluarix, Fluzone & Alfiuria Quad PF)  Need for pneumococcal vaccination -     Pneumococcal polysaccharide vaccine 23-valent greater than or equal to 52yo subcutaneous/IM  Return in about 5 months (around 04/15/2022).  Manolo Bosket G. [place], MD  [facility]. [place] [facility].

## 2021-11-13 NOTE — Telephone Encounter (Signed)
error 

## 2021-11-14 ENCOUNTER — Ambulatory Visit (INDEPENDENT_AMBULATORY_CARE_PROVIDER_SITE_OTHER): Payer: Medicare PPO | Admitting: Family Medicine

## 2021-11-14 ENCOUNTER — Ambulatory Visit (INDEPENDENT_AMBULATORY_CARE_PROVIDER_SITE_OTHER): Payer: Medicare PPO | Admitting: Psychology

## 2021-11-14 ENCOUNTER — Encounter: Payer: Self-pay | Admitting: Family Medicine

## 2021-11-14 VITALS — BP 130/80 | HR 91 | Resp 16 | Ht 65.0 in | Wt 310.0 lb

## 2021-11-14 DIAGNOSIS — M79672 Pain in left foot: Secondary | ICD-10-CM | POA: Diagnosis not present

## 2021-11-14 DIAGNOSIS — E1169 Type 2 diabetes mellitus with other specified complication: Secondary | ICD-10-CM

## 2021-11-14 DIAGNOSIS — D45 Polycythemia vera: Secondary | ICD-10-CM

## 2021-11-14 DIAGNOSIS — F319 Bipolar disorder, unspecified: Secondary | ICD-10-CM

## 2021-11-14 DIAGNOSIS — I10 Essential (primary) hypertension: Secondary | ICD-10-CM | POA: Diagnosis not present

## 2021-11-14 DIAGNOSIS — E785 Hyperlipidemia, unspecified: Secondary | ICD-10-CM | POA: Diagnosis not present

## 2021-11-14 DIAGNOSIS — Z23 Encounter for immunization: Secondary | ICD-10-CM

## 2021-11-14 DIAGNOSIS — G959 Disease of spinal cord, unspecified: Secondary | ICD-10-CM

## 2021-11-14 DIAGNOSIS — F4321 Adjustment disorder with depressed mood: Secondary | ICD-10-CM

## 2021-11-14 DIAGNOSIS — E876 Hypokalemia: Secondary | ICD-10-CM

## 2021-11-14 LAB — POCT GLYCOSYLATED HEMOGLOBIN (HGB A1C): Hemoglobin A1C: 5.9 % — AB (ref 4.0–5.6)

## 2021-11-14 MED ORDER — ACCU-CHEK AVIVA PLUS W/DEVICE KIT: PACK | 0 refills | Status: AC

## 2021-11-14 NOTE — Progress Notes (Signed)
[name] is a 51 y.o. female patient   Treatment Plan: Date: 11/14/2021  Diagnosis 296.80 (Unspecified bipolar or related disorder) [n/a]  Symptoms Depressed or irritable mood. (Status: maintained) -- No Description Entered  Diminished interest in or enjoyment of activities. (Status: maintained) -- No Description Entered  Lack of energy. (Status: maintained) -- No Description Entered  Low self-esteem. (Status: maintained) -- No Description Entered  Medication Status compliance  Safety none  If Suicidal or Homicidal State Action Taken: unspecified  Current Risk: low Medications Adderall (Dosage: '20mg'$  4X/day)  Klonapin (Dosage: '1mg'$  3X/day)  Lamictal (Dosage: '200mg'$  2x/day)  Nuedexta (Dosage: '20mg'$ )  Vyralar (Dosage: '6mg'$ )  Xanax (Dosage: '1mg'$  3X/day)  Objectives Related Problem: Develop healthy cognitive patterns and beliefs about self and the world that lead to alleviation and help prevent the relapse of mood episodes. Description: State no longer having thoughts of self-harm. Target Date: 2022-01-07 Frequency: Daily Modality: individual Progress: 80%  Related Problem: Develop healthy cognitive patterns and beliefs about self and the world that lead to alleviation and help prevent the relapse of mood episodes. Description: Verbalize any history of past and present suicidal thoughts and actions. Target Date: 2022-01-07 Frequency: Daily Modality: individual Progress: 80%  Related Problem: Develop healthy cognitive patterns and beliefs about self and the world that lead to alleviation and help prevent the relapse of mood episodes. Description: Take prescribed medications as directed. Target Date: 2022-01-07 Frequency: Daily Modality: individual Progress: 90%  Related Problem: Develop healthy cognitive patterns and beliefs about self and the world that lead to alleviation and help prevent the relapse of mood episodes. Description: Discuss and resolve troubling  personal and interpersonal issues. Target Date: 2022-01-07 Frequency: Daily Modality: individual Progress: 80%  Client Response full compliance  Service Location Location, [name] [place], [place] [address] -virtual Service Code cpt [phone]  Emotion regulation skills  Related past to present  Rationally challenge thoughts or beliefs/cognitive restructuring  Identify/label emotions  Validate/empathize  Self care activities  Lifestyle change (exercise, nutrition)  Self-monitoring  Facilitate problem solving  Session notes:  f31.9 Bipolar Unspecified.  Goals: Mood stabilization, improved marital relationship, improve self esteem and elimination of self harm thoughts/tendencies. Also, patient struggles with leaving home comfortably and seeks to improve this ability. Target date for these goals is January, 2023. In addition, she needs to improve self care behaviors due to her weight and various medical conditions. Target date for this goal is Feb., 2022. Date revised to December, 2023 to allow time for behavioral implementation. Reports periodic outbursts of rage/anger. Seeks to minimize. Target date is December, 2023. Patient has realized improvement in all areas. Requests additional treatment to facilitate maintenance of progress. Goal date 2023/02/10. Patient's husband died unexpectedly and she is suffering complicated grief. She needs additional counseling to address associated issues and manage feelings of loss, despair and anxiety. Goal date 8-24.  Meds: Vyralar ('6mg'$ ),Nuedexta (40+'20mg'$ /day), Adderall ('20mg'$  4X/day), Xanax ('1mg'$ , 3X/day), and Klonopin ('1mg'$  3X/day).  Patient has requested a video session. She is at home and I am in my home office.  [name] says that she is going to friends houses for dinner several nights/week. She has lost 31 pounds in 5 weeks. She is not eating well and has not been to grocery store. We talked about the need for her to get good nutrition and ways to get  herself to the store. She has been getting things around the house accomplished and is doing a good job. [name] reports being emotionally more  stable than she has been in the past few weeks. She still has not called about the grief groups stating that she is "too busy" and reluctant to go and "hear about people's worst experiences". This loss has brought up some of her emotional memories when her close friend [name] died. She is thinking she might like to have a roommate at some point. Her birthday is on Sunday and she will hang out with her friend [name].                              [name]                                                                                                                                                                                        Jessica Morel, PhD  Time:2:10p-3:00p 50 minutes.                           n

## 2021-11-14 NOTE — Patient Instructions (Addendum)
A few things to remember from today's visit:   Type 2 diabetes mellitus with other specified complication, without long-term current use of insulin ([place]) - Plan: POC HgB A1c, Blood Glucose Monitoring Suppl (ACCU-CHEK AVIVA PLUS) w/Device KIT  Hyperlipidemia, unspecified hyperlipidemia type  Essential hypertension, benign  Left foot pain - Plan: Ambulatory referral to Podiatry  Appt with podiatrist will be arranged. Stop Metformin. No changes in rest.  If you need refills for medications you take chronically, please call your pharmacy. Do not use My Chart to request refills or for acute issues that need immediate attention. If you send a my chart message, it may take a few days to be addressed, specially if I am not in the office.  Please be sure medication list is accurate. If a new problem present, please set up appointment sooner than planned today.

## 2021-11-15 LAB — COMPREHENSIVE METABOLIC PANEL
ALT: 34 U/L (ref 0–35)
AST: 37 U/L (ref 0–37)
Albumin: 4.1 g/dL (ref 3.5–5.2)
Alkaline Phosphatase: 49 U/L (ref 39–117)
BUN: 14 mg/dL (ref 6–23)
CO2: 32 mEq/L (ref 19–32)
Calcium: 9.6 mg/dL (ref 8.4–10.5)
Chloride: 100 mEq/L (ref 96–112)
Creatinine, Ser: 1.14 mg/dL (ref 0.40–1.20)
GFR: 55.55 mL/min — ABNORMAL LOW (ref >60.00)
Glucose, Bld: 102 mg/dL — ABNORMAL HIGH (ref 70–99)
Potassium: 4.6 mEq/L (ref 3.5–5.1)
Sodium: 140 mEq/L (ref 135–145)
Total Bilirubin: 0.3 mg/dL (ref 0.2–1.2)
Total Protein: 7 g/dL (ref 6.0–8.3)

## 2021-11-15 LAB — CBC
HCT: 43.6 % (ref 36.0–46.0)
Hemoglobin: 14.3 g/dL (ref 12.0–15.0)
MCHC: 32.8 g/dL (ref 30.0–36.0)
MCV: 95.6 fl (ref 78.0–100.0)
Platelets: 202 10*3/uL (ref 150.0–400.0)
RBC: 4.56 Mil/uL (ref 3.87–5.11)
RDW: 14 % (ref 11.5–15.5)
WBC: 5.6 10*3/uL (ref 4.0–10.5)

## 2021-11-15 LAB — LIPID PANEL
Cholesterol: 137 mg/dL (ref 0–200)
HDL: 54.7 mg/dL (ref >39.00)
NonHDL: 82.74
Total CHOL/HDL Ratio: 3
Triglycerides: 210 mg/dL — ABNORMAL HIGH (ref 0.0–149.0)
VLDL: 42 mg/dL — ABNORMAL HIGH (ref 0.0–40.0)

## 2021-11-15 LAB — LDL CHOLESTEROL, DIRECT: Direct LDL: 59 mg/dL

## 2021-11-21 ENCOUNTER — Ambulatory Visit (INDEPENDENT_AMBULATORY_CARE_PROVIDER_SITE_OTHER): Payer: Medicare PPO | Admitting: Psychology

## 2021-11-21 DIAGNOSIS — F4321 Adjustment disorder with depressed mood: Secondary | ICD-10-CM

## 2021-11-21 NOTE — Progress Notes (Signed)
KIRSTAN FENTRESS is a 51 y.o. female patient   Treatment Plan: Date: 11/21/2021  Diagnosis 296.80 (Unspecified bipolar or related disorder) [n/a]  Symptoms Depressed or irritable mood. (Status: maintained) -- No Description Entered  Diminished interest in or enjoyment of activities. (Status: maintained) -- No Description Entered  Lack of energy. (Status: maintained) -- No Description Entered  Low self-esteem. (Status: maintained) -- No Description Entered  Medication Status compliance  Safety none  If Suicidal or Homicidal State Action Taken: unspecified  Current Risk: low Medications Adderall (Dosage: '20mg'$  4X/day)  Klonapin (Dosage: '1mg'$  3X/day)  Lamictal (Dosage: '200mg'$  2x/day)  Nuedexta (Dosage: '20mg'$ )  Vyralar (Dosage: '6mg'$ )  Xanax (Dosage: '1mg'$  3X/day)  Objectives Related Problem: Develop healthy cognitive patterns and beliefs about self and the world that lead to alleviation and help prevent the relapse of mood episodes. Description: State no longer having thoughts of self-harm. Target Date: 2022-01-07 Frequency: Daily Modality: individual Progress: 80%  Related Problem: Develop healthy cognitive patterns and beliefs about self and the world that lead to alleviation and help prevent the relapse of mood episodes. Description: Verbalize any history of past and present suicidal thoughts and actions. Target Date: 2022-01-07 Frequency: Daily Modality: individual Progress: 80%  Related Problem: Develop healthy cognitive patterns and beliefs about self and the world that lead to alleviation and help prevent the relapse of mood episodes. Description: Take prescribed medications as directed. Target Date: 2022-01-07 Frequency: Daily Modality: individual Progress: 90%  Related Problem: Develop healthy cognitive patterns and beliefs about self and the world that lead to alleviation and help prevent the relapse of mood episodes. Description: Discuss and resolve troubling  personal and interpersonal issues. Target Date: 2022-01-07 Frequency: Daily Modality: individual Progress: 80%  Client Response full compliance  Service Location Location, [name] [place], [place] [address] -virtual Service Code cpt [phone]  Emotion regulation skills  Related past to present  Rationally challenge thoughts or beliefs/cognitive restructuring  Identify/label emotions  Validate/empathize  Self care activities  Lifestyle change (exercise, nutrition)  Self-monitoring  Facilitate problem solving  Session notes:  f31.9 Bipolar Unspecified.  Goals: Mood stabilization, improved marital relationship, improve self esteem and elimination of self harm thoughts/tendencies. Also, patient struggles with leaving home comfortably and seeks to improve this ability. Target date for these goals is January, 2023. In addition, she needs to improve self care behaviors due to her weight and various medical conditions. Target date for this goal is Feb., 2022. Date revised to December, 2023 to allow time for behavioral implementation. Reports periodic outbursts of rage/anger. Seeks to minimize. Target date is December, 2023. Patient has realized improvement in all areas. Requests additional treatment to facilitate maintenance of progress. Goal date January 31, 2023. Patient's husband died unexpectedly and she is suffering complicated grief. She needs additional counseling to address associated issues and manage feelings of loss, despair and anxiety. Goal date 8-24.  Meds: Vyralar ('6mg'$ ),Nuedexta (40+'20mg'$ /day), Adderall ('20mg'$  4X/day), Xanax ('1mg'$ , 3X/day), and Klonopin ('1mg'$  3X/day).  Patient has requested a video session. She is at home and I am in my home office.  Brendalyn had her birthday on Sunday and says it was good, "but not like when [name] was around". Her friend Romie Minus spent the night. She still has not taken care of going to the grocery store or arranging for some meals. She says she will do this but  just isn't ready.Thinking she may use a delivery service rather than go to the store herself. She says she has a bad relationship  with food and she doesn't trust herself.  She also has not arranged for the grief group. Again, says she will do this and has already asked her friend Romie Minus to attend with her. Romie Minus agreed. Joory says she is mad that she is having numbness in her right arm and having pain in her elbow down to her hand. She fears she is having a problem in her neck where she had surgery. She will schedule with a neurologist. She says that she does not have much going on right now. On Nov. 2nd she is supposed to be going to the mountains with her friend Patrick Jupiter for the day.                                   [name]                                                                                                                                                                                        [name], PhD  Time:2:10p-3:00p 50 minutes.                           n

## 2021-11-28 ENCOUNTER — Ambulatory Visit (INDEPENDENT_AMBULATORY_CARE_PROVIDER_SITE_OTHER): Payer: Medicare PPO | Admitting: Psychology

## 2021-11-28 DIAGNOSIS — F4321 Adjustment disorder with depressed mood: Secondary | ICD-10-CM

## 2021-11-28 NOTE — Progress Notes (Signed)
[name] is a 51 y.o. female patient   Treatment Plan: Date: 11/28/2021  Diagnosis 296.80 (Unspecified bipolar or related disorder) [n/a]  Symptoms Depressed or irritable mood. (Status: maintained) -- No Description Entered  Diminished interest in or enjoyment of activities. (Status: maintained) -- No Description Entered  Lack of energy. (Status: maintained) -- No Description Entered  Low self-esteem. (Status: maintained) -- No Description Entered  Medication Status compliance  Safety none  If Suicidal or Homicidal State Action Taken: unspecified  Current Risk: low Medications Adderall (Dosage: '20mg'$  4X/day)  Klonapin (Dosage: '1mg'$  3X/day)  Lamictal (Dosage: '200mg'$  2x/day)  Nuedexta (Dosage: '20mg'$ )  Vyralar (Dosage: '6mg'$ )  Xanax (Dosage: '1mg'$  3X/day)  Objectives Related Problem: Develop healthy cognitive patterns and beliefs about self and the world that lead to alleviation and help prevent the relapse of mood episodes. Description: State no longer having thoughts of self-harm. Target Date: 2022-01-07 Frequency: Daily Modality: individual Progress: 80%  Related Problem: Develop healthy cognitive patterns and beliefs about self and the world that lead to alleviation and help prevent the relapse of mood episodes. Description: Verbalize any history of past and present suicidal thoughts and actions. Target Date: 2022-01-07 Frequency: Daily Modality: individual Progress: 80%  Related Problem: Develop healthy cognitive patterns and beliefs about self and the world that lead to alleviation and help prevent the relapse of mood episodes. Description: Take prescribed medications as directed. Target Date: 2022-01-07 Frequency: Daily Modality: individual Progress: 90%  Related Problem: Develop healthy cognitive patterns and beliefs about self and the world that lead to alleviation and help prevent the relapse of mood episodes. Description:  Discuss and resolve troubling personal and interpersonal issues. Target Date: 2022-01-07 Frequency: Daily Modality: individual Progress: 80%  Client Response full compliance  Service Location Location, [name] [place], [place] [address] -virtual Service Code cpt [phone]  Emotion regulation skills  Related past to present  Rationally challenge thoughts or beliefs/cognitive restructuring  Identify/label emotions  Validate/empathize  Self care activities  Lifestyle change (exercise, nutrition)  Self-monitoring  Facilitate problem solving  Session notes:  f31.9 Bipolar Unspecified.  Goals: Mood stabilization, improved marital relationship, improve self esteem and elimination of self harm thoughts/tendencies. Also, patient struggles with leaving home comfortably and seeks to improve this ability. Target date for these goals is January, 2023. In addition, she needs to improve self care behaviors due to her weight and various medical conditions. Target date for this goal is Feb., 2022. Date revised to December, 2023 to allow time for behavioral implementation. Reports periodic outbursts of rage/anger. Seeks to minimize. Target date is December, 2023. Patient has realized improvement in all areas. Requests additional treatment to facilitate maintenance of progress. Goal date 2023/02/17. Patient's husband died unexpectedly and she is suffering complicated grief. She needs additional counseling to address associated issues and manage feelings of loss, despair and anxiety. Goal date 8-24.  Meds: Vyralar ('6mg'$ ),Nuedexta (40+'20mg'$ /day), Adderall ('20mg'$  4X/day), Xanax ('1mg'$ , 3X/day), and Klonopin ('1mg'$  3X/day).  Patient has requested a video session. She is at home and I am in my home office.  [name] still planning to go to the mountains with her friend this week. She fell on a dog toy this week. She called the doctor (primary care) and is waiting to hear back. It has been several days and I told her to  call. We talked about her self care and the struggles she is having.  She still has not been to the grocery store because she "cannot get anyone to go with her". She hasnt been to a grocery store in years because she says she cannot walk that far. She says she is mostly not eating and has lost a total of 35 pounds. After some discussion, says she is willing to try a delivery service for groceries and she made out a list.   She says it is her worst fear in life to be alone and now it has happened. She says it has always been a fear for her. Associated this with being given up for adoption by her parents. [name] says that she does not "deserve" to have certain foods "because [name] is not here". Her friend is wanting a divorce from her husband. [name] wants to be there for her, but [name] will not talk to her about it because [name] lost her husband.  Her sleep is completely off schedule. She is getting to sleep at 5am and sleeping until 1:00pm. Has tried to get back on track, but not successful to date. Will work on it this week and report back. Talked about possibility of medicine to help if she is not successful.                                       [name]                                                                                                                                                                                        [name], PhD  Time:2:10p-3:00p 50 minutes.                           n

## 2021-12-04 ENCOUNTER — Telehealth: Payer: Self-pay | Admitting: Cardiology

## 2021-12-04 NOTE — Telephone Encounter (Signed)
*  STAT* If patient is at the pharmacy, call can be transferred to refill team.   1. Which medications need to be refilled? (please list name of each medication and dose if known)  furosemide (LASIX) 40 MG tablet losartan (COZAAR) 25 MG tablet  2. Which pharmacy/location (including street and city if local pharmacy) is medication to be sent to? [place], [place]   3. Do they need a 30 day or 90 day supply? 90 day supply

## 2021-12-05 ENCOUNTER — Ambulatory Visit (INDEPENDENT_AMBULATORY_CARE_PROVIDER_SITE_OTHER): Payer: Medicare PPO | Admitting: Psychology

## 2021-12-05 DIAGNOSIS — F4321 Adjustment disorder with depressed mood: Secondary | ICD-10-CM | POA: Diagnosis not present

## 2021-12-05 MED ORDER — FUROSEMIDE 40 MG PO TABS: 80.0000 mg | ORAL_TABLET | Freq: Two times a day (BID) | ORAL | 3 refills | Status: DC

## 2021-12-05 MED ORDER — LOSARTAN POTASSIUM 25 MG PO TABS: 25.0000 mg | ORAL_TABLET | Freq: Two times a day (BID) | ORAL | 2 refills | Status: DC

## 2021-12-05 NOTE — Progress Notes (Signed)
[name] is a 51 y.o. female patient   Treatment Plan: Date: 12/05/2021  Diagnosis 296.80 (Unspecified bipolar or related disorder) [n/a]  Symptoms Depressed or irritable mood. (Status: maintained) -- No Description Entered  Diminished interest in or enjoyment of activities. (Status: maintained) -- No Description Entered  Lack of energy. (Status: maintained) -- No Description Entered  Low self-esteem. (Status: maintained) -- No Description Entered  Medication Status compliance  Safety none  If Suicidal or Homicidal State Action Taken: unspecified  Current Risk: low Medications Adderall (Dosage: 62m 4X/day)  Klonapin (Dosage: 132m3X/day)  Lamictal (Dosage: 20070mx/day)  Nuedexta (Dosage: 32m29mVyralar (Dosage: 6mg)74manax (Dosage: 1mg 349may)  Objectives Related Problem: Develop healthy cognitive patterns and beliefs about self and the world that lead to alleviation and help prevent the relapse of mood episodes. Description: State no longer having thoughts of self-harm. Target Date: 2022-01-07 Frequency: Daily Modality: individual Progress: 80%  Related Problem: Develop healthy cognitive patterns and beliefs about self and the world that lead to alleviation and help prevent the relapse of mood episodes. Description: Verbalize any history of past and present suicidal thoughts and actions. Target Date: 2022-01-07 Frequency: Daily Modality: individual Progress: 80%  Related Problem: Develop healthy cognitive patterns and beliefs about self and the world that lead to alleviation and help prevent the relapse of mood episodes. Description: Take prescribed medications as directed. Target Date: 2022-01-07 Frequency: Daily Modality: individual Progress: 90%  Related Problem: Develop healthy cognitive patterns and beliefs about self and the world that lead to alleviation and help prevent the relapse of  mood episodes. Description: Discuss and resolve troubling personal and interpersonal issues. Target Date: 2022-01-07 Frequency: Daily Modality: individual Progress: 80%  Client Response full compliance  [place] [address] Service Code cpt [id number] regulation skills  Related past to present  Rationally challenge thoughts or beliefs/cognitive restructuring  Identify/label emotions  Validate/empathize  Self care activities  Lifestyle change (exercise, nutrition)  Self-monitoring  Facilitate problem solving  Session notes:  f31.9 Bipolar Unspecified.  Goals: Mood stabilization, improved marital relationship, improve self esteem and elimination of self harm thoughts/tendencies. Also, patient struggles with leaving home comfortably and seeks to improve this ability. Target date for these goals is January, 2023. In addition, she needs to improve self care behaviors due to her weight and various medical conditions. Target date for this goal is Feb., 2022. Date revised to December, 2023 to allow time for behavioral implementation. Reports periodic outbursts of rage/anger. Seeks to minimize. Target date is December, 2023. Patient has realized improvement in all areas. Requests additional treatment to facilitate maintenance of progress. Goal date 12-23.01/10/2025ent's husband died unexpectedly and she is suffering complicated grief. She needs additional counseling to address associated issues and manage feelings of loss, despair and anxiety. Goal date 8-24.  Meds: Vyralar (6mg),N61mexta (40+32mg/da35mAdderall (32mg 4X/84m, Xanax (1mg, 3X/d23m, and Klonopin (1mg 3X/day60m Patient has requested a video session. She is at home and I am in my home office.  [name] says her week was "alright". She has an appointment for her foot next week on Tuesday. She went to her trip to the mountains with her friend for the day. They had a nice  time. Friend RobiAudrie Velasquez  is coming to spend the night with [name] at some point in the week. She met with attorney on Friday to deliver a number of papers and signed some papers.Biggest issue for now is getting on a good wake - sleep cycle and take care of daily tasks (including regular meals). She still sounds emotionally blocked/flat. Somewhat resistant to get any deeper into issues/feelings. She is guarded.                                                                                                                                                                                      Jessica Morel, PhD  Time:2:10p-3:00p 50 minutes.                           n

## 2021-12-11 ENCOUNTER — Encounter (HOSPITAL_COMMUNITY): Payer: Self-pay | Admitting: *Deleted

## 2021-12-11 ENCOUNTER — Inpatient Hospital Stay (HOSPITAL_COMMUNITY)
Admission: EM | Admit: 2021-12-11 | Discharge: 2022-01-05 | DRG: 853 | Disposition: A | Discharge status: Home Health | Payer: Medicare PPO | Attending: Family Medicine | Admitting: Family Medicine

## 2021-12-11 ENCOUNTER — Emergency Department (HOSPITAL_COMMUNITY): Payer: Medicare PPO | Admitting: Anesthesiology

## 2021-12-11 ENCOUNTER — Inpatient Hospital Stay: Admit: 2021-12-11 | Payer: Medicare PPO | Admitting: Surgery

## 2021-12-11 ENCOUNTER — Other Ambulatory Visit: Payer: Self-pay

## 2021-12-11 ENCOUNTER — Inpatient Hospital Stay (HOSPITAL_COMMUNITY): Payer: Medicare PPO

## 2021-12-11 ENCOUNTER — Emergency Department (HOSPITAL_COMMUNITY): Payer: Medicare PPO

## 2021-12-11 ENCOUNTER — Encounter (HOSPITAL_COMMUNITY)
Admission: EM | Disposition: A | Discharge status: Home Health | Payer: Self-pay | Source: Home / Self Care | Attending: Internal Medicine

## 2021-12-11 DIAGNOSIS — I509 Heart failure, unspecified: Secondary | ICD-10-CM

## 2021-12-11 DIAGNOSIS — E872 Acidosis, unspecified: Secondary | ICD-10-CM

## 2021-12-11 DIAGNOSIS — K279 Peptic ulcer, site unspecified, unspecified as acute or chronic, without hemorrhage or perforation: Secondary | ICD-10-CM | POA: Diagnosis not present

## 2021-12-11 DIAGNOSIS — K5720 Diverticulitis of large intestine with perforation and abscess without bleeding: Secondary | ICD-10-CM | POA: Diagnosis present

## 2021-12-11 DIAGNOSIS — J4489 Other specified chronic obstructive pulmonary disease: Secondary | ICD-10-CM | POA: Diagnosis present

## 2021-12-11 DIAGNOSIS — Z7982 Long term (current) use of aspirin: Secondary | ICD-10-CM | POA: Diagnosis not present

## 2021-12-11 DIAGNOSIS — D45 Polycythemia vera: Secondary | ICD-10-CM | POA: Diagnosis present

## 2021-12-11 DIAGNOSIS — Y848 Other medical procedures as the cause of abnormal reaction of the patient, or of later complication, without mention of misadventure at the time of the procedure: Secondary | ICD-10-CM | POA: Diagnosis not present

## 2021-12-11 DIAGNOSIS — T8143XA Infection following a procedure, organ and space surgical site, initial encounter: Secondary | ICD-10-CM | POA: Diagnosis not present

## 2021-12-11 DIAGNOSIS — F4000 Agoraphobia, unspecified: Secondary | ICD-10-CM | POA: Diagnosis present

## 2021-12-11 DIAGNOSIS — F319 Bipolar disorder, unspecified: Secondary | ICD-10-CM | POA: Diagnosis present

## 2021-12-11 DIAGNOSIS — A4151 Sepsis due to Escherichia coli [E. coli]: Secondary | ICD-10-CM | POA: Diagnosis present

## 2021-12-11 DIAGNOSIS — F4321 Adjustment disorder with depressed mood: Secondary | ICD-10-CM | POA: Diagnosis not present

## 2021-12-11 DIAGNOSIS — E785 Hyperlipidemia, unspecified: Secondary | ICD-10-CM | POA: Diagnosis present

## 2021-12-11 DIAGNOSIS — Z6841 Body Mass Index (BMI) 40.0 and over, adult: Secondary | ICD-10-CM | POA: Diagnosis not present

## 2021-12-11 DIAGNOSIS — K668 Other specified disorders of peritoneum: Secondary | ICD-10-CM | POA: Diagnosis not present

## 2021-12-11 DIAGNOSIS — F411 Generalized anxiety disorder: Secondary | ICD-10-CM | POA: Diagnosis present

## 2021-12-11 DIAGNOSIS — I5042 Chronic combined systolic (congestive) and diastolic (congestive) heart failure: Secondary | ICD-10-CM | POA: Diagnosis present

## 2021-12-11 DIAGNOSIS — K651 Peritoneal abscess: Secondary | ICD-10-CM | POA: Diagnosis not present

## 2021-12-11 DIAGNOSIS — E875 Hyperkalemia: Secondary | ICD-10-CM | POA: Diagnosis not present

## 2021-12-11 DIAGNOSIS — G4733 Obstructive sleep apnea (adult) (pediatric): Secondary | ICD-10-CM

## 2021-12-11 DIAGNOSIS — Z825 Family history of asthma and other chronic lower respiratory diseases: Secondary | ICD-10-CM

## 2021-12-11 DIAGNOSIS — K219 Gastro-esophageal reflux disease without esophagitis: Secondary | ICD-10-CM | POA: Diagnosis present

## 2021-12-11 DIAGNOSIS — I110 Hypertensive heart disease with heart failure: Secondary | ICD-10-CM | POA: Diagnosis present

## 2021-12-11 DIAGNOSIS — E662 Morbid (severe) obesity with alveolar hypoventilation: Secondary | ICD-10-CM | POA: Diagnosis present

## 2021-12-11 DIAGNOSIS — Z91199 Patient's noncompliance with other medical treatment and regimen due to unspecified reason: Secondary | ICD-10-CM

## 2021-12-11 DIAGNOSIS — E876 Hypokalemia: Secondary | ICD-10-CM | POA: Diagnosis present

## 2021-12-11 DIAGNOSIS — I10 Essential (primary) hypertension: Secondary | ICD-10-CM | POA: Diagnosis present

## 2021-12-11 DIAGNOSIS — E1169 Type 2 diabetes mellitus with other specified complication: Secondary | ICD-10-CM | POA: Diagnosis present

## 2021-12-11 DIAGNOSIS — K650 Generalized (acute) peritonitis: Secondary | ICD-10-CM | POA: Diagnosis present

## 2021-12-11 DIAGNOSIS — N170 Acute kidney failure with tubular necrosis: Secondary | ICD-10-CM | POA: Diagnosis present

## 2021-12-11 DIAGNOSIS — G40909 Epilepsy, unspecified, not intractable, without status epilepticus: Secondary | ICD-10-CM | POA: Diagnosis present

## 2021-12-11 DIAGNOSIS — Z79899 Other long term (current) drug therapy: Secondary | ICD-10-CM

## 2021-12-11 DIAGNOSIS — I429 Cardiomyopathy, unspecified: Secondary | ICD-10-CM | POA: Diagnosis present

## 2021-12-11 DIAGNOSIS — K632 Fistula of intestine: Secondary | ICD-10-CM | POA: Diagnosis not present

## 2021-12-11 DIAGNOSIS — Z4889 Encounter for other specified surgical aftercare: Secondary | ICD-10-CM

## 2021-12-11 DIAGNOSIS — K529 Noninfective gastroenteritis and colitis, unspecified: Secondary | ICD-10-CM

## 2021-12-11 DIAGNOSIS — Z820 Family history of epilepsy and other diseases of the nervous system: Secondary | ICD-10-CM

## 2021-12-11 HISTORY — PX: LAPAROSCOPY: SHX197

## 2021-12-11 LAB — I-STAT CHEM 8, ED
BUN: 15 mg/dL (ref 6–20)
Calcium, Ion: 1.13 mmol/L — ABNORMAL LOW (ref 1.15–1.40)
Chloride: 105 mmol/L (ref 98–111)
Creatinine, Ser: 1.9 mg/dL — ABNORMAL HIGH (ref 0.44–1.00)
Glucose, Bld: 201 mg/dL — ABNORMAL HIGH (ref 70–99)
HCT: 46 % (ref 36.0–46.0)
Hemoglobin: 15.6 g/dL — ABNORMAL HIGH (ref 12.0–15.0)
Potassium: 3.3 mmol/L — ABNORMAL LOW (ref 3.5–5.1)
Sodium: 142 mmol/L (ref 135–145)
TCO2: 19 mmol/L — ABNORMAL LOW (ref 22–32)

## 2021-12-11 LAB — COMPREHENSIVE METABOLIC PANEL
ALT: 21 U/L (ref 0–44)
AST: 33 U/L (ref 15–41)
Albumin: 2 g/dL — ABNORMAL LOW (ref 3.5–5.0)
Alkaline Phosphatase: 109 U/L (ref 38–126)
Anion gap: 18 — ABNORMAL HIGH (ref 5–15)
BUN: 14 mg/dL (ref 6–20)
CO2: 20 mmol/L — ABNORMAL LOW (ref 22–32)
Calcium: 9 mg/dL (ref 8.9–10.3)
Chloride: 105 mmol/L (ref 98–111)
Creatinine, Ser: 1.78 mg/dL — ABNORMAL HIGH (ref 0.44–1.00)
GFR, Estimated: 34 mL/min — ABNORMAL LOW (ref >60)
Glucose, Bld: 203 mg/dL — ABNORMAL HIGH (ref 70–99)
Potassium: 3.4 mmol/L — ABNORMAL LOW (ref 3.5–5.1)
Sodium: 143 mmol/L (ref 135–145)
Total Bilirubin: 0.5 mg/dL (ref 0.3–1.2)
Total Protein: 6 g/dL — ABNORMAL LOW (ref 6.5–8.1)

## 2021-12-11 LAB — CBC
HCT: 45.8 % (ref 36.0–46.0)
Hemoglobin: 15 g/dL (ref 12.0–15.0)
MCH: 30.4 pg (ref 26.0–34.0)
MCHC: 32.8 g/dL (ref 30.0–36.0)
MCV: 92.7 fL (ref 80.0–100.0)
Platelets: 365 10*3/uL (ref 150–400)
RBC: 4.94 MIL/uL (ref 3.87–5.11)
RDW: 13.9 % (ref 11.5–15.5)
WBC: 5.6 10*3/uL (ref 4.0–10.5)
nRBC: 0 % (ref 0.0–0.2)

## 2021-12-11 LAB — TYPE AND SCREEN
ABO/RH(D): O POS
Antibody Screen: NEGATIVE

## 2021-12-11 LAB — I-STAT BETA HCG BLOOD, ED (MC, WL, AP ONLY): I-stat hCG, quantitative: 43.9 m[IU]/mL — ABNORMAL HIGH (ref <5)

## 2021-12-11 LAB — LIPASE, BLOOD: Lipase: 21 U/L (ref 11–51)

## 2021-12-11 LAB — MAGNESIUM: Magnesium: 1.8 mg/dL (ref 1.7–2.4)

## 2021-12-11 LAB — LACTIC ACID, PLASMA: Lactic Acid, Venous: 6.1 mmol/L (ref 0.5–1.9)

## 2021-12-11 LAB — TROPONIN I (HIGH SENSITIVITY): Troponin I (High Sensitivity): 82 ng/L — ABNORMAL HIGH (ref <18)

## 2021-12-11 SURGERY — LAPAROSCOPY, DIAGNOSTIC
Anesthesia: General | Site: Abdomen

## 2021-12-11 MED ORDER — ROCURONIUM 10MG/ML (10ML) SYRINGE FOR MEDFUSION PUMP - OPTIME
INTRAVENOUS | Status: DC | PRN
  Administered 2021-12-11: 40 mg via INTRAVENOUS

## 2021-12-11 MED ORDER — INSULIN ASPART 100 UNIT/ML IJ SOLN
0.0000 [IU] | INTRAMUSCULAR | Status: DC
  Administered 2021-12-12 (×3): 3 [IU] via SUBCUTANEOUS
  Administered 2021-12-12 (×2): 4 [IU] via SUBCUTANEOUS
  Administered 2021-12-13 – 2021-12-14 (×7): 3 [IU] via SUBCUTANEOUS
  Administered 2021-12-15 (×2): 4 [IU] via SUBCUTANEOUS
  Administered 2021-12-15 – 2021-12-16 (×4): 3 [IU] via SUBCUTANEOUS
  Administered 2021-12-16: 4 [IU] via SUBCUTANEOUS
  Administered 2021-12-16 – 2021-12-17 (×2): 3 [IU] via SUBCUTANEOUS
  Administered 2021-12-17: 4 [IU] via SUBCUTANEOUS
  Administered 2021-12-17 (×3): 3 [IU] via SUBCUTANEOUS
  Administered 2021-12-17 – 2021-12-18 (×2): 4 [IU] via SUBCUTANEOUS
  Administered 2021-12-18: 3 [IU] via SUBCUTANEOUS
  Administered 2021-12-18 (×2): 4 [IU] via SUBCUTANEOUS
  Administered 2021-12-18: 3 [IU] via SUBCUTANEOUS
  Administered 2021-12-19: 4 [IU] via SUBCUTANEOUS
  Administered 2021-12-19 – 2021-12-21 (×8): 3 [IU] via SUBCUTANEOUS
  Administered 2021-12-22: 4 [IU] via SUBCUTANEOUS
  Administered 2021-12-22 – 2021-12-23 (×6): 3 [IU] via SUBCUTANEOUS
  Administered 2021-12-23 – 2021-12-24 (×3): 4 [IU] via SUBCUTANEOUS
  Administered 2021-12-24: 7 [IU] via SUBCUTANEOUS
  Administered 2021-12-24: 4 [IU] via SUBCUTANEOUS

## 2021-12-11 MED ORDER — HYDROMORPHONE HCL 1 MG/ML IJ SOLN
0.5000 mg | Freq: Once | INTRAMUSCULAR | Status: AC
  Administered 2021-12-11: 0.5 mg via INTRAVENOUS
  Filled 2021-12-11: qty 1

## 2021-12-11 MED ORDER — METRONIDAZOLE 500 MG/100ML IV SOLN
500.0000 mg | Freq: Once | INTRAVENOUS | Status: AC
  Administered 2021-12-11: 500 mg via INTRAVENOUS
  Filled 2021-12-11: qty 100

## 2021-12-11 MED ORDER — OXYCODONE HCL 5 MG/5ML PO SOLN: 5.0000 mg | Freq: Once | ORAL | Status: DC | PRN

## 2021-12-11 MED ORDER — HYDROMORPHONE HCL 1 MG/ML IJ SOLN: 0.2500 mg | INTRAMUSCULAR | Status: DC | PRN

## 2021-12-11 MED ORDER — POTASSIUM CHLORIDE 20 MEQ PO PACK
40.0000 meq | PACK | Freq: Once | ORAL | Status: AC
  Administered 2021-12-12: 40 meq
  Filled 2021-12-11 (×2): qty 2

## 2021-12-11 MED ORDER — PROPOFOL 10 MG/ML IV BOLUS
INTRAVENOUS | Status: AC
  Filled 2021-12-11: qty 20

## 2021-12-11 MED ORDER — MIDAZOLAM HCL 2 MG/2ML IJ SOLN: 0.5000 mg | Freq: Once | INTRAMUSCULAR | Status: DC | PRN

## 2021-12-11 MED ORDER — IOHEXOL 350 MG/ML SOLN
80.0000 mL | Freq: Once | INTRAVENOUS | Status: AC | PRN
  Administered 2021-12-11: 80 mL via INTRAVENOUS

## 2021-12-11 MED ORDER — LACTATED RINGERS IV SOLN
INTRAVENOUS | Status: DC
  Administered 2021-12-12: 100 mL/h via INTRAVENOUS

## 2021-12-11 MED ORDER — PHENYLEPHRINE HCL-NACL 20-0.9 MG/250ML-% IV SOLN
INTRAVENOUS | Status: DC | PRN
  Administered 2021-12-11: 50 ug/min via INTRAVENOUS

## 2021-12-11 MED ORDER — LACTATED RINGERS IV SOLN: INTRAVENOUS | Status: DC

## 2021-12-11 MED ORDER — HEPARIN SODIUM (PORCINE) 5000 UNIT/ML IJ SOLN
5000.0000 [IU] | Freq: Three times a day (TID) | INTRAMUSCULAR | Status: DC
  Administered 2021-12-12 – 2022-01-05 (×70): 5000 [IU] via SUBCUTANEOUS
  Filled 2021-12-11 (×71): qty 1

## 2021-12-11 MED ORDER — ACETAMINOPHEN 10 MG/ML IV SOLN
INTRAVENOUS | Status: AC
  Filled 2021-12-11: qty 100

## 2021-12-11 MED ORDER — ROCURONIUM BROMIDE 10 MG/ML (PF) SYRINGE
PREFILLED_SYRINGE | INTRAVENOUS | Status: AC
  Filled 2021-12-11: qty 10

## 2021-12-11 MED ORDER — ORAL CARE MOUTH RINSE: 15.0000 mL | Freq: Once | OROMUCOSAL | Status: AC

## 2021-12-11 MED ORDER — OXYCODONE HCL 5 MG PO TABS: 5.0000 mg | ORAL_TABLET | Freq: Once | ORAL | Status: DC | PRN

## 2021-12-11 MED ORDER — MIDAZOLAM HCL 2 MG/2ML IJ SOLN
INTRAMUSCULAR | Status: DC | PRN
  Administered 2021-12-11 (×2): 1 mg via INTRAVENOUS

## 2021-12-11 MED ORDER — 0.9 % SODIUM CHLORIDE (POUR BTL) OPTIME
TOPICAL | Status: DC | PRN
  Administered 2021-12-11: 1000 mL

## 2021-12-11 MED ORDER — BUPIVACAINE-EPINEPHRINE (PF) 0.5% -1:200000 IJ SOLN
INTRAMUSCULAR | Status: AC
  Filled 2021-12-11: qty 30

## 2021-12-11 MED ORDER — SUGAMMADEX SODIUM 200 MG/2ML IV SOLN
INTRAVENOUS | Status: DC | PRN
  Administered 2021-12-11: 500 mg via INTRAVENOUS

## 2021-12-11 MED ORDER — DEXAMETHASONE SODIUM PHOSPHATE 10 MG/ML IJ SOLN
INTRAMUSCULAR | Status: DC | PRN
  Administered 2021-12-11: 10 mg via INTRAVENOUS

## 2021-12-11 MED ORDER — PIPERACILLIN-TAZOBACTAM 3.375 G IVPB
3.3750 g | Freq: Three times a day (TID) | INTRAVENOUS | Status: DC
  Administered 2021-12-12 – 2022-01-02 (×63): 3.375 g via INTRAVENOUS
  Filled 2021-12-11 (×64): qty 50

## 2021-12-11 MED ORDER — BUPIVACAINE HCL (PF) 0.25 % IJ SOLN
INTRAMUSCULAR | Status: AC
  Filled 2021-12-11: qty 30

## 2021-12-11 MED ORDER — CHLORHEXIDINE GLUCONATE 0.12 % MT SOLN
15.0000 mL | Freq: Once | OROMUCOSAL | Status: AC
  Administered 2021-12-11: 15 mL via OROMUCOSAL

## 2021-12-11 MED ORDER — PHENYLEPHRINE HCL (PRESSORS) 10 MG/ML IV SOLN
INTRAVENOUS | Status: DC | PRN
  Administered 2021-12-11: 160 ug via INTRAVENOUS
  Administered 2021-12-11: 80 ug via INTRAVENOUS

## 2021-12-11 MED ORDER — HYDROMORPHONE HCL 1 MG/ML IJ SOLN
1.0000 mg | Freq: Once | INTRAMUSCULAR | Status: AC
  Administered 2021-12-11: 1 mg via INTRAVENOUS
  Filled 2021-12-11: qty 1

## 2021-12-11 MED ORDER — FENTANYL CITRATE (PF) 250 MCG/5ML IJ SOLN
INTRAMUSCULAR | Status: DC | PRN
  Administered 2021-12-11: 150 ug via INTRAVENOUS

## 2021-12-11 MED ORDER — LACTATED RINGERS IV SOLN: INTRAVENOUS | Status: DC | PRN

## 2021-12-11 MED ORDER — LACTATED RINGERS IV BOLUS
1000.0000 mL | Freq: Once | INTRAVENOUS | Status: AC
  Administered 2021-12-12: 1000 mL via INTRAVENOUS

## 2021-12-11 MED ORDER — SODIUM CHLORIDE 0.9 % IR SOLN
Status: DC | PRN
  Administered 2021-12-11 (×2): 1

## 2021-12-11 MED ORDER — ACETAMINOPHEN 10 MG/ML IV SOLN
INTRAVENOUS | Status: DC | PRN
  Administered 2021-12-11: 1000 mg via INTRAVENOUS

## 2021-12-11 MED ORDER — CHLORHEXIDINE GLUCONATE CLOTH 2 % EX PADS
6.0000 | MEDICATED_PAD | Freq: Every day | CUTANEOUS | Status: DC
  Administered 2021-12-11 – 2021-12-18 (×5): 6 via TOPICAL

## 2021-12-11 MED ORDER — ETOMIDATE 2 MG/ML IV SOLN
INTRAVENOUS | Status: DC | PRN
  Administered 2021-12-11: 20 mg via INTRAVENOUS

## 2021-12-11 MED ORDER — ONDANSETRON HCL 4 MG/2ML IJ SOLN
INTRAMUSCULAR | Status: DC | PRN
  Administered 2021-12-11: 4 mg via INTRAVENOUS

## 2021-12-11 MED ORDER — ALBUMIN HUMAN 5 % IV SOLN: INTRAVENOUS | Status: DC | PRN

## 2021-12-11 MED ORDER — LIDOCAINE HCL (CARDIAC) PF 100 MG/5ML IV SOSY
PREFILLED_SYRINGE | INTRAVENOUS | Status: DC | PRN
  Administered 2021-12-11: 30 mg via INTRAVENOUS

## 2021-12-11 MED ORDER — SODIUM CHLORIDE 0.9 % IV SOLN
2.0000 g | Freq: Two times a day (BID) | INTRAVENOUS | Status: DC
  Filled 2021-12-11: qty 12.5

## 2021-12-11 MED ORDER — FENTANYL CITRATE (PF) 250 MCG/5ML IJ SOLN
INTRAMUSCULAR | Status: AC
  Filled 2021-12-11: qty 5

## 2021-12-11 MED ORDER — ATORVASTATIN CALCIUM 10 MG PO TABS
20.0000 mg | ORAL_TABLET | Freq: Every day | ORAL | Status: DC
  Filled 2021-12-11: qty 2

## 2021-12-11 MED ORDER — MIDAZOLAM HCL 2 MG/2ML IJ SOLN
INTRAMUSCULAR | Status: AC
  Filled 2021-12-11: qty 2

## 2021-12-11 MED ORDER — PIPERACILLIN-TAZOBACTAM 3.375 G IVPB 30 MIN
3.3750 g | INTRAVENOUS | Status: AC
  Administered 2021-12-12: 3.375 g via INTRAVENOUS
  Filled 2021-12-11 (×2): qty 50

## 2021-12-11 MED ORDER — PROMETHAZINE HCL 25 MG/ML IJ SOLN: 6.2500 mg | INTRAMUSCULAR | Status: DC | PRN

## 2021-12-11 MED ORDER — SUCCINYLCHOLINE 20MG/ML (10ML) SYRINGE FOR MEDFUSION PUMP - OPTIME
INTRAMUSCULAR | Status: DC | PRN
  Administered 2021-12-11: 160 mg via INTRAVENOUS

## 2021-12-11 MED ORDER — LACTATED RINGERS IV BOLUS
1000.0000 mL | Freq: Once | INTRAVENOUS | Status: AC
  Administered 2021-12-11: 1000 mL via INTRAVENOUS

## 2021-12-11 MED ORDER — SODIUM CHLORIDE 0.9 % IV SOLN
2.0000 g | Freq: Once | INTRAVENOUS | Status: AC
  Administered 2021-12-11: 2 g via INTRAVENOUS
  Filled 2021-12-11: qty 12.5

## 2021-12-11 MED ORDER — MEPERIDINE HCL 25 MG/ML IJ SOLN: 6.2500 mg | INTRAMUSCULAR | Status: DC | PRN

## 2021-12-11 MED ORDER — BUPIVACAINE HCL 0.25 % IJ SOLN: INTRAMUSCULAR | Status: DC | PRN

## 2021-12-11 MED ORDER — ASPIRIN 81 MG PO CHEW
81.0000 mg | CHEWABLE_TABLET | Freq: Every day | ORAL | Status: DC
  Filled 2021-12-11: qty 1

## 2021-12-11 MED ORDER — LACTATED RINGERS IV BOLUS
1000.0000 mL | Freq: Once | INTRAVENOUS | Status: AC
  Administered 2021-12-13: 1000 mL via INTRAVENOUS

## 2021-12-11 SURGICAL SUPPLY — 56 items
BAG COUNTER SPONGE SURGICOUNT (BAG) ×2 IMPLANT
BIOPATCH RED 1 DISK 7.0 (GAUZE/BANDAGES/DRESSINGS) ×3 IMPLANT
CANISTER SUCT 3000ML PPV (MISCELLANEOUS) ×1 IMPLANT
CHLORAPREP W/TINT 26 (MISCELLANEOUS) ×2 IMPLANT
COVER SURGICAL LIGHT HANDLE (MISCELLANEOUS) ×2 IMPLANT
DERMABOND ADVANCED .7 DNX12 (GAUZE/BANDAGES/DRESSINGS) ×2 IMPLANT
DRAIN CHANNEL 19F RND (DRAIN) ×3 IMPLANT
DRAPE LAPAROSCOPIC ABDOMINAL (DRAPES) ×2 IMPLANT
DRAPE WARM FLUID 44X44 (DRAPES) ×2 IMPLANT
ELECT CAUTERY BLADE 6.4 (BLADE) ×1 IMPLANT
ELECT REM PT RETURN 9FT ADLT (ELECTROSURGICAL) ×2
ELECTRODE REM PT RTRN 9FT ADLT (ELECTROSURGICAL) ×2 IMPLANT
EVACUATOR SILICONE 100CC (DRAIN) ×3 IMPLANT
GLOVE BIO SURGEON STRL SZ7.5 (GLOVE) ×2 IMPLANT
GLOVE BIOGEL PI IND STRL 8 (GLOVE) ×3 IMPLANT
GLOVE SS BIOGEL STRL SZ 8 (GLOVE) ×1 IMPLANT
GOWN STRL REUS W/ TWL LRG LVL3 (GOWN DISPOSABLE) ×4 IMPLANT
GOWN STRL REUS W/ TWL XL LVL3 (GOWN DISPOSABLE) ×2 IMPLANT
GOWN STRL REUS W/TWL LRG LVL3 (GOWN DISPOSABLE) ×4
GOWN STRL REUS W/TWL XL LVL3 (GOWN DISPOSABLE) ×2
HANDLE SUCTION POOLE (INSTRUMENTS) ×2 IMPLANT
IRRIG SUCT STRYKERFLOW 2 WTIP (MISCELLANEOUS) ×4
IRRIGATION SUCT STRKRFLW 2 WTP (MISCELLANEOUS) ×2 IMPLANT
KIT BASIN OR (CUSTOM PROCEDURE TRAY) ×2 IMPLANT
KIT TURNOVER KIT B (KITS) ×2 IMPLANT
MANIFOLD NEPTUNE II (INSTRUMENTS) ×1 IMPLANT
NDL 22X1.5 STRL (OR ONLY) (MISCELLANEOUS) ×1 IMPLANT
NDL INSUFFLATION 14GA 120MM (NEEDLE) ×1 IMPLANT
NEEDLE 22X1.5 STRL (OR ONLY) (MISCELLANEOUS) ×2 IMPLANT
NEEDLE INSUFFLATION 14GA 120MM (NEEDLE) ×2 IMPLANT
NS IRRIG 1000ML POUR BTL (IV SOLUTION) ×4 IMPLANT
PACK GENERAL/GYN (CUSTOM PROCEDURE TRAY) ×2 IMPLANT
PAD ARMBOARD 7.5X6 YLW CONV (MISCELLANEOUS) ×4 IMPLANT
PENCIL SMOKE EVACUATOR (MISCELLANEOUS) ×2 IMPLANT
SET TUBE SMOKE EVAC HIGH FLOW (TUBING) ×2 IMPLANT
SLEEVE ENDOPATH XCEL 5M (ENDOMECHANICALS) ×2 IMPLANT
SLEEVE Z-THREAD 5X100MM (TROCAR) ×2 IMPLANT
SPONGE DRAIN TRACH 4X4 STRL 2S (GAUZE/BANDAGES/DRESSINGS) ×3 IMPLANT
STAPLER VISISTAT 35W (STAPLE) ×2 IMPLANT
SUCTION POOLE HANDLE (INSTRUMENTS) ×2
SUT ETHILON 2 0 FS 18 (SUTURE) ×3 IMPLANT
SUT MNCRL AB 4-0 PS2 18 (SUTURE) ×2 IMPLANT
SUT PDS AB 1 TP1 54 (SUTURE) ×4 IMPLANT
SUT SILK 2 0 SH CR/8 (SUTURE) ×1 IMPLANT
SUT SILK 2 0 TIES 10X30 (SUTURE) ×1 IMPLANT
SUT SILK 3 0 SH CR/8 (SUTURE) ×1 IMPLANT
SUT SILK 3 0 TIES 10X30 (SUTURE) ×1 IMPLANT
SUT VIC AB 2-0 SH 27 (SUTURE) ×10
SUT VIC AB 2-0 SH 27X BRD (SUTURE) ×5 IMPLANT
TOWEL GREEN STERILE (TOWEL DISPOSABLE) ×2 IMPLANT
TOWEL GREEN STERILE FF (TOWEL DISPOSABLE) ×2 IMPLANT
TRAY FOLEY MTR SLVR 16FR STAT (SET/KITS/TRAYS/PACK) ×2 IMPLANT
TRAY LAPAROSCOPIC MC (CUSTOM PROCEDURE TRAY) ×2 IMPLANT
TROCAR ADV FIXATION 5X100MM (TROCAR) ×1 IMPLANT
TROCAR XCEL NON-BLD 5MMX100MML (ENDOMECHANICALS) ×1 IMPLANT
WARMER LAPAROSCOPE (MISCELLANEOUS) ×2 IMPLANT

## 2021-12-11 NOTE — Transfer of Care (Signed)
Immediate Anesthesia Transfer of Care Note  Patient: [name]  Procedure(s) Performed: LAPAROSCOPIC PERITONEAL LAVAGE WITH DRAIN PLACEMENT (Abdomen)  Patient Location: PACU  Anesthesia Type:General  Level of Consciousness: awake and alert   Airway & Oxygen Therapy: Patient connected to face mask oxygen  Post-op Assessment: Report given to RN and Post -op Vital signs reviewed and stable  Post vital signs: Reviewed and stable  Last Vitals:  Vitals Value Taken Time  BP 139/93 12/11/21 2236  Temp    Pulse 80 12/11/21 2243  Resp 16 12/11/21 2243  SpO2 95 % 12/11/21 2243  Vitals shown include unvalidated device data.  Last Pain:  Vitals:   12/11/21 1853  PainSc: 7          Complications: No notable events documented.

## 2021-12-11 NOTE — Anesthesia Procedure Notes (Signed)
Procedure Name: Intubation Date/Time: 12/11/2021 8:09 PM  Performed by: Valetta Fuller, CRNAPre-anesthesia Checklist: Patient identified, Emergency Drugs available, Suction available and Patient being monitored Patient Re-evaluated:Patient Re-evaluated prior to induction Oxygen Delivery Method: Circle system utilized Preoxygenation: Pre-oxygenation with 100% oxygen Induction Type: IV induction, Rapid sequence and Cricoid Pressure applied Laryngoscope Size: Glidescope and 4 Grade View: Grade I Tube type: Oral Tube size: 7.5 mm Number of attempts: 1 Airway Equipment and Method: Stylet Placement Confirmation: ETT inserted through vocal cords under direct vision, positive ETCO2 and breath sounds checked- equal and bilateral Secured at: 23 cm Tube secured with: Tape Dental Injury: Teeth and Oropharynx as per pre-operative assessment

## 2021-12-11 NOTE — ED Notes (Signed)
Patient transported to CT 

## 2021-12-11 NOTE — ED Notes (Signed)
5 bracelets, 3 necklaces, 3 pairs of earrings, clothing (shirt, bra, pants and shoes) given to pt's brother at this time.

## 2021-12-11 NOTE — ED Triage Notes (Signed)
Pt with peri-umbilical abdominal pain and diarrhea since Friday. Pt profusely sweating since 1300 today, feeling near syncopal. In triage stating she is about to pass out and is breathing heavily. 600 NS bolus given by EMS.

## 2021-12-11 NOTE — Progress Notes (Signed)
Pharmacy Antibiotic Note  [name] is a 50 y.o. female for which pharmacy has been consulted for cefepime dosing for sepsis.  Patient with a history of HF, HTN, HLD, seizures. Patient presenting with abdominal pain.  SCr 1.9 - above baseline WBC 5.6; LA 6.1; T 97.8; HR 101; RR 19  Plan: Metronidazole per MD Cefepime 2g q12hr Trend WBC, Fever, Renal function F/u cultures, clinical course, WBC, fever De-escalate when able     Temp (24hrs), Avg:97.8 F (36.6 C), Min:97.8 F (36.6 C), Max:97.8 F (36.6 C)  No results for input(s): "WBC", "CREATININE", "LATICACIDVEN", "VANCOTROUGH", "VANCOPEAK", "VANCORANDOM", "GENTTROUGH", "GENTPEAK", "GENTRANDOM", "TOBRATROUGH", "TOBRAPEAK", "TOBRARND", "AMIKACINPEAK", "AMIKACINTROU", "AMIKACIN" in the last 168 hours.  CrCl cannot be calculated (Patient's most recent lab result is older than the maximum 21 days allowed.).    Allergies  Allergen Reactions   Savella [Milnacipran Hcl]     mania    Antimicrobials this admission: cefepime 11/13 >>  flagyl 11/13 >>  Microbiology results: Pending  Thank you for allowing pharmacy to be a part of this patient's care.  Lorelei Pont, PharmD, BCPS 12/11/2021 4:24 PM ED Clinical Pharmacist -  [phone]

## 2021-12-11 NOTE — Consult Note (Signed)
NAME:  [name], MRN:  [id number], DOB:  Jun 08, 1970, LOS: 0 ADMISSION DATE:  12/11/2021, CONSULTATION DATE:  12/11/21 REFERRING MD:  Stechschulte CHIEF COMPLAINT:  Abd pain   History of Present Illness:  [name] is a 51 y.o. female who has a PMH as below including but not limited to OSA/OHS on CPAP and followed by Dr. Elsworth Soho. She presented to [facility] ED 11/13 with abdominal pain. CTA C/A/P demonstrated free fluid and gas. She was evaluated by CCS and was taken to the OR emergently and was found to have a well formed abscess cavity in the lower mid abdomen near the sigmoid colon with purulent peritonitis and fibrinous exudate throughout the abdomen. Findings suspected to be 2/2 perforated sigmoid diverticulitis. She had a laparoscopic peritoneal lavage with drain placement performed. Initially  it was thought pt would remain intubated post op; therefore, PCCM asked to consult for vent management. However, by the time of our evaluation in PACU, pt was extubated successfully.   Pertinent  Medical History:  has OSA (obstructive sleep apnea); Obesity hypoventilation syndrome ([place]); Cardiomyopathy ([place]); Morbid obesity ([place]); Allergic rhinitis; Asthma; Hyperlipidemia; Anxiety; Bipolar disorder ([place]); Type 2 diabetes mellitus with other specified complication ([place]); OSA on CPAP; Essential hypertension, benign; Polycythemia vera ([place]); Bipolar I disorder ([place]); Depression; Anxiety state; Chronic combined systolic and diastolic CHF (congestive heart failure) ([place]); Pulmonary hypertension ([place]); Left buttock abscess; Cervical myelopathy ([place]); Educated about COVID-19 virus infection; and Observation after surgery on their problem list.  Significant Hospital Events: Including procedures, antibiotic start and stop dates in addition to other pertinent events   11/13 admit.  Interim History / Subjective:  Extubated successfully. Mouth very dry. SBP low 90s which per anesthesiologist was where  she started before the case.  Objective:  Blood pressure 98/70, pulse (!) 101, temperature 97.8 F (36.6 C), resp. rate 19, weight (!) 138.6 kg, SpO2 93 %.        Intake/Output Summary (Last 24 hours) at 12/11/2021 2243 Last data filed at 12/11/2021 2241 Gross per 24 hour  Intake 3650 ml  Output 1420 ml  Net 2230 ml   Filed Weights   12/11/21 2114  Weight: (!) 138.6 kg    Examination: General: Adult female, in PACU, extubated. Neuro: A&O x 3, no deficits. HEENT: [place]/AT. Sclerae anicteric. MM dry. NGT in place. Cardiovascular: RRR, no M/R/G.  Lungs: Respirations even and unlabored.  CTA bilaterally, No W/R/R. Abdomen: Abd JP drains x 3. BS hypoactive. Abd S/NT/ND.  Musculoskeletal: No gross deformities, no edema.  Skin: Intact, warm, no rashes.  Labs/imaging personally reviewed:  CTA C/A/P 11/13 > free air.   Assessment & Plan:   Pesumed perforated sigmoid diverticulitis with peritonitis and abscess - s/p laparascopic peritoneal lavage with drain placement. - Post op care per CCS. - Continue abx, change from Cefepime to Zosyn. - Follow cultures.  Hx OSA/OHS on CPAP at 13cm pressure per office notes. - Hold CPAP for tonight given abd surgery now with nausea + NGT. Can resume 11/14. - Bronchial hygiene.  Hypokalemia. AKI. - 40 mEq K per tube. - Additional 1L LR now then LR at 100. - Follow BMP.  Hx dCHF (Echo from Feb 2022 with EF 60-65%, G1DD), HTN, HLD - Continue home ASA, Atorvastatin. - Hold home Lasix.  Hx prediabetes. - SSI.  Hx Seizures, Bipolar disorder, depression. - Hold home Alprazolam, Adderall, Clonazepam, Lyrica, Vraylar.   Best practice (evaluated daily):  Diet/type: NPO DVT prophylaxis: prophylactic heparin  GI prophylaxis: N/A  Lines: N/A Foley:  N/A Code Status:  full code Last date of multidisciplinary goals of care discussion: None.  Labs   CBC: Recent Labs  Lab 12/11/21 1625 12/11/21 1631  WBC 5.6  --   HGB 15.0 15.6*  HCT  45.8 46.0  MCV 92.7  --   PLT 365  --     Basic Metabolic Panel: Recent Labs  Lab 12/11/21 1625 12/11/21 1631  NA 143 142  K 3.4* 3.3*  CL 105 105  CO2 20*  --   GLUCOSE 203* 201*  BUN 14 15  CREATININE 1.78* 1.90*  CALCIUM 9.0  --   MG 1.8  --    GFR: Estimated Creatinine Clearance: 49 mL/min (A) (by C-G formula based on SCr of 1.9 mg/dL (H)). Recent Labs  Lab 12/11/21 1617 12/11/21 1625  WBC  --  5.6  LATICACIDVEN 6.1*  --     Liver Function Tests: Recent Labs  Lab 12/11/21 1625  AST 33  ALT 21  ALKPHOS 109  BILITOT 0.5  PROT 6.0*  ALBUMIN 2.0*   Recent Labs  Lab 12/11/21 1625  LIPASE 21   No results for input(s): "AMMONIA" in the last 168 hours.  ABG    Component Value Date/Time   PHART 7.322 (L) 05/14/2019 1115   PCO2ART 52.6 (H) 05/14/2019 1115   PO2ART 93.0 05/14/2019 1115   HCO3 27.3 05/14/2019 1115   TCO2 19 (L) 12/11/2021 1631   ACIDBASEDEF 1.0 05/14/2019 0915   O2SAT 96.0 05/14/2019 1115     Coagulation Profile: No results for input(s): "INR", "PROTIME" in the last 168 hours.  Cardiac Enzymes: No results for input(s): "CKTOTAL", "CKMB", "CKMBINDEX", "TROPONINI" in the last 168 hours.  HbA1C: Hemoglobin A1C  Date/Time Value Ref Range Status  11/14/2021 03:28 PM 5.9 (A) 4.0 - 5.6 % Final   Hgb A1c MFr Bld  Date/Time Value Ref Range Status  05/20/2020 01:19 PM 6.6 (H) 4.6 - 6.5 % Final    Comment:    Glycemic Control Guidelines for People with Diabetes:Non Diabetic:  <6%Goal of Therapy: <7%Additional Action Suggested:  >8%   05/11/2019 02:11 PM 5.9 (H) 4.8 - 5.6 % Final    Comment:    (NOTE)         Prediabetes: 5.7 - 6.4         Diabetes: >6.4         Glycemic control for adults with diabetes: <7.0     CBG: No results for input(s): "GLUCAP" in the last 168 hours.  Review of Systems:   All negative; except for those that are bolded, which indicate positives.  Constitutional: weight loss, weight gain, night sweats,  fevers, chills, fatigue, weakness.  HEENT: headaches, sore throat, sneezing, nasal congestion, post nasal drip, difficulty swallowing, tooth/dental problems, visual complaints, visual changes, ear aches. Neuro: difficulty with speech, weakness, numbness, ataxia. CV:  chest pain, orthopnea, PND, swelling in lower extremities, dizziness, palpitations, syncope.  Resp: cough, hemoptysis, dyspnea, wheezing. GI: heartburn, indigestion, abdominal pain, nausea, vomiting, diarrhea, constipation, change in bowel habits, loss of appetite, hematemesis, melena, hematochezia.  GU: dysuria, change in color of urine, urgency or frequency, flank pain, hematuria. MSK: joint pain or swelling, decreased range of motion. Psych: change in mood or affect, depression, anxiety, suicidal ideations, homicidal ideations. Skin: rash, itching, bruising.   Past Medical History:  She,  has a past medical history of Acute systolic congestive heart failure ([place]), Agoraphobia, Anxiety, Arthritis, Asthma, Bipolar disorder ([place]), Carpal tunnel syndrome of left  wrist (05/2011), Cor pulmonale ([place]), Depression, History of pleural effusion, Hyperlipidemia, Hypertension, Hypoxemia, IBS (irritable bowel syndrome), Morbid obesity ([place]), Obesity hypoventilation syndrome ([place]), Pneumonia, Pre-diabetes, Prediabetes, Seasonal allergies, Seizures ([place]), Sleep apnea (sleep study 07/02/2010), Spinal stenosis in cervical region, and Urinary incontinence.   Surgical History:   Past Surgical History:  Procedure Laterality Date   APPLICATION OF INTRAOPERATIVE CT SCAN N/A 05/14/2019   Procedure: APPLICATION OF INTRAOPERATIVE CT SCAN;  Surgeon: Erline Levine, MD;  Location: [place];  Service: Neurosurgery;  Laterality: N/A;   CARPAL TUNNEL RELEASE  06/27/2011   Procedure: CARPAL TUNNEL RELEASE;  Surgeon: Wynonia Sours, MD;  Location: [place];  Service: Orthopedics;  Laterality: Left;   CARPAL TUNNEL RELEASE  12/19/2011   Procedure:  CARPAL TUNNEL RELEASE;  Surgeon: Wynonia Sours, MD;  Location: [place];  Service: Orthopedics;  Laterality: Right;   INCISION AND DRAINAGE ABSCESS Left 08/27/2017   Procedure: INCISION AND DRAINAGE LEFT BUTTOCK  ABSCESS;  Surgeon: Excell Seltzer, MD;  [facility];  Service: General;  Laterality: Left;   IRRIGATION AND DEBRIDEMENT BUTTOCKS Left 08/30/2017   Procedure: IRRIGATION AND DEBRIDEMENT RE-EXCISION OF LEFT SUBCUTANEOUS BUTTOCKS ABCESS;  Surgeon: Kieth Brightly Arta Bruce, MD;  [facility];  Service: General;  Laterality: Left;   MASS EXCISION  08/07/2010   right index   POSTERIOR CERVICAL FUSION/FORAMINOTOMY  09/11/2011   Procedure: POSTERIOR CERVICAL FUSION/FORAMINOTOMY LEVEL 1;  Surgeon: Erline Levine, MD;  Location: [place] [facility];  Service: Neurosurgery;  Laterality: N/A;  Cervical Three-Four Posteior Cervical Fusion and Decompression.   POSTERIOR CERVICAL FUSION/FORAMINOTOMY N/A 05/14/2019   Procedure: Posterior cervical decompression/fusion Cervical four to Thoracic one with exploration/revision of Cervical threee-four fusion;  Surgeon: Erline Levine, MD;  Location: [place];  Service: Neurosurgery;  Laterality: N/A;   TRIGGER FINGER RELEASE  12/19/2011   Procedure: RELEASE TRIGGER FINGER/A-1 PULLEY;  Surgeon: Wynonia Sours, MD;  Location: [place];  Service: Orthopedics;  Laterality: Right;   TRIGGER FINGER RELEASE Left 12/31/2012   Procedure: RELEASE A-1 PULLEY LEFT THUMB;  Surgeon: Wynonia Sours, MD;  Location: [place];  Service: Orthopedics;  Laterality: Left;   WISDOM TOOTH EXTRACTION       Social History:   reports that she has never smoked. She has never used smokeless tobacco. She reports current alcohol use. She reports that she does not use drugs.   Family History:  Her family history includes Allergic rhinitis in her mother; Asthma in her maternal grandmother and mother; Multiple sclerosis in her mother. There is no history  of Breast cancer.   Allergies Allergies  Allergen Reactions   Savella [Milnacipran Hcl] Other (See Comments)    mania     Home Medications  Prior to Admission medications   Medication Sig Start Date End Date Taking? Authorizing Provider  acetaminophen (TYLENOL) 500 MG tablet Take 1,000 mg by mouth every 6 (six) hours as needed for moderate pain.    Yes [provider]  albuterol (VENTOLIN HFA) 108 (90 Base) MCG/ACT inhaler Inhale 2 puffs into the lungs every 6 (six) hours as needed for wheezing or shortness of breath. 10/06/20  Yes Rigoberto Noel, MD  ALPRAZolam Duanne Moron) 1 MG tablet Take 1 mg by mouth 3 (three) times daily.   Yes [provider]  amphetamine-dextroamphetamine (ADDERALL) 20 MG tablet Take 20 mg by mouth in the morning, at noon, in the evening, and at bedtime.    Yes [provider]  Ascorbic Acid (VITAMIN C) 500 MG tablet Take 1,000 mg by mouth daily.   Yes [provider]  aspirin 81 MG tablet Take 1 tablet (81 mg total) by mouth daily. 05/21/19  Yes Costella, Vista Mink, PA-C  atorvastatin (LIPITOR) 20 MG tablet TAKE 1 TABLET BY MOUTH EVERY DAY IN THE EVENING Patient taking differently: Take 20 mg by mouth daily. 03/15/21  Yes Minus Breeding, MD  b complex vitamins tablet Take 1 tablet by mouth daily.   Yes [provider]  Calcium Citrate (CITRACAL PO) Take 2 tablets by mouth daily.   Yes [provider]  Cholecalciferol (VITAMIN D3) 5000 UNITS TABS Take 10,000 Units by mouth in the morning and at bedtime.   Yes [provider]  Chromium Picolinate 500 MCG TABS Take 500 mcg by mouth daily.    Yes [provider]  diclofenac Sodium (VOLTAREN) 1 % GEL Apply 2 g topically 3 (three) times daily as needed (knee pain).   Yes [provider]  furosemide (LASIX) 40 MG tablet Take 2 tablets (80 mg total) by mouth 2 (two) times daily. Patient taking differently: Take 40-80 mg by mouth See admin  instructions. Take 80 mg in the morning and 40 mg in the evening 12/05/21  Yes [name], Jeneen Rinks, MD  [name] 1.5/30 1.5-30 MG-MCG tablet Take 1 tablet by mouth at bedtime. 08/03/19  Yes [provider]  levalbuterol (XOPENEX HFA) 45 MCG/ACT inhaler Inhale 2 puffs into the lungs every 6 (six) hours as needed for wheezing. 10/14/20  Yes Rigoberto Noel, MD  lidocaine (LIDODERM) 5 % PLACE 1 PATCH ONTO THE SKIN DAILY AS NEEDED FOR KNEE PAIN. REMOVE AND DISCARD PATCH WITHIN 12 HOURS OR AS DIRECTED BY DOCTOR. Patient taking differently: Place 1 patch onto the skin as needed (knee pain). 05/22/21  Yes [place]ique, Betty G, MD  losartan (COZAAR) 25 MG tablet Take 1 tablet (25 mg total) by mouth 2 (two) times daily. 12/05/21  Yes [name], Jeneen Rinks, MD  LYRICA 300 MG capsule Take 300 mg by mouth 2 (two) times daily.  03/26/11  Yes [provider]  MAGNESIUM PO Take 500 mg by mouth daily.    Yes [provider]  Multiple Vitamin (MULTIVITAMIN) tablet Take 1 tablet by mouth daily.   Yes [provider]  NUEDEXTA 20-10 MG CAPS Take 1 capsule by mouth every 12 (twelve) hours.  09/27/16  Yes [provider]  Omega-3 Fatty Acids (FISH OIL) 1200 MG CAPS Take 2,400 mg by mouth daily.    Yes [provider]  potassium chloride SA (KLOR-CON M20) 20 MEQ tablet Take 1 tablet (20 mEq total) by mouth daily. 04/14/21  Yes [name], Jeneen Rinks, MD  VRAYLAR 6 MG CAPS Take 6 mg by mouth every evening.  09/03/16  Yes [provider]  Zinc 50 MG CAPS Take 50 mg by mouth in the morning and at bedtime.   Yes [provider]  Blood Glucose Monitoring Suppl (ACCU-CHEK AVIVA PLUS) w/Device KIT As directed. 11/14/21   [place], Betty G, MD  clonazePAM (KLONOPIN) 1 MG tablet Take 1 tablet (1 mg total) by mouth 3 (three) times daily. Patient not taking: Reported on 12/11/2021 05/06/15   Allie Bossier, MD  methocarbamol (ROBAXIN-750) 750 MG tablet Take 1 tablet (750 mg total) by mouth 3 (three)  times daily as needed for muscle spasms. Patient not taking: Reported on 12/11/2021 05/17/19   Traci Sermon, PA-C     Critical care time: N/A.  Montey Hora, [facility] For pager details, please see AMION or use Epic chat  After 1900, please call [facility] for cross coverage needs 12/11/2021, 10:43 PM

## 2021-12-11 NOTE — H&P (Signed)
Admitting Physician: Nickola Major Basilia Stuckert  Service: General Surgery  CC: Abdominal pain  Subjective   HPI: [name] is an 51 y.o. female who is here for abdominal pain.  She has had pain for the last few days.  It is located around her belly button and is severe.  She has been having bowel movements but they have been diarrhea today.    She uses naproxen daily.  Past Medical History:  Diagnosis Date   Acute systolic congestive heart failure (HCC)    Agoraphobia    Anxiety    panic attacks   Arthritis    osteoarthritis bilal. knees   Asthma    no per PFT 7/13; reports does not have asthma   Bipolar disorder ([place])    Carpal tunnel syndrome of left wrist 05/2011   Being evaluated for MS   Cor pulmonale ([place])    Depression    History of pleural effusion    Hyperlipidemia    Hypertension    Hypoxemia    history of - no home O2   IBS (irritable bowel syndrome)    Morbid obesity (HCC)    Obesity hypoventilation syndrome (HCC)    Pneumonia    Pre-diabetes    Prediabetes    Seasonal allergies    current runny nose   Seizures (HCC)    febrile seizure x 1 as a child. Several times   Sleep apnea sleep study 07/02/2010   uses CPAP nightly   Spinal stenosis in cervical region    Urinary incontinence     Past Surgical History:  Procedure Laterality Date   APPLICATION OF INTRAOPERATIVE CT SCAN N/A 05/14/2019   Procedure: APPLICATION OF INTRAOPERATIVE CT SCAN;  Surgeon: Erline Levine, MD;  Location: [place];  Service: Neurosurgery;  Laterality: N/A;   CARPAL TUNNEL RELEASE  06/27/2011   Procedure: CARPAL TUNNEL RELEASE;  Surgeon: Wynonia Sours, MD;  Location: [place];  Service: Orthopedics;  Laterality: Left;   CARPAL TUNNEL RELEASE  12/19/2011   Procedure: CARPAL TUNNEL RELEASE;  Surgeon: Wynonia Sours, MD;  Location: [place];  Service: Orthopedics;  Laterality: Right;   INCISION AND DRAINAGE ABSCESS Left 08/27/2017   Procedure:  INCISION AND DRAINAGE LEFT BUTTOCK  ABSCESS;  Surgeon: Excell Seltzer, MD;  [facility];  Service: General;  Laterality: Left;   IRRIGATION AND DEBRIDEMENT BUTTOCKS Left 08/30/2017   Procedure: IRRIGATION AND DEBRIDEMENT RE-EXCISION OF LEFT SUBCUTANEOUS BUTTOCKS ABCESS;  Surgeon: Kieth Brightly Arta Bruce, MD;  [facility];  Service: General;  Laterality: Left;   MASS EXCISION  08/07/2010   right index   POSTERIOR CERVICAL FUSION/FORAMINOTOMY  09/11/2011   Procedure: POSTERIOR CERVICAL FUSION/FORAMINOTOMY LEVEL 1;  Surgeon: Erline Levine, MD;  Location: [place] [facility];  Service: Neurosurgery;  Laterality: N/A;  Cervical Three-Four Posteior Cervical Fusion and Decompression.   POSTERIOR CERVICAL FUSION/FORAMINOTOMY N/A 05/14/2019   Procedure: Posterior cervical decompression/fusion Cervical four to Thoracic one with exploration/revision of Cervical threee-four fusion;  Surgeon: Erline Levine, MD;  Location: [place];  Service: Neurosurgery;  Laterality: N/A;   TRIGGER FINGER RELEASE  12/19/2011   Procedure: RELEASE TRIGGER FINGER/A-1 PULLEY;  Surgeon: Wynonia Sours, MD;  Location: [place];  Service: Orthopedics;  Laterality: Right;   TRIGGER FINGER RELEASE Left 12/31/2012   Procedure: RELEASE A-1 PULLEY LEFT THUMB;  Surgeon: Wynonia Sours, MD;  Location: [place];  Service: Orthopedics;  Laterality: Left;   WISDOM TOOTH EXTRACTION  Family History  Problem Relation Age of Onset   Asthma Mother    Allergic rhinitis Mother    Multiple sclerosis Mother    Asthma Maternal Grandmother    Breast cancer Neg Hx     Social:  reports that she has never smoked. She has never used smokeless tobacco. She reports current alcohol use. She reports that she does not use drugs.  Allergies:  Allergies  Allergen Reactions   Savella [Milnacipran Hcl]     mania    Medications: Current Outpatient Medications  Medication Instructions   acetaminophen (TYLENOL) 1,000 mg,  Oral, Every 6 hours PRN   albuterol (VENTOLIN HFA) 108 (90 Base) MCG/ACT inhaler 2 puffs, Inhalation, Every 6 hours PRN   ALPRAZolam (XANAX) 1 mg, Oral, 3 times daily   amphetamine-dextroamphetamine (ADDERALL) 20 MG tablet 20 mg, Oral, 4 times daily   ascorbic acid (VITAMIN C) 1,000 mg, Oral, Daily   aspirin 81 mg, Oral, Daily   atorvastatin (LIPITOR) 20 MG tablet TAKE 1 TABLET BY MOUTH EVERY DAY IN THE EVENING   b complex vitamins tablet 1 tablet, Oral, Daily   Blood Glucose Monitoring Suppl (ACCU-CHEK AVIVA PLUS) w/Device KIT As directed.   Calcium Citrate (CITRACAL PO) 2 tablets, Oral, Daily   Chromium Picolinate 500 mcg, Oral, Daily   clonazePAM (KLONOPIN) 1 mg, Oral, 3 times daily   diclofenac Sodium (VOLTAREN) 2 g, Topical, 3 times daily PRN   Fish Oil 2,400 mg, Oral, Daily   furosemide (LASIX) 80 mg, Oral, 2 times daily   [name] 1.5/30 1.5-30 MG-MCG tablet 1 tablet, Oral, Daily at bedtime   levalbuterol (XOPENEX HFA) 45 MCG/ACT inhaler 2 puffs, Inhalation, Every 6 hours PRN   lidocaine (LIDODERM) 5 % PLACE 1 PATCH ONTO THE SKIN DAILY AS NEEDED FOR KNEE PAIN. REMOVE AND DISCARD PATCH WITHIN 12 HOURS OR AS DIRECTED BY DOCTOR.   losartan (COZAAR) 25 mg, Oral, 2 times daily   Lyrica 300 mg, Oral, 2 times daily   MAGNESIUM PO 500 mg, Oral, Daily   methocarbamol (ROBAXIN-750) 750 mg, Oral, 3 times daily PRN   Multiple Vitamin (MULTIVITAMIN) tablet 1 tablet, Oral, Daily,     NUEDEXTA 20-10 MG CAPS 1 capsule, Oral, Every 12 hours   potassium chloride SA (KLOR-CON M20) 20 MEQ tablet 20 mEq, Oral, Daily   Vitamin D3 10,000 Units, Oral, 2 times daily   Vraylar 6 mg, Oral, Every evening   Zinc 50 mg, Daily    ROS - all of the below systems have been reviewed with the patient and positives are indicated with bold text General: chills, fever or night sweats Eyes: blurry vision or double vision ENT: epistaxis or sore throat Allergy/Immunology: itchy/watery eyes or nasal  congestion Hematologic/Lymphatic: bleeding problems, blood clots or swollen lymph nodes Endocrine: temperature intolerance or unexpected weight changes Breast: new or changing breast lumps or nipple discharge Resp: cough, shortness of breath, or wheezing CV: chest pain or dyspnea on exertion GI: as per HPI GU: dysuria, trouble voiding, or hematuria MSK: joint pain or joint stiffness Neuro: TIA or stroke symptoms Derm: pruritus and skin lesion changes Psych: anxiety and depression  Objective   PE Blood pressure 98/72, pulse 96, temperature 97.8 F (36.6 C), resp. rate (!) 21, SpO2 96 %. Constitutional: NAD; conversant; no deformities Eyes: Moist conjunctiva; no lid lag; anicteric; PERRL Neck: Trachea midline; no thyromegaly Lungs: Normal respiratory effort; no tactile fremitus CV: RRR; no palpable thrills; no pitting edema GI: Abd Diffuse abdominal tenderness; no palpable hepatosplenomegaly  MSK: Normal range of motion of extremities; no clubbing/cyanosis Psychiatric: Appropriate affect; alert and oriented x3 Lymphatic: No palpable cervical or axillary lymphadenopathy  Results for orders placed or performed during the hospital encounter of 12/11/21 (from the past 24 hour(s))  Lactic acid, plasma     Status: Abnormal   Collection Time: 12/11/21  4:17 PM  Result Value Ref Range   Lactic Acid, Venous 6.1 (HH) 0.5 - 1.9 mmol/L  Lipase, blood     Status: None   Collection Time: 12/11/21  4:25 PM  Result Value Ref Range   Lipase 21 11 - 51 U/L  Comprehensive metabolic panel     Status: Abnormal   Collection Time: 12/11/21  4:25 PM  Result Value Ref Range   Sodium 143 135 - 145 mmol/L   Potassium 3.4 (L) 3.5 - 5.1 mmol/L   Chloride 105 98 - 111 mmol/L   CO2 20 (L) 22 - 32 mmol/L   Glucose, Bld 203 (H) 70 - 99 mg/dL   BUN 14 6 - 20 mg/dL   Creatinine, Ser 1.78 (H) 0.44 - 1.00 mg/dL   Calcium 9.0 8.9 - 10.3 mg/dL   Total Protein 6.0 (L) 6.5 - 8.1 g/dL   Albumin 2.0 (L) 3.5 - 5.0  g/dL   AST 33 15 - 41 U/L   ALT 21 0 - 44 U/L   Alkaline Phosphatase 109 38 - 126 U/L   Total Bilirubin 0.5 0.3 - 1.2 mg/dL   GFR, Estimated 34 (L) >60 mL/min   Anion gap 18 (H) 5 - 15  CBC     Status: None   Collection Time: 12/11/21  4:25 PM  Result Value Ref Range   WBC 5.6 4.0 - 10.5 K/uL   RBC 4.94 3.87 - 5.11 MIL/uL   Hemoglobin 15.0 12.0 - 15.0 g/dL   HCT 45.8 36.0 - 46.0 %   MCV 92.7 80.0 - 100.0 fL   MCH 30.4 26.0 - 34.0 pg   MCHC 32.8 30.0 - 36.0 g/dL   RDW 13.9 11.5 - 15.5 %   Platelets 365 150 - 400 K/uL   nRBC 0.0 0.0 - 0.2 %  Magnesium     Status: None   Collection Time: 12/11/21  4:25 PM  Result Value Ref Range   Magnesium 1.8 1.7 - 2.4 mg/dL  Troponin I (High Sensitivity)     Status: Abnormal   Collection Time: 12/11/21  4:25 PM  Result Value Ref Range   Troponin I (High Sensitivity) 82 (H) <18 ng/L  I-Stat beta hCG blood, ED     Status: Abnormal   Collection Time: 12/11/21  4:28 PM  Result Value Ref Range   I-stat hCG, quantitative 43.9 (H) <5 mIU/mL   Comment 3          I-stat chem 8, ED (not at [facility] or [facility])     Status: Abnormal   Collection Time: 12/11/21  4:31 PM  Result Value Ref Range   Sodium 142 135 - 145 mmol/L   Potassium 3.3 (L) 3.5 - 5.1 mmol/L   Chloride 105 98 - 111 mmol/L   BUN 15 6 - 20 mg/dL   Creatinine, Ser 1.90 (H) 0.44 - 1.00 mg/dL   Glucose, Bld 201 (H) 70 - 99 mg/dL   Calcium, Ion 1.13 (L) 1.15 - 1.40 mmol/L   TCO2 19 (L) 22 - 32 mmol/L   Hemoglobin 15.6 (H) 12.0 - 15.0 g/dL   HCT 46.0 36.0 - 46.0 %  Imaging Orders         CT Angio Chest/Abd/Pel for Dissection W and/or Wo Contrast      Assessment and Plan   LAVILLA DELAMORA is an 51 y.o. female who presented with abdominal pain, found on CT scan to have pneumoperitoneum.  I recommend diagnostic laparoscopy, possible exploratory laparotomy, possible ostomy, possible bowel resection.  The procedure itself as well as its risk, benefits, and alternatives were discussed  the patient in full.  We discussed the need for possible prolonged intubation with her history of obesity hypoventilation syndrome.  The surgical risk calculator was used.  Please see below.  After full discussion all questions answered the patient granted consent to proceed.  We will proceed to the operating room emergently.  ACS RISK CALCULATOR USE:  Risk Calculator was used for discussion of surgery: Yes       Felicie Morn, MD  [facility] Use [email or web address] to contact on call provider  New Patient Billing: [phone] - High MDM

## 2021-12-11 NOTE — Progress Notes (Signed)
Pharmacy Antibiotic Note  [name] is a 50 y.o. female admitted on 12/11/2021 with intra-abdominal abscess now s/p peritoneal lavage with drain placement.  Pharmacy has been consulted for Zosyn dosing.  Plan: Zosyn 3.375g IV q8h (4 hour infusion).  Weight: (!) 138.6 kg (305 lb 8.9 oz)  Temp (24hrs), Avg:97.5 F (36.4 C), Min:97.4 F (36.3 C), Max:97.8 F (36.6 C)  Recent Labs  Lab 12/11/21 1617 12/11/21 1625 12/11/21 1631  WBC  --  5.6  --   CREATININE  --  1.78* 1.90*  LATICACIDVEN 6.1*  --   --     Estimated Creatinine Clearance: 49 mL/min (A) (by C-G formula based on SCr of 1.9 mg/dL (H)).    Allergies  Allergen Reactions   Savella [Milnacipran Hcl] Other (See Comments)    mania    Thank you for allowing pharmacy to be a part of this patient's care.  Wynona Neat, PharmD, BCPS  12/11/2021 11:24 PM

## 2021-12-11 NOTE — ED Notes (Signed)
Pt was not able to provide a urine sample at this time.  Spoke to Dr. Doren Custard and ok to go ahead and give antibiotics without urine sample.

## 2021-12-11 NOTE — Anesthesia Preprocedure Evaluation (Addendum)
Anesthesia Evaluation  Patient identified by MRN, date of birth, ID band Patient awake    Reviewed: Allergy & Precautions, NPO status , Patient's Chart, lab work & pertinent test results  History of Anesthesia Complications Negative for: history of anesthetic complications  Airway Mallampati: I  TM Distance: >3 FB Neck ROM: Full    Dental  (+) Chipped, Dental Advisory Given   Pulmonary asthma , sleep apnea and Continuous Positive Airway Pressure Ventilation , COPD,  COPD inhaler   breath sounds clear to auscultation       Cardiovascular hypertension, Pt. on medications (-) angina +CHF (cor pulmonale)   Rhythm:Regular Rate:Normal  '22 ECHO: EF 60-65%. The LV has normal function, no regional wall motion abnormalities. There is mild concentric LVH. Grade I DD, aortic sclerosis without stenosis    Neuro/Psych   Anxiety Depression Bipolar Disorder   negative neurological ROS     GI/Hepatic Neg liver ROS, PUD,,,  Endo/Other    Morbid obesityGlu 201  Renal/GU Renal InsufficiencyRenal disease     Musculoskeletal  (+) Arthritis ,    Abdominal  (+) + obese  Peds  Hematology negative hematology ROS (+) Pt states she accepts blood products if necessary   Anesthesia Other Findings   Reproductive/Obstetrics                             Anesthesia Physical Anesthesia Plan  ASA: 4 and emergent  Anesthesia Plan: General   Post-op Pain Management: Ofirmev IV (intra-op)*   Induction: Intravenous and Rapid sequence  PONV Risk Score and Plan: 3 and Ondansetron and Dexamethasone  Airway Management Planned: Oral ETT and Video Laryngoscope Planned  Additional Equipment: None  Intra-op Plan:   Post-operative Plan: Possible Post-op intubation/ventilation  Informed Consent: I have reviewed the patients History and Physical, chart, labs and discussed the procedure including the risks, benefits and  alternatives for the proposed anesthesia with the patient or authorized representative who has indicated his/her understanding and acceptance.     Dental advisory given  Plan Discussed with: CRNA and Surgeon  Anesthesia Plan Comments:         Anesthesia Quick Evaluation

## 2021-12-11 NOTE — Anesthesia Postprocedure Evaluation (Signed)
Anesthesia Post Note  Patient: [name]  Procedure(s) Performed: LAPAROSCOPIC PERITONEAL LAVAGE WITH DRAIN PLACEMENT (Abdomen)     Patient location during evaluation: PACU Anesthesia Type: General Level of consciousness: awake and alert, patient cooperative and oriented Pain management: pain level controlled Vital Signs Assessment: post-procedure vital signs reviewed and stable Respiratory status: spontaneous breathing, nonlabored ventilation, respiratory function stable and patient connected to nasal cannula oxygen Cardiovascular status: blood pressure returned to baseline and stable Postop Assessment: no apparent nausea or vomiting Anesthetic complications: no   No notable events documented.  Last Vitals:  Vitals:   12/11/21 2236 12/11/21 2245  BP: (!) 139/93 (!) 95/53  Pulse:  82  Resp: 16 18  Temp: (!) 36.3 C   SpO2: 96% 93%    Last Pain:  Vitals:   12/11/21 2236  PainSc: 0-No pain                 [name],E. Tyeisha Dinan

## 2021-12-11 NOTE — Op Note (Signed)
Patient: [name] (15-Nov-1970, [id number])  Date of Surgery: 12/11/2021   Preoperative Diagnosis: ULCER   Postoperative Diagnosis: ULCER   Surgical Procedure: LAPAROSCOPIC PERITONEAL LAVAGE WITH DRAIN PLACEMENT: 59935 (CPT)   Operative Team Members:  Surgeon(s) and Role:    * Jaydien Panepinto, Nickola Major, MD - Primary   Anesthesiologist: Annye Asa, MD CRNA: Valetta Fuller, CRNA   Anesthesia: General   Fluids:  Total I/O In: 2950 [I.V.:2200; IV Piggyback:750] Out: 7017 [Other:1400; BLTJQ:30]  Complications: * No complications entered in OR log *  Drains:  Three (19 Fr) Jackson-Pratt drain(s) with closed bulb suction  RUQ Drain: Right pericolic gutter into pelvis  Left mid abdominal drain: pelvis  LUQ Drain: Suprahepatic  Specimen:  ID Type Source Tests Collected by Time Destination  A : Peritoneal fluid Body Fluid PATH Cytology Peritoneal fluid FUNGUS CULTURE WITH STAIN, AEROBIC/ANAEROBIC CULTURE W GRAM STAIN (SURGICAL/DEEP WOUND) Izael Bessinger, Nickola Major, MD 12/11/2021 2103      Disposition:  PACU - hemodynamically stable.  Plan of Care: Admit to inpatient     Indications for Procedure: [name] is a 51 y.o. female who presented with abdominal pain, on CT was found to have pneumoperitoneum and free fluid.  I recommended diagnostic laparoscopy, possible exploratory laparotomy, possible bowel resection, possible ostomy.    The procedure itself as well as its risks, benefits and alternatives were discussed.  The risks discussed included but were not limited to the risk of infection, bleeding, damage to nearby structures, and need for additional surgeries.  After a full discussion and all questions answered the patient granted consent to proceed.  Findings: Well formed abscess cavity in the lower mid abdomen near the sigmoid colon, purulent peritonitis with fibrinous exudate throughout the abdomen.  No clear source of the infection, though perforated  sigmoid diverticulitis is the most likely culprit.   Description of Procedure:   On the date stated above the patient taken the operating room suite and placed in supine position.  General endotracheal anesthesia was induced.  A timeout was completed verifying the correct patient, procedure, positioning, and equipment needed for the case.  The patient's abdomen was prepped and draped in usual sterile fashion.  Antibiotics were given prior to the case start.  I began by making a 5 mm incision just under the left rib cage.  I inserted a 5 mm trocar into the abdomen and inflated the abdomen to 15 mmHg.  There is no trauma the underlying viscera with initial trocar placement.  3 additional trocars were placed across the mid abdomen.  The abdomen was explored laparoscopically and I use a suction irrigator to clean out the abdomen as best as possible.  Some of the purulent fluid was sent for culture.  There was purulence throughout the abdomen with fibrinous exudate throughout the abdomen.  The duodenum was inspected very closely, I could not identify any hole, there was no pooling of bile in this area.  There was a large amount of fluid coming out of the NG tube, multiple liters.  The omentum was lifted off the small bowel and the visible small bowel appeared secondarily inflamed but normal.  The pelvis was explored.  A massive abscess cavity was identified in the lower mid abdomen close to the sigmoid colon.  This was suctioned clean.  The sigmoid colon was inspected.  A very short segment of sigmoid colon was able to be visualized and did not appear to have diverticular disease in that area, however there  were many inflamed epiploica in the sigmoid colon was not able to be fully visualized down to the rectum.  However there was no additional purulence draining out of the pelvis or sigmoid colon area.  At this point I called my partner Dr. Grandville Silos into the room.  We discussed the case and options moving forward  in detail.  We felt the morbidity of a laparotomy and likely Hartman's procedure with ostomy would be very high in this morbidly obese patient.  Create an ostomy would be technically quite difficult.  The patient is currently suboptimally resuscitated for a large operation such as this, and there is a chance she might improve with drains and antibiotics alone.  We elected to place drains and avoid the laparotomy.  Three 39 French JP drains were placed.  A right upper quadrant drain was placed down the right paracolic gutter and into the pelvis.  A left upper quadrant drain was placed above the liver.  A left mid abdominal drain was placed in the pelvis.  The abdomen was desufflated.  A 4-0 Monocryl was used to close the remaining 5 mm trocar site.  Dermabond was applied here.  Drain sponges were applied.  The patient was woken from anesthesia and transferred the postanesthesia care unit in stable condition.  All sponge and needle counts were correct at the end of this case.  At the end of the case we reviewed the infection status of the case. Patient: [name] Emergency General Surgery Service Patient Case: Emergent Infection Present At Time Of Surgery (PATOS): Purulent Peritonitis  Jessica Raw, MD General, Bariatric, & Minimally Invasive Surgery [facility], [place]

## 2021-12-11 NOTE — ED Provider Notes (Signed)
[facility] EMERGENCY DEPARTMENT Provider Note   CSN: [id number] Arrival date & time: 12/11/21  1557     History {Add pertinent medical, surgical, social history, OB history to HPI:1} Chief Complaint  Patient presents with   Abdominal Pain    [name] is a 51 y.o. female.   Abdominal Pain Associated symptoms: chills, diarrhea, fatigue, fever and shortness of breath   Patient presents for abdominal pain.  Medical history includes OSA, obesity, asthma, HLD, anxiety, bipolar disorder, T2DM, depression, pulmonary hypertension, CHF, seizures.  She developed periumbilical abdominal pain and diarrhea 3 days ago.  Today, she developed a low-grade fever.  She also had worsening severity of pain, abdominal distention, diaphoresis and shortness of breath starting this afternoon.  She also endorses near syncopal symptoms.  600 cc IVF given prior to arrival by EMS.  She has not eaten since yesterday afternoon due to loss of appetite.  She denies any nausea or vomiting.  Her diarrhea has resolved.  Her last bowel movement was yesterday.  She denies any recent blood or melena.  She attributes her shortness of breath due to the severe pain.  Currently, she states her pain is 7.5/10 in severity.  Yesterday it was 4/10.  She feels that her abdomen is distended.  She denies any history of abdominal surgeries.     Home Medications Prior to Admission medications   Medication Sig Start Date End Date Taking? Authorizing Provider  acetaminophen (TYLENOL) 500 MG tablet Take 1,000 mg by mouth every 6 (six) hours as needed for moderate pain.     [provider]  albuterol (VENTOLIN HFA) 108 (90 Base) MCG/ACT inhaler Inhale 2 puffs into the lungs every 6 (six) hours as needed for wheezing or shortness of breath. 10/06/20   Rigoberto Noel, MD  ALPRAZolam Duanne Moron) 1 MG tablet Take 1 mg by mouth 3 (three) times daily.    [provider]  amphetamine-dextroamphetamine  (ADDERALL) 20 MG tablet Take 20 mg by mouth in the morning, at noon, in the evening, and at bedtime.     [provider]  Ascorbic Acid (VITAMIN C) 500 MG tablet Take 1,000 mg by mouth daily.    [provider]  aspirin 81 MG tablet Take 1 tablet (81 mg total) by mouth daily. 05/21/19   Costella, Vista Mink, PA-C  atorvastatin (LIPITOR) 20 MG tablet TAKE 1 TABLET BY MOUTH EVERY DAY IN THE EVENING 03/15/21   Minus Breeding, MD  b complex vitamins tablet Take 1 tablet by mouth daily.    [provider]  Blood Glucose Monitoring Suppl (ACCU-CHEK AVIVA PLUS) w/Device KIT As directed. 11/14/21   Martinique, Betty G, MD  Calcium Citrate (CITRACAL PO) Take 2 tablets by mouth daily.    [provider]  Cholecalciferol (VITAMIN D3) 5000 UNITS TABS Take 10,000 Units by mouth in the morning and at bedtime.    [provider]  Chromium Picolinate 500 MCG TABS Take 500 mcg by mouth daily.     [provider]  clonazePAM (KLONOPIN) 1 MG tablet Take 1 tablet (1 mg total) by mouth 3 (three) times daily. Patient taking differently: Take 1 mg by mouth in the morning, at noon, in the evening, and at bedtime. 05/06/15   Allie Bossier, MD  diclofenac Sodium (VOLTAREN) 1 % GEL Apply 2 g topically 3 (three) times daily as needed (knee pain).    [provider]  furosemide (LASIX) 40 MG tablet Take 2 tablets (80  mg total) by mouth 2 (two) times daily. 12/05/21   Minus Breeding, MD  [name] 1.5/30 1.5-30 MG-MCG tablet Take 1 tablet by mouth at bedtime. 08/03/19   [provider]  levalbuterol Penne Lash HFA) 45 MCG/ACT inhaler Inhale 2 puffs into the lungs every 6 (six) hours as needed for wheezing. 10/14/20   Rigoberto Noel, MD  lidocaine (LIDODERM) 5 % PLACE 1 PATCH ONTO THE SKIN DAILY AS NEEDED FOR KNEE PAIN. REMOVE AND DISCARD PATCH WITHIN 12 HOURS OR AS DIRECTED BY DOCTOR. 05/22/21   Martinique, Betty G, MD  losartan (COZAAR) 25 MG tablet Take 1 tablet (25 mg total)  by mouth 2 (two) times daily. 12/05/21   Minus Breeding, MD  LYRICA 300 MG capsule Take 300 mg by mouth 2 (two) times daily.  03/26/11   [provider]  MAGNESIUM PO Take 500 mg by mouth daily.     [provider]  methocarbamol (ROBAXIN-750) 750 MG tablet Take 1 tablet (750 mg total) by mouth 3 (three) times daily as needed for muscle spasms. 05/17/19   Costella, Vista Mink, PA-C  Multiple Vitamin (MULTIVITAMIN) tablet Take 1 tablet by mouth daily.    [provider]  NUEDEXTA 20-10 MG CAPS Take 1 capsule by mouth every 12 (twelve) hours.  09/27/16   [provider]  Omega-3 Fatty Acids (FISH OIL) 1200 MG CAPS Take 2,400 mg by mouth daily.     [provider]  potassium chloride SA (KLOR-CON M20) 20 MEQ tablet Take 1 tablet (20 mEq total) by mouth daily. 04/14/21   Minus Breeding, MD  VRAYLAR 6 MG CAPS Take 6 mg by mouth every evening.  09/03/16   [provider]  Zinc 50 MG CAPS Take 50 mg by mouth daily.    [provider]      Allergies    Elwyn Reach hcl]    Review of Systems   Review of Systems  Constitutional:  Positive for appetite change, chills, diaphoresis, fatigue and fever.  Respiratory:  Positive for shortness of breath.   Gastrointestinal:  Positive for abdominal distention, abdominal pain and diarrhea.  Neurological:  Positive for dizziness and light-headedness.  All other systems reviewed and are negative.   Physical Exam Updated Vital Signs Pulse (!) 127   Temp 97.8 F (36.6 C)   Resp 16   SpO2 96%  Physical Exam Vitals and nursing note reviewed.  Constitutional:      General: She is not in acute distress.    Appearance: She is well-developed. She is obese. She is ill-appearing and diaphoretic.  HENT:     Head: Normocephalic and atraumatic.     Mouth/Throat:     Mouth: Mucous membranes are moist.     Pharynx: Oropharynx is clear.  Eyes:     General: No scleral icterus.     Conjunctiva/sclera: Conjunctivae normal.  Cardiovascular:     Rate and Rhythm: Regular rhythm. Tachycardia present.     Heart sounds: No murmur heard. Pulmonary:     Effort: Pulmonary effort is normal. No respiratory distress.     Breath sounds: Normal breath sounds. No wheezing or rales.  Chest:     Chest wall: No tenderness.  Abdominal:     General: There is distension.     Palpations: Abdomen is soft.     Tenderness: There is generalized abdominal tenderness. There is no guarding or rebound.  Musculoskeletal:        General: No swelling.     Cervical  back: Neck supple.  Skin:    General: Skin is warm.     Coloration: Skin is pale. Skin is not cyanotic or jaundiced.  Neurological:     General: No focal deficit present.     Mental Status: She is alert and oriented to person, place, and time.  Psychiatric:        Mood and Affect: Mood normal.        Behavior: Behavior normal.     ED Results / Procedures / Treatments   Labs (all labs ordered are listed, but only abnormal results are displayed) Labs Reviewed  LIPASE, BLOOD  COMPREHENSIVE METABOLIC PANEL  CBC  URINALYSIS, ROUTINE W REFLEX MICROSCOPIC    EKG None  Radiology No results found.  Procedures Procedures  {Document cardiac monitor, telemetry assessment procedure when appropriate:1}  Medications Ordered in ED Medications - No data to display  ED Course/ Medical Decision Making/ A&P                           Medical Decision Making  This patient presents to the ED for concern of ***, this involves an extensive number of treatment options, and is a complaint that carries with it a high risk of complications and morbidity.  The differential diagnosis includes ***   Co morbidities that complicate the patient evaluation  ***   Additional history obtained:  Additional history obtained from *** External records from outside source obtained and reviewed including ***   Lab Tests:  I Ordered, and  personally interpreted labs.  The pertinent results include:  ***   Imaging Studies ordered:  I ordered imaging studies including ***  I independently visualized and interpreted imaging which showed *** I agree with the radiologist interpretation   Cardiac Monitoring: / EKG:  The patient was maintained on a cardiac monitor.  I personally viewed and interpreted the cardiac monitored which showed an underlying rhythm of: ***   Consultations Obtained:  I requested consultation with the ***,  and discussed lab and imaging findings as well as pertinent plan - they recommend: ***   Problem List / ED Course / Critical interventions / Medication management  *** I ordered medication including ***  for ***  Reevaluation of the patient after these medicines showed that the patient {resolved/improved/worsened:23923::"improved"} I have reviewed the patients home medicines and have made adjustments as needed   Social Determinants of Health:  ***   Test / Admission - Considered:  ***   {Document critical care time when appropriate:1} {Document review of labs and clinical decision tools ie heart score, Chads2Vasc2 etc:1}  {Document your independent review of radiology images, and any outside records:1} {Document your discussion with family members, caretakers, and with consultants:1} {Document social determinants of health affecting pt's care:1} {Document your decision making why or why not admission, treatments were needed:1} Final Clinical Impression(s) / ED Diagnoses Final diagnoses:  None    Rx / DC Orders ED Discharge Orders     None

## 2021-12-12 ENCOUNTER — Ambulatory Visit: Payer: Self-pay | Admitting: Podiatry

## 2021-12-12 ENCOUNTER — Ambulatory Visit: Payer: Medicare Other | Admitting: Psychology

## 2021-12-12 ENCOUNTER — Encounter (HOSPITAL_COMMUNITY): Payer: Self-pay | Admitting: Surgery

## 2021-12-12 DIAGNOSIS — E872 Acidosis, unspecified: Secondary | ICD-10-CM

## 2021-12-12 DIAGNOSIS — K529 Noninfective gastroenteritis and colitis, unspecified: Secondary | ICD-10-CM | POA: Diagnosis not present

## 2021-12-12 DIAGNOSIS — K668 Other specified disorders of peritoneum: Secondary | ICD-10-CM

## 2021-12-12 LAB — URINALYSIS, ROUTINE W REFLEX MICROSCOPIC
Bilirubin Urine: NEGATIVE
Glucose, UA: NEGATIVE mg/dL
Hgb urine dipstick: NEGATIVE
Ketones, ur: NEGATIVE mg/dL
Leukocytes,Ua: NEGATIVE
Nitrite: NEGATIVE
Protein, ur: 100 mg/dL — AB
Specific Gravity, Urine: 1.015 (ref 1.005–1.030)
pH: 5.5 (ref 5.0–8.0)

## 2021-12-12 LAB — URINALYSIS, MICROSCOPIC (REFLEX)
RBC / HPF: NONE SEEN RBC/hpf (ref 0–5)
Squamous Epithelial / HPF: NONE SEEN (ref 0–5)

## 2021-12-12 LAB — BASIC METABOLIC PANEL
Anion gap: 16 — ABNORMAL HIGH (ref 5–15)
Anion gap: 13 (ref 5–15)
BUN: 15 mg/dL (ref 6–20)
BUN: 17 mg/dL (ref 6–20)
CO2: 24 mmol/L (ref 22–32)
CO2: 28 mmol/L (ref 22–32)
Calcium: 9.1 mg/dL (ref 8.9–10.3)
Calcium: 8.8 mg/dL — ABNORMAL LOW (ref 8.9–10.3)
Chloride: 100 mmol/L (ref 98–111)
Chloride: 101 mmol/L (ref 98–111)
Creatinine, Ser: 1.96 mg/dL — ABNORMAL HIGH (ref 0.44–1.00)
Creatinine, Ser: 1.76 mg/dL — ABNORMAL HIGH (ref 0.44–1.00)
GFR, Estimated: 30 mL/min — ABNORMAL LOW (ref >60)
GFR, Estimated: 34 mL/min — ABNORMAL LOW (ref >60)
Glucose, Bld: 151 mg/dL — ABNORMAL HIGH (ref 70–99)
Glucose, Bld: 152 mg/dL — ABNORMAL HIGH (ref 70–99)
Potassium: 4.1 mmol/L (ref 3.5–5.1)
Potassium: 4.4 mmol/L (ref 3.5–5.1)
Sodium: 140 mmol/L (ref 135–145)
Sodium: 142 mmol/L (ref 135–145)

## 2021-12-12 LAB — CBC
HCT: 38.5 % (ref 36.0–46.0)
HCT: 38.5 % (ref 36.0–46.0)
Hemoglobin: 13.2 g/dL (ref 12.0–15.0)
Hemoglobin: 13.4 g/dL (ref 12.0–15.0)
MCH: 31.1 pg (ref 26.0–34.0)
MCH: 30.9 pg (ref 26.0–34.0)
MCHC: 34.3 g/dL (ref 30.0–36.0)
MCHC: 34.8 g/dL (ref 30.0–36.0)
MCV: 90.8 fL (ref 80.0–100.0)
MCV: 88.7 fL (ref 80.0–100.0)
Platelets: 281 10*3/uL (ref 150–400)
Platelets: 308 10*3/uL (ref 150–400)
RBC: 4.24 MIL/uL (ref 3.87–5.11)
RBC: 4.34 MIL/uL (ref 3.87–5.11)
RDW: 14.2 % (ref 11.5–15.5)
RDW: 13.9 % (ref 11.5–15.5)
WBC: 13.6 10*3/uL — ABNORMAL HIGH (ref 4.0–10.5)
WBC: 18.8 10*3/uL — ABNORMAL HIGH (ref 4.0–10.5)
nRBC: 0 % (ref 0.0–0.2)
nRBC: 0 % (ref 0.0–0.2)

## 2021-12-12 LAB — GLUCOSE, CAPILLARY
Glucose-Capillary: 156 mg/dL — ABNORMAL HIGH (ref 70–99)
Glucose-Capillary: 171 mg/dL — ABNORMAL HIGH (ref 70–99)
Glucose-Capillary: 149 mg/dL — ABNORMAL HIGH (ref 70–99)
Glucose-Capillary: 142 mg/dL — ABNORMAL HIGH (ref 70–99)
Glucose-Capillary: 144 mg/dL — ABNORMAL HIGH (ref 70–99)
Glucose-Capillary: 121 mg/dL — ABNORMAL HIGH (ref 70–99)

## 2021-12-12 LAB — TROPONIN I (HIGH SENSITIVITY): Troponin I (High Sensitivity): 59 ng/L — ABNORMAL HIGH (ref <18)

## 2021-12-12 LAB — LACTIC ACID, PLASMA: Lactic Acid, Venous: 2.5 mmol/L (ref 0.5–1.9)

## 2021-12-12 LAB — PHOSPHORUS: Phosphorus: 4.6 mg/dL (ref 2.5–4.6)

## 2021-12-12 LAB — MAGNESIUM: Magnesium: 1.7 mg/dL (ref 1.7–2.4)

## 2021-12-12 LAB — MRSA NEXT GEN BY PCR, NASAL: MRSA by PCR Next Gen: NOT DETECTED

## 2021-12-12 MED ORDER — FENTANYL CITRATE PF 50 MCG/ML IJ SOSY
12.5000 ug | PREFILLED_SYRINGE | Freq: Once | INTRAMUSCULAR | Status: AC
  Administered 2021-12-12: 12.5 ug via INTRAVENOUS
  Filled 2021-12-12: qty 1

## 2021-12-12 MED ORDER — IPRATROPIUM-ALBUTEROL 0.5-2.5 (3) MG/3ML IN SOLN: 3.0000 mL | RESPIRATORY_TRACT | Status: DC | PRN

## 2021-12-12 MED ORDER — ORAL CARE MOUTH RINSE: 15.0000 mL | OROMUCOSAL | Status: DC | PRN

## 2021-12-12 MED ORDER — LORAZEPAM 2 MG/ML IJ SOLN
0.5000 mg | Freq: Three times a day (TID) | INTRAMUSCULAR | Status: DC | PRN
  Administered 2021-12-12 – 2021-12-13 (×3): 0.5 mg via INTRAVENOUS
  Filled 2021-12-12 (×3): qty 1

## 2021-12-12 MED ORDER — MAGNESIUM SULFATE 2 GM/50ML IV SOLN
2.0000 g | Freq: Once | INTRAVENOUS | Status: AC
  Administered 2021-12-12: 2 g via INTRAVENOUS
  Filled 2021-12-12: qty 50

## 2021-12-12 MED ORDER — ORAL CARE MOUTH RINSE
15.0000 mL | OROMUCOSAL | Status: DC
  Administered 2021-12-12 – 2022-01-04 (×66): 15 mL via OROMUCOSAL

## 2021-12-12 MED ORDER — ACETAMINOPHEN 10 MG/ML IV SOLN
1000.0000 mg | Freq: Four times a day (QID) | INTRAVENOUS | Status: AC | PRN
  Administered 2021-12-12 (×2): 1000 mg via INTRAVENOUS
  Filled 2021-12-12 (×2): qty 100

## 2021-12-12 MED ORDER — PANTOPRAZOLE SODIUM 40 MG IV SOLR
40.0000 mg | Freq: Two times a day (BID) | INTRAVENOUS | Status: DC
  Administered 2021-12-12 – 2021-12-17 (×12): 40 mg via INTRAVENOUS
  Filled 2021-12-12 (×12): qty 10

## 2021-12-12 MED ORDER — FENTANYL CITRATE PF 50 MCG/ML IJ SOSY
12.5000 ug | PREFILLED_SYRINGE | INTRAMUSCULAR | Status: AC | PRN
  Administered 2021-12-12 (×3): 25 ug via INTRAVENOUS
  Filled 2021-12-12 (×3): qty 1

## 2021-12-12 MED ORDER — FENTANYL CITRATE PF 50 MCG/ML IJ SOSY
25.0000 ug | PREFILLED_SYRINGE | Freq: Once | INTRAMUSCULAR | Status: AC
  Administered 2021-12-12: 25 ug via INTRAVENOUS
  Filled 2021-12-12: qty 1

## 2021-12-12 NOTE — Progress Notes (Signed)
Patient with continued c/o abdominal pain. Pain 8/10 S/P Laparoscopic surgery for abscess. [place] notified.   Patient stoic. No other objective S/S  of pain.   Awaiting response from MD.

## 2021-12-12 NOTE — Progress Notes (Signed)
General Surgery Follow Up Note  Subjective:    Overnight Issues:   Objective:  Vital signs for last 24 hours: Temp:  [97.4 F (36.3 C)-99 F (37.2 C)] 97.8 F (36.6 C) (11/14 1113) Pulse Rate:  [72-127] 80 (11/14 1000) Resp:  [15-29] 21 (11/14 1000) BP: (81-139)/(37-93) 137/56 (11/14 1000) SpO2:  [88 %-96 %] 92 % (11/14 1000) Weight:  [138.6 kg] 138.6 kg (11/13 2114)  Hemodynamic parameters for last 24 hours:    Intake/Output from previous day: 11/13 0701 - 11/14 0700 In: 5249.2 [I.V.:3381; IV Piggyback:1868.2] Out: 2940 [Urine:250; Emesis/NG output:950; Drains:320; Blood:20]  Intake/Output this shift: Total I/O In: 325.4 [I.V.:300; IV Piggyback:25.4] Out: 205 [Urine:120; Drains:85]  Vent settings for last 24 hours:    Physical Exam:  Gen: comfortable, no distress Neuro: non-focal exam HEENT: PERRL Neck: supple CV: RRR Pulm: unlabored breathing Abd: soft, NT, incisions cdi, drains serous GU: clear yellow urine Extr: wwp, no edema   Results for orders placed or performed during the hospital encounter of 12/11/21 (from the past 24 hour(s))  Lactic acid, plasma     Status: Abnormal   Collection Time: 12/11/21  4:17 PM  Result Value Ref Range   Lactic Acid, Venous 6.1 (HH) 0.5 - 1.9 mmol/L  Lipase, blood     Status: None   Collection Time: 12/11/21  4:25 PM  Result Value Ref Range   Lipase 21 11 - 51 U/L  Comprehensive metabolic panel     Status: Abnormal   Collection Time: 12/11/21  4:25 PM  Result Value Ref Range   Sodium 143 135 - 145 mmol/L   Potassium 3.4 (L) 3.5 - 5.1 mmol/L   Chloride 105 98 - 111 mmol/L   CO2 20 (L) 22 - 32 mmol/L   Glucose, Bld 203 (H) 70 - 99 mg/dL   BUN 14 6 - 20 mg/dL   Creatinine, Ser 1.78 (H) 0.44 - 1.00 mg/dL   Calcium 9.0 8.9 - 10.3 mg/dL   Total Protein 6.0 (L) 6.5 - 8.1 g/dL   Albumin 2.0 (L) 3.5 - 5.0 g/dL   AST 33 15 - 41 U/L   ALT 21 0 - 44 U/L   Alkaline Phosphatase 109 38 - 126 U/L   Total Bilirubin 0.5 0.3  - 1.2 mg/dL   GFR, Estimated 34 (L) >60 mL/min   Anion gap 18 (H) 5 - 15  CBC     Status: None   Collection Time: 12/11/21  4:25 PM  Result Value Ref Range   WBC 5.6 4.0 - 10.5 K/uL   RBC 4.94 3.87 - 5.11 MIL/uL   Hemoglobin 15.0 12.0 - 15.0 g/dL   HCT 45.8 36.0 - 46.0 %   MCV 92.7 80.0 - 100.0 fL   MCH 30.4 26.0 - 34.0 pg   MCHC 32.8 30.0 - 36.0 g/dL   RDW 13.9 11.5 - 15.5 %   Platelets 365 150 - 400 K/uL   nRBC 0.0 0.0 - 0.2 %  Magnesium     Status: None   Collection Time: 12/11/21  4:25 PM  Result Value Ref Range   Magnesium 1.8 1.7 - 2.4 mg/dL  Troponin I (High Sensitivity)     Status: Abnormal   Collection Time: 12/11/21  4:25 PM  Result Value Ref Range   Troponin I (High Sensitivity) 82 (H) <18 ng/L  I-Stat beta hCG blood, ED     Status: Abnormal   Collection Time: 12/11/21  4:28 PM  Result Value Ref Range  I-stat hCG, quantitative 43.9 (H) <5 mIU/mL   Comment 3          I-stat chem 8, ED (not at [facility] or [facility])     Status: Abnormal   Collection Time: 12/11/21  4:31 PM  Result Value Ref Range   Sodium 142 135 - 145 mmol/L   Potassium 3.3 (L) 3.5 - 5.1 mmol/L   Chloride 105 98 - 111 mmol/L   BUN 15 6 - 20 mg/dL   Creatinine, Ser 1.90 (H) 0.44 - 1.00 mg/dL   Glucose, Bld 201 (H) 70 - 99 mg/dL   Calcium, Ion 1.13 (L) 1.15 - 1.40 mmol/L   TCO2 19 (L) 22 - 32 mmol/L   Hemoglobin 15.6 (H) 12.0 - 15.0 g/dL   HCT 46.0 36.0 - 46.0 %  Blood culture (routine x 2)     Status: None (Preliminary result)   Collection Time: 12/11/21  4:58 PM   Specimen: BLOOD LEFT HAND  Result Value Ref Range   Specimen Description BLOOD LEFT HAND    Special Requests      BOTTLES DRAWN AEROBIC AND ANAEROBIC Blood Culture adequate volume   Culture      NO GROWTH < 24 HOURS Performed at [facility], [address], [place], [place] [address]    Report Status PENDING   Blood culture (routine x 2)     Status: None (Preliminary result)   Collection Time: 12/11/21  5:39 PM   Specimen:  BLOOD  Result Value Ref Range   Specimen Description BLOOD SITE NOT SPECIFIED    Special Requests      BOTTLES DRAWN AEROBIC AND ANAEROBIC Blood Culture results may not be optimal due to an inadequate volume of blood received in culture bottles   Culture      NO GROWTH < 24 HOURS Performed at [facility], [place] [address], [place], [place] [address]    Report Status PENDING   Type and screen [place]     Status: None   Collection Time: 12/11/21  8:15 PM  Result Value Ref Range   ABO/RH(D) O POS    Antibody Screen NEG    Sample Expiration      12/14/2021,2359 Performed at [facility], [place] [address], [place], [place] [address]   Aerobic/Anaerobic Culture w Gram Stain (surgical/deep wound)     Status: None (Preliminary result)   Collection Time: 12/11/21  9:03 PM   Specimen: PATH Cytology Peritoneal fluid; Body Fluid  Result Value Ref Range   Specimen Description FLUID PERITONEAL    Special Requests NONE    Gram Stain      FEW WBC PRESENT,BOTH PMN AND MONONUCLEAR FEW GRAM VARIABLE ROD    Culture      NO GROWTH < 12 HOURS Performed at [facility], [place] [address], [place], [place] [address]    Report Status PENDING   Glucose, capillary     Status: Abnormal   Collection Time: 12/11/21 11:36 PM  Result Value Ref Range   Glucose-Capillary 171 (H) 70 - 99 mg/dL  MRSA Next Gen by PCR, Nasal     Status: None   Collection Time: 12/11/21 11:37 PM   Specimen: Nasal Mucosa; Nasal Swab  Result Value Ref Range   MRSA by PCR Next Gen NOT DETECTED NOT DETECTED  Lactic acid, plasma     Status: Abnormal   Collection Time: 12/12/21  1:45 AM  Result Value Ref Range  Lactic Acid, Venous 2.5 (HH) 0.5 - 1.9 mmol/L  Troponin I (High Sensitivity)     Status: Abnormal   Collection Time: 12/12/21  1:45 AM  Result Value Ref Range   Troponin I (High Sensitivity) 59 (H) <18 ng/L  CBC     Status: Abnormal   Collection Time: 12/12/21  1:45 AM  Result Value  Ref Range   WBC 13.6 (H) 4.0 - 10.5 K/uL   RBC 4.24 3.87 - 5.11 MIL/uL   Hemoglobin 13.2 12.0 - 15.0 g/dL   HCT 38.5 36.0 - 46.0 %   MCV 90.8 80.0 - 100.0 fL   MCH 31.1 26.0 - 34.0 pg   MCHC 34.3 30.0 - 36.0 g/dL   RDW 14.2 11.5 - 15.5 %   Platelets 281 150 - 400 K/uL   nRBC 0.0 0.0 - 0.2 %  Basic metabolic panel     Status: Abnormal   Collection Time: 12/12/21  1:45 AM  Result Value Ref Range   Sodium 140 135 - 145 mmol/L   Potassium 4.1 3.5 - 5.1 mmol/L   Chloride 100 98 - 111 mmol/L   CO2 24 22 - 32 mmol/L   Glucose, Bld 151 (H) 70 - 99 mg/dL   BUN 15 6 - 20 mg/dL   Creatinine, Ser 1.96 (H) 0.44 - 1.00 mg/dL   Calcium 9.1 8.9 - 10.3 mg/dL   GFR, Estimated 30 (L) >60 mL/min   Anion gap 16 (H) 5 - 15  Phosphorus     Status: None   Collection Time: 12/12/21  1:45 AM  Result Value Ref Range   Phosphorus 4.6 2.5 - 4.6 mg/dL  Magnesium     Status: None   Collection Time: 12/12/21  1:45 AM  Result Value Ref Range   Magnesium 1.7 1.7 - 2.4 mg/dL  Glucose, capillary     Status: Abnormal   Collection Time: 12/12/21  3:59 AM  Result Value Ref Range   Glucose-Capillary 156 (H) 70 - 99 mg/dL  Glucose, capillary     Status: Abnormal   Collection Time: 12/12/21  7:29 AM  Result Value Ref Range   Glucose-Capillary 149 (H) 70 - 99 mg/dL  Glucose, capillary     Status: Abnormal   Collection Time: 12/12/21 11:12 AM  Result Value Ref Range   Glucose-Capillary 142 (H) 70 - 99 mg/dL    Assessment & Plan: The plan of care was discussed with the bedside nurse for the day, who is in agreement with this plan and no additional concerns were raised.   Present on Admission: **None**    LOS: 1 day   Additional comments:I reviewed the patient's new clinical lab test results.   and I reviewed the patients new imaging test results.    Suspected perforated diverticulitis - s/p dx lap, washout, and drain placement 11/13 by Dr. Thermon Leyland. Monitor drain o/p and ROBF. FEN - NPO, NGT,  AROBF DVT - SCDs, LMWH Dispo - okay to TTF   Jessica Oka, MD Trauma & General Surgery Please use [email or web address] to contact on call provider  12/12/2021  *Care during the described time interval was provided by me. I have reviewed this patient's available data, including medical history, events of note, physical examination and test results as part of my evaluation.

## 2021-12-12 NOTE — Progress Notes (Addendum)
[facility] Progress Note Patient Name: [name] DOB: 30-Mar-1970 MRN: [id number]   Date of Service  12/12/2021  HPI/Events of Note  Patient with post-operative pain s/p laparotomy. She is morbidly obese with chronic respiratory failure, she is also NPO. Creatinine is elevated.  eICU Interventions  PRN iv Tylenol ordered.        Kennard Fildes U Dasie Chancellor 12/12/2021, 12:01 AM

## 2021-12-12 NOTE — Progress Notes (Signed)
NAME:  Jessica SWEANEY, MRN:  [id number], DOB:  Jan 21, 1971, LOS: 1 ADMISSION DATE:  12/11/2021, CONSULTATION DATE:  12/11/21 REFERRING MD:  Stechschulte CHIEF COMPLAINT:  Abd pain   History of Present Illness:  [name] is a 51 y.o. female who has a PMH as below including but not limited to OSA/OHS on CPAP and followed by Dr. Elsworth Soho. She presented to [facility] ED 11/13 with abdominal pain. CTA C/A/P demonstrated free fluid and gas. She was evaluated by CCS and was taken to the OR emergently and was found to have a well formed abscess cavity in the lower mid abdomen near the sigmoid colon with purulent peritonitis and fibrinous exudate throughout the abdomen. Findings suspected to be 2/2 perforated sigmoid diverticulitis. She had a laparoscopic peritoneal lavage with drain placement performed. Initially  it was thought pt would remain intubated post op; therefore, PCCM asked to consult for vent management. However, by the time of our evaluation in PACU, pt was extubated successfully.   Pertinent  Medical History:  has OSA (obstructive sleep apnea); Obesity hypoventilation syndrome ([place]); Cardiomyopathy ([place]); Morbid obesity ([place]); Allergic rhinitis; Asthma; Hyperlipidemia; Anxiety; Bipolar disorder ([place]); Type 2 diabetes mellitus with other specified complication ([place]); OSA on CPAP; Essential hypertension, benign; Polycythemia vera ([place]); Bipolar I disorder ([place]); Depression; Anxiety state; Chronic combined systolic and diastolic CHF (congestive heart failure) ([place]); Pulmonary hypertension ([place]); Left buttock abscess; Cervical myelopathy ([place]); Educated about COVID-19 virus infection; and Observation after surgery on their problem list.  Significant Hospital Events: Including procedures, antibiotic start and stop dates in addition to other pertinent events   11/13 admit. 11/14 extubated, alert, oriented  Interim History / Subjective:  Had pain overnight, but alert and hemodynamically stable  this morning Creatinine rising, 250cc UOP, several L IVF given overnight  Objective:  Blood pressure 113/61, pulse 78, temperature 99 F (37.2 C), temperature source Oral, resp. rate (!) 23, weight (!) 138.6 kg, SpO2 92 %.        Intake/Output Summary (Last 24 hours) at 12/12/2021 0754 Last data filed at 12/12/2021 3710 Gross per 24 hour  Intake 5166.66 ml  Output 2940 ml  Net 2226.66 ml    Filed Weights   12/11/21 2114  Weight: (!) 138.6 kg    General:  ill-appearing F, awake, uncomfortable but not in acute distress HEENT: MM pink/moist, sclera anicteric  Neuro: alert, oriented, following commands CV: s1s2 rrr, no m/r/g PULM:  on nasal cannula, mild expiratory wheezing GI: soft, no bleeding post-op, very TTP, JP drains in place Extremities: warm/dry, no edema  Skin: no rashes or lesions      Labs/imaging personally reviewed:  CTA C/A/P 11/13 > free air.   Assessment & Plan:   Pesumed perforated sigmoid diverticulitis with peritonitis and abscess - s/p laparascopic peritoneal lavage with drain placement. - Post op care per CCS. - Continue  Zosyn. - Follow cultures, wound, blood and fungal cx pending  Hx OSA/OHS  Acute Hypoxic Respiratory Failure on CPAP at 13cm pressure per office notes. - Hold CPAP for tonight given abd surgery now with nausea + NGT. Can resume 11/14. - Bronchial hygiene. -prn duonebs -extubated without issue post-op  Hypokalemia. AKI. -continue LR _0 /hr -monitor UOP, continue foley - Follow renal indices and electrolytes, avoid nephrotoxins  Hx HFpEF Echo from Feb 2022 with EF 60-65%, G1DD), HTN, HLD - Continue home ASA, Atorvastatin. - Hold home Lasix.  Hx prediabetes. - SSI.  Hx Seizures, Bipolar disorder, depression. - Hold home Alprazolam, Adderall, Clonazepam,  Lyrica, Marketing executive (evaluated daily):  Diet/type: NPO DVT prophylaxis: prophylactic heparin  GI prophylaxis: N/A Lines: N/A Foley:  Yes, and it  is still needed Code Status:  full code Last date of multidisciplinary goals of care discussion: per primary  Labs   CBC: Recent Labs  Lab 12/11/21 1625 12/11/21 1631 12/12/21 0145  WBC 5.6  --  13.6*  HGB 15.0 15.6* 13.2  HCT 45.8 46.0 38.5  MCV 92.7  --  90.8  PLT 365  --  281     Basic Metabolic Panel: Recent Labs  Lab 12/11/21 1625 12/11/21 1631 12/12/21 0145  NA 143 142 140  K 3.4* 3.3* 4.1  CL 105 105 100  CO2 20*  --  24  GLUCOSE 203* 201* 151*  BUN _0 CREATININE 1.78* 1.90* 1.96*  CALCIUM 9.0  --  9.1  MG 1.8  --  1.7  PHOS  --   --  4.6    GFR: Estimated Creatinine Clearance: 47.5 mL/min (A) (by C-G formula based on SCr of 1.96 mg/dL (H)). Recent Labs  Lab 12/11/21 1617 12/11/21 1625 12/12/21 0145  WBC  --  5.6 13.6*  LATICACIDVEN 6.1*  --  2.5*     Liver Function Tests: Recent Labs  Lab 12/11/21 1625  AST 33  ALT 21  ALKPHOS 109  BILITOT 0.5  PROT 6.0*  ALBUMIN 2.0*    Recent Labs  Lab 12/11/21 1625  LIPASE 21    No results for input(s): "AMMONIA" in the last 168 hours.  ABG    Component Value Date/Time   PHART 7.322 (L) 05/14/2019 1115   PCO2ART 52.6 (H) 05/14/2019 1115   PO2ART 93.0 05/14/2019 1115   HCO3 27.3 05/14/2019 1115   TCO2 19 (L) 12/11/2021 1631   ACIDBASEDEF 1.0 05/14/2019 0915   O2SAT 96.0 05/14/2019 1115     Coagulation Profile: No results for input(s): "INR", "PROTIME" in the last 168 hours.  Cardiac Enzymes: No results for input(s): "CKTOTAL", "CKMB", "CKMBINDEX", "TROPONINI" in the last 168 hours.  HbA1C: Hemoglobin A1C  Date/Time Value Ref Range Status  11/14/2021 03:28 PM 5.9 (A) 4.0 - 5.6 % Final   Hgb A1c MFr Bld  Date/Time Value Ref Range Status  05/20/2020 01:19 PM 6.6 (H) 4.6 - 6.5 % Final    Comment:    Glycemic Control Guidelines for People with Diabetes:Non Diabetic:  <6%Goal of Therapy: <7%Additional Action Suggested:  >8%   05/11/2019 02:11 PM 5.9 (H) 4.8 - 5.6 % Final     Comment:    (NOTE)         Prediabetes: 5.7 - 6.4         Diabetes: >6.4         Glycemic control for adults with diabetes: <7.0     CBG: Recent Labs  Lab 12/11/21 2336 12/12/21 0359 12/12/21 0729  GLUCAP 171* 156* 149*    Review of Systems:   All negative; except for those that are bolded, which indicate positives.  Constitutional: weight loss, weight gain, night sweats, fevers, chills, fatigue, weakness.  HEENT: headaches, sore throat, sneezing, nasal congestion, post nasal drip, difficulty swallowing, tooth/dental problems, visual complaints, visual changes, ear aches. Neuro: difficulty with speech, weakness, numbness, ataxia. CV:  chest pain, orthopnea, PND, swelling in lower extremities, dizziness, palpitations, syncope.  Resp: cough, hemoptysis, dyspnea, wheezing. GI: heartburn, indigestion, abdominal pain, nausea, vomiting, diarrhea, constipation, change in bowel habits, loss of appetite, hematemesis, melena, hematochezia.  GU: dysuria,  change in color of urine, urgency or frequency, flank pain, hematuria. MSK: joint pain or swelling, decreased range of motion. Psych: change in mood or affect, depression, anxiety, suicidal ideations, homicidal ideations. Skin: rash, itching, bruising.   Past Medical History:  She,  has a past medical history of Acute systolic congestive heart failure ([place]), Agoraphobia, Anxiety, Arthritis, Asthma, Bipolar disorder ([place]), Carpal tunnel syndrome of left wrist (05/2011), Cor pulmonale ([place]), Depression, History of pleural effusion, Hyperlipidemia, Hypertension, Hypoxemia, IBS (irritable bowel syndrome), Morbid obesity ([place]), Obesity hypoventilation syndrome ([place]), Pneumonia, Pre-diabetes, Prediabetes, Seasonal allergies, Seizures ([place]), Sleep apnea (sleep study 07/02/2010), Spinal stenosis in cervical region, and Urinary incontinence.   Surgical History:   Past Surgical History:  Procedure Laterality Date   APPLICATION OF INTRAOPERATIVE CT  SCAN N/A 05/14/2019   Procedure: APPLICATION OF INTRAOPERATIVE CT SCAN;  Surgeon: Erline Levine, MD;  Location: [facility];  Service: Neurosurgery;  Laterality: N/A;   CARPAL TUNNEL RELEASE  06/27/2011   Procedure: CARPAL TUNNEL RELEASE;  Surgeon: Wynonia Sours, MD;  Location: [facility];  Service: Orthopedics;  Laterality: Left;   CARPAL TUNNEL RELEASE  12/19/2011   Procedure: CARPAL TUNNEL RELEASE;  Surgeon: Wynonia Sours, MD;  Location: [facility];  Service: Orthopedics;  Laterality: Right;   INCISION AND DRAINAGE ABSCESS Left 08/27/2017   Procedure: INCISION AND DRAINAGE LEFT BUTTOCK  ABSCESS;  Surgeon: Excell Seltzer, MD;  Location: [facility];  Service: General;  Laterality: Left;   IRRIGATION AND DEBRIDEMENT BUTTOCKS Left 08/30/2017   Procedure: IRRIGATION AND DEBRIDEMENT RE-EXCISION OF LEFT SUBCUTANEOUS BUTTOCKS ABCESS;  Surgeon: Kieth Brightly Arta Bruce, MD;  Location: [facility];  Service: General;  Laterality: Left;   LAPAROSCOPY N/A 12/11/2021   Procedure: LAPAROSCOPIC PERITONEAL LAVAGE WITH DRAIN PLACEMENT;  Surgeon: Felicie Morn, MD;  Location: [facility];  Service: General;  Laterality: N/A;   MASS EXCISION  08/07/2010   right index   POSTERIOR CERVICAL FUSION/FORAMINOTOMY  09/11/2011   Procedure: POSTERIOR CERVICAL FUSION/FORAMINOTOMY LEVEL 1;  Surgeon: Erline Levine, MD;  Location: [facility];  Service: Neurosurgery;  Laterality: N/A;  Cervical Three-Four Posteior Cervical Fusion and Decompression.   POSTERIOR CERVICAL FUSION/FORAMINOTOMY N/A 05/14/2019   Procedure: Posterior cervical decompression/fusion Cervical four to Thoracic one with exploration/revision of Cervical threee-four fusion;  Surgeon: Erline Levine, MD;  Location: [facility];  Service: Neurosurgery;  Laterality: N/A;   TRIGGER FINGER RELEASE  12/19/2011   Procedure: RELEASE TRIGGER FINGER/A-1 PULLEY;  Surgeon: Wynonia Sours, MD;  Location: [facility];  Service: Orthopedics;  Laterality: Right;    TRIGGER FINGER RELEASE Left 12/31/2012   Procedure: RELEASE A-1 PULLEY LEFT THUMB;  Surgeon: Wynonia Sours, MD;  Location: [facility];  Service: Orthopedics;  Laterality: Left;   WISDOM TOOTH EXTRACTION       Social History:   reports that she has never smoked. She has never used smokeless tobacco. She reports current alcohol use. She reports that she does not use drugs.   Family History:  Her family history includes Allergic rhinitis in her mother; Asthma in her maternal grandmother and mother; Multiple sclerosis in her mother. There is no history of Breast cancer.   Allergies Allergies  Allergen Reactions   Savella [Milnacipran Hcl] Other (See Comments)    mania     Home Medications  Prior to Admission medications   Medication Sig Start Date End Date Taking? Authorizing Provider  acetaminophen (TYLENOL) 500 MG tablet Take 1,000 mg by mouth every 6 (  six) hours as needed for moderate pain.    Yes [provider]  albuterol (VENTOLIN HFA) 108 (90 Base) MCG/ACT inhaler Inhale 2 puffs into the lungs every 6 (six) hours as needed for wheezing or shortness of breath. 10/06/20  Yes Rigoberto Noel, MD  ALPRAZolam Duanne Moron) 1 MG tablet Take 1 mg by mouth 3 (three) times daily.   Yes [provider]  amphetamine-dextroamphetamine (ADDERALL) 20 MG tablet Take 20 mg by mouth in the morning, at noon, in the evening, and at bedtime.    Yes [provider]  Ascorbic Acid (VITAMIN C) 500 MG tablet Take 1,000 mg by mouth daily.   Yes [provider]  aspirin 81 MG tablet Take 1 tablet (81 mg total) by mouth daily. 05/21/19  Yes Costella, Vista Mink, PA-C  atorvastatin (LIPITOR) 20 MG tablet TAKE 1 TABLET BY MOUTH EVERY DAY IN THE EVENING Patient taking differently: Take 20 mg by mouth daily. 03/15/21  Yes Minus Breeding, MD  b complex vitamins tablet Take 1 tablet by mouth daily.   Yes [provider]  Calcium Citrate (CITRACAL PO) Take 2 tablets  by mouth daily.   Yes [provider]  Cholecalciferol (VITAMIN D3) 5000 UNITS TABS Take 10,000 Units by mouth in the morning and at bedtime.   Yes [provider]  Chromium Picolinate 500 MCG TABS Take 500 mcg by mouth daily.    Yes [provider]  diclofenac Sodium (VOLTAREN) 1 % GEL Apply 2 g topically 3 (three) times daily as needed (knee pain).   Yes [provider]  furosemide (LASIX) 40 MG tablet Take 2 tablets (80 mg total) by mouth 2 (two) times daily. Patient taking differently: Take 40-80 mg by mouth See admin instructions. Take 80 mg in the morning and 40 mg in the evening 12/05/21  Yes [name], Jeneen Rinks, MD  [name] 1.5/30 1.5-30 MG-MCG tablet Take 1 tablet by mouth at bedtime. 08/03/19  Yes [provider]  levalbuterol (XOPENEX HFA) 45 MCG/ACT inhaler Inhale 2 puffs into the lungs every 6 (six) hours as needed for wheezing. 10/14/20  Yes Rigoberto Noel, MD  lidocaine (LIDODERM) 5 % PLACE 1 PATCH ONTO THE SKIN DAILY AS NEEDED FOR KNEE PAIN. REMOVE AND DISCARD PATCH WITHIN 12 HOURS OR AS DIRECTED BY DOCTOR. Patient taking differently: Place 1 patch onto the skin as needed (knee pain). 05/22/21  Yes [place]ique, Betty G, MD  losartan (COZAAR) 25 MG tablet Take 1 tablet (25 mg total) by mouth 2 (two) times daily. 12/05/21  Yes [name], Jeneen Rinks, MD  LYRICA 300 MG capsule Take 300 mg by mouth 2 (two) times daily.  03/26/11  Yes [provider]  MAGNESIUM PO Take 500 mg by mouth daily.    Yes [provider]  Multiple Vitamin (MULTIVITAMIN) tablet Take 1 tablet by mouth daily.   Yes [provider]  NUEDEXTA 20-10 MG CAPS Take 1 capsule by mouth every 12 (twelve) hours.  09/27/16  Yes [provider]  Omega-3 Fatty Acids (FISH OIL) 1200 MG CAPS Take 2,400 mg by mouth daily.    Yes [provider]  potassium chloride SA (KLOR-CON M20) 20 MEQ tablet Take 1 tablet (20 mEq total) by mouth daily. 04/14/21  Yes [name],  Jeneen Rinks, MD  VRAYLAR 6 MG CAPS Take 6 mg by mouth every evening.  09/03/16  Yes [provider]  Zinc 50 MG CAPS Take 50 mg by mouth in the morning and at bedtime.   Yes  [provider]  Blood Glucose Monitoring Suppl (ACCU-CHEK AVIVA PLUS) w/Device KIT As directed. 11/14/21   [place]ique, Betty G, MD  clonazePAM (KLONOPIN) 1 MG tablet Take 1 tablet (1 mg total) by mouth 3 (three) times daily. Patient not taking: Reported on 12/11/2021 05/06/15   Allie Bossier, MD  methocarbamol (ROBAXIN-750) 750 MG tablet Take 1 tablet (750 mg total) by mouth 3 (three) times daily as needed for muscle spasms. Patient not taking: Reported on 12/11/2021 05/17/19   Traci Sermon, PA-C     Critical care time: N/A.   Otilio Carpen Shan Valdes, PA-C [facility] See Amion for pager If no response to pager , please call [phone] until 7pm After 7:00 pm call Elink  [phone]

## 2021-12-12 NOTE — Progress Notes (Signed)
[facility] Progress Note Patient Name: [name] DOB: Aug 23, 1970 MRN: [id number]   Date of Service  12/12/2021  HPI/Events of Note  Patient was comfortable until about an hour ago when she started coughing and now pain control is sub-optimal.  eICU Interventions  Fentanyl 12.5 - 25 mcg Q 3 hours PRN pain x 3 doses ordered.        Kerry Kass Lilburn Straw 12/12/2021, 6:09 AM

## 2021-12-12 NOTE — Progress Notes (Addendum)
[facility] Progress Note Patient Name: [name] DOB: [birth year] MRN: [id number]   Date of Service  12/12/2021  HPI/Events of Note  Patient needs immediate IV pain medication order pending arrival of ordered IV Tylenol from the pharmacy,  eICU Interventions  Fentanyl 12.5 mcg iv x 1 ordered.        [name] 12/12/2021, 12:36 AM

## 2021-12-13 DIAGNOSIS — E785 Hyperlipidemia, unspecified: Secondary | ICD-10-CM

## 2021-12-13 DIAGNOSIS — K5720 Diverticulitis of large intestine with perforation and abscess without bleeding: Secondary | ICD-10-CM | POA: Diagnosis not present

## 2021-12-13 DIAGNOSIS — I10 Essential (primary) hypertension: Secondary | ICD-10-CM | POA: Diagnosis not present

## 2021-12-13 DIAGNOSIS — F411 Generalized anxiety disorder: Secondary | ICD-10-CM | POA: Diagnosis not present

## 2021-12-13 DIAGNOSIS — G4733 Obstructive sleep apnea (adult) (pediatric): Secondary | ICD-10-CM

## 2021-12-13 DIAGNOSIS — I5042 Chronic combined systolic (congestive) and diastolic (congestive) heart failure: Secondary | ICD-10-CM

## 2021-12-13 DIAGNOSIS — F319 Bipolar disorder, unspecified: Secondary | ICD-10-CM

## 2021-12-13 DIAGNOSIS — E1169 Type 2 diabetes mellitus with other specified complication: Secondary | ICD-10-CM

## 2021-12-13 DIAGNOSIS — K668 Other specified disorders of peritoneum: Secondary | ICD-10-CM | POA: Diagnosis not present

## 2021-12-13 HISTORY — DX: Diverticulitis of large intestine with perforation and abscess without bleeding: K57.20

## 2021-12-13 LAB — GLUCOSE, CAPILLARY
Glucose-Capillary: 130 mg/dL — ABNORMAL HIGH (ref 70–99)
Glucose-Capillary: 134 mg/dL — ABNORMAL HIGH (ref 70–99)
Glucose-Capillary: 135 mg/dL — ABNORMAL HIGH (ref 70–99)
Glucose-Capillary: 137 mg/dL — ABNORMAL HIGH (ref 70–99)
Glucose-Capillary: 140 mg/dL — ABNORMAL HIGH (ref 70–99)
Glucose-Capillary: 146 mg/dL — ABNORMAL HIGH (ref 70–99)

## 2021-12-13 LAB — BASIC METABOLIC PANEL
Anion gap: 13 (ref 5–15)
BUN: 20 mg/dL (ref 6–20)
CO2: 27 mmol/L (ref 22–32)
Calcium: 8.3 mg/dL — ABNORMAL LOW (ref 8.9–10.3)
Chloride: 102 mmol/L (ref 98–111)
Creatinine, Ser: 1.4 mg/dL — ABNORMAL HIGH (ref 0.44–1.00)
GFR, Estimated: 45 mL/min — ABNORMAL LOW (ref >60)
Glucose, Bld: 142 mg/dL — ABNORMAL HIGH (ref 70–99)
Potassium: 4.2 mmol/L (ref 3.5–5.1)
Sodium: 142 mmol/L (ref 135–145)

## 2021-12-13 LAB — CBC
HCT: 34.5 % — ABNORMAL LOW (ref 36.0–46.0)
Hemoglobin: 12.1 g/dL (ref 12.0–15.0)
MCH: 31.1 pg (ref 26.0–34.0)
MCHC: 35.1 g/dL (ref 30.0–36.0)
MCV: 88.7 fL (ref 80.0–100.0)
Platelets: 292 10*3/uL (ref 150–400)
RBC: 3.89 MIL/uL (ref 3.87–5.11)
RDW: 13.9 % (ref 11.5–15.5)
WBC: 18.1 10*3/uL — ABNORMAL HIGH (ref 4.0–10.5)
nRBC: 0 % (ref 0.0–0.2)

## 2021-12-13 LAB — MAGNESIUM: Magnesium: 2.6 mg/dL — ABNORMAL HIGH (ref 1.7–2.4)

## 2021-12-13 MED ORDER — ACETAMINOPHEN 10 MG/ML IV SOLN
1000.0000 mg | Freq: Four times a day (QID) | INTRAVENOUS | Status: DC
  Administered 2021-12-13 – 2021-12-14 (×3): 1000 mg via INTRAVENOUS
  Filled 2021-12-13 (×5): qty 100

## 2021-12-13 MED ORDER — MORPHINE SULFATE (PF) 2 MG/ML IV SOLN: 1.0000 mg | INTRAVENOUS | Status: DC | PRN

## 2021-12-13 MED ORDER — METHOCARBAMOL 1000 MG/10ML IJ SOLN
1000.0000 mg | Freq: Three times a day (TID) | INTRAVENOUS | Status: DC | PRN
  Filled 2021-12-13: qty 10

## 2021-12-13 MED ORDER — METHOCARBAMOL 1000 MG/10ML IJ SOLN
1000.0000 mg | Freq: Three times a day (TID) | INTRAVENOUS | Status: DC
  Filled 2021-12-13 (×4): qty 10

## 2021-12-13 NOTE — Progress Notes (Signed)
Patient unable to wear CPAP at this time due to NG tube .

## 2021-12-13 NOTE — Progress Notes (Signed)
PROGRESS NOTE    [name]  OVF:[id number] DOB: [birth year] DOA: 12/11/2021 PCP: Martinique, Betty G, MD    Brief Narrative:  [name] is a 51 years old female with past medical history obstructive sleep apnea/OHS on CPAP presented to our hospital on 12/11/2021 with abdominal pain.   CT scan of the abdomen pelvis showed free gas and fluid.  Patient was seen by general surgery and was taken emergently to the OR.  There was finding of well-formed abscess cavity in the lower mid abdomen near the sigmoid colon with purulent peritonitis and fibrinous exudate throughout the abdomen.  Findings was suspected secondary to perforated sigmoid diverticulitis.  She had laparoscopic peritoneal lavage with drains placed.  Patient was intubated postoperatively and was initially in the ICU and was subsequently was transferred to our service after successful extubation.    Assessment and Plan:  Principal Problem:   Pneumoperitoneum Active Problems:   Morbid obesity ([place])   Hyperlipidemia   Type 2 diabetes mellitus with other specified complication (HCC)   OSA on CPAP   Essential hypertension, benign   Bipolar I disorder (HCC)   Anxiety state   Chronic combined systolic and diastolic CHF (congestive heart failure) (HCC)   Colitis   Lactic acidosis   Perforation of sigmoid colon due to diverticulitis     Presumed perforated sigmoid diverticulitis with peritonitis and abscess - s/p laparascopic peritoneal lavage with drain placement.   Continue IV Zosyn.  Blood cultures negative in less than 24 hours.  Postoperative blood cultures with no growth so far.  Patient does have NG tube in place and is n.p.o. at this time.-Further care as per general surgery.  Continue NG tube due to high output.  Foley catheter will be discontinued today.  Hx OSA/OHS/Acute Hypoxic Respiratory Failure CPAP at home.  Due to abnormal surgery, CPAP was on hold.  Patient also had NG tube.  Continue DuoNebs,  bronchial hygiene.  Currently on nasal cannula oxygen.   Hypokalemia. Improved after replacement.  Potassium of 4.2 today.  Acute kidney injury. Continue Ringer lactate at 100 mm/h.  Continue to monitor intake and output charting.  Creatinine today at 1.4.  Trending down.  Patient is positive balance for 2066 mL.  Lab Results  Component Value Date   CREATININE 1.40 (H) 12/13/2021   CREATININE 1.76 (H) 12/12/2021   CREATININE 1.96 (H) 12/12/2021    Hx HFpEF/hypertension, hyperlipidemia 20 Echo from Feb 2022 with EF 60-65%, G1DD), Lasix on hold.  Patient is currently NPO.  Hold oral medication.   Hx prediabetes. Sliding scale insulin.   Hx Seizures, Bipolar disorder, depression. Currently medications from home including Alprazolam, Adderall, Clonazepam, Lyrica, Vraylar on hold.  Morbid obesity. Body mass index is 50.85 kg/m.  Would benefit from weight loss as outpatient.   DVT prophylaxis: heparin injection 5,000 Units Start: 12/12/21 0600   Code Status:     Code Status: Prior  Disposition: Likely home with home health as per PT recommendation.  Status is: Inpatient  Remains inpatient appropriate because: Status post abdominal surgery, n.p.o. status, NG tube   Family Communication:  None at bedside.  Consultants:  General surgery. PCCM  Procedures:  Laparoscopic peritoneal lavage with drain placement on 12/11/2021. Intubation and subsequent extubation.  Antimicrobials:  Zosyn IV  Anti-infectives (From admission, onward)    Start     Dose/Rate Route Frequency Ordered Stop   12/12/21 0600  ceFEPIme (MAXIPIME) 2 g in sodium chloride 0.9 % 100 mL IVPB  Status:  Discontinued        2 g 200 mL/hr over 30 Minutes Intravenous Every 12 hours 12/11/21 2129 12/11/21 2323   12/12/21 0600  piperacillin-tazobactam (ZOSYN) IVPB 3.375 g        3.375 g 12.5 mL/hr over 240 Minutes Intravenous Every 8 hours 12/11/21 2326     12/11/21 2330  piperacillin-tazobactam (ZOSYN) IVPB  3.375 g        3.375 g 100 mL/hr over 30 Minutes Intravenous To Post Anesthesia Care Unit 12/11/21 2326 12/12/21 0041   12/11/21 1630  metroNIDAZOLE (FLAGYL) IVPB 500 mg        500 mg 100 mL/hr over 60 Minutes Intravenous  Once 12/11/21 1619 12/11/21 2049   12/11/21 1630  ceFEPIme (MAXIPIME) 2 g in sodium chloride 0.9 % 100 mL IVPB        2 g 200 mL/hr over 30 Minutes Intravenous  Once 12/11/21 1623 12/11/21 1817      Subjective: Today, patient was seen and examined at bedside.  Patient states that she feels much less pain today and has been passing some gas.  No bowel movement.  Had walked some today.  Objective: Vitals:   12/12/21 2032 12/12/21 2302 12/13/21 0126 12/13/21 0543  BP: 122/73   (!) 115/55  Pulse: 69 85  79  Resp: 20 (!) 22  18  Temp: 98.6 F (37 C)   98.4 F (36.9 C)  TempSrc: Oral   Oral  SpO2: 92% 93%  98%  Weight:      Height:   '5\' 5"'$  (1.651 m)     Intake/Output Summary (Last 24 hours) at 12/13/2021 0859 Last data filed at 12/13/2021 0558 Gross per 24 hour  Intake 2650.05 ml  Output 2800 ml  Net -149.95 ml   Filed Weights   12/11/21 2114  Weight: (!) 138.6 kg    Physical Examination: Body mass index is 50.85 kg/m.   General: Morbidly obese built, not in obvious distress , nasal cannula in place. HENT:   No scleral pallor or icterus noted. Oral mucosa is moist.  NG tube in place Chest:  Clear breath sounds.  Diminished breath sounds bilaterally. No crackles or wheezes.  CVS: S1 &S2 heard. No murmur.  Regular rate and rhythm. Abdomen: Soft, obese abdomen, incision clean dry and intact.  JP drain x 3 with serosanguineous discharge.  Tenderness on palpation which is appropriate. Extremities: No cyanosis, clubbing or edema.  Peripheral pulses are palpable. Psych: Alert, awake and oriented, normal mood CNS:  No cranial nerve deficits.  Power equal in all extremities.   Skin: Warm and dry.  No rashes noted.  Data Reviewed:   CBC: Recent Labs  Lab  12/11/21 1625 12/11/21 1631 12/12/21 0145 12/12/21 1215 12/13/21 0753  WBC 5.6  --  13.6* 18.8* 18.1*  HGB 15.0 15.6* 13.2 13.4 12.1  HCT 45.8 46.0 38.5 38.5 34.5*  MCV 92.7  --  90.8 88.7 88.7  PLT 365  --  281 308 347    Basic Metabolic Panel: Recent Labs  Lab 12/11/21 1625 12/11/21 1631 12/12/21 0145 12/12/21 1215 12/13/21 0753  NA 143 142 140 142 142  K 3.4* 3.3* 4.1 4.4 4.2  CL 105 105 100 101 102  CO2 20*  --  '24 28 27  '$ GLUCOSE 203* 201* 151* 152* 142*  BUN '14 15 15 17 20  '$ CREATININE 1.78* 1.90* 1.96* 1.76* 1.40*  CALCIUM 9.0  --  9.1 8.8* 8.3*  MG 1.8  --  1.7  --  2.6*  PHOS  --   --  4.6  --   --     Liver Function Tests: Recent Labs  Lab 12/11/21 1625  AST 33  ALT 21  ALKPHOS 109  BILITOT 0.5  PROT 6.0*  ALBUMIN 2.0*     Radiology Studies: DG Abd 1 View  Result Date: 12/11/2021 CLINICAL DATA:  Enteric catheter placement, recent laparoscopy EXAM: ABDOMEN - 1 VIEW COMPARISON:  12/11/2021 FINDINGS: Frontal view of the lower chest and upper abdomen demonstrates enteric catheter passing below diaphragm, tip overlying the gastric fundus. Surgical drains overlie the upper abdomen. No peritoneal consistent with recent surgical intervention. IMPRESSION: 1. Enteric catheter tip projecting over the gastric fundus. Electronically Signed   By: Randa Ngo M.D.   On: 12/11/2021 23:07   CT Angio Chest/Abd/Pel for Dissection W and/or Wo Contrast  Result Date: 12/11/2021 CLINICAL DATA:  Periumbilical abdominal pain and diarrhea since Friday, diaphoresis, near syncope EXAM: CT ANGIOGRAPHY CHEST, ABDOMEN AND PELVIS TECHNIQUE: Non-contrast CT of the chest was initially obtained. Multidetector CT imaging through the chest, abdomen and pelvis was performed using the standard protocol during bolus administration of intravenous contrast. Multiplanar reconstructed images and MIPs were obtained and reviewed to evaluate the vascular anatomy. RADIATION DOSE REDUCTION: This  exam was performed according to the departmental dose-optimization program which includes automated exposure control, adjustment of the mA and/or kV according to patient size and/or use of iterative reconstruction technique. CONTRAST:  61m OMNIPAQUE IOHEXOL 350 MG/ML SOLN COMPARISON:  05/03/2015 FINDINGS: CTA CHEST FINDINGS Cardiovascular: Visualized portions of the heart and descending thoracic aorta are unremarkable. No pericardial effusion. No evidence of thoracic aortic aneurysm. No evidence of descending thoracic aortic dissection. Atherosclerosis of the aorta and coronary vasculature. Mediastinum/Nodes: No enlarged mediastinal, hilar, or axillary lymph nodes. Thyroid gland, trachea, and esophagus demonstrate no significant findings. Lungs/Pleura: No acute airspace disease, effusion, or pneumothorax. Scattered emphysematous changes are seen throughout the lungs. Central airways are patent. Musculoskeletal: No acute or destructive bony lesions. Review of the MIP images confirms the above findings. CTA ABDOMEN AND PELVIS FINDINGS VASCULAR Aorta: Normal caliber aorta without aneurysm, dissection, vasculitis or significant stenosis. Mild atherosclerosis. Celiac: Patent without evidence of aneurysm, dissection, vasculitis or significant stenosis. SMA: Patent without evidence of aneurysm, dissection, vasculitis or significant stenosis. Mild atherosclerosis. Renals: Both renal arteries are patent without evidence of aneurysm, dissection, vasculitis, fibromuscular dysplasia or significant stenosis. Mild atherosclerosis. IMA: Patent without evidence of aneurysm, dissection, vasculitis or significant stenosis. Inflow: Patent without evidence of aneurysm, dissection, vasculitis or significant stenosis. Veins: No obvious venous abnormality within the limitations of this arterial phase study. Review of the MIP images confirms the above findings. NON-VASCULAR Hepatobiliary: No focal liver abnormality is seen. No gallstones,  gallbladder wall thickening, or biliary dilatation. Pancreas: Unremarkable. No pancreatic ductal dilatation or surrounding inflammatory changes. Spleen: Normal in size without focal abnormality. Adrenals/Urinary Tract: Adrenal glands are unremarkable. Kidneys are normal, without renal calculi, focal lesion, or hydronephrosis. The bladder is decompressed, limiting its evaluation. Stomach/Bowel: Multiple dilated loops of jejunum are seen within the central abdomen, measuring up to 3.9 cm, likely representing ileus. There is diffuse wall thickening of the rectosigmoid colon. There is free fluid and free gas within the lower pelvis and close proximity to the thickened sigmoid colon, with findings consistent with colitis and sigmoid colon perforation. Evaluation of the bowel is limited without oral contrast. Lymphatic: No pathologic adenopathy within the abdomen or pelvis. Reproductive: Uterus is either atrophic or surgically absent. There are  no adnexal masses. Other: Diffuse pneumoperitoneum is identified. There is a small amount of free fluid throughout the right hemiabdomen and lower pelvis. Gas/fluid collection within the central upper pelvis adjacent to the inflamed sigmoid colon measures 8.7 x 4.6 cm, consistent with a more contained area of perforation. There is no well-formed fluid collection or drainable abscess at this time. Musculoskeletal: No acute or destructive bony lesions. Reconstructed images demonstrate no additional findings. Review of the MIP images confirms the above findings. IMPRESSION: Vascular: 1. No evidence of aortic aneurysm or dissection. 2.  Aortic Atherosclerosis (ICD10-I70.0). Nonvascular: 1. Free fluid and free gas throughout the abdomen and pelvis as above, with localized gas/fluid collection adjacent to inflamed sigmoid colon within the central upper pelvis. Findings are consistent with sigmoid colitis and colonic perforation. No well-formed abscess or drainable fluid collection at  this time. 2. Distended mid jejunum, likely related to ileus. Critical Value/emergent results were called by telephone at the time of interpretation on 12/11/2021 at 556 pm to provider Godfrey Pick , who verbally acknowledged these results. Electronically Signed   By: Randa Ngo M.D.   On: 12/11/2021 18:01      LOS: 2 days    Flora Lipps, MD [facility] Available via Epic secure chat 7am-7pm After these hours, please refer to coverage provider listed on [email or web address] 12/13/2021, 8:59 AM

## 2021-12-13 NOTE — TOC Initial Note (Signed)
Transition of Care [facility]) - Initial/Assessment Note    Patient Details  Name: [name] MRN: [id number] Date of Birth: 08/20/1970  [facility]) CM/SW Contact:    Tom-Johnson, Renea Ee, RN Phone Number: 12/13/2021, 3:03 PM  Clinical Narrative:                  CM spoke with patient at bedside about needs for post hospital transition. Admitted for Abdominal pains. Found to have perforated Sigmoid Diverticulitis with Peritonitis and Abscess. Underwent  Laparoscopic Peritoneal Lavage with drain placement (3 JP drains). On IV abx. Has NG tube to Lt nare. Foley Cath d/c'd today. From home alone. States she does not have children, has three siblings that comes during the daytime and spend time with her at home. Both parents are supportive. Not employed, on disability. Does not have DME, states she has accessibility one if needed.  PCP is [place], Betty G, MD and uses [employer] on [address].  Home health recommendations called in to [facility] and [facility] voiced acceptance, info on AVS. RW ordered from [employer] and [facility] to deliver to patient at bedside. CM will continue to follow as patient progresses with care toward discharge.     Expected Discharge Plan: [place] Barriers to Discharge: Continued Medical Work up   Patient Goals and CMS Choice Patient states their goals for this hospitalization and ongoing recovery are:: To return home CMS Medicare.gov Compare Post Acute Care list provided to:: Patient Choice offered to / list presented to : Patient  Expected Discharge Plan and Services Expected Discharge Plan: [place]   Discharge Planning Services: CM Consult Post Acute Care Choice: [place] arrangements for the past 2 months: Single Family Home                 DME Arranged: [place] rolling DME Agency: [employer] Date DME Agency Contacted: 12/13/21 Time DME Agency Contacted: [phone] Representative  spoke with at DME Agency: [facility] HH Arranged: PT, RN [place] Agency: [place] Date [place]: 12/13/21 Time [place]: [phone] Representative spoke with at [place]: [place] Arrangements/Services Living arrangements for the past 2 months: [place] Lives with:: Self Patient language and need for interpreter reviewed:: Yes Do you feel safe going back to the place where you live?: Yes      Need for Family Participation in Patient Care: Yes (Comment) Care giver support system in place?: Yes (comment)   Criminal Activity/Legal Involvement Pertinent to Current Situation/Hospitalization: No - Comment as needed  Activities of Daily Living Home Assistive Devices/Equipment: None ADL Screening (condition at time of admission) Patient's cognitive ability adequate to safely complete daily activities?: Yes Is the patient deaf or have difficulty hearing?: No Does the patient have difficulty seeing, even when wearing glasses/contacts?: No Does the patient have difficulty concentrating, remembering, or making decisions?: No Patient able to express need for assistance with ADLs?: Yes Does the patient have difficulty dressing or bathing?: No Independently performs ADLs?: Yes (appropriate for developmental age) Does the patient have difficulty walking or climbing stairs?: No Weakness of Legs: None Weakness of Arms/Hands: None  Permission Sought/Granted Permission sought to share information with : Case Manager, Customer service manager, Family Supports Permission granted to share information with : Yes, Verbal Permission Granted              Emotional Assessment Appearance:: Appears stated age Attitude/Demeanor/Rapport: Engaged, Gracious Affect (  typically observed): Accepting, Appropriate, Calm, Hopeful, Pleasant Orientation: : Oriented to Self, Oriented to Place, Oriented to  Time, Oriented to Situation Alcohol / Substance Use: Not  Applicable Psych Involvement: No (comment)  Admission diagnosis:  Observation after surgery [Z48.89] Patient Active Problem List   Diagnosis Date Noted   Perforation of sigmoid colon due to diverticulitis 12/13/2021   Colitis 12/12/2021   Pneumoperitoneum 12/12/2021   Lactic acidosis 12/12/2021   Observation after surgery 12/11/2021   Educated about COVID-19 virus infection 09/21/2019   Cervical myelopathy ([place]) 05/14/2019   Left buttock abscess 08/27/2017   OSA on CPAP    Essential hypertension, benign    Polycythemia vera ([place])    Bipolar I disorder (HCC)    Depression    Anxiety state    Chronic combined systolic and diastolic CHF (congestive heart failure) ([place])    Pulmonary hypertension ([place])    Type 2 diabetes mellitus with other specified complication ([place]) 14/48/1856   Bipolar disorder ([place]) 11/14/2012   Hyperlipidemia    Anxiety    Asthma 09/05/2011   Allergic rhinitis 05/16/2011   Cardiomyopathy ([place]) 07/06/2010   Morbid obesity ([place]) 07/06/2010   OSA (obstructive sleep apnea) 06/05/2010   Obesity hypoventilation syndrome ([place]) 06/05/2010   PCP:  [place], Betty G, MD Pharmacy:   [facility], [place] [address] Phone: [phone] Fax: [place], [place] - [address] AT [facility] OF [address] [place] [address] Phone: [phone] Fax: [phone]  [id number] [id number]: [phone]: [id number]: [phone]: [phone]    Social Determinants of Health (SDOH) Interventions    Readmission Risk Interventions    05/17/2019    9:25 AM   Readmission Risk Prevention Plan  Post Dischage Appt Complete  Medication Screening Complete  Transportation Screening Complete

## 2021-12-13 NOTE — Progress Notes (Signed)
2 Days Post-Op  Subjective: Patient having some pain issues as she has no meds ordered.  Thinks she may have passed some flatus this am, but had 1900cc out of NGT yesterday and overnight.  Mobilized to chair with PT this morning, but somewhat limited secondary to pain.  ROS: See above, otherwise other systems negative  Objective: Vital signs in last 24 hours: Temp:  [97.8 F (36.6 C)-98.6 F (37 C)] 98.4 F (36.9 C) (11/15 0919) Pulse Rate:  [69-86] 86 (11/15 0919) Resp:  [18-22] 20 (11/15 0919) BP: (109-137)/(55-74) 132/69 (11/15 0919) SpO2:  [90 %-98 %] 93 % (11/15 0919) FiO2 (%):  [4 %] 4 % (11/15 0919) Last BM Date : 12/08/21  Intake/Output from previous day: 11/14 0701 - 11/15 0700 In: 2762.5 [P.O.:180; I.V.:2258.3; IV Piggyback:324.2] Out: 3005 [Urine:800; Emesis/NG output:1900; Drains:305] Intake/Output this shift: No intake/output data recorded.  PE: Gen: sitting in chair in NAD Abd: soft, morbidly obese, incisions are c/d/I, JP drains serosang output, NGT with some bilious output, appropriately tender.  Lab Results:  Recent Labs    12/12/21 1215 12/13/21 0753  WBC 18.8* 18.1*  HGB 13.4 12.1  HCT 38.5 34.5*  PLT 308 292   BMET Recent Labs    12/12/21 1215 12/13/21 0753  NA 142 142  K 4.4 4.2  CL 101 102  CO2 28 27  GLUCOSE 152* 142*  BUN 17 20  CREATININE 1.76* 1.40*  CALCIUM 8.8* 8.3*   PT/INR No results for input(s): "LABPROT", "INR" in the last 72 hours. CMP     Component Value Date/Time   NA 142 12/13/2021 0753   K 4.2 12/13/2021 0753   CL 102 12/13/2021 0753   CO2 27 12/13/2021 0753   GLUCOSE 142 (H) 12/13/2021 0753   BUN 20 12/13/2021 0753   CREATININE 1.40 (H) 12/13/2021 0753   CALCIUM 8.3 (L) 12/13/2021 0753   PROT 6.0 (L) 12/11/2021 1625   ALBUMIN 2.0 (L) 12/11/2021 1625   AST 33 12/11/2021 1625   ALT 21 12/11/2021 1625   ALKPHOS 109 12/11/2021 1625   BILITOT 0.5 12/11/2021 1625   GFRNONAA 45 (L) 12/13/2021 0753   GFRAA  >60 05/11/2019 1411   Lipase     Component Value Date/Time   LIPASE 21 12/11/2021 1625       Studies/Results: DG Abd 1 View  Result Date: 12/11/2021 CLINICAL DATA:  Enteric catheter placement, recent laparoscopy EXAM: ABDOMEN - 1 VIEW COMPARISON:  12/11/2021 FINDINGS: Frontal view of the lower chest and upper abdomen demonstrates enteric catheter passing below diaphragm, tip overlying the gastric fundus. Surgical drains overlie the upper abdomen. No peritoneal consistent with recent surgical intervention. IMPRESSION: 1. Enteric catheter tip projecting over the gastric fundus. Electronically Signed   By: Randa Ngo M.D.   On: 12/11/2021 23:07   CT Angio Chest/Abd/Pel for Dissection W and/or Wo Contrast  Result Date: 12/11/2021 CLINICAL DATA:  Periumbilical abdominal pain and diarrhea since Friday, diaphoresis, near syncope EXAM: CT ANGIOGRAPHY CHEST, ABDOMEN AND PELVIS TECHNIQUE: Non-contrast CT of the chest was initially obtained. Multidetector CT imaging through the chest, abdomen and pelvis was performed using the standard protocol during bolus administration of intravenous contrast. Multiplanar reconstructed images and MIPs were obtained and reviewed to evaluate the vascular anatomy. RADIATION DOSE REDUCTION: This exam was performed according to the departmental dose-optimization program which includes automated exposure control, adjustment of the mA and/or kV according to patient size and/or use of iterative reconstruction technique. CONTRAST:  66m OMNIPAQUE IOHEXOL 350  MG/ML SOLN COMPARISON:  05/03/2015 FINDINGS: CTA CHEST FINDINGS Cardiovascular: Visualized portions of the heart and descending thoracic aorta are unremarkable. No pericardial effusion. No evidence of thoracic aortic aneurysm. No evidence of descending thoracic aortic dissection. Atherosclerosis of the aorta and coronary vasculature. Mediastinum/Nodes: No enlarged mediastinal, hilar, or axillary lymph nodes. Thyroid  gland, trachea, and esophagus demonstrate no significant findings. Lungs/Pleura: No acute airspace disease, effusion, or pneumothorax. Scattered emphysematous changes are seen throughout the lungs. Central airways are patent. Musculoskeletal: No acute or destructive bony lesions. Review of the MIP images confirms the above findings. CTA ABDOMEN AND PELVIS FINDINGS VASCULAR Aorta: Normal caliber aorta without aneurysm, dissection, vasculitis or significant stenosis. Mild atherosclerosis. Celiac: Patent without evidence of aneurysm, dissection, vasculitis or significant stenosis. SMA: Patent without evidence of aneurysm, dissection, vasculitis or significant stenosis. Mild atherosclerosis. Renals: Both renal arteries are patent without evidence of aneurysm, dissection, vasculitis, fibromuscular dysplasia or significant stenosis. Mild atherosclerosis. IMA: Patent without evidence of aneurysm, dissection, vasculitis or significant stenosis. Inflow: Patent without evidence of aneurysm, dissection, vasculitis or significant stenosis. Veins: No obvious venous abnormality within the limitations of this arterial phase study. Review of the MIP images confirms the above findings. NON-VASCULAR Hepatobiliary: No focal liver abnormality is seen. No gallstones, gallbladder wall thickening, or biliary dilatation. Pancreas: Unremarkable. No pancreatic ductal dilatation or surrounding inflammatory changes. Spleen: Normal in size without focal abnormality. Adrenals/Urinary Tract: Adrenal glands are unremarkable. Kidneys are normal, without renal calculi, focal lesion, or hydronephrosis. The bladder is decompressed, limiting its evaluation. Stomach/Bowel: Multiple dilated loops of jejunum are seen within the central abdomen, measuring up to 3.9 cm, likely representing ileus. There is diffuse wall thickening of the rectosigmoid colon. There is free fluid and free gas within the lower pelvis and close proximity to the thickened sigmoid  colon, with findings consistent with colitis and sigmoid colon perforation. Evaluation of the bowel is limited without oral contrast. Lymphatic: No pathologic adenopathy within the abdomen or pelvis. Reproductive: Uterus is either atrophic or surgically absent. There are no adnexal masses. Other: Diffuse pneumoperitoneum is identified. There is a small amount of free fluid throughout the right hemiabdomen and lower pelvis. Gas/fluid collection within the central upper pelvis adjacent to the inflamed sigmoid colon measures 8.7 x 4.6 cm, consistent with a more contained area of perforation. There is no well-formed fluid collection or drainable abscess at this time. Musculoskeletal: No acute or destructive bony lesions. Reconstructed images demonstrate no additional findings. Review of the MIP images confirms the above findings. IMPRESSION: Vascular: 1. No evidence of aortic aneurysm or dissection. 2.  Aortic Atherosclerosis (ICD10-I70.0). Nonvascular: 1. Free fluid and free gas throughout the abdomen and pelvis as above, with localized gas/fluid collection adjacent to inflamed sigmoid colon within the central upper pelvis. Findings are consistent with sigmoid colitis and colonic perforation. No well-formed abscess or drainable fluid collection at this time. 2. Distended mid jejunum, likely related to ileus. Critical Value/emergent results were called by telephone at the time of interpretation on 12/11/2021 at 556 pm to provider Godfrey Pick , who verbally acknowledged these results. Electronically Signed   By: Randa Ngo M.D.   On: 12/11/2021 18:01    Anti-infectives: Anti-infectives (From admission, onward)    Start     Dose/Rate Route Frequency Ordered Stop   12/12/21 0600  ceFEPIme (MAXIPIME) 2 g in sodium chloride 0.9 % 100 mL IVPB  Status:  Discontinued        2 g 200 mL/hr over 30 Minutes Intravenous Every 12  hours 12/11/21 2129 12/11/21 2323   12/12/21 0600  piperacillin-tazobactam (ZOSYN) IVPB 3.375  g        3.375 g 12.5 mL/hr over 240 Minutes Intravenous Every 8 hours 12/11/21 2326     12/11/21 2330  piperacillin-tazobactam (ZOSYN) IVPB 3.375 g        3.375 g 100 mL/hr over 30 Minutes Intravenous To Post Anesthesia Care Unit 12/11/21 2326 12/12/21 0041   12/11/21 1630  metroNIDAZOLE (FLAGYL) IVPB 500 mg        500 mg 100 mL/hr over 60 Minutes Intravenous  Once 12/11/21 1619 12/11/21 2049   12/11/21 1630  ceFEPIme (MAXIPIME) 2 g in sodium chloride 0.9 % 100 mL IVPB        2 g 200 mL/hr over 30 Minutes Intravenous  Once 12/11/21 1623 12/11/21 1817        Assessment/Plan POD 2, s/p diagnostic lap washout, evacuation of abscess, and JP drain placement x2, Dr. Thermon Leyland 11/13 -cont NGT due to high output.  As bowel function returns will DC -mobilize -multi-modal pain control -pulm toilet -cont JP drains which are serous to serosang right now -unclear etiology of source of infection, likely colon but no perf noted at time of surgery.  -Dc foley today, 800cc of urine yesterday.  Good uop today so far in bag.  FEN - NPO/NGT/IVFs VTE - heparin ID - zosyn    LOS: 2 days   HTN Morbid obesity OSA CHF Bipolar disorder HLD Seizures   Henreitta Cea , [facility] 12/13/2021, 9:27 AM Please see Amion for pager number during day hours 7:00am-4:30pm or 7:00am -11:30am on weekends

## 2021-12-13 NOTE — Evaluation (Signed)
Physical Therapy Evaluation Patient Details Name: [name] MRN: [id number] DOB: October 22, 1970 Today's Date: 12/13/2021  History of Present Illness  51 y.o. female [facility] to [facility] hospital on 12/11/2021 with abdominal pain, found to have perforated sigmoid diverticulitis. Pt underwent laparoscopy with washout and drain placement on 11/13. Pt extubated 11/14. PMH includes OSA, systolic HF, anxiety, OA, asthma, bipolar disorder, depression.  Clinical Impression  Pt presents to PT with deficits in strength, power, endurance, gait, balance. Pt reports abdominal pain which limits ambulation tolerance at this time. Pt also expresses some anxiety about potentially falling in cluttered hospital room. Pt is able to ambulate a few feet with support of RW at this time. PT encourages frequent mobilization in an effort to restore independence. PT anticipates good progression if the pt is aggressively mobilized.       Recommendations for follow up therapy are one component of a multi-disciplinary discharge planning process, led by the attending physician.  Recommendations may be updated based on patient status, additional functional criteria and insurance authorization.  Follow Up Recommendations Home health PT      Assistance Recommended at Discharge PRN  Patient can return home with the following  A little help with walking and/or transfers;A little help with bathing/dressing/bathroom;Assistance with cooking/housework;Assist for transportation;Help with stairs or ramp for entrance    Equipment Recommendations Rolling walker (2 wheels)  Recommendations for Other Services       Functional Status Assessment Patient has had a recent decline in their functional status and demonstrates the ability to make significant improvements in function in a reasonable and predictable amount of time.     Precautions / Restrictions Precautions Precautions: Fall Precaution Comments: NG tube to  sution Restrictions Weight Bearing Restrictions: No      Mobility  Bed Mobility               General bed mobility comments: received in recliner    Transfers Overall transfer level: Needs assistance Equipment used: Rolling walker (2 wheels) Transfers: Sit to/from Stand Sit to Stand: Min guard                Ambulation/Gait Ambulation/Gait assistance: Min guard Gait Distance (Feet): 5 Feet Assistive device: Rolling walker (2 wheels) Gait Pattern/deviations: Step-to pattern, Wide base of support Gait velocity: reduced Gait velocity interpretation: <1.31 ft/sec, indicative of household ambulator   General Gait Details: slowed step-to gait, fair balance with support of RW, able to step backward 5' back to Physicist, medical    Modified Rankin (Stroke Patients Only)       Balance Overall balance assessment: Needs assistance Sitting-balance support: No upper extremity supported, Feet supported Sitting balance-Leahy Scale: Good     Standing balance support: No upper extremity supported, During functional activity Standing balance-Leahy Scale: Fair                               Pertinent Vitals/Pain Pain Assessment Pain Assessment: 0-10 Pain Score: 4  Pain Location: abdomen Pain Descriptors / Indicators: Sore Pain Intervention(s): Monitored during session    Home Living Family/patient expects to be discharged to:: Private residence Living Arrangements: Alone Available Help at Discharge: Family;Available PRN/intermittently (siblings) Type of Home: House Home Access: Stairs to enter Entrance Stairs-Rails: Can reach both Entrance Stairs-Number of Steps: 2   Home Layout: One level Home Equipment: Cane - single point;Crutches  Prior Function Prior Level of Function : Independent/Modified Independent;Driving             Mobility Comments: not working, spouse passed away 2 months ago        Hand Dominance   Dominant Hand: Right    Extremity/Trunk Assessment   Upper Extremity Assessment Upper Extremity Assessment: Overall WFL for tasks assessed    Lower Extremity Assessment Lower Extremity Assessment: Generalized weakness    Cervical / Trunk Assessment Cervical / Trunk Assessment: Other exceptions Cervical / Trunk Exceptions: abdominal surgery  Communication   Communication: No difficulties  Cognition Arousal/Alertness: Awake/alert Behavior During Therapy: WFL for tasks assessed/performed Overall Cognitive Status: Within Functional Limits for tasks assessed                                          General Comments General comments (skin integrity, edema, etc.): VSS, pt on 2L [place]    Exercises     Assessment/Plan    PT Assessment Patient needs continued PT services  PT Problem List Decreased strength;Decreased activity tolerance;Decreased balance;Decreased mobility;Decreased knowledge of use of DME;Pain;Cardiopulmonary status limiting activity       PT Treatment Interventions DME instruction;Gait training;Functional mobility training;Stair training;Therapeutic activities;Therapeutic exercise;Balance training;Neuromuscular re-education;Patient/family education    PT Goals (Current goals can be found in the Care Plan section)  Acute Rehab PT Goals Patient Stated Goal: to reduce pain and return to independence PT Goal Formulation: With patient Time For Goal Achievement: 12/27/21 Potential to Achieve Goals: Good    Frequency Min 3X/week     Co-evaluation               AM-PAC PT "6 Clicks" Mobility  Outcome Measure Help needed turning from your back to your side while in a flat bed without using bedrails?: A Little Help needed moving from lying on your back to sitting on the side of a flat bed without using bedrails?: A Little Help needed moving to and from a bed to a chair (including a wheelchair)?: A Little Help needed  standing up from a chair using your arms (e.g., wheelchair or bedside chair)?: A Little Help needed to walk in hospital room?: Total Help needed climbing 3-5 steps with a railing? : Total 6 Click Score: 14    End of Session Equipment Utilized During Treatment: Oxygen Activity Tolerance: Patient limited by pain Patient left: in chair;with call bell/phone within reach Nurse Communication: Mobility status PT Visit Diagnosis: Other abnormalities of gait and mobility (R26.89);Muscle weakness (generalized) (M62.81);Pain Pain - part of body:  (abdomen)    Time: 0037-0488 PT Time Calculation (min) (ACUTE ONLY): 17 min   Charges:   PT Evaluation $PT Eval Low Complexity: 1 Low          Zenaida Niece, PT, DPT Acute Rehabilitation Office [phone]   Zenaida Niece 12/13/2021, 10:01 AM

## 2021-12-14 DIAGNOSIS — F411 Generalized anxiety disorder: Secondary | ICD-10-CM | POA: Diagnosis not present

## 2021-12-14 DIAGNOSIS — F319 Bipolar disorder, unspecified: Secondary | ICD-10-CM | POA: Diagnosis not present

## 2021-12-14 DIAGNOSIS — I5042 Chronic combined systolic (congestive) and diastolic (congestive) heart failure: Secondary | ICD-10-CM | POA: Diagnosis not present

## 2021-12-14 DIAGNOSIS — K668 Other specified disorders of peritoneum: Secondary | ICD-10-CM | POA: Diagnosis not present

## 2021-12-14 LAB — CBC
HCT: 37.7 % (ref 36.0–46.0)
Hemoglobin: 12.6 g/dL (ref 12.0–15.0)
MCH: 30 pg (ref 26.0–34.0)
MCHC: 33.4 g/dL (ref 30.0–36.0)
MCV: 89.8 fL (ref 80.0–100.0)
Platelets: 288 10*3/uL (ref 150–400)
RBC: 4.2 MIL/uL (ref 3.87–5.11)
RDW: 14.1 % (ref 11.5–15.5)
WBC: 12.8 10*3/uL — ABNORMAL HIGH (ref 4.0–10.5)
nRBC: 0 % (ref 0.0–0.2)

## 2021-12-14 LAB — BASIC METABOLIC PANEL
Anion gap: 9 (ref 5–15)
BUN: 22 mg/dL — ABNORMAL HIGH (ref 6–20)
CO2: 28 mmol/L (ref 22–32)
Calcium: 8.4 mg/dL — ABNORMAL LOW (ref 8.9–10.3)
Chloride: 102 mmol/L (ref 98–111)
Creatinine, Ser: 1.36 mg/dL — ABNORMAL HIGH (ref 0.44–1.00)
GFR, Estimated: 47 mL/min — ABNORMAL LOW (ref >60)
Glucose, Bld: 124 mg/dL — ABNORMAL HIGH (ref 70–99)
Potassium: 3.7 mmol/L (ref 3.5–5.1)
Sodium: 139 mmol/L (ref 135–145)

## 2021-12-14 LAB — GLUCOSE, CAPILLARY
Glucose-Capillary: 124 mg/dL — ABNORMAL HIGH (ref 70–99)
Glucose-Capillary: 108 mg/dL — ABNORMAL HIGH (ref 70–99)
Glucose-Capillary: 117 mg/dL — ABNORMAL HIGH (ref 70–99)
Glucose-Capillary: 125 mg/dL — ABNORMAL HIGH (ref 70–99)
Glucose-Capillary: 143 mg/dL — ABNORMAL HIGH (ref 70–99)
Glucose-Capillary: 104 mg/dL — ABNORMAL HIGH (ref 70–99)

## 2021-12-14 LAB — AEROBIC/ANAEROBIC CULTURE W GRAM STAIN (SURGICAL/DEEP WOUND)

## 2021-12-14 MED ORDER — OXYCODONE HCL 5 MG PO TABS
5.0000 mg | ORAL_TABLET | ORAL | Status: DC | PRN
  Administered 2021-12-16: 5 mg via ORAL
  Administered 2021-12-20 – 2021-12-25 (×6): 10 mg via ORAL
  Administered 2021-12-26: 5 mg via ORAL
  Administered 2021-12-26 – 2022-01-04 (×8): 10 mg via ORAL
  Filled 2021-12-14 (×4): qty 2
  Filled 2021-12-14 (×2): qty 1
  Filled 2021-12-14 (×7): qty 2
  Filled 2021-12-14: qty 1
  Filled 2021-12-14 (×3): qty 2

## 2021-12-14 MED ORDER — MORPHINE SULFATE (PF) 2 MG/ML IV SOLN: 1.0000 mg | INTRAVENOUS | Status: DC | PRN

## 2021-12-14 MED ORDER — CARIPRAZINE HCL 1.5 MG PO CAPS
6.0000 mg | ORAL_CAPSULE | Freq: Every evening | ORAL | Status: DC
  Administered 2021-12-14 – 2022-01-04 (×22): 6 mg via ORAL
  Filled 2021-12-14 (×2): qty 4
  Filled 2021-12-14: qty 2
  Filled 2021-12-14 (×21): qty 4

## 2021-12-14 MED ORDER — ALPRAZOLAM 0.5 MG PO TABS
1.0000 mg | ORAL_TABLET | Freq: Three times a day (TID) | ORAL | Status: DC
  Administered 2021-12-14 – 2021-12-27 (×36): 1 mg via ORAL
  Filled 2021-12-14 (×41): qty 2

## 2021-12-14 MED ORDER — DEXTROMETHORPHAN-QUINIDINE 20-10 MG PO CAPS
1.0000 | ORAL_CAPSULE | Freq: Two times a day (BID) | ORAL | Status: DC
  Administered 2021-12-14 – 2022-01-05 (×44): 1 via ORAL
  Filled 2021-12-14 (×46): qty 1

## 2021-12-14 MED ORDER — LORAZEPAM 2 MG/ML IJ SOLN: 0.5000 mg | INTRAMUSCULAR | Status: DC | PRN

## 2021-12-14 MED ORDER — ACETAMINOPHEN 500 MG PO TABS
1000.0000 mg | ORAL_TABLET | Freq: Four times a day (QID) | ORAL | Status: DC
  Administered 2021-12-14 – 2022-01-05 (×70): 1000 mg via ORAL
  Filled 2021-12-14 (×80): qty 2

## 2021-12-14 MED ORDER — METHOCARBAMOL 500 MG PO TABS
1000.0000 mg | ORAL_TABLET | Freq: Three times a day (TID) | ORAL | Status: DC
  Administered 2021-12-14 – 2021-12-28 (×40): 1000 mg via ORAL
  Filled 2021-12-14 (×43): qty 2

## 2021-12-14 MED ORDER — PREGABALIN 100 MG PO CAPS
300.0000 mg | ORAL_CAPSULE | Freq: Two times a day (BID) | ORAL | Status: DC
  Administered 2021-12-14 – 2022-01-05 (×44): 300 mg via ORAL
  Filled 2021-12-14 (×45): qty 3

## 2021-12-14 MED ORDER — AMPHETAMINE-DEXTROAMPHETAMINE 10 MG PO TABS
20.0000 mg | ORAL_TABLET | Freq: Three times a day (TID) | ORAL | Status: DC
  Administered 2021-12-14 – 2022-01-05 (×31): 20 mg via ORAL
  Filled 2021-12-14 (×47): qty 2

## 2021-12-14 NOTE — Progress Notes (Signed)
Several attempts made to to bladder scan pt., doppler unable to detect bladder.

## 2021-12-14 NOTE — Progress Notes (Signed)
PROGRESS NOTE    [name]  PNT:[id number] DOB: November 24, 1970 DOA: 12/11/2021 PCP: [place], Betty G, MD    Brief Narrative:  [name] is a 51 years old female with past medical history obstructive sleep apnea/OHS on CPAP presented to our hospital on 12/11/2021 with abdominal pain.   CT scan of the abdomen pelvis showed free gas and fluid.  Patient was seen by general surgery and was taken emergently to the OR.  There was finding of well-formed abscess cavity in the lower mid abdomen near the sigmoid colon with purulent peritonitis and fibrinous exudate throughout the abdomen.  Findings was suspected secondary to perforated sigmoid diverticulitis.  She had laparoscopic peritoneal lavage with drains placed.  Patient was intubated postoperatively and was initially in the ICU and was subsequently was transferred to our service after successful extubation.    Assessment and Plan:  Principal Problem:   Pneumoperitoneum Active Problems:   Morbid obesity ([place])   Hyperlipidemia   Type 2 diabetes mellitus with other specified complication (HCC)   OSA on CPAP   Essential hypertension, benign   Bipolar I disorder (HCC)   Anxiety state   Chronic combined systolic and diastolic CHF (congestive heart failure) (HCC)   Colitis   Lactic acidosis   Perforation of sigmoid colon due to diverticulitis    Presumed perforated sigmoid diverticulitis with peritonitis and abscess - s/p laparascopic peritoneal lavage with drain placement. Patient is currently n.p.o. with NG tube due to high output.  Continue IV Zosyn.  Blood cultures are negative so far.  Fluid with fungal culture pending, aerobic/anaerobic culture with few E. coli.  Sensitive to Zosyn.  Surgery is ok with sips with meds, Follow surgical recommendations.  Hx OSA/OHS/Acute Hypoxic Respiratory Failure CPAP at home.  Due to abdominal surgery and NG tube, nasal cannula oxygen, continue DuoNebs, bronchial hygiene.     Hypokalemia. Replenished. Potassium of 3.7.  Acute kidney injury. Continue Ringer lactate at 100 mm/h.  Continue to monitor intake and output charting.  Creatinine today at 1.3 from 1.4.  Trending down.  Patient is positive balance for 4991 mL.  Lab Results  Component Value Date   CREATININE 1.36 (H) 12/14/2021   CREATININE 1.40 (H) 12/13/2021   CREATININE 1.76 (H) 12/12/2021    Hx HFpEF/hypertension, hyperlipidemia 20 Echo from Feb 2022 with EF 60-65%, G1DD), Lasix on hold.    Hx prediabetes. Continue sliding scale insulin.  Latest POC glucose of 117   Hx Seizures, Bipolar disorder, depression. Currently medications from home including Alprazolam, Adderall, Clonazepam, Lyrica, Vraylar on hold.  We will restart since patient had some agitation issues.  Add IV Ativan as needed as well.  Morbid obesity. Body mass index is 50.85 kg/m.  Would benefit from weight loss as outpatient.   DVT prophylaxis: heparin injection 5,000 Units Start: 12/12/21 0600   Code Status:     Code Status: Prior  Disposition:  Likely home with home health as per PT recommendation.  Status is: Inpatient  Remains inpatient appropriate because: Status post abdominal surgery, n.p.o. status, NG tube,   Family Communication:  None at bedside.  Consultants:  General surgery. PCCM  Procedures:  Laparoscopic peritoneal lavage with drain placement on 12/11/2021. Intubation and subsequent extubation. NG tube placement and self removal  Antimicrobials:  Zosyn IV 11/13>  Anti-infectives (From admission, onward)    Start     Dose/Rate Route Frequency Ordered Stop   12/12/21 0600  ceFEPIme (MAXIPIME) 2 g in sodium chloride 0.9 % 100  mL IVPB  Status:  Discontinued        2 g 200 mL/hr over 30 Minutes Intravenous Every 12 hours 12/11/21 2129 12/11/21 2323   12/12/21 0600  piperacillin-tazobactam (ZOSYN) IVPB 3.375 g        3.375 g 12.5 mL/hr over 240 Minutes Intravenous Every 8 hours 12/11/21 2326      12/11/21 2330  piperacillin-tazobactam (ZOSYN) IVPB 3.375 g        3.375 g 100 mL/hr over 30 Minutes Intravenous To Post Anesthesia Care Unit 12/11/21 2326 12/12/21 0041   12/11/21 1630  metroNIDAZOLE (FLAGYL) IVPB 500 mg        500 mg 100 mL/hr over 60 Minutes Intravenous  Once 12/11/21 1619 12/11/21 2049   12/11/21 1630  ceFEPIme (MAXIPIME) 2 g in sodium chloride 0.9 % 100 mL IVPB        2 g 200 mL/hr over 30 Minutes Intravenous  Once 12/11/21 1623 12/11/21 1817      Subjective: Today, patient was seen and examined at bedside.  Nursing staff reported that the patient was little agitated and pulled her tube out.  Has not been on her psych medications.  She denies overt pain nausea or vomiting.  Has passed flatus but no bowel movements.  Denies overt pain.  Objective: Vitals:   12/13/21 0919 12/13/21 1656 12/13/21 2145 12/14/21 0411  BP: 132/69 (!) 148/74 131/73 (!) 142/79  Pulse: 86 80 79 60  Resp: '20 20 18 20  '$ Temp: 98.4 F (36.9 C) 98.4 F (36.9 C) 98.2 F (36.8 C) 98.2 F (36.8 C)  TempSrc: Oral Oral Oral Oral  SpO2: 93% 94% 96% 92%  Weight:      Height:        Intake/Output Summary (Last 24 hours) at 12/14/2021 0841 Last data filed at 12/14/2021 0600 Gross per 24 hour  Intake 4515.11 ml  Output 1590 ml  Net 2925.11 ml    Filed Weights   12/11/21 2114  Weight: (!) 138.6 kg    Physical Examination: Body mass index is 50.85 kg/m.   General: Morbidly obese built, not in obvious distress, sitting up in the chair, Communicative HENT:   No scleral pallor or icterus noted. Oral mucosa is moist.  Place, Chest:    Diminished breath sounds bilaterally. No crackles or wheezes.  CVS: S1 &S2 heard. No murmur.  Regular rate and rhythm. Abdomen: Soft, JP drain x3 with serosanguineous discharge.  Appropriate tenderness on palpation.  Obese abdomen. Extremities: No cyanosis, clubbing or edema.  Peripheral pulses are palpable. Psych: Alert, awake and oriented, normal  mood CNS:  No cranial nerve deficits.  Power equal in all extremities.   Skin: Warm and dry.  Abdominal wall dressing/drains.   Data Reviewed:   CBC: Recent Labs  Lab 12/11/21 1625 12/11/21 1631 12/12/21 0145 12/12/21 1215 12/13/21 0753 12/14/21 0602  WBC 5.6  --  13.6* 18.8* 18.1* 12.8*  HGB 15.0 15.6* 13.2 13.4 12.1 12.6  HCT 45.8 46.0 38.5 38.5 34.5* 37.7  MCV 92.7  --  90.8 88.7 88.7 89.8  PLT 365  --  281 308 292 288     Basic Metabolic Panel: Recent Labs  Lab 12/11/21 1625 12/11/21 1631 12/12/21 0145 12/12/21 1215 12/13/21 0753 12/14/21 0602  NA 143 142 140 142 142 139  K 3.4* 3.3* 4.1 4.4 4.2 3.7  CL 105 105 100 101 102 102  CO2 20*  --  '24 28 27 28  '$ GLUCOSE 203* 201* 151* 152* 142* 124*  BUN '14 15 15 17 20 '$ 22*  CREATININE 1.78* 1.90* 1.96* 1.76* 1.40* 1.36*  CALCIUM 9.0  --  9.1 8.8* 8.3* 8.4*  MG 1.8  --  1.7  --  2.6*  --   PHOS  --   --  4.6  --   --   --      Liver Function Tests: Recent Labs  Lab 12/11/21 1625  AST 33  ALT 21  ALKPHOS 109  BILITOT 0.5  PROT 6.0*  ALBUMIN 2.0*      Radiology Studies: No results found.    LOS: 3 days    Flora Lipps, MD [facility] Available via Epic secure chat 7am-7pm After these hours, please refer to coverage provider listed on [email or web address] 12/14/2021, 8:41 AM

## 2021-12-14 NOTE — Progress Notes (Signed)
Pharmacy Antibiotic Note  [name] is a 51 y.o. female admitted on 12/11/2021 with intra-abdominal abscess  s/p peritoneal lavage with drain placement.  Pharmacy has been consulted for Zosyn dosing.   11/13 Peritoneal fluid Cx: grew  E. Coli , pan sensitive including Zosyn , except resistant to ampicillin.  Blood cultures no growth to date, pending.   Continues on Zosyn.  - Afebrile. WBC 13.6>18.1>12.8k.  Scr trending down to 1.3 today.  11/13 bcx: ngtd  11/13 peritoneal: E. Coli: pan sens , R to amp.  Bacteroides thetaiotaomicron beta lactamase negative.  11/113 fungus peritoneal fld : pending 11/13 MRSA PCR: neg  Plan: Continue Zosyn 3.375g IV q8h (4 hour infusion). Monitor clinical status, renal function and culture results daily.    Height: '5\' 5"'$  (165.1 cm) Weight: (!) 138.6 kg (305 lb 8.9 oz) IBW/kg (Calculated) : 57  Temp (24hrs), Avg:98.3 F (36.8 C), Min:98.2 F (36.8 C), Max:98.4 F (36.9 C)  Recent Labs  Lab 12/11/21 1617 12/11/21 1625 12/11/21 1625 12/11/21 1631 12/12/21 0145 12/12/21 1215 12/13/21 0753 12/14/21 0602  WBC  --  5.6  --   --  13.6* 18.8* 18.1* 12.8*  CREATININE  --  1.78*   < > 1.90* 1.96* 1.76* 1.40* 1.36*  LATICACIDVEN 6.1*  --   --   --  2.5*  --   --   --    < > = values in this interval not displayed.     Estimated Creatinine Clearance: 68.4 mL/min (A) (by C-G formula based on SCr of 1.36 mg/dL (H)).    Allergies  Allergen Reactions   Savella [Milnacipran Hcl] Other (See Comments)    mania    Thank you for allowing pharmacy to be a part of this patient's care.  Nicole Cella, RPh Clinical Pharmacist 12/14/2021 12:33 PM Please check AMION for all [facility] phone numbers After 10:00 PM, call [place] [phone]

## 2021-12-14 NOTE — Progress Notes (Signed)
General Surgery Follow Up Note  Subjective:    Overnight Issues:   Objective:  Vital signs for last 24 hours: Temp:  [98.2 F (36.8 C)-98.4 F (36.9 C)] 98.2 F (36.8 C) (11/16 0411) Pulse Rate:  [60-86] 60 (11/16 0411) Resp:  [18-20] 20 (11/16 0411) BP: (131-148)/(69-79) 142/79 (11/16 0411) SpO2:  [92 %-96 %] 92 % (11/16 0411) FiO2 (%):  [4 %] 4 % (11/15 0919)  Hemodynamic parameters for last 24 hours:    Intake/Output from previous day: 11/15 0701 - 11/16 0700 In: 4515.1 [I.V.:2290.3; IV Piggyback:2224.8] Out: 1590 [Urine:600; Emesis/NG output:800; Drains:190]  Intake/Output this shift: No intake/output data recorded.  Vent settings for last 24 hours: FiO2 (%):  [4 %] 4 %  Physical Exam:  Gen: comfortable, no distress Neuro: non-focal exam HEENT: PERRL Neck: supple CV: RRR Pulm: unlabored breathing Abd: soft, NT, incisions cdi, drains serous 190cc GU: clear yellow urine Extr: wwp, no edema   Results for orders placed or performed during the hospital encounter of 12/11/21 (from the past 24 hour(s))  Glucose, capillary     Status: Abnormal   Collection Time: 12/13/21 11:38 AM  Result Value Ref Range   Glucose-Capillary 134 (H) 70 - 99 mg/dL  Glucose, capillary     Status: Abnormal   Collection Time: 12/13/21  4:54 PM  Result Value Ref Range   Glucose-Capillary 137 (H) 70 - 99 mg/dL  Glucose, capillary     Status: Abnormal   Collection Time: 12/13/21  8:33 PM  Result Value Ref Range   Glucose-Capillary 130 (H) 70 - 99 mg/dL  Glucose, capillary     Status: Abnormal   Collection Time: 12/13/21 11:46 PM  Result Value Ref Range   Glucose-Capillary 135 (H) 70 - 99 mg/dL  Glucose, capillary     Status: Abnormal   Collection Time: 12/14/21  4:10 AM  Result Value Ref Range   Glucose-Capillary 124 (H) 70 - 99 mg/dL  Glucose, capillary     Status: Abnormal   Collection Time: 12/14/21  5:16 AM  Result Value Ref Range   Glucose-Capillary 108 (H) 70 - 99 mg/dL   CBC     Status: Abnormal   Collection Time: 12/14/21  6:02 AM  Result Value Ref Range   WBC 12.8 (H) 4.0 - 10.5 K/uL   RBC 4.20 3.87 - 5.11 MIL/uL   Hemoglobin 12.6 12.0 - 15.0 g/dL   HCT 37.7 36.0 - 46.0 %   MCV 89.8 80.0 - 100.0 fL   MCH 30.0 26.0 - 34.0 pg   MCHC 33.4 30.0 - 36.0 g/dL   RDW 14.1 11.5 - 15.5 %   Platelets 288 150 - 400 K/uL   nRBC 0.0 0.0 - 0.2 %  Basic metabolic panel     Status: Abnormal   Collection Time: 12/14/21  6:02 AM  Result Value Ref Range   Sodium 139 135 - 145 mmol/L   Potassium 3.7 3.5 - 5.1 mmol/L   Chloride 102 98 - 111 mmol/L   CO2 28 22 - 32 mmol/L   Glucose, Bld 124 (H) 70 - 99 mg/dL   BUN 22 (H) 6 - 20 mg/dL   Creatinine, Ser 1.36 (H) 0.44 - 1.00 mg/dL   Calcium 8.4 (L) 8.9 - 10.3 mg/dL   GFR, Estimated 47 (L) >60 mL/min   Anion gap 9 5 - 15  Glucose, capillary     Status: Abnormal   Collection Time: 12/14/21  7:27 AM  Result Value Ref Range  Glucose-Capillary 117 (H) 70 - 99 mg/dL    Assessment & Plan:  Present on Admission:  Anxiety state  Bipolar I disorder (HCC)  Chronic combined systolic and diastolic CHF (congestive heart failure) (HCC)  Essential hypertension, benign  Hyperlipidemia  Morbid obesity ([place])  Type 2 diabetes mellitus with other specified complication ([place])    LOS: 3 days   Additional comments:I reviewed the patient's new clinical lab test results.   and I reviewed the patients new imaging test results.    POD 3, s/p diagnostic lap washout, evacuation of abscess, and JP drain placement x2, Dr. Thermon Leyland 11/13 -cont NGT due to high output.  As bowel function returns will DC -mobilize -multi-modal pain control -pulm toilet -cont JP drains which are serous to serosang right now -unclear etiology of source of infection, likely colon but no perf noted at time of surgery. [name]   FEN - NPO/NGT/IVFs VTE - heparin ID - zosyn      LOS: 2 days    HTN Morbid obesity OSA CHF Bipolar  disorder HLD Seizures  Jesusita Oka, MD Trauma & General Surgery Please use [email or web address] to contact on call provider  12/14/2021  *Care during the described time interval was provided by me. I have reviewed this patient's available data, including medical history, events of note, physical examination and test results as part of my evaluation.

## 2021-12-14 NOTE — Progress Notes (Signed)
Physical Therapy Treatment Patient Details Name: SOLIANA KITKO MRN: [id number] DOB: 1970-11-03 Today's Date: 12/14/2021   History of Present Illness 51 y.o. female presents to [facility] hospital on 12/11/2021 with abdominal pain, found to have perforated sigmoid diverticulitis. Pt underwent laparoscopy with washout and drain placement on 11/13. Pt extubated 11/14. PMH includes OSA, systolic HF, anxiety, OA, asthma, bipolar disorder, depression.    PT Comments    Patient with mild confusion this date with deficits in processing, safety awareness, and problem solving. Patient ambulated in room 40' with RW and min guard. Declined out of room mobility. Able to stand from low surface commode with min guard. Friend in room states cognition is not baseline but improved from overnight. D/c plan remains appropriate unless cognitive deficits persist.     Recommendations for follow up therapy are one component of a multi-disciplinary discharge planning process, led by the attending physician.  Recommendations may be updated based on patient status, additional functional criteria and insurance authorization.  Follow Up Recommendations  Home health PT     Assistance Recommended at Discharge Intermittent Supervision/Assistance  Patient can return home with the following A little help with walking and/or transfers;A little help with bathing/dressing/bathroom;Assistance with cooking/housework;Assist for transportation;Help with stairs or ramp for entrance   Equipment Recommendations  Rolling Anthony Roland (2 wheels)    Recommendations for Other Services       Precautions / Restrictions Precautions Precautions: Fall Restrictions Weight Bearing Restrictions: No     Mobility  Bed Mobility               General bed mobility comments: up in recliner    Transfers Overall transfer level: Needs assistance Equipment used: Rolling Randi Poullard (2 wheels) Transfers: Sit to/from Stand Sit to Stand: Min  guard           General transfer comment: min guard for safety    Ambulation/Gait Ambulation/Gait assistance: Min guard Gait Distance (Feet): 40 Feet Assistive device: Rolling Kaison Mcparland (2 wheels) Gait Pattern/deviations: Step-to pattern, Wide base of support Gait velocity: decreased     General Gait Details: min guard for safety. Ambulated in room as patient declined out of room mobility. Cues for RW management   Stairs             Wheelchair Mobility    Modified Rankin (Stroke Patients Only)       Balance Overall balance assessment: Needs assistance Sitting-balance support: No upper extremity supported, Feet supported Sitting balance-Leahy Scale: Good     Standing balance support: No upper extremity supported, During functional activity Standing balance-Leahy Scale: Fair                              Cognition Arousal/Alertness: Awake/alert Behavior During Therapy: WFL for tasks assessed/performed Overall Cognitive Status: Impaired/Different from baseline Area of Impairment: Attention, Memory, Safety/judgement, Awareness, Problem solving                   Current Attention Level: Selective Memory: Decreased short-term memory   Safety/Judgement: Decreased awareness of safety Awareness: Emergent Problem Solving: Slow processing General Comments: per friend present, patient not at baseline. Demos slow processing and poor awareness of safety        Exercises      General Comments        Pertinent Vitals/Pain Pain Assessment Pain Assessment: No/denies pain    Home Living  Prior Function            PT Goals (current goals can now be found in the care plan section) Acute Rehab PT Goals PT Goal Formulation: With patient Time For Goal Achievement: 12/27/21 Potential to Achieve Goals: Good Progress towards PT goals: Progressing toward goals    Frequency    Min 3X/week      PT Plan  Current plan remains appropriate    Co-evaluation              AM-PAC PT "6 Clicks" Mobility   Outcome Measure  Help needed turning from your back to your side while in a flat bed without using bedrails?: A Little Help needed moving from lying on your back to sitting on the side of a flat bed without using bedrails?: A Little Help needed moving to and from a bed to a chair (including a wheelchair)?: A Little Help needed standing up from a chair using your arms (e.g., wheelchair or bedside chair)?: A Little Help needed to walk in hospital room?: A Little Help needed climbing 3-5 steps with a railing? : Total 6 Click Score: 16    End of Session   Activity Tolerance: Patient tolerated treatment well Patient left: in chair;with call bell/phone within reach;with family/visitor present Nurse Communication: Mobility status PT Visit Diagnosis: Other abnormalities of gait and mobility (R26.89);Muscle weakness (generalized) (M62.81);Pain     Time: 0768-0881 PT Time Calculation (min) (ACUTE ONLY): 24 min  Charges:  $Therapeutic Activity: 23-37 mins                     Harvey Lingo A. Gilford Rile PT, DPT Acute Rehabilitation Services Office [phone]    Linna Hoff 12/14/2021, 2:17 PM

## 2021-12-14 NOTE — Progress Notes (Signed)
Upon entering pt's room, noted pt. had removed her NG tube. No distress noted, pt mildly confused. Attempt made to reinsert new NG tube, pt. unable to tolerate.

## 2021-12-14 NOTE — Progress Notes (Signed)
Mobility Specialist Progress Note:   12/14/21 0915  Mobility  Activity Transferred from bed to chair  Level of Assistance Contact guard assist, steadying assist  Assistive Device Front wheel walker;None  Distance Ambulated (ft) 3 ft  Activity Response Tolerated well  Mobility Referral Yes  $Mobility charge 1 Mobility   Pt requesting to transfer to chair. No physical assistance required, only close minG for safety. Pt left with all needs met.   Nelta Numbers Mobility Specialist Please contact via SecureChat or  Rehab office at [phone]

## 2021-12-15 DIAGNOSIS — F411 Generalized anxiety disorder: Secondary | ICD-10-CM | POA: Diagnosis not present

## 2021-12-15 DIAGNOSIS — I5042 Chronic combined systolic (congestive) and diastolic (congestive) heart failure: Secondary | ICD-10-CM | POA: Diagnosis not present

## 2021-12-15 DIAGNOSIS — K668 Other specified disorders of peritoneum: Secondary | ICD-10-CM | POA: Diagnosis not present

## 2021-12-15 DIAGNOSIS — F319 Bipolar disorder, unspecified: Secondary | ICD-10-CM | POA: Diagnosis not present

## 2021-12-15 LAB — GLUCOSE, CAPILLARY
Glucose-Capillary: 110 mg/dL — ABNORMAL HIGH (ref 70–99)
Glucose-Capillary: 117 mg/dL — ABNORMAL HIGH (ref 70–99)
Glucose-Capillary: 138 mg/dL — ABNORMAL HIGH (ref 70–99)
Glucose-Capillary: 155 mg/dL — ABNORMAL HIGH (ref 70–99)
Glucose-Capillary: 157 mg/dL — ABNORMAL HIGH (ref 70–99)
Glucose-Capillary: 87 mg/dL (ref 70–99)

## 2021-12-15 LAB — BASIC METABOLIC PANEL
Anion gap: 12 (ref 5–15)
BUN: 16 mg/dL (ref 6–20)
CO2: 26 mmol/L (ref 22–32)
Calcium: 8.2 mg/dL — ABNORMAL LOW (ref 8.9–10.3)
Chloride: 100 mmol/L (ref 98–111)
Creatinine, Ser: 1.03 mg/dL — ABNORMAL HIGH (ref 0.44–1.00)
GFR, Estimated: >60 mL/min (ref >60)
Glucose, Bld: 115 mg/dL — ABNORMAL HIGH (ref 70–99)
Potassium: 3.9 mmol/L (ref 3.5–5.1)
Sodium: 138 mmol/L (ref 135–145)

## 2021-12-15 LAB — CBC
HCT: 34 % — ABNORMAL LOW (ref 36.0–46.0)
Hemoglobin: 12 g/dL (ref 12.0–15.0)
MCH: 31.1 pg (ref 26.0–34.0)
MCHC: 35.3 g/dL (ref 30.0–36.0)
MCV: 88.1 fL (ref 80.0–100.0)
Platelets: 245 10*3/uL (ref 150–400)
RBC: 3.86 MIL/uL — ABNORMAL LOW (ref 3.87–5.11)
RDW: 13.9 % (ref 11.5–15.5)
WBC: 12.1 10*3/uL — ABNORMAL HIGH (ref 4.0–10.5)
nRBC: 0 % (ref 0.0–0.2)

## 2021-12-15 LAB — MAGNESIUM: Magnesium: 2.2 mg/dL (ref 1.7–2.4)

## 2021-12-15 NOTE — Progress Notes (Signed)
PROGRESS NOTE    [name]  EYC:[id number] DOB: 1970-03-31 DOA: 12/11/2021 PCP: [place], Betty G, MD    Brief Narrative:  [name] is a 51 years old female with past medical history obstructive sleep apnea/OHS on CPAP presented to our hospital on 12/11/2021 with abdominal pain.   CT scan of the abdomen pelvis showed free gas and fluid.  Patient was seen by general surgery and was taken emergently to the OR.  There was finding of well-formed abscess cavity in the lower mid abdomen near the sigmoid colon with purulent peritonitis and fibrinous exudate throughout the abdomen.  Findings was suspected secondary to perforated sigmoid diverticulitis.  She had laparoscopic peritoneal lavage with drains placed.  Patient was intubated postoperatively and was initially in the ICU and was subsequently was transferred to our service after successful extubation.   During hospitalization, general surgery has followed the patient.  Patient still has 3 peritoneal drains and has been started on clears.   Assessment and Plan:  Principal Problem:   Pneumoperitoneum Active Problems:   Morbid obesity ([place])   Hyperlipidemia   Type 2 diabetes mellitus with other specified complication (HCC)   OSA on CPAP   Essential hypertension, benign   Bipolar I disorder (HCC)   Anxiety state   Chronic combined systolic and diastolic CHF (congestive heart failure) (HCC)   Colitis   Lactic acidosis   Perforation of sigmoid colon due to diverticulitis   Presumed perforated sigmoid diverticulitis with peritonitis and abscess - s/p laparascopic peritoneal lavage with drain placement. Patient is currently has been started on clears.  Off NG tube.  Continue IV Zosyn.  Blood cultures showing gram-negative rods.  Abdominal abscess fluid with fungal culture pending, aerobic/anaerobic culture with  E. coli.  Currently on Zosyn.  We will continue Zosyn for now.  Continue to monitor closely.  Patient seems to be  clinically improving.  Hx OSA/OHS/Acute Hypoxic Respiratory Failure CPAP at home.  Due to abdominal surgery and NG tube, nasal cannula oxygen, continue DuoNebs, bronchial hygiene.    Hypokalemia. Replenished and improved. Potassium of 3.9  Acute kidney injury. Received Ringer lactate.  Continue to monitor intake and output charting.  Creatinine today at 1.0 from 1.4.  Trending down.  Patient is positive balance for 4991 mL.  We will discontinue Ringer lactate since he has been started on clears.  Lab Results  Component Value Date   CREATININE 1.03 (H) 12/15/2021   CREATININE 1.36 (H) 12/14/2021   CREATININE 1.40 (H) 12/13/2021    Hx HFpEF/hypertension, hyperlipidemia 20 Echo from Feb 2022 with EF 60-65%, G1DD), Lasix on hold.  Continue IV fluids today.   Hx prediabetes. Continue sliding scale insulin.  Controlled.   Hx Seizures, Bipolar disorder, depression. Patient is on alprazolam, Adderall, Clonazepam, Lyrica, Vraylar at home.  Alprazolam Lyrica and vraylar has been restarted.  Morbid obesity. Body mass index is 50.85 kg/m.  Would benefit from weight loss as outpatient.   DVT prophylaxis: heparin injection 5,000 Units Start: 12/12/21 0600   Code Status:     Code Status: Full code  Disposition:  Likely home with home health as per PT recommendation in 2 to 3 days.  Status is: Inpatient  Remains inpatient appropriate because: Status post abdominal surgery, clears, abdominal drains, IV antibiotic, uremia   Family Communication:  None at bedside.  Consultants:  General surgery. PCCM  Procedures:  Laparoscopic peritoneal lavage with drain placement on 12/11/2021. Intubation and subsequent extubation. NG tube placement and self removal  Antimicrobials:  Zosyn IV 11/13>  Anti-infectives (From admission, onward)    Start     Dose/Rate Route Frequency Ordered Stop   12/12/21 0600  ceFEPIme (MAXIPIME) 2 g in sodium chloride 0.9 % 100 mL IVPB  Status:   Discontinued        2 g 200 mL/hr over 30 Minutes Intravenous Every 12 hours 12/11/21 2129 12/11/21 2323   12/12/21 0600  piperacillin-tazobactam (ZOSYN) IVPB 3.375 g        3.375 g 12.5 mL/hr over 240 Minutes Intravenous Every 8 hours 12/11/21 2326     12/11/21 2330  piperacillin-tazobactam (ZOSYN) IVPB 3.375 g        3.375 g 100 mL/hr over 30 Minutes Intravenous To Post Anesthesia Care Unit 12/11/21 2326 12/12/21 0041   12/11/21 1630  metroNIDAZOLE (FLAGYL) IVPB 500 mg        500 mg 100 mL/hr over 60 Minutes Intravenous  Once 12/11/21 1619 12/11/21 2049   12/11/21 1630  ceFEPIme (MAXIPIME) 2 g in sodium chloride 0.9 % 100 mL IVPB        2 g 200 mL/hr over 30 Minutes Intravenous  Once 12/11/21 1623 12/11/21 1817      Subjective: Today, patient was seen and examined at bedside.  Inquiring about going home.  Denies any nausea vomiting fever or chills.  Slept okay.  Has been passing gas no bowel movement.   Objective: Vitals:   12/14/21 1654 12/14/21 2025 12/15/21 0000 12/15/21 0416  BP: (!) 145/85 (!) 145/78  (!) 150/92  Pulse: 83 72 78 70  Resp: '18 19 20 16  '$ Temp: 98.1 F (36.7 C) 98 F (36.7 C)  97.7 F (36.5 C)  TempSrc: Oral Oral  Oral  SpO2: 95% 96% 95% 95%  Weight:      Height:       No intake or output data in the 24 hours ending 12/15/21 0957  Filed Weights   12/11/21 2114  Weight: (!) 138.6 kg    Physical Examination: Body mass index is 50.85 kg/m.   General: Morbidly obese, alert awake, sitting up in bed, Communicative.  HENT:   No scleral pallor or icterus noted. Oral mucosa is moist.  Place, Chest:    Diminished breath sounds bilaterally.  CVS: S1 &S2 heard. No murmur.  Regular rate and rhythm. Abdomen: Soft, obese, appropriate tenderness on palpation, JP drain x3 with serosanguineous discharge.   Extremities: No cyanosis, clubbing or edema.  Peripheral pulses are palpable. Psych: Alert, awake and Communicative, CNS:  No cranial nerve deficits.  Power  equal in all extremities.   Skin: Warm and dry.  Abdominal wall with drains.   Data Reviewed:   CBC: Recent Labs  Lab 12/12/21 0145 12/12/21 1215 12/13/21 0753 12/14/21 0602 12/15/21 0740  WBC 13.6* 18.8* 18.1* 12.8* 12.1*  HGB 13.2 13.4 12.1 12.6 12.0  HCT 38.5 38.5 34.5* 37.7 34.0*  MCV 90.8 88.7 88.7 89.8 88.1  PLT 281 308 292 288 245     Basic Metabolic Panel: Recent Labs  Lab 12/11/21 1625 12/11/21 1631 12/12/21 0145 12/12/21 1215 12/13/21 0753 12/14/21 0602 12/15/21 0740  NA 143   < > 140 142 142 139 138  K 3.4*   < > 4.1 4.4 4.2 3.7 3.9  CL 105   < > 100 101 102 102 100  CO2 20*  --  '24 28 27 28 26  '$ GLUCOSE 203*   < > 151* 152* 142* 124* 115*  BUN 14   < >  $'15 17 20 'f$ 22* 16  CREATININE 1.78*   < > 1.96* 1.76* 1.40* 1.36* 1.03*  CALCIUM 9.0  --  9.1 8.8* 8.3* 8.4* 8.2*  MG 1.8  --  1.7  --  2.6*  --  2.2  PHOS  --   --  4.6  --   --   --   --    < > = values in this interval not displayed.     Liver Function Tests: Recent Labs  Lab 12/11/21 1625  AST 33  ALT 21  ALKPHOS 109  BILITOT 0.5  PROT 6.0*  ALBUMIN 2.0*      Radiology Studies: No results found.    LOS: 4 days    Jessica Lipps, MD [facility] Available via Epic secure chat 7am-7pm After these hours, please refer to coverage provider listed on [email or web address] 12/15/2021, 9:57 AM

## 2021-12-15 NOTE — Plan of Care (Signed)

## 2021-12-15 NOTE — Progress Notes (Signed)
PHARMACY - PHYSICIAN COMMUNICATION CRITICAL VALUE ALERT - BLOOD CULTURE IDENTIFICATION (BCID)  [name] is an 51 y.o. female with intra-abdominal abscess s/p washout and drain placement, peritoneal fluid growing E. Coli.   Assessment:   Notified that blood culture drawn 11/13 now growing gram negative rods, but organism not identified by [name]  Name of physician (or Provider) Contacted:  Dr. Bridgett Larsson  Current antibiotics: Zosyn  Changes to prescribed antibiotics recommended:  Continue Zosyn  F/U blood cultures for organism identification and sensitivities  No results found. However, due to the size of the patient record, not all encounters were searched. Please check Results Review for a complete set of results.  [name] 12/15/2021  1:53 AM

## 2021-12-15 NOTE — Discharge Instructions (Signed)
[facility]  Please arrive at least 30 min before your appointment to complete your check in paperwork.  If you are unable to arrive 30 min prior to your appointment time we may have to cancel or reschedule you. LAPAROSCOPIC SURGERY: POST OP INSTRUCTIONS Always review your discharge instruction sheet given to you by the facility where your surgery was performed. IF YOU HAVE DISABILITY OR FAMILY LEAVE FORMS, YOU MUST BRING THEM TO THE OFFICE FOR PROCESSING.   DO NOT GIVE THEM TO YOUR DOCTOR.  PAIN CONTROL  First take acetaminophen (Tylenol) AND/or ibuprofen (Advil) to control your pain after surgery.  Follow directions on package.  Taking acetaminophen (Tylenol) and/or ibuprofen (Advil) regularly after surgery will help to control your pain and lower the amount of prescription pain medication you may need.  You should not take more than 4,000 mg (4 grams) of acetaminophen (Tylenol) in 24 hours.  You should not take ibuprofen (Advil), aleve, motrin, naprosyn or other NSAIDS if you have a history of stomach ulcers or chronic kidney disease.  A prescription for pain medication may be given to you upon discharge.  Take your pain medication as prescribed, if you still have uncontrolled pain after taking acetaminophen (Tylenol) or ibuprofen (Advil). Use ice packs to help control pain. If you need a refill on your pain medication, please contact your pharmacy.  They will contact our office to request authorization. Prescriptions will not be filled after 5pm or on week-ends.  HOME MEDICATIONS Take your usually prescribed medications unless otherwise directed.  DIET You should follow a light diet the first few days after arrival home.  Be sure to include lots of fluids daily. Avoid fatty, fried foods.   CONSTIPATION It is common to experience some constipation after surgery and if you are taking pain medication.  Increasing fluid intake and taking a stool softener (such as Colace)  will usually help or prevent this problem from occurring.  A mild laxative (Milk of Magnesia or Miralax) should be taken according to package instructions if there are no bowel movements after 48 hours.  WOUND/INCISION CARE Most patients will experience some swelling and bruising in the area of the incisions.  Ice packs will help.  Swelling and bruising can take several days to resolve.  Unless discharge instructions indicate otherwise, follow guidelines below  STERI-STRIPS - you may remove your outer bandages 48 hours after surgery, and you may shower at that time.  You have steri-strips (small skin tapes) in place directly over the incision.  These strips should be left on the skin for 7-10 days.   DERMABOND/SKIN GLUE - you may shower in 24 hours.  The glue will flake off over the next 2-3 weeks. Any sutures or staples will be removed at the office during your follow-up visit.  ACTIVITIES You may resume regular (light) daily activities beginning the next day--such as daily self-care, walking, climbing stairs--gradually increasing activities as tolerated.  You may have sexual intercourse when it is comfortable.  Refrain from any heavy lifting or straining until approved by your doctor. You may drive when you are no longer taking prescription pain medication, you can comfortably wear a seatbelt, and you can safely maneuver your car and apply brakes.  FOLLOW-UP You should see your doctor in the office for a follow-up appointment approximately 2-3 weeks after your surgery.  You should have been given your post-op/follow-up appointment when your surgery was scheduled.  If you did not receive a post-op/follow-up appointment, make sure   that you call for this appointment within a day or two after you arrive home to insure a convenient appointment time.   WHEN TO CALL YOUR DOCTOR: Fever over 101.0 Inability to urinate Continued bleeding from incision. Increased pain, redness, or drainage from the  incision. Increasing abdominal pain  The clinic staff is available to answer your questions during regular business hours.  Please don't hesitate to call and ask to speak to one of the nurses for clinical concerns.  If you have a medical emergency, go to the nearest emergency room or call 911.  A surgeon from [facility] is always on call at the hospital. [address], [address], [place], [place]  [address] ? [address] [address], [place], [place]   [address] [phone] ? [phone] ? [phone]  

## 2021-12-15 NOTE — Care Management Important Message (Signed)
Important Message  Patient Details  Name: [name] MRN: [id number] Date of Birth: Jul 27, 1970   Medicare Important Message Given:  Yes     Charmian Forbis Montine Circle 12/15/2021, 11:33 AM

## 2021-12-15 NOTE — Progress Notes (Signed)
PT Cancellation Note  Patient Details Name: [name] MRN: [id number] DOB: October 12, 1970   Cancelled Treatment:    Reason Eval/Treat Not Completed: Patient declined, no reason specified Patient refusing mobility stating "I'm about to take sleepy pills". Will check back at later date.   [name] A. Gilford Rile PT, DPT Acute Rehabilitation Services Office [phone]    Linna Hoff 12/15/2021, 4:38 PM

## 2021-12-15 NOTE — Progress Notes (Signed)
General Surgery Follow Up Note  Subjective:    Overnight Issues:   Objective:  Vital signs for last 24 hours: Temp:  [97.7 F (36.5 C)-98.1 F (36.7 C)] 97.7 F (36.5 C) (11/17 0416) Pulse Rate:  [70-83] 70 (11/17 0416) Resp:  [16-20] 16 (11/17 0416) BP: (145-150)/(78-92) 150/92 (11/17 0416) SpO2:  [95 %-96 %] 95 % (11/17 0416)  Hemodynamic parameters for last 24 hours:    Intake/Output from previous day: No intake/output data recorded.  Intake/Output this shift: No intake/output data recorded.  Vent settings for last 24 hours:    Physical Exam:  Gen: comfortable, no distress Neuro: non-focal exam HEENT: PERRL Neck: supple CV: RRR Pulm: unlabored breathing Abd: soft, NT, drain SS GU: clear yellow urine Extr: wwp, no edema   Results for orders placed or performed during the hospital encounter of 12/11/21 (from the past 24 hour(s))  Glucose, capillary     Status: Abnormal   Collection Time: 12/14/21 11:32 AM  Result Value Ref Range   Glucose-Capillary 125 (H) 70 - 99 mg/dL  Glucose, capillary     Status: Abnormal   Collection Time: 12/14/21  4:15 PM  Result Value Ref Range   Glucose-Capillary 143 (H) 70 - 99 mg/dL  Glucose, capillary     Status: Abnormal   Collection Time: 12/14/21  8:25 PM  Result Value Ref Range   Glucose-Capillary 104 (H) 70 - 99 mg/dL  Glucose, capillary     Status: Abnormal   Collection Time: 12/15/21 12:19 AM  Result Value Ref Range   Glucose-Capillary 138 (H) 70 - 99 mg/dL  Glucose, capillary     Status: None   Collection Time: 12/15/21  4:12 AM  Result Value Ref Range   Glucose-Capillary 87 70 - 99 mg/dL  Glucose, capillary     Status: Abnormal   Collection Time: 12/15/21  7:30 AM  Result Value Ref Range   Glucose-Capillary 110 (H) 70 - 99 mg/dL  Basic metabolic panel     Status: Abnormal   Collection Time: 12/15/21  7:40 AM  Result Value Ref Range   Sodium 138 135 - 145 mmol/L   Potassium 3.9 3.5 - 5.1 mmol/L   Chloride  100 98 - 111 mmol/L   CO2 26 22 - 32 mmol/L   Glucose, Bld 115 (H) 70 - 99 mg/dL   BUN 16 6 - 20 mg/dL   Creatinine, Ser 1.03 (H) 0.44 - 1.00 mg/dL   Calcium 8.2 (L) 8.9 - 10.3 mg/dL   GFR, Estimated >60 >60 mL/min   Anion gap 12 5 - 15  CBC     Status: Abnormal   Collection Time: 12/15/21  7:40 AM  Result Value Ref Range   WBC 12.1 (H) 4.0 - 10.5 K/uL   RBC 3.86 (L) 3.87 - 5.11 MIL/uL   Hemoglobin 12.0 12.0 - 15.0 g/dL   HCT 34.0 (L) 36.0 - 46.0 %   MCV 88.1 80.0 - 100.0 fL   MCH 31.1 26.0 - 34.0 pg   MCHC 35.3 30.0 - 36.0 g/dL   RDW 13.9 11.5 - 15.5 %   Platelets 245 150 - 400 K/uL   nRBC 0.0 0.0 - 0.2 %  Magnesium     Status: None   Collection Time: 12/15/21  7:40 AM  Result Value Ref Range   Magnesium 2.2 1.7 - 2.4 mg/dL    Assessment & Plan:  Present on Admission:  Anxiety state  Bipolar I disorder (HCC)  Chronic combined systolic and diastolic  CHF (congestive heart failure) (HCC)  Essential hypertension, benign  Hyperlipidemia  Morbid obesity ([place])  Type 2 diabetes mellitus with other specified complication ([place])    LOS: 4 days   Additional comments:I reviewed the patient's new clinical lab test results.   and I reviewed the patients new imaging test results.     POD 4, s/p diagnostic lap washout, evacuation of abscess, and JP drain placement x2, Dr. Thermon Leyland 11/13 -multi-modal pain control - passing flatus, no BM -pulm toilet, mobilize -cont JP drains which are serous to serosang right now. Empty and record o/p every shift -unclear etiology of source of infection, likely colon but no perf noted at time of surgery. [name]   FEN - okay for CLD, AcROBF VTE - heparin ID - zosyn, noted GNR bacteremia, recommend recollection of bcx now that she has been on abx, entirely possible this was a transient bacteremia 2/2 perf diverticulitis   HTN Morbid obesity OSA CHF Bipolar disorder HLD Seizures   Jesusita Oka, MD Trauma & General Surgery Please use  [email or web address] to contact on call provider  12/15/2021  *Care during the described time interval was provided by me. I have reviewed this patient's available data, including medical history, events of note, physical examination and test results as part of my evaluation.

## 2021-12-16 DIAGNOSIS — I5042 Chronic combined systolic (congestive) and diastolic (congestive) heart failure: Secondary | ICD-10-CM | POA: Diagnosis not present

## 2021-12-16 DIAGNOSIS — K668 Other specified disorders of peritoneum: Secondary | ICD-10-CM | POA: Diagnosis not present

## 2021-12-16 DIAGNOSIS — F319 Bipolar disorder, unspecified: Secondary | ICD-10-CM | POA: Diagnosis not present

## 2021-12-16 DIAGNOSIS — F411 Generalized anxiety disorder: Secondary | ICD-10-CM | POA: Diagnosis not present

## 2021-12-16 LAB — CULTURE, BLOOD (ROUTINE X 2)

## 2021-12-16 LAB — BASIC METABOLIC PANEL
Anion gap: 13 (ref 5–15)
BUN: 11 mg/dL (ref 6–20)
CO2: 27 mmol/L (ref 22–32)
Calcium: 8.2 mg/dL — ABNORMAL LOW (ref 8.9–10.3)
Chloride: 101 mmol/L (ref 98–111)
Creatinine, Ser: 0.92 mg/dL (ref 0.44–1.00)
GFR, Estimated: >60 mL/min (ref >60)
Glucose, Bld: 134 mg/dL — ABNORMAL HIGH (ref 70–99)
Potassium: 3.9 mmol/L (ref 3.5–5.1)
Sodium: 141 mmol/L (ref 135–145)

## 2021-12-16 LAB — GLUCOSE, CAPILLARY
Glucose-Capillary: 115 mg/dL — ABNORMAL HIGH (ref 70–99)
Glucose-Capillary: 125 mg/dL — ABNORMAL HIGH (ref 70–99)
Glucose-Capillary: 127 mg/dL — ABNORMAL HIGH (ref 70–99)
Glucose-Capillary: 133 mg/dL — ABNORMAL HIGH (ref 70–99)
Glucose-Capillary: 136 mg/dL — ABNORMAL HIGH (ref 70–99)
Glucose-Capillary: 168 mg/dL — ABNORMAL HIGH (ref 70–99)

## 2021-12-16 LAB — MAGNESIUM: Magnesium: 2.1 mg/dL (ref 1.7–2.4)

## 2021-12-16 LAB — CBC
HCT: 39.1 % (ref 36.0–46.0)
Hemoglobin: 12.7 g/dL (ref 12.0–15.0)
MCH: 30.4 pg (ref 26.0–34.0)
MCHC: 32.5 g/dL (ref 30.0–36.0)
MCV: 93.5 fL (ref 80.0–100.0)
Platelets: 282 10*3/uL (ref 150–400)
RBC: 4.18 MIL/uL (ref 3.87–5.11)
RDW: 14.3 % (ref 11.5–15.5)
WBC: 12.5 10*3/uL — ABNORMAL HIGH (ref 4.0–10.5)
nRBC: 0 % (ref 0.0–0.2)

## 2021-12-16 NOTE — Progress Notes (Signed)
PROGRESS NOTE    [name]  YQI:[id number] DOB: January 27, 1971 DOA: 12/11/2021 PCP: [place], Betty G, MD    Brief Narrative:  [name] is a 51 years old female with past medical history obstructive sleep apnea/OHS on CPAP presented to our hospital on 12/11/2021 with abdominal pain.   CT scan of the abdomen pelvis showed free gas and fluid.  Patient was seen by general surgery and was taken emergently to the OR.  There was finding of well-formed abscess cavity in the lower mid abdomen near the sigmoid colon with purulent peritonitis and fibrinous exudate throughout the abdomen.  Findings was suspected secondary to perforated sigmoid diverticulitis.  She had laparoscopic peritoneal lavage with drains placed.  Patient was intubated postoperatively and was initially in the ICU and was subsequently was transferred to our service after successful extubation.   During hospitalization, general surgery has followed the patient.  Patient still has 3 peritoneal drains and has been advanced on diet.   Assessment and Plan:  Principal Problem:   Pneumoperitoneum Active Problems:   Morbid obesity ([place])   Hyperlipidemia   Type 2 diabetes mellitus with other specified complication (HCC)   OSA on CPAP   Essential hypertension, benign   Bipolar I disorder (HCC)   Anxiety state   Chronic combined systolic and diastolic CHF (congestive heart failure) (HCC)   Colitis   Lactic acidosis   Perforation of sigmoid colon due to diverticulitis   Presumed perforated sigmoid diverticulitis with peritonitis and abscess - s/p laparascopic peritoneal lavage with drain placement. Pathogen advance to soft diet.  Continue IV Zosyn.  Blood cultures showing gram-negative rods.  Abdominal fluid with with  E. coli.  Blood culture with gram-negative rods.  Currently on IV Zosyn.  Clinically improving.  Hx OSA/OHS/Acute Hypoxic Respiratory Failure CPAP at home.   Hypokalemia. Replenished and improved.   Latest potassium of 3.9  Acute kidney injury. Improved after IV fluid..  Creatinine has normalized at this time.  Lab Results  Component Value Date   CREATININE 0.92 12/16/2021   CREATININE 1.03 (H) 12/15/2021   CREATININE 1.36 (H) 12/14/2021    Hx HFpEF/hypertension, hyperlipidemia 20 Echo from Feb 2022 with EF 60-65%, G1DD), Lasix on hold.  DC IV fluids.  Hx prediabetes. Continue sliding scale insulin.  Controlled.   Hx Seizures, Bipolar disorder, depression. Patient is on alprazolam, Adderall, Clonazepam, Lyrica, Vraylar at home.  Alprazolam Lyrica and vraylar has been restarted.  Morbid obesity. Body mass index is 50.85 kg/m.  Would benefit from weight loss as outpatient.   DVT prophylaxis: heparin injection 5,000 Units Start: 12/12/21 0600   Code Status:     Code Status: Full code  Disposition:  Likely home with home health as per PT recommendation in 2 to 3 days.  Status is: Inpatient  Remains inpatient appropriate because: Status post abdominal surgery, IV antibiotic, pending clinical improvement   Family Communication:  Spoke with the patient's relative.  Ms. Delsa Sale at bedside.  Consultants:  General surgery. PCCM  Procedures:  Laparoscopic peritoneal lavage with drain placement on 12/11/2021. Intubation and subsequent extubation. NG tube placement and self removal  Antimicrobials:  Zosyn IV 11/13>  Subjective: Today, patient was seen and examined at bedside.  Denies any nausea vomiting abdominal pain.  Has had a bowel movement.  Overall feels better.  Patient's family at bedside.  No chest pain shortness of breath.    Objective: Vitals:   12/15/21 1744 12/15/21 2035 12/16/21 0500 12/16/21 0916  BP: [name])  134/92 (!) 147/73 (!) 156/88 (!) 143/68  Pulse: 85 82 81 83  Resp:    20  Temp: 99 F (37.2 C) (!) 97.5 F (36.4 C) 97.6 F (36.4 C) 99.5 F (37.5 C)  TempSrc: Oral Oral Oral Oral  SpO2: 93% 94%  92%  Weight:      Height:         Intake/Output Summary (Last 24 hours) at 12/16/2021 1102 Last data filed at 12/16/2021 0403 Gross per 24 hour  Intake 641.6 ml  Output 85 ml  Net 556.6 ml    Filed Weights   12/11/21 2114  Weight: (!) 138.6 kg    Physical Examination: Body mass index is 50.85 kg/m.   General: Morbidly obese, alert awake and Communicative.   HENT:   No scleral pallor or icterus noted. Oral mucosa is moist.  Place, Chest:    Diminished breath sounds bilaterally.  No crackles or wheezes. CVS: S1 &S2 heard. No murmur.  Regular rate and rhythm. Abdomen: Soft, obese, appropriate tenderness on palpation, JP drain x3 Extremities: No cyanosis, clubbing or edema.  Peripheral pulses are palpable. Psych: Alert, awake and Communicative, CNS:  No cranial nerve deficits.  Power equal in all extremities.   Skin: Warm and dry.  Abdominal wall with JP drain.   Data Reviewed:   CBC: Recent Labs  Lab 12/12/21 1215 12/13/21 0753 12/14/21 0602 12/15/21 0740 12/16/21 0908  WBC 18.8* 18.1* 12.8* 12.1* 12.5*  HGB 13.4 12.1 12.6 12.0 12.7  HCT 38.5 34.5* 37.7 34.0* 39.1  MCV 88.7 88.7 89.8 88.1 93.5  PLT 308 292 288 245 282     Basic Metabolic Panel: Recent Labs  Lab 12/11/21 1625 12/11/21 1631 12/12/21 0145 12/12/21 1215 12/13/21 0753 12/14/21 0602 12/15/21 0740 12/16/21 0908  NA 143   < > 140 142 142 139 138 141  K 3.4*   < > 4.1 4.4 4.2 3.7 3.9 3.9  CL 105   < > 100 101 102 102 100 101  CO2 20*  --  '24 28 27 28 26 27  '$ GLUCOSE 203*   < > 151* 152* 142* 124* 115* 134*  BUN 14   < > '15 17 20 '$ 22* 16 11  CREATININE 1.78*   < > 1.96* 1.76* 1.40* 1.36* 1.03* 0.92  CALCIUM 9.0  --  9.1 8.8* 8.3* 8.4* 8.2* 8.2*  MG 1.8  --  1.7  --  2.6*  --  2.2 2.1  PHOS  --   --  4.6  --   --   --   --   --    < > = values in this interval not displayed.     Liver Function Tests: Recent Labs  Lab 12/11/21 1625  AST 33  ALT 21  ALKPHOS 109  BILITOT 0.5  PROT 6.0*  ALBUMIN 2.0*       Radiology Studies: No results found.    LOS: 5 days    Flora Lipps, MD [facility] Available via Epic secure chat 7am-7pm After these hours, please refer to coverage provider listed on [email or web address] 12/16/2021, 11:02 AM

## 2021-12-16 NOTE — Progress Notes (Signed)
Mobility Specialist Progress Note:   12/16/21 1124  Mobility  Activity Ambulated with assistance in room  Level of Assistance Contact guard assist, steadying assist  Assistive Device Front wheel walker  Distance Ambulated (ft) 50 ft  Activity Response Tolerated well  Mobility Referral Yes  $Mobility charge 1 Mobility   Pt received in bed and agreeable. Pt was anxious, requiring encouragement and reassurance throughout ambulation. Declined hallway ambulation d/t people seeing her appearance. Pt left sitting EOB with all needs met, call bell in reach, and family in room.   Andrey Campanile Mobility Specialist Please contact via SecureChat or  Rehab office at [phone]

## 2021-12-16 NOTE — Progress Notes (Signed)
General Surgery Follow Up Note  Subjective:    Overnight Issues: Tolerating clear liquids. Patient reports she is passing flatus. Denies nausea/vomiting. Afebrile.  Objective:  Vital signs for last 24 hours: Temp:  [97.3 F (36.3 C)-99.5 F (37.5 C)] 99.5 F (37.5 C) (11/18 0916) Pulse Rate:  [77-85] 83 (11/18 0916) Resp:  [18-20] 20 (11/18 0916) BP: (134-156)/(68-92) 143/68 (11/18 0916) SpO2:  [92 %-97 %] 92 % (11/18 0916)  Hemodynamic parameters for last 24 hours:    Intake/Output from previous day: 11/17 0701 - 11/18 0700 In: 641.6 [P.O.:300; IV Piggyback:341.6] Out: 160 [Drains:160]  Intake/Output this shift: No intake/output data recorded.  Vent settings for last 24 hours:    Physical Exam:  Gen: comfortable, no distress Neuro: no focal deficits Pulm: nonlabored respirations on room air Abd: soft, nontender, nondistended. Incisions clean and dry with no erythema or induration. JP x3 with serosanguinous drainage.   Results for orders placed or performed during the hospital encounter of 12/11/21 (from the past 24 hour(s))  Glucose, capillary     Status: Abnormal   Collection Time: 12/15/21 11:21 AM  Result Value Ref Range   Glucose-Capillary 117 (H) 70 - 99 mg/dL  Glucose, capillary     Status: Abnormal   Collection Time: 12/15/21  4:06 PM  Result Value Ref Range   Glucose-Capillary 157 (H) 70 - 99 mg/dL  Glucose, capillary     Status: Abnormal   Collection Time: 12/15/21  8:39 PM  Result Value Ref Range   Glucose-Capillary 155 (H) 70 - 99 mg/dL  Glucose, capillary     Status: Abnormal   Collection Time: 12/16/21 12:03 AM  Result Value Ref Range   Glucose-Capillary 115 (H) 70 - 99 mg/dL  Glucose, capillary     Status: Abnormal   Collection Time: 12/16/21  3:57 AM  Result Value Ref Range   Glucose-Capillary 136 (H) 70 - 99 mg/dL  Glucose, capillary     Status: Abnormal   Collection Time: 12/16/21  7:28 AM  Result Value Ref Range   Glucose-Capillary  125 (H) 70 - 99 mg/dL  Basic metabolic panel     Status: Abnormal   Collection Time: 12/16/21  9:08 AM  Result Value Ref Range   Sodium 141 135 - 145 mmol/L   Potassium 3.9 3.5 - 5.1 mmol/L   Chloride 101 98 - 111 mmol/L   CO2 27 22 - 32 mmol/L   Glucose, Bld 134 (H) 70 - 99 mg/dL   BUN 11 6 - 20 mg/dL   Creatinine, Ser 0.92 0.44 - 1.00 mg/dL   Calcium 8.2 (L) 8.9 - 10.3 mg/dL   GFR, Estimated >60 >60 mL/min   Anion gap 13 5 - 15  CBC     Status: Abnormal   Collection Time: 12/16/21  9:08 AM  Result Value Ref Range   WBC 12.5 (H) 4.0 - 10.5 K/uL   RBC 4.18 3.87 - 5.11 MIL/uL   Hemoglobin 12.7 12.0 - 15.0 g/dL   HCT 39.1 36.0 - 46.0 %   MCV 93.5 80.0 - 100.0 fL   MCH 30.4 26.0 - 34.0 pg   MCHC 32.5 30.0 - 36.0 g/dL   RDW 14.3 11.5 - 15.5 %   Platelets 282 150 - 400 K/uL   nRBC 0.0 0.0 - 0.2 %  Magnesium     Status: None   Collection Time: 12/16/21  9:08 AM  Result Value Ref Range   Magnesium 2.1 1.7 - 2.4 mg/dL  Assessment & Plan:  Present on Admission:  Anxiety state  Bipolar I disorder ([place])  Chronic combined systolic and diastolic CHF (congestive heart failure) (HCC)  Essential hypertension, benign  Hyperlipidemia  Morbid obesity ([place])  Type 2 diabetes mellitus with other specified complication ([place])    LOS: 5 days   Additional comments:I reviewed the patient's new clinical lab test results.   and I reviewed the patients new imaging test results.     POD 5, s/p diagnostic lap washout, evacuation of abscess, and JP drain placement x2, Dr. Thermon Leyland 11/13 -multi-modal pain control -Having bowel function -pulm toilet, needs to mobilize more -Continue JP drains, if they remain serosanguinous with oral intake will remove.   FEN - Advance to soft diet VTE - heparin ID - Antibiotics for treatment of GNR bacteremia. This was possibly a transient bacteremia 2/2 perf diverticulitis   HTN Morbid obesity OSA CHF Bipolar disorder HLD Seizures  Michaelle Birks, MD [facility] Surgery General, Hepatobiliary and Pancreatic Surgery 12/16/21 10:08 AM

## 2021-12-17 DIAGNOSIS — F319 Bipolar disorder, unspecified: Secondary | ICD-10-CM | POA: Diagnosis not present

## 2021-12-17 DIAGNOSIS — I5042 Chronic combined systolic (congestive) and diastolic (congestive) heart failure: Secondary | ICD-10-CM | POA: Diagnosis not present

## 2021-12-17 DIAGNOSIS — F411 Generalized anxiety disorder: Secondary | ICD-10-CM | POA: Diagnosis not present

## 2021-12-17 DIAGNOSIS — K668 Other specified disorders of peritoneum: Secondary | ICD-10-CM | POA: Diagnosis not present

## 2021-12-17 LAB — GLUCOSE, CAPILLARY
Glucose-Capillary: 129 mg/dL — ABNORMAL HIGH (ref 70–99)
Glucose-Capillary: 137 mg/dL — ABNORMAL HIGH (ref 70–99)
Glucose-Capillary: 138 mg/dL — ABNORMAL HIGH (ref 70–99)
Glucose-Capillary: 142 mg/dL — ABNORMAL HIGH (ref 70–99)
Glucose-Capillary: 146 mg/dL — ABNORMAL HIGH (ref 70–99)
Glucose-Capillary: 151 mg/dL — ABNORMAL HIGH (ref 70–99)
Glucose-Capillary: 181 mg/dL — ABNORMAL HIGH (ref 70–99)

## 2021-12-17 LAB — CULTURE, BLOOD (ROUTINE X 2): Special Requests: ADEQUATE

## 2021-12-17 NOTE — Progress Notes (Signed)
PROGRESS NOTE    [name]  WUJ:[id number] DOB: 01-21-1971 DOA: 12/11/2021 PCP: [place], Betty G, MD    Brief Narrative:  [name] is a 51 years old female with past medical history obstructive sleep apnea/OHS on CPAP presented to our hospital on 12/11/2021 with abdominal pain.   CT scan of the abdomen pelvis showed free gas and fluid.  Patient was seen by general surgery and was taken emergently to the OR.  There was finding of well-formed abscess cavity in the lower mid abdomen near the sigmoid colon with purulent peritonitis and fibrinous exudate throughout the abdomen.  Findings was suspected secondary to perforated sigmoid diverticulitis.  She had laparoscopic peritoneal lavage with drains placed.  Patient was intubated postoperatively and was initially in the ICU and was subsequently was transferred to our service after successful extubation.   During hospitalization, general surgery has followed the patient.  Patient still has 3 peritoneal drains and has been advanced on diet.   Assessment and Plan:  Principal Problem:   Pneumoperitoneum Active Problems:   Morbid obesity ([place])   Hyperlipidemia   Type 2 diabetes mellitus with other specified complication (HCC)   OSA on CPAP   Essential hypertension, benign   Bipolar I disorder (HCC)   Anxiety state   Chronic combined systolic and diastolic CHF (congestive heart failure) (HCC)   Colitis   Lactic acidosis   Perforation of sigmoid colon due to diverticulitis   Presumed perforated sigmoid diverticulitis with peritonitis and abscess - s/p laparascopic peritoneal lavage with drain placement. Has been advanced to soft diet.  Continue IV Zosyn.  Blood cultures showing gram-negative rods with bacteroids.  Abdominal fluid with with  E. coli.  Blood culture with gram-negative rods.  Currently on IV Zosyn.  Clinically improving.  Hx OSA/OHS/Acute Hypoxic Respiratory Failure CPAP at home.   Hypokalemia. Replenished  and improved.  Latest potassium of 3.9  Acute kidney injury. Improved after IV fluid..  Creatinine has normalized at this time.  Lab Results  Component Value Date   CREATININE 0.92 12/16/2021   CREATININE 1.03 (H) 12/15/2021   CREATININE 1.36 (H) 12/14/2021    Hx HFpEF/hypertension, hyperlipidemia 20 Echo from Feb 2022 with EF 60-65%, G1DD), Lasix on hold.  DC IV fluids.  Hx prediabetes. Continue sliding scale insulin.  Controlled.   Hx Seizures, Bipolar disorder, depression. Patient is on alprazolam, Adderall, Clonazepam, Lyrica, Vraylar at home.  Alprazolam Lyrica and vraylar has been restarted.  Morbid obesity. Body mass index is 50.85 kg/m.  Would benefit from weight loss as outpatient.   DVT prophylaxis: heparin injection 5,000 Units Start: 12/12/21 0600   Code Status:     Code Status: Full code  Disposition:  Likely home with home health as per PT recommendation in 1 to 2 days  Status is: Inpatient  Remains inpatient appropriate because: Status post abdominal surgery, IV antibiotic, pending clinical improvement   Family Communication:  Spoke with the patient's relative.  Ms. [name] at bedside on 12/16/2021.  Consultants:  General surgery. PCCM  Procedures:  Laparoscopic peritoneal lavage with drain placement on 12/11/2021. Intubation and subsequent extubation. NG tube placement and self removal  Antimicrobials:  Zosyn IV 11/13>  Subjective: Today, patient was seen and examined at bedside.  Denies any nausea vomiting fever chills or rigor.  Has had a bowel movement.  Denies overt pain.  Objective: Vitals:   12/16/21 2053 12/17/21 0051 12/17/21 0505 12/17/21 0855  BP: (!) 101/25  137/80 (!) 140/68  Pulse:  81 89 82 86  Resp: '20  19 20  '$ Temp: (!) 100.9 F (38.3 C) 97.6 F (36.4 C) 98.1 F (36.7 C) 98 F (36.7 C)  TempSrc: Oral Oral Oral Oral  SpO2: 96% 90% 94% 96%  Weight:      Height:        Intake/Output Summary (Last 24 hours) at  12/17/2021 1045 Last data filed at 12/17/2021 0900 Gross per 24 hour  Intake 720 ml  Output 65 ml  Net 655 ml    Filed Weights   12/11/21 2114  Weight: (!) 138.6 kg    Physical Examination: Body mass index is 50.85 kg/m.   General: Morbidly obese, alert awake and Communicative  HENT:   No scleral pallor or icterus noted. Oral mucosa is moist.  Chest.  Diminished breath sounds bilaterally. No crackles or wheezes.  CVS: S1 &S2 heard. No murmur.  Regular rate and rhythm. Abdomen: Soft, nontender, nondistended.  Bowel sounds are heard.  JP drain in place. Extremities: No cyanosis, clubbing or edema.  Peripheral pulses are palpable. Psych: Alert, awake and oriented, normal mood CNS:  No cranial nerve deficits.  Power equal in all extremities.   Skin: Warm and dry.  Abdominal wall with JP drain  Data Reviewed:   CBC: Recent Labs  Lab 12/12/21 1215 12/13/21 0753 12/14/21 0602 12/15/21 0740 12/16/21 0908  WBC 18.8* 18.1* 12.8* 12.1* 12.5*  HGB 13.4 12.1 12.6 12.0 12.7  HCT 38.5 34.5* 37.7 34.0* 39.1  MCV 88.7 88.7 89.8 88.1 93.5  PLT 308 292 288 245 282     Basic Metabolic Panel: Recent Labs  Lab 12/11/21 1625 12/11/21 1631 12/12/21 0145 12/12/21 1215 12/13/21 0753 12/14/21 0602 12/15/21 0740 12/16/21 0908  NA 143   < > 140 142 142 139 138 141  K 3.4*   < > 4.1 4.4 4.2 3.7 3.9 3.9  CL 105   < > 100 101 102 102 100 101  CO2 20*  --  '24 28 27 28 26 27  '$ GLUCOSE 203*   < > 151* 152* 142* 124* 115* 134*  BUN 14   < > '15 17 20 '$ 22* 16 11  CREATININE 1.78*   < > 1.96* 1.76* 1.40* 1.36* 1.03* 0.92  CALCIUM 9.0  --  9.1 8.8* 8.3* 8.4* 8.2* 8.2*  MG 1.8  --  1.7  --  2.6*  --  2.2 2.1  PHOS  --   --  4.6  --   --   --   --   --    < > = values in this interval not displayed.     Liver Function Tests: Recent Labs  Lab 12/11/21 1625  AST 33  ALT 21  ALKPHOS 109  BILITOT 0.5  PROT 6.0*  ALBUMIN 2.0*      Radiology Studies: No results found.    LOS: 6  days    Flora Lipps, MD [facility] Available via Epic secure chat 7am-7pm After these hours, please refer to coverage provider listed on [email or web address] 12/17/2021, 10:45 AM

## 2021-12-17 NOTE — Progress Notes (Signed)
Patient placed on full face cpap and tolerating well at this time

## 2021-12-17 NOTE — Progress Notes (Addendum)
   General Surgery Follow Up Note  Subjective:    Overnight Issues: Tolerating soft diet.  Moving better.  Objective:  Vital signs for last 24 hours: Temp:  [97.6 F (36.4 C)-100.9 F (38.3 C)] 98.1 F (36.7 C) (11/19 0505) Pulse Rate:  [81-99] 82 (11/19 0505) Resp:  [19-20] 19 (11/19 0505) BP: (101-137)/(25-80) 137/80 (11/19 0505) SpO2:  [86 %-96 %] 94 % (11/19 0505)   Intake/Output from previous day: 11/18 0701 - 11/19 0700 In: 960 [P.O.:960] Out: 65 [Drains:65]  Intake/Output this shift: No intake/output data recorded.   Physical Exam:  Gen: comfortable, no distress Abd: soft, nontender, nondistended, morbidly obese. Incisions clean and dry with no erythema or induration. JP x3 with serosanguinous drainage.   Results for orders placed or performed during the hospital encounter of 12/11/21 (from the past 24 hour(s))  Glucose, capillary     Status: Abnormal   Collection Time: 12/16/21 11:28 AM  Result Value Ref Range   Glucose-Capillary 133 (H) 70 - 99 mg/dL  Glucose, capillary     Status: Abnormal   Collection Time: 12/16/21  4:17 PM  Result Value Ref Range   Glucose-Capillary 127 (H) 70 - 99 mg/dL  Glucose, capillary     Status: Abnormal   Collection Time: 12/16/21  8:50 PM  Result Value Ref Range   Glucose-Capillary 168 (H) 70 - 99 mg/dL  Glucose, capillary     Status: Abnormal   Collection Time: 12/17/21 12:07 AM  Result Value Ref Range   Glucose-Capillary 146 (H) 70 - 99 mg/dL  Glucose, capillary     Status: Abnormal   Collection Time: 12/17/21  4:25 AM  Result Value Ref Range   Glucose-Capillary 151 (H) 70 - 99 mg/dL  Glucose, capillary     Status: Abnormal   Collection Time: 12/17/21  7:17 AM  Result Value Ref Range   Glucose-Capillary 137 (H) 70 - 99 mg/dL    Assessment & Plan:  Present on Admission:  Anxiety state  Bipolar I disorder (HCC)  Chronic combined systolic and diastolic CHF (congestive heart failure) (HCC)  Essential hypertension,  benign  Hyperlipidemia  Morbid obesity ([place])  Type 2 diabetes mellitus with other specified complication ([place])    LOS: 6 days   Additional comments:I reviewed the patient's new clinical lab test results.   and I reviewed the patients new imaging test results.     POD 6, s/p diagnostic lap washout, evacuation of abscess, and JP drain placement x2, Dr. Thermon Leyland 11/13 -multi-modal pain control -Having bowel function -pulm toilet, needs to mobilize more -Continue JP drains, if they remain serosanguinous with oral intake will remove prior to discharge -patient nearing stability for DC.  Needs a little more time with therapies it sounds like.  If continues to do well should be stable for surgical DC tomorrow and removal of drains   FEN - soft diet VTE - heparin ID - Antibiotics for treatment of GNR bacteremia. This was possibly a transient bacteremia 2/2 perf diverticulitis   HTN Morbid obesity OSA CHF Bipolar disorder HLD Seizures  Henreitta Cea, PA-C [facility] 12/17/21 10:01 AM

## 2021-12-17 NOTE — Progress Notes (Signed)
Mobility Specialist Progress Note:   12/17/21 1001  Mobility  Activity Ambulated with assistance in room  Level of Assistance Standby assist, set-up cues, supervision of patient - no hands on  Assistive Device None;Front wheel walker  Distance Ambulated (ft) 120 ft  Activity Response Tolerated well  Mobility Referral Yes  $Mobility charge 1 Mobility   Pt received in bed and agreeable. Ambulated well without walker but utilized for comfort. No complaints of pain or discomfort. Pt left sitting EOB with all needs met, call bell in reach, and family in room.     Mobility Specialist Please contact via SecureChat or  Rehab office at [phone]  

## 2021-12-18 ENCOUNTER — Inpatient Hospital Stay (HOSPITAL_COMMUNITY): Payer: Medicare PPO

## 2021-12-18 DIAGNOSIS — K668 Other specified disorders of peritoneum: Secondary | ICD-10-CM | POA: Diagnosis not present

## 2021-12-18 DIAGNOSIS — I5042 Chronic combined systolic (congestive) and diastolic (congestive) heart failure: Secondary | ICD-10-CM | POA: Diagnosis not present

## 2021-12-18 DIAGNOSIS — F411 Generalized anxiety disorder: Secondary | ICD-10-CM | POA: Diagnosis not present

## 2021-12-18 DIAGNOSIS — F319 Bipolar disorder, unspecified: Secondary | ICD-10-CM | POA: Diagnosis not present

## 2021-12-18 LAB — GLUCOSE, CAPILLARY
Glucose-Capillary: 113 mg/dL — ABNORMAL HIGH (ref 70–99)
Glucose-Capillary: 118 mg/dL — ABNORMAL HIGH (ref 70–99)
Glucose-Capillary: 139 mg/dL — ABNORMAL HIGH (ref 70–99)
Glucose-Capillary: 151 mg/dL — ABNORMAL HIGH (ref 70–99)
Glucose-Capillary: 154 mg/dL — ABNORMAL HIGH (ref 70–99)
Glucose-Capillary: 155 mg/dL — ABNORMAL HIGH (ref 70–99)
Glucose-Capillary: 161 mg/dL — ABNORMAL HIGH (ref 70–99)

## 2021-12-18 LAB — CBC
HCT: 39.2 % (ref 36.0–46.0)
Hemoglobin: 12.6 g/dL (ref 12.0–15.0)
MCH: 30.4 pg (ref 26.0–34.0)
MCHC: 32.1 g/dL (ref 30.0–36.0)
MCV: 94.7 fL (ref 80.0–100.0)
Platelets: 385 10*3/uL (ref 150–400)
RBC: 4.14 MIL/uL (ref 3.87–5.11)
RDW: 14.7 % (ref 11.5–15.5)
WBC: 13.3 10*3/uL — ABNORMAL HIGH (ref 4.0–10.5)
nRBC: 0 % (ref 0.0–0.2)

## 2021-12-18 LAB — MAGNESIUM: Magnesium: 2.1 mg/dL (ref 1.7–2.4)

## 2021-12-18 LAB — BASIC METABOLIC PANEL
Anion gap: 15 (ref 5–15)
BUN: 9 mg/dL (ref 6–20)
CO2: 19 mmol/L — ABNORMAL LOW (ref 22–32)
Calcium: 8.2 mg/dL — ABNORMAL LOW (ref 8.9–10.3)
Chloride: 107 mmol/L (ref 98–111)
Creatinine, Ser: 1.01 mg/dL — ABNORMAL HIGH (ref 0.44–1.00)
GFR, Estimated: >60 mL/min (ref >60)
Glucose, Bld: 134 mg/dL — ABNORMAL HIGH (ref 70–99)
Potassium: 4.2 mmol/L (ref 3.5–5.1)
Sodium: 141 mmol/L (ref 135–145)

## 2021-12-18 MED ORDER — IOHEXOL 9 MG/ML PO SOLN
500.0000 mL | ORAL | Status: AC
  Administered 2021-12-18 (×2): 500 mL via ORAL

## 2021-12-18 MED ORDER — IOHEXOL 350 MG/ML SOLN
100.0000 mL | Freq: Once | INTRAVENOUS | Status: AC | PRN
  Administered 2021-12-18: 100 mL via INTRAVENOUS

## 2021-12-18 MED ORDER — IOHEXOL 350 MG/ML SOLN
100.0000 mL | Freq: Once | INTRAVENOUS | Status: DC | PRN
  Filled 2021-12-18: qty 100

## 2021-12-18 MED ORDER — PANTOPRAZOLE SODIUM 40 MG PO TBEC
40.0000 mg | DELAYED_RELEASE_TABLET | Freq: Two times a day (BID) | ORAL | Status: DC
  Administered 2021-12-18 – 2022-01-05 (×37): 40 mg via ORAL
  Filled 2021-12-18 (×37): qty 1

## 2021-12-18 NOTE — Progress Notes (Signed)
Physical Therapy Treatment Patient Details Name: [name] MRN: [id number] DOB: 02-Aug-1970 Today's Date: 12/18/2021   History of Present Illness 51 y.o. female presents to [facility] hospital on 12/11/2021 with abdominal pain, found to have perforated sigmoid diverticulitis. Pt underwent laparoscopy with washout and drain placement on 11/13. Pt extubated 11/14. PMH includes OSA, systolic HF, anxiety, OA, asthma, bipolar disorder, depression.    PT Comments    Pt was seen for mobility on walker, took a short walk in her room and then completed a couple easy stretches.   Pt was able to do quad sets, DF stretches and glut sets with good follow through on them.  Pt was instructed to practice 2x a day and can use them to prep for standing with the staff.  Follow acutely for goals of PT as POC outlines.   Recommendations for follow up therapy are one component of a multi-disciplinary discharge planning process, led by the attending physician.  Recommendations may be updated based on patient status, additional functional criteria and insurance authorization.  Follow Up Recommendations  Home health PT     Assistance Recommended at Discharge Intermittent Supervision/Assistance  Patient can return home with the following A little help with walking and/or transfers;A little help with bathing/dressing/bathroom;Assistance with cooking/housework;Assist for transportation;Help with stairs or ramp for entrance   Equipment Recommendations  Rolling walker (2 wheels)    Recommendations for Other Services       Precautions / Restrictions Precautions Precautions: Fall Restrictions Weight Bearing Restrictions: No     Mobility  Bed Mobility Overal bed mobility: Needs Assistance Bed Mobility: Supine to Sit, Sit to Supine     Supine to sit: Min assist Sit to supine: Min assist   General bed mobility comments: assist to lift trunk then assist to return legs to bed    Transfers Overall  transfer level: Needs assistance Equipment used: Rolling walker (2 wheels) Transfers: Sit to/from Stand Sit to Stand: Min assist           General transfer comment: min assist to power up without AD    Ambulation/Gait Ambulation/Gait assistance: Min guard Gait Distance (Feet): 38 Feet Assistive device: Rolling walker (2 wheels) Gait Pattern/deviations: Step-through pattern, Decreased stride length, Wide base of support Gait velocity: decreased Gait velocity interpretation: <1.31 ft/sec, indicative of household ambulator Pre-gait activities: standing balance ck General Gait Details: reminders for obstacles with RW use   Stairs             Wheelchair Mobility    Modified Rankin (Stroke Patients Only)       Balance     Sitting balance-Leahy Scale: Good       Standing balance-Leahy Scale: Fair                              Cognition Arousal/Alertness: Awake/alert Behavior During Therapy: WFL for tasks assessed/performed Overall Cognitive Status: Impaired/Different from baseline Area of Impairment: Awareness, Problem solving, Safety/judgement, Following commands, Attention, Orientation, Memory                 Orientation Level: Situation Current Attention Level: Selective Memory: Decreased short-term memory, Decreased recall of precautions Following Commands: Follows one step commands with increased time Safety/Judgement: Decreased awareness of deficits Awareness: Intellectual Problem Solving: Slow processing, Requires verbal cues General Comments: pt is a bit self limiting        Exercises      General Comments General comments (skin integrity,  edema, etc.): pt was seen for movement of walker with minor cues for safety and set up to sit, very slow controlled descent to sit on bed      Pertinent Vitals/Pain Pain Assessment Pain Assessment: No/denies pain    Home Living                          Prior Function             PT Goals (current goals can now be found in the care plan section) Acute Rehab PT Goals Patient Stated Goal: be independent again    Frequency    Min 3X/week      PT Plan Current plan remains appropriate    Co-evaluation              AM-PAC PT "6 Clicks" Mobility   Outcome Measure  Help needed turning from your back to your side while in a flat bed without using bedrails?: A Little Help needed moving from lying on your back to sitting on the side of a flat bed without using bedrails?: A Little Help needed moving to and from a bed to a chair (including a wheelchair)?: A Little Help needed standing up from a chair using your arms (e.g., wheelchair or bedside chair)?: A Little Help needed to walk in hospital room?: A Little Help needed climbing 3-5 steps with a railing? : A Lot 6 Click Score: 17    End of Session Equipment Utilized During Treatment: Oxygen Activity Tolerance: Patient tolerated treatment well;Patient limited by fatigue Patient left: in bed;with call bell/phone within reach;with bed alarm set Nurse Communication: Mobility status PT Visit Diagnosis: Other abnormalities of gait and mobility (R26.89);Muscle weakness (generalized) (M62.81);Pain     Time: 7989-2119 PT Time Calculation (min) (ACUTE ONLY): 13 min  Charges:  $Gait Training: 8-22 mins     Ramond Dial 12/18/2021, 3:43 PM  Mee Hives, PT PhD Acute Rehab Dept. Number: [place]

## 2021-12-18 NOTE — Plan of Care (Signed)

## 2021-12-18 NOTE — Progress Notes (Signed)
PHARMACIST - PHYSICIAN COMMUNICATION  DR:   Louanne Belton  CONCERNING: IV to Oral Route Change Policy  RECOMMENDATION: This patient is receiving pantoprazole by the intravenous route.  Based on criteria approved by the Pharmacy and Therapeutics Committee, the intravenous medication(s) is/are being converted to the equivalent oral dose form(s).   DESCRIPTION: These criteria include: The patient is eating (either orally or via tube) and/or has been taking other orally administered medications for a least 24 hours The patient has no evidence of active gastrointestinal bleeding or impaired GI absorption (gastrectomy, short bowel, patient on TNA or NPO).  If you have questions about this conversion, please contact the Pharmacy Department  '[]'$   [phone] )  [facility] '[]'$   [phone] )  [facility] '[x]'$   [phone] )  [facility] '[]'$   [phone] )  [facility] '[]'$   [phone] )  [place], PharmD, BCPS 12/18/2021 9:05 AM

## 2021-12-18 NOTE — Progress Notes (Signed)
Pt is put on cpap with 2l bleed in. Vitals stable.

## 2021-12-18 NOTE — Progress Notes (Signed)
[facility] Progress Note  7 Days Post-Op  Subjective: CC-  Sitting up on edge of bed. Abdominal pain well controlled. Denies any n/v. Tolerating diet. BM last night. WBC slightly up 13 from 12, afebrile. Drain #1 appears purulent this morning, other two drains still serous.  Objective: Vital signs in last 24 hours: Temp:  [98.2 F (36.8 C)-98.9 F (37.2 C)] 98.2 F (36.8 C) (11/20 0432) Pulse Rate:  [88-98] 88 (11/20 0432) Resp:  [17-18] 18 (11/20 0432) BP: (160-176)/(64-86) 162/86 (11/20 0432) SpO2:  [94 %-95 %] 95 % (11/20 0432) Weight:  [143.3 kg] 143.3 kg (11/20 0432) Last BM Date : 12/17/21  Intake/Output from previous day: 11/19 0701 - 11/20 0700 In: 1571.8 [P.O.:1397; IV Piggyback:174.8] Out: 61 [Drains:61] Intake/Output this shift: No intake/output data recorded.  PE: Gen: Alert, NAD Abd: obese, soft, nontender, Incisions clean and dry with no erythema or induration. JP #1 purulent (39cc/24hr), #2 and #3 serous  Lab Results:  Recent Labs    12/16/21 0908 12/18/21 0325  WBC 12.5* 13.3*  HGB 12.7 12.6  HCT 39.1 39.2  PLT 282 385   BMET Recent Labs    12/16/21 0908 12/18/21 0325  NA 141 141  K 3.9 4.2  CL 101 107  CO2 27 19*  GLUCOSE 134* 134*  BUN 11 9  CREATININE 0.92 1.01*  CALCIUM 8.2* 8.2*   PT/INR No results for input(s): "LABPROT", "INR" in the last 72 hours. CMP     Component Value Date/Time   NA 141 12/18/2021 0325   K 4.2 12/18/2021 0325   CL 107 12/18/2021 0325   CO2 19 (L) 12/18/2021 0325   GLUCOSE 134 (H) 12/18/2021 0325   BUN 9 12/18/2021 0325   CREATININE 1.01 (H) 12/18/2021 0325   CALCIUM 8.2 (L) 12/18/2021 0325   PROT 6.0 (L) 12/11/2021 1625   ALBUMIN 2.0 (L) 12/11/2021 1625   AST 33 12/11/2021 1625   ALT 21 12/11/2021 1625   ALKPHOS 109 12/11/2021 1625   BILITOT 0.5 12/11/2021 1625   GFRNONAA >60 12/18/2021 0325   GFRAA >60 05/11/2019 1411   Lipase     Component Value Date/Time   LIPASE 21  12/11/2021 1625       Studies/Results: No results found.  Anti-infectives: Anti-infectives (From admission, onward)    Start     Dose/Rate Route Frequency Ordered Stop   12/12/21 0600  ceFEPIme (MAXIPIME) 2 g in sodium chloride 0.9 % 100 mL IVPB  Status:  Discontinued        2 g 200 mL/hr over 30 Minutes Intravenous Every 12 hours 12/11/21 2129 12/11/21 2323   12/12/21 0600  piperacillin-tazobactam (ZOSYN) IVPB 3.375 g        3.375 g 12.5 mL/hr over 240 Minutes Intravenous Every 8 hours 12/11/21 2326     12/11/21 2330  piperacillin-tazobactam (ZOSYN) IVPB 3.375 g        3.375 g 100 mL/hr over 30 Minutes Intravenous To Post Anesthesia Care Unit 12/11/21 2326 12/12/21 0041   12/11/21 1630  metroNIDAZOLE (FLAGYL) IVPB 500 mg        500 mg 100 mL/hr over 60 Minutes Intravenous  Once 12/11/21 1619 12/11/21 2049   12/11/21 1630  ceFEPIme (MAXIPIME) 2 g in sodium chloride 0.9 % 100 mL IVPB        2 g 200 mL/hr over 30 Minutes Intravenous  Once 12/11/21 1623 12/11/21 1817        Assessment/Plan POD 7, s/p diagnostic lap washout, evacuation of  abscess, and JP drain placement x2, Dr. Thermon Leyland 11/13 -Tolerating soft diet and having bowel function -pulm toilet, needs to mobilize more. PT to see again today -With slightly worsening leukocytosis and change in drain #1 to purulence, will obtain CT scan today   FEN - soft diet VTE - heparin ID - Antibiotics for treatment of GNR bacteremia. This was possibly a transient bacteremia 2/2 perf diverticulitis   HTN Morbid obesity OSA CHF Bipolar disorder HLD Seizures    LOS: 7 days    [place]ellington [place], [facility] 12/18/2021, 10:31 AM Please see Amion for pager number during day hours 7:00am-4:30pm

## 2021-12-18 NOTE — Progress Notes (Signed)
PROGRESS NOTE    [name]  OEV:[id number] DOB: [birth year] DOA: 12/11/2021 PCP: [place], Betty G, MD    Brief Narrative:  [name] is a 51 years old female with past medical history obstructive sleep apnea/OHS on CPAP presented to our hospital on 12/11/2021 with abdominal pain.   CT scan of the abdomen pelvis showed free gas and fluid.  Patient was seen by general surgery and was taken emergently to the OR.  There was finding of well-formed abscess cavity in the lower mid abdomen near the sigmoid colon with purulent peritonitis and fibrinous exudate throughout the abdomen.  Findings was suspected secondary to perforated sigmoid diverticulitis.  She had laparoscopic peritoneal lavage with drains placed.  Patient was intubated postoperatively and was initially in the ICU and was subsequently was transferred to our service after successful extubation.   During hospitalization, general surgery has followed the patient.  Patient still has 3 peritoneal drains and has been advanced on diet.  General surgery has noted some purulence in one of the drains CT scan of the abdomen has been planned.   Assessment and Plan:  Principal Problem:   Pneumoperitoneum Active Problems:   Morbid obesity ([place])   Hyperlipidemia   Type 2 diabetes mellitus with other specified complication (HCC)   OSA on CPAP   Essential hypertension, benign   Bipolar I disorder (HCC)   Anxiety state   Chronic combined systolic and diastolic CHF (congestive heart failure) (HCC)   Colitis   Lactic acidosis   Perforation of sigmoid colon due to diverticulitis   Presumed perforated sigmoid diverticulitis with peritonitis and abscess - s/p laparascopic peritoneal lavage with drain placement. Has been advanced to soft diet.  Continue IV Zosyn.  Blood cultures showing gram-negative rods with bacteroids.  Abdominal fluid with with  E. coli.  Blood culture with gram-negative rods.  Currently on IV Zosyn.  Clinically  improving but one of the drains with purulence.  General surgery recommending CT scan of the abdomen and pelvis for reassessment  Rising leukocytosis with purulence in the drain.  General surgery has recommended CT scan of the abdomen pelvis.  No fever temperature max of 98.9 F.  Hx OSA/OHS/Acute Hypoxic Respiratory Failure CPAP at home.   Hypokalemia. Replenished and improved.  Latest potassium of 4.2.  Acute kidney injury. Improved after IV fluid..  Creatinine has normalized at this time.  Latest creatinine of 1.0.  Off IV fluids at this time.  Lab Results  Component Value Date   CREATININE 1.01 (H) 12/18/2021   CREATININE 0.92 12/16/2021   CREATININE 1.03 (H) 12/15/2021    Hx HFpEF/hypertension, hyperlipidemia 20 Echo from Feb 2022 with EF 60-65%, G1DD), Lasix on hold.  Off IV fluids.  Hx prediabetes. Continue sliding scale insulin.  Controlled.   Hx Seizures, Bipolar disorder, depression. Patient is on alprazolam, Adderall, Clonazepam, Lyrica, Vraylar at home.  Alprazolam Lyrica and vraylar has been restarted.  Morbid obesity. Body mass index is 52.57 kg/m.  Would benefit from weight loss as outpatient.   DVT prophylaxis: heparin injection 5,000 Units Start: 12/12/21 0600   Code Status:     Code Status: Full code  Disposition:  Likely home with home health as per PT recommendation in 1 to 2 days, if okay with surgery.  Status is: Inpatient  Remains inpatient appropriate because: Status post abdominal surgery, IV antibiotic, CT scan of the abdomen today.   Family Communication:  Spoke with the patient's relative.  Ms. Delsa Sale at bedside on  12/16/2021.  Consultants:  General surgery. PCCM  Procedures:  Laparoscopic peritoneal lavage with drain placement on 12/11/2021. Intubation and subsequent extubation. NG tube placement and self removal  Antimicrobials:  Zosyn IV 11/13>  Subjective: Today, patient was seen and examined at bedside.  Denies any  nausea vomiting fever chills or rigor.  Has had bowel movement in the last night.  Objective: Vitals:   12/17/21 2102 12/17/21 2142 12/18/21 0432 12/18/21 1100  BP: (!) 160/64  (!) 162/86 (!) 145/82  Pulse: 98 94 88 95  Resp: '17 18 18   '$ Temp: 98.9 F (37.2 C)  98.2 F (36.8 C) 98.7 F (37.1 C)  TempSrc: Oral  Oral Oral  SpO2: 94%  95% 93%  Weight:   (!) 143.3 kg   Height:        Intake/Output Summary (Last 24 hours) at 12/18/2021 1158 Last data filed at 12/18/2021 1100 Gross per 24 hour  Intake 1509.74 ml  Output 61 ml  Net 1448.74 ml    Filed Weights   12/11/21 2114 12/18/21 0432  Weight: (!) 138.6 kg (!) 143.3 kg    Physical Examination: Body mass index is 52.57 kg/m.   General: Morbidly obese, alert awake and Communicative, not in obvious distress.  HENT:   No scleral pallor or icterus noted. Oral mucosa is moist.  Chest.  Diminished breath sounds bilaterally. No crackles or wheezes.  CVS: S1 &S2 heard. No murmur.  Regular rate and rhythm. Abdomen: Soft, tender bowel sounds are present.  JP drains in place.  Extremities: No cyanosis, clubbing or edema.  Peripheral pulses are palpable. Psych: Alert, awake and oriented, normal mood CNS:  No cranial nerve deficits.  Power equal in all extremities.   Skin: Warm and dry.  Abdominal wall with JP drains  Data Reviewed:   CBC: Recent Labs  Lab 12/13/21 0753 12/14/21 0602 12/15/21 0740 12/16/21 0908 12/18/21 0325  WBC 18.1* 12.8* 12.1* 12.5* 13.3*  HGB 12.1 12.6 12.0 12.7 12.6  HCT 34.5* 37.7 34.0* 39.1 39.2  MCV 88.7 89.8 88.1 93.5 94.7  PLT 292 288 245 282 385     Basic Metabolic Panel: Recent Labs  Lab 12/12/21 0145 12/12/21 1215 12/13/21 0753 12/14/21 0602 12/15/21 0740 12/16/21 0908 12/18/21 0325  NA 140   < > 142 139 138 141 141  K 4.1   < > 4.2 3.7 3.9 3.9 4.2  CL 100   < > 102 102 100 101 107  CO2 24   < > '27 28 26 27 '$ 19*  GLUCOSE 151*   < > 142* 124* 115* 134* 134*  BUN 15   < > 20 22*  '16 11 9  '$ CREATININE 1.96*   < > 1.40* 1.36* 1.03* 0.92 1.01*  CALCIUM 9.1   < > 8.3* 8.4* 8.2* 8.2* 8.2*  MG 1.7  --  2.6*  --  2.2 2.1 2.1  PHOS 4.6  --   --   --   --   --   --    < > = values in this interval not displayed.     Liver Function Tests: Recent Labs  Lab 12/11/21 1625  AST 33  ALT 21  ALKPHOS 109  BILITOT 0.5  PROT 6.0*  ALBUMIN 2.0*      Radiology Studies: No results found.    LOS: 7 days    Flora Lipps, MD [facility] Available via Epic secure chat 7am-7pm After these hours, please refer to coverage provider listed on [email or web address]  12/18/2021, 11:58 AM

## 2021-12-18 NOTE — Progress Notes (Addendum)
Pharmacy Antibiotic Note  TYKERA SKATES is a 51 y.o. female admitted on 12/11/2021 with intra-abdominal abscess s/p peritoneal lavage with drain placement. Now with bacteremia which is likely transient 2/2 perforated diverticulitis. Pharmacy has been consulted for Zosyn dosing.  WBC 12.5 > 13.3, afebrile  Plan: Continue Zosyn 3.375g IV q8h (4 hour infusion). Monitor clinical status, renal function, further culture data F/u antibiotic length of therapy   Height: '5\' 5"'$  (165.1 cm) Weight: (!) 143.3 kg (315 lb 14.7 oz) IBW/kg (Calculated) : 57  Temp (24hrs), Avg:98.4 F (36.9 C), Min:98 F (36.7 C), Max:98.9 F (37.2 C)  Recent Labs  Lab 12/11/21 1617 12/11/21 1625 12/12/21 0145 12/12/21 1215 12/13/21 0753 12/14/21 0602 12/15/21 0740 12/16/21 0908 12/18/21 0325  WBC  --    < > 13.6*   < > 18.1* 12.8* 12.1* 12.5* 13.3*  CREATININE  --    < > 1.96*   < > 1.40* 1.36* 1.03* 0.92 1.01*  LATICACIDVEN 6.1*  --  2.5*  --   --   --   --   --   --    < > = values in this interval not displayed.     Estimated Creatinine Clearance: 94.1 mL/min (A) (by C-G formula based on SCr of 1.01 mg/dL (H)).    Allergies  Allergen Reactions   Savella [Milnacipran Hcl] Other (See Comments)    mania   Antimicrobials this admission:  Cefepime/Flagyl x1 11/13 Zosyn 11/14 >>   Dose adjustments this admission:   Microbiology results:  11/13 blood: 2/4 bottles Bacteroides thetaiotaomicron, beta-lactamase pos, no BCID run 11/13 peritoneal fluid: E coli (R-amp only), abundant Bacteroides thetaiotaomicron - final 11/13 fungus peritoneal fluid: none observed, final pending 11/13 MRSA PCR: neg   Thank you for allowing pharmacy to be a part of this patient's care.  Dimple Nanas, PharmD, BCPS 12/18/2021 8:51 AM

## 2021-12-18 NOTE — Progress Notes (Signed)
Mobility Specialist Progress Note:   12/18/21 1647  Mobility  Activity Refused mobility   Pt refused mobility d/t fatigue. Will f/u as able.    [name] Mobility Specialist Please contact via SecureChat or  Rehab office at [phone]

## 2021-12-19 ENCOUNTER — Inpatient Hospital Stay (HOSPITAL_COMMUNITY): Payer: Medicare PPO

## 2021-12-19 ENCOUNTER — Ambulatory Visit: Payer: Medicare PPO | Admitting: Psychology

## 2021-12-19 DIAGNOSIS — F319 Bipolar disorder, unspecified: Secondary | ICD-10-CM | POA: Diagnosis not present

## 2021-12-19 DIAGNOSIS — F411 Generalized anxiety disorder: Secondary | ICD-10-CM | POA: Diagnosis not present

## 2021-12-19 DIAGNOSIS — I5042 Chronic combined systolic (congestive) and diastolic (congestive) heart failure: Secondary | ICD-10-CM | POA: Diagnosis not present

## 2021-12-19 DIAGNOSIS — K668 Other specified disorders of peritoneum: Secondary | ICD-10-CM | POA: Diagnosis not present

## 2021-12-19 LAB — BASIC METABOLIC PANEL
Anion gap: 13 (ref 5–15)
BUN: 7 mg/dL (ref 6–20)
CO2: 26 mmol/L (ref 22–32)
Calcium: 8.7 mg/dL — ABNORMAL LOW (ref 8.9–10.3)
Chloride: 103 mmol/L (ref 98–111)
Creatinine, Ser: 0.77 mg/dL (ref 0.44–1.00)
GFR, Estimated: >60 mL/min (ref >60)
Glucose, Bld: 153 mg/dL — ABNORMAL HIGH (ref 70–99)
Potassium: 4.4 mmol/L (ref 3.5–5.1)
Sodium: 142 mmol/L (ref 135–145)

## 2021-12-19 LAB — CBC
HCT: 37.7 % (ref 36.0–46.0)
Hemoglobin: 12.3 g/dL (ref 12.0–15.0)
MCH: 30.6 pg (ref 26.0–34.0)
MCHC: 32.6 g/dL (ref 30.0–36.0)
MCV: 93.8 fL (ref 80.0–100.0)
Platelets: 364 10*3/uL (ref 150–400)
RBC: 4.02 MIL/uL (ref 3.87–5.11)
RDW: 14.6 % (ref 11.5–15.5)
WBC: 12.9 10*3/uL — ABNORMAL HIGH (ref 4.0–10.5)
nRBC: 0 % (ref 0.0–0.2)

## 2021-12-19 LAB — GLUCOSE, CAPILLARY
Glucose-Capillary: 159 mg/dL — ABNORMAL HIGH (ref 70–99)
Glucose-Capillary: 156 mg/dL — ABNORMAL HIGH (ref 70–99)
Glucose-Capillary: 113 mg/dL — ABNORMAL HIGH (ref 70–99)
Glucose-Capillary: 130 mg/dL — ABNORMAL HIGH (ref 70–99)
Glucose-Capillary: 120 mg/dL — ABNORMAL HIGH (ref 70–99)
Glucose-Capillary: 129 mg/dL — ABNORMAL HIGH (ref 70–99)

## 2021-12-19 MED ORDER — LIDOCAINE HCL 1 % IJ SOLN
20.0000 mL | Freq: Once | INTRAMUSCULAR | Status: DC
  Filled 2021-12-19: qty 20

## 2021-12-19 MED ORDER — POLYETHYLENE GLYCOL 3350 17 G PO PACK
17.0000 g | PACK | Freq: Every day | ORAL | Status: DC
  Administered 2021-12-19 – 2021-12-23 (×3): 17 g via ORAL
  Filled 2021-12-19 (×5): qty 1

## 2021-12-19 MED ORDER — FENTANYL CITRATE (PF) 100 MCG/2ML IJ SOLN
INTRAMUSCULAR | Status: AC | PRN
  Administered 2021-12-19 (×3): 50 ug via INTRAVENOUS
  Administered 2021-12-19: 25 ug via INTRAVENOUS

## 2021-12-19 MED ORDER — MIDAZOLAM HCL 2 MG/2ML IJ SOLN
INTRAMUSCULAR | Status: AC
  Filled 2021-12-19: qty 2

## 2021-12-19 MED ORDER — FENTANYL CITRATE (PF) 100 MCG/2ML IJ SOLN
INTRAMUSCULAR | Status: AC | PRN
  Administered 2021-12-19: 25 ug via INTRAVENOUS

## 2021-12-19 MED ORDER — ONDANSETRON HCL 4 MG/2ML IJ SOLN
INTRAMUSCULAR | Status: AC
  Filled 2021-12-19: qty 2

## 2021-12-19 MED ORDER — MIDAZOLAM HCL 2 MG/2ML IJ SOLN
INTRAMUSCULAR | Status: AC | PRN
  Administered 2021-12-19: 1 mg via INTRAVENOUS
  Administered 2021-12-19 (×2): .5 mg via INTRAVENOUS

## 2021-12-19 MED ORDER — MORPHINE SULFATE (PF) 2 MG/ML IV SOLN
1.0000 mg | INTRAVENOUS | Status: DC | PRN
  Administered 2021-12-20: 2 mg via INTRAVENOUS
  Filled 2021-12-19 (×2): qty 1

## 2021-12-19 MED ORDER — SODIUM CHLORIDE 0.9% FLUSH
5.0000 mL | Freq: Three times a day (TID) | INTRAVENOUS | Status: DC
  Administered 2021-12-19 – 2022-01-05 (×44): 5 mL

## 2021-12-19 MED ORDER — FENTANYL CITRATE (PF) 100 MCG/2ML IJ SOLN
INTRAMUSCULAR | Status: AC
  Filled 2021-12-19: qty 4

## 2021-12-19 MED ORDER — DOCUSATE SODIUM 100 MG PO CAPS
100.0000 mg | ORAL_CAPSULE | Freq: Two times a day (BID) | ORAL | Status: DC
  Administered 2021-12-19 – 2021-12-23 (×9): 100 mg via ORAL
  Filled 2021-12-19 (×9): qty 1

## 2021-12-19 MED ORDER — ONDANSETRON HCL 4 MG/2ML IJ SOLN
INTRAMUSCULAR | Status: AC | PRN
  Administered 2021-12-19: 4 mg via INTRAVENOUS

## 2021-12-19 NOTE — Progress Notes (Signed)
[facility] Progress Note  8 Days Post-Op  Subjective: CC-  Abdomen still sore but no severe pain. Tolerating diet. No n/v. Has not had a BM in a few days but feels like she will today. Passing flatus.  WBC stable at 12.9, afebrile. CT yesterday showed multiple large intraabdominal fluid collections. Drain #1 still appears purulent today.  Objective: Vital signs in last 24 hours: Temp:  [97.7 F (36.5 C)-98.7 F (37.1 C)] 97.7 F (36.5 C) (11/21 0526) Pulse Rate:  [46-95] 86 (11/21 0526) Resp:  [16-18] 18 (11/21 0526) BP: (130-197)/(66-87) 130/66 (11/21 0526) SpO2:  [83 %-95 %] 92 % (11/21 0526) FiO2 (%):  [28 %] 28 % (11/20 2245) Last BM Date : 12/17/21  Intake/Output from previous day: 11/20 0701 - 11/21 0700 In: 997.9 [P.O.:840; IV Piggyback:157.9] Out: 35 [Drains:35] Intake/Output this shift: No intake/output data recorded.  PE: Gen: Alert, NAD Abd: obese, soft, nontender, Incisions clean and dry with no erythema or induration. JP #1 purulent (15cc/24hr), #2 (5cc) and #3 (15cc) serous   Lab Results:  Recent Labs    12/18/21 0325 12/19/21 0511  WBC 13.3* 12.9*  HGB 12.6 12.3  HCT 39.2 37.7  PLT 385 364   BMET Recent Labs    12/18/21 0325 12/19/21 0511  NA 141 142  K 4.2 4.4  CL 107 103  CO2 19* 26  GLUCOSE 134* 153*  BUN 9 7  CREATININE 1.01* 0.77  CALCIUM 8.2* 8.7*   PT/INR No results for input(s): "LABPROT", "INR" in the last 72 hours. CMP     Component Value Date/Time   NA 142 12/19/2021 0511   K 4.4 12/19/2021 0511   CL 103 12/19/2021 0511   CO2 26 12/19/2021 0511   GLUCOSE 153 (H) 12/19/2021 0511   BUN 7 12/19/2021 0511   CREATININE 0.77 12/19/2021 0511   CALCIUM 8.7 (L) 12/19/2021 0511   PROT 6.0 (L) 12/11/2021 1625   ALBUMIN 2.0 (L) 12/11/2021 1625   AST 33 12/11/2021 1625   ALT 21 12/11/2021 1625   ALKPHOS 109 12/11/2021 1625   BILITOT 0.5 12/11/2021 1625   GFRNONAA >60 12/19/2021 0511   GFRAA >60 05/11/2019 1411    Lipase     Component Value Date/Time   LIPASE 21 12/11/2021 1625       Studies/Results: CT ABDOMEN PELVIS W CONTRAST  Result Date: 12/18/2021 CLINICAL DATA:  Postoperative abdominal pain, status post EXAM: CT ABDOMEN AND PELVIS WITH CONTRAST TECHNIQUE: Multidetector CT imaging of the abdomen and pelvis was performed using the standard protocol following bolus administration of intravenous contrast. RADIATION DOSE REDUCTION: This exam was performed according to the departmental dose-optimization program which includes automated exposure control, adjustment of the mA and/or kV according to patient size and/or use of iterative reconstruction technique. CONTRAST:  133m OMNIPAQUE IOHEXOL 350 MG/ML SOLN COMPARISON:  CTA chest abdomen pelvis, 12/11/2021 FINDINGS: Lower chest: Bandlike dependent bibasilar atelectasis. Hepatobiliary: No solid liver abnormality is seen. Tiny gallstone. No gallbladder wall thickening, or biliary dilatation. Pancreas: Unremarkable. No pancreatic ductal dilatation or surrounding inflammatory changes. Spleen: Normal in size without significant abnormality. Adrenals/Urinary Tract: Adrenal glands are unremarkable. Kidneys are normal, without renal calculi, solid lesion, or hydronephrosis. Bladder is unremarkable. Stomach/Bowel: Stomach is within normal limits. Appendix appears normal. No evidence of bowel wall thickening, distention, or inflammatory changes. Sigmoid diverticulosis Vascular/Lymphatic: Aortic atherosclerosis. No enlarged abdominal or pelvic lymph nodes. Reproductive: No mass or other significant abnormality. Other: No abdominal wall hernia or abnormality. Small volume perihepatic ascites.  Multiple loculated appearing air and fluid collections are again seen in the low abdomen and pelvis, most notably in the right lower quadrant measuring 9.4 x 8.8 cm (series 15, image 80), low abdomen anterior to the uterus and adjacent to the sigmoid colon measuring 9.0 x 5.1 cm  (series 15, image 79), in the pelvis interposed between the rectum and uterus measuring 6.2 x 5.0 cm (series 15, image 82), and in the left hemiabdomen adjacent to mid small bowel measuring 6.2 x 3.4 cm (series 15, image 63). Surgical drains are looped about the abdomen and pelvis, with tips positioned in the central low abdomen (series 15, image 86) and in the left upper quadrant (series 15, image 61). Musculoskeletal: No acute or significant osseous findings. IMPRESSION: 1. Multiple loculated air and fluid collections are again seen throughout the abdomen and pelvis, most consistent with multifocal intra-abdominal abscesses in the setting of perforated viscus. However note that the presence or absence of infection is not established by CT. 2. Surgical drains are looped about the abdomen and pelvis, with tips positioned in the central low abdomen and in the left upper quadrant. 3. Small volume perihepatic ascites. 4. Cholelithiasis. Aortic Atherosclerosis (ICD10-I70.0). Electronically Signed   By: Delanna Ahmadi M.D.   On: 12/18/2021 16:55    Anti-infectives: Anti-infectives (From admission, onward)    Start     Dose/Rate Route Frequency Ordered Stop   12/12/21 0600  ceFEPIme (MAXIPIME) 2 g in sodium chloride 0.9 % 100 mL IVPB  Status:  Discontinued        2 g 200 mL/hr over 30 Minutes Intravenous Every 12 hours 12/11/21 2129 12/11/21 2323   12/12/21 0600  piperacillin-tazobactam (ZOSYN) IVPB 3.375 g        3.375 g 12.5 mL/hr over 240 Minutes Intravenous Every 8 hours 12/11/21 2326     12/11/21 2330  piperacillin-tazobactam (ZOSYN) IVPB 3.375 g        3.375 g 100 mL/hr over 30 Minutes Intravenous To Post Anesthesia Care Unit 12/11/21 2326 12/12/21 0041   12/11/21 1630  metroNIDAZOLE (FLAGYL) IVPB 500 mg        500 mg 100 mL/hr over 60 Minutes Intravenous  Once 12/11/21 1619 12/11/21 2049   12/11/21 1630  ceFEPIme (MAXIPIME) 2 g in sodium chloride 0.9 % 100 mL IVPB        2 g 200 mL/hr over 30  Minutes Intravenous  Once 12/11/21 1623 12/11/21 1817        Assessment/Plan POD 8, s/p diagnostic lap washout, evacuation of abscess, and JP drain placement x2, Dr. Thermon Leyland 11/13 -CT 11/20 showed multiple loculated air and fluid collections throughout the abdomen and pelvis. JP drain #1 still purulent today, #2 and #3 serous. Will ask IR to evaluate for possible drain placement. Continue antibiotics for now. -mobilize, PT - recommending home health PT   FEN - NPO for possible procedure, ok for regular diet after procedure VTE - sq heparin ID - currently zosyn   GNR bacteremia  HTN Morbid obesity OSA CHF Bipolar disorder HLD Seizures    LOS: 8 days    Wellington Hampshire, [facility] 12/19/2021, 10:13 AM Please see Amion for pager number during day hours 7:00am-4:30pm

## 2021-12-19 NOTE — Progress Notes (Signed)
PROGRESS NOTE    [name]  NIO:[id number] DOB: 18-Jan-1971 DOA: 12/11/2021 PCP: [place], Betty G, MD    Brief Narrative:  [name] is a 51 years old female with past medical history obstructive sleep apnea/OHS on CPAP presented to our hospital on 12/11/2021 with abdominal pain.   CT scan of the abdomen pelvis showed free gas and fluid.  Patient was seen by general surgery and was taken emergently to the OR.  There was finding of well-formed abscess cavity in the lower mid abdomen near the sigmoid colon with purulent peritonitis and fibrinous exudate throughout the abdomen.  Findings was suspected secondary to perforated sigmoid diverticulitis.  She had laparoscopic peritoneal lavage with drains placed.  Patient was intubated postoperatively and was initially in the ICU and was subsequently was transferred to our service after successful extubation.   During hospitalization, general surgery has followed the patient.  Patient still has 3 peritoneal drains and has been advanced on diet.  General surgery has noted some purulence in one of the drains CT scan of the abdomen was performed on 12/18/2021 with findings of multiple fluid and gas collections.  General surgery has consulted IR on 12/19/2021 for repeat drain placement through IR guidance.   Assessment and Plan:  Principal Problem:   Pneumoperitoneum Active Problems:   Morbid obesity ([place])   Hyperlipidemia   Type 2 diabetes mellitus with other specified complication (HCC)   OSA on CPAP   Essential hypertension, benign   Bipolar I disorder (HCC)   Anxiety state   Chronic combined systolic and diastolic CHF (congestive heart failure) (HCC)   Colitis   Lactic acidosis   Perforation of sigmoid colon due to diverticulitis   Presumed perforated sigmoid diverticulitis with peritonitis and abscess - s/p laparascopic peritoneal lavage with drain placement. On IV Zosyn.  Blood cultures showing gram-negative rods with  bacteroids.  Abdominal fluid with with  E. coli.  Blood culture with gram-negative rods.    Clinically improving but one of the drains with purulence so general surgery recommended CT scan of the abdomen and pelvis for reassessment 08/17/2021 with a multiple air fluid  levels.  IR has been consulted at this time for drain placement under IR guidance.   leukocytosis with purulence in the drain.  Afebrile.  Leukocytosis at 12.9.  Hx OSA/OHS/Acute Hypoxic Respiratory Failure CPAP at home.   Hypokalemia. Replenished and improved.  Latest potassium of 4.4.  Acute kidney injury. Improved after IV fluids.  Latest creatinine of 0.7.  Lab Results  Component Value Date   CREATININE 0.77 12/19/2021   CREATININE 1.01 (H) 12/18/2021   CREATININE 0.92 12/16/2021    Hx HFpEF/hypertension, hyperlipidemia 20 Echo from Feb 2022 with EF 60-65%, G1DD), Lasix on hold.  Off IV fluids.  Hx prediabetes. Continue sliding scale insulin.  Controlled.   Hx Seizures, Bipolar disorder, depression. Patient is on alprazolam, Adderall, Clonazepam, Lyrica, Vraylar at home.  Alprazolam Lyrica and vraylar has been restarted.  Morbid obesity. Body mass index is 52.57 kg/m.  Would benefit from weight loss as outpatient.   DVT prophylaxis: heparin injection 5,000 Units Start: 12/12/21 0600   Code Status:     Code Status: Full code  Disposition:  Likely home with home health as per PT recommendation when okay with surgery.  Status is: Inpatient  Remains inpatient appropriate because: Status post abdominal surgery, IV antibiotic, need for further IR guided intervention.   Family Communication:  Spoke with the patient's relative.  Ms. Delsa Sale  at bedside on 12/16/2021.  Consultants:  General surgery. PCCM Interventional radiology  Procedures:  Laparoscopic peritoneal lavage with drain placement on 12/11/2021. Intubation and subsequent extubation. NG tube placement and self removal  Antimicrobials:   Zosyn IV 11/13>  Subjective: Today, patient was seen and examined at bedside.  Patient denies any nausea vomiting fever chills or rigor.  Denies overt pain.  No shortness of breath cough.  Objective: Vitals:   12/18/21 2011 12/18/21 2245 12/19/21 0526 12/19/21 1057  BP: (!) 179/87  130/66 128/81  Pulse: (!) 46 88 86 84  Resp: '18 16 18 18  '$ Temp: 98.3 F (36.8 C)  97.7 F (36.5 C) 98.1 F (36.7 C)  TempSrc: Oral  Oral Oral  SpO2: (!) 83% 95% 92% 96%  Weight:      Height:        Intake/Output Summary (Last 24 hours) at 12/19/2021 1249 Last data filed at 12/19/2021 0200 Gross per 24 hour  Intake 700.04 ml  Output 35 ml  Net 665.04 ml    Filed Weights   12/11/21 2114 12/18/21 0432  Weight: (!) 138.6 kg (!) 143.3 kg    Physical Examination: Body mass index is 52.57 kg/m.   General: Morbidly obese, not in obvious distress, alert awake and Communicative HENT:   No scleral pallor or icterus noted. Oral mucosa is moist.  Chest:   Diminished breath sounds bilaterally. No crackles or wheezes.  CVS: S1 &S2 heard. No murmur.  Regular rate and rhythm. Abdomen: Soft, nontender, nondistended.  Bowel sounds are heard.  JP drains in place. Extremities: No cyanosis, clubbing or edema.  Peripheral pulses are palpable. Psych: Alert, awake and oriented, normal mood CNS:  No cranial nerve deficits.  Power equal in all extremities.   Skin: Warm and dry.  Abdominal wall with JP drains.   Data Reviewed:   CBC: Recent Labs  Lab 12/14/21 0602 12/15/21 0740 12/16/21 0908 12/18/21 0325 12/19/21 0511  WBC 12.8* 12.1* 12.5* 13.3* 12.9*  HGB 12.6 12.0 12.7 12.6 12.3  HCT 37.7 34.0* 39.1 39.2 37.7  MCV 89.8 88.1 93.5 94.7 93.8  PLT 288 245 282 385 364     Basic Metabolic Panel: Recent Labs  Lab 12/13/21 0753 12/14/21 0602 12/15/21 0740 12/16/21 0908 12/18/21 0325 12/19/21 0511  NA 142 139 138 141 141 142  K 4.2 3.7 3.9 3.9 4.2 4.4  CL 102 102 100 101 107 103  CO2 '27 28  26 27 '$ 19* 26  GLUCOSE 142* 124* 115* 134* 134* 153*  BUN 20 22* '16 11 9 7  '$ CREATININE 1.40* 1.36* 1.03* 0.92 1.01* 0.77  CALCIUM 8.3* 8.4* 8.2* 8.2* 8.2* 8.7*  MG 2.6*  --  2.2 2.1 2.1  --      Liver Function Tests: No results for input(s): "AST", "ALT", "ALKPHOS", "BILITOT", "PROT", "ALBUMIN" in the last 168 hours.    Radiology Studies: CT ABDOMEN PELVIS W CONTRAST  Result Date: 12/18/2021 CLINICAL DATA:  Postoperative abdominal pain, status post EXAM: CT ABDOMEN AND PELVIS WITH CONTRAST TECHNIQUE: Multidetector CT imaging of the abdomen and pelvis was performed using the standard protocol following bolus administration of intravenous contrast. RADIATION DOSE REDUCTION: This exam was performed according to the departmental dose-optimization program which includes automated exposure control, adjustment of the mA and/or kV according to patient size and/or use of iterative reconstruction technique. CONTRAST:  124m OMNIPAQUE IOHEXOL 350 MG/ML SOLN COMPARISON:  CTA chest abdomen pelvis, 12/11/2021 FINDINGS: Lower chest: Bandlike dependent bibasilar atelectasis. Hepatobiliary: No solid liver  abnormality is seen. Tiny gallstone. No gallbladder wall thickening, or biliary dilatation. Pancreas: Unremarkable. No pancreatic ductal dilatation or surrounding inflammatory changes. Spleen: Normal in size without significant abnormality. Adrenals/Urinary Tract: Adrenal glands are unremarkable. Kidneys are normal, without renal calculi, solid lesion, or hydronephrosis. Bladder is unremarkable. Stomach/Bowel: Stomach is within normal limits. Appendix appears normal. No evidence of bowel wall thickening, distention, or inflammatory changes. Sigmoid diverticulosis Vascular/Lymphatic: Aortic atherosclerosis. No enlarged abdominal or pelvic lymph nodes. Reproductive: No mass or other significant abnormality. Other: No abdominal wall hernia or abnormality. Small volume perihepatic ascites. Multiple loculated appearing  air and fluid collections are again seen in the low abdomen and pelvis, most notably in the right lower quadrant measuring 9.4 x 8.8 cm (series 15, image 80), low abdomen anterior to the uterus and adjacent to the sigmoid colon measuring 9.0 x 5.1 cm (series 15, image 79), in the pelvis interposed between the rectum and uterus measuring 6.2 x 5.0 cm (series 15, image 82), and in the left hemiabdomen adjacent to mid small bowel measuring 6.2 x 3.4 cm (series 15, image 63). Surgical drains are looped about the abdomen and pelvis, with tips positioned in the central low abdomen (series 15, image 86) and in the left upper quadrant (series 15, image 61). Musculoskeletal: No acute or significant osseous findings. IMPRESSION: 1. Multiple loculated air and fluid collections are again seen throughout the abdomen and pelvis, most consistent with multifocal intra-abdominal abscesses in the setting of perforated viscus. However note that the presence or absence of infection is not established by CT. 2. Surgical drains are looped about the abdomen and pelvis, with tips positioned in the central low abdomen and in the left upper quadrant. 3. Small volume perihepatic ascites. 4. Cholelithiasis. Aortic Atherosclerosis (ICD10-I70.0). Electronically Signed   By: Delanna Ahmadi M.D.   On: 12/18/2021 16:55      LOS: 8 days    Flora Lipps, MD [facility] Available via Epic secure chat 7am-7pm After these hours, please refer to coverage provider listed on [email or web address] 12/19/2021, 12:49 PM

## 2021-12-19 NOTE — Progress Notes (Signed)
Placed patient on CPAP for the night via auto-mode with oxygen set at 3lpm.   

## 2021-12-19 NOTE — Procedures (Signed)
Pre procedural Dx: Post op abdominal fluid collections Post procedural Dx: Same  Technically successful CT guided placed of a 12 Fr drainage catheter placement into the dominant collection within the right lower abdomen/pelvis. Technically successful CT guided placed of a 12 Fr drainage catheter placement into the dominant collection within the left lower abdomen/pelvis.  Technically successful CT guided placed of a 10 Fr drainage catheter placement into the collection within the right mid abdomen. Technically successful CT guided placed of a 10 Fr drainage catheter placement into the collection within the left mid abdomen.  A total of 300 cc of similar appearing feculent, foul smelling fluid was aspirated from all drainage catheter, with the most being aspirated from the lower two abdominal/pelvic drains, right greater than left.  A representative sample of aspirated fluid was capped and sent to the lab for analysis.   EBL: Trace Complications: None immediate  Ronny Bacon, MD Pager #: [phone]

## 2021-12-19 NOTE — Sedation Documentation (Signed)
Intra procedure drainage obtained from 4 drains total 300 ml, malodorous drainage in varying shades of brown. Specimen sent.

## 2021-12-19 NOTE — Consult Note (Signed)
Chief Complaint: Intra abdominal abscess post op. Request is for intra abdominal abscess drain placement  Referring Physician(s): Dr. Sherlon Handing  Supervising Physician: Sandi Mariscal  Patient Status: [facility] - In-pt  History of Present Illness: [name] is a 51 y.o. female inpatient. History of morbid obesity, HTN, HLD, Presented to the ED on 11.13.23 with severe abd pain. Found to have a pneumoperitoneum  s/p laparoscopic peritoneal lavage with surgical drain placement on 11.13.23.  CT abd pelvis shows multiple fluid and gas collections. Team is requesting abscess drain placement.  Past Medical History:  Diagnosis Date   Acute systolic congestive heart failure (HCC)    Agoraphobia    Anxiety    panic attacks   Arthritis    osteoarthritis bilal. knees   Asthma    no per PFT 7/13; reports does not have asthma   Bipolar disorder ([place])    Carpal tunnel syndrome of left wrist 05/2011   Being evaluated for MS   Cor pulmonale ([place])    Depression    History of pleural effusion    Hyperlipidemia    Hypertension    Hypoxemia    history of - no home O2   IBS (irritable bowel syndrome)    Morbid obesity (HCC)    Obesity hypoventilation syndrome (HCC)    Pneumonia    Pre-diabetes    Prediabetes    Seasonal allergies    current runny nose   Seizures (HCC)    febrile seizure x 1 as a child. Several times   Sleep apnea sleep study 07/02/2010   uses CPAP nightly   Spinal stenosis in cervical region    Urinary incontinence     Past Surgical History:  Procedure Laterality Date   APPLICATION OF INTRAOPERATIVE CT SCAN N/A 05/14/2019   Procedure: APPLICATION OF INTRAOPERATIVE CT SCAN;  Surgeon: Erline Levine, MD;  Location: [place];  Service: Neurosurgery;  Laterality: N/A;   CARPAL TUNNEL RELEASE  06/27/2011   Procedure: CARPAL TUNNEL RELEASE;  Surgeon: Wynonia Sours, MD;  Location: [place];  Service: Orthopedics;  Laterality: Left;   CARPAL TUNNEL RELEASE   12/19/2011   Procedure: CARPAL TUNNEL RELEASE;  Surgeon: Wynonia Sours, MD;  Location: [place];  Service: Orthopedics;  Laterality: Right;   INCISION AND DRAINAGE ABSCESS Left 08/27/2017   Procedure: INCISION AND DRAINAGE LEFT BUTTOCK  ABSCESS;  Surgeon: Excell Seltzer, MD;  Location: [facility];  Service: General;  Laterality: Left;   IRRIGATION AND DEBRIDEMENT BUTTOCKS Left 08/30/2017   Procedure: IRRIGATION AND DEBRIDEMENT RE-EXCISION OF LEFT SUBCUTANEOUS BUTTOCKS ABCESS;  Surgeon: Kieth Brightly Arta Bruce, MD;  Location: [facility];  Service: General;  Laterality: Left;   LAPAROSCOPY N/A 12/11/2021   Procedure: LAPAROSCOPIC PERITONEAL LAVAGE WITH DRAIN PLACEMENT;  Surgeon: Felicie Morn, MD;  Location: [place];  Service: General;  Laterality: N/A;   MASS EXCISION  08/07/2010   right index   POSTERIOR CERVICAL FUSION/FORAMINOTOMY  09/11/2011   Procedure: POSTERIOR CERVICAL FUSION/FORAMINOTOMY LEVEL 1;  Surgeon: Erline Levine, MD;  Location: [place] [facility];  Service: Neurosurgery;  Laterality: N/A;  Cervical Three-Four Posteior Cervical Fusion and Decompression.   POSTERIOR CERVICAL FUSION/FORAMINOTOMY N/A 05/14/2019   Procedure: Posterior cervical decompression/fusion Cervical four to Thoracic one with exploration/revision of Cervical threee-four fusion;  Surgeon: Erline Levine, MD;  Location: [place];  Service: Neurosurgery;  Laterality: N/A;   TRIGGER FINGER RELEASE  12/19/2011   Procedure: RELEASE TRIGGER FINGER/A-1 PULLEY;  Surgeon: Wynonia Sours, MD;  Location: [place];  Service: Orthopedics;  Laterality: Right;   TRIGGER FINGER RELEASE Left 12/31/2012   Procedure: RELEASE A-1 PULLEY LEFT THUMB;  Surgeon: Wynonia Sours, MD;  Location: [place];  Service: Orthopedics;  Laterality: Left;   WISDOM TOOTH EXTRACTION      Allergies: Savella [milnacipran hcl]  Medications: Prior to Admission medications   Medication Sig Start Date End Date Taking?  Authorizing Provider  acetaminophen (TYLENOL) 500 MG tablet Take 1,000 mg by mouth every 6 (six) hours as needed for moderate pain.    Yes [provider]  albuterol (VENTOLIN HFA) 108 (90 Base) MCG/ACT inhaler Inhale 2 puffs into the lungs every 6 (six) hours as needed for wheezing or shortness of breath. 10/06/20  Yes Rigoberto Noel, MD  ALPRAZolam Duanne Moron) 1 MG tablet Take 1 mg by mouth 3 (three) times daily.   Yes [provider]  amphetamine-dextroamphetamine (ADDERALL) 20 MG tablet Take 20 mg by mouth in the morning, at noon, in the evening, and at bedtime.    Yes [provider]  Ascorbic Acid (VITAMIN C) 500 MG tablet Take 1,000 mg by mouth daily.   Yes [provider]  aspirin 81 MG tablet Take 1 tablet (81 mg total) by mouth daily. 05/21/19  Yes Costella, Vista Mink, PA-C  atorvastatin (LIPITOR) 20 MG tablet TAKE 1 TABLET BY MOUTH EVERY DAY IN THE EVENING Patient taking differently: Take 20 mg by mouth daily. 03/15/21  Yes Minus Breeding, MD  b complex vitamins tablet Take 1 tablet by mouth daily.   Yes [provider]  Calcium Citrate (CITRACAL PO) Take 2 tablets by mouth daily.   Yes [provider]  Cholecalciferol (VITAMIN D3) 5000 UNITS TABS Take 10,000 Units by mouth in the morning and at bedtime.   Yes [provider]  Chromium Picolinate 500 MCG TABS Take 500 mcg by mouth daily.    Yes [provider]  diclofenac Sodium (VOLTAREN) 1 % GEL Apply 2 g topically 3 (three) times daily as needed (knee pain).   Yes [provider]  furosemide (LASIX) 40 MG tablet Take 2 tablets (80 mg total) by mouth 2 (two) times daily. Patient taking differently: Take 40-80 mg by mouth See admin instructions. Take 80 mg in the morning and 40 mg in the evening 12/05/21  Yes [name], Jeneen Rinks, MD  [name] 1.5/30 1.5-30 MG-MCG tablet Take 1 tablet by mouth at bedtime. 08/03/19  Yes [provider]  levalbuterol (XOPENEX HFA) 45  MCG/ACT inhaler Inhale 2 puffs into the lungs every 6 (six) hours as needed for wheezing. 10/14/20  Yes Rigoberto Noel, MD  lidocaine (LIDODERM) 5 % PLACE 1 PATCH ONTO THE SKIN DAILY AS NEEDED FOR KNEE PAIN. REMOVE AND DISCARD PATCH WITHIN 12 HOURS OR AS DIRECTED BY DOCTOR. Patient taking differently: Place 1 patch onto the skin as needed (knee pain). 05/22/21  Yes Martinique, Betty G, MD  losartan (COZAAR) 25 MG tablet Take 1 tablet (25 mg total) by mouth 2 (two) times daily. 12/05/21  Yes [name], Jeneen Rinks, MD  LYRICA 300 MG capsule Take 300 mg by mouth 2 (two) times daily.  03/26/11  Yes [provider]  MAGNESIUM PO Take 500 mg by mouth daily.    Yes [provider]  Multiple Vitamin (MULTIVITAMIN) tablet Take 1 tablet by mouth daily.   Yes [provider]  NUEDEXTA 20-10 MG CAPS Take 1 capsule by mouth every 12 (twelve) hours.  09/27/16  Yes [provider]  Omega-3 Fatty Acids (FISH OIL) 1200 MG CAPS Take 2,400 mg by mouth daily.    Yes [provider]  potassium chloride SA (KLOR-CON M20) 20 MEQ tablet Take 1 tablet (20 mEq total) by mouth daily. 04/14/21  Yes [name], Jeneen Rinks, MD  VRAYLAR 6 MG CAPS Take 6 mg by mouth every evening.  09/03/16  Yes [provider]  Zinc 50 MG CAPS Take 50 mg by mouth in the morning and at bedtime.   Yes [provider]  Blood Glucose Monitoring Suppl (ACCU-CHEK AVIVA PLUS) w/Device KIT As directed. 11/14/21   Martinique, Betty G, MD  methocarbamol (ROBAXIN-750) 750 MG tablet Take 1 tablet (750 mg total) by mouth 3 (three) times daily as needed for muscle spasms. Patient not taking: Reported on 12/11/2021 05/17/19   Traci Sermon, PA-C     Family History  Problem Relation Age of Onset   Asthma Mother    Allergic rhinitis Mother    Multiple sclerosis Mother    Asthma Maternal Grandmother    Breast cancer Neg Hx     Social History   Socioeconomic History   Marital status: Married    Spouse name: Not on  file   Number of children: Not on file   Years of education: Not on file   Highest education level: Bachelor's degree (e.g., BA, AB, BS)  Occupational History   Occupation: currently unemployed  Tobacco Use   Smoking status: Never   Smokeless tobacco: Never  Substance and Sexual Activity   Alcohol use: Yes    Comment: rare   Drug use: No   Sexual activity: Not on file  Other Topics Concern   Not on file  Social History Narrative   Lives in [place] Strain: Medium Risk (11/13/2021)   Overall Financial Resource Strain (CARDIA)    Difficulty of Paying Living Expenses: Somewhat hard  Food Insecurity: No Food Insecurity (12/13/2021)   Hunger Vital Sign    Worried About Running Out of Food in the Last Year: Never true    Ran Out of Food in the Last Year: Never true  Transportation Needs: No Transportation Needs (12/13/2021)   PRAPARE - Hydrologist (Medical): No    Lack of Transportation (Non-Medical): No  Physical Activity: Insufficiently Active (11/13/2021)   Exercise Vital Sign    Days of Exercise per Week: 1 day    Minutes of Exercise per Session: 10 min  Stress: Stress Concern Present (11/13/2021)   [place]    Feeling of Stress : Very much  Social Connections: Moderately Isolated (11/13/2021)   Social Connection and Isolation Panel [NHANES]    Frequency of Communication with Friends and Family: More than three times a week    Frequency of Social Gatherings with Friends and Family: More than three times a week    Attends Religious Services: 1 to 4 times per year    Active Member of [employer] or Organizations: No    Attends Archivist Meetings: Never    Marital Status: Widowed    Review of Systems: A 12 point ROS discussed and pertinent positives are indicated in the HPI above.  All other systems are  negative.  Review of Systems  Constitutional:  Negative for fatigue and fever.  HENT:  Negative for congestion.   Respiratory:  Negative  for cough and shortness of breath.   Gastrointestinal:  Negative for abdominal pain, diarrhea, nausea and vomiting.    Vital Signs: BP 130/66 (BP Location: Left Arm)   Pulse 86   Temp 97.7 F (36.5 C) (Oral)   Resp 18   Ht _0  (1.651 m)   Wt (!) 315 lb 14.7 oz (143.3 kg)   SpO2 92%   BMI 52.57 kg/m     Physical Exam Vitals and nursing note reviewed.  Constitutional:      Appearance: She is obese.  HENT:     Head: Normocephalic and atraumatic.  Eyes:     Conjunctiva/sclera: Conjunctivae normal.  Cardiovascular:     Rate and Rhythm: Normal rate and regular rhythm.  Pulmonary:     Effort: Pulmonary effort is normal.     Breath sounds: Normal breath sounds.  Musculoskeletal:        General: Normal range of motion.     Cervical back: Normal range of motion.  Skin:    General: Skin is warm.  Neurological:     Mental Status: She is alert and oriented to person, place, and time.     Imaging: CT ABDOMEN PELVIS W CONTRAST  Result Date: 12/18/2021 CLINICAL DATA:  Postoperative abdominal pain, status post EXAM: CT ABDOMEN AND PELVIS WITH CONTRAST TECHNIQUE: Multidetector CT imaging of the abdomen and pelvis was performed using the standard protocol following bolus administration of intravenous contrast. RADIATION DOSE REDUCTION: This exam was performed according to the departmental dose-optimization program which includes automated exposure control, adjustment of the mA and/or kV according to patient size and/or use of iterative reconstruction technique. CONTRAST:  151m OMNIPAQUE IOHEXOL 350 MG/ML SOLN COMPARISON:  CTA chest abdomen pelvis, 12/11/2021 FINDINGS: Lower chest: Bandlike dependent bibasilar atelectasis. Hepatobiliary: No solid liver abnormality is seen. Tiny gallstone. No gallbladder wall thickening, or biliary dilatation.  Pancreas: Unremarkable. No pancreatic ductal dilatation or surrounding inflammatory changes. Spleen: Normal in size without significant abnormality. Adrenals/Urinary Tract: Adrenal glands are unremarkable. Kidneys are normal, without renal calculi, solid lesion, or hydronephrosis. Bladder is unremarkable. Stomach/Bowel: Stomach is within normal limits. Appendix appears normal. No evidence of bowel wall thickening, distention, or inflammatory changes. Sigmoid diverticulosis Vascular/Lymphatic: Aortic atherosclerosis. No enlarged abdominal or pelvic lymph nodes. Reproductive: No mass or other significant abnormality. Other: No abdominal wall hernia or abnormality. Small volume perihepatic ascites. Multiple loculated appearing air and fluid collections are again seen in the low abdomen and pelvis, most notably in the right lower quadrant measuring 9.4 x 8.8 cm (series 15, image 80), low abdomen anterior to the uterus and adjacent to the sigmoid colon measuring 9.0 x 5.1 cm (series 15, image 79), in the pelvis interposed between the rectum and uterus measuring 6.2 x 5.0 cm (series 15, image 82), and in the left hemiabdomen adjacent to mid small bowel measuring 6.2 x 3.4 cm (series 15, image 63). Surgical drains are looped about the abdomen and pelvis, with tips positioned in the central low abdomen (series 15, image 86) and in the left upper quadrant (series 15, image 61). Musculoskeletal: No acute or significant osseous findings. IMPRESSION: 1. Multiple loculated air and fluid collections are again seen throughout the abdomen and pelvis, most consistent with multifocal intra-abdominal abscesses in the setting of perforated viscus. However note that the presence or absence of infection is not established by CT. 2. Surgical drains are looped about the abdomen and pelvis, with tips positioned in the central low abdomen and in the left upper quadrant. 3.  Small volume perihepatic ascites. 4. Cholelithiasis. Aortic  Atherosclerosis (ICD10-I70.0). Electronically Signed   By: Delanna Ahmadi M.D.   On: 12/18/2021 16:55   DG Abd 1 View  Result Date: 12/11/2021 CLINICAL DATA:  Enteric catheter placement, recent laparoscopy EXAM: ABDOMEN - 1 VIEW COMPARISON:  12/11/2021 FINDINGS: Frontal view of the lower chest and upper abdomen demonstrates enteric catheter passing below diaphragm, tip overlying the gastric fundus. Surgical drains overlie the upper abdomen. No peritoneal consistent with recent surgical intervention. IMPRESSION: 1. Enteric catheter tip projecting over the gastric fundus. Electronically Signed   By: Randa Ngo M.D.   On: 12/11/2021 23:07   CT Angio Chest/Abd/Pel for Dissection W and/or Wo Contrast  Result Date: 12/11/2021 CLINICAL DATA:  Periumbilical abdominal pain and diarrhea since Friday, diaphoresis, near syncope EXAM: CT ANGIOGRAPHY CHEST, ABDOMEN AND PELVIS TECHNIQUE: Non-contrast CT of the chest was initially obtained. Multidetector CT imaging through the chest, abdomen and pelvis was performed using the standard protocol during bolus administration of intravenous contrast. Multiplanar reconstructed images and MIPs were obtained and reviewed to evaluate the vascular anatomy. RADIATION DOSE REDUCTION: This exam was performed according to the departmental dose-optimization program which includes automated exposure control, adjustment of the mA and/or kV according to patient size and/or use of iterative reconstruction technique. CONTRAST:  59m OMNIPAQUE IOHEXOL 350 MG/ML SOLN COMPARISON:  05/03/2015 FINDINGS: CTA CHEST FINDINGS Cardiovascular: Visualized portions of the heart and descending thoracic aorta are unremarkable. No pericardial effusion. No evidence of thoracic aortic aneurysm. No evidence of descending thoracic aortic dissection. Atherosclerosis of the aorta and coronary vasculature. Mediastinum/Nodes: No enlarged mediastinal, hilar, or axillary lymph nodes. Thyroid gland, trachea, and  esophagus demonstrate no significant findings. Lungs/Pleura: No acute airspace disease, effusion, or pneumothorax. Scattered emphysematous changes are seen throughout the lungs. Central airways are patent. Musculoskeletal: No acute or destructive bony lesions. Review of the MIP images confirms the above findings. CTA ABDOMEN AND PELVIS FINDINGS VASCULAR Aorta: Normal caliber aorta without aneurysm, dissection, vasculitis or significant stenosis. Mild atherosclerosis. Celiac: Patent without evidence of aneurysm, dissection, vasculitis or significant stenosis. SMA: Patent without evidence of aneurysm, dissection, vasculitis or significant stenosis. Mild atherosclerosis. Renals: Both renal arteries are patent without evidence of aneurysm, dissection, vasculitis, fibromuscular dysplasia or significant stenosis. Mild atherosclerosis. IMA: Patent without evidence of aneurysm, dissection, vasculitis or significant stenosis. Inflow: Patent without evidence of aneurysm, dissection, vasculitis or significant stenosis. Veins: No obvious venous abnormality within the limitations of this arterial phase study. Review of the MIP images confirms the above findings. NON-VASCULAR Hepatobiliary: No focal liver abnormality is seen. No gallstones, gallbladder wall thickening, or biliary dilatation. Pancreas: Unremarkable. No pancreatic ductal dilatation or surrounding inflammatory changes. Spleen: Normal in size without focal abnormality. Adrenals/Urinary Tract: Adrenal glands are unremarkable. Kidneys are normal, without renal calculi, focal lesion, or hydronephrosis. The bladder is decompressed, limiting its evaluation. Stomach/Bowel: Multiple dilated loops of jejunum are seen within the central abdomen, measuring up to 3.9 cm, likely representing ileus. There is diffuse wall thickening of the rectosigmoid colon. There is free fluid and free gas within the lower pelvis and close proximity to the thickened sigmoid colon, with findings  consistent with colitis and sigmoid colon perforation. Evaluation of the bowel is limited without oral contrast. Lymphatic: No pathologic adenopathy within the abdomen or pelvis. Reproductive: Uterus is either atrophic or surgically absent. There are no adnexal masses. Other: Diffuse pneumoperitoneum is identified. There is a small amount of free fluid throughout the right hemiabdomen and lower pelvis. Gas/fluid collection within  the central upper pelvis adjacent to the inflamed sigmoid colon measures 8.7 x 4.6 cm, consistent with a more contained area of perforation. There is no well-formed fluid collection or drainable abscess at this time. Musculoskeletal: No acute or destructive bony lesions. Reconstructed images demonstrate no additional findings. Review of the MIP images confirms the above findings. IMPRESSION: Vascular: 1. No evidence of aortic aneurysm or dissection. 2.  Aortic Atherosclerosis (ICD10-I70.0). Nonvascular: 1. Free fluid and free gas throughout the abdomen and pelvis as above, with localized gas/fluid collection adjacent to inflamed sigmoid colon within the central upper pelvis. Findings are consistent with sigmoid colitis and colonic perforation. No well-formed abscess or drainable fluid collection at this time. 2. Distended mid jejunum, likely related to ileus. Critical Value/emergent results were called by telephone at the time of interpretation on 12/11/2021 at 556 pm to provider Godfrey Pick , who verbally acknowledged these results. Electronically Signed   By: Randa Ngo M.D.   On: 12/11/2021 18:01    Labs:  CBC: Recent Labs    12/15/21 0740 12/16/21 0908 12/18/21 0325 12/19/21 0511  WBC 12.1* 12.5* 13.3* 12.9*  HGB 12.0 12.7 12.6 12.3  HCT 34.0* 39.1 39.2 37.7  PLT 245 282 385 364    COAGS: No results for input(s): "INR", "APTT" in the last 8760 hours.  BMP: Recent Labs    12/15/21 0740 12/16/21 0908 12/18/21 0325 12/19/21 0511  NA 138 141 141 142  K 3.9 3.9  4.2 4.4  CL 100 101 107 103  CO2 26 27 19* 26  GLUCOSE 115* 134* 134* 153*  BUN _0 CALCIUM 8.2* 8.2* 8.2* 8.7*  CREATININE 1.03* 0.92 1.01* 0.77  GFRNONAA >60 >60 >60 >60    LIVER FUNCTION TESTS: Recent Labs    11/14/21 1606 12/11/21 1625  BILITOT 0.3 0.5  AST 37 33  ALT 34 21  ALKPHOS 49 109  PROT 7.0 6.0*  ALBUMIN 4.1 2.0*      Assessment and Plan:  51 y.o. female inpatient. History of morbid obesity, HTN, HLD, Presented to the ED on 11.13.23 with severe abd pain. Found to have a pneumoperitoneum  s/p laparoscopic peritoneal lavage with surgical drain placement on 11.13.23.  CT abd pelvis shows multiple fluid and gas collections. Team is requesting abscess drain placement.   CT abd pelvis from 11.20.23 reads Multiple loculated air and fluid collections are again seen throughout the abdomen and pelvis, most consistent with multifocal intra-abdominal abscesses in the setting of perforated viscus. However note that the presence or absence of infection is not established by CT.She is on heparin prophylactic . WBC 12.9. Blood cultures positive for bacteroides thetaiotaomicron on 11.18.23. Patient is afebrile. No pertinent allergies. Patient has been NPO since midnight  Risks and benefits discussed with the patient including bleeding, infection, damage to adjacent structures, bowel perforation/fistula connection, and sepsis.  All of the patient's questions were answered, patient is agreeable to proceed. Consent signed and in chart.   Thank you for this interesting consult.  I greatly enjoyed meeting [name] and look forward to participating in their care.  A copy of this report was sent to the requesting provider on this date.  Electronically Signed: Jacqualine Mau, NP 12/19/2021, 10:12 AM   I spent a total of 40 Minutes    in face to face in clinical consultation, greater than 50% of which was counseling/coordinating care for intra abdominal  abscesses drain placements

## 2021-12-19 NOTE — Plan of Care (Signed)

## 2021-12-20 DIAGNOSIS — K668 Other specified disorders of peritoneum: Secondary | ICD-10-CM | POA: Diagnosis not present

## 2021-12-20 LAB — BASIC METABOLIC PANEL
Anion gap: 11 (ref 5–15)
BUN: 8 mg/dL (ref 6–20)
CO2: 29 mmol/L (ref 22–32)
Calcium: 8.7 mg/dL — ABNORMAL LOW (ref 8.9–10.3)
Chloride: 101 mmol/L (ref 98–111)
Creatinine, Ser: 0.79 mg/dL (ref 0.44–1.00)
GFR, Estimated: >60 mL/min (ref >60)
Glucose, Bld: 129 mg/dL — ABNORMAL HIGH (ref 70–99)
Potassium: 3.7 mmol/L (ref 3.5–5.1)
Sodium: 141 mmol/L (ref 135–145)

## 2021-12-20 LAB — CBC
HCT: 35.8 % — ABNORMAL LOW (ref 36.0–46.0)
Hemoglobin: 11.6 g/dL — ABNORMAL LOW (ref 12.0–15.0)
MCH: 30.4 pg (ref 26.0–34.0)
MCHC: 32.4 g/dL (ref 30.0–36.0)
MCV: 93.7 fL (ref 80.0–100.0)
Platelets: 556 10*3/uL — ABNORMAL HIGH (ref 150–400)
RBC: 3.82 MIL/uL — ABNORMAL LOW (ref 3.87–5.11)
RDW: 15 % (ref 11.5–15.5)
WBC: 12.3 10*3/uL — ABNORMAL HIGH (ref 4.0–10.5)
nRBC: 0 % (ref 0.0–0.2)

## 2021-12-20 LAB — GLUCOSE, CAPILLARY
Glucose-Capillary: 105 mg/dL — ABNORMAL HIGH (ref 70–99)
Glucose-Capillary: 109 mg/dL — ABNORMAL HIGH (ref 70–99)
Glucose-Capillary: 116 mg/dL — ABNORMAL HIGH (ref 70–99)
Glucose-Capillary: 119 mg/dL — ABNORMAL HIGH (ref 70–99)
Glucose-Capillary: 122 mg/dL — ABNORMAL HIGH (ref 70–99)
Glucose-Capillary: 136 mg/dL — ABNORMAL HIGH (ref 70–99)

## 2021-12-20 LAB — BLOOD GAS, VENOUS
Acid-Base Excess: 7.4 mmol/L — ABNORMAL HIGH (ref 0.0–2.0)
Bicarbonate: 34.7 mmol/L — ABNORMAL HIGH (ref 20.0–28.0)
O2 Saturation: 80.4 %
Patient temperature: 36.8
pCO2, Ven: 59 mmHg (ref 44–60)
pH, Ven: 7.37 (ref 7.25–7.43)
pO2, Ven: 48 mmHg — ABNORMAL HIGH (ref 32–45)

## 2021-12-20 LAB — MAGNESIUM: Magnesium: 2.2 mg/dL (ref 1.7–2.4)

## 2021-12-20 MED ORDER — SODIUM CHLORIDE 0.9 % IV SOLN
150.0000 mg | INTRAVENOUS | Status: DC
  Administered 2021-12-20 – 2021-12-23 (×4): 150 mg via INTRAVENOUS
  Filled 2021-12-20 (×4): qty 7.5

## 2021-12-20 NOTE — Progress Notes (Signed)
[facility] Progress Note  9 Days Post-Op  Subjective: CC-  Pain yesterday in lower abdomen. Reports improved after IR drains and no abdominal pain currently. Tolerated a burger for dinner yesterday. No n/v. BM yesterday per report (not on I/O).   S/p IR drain x 4 (right lower abdomen/pelvis and right mid abdomen, left lower abdomen/pelvis, left mid abdomen). 300 cc total of feculent, foul smelling fluid aspirated from all drainage catheters, with the most being aspirated from the lower two abdominal/pelvic drains, right greater than left.   WBC down slightly at 12.3. Afebrile. Tachycardic overnight, this resolved this am. No hypotension.    Objective: Vital signs in last 24 hours: Temp:  [98.1 F (36.7 C)-99.3 F (37.4 C)] 98.1 F (36.7 C) (11/21 2020) Pulse Rate:  [81-110] 81 (11/21 2319) Resp:  [18-24] 18 (11/21 2319) BP: (128-184)/(81-100) 154/88 (11/21 2020) SpO2:  [90 %-97 %] 95 % (11/21 2319) Last BM Date : 12/17/21  Intake/Output from previous day: 11/21 0701 - 11/22 0700 In: 1329.3 [P.O.:1200; IV Piggyback:129.3] Out: 401 [Urine:1; Drains:100] Intake/Output this shift: No intake/output data recorded.  PE: Gen: Alert, NAD Abd: obese, soft, some lower abdominal ttp but no rigidity or guarding, Incisions clean and dry with no erythema or induration.  Surgical JP #1 feculent (5cc/24hr), #2 (15cc) serous and #3 (35cc) dark SS  IR drain #1 (left lateral abdomen) - serous, IR drain #2 (Left mid abdomen) feculent, IR drain #3 (R lateral abdomen) serous, IR drain #4 (R mid abdomen) feculent.   Surgical Drain position per op note RUQ Drain: Right pericolic gutter into pelvis (drain #1) LUQ Drain: Suprahepatic (drain #2) Left mid abdominal drain: pelvis (drain #3)  Lab Results:  Recent Labs    12/19/21 0511 12/20/21 0416  WBC 12.9* 12.3*  HGB 12.3 11.6*  HCT 37.7 35.8*  PLT 364 556*    BMET Recent Labs    12/19/21 0511 12/20/21 0416  NA 142 141   K 4.4 3.7  CL 103 101  CO2 26 29  GLUCOSE 153* 129*  BUN 7 8  CREATININE 0.77 0.79  CALCIUM 8.7* 8.7*    PT/INR No results for input(s): "LABPROT", "INR" in the last 72 hours. CMP     Component Value Date/Time   NA 141 12/20/2021 0416   K 3.7 12/20/2021 0416   CL 101 12/20/2021 0416   CO2 29 12/20/2021 0416   GLUCOSE 129 (H) 12/20/2021 0416   BUN 8 12/20/2021 0416   CREATININE 0.79 12/20/2021 0416   CALCIUM 8.7 (L) 12/20/2021 0416   PROT 6.0 (L) 12/11/2021 1625   ALBUMIN 2.0 (L) 12/11/2021 1625   AST 33 12/11/2021 1625   ALT 21 12/11/2021 1625   ALKPHOS 109 12/11/2021 1625   BILITOT 0.5 12/11/2021 1625   GFRNONAA >60 12/20/2021 0416   GFRAA >60 05/11/2019 1411   Lipase     Component Value Date/Time   LIPASE 21 12/11/2021 1625       Studies/Results: CT ABDOMEN PELVIS W CONTRAST  Result Date: 12/18/2021 CLINICAL DATA:  Postoperative abdominal pain, status post EXAM: CT ABDOMEN AND PELVIS WITH CONTRAST TECHNIQUE: Multidetector CT imaging of the abdomen and pelvis was performed using the standard protocol following bolus administration of intravenous contrast. RADIATION DOSE REDUCTION: This exam was performed according to the departmental dose-optimization program which includes automated exposure control, adjustment of the mA and/or kV according to patient size and/or use of iterative reconstruction technique. CONTRAST:  147m OMNIPAQUE IOHEXOL 350 MG/ML SOLN COMPARISON:  CTA  chest abdomen pelvis, 12/11/2021 FINDINGS: Lower chest: Bandlike dependent bibasilar atelectasis. Hepatobiliary: No solid liver abnormality is seen. Tiny gallstone. No gallbladder wall thickening, or biliary dilatation. Pancreas: Unremarkable. No pancreatic ductal dilatation or surrounding inflammatory changes. Spleen: Normal in size without significant abnormality. Adrenals/Urinary Tract: Adrenal glands are unremarkable. Kidneys are normal, without renal calculi, solid lesion, or hydronephrosis.  Bladder is unremarkable. Stomach/Bowel: Stomach is within normal limits. Appendix appears normal. No evidence of bowel wall thickening, distention, or inflammatory changes. Sigmoid diverticulosis Vascular/Lymphatic: Aortic atherosclerosis. No enlarged abdominal or pelvic lymph nodes. Reproductive: No mass or other significant abnormality. Other: No abdominal wall hernia or abnormality. Small volume perihepatic ascites. Multiple loculated appearing air and fluid collections are again seen in the low abdomen and pelvis, most notably in the right lower quadrant measuring 9.4 x 8.8 cm (series 15, image 80), low abdomen anterior to the uterus and adjacent to the sigmoid colon measuring 9.0 x 5.1 cm (series 15, image 79), in the pelvis interposed between the rectum and uterus measuring 6.2 x 5.0 cm (series 15, image 82), and in the left hemiabdomen adjacent to mid small bowel measuring 6.2 x 3.4 cm (series 15, image 63). Surgical drains are looped about the abdomen and pelvis, with tips positioned in the central low abdomen (series 15, image 86) and in the left upper quadrant (series 15, image 61). Musculoskeletal: No acute or significant osseous findings. IMPRESSION: 1. Multiple loculated air and fluid collections are again seen throughout the abdomen and pelvis, most consistent with multifocal intra-abdominal abscesses in the setting of perforated viscus. However note that the presence or absence of infection is not established by CT. 2. Surgical drains are looped about the abdomen and pelvis, with tips positioned in the central low abdomen and in the left upper quadrant. 3. Small volume perihepatic ascites. 4. Cholelithiasis. Aortic Atherosclerosis (ICD10-I70.0). Electronically Signed   By: Delanna Ahmadi M.D.   On: 12/18/2021 16:55    Anti-infectives: Anti-infectives (From admission, onward)    Start     Dose/Rate Route Frequency Ordered Stop   12/20/21 1000  micafungin (MYCAMINE) 150 mg in sodium chloride 0.9 %  100 mL IVPB        150 mg 107.5 mL/hr over 1 Hours Intravenous Every 24 hours 12/20/21 0904     12/12/21 0600  ceFEPIme (MAXIPIME) 2 g in sodium chloride 0.9 % 100 mL IVPB  Status:  Discontinued        2 g 200 mL/hr over 30 Minutes Intravenous Every 12 hours 12/11/21 2129 12/11/21 2323   12/12/21 0600  piperacillin-tazobactam (ZOSYN) IVPB 3.375 g        3.375 g 12.5 mL/hr over 240 Minutes Intravenous Every 8 hours 12/11/21 2326     12/11/21 2330  piperacillin-tazobactam (ZOSYN) IVPB 3.375 g        3.375 g 100 mL/hr over 30 Minutes Intravenous To Post Anesthesia Care Unit 12/11/21 2326 12/12/21 0041   12/11/21 1630  metroNIDAZOLE (FLAGYL) IVPB 500 mg        500 mg 100 mL/hr over 60 Minutes Intravenous  Once 12/11/21 1619 12/11/21 2049   12/11/21 1630  ceFEPIme (MAXIPIME) 2 g in sodium chloride 0.9 % 100 mL IVPB        2 g 200 mL/hr over 30 Minutes Intravenous  Once 12/11/21 1623 12/11/21 1817        Assessment/Plan POD 9, s/p diagnostic lap washout, evacuation of abscess, and JP drain placement x3, Dr. Thermon Leyland 11/13 Operative Findings: Well formed abscess  cavity in the lower mid abdomen near the sigmoid colon, purulent peritonitis with fibrinous exudate throughout the abdomen.  No clear source of the infection, though perforated sigmoid diverticulitis is the most likely culprit.  - S/p IR drain x 4 11/21. 300 cc total of feculent, foul smelling fluid aspirated from all drainage catheters, with the most being aspirated from the lower two abdominal/pelvic drains, right greater than left. Cx pending but w/ some budding yeast - on Zosyn and Micafungin. Cont - Cont IR and surgical drains.  - I am concerned about the change in output from her drains that now looks feculent. The etiology in the OR was thought to be perforated sigmoid diverticulitis so it could be that she is currently controlled with drains and abx as she appears stable with resolution of tachycardia, afebrile overnight, no  hypotension, downtrending wbc w/ reassuring exam. Will make NPO and discuss with MD. I think there is still a possibility she may require another surgery during admission that would likely result in an ostomy if she does not improve. Cont abx.     FEN - NPO, IVF per [facility] VTE - SCDs, sq heparin ID - currently zosyn and Micafungin    Bacteremia - Bacteroides Thetaiotaomicron  HTN Morbid obesity OSA CHF Bipolar disorder HLD Seizures    LOS: 9 days    Jillyn Ledger, [facility] 12/20/2021, 9:15 AM Please see Amion for pager number during day hours 7:00am-4:30pm

## 2021-12-20 NOTE — Progress Notes (Signed)
Pharmacy Antibiotic Note  [name] is a 51 y.o. female admitted on 12/11/2021 with intra-abdominal abscess s/p peritoneal lavage with drain placement. Patient has been receiving Zosyn therapy since 11/13 for IAI and bacteremia. Repeat CT scan on 11/20 showed multiple abscesses. IR placed new drains on 11/21. Pharmacy has been consulted for Zosyn dosing. Drain cultures from 11/21 showing rare budding yeast - pharmacy consulted to add micafungin.   Plan: Continue Zosyn 3.375g IV q8h (4 hour infusion). Add micafungin '150mg'$  IV q24 hours  F/u culture data, clinical improvement  Height: '5\' 5"'$  (165.1 cm) Weight: (!) 143.3 kg (315 lb 14.7 oz) IBW/kg (Calculated) : 57  Temp (24hrs), Avg:98.5 F (36.9 C), Min:98.1 F (36.7 C), Max:99.3 F (37.4 C)  Recent Labs  Lab 12/15/21 0740 12/16/21 0908 12/18/21 0325 12/19/21 0511 12/20/21 0416  WBC 12.1* 12.5* 13.3* 12.9* 12.3*  CREATININE 1.03* 0.92 1.01* 0.77 0.79     Estimated Creatinine Clearance: 118.8 mL/min (by C-G formula based on SCr of 0.79 mg/dL).    Allergies  Allergen Reactions   Savella [Milnacipran Hcl] Other (See Comments)    mania   Antimicrobials this admission:  Cefepime/Flagyl x1 11/13 Zosyn 11/13 >>  Micafungin 11/22 >>  Dose adjustments this admission:   Microbiology results:  11/13 blood: 2/4 bottles Bacteroides thetaiotaomicron, beta-lactamase pos, no BCID run 11/13 peritoneal fluid: E coli (R-amp only), abundant Bacteroides thetaiotaomicron - final 11/13 fungus peritoneal fluid: none observed, final pending 11/13 MRSA PCR: neg 11/21 abscess drain: gram stain w/ few GNR, rare budding yeast   Thank you for allowing pharmacy to be a part of this patient's care.  Dimple Nanas, PharmD, BCPS 12/20/2021 9:05 AM

## 2021-12-20 NOTE — Progress Notes (Signed)
PROGRESS NOTE    [name]  BMW:[id number] DOB: 27-Sep-1970 DOA: 12/11/2021 PCP: [place], Betty G, MD    Brief Narrative:   [name] is a 51 y.o. female with past medical history significant for OSA/OHS on CPAP, bipolar 1 disorder, Hx chronic systolic congestive heart failure/cardiomyopathy who presented to [facility] ED on 11/13 with abdominal pain.  In the ED, CT abdomen/pelvis notable for free air and fluid.  General surgery was consulted and patient was taken emergently to the operating room with finding of a well-formed abscess cavity in the lower mid abdomen near the sigmoid colon with purulent peritonitis and fibrinous exudate suspected secondary to perforated sigmoid diverticulitis.  Patient had laparoscopic peritoneal lavage with drains placed and patient was transferred to the intensive care unit postoperatively as she remained intubated but was successfully extubated the following day.  Given continued purulence from drain output, repeat CT abdomen/pelvis was performed on 11/20 with findings of multiple fluid and gas collections; IR was consulted on 11/21 for repeat drain placement.  Assessment & Plan:   Abdominal abscess/peritonitis 2/2 presumed perforated sigmoid diverticulitis Patient presenting to ED with abdominal pain, CT abdomen/pelvis notable for free air and fluid.  General surgery was consulted and patient was taken emergently to the OR with findings of a well-formed abscess cavity lower mid abdomen near the sigmoid colon with purulent peritonitis and fibrinous exudate suspected secondary to a perforated sigmoid diverticulitis; and underwent laparoscopic peritoneal lavage with drain placement.  Repeat CT abdomen/pelvis 7/20 with multiple air-fluid levels and general surgery consulted IR for further drain placement under IR guidance. -- General surgery following, appreciate assistance -- WBC 5.6>>18.8>>12.5>13.3>12.9>12.3 -- Blood cultures 11/13: + Bacteroides  thetaiotaomicron -- Operative culture 11/13: E. coli, abundant Bacteroides thetaiotaomicron -- Culture from IR drain placement 11/21: Moderate GPR's, few GNR's, rare budding yeast -- Zosyn 3.375 g IV every 8 hours -- Micafungin 150 mg IV every 24 hours -- Currently general surgery continues to recommend no further operative management this time and recommend drainage for now and monitoring, IV antibiotics -- Continue to monitor drain output closely (currently 7 drains; 3 placed by CCS and 4 placed by IR). -- CBC daily  OSA/OHS Follows with pulmonology outpatient, Dr. Elsworth Soho. -- Continue nocturnal CPAP  Hx chronic systolic/diastolic congestive heart failure/cardiomyopathy Follows with cardiology outpatient, Dr. Percival Spanish.  Most recent echocardiogram February 2022 with now improved LVEF of 60 to 27%, grade 1 diastolic dysfunction --Continue to hold home Lasix  Hx seizures, bipolar disorder, depression --Continue alprazolam '1mg'$  PO TID, Lyrica '300mg'$  PO BID, Vraylar '6mg'$  PO qHS  Acute renal failure Etiology likely secondary to infection versus dehydration from abdominal abscess/peritonitis as above.  Home Lasix on hold, supported with IV fluids with now normalization of creatinine, 0.79. --Continue intermittent monitoring of renal function  GERD: --Protonix 40 mg p.o. twice daily  Morbid obesity Body mass index is 52.57 kg/m.  Discussed with patient needs for aggressive lifestyle changes/weight loss as this complicates all facets of care.  Outpatient follow-up with PCP.  May benefit from bariatric evaluation outpatient.   DVT prophylaxis: heparin injection 5,000 Units Start: 12/12/21 0600    Code Status: Full Code Family Communication: None present at bedside this morning  Disposition Plan:  Level of care: Med-Surg Status is: Inpatient Remains inpatient appropriate because: IV antibiotics    Consultants:  General surgery Interventional radiology  Procedures:  Laparoscopic  peritoneal lavage with drain placement x 3, Dr. Thermon Leyland; 11/13 CT guided drain placement x 4; IR, 11/21, Dr.  Watts  Antimicrobials:  Zosyn 11/13>> Micafungin 11/22>> Metronidazole 11/13 - 11/13   Subjective: Patient seen and examined bedside, resting comfortably.  No specific complaints this morning, 4 additional drains placed by IR yesterday with continued feculent material and output.  Discussed with general surgery this morning, continuing IV antibiotics and conservative measures without indication for additional surgical intervention at this time.  Patient denies headache, no fever/chills/night sweats, no nausea/vomiting, no diarrhea, no chest pain, no palpitations, no shortness of breath, no abdominal pain, no cough/congestion.  No acute events overnight per nursing staff.  Objective: Vitals:   12/19/21 1714 12/19/21 2020 12/19/21 2319 12/20/21 1147  BP: (!) 155/83 (!) 154/88  121/79  Pulse: (!) 104 (!) 110 81 (!) 110  Resp: '19 18 18 19  '$ Temp: 99.3 F (37.4 C) 98.1 F (36.7 C)  98.9 F (37.2 C)  TempSrc: Oral Oral  Oral  SpO2: 95% 96% 95% 92%  Weight:      Height:        Intake/Output Summary (Last 24 hours) at 12/20/2021 1449 Last data filed at 12/20/2021 0600 Gross per 24 hour  Intake 1329.32 ml  Output 401 ml  Net 928.32 ml   Filed Weights   12/11/21 2114 12/18/21 0432  Weight: (!) 138.6 kg (!) 143.3 kg    Examination:  Physical Exam: GEN: NAD, alert and oriented x 3, obese; appears older than stated age, chronically ill in appearance HEENT: NCAT, PERRL, EOMI, sclera clear, MMM PULM: CTAB w/o wheezes/crackles, normal respiratory effort, on room air CV: RRR w/o M/G/R GI: abd soft, mild lower abdominal pain on palpation, NABS, no R/G/M; abdominal drains x 7 noted MSK: no peripheral edema, moves all extremities independently NEURO: CN II-XII intact, no focal deficits, sensation to light touch intact PSYCH: normal mood/affect Integumentary: Abdominal  drains/surgical incision sites noted, otherwise no other concerning rashes/lesions/wounds noted on exposed skin surfaces.      Data Reviewed: I have personally reviewed following labs and imaging studies  CBC: Recent Labs  Lab 12/15/21 0740 12/16/21 0908 12/18/21 0325 12/19/21 0511 12/20/21 0416  WBC 12.1* 12.5* 13.3* 12.9* 12.3*  HGB 12.0 12.7 12.6 12.3 11.6*  HCT 34.0* 39.1 39.2 37.7 35.8*  MCV 88.1 93.5 94.7 93.8 93.7  PLT 245 282 385 364 174*   Basic Metabolic Panel: Recent Labs  Lab 12/15/21 0740 12/16/21 0908 12/18/21 0325 12/19/21 0511 12/20/21 0416  NA 138 141 141 142 141  K 3.9 3.9 4.2 4.4 3.7  CL 100 101 107 103 101  CO2 26 27 19* 26 29  GLUCOSE 115* 134* 134* 153* 129*  BUN '16 11 9 7 8  '$ CREATININE 1.03* 0.92 1.01* 0.77 0.79  CALCIUM 8.2* 8.2* 8.2* 8.7* 8.7*  MG 2.2 2.1 2.1  --  2.2   GFR: Estimated Creatinine Clearance: 118.8 mL/min (by C-G formula based on SCr of 0.79 mg/dL). Liver Function Tests: No results for input(s): "AST", "ALT", "ALKPHOS", "BILITOT", "PROT", "ALBUMIN" in the last 168 hours. No results for input(s): "LIPASE", "AMYLASE" in the last 168 hours. No results for input(s): "AMMONIA" in the last 168 hours. Coagulation Profile: No results for input(s): "INR", "PROTIME" in the last 168 hours. Cardiac Enzymes: No results for input(s): "CKTOTAL", "CKMB", "CKMBINDEX", "TROPONINI" in the last 168 hours. BNP (last 3 results) No results for input(s): "PROBNP" in the last 8760 hours. HbA1C: No results for input(s): "HGBA1C" in the last 72 hours. CBG: Recent Labs  Lab 12/19/21 2022 12/20/21 0033 12/20/21 0403 12/20/21 0735 12/20/21 1141  GLUCAP 129* 122* 119* 116* 109*   Lipid Profile: No results for input(s): "CHOL", "HDL", "LDLCALC", "TRIG", "CHOLHDL", "LDLDIRECT" in the last 72 hours. Thyroid Function Tests: No results for input(s): "TSH", "T4TOTAL", "FREET4", "T3FREE", "THYROIDAB" in the last 72 hours. Anemia Panel: No results  for input(s): "VITAMINB12", "FOLATE", "FERRITIN", "TIBC", "IRON", "RETICCTPCT" in the last 72 hours. Sepsis Labs: No results for input(s): "PROCALCITON", "LATICACIDVEN" in the last 168 hours.  Recent Results (from the past 240 hour(s))  Blood culture (routine x 2)     Status: Abnormal   Collection Time: 12/11/21  4:58 PM   Specimen: BLOOD LEFT HAND  Result Value Ref Range Status   Specimen Description BLOOD LEFT HAND  Final   Special Requests   Final    BOTTLES DRAWN AEROBIC AND ANAEROBIC Blood Culture adequate volume   Culture  Setup Time   Final    GRAM NEGATIVE RODS ANAEROBIC BOTTLE ONLY CRITICAL VALUE NOTED.  VALUE IS CONSISTENT WITH PREVIOUSLY REPORTED AND CALLED VALUE.    Culture (A)  Final    BACTEROIDES THETAIOTAOMICRON BETA LACTAMASE POSITIVE Performed at [facility], [place] [address], [place], [place] [address]    Report Status 12/17/2021 FINAL  Final  Blood culture (routine x 2)     Status: Abnormal   Collection Time: 12/11/21  5:39 PM   Specimen: BLOOD  Result Value Ref Range Status   Specimen Description BLOOD SITE NOT SPECIFIED  Final   Special Requests   Final    BOTTLES DRAWN AEROBIC AND ANAEROBIC Blood Culture results may not be optimal due to an inadequate volume of blood received in culture bottles   Culture  Setup Time   Final    GRAM NEGATIVE RODS ANAEROBIC BOTTLE ONLY CRITICAL RESULT CALLED TO, READ BACK BY AND VERIFIED WITH: PHARMD ABBOTT 12/15/21 @ 0153 BY AB    Culture (A)  Final    BACTEROIDES THETAIOTAOMICRON BETA LACTAMASE POSITIVE Performed at [facility], [place] [address] [place], [place] [address]    Report Status 12/16/2021 FINAL  Final  Fungus Culture With Stain     Status: None (Preliminary result)   Collection Time: 12/11/21  9:03 PM   Specimen: PATH Cytology Peritoneal fluid; Body Fluid  Result Value Ref Range Status   Fungus Stain Final report  Final    Comment: (NOTE) Performed At: [facility] [place], [place] [id number] Rush Farmer MD WC:[id number]    Fungus (Mycology) Culture PENDING  Incomplete   Fungal Source FLUID  Final    Comment: PERITONEAL Performed at [facility], [place] [address], [place], [place] [address]   Aerobic/Anaerobic Culture w Gram Stain (surgical/deep wound)     Status: None   Collection Time: 12/11/21  9:03 PM   Specimen: PATH Cytology Peritoneal fluid; Body Fluid  Result Value Ref Range Status   Specimen Description FLUID PERITONEAL  Final   Special Requests NONE  Final   Gram Stain   Final    FEW WBC PRESENT,BOTH PMN AND MONONUCLEAR FEW GRAM VARIABLE ROD    Culture   Final    FEW ESCHERICHIA COLI ABUNDANT BACTEROIDES THETAIOTAOMICRON BETA LACTAMASE NEGATIVE Performed at [facility], [place] [address] [place], [place] [address]    Report Status 12/14/2021 FINAL  Final   Organism ID, Bacteria ESCHERICHIA COLI  Final      Susceptibility   Escherichia coli - MIC*    AMPICILLIN >=32 RESISTANT Resistant     CEFAZOLIN <=  4 SENSITIVE Sensitive     CEFEPIME <=0.12 SENSITIVE Sensitive     CEFTAZIDIME <=1 SENSITIVE Sensitive     CEFTRIAXONE <=0.25 SENSITIVE Sensitive     CIPROFLOXACIN <=0.25 SENSITIVE Sensitive     GENTAMICIN <=1 SENSITIVE Sensitive     IMIPENEM <=0.25 SENSITIVE Sensitive     TRIMETH/SULFA <=20 SENSITIVE Sensitive     AMPICILLIN/SULBACTAM 4 SENSITIVE Sensitive     PIP/TAZO <=4 SENSITIVE Sensitive     * FEW ESCHERICHIA COLI  Fungus Culture Result     Status: None   Collection Time: 12/11/21  9:03 PM  Result Value Ref Range Status   Result 1 Comment  Final    Comment: (NOTE) KOH/Calcofluor preparation:  no fungus observed. Performed At: [facility] [place], [place] [id number] Rush Farmer MD QH:[id number]   MRSA Next Gen by PCR, Nasal     Status: None   Collection Time: 12/11/21 11:37 PM   Specimen: Nasal Mucosa; Nasal Swab  Result Value Ref Range Status   MRSA by PCR Next Gen NOT  DETECTED NOT DETECTED Final    Comment: (NOTE) The GeneXpert MRSA Assay (FDA approved for NASAL specimens only), is one component of a comprehensive MRSA colonization surveillance program. It is not intended to diagnose MRSA infection nor to guide or monitor treatment for MRSA infections. Test performance is not FDA approved in patients less than 46 years old. Performed at [facility], [place] [address] [place], [place] [address]   Aerobic/Anaerobic Culture w Gram Stain (surgical/deep wound)     Status: None (Preliminary result)   Collection Time: 12/19/21  4:58 PM   Specimen: Wound; Abscess  Result Value Ref Range Status   Specimen Description WOUND  Final   Special Requests POST CT GUIDED DRAIN PLACEMENT  Final   Gram Stain   Final    FEW WBC PRESENT, PREDOMINANTLY PMN MODERATE GRAM POSITIVE RODS FEW GRAM NEGATIVE RODS RARE BUDDING YEAST SEEN    Culture   Final    NO GROWTH < 12 HOURS Performed at [facility], [address] [place], [place] [address]    Report Status PENDING  Incomplete         Radiology Studies: CT ABDOMEN PELVIS W CONTRAST  Result Date: 12/18/2021 CLINICAL DATA:  Postoperative abdominal pain, status post EXAM: CT ABDOMEN AND PELVIS WITH CONTRAST TECHNIQUE: Multidetector CT imaging of the abdomen and pelvis was performed using the standard protocol following bolus administration of intravenous contrast. RADIATION DOSE REDUCTION: This exam was performed according to the departmental dose-optimization program which includes automated exposure control, adjustment of the mA and/or kV according to patient size and/or use of iterative reconstruction technique. CONTRAST:  128m OMNIPAQUE IOHEXOL 350 MG/ML SOLN COMPARISON:  CTA chest abdomen pelvis, 12/11/2021 FINDINGS: Lower chest: Bandlike dependent bibasilar atelectasis. Hepatobiliary: No solid liver abnormality is seen. Tiny gallstone. No gallbladder wall thickening, or biliary dilatation. Pancreas:  Unremarkable. No pancreatic ductal dilatation or surrounding inflammatory changes. Spleen: Normal in size without significant abnormality. Adrenals/Urinary Tract: Adrenal glands are unremarkable. Kidneys are normal, without renal calculi, solid lesion, or hydronephrosis. Bladder is unremarkable. Stomach/Bowel: Stomach is within normal limits. Appendix appears normal. No evidence of bowel wall thickening, distention, or inflammatory changes. Sigmoid diverticulosis Vascular/Lymphatic: Aortic atherosclerosis. No enlarged abdominal or pelvic lymph nodes. Reproductive: No mass or other significant abnormality. Other: No abdominal wall hernia or abnormality. Small volume perihepatic ascites. Multiple loculated appearing air and fluid collections are again seen in the low abdomen and  pelvis, most notably in the right lower quadrant measuring 9.4 x 8.8 cm (series 15, image 80), low abdomen anterior to the uterus and adjacent to the sigmoid colon measuring 9.0 x 5.1 cm (series 15, image 79), in the pelvis interposed between the rectum and uterus measuring 6.2 x 5.0 cm (series 15, image 82), and in the left hemiabdomen adjacent to mid small bowel measuring 6.2 x 3.4 cm (series 15, image 63). Surgical drains are looped about the abdomen and pelvis, with tips positioned in the central low abdomen (series 15, image 86) and in the left upper quadrant (series 15, image 61). Musculoskeletal: No acute or significant osseous findings. IMPRESSION: 1. Multiple loculated air and fluid collections are again seen throughout the abdomen and pelvis, most consistent with multifocal intra-abdominal abscesses in the setting of perforated viscus. However note that the presence or absence of infection is not established by CT. 2. Surgical drains are looped about the abdomen and pelvis, with tips positioned in the central low abdomen and in the left upper quadrant. 3. Small volume perihepatic ascites. 4. Cholelithiasis. Aortic Atherosclerosis  (ICD10-I70.0). Electronically Signed   By: Delanna Ahmadi M.D.   On: 12/18/2021 16:55        Scheduled Meds:  acetaminophen  1,000 mg Oral Q6H   ALPRAZolam  1 mg Oral TID   amphetamine-dextroamphetamine  20 mg Oral TID   cariprazine  6 mg Oral QPM   Dextromethorphan-quiNIDine  1 capsule Oral Q12H   docusate sodium  100 mg Oral BID   heparin injection (subcutaneous)  5,000 Units Subcutaneous Q8H   insulin aspart  0-20 Units Subcutaneous Q4H   lidocaine  20 mL Intradermal Once   methocarbamol  1,000 mg Oral Q8H   mouth rinse  15 mL Mouth Rinse 4 times per day   pantoprazole  40 mg Oral BID   polyethylene glycol  17 g Oral Daily   pregabalin  300 mg Oral BID   sodium chloride flush  5 mL Intracatheter Q8H   Continuous Infusions:  micafungin (MYCAMINE) 150 mg in sodium chloride 0.9 % 100 mL IVPB 150 mg (12/20/21 1156)   piperacillin-tazobactam (ZOSYN)  IV 3.375 g (12/20/21 1444)     LOS: 9 days    Time spent: 56 minutes spent on chart review, discussion with nursing staff, consultants, updating family and interview/physical exam; more than 50% of that time was spent in counseling and/or coordination of care.    [facility] [place] DO [facility] Available via Epic secure chat 7am-7pm After these hours, please refer to coverage provider listed on [email or web address] 12/20/2021, 2:49 PM

## 2021-12-21 DIAGNOSIS — K668 Other specified disorders of peritoneum: Secondary | ICD-10-CM | POA: Diagnosis not present

## 2021-12-21 LAB — GLUCOSE, CAPILLARY
Glucose-Capillary: 106 mg/dL — ABNORMAL HIGH (ref 70–99)
Glucose-Capillary: 114 mg/dL — ABNORMAL HIGH (ref 70–99)
Glucose-Capillary: 117 mg/dL — ABNORMAL HIGH (ref 70–99)
Glucose-Capillary: 119 mg/dL — ABNORMAL HIGH (ref 70–99)
Glucose-Capillary: 119 mg/dL — ABNORMAL HIGH (ref 70–99)
Glucose-Capillary: 123 mg/dL — ABNORMAL HIGH (ref 70–99)
Glucose-Capillary: 125 mg/dL — ABNORMAL HIGH (ref 70–99)
Glucose-Capillary: 149 mg/dL — ABNORMAL HIGH (ref 70–99)

## 2021-12-21 LAB — BASIC METABOLIC PANEL
Anion gap: 11 (ref 5–15)
BUN: 8 mg/dL (ref 6–20)
CO2: 27 mmol/L (ref 22–32)
Calcium: 8.6 mg/dL — ABNORMAL LOW (ref 8.9–10.3)
Chloride: 101 mmol/L (ref 98–111)
Creatinine, Ser: 0.77 mg/dL (ref 0.44–1.00)
GFR, Estimated: >60 mL/min (ref >60)
Glucose, Bld: 137 mg/dL — ABNORMAL HIGH (ref 70–99)
Potassium: 4.2 mmol/L (ref 3.5–5.1)
Sodium: 139 mmol/L (ref 135–145)

## 2021-12-21 LAB — CBC
HCT: 36.6 % (ref 36.0–46.0)
HCT: 37.3 % (ref 36.0–46.0)
Hemoglobin: 11.8 g/dL — ABNORMAL LOW (ref 12.0–15.0)
Hemoglobin: 12.1 g/dL (ref 12.0–15.0)
MCH: 29.7 pg (ref 26.0–34.0)
MCH: 30.6 pg (ref 26.0–34.0)
MCHC: 31.6 g/dL (ref 30.0–36.0)
MCHC: 33.1 g/dL (ref 30.0–36.0)
MCV: 92.7 fL (ref 80.0–100.0)
MCV: 94 fL (ref 80.0–100.0)
Platelets: 550 10*3/uL — ABNORMAL HIGH (ref 150–400)
Platelets: 596 10*3/uL — ABNORMAL HIGH (ref 150–400)
RBC: 3.95 MIL/uL (ref 3.87–5.11)
RBC: 3.97 MIL/uL (ref 3.87–5.11)
RDW: 14.8 % (ref 11.5–15.5)
RDW: 15 % (ref 11.5–15.5)
WBC: 11.6 10*3/uL — ABNORMAL HIGH (ref 4.0–10.5)
WBC: 12.1 10*3/uL — ABNORMAL HIGH (ref 4.0–10.5)
nRBC: 0 % (ref 0.0–0.2)
nRBC: 0 % (ref 0.0–0.2)

## 2021-12-21 LAB — MAGNESIUM: Magnesium: 2 mg/dL (ref 1.7–2.4)

## 2021-12-21 NOTE — Progress Notes (Signed)
PROGRESS NOTE    [name]  BPZ:[id number] DOB: 06/06/1970 DOA: 12/11/2021 PCP: Martinique, Betty G, MD    Brief Narrative:   [name] is a 51 y.o. female with past medical history significant for OSA/OHS on CPAP, bipolar 1 disorder, Hx chronic systolic congestive heart failure/cardiomyopathy who presented to [facility] [place] ED on 11/13 with abdominal pain.  In the ED, CT abdomen/pelvis notable for free air and fluid.  General surgery was consulted and patient was taken emergently to the operating room with finding of a well-formed abscess cavity in the lower mid abdomen near the sigmoid colon with purulent peritonitis and fibrinous exudate suspected secondary to perforated sigmoid diverticulitis.  Patient had laparoscopic peritoneal lavage with drains placed and patient was transferred to the intensive care unit postoperatively as she remained intubated but was successfully extubated the following day.  Given continued purulence from drain output, repeat CT abdomen/pelvis was performed on 11/20 with findings of multiple fluid and gas collections; IR was consulted on 11/21 for repeat drain placement.  Assessment & Plan:   Abdominal abscess/peritonitis 2/2 presumed perforated sigmoid diverticulitis Patient presenting to ED with abdominal pain, CT abdomen/pelvis notable for free air and fluid.  General surgery was consulted and patient was taken emergently to the OR with findings of a well-formed abscess cavity lower mid abdomen near the sigmoid colon with purulent peritonitis and fibrinous exudate suspected secondary to a perforated sigmoid diverticulitis; and underwent laparoscopic peritoneal lavage with drain placement.  Repeat CT abdomen/pelvis 7/20 with multiple air-fluid levels and general surgery consulted IR for further drain placement under IR guidance. -- General surgery following, appreciate assistance -- WBC 5.6>>18.8>>12.5>13.3>12.9>12.3>11.6 -- Blood cultures 11/13: + Bacteroides  thetaiotaomicron -- Operative culture 11/13: E. coli, abundant Bacteroides thetaiotaomicron -- Culture from IR drain placement 11/21: Moderate GPR's, few GNR's, rare budding yeast -- Zosyn 3.375 g IV every 8 hours -- Micafungin 150 mg IV every 24 hours -- Currently general surgery continues to recommend no further operative management this time and recommend drainage for now and monitoring, IV antibiotics -- Continue to monitor drain output closely (currently 7 drains; 3 placed by CCS and 4 placed by IR). -- CBC daily  OSA/OHS Follows with pulmonology outpatient, Dr. Elsworth Soho. -- Continue nocturnal CPAP  Hx chronic systolic/diastolic congestive heart failure/cardiomyopathy Follows with cardiology outpatient, Dr. Percival Spanish.  Most recent echocardiogram February 2022 with now improved LVEF of 60 to 24%, grade 1 diastolic dysfunction --Continue to hold home Lasix  Hx seizures, bipolar disorder, depression --Continue alprazolam '1mg'$  PO TID, Lyrica '300mg'$  PO BID, Vraylar '6mg'$  PO qHS  Acute renal failure Etiology likely secondary to infection versus dehydration from abdominal abscess/peritonitis as above.  Home Lasix on hold, supported with IV fluids with now normalization of creatinine, 0.79. --Continue intermittent monitoring of renal function  GERD: --Protonix 40 mg p.o. twice daily  Morbid obesity Body mass index is 52.57 kg/m.  Discussed with patient needs for aggressive lifestyle changes/weight loss as this complicates all facets of care.  Outpatient follow-up with PCP.  May benefit from bariatric evaluation outpatient.   DVT prophylaxis: heparin injection 5,000 Units Start: 12/12/21 0600    Code Status: Full Code Family Communication: None present at bedside this morning  Disposition Plan:  Level of care: Med-Surg Status is: Inpatient Remains inpatient appropriate because: IV antibiotics    Consultants:  General surgery Interventional radiology  Procedures:  Laparoscopic  peritoneal lavage with drain placement x 3, Dr. Thermon Leyland; 11/13 CT guided drain placement x 4; IR, 11/21, Dr.  Watts  Antimicrobials:  Zosyn 11/13>> Micafungin 11/22>> Metronidazole 11/13 - 11/13   Subjective: Patient seen and examined bedside, resting comfortably.  Lying in bed.  No specific complaints this morning.  Continues with feculent/purulent drainage from multiple drain sites.  Seen by general surgery this morning.  Continues on IV antibiotics.  If no improvement may need laparotomy and ostomy.  Patient denies headache, no fever/chills/night sweats, no nausea/vomiting, no diarrhea, no chest pain, no palpitations, no shortness of breath, no abdominal pain, no cough/congestion.  No acute events overnight per nursing staff.  Objective: Vitals:   12/20/21 2353 12/21/21 0253 12/21/21 0623 12/21/21 0844  BP: (!) 150/84 136/80 (!) 158/93 (!) 161/86  Pulse: 83 (!) 107 96 85  Resp: '16 16 16 18  '$ Temp: 97.9 F (36.6 C) 98.1 F (36.7 C) 98.5 F (36.9 C) 98.2 F (36.8 C)  TempSrc: Oral Oral Oral   SpO2: 93% 91% 98% 97%  Weight:      Height:        Intake/Output Summary (Last 24 hours) at 12/21/2021 1242 Last data filed at 12/21/2021 0900 Gross per 24 hour  Intake 990.05 ml  Output 170 ml  Net 820.05 ml   Filed Weights   12/11/21 2114 12/18/21 0432  Weight: (!) 138.6 kg (!) 143.3 kg    Examination:  Physical Exam: GEN: NAD, alert and oriented x 3, obese; appears older than stated age, chronically ill in appearance HEENT: NCAT, PERRL, EOMI, sclera clear, MMM PULM: CTAB w/o wheezes/crackles, normal respiratory effort, on room air CV: RRR w/o M/G/R GI: abd soft, mild lower abdominal pain on palpation, NABS, no R/G/M; abdominal drains x 7 noted MSK: no peripheral edema, moves all extremities independently NEURO: CN II-XII intact, no focal deficits, sensation to light touch intact PSYCH: normal mood/affect Integumentary: Abdominal drains/surgical incision sites noted,  otherwise no other concerning rashes/lesions/wounds noted on exposed skin surfaces.      Data Reviewed: I have personally reviewed following labs and imaging studies  CBC: Recent Labs  Lab 12/18/21 0325 12/19/21 0511 12/20/21 0416 12/20/21 2333 12/21/21 0752  WBC 13.3* 12.9* 12.3* 12.1* 11.6*  HGB 12.6 12.3 11.6* 11.8* 12.1  HCT 39.2 37.7 35.8* 37.3 36.6  MCV 94.7 93.8 93.7 94.0 92.7  PLT 385 364 556* 596* 062*   Basic Metabolic Panel: Recent Labs  Lab 12/15/21 0740 12/16/21 0908 12/18/21 0325 12/19/21 0511 12/20/21 0416 12/20/21 2333  NA 138 141 141 142 141 139  K 3.9 3.9 4.2 4.4 3.7 4.2  CL 100 101 107 103 101 101  CO2 26 27 19* '26 29 27  '$ GLUCOSE 115* 134* 134* 153* 129* 137*  BUN '16 11 9 7 8 8  '$ CREATININE 1.03* 0.92 1.01* 0.77 0.79 0.77  CALCIUM 8.2* 8.2* 8.2* 8.7* 8.7* 8.6*  MG 2.2 2.1 2.1  --  2.2 2.0   GFR: Estimated Creatinine Clearance: 118.8 mL/min (by C-G formula based on SCr of 0.77 mg/dL). Liver Function Tests: No results for input(s): "AST", "ALT", "ALKPHOS", "BILITOT", "PROT", "ALBUMIN" in the last 168 hours. No results for input(s): "LIPASE", "AMYLASE" in the last 168 hours. No results for input(s): "AMMONIA" in the last 168 hours. Coagulation Profile: No results for input(s): "INR", "PROTIME" in the last 168 hours. Cardiac Enzymes: No results for input(s): "CKTOTAL", "CKMB", "CKMBINDEX", "TROPONINI" in the last 168 hours. BNP (last 3 results) No results for input(s): "PROBNP" in the last 8760 hours. HbA1C: No results for input(s): "HGBA1C" in the last 72 hours. CBG: Recent Labs  Lab 12/21/21 0000 12/21/21 0427 12/21/21 0617 12/21/21 0751 12/21/21 1142  GLUCAP 125* 123* 117* 119* 119*   Lipid Profile: No results for input(s): "CHOL", "HDL", "LDLCALC", "TRIG", "CHOLHDL", "LDLDIRECT" in the last 72 hours. Thyroid Function Tests: No results for input(s): "TSH", "T4TOTAL", "FREET4", "T3FREE", "THYROIDAB" in the last 72 hours. Anemia  Panel: No results for input(s): "VITAMINB12", "FOLATE", "FERRITIN", "TIBC", "IRON", "RETICCTPCT" in the last 72 hours. Sepsis Labs: No results for input(s): "PROCALCITON", "LATICACIDVEN" in the last 168 hours.  Recent Results (from the past 240 hour(s))  Blood culture (routine x 2)     Status: Abnormal   Collection Time: 12/11/21  4:58 PM   Specimen: BLOOD LEFT HAND  Result Value Ref Range Status   Specimen Description BLOOD LEFT HAND  Final   Special Requests   Final    BOTTLES DRAWN AEROBIC AND ANAEROBIC Blood Culture adequate volume   Culture  Setup Time   Final    GRAM NEGATIVE RODS ANAEROBIC BOTTLE ONLY CRITICAL VALUE NOTED.  VALUE IS CONSISTENT WITH PREVIOUSLY REPORTED AND CALLED VALUE.    Culture (A)  Final    BACTEROIDES THETAIOTAOMICRON BETA LACTAMASE POSITIVE Performed at [facility], [place] [address], [place], [place] [address]    Report Status 12/17/2021 FINAL  Final  Blood culture (routine x 2)     Status: Abnormal   Collection Time: 12/11/21  5:39 PM   Specimen: BLOOD  Result Value Ref Range Status   Specimen Description BLOOD SITE NOT SPECIFIED  Final   Special Requests   Final    BOTTLES DRAWN AEROBIC AND ANAEROBIC Blood Culture results may not be optimal due to an inadequate volume of blood received in culture bottles   Culture  Setup Time   Final    GRAM NEGATIVE RODS ANAEROBIC BOTTLE ONLY CRITICAL RESULT CALLED TO, READ BACK BY AND VERIFIED WITH: PHARMD ABBOTT 12/15/21 @ 0153 BY AB    Culture (A)  Final    BACTEROIDES THETAIOTAOMICRON BETA LACTAMASE POSITIVE Performed at [facility], [place] [address], [place], [place] [address]    Report Status 12/16/2021 FINAL  Final  Fungus Culture With Stain     Status: None (Preliminary result)   Collection Time: 12/11/21  9:03 PM   Specimen: PATH Cytology Peritoneal fluid; Body Fluid  Result Value Ref Range Status   Fungus Stain Final report  Final    Comment: (NOTE) Performed At: [facility], [place] [id number] Rush Farmer MD MH:[id number]    Fungus (Mycology) Culture PENDING  Incomplete   Fungal Source FLUID  Final    Comment: PERITONEAL Performed at [facility], [place] [address] [place], [place] [address]   Aerobic/Anaerobic Culture w Gram Stain (surgical/deep wound)     Status: None   Collection Time: 12/11/21  9:03 PM   Specimen: PATH Cytology Peritoneal fluid; Body Fluid  Result Value Ref Range Status   Specimen Description FLUID PERITONEAL  Final   Special Requests NONE  Final   Gram Stain   Final    FEW WBC PRESENT,BOTH PMN AND MONONUCLEAR FEW GRAM VARIABLE ROD    Culture   Final    FEW ESCHERICHIA COLI ABUNDANT BACTEROIDES THETAIOTAOMICRON BETA LACTAMASE NEGATIVE Performed at [facility], [place] [address], [place],  [address]    Report Status 12/14/2021 FINAL  Final   Organism ID, Bacteria ESCHERICHIA COLI  Final      Susceptibility   Escherichia coli - MIC*  AMPICILLIN >=32 RESISTANT Resistant     CEFAZOLIN <=4 SENSITIVE Sensitive     CEFEPIME <=0.12 SENSITIVE Sensitive     CEFTAZIDIME <=1 SENSITIVE Sensitive     CEFTRIAXONE <=0.25 SENSITIVE Sensitive     CIPROFLOXACIN <=0.25 SENSITIVE Sensitive     GENTAMICIN <=1 SENSITIVE Sensitive     IMIPENEM <=0.25 SENSITIVE Sensitive     TRIMETH/SULFA <=20 SENSITIVE Sensitive     AMPICILLIN/SULBACTAM 4 SENSITIVE Sensitive     PIP/TAZO <=4 SENSITIVE Sensitive     * FEW ESCHERICHIA COLI  Fungus Culture Result     Status: None   Collection Time: 12/11/21  9:03 PM  Result Value Ref Range Status   Result 1 Comment  Final    Comment: (NOTE) KOH/Calcofluor preparation:  no fungus observed. Performed At: [facility] [place], [place] [id number] Rush Farmer MD EZ:[id number]   MRSA Next Gen by PCR, Nasal     Status: None   Collection Time: 12/11/21 11:37 PM   Specimen: Nasal Mucosa; Nasal Swab  Result Value Ref Range Status   MRSA  by PCR Next Gen NOT DETECTED NOT DETECTED Final    Comment: (NOTE) The GeneXpert MRSA Assay (FDA approved for NASAL specimens only), is one component of a comprehensive MRSA colonization surveillance program. It is not intended to diagnose MRSA infection nor to guide or monitor treatment for MRSA infections. Test performance is not FDA approved in patients less than 35 years old. Performed at [facility], [place] [address] [place], [place] [address]   Aerobic/Anaerobic Culture w Gram Stain (surgical/deep wound)     Status: None (Preliminary result)   Collection Time: 12/19/21  4:58 PM   Specimen: Wound; Abscess  Result Value Ref Range Status   Specimen Description WOUND  Final   Special Requests POST CT GUIDED DRAIN PLACEMENT  Final   Gram Stain   Final    FEW WBC PRESENT, PREDOMINANTLY PMN MODERATE GRAM POSITIVE RODS FEW GRAM NEGATIVE RODS RARE BUDDING YEAST SEEN    Culture   Final    CULTURE REINCUBATED FOR BETTER GROWTH Performed at [facility], [place] [address], [place], [place] [address]    Report Status PENDING  Incomplete         Radiology Studies: CT GUIDED PERITONEAL/RETROPERITONEAL FLUID DRAIN BY PERC CATH  Result Date: 12/20/2021 INDICATION: History of perforated abdominal viscus, post laparotomy on 12/11/2021 (Dr. Thermon Leyland) with postprocedural CT scan demonstrated development intraluminal fluid collections. As such, patient presents now for CT-guided aspiration and/or percutaneous drainage catheter placement. EXAM: CT-GUIDED PERCUTANEOUS CATHETER PLACEMENT X4 COMPARISON:  CT abdomen and pelvis-12/18/2021; 12/11/2021 MEDICATIONS: Zofran 4 mg IV; the patient is currently admitted to the hospital and receiving intravenous antibiotics. The antibiotics were administered within an appropriate time frame prior to the initiation of the procedure. ANESTHESIA/SEDATION: Moderate (conscious) sedation was employed during this procedure. A total of Versed 2 mg and  Fentanyl 200 mcg was administered intravenously. Moderate Sedation Time: 70 minutes. The patient's level of consciousness and vital signs were monitored continuously by radiology nursing throughout the procedure under my direct supervision. CONTRAST:  None COMPLICATIONS: None immediate. PROCEDURE: RADIATION DOSE REDUCTION: This exam was performed according to the departmental dose-optimization program which includes automated exposure control, adjustment of the mA and/or kV according to patient size and/or use of iterative reconstruction technique. Informed written consent was obtained from the patient after a discussion of the risks, benefits and alternatives to treatment. The patient was placed supine on the CT  gantry and a pre procedural CT was performed re-demonstrating the known abscesses/fluid collections within the abdomen with dominant collection within the right upper abdominal quadrant measuring a proximally 6.8 x 3.5 cm (image 47, series 2), dominant collection within the left mid abdomen measuring 6.1 x 3.0 cm (59, series 2), dominant collection within the left lower abdomen/pelvis noted to contain enteric contrast and measuring approximately 8.5 x 5.4 cm (image 74, series 2) and dominant collection with the right lower abdominal quadrant measuring 9.8 x 6.5 cm (image 76, series 2. CT gantry table positions were marked and the procedure was planned. A timeout was performed prior to the initiation of the procedure. The abdomen was prepped and draped in the usual sterile fashion. The overlying soft tissues were anesthetized with 1% lidocaine with epinephrine. Under intermittent CT guidance, each collection was targeted with an 18 gauge trocar needle ultimately allowing coiling of a short Amplatz wire within all collections. Appropriate position was confirmed with CT imaging. Next, tracks were serially dilated allowing placement of 10 French drainage catheters into the smaller collections within the right  upper and left mid abdomen and 12 French drainage catheters into both the right and left lower abdominal/pelvic collections. At this time, approximately 300 cc of similar appearing feculent, foul-smelling fluid was aspirated from all of the collections with the majority of infection being aspirated from the 2 lower abdominal/pelvic drainage catheters, right-greater-than-left. All drainage catheters were flushed with saline and connected to gravity bags. All drainage catheters were secured at the skin entrance site within interrupted sutures. Dressings were applied. The patient tolerated the above procedures well without immediate postprocedural complication. IMPRESSION: 1. Successful placement of a 10 French drainage catheter into the right upper and left mid abdominal abscesses. 2. Successful placement of 12 French drainage catheters into right and left lower abdominal/pelvic abscesses. Enteric contrast is seen within the left lower abdominal/pelvic collection suggesting the adjacent sigmoid colon likely serves as the etiology of the patient's enteric perforation. 3. A total of 300 cc of similar appearing feculent, foul-smelling fluid was aspirated from all drainage catheters with the majority of infection being aspirated from the two lower abdominal/pelvic drainage catheters, right-greater-than-left. Electronically Signed   By: Sandi Mariscal M.D.   On: 12/20/2021 14:55   CT GUIDED PERITONEAL/RETROPERITONEAL FLUID DRAIN BY PERC CATH  Result Date: 12/20/2021 INDICATION: History of perforated abdominal viscus, post laparotomy on 12/11/2021 (Dr. Thermon Leyland) with postprocedural CT scan demonstrated development intraluminal fluid collections. As such, patient presents now for CT-guided aspiration and/or percutaneous drainage catheter placement. EXAM: CT-GUIDED PERCUTANEOUS CATHETER PLACEMENT X4 COMPARISON:  CT abdomen and pelvis-12/18/2021; 12/11/2021 MEDICATIONS: Zofran 4 mg IV; the patient is currently admitted  to the hospital and receiving intravenous antibiotics. The antibiotics were administered within an appropriate time frame prior to the initiation of the procedure. ANESTHESIA/SEDATION: Moderate (conscious) sedation was employed during this procedure. A total of Versed 2 mg and Fentanyl 200 mcg was administered intravenously. Moderate Sedation Time: 70 minutes. The patient's level of consciousness and vital signs were monitored continuously by radiology nursing throughout the procedure under my direct supervision. CONTRAST:  None COMPLICATIONS: None immediate. PROCEDURE: RADIATION DOSE REDUCTION: This exam was performed according to the departmental dose-optimization program which includes automated exposure control, adjustment of the mA and/or kV according to patient size and/or use of iterative reconstruction technique. Informed written consent was obtained from the patient after a discussion of the risks, benefits and alternatives to treatment. The patient was placed supine on the CT gantry and a  pre procedural CT was performed re-demonstrating the known abscesses/fluid collections within the abdomen with dominant collection within the right upper abdominal quadrant measuring a proximally 6.8 x 3.5 cm (image 47, series 2), dominant collection within the left mid abdomen measuring 6.1 x 3.0 cm (59, series 2), dominant collection within the left lower abdomen/pelvis noted to contain enteric contrast and measuring approximately 8.5 x 5.4 cm (image 74, series 2) and dominant collection with the right lower abdominal quadrant measuring 9.8 x 6.5 cm (image 76, series 2. CT gantry table positions were marked and the procedure was planned. A timeout was performed prior to the initiation of the procedure. The abdomen was prepped and draped in the usual sterile fashion. The overlying soft tissues were anesthetized with 1% lidocaine with epinephrine. Under intermittent CT guidance, each collection was targeted with an 18  gauge trocar needle ultimately allowing coiling of a short Amplatz wire within all collections. Appropriate position was confirmed with CT imaging. Next, tracks were serially dilated allowing placement of 10 French drainage catheters into the smaller collections within the right upper and left mid abdomen and 12 French drainage catheters into both the right and left lower abdominal/pelvic collections. At this time, approximately 300 cc of similar appearing feculent, foul-smelling fluid was aspirated from all of the collections with the majority of infection being aspirated from the 2 lower abdominal/pelvic drainage catheters, right-greater-than-left. All drainage catheters were flushed with saline and connected to gravity bags. All drainage catheters were secured at the skin entrance site within interrupted sutures. Dressings were applied. The patient tolerated the above procedures well without immediate postprocedural complication. IMPRESSION: 1. Successful placement of a 10 French drainage catheter into the right upper and left mid abdominal abscesses. 2. Successful placement of 12 French drainage catheters into right and left lower abdominal/pelvic abscesses. Enteric contrast is seen within the left lower abdominal/pelvic collection suggesting the adjacent sigmoid colon likely serves as the etiology of the patient's enteric perforation. 3. A total of 300 cc of similar appearing feculent, foul-smelling fluid was aspirated from all drainage catheters with the majority of infection being aspirated from the two lower abdominal/pelvic drainage catheters, right-greater-than-left. Electronically Signed   By: Sandi Mariscal M.D.   On: 12/20/2021 14:55   CT GUIDED PERITONEAL/RETROPERITONEAL FLUID DRAIN BY PERC CATH  Result Date: 12/20/2021 INDICATION: History of perforated abdominal viscus, post laparotomy on 12/11/2021 (Dr. Thermon Leyland) with postprocedural CT scan demonstrated development intraluminal fluid  collections. As such, patient presents now for CT-guided aspiration and/or percutaneous drainage catheter placement. EXAM: CT-GUIDED PERCUTANEOUS CATHETER PLACEMENT X4 COMPARISON:  CT abdomen and pelvis-12/18/2021; 12/11/2021 MEDICATIONS: Zofran 4 mg IV; the patient is currently admitted to the hospital and receiving intravenous antibiotics. The antibiotics were administered within an appropriate time frame prior to the initiation of the procedure. ANESTHESIA/SEDATION: Moderate (conscious) sedation was employed during this procedure. A total of Versed 2 mg and Fentanyl 200 mcg was administered intravenously. Moderate Sedation Time: 70 minutes. The patient's level of consciousness and vital signs were monitored continuously by radiology nursing throughout the procedure under my direct supervision. CONTRAST:  None COMPLICATIONS: None immediate. PROCEDURE: RADIATION DOSE REDUCTION: This exam was performed according to the departmental dose-optimization program which includes automated exposure control, adjustment of the mA and/or kV according to patient size and/or use of iterative reconstruction technique. Informed written consent was obtained from the patient after a discussion of the risks, benefits and alternatives to treatment. The patient was placed supine on the CT gantry and a pre procedural CT  was performed re-demonstrating the known abscesses/fluid collections within the abdomen with dominant collection within the right upper abdominal quadrant measuring a proximally 6.8 x 3.5 cm (image 47, series 2), dominant collection within the left mid abdomen measuring 6.1 x 3.0 cm (59, series 2), dominant collection within the left lower abdomen/pelvis noted to contain enteric contrast and measuring approximately 8.5 x 5.4 cm (image 74, series 2) and dominant collection with the right lower abdominal quadrant measuring 9.8 x 6.5 cm (image 76, series 2. CT gantry table positions were marked and the procedure was  planned. A timeout was performed prior to the initiation of the procedure. The abdomen was prepped and draped in the usual sterile fashion. The overlying soft tissues were anesthetized with 1% lidocaine with epinephrine. Under intermittent CT guidance, each collection was targeted with an 18 gauge trocar needle ultimately allowing coiling of a short Amplatz wire within all collections. Appropriate position was confirmed with CT imaging. Next, tracks were serially dilated allowing placement of 10 French drainage catheters into the smaller collections within the right upper and left mid abdomen and 12 French drainage catheters into both the right and left lower abdominal/pelvic collections. At this time, approximately 300 cc of similar appearing feculent, foul-smelling fluid was aspirated from all of the collections with the majority of infection being aspirated from the 2 lower abdominal/pelvic drainage catheters, right-greater-than-left. All drainage catheters were flushed with saline and connected to gravity bags. All drainage catheters were secured at the skin entrance site within interrupted sutures. Dressings were applied. The patient tolerated the above procedures well without immediate postprocedural complication. IMPRESSION: 1. Successful placement of a 10 French drainage catheter into the right upper and left mid abdominal abscesses. 2. Successful placement of 12 French drainage catheters into right and left lower abdominal/pelvic abscesses. Enteric contrast is seen within the left lower abdominal/pelvic collection suggesting the adjacent sigmoid colon likely serves as the etiology of the patient's enteric perforation. 3. A total of 300 cc of similar appearing feculent, foul-smelling fluid was aspirated from all drainage catheters with the majority of infection being aspirated from the two lower abdominal/pelvic drainage catheters, right-greater-than-left. Electronically Signed   By: Sandi Mariscal M.D.   On:  12/20/2021 14:55   CT GUIDED PERITONEAL/RETROPERITONEAL FLUID DRAIN BY PERC CATH  Result Date: 12/20/2021 INDICATION: History of perforated abdominal viscus, post laparotomy on 12/11/2021 (Dr. Thermon Leyland) with postprocedural CT scan demonstrated development intraluminal fluid collections. As such, patient presents now for CT-guided aspiration and/or percutaneous drainage catheter placement. EXAM: CT-GUIDED PERCUTANEOUS CATHETER PLACEMENT X4 COMPARISON:  CT abdomen and pelvis-12/18/2021; 12/11/2021 MEDICATIONS: Zofran 4 mg IV; the patient is currently admitted to the hospital and receiving intravenous antibiotics. The antibiotics were administered within an appropriate time frame prior to the initiation of the procedure. ANESTHESIA/SEDATION: Moderate (conscious) sedation was employed during this procedure. A total of Versed 2 mg and Fentanyl 200 mcg was administered intravenously. Moderate Sedation Time: 70 minutes. The patient's level of consciousness and vital signs were monitored continuously by radiology nursing throughout the procedure under my direct supervision. CONTRAST:  None COMPLICATIONS: None immediate. PROCEDURE: RADIATION DOSE REDUCTION: This exam was performed according to the departmental dose-optimization program which includes automated exposure control, adjustment of the mA and/or kV according to patient size and/or use of iterative reconstruction technique. Informed written consent was obtained from the patient after a discussion of the risks, benefits and alternatives to treatment. The patient was placed supine on the CT gantry and a pre procedural CT was performed re-demonstrating  the known abscesses/fluid collections within the abdomen with dominant collection within the right upper abdominal quadrant measuring a proximally 6.8 x 3.5 cm (image 47, series 2), dominant collection within the left mid abdomen measuring 6.1 x 3.0 cm (59, series 2), dominant collection within the left lower  abdomen/pelvis noted to contain enteric contrast and measuring approximately 8.5 x 5.4 cm (image 74, series 2) and dominant collection with the right lower abdominal quadrant measuring 9.8 x 6.5 cm (image 76, series 2. CT gantry table positions were marked and the procedure was planned. A timeout was performed prior to the initiation of the procedure. The abdomen was prepped and draped in the usual sterile fashion. The overlying soft tissues were anesthetized with 1% lidocaine with epinephrine. Under intermittent CT guidance, each collection was targeted with an 18 gauge trocar needle ultimately allowing coiling of a short Amplatz wire within all collections. Appropriate position was confirmed with CT imaging. Next, tracks were serially dilated allowing placement of 10 French drainage catheters into the smaller collections within the right upper and left mid abdomen and 12 French drainage catheters into both the right and left lower abdominal/pelvic collections. At this time, approximately 300 cc of similar appearing feculent, foul-smelling fluid was aspirated from all of the collections with the majority of infection being aspirated from the 2 lower abdominal/pelvic drainage catheters, right-greater-than-left. All drainage catheters were flushed with saline and connected to gravity bags. All drainage catheters were secured at the skin entrance site within interrupted sutures. Dressings were applied. The patient tolerated the above procedures well without immediate postprocedural complication. IMPRESSION: 1. Successful placement of a 10 French drainage catheter into the right upper and left mid abdominal abscesses. 2. Successful placement of 12 French drainage catheters into right and left lower abdominal/pelvic abscesses. Enteric contrast is seen within the left lower abdominal/pelvic collection suggesting the adjacent sigmoid colon likely serves as the etiology of the patient's enteric perforation. 3. A total of  300 cc of similar appearing feculent, foul-smelling fluid was aspirated from all drainage catheters with the majority of infection being aspirated from the two lower abdominal/pelvic drainage catheters, right-greater-than-left. Electronically Signed   By: Sandi Mariscal M.D.   On: 12/20/2021 14:55        Scheduled Meds:  acetaminophen  1,000 mg Oral Q6H   ALPRAZolam  1 mg Oral TID   amphetamine-dextroamphetamine  20 mg Oral TID   cariprazine  6 mg Oral QPM   Dextromethorphan-quiNIDine  1 capsule Oral Q12H   docusate sodium  100 mg Oral BID   heparin injection (subcutaneous)  5,000 Units Subcutaneous Q8H   insulin aspart  0-20 Units Subcutaneous Q4H   lidocaine  20 mL Intradermal Once   methocarbamol  1,000 mg Oral Q8H   mouth rinse  15 mL Mouth Rinse 4 times per day   pantoprazole  40 mg Oral BID   polyethylene glycol  17 g Oral Daily   pregabalin  300 mg Oral BID   sodium chloride flush  5 mL Intracatheter Q8H   Continuous Infusions:  micafungin (MYCAMINE) 150 mg in sodium chloride 0.9 % 100 mL IVPB 150 mg (12/21/21 1018)   piperacillin-tazobactam (ZOSYN)  IV 12.5 mL/hr at 12/21/21 0700     LOS: 10 days    Time spent: 51 minutes spent on chart review, discussion with nursing staff, consultants, updating family and interview/physical exam; more than 50% of that time was spent in counseling and/or coordination of care.    Katina Remick J [place] DO [facility] Available via Epic secure chat 7am-7pm After these hours, please refer to coverage provider listed on [email or web address] 12/21/2021, 12:42 PM

## 2021-12-21 NOTE — Plan of Care (Signed)
  Problem: Clinical Measurements: Goal: Diagnostic test results will improve Outcome: Progressing   Problem: Clinical Measurements: Goal: Respiratory complications will improve Outcome: Progressing   Problem: Nutrition: Goal: Adequate nutrition will be maintained Outcome: Progressing   Problem: Activity: Goal: Risk for activity intolerance will decrease Outcome: Progressing   Problem: Coping: Goal: Level of anxiety will decrease Outcome: Progressing   

## 2021-12-21 NOTE — Progress Notes (Signed)
Patient ID: [name], female   DOB: 1970-10-21, 51 y.o.   MRN: [id number] [facility] Surgery Progress Note:   10 Days Post-Op   THE PLAN  Continue NPO and consider TNA if nonoperative management is planned.  It seems that she has a hole in her sigmoid with continued contamination evacuated with drain.  Will discuss management with partners  Subjective: Mental status is arouseable and appropropriate.  Complaints none. Objective: Vital signs in last 24 hours: Temp:  [97.5 F (36.4 C)-98.9 F (37.2 C)] 98.2 F (36.8 C) (11/23 0844) Pulse Rate:  [83-110] 85 (11/23 0844) Resp:  [16-19] 18 (11/23 0844) BP: (121-161)/(79-99) 161/86 (11/23 0844) SpO2:  [88 %-98 %] 88 % (11/23 0844)  Intake/Output from previous day: 11/22 0701 - 11/23 0700 In: 750.1 [P.O.:480; IV Piggyback:270.1] Out: 170 [Drains:170] Intake/Output this shift: Total I/O In: 240 [P.O.:240] Out: 0   Physical Exam: Work of breathing is not labored.  Large women with proptosis and hirsutism.  Multiple drains some with enteric contents noted.    Lab Results:  Results for orders placed or performed during the hospital encounter of 12/11/21 (from the past 48 hour(s))  Glucose, capillary     Status: Abnormal   Collection Time: 12/19/21 12:20 PM  Result Value Ref Range   Glucose-Capillary 130 (H) 70 - 99 mg/dL    Comment: Glucose reference range applies only to samples taken after fasting for at least 8 hours.  Aerobic/Anaerobic Culture w Gram Stain (surgical/deep wound)     Status: None (Preliminary result)   Collection Time: 12/19/21  4:58 PM   Specimen: Wound; Abscess  Result Value Ref Range   Specimen Description WOUND    Special Requests POST CT GUIDED DRAIN PLACEMENT    Gram Stain      FEW WBC PRESENT, PREDOMINANTLY PMN MODERATE GRAM POSITIVE RODS FEW GRAM NEGATIVE RODS RARE BUDDING YEAST SEEN    Culture      CULTURE REINCUBATED FOR BETTER GROWTH Performed at [facility], [place]  [address], [place], [place] [address]    Report Status PENDING   Glucose, capillary     Status: Abnormal   Collection Time: 12/19/21  5:22 PM  Result Value Ref Range   Glucose-Capillary 120 (H) 70 - 99 mg/dL    Comment: Glucose reference range applies only to samples taken after fasting for at least 8 hours.  Glucose, capillary     Status: Abnormal   Collection Time: 12/19/21  8:22 PM  Result Value Ref Range   Glucose-Capillary 129 (H) 70 - 99 mg/dL    Comment: Glucose reference range applies only to samples taken after fasting for at least 8 hours.  Glucose, capillary     Status: Abnormal   Collection Time: 12/20/21 12:33 AM  Result Value Ref Range   Glucose-Capillary 122 (H) 70 - 99 mg/dL    Comment: Glucose reference range applies only to samples taken after fasting for at least 8 hours.  Glucose, capillary     Status: Abnormal   Collection Time: 12/20/21  4:03 AM  Result Value Ref Range   Glucose-Capillary 119 (H) 70 - 99 mg/dL    Comment: Glucose reference range applies only to samples taken after fasting for at least 8 hours.  Basic metabolic panel     Status: Abnormal   Collection Time: 12/20/21  4:16 AM  Result Value Ref Range   Sodium 141 135 - 145 mmol/L   Potassium 3.7 3.5 - 5.1 mmol/L  Chloride 101 98 - 111 mmol/L   CO2 29 22 - 32 mmol/L   Glucose, Bld 129 (H) 70 - 99 mg/dL    Comment: Glucose reference range applies only to samples taken after fasting for at least 8 hours.   BUN 8 6 - 20 mg/dL   Creatinine, Ser 0.79 0.44 - 1.00 mg/dL   Calcium 8.7 (L) 8.9 - 10.3 mg/dL   GFR, Estimated >60 >60 mL/min    Comment: (NOTE) Calculated using the CKD-EPI Creatinine Equation (2021)    Anion gap 11 5 - 15    Comment: Performed at [facility] [address] [place], [place] [address]  CBC     Status: Abnormal   Collection Time: 12/20/21  4:16 AM  Result Value Ref Range   WBC 12.3 (H) 4.0 - 10.5 K/uL   RBC 3.82 (L) 3.87 - 5.11 MIL/uL   Hemoglobin 11.6 (L) 12.0 - 15.0  g/dL   HCT 35.8 (L) 36.0 - 46.0 %   MCV 93.7 80.0 - 100.0 fL   MCH 30.4 26.0 - 34.0 pg   MCHC 32.4 30.0 - 36.0 g/dL   RDW 15.0 11.5 - 15.5 %   Platelets 556 (H) 150 - 400 K/uL   nRBC 0.0 0.0 - 0.2 %    Comment: Performed at [facility], [place] [address] [place], [place] [address]  Magnesium     Status: None   Collection Time: 12/20/21  4:16 AM  Result Value Ref Range   Magnesium 2.2 1.7 - 2.4 mg/dL    Comment: Performed at [facility] [address] [place], [place] [address]  Glucose, capillary     Status: Abnormal   Collection Time: 12/20/21  7:35 AM  Result Value Ref Range   Glucose-Capillary 116 (H) 70 - 99 mg/dL    Comment: Glucose reference range applies only to samples taken after fasting for at least 8 hours.  Glucose, capillary     Status: Abnormal   Collection Time: 12/20/21 11:41 AM  Result Value Ref Range   Glucose-Capillary 109 (H) 70 - 99 mg/dL    Comment: Glucose reference range applies only to samples taken after fasting for at least 8 hours.  Glucose, capillary     Status: Abnormal   Collection Time: 12/20/21  4:48 PM  Result Value Ref Range   Glucose-Capillary 105 (H) 70 - 99 mg/dL    Comment: Glucose reference range applies only to samples taken after fasting for at least 8 hours.  Glucose, capillary     Status: Abnormal   Collection Time: 12/20/21  8:53 PM  Result Value Ref Range   Glucose-Capillary 136 (H) 70 - 99 mg/dL    Comment: Glucose reference range applies only to samples taken after fasting for at least 8 hours.  CBC     Status: Abnormal   Collection Time: 12/20/21 11:33 PM  Result Value Ref Range   WBC 12.1 (H) 4.0 - 10.5 K/uL   RBC 3.97 3.87 - 5.11 MIL/uL   Hemoglobin 11.8 (L) 12.0 - 15.0 g/dL   HCT 37.3 36.0 - 46.0 %   MCV 94.0 80.0 - 100.0 fL   MCH 29.7 26.0 - 34.0 pg   MCHC 31.6 30.0 - 36.0 g/dL   RDW 14.8 11.5 - 15.5 %   Platelets 596 (H) 150 - 400 K/uL   nRBC 0.0 0.0 - 0.2 %    Comment: Performed at [facility], [place]  [address], [place], [place] [address]  Basic metabolic panel     Status: Abnormal   Collection Time: 12/20/21 11:33 PM  Result Value Ref Range   Sodium 139 135 - 145 mmol/L   Potassium 4.2 3.5 - 5.1 mmol/L   Chloride 101 98 - 111 mmol/L   CO2 27 22 - 32 mmol/L   Glucose, Bld 137 (H) 70 - 99 mg/dL    Comment: Glucose reference range applies only to samples taken after fasting for at least 8 hours.   BUN 8 6 - 20 mg/dL   Creatinine, Ser 0.77 0.44 - 1.00 mg/dL   Calcium 8.6 (L) 8.9 - 10.3 mg/dL   GFR, Estimated >60 >60 mL/min    Comment: (NOTE) Calculated using the CKD-EPI Creatinine Equation (2021)    Anion gap 11 5 - 15    Comment: Performed at [facility] [address] [place], [place] [address]  Magnesium     Status: None   Collection Time: 12/20/21 11:33 PM  Result Value Ref Range   Magnesium 2.0 1.7 - 2.4 mg/dL    Comment: Performed at [facility], [place] [address] [place], [place] [address]  Blood gas, venous     Status: Abnormal   Collection Time: 12/20/21 11:33 PM  Result Value Ref Range   pH, Ven 7.37 7.25 - 7.43   pCO2, Ven 59 44 - 60 mmHg   pO2, Ven 48 (H) 32 - 45 mmHg   Bicarbonate 34.7 (H) 20.0 - 28.0 mmol/L   Acid-Base Excess 7.4 (H) 0.0 - 2.0 mmol/L   O2 Saturation 80.4 %   Patient temperature 36.8     Comment: Performed at [facility] [address], [place], [place] [address]  Glucose, capillary     Status: Abnormal   Collection Time: 12/21/21 12:00 AM  Result Value Ref Range   Glucose-Capillary 125 (H) 70 - 99 mg/dL    Comment: Glucose reference range applies only to samples taken after fasting for at least 8 hours.  Glucose, capillary     Status: Abnormal   Collection Time: 12/21/21  4:27 AM  Result Value Ref Range   Glucose-Capillary 123 (H) 70 - 99 mg/dL    Comment: Glucose reference range applies only to samples taken after fasting for at least 8 hours.  Glucose, capillary     Status: Abnormal   Collection Time: 12/21/21   6:17 AM  Result Value Ref Range   Glucose-Capillary 117 (H) 70 - 99 mg/dL    Comment: Glucose reference range applies only to samples taken after fasting for at least 8 hours.  Glucose, capillary     Status: Abnormal   Collection Time: 12/21/21  7:51 AM  Result Value Ref Range   Glucose-Capillary 119 (H) 70 - 99 mg/dL    Comment: Glucose reference range applies only to samples taken after fasting for at least 8 hours.  CBC     Status: Abnormal   Collection Time: 12/21/21  7:52 AM  Result Value Ref Range   WBC 11.6 (H) 4.0 - 10.5 K/uL   RBC 3.95 3.87 - 5.11 MIL/uL   Hemoglobin 12.1 12.0 - 15.0 g/dL   HCT 36.6 36.0 - 46.0 %   MCV 92.7 80.0 - 100.0 fL   MCH 30.6 26.0 - 34.0 pg   MCHC 33.1 30.0 - 36.0 g/dL   RDW 15.0 11.5 - 15.5 %   Platelets 550 (H) 150 - 400 K/uL   nRBC 0.0 0.0 -  0.2 %    Comment: Performed at [facility], [place] [address] [place], [place] [address]    Radiology/Results: CT GUIDED PERITONEAL/RETROPERITONEAL FLUID DRAIN BY [facility] CATH  Result Date: 12/20/2021 INDICATION: History of perforated abdominal viscus, post laparotomy on 12/11/2021 (Dr. Thermon Leyland) with postprocedural CT scan demonstrated development intraluminal fluid collections. As such, patient presents now for CT-guided aspiration and/or percutaneous drainage catheter placement. EXAM: CT-GUIDED PERCUTANEOUS CATHETER PLACEMENT X4 COMPARISON:  CT abdomen and pelvis-12/18/2021; 12/11/2021 MEDICATIONS: Zofran 4 mg IV; the patient is currently admitted to the hospital and receiving intravenous antibiotics. The antibiotics were administered within an appropriate time frame prior to the initiation of the procedure. ANESTHESIA/SEDATION: Moderate (conscious) sedation was employed during this procedure. A total of Versed 2 mg and Fentanyl 200 mcg was administered intravenously. Moderate Sedation Time: 70 minutes. The patient's level of consciousness and vital signs were monitored continuously by radiology nursing  throughout the procedure under my direct supervision. CONTRAST:  None COMPLICATIONS: None immediate. PROCEDURE: RADIATION DOSE REDUCTION: This exam was performed according to the departmental dose-optimization program which includes automated exposure control, adjustment of the mA and/or kV according to patient size and/or use of iterative reconstruction technique. Informed written consent was obtained from the patient after a discussion of the risks, benefits and alternatives to treatment. The patient was placed supine on the CT gantry and a pre procedural CT was performed re-demonstrating the known abscesses/fluid collections within the abdomen with dominant collection within the right upper abdominal quadrant measuring a proximally 6.8 x 3.5 cm (image 47, series 2), dominant collection within the left mid abdomen measuring 6.1 x 3.0 cm (59, series 2), dominant collection within the left lower abdomen/pelvis noted to contain enteric contrast and measuring approximately 8.5 x 5.4 cm (image 74, series 2) and dominant collection with the right lower abdominal quadrant measuring 9.8 x 6.5 cm (image 76, series 2. CT gantry table positions were marked and the procedure was planned. A timeout was performed prior to the initiation of the procedure. The abdomen was prepped and draped in the usual sterile fashion. The overlying soft tissues were anesthetized with 1% lidocaine with epinephrine. Under intermittent CT guidance, each collection was targeted with an 18 gauge trocar needle ultimately allowing coiling of a short Amplatz wire within all collections. Appropriate position was confirmed with CT imaging. Next, tracks were serially dilated allowing placement of 10 French drainage catheters into the smaller collections within the right upper and left mid abdomen and 12 French drainage catheters into both the right and left lower abdominal/pelvic collections. At this time, approximately 300 cc of similar appearing  feculent, foul-smelling fluid was aspirated from all of the collections with the majority of infection being aspirated from the 2 lower abdominal/pelvic drainage catheters, right-greater-than-left. All drainage catheters were flushed with saline and connected to gravity bags. All drainage catheters were secured at the skin entrance site within interrupted sutures. Dressings were applied. The patient tolerated the above procedures well without immediate postprocedural complication. IMPRESSION: 1. Successful placement of a 10 French drainage catheter into the right upper and left mid abdominal abscesses. 2. Successful placement of 12 French drainage catheters into right and left lower abdominal/pelvic abscesses. Enteric contrast is seen within the left lower abdominal/pelvic collection suggesting the adjacent sigmoid colon likely serves as the etiology of the patient's enteric perforation. 3. A total of 300 cc of similar appearing feculent, foul-smelling fluid was aspirated from all drainage catheters with the majority of infection being aspirated from the two lower  abdominal/pelvic drainage catheters, right-greater-than-left. Electronically Signed   By: Sandi Mariscal M.D.   On: 12/20/2021 14:55   CT GUIDED PERITONEAL/RETROPERITONEAL FLUID DRAIN BY [facility] CATH  Result Date: 12/20/2021 INDICATION: History of perforated abdominal viscus, post laparotomy on 12/11/2021 (Dr. Thermon Leyland) with postprocedural CT scan demonstrated development intraluminal fluid collections. As such, patient presents now for CT-guided aspiration and/or percutaneous drainage catheter placement. EXAM: CT-GUIDED PERCUTANEOUS CATHETER PLACEMENT X4 COMPARISON:  CT abdomen and pelvis-12/18/2021; 12/11/2021 MEDICATIONS: Zofran 4 mg IV; the patient is currently admitted to the hospital and receiving intravenous antibiotics. The antibiotics were administered within an appropriate time frame prior to the initiation of the procedure.  ANESTHESIA/SEDATION: Moderate (conscious) sedation was employed during this procedure. A total of Versed 2 mg and Fentanyl 200 mcg was administered intravenously. Moderate Sedation Time: 70 minutes. The patient's level of consciousness and vital signs were monitored continuously by radiology nursing throughout the procedure under my direct supervision. CONTRAST:  None COMPLICATIONS: None immediate. PROCEDURE: RADIATION DOSE REDUCTION: This exam was performed according to the departmental dose-optimization program which includes automated exposure control, adjustment of the mA and/or kV according to patient size and/or use of iterative reconstruction technique. Informed written consent was obtained from the patient after a discussion of the risks, benefits and alternatives to treatment. The patient was placed supine on the CT gantry and a pre procedural CT was performed re-demonstrating the known abscesses/fluid collections within the abdomen with dominant collection within the right upper abdominal quadrant measuring a proximally 6.8 x 3.5 cm (image 47, series 2), dominant collection within the left mid abdomen measuring 6.1 x 3.0 cm (59, series 2), dominant collection within the left lower abdomen/pelvis noted to contain enteric contrast and measuring approximately 8.5 x 5.4 cm (image 74, series 2) and dominant collection with the right lower abdominal quadrant measuring 9.8 x 6.5 cm (image 76, series 2. CT gantry table positions were marked and the procedure was planned. A timeout was performed prior to the initiation of the procedure. The abdomen was prepped and draped in the usual sterile fashion. The overlying soft tissues were anesthetized with 1% lidocaine with epinephrine. Under intermittent CT guidance, each collection was targeted with an 18 gauge trocar needle ultimately allowing coiling of a short Amplatz wire within all collections. Appropriate position was confirmed with CT imaging. Next, tracks were  serially dilated allowing placement of 10 French drainage catheters into the smaller collections within the right upper and left mid abdomen and 12 French drainage catheters into both the right and left lower abdominal/pelvic collections. At this time, approximately 300 cc of similar appearing feculent, foul-smelling fluid was aspirated from all of the collections with the majority of infection being aspirated from the 2 lower abdominal/pelvic drainage catheters, right-greater-than-left. All drainage catheters were flushed with saline and connected to gravity bags. All drainage catheters were secured at the skin entrance site within interrupted sutures. Dressings were applied. The patient tolerated the above procedures well without immediate postprocedural complication. IMPRESSION: 1. Successful placement of a 10 French drainage catheter into the right upper and left mid abdominal abscesses. 2. Successful placement of 12 French drainage catheters into right and left lower abdominal/pelvic abscesses. Enteric contrast is seen within the left lower abdominal/pelvic collection suggesting the adjacent sigmoid colon likely serves as the etiology of the patient's enteric perforation. 3. A total of 300 cc of similar appearing feculent, foul-smelling fluid was aspirated from all drainage catheters with the majority of infection being aspirated from the two lower abdominal/pelvic drainage catheters,  right-greater-than-left. Electronically Signed   By: Sandi Mariscal M.D.   On: 12/20/2021 14:55   CT GUIDED PERITONEAL/RETROPERITONEAL FLUID DRAIN BY [facility] CATH  Result Date: 12/20/2021 INDICATION: History of perforated abdominal viscus, post laparotomy on 12/11/2021 (Dr. Thermon Leyland) with postprocedural CT scan demonstrated development intraluminal fluid collections. As such, patient presents now for CT-guided aspiration and/or percutaneous drainage catheter placement. EXAM: CT-GUIDED PERCUTANEOUS CATHETER PLACEMENT X4  COMPARISON:  CT abdomen and pelvis-12/18/2021; 12/11/2021 MEDICATIONS: Zofran 4 mg IV; the patient is currently admitted to the hospital and receiving intravenous antibiotics. The antibiotics were administered within an appropriate time frame prior to the initiation of the procedure. ANESTHESIA/SEDATION: Moderate (conscious) sedation was employed during this procedure. A total of Versed 2 mg and Fentanyl 200 mcg was administered intravenously. Moderate Sedation Time: 70 minutes. The patient's level of consciousness and vital signs were monitored continuously by radiology nursing throughout the procedure under my direct supervision. CONTRAST:  None COMPLICATIONS: None immediate. PROCEDURE: RADIATION DOSE REDUCTION: This exam was performed according to the departmental dose-optimization program which includes automated exposure control, adjustment of the mA and/or kV according to patient size and/or use of iterative reconstruction technique. Informed written consent was obtained from the patient after a discussion of the risks, benefits and alternatives to treatment. The patient was placed supine on the CT gantry and a pre procedural CT was performed re-demonstrating the known abscesses/fluid collections within the abdomen with dominant collection within the right upper abdominal quadrant measuring a proximally 6.8 x 3.5 cm (image 47, series 2), dominant collection within the left mid abdomen measuring 6.1 x 3.0 cm (59, series 2), dominant collection within the left lower abdomen/pelvis noted to contain enteric contrast and measuring approximately 8.5 x 5.4 cm (image 74, series 2) and dominant collection with the right lower abdominal quadrant measuring 9.8 x 6.5 cm (image 76, series 2. CT gantry table positions were marked and the procedure was planned. A timeout was performed prior to the initiation of the procedure. The abdomen was prepped and draped in the usual sterile fashion. The overlying soft tissues were  anesthetized with 1% lidocaine with epinephrine. Under intermittent CT guidance, each collection was targeted with an 18 gauge trocar needle ultimately allowing coiling of a short Amplatz wire within all collections. Appropriate position was confirmed with CT imaging. Next, tracks were serially dilated allowing placement of 10 French drainage catheters into the smaller collections within the right upper and left mid abdomen and 12 French drainage catheters into both the right and left lower abdominal/pelvic collections. At this time, approximately 300 cc of similar appearing feculent, foul-smelling fluid was aspirated from all of the collections with the majority of infection being aspirated from the 2 lower abdominal/pelvic drainage catheters, right-greater-than-left. All drainage catheters were flushed with saline and connected to gravity bags. All drainage catheters were secured at the skin entrance site within interrupted sutures. Dressings were applied. The patient tolerated the above procedures well without immediate postprocedural complication. IMPRESSION: 1. Successful placement of a 10 French drainage catheter into the right upper and left mid abdominal abscesses. 2. Successful placement of 12 French drainage catheters into right and left lower abdominal/pelvic abscesses. Enteric contrast is seen within the left lower abdominal/pelvic collection suggesting the adjacent sigmoid colon likely serves as the etiology of the patient's enteric perforation. 3. A total of 300 cc of similar appearing feculent, foul-smelling fluid was aspirated from all drainage catheters with the majority of infection being aspirated from the two lower abdominal/pelvic drainage catheters, right-greater-than-left. Electronically Signed  By: Sandi Mariscal M.D.   On: 12/20/2021 14:55   CT GUIDED PERITONEAL/RETROPERITONEAL FLUID DRAIN BY [facility] CATH  Result Date: 12/20/2021 INDICATION: History of perforated abdominal viscus, post  laparotomy on 12/11/2021 (Dr. Thermon Leyland) with postprocedural CT scan demonstrated development intraluminal fluid collections. As such, patient presents now for CT-guided aspiration and/or percutaneous drainage catheter placement. EXAM: CT-GUIDED PERCUTANEOUS CATHETER PLACEMENT X4 COMPARISON:  CT abdomen and pelvis-12/18/2021; 12/11/2021 MEDICATIONS: Zofran 4 mg IV; the patient is currently admitted to the hospital and receiving intravenous antibiotics. The antibiotics were administered within an appropriate time frame prior to the initiation of the procedure. ANESTHESIA/SEDATION: Moderate (conscious) sedation was employed during this procedure. A total of Versed 2 mg and Fentanyl 200 mcg was administered intravenously. Moderate Sedation Time: 70 minutes. The patient's level of consciousness and vital signs were monitored continuously by radiology nursing throughout the procedure under my direct supervision. CONTRAST:  None COMPLICATIONS: None immediate. PROCEDURE: RADIATION DOSE REDUCTION: This exam was performed according to the departmental dose-optimization program which includes automated exposure control, adjustment of the mA and/or kV according to patient size and/or use of iterative reconstruction technique. Informed written consent was obtained from the patient after a discussion of the risks, benefits and alternatives to treatment. The patient was placed supine on the CT gantry and a pre procedural CT was performed re-demonstrating the known abscesses/fluid collections within the abdomen with dominant collection within the right upper abdominal quadrant measuring a proximally 6.8 x 3.5 cm (image 47, series 2), dominant collection within the left mid abdomen measuring 6.1 x 3.0 cm (59, series 2), dominant collection within the left lower abdomen/pelvis noted to contain enteric contrast and measuring approximately 8.5 x 5.4 cm (image 74, series 2) and dominant collection with the right lower abdominal  quadrant measuring 9.8 x 6.5 cm (image 76, series 2. CT gantry table positions were marked and the procedure was planned. A timeout was performed prior to the initiation of the procedure. The abdomen was prepped and draped in the usual sterile fashion. The overlying soft tissues were anesthetized with 1% lidocaine with epinephrine. Under intermittent CT guidance, each collection was targeted with an 18 gauge trocar needle ultimately allowing coiling of a short Amplatz wire within all collections. Appropriate position was confirmed with CT imaging. Next, tracks were serially dilated allowing placement of 10 French drainage catheters into the smaller collections within the right upper and left mid abdomen and 12 French drainage catheters into both the right and left lower abdominal/pelvic collections. At this time, approximately 300 cc of similar appearing feculent, foul-smelling fluid was aspirated from all of the collections with the majority of infection being aspirated from the 2 lower abdominal/pelvic drainage catheters, right-greater-than-left. All drainage catheters were flushed with saline and connected to gravity bags. All drainage catheters were secured at the skin entrance site within interrupted sutures. Dressings were applied. The patient tolerated the above procedures well without immediate postprocedural complication. IMPRESSION: 1. Successful placement of a 10 French drainage catheter into the right upper and left mid abdominal abscesses. 2. Successful placement of 12 French drainage catheters into right and left lower abdominal/pelvic abscesses. Enteric contrast is seen within the left lower abdominal/pelvic collection suggesting the adjacent sigmoid colon likely serves as the etiology of the patient's enteric perforation. 3. A total of 300 cc of similar appearing feculent, foul-smelling fluid was aspirated from all drainage catheters with the majority of infection being aspirated from the two lower  abdominal/pelvic drainage catheters, right-greater-than-left. Electronically Signed   By: Jenny Reichmann  Watts M.D.   On: 12/20/2021 14:55    Anti-infectives: Anti-infectives (From admission, onward)    Start     Dose/Rate Route Frequency Ordered Stop   12/20/21 1000  micafungin (MYCAMINE) 150 mg in sodium chloride 0.9 % 100 mL IVPB        150 mg 107.5 mL/hr over 1 Hours Intravenous Every 24 hours 12/20/21 0904     12/12/21 0600  ceFEPIme (MAXIPIME) 2 g in sodium chloride 0.9 % 100 mL IVPB  Status:  Discontinued        2 g 200 mL/hr over 30 Minutes Intravenous Every 12 hours 12/11/21 2129 12/11/21 2323   12/12/21 0600  piperacillin-tazobactam (ZOSYN) IVPB 3.375 g        3.375 g 12.5 mL/hr over 240 Minutes Intravenous Every 8 hours 12/11/21 2326     12/11/21 2330  piperacillin-tazobactam (ZOSYN) IVPB 3.375 g        3.375 g 100 mL/hr over 30 Minutes Intravenous To Post Anesthesia Care Unit 12/11/21 2326 12/12/21 0041   12/11/21 1630  metroNIDAZOLE (FLAGYL) IVPB 500 mg        500 mg 100 mL/hr over 60 Minutes Intravenous  Once 12/11/21 1619 12/11/21 2049   12/11/21 1630  ceFEPIme (MAXIPIME) 2 g in sodium chloride 0.9 % 100 mL IVPB        2 g 200 mL/hr over 30 Minutes Intravenous  Once 12/11/21 1623 12/11/21 1817       Assessment/Plan: Problem List: Patient Active Problem List   Diagnosis Date Noted   Perforation of sigmoid colon due to diverticulitis 12/13/2021   Colitis 12/12/2021   Pneumoperitoneum 12/12/2021   Lactic acidosis 12/12/2021   Observation after surgery 12/11/2021   Educated about COVID-19 virus infection 09/21/2019   Cervical myelopathy ([place]) 05/14/2019   Left buttock abscess 08/27/2017   OSA on CPAP    Essential hypertension, benign    Polycythemia vera ([place])    Bipolar I disorder ([facility])    Depression    Anxiety state    Chronic combined systolic and diastolic CHF (congestive heart failure) ([place])    Pulmonary hypertension ([place])    Type 2 diabetes mellitus with other  specified complication ([place]) 34/19/6222   Bipolar disorder ([place]) 11/14/2012   Hyperlipidemia    Anxiety    Asthma 09/05/2011   Allergic rhinitis 05/16/2011   Cardiomyopathy ([place]) 07/06/2010   Morbid obesity ([place]) 07/06/2010   OSA (obstructive sleep apnea) 06/05/2010   Obesity hypoventilation syndrome ([place]) 06/05/2010    Will need to be kept NPO and give TNA to see if perforated diverticulitis with heal over.  I wouldn't rule out laparotomy and ostomy 10 Days Post-Op    LOS: 10 days   Matt B. Hassell Done, MD, [facility] [phone] to reach the surgeon on call.    12/21/2021 11:19 AM

## 2021-12-21 NOTE — Plan of Care (Signed)

## 2021-12-21 NOTE — Progress Notes (Signed)
   12/20/21 2142  Assess: MEWS Score  Temp (!) 97.5 F (36.4 C)  BP (!) 150/99  MAP (mmHg) 114  Pulse Rate (!) 110  Resp 18  Level of Consciousness Responds to Voice  SpO2 94 %  O2 Device Nasal Cannula  Patient Activity (if Appropriate) In bed  O2 Flow Rate (L/min) 2 L/min  Assess: MEWS Score  MEWS Temp 0  MEWS Systolic 0  MEWS Pulse 1  MEWS RR 0  MEWS LOC 1  MEWS Score 2  MEWS Score Color Yellow  Assess: if the MEWS score is Yellow or Red  Were vital signs taken at a resting state? Yes  Focused Assessment No change from prior assessment  Does the patient meet 2 or more of the SIRS criteria? Yes  Does the patient have a confirmed or suspected source of infection? Yes  Provider and Rapid Response Notified? Yes  MEWS guidelines implemented *See Row Information* Yes  Treat  MEWS Interventions Administered scheduled meds/treatments  Pain Scale 0-10  Pain Score Asleep  Take Vital Signs  Increase Vital Sign Frequency  Yellow: Q 2hr X 2 then Q 4hr X 2, if remains yellow, continue Q 4hrs  Escalate  MEWS: Escalate Yellow: discuss with charge nurse/RN and consider discussing with provider and RRT  Notify: Charge Nurse/RN  Name of Charge Nurse/RN Notified Emman, RN  Date Charge Nurse/RN Notified 12/20/21  Time Charge Nurse/RN Notified 2145  Assess: SIRS CRITERIA  SIRS Temperature  0  SIRS Pulse 1  SIRS Respirations  0  SIRS WBC 1  SIRS Score Sum  2

## 2021-12-22 DIAGNOSIS — K668 Other specified disorders of peritoneum: Secondary | ICD-10-CM | POA: Diagnosis not present

## 2021-12-22 LAB — CBC
HCT: 34.6 % — ABNORMAL LOW (ref 36.0–46.0)
Hemoglobin: 11 g/dL — ABNORMAL LOW (ref 12.0–15.0)
MCH: 29.8 pg (ref 26.0–34.0)
MCHC: 31.8 g/dL (ref 30.0–36.0)
MCV: 93.8 fL (ref 80.0–100.0)
Platelets: 560 10*3/uL — ABNORMAL HIGH (ref 150–400)
RBC: 3.69 MIL/uL — ABNORMAL LOW (ref 3.87–5.11)
RDW: 15.1 % (ref 11.5–15.5)
WBC: 11.8 10*3/uL — ABNORMAL HIGH (ref 4.0–10.5)
nRBC: 0 % (ref 0.0–0.2)

## 2021-12-22 LAB — BASIC METABOLIC PANEL
Anion gap: 15 (ref 5–15)
BUN: 8 mg/dL (ref 6–20)
CO2: 26 mmol/L (ref 22–32)
Calcium: 8.9 mg/dL (ref 8.9–10.3)
Chloride: 100 mmol/L (ref 98–111)
Creatinine, Ser: 0.72 mg/dL (ref 0.44–1.00)
GFR, Estimated: >60 mL/min (ref >60)
Glucose, Bld: 151 mg/dL — ABNORMAL HIGH (ref 70–99)
Potassium: 4 mmol/L (ref 3.5–5.1)
Sodium: 141 mmol/L (ref 135–145)

## 2021-12-22 LAB — GLUCOSE, CAPILLARY
Glucose-Capillary: 123 mg/dL — ABNORMAL HIGH (ref 70–99)
Glucose-Capillary: 114 mg/dL — ABNORMAL HIGH (ref 70–99)
Glucose-Capillary: 119 mg/dL — ABNORMAL HIGH (ref 70–99)
Glucose-Capillary: 127 mg/dL — ABNORMAL HIGH (ref 70–99)
Glucose-Capillary: 134 mg/dL — ABNORMAL HIGH (ref 70–99)
Glucose-Capillary: 151 mg/dL — ABNORMAL HIGH (ref 70–99)

## 2021-12-22 LAB — AEROBIC/ANAEROBIC CULTURE W GRAM STAIN (SURGICAL/DEEP WOUND)

## 2021-12-22 LAB — MAGNESIUM: Magnesium: 1.8 mg/dL (ref 1.7–2.4)

## 2021-12-22 NOTE — Plan of Care (Signed)
  Problem: Education: Goal: Knowledge of General Education information will improve Description Including pain rating scale, medication(s)/side effects and non-pharmacologic comfort measures Outcome: Progressing   

## 2021-12-22 NOTE — Progress Notes (Signed)
Patient ID: [name], female   DOB: 20-Oct-1970, 51 y.o.   MRN: [id number] [facility] Surgery Progress Note:   11 Days Post-Op   THE PLAN  Continue to monitor pelvic drainage.  If no TNA, would keep on fortified protein liquid diet  Subjective: Mental status is more clear today.  Complaints none. Objective: Vital signs in last 24 hours: Temp:  [97.8 F (36.6 C)-98.5 F (36.9 C)] 97.8 F (36.6 C) (11/24 0936) Pulse Rate:  [85-90] 90 (11/24 0936) Resp:  [16-19] 18 (11/24 0936) BP: (127-159)/(77-88) 153/88 (11/24 0936) SpO2:  [91 %-94 %] 91 % (11/24 0936)  Intake/Output from previous day: 11/23 0701 - 11/24 0700 In: 342.6 [P.O.:240; IV Piggyback:62.6] Out: 252.5 [Drains:252.5] Intake/Output this shift: Total I/O In: 5 [I.V.:5] Out: 120 [Drains:120]  Physical Exam: Work of breathing is normal;  drains with no obvious feculent drainage  Lab Results:  Results for orders placed or performed during the hospital encounter of 12/11/21 (from the past 48 hour(s))  Glucose, capillary     Status: Abnormal   Collection Time: 12/20/21  4:48 PM  Result Value Ref Range   Glucose-Capillary 105 (H) 70 - 99 mg/dL    Comment: Glucose reference range applies only to samples taken after fasting for at least 8 hours.  Glucose, capillary     Status: Abnormal   Collection Time: 12/20/21  8:53 PM  Result Value Ref Range   Glucose-Capillary 136 (H) 70 - 99 mg/dL    Comment: Glucose reference range applies only to samples taken after fasting for at least 8 hours.  CBC     Status: Abnormal   Collection Time: 12/20/21 11:33 PM  Result Value Ref Range   WBC 12.1 (H) 4.0 - 10.5 K/uL   RBC 3.97 3.87 - 5.11 MIL/uL   Hemoglobin 11.8 (L) 12.0 - 15.0 g/dL   HCT 37.3 36.0 - 46.0 %   MCV 94.0 80.0 - 100.0 fL   MCH 29.7 26.0 - 34.0 pg   MCHC 31.6 30.0 - 36.0 g/dL   RDW 14.8 11.5 - 15.5 %   Platelets 596 (H) 150 - 400 K/uL   nRBC 0.0 0.0 - 0.2 %    Comment: Performed at [facility], [place] [address], [place], [place] [address]  Basic metabolic panel     Status: Abnormal   Collection Time: 12/20/21 11:33 PM  Result Value Ref Range   Sodium 139 135 - 145 mmol/L   Potassium 4.2 3.5 - 5.1 mmol/L   Chloride 101 98 - 111 mmol/L   CO2 27 22 - 32 mmol/L   Glucose, Bld 137 (H) 70 - 99 mg/dL    Comment: Glucose reference range applies only to samples taken after fasting for at least 8 hours.   BUN 8 6 - 20 mg/dL   Creatinine, Ser 0.77 0.44 - 1.00 mg/dL   Calcium 8.6 (L) 8.9 - 10.3 mg/dL   GFR, Estimated >60 >60 mL/min    Comment: (NOTE) Calculated using the CKD-EPI Creatinine Equation (2021)    Anion gap 11 5 - 15    Comment: Performed at [facility] [address] [place], [place] [address]  Magnesium     Status: None   Collection Time: 12/20/21 11:33 PM  Result Value Ref Range   Magnesium 2.0 1.7 - 2.4 mg/dL    Comment: Performed at [facility], [place] [address] [place], [place] [address]  Blood gas, venous  Status: Abnormal   Collection Time: 12/20/21 11:33 PM  Result Value Ref Range   pH, Ven 7.37 7.25 - 7.43   pCO2, Ven 59 44 - 60 mmHg   pO2, Ven 48 (H) 32 - 45 mmHg   Bicarbonate 34.7 (H) 20.0 - 28.0 mmol/L   Acid-Base Excess 7.4 (H) 0.0 - 2.0 mmol/L   O2 Saturation 80.4 %   Patient temperature 36.8     Comment: Performed at [facility] [address] [place], [place] [address]  Glucose, capillary     Status: Abnormal   Collection Time: 12/21/21 12:00 AM  Result Value Ref Range   Glucose-Capillary 125 (H) 70 - 99 mg/dL    Comment: Glucose reference range applies only to samples taken after fasting for at least 8 hours.  Glucose, capillary     Status: Abnormal   Collection Time: 12/21/21  4:27 AM  Result Value Ref Range   Glucose-Capillary 123 (H) 70 - 99 mg/dL    Comment: Glucose reference range applies only to samples taken after fasting for at least 8 hours.  Glucose, capillary     Status: Abnormal   Collection Time:  12/21/21  6:17 AM  Result Value Ref Range   Glucose-Capillary 117 (H) 70 - 99 mg/dL    Comment: Glucose reference range applies only to samples taken after fasting for at least 8 hours.  Glucose, capillary     Status: Abnormal   Collection Time: 12/21/21  7:51 AM  Result Value Ref Range   Glucose-Capillary 119 (H) 70 - 99 mg/dL    Comment: Glucose reference range applies only to samples taken after fasting for at least 8 hours.  CBC     Status: Abnormal   Collection Time: 12/21/21  7:52 AM  Result Value Ref Range   WBC 11.6 (H) 4.0 - 10.5 K/uL   RBC 3.95 3.87 - 5.11 MIL/uL   Hemoglobin 12.1 12.0 - 15.0 g/dL   HCT 36.6 36.0 - 46.0 %   MCV 92.7 80.0 - 100.0 fL   MCH 30.6 26.0 - 34.0 pg   MCHC 33.1 30.0 - 36.0 g/dL   RDW 15.0 11.5 - 15.5 %   Platelets 550 (H) 150 - 400 K/uL   nRBC 0.0 0.0 - 0.2 %    Comment: Performed at [facility], [place] [address], [place], [place] [address]  Glucose, capillary     Status: Abnormal   Collection Time: 12/21/21 11:42 AM  Result Value Ref Range   Glucose-Capillary 119 (H) 70 - 99 mg/dL    Comment: Glucose reference range applies only to samples taken after fasting for at least 8 hours.  Glucose, capillary     Status: Abnormal   Collection Time: 12/21/21  3:29 PM  Result Value Ref Range   Glucose-Capillary 106 (H) 70 - 99 mg/dL    Comment: Glucose reference range applies only to samples taken after fasting for at least 8 hours.  Glucose, capillary     Status: Abnormal   Collection Time: 12/21/21  4:48 PM  Result Value Ref Range   Glucose-Capillary 114 (H) 70 - 99 mg/dL    Comment: Glucose reference range applies only to samples taken after fasting for at least 8 hours.  Glucose, capillary     Status: Abnormal   Collection Time: 12/21/21  8:42 PM  Result Value Ref Range   Glucose-Capillary 149 (H) 70 - 99 mg/dL    Comment: Glucose reference range applies only to samples  taken after fasting for at least 8 hours.  Glucose, capillary      Status: Abnormal   Collection Time: 12/22/21  2:17 AM  Result Value Ref Range   Glucose-Capillary 123 (H) 70 - 99 mg/dL    Comment: Glucose reference range applies only to samples taken after fasting for at least 8 hours.  CBC     Status: Abnormal   Collection Time: 12/22/21  3:20 AM  Result Value Ref Range   WBC 11.8 (H) 4.0 - 10.5 K/uL   RBC 3.69 (L) 3.87 - 5.11 MIL/uL   Hemoglobin 11.0 (L) 12.0 - 15.0 g/dL   HCT 34.6 (L) 36.0 - 46.0 %   MCV 93.8 80.0 - 100.0 fL   MCH 29.8 26.0 - 34.0 pg   MCHC 31.8 30.0 - 36.0 g/dL   RDW 15.1 11.5 - 15.5 %   Platelets 560 (H) 150 - 400 K/uL   nRBC 0.0 0.0 - 0.2 %    Comment: Performed at [facility] [address], [place], [place] [address]  Basic metabolic panel     Status: Abnormal   Collection Time: 12/22/21  3:20 AM  Result Value Ref Range   Sodium 141 135 - 145 mmol/L   Potassium 4.0 3.5 - 5.1 mmol/L   Chloride 100 98 - 111 mmol/L   CO2 26 22 - 32 mmol/L   Glucose, Bld 151 (H) 70 - 99 mg/dL    Comment: Glucose reference range applies only to samples taken after fasting for at least 8 hours.   BUN 8 6 - 20 mg/dL   Creatinine, Ser 0.72 0.44 - 1.00 mg/dL   Calcium 8.9 8.9 - 10.3 mg/dL   GFR, Estimated >60 >60 mL/min    Comment: (NOTE) Calculated using the CKD-EPI Creatinine Equation (2021)    Anion gap 15 5 - 15    Comment: Performed at [facility] [address] [place], [place] [address]  Magnesium     Status: None   Collection Time: 12/22/21  3:20 AM  Result Value Ref Range   Magnesium 1.8 1.7 - 2.4 mg/dL    Comment: Performed at [facility] [address], [place], [place] [address]  Glucose, capillary     Status: Abnormal   Collection Time: 12/22/21  4:42 AM  Result Value Ref Range   Glucose-Capillary 114 (H) 70 - 99 mg/dL    Comment: Glucose reference range applies only to samples taken after fasting for at least 8 hours.  Glucose, capillary     Status: Abnormal   Collection Time: 12/22/21  8:22 AM   Result Value Ref Range   Glucose-Capillary 119 (H) 70 - 99 mg/dL    Comment: Glucose reference range applies only to samples taken after fasting for at least 8 hours.  Glucose, capillary     Status: Abnormal   Collection Time: 12/22/21 11:55 AM  Result Value Ref Range   Glucose-Capillary 127 (H) 70 - 99 mg/dL    Comment: Glucose reference range applies only to samples taken after fasting for at least 8 hours.    Radiology/Results: CT GUIDED PERITONEAL/RETROPERITONEAL FLUID DRAIN BY PERC CATH  Result Date: 12/20/2021 INDICATION: History of perforated abdominal viscus, post laparotomy on 12/11/2021 (Dr. Thermon Leyland) with postprocedural CT scan demonstrated development intraluminal fluid collections. As such, patient presents now for CT-guided aspiration and/or percutaneous drainage catheter placement. EXAM: CT-GUIDED PERCUTANEOUS CATHETER PLACEMENT X4 COMPARISON:  CT abdomen and pelvis-12/18/2021; 12/11/2021 MEDICATIONS: Zofran 4 mg IV;  the patient is currently admitted to the hospital and receiving intravenous antibiotics. The antibiotics were administered within an appropriate time frame prior to the initiation of the procedure. ANESTHESIA/SEDATION: Moderate (conscious) sedation was employed during this procedure. A total of Versed 2 mg and Fentanyl 200 mcg was administered intravenously. Moderate Sedation Time: 70 minutes. The patient's level of consciousness and vital signs were monitored continuously by radiology nursing throughout the procedure under my direct supervision. CONTRAST:  None COMPLICATIONS: None immediate. PROCEDURE: RADIATION DOSE REDUCTION: This exam was performed according to the departmental dose-optimization program which includes automated exposure control, adjustment of the mA and/or kV according to patient size and/or use of iterative reconstruction technique. Informed written consent was obtained from the patient after a discussion of the risks, benefits and alternatives to  treatment. The patient was placed supine on the CT gantry and a pre procedural CT was performed re-demonstrating the known abscesses/fluid collections within the abdomen with dominant collection within the right upper abdominal quadrant measuring a proximally 6.8 x 3.5 cm (image 47, series 2), dominant collection within the left mid abdomen measuring 6.1 x 3.0 cm (59, series 2), dominant collection within the left lower abdomen/pelvis noted to contain enteric contrast and measuring approximately 8.5 x 5.4 cm (image 74, series 2) and dominant collection with the right lower abdominal quadrant measuring 9.8 x 6.5 cm (image 76, series 2. CT gantry table positions were marked and the procedure was planned. A timeout was performed prior to the initiation of the procedure. The abdomen was prepped and draped in the usual sterile fashion. The overlying soft tissues were anesthetized with 1% lidocaine with epinephrine. Under intermittent CT guidance, each collection was targeted with an 18 gauge trocar needle ultimately allowing coiling of a short Amplatz wire within all collections. Appropriate position was confirmed with CT imaging. Next, tracks were serially dilated allowing placement of 10 French drainage catheters into the smaller collections within the right upper and left mid abdomen and 12 French drainage catheters into both the right and left lower abdominal/pelvic collections. At this time, approximately 300 cc of similar appearing feculent, foul-smelling fluid was aspirated from all of the collections with the majority of infection being aspirated from the 2 lower abdominal/pelvic drainage catheters, right-greater-than-left. All drainage catheters were flushed with saline and connected to gravity bags. All drainage catheters were secured at the skin entrance site within interrupted sutures. Dressings were applied. The patient tolerated the above procedures well without immediate postprocedural complication.  IMPRESSION: 1. Successful placement of a 10 French drainage catheter into the right upper and left mid abdominal abscesses. 2. Successful placement of 12 French drainage catheters into right and left lower abdominal/pelvic abscesses. Enteric contrast is seen within the left lower abdominal/pelvic collection suggesting the adjacent sigmoid colon likely serves as the etiology of the patient's enteric perforation. 3. A total of 300 cc of similar appearing feculent, foul-smelling fluid was aspirated from all drainage catheters with the majority of infection being aspirated from the two lower abdominal/pelvic drainage catheters, right-greater-than-left. Electronically Signed   By: Sandi Mariscal M.D.   On: 12/20/2021 14:55   CT GUIDED PERITONEAL/RETROPERITONEAL FLUID DRAIN BY PERC CATH  Result Date: 12/20/2021 INDICATION: History of perforated abdominal viscus, post laparotomy on 12/11/2021 (Dr. Thermon Leyland) with postprocedural CT scan demonstrated development intraluminal fluid collections. As such, patient presents now for CT-guided aspiration and/or percutaneous drainage catheter placement. EXAM: CT-GUIDED PERCUTANEOUS CATHETER PLACEMENT X4 COMPARISON:  CT abdomen and pelvis-12/18/2021; 12/11/2021 MEDICATIONS: Zofran 4 mg IV; the patient is  currently admitted to the hospital and receiving intravenous antibiotics. The antibiotics were administered within an appropriate time frame prior to the initiation of the procedure. ANESTHESIA/SEDATION: Moderate (conscious) sedation was employed during this procedure. A total of Versed 2 mg and Fentanyl 200 mcg was administered intravenously. Moderate Sedation Time: 70 minutes. The patient's level of consciousness and vital signs were monitored continuously by radiology nursing throughout the procedure under my direct supervision. CONTRAST:  None COMPLICATIONS: None immediate. PROCEDURE: RADIATION DOSE REDUCTION: This exam was performed according to the departmental  dose-optimization program which includes automated exposure control, adjustment of the mA and/or kV according to patient size and/or use of iterative reconstruction technique. Informed written consent was obtained from the patient after a discussion of the risks, benefits and alternatives to treatment. The patient was placed supine on the CT gantry and a pre procedural CT was performed re-demonstrating the known abscesses/fluid collections within the abdomen with dominant collection within the right upper abdominal quadrant measuring a proximally 6.8 x 3.5 cm (image 47, series 2), dominant collection within the left mid abdomen measuring 6.1 x 3.0 cm (59, series 2), dominant collection within the left lower abdomen/pelvis noted to contain enteric contrast and measuring approximately 8.5 x 5.4 cm (image 74, series 2) and dominant collection with the right lower abdominal quadrant measuring 9.8 x 6.5 cm (image 76, series 2. CT gantry table positions were marked and the procedure was planned. A timeout was performed prior to the initiation of the procedure. The abdomen was prepped and draped in the usual sterile fashion. The overlying soft tissues were anesthetized with 1% lidocaine with epinephrine. Under intermittent CT guidance, each collection was targeted with an 18 gauge trocar needle ultimately allowing coiling of a short Amplatz wire within all collections. Appropriate position was confirmed with CT imaging. Next, tracks were serially dilated allowing placement of 10 French drainage catheters into the smaller collections within the right upper and left mid abdomen and 12 French drainage catheters into both the right and left lower abdominal/pelvic collections. At this time, approximately 300 cc of similar appearing feculent, foul-smelling fluid was aspirated from all of the collections with the majority of infection being aspirated from the 2 lower abdominal/pelvic drainage catheters, right-greater-than-left.  All drainage catheters were flushed with saline and connected to gravity bags. All drainage catheters were secured at the skin entrance site within interrupted sutures. Dressings were applied. The patient tolerated the above procedures well without immediate postprocedural complication. IMPRESSION: 1. Successful placement of a 10 French drainage catheter into the right upper and left mid abdominal abscesses. 2. Successful placement of 12 French drainage catheters into right and left lower abdominal/pelvic abscesses. Enteric contrast is seen within the left lower abdominal/pelvic collection suggesting the adjacent sigmoid colon likely serves as the etiology of the patient's enteric perforation. 3. A total of 300 cc of similar appearing feculent, foul-smelling fluid was aspirated from all drainage catheters with the majority of infection being aspirated from the two lower abdominal/pelvic drainage catheters, right-greater-than-left. Electronically Signed   By: Sandi Mariscal M.D.   On: 12/20/2021 14:55   CT GUIDED PERITONEAL/RETROPERITONEAL FLUID DRAIN BY PERC CATH  Result Date: 12/20/2021 INDICATION: History of perforated abdominal viscus, post laparotomy on 12/11/2021 (Dr. Thermon Leyland) with postprocedural CT scan demonstrated development intraluminal fluid collections. As such, patient presents now for CT-guided aspiration and/or percutaneous drainage catheter placement. EXAM: CT-GUIDED PERCUTANEOUS CATHETER PLACEMENT X4 COMPARISON:  CT abdomen and pelvis-12/18/2021; 12/11/2021 MEDICATIONS: Zofran 4 mg IV; the patient is currently admitted to  the hospital and receiving intravenous antibiotics. The antibiotics were administered within an appropriate time frame prior to the initiation of the procedure. ANESTHESIA/SEDATION: Moderate (conscious) sedation was employed during this procedure. A total of Versed 2 mg and Fentanyl 200 mcg was administered intravenously. Moderate Sedation Time: 70 minutes. The patient's  level of consciousness and vital signs were monitored continuously by radiology nursing throughout the procedure under my direct supervision. CONTRAST:  None COMPLICATIONS: None immediate. PROCEDURE: RADIATION DOSE REDUCTION: This exam was performed according to the departmental dose-optimization program which includes automated exposure control, adjustment of the mA and/or kV according to patient size and/or use of iterative reconstruction technique. Informed written consent was obtained from the patient after a discussion of the risks, benefits and alternatives to treatment. The patient was placed supine on the CT gantry and a pre procedural CT was performed re-demonstrating the known abscesses/fluid collections within the abdomen with dominant collection within the right upper abdominal quadrant measuring a proximally 6.8 x 3.5 cm (image 47, series 2), dominant collection within the left mid abdomen measuring 6.1 x 3.0 cm (59, series 2), dominant collection within the left lower abdomen/pelvis noted to contain enteric contrast and measuring approximately 8.5 x 5.4 cm (image 74, series 2) and dominant collection with the right lower abdominal quadrant measuring 9.8 x 6.5 cm (image 76, series 2. CT gantry table positions were marked and the procedure was planned. A timeout was performed prior to the initiation of the procedure. The abdomen was prepped and draped in the usual sterile fashion. The overlying soft tissues were anesthetized with 1% lidocaine with epinephrine. Under intermittent CT guidance, each collection was targeted with an 18 gauge trocar needle ultimately allowing coiling of a short Amplatz wire within all collections. Appropriate position was confirmed with CT imaging. Next, tracks were serially dilated allowing placement of 10 French drainage catheters into the smaller collections within the right upper and left mid abdomen and 12 French drainage catheters into both the right and left lower  abdominal/pelvic collections. At this time, approximately 300 cc of similar appearing feculent, foul-smelling fluid was aspirated from all of the collections with the majority of infection being aspirated from the 2 lower abdominal/pelvic drainage catheters, right-greater-than-left. All drainage catheters were flushed with saline and connected to gravity bags. All drainage catheters were secured at the skin entrance site within interrupted sutures. Dressings were applied. The patient tolerated the above procedures well without immediate postprocedural complication. IMPRESSION: 1. Successful placement of a 10 French drainage catheter into the right upper and left mid abdominal abscesses. 2. Successful placement of 12 French drainage catheters into right and left lower abdominal/pelvic abscesses. Enteric contrast is seen within the left lower abdominal/pelvic collection suggesting the adjacent sigmoid colon likely serves as the etiology of the patient's enteric perforation. 3. A total of 300 cc of similar appearing feculent, foul-smelling fluid was aspirated from all drainage catheters with the majority of infection being aspirated from the two lower abdominal/pelvic drainage catheters, right-greater-than-left. Electronically Signed   By: Sandi Mariscal M.D.   On: 12/20/2021 14:55    Anti-infectives: Anti-infectives (From admission, onward)    Start     Dose/Rate Route Frequency Ordered Stop   12/20/21 1000  micafungin (MYCAMINE) 150 mg in sodium chloride 0.9 % 100 mL IVPB        150 mg 107.5 mL/hr over 1 Hours Intravenous Every 24 hours 12/20/21 0904     12/12/21 0600  ceFEPIme (MAXIPIME) 2 g in sodium chloride 0.9 % 100  mL IVPB  Status:  Discontinued        2 g 200 mL/hr over 30 Minutes Intravenous Every 12 hours 12/11/21 2129 12/11/21 2323   12/12/21 0600  piperacillin-tazobactam (ZOSYN) IVPB 3.375 g        3.375 g 12.5 mL/hr over 240 Minutes Intravenous Every 8 hours 12/11/21 2326     12/11/21 2330   piperacillin-tazobactam (ZOSYN) IVPB 3.375 g        3.375 g 100 mL/hr over 30 Minutes Intravenous To Post Anesthesia Care Unit 12/11/21 2326 12/12/21 0041   12/11/21 1630  metroNIDAZOLE (FLAGYL) IVPB 500 mg        500 mg 100 mL/hr over 60 Minutes Intravenous  Once 12/11/21 1619 12/11/21 2049   12/11/21 1630  ceFEPIme (MAXIPIME) 2 g in sodium chloride 0.9 % 100 mL IVPB        2 g 200 mL/hr over 30 Minutes Intravenous  Once 12/11/21 1623 12/11/21 1817       Assessment/Plan: Problem List: Patient Active Problem List   Diagnosis Date Noted   Perforation of sigmoid colon due to diverticulitis 12/13/2021   Colitis 12/12/2021   Pneumoperitoneum 12/12/2021   Lactic acidosis 12/12/2021   Observation after surgery 12/11/2021   Educated about COVID-19 virus infection 09/21/2019   Cervical myelopathy ([place]) 05/14/2019   Left buttock abscess 08/27/2017   OSA on CPAP    Essential hypertension, benign    Polycythemia vera ([place])    Bipolar I disorder ([facility])    Depression    Anxiety state    Chronic combined systolic and diastolic CHF (congestive heart failure) ([place])    Pulmonary hypertension ([place])    Type 2 diabetes mellitus with other specified complication ([place] [employer]) 32/44/0102   Bipolar disorder ([place]) 11/14/2012   Hyperlipidemia    Anxiety    Asthma 09/05/2011   Allergic rhinitis 05/16/2011   Cardiomyopathy ([place]) 07/06/2010   Morbid obesity ([place]) 07/06/2010   OSA (obstructive sleep apnea) 06/05/2010   Obesity hypoventilation syndrome ([place]) 06/05/2010    Observation with drainage. 11 Days Post-Op    LOS: 11 days   Matt B. Hassell Done, MD, [facility] [phone] to reach the surgeon on call.    12/22/2021 1:31 PM

## 2021-12-22 NOTE — Progress Notes (Signed)
PROGRESS NOTE    JEILANI GRUPE  NUU:[id number] DOB: 1970/07/02 DOA: 12/11/2021 PCP: [place], Betty G, MD    Brief Narrative:   [name] is a 51 y.o. female with past medical history significant for OSA/OHS on CPAP, bipolar 1 disorder, Hx chronic systolic congestive heart failure/cardiomyopathy who presented to [facility] ED on 11/13 with abdominal pain.  In the ED, CT abdomen/pelvis notable for free air and fluid.  General surgery was consulted and patient was taken emergently to the operating room with finding of a well-formed abscess cavity in the lower mid abdomen near the sigmoid colon with purulent peritonitis and fibrinous exudate suspected secondary to perforated sigmoid diverticulitis.  Patient had laparoscopic peritoneal lavage with drains placed and patient was transferred to the intensive care unit postoperatively as she remained intubated but was successfully extubated the following day.  Given continued purulence from drain output, repeat CT abdomen/pelvis was performed on 11/20 with findings of multiple fluid and gas collections; IR was consulted on 11/21 for repeat drain placement.  Assessment & Plan:   Abdominal abscess/peritonitis 2/2 presumed perforated sigmoid diverticulitis Patient presenting to ED with abdominal pain, CT abdomen/pelvis notable for free air and fluid.  General surgery was consulted and patient was taken emergently to the OR with findings of a well-formed abscess cavity lower mid abdomen near the sigmoid colon with purulent peritonitis and fibrinous exudate suspected secondary to a perforated sigmoid diverticulitis; and underwent laparoscopic peritoneal lavage with drain placement.  Repeat CT abdomen/pelvis 7/20 with multiple air-fluid levels and general surgery consulted IR for further drain placement under IR guidance. -- General surgery following, appreciate assistance -- WBC 5.6>>18.8>>12.5>13.3>12.9>12.3>11.6>11.8 -- Blood cultures 11/13: +  Bacteroides thetaiotaomicron -- Operative culture 11/13: E. coli, abundant Bacteroides thetaiotaomicron -- Culture from IR drain placement 11/21: Abundant Bacteroides, Clostridium perfringens, rare budding yeast -- Zosyn 3.375 g IV every 8 hours -- Micafungin 150 mg IV every 24 hours -- Currently general surgery continues to recommend no further operative management this time and recommend drainage for now and monitoring, IV antibiotics -- Continue to monitor drain output closely (currently 7 drains; 3 placed by CCS and 4 placed by IR); of which lower quadrant drains continue with significant feculent output -- CBC daily  OSA/OHS Follows with pulmonology outpatient, Dr. Elsworth Soho. -- Continue nocturnal CPAP  Hx chronic systolic/diastolic congestive heart failure/cardiomyopathy Follows with cardiology outpatient, Dr. Percival Spanish.  Most recent echocardiogram February 2022 with now improved LVEF of 60 to 34%, grade 1 diastolic dysfunction --Continue to hold home Lasix  Hx seizures, bipolar disorder, depression --Continue alprazolam '1mg'$  PO TID, Lyrica '300mg'$  PO BID, Vraylar '6mg'$  PO qHS  Acute renal failure Etiology likely secondary to infection versus dehydration from abdominal abscess/peritonitis as above.  Home Lasix on hold, supported with IV fluids with now normalization of creatinine, 0.79. --Continue intermittent monitoring of renal function  GERD: --Protonix 40 mg p.o. twice daily  Morbid obesity Body mass index is 52.57 kg/m.  Discussed with patient needs for aggressive lifestyle changes/weight loss as this complicates all facets of care.  Outpatient follow-up with PCP.  May benefit from bariatric evaluation outpatient.   DVT prophylaxis: heparin injection 5,000 Units Start: 12/12/21 0600    Code Status: Full Code Family Communication: Updated sister-in-law present at bedside this morning Disposition Plan:  Level of care: Med-Surg Status is: Inpatient Remains inpatient appropriate  because: IV antibiotics    Consultants:  General surgery Interventional radiology  Procedures:  Laparoscopic peritoneal lavage with drain placement x 3, Dr. Thermon Leyland;  11/13 CT guided drain placement x 4; IR, 11/21, Dr. Pascal Lux  Antimicrobials:  Zosyn 11/13>> Micafungin 11/22>> Metronidazole 11/13 - 11/13   Subjective: Patient seen and examined bedside, resting comfortably.  Sitting in bedside chair eating breakfast.  Sister-in-law present.  Continues with significant feculent/purulent drainage from lower quadrant drains.  Continues on IV antibiotics. Patient denies headache, no fever/chills/night sweats, no nausea/vomiting, no diarrhea, no chest pain, no palpitations, no shortness of breath, no abdominal pain, no cough/congestion.  No acute events overnight per nursing staff.  Objective: Vitals:   12/21/21 1536 12/21/21 2040 12/22/21 0441 12/22/21 0936  BP: (!) 157/79 (!) 159/77 127/86 (!) 153/88  Pulse: 86 87 85 90  Resp: '18 16 19 18  '$ Temp: 97.8 F (36.6 C) 98.4 F (36.9 C) 98.5 F (36.9 C) 97.8 F (36.6 C)  TempSrc: Oral   Oral  SpO2: 92% 94% 94% 91%  Weight:      Height:        Intake/Output Summary (Last 24 hours) at 12/22/2021 1157 Last data filed at 12/22/2021 0960 Gross per 24 hour  Intake 102.64 ml  Output 252.5 ml  Net -149.86 ml   Filed Weights   12/11/21 2114 12/18/21 0432  Weight: (!) 138.6 kg (!) 143.3 kg    Examination:  Physical Exam: GEN: NAD, alert and oriented x 3, obese; appears older than stated age, chronically ill in appearance HEENT: NCAT, PERRL, EOMI, sclera clear, MMM PULM: CTAB w/o wheezes/crackles, normal respiratory effort, on room air CV: RRR w/o M/G/R GI: abd soft, mild lower abdominal pain on palpation, NABS, no R/G/M; abdominal drains x 7 noted with continued feculent output MSK: no peripheral edema, moves all extremities independently NEURO: CN II-XII intact, no focal deficits, sensation to light touch intact PSYCH: normal  mood/affect Integumentary: Abdominal drains/surgical incision sites noted, otherwise no other concerning rashes/lesions/wounds noted on exposed skin surfaces.      Data Reviewed: I have personally reviewed following labs and imaging studies  CBC: Recent Labs  Lab 12/19/21 0511 12/20/21 0416 12/20/21 2333 12/21/21 0752 12/22/21 0320  WBC 12.9* 12.3* 12.1* 11.6* 11.8*  HGB 12.3 11.6* 11.8* 12.1 11.0*  HCT 37.7 35.8* 37.3 36.6 34.6*  MCV 93.8 93.7 94.0 92.7 93.8  PLT 364 556* 596* 550* 454*   Basic Metabolic Panel: Recent Labs  Lab 12/16/21 0908 12/18/21 0325 12/19/21 0511 12/20/21 0416 12/20/21 2333 12/22/21 0320  NA 141 141 142 141 139 141  K 3.9 4.2 4.4 3.7 4.2 4.0  CL 101 107 103 101 101 100  CO2 27 19* '26 29 27 26  '$ GLUCOSE 134* 134* 153* 129* 137* 151*  BUN '11 9 7 8 8 8  '$ CREATININE 0.92 1.01* 0.77 0.79 0.77 0.72  CALCIUM 8.2* 8.2* 8.7* 8.7* 8.6* 8.9  MG 2.1 2.1  --  2.2 2.0 1.8   GFR: Estimated Creatinine Clearance: 118.8 mL/min (by C-G formula based on SCr of 0.72 mg/dL). Liver Function Tests: No results for input(s): "AST", "ALT", "ALKPHOS", "BILITOT", "PROT", "ALBUMIN" in the last 168 hours. No results for input(s): "LIPASE", "AMYLASE" in the last 168 hours. No results for input(s): "AMMONIA" in the last 168 hours. Coagulation Profile: No results for input(s): "INR", "PROTIME" in the last 168 hours. Cardiac Enzymes: No results for input(s): "CKTOTAL", "CKMB", "CKMBINDEX", "TROPONINI" in the last 168 hours. BNP (last 3 results) No results for input(s): "PROBNP" in the last 8760 hours. HbA1C: No results for input(s): "HGBA1C" in the last 72 hours. CBG: Recent Labs  Lab 12/21/21 2042  12/22/21 0217 12/22/21 0442 12/22/21 0822 12/22/21 1155  GLUCAP 149* 123* 114* 119* 127*   Lipid Profile: No results for input(s): "CHOL", "HDL", "LDLCALC", "TRIG", "CHOLHDL", "LDLDIRECT" in the last 72 hours. Thyroid Function Tests: No results for input(s): "TSH",  "T4TOTAL", "FREET4", "T3FREE", "THYROIDAB" in the last 72 hours. Anemia Panel: No results for input(s): "VITAMINB12", "FOLATE", "FERRITIN", "TIBC", "IRON", "RETICCTPCT" in the last 72 hours. Sepsis Labs: No results for input(s): "PROCALCITON", "LATICACIDVEN" in the last 168 hours.  Recent Results (from the past 240 hour(s))  Aerobic/Anaerobic Culture w Gram Stain (surgical/deep wound)     Status: None (Preliminary result)   Collection Time: 12/19/21  4:58 PM   Specimen: Wound; Abscess  Result Value Ref Range Status   Specimen Description WOUND  Final   Special Requests POST CT GUIDED DRAIN PLACEMENT  Final   Gram Stain   Final    FEW WBC PRESENT, PREDOMINANTLY PMN MODERATE GRAM POSITIVE RODS FEW GRAM NEGATIVE RODS RARE BUDDING YEAST SEEN    Culture   Final    ABUNDANT YEAST IDENTIFICATION TO FOLLOW ABUNDANT BACTEROIDES SPECIES NOT FRAGILIS BETA LACTAMASE POSITIVE FEW CLOSTRIDIUM PERFRINGENS Standardized susceptibility testing for this organism is not available. Performed at [facility], [address] [place], [place] [address]    Report Status PENDING  Incomplete         Radiology Studies: CT GUIDED PERITONEAL/RETROPERITONEAL FLUID DRAIN BY PERC CATH  Result Date: 12/20/2021 INDICATION: History of perforated abdominal viscus, post laparotomy on 12/11/2021 (Dr. Thermon Leyland) with postprocedural CT scan demonstrated development intraluminal fluid collections. As such, patient presents now for CT-guided aspiration and/or percutaneous drainage catheter placement. EXAM: CT-GUIDED PERCUTANEOUS CATHETER PLACEMENT X4 COMPARISON:  CT abdomen and pelvis-12/18/2021; 12/11/2021 MEDICATIONS: Zofran 4 mg IV; the patient is currently admitted to the hospital and receiving intravenous antibiotics. The antibiotics were administered within an appropriate time frame prior to the initiation of the procedure. ANESTHESIA/SEDATION: Moderate (conscious) sedation was employed during this  procedure. A total of Versed 2 mg and Fentanyl 200 mcg was administered intravenously. Moderate Sedation Time: 70 minutes. The patient's level of consciousness and vital signs were monitored continuously by radiology nursing throughout the procedure under my direct supervision. CONTRAST:  None COMPLICATIONS: None immediate. PROCEDURE: RADIATION DOSE REDUCTION: This exam was performed according to the departmental dose-optimization program which includes automated exposure control, adjustment of the mA and/or kV according to patient size and/or use of iterative reconstruction technique. Informed written consent was obtained from the patient after a discussion of the risks, benefits and alternatives to treatment. The patient was placed supine on the CT gantry and a pre procedural CT was performed re-demonstrating the known abscesses/fluid collections within the abdomen with dominant collection within the right upper abdominal quadrant measuring a proximally 6.8 x 3.5 cm (image 47, series 2), dominant collection within the left mid abdomen measuring 6.1 x 3.0 cm (59, series 2), dominant collection within the left lower abdomen/pelvis noted to contain enteric contrast and measuring approximately 8.5 x 5.4 cm (image 74, series 2) and dominant collection with the right lower abdominal quadrant measuring 9.8 x 6.5 cm (image 76, series 2. CT gantry table positions were marked and the procedure was planned. A timeout was performed prior to the initiation of the procedure. The abdomen was prepped and draped in the usual sterile fashion. The overlying soft tissues were anesthetized with 1% lidocaine with epinephrine. Under intermittent CT guidance, each collection was targeted with an 18 gauge trocar needle ultimately allowing coiling of a  short Amplatz wire within all collections. Appropriate position was confirmed with CT imaging. Next, tracks were serially dilated allowing placement of 10 French drainage catheters into the  smaller collections within the right upper and left mid abdomen and 12 French drainage catheters into both the right and left lower abdominal/pelvic collections. At this time, approximately 300 cc of similar appearing feculent, foul-smelling fluid was aspirated from all of the collections with the majority of infection being aspirated from the 2 lower abdominal/pelvic drainage catheters, right-greater-than-left. All drainage catheters were flushed with saline and connected to gravity bags. All drainage catheters were secured at the skin entrance site within interrupted sutures. Dressings were applied. The patient tolerated the above procedures well without immediate postprocedural complication. IMPRESSION: 1. Successful placement of a 10 French drainage catheter into the right upper and left mid abdominal abscesses. 2. Successful placement of 12 French drainage catheters into right and left lower abdominal/pelvic abscesses. Enteric contrast is seen within the left lower abdominal/pelvic collection suggesting the adjacent sigmoid colon likely serves as the etiology of the patient's enteric perforation. 3. A total of 300 cc of similar appearing feculent, foul-smelling fluid was aspirated from all drainage catheters with the majority of infection being aspirated from the two lower abdominal/pelvic drainage catheters, right-greater-than-left. Electronically Signed   By: Sandi Mariscal M.D.   On: 12/20/2021 14:55   CT GUIDED PERITONEAL/RETROPERITONEAL FLUID DRAIN BY PERC CATH  Result Date: 12/20/2021 INDICATION: History of perforated abdominal viscus, post laparotomy on 12/11/2021 (Dr. Thermon Leyland) with postprocedural CT scan demonstrated development intraluminal fluid collections. As such, patient presents now for CT-guided aspiration and/or percutaneous drainage catheter placement. EXAM: CT-GUIDED PERCUTANEOUS CATHETER PLACEMENT X4 COMPARISON:  CT abdomen and pelvis-12/18/2021; 12/11/2021 MEDICATIONS: Zofran 4 mg  IV; the patient is currently admitted to the hospital and receiving intravenous antibiotics. The antibiotics were administered within an appropriate time frame prior to the initiation of the procedure. ANESTHESIA/SEDATION: Moderate (conscious) sedation was employed during this procedure. A total of Versed 2 mg and Fentanyl 200 mcg was administered intravenously. Moderate Sedation Time: 70 minutes. The patient's level of consciousness and vital signs were monitored continuously by radiology nursing throughout the procedure under my direct supervision. CONTRAST:  None COMPLICATIONS: None immediate. PROCEDURE: RADIATION DOSE REDUCTION: This exam was performed according to the departmental dose-optimization program which includes automated exposure control, adjustment of the mA and/or kV according to patient size and/or use of iterative reconstruction technique. Informed written consent was obtained from the patient after a discussion of the risks, benefits and alternatives to treatment. The patient was placed supine on the CT gantry and a pre procedural CT was performed re-demonstrating the known abscesses/fluid collections within the abdomen with dominant collection within the right upper abdominal quadrant measuring a proximally 6.8 x 3.5 cm (image 47, series 2), dominant collection within the left mid abdomen measuring 6.1 x 3.0 cm (59, series 2), dominant collection within the left lower abdomen/pelvis noted to contain enteric contrast and measuring approximately 8.5 x 5.4 cm (image 74, series 2) and dominant collection with the right lower abdominal quadrant measuring 9.8 x 6.5 cm (image 76, series 2. CT gantry table positions were marked and the procedure was planned. A timeout was performed prior to the initiation of the procedure. The abdomen was prepped and draped in the usual sterile fashion. The overlying soft tissues were anesthetized with 1% lidocaine with epinephrine. Under intermittent CT guidance, each  collection was targeted with an 18 gauge trocar needle ultimately allowing coiling of a short Amplatz  wire within all collections. Appropriate position was confirmed with CT imaging. Next, tracks were serially dilated allowing placement of 10 French drainage catheters into the smaller collections within the right upper and left mid abdomen and 12 French drainage catheters into both the right and left lower abdominal/pelvic collections. At this time, approximately 300 cc of similar appearing feculent, foul-smelling fluid was aspirated from all of the collections with the majority of infection being aspirated from the 2 lower abdominal/pelvic drainage catheters, right-greater-than-left. All drainage catheters were flushed with saline and connected to gravity bags. All drainage catheters were secured at the skin entrance site within interrupted sutures. Dressings were applied. The patient tolerated the above procedures well without immediate postprocedural complication. IMPRESSION: 1. Successful placement of a 10 French drainage catheter into the right upper and left mid abdominal abscesses. 2. Successful placement of 12 French drainage catheters into right and left lower abdominal/pelvic abscesses. Enteric contrast is seen within the left lower abdominal/pelvic collection suggesting the adjacent sigmoid colon likely serves as the etiology of the patient's enteric perforation. 3. A total of 300 cc of similar appearing feculent, foul-smelling fluid was aspirated from all drainage catheters with the majority of infection being aspirated from the two lower abdominal/pelvic drainage catheters, right-greater-than-left. Electronically Signed   By: Sandi Mariscal M.D.   On: 12/20/2021 14:55   CT GUIDED PERITONEAL/RETROPERITONEAL FLUID DRAIN BY PERC CATH  Result Date: 12/20/2021 INDICATION: History of perforated abdominal viscus, post laparotomy on 12/11/2021 (Dr. Thermon Leyland) with postprocedural CT scan demonstrated  development intraluminal fluid collections. As such, patient presents now for CT-guided aspiration and/or percutaneous drainage catheter placement. EXAM: CT-GUIDED PERCUTANEOUS CATHETER PLACEMENT X4 COMPARISON:  CT abdomen and pelvis-12/18/2021; 12/11/2021 MEDICATIONS: Zofran 4 mg IV; the patient is currently admitted to the hospital and receiving intravenous antibiotics. The antibiotics were administered within an appropriate time frame prior to the initiation of the procedure. ANESTHESIA/SEDATION: Moderate (conscious) sedation was employed during this procedure. A total of Versed 2 mg and Fentanyl 200 mcg was administered intravenously. Moderate Sedation Time: 70 minutes. The patient's level of consciousness and vital signs were monitored continuously by radiology nursing throughout the procedure under my direct supervision. CONTRAST:  None COMPLICATIONS: None immediate. PROCEDURE: RADIATION DOSE REDUCTION: This exam was performed according to the departmental dose-optimization program which includes automated exposure control, adjustment of the mA and/or kV according to patient size and/or use of iterative reconstruction technique. Informed written consent was obtained from the patient after a discussion of the risks, benefits and alternatives to treatment. The patient was placed supine on the CT gantry and a pre procedural CT was performed re-demonstrating the known abscesses/fluid collections within the abdomen with dominant collection within the right upper abdominal quadrant measuring a proximally 6.8 x 3.5 cm (image 47, series 2), dominant collection within the left mid abdomen measuring 6.1 x 3.0 cm (59, series 2), dominant collection within the left lower abdomen/pelvis noted to contain enteric contrast and measuring approximately 8.5 x 5.4 cm (image 74, series 2) and dominant collection with the right lower abdominal quadrant measuring 9.8 x 6.5 cm (image 76, series 2. CT gantry table positions were  marked and the procedure was planned. A timeout was performed prior to the initiation of the procedure. The abdomen was prepped and draped in the usual sterile fashion. The overlying soft tissues were anesthetized with 1% lidocaine with epinephrine. Under intermittent CT guidance, each collection was targeted with an 18 gauge trocar needle ultimately allowing coiling of a short Amplatz wire within all  collections. Appropriate position was confirmed with CT imaging. Next, tracks were serially dilated allowing placement of 10 French drainage catheters into the smaller collections within the right upper and left mid abdomen and 12 French drainage catheters into both the right and left lower abdominal/pelvic collections. At this time, approximately 300 cc of similar appearing feculent, foul-smelling fluid was aspirated from all of the collections with the majority of infection being aspirated from the 2 lower abdominal/pelvic drainage catheters, right-greater-than-left. All drainage catheters were flushed with saline and connected to gravity bags. All drainage catheters were secured at the skin entrance site within interrupted sutures. Dressings were applied. The patient tolerated the above procedures well without immediate postprocedural complication. IMPRESSION: 1. Successful placement of a 10 French drainage catheter into the right upper and left mid abdominal abscesses. 2. Successful placement of 12 French drainage catheters into right and left lower abdominal/pelvic abscesses. Enteric contrast is seen within the left lower abdominal/pelvic collection suggesting the adjacent sigmoid colon likely serves as the etiology of the patient's enteric perforation. 3. A total of 300 cc of similar appearing feculent, foul-smelling fluid was aspirated from all drainage catheters with the majority of infection being aspirated from the two lower abdominal/pelvic drainage catheters, right-greater-than-left. Electronically Signed    By: Sandi Mariscal M.D.   On: 12/20/2021 14:55        Scheduled Meds:  acetaminophen  1,000 mg Oral Q6H   ALPRAZolam  1 mg Oral TID   amphetamine-dextroamphetamine  20 mg Oral TID   cariprazine  6 mg Oral QPM   Dextromethorphan-quiNIDine  1 capsule Oral Q12H   docusate sodium  100 mg Oral BID   heparin injection (subcutaneous)  5,000 Units Subcutaneous Q8H   insulin aspart  0-20 Units Subcutaneous Q4H   lidocaine  20 mL Intradermal Once   methocarbamol  1,000 mg Oral Q8H   mouth rinse  15 mL Mouth Rinse 4 times per day   pantoprazole  40 mg Oral BID   polyethylene glycol  17 g Oral Daily   pregabalin  300 mg Oral BID   sodium chloride flush  5 mL Intracatheter Q8H   Continuous Infusions:  micafungin (MYCAMINE) 150 mg in sodium chloride 0.9 % 100 mL IVPB 150 mg (12/22/21 1005)   piperacillin-tazobactam (ZOSYN)  IV 3.375 g (12/22/21 0537)     LOS: 11 days    Time spent: 51 minutes spent on chart review, discussion with nursing staff, consultants, updating family and interview/physical exam; more than 50% of that time was spent in counseling and/or coordination of care.    Annabel Gibeau J [place] DO [facility] Available via Epic secure chat 7am-7pm After these hours, please refer to coverage provider listed on [email or web address] 12/22/2021, 11:57 AM

## 2021-12-22 NOTE — Progress Notes (Signed)
Referring Physician(s): Dr. Johnathan Hausen  Supervising Physician: Markus Daft  Patient Status:  [facility] - In-pt  Chief Complaint: Perforated diverticulitis  Subjective: Flat affect. Slowly responsive. Multiple abdominal drains in place.   Allergies: Savella [milnacipran hcl]  Medications: Prior to Admission medications   Medication Sig Start Date End Date Taking? Authorizing Provider  acetaminophen (TYLENOL) 500 MG tablet Take 1,000 mg by mouth every 6 (six) hours as needed for moderate pain.    Yes [provider]  albuterol (VENTOLIN HFA) 108 (90 Base) MCG/ACT inhaler Inhale 2 puffs into the lungs every 6 (six) hours as needed for wheezing or shortness of breath. 10/06/20  Yes Rigoberto Noel, MD  ALPRAZolam Duanne Moron) 1 MG tablet Take 1 mg by mouth 3 (three) times daily.   Yes [provider]  amphetamine-dextroamphetamine (ADDERALL) 20 MG tablet Take 20 mg by mouth in the morning, at noon, in the evening, and at bedtime.    Yes [provider]  Ascorbic Acid (VITAMIN C) 500 MG tablet Take 1,000 mg by mouth daily.   Yes [provider]  aspirin 81 MG tablet Take 1 tablet (81 mg total) by mouth daily. 05/21/19  Yes Costella, Vista Mink, PA-C  atorvastatin (LIPITOR) 20 MG tablet TAKE 1 TABLET BY MOUTH EVERY DAY IN THE EVENING Patient taking differently: Take 20 mg by mouth daily. 03/15/21  Yes Minus Breeding, MD  b complex vitamins tablet Take 1 tablet by mouth daily.   Yes [provider]  Calcium Citrate (CITRACAL PO) Take 2 tablets by mouth daily.   Yes [provider]  Cholecalciferol (VITAMIN D3) 5000 UNITS TABS Take 10,000 Units by mouth in the morning and at bedtime.   Yes [provider]  Chromium Picolinate 500 MCG TABS Take 500 mcg by mouth daily.    Yes [provider]  diclofenac Sodium (VOLTAREN) 1 % GEL Apply 2 g topically 3 (three) times daily as needed (knee pain).   Yes [provider]   furosemide (LASIX) 40 MG tablet Take 2 tablets (80 mg total) by mouth 2 (two) times daily. Patient taking differently: Take 40-80 mg by mouth See admin instructions. Take 80 mg in the morning and 40 mg in the evening 12/05/21  Yes [name], Jeneen Rinks, MD  [name] 1.5/30 1.5-30 MG-MCG tablet Take 1 tablet by mouth at bedtime. 08/03/19  Yes [provider]  levalbuterol (XOPENEX HFA) 45 MCG/ACT inhaler Inhale 2 puffs into the lungs every 6 (six) hours as needed for wheezing. 10/14/20  Yes Rigoberto Noel, MD  lidocaine (LIDODERM) 5 % PLACE 1 PATCH ONTO THE SKIN DAILY AS NEEDED FOR KNEE PAIN. REMOVE AND DISCARD PATCH WITHIN 12 HOURS OR AS DIRECTED BY DOCTOR. Patient taking differently: Place 1 patch onto the skin as needed (knee pain). 05/22/21  Yes Martinique, Betty G, MD  losartan (COZAAR) 25 MG tablet Take 1 tablet (25 mg total) by mouth 2 (two) times daily. 12/05/21  Yes [name], Jeneen Rinks, MD  LYRICA 300 MG capsule Take 300 mg by mouth 2 (two) times daily.  03/26/11  Yes [provider]  MAGNESIUM PO Take 500 mg by mouth daily.    Yes [provider]  Multiple Vitamin (MULTIVITAMIN) tablet Take 1 tablet by mouth daily.   Yes [provider]  NUEDEXTA 20-10 MG CAPS Take 1 capsule by mouth every 12 (twelve) hours.  09/27/16  Yes [provider]  Omega-3 Fatty Acids (FISH OIL) 1200 MG CAPS Take 2,400 mg by mouth  daily.    Yes [provider]  potassium chloride SA (KLOR-CON M20) 20 MEQ tablet Take 1 tablet (20 mEq total) by mouth daily. 04/14/21  Yes [name], Jeneen Rinks, MD  VRAYLAR 6 MG CAPS Take 6 mg by mouth every evening.  09/03/16  Yes [provider]  Zinc 50 MG CAPS Take 50 mg by mouth in the morning and at bedtime.   Yes [provider]  Blood Glucose Monitoring Suppl (ACCU-CHEK AVIVA PLUS) w/Device KIT As directed. 11/14/21   Martinique, Betty G, MD  methocarbamol (ROBAXIN-750) 750 MG tablet Take 1 tablet (750 mg total) by mouth 3 (three) times daily  as needed for muscle spasms. Patient not taking: Reported on 12/11/2021 05/17/19   Traci Sermon, PA-C     Vital Signs: BP (!) 153/88 (BP Location: Left Arm)   Pulse 90   Temp 97.8 F (36.6 C) (Oral)   Resp 18   Ht _0  (1.651 m)   Wt (!) 315 lb 14.7 oz (143.3 kg)   SpO2 91%   BMI 52.57 kg/m   Physical Exam NAD, alert  Abdomen:   Drain Location: RLQ #1 Size: Fr size: 12 Fr Date of placement: 11/21  Currently to: Drain collection device: gravity Drain with thin, cloudy fluid.  Flushes and aspirates some debris. Site intact.   Drain Location: RLQ #3 Size: Fr size: 10 Fr Date of placement: 11/21  Currently to: Drain collection device: gravity Drain with purulent vs. Feculent output. Flushes easily. Site with some bruising, minimal tenderness.   Drain Location: LLQ#2 Size: Fr size: 10 Fr Date of placement: 11/21  Currently to: Drain collection device: gravity Drain in place with cloudy output.  Flushes and aspirates easily.   Abdomen:   Drain Location: LLQ #4 Size: Fr size: 12 Fr Date of placement: 11/21  Currently to: Drain collection device: gravity Site intact. Feculent appearing output.  Flushes easily.  24 hour output:  Output by Drain (mL) 12/20/21 0701 - 12/20/21 1900 12/20/21 1901 - 12/21/21 0700 12/21/21 0701 - 12/21/21 1900 12/21/21 1901 - 12/22/21 0700 12/22/21 0701 - 12/22/21 1657  Closed System Drain 1 Right Abdomen Bulb (JP) 19 Fr. 10 10 2.5 6.5 0  Closed System Drain 1 Midline Abdomen Bulb (JP) 19 Fr. 10 10 0 5 0  Closed System Drain 1 Left LUQ Bulb (JP) 19 Fr. 0 10 2.5 6 0  Closed System Drain 1 Left LUQ Other (Comment) 0 5 0 15 0  Closed System Drain 2 Left;Anterior LLQ Other (Comment) 20 20 50 50 20  Closed System Drain 3 Right RUQ Other (Comment) 15 20 0 15 0  Closed System Drain 4 Right;Anterior RLQ Other (Comment) 20 20 50 50 100         Imaging: CT GUIDED PERITONEAL/RETROPERITONEAL FLUID DRAIN BY PERC CATH  Result Date:  12/20/2021 INDICATION: History of perforated abdominal viscus, post laparotomy on 12/11/2021 (Dr. Thermon Leyland) with postprocedural CT scan demonstrated development intraluminal fluid collections. As such, patient presents now for CT-guided aspiration and/or percutaneous drainage catheter placement. EXAM: CT-GUIDED PERCUTANEOUS CATHETER PLACEMENT X4 COMPARISON:  CT abdomen and pelvis-12/18/2021; 12/11/2021 MEDICATIONS: Zofran 4 mg IV; the patient is currently admitted to the hospital and receiving intravenous antibiotics. The antibiotics were administered within an appropriate time frame prior to the initiation of the procedure. ANESTHESIA/SEDATION: Moderate (conscious) sedation was employed during this procedure. A total of Versed 2 mg and Fentanyl 200 mcg was administered intravenously. Moderate Sedation Time: 70 minutes. The patient's level of consciousness  and vital signs were monitored continuously by radiology nursing throughout the procedure under my direct supervision. CONTRAST:  None COMPLICATIONS: None immediate. PROCEDURE: RADIATION DOSE REDUCTION: This exam was performed according to the departmental dose-optimization program which includes automated exposure control, adjustment of the mA and/or kV according to patient size and/or use of iterative reconstruction technique. Informed written consent was obtained from the patient after a discussion of the risks, benefits and alternatives to treatment. The patient was placed supine on the CT gantry and a pre procedural CT was performed re-demonstrating the known abscesses/fluid collections within the abdomen with dominant collection within the right upper abdominal quadrant measuring a proximally 6.8 x 3.5 cm (image 47, series 2), dominant collection within the left mid abdomen measuring 6.1 x 3.0 cm (59, series 2), dominant collection within the left lower abdomen/pelvis noted to contain enteric contrast and measuring approximately 8.5 x 5.4 cm (image 74,  series 2) and dominant collection with the right lower abdominal quadrant measuring 9.8 x 6.5 cm (image 76, series 2. CT gantry table positions were marked and the procedure was planned. A timeout was performed prior to the initiation of the procedure. The abdomen was prepped and draped in the usual sterile fashion. The overlying soft tissues were anesthetized with 1% lidocaine with epinephrine. Under intermittent CT guidance, each collection was targeted with an 18 gauge trocar needle ultimately allowing coiling of a short Amplatz wire within all collections. Appropriate position was confirmed with CT imaging. Next, tracks were serially dilated allowing placement of 10 French drainage catheters into the smaller collections within the right upper and left mid abdomen and 12 French drainage catheters into both the right and left lower abdominal/pelvic collections. At this time, approximately 300 cc of similar appearing feculent, foul-smelling fluid was aspirated from all of the collections with the majority of infection being aspirated from the 2 lower abdominal/pelvic drainage catheters, right-greater-than-left. All drainage catheters were flushed with saline and connected to gravity bags. All drainage catheters were secured at the skin entrance site within interrupted sutures. Dressings were applied. The patient tolerated the above procedures well without immediate postprocedural complication. IMPRESSION: 1. Successful placement of a 10 French drainage catheter into the right upper and left mid abdominal abscesses. 2. Successful placement of 12 French drainage catheters into right and left lower abdominal/pelvic abscesses. Enteric contrast is seen within the left lower abdominal/pelvic collection suggesting the adjacent sigmoid colon likely serves as the etiology of the patient's enteric perforation. 3. A total of 300 cc of similar appearing feculent, foul-smelling fluid was aspirated from all drainage catheters  with the majority of infection being aspirated from the two lower abdominal/pelvic drainage catheters, right-greater-than-left. Electronically Signed   By: Sandi Mariscal M.D.   On: 12/20/2021 14:55   CT GUIDED PERITONEAL/RETROPERITONEAL FLUID DRAIN BY PERC CATH  Result Date: 12/20/2021 INDICATION: History of perforated abdominal viscus, post laparotomy on 12/11/2021 (Dr. Thermon Leyland) with postprocedural CT scan demonstrated development intraluminal fluid collections. As such, patient presents now for CT-guided aspiration and/or percutaneous drainage catheter placement. EXAM: CT-GUIDED PERCUTANEOUS CATHETER PLACEMENT X4 COMPARISON:  CT abdomen and pelvis-12/18/2021; 12/11/2021 MEDICATIONS: Zofran 4 mg IV; the patient is currently admitted to the hospital and receiving intravenous antibiotics. The antibiotics were administered within an appropriate time frame prior to the initiation of the procedure. ANESTHESIA/SEDATION: Moderate (conscious) sedation was employed during this procedure. A total of Versed 2 mg and Fentanyl 200 mcg was administered intravenously. Moderate Sedation Time: 70 minutes. The patient's level of consciousness and vital signs  were monitored continuously by radiology nursing throughout the procedure under my direct supervision. CONTRAST:  None COMPLICATIONS: None immediate. PROCEDURE: RADIATION DOSE REDUCTION: This exam was performed according to the departmental dose-optimization program which includes automated exposure control, adjustment of the mA and/or kV according to patient size and/or use of iterative reconstruction technique. Informed written consent was obtained from the patient after a discussion of the risks, benefits and alternatives to treatment. The patient was placed supine on the CT gantry and a pre procedural CT was performed re-demonstrating the known abscesses/fluid collections within the abdomen with dominant collection within the right upper abdominal quadrant measuring  a proximally 6.8 x 3.5 cm (image 47, series 2), dominant collection within the left mid abdomen measuring 6.1 x 3.0 cm (59, series 2), dominant collection within the left lower abdomen/pelvis noted to contain enteric contrast and measuring approximately 8.5 x 5.4 cm (image 74, series 2) and dominant collection with the right lower abdominal quadrant measuring 9.8 x 6.5 cm (image 76, series 2. CT gantry table positions were marked and the procedure was planned. A timeout was performed prior to the initiation of the procedure. The abdomen was prepped and draped in the usual sterile fashion. The overlying soft tissues were anesthetized with 1% lidocaine with epinephrine. Under intermittent CT guidance, each collection was targeted with an 18 gauge trocar needle ultimately allowing coiling of a short Amplatz wire within all collections. Appropriate position was confirmed with CT imaging. Next, tracks were serially dilated allowing placement of 10 French drainage catheters into the smaller collections within the right upper and left mid abdomen and 12 French drainage catheters into both the right and left lower abdominal/pelvic collections. At this time, approximately 300 cc of similar appearing feculent, foul-smelling fluid was aspirated from all of the collections with the majority of infection being aspirated from the 2 lower abdominal/pelvic drainage catheters, right-greater-than-left. All drainage catheters were flushed with saline and connected to gravity bags. All drainage catheters were secured at the skin entrance site within interrupted sutures. Dressings were applied. The patient tolerated the above procedures well without immediate postprocedural complication. IMPRESSION: 1. Successful placement of a 10 French drainage catheter into the right upper and left mid abdominal abscesses. 2. Successful placement of 12 French drainage catheters into right and left lower abdominal/pelvic abscesses. Enteric contrast is  seen within the left lower abdominal/pelvic collection suggesting the adjacent sigmoid colon likely serves as the etiology of the patient's enteric perforation. 3. A total of 300 cc of similar appearing feculent, foul-smelling fluid was aspirated from all drainage catheters with the majority of infection being aspirated from the two lower abdominal/pelvic drainage catheters, right-greater-than-left. Electronically Signed   By: Sandi Mariscal M.D.   On: 12/20/2021 14:55   CT GUIDED PERITONEAL/RETROPERITONEAL FLUID DRAIN BY PERC CATH  Result Date: 12/20/2021 INDICATION: History of perforated abdominal viscus, post laparotomy on 12/11/2021 (Dr. Thermon Leyland) with postprocedural CT scan demonstrated development intraluminal fluid collections. As such, patient presents now for CT-guided aspiration and/or percutaneous drainage catheter placement. EXAM: CT-GUIDED PERCUTANEOUS CATHETER PLACEMENT X4 COMPARISON:  CT abdomen and pelvis-12/18/2021; 12/11/2021 MEDICATIONS: Zofran 4 mg IV; the patient is currently admitted to the hospital and receiving intravenous antibiotics. The antibiotics were administered within an appropriate time frame prior to the initiation of the procedure. ANESTHESIA/SEDATION: Moderate (conscious) sedation was employed during this procedure. A total of Versed 2 mg and Fentanyl 200 mcg was administered intravenously. Moderate Sedation Time: 70 minutes. The patient's level of consciousness and vital signs were monitored continuously  by radiology nursing throughout the procedure under my direct supervision. CONTRAST:  None COMPLICATIONS: None immediate. PROCEDURE: RADIATION DOSE REDUCTION: This exam was performed according to the departmental dose-optimization program which includes automated exposure control, adjustment of the mA and/or kV according to patient size and/or use of iterative reconstruction technique. Informed written consent was obtained from the patient after a discussion of the risks,  benefits and alternatives to treatment. The patient was placed supine on the CT gantry and a pre procedural CT was performed re-demonstrating the known abscesses/fluid collections within the abdomen with dominant collection within the right upper abdominal quadrant measuring a proximally 6.8 x 3.5 cm (image 47, series 2), dominant collection within the left mid abdomen measuring 6.1 x 3.0 cm (59, series 2), dominant collection within the left lower abdomen/pelvis noted to contain enteric contrast and measuring approximately 8.5 x 5.4 cm (image 74, series 2) and dominant collection with the right lower abdominal quadrant measuring 9.8 x 6.5 cm (image 76, series 2. CT gantry table positions were marked and the procedure was planned. A timeout was performed prior to the initiation of the procedure. The abdomen was prepped and draped in the usual sterile fashion. The overlying soft tissues were anesthetized with 1% lidocaine with epinephrine. Under intermittent CT guidance, each collection was targeted with an 18 gauge trocar needle ultimately allowing coiling of a short Amplatz wire within all collections. Appropriate position was confirmed with CT imaging. Next, tracks were serially dilated allowing placement of 10 French drainage catheters into the smaller collections within the right upper and left mid abdomen and 12 French drainage catheters into both the right and left lower abdominal/pelvic collections. At this time, approximately 300 cc of similar appearing feculent, foul-smelling fluid was aspirated from all of the collections with the majority of infection being aspirated from the 2 lower abdominal/pelvic drainage catheters, right-greater-than-left. All drainage catheters were flushed with saline and connected to gravity bags. All drainage catheters were secured at the skin entrance site within interrupted sutures. Dressings were applied. The patient tolerated the above procedures well without immediate  postprocedural complication. IMPRESSION: 1. Successful placement of a 10 French drainage catheter into the right upper and left mid abdominal abscesses. 2. Successful placement of 12 French drainage catheters into right and left lower abdominal/pelvic abscesses. Enteric contrast is seen within the left lower abdominal/pelvic collection suggesting the adjacent sigmoid colon likely serves as the etiology of the patient's enteric perforation. 3. A total of 300 cc of similar appearing feculent, foul-smelling fluid was aspirated from all drainage catheters with the majority of infection being aspirated from the two lower abdominal/pelvic drainage catheters, right-greater-than-left. Electronically Signed   By: Sandi Mariscal M.D.   On: 12/20/2021 14:55   CT GUIDED PERITONEAL/RETROPERITONEAL FLUID DRAIN BY PERC CATH  Result Date: 12/20/2021 INDICATION: History of perforated abdominal viscus, post laparotomy on 12/11/2021 (Dr. Thermon Leyland) with postprocedural CT scan demonstrated development intraluminal fluid collections. As such, patient presents now for CT-guided aspiration and/or percutaneous drainage catheter placement. EXAM: CT-GUIDED PERCUTANEOUS CATHETER PLACEMENT X4 COMPARISON:  CT abdomen and pelvis-12/18/2021; 12/11/2021 MEDICATIONS: Zofran 4 mg IV; the patient is currently admitted to the hospital and receiving intravenous antibiotics. The antibiotics were administered within an appropriate time frame prior to the initiation of the procedure. ANESTHESIA/SEDATION: Moderate (conscious) sedation was employed during this procedure. A total of Versed 2 mg and Fentanyl 200 mcg was administered intravenously. Moderate Sedation Time: 70 minutes. The patient's level of consciousness and vital signs were monitored continuously by radiology nursing  throughout the procedure under my direct supervision. CONTRAST:  None COMPLICATIONS: None immediate. PROCEDURE: RADIATION DOSE REDUCTION: This exam was performed according to  the departmental dose-optimization program which includes automated exposure control, adjustment of the mA and/or kV according to patient size and/or use of iterative reconstruction technique. Informed written consent was obtained from the patient after a discussion of the risks, benefits and alternatives to treatment. The patient was placed supine on the CT gantry and a pre procedural CT was performed re-demonstrating the known abscesses/fluid collections within the abdomen with dominant collection within the right upper abdominal quadrant measuring a proximally 6.8 x 3.5 cm (image 47, series 2), dominant collection within the left mid abdomen measuring 6.1 x 3.0 cm (59, series 2), dominant collection within the left lower abdomen/pelvis noted to contain enteric contrast and measuring approximately 8.5 x 5.4 cm (image 74, series 2) and dominant collection with the right lower abdominal quadrant measuring 9.8 x 6.5 cm (image 76, series 2. CT gantry table positions were marked and the procedure was planned. A timeout was performed prior to the initiation of the procedure. The abdomen was prepped and draped in the usual sterile fashion. The overlying soft tissues were anesthetized with 1% lidocaine with epinephrine. Under intermittent CT guidance, each collection was targeted with an 18 gauge trocar needle ultimately allowing coiling of a short Amplatz wire within all collections. Appropriate position was confirmed with CT imaging. Next, tracks were serially dilated allowing placement of 10 French drainage catheters into the smaller collections within the right upper and left mid abdomen and 12 French drainage catheters into both the right and left lower abdominal/pelvic collections. At this time, approximately 300 cc of similar appearing feculent, foul-smelling fluid was aspirated from all of the collections with the majority of infection being aspirated from the 2 lower abdominal/pelvic drainage catheters,  right-greater-than-left. All drainage catheters were flushed with saline and connected to gravity bags. All drainage catheters were secured at the skin entrance site within interrupted sutures. Dressings were applied. The patient tolerated the above procedures well without immediate postprocedural complication. IMPRESSION: 1. Successful placement of a 10 French drainage catheter into the right upper and left mid abdominal abscesses. 2. Successful placement of 12 French drainage catheters into right and left lower abdominal/pelvic abscesses. Enteric contrast is seen within the left lower abdominal/pelvic collection suggesting the adjacent sigmoid colon likely serves as the etiology of the patient's enteric perforation. 3. A total of 300 cc of similar appearing feculent, foul-smelling fluid was aspirated from all drainage catheters with the majority of infection being aspirated from the two lower abdominal/pelvic drainage catheters, right-greater-than-left. Electronically Signed   By: Sandi Mariscal M.D.   On: 12/20/2021 14:55    Labs:  CBC: Recent Labs    12/20/21 0416 12/20/21 2333 12/21/21 0752 12/22/21 0320  WBC 12.3* 12.1* 11.6* 11.8*  HGB 11.6* 11.8* 12.1 11.0*  HCT 35.8* 37.3 36.6 34.6*  PLT 556* 596* 550* 560*    COAGS: No results for input(s): "INR", "APTT" in the last 8760 hours.  BMP: Recent Labs    12/19/21 0511 12/20/21 0416 12/20/21 2333 12/22/21 0320  NA 142 141 139 141  K 4.4 3.7 4.2 4.0  CL 103 101 101 100  CO2 _0 GLUCOSE 153* 129* 137* 151*  BUN _1 CALCIUM 8.7* 8.7* 8.6* 8.9  CREATININE 0.77 0.79 0.77 0.72  GFRNONAA >60 >60 >60 >60    LIVER FUNCTION TESTS: Recent Labs  11/14/21 1606 12/11/21 1625  BILITOT 0.3 0.5  AST 37 33  ALT 34 21  ALKPHOS 49 109  PROT 7.0 6.0*  ALBUMIN 4.1 2.0*    Assessment and Plan: Multiple intra-abdominal fluid collections s/p drain placement x4 Patient with flat affect, no complaints at visit.  Drains with  ongoing output.  Left-sided drains with feculent output.  Right sided drains feculent vs. Purulent.  WBC stable, 11.2 Afebrile.  Plan: Continue TID flushes with 5 cc NS. Record output Q shift. Dressing changes QD or PRN if soiled.  Call IR APP or on call IR MD if difficulty flushing or sudden change in drain output.  Repeat imaging/possible drain injection once output < 10 mL/QD (excluding flush material). Consideration for drain removal if output is < 10 mL/QD (excluding flush material), pending discussion with the providing surgical service.   Electronically Signed: Docia Barrier, PA 12/22/2021, 4:50 PM   I spent a total of 15 Minutes at the the patient's bedside AND on the patient's hospital floor or unit, greater than 50% of which was counseling/coordinating care for intra-abdominal fluid collections.

## 2021-12-22 NOTE — Progress Notes (Signed)
Physical Therapy Treatment Patient Details Name: [name] MRN: [id number] DOB: 1970-07-10 Today's Date: 12/22/2021   History of Present Illness 51 y.o. female presents to [facility] hospital on 12/11/2021 with abdominal pain, found to have perforated sigmoid diverticulitis. Pt underwent laparoscopy with washout and drain placement on 11/13. Pt extubated 11/14. PMH includes OSA, systolic HF, anxiety, OA, asthma, bipolar disorder, depression.    PT Comments    Continuing work on functional mobility and activity tolerance;  Session intended for progressive amb, but pt politely declines, stating too fatigued; Agreed to stand, and take a few steps up towards [facility] to be in a better positioning in the bed to lay down  Recommendations for follow up therapy are one component of a multi-disciplinary discharge planning process, led by the attending physician.  Recommendations may be updated based on patient status, additional functional criteria and insurance authorization.  Follow Up Recommendations  Home health PT     Assistance Recommended at Discharge Intermittent Supervision/Assistance  Patient can return home with the following A little help with walking and/or transfers;A little help with bathing/dressing/bathroom;Assistance with cooking/housework;Assist for transportation;Help with stairs or ramp for entrance   Equipment Recommendations  Rolling walker (2 wheels)    Recommendations for Other Services       Precautions / Restrictions Precautions Precautions: Fall     Mobility  Bed Mobility Overal bed mobility: Needs Assistance Bed Mobility: Supine to Sit, Sit to Supine     Supine to sit: Min assist Sit to supine: Min assist   General bed mobility comments: assist to lift trunk then assist to return legs to bed    Transfers Overall transfer level: Needs assistance Equipment used: Rolling walker (2 wheels) Transfers: Sit to/from Stand Sit to Stand: Min assist            General transfer comment: min assist to power up and steady as pt moved hands up to rW    Ambulation/Gait Ambulation/Gait assistance: Min guard Gait Distance (Feet):  (sidesteps towards [facility]) Assistive device: Rolling walker (2 wheels)         General Gait Details: Cues to self-monitor for activity tolerance    Stairs             Wheelchair Mobility    Modified Rankin (Stroke Patients Only)       Balance     Sitting balance-Leahy Scale: Good       Standing balance-Leahy Scale: Fair                              Cognition Arousal/Alertness: Awake/alert Behavior During Therapy: WFL for tasks assessed/performed Overall Cognitive Status: Impaired/Different from baseline                               Problem Solving: Slow processing, Requires verbal cues General Comments: pt is a bit self limiting        Exercises      General Comments General comments (skin integrity, edema, etc.): Offered pt more O2 tubing length      Pertinent Vitals/Pain Pain Assessment Pain Assessment: No/denies pain Pain Intervention(s): Monitored during session    Home Living                          Prior Function            PT Goals (current  goals can now be found in the care plan section) Acute Rehab PT Goals Patient Stated Goal: be independent again PT Goal Formulation: With patient Time For Goal Achievement: 12/27/21 Potential to Achieve Goals: Good Progress towards PT goals: Progressing toward goals (slowly)    Frequency    Min 3X/week      PT Plan Current plan remains appropriate    Co-evaluation              AM-PAC PT "6 Clicks" Mobility   Outcome Measure  Help needed turning from your back to your side while in a flat bed without using bedrails?: A Little Help needed moving from lying on your back to sitting on the side of a flat bed without using bedrails?: A Little Help needed moving to and from a bed  to a chair (including a wheelchair)?: A Little Help needed standing up from a chair using your arms (e.g., wheelchair or bedside chair)?: A Little Help needed to walk in hospital room?: A Little Help needed climbing 3-5 steps with a railing? : A Lot 6 Click Score: 17    End of Session Equipment Utilized During Treatment: Oxygen Activity Tolerance: Patient tolerated treatment well;Patient limited by fatigue Patient left: in bed;with call bell/phone within reach;with bed alarm set Nurse Communication: Mobility status PT Visit Diagnosis: Other abnormalities of gait and mobility (R26.89);Muscle weakness (generalized) (M62.81);Pain Pain - part of body:  (abdomen)     Time: 0981-1914 PT Time Calculation (min) (ACUTE ONLY): 14 min  Charges:  $Therapeutic Activity: 8-22 mins                     Roney Marion, PT  Acute Rehabilitation Services Office [phone]    Colletta [place] 12/22/2021, 5:05 PM

## 2021-12-23 ENCOUNTER — Inpatient Hospital Stay: Payer: Self-pay

## 2021-12-23 DIAGNOSIS — K668 Other specified disorders of peritoneum: Secondary | ICD-10-CM | POA: Diagnosis not present

## 2021-12-23 LAB — GLUCOSE, CAPILLARY
Glucose-Capillary: 105 mg/dL — ABNORMAL HIGH (ref 70–99)
Glucose-Capillary: 110 mg/dL — ABNORMAL HIGH (ref 70–99)
Glucose-Capillary: 117 mg/dL — ABNORMAL HIGH (ref 70–99)
Glucose-Capillary: 126 mg/dL — ABNORMAL HIGH (ref 70–99)
Glucose-Capillary: 132 mg/dL — ABNORMAL HIGH (ref 70–99)
Glucose-Capillary: 133 mg/dL — ABNORMAL HIGH (ref 70–99)
Glucose-Capillary: 176 mg/dL — ABNORMAL HIGH (ref 70–99)

## 2021-12-23 LAB — BASIC METABOLIC PANEL
Anion gap: 13 (ref 5–15)
BUN: 8 mg/dL (ref 6–20)
CO2: 28 mmol/L (ref 22–32)
Calcium: 8.9 mg/dL (ref 8.9–10.3)
Chloride: 101 mmol/L (ref 98–111)
Creatinine, Ser: 0.6 mg/dL (ref 0.44–1.00)
GFR, Estimated: >60 mL/min (ref >60)
Glucose, Bld: 123 mg/dL — ABNORMAL HIGH (ref 70–99)
Potassium: 4.7 mmol/L (ref 3.5–5.1)
Sodium: 142 mmol/L (ref 135–145)

## 2021-12-23 LAB — HEPATIC FUNCTION PANEL
ALT: 14 U/L (ref 0–44)
AST: 25 U/L (ref 15–41)
Albumin: 1.7 g/dL — ABNORMAL LOW (ref 3.5–5.0)
Alkaline Phosphatase: 89 U/L (ref 38–126)
Bilirubin, Direct: <0.1 mg/dL (ref 0.0–0.2)
Total Bilirubin: 0.3 mg/dL (ref 0.3–1.2)
Total Protein: 5.7 g/dL — ABNORMAL LOW (ref 6.5–8.1)

## 2021-12-23 LAB — CBC
HCT: 33.9 % — ABNORMAL LOW (ref 36.0–46.0)
Hemoglobin: 11 g/dL — ABNORMAL LOW (ref 12.0–15.0)
MCH: 29.7 pg (ref 26.0–34.0)
MCHC: 32.4 g/dL (ref 30.0–36.0)
MCV: 91.6 fL (ref 80.0–100.0)
Platelets: 705 10*3/uL — ABNORMAL HIGH (ref 150–400)
RBC: 3.7 MIL/uL — ABNORMAL LOW (ref 3.87–5.11)
RDW: 15.1 % (ref 11.5–15.5)
WBC: 12.1 10*3/uL — ABNORMAL HIGH (ref 4.0–10.5)
nRBC: 0 % (ref 0.0–0.2)

## 2021-12-23 MED ORDER — FLUCONAZOLE IN SODIUM CHLORIDE 400-0.9 MG/200ML-% IV SOLN
800.0000 mg | Freq: Once | INTRAVENOUS | Status: AC
  Administered 2021-12-24: 800 mg via INTRAVENOUS
  Filled 2021-12-23 (×2): qty 400

## 2021-12-23 MED ORDER — FLUCONAZOLE IN SODIUM CHLORIDE 400-0.9 MG/200ML-% IV SOLN
400.0000 mg | INTRAVENOUS | Status: DC
  Administered 2021-12-25 – 2022-01-02 (×9): 400 mg via INTRAVENOUS
  Filled 2021-12-23 (×9): qty 200

## 2021-12-23 MED ORDER — SODIUM CHLORIDE 0.9% FLUSH
10.0000 mL | INTRAVENOUS | Status: DC | PRN
  Administered 2021-12-29 – 2022-01-04 (×3): 10 mL

## 2021-12-23 MED ORDER — LACTATED RINGERS IV SOLN: INTRAVENOUS | Status: AC

## 2021-12-23 MED ORDER — TRAVASOL 10 % IV SOLN
INTRAVENOUS | Status: AC
  Filled 2021-12-23: qty 648

## 2021-12-23 MED ORDER — CHLORHEXIDINE GLUCONATE CLOTH 2 % EX PADS
6.0000 | MEDICATED_PAD | Freq: Every day | CUTANEOUS | Status: DC
  Administered 2021-12-23 – 2022-01-05 (×14): 6 via TOPICAL

## 2021-12-23 NOTE — Progress Notes (Signed)
Peripherally Inserted Central Catheter Placement  The IV Nurse has discussed with the patient and/or persons authorized to consent for the patient, the purpose of this procedure and the potential benefits and risks involved with this procedure.  The benefits include less needle sticks, lab draws from the catheter, and the patient may be discharged home with the catheter. Risks include, but not limited to, infection, bleeding, blood clot (thrombus formation), and puncture of an artery; nerve damage and irregular heartbeat and possibility to perform a PICC exchange if needed/ordered by physician.  Alternatives to this procedure were also discussed.  Bard Power PICC patient education guide, fact sheet on infection prevention and patient information card has been provided to patient /or left at bedside.    PICC Placement Documentation  PICC Double Lumen 99/35/70 Right Basilic 44 cm 0 cm (Active)  Indication for Insertion or Continuance of Line Administration of hyperosmolar/irritating solutions (i.e. TPN, Vancomycin, etc.) 12/23/21 1101  Exposed Catheter (cm) 0 cm 12/23/21 1101  Site Assessment Clean, Dry, Intact 12/23/21 1101  Lumen #1 Status Flushed;Saline locked;Blood return noted 12/23/21 1101  Lumen #2 Status Flushed;Saline locked;Blood return noted 12/23/21 1101  Dressing Type Transparent;Securing device 12/23/21 1101  Dressing Status Antimicrobial disc in place 12/23/21 1101  Dressing Intervention New dressing;Other (Comment) 12/23/21 1101  Dressing Change Due 12/30/21 12/23/21 1101   POA  [place] sister in Sports coach signed telephone consent    Synthia Innocent 12/23/2021, 11:02 AM

## 2021-12-23 NOTE — Progress Notes (Signed)
Pt placed on home CPAP resting comfortably at this time. Will continue to monitor.

## 2021-12-23 NOTE — Progress Notes (Signed)
Pharmacy Antibiotic Note  [name] is a 51 y.o. female admitted on 12/11/2021 with intra-abdominal abscess s/p peritoneal lavage with drain placement. Patient has been receiving Zosyn therapy since 11/13 for IAI and bacteremia. Repeat CT scan on 11/20 showed multiple abscesses. IR placed new drains on 11/21. Pharmacy has been consulted for Zosyn dosing. Drain cultures from 11/21 showing rare budding yeast - pharmacy consulted to add micafungin.   Drain culture from 11/21 identified yeast as Candida albicans, which is sensitive to micafungin. Per discussion with Dr. [place] will de-escalate micafungin to fluconazole therapy, pharmacy to dose.   WBC 12.1, afebrile SCr 0.6 Total output from abdominal drains: 265 mL/24h (up from 11/23)  Plan: Continue Zosyn 3.375g IV q8h (4 hour infusion). Discontinue micafungin Initiate Fluconazole '800mg'$  IV x1 on 11/26, followed by '400mg'$  IV q24h  Monitor daily CBC, temp, SCr, and for clinical signs of improvement  Monitor baseline QTc with ECG today and every 72h to assess for QT-prolongation on high-dose fluconazole F/u cultures and de-escalate antibiotics as able    Height: '5\' 5"'$  (165.1 cm) Weight: (!) 143.3 kg (315 lb 14.7 oz) IBW/kg (Calculated) : 57  Temp (24hrs), Avg:98.3 F (36.8 C), Min:98.1 F (36.7 C), Max:98.5 F (36.9 C)  Recent Labs  Lab 12/19/21 0511 12/20/21 0416 12/20/21 2333 12/21/21 0752 12/22/21 0320 12/23/21 0139  WBC 12.9* 12.3* 12.1* 11.6* 11.8* 12.1*  CREATININE 0.77 0.79 0.77  --  0.72 0.60     Estimated Creatinine Clearance: 118.8 mL/min (by C-G formula based on SCr of 0.6 mg/dL).    Allergies  Allergen Reactions   Savella [Milnacipran Hcl] Other (See Comments)    mania   Antimicrobials this admission:  Cefepime/Flagyl x1 11/13 Zosyn 11/13 >>  Micafungin 11/22 >> 11/25 Fluconazole 11/26 >>   Dose adjustments this admission:   Microbiology results:  11/13 blood: 2/4 bottles Bacteroides  thetaiotaomicron, beta-lactamase pos, no BCID run 11/13 peritoneal fluid: E coli (R-amp only), abundant Bacteroides thetaiotaomicron - final 11/13 fungus peritoneal fluid: none observed, final pending 11/13 MRSA PCR: neg 11/21 abscess drain: gram stain w/ abundant Bacteroides (not fragilis, beta-lactamase+), few Clostridium perfringens, abundant Candida albicans (final)   Thank you for allowing pharmacy to be a part of this patient's care.  Luisa Hart, PharmD, BCPS Clinical Pharmacist 12/23/2021 11:04 AM   Please refer to AMION for pharmacy phone number

## 2021-12-23 NOTE — Progress Notes (Signed)
Patient ID: [name], female   DOB: [birth year], 51 y.o.   MRN: [id number] [facility] Progress Note:   12 Days Post-Op   THE PLAN  Will likely need diverting colostomy this week TNA and NPO for now  Subjective: Mental status is alert.  Complaints none reported. Objective: Vital signs in last 24 hours: Temp:  [97.8 F (36.6 C)-98.5 F (36.9 C)] 98.1 F (36.7 C) (11/25 0515) Pulse Rate:  [84-90] 84 (11/24 2240) Resp:  [18-20] 18 (11/25 0515) BP: (137-153)/(73-88) 152/76 (11/25 0515) SpO2:  [90 %-97 %] 90 % (11/25 0515)  Intake/Output from previous day: 11/24 0701 - 11/25 0700 In: 460 [P.O.:300; I.V.:5; IV Piggyback:100] Out: 223 [Drains:223] Intake/Output this shift: No intake/output data recorded.  Physical Exam: Work of breathing is not labored.  Difficulty with her home CPAP mask.  Drains in place  Lab Results:  Results for orders placed or performed during the hospital encounter of 12/11/21 (from the past 48 hour(s))  Glucose, capillary     Status: Abnormal   Collection Time: 12/21/21 11:42 AM  Result Value Ref Range   Glucose-Capillary 119 (H) 70 - 99 mg/dL    Comment: Glucose reference range applies only to samples taken after fasting for at least 8 hours.  Glucose, capillary     Status: Abnormal   Collection Time: 12/21/21  3:29 PM  Result Value Ref Range   Glucose-Capillary 106 (H) 70 - 99 mg/dL    Comment: Glucose reference range applies only to samples taken after fasting for at least 8 hours.  Glucose, capillary     Status: Abnormal   Collection Time: 12/21/21  4:48 PM  Result Value Ref Range   Glucose-Capillary 114 (H) 70 - 99 mg/dL    Comment: Glucose reference range applies only to samples taken after fasting for at least 8 hours.  Glucose, capillary     Status: Abnormal   Collection Time: 12/21/21  8:42 PM  Result Value Ref Range   Glucose-Capillary 149 (H) 70 - 99 mg/dL    Comment: Glucose reference range applies only to samples  taken after fasting for at least 8 hours.  Glucose, capillary     Status: Abnormal   Collection Time: 12/22/21  2:17 AM  Result Value Ref Range   Glucose-Capillary 123 (H) 70 - 99 mg/dL    Comment: Glucose reference range applies only to samples taken after fasting for at least 8 hours.  CBC     Status: Abnormal   Collection Time: 12/22/21  3:20 AM  Result Value Ref Range   WBC 11.8 (H) 4.0 - 10.5 K/uL   RBC 3.69 (L) 3.87 - 5.11 MIL/uL   Hemoglobin 11.0 (L) 12.0 - 15.0 g/dL   HCT 34.6 (L) 36.0 - 46.0 %   MCV 93.8 80.0 - 100.0 fL   MCH 29.8 26.0 - 34.0 pg   MCHC 31.8 30.0 - 36.0 g/dL   RDW 15.1 11.5 - 15.5 %   Platelets 560 (H) 150 - 400 K/uL   nRBC 0.0 0.0 - 0.2 %    Comment: Performed at [facility] [address], [place], [place] [address]  Basic metabolic panel     Status: Abnormal   Collection Time: 12/22/21  3:20 AM  Result Value Ref Range   Sodium 141 135 - 145 mmol/L   Potassium 4.0 3.5 - 5.1 mmol/L   Chloride 100 98 - 111 mmol/L   CO2 26 22 - 32 mmol/L   Glucose,  Bld 151 (H) 70 - 99 mg/dL    Comment: Glucose reference range applies only to samples taken after fasting for at least 8 hours.   BUN 8 6 - 20 mg/dL   Creatinine, Ser 0.72 0.44 - 1.00 mg/dL   Calcium 8.9 8.9 - 10.3 mg/dL   GFR, Estimated >60 >60 mL/min    Comment: (NOTE) Calculated using the CKD-EPI Creatinine Equation (2021)    Anion gap 15 5 - 15    Comment: Performed at [facility] [address] [place], [place] [address]  Magnesium     Status: None   Collection Time: 12/22/21  3:20 AM  Result Value Ref Range   Magnesium 1.8 1.7 - 2.4 mg/dL    Comment: Performed at [facility] [address] [place], [place] [address]  Glucose, capillary     Status: Abnormal   Collection Time: 12/22/21  4:42 AM  Result Value Ref Range   Glucose-Capillary 114 (H) 70 - 99 mg/dL    Comment: Glucose reference range applies only to samples taken after fasting for at least 8 hours.  Glucose,  capillary     Status: Abnormal   Collection Time: 12/22/21  8:22 AM  Result Value Ref Range   Glucose-Capillary 119 (H) 70 - 99 mg/dL    Comment: Glucose reference range applies only to samples taken after fasting for at least 8 hours.  Glucose, capillary     Status: Abnormal   Collection Time: 12/22/21 11:55 AM  Result Value Ref Range   Glucose-Capillary 127 (H) 70 - 99 mg/dL    Comment: Glucose reference range applies only to samples taken after fasting for at least 8 hours.  Glucose, capillary     Status: Abnormal   Collection Time: 12/22/21  4:10 PM  Result Value Ref Range   Glucose-Capillary 134 (H) 70 - 99 mg/dL    Comment: Glucose reference range applies only to samples taken after fasting for at least 8 hours.  Glucose, capillary     Status: Abnormal   Collection Time: 12/22/21  9:54 PM  Result Value Ref Range   Glucose-Capillary 151 (H) 70 - 99 mg/dL    Comment: Glucose reference range applies only to samples taken after fasting for at least 8 hours.  Glucose, capillary     Status: Abnormal   Collection Time: 12/23/21 12:11 AM  Result Value Ref Range   Glucose-Capillary 117 (H) 70 - 99 mg/dL    Comment: Glucose reference range applies only to samples taken after fasting for at least 8 hours.  CBC     Status: Abnormal   Collection Time: 12/23/21  1:39 AM  Result Value Ref Range   WBC 12.1 (H) 4.0 - 10.5 K/uL   RBC 3.70 (L) 3.87 - 5.11 MIL/uL   Hemoglobin 11.0 (L) 12.0 - 15.0 g/dL   HCT 33.9 (L) 36.0 - 46.0 %   MCV 91.6 80.0 - 100.0 fL   MCH 29.7 26.0 - 34.0 pg   MCHC 32.4 30.0 - 36.0 g/dL   RDW 15.1 11.5 - 15.5 %   Platelets 705 (H) 150 - 400 K/uL   nRBC 0.0 0.0 - 0.2 %    Comment: Performed at [facility] [address], [place], [place] [address]  Basic metabolic panel     Status: Abnormal   Collection Time: 12/23/21  1:39 AM  Result Value Ref Range   Sodium 142 135 - 145 mmol/L   Potassium 4.7 3.5 -  5.1 mmol/L    Comment: HEMOLYSIS AT THIS LEVEL MAY  AFFECT RESULT   Chloride 101 98 - 111 mmol/L   CO2 28 22 - 32 mmol/L   Glucose, Bld 123 (H) 70 - 99 mg/dL    Comment: Glucose reference range applies only to samples taken after fasting for at least 8 hours.   BUN 8 6 - 20 mg/dL   Creatinine, Ser 0.60 0.44 - 1.00 mg/dL   Calcium 8.9 8.9 - 10.3 mg/dL   GFR, Estimated >60 >60 mL/min    Comment: (NOTE) Calculated using the CKD-EPI Creatinine Equation (2021)    Anion gap 13 5 - 15    Comment: Performed at [facility] [address], [place], [place] [address]  Glucose, capillary     Status: Abnormal   Collection Time: 12/23/21  5:03 AM  Result Value Ref Range   Glucose-Capillary 126 (H) 70 - 99 mg/dL    Comment: Glucose reference range applies only to samples taken after fasting for at least 8 hours.  Glucose, capillary     Status: Abnormal   Collection Time: 12/23/21  7:21 AM  Result Value Ref Range   Glucose-Capillary 132 (H) 70 - 99 mg/dL    Comment: Glucose reference range applies only to samples taken after fasting for at least 8 hours.    Radiology/Results: [place] EKG SITE RITE  Result Date: 12/23/2021 If Site Rite image not attached, placement could not be confirmed due to current cardiac rhythm.   Anti-infectives: Anti-infectives (From admission, onward)    Start     Dose/Rate Route Frequency Ordered Stop   12/20/21 1000  micafungin (MYCAMINE) 150 mg in sodium chloride 0.9 % 100 mL IVPB        150 mg 107.5 mL/hr over 1 Hours Intravenous Every 24 hours 12/20/21 0904     12/12/21 0600  ceFEPIme (MAXIPIME) 2 g in sodium chloride 0.9 % 100 mL IVPB  Status:  Discontinued        2 g 200 mL/hr over 30 Minutes Intravenous Every 12 hours 12/11/21 2129 12/11/21 2323   12/12/21 0600  piperacillin-tazobactam (ZOSYN) IVPB 3.375 g        3.375 g 12.5 mL/hr over 240 Minutes Intravenous Every 8 hours 12/11/21 2326     12/11/21 2330  piperacillin-tazobactam (ZOSYN) IVPB 3.375 g        3.375 g 100 mL/hr over 30 Minutes  Intravenous To Post Anesthesia Care Unit 12/11/21 2326 12/12/21 0041   12/11/21 1630  metroNIDAZOLE (FLAGYL) IVPB 500 mg        500 mg 100 mL/hr over 60 Minutes Intravenous  Once 12/11/21 1619 12/11/21 2049   12/11/21 1630  ceFEPIme (MAXIPIME) 2 g in sodium chloride 0.9 % 100 mL IVPB        2 g 200 mL/hr over 30 Minutes Intravenous  Once 12/11/21 1623 12/11/21 1817       Assessment/Plan: Problem List: Patient Active Problem List   Diagnosis Date Noted   Perforation of sigmoid colon due to diverticulitis 12/13/2021   Colitis 12/12/2021   Pneumoperitoneum 12/12/2021   Lactic acidosis 12/12/2021   Observation after surgery 12/11/2021   Educated about COVID-19 virus infection 09/21/2019   Cervical myelopathy ([place]) 05/14/2019   Left buttock abscess 08/27/2017   OSA on CPAP    Essential hypertension, benign    Polycythemia vera ([place])    Bipolar I disorder ([facility])    Depression    Anxiety state    Chronic combined  systolic and diastolic CHF (congestive heart failure) ([place])    Pulmonary hypertension ([facility])    Type 2 diabetes mellitus with other specified complication ([place]) 19/41/7408   Bipolar disorder ([place]) 11/14/2012   Hyperlipidemia    Anxiety    Asthma 09/05/2011   Allergic rhinitis 05/16/2011   Cardiomyopathy ([place]) 07/06/2010   Morbid obesity ([place]) 07/06/2010   OSA (obstructive sleep apnea) 06/05/2010   Obesity hypoventilation syndrome ([place]) 06/05/2010    Likely needs operative ostomy creation for diversion.  12 Days Post-Op    LOS: 12 days   Matt B. Hassell Done, MD, [facility] [phone] to reach the surgeon on call.    12/23/2021 9:35 AM

## 2021-12-23 NOTE — Progress Notes (Signed)
Physical Therapy Treatment Patient Details Name: [name] MRN: [id number] DOB: 09/27/1970 Today's Date: 12/23/2021   History of Present Illness 51 y.o. female presents to [facility] hospital on 12/11/2021 with abdominal pain, found to have perforated sigmoid diverticulitis. Pt underwent laparoscopy with washout and drain placement on 11/13. Pt extubated 11/14. PMH includes OSA, systolic HF, anxiety, OA, asthma, bipolar disorder, depression.    PT Comments    Willing to get OOB to chair, required min assist for bed mobility and transfer, min guard to ambulate only short distance to recliner; declines ambulating but does not state why she is unwilling to increase OOB mobility. Slow processing, family member in room reports recently taking medication for anxiety. IV team to room at end of session requested pt back to bed for procedure; assisted pt back to bed. Encouraged more mobility this afternoon with mobility specialist. Patient will continue to benefit from skilled physical therapy services to further improve independence with functional mobility.   Recommendations for follow up therapy are one component of a multi-disciplinary discharge planning process, led by the attending physician.  Recommendations may be updated based on patient status, additional functional criteria and insurance authorization.  Follow Up Recommendations  Home health PT     Assistance Recommended at Discharge Intermittent Supervision/Assistance  Patient can return home with the following A little help with walking and/or transfers;A little help with bathing/dressing/bathroom;Assistance with cooking/housework;Assist for transportation;Help with stairs or ramp for entrance   Equipment Recommendations  Rolling walker (2 wheels)    Recommendations for Other Services       Precautions / Restrictions Precautions Precautions: Fall Precaution Comments: 7 drains - new PICC line being placed  11/25 Restrictions Weight Bearing Restrictions: No     Mobility  Bed Mobility Overal bed mobility: Needs Assistance Bed Mobility: Rolling, Sidelying to Sit, Sit to Sidelying Rolling: Min assist Sidelying to sit: Min assist     Sit to sidelying: Min assist General bed mobility comments: Assisted with rolling to reach for rail and use to push up from Rt side, assisting lightly with a lot of VC for sequencing and technique. Managed drains for patient.    Transfers Overall transfer level: Needs assistance Equipment used: Rolling walker (2 wheels) Transfers: Sit to/from Stand Sit to Stand: Min assist           General transfer comment: Min assist for boost to stand from bed and recliner. Cues for handplacement however not responsive to instructions.    Ambulation/Gait Ambulation/Gait assistance: Min guard Gait Distance (Feet): 4 Feet (x2) Assistive device: Rolling walker (2 wheels) Gait Pattern/deviations: Step-through pattern, Decreased stride length, Wide base of support Gait velocity: decreased Gait velocity interpretation: <1.31 ft/sec, indicative of household ambulator Pre-gait activities: weight shifting, stationary march General Gait Details: Encouraged to increase ambulatory distance in room however refuses. Taking small steps laterally and forward to approach recliner. Performed 4 feet to recliner, then 4 feet back to bed with min guard for safety. Cues for walker placement, not to lift from ground to navigate objects.   Stairs             Wheelchair Mobility    Modified Rankin (Stroke Patients Only)       Balance Overall balance assessment: Needs assistance Sitting-balance support: No upper extremity supported, Feet supported Sitting balance-Leahy Scale: Good     Standing balance support: No upper extremity supported, During functional activity Standing balance-Leahy Scale: Fair  Cognition  Arousal/Alertness: Awake/alert Behavior During Therapy: WFL for tasks assessed/performed Overall Cognitive Status: Impaired/Different from baseline                               Problem Solving: Slow processing, Requires verbal cues General Comments: pt is self limiting        Exercises      General Comments General comments (skin integrity, edema, etc.): SpO2 92-93 with 0.5L supplemental O2 and while on RA during session.      Pertinent Vitals/Pain Pain Assessment Pain Assessment: No/denies pain    Home Living                          Prior Function            PT Goals (current goals can now be found in the care plan section) Acute Rehab PT Goals Patient Stated Goal: be independent again PT Goal Formulation: With patient Time For Goal Achievement: 12/27/21 Potential to Achieve Goals: Good Progress towards PT goals: Progressing toward goals    Frequency    Min 3X/week      PT Plan Current plan remains appropriate    Co-evaluation              AM-PAC PT "6 Clicks" Mobility   Outcome Measure  Help needed turning from your back to your side while in a flat bed without using bedrails?: A Little Help needed moving from lying on your back to sitting on the side of a flat bed without using bedrails?: A Little Help needed moving to and from a bed to a chair (including a wheelchair)?: A Little Help needed standing up from a chair using your arms (e.g., wheelchair or bedside chair)?: A Little Help needed to walk in hospital room?: A Little Help needed climbing 3-5 steps with a railing? : A Lot 6 Click Score: 17    End of Session Equipment Utilized During Treatment: Oxygen Activity Tolerance: Other (comment) (Self limiting) Patient left: with call bell/phone within reach;in bed;with family/visitor present;with nursing/sitter in room;Other (comment) (IV team)   PT Visit Diagnosis: Other abnormalities of gait and mobility (R26.89);Muscle  weakness (generalized) (M62.81);Pain Pain - part of body:  (abdomen)     Time: 8811-0315 PT Time Calculation (min) (ACUTE ONLY): 28 min  Charges:  $Therapeutic Activity: 23-37 mins                     Candie Mile, PT, DPT Physical Therapist Acute Rehabilitation Services [place]  12/23/2021, 10:52 AM

## 2021-12-23 NOTE — Progress Notes (Signed)
Initial Nutrition Assessment  DOCUMENTATION CODES:   Morbid obesity  INTERVENTION:   - TPN management per Pharmacy  NUTRITION DIAGNOSIS:   Inadequate oral intake related to altered GI function as evidenced by other (clear liquid diet order).  GOAL:   Patient will meet greater than or equal to 90% of their needs  MONITOR:   PO intake, Diet advancement, Labs, Weight trends, I & O's, Other (TPN regimen)  REASON FOR ASSESSMENT:   Consult New TPN/TNA  ASSESSMENT:   51 year old female who presented to the ED on 11/13 with abdominal pain. PMH of CHF, agoraphobia, anxiety, bipolar disorder, depression, HLD, HTN, IBS, seizures, OSA, obesity hypoventilation. Pt admitted with abscess cavity in the lower mid abdomen near the sigmoid colon with purulent peritonitis and fibrinous exudate throughout the abdomen suspected to be secondary to perforated sigmoid diverticulitis.  11/13 - s/p laparoscopic peritoneal lavage and evacuation of abscess with drain placement x 3 11/17 - clear liquid diet 11/18 - GI soft diet 11/21 - NPO, s/p placement of 12 Fr drain into dominant abdominal fluid collection in R lower abdomen/pelvis, s/p placement of 12 Fr drain into dominant abdominal fluid collection in L lower abdomen/pelvis, s/p placement of 10 Fr drain into fluid collection in R mid abdomen, s/p placement of 10 Fr drain into fluid collection in L mid abdomen, later advanced to regular diet 11/22 - NPO, later advanced to regular diet 11/25 - NPO, later advanced to clear liquid diet, TPN start  RD working remotely. Unable to reach pt via phone call. Consult received for new TPN. Per Surgery note, pt will likely need diverting colostomy this week.  Discussed pt with TPN Pharmacist. Goal TPN rate is 100 ml/hr which will provide 2313 kcal and 130 grams of protein daily. TPN starting at 50 ml/hr today to provide 1157 kcal and 65 grams of protein. Pharmacy not adding lipids to TPN in setting of active  fungal infection. Pt currently on clear liquid diet.  Unable to obtain diet and weight history at this time. Noted pt consuming 25-100% of meals when previously on a GI soft or regular diet. Reviewed weight history in chart. Pt with a 7.7 kg weight loss since 03/30/21. This is a 5.1% weight loss which is not clinically significant for timeframe.  Noted pt on colace and miralax. Discussed with Dr. [place] who will d/c these meds. Dr. [place] would like for pt to remain on clear liquid diet at this time.  Pt with mild pitting generalized edema and mild pitting edema to BLE.  Admit weight: 138.6 kg Most recent weight: 143.3 kg on 12/18/21  Medications reviewed and include: adderall 20 mg TID, colace, SSI q 4 hours, protonix, miralax, IV diflucan, IV abx, TPN IVF: LR @ 50 ml/hr  Labs reviewed: WBC 12.1 CBG's: 110-151 x 24 hours  19 Fr LUQ JP drain: 5 ml x 24 hours 10 Fr LUQ drain: 5 ml x 24 hours 19 Fr midline abd drain: 3 ml x 24 hours 19 Fr R abd JP drain: 5 ml x 24 hours 12 Fr LLQ drain: 60 ml x 24 hours 10 Fr RUQ drain: 5 ml x 24 hours 12 Fr RLQ drain: 140 ml x 24 hours I/O's: +10.6 L since admit  NUTRITION - FOCUSED PHYSICAL EXAM:  Unable to complete at this time. RD working remotely.  Diet Order:   Diet Order             Diet clear liquid Room service appropriate? Yes; Fluid consistency: Thin  Diet effective now                   EDUCATION NEEDS:   No education needs have been identified at this time  Skin:  Skin Assessment: Reviewed RN Assessment (abd incision)  Last BM:  12/22/21 per pt  Height:   Ht Readings from Last 1 Encounters:  12/13/21 '5\' 5"'$  (1.651 m)    Weight:   Wt Readings from Last 1 Encounters:  12/18/21 (!) 143.3 kg    Ideal Body Weight:  56.8 kg Adjusted Body Weight: 92 kg  BMI:  Body mass index is 52.57 kg/m.  Estimated Nutritional Needs:   Kcal:  2200-2400  Protein:  115-135 grams  Fluid:  >2.0 L    Gustavus Bryant, MS, RD,  LDN Inpatient Clinical Dietitian Please see AMiON for contact information.

## 2021-12-23 NOTE — Progress Notes (Addendum)
PHARMACY - TOTAL PARENTERAL NUTRITION CONSULT NOTE   Indication:  abdominal abscess  Patient Measurements: Height: '5\' 5"'$  (165.1 cm) Weight: (!) 143.3 kg (315 lb 14.7 oz) IBW/kg (Calculated) : 57  Adjusted Body Weight: 91 kg - used for nutrition calculations Body mass index is 52.57 kg/m. Usual Weight: ~141 kg  Assessment:  51 yo F with PMH of HF, HTN, HLD, seizures, T2DM, anxiety, bipolar, depression, pulmonary hypertension, and obesity presented to the ED on 11/13 with abdominal pain and found to have pneumoperitoneum on CT. Pt went to OR on 11/13 and found to have perforated sigmoid diverticulitis w/ peritonitis and abscess and underwent laprascopic peritoneal lavage w/ drain placement and was initiated on Zosyn for E. coli and  Bacteroides thetaiotaomicron from the wound culture and Bacteroides thetaioatomicron bacteremia. On 11/17, pt was gradually restarted on CLD, advanced to soft diet on 11/18. Drain output became purulent on 11/20 and CT abdomen showed multiple large intraabdominal fluid collections and pt was made NPO on 11/21. Pt underwent CT-guided placement of 4 abdominal drains with feculent, foul smelling drainage output in IR on 11/21, concerning for abdominal abscess. Micafungin was added to Zosyn for coverage of yeast in addition to Bacteroides and Clostridium perfringens growing from abdominal drainage culture on 11/21. Pharmacy has been consulted to initiate and manage TPN to allow for perforated sigmoid to heal over.  Glucose / Insulin: cBG <180, A1c 5.9% (10/23), rSSI requiring 13 units/24h Electrolytes: WNL Renal: SCr <1, stable Hepatic: albumin 1.7, LFTs/AlkPhos/Tbili WNL Intake / Output; MIVF: abdominal drain output (total): 274m; LBM: 11/25; UOP not documented GI Imaging: 11/13 CT abd: sigmoid colitis and colonic perforation, likely ileus in mid jejunum 11/20 CT abd: multifocal intra-abdominal abscesses in the setting of perforated viscus, small volume perihepatic  ascites, cholelithiasis GI Surgeries / Procedures:  11/13: laparoscopic peritoneal lavage w/ drain placement 11/21: CT-guided placement of 4 abdominal drains by IR  Central access: PICC to be placed 11/25 (order entered) TPN start date: 12/23/21  Nutritional Goals: Goal TPN rate is 100 mL/hr (provides 130 g of protein and 2313 kcals per day)  RD Assessment: Estimated Needs Total Energy Estimated Needs: 2200-2400 Total Protein Estimated Needs: 115-135 grams Total Fluid Estimated Needs: >2.0 L  Current Nutrition:  Clear liquids  Plan:  Start TPN at 541mhr at 1800 to provide 65g protein and 1157 kcal/day, meeting ~50% of daily nutritional needs.  Note: Lipids will NOT be added to TPN in setting of active fungal infection.  Electrolytes in TPN: Na 5036mL, K 43m62m, Ca 5mEq37m Mg 8mEq/67mand Phos 20mmol82mCl:Ac 1:1 Add standard MVI and trace elements to TPN Continue Resistant q4h SSI and adjust as needed  Initiate MIVF with LR at 50 mL/hr Monitor TPN labs on Mon/Thurs, and daily until stable on TPN.  Bazil Dhanani JLuisa HartD, BCPS Clinical Pharmacist 12/23/2021 11:18 AM   Please refer to AMION for pharmacy phone number

## 2021-12-23 NOTE — Progress Notes (Signed)
PROGRESS NOTE    RAKESHA Velasquez  KZL:[id number] DOB: [birth year] DOA: 12/11/2021 PCP: [place], Betty G, MD    Brief Narrative:   [name] is a 51 y.o. female with past medical history significant for OSA/OHS on CPAP, bipolar 1 disorder, Hx chronic systolic congestive heart failure/cardiomyopathy who presented to [facility] ED on 11/13 with abdominal pain.  In the ED, CT abdomen/pelvis notable for free air and fluid.  General surgery was consulted and patient was taken emergently to the operating room with finding of a well-formed abscess cavity in the lower mid abdomen near the sigmoid colon with purulent peritonitis and fibrinous exudate suspected secondary to perforated sigmoid diverticulitis.  Patient had laparoscopic peritoneal lavage with drains placed and patient was transferred to the intensive care unit postoperatively as she remained intubated but was successfully extubated the following day.  Given continued purulence from drain output, repeat CT abdomen/pelvis was performed on 11/20 with findings of multiple fluid and gas collections; IR was consulted on 11/21 for repeat drain placement.  Assessment & Plan:   Abdominal abscess/peritonitis 2/2 presumed perforated sigmoid diverticulitis Patient presenting to ED with abdominal pain, CT abdomen/pelvis notable for free air and fluid.  General surgery was consulted and patient was taken emergently to the OR with findings of a well-formed abscess cavity lower mid abdomen near the sigmoid colon with purulent peritonitis and fibrinous exudate suspected secondary to a perforated sigmoid diverticulitis; and underwent laparoscopic peritoneal lavage with drain placement.  Repeat CT abdomen/pelvis 7/20 with multiple air-fluid levels and general surgery consulted IR for further drain placement under IR guidance. -- General surgery following, appreciate assistance -- WBC 5.6>>18.8>>12.5>13.3>12.9>12.3>11.6>11.8 -- Blood cultures 11/13: +  Bacteroides thetaiotaomicron -- Operative culture 11/13: E. coli, abundant Bacteroides thetaiotaomicron -- Culture from IR drain placement 11/21: Abundant Bacteroides, Clostridium perfringens, Candida albicans -- Zosyn 3.375 g IV every 8 hours -- Fluconazole -- Continue to monitor drain output closely (currently 7 drains; 3 placed by CCS and 4 placed by IR); of which lower quadrant drains continue with significant feculent output --Gust with general surgery today, Dr. Hassell Done and anticipate likely need of diverting colostomy this coming week --Clear liquid diet, start TPN, PICC line ordered -- CBC daily  OSA/OHS Follows with pulmonology outpatient, Dr. Elsworth Soho. -- Continue nocturnal CPAP  Hx chronic systolic/diastolic congestive heart failure/cardiomyopathy Follows with cardiology outpatient, Dr. Percival Spanish.  Most recent echocardiogram February 2022 with now improved LVEF of 60 to 39%, grade 1 diastolic dysfunction --Continue to hold home Lasix  Hx seizures, bipolar disorder, depression --Continue alprazolam '1mg'$  PO TID, Lyrica '300mg'$  PO BID, Vraylar '6mg'$  PO qHS  Acute renal failure Etiology likely secondary to infection versus dehydration from abdominal abscess/peritonitis as above.  Home Lasix on hold, supported with IV fluids with now normalization of creatinine, 0.79. --Continue intermittent monitoring of renal function  GERD: --Protonix 40 mg p.o. twice daily  Morbid obesity Body mass index is 52.57 kg/m.  Discussed with patient needs for aggressive lifestyle changes/weight loss as this complicates all facets of care.  Outpatient follow-up with PCP.  May benefit from bariatric evaluation outpatient.   DVT prophylaxis: heparin injection 5,000 Units Start: 12/12/21 0600    Code Status: Full Code Family Communication: Updated sister-in-law [name] this morning Disposition Plan:  Level of care: Med-Surg Status is: Inpatient Remains inpatient appropriate because: IV antibiotics,  anticipate need of diverting colostomy upcoming this week    Consultants:  General surgery Interventional radiology  Procedures:  Laparoscopic peritoneal lavage with drain placement x 3,  Dr. Thermon Leyland; 11/13 CT guided drain placement x 4; IR, 11/21, Dr. Pascal Lux  Antimicrobials:  Zosyn 11/13>> Micafungin 11/22 - 11/24 Fluconazole 11/25>> Metronidazole 11/13 - 11/13   Subjective: Patient seen and examined bedside, resting comfortably.  Lying in bed.  Continues with feculent drainage from lower quadrant drains.  Remains on IV antibiotics.  Discussed with general surgery, Dr. Hassell Done this morning; anticipate need of diverting colostomy this coming week.  Updated patient's sister-in-law, [name] this morning. Patient denies headache, no fever/chills/night sweats, no nausea/vomiting, no diarrhea, no chest pain, no palpitations, no shortness of breath, no abdominal pain, no cough/congestion.  No acute events overnight per nursing staff.  Objective: Vitals:   12/22/21 0936 12/22/21 1652 12/22/21 2240 12/23/21 0515  BP: (!) 153/88 139/79 137/73 (!) 152/76  Pulse: 90 84 84   Resp: '18 18 20 18  '$ Temp: 97.8 F (36.6 C) 98.3 F (36.8 C) 98.5 F (36.9 C) 98.1 F (36.7 C)  TempSrc: Oral  Oral Oral  SpO2: 91% 95% 97% 90%  Weight:      Height:        Intake/Output Summary (Last 24 hours) at 12/23/2021 1254 Last data filed at 12/23/2021 6237 Gross per 24 hour  Intake 240 ml  Output 103 ml  Net 137 ml   Filed Weights   12/11/21 2114 12/18/21 0432  Weight: (!) 138.6 kg (!) 143.3 kg    Examination:  Physical Exam: GEN: NAD, alert and oriented x 3, obese; appears older than stated age, chronically ill in appearance HEENT: NCAT, PERRL, EOMI, sclera clear, MMM PULM: CTAB w/o wheezes/crackles, normal respiratory effort, on room air CV: RRR w/o M/G/R GI: abd soft, mild lower abdominal pain on palpation, NABS, no R/G/M; abdominal drains x 7 noted with continued feculent output MSK: no  peripheral edema, moves all extremities independently NEURO: CN II-XII intact, no focal deficits, sensation to light touch intact PSYCH: normal mood/affect Integumentary: Abdominal drains/surgical incision sites noted, otherwise no other concerning rashes/lesions/wounds noted on exposed skin surfaces.      Data Reviewed: I have personally reviewed following labs and imaging studies  CBC: Recent Labs  Lab 12/20/21 0416 12/20/21 2333 12/21/21 0752 12/22/21 0320 12/23/21 0139  WBC 12.3* 12.1* 11.6* 11.8* 12.1*  HGB 11.6* 11.8* 12.1 11.0* 11.0*  HCT 35.8* 37.3 36.6 34.6* 33.9*  MCV 93.7 94.0 92.7 93.8 91.6  PLT 556* 596* 550* 560* 628*   Basic Metabolic Panel: Recent Labs  Lab 12/18/21 0325 12/19/21 0511 12/20/21 0416 12/20/21 2333 12/22/21 0320 12/23/21 0139  NA 141 142 141 139 141 142  K 4.2 4.4 3.7 4.2 4.0 4.7  CL 107 103 101 101 100 101  CO2 19* '26 29 27 26 28  '$ GLUCOSE 134* 153* 129* 137* 151* 123*  BUN '9 7 8 8 8 8  '$ CREATININE 1.01* 0.77 0.79 0.77 0.72 0.60  CALCIUM 8.2* 8.7* 8.7* 8.6* 8.9 8.9  MG 2.1  --  2.2 2.0 1.8  --    GFR: Estimated Creatinine Clearance: 118.8 mL/min (by C-G formula based on SCr of 0.6 mg/dL). Liver Function Tests: Recent Labs  Lab 12/23/21 0923  AST 25  ALT 14  ALKPHOS 89  BILITOT 0.3  PROT 5.7*  ALBUMIN 1.7*   No results for input(s): "LIPASE", "AMYLASE" in the last 168 hours. No results for input(s): "AMMONIA" in the last 168 hours. Coagulation Profile: No results for input(s): "INR", "PROTIME" in the last 168 hours. Cardiac Enzymes: No results for input(s): "CKTOTAL", "CKMB", "CKMBINDEX", "TROPONINI" in the  last 168 hours. BNP (last 3 results) No results for input(s): "PROBNP" in the last 8760 hours. HbA1C: No results for input(s): "HGBA1C" in the last 72 hours. CBG: Recent Labs  Lab 12/22/21 2154 12/23/21 0011 12/23/21 0503 12/23/21 0721 12/23/21 1156  GLUCAP 151* 117* 126* 132* 110*   Lipid Profile: No results  for input(s): "CHOL", "HDL", "LDLCALC", "TRIG", "CHOLHDL", "LDLDIRECT" in the last 72 hours. Thyroid Function Tests: No results for input(s): "TSH", "T4TOTAL", "FREET4", "T3FREE", "THYROIDAB" in the last 72 hours. Anemia Panel: No results for input(s): "VITAMINB12", "FOLATE", "FERRITIN", "TIBC", "IRON", "RETICCTPCT" in the last 72 hours. Sepsis Labs: No results for input(s): "PROCALCITON", "LATICACIDVEN" in the last 168 hours.  Recent Results (from the past 240 hour(s))  Aerobic/Anaerobic Culture w Gram Stain (surgical/deep wound)     Status: None   Collection Time: 12/19/21  4:58 PM   Specimen: Wound; Abscess  Result Value Ref Range Status   Specimen Description WOUND  Final   Special Requests POST CT GUIDED DRAIN PLACEMENT  Final   Gram Stain   Final    FEW WBC PRESENT, PREDOMINANTLY PMN MODERATE GRAM POSITIVE RODS FEW GRAM NEGATIVE RODS RARE BUDDING YEAST SEEN    Culture   Final    ABUNDANT CANDIDA ALBICANS ABUNDANT BACTEROIDES SPECIES NOT FRAGILIS BETA LACTAMASE POSITIVE FEW CLOSTRIDIUM PERFRINGENS Standardized susceptibility testing for this organism is not available. Performed at [facility], [place] [address] [place], [place] [address]    Report Status 12/22/2021 FINAL  Final         Radiology Studies: [place] EKG SITE RITE  Result Date: 12/23/2021 If Site Rite image not attached, placement could not be confirmed due to current cardiac rhythm.       Scheduled Meds:  acetaminophen  1,000 mg Oral Q6H   ALPRAZolam  1 mg Oral TID   amphetamine-dextroamphetamine  20 mg Oral TID   cariprazine  6 mg Oral QPM   Chlorhexidine Gluconate Cloth  6 each Topical Daily   Dextromethorphan-quiNIDine  1 capsule Oral Q12H   heparin injection (subcutaneous)  5,000 Units Subcutaneous Q8H   insulin aspart  0-20 Units Subcutaneous Q4H   lidocaine  20 mL Intradermal Once   methocarbamol  1,000 mg Oral Q8H   mouth rinse  15 mL Mouth Rinse 4 times per day   pantoprazole  40 mg  Oral BID   pregabalin  300 mg Oral BID   sodium chloride flush  5 mL Intracatheter Q8H   Continuous Infusions:  [START ON 12/24/2021] fluconazole (DIFLUCAN) IV     Followed by   Derrill Memo ON 12/25/2021] fluconazole (DIFLUCAN) IV     lactated ringers     piperacillin-tazobactam (ZOSYN)  IV 3.375 g (12/23/21 0612)   TPN ADULT (ION)       LOS: 12 days    Time spent: 51 minutes spent on chart review, discussion with nursing staff, consultants, updating family and interview/physical exam; more than 50% of that time was spent in counseling and/or coordination of care.    Olive Motyka J [place] DO [facility] Available via Epic secure chat 7am-7pm After these hours, please refer to coverage provider listed on [email or web address] 12/23/2021, 12:54 PM

## 2021-12-23 NOTE — Progress Notes (Signed)
Mobility Specialist Progress Note:   12/23/21 1332  Mobility  Activity Refused mobility   Pt politely refused mobility d/t feeling frustrated at hospitalization and recent PICC. Will f/u as able.    Andrey Campanile Mobility Specialist Please contact via SecureChat or  Rehab office at [phone]

## 2021-12-24 ENCOUNTER — Inpatient Hospital Stay (HOSPITAL_COMMUNITY): Payer: Medicare PPO

## 2021-12-24 DIAGNOSIS — K668 Other specified disorders of peritoneum: Secondary | ICD-10-CM | POA: Diagnosis not present

## 2021-12-24 LAB — CBC
HCT: 33.1 % — ABNORMAL LOW (ref 36.0–46.0)
Hemoglobin: 10.8 g/dL — ABNORMAL LOW (ref 12.0–15.0)
MCH: 30.3 pg (ref 26.0–34.0)
MCHC: 32.6 g/dL (ref 30.0–36.0)
MCV: 93 fL (ref 80.0–100.0)
Platelets: 657 10*3/uL — ABNORMAL HIGH (ref 150–400)
RBC: 3.56 MIL/uL — ABNORMAL LOW (ref 3.87–5.11)
RDW: 15.3 % (ref 11.5–15.5)
WBC: 11.6 10*3/uL — ABNORMAL HIGH (ref 4.0–10.5)
nRBC: 0 % (ref 0.0–0.2)

## 2021-12-24 LAB — GLUCOSE, CAPILLARY
Glucose-Capillary: 171 mg/dL — ABNORMAL HIGH (ref 70–99)
Glucose-Capillary: 174 mg/dL — ABNORMAL HIGH (ref 70–99)
Glucose-Capillary: 176 mg/dL — ABNORMAL HIGH (ref 70–99)
Glucose-Capillary: 191 mg/dL — ABNORMAL HIGH (ref 70–99)
Glucose-Capillary: 249 mg/dL — ABNORMAL HIGH (ref 70–99)

## 2021-12-24 LAB — TRIGLYCERIDES: Triglycerides: 146 mg/dL (ref <150)

## 2021-12-24 LAB — COMPREHENSIVE METABOLIC PANEL
ALT: 11 U/L (ref 0–44)
AST: 20 U/L (ref 15–41)
Albumin: 1.5 g/dL — ABNORMAL LOW (ref 3.5–5.0)
Alkaline Phosphatase: 87 U/L (ref 38–126)
Anion gap: 11 (ref 5–15)
BUN: 6 mg/dL (ref 6–20)
CO2: 30 mmol/L (ref 22–32)
Calcium: 8.7 mg/dL — ABNORMAL LOW (ref 8.9–10.3)
Chloride: 100 mmol/L (ref 98–111)
Creatinine, Ser: 0.65 mg/dL (ref 0.44–1.00)
GFR, Estimated: >60 mL/min (ref >60)
Glucose, Bld: 194 mg/dL — ABNORMAL HIGH (ref 70–99)
Potassium: 3.6 mmol/L (ref 3.5–5.1)
Sodium: 141 mmol/L (ref 135–145)
Total Bilirubin: <0.1 mg/dL — ABNORMAL LOW (ref 0.3–1.2)
Total Protein: 5.4 g/dL — ABNORMAL LOW (ref 6.5–8.1)

## 2021-12-24 LAB — MAGNESIUM: Magnesium: 1.9 mg/dL (ref 1.7–2.4)

## 2021-12-24 LAB — PHOSPHORUS: Phosphorus: 3.5 mg/dL (ref 2.5–4.6)

## 2021-12-24 MED ORDER — IOHEXOL 350 MG/ML SOLN
100.0000 mL | Freq: Once | INTRAVENOUS | Status: AC | PRN
  Administered 2021-12-24: 100 mL via INTRAVENOUS

## 2021-12-24 MED ORDER — POTASSIUM CHLORIDE 10 MEQ/50ML IV SOLN
10.0000 meq | INTRAVENOUS | Status: AC
  Administered 2021-12-24 (×4): 10 meq via INTRAVENOUS
  Filled 2021-12-24 (×4): qty 50

## 2021-12-24 MED ORDER — INSULIN ASPART 100 UNIT/ML IJ SOLN
0.0000 [IU] | Freq: Four times a day (QID) | INTRAMUSCULAR | Status: DC
  Administered 2021-12-24 – 2021-12-25 (×2): 4 [IU] via SUBCUTANEOUS
  Administered 2021-12-25: 3 [IU] via SUBCUTANEOUS
  Administered 2021-12-25 – 2021-12-26 (×3): 4 [IU] via SUBCUTANEOUS
  Administered 2021-12-26: 7 [IU] via SUBCUTANEOUS
  Administered 2021-12-26: 4 [IU] via SUBCUTANEOUS
  Administered 2021-12-27 (×4): 7 [IU] via SUBCUTANEOUS
  Administered 2021-12-28: 3 [IU] via SUBCUTANEOUS
  Administered 2021-12-28: 7 [IU] via SUBCUTANEOUS
  Administered 2021-12-28 – 2021-12-29 (×4): 4 [IU] via SUBCUTANEOUS
  Administered 2021-12-29: 7 [IU] via SUBCUTANEOUS
  Administered 2021-12-29 – 2021-12-30 (×2): 4 [IU] via SUBCUTANEOUS
  Administered 2021-12-30: 7 [IU] via SUBCUTANEOUS
  Administered 2021-12-30 – 2021-12-31 (×4): 4 [IU] via SUBCUTANEOUS

## 2021-12-24 MED ORDER — TRAVASOL 10 % IV SOLN
INTRAVENOUS | Status: AC
  Filled 2021-12-24: qty 1296

## 2021-12-24 MED ORDER — IOHEXOL 12 MG/ML PO SOLN: 500.0000 mL | ORAL | Status: AC

## 2021-12-24 MED ORDER — POTASSIUM CHLORIDE 10 MEQ/50ML IV SOLN
10.0000 meq | INTRAVENOUS | Status: DC
  Filled 2021-12-24 (×4): qty 50

## 2021-12-24 MED ORDER — IOHEXOL 9 MG/ML PO SOLN
500.0000 mL | ORAL | Status: AC
  Administered 2021-12-24 (×2): 500 mL via ORAL

## 2021-12-24 NOTE — Progress Notes (Signed)
PROGRESS NOTE    [name]  ZJI:[id number] DOB: 10-15-1970 DOA: 12/11/2021 PCP: [place], Betty G, MD    Brief Narrative:   [name] is a 51 y.o. female with past medical history significant for OSA/OHS on CPAP, bipolar 1 disorder, Hx chronic systolic congestive heart failure/cardiomyopathy who presented to [facility] ED on 11/13 with abdominal pain.  In the ED, CT abdomen/pelvis notable for free air and fluid.  General surgery was consulted and patient was taken emergently to the operating room with finding of a well-formed abscess cavity in the lower mid abdomen near the sigmoid colon with purulent peritonitis and fibrinous exudate suspected secondary to perforated sigmoid diverticulitis.  Patient had laparoscopic peritoneal lavage with drains placed and patient was transferred to the intensive care unit postoperatively as she remained intubated but was successfully extubated the following day.  Given continued purulence from drain output, repeat CT abdomen/pelvis was performed on 11/20 with findings of multiple fluid and gas collections; IR was consulted on 11/21 for repeat drain placement.  Assessment & Plan:   Abdominal abscess/peritonitis 2/2 presumed perforated sigmoid diverticulitis Patient presenting to ED with abdominal pain, CT abdomen/pelvis notable for free air and fluid.  General surgery was consulted and patient was taken emergently to the OR with findings of a well-formed abscess cavity lower mid abdomen near the sigmoid colon with purulent peritonitis and fibrinous exudate suspected secondary to a perforated sigmoid diverticulitis; and underwent laparoscopic peritoneal lavage with drain placement.  Repeat CT abdomen/pelvis 7/20 with multiple air-fluid levels and general surgery consulted IR for further drain placement under IR guidance. -- General surgery following, appreciate assistance -- WBC 5.6>>18.8>>12.5>13.3>12.9>12.3>11.6>11.8>11.6 -- Blood cultures 11/13: +  Bacteroides thetaiotaomicron -- Operative culture 11/13: E. coli, abundant Bacteroides thetaiotaomicron -- Culture from IR drain placement 11/21: Abundant Bacteroides, Clostridium perfringens, Candida albicans -- Zosyn 3.375 g IV every 8 hours -- Fluconazole '400mg'$  IV q24h -- Continue to monitor drain output closely (currently 7 drains; 3 placed by CCS and 4 placed by IR); of which lower quadrant drains continue with significant feculent output -- Discussed with general surgery today, Dr. Garnette Czech and anticipate likely need of diverting colostomy this coming week; repeating CT abdomen/pelvis today -- Clear liquid diet, start TPN -- CBC daily  OSA/OHS Follows with pulmonology outpatient, Dr. Elsworth Soho. -- Continue nocturnal CPAP  Hx chronic systolic/diastolic congestive heart failure/cardiomyopathy Follows with cardiology outpatient, Dr. Percival Spanish.  Most recent echocardiogram February 2022 with now improved LVEF of 60 to 17%, grade 1 diastolic dysfunction --Continue to hold home Lasix  Hx seizures, bipolar disorder, depression --Continue alprazolam '1mg'$  PO TID, Lyrica '300mg'$  PO BID, Vraylar '6mg'$  PO qHS  Acute renal failure Etiology likely secondary to infection versus dehydration from abdominal abscess/peritonitis as above.  Home Lasix on hold, supported with IV fluids with now normalization of creatinine, 0.79. --Continue intermittent monitoring of renal function  GERD: --Protonix 40 mg p.o. twice daily  Morbid obesity Body mass index is 52.57 kg/m.  Discussed with patient needs for aggressive lifestyle changes/weight loss as this complicates all facets of care.  Outpatient follow-up with PCP.  May benefit from bariatric evaluation outpatient.   DVT prophylaxis: heparin injection 5,000 Units Start: 12/12/21 0600    Code Status: Full Code Family Communication: Updated sister-in-law Guerry Minors this morning Disposition Plan:  Level of care: Med-Surg Status is: Inpatient Remains inpatient  appropriate because: IV antibiotics, anticipate need of diverting colostomy upcoming this week    Consultants:  General surgery Interventional radiology  Procedures:  Laparoscopic peritoneal  lavage with drain placement x 3, Dr. Thermon Leyland; 11/13 CT guided drain placement x 4; IR, 11/21, Dr. Pascal Lux PICC line 11/25  Antimicrobials:  Zosyn 11/13>> Micafungin 11/22 - 11/24 Fluconazole 11/25>> Metronidazole 11/13 - 11/13   Subjective: Patient seen and examined bedside, resting comfortably.  Lying in bed.  Continues with feculent drainage from lower quadrant drains.  Remains on IV antibiotics.  PICC line placed yesterday and started on TPN.  Discussed with general surgery, Dr. Donne Hazel this morning; anticipate need of diverting colostomy this coming week; but will repeat CT abdomen/pelvis today.  Updated patient's sister-in-law, Guerry Minors this morning at bedside. Patient denies headache, no fever/chills/night sweats, no nausea/vomiting, no diarrhea, no chest pain, no palpitations, no shortness of breath, no abdominal pain, no cough/congestion.  No acute events overnight per nursing staff.  Objective: Vitals:   12/23/21 0515 12/23/21 1625 12/23/21 2011 12/24/21 0430  BP: (!) 152/76 (!) 135/94 (!) 143/76 (!) 140/41  Pulse:  76 79 76  Resp: '18 18 18 18  '$ Temp: 98.1 F (36.7 C) 98 F (36.7 C) 97.8 F (36.6 C) 97.8 F (36.6 C)  TempSrc: Oral  Oral Oral  SpO2: 90% 95% 99% 93%  Weight:      Height:        Intake/Output Summary (Last 24 hours) at 12/24/2021 1055 Last data filed at 12/24/2021 0900 Gross per 24 hour  Intake 2396.17 ml  Output 198.5 ml  Net 2197.67 ml   Filed Weights   12/11/21 2114 12/18/21 0432  Weight: (!) 138.6 kg (!) 143.3 kg    Examination:  Physical Exam: GEN: NAD, alert and oriented x 3, obese; appears older than stated age, chronically ill in appearance HEENT: NCAT, PERRL, EOMI, sclera clear, MMM PULM: CTAB w/o wheezes/crackles, normal respiratory  effort, on room air CV: RRR w/o M/G/R GI: abd soft, mild lower abdominal pain on palpation, NABS, no R/G/M; abdominal drains x 7 noted with continued feculent output MSK: no peripheral edema, moves all extremities independently NEURO: CN II-XII intact, no focal deficits, sensation to light touch intact PSYCH: normal mood/affect Integumentary: Abdominal drains/surgical incision sites noted, otherwise no other concerning rashes/lesions/wounds noted on exposed skin surfaces.      Data Reviewed: I have personally reviewed following labs and imaging studies  CBC: Recent Labs  Lab 12/20/21 2333 12/21/21 0752 12/22/21 0320 12/23/21 0139 12/24/21 0412  WBC 12.1* 11.6* 11.8* 12.1* 11.6*  HGB 11.8* 12.1 11.0* 11.0* 10.8*  HCT 37.3 36.6 34.6* 33.9* 33.1*  MCV 94.0 92.7 93.8 91.6 93.0  PLT 596* 550* 560* 705* 932*   Basic Metabolic Panel: Recent Labs  Lab 12/18/21 0325 12/19/21 0511 12/20/21 0416 12/20/21 2333 12/22/21 0320 12/23/21 0139 12/24/21 0412  NA 141   < > 141 139 141 142 141  K 4.2   < > 3.7 4.2 4.0 4.7 3.6  CL 107   < > 101 101 100 101 100  CO2 19*   < > '29 27 26 28 30  '$ GLUCOSE 134*   < > 129* 137* 151* 123* 194*  BUN 9   < > '8 8 8 8 6  '$ CREATININE 1.01*   < > 0.79 0.77 0.72 0.60 0.65  CALCIUM 8.2*   < > 8.7* 8.6* 8.9 8.9 8.7*  MG 2.1  --  2.2 2.0 1.8  --  1.9  PHOS  --   --   --   --   --   --  3.5   < > = values in this  interval not displayed.   GFR: Estimated Creatinine Clearance: 118.8 mL/min (by C-G formula based on SCr of 0.65 mg/dL). Liver Function Tests: Recent Labs  Lab 12/23/21 0923 12/24/21 0412  AST 25 20  ALT 14 11  ALKPHOS 89 87  BILITOT 0.3 <0.1*  PROT 5.7* 5.4*  ALBUMIN 1.7* 1.5*   No results for input(s): "LIPASE", "AMYLASE" in the last 168 hours. No results for input(s): "AMMONIA" in the last 168 hours. Coagulation Profile: No results for input(s): "INR", "PROTIME" in the last 168 hours. Cardiac Enzymes: No results for input(s):  "CKTOTAL", "CKMB", "CKMBINDEX", "TROPONINI" in the last 168 hours. BNP (last 3 results) No results for input(s): "PROBNP" in the last 8760 hours. HbA1C: No results for input(s): "HGBA1C" in the last 72 hours. CBG: Recent Labs  Lab 12/23/21 1626 12/23/21 2007 12/23/21 2330 12/24/21 0426 12/24/21 0730  GLUCAP 105* 176* 133* 176* 174*   Lipid Profile: Recent Labs    12/24/21 0412  TRIG 146   Thyroid Function Tests: No results for input(s): "TSH", "T4TOTAL", "FREET4", "T3FREE", "THYROIDAB" in the last 72 hours. Anemia Panel: No results for input(s): "VITAMINB12", "FOLATE", "FERRITIN", "TIBC", "IRON", "RETICCTPCT" in the last 72 hours. Sepsis Labs: No results for input(s): "PROCALCITON", "LATICACIDVEN" in the last 168 hours.  Recent Results (from the past 240 hour(s))  Aerobic/Anaerobic Culture w Gram Stain (surgical/deep wound)     Status: None   Collection Time: 12/19/21  4:58 PM   Specimen: Wound; Abscess  Result Value Ref Range Status   Specimen Description WOUND  Final   Special Requests POST CT GUIDED DRAIN PLACEMENT  Final   Gram Stain   Final    FEW WBC PRESENT, PREDOMINANTLY PMN MODERATE GRAM POSITIVE RODS FEW GRAM NEGATIVE RODS RARE BUDDING YEAST SEEN    Culture   Final    ABUNDANT CANDIDA ALBICANS ABUNDANT BACTEROIDES SPECIES NOT FRAGILIS BETA LACTAMASE POSITIVE FEW CLOSTRIDIUM PERFRINGENS Standardized susceptibility testing for this organism is not available. Performed at [facility], [place] [address], [place], [place] [address]    Report Status 12/22/2021 FINAL  Final         Radiology Studies: [place] EKG SITE RITE  Result Date: 12/23/2021 If Site Rite image not attached, placement could not be confirmed due to current cardiac rhythm.       Scheduled Meds:  acetaminophen  1,000 mg Oral Q6H   ALPRAZolam  1 mg Oral TID   amphetamine-dextroamphetamine  20 mg Oral TID   cariprazine  6 mg Oral QPM   Chlorhexidine Gluconate Cloth  6 each  Topical Daily   Dextromethorphan-quiNIDine  1 capsule Oral Q12H   heparin injection (subcutaneous)  5,000 Units Subcutaneous Q8H   insulin aspart  0-20 Units Subcutaneous Q4H   lidocaine  20 mL Intradermal Once   methocarbamol  1,000 mg Oral Q8H   mouth rinse  15 mL Mouth Rinse 4 times per day   pantoprazole  40 mg Oral BID   pregabalin  300 mg Oral BID   sodium chloride flush  5 mL Intracatheter Q8H   Continuous Infusions:  fluconazole (DIFLUCAN) IV     Followed by   Derrill Memo ON 12/25/2021] fluconazole (DIFLUCAN) IV     lactated ringers Stopped (12/24/21 0743)   piperacillin-tazobactam (ZOSYN)  IV 3.375 g (12/24/21 0518)   potassium chloride 10 mEq (12/24/21 0945)   TPN ADULT (ION) 50 mL/hr at 12/24/21 0603   TPN ADULT (ION)       LOS: 13 days  Time spent: 50 minutes spent on chart review, discussion with nursing staff, consultants, updating family and interview/physical exam; more than 50% of that time was spent in counseling and/or coordination of care.    Morry Veiga J [place] DO [facility] Available via Epic secure chat 7am-7pm After these hours, please refer to coverage provider listed on [email or web address] 12/24/2021, 10:55 AM

## 2021-12-24 NOTE — Progress Notes (Signed)
13 Days Post-Op   Subjective/Chief Complaint: No complaints this am, having some bowel function   Objective: Vital signs in last 24 hours: Temp:  [97.8 F (36.6 C)-98 F (36.7 C)] 97.8 F (36.6 C) (11/26 0430) Pulse Rate:  [76-79] 76 (11/26 0430) Resp:  [18] 18 (11/26 0430) BP: (135-143)/(41-94) 140/41 (11/26 0430) SpO2:  [93 %-99 %] 93 % (11/26 0430) Last BM Date : 12/23/21  Intake/Output from previous day: 11/25 0701 - 11/26 0700 In: 2286.3 [P.O.:1068; I.V.:1088.9; IV Piggyback:89.5] Out: 198.5 [Urine:1; Drains:197.5] Intake/Output this shift: No intake/output data recorded.  Abdomen  soft approp tender drains with purulence. Some serous  Lab Results:  Recent Labs    12/23/21 0139 12/24/21 0412  WBC 12.1* 11.6*  HGB 11.0* 10.8*  HCT 33.9* 33.1*  PLT 705* 657*   BMET Recent Labs    12/23/21 0139 12/24/21 0412  NA 142 141  K 4.7 3.6  CL 101 100  CO2 28 30  GLUCOSE 123* 194*  BUN 8 6  CREATININE 0.60 0.65  CALCIUM 8.9 8.7*   PT/INR No results for input(s): "LABPROT", "INR" in the last 72 hours. ABG No results for input(s): "PHART", "HCO3" in the last 72 hours.  Invalid input(s): "PCO2", "PO2"  Studies/Results: [place] EKG SITE RITE  Result Date: 12/23/2021 If Site Rite image not attached, placement could not be confirmed due to current cardiac rhythm.   Anti-infectives: Anti-infectives (From admission, onward)    Start     Dose/Rate Route Frequency Ordered Stop   12/25/21 0900  fluconazole (DIFLUCAN) IVPB 400 mg       See Hyperspace for full Linked Orders Report.   400 mg 100 mL/hr over 120 Minutes Intravenous Every 24 hours 12/23/21 1120     12/24/21 0900  fluconazole (DIFLUCAN) IVPB 800 mg       See Hyperspace for full Linked Orders Report.   800 mg 200 mL/hr over 120 Minutes Intravenous  Once 12/23/21 1120     12/20/21 1000  micafungin (MYCAMINE) 150 mg in sodium chloride 0.9 % 100 mL IVPB  Status:  Discontinued        150 mg 107.5 mL/hr over  1 Hours Intravenous Every 24 hours 12/20/21 0904 12/23/21 1120   12/12/21 0600  ceFEPIme (MAXIPIME) 2 g in sodium chloride 0.9 % 100 mL IVPB  Status:  Discontinued        2 g 200 mL/hr over 30 Minutes Intravenous Every 12 hours 12/11/21 2129 12/11/21 2323   12/12/21 0600  piperacillin-tazobactam (ZOSYN) IVPB 3.375 g        3.375 g 12.5 mL/hr over 240 Minutes Intravenous Every 8 hours 12/11/21 2326     12/11/21 2330  piperacillin-tazobactam (ZOSYN) IVPB 3.375 g        3.375 g 100 mL/hr over 30 Minutes Intravenous To Post Anesthesia Care Unit 12/11/21 2326 12/12/21 0041   12/11/21 1630  metroNIDAZOLE (FLAGYL) IVPB 500 mg        500 mg 100 mL/hr over 60 Minutes Intravenous  Once 12/11/21 1619 12/11/21 2049   12/11/21 1630  ceFEPIme (MAXIPIME) 2 g in sodium chloride 0.9 % 100 mL IVPB        2 g 200 mL/hr over 30 Minutes Intravenous  Once 12/11/21 1623 12/11/21 1817       Assessment/Plan: POD 13 s/p diagnostic lap washout, evacuation of abscess, and JP drain placement x2, Dr. Thermon Leyland 11/13 -CT 11/20 showed multiple loculated air and fluid collections throughout the abdomen and pelvis. JP drain #  1 still purulent today, #2 and #3 serous. Will ask IR to evaluate for possible drain placement. Continue antibiotics for now. This was likely perforated diverticultitis from beginning and may just actually need colectomy and/or colostomy.  She has reached plateau it looks like. For decision making will repeat a ct today after drains -mobilize, PT   FEN - clears VTE - sq heparin ID - currently zosyn   GNR bacteremia  HTN Morbid obesity OSA CHF Bipolar disorder HLD Seizures  Rolm Bookbinder 12/24/2021

## 2021-12-24 NOTE — Progress Notes (Signed)
Mobility Specialist Progress Note:   12/24/21 1140  Mobility  Activity Refused mobility   Pt refused mobility d/t mild pain and their "prerogative". Will f/u as able.    Andrey Campanile Mobility Specialist Please contact via SecureChat or  Rehab office at [phone]

## 2021-12-24 NOTE — Progress Notes (Signed)
PHARMACY - TOTAL PARENTERAL NUTRITION CONSULT NOTE   Indication:  abdominal abscess  Patient Measurements: Height: '5\' 5"'$  (165.1 cm) Weight: (!) 143.3 kg (315 lb 14.7 oz) IBW/kg (Calculated) : 57  Adjusted Body Weight: 91 kg - used for nutrition calculations Body mass index is 52.57 kg/m. Usual Weight: ~141 kg  Assessment:  51 yo F with PMH of HF, HTN, HLD, seizures, T2DM, anxiety, bipolar, depression, pulmonary hypertension, and obesity presented to the ED on 11/13 with abdominal pain and found to have pneumoperitoneum on CT. Pt went to OR on 11/13 and found to have perforated sigmoid diverticulitis w/ peritonitis and abscess and underwent laprascopic peritoneal lavage w/ drain placement and was initiated on Zosyn for E. coli and  Bacteroides thetaiotaomicron from the wound culture and Bacteroides thetaioatomicron bacteremia. On 11/17, pt was gradually restarted on CLD, advanced to soft diet on 11/18. Drain output became purulent on 11/20 and CT abdomen showed multiple large intraabdominal fluid collections and pt was made NPO on 11/21. Pt underwent CT-guided placement of 4 abdominal drains with feculent, foul smelling drainage output in IR on 11/21, concerning for abdominal abscess. Micafungin was added to Zosyn for coverage of yeast in addition to Bacteroides and Clostridium perfringens growing from abdominal drainage culture on 11/21. Pharmacy has been consulted to initiate and manage TPN to allow for perforated sigmoid to heal over.  Surgery discussing possible diverting colostomy sometime this week.  Glucose / Insulin: cBG <200, A1c 5.9% (10/23), rSSI requiring 14 units/24h Electrolytes: WNL, coCa 10.7 Renal: SCr <1, stable Hepatic: albumin 1.5, LFTs/AlkPhos/Tbili WNL, TG 146 Intake / Output; MIVF: abdominal drain output (total): 197.17m; LBM: 11/26 (3x/24h); UOP not documented GI Imaging: 11/13 CT abd: sigmoid colitis and colonic perforation, likely ileus in mid jejunum 11/20 CT abd:  multifocal intra-abdominal abscesses in the setting of perforated viscus, small volume perihepatic ascites, cholelithiasis GI Surgeries / Procedures:  11/13: laparoscopic peritoneal lavage w/ drain placement 11/21: CT-guided placement of 4 abdominal drains by IR  Central access: PICC placed 11/25 TPN start date: 12/23/21  Nutritional Goals: Goal TPN rate is 100 mL/hr (provides 130 g of protein and 2313 kcals per day)  RD Assessment: Estimated Needs Total Energy Estimated Needs: 2200-2400 Total Protein Estimated Needs: 115-135 grams Total Fluid Estimated Needs: >2.0 L  Current Nutrition:  Clear liquids and TPN  Plan:  Increase TPN to goal rate of 1076mhr at 1800 to provide 130g protein and 2313 kcal/day, meeting ~100% of daily nutritional needs.  Note: Lipids have NOT been added to TPN in setting of active fungal infection.  Electrolytes in TPN: Na 5048mL, K 67m44m, Ca 5mEq55m Mg 8mEq/70mand Phos 15mmol41mCl:Ac 1:1 Add standard MVI and trace elements to TPN Continue Resistant q4h SSI and adjust as needed  Discontinue MIVF at 17:59 Give additional KCl IV 10mEq x57mtside of TPN Monitor TPN labs on Mon/Thurs, and daily until stable on TPN. F/u surgical plans   Amiri Tritch JaLuisa Hart, BCPS Clinical Pharmacist 12/24/2021 7:17 AM   Please refer to AMION [facility] phone number

## 2021-12-24 NOTE — Plan of Care (Signed)
  Problem: Clinical Measurements: Goal: Will remain free from infection Outcome: Not Progressing   Problem: Safety: Goal: Ability to remain free from injury will improve Outcome: Not Progressing

## 2021-12-25 DIAGNOSIS — K668 Other specified disorders of peritoneum: Secondary | ICD-10-CM | POA: Diagnosis not present

## 2021-12-25 LAB — COMPREHENSIVE METABOLIC PANEL
ALT: 12 U/L (ref 0–44)
AST: 19 U/L (ref 15–41)
Albumin: <1.5 g/dL — ABNORMAL LOW (ref 3.5–5.0)
Alkaline Phosphatase: 74 U/L (ref 38–126)
Anion gap: 9 (ref 5–15)
BUN: 7 mg/dL (ref 6–20)
CO2: 28 mmol/L (ref 22–32)
Calcium: 8.4 mg/dL — ABNORMAL LOW (ref 8.9–10.3)
Chloride: 102 mmol/L (ref 98–111)
Creatinine, Ser: 0.58 mg/dL (ref 0.44–1.00)
GFR, Estimated: >60 mL/min (ref >60)
Glucose, Bld: 202 mg/dL — ABNORMAL HIGH (ref 70–99)
Potassium: 3.8 mmol/L (ref 3.5–5.1)
Sodium: 139 mmol/L (ref 135–145)
Total Bilirubin: 0.4 mg/dL (ref 0.3–1.2)
Total Protein: 5.3 g/dL — ABNORMAL LOW (ref 6.5–8.1)

## 2021-12-25 LAB — GLUCOSE, CAPILLARY
Glucose-Capillary: 144 mg/dL — ABNORMAL HIGH (ref 70–99)
Glucose-Capillary: 165 mg/dL — ABNORMAL HIGH (ref 70–99)
Glucose-Capillary: 189 mg/dL — ABNORMAL HIGH (ref 70–99)
Glucose-Capillary: 190 mg/dL — ABNORMAL HIGH (ref 70–99)

## 2021-12-25 LAB — CBC
HCT: 31.9 % — ABNORMAL LOW (ref 36.0–46.0)
Hemoglobin: 10.3 g/dL — ABNORMAL LOW (ref 12.0–15.0)
MCH: 30 pg (ref 26.0–34.0)
MCHC: 32.3 g/dL (ref 30.0–36.0)
MCV: 93 fL (ref 80.0–100.0)
Platelets: 573 10*3/uL — ABNORMAL HIGH (ref 150–400)
RBC: 3.43 MIL/uL — ABNORMAL LOW (ref 3.87–5.11)
RDW: 15.2 % (ref 11.5–15.5)
WBC: 10.4 10*3/uL (ref 4.0–10.5)
nRBC: 0 % (ref 0.0–0.2)

## 2021-12-25 LAB — MAGNESIUM: Magnesium: 1.9 mg/dL (ref 1.7–2.4)

## 2021-12-25 LAB — PHOSPHORUS: Phosphorus: 3.4 mg/dL (ref 2.5–4.6)

## 2021-12-25 MED ORDER — TRAVASOL 10 % IV SOLN
INTRAVENOUS | Status: AC
  Filled 2021-12-25: qty 1296

## 2021-12-25 MED ORDER — PSYLLIUM 95 % PO PACK
1.0000 | PACK | Freq: Every day | ORAL | Status: DC
  Administered 2021-12-25 – 2022-01-05 (×10): 1 via ORAL
  Filled 2021-12-25 (×12): qty 1

## 2021-12-25 NOTE — Progress Notes (Signed)
PROGRESS NOTE    [name]  PQZ:[id number] DOB: [birth year] DOA: 12/11/2021 PCP: [place], Betty G, MD    Brief Narrative:   [name] is a 51 y.o. female with past medical history significant for OSA/OHS on CPAP, bipolar 1 disorder, Hx chronic systolic congestive heart failure/cardiomyopathy who presented to [facility] ED on 11/13 with abdominal pain.  In the ED, CT abdomen/pelvis notable for free air and fluid.  General surgery was consulted and patient was taken emergently to the operating room with finding of a well-formed abscess cavity in the lower mid abdomen near the sigmoid colon with purulent peritonitis and fibrinous exudate suspected secondary to perforated sigmoid diverticulitis.  Patient had laparoscopic peritoneal lavage with drains placed and patient was transferred to the intensive care unit postoperatively as she remained intubated but was successfully extubated the following day.  Given continued purulence from drain output, repeat CT abdomen/pelvis was performed on 11/20 with findings of multiple fluid and gas collections; IR was consulted on 11/21 for repeat drain placement.  Assessment & Plan:   Abdominal abscess/peritonitis 2/2 presumed perforated sigmoid diverticulitis Patient presenting to ED with abdominal pain, CT abdomen/pelvis notable for free air and fluid.  General surgery was consulted and patient was taken emergently to the OR with findings of a well-formed abscess cavity lower mid abdomen near the sigmoid colon with purulent peritonitis and fibrinous exudate suspected secondary to a perforated sigmoid diverticulitis; and underwent laparoscopic peritoneal lavage with drain placement.  Repeat CT abdomen/pelvis 7/20 with multiple air-fluid levels and general surgery consulted IR for further drain placement under IR guidance.  Repeat CT abdomen/pelvis 11/26 with significant decompression of the pelvic collections, persistent 4 cm gas and fluid collection  posterior uterus which was previously 6.2 cm. -- General surgery following, appreciate assistance -- WBC 5.6>>18.8>>12.5>13.3>12.9>12.3>11.6>11.8>11.6 -- Blood cultures 11/13: + Bacteroides thetaiotaomicron -- Operative culture 11/13: E. coli, abundant Bacteroides thetaiotaomicron -- Culture from IR drain placement 11/21: Abundant Bacteroides, Clostridium perfringens, Candida albicans -- Zosyn 3.375 g IV every 8 hours -- Fluconazole '400mg'$  IV q24h -- Continue to monitor drain output closely (currently 7 drains; 3 placed by CCS and 4 placed by IR); of which lower quadrant drains continue with significant feculent output -- General surgery to consider diverting colostomy this week -- Clear liquid diet, continue TPN -- CBC daily  OSA/OHS Follows with pulmonology outpatient, Dr. Elsworth Soho. -- Continue nocturnal CPAP  Hx chronic systolic/diastolic congestive heart failure/cardiomyopathy Follows with cardiology outpatient, Dr. Percival Spanish.  Most recent echocardiogram February 2022 with now improved LVEF of 60 to 33%, grade 1 diastolic dysfunction --Continue to hold home Lasix  Hx seizures, bipolar disorder, depression --Continue alprazolam '1mg'$  PO TID, Lyrica '300mg'$  PO BID, Vraylar '6mg'$  PO qHS  Acute renal failure Etiology likely secondary to infection versus dehydration from abdominal abscess/peritonitis as above.  Home Lasix on hold, supported with IV fluids with now normalization of creatinine, 0.79. --Continue intermittent monitoring of renal function  GERD: --Protonix 40 mg p.o. twice daily  Morbid obesity Body mass index is 52.57 kg/m.  Discussed with patient needs for aggressive lifestyle changes/weight loss as this complicates all facets of care.  Outpatient follow-up with PCP.  May benefit from bariatric evaluation outpatient.   DVT prophylaxis: heparin injection 5,000 Units Start: 12/12/21 0600    Code Status: Full Code Family Communication: Updated sister-in-law [name] yesterday  morning, no family present at bedside this morning Disposition Plan:  Level of care: Med-Surg Status is: Inpatient Remains inpatient appropriate because: IV antibiotics, anticipate need  of diverting colostomy this week    Consultants:  General surgery Interventional radiology  Procedures:  Laparoscopic peritoneal lavage with drain placement x 3, Dr. Thermon Leyland; 11/13 CT guided drain placement x 4; IR, 11/21, Dr. Pascal Lux PICC line 11/25  Antimicrobials:  Zosyn 11/13>> Micafungin 11/22 - 11/24 Fluconazole 11/25>> Metronidazole 11/13 - 11/13   Subjective: Patient seen and examined bedside, resting comfortably.  Lying in bed.  Continues with feculent drainage from lower quadrant drains.  Remains on IV antibiotics.  Continues on TPN, clear liquid diet.  Per discussion with general surgery this weekend likely anticipate diverting colostomy this week.  Patient denies headache, no fever/chills/night sweats, no nausea/vomiting, no diarrhea, no chest pain, no palpitations, no shortness of breath, no abdominal pain, no cough/congestion.  No acute events overnight per nursing staff.  Objective: Vitals:   12/24/21 1643 12/24/21 2053 12/25/21 0420 12/25/21 0949  BP: (!) 180/84 (!) 160/71 139/85 (!) 154/79  Pulse: 84 86 82 (!) 105  Resp: '18 18 18 18  '$ Temp: 98.3 F (36.8 C) 97.9 F (36.6 C) 97.9 F (36.6 C) 98.3 F (36.8 C)  TempSrc: Oral Oral Oral   SpO2: 95% 93% 95% 90%  Weight:      Height:        Intake/Output Summary (Last 24 hours) at 12/25/2021 1029 Last data filed at 12/25/2021 1000 Gross per 24 hour  Intake 1778.16 ml  Output 155 ml  Net 1623.16 ml   Filed Weights   12/11/21 2114 12/18/21 0432  Weight: (!) 138.6 kg (!) 143.3 kg    Examination:  Physical Exam: GEN: NAD, alert and oriented x 3, obese; appears older than stated age, chronically ill in appearance HEENT: NCAT, PERRL, EOMI, sclera clear, MMM PULM: CTAB w/o wheezes/crackles, normal respiratory effort, on  room air CV: RRR w/o M/G/R GI: abd soft, mild lower abdominal pain on palpation, NABS, no R/G/M; abdominal drains x 7 noted with continued feculent output MSK: no peripheral edema, moves all extremities independently NEURO: CN II-XII intact, no focal deficits, sensation to light touch intact PSYCH: normal mood/affect Integumentary: Abdominal drains/surgical incision sites noted, otherwise no other concerning rashes/lesions/wounds noted on exposed skin surfaces.      Data Reviewed: I have personally reviewed following labs and imaging studies  CBC: Recent Labs  Lab 12/21/21 0752 12/22/21 0320 12/23/21 0139 12/24/21 0412 12/25/21 0411  WBC 11.6* 11.8* 12.1* 11.6* 10.4  HGB 12.1 11.0* 11.0* 10.8* 10.3*  HCT 36.6 34.6* 33.9* 33.1* 31.9*  MCV 92.7 93.8 91.6 93.0 93.0  PLT 550* 560* 705* 657* 007*   Basic Metabolic Panel: Recent Labs  Lab 12/20/21 0416 12/20/21 2333 12/22/21 0320 12/23/21 0139 12/24/21 0412 12/25/21 0411  NA 141 139 141 142 141 139  K 3.7 4.2 4.0 4.7 3.6 3.8  CL 101 101 100 101 100 102  CO2 '29 27 26 28 30 28  '$ GLUCOSE 129* 137* 151* 123* 194* 202*  BUN '8 8 8 8 6 7  '$ CREATININE 0.79 0.77 0.72 0.60 0.65 0.58  CALCIUM 8.7* 8.6* 8.9 8.9 8.7* 8.4*  MG 2.2 2.0 1.8  --  1.9 1.9  PHOS  --   --   --   --  3.5 3.4   GFR: Estimated Creatinine Clearance: 118.8 mL/min (by C-G formula based on SCr of 0.58 mg/dL). Liver Function Tests: Recent Labs  Lab 12/23/21 0923 12/24/21 0412 12/25/21 0411  AST '25 20 19  '$ ALT '14 11 12  '$ ALKPHOS 89 87 74  BILITOT 0.3 <0.1* 0.4  PROT 5.7* 5.4* 5.3*  ALBUMIN 1.7* 1.5* <1.5*   No results for input(s): "LIPASE", "AMYLASE" in the last 168 hours. No results for input(s): "AMMONIA" in the last 168 hours. Coagulation Profile: No results for input(s): "INR", "PROTIME" in the last 168 hours. Cardiac Enzymes: No results for input(s): "CKTOTAL", "CKMB", "CKMBINDEX", "TROPONINI" in the last 168 hours. BNP (last 3 results) No results  for input(s): "PROBNP" in the last 8760 hours. HbA1C: No results for input(s): "HGBA1C" in the last 72 hours. CBG: Recent Labs  Lab 12/24/21 0730 12/24/21 1153 12/24/21 1642 12/24/21 2346 12/25/21 0504  GLUCAP 174* 249* 171* 191* 189*   Lipid Profile: Recent Labs    12/24/21 0412  TRIG 146   Thyroid Function Tests: No results for input(s): "TSH", "T4TOTAL", "FREET4", "T3FREE", "THYROIDAB" in the last 72 hours. Anemia Panel: No results for input(s): "VITAMINB12", "FOLATE", "FERRITIN", "TIBC", "IRON", "RETICCTPCT" in the last 72 hours. Sepsis Labs: No results for input(s): "PROCALCITON", "LATICACIDVEN" in the last 168 hours.  Recent Results (from the past 240 hour(s))  Aerobic/Anaerobic Culture w Gram Stain (surgical/deep wound)     Status: None   Collection Time: 12/19/21  4:58 PM   Specimen: Wound; Abscess  Result Value Ref Range Status   Specimen Description WOUND  Final   Special Requests POST CT GUIDED DRAIN PLACEMENT  Final   Gram Stain   Final    FEW WBC PRESENT, PREDOMINANTLY PMN MODERATE GRAM POSITIVE RODS FEW GRAM NEGATIVE RODS RARE BUDDING YEAST SEEN    Culture   Final    ABUNDANT CANDIDA ALBICANS ABUNDANT BACTEROIDES SPECIES NOT FRAGILIS BETA LACTAMASE POSITIVE FEW CLOSTRIDIUM PERFRINGENS Standardized susceptibility testing for this organism is not available. Performed at [facility], [place] [address], [place], [place] [address]    Report Status 12/22/2021 FINAL  Final         Radiology Studies: CT ABDOMEN PELVIS W CONTRAST  Result Date: 12/24/2021 CLINICAL DATA:  Abdominal pain postop EXAM: CT ABDOMEN AND PELVIS WITH CONTRAST TECHNIQUE: Multidetector CT imaging of the abdomen and pelvis was performed using the standard protocol following bolus administration of intravenous contrast. RADIATION DOSE REDUCTION: This exam was performed according to the departmental dose-optimization program which includes automated exposure control, adjustment of  the mA and/or kV according to patient size and/or use of iterative reconstruction technique. CONTRAST:  115m OMNIPAQUE IOHEXOL 350 MG/ML SOLN COMPARISON:  12/18/2021 FINDINGS: Lower chest: New small right pleural effusion. Dependent atelectasis/consolidation of the lung bases right greater than left. Hepatobiliary: Gallbladder is physiologically distended with at least 1 5 mm calcified stone in its dependent aspect. No focal liver lesion or biliary ductal dilatation. Pancreas: Unremarkable. No pancreatic ductal dilatation or surrounding inflammatory changes. Spleen: Normal in size without focal abnormality. Adrenals/Urinary Tract: Adrenal glands are unremarkable. Kidneys are normal, without renal calculi, focal lesion, or hydronephrosis. Bladder is unremarkable. Stomach/Bowel: Stomach is physiologically distended, unremarkable. Small bowel is decompressed with good distal passage of oral contrast material. Appendix not identified. The colon is nondilated, unremarkable. Vascular/Lymphatic: Mild scattered aortoiliac calcified atheromatous plaque without aneurysm. No abdominal or pelvic adenopathy. Reproductive: Uterus and bilateral adnexa are unremarkable. Other: Stable left upper abdominal surgical drain terminating inferior to the left lobe of the liver. Left parasagittal surgical drain extends to the left lower quadrant as before. Right mid abdominal surgical drain extends to the anterior pelvis as before. Interval pigtail drain placement in right infrahepatic fluid collection with significant decompression, residual gas and fluid pocket 4.8 x 1.5 cm. Interval pigtail  drain placement into left lower quadrant collection with significant decompression, 1.5 cm residual gas and fluid pocket. Interval pigtail drain placement into left pelvic fluid collection, with significant decompression, 1.6 cm gas and fluid pocket medial to the drain. Interval pigtail drain placement into the right pelvic gas and fluid collection,  virtually completely evacuated. Persistent 4 cm gas and fluid collection posterior to the uterus, previously 6.2 cm. There is linear gas in the left lateral abdominal wall musculature, new since previous. Musculoskeletal: Bilateral hip DJD. Bilateral sacroiliitis. Spondylitic changes throughout the lower thoracic and lumbar spine. No acute findings. IMPRESSION: 1. Interval pigtail drain placements into right infrahepatic, left lower quadrant, left pelvic, and right pelvic collections with significant decompression. 2. Persistent 4 cm gas and fluid collection posterior to the uterus, previously 6.2 cm. 3. New small right pleural effusion with bibasilar atelectasis/consolidation. 4. Cholelithiasis. Aortic Atherosclerosis (ICD10-I70.0). Electronically Signed   By: Lucrezia Europe M.D.   On: 12/24/2021 20:09        Scheduled Meds:  acetaminophen  1,000 mg Oral Q6H   ALPRAZolam  1 mg Oral TID   amphetamine-dextroamphetamine  20 mg Oral TID   cariprazine  6 mg Oral QPM   Chlorhexidine Gluconate Cloth  6 each Topical Daily   Dextromethorphan-quiNIDine  1 capsule Oral Q12H   heparin injection (subcutaneous)  5,000 Units Subcutaneous Q8H   insulin aspart  0-20 Units Subcutaneous Q6H   lidocaine  20 mL Intradermal Once   methocarbamol  1,000 mg Oral Q8H   mouth rinse  15 mL Mouth Rinse 4 times per day   pantoprazole  40 mg Oral BID   pregabalin  300 mg Oral BID   sodium chloride flush  5 mL Intracatheter Q8H   Continuous Infusions:  fluconazole (DIFLUCAN) IV 400 mg (12/25/21 0852)   piperacillin-tazobactam (ZOSYN)  IV 3.375 g (12/25/21 0511)   TPN ADULT (ION) 100 mL/hr at 12/25/21 0511     LOS: 14 days    Time spent: 50 minutes spent on chart review, discussion with nursing staff, consultants, updating family and interview/physical exam; more than 50% of that time was spent in counseling and/or coordination of care.    Keenya Matera J [place] DO [facility] Available via Epic secure chat  7am-7pm After these hours, please refer to coverage provider listed on [email or web address] 12/25/2021, 10:29 AM

## 2021-12-25 NOTE — Plan of Care (Signed)

## 2021-12-25 NOTE — Plan of Care (Signed)
  Problem: Clinical Measurements: Goal: Will remain free from infection Outcome: Not Progressing   Problem: Clinical Measurements: Goal: Respiratory complications will improve Outcome: Not Progressing   Problem: Nutrition: Goal: Adequate nutrition will be maintained Outcome: Not Progressing   Problem: Elimination: Goal: Will not experience complications related to bowel motility Outcome: Not Progressing

## 2021-12-25 NOTE — Progress Notes (Signed)
Mobility Specialist Progress Note:   12/25/21 1427  Mobility  Activity Stood at bedside  Level of Assistance Minimal assist, patient does 75% or more  Assistive Device Front wheel walker  Activity Response Tolerated well  Mobility Referral Yes  $Mobility charge 1 Mobility   Pt received in bed and agreeable with max encouragement. No complaints. Required MinA physical assistance for bed mobility and to come to seated position EOB. No physical assistance given to stand from EOB. Declined ambulation, no reason given.  Pt left in bed with all needs met and call bell in reach.   Andrey Campanile Mobility Specialist Please contact via SecureChat or  Rehab office at [phone]

## 2021-12-25 NOTE — Progress Notes (Addendum)
PHARMACY - TOTAL PARENTERAL NUTRITION CONSULT NOTE   Indication:  abdominal abscess  Patient Measurements: Height: '5\' 5"'$  (165.1 cm) Weight: (!) 143.3 kg (315 lb 14.7 oz) IBW/kg (Calculated) : 57  Adjusted Body Weight: 91 kg - used for nutrition calculations Body mass index is 52.57 kg/m. Usual Weight: ~141 kg  Assessment:  51 yo F with PMH of HF, HTN, HLD, seizures, T2DM, anxiety, bipolar, depression, pulmonary hypertension, and obesity presented to the ED on 11/13 with abdominal pain and found to have pneumoperitoneum on CT. Pt went to OR on 11/13 and found to have perforated sigmoid diverticulitis w/ peritonitis and abscess and underwent laprascopic peritoneal lavage w/ drain placement and was initiated on Zosyn for E. coli and  Bacteroides thetaiotaomicron from the wound culture and Bacteroides thetaioatomicron bacteremia. On 11/17, pt was gradually restarted on CLD, advanced to soft diet on 11/18. Drain output became purulent on 11/20 and CT abdomen showed multiple large intraabdominal fluid collections and pt was made NPO on 11/21. Pt underwent CT-guided placement of 4 abdominal drains with feculent, foul smelling drainage output in IR on 11/21, concerning for abdominal abscess. Micafungin was added to Zosyn for coverage of yeast in addition to Bacteroides and Clostridium perfringens growing from abdominal drainage culture on 11/21. Pharmacy has been consulted to initiate and manage TPN to allow for perforated sigmoid to heal over.  Surgery discussing possible diverting colostomy sometime this week.  Glucose / Insulin: cBG <200, A1c 5.9% (10/23), rSSI requiring 12 units/24h Electrolytes: WNL, coCa 10.7 Renal: SCr <1, stable Hepatic: albumin <1.5, LFTs/AlkPhos/Tbili WNL, TG 146 Intake / Output; MIVF: abdominal drain output (total): 210m; LBM: 11/26 (3x/24h); UOP not documented GI Imaging: 11/13 CT abd: sigmoid colitis and colonic perforation, likely ileus in mid jejunum 11/20 CT abd:  multifocal intra-abdominal abscesses in the setting of perforated viscus, small volume perihepatic ascites, cholelithiasis 11/26 CT abd: - Interval pigtail drain placements into right infrahepatic, left lower quadrant, left pelvic, and right pelvic collections with significant decompression. - Persistent 4 cm gas and fluid collection posterior to the uterus, previously 6.2 cm. - New small right pleural effusion with bibasilar atelectasis/consolidation. - Cholelithiasis. GI Surgeries / Procedures:  11/13: laparoscopic peritoneal lavage w/ drain placement 11/21: CT-guided placement of 4 abdominal drains by IR  Central access: PICC placed 11/25 TPN start date: 12/23/21  Nutritional Goals: Goal TPN rate is 100 mL/hr (provides 130 g of protein and 2313 kcals per day)  RD Assessment: Estimated Needs Total Energy Estimated Needs: 2200-2400 Total Protein Estimated Needs: 115-135 grams Total Fluid Estimated Needs: >2.0 L  Current Nutrition:  TPN Regular diet 11/27  Plan:  Continue TPN at goal rate of 104mhr at 1800 to provide 130g protein and 2313 kcal/day, meeting ~100% of daily nutritional needs.  Note: Lipids have NOT been added to TPN in setting of active fungal infection.  Electrolytes in TPN: Na 503mL, K 27m69m, Ca 5mEq70m Mg 8mEq/64mand Phos 15mmol52mCl:Ac 1:1 Add standard MVI and trace elements to TPN Continue Resistant q4h SSI and adjust as needed  Monitor TPN labs on Mon/Thurs, and daily until stable on TPN. F/u surgical plans  F/u ability to wean TPN  Cathy PAlanda SlimD, FCCM [facility] Pharmacist Please see AMION for all Pharmacists' Contact Phone Numbers 12/25/2021, 11:14 AM    Please refer to AMION fB[facility] Hospitalarmacy phone number

## 2021-12-25 NOTE — Progress Notes (Signed)
Pt placed self on home CPAP unit. RT will continue to monitor.

## 2021-12-25 NOTE — Progress Notes (Signed)
General Surgery Follow Up Note  Subjective:    Overnight Issues:   Objective:  Vital signs for last 24 hours: Temp:  [97.5 F (36.4 C)-98.3 F (36.8 C)] 97.5 F (36.4 C) (11/27 1654) Pulse Rate:  [82-107] 107 (11/27 1654) Resp:  [18] 18 (11/27 1654) BP: (139-160)/(71-88) 160/88 (11/27 1654) SpO2:  [90 %-95 %] 95 % (11/27 1654)  Hemodynamic parameters for last 24 hours:    Intake/Output from previous day: 11/26 0701 - 11/27 0700 In: 1598.2 [P.O.:715; I.V.:828.2] Out: 215 [Drains:215]  Intake/Output this shift: No intake/output data recorded.  Vent settings for last 24 hours:    Physical Exam:  Gen: comfortable, no distress Neuro: non-focal exam HEENT: PERRL Neck: supple CV: RRR Pulm: unlabored breathing Abd: soft, NT, drains x7, one feculent GU: clear yellow urine Extr: wwp, no edema   Results for orders placed or performed during the hospital encounter of 12/11/21 (from the past 24 hour(s))  Glucose, capillary     Status: Abnormal   Collection Time: 12/24/21 11:46 PM  Result Value Ref Range   Glucose-Capillary 191 (H) 70 - 99 mg/dL  Comprehensive metabolic panel     Status: Abnormal   Collection Time: 12/25/21  4:11 AM  Result Value Ref Range   Sodium 139 135 - 145 mmol/L   Potassium 3.8 3.5 - 5.1 mmol/L   Chloride 102 98 - 111 mmol/L   CO2 28 22 - 32 mmol/L   Glucose, Bld 202 (H) 70 - 99 mg/dL   BUN 7 6 - 20 mg/dL   Creatinine, Ser 0.58 0.44 - 1.00 mg/dL   Calcium 8.4 (L) 8.9 - 10.3 mg/dL   Total Protein 5.3 (L) 6.5 - 8.1 g/dL   Albumin <1.5 (L) 3.5 - 5.0 g/dL   AST 19 15 - 41 U/L   ALT 12 0 - 44 U/L   Alkaline Phosphatase 74 38 - 126 U/L   Total Bilirubin 0.4 0.3 - 1.2 mg/dL   GFR, Estimated >60 >60 mL/min   Anion gap 9 5 - 15  Magnesium     Status: None   Collection Time: 12/25/21  4:11 AM  Result Value Ref Range   Magnesium 1.9 1.7 - 2.4 mg/dL  Phosphorus     Status: None   Collection Time: 12/25/21  4:11 AM  Result Value Ref Range    Phosphorus 3.4 2.5 - 4.6 mg/dL  CBC     Status: Abnormal   Collection Time: 12/25/21  4:11 AM  Result Value Ref Range   WBC 10.4 4.0 - 10.5 K/uL   RBC 3.43 (L) 3.87 - 5.11 MIL/uL   Hemoglobin 10.3 (L) 12.0 - 15.0 g/dL   HCT 31.9 (L) 36.0 - 46.0 %   MCV 93.0 80.0 - 100.0 fL   MCH 30.0 26.0 - 34.0 pg   MCHC 32.3 30.0 - 36.0 g/dL   RDW 15.2 11.5 - 15.5 %   Platelets 573 (H) 150 - 400 K/uL   nRBC 0.0 0.0 - 0.2 %  Glucose, capillary     Status: Abnormal   Collection Time: 12/25/21  5:04 AM  Result Value Ref Range   Glucose-Capillary 189 (H) 70 - 99 mg/dL  Glucose, capillary     Status: Abnormal   Collection Time: 12/25/21 11:32 AM  Result Value Ref Range   Glucose-Capillary 190 (H) 70 - 99 mg/dL  Glucose, capillary     Status: Abnormal   Collection Time: 12/25/21  4:52 PM  Result Value Ref Range  Glucose-Capillary 144 (H) 70 - 99 mg/dL    Assessment & Plan: The plan of care was discussed with the bedside nurse for the day, who is in agreement with this plan and no additional concerns were raised.   Present on Admission:  Anxiety state  Bipolar I disorder ([place])  Chronic combined systolic and diastolic CHF (congestive heart failure) ([facility])  Essential hypertension, benign  Hyperlipidemia  Morbid obesity ([employer])  Type 2 diabetes mellitus with other specified complication ([place])    LOS: 14 days   Additional comments:I reviewed the patient's new clinical lab test results.   and I reviewed the patients new imaging test results.    POD 14 s/p diagnostic lap washout, evacuation of abscess, and JP drain placement x2, Dr. Thermon Leyland 11/13 -CT 11/20 showed multiple loculated air and fluid collections throughout the abdomen and pelvis. CT 11/26 reviewed, now with seven drains. Continue antibiotics for now. This was likely perforated diverticultitis from beginning and may just actually need colectomy and/or colostomy, but she has reached plateau it looks like and may be able to be managed  just with drains, at least one of which is clearly feculent but low output. WBC normal and abdominal exam stable. Adv diet as tolerated. -mobilize, PT   FEN - soft VTE - sq heparin ID - currently zosyn   GNR bacteremia  HTN Morbid obesity OSA CHF Bipolar disorder HLD Seizures  Jesusita Oka, MD Trauma & General Surgery Please use [email or web address] to contact on call provider  12/25/2021  *Care during the described time interval was provided by me. I have reviewed this patient's available data, including medical history, events of note, physical examination and test results as part of my evaluation.

## 2021-12-25 NOTE — Progress Notes (Signed)
  Referring Physician(s): Dr. Matthew Martin  Supervising Physician: Mugweru, Jon  Patient Status:  [facility] - In-pt  Chief Complaint: Perforated diverticulitis s/p ex lap peritoneal lavage with drain placement on 11/13, f/u CT on 11/20 showed persistent intraabdominal fluid collections  S/p RUQ, RLQ, LUQ, LLQ drain placement by Dr. Watts on 11/22   Subjective: Pt laying in bed, NAD. States that she is going ok, denies abd pain, n/v.   Allergies: Savella [milnacipran hcl]  Medications: Prior to Admission medications   Medication Sig Start Date End Date Taking? Authorizing Provider  acetaminophen (TYLENOL) 500 MG tablet Take 1,000 mg by mouth every 6 (six) hours as needed for moderate pain.    Yes [provider]  albuterol (VENTOLIN HFA) 108 (90 Base) MCG/ACT inhaler Inhale 2 puffs into the lungs every 6 (six) hours as needed for wheezing or shortness of breath. 10/06/20  Yes Alva, Rakesh V, MD  ALPRAZolam (XANAX) 1 MG tablet Take 1 mg by mouth 3 (three) times daily.   Yes [provider]  amphetamine-dextroamphetamine (ADDERALL) 20 MG tablet Take 20 mg by mouth in the morning, at noon, in the evening, and at bedtime.    Yes [provider]  Ascorbic Acid (VITAMIN C) 500 MG tablet Take 1,000 mg by mouth daily.   Yes [provider]  aspirin 81 MG tablet Take 1 tablet (81 mg total) by mouth daily. 05/21/19  Yes Costella, Vincent J, PA-C  atorvastatin (LIPITOR) 20 MG tablet TAKE 1 TABLET BY MOUTH EVERY DAY IN THE EVENING Patient taking differently: Take 20 mg by mouth daily. 03/15/21  Yes [name], James, MD  b complex vitamins tablet Take 1 tablet by mouth daily.   Yes [provider]  Calcium Citrate (CITRACAL PO) Take 2 tablets by mouth daily.   Yes [provider]  Cholecalciferol (VITAMIN D3) 5000 UNITS TABS Take 10,000 Units by mouth in the morning and at bedtime.   Yes [provider]  Chromium Picolinate 500 MCG TABS Take  500 mcg by mouth daily.    Yes [provider]  diclofenac Sodium (VOLTAREN) 1 % GEL Apply 2 g topically 3 (three) times daily as needed (knee pain).   Yes [provider]  furosemide (LASIX) 40 MG tablet Take 2 tablets (80 mg total) by mouth 2 (two) times daily. Patient taking differently: Take 40-80 mg by mouth See admin instructions. Take 80 mg in the morning and 40 mg in the evening 12/05/21  Yes [name], James, MD  [name] 1.5/30 1.5-30 MG-MCG tablet Take 1 tablet by mouth at bedtime. 08/03/19  Yes [provider]  levalbuterol (XOPENEX HFA) 45 MCG/ACT inhaler Inhale 2 puffs into the lungs every 6 (six) hours as needed for wheezing. 10/14/20  Yes Alva, Rakesh V, MD  lidocaine (LIDODERM) 5 % PLACE 1 PATCH ONTO THE SKIN DAILY AS NEEDED FOR KNEE PAIN. REMOVE AND DISCARD PATCH WITHIN 12 HOURS OR AS DIRECTED BY DOCTOR. Patient taking differently: Place 1 patch onto the skin as needed (knee pain). 05/22/21  Yes Jordan, Betty G, MD  losartan (COZAAR) 25 MG tablet Take 1 tablet (25 mg total) by mouth 2 (two) times daily. 12/05/21  Yes [name], James, MD  LYRICA 300 MG capsule Take 300 mg by mouth 2 (two) times daily.  03/26/11  Yes [provider]  MAGNESIUM PO Take 500 mg by mouth daily.    Yes [provider]  Multiple Vitamin (MULTIVITAMIN) tablet Take 1 tablet by mouth daily.     Yes [provider]  NUEDEXTA 20-10 MG CAPS Take 1 capsule by mouth every 12 (twelve) hours.  09/27/16  Yes [provider]  Omega-3 Fatty Acids (FISH OIL) 1200 MG CAPS Take 2,400 mg by mouth daily.    Yes [provider]  potassium chloride SA (KLOR-CON M20) 20 MEQ tablet Take 1 tablet (20 mEq total) by mouth daily. 04/14/21  Yes [name], Jeneen Rinks, MD  VRAYLAR 6 MG CAPS Take 6 mg by mouth every evening.  09/03/16  Yes [provider]  Zinc 50 MG CAPS Take 50 mg by mouth in the morning and at bedtime.   Yes [provider]  Blood Glucose Monitoring  Suppl (ACCU-CHEK AVIVA PLUS) w/Device KIT As directed. 11/14/21   Martinique, Betty G, MD  methocarbamol (ROBAXIN-750) 750 MG tablet Take 1 tablet (750 mg total) by mouth 3 (three) times daily as needed for muscle spasms. Patient not taking: Reported on 12/11/2021 05/17/19   Traci Sermon, PA-C     Vital Signs: BP (!) 154/79 (BP Location: Left Arm)   Pulse (!) 105   Temp 98.3 F (36.8 C)   Resp 18   Ht 5' 5" (1.651 m)   Wt (!) 315 lb 14.7 oz (143.3 kg)   SpO2 90%   BMI 52.57 kg/m   Physical Exam Vitals reviewed.  Constitutional:      General: She is not in acute distress.    Appearance: She is obese. She is ill-appearing.  Pulmonary:     Effort: Pulmonary effort is normal.  Skin:    General: Skin is warm.     Coloration: Skin is not cyanotic or jaundiced.     Comments: RUQ, RLQ, LUQ, LLQ drains in placed   RLQ - site with minimal leakage around the insertion site, flushes/aspirates well, minimal feculent/purulent fluid in the gravity bag   RUQ - site clean, dry, minimal thin fluid with debris in the gravity bag. Flushes well, does not aspirate much   LUQ (not really upper quadrant but superior to the other drain) - site c/d, minimal thin fluid with debris in the gravity bag. Flushes well, does not aspirate  LLQ - site clean, dry, feculent/purulent fluid in the gravity bag. F/A well.     Neurological:     Mental Status: She is alert.    NAD, alert      Imaging: CT ABDOMEN PELVIS W CONTRAST  Result Date: 12/24/2021 CLINICAL DATA:  Abdominal pain postop EXAM: CT ABDOMEN AND PELVIS WITH CONTRAST TECHNIQUE: Multidetector CT imaging of the abdomen and pelvis was performed using the standard protocol following bolus administration of intravenous contrast. RADIATION DOSE REDUCTION: This exam was performed according to the departmental dose-optimization program which includes automated exposure control, adjustment of the mA and/or kV according to patient size and/or use  of iterative reconstruction technique. CONTRAST:  161m OMNIPAQUE IOHEXOL 350 MG/ML SOLN COMPARISON:  12/18/2021 FINDINGS: Lower chest: New small right pleural effusion. Dependent atelectasis/consolidation of the lung bases right greater than left. Hepatobiliary: Gallbladder is physiologically distended with at least 1 5 mm calcified stone in its dependent aspect. No focal liver lesion or biliary ductal dilatation. Pancreas: Unremarkable. No pancreatic ductal dilatation or surrounding inflammatory changes. Spleen: Normal in size without focal abnormality. Adrenals/Urinary Tract: Adrenal glands are unremarkable. Kidneys are normal, without renal calculi, focal lesion, or hydronephrosis. Bladder is unremarkable. Stomach/Bowel: Stomach is physiologically distended, unremarkable. Small bowel is decompressed with good distal passage of oral contrast material. Appendix not identified. The colon is  nondilated, unremarkable. Vascular/Lymphatic: Mild scattered aortoiliac calcified atheromatous plaque without aneurysm. No abdominal or pelvic adenopathy. Reproductive: Uterus and bilateral adnexa are unremarkable. Other: Stable left upper abdominal surgical drain terminating inferior to the left lobe of the liver. Left parasagittal surgical drain extends to the left lower quadrant as before. Right mid abdominal surgical drain extends to the anterior pelvis as before. Interval pigtail drain placement in right infrahepatic fluid collection with significant decompression, residual gas and fluid pocket 4.8 x 1.5 cm. Interval pigtail drain placement into left lower quadrant collection with significant decompression, 1.5 cm residual gas and fluid pocket. Interval pigtail drain placement into left pelvic fluid collection, with significant decompression, 1.6 cm gas and fluid pocket medial to the drain. Interval pigtail drain placement into the right pelvic gas and fluid collection, virtually completely evacuated. Persistent 4 cm gas  and fluid collection posterior to the uterus, previously 6.2 cm. There is linear gas in the left lateral abdominal wall musculature, new since previous. Musculoskeletal: Bilateral hip DJD. Bilateral sacroiliitis. Spondylitic changes throughout the lower thoracic and lumbar spine. No acute findings. IMPRESSION: 1. Interval pigtail drain placements into right infrahepatic, left lower quadrant, left pelvic, and right pelvic collections with significant decompression. 2. Persistent 4 cm gas and fluid collection posterior to the uterus, previously 6.2 cm. 3. New small right pleural effusion with bibasilar atelectasis/consolidation. 4. Cholelithiasis. Aortic Atherosclerosis (ICD10-I70.0). Electronically Signed   By: Lucrezia Europe M.D.   On: 12/24/2021 20:09   [place] EKG SITE RITE  Result Date: 12/23/2021 If Site Rite image not attached, placement could not be confirmed due to current cardiac rhythm.   Labs:  CBC: Recent Labs    12/22/21 0320 12/23/21 0139 12/24/21 0412 12/25/21 0411  WBC 11.8* 12.1* 11.6* 10.4  HGB 11.0* 11.0* 10.8* 10.3*  HCT 34.6* 33.9* 33.1* 31.9*  PLT 560* 705* 657* 573*     COAGS: No results for input(s): "INR", "APTT" in the last 8760 hours.  BMP: Recent Labs    12/22/21 0320 12/23/21 0139 12/24/21 0412 12/25/21 0411  NA 141 142 141 139  K 4.0 4.7 3.6 3.8  CL 100 101 100 102  CO2 _0 GLUCOSE 151* 123* 194* 202*  BUN _1 CALCIUM 8.9 8.9 8.7* 8.4*  CREATININE 0.72 0.60 0.65 0.58  GFRNONAA >60 >60 >60 >60     LIVER FUNCTION TESTS: Recent Labs    12/11/21 1625 12/23/21 0923 12/24/21 0412 12/25/21 0411  BILITOT 0.5 0.3 <0.1* 0.4  AST 33 _2 ALT _3 ALKPHOS 109 89 87 74  PROT 6.0* 5.7* 5.4* 5.3*  ALBUMIN 2.0* 1.7* 1.5* <1.5*     Assessment and Plan: Multiple intra-abdominal fluid collections s/p drain placement x4   WBC wnl; afebrile  Cx candida albicans; clostridium perfringens   Output  10 Fr RUQ - 20 mL; thin  fluid with debris  12 Fr RLQ - 40 mL; purulent/ feculent  10 Fr LUQ - 15 mL; thin fluid with debris 12 Fr LLQ - 70 mL; purulent/feculent   Interval imaging/drain manipulation:  11/26: CT AP with   Current examination: Flushes easily, upper drains do not aspirate much ; lower drains flushes and aspirated well  Insertion site unremarkable except RLQ, with minimal thick leakage around the insertion site  Suture and stat lock in place. Dressed appropriately.   Plan: Continue TID flushes with 5 cc NS. Record output Q shift. Dressing changes QD or PRN if  soiled.  Call IR APP or on call IR MD if difficulty flushing or sudden change in drain output.  Repeat imaging/possible drain injection once output < 10 mL/QD (excluding flush material). Consideration for drain removal if output is < 10 mL/QD (excluding flush material), pending discussion with the providing surgical service.  Discharge planning: Please contact IR APP or on call IR MD prior to patient d/c to ensure appropriate follow up plans are in place. Typically patient will follow up with IR clinic 10-14 days post d/c for repeat imaging/possible drain injection. IR scheduler will contact patient with date/time of appointment. Patient will need to flush drain QD with 5 cc NS, record output QD, dressing changes every 2-3 days or earlier if soiled.   IR will continue to follow - please call with questions or concerns.   Electronically Signed:  H , PA-C 12/25/2021, 2:47 PM   I spent a total of 15 Minutes at the the patient's bedside AND on the patient's hospital floor or unit, greater than 50% of which was counseling/coordinating care for intra-abdominal fluid collections.       

## 2021-12-26 ENCOUNTER — Ambulatory Visit (INDEPENDENT_AMBULATORY_CARE_PROVIDER_SITE_OTHER): Payer: Medicare PPO | Admitting: Psychology

## 2021-12-26 DIAGNOSIS — K668 Other specified disorders of peritoneum: Secondary | ICD-10-CM | POA: Diagnosis not present

## 2021-12-26 DIAGNOSIS — F4321 Adjustment disorder with depressed mood: Secondary | ICD-10-CM | POA: Diagnosis not present

## 2021-12-26 LAB — BASIC METABOLIC PANEL
Anion gap: 10 (ref 5–15)
BUN: 8 mg/dL (ref 6–20)
CO2: 27 mmol/L (ref 22–32)
Calcium: 8.4 mg/dL — ABNORMAL LOW (ref 8.9–10.3)
Chloride: 101 mmol/L (ref 98–111)
Creatinine, Ser: 0.51 mg/dL (ref 0.44–1.00)
GFR, Estimated: >60 mL/min (ref >60)
Glucose, Bld: 194 mg/dL — ABNORMAL HIGH (ref 70–99)
Potassium: 4.3 mmol/L (ref 3.5–5.1)
Sodium: 138 mmol/L (ref 135–145)

## 2021-12-26 LAB — MAGNESIUM: Magnesium: 1.9 mg/dL (ref 1.7–2.4)

## 2021-12-26 LAB — GLUCOSE, CAPILLARY
Glucose-Capillary: 182 mg/dL — ABNORMAL HIGH (ref 70–99)
Glucose-Capillary: 185 mg/dL — ABNORMAL HIGH (ref 70–99)
Glucose-Capillary: 221 mg/dL — ABNORMAL HIGH (ref 70–99)
Glucose-Capillary: 203 mg/dL — ABNORMAL HIGH (ref 70–99)

## 2021-12-26 LAB — PHOSPHORUS: Phosphorus: 3.2 mg/dL (ref 2.5–4.6)

## 2021-12-26 MED ORDER — ENSURE ENLIVE PO LIQD
237.0000 mL | Freq: Two times a day (BID) | ORAL | Status: DC
  Administered 2021-12-26 – 2021-12-27 (×3): 237 mL via ORAL

## 2021-12-26 MED ORDER — TRAVASOL 10 % IV SOLN
INTRAVENOUS | Status: AC
  Filled 2021-12-26: qty 1296

## 2021-12-26 NOTE — Progress Notes (Signed)
Nutrition Follow-up  DOCUMENTATION CODES:   Morbid obesity  INTERVENTION:   - 48-hour calorie count, RD to follow up tomorrow 11/29 with day 1 results  - Ensure Enlive po BID, each supplement provides 350 kcal and 20 grams of protein  - TPN per Pharmacy  NUTRITION DIAGNOSIS:   Inadequate oral intake related to altered GI function as evidenced by other (clear liquid diet order).  Progressing, pt now on Regular diet  GOAL:   Patient will meet greater than or equal to 90% of their needs  Progressing  MONITOR:   PO intake, Supplement acceptance, Labs, Weight trends, I & O's  REASON FOR ASSESSMENT:   Consult New TPN/TNA  ASSESSMENT:   51 year old female who presented to the ED on 11/13 with abdominal pain. PMH of CHF, agoraphobia, anxiety, bipolar disorder, depression, HLD, HTN, IBS, seizures, OSA, obesity hypoventilation. Pt admitted with abscess cavity in the lower mid abdomen near the sigmoid colon with purulent peritonitis and fibrinous exudate throughout the abdomen suspected to be secondary to perforated sigmoid diverticulitis.  11/13 - s/p laparoscopic peritoneal lavage and evacuation of abscess with drain placement x 3 11/17 - clear liquid diet 11/18 - GI soft diet 11/21 - NPO, s/p placement of 12 Fr drain into dominant abdominal fluid collection in R lower abdomen/pelvis, s/p placement of 12 Fr drain into dominant abdominal fluid collection in L lower abdomen/pelvis, s/p placement of 10 Fr drain into fluid collection in R mid abdomen, s/p placement of 10 Fr drain into fluid collection in L mid abdomen, later advanced to regular diet 11/22 - NPO, later advanced to regular diet 11/25 - NPO, later advanced to clear liquid diet, TPN start 11/26 - TPN at goal 11/27 - regular diet  Consult received for 48-hour calorie count. TPN infusing at goal rate of 100 ml/hr which provides 2313 kcal and 130 grams of protein daily.  Discussed pt with RN. Calorie count envelope was  hung on pt's door. Plan to start calorie count with lunch meal today. Per RN, pt consumed 100% of an Ensure supplement this morning. RN unsure how pt did with breakfast.  Spoke with pt at bedside. Pt eating baked chips from lunch meal tray at time of RD visit. She reports decreased appetite but states that she was consuming a lot of clear liquids when on a clear liquid diet. Pt denies any N/V at this time. Noted Attending is concerned that restarting a diet with lead to re-accumulation of pelvic abscess with increased drain output.  Educated pt regarding calorie count and potential to wean TPN if pt taking in enough POs. Encouraged pt to consume oral nutrition supplements.  Admit weight: 138.6 kg Current weight: 143.3 kg  Meal Completion: 50-75% x last 5 documented meals  Medications reviewed and include: adderall, Ensure Enlive BID, SSI q 6 hours, protonix, psyllium, IV diflucan, IV abx, TPN  Labs reviewed. CBG's: 144-190 x 24 hours  19 Fr LUQ JP drain: 30 ml x 24 hours 10 Fr LUQ drain: 0 ml x 24 hours 19 Fr midline abd drain: 2 ml x 24 hours 19 Fr R abd JP drain: 0 ml x 24 hours 12 Fr LLQ drain: 20 ml x 24 hours 10 Fr RUQ drain: 0 ml x 24 hours 12 Fr RLQ drain: 25 ml x 24 hours I/O's: +17.0 L since admit  NUTRITION - FOCUSED PHYSICAL EXAM:  Flowsheet Row Most Recent Value  Orbital Region No depletion  Upper Arm Region No depletion  Thoracic and Lumbar Region  No depletion  Buccal Region No depletion  Temple Region No depletion  Clavicle Bone Region No depletion  Clavicle and Acromion Bone Region No depletion  Scapular Bone Region No depletion  Dorsal Hand No depletion  Patellar Region Unable to assess  Anterior Thigh Region Unable to assess  Posterior Calf Region Unable to assess  Edema (RD Assessment) Mild  Hair Reviewed  Eyes Reviewed  Mouth Reviewed  Skin Reviewed  Nails Reviewed       Diet Order:   Diet Order             Diet regular Room service appropriate?  Yes; Fluid consistency: Thin  Diet effective now                   EDUCATION NEEDS:   Education needs have been addressed  Skin:  Skin Assessment: Reviewed RN Assessment (abd incision)  Last BM:  12/25/21 small type 6  Height:   Ht Readings from Last 1 Encounters:  12/13/21 '5\' 5"'$  (1.651 m)    Weight:   Wt Readings from Last 1 Encounters:  12/18/21 (!) 143.3 kg    Ideal Body Weight:  56.8 kg  BMI:  Body mass index is 52.57 kg/m.  Estimated Nutritional Needs:   Kcal:  2200-2400  Protein:  115-135 grams  Fluid:  >2.0 L    Gustavus Bryant, MS, RD, LDN Inpatient Clinical Dietitian Please see [facility] for contact information.

## 2021-12-26 NOTE — Progress Notes (Signed)
GRISEL BLUMENSTOCK is a 51 y.o. female patient   Treatment Plan: Date: 12/26/2021  Diagnosis 296.80 (Unspecified bipolar or related disorder) [n/a]  Symptoms Depressed or irritable mood. (Status: maintained) -- No Description Entered  Diminished interest in or enjoyment of activities. (Status: maintained) -- No Description Entered  Lack of energy. (Status: maintained) -- No Description Entered  Low self-esteem. (Status: maintained) -- No Description Entered  Medication Status compliance  Safety none  If Suicidal or Homicidal State Action Taken: unspecified  Current Risk: low Medications Adderall (Dosage: '20mg'$  4X/day)  Klonapin (Dosage: '1mg'$  3X/day)  Lamictal (Dosage: '200mg'$  2x/day)  Nuedexta (Dosage: '20mg'$ )  Vyralar (Dosage: '6mg'$ )  Xanax (Dosage: '1mg'$  3X/day)  Objectives Related Problem: Develop healthy cognitive patterns and beliefs about self and the world that lead to alleviation and help prevent the relapse of mood episodes. Description: State no longer having thoughts of self-harm. Target Date: 2022-01-07 Frequency: Daily Modality: individual Progress: 80%  Related Problem: Develop healthy cognitive patterns and beliefs about self and the world that lead to alleviation and help prevent the relapse of mood episodes. Description: Verbalize any history of past and present suicidal thoughts and actions. Target Date: 2022-01-07 Frequency: Daily Modality: individual Progress: 80%  Related Problem: Develop healthy cognitive patterns and beliefs about self and the world that lead to alleviation and help prevent the relapse of mood episodes. Description: Take prescribed medications as directed. Target Date: 2022-01-07 Frequency: Daily Modality: individual Progress: 90%  Related Problem: Develop healthy cognitive patterns and beliefs about self and the world that lead to alleviation and help prevent the relapse of  mood episodes. Description: Discuss and resolve troubling personal and interpersonal issues. Target Date: 2022-01-07 Frequency: Daily Modality: individual Progress: 80%  Client Response full compliance  Service Location Location, [name] [place], [place] [id number] -virtual Service Code cpt [phone]  Emotion regulation skills  Related past to present  Rationally challenge thoughts or beliefs/cognitive restructuring  Identify/label emotions  Validate/empathize  Self care activities  Lifestyle change (exercise, nutrition)  Self-monitoring  Facilitate problem solving  Session notes:  f31.9 Bipolar Unspecified.  Goals: Mood stabilization, improved marital relationship, improve self esteem and elimination of self harm thoughts/tendencies. Also, patient struggles with leaving home comfortably and seeks to improve this ability. Target date for these goals is January, 2023. In addition, she needs to improve self care behaviors due to her weight and various medical conditions. Target date for this goal is Feb., 2022. Date revised to December, 2023 to allow time for behavioral implementation. Reports periodic outbursts of rage/anger. Seeks to minimize. Target date is December, 2023. Patient has realized improvement in all areas. Requests additional treatment to facilitate maintenance of progress. Goal date February 05, 2023. Patient's husband died unexpectedly and she is suffering complicated grief. She needs additional counseling to address associated issues and manage feelings of loss, despair and anxiety. Goal date 8-24.  Meds: Vyralar ('6mg'$ ),Nuedexta (40+'20mg'$ /day), Adderall ('20mg'$  4X/day), Xanax ('1mg'$ , 3X/day), and Klonopin ('1mg'$  3X/day).  Patient has requested a video session. She is at home and I am in my home office.  Ermalinda had emergency surgery for GI problems. She has been in the hospital for 26 days. Was in ICU and now in a private room. She had 2 perforations (small intestine and sigmoid colon)  and they do not know how this  happened. Thinking is that it may have been the medication for pain. She is back on her psychiatric medication, which has helped stabilize her mood. She is now facing the possibility of having a colostomy and will find out in the next few days. She expresses very little about her thoughts/feelings. She trying to deal with the death of [name], which complicates the processing of what she is currently experiencing with her health. She is mostly passing time by watching TV. Says she has things to read at home. Suggested she ask family to bring some of those materials to her.                                            [name]                                                                                                                                                                                        [name], PhD  Time:2:10p-3:00p 50 minutes.                           n

## 2021-12-26 NOTE — Progress Notes (Signed)
PROGRESS NOTE    [name]  XFG:[id number] DOB: 1970-07-30 DOA: 12/11/2021 PCP: [place], Betty G, MD    Brief Narrative:   [name] is a 51 y.o. female with past medical history significant for OSA/OHS on CPAP, bipolar 1 disorder, Hx chronic systolic congestive heart failure/cardiomyopathy who presented to [facility] ED on 11/13 with abdominal pain.  In the ED, CT abdomen/pelvis notable for free air and fluid.  General surgery was consulted and patient was taken emergently to the operating room with finding of a well-formed abscess cavity in the lower mid abdomen near the sigmoid colon with purulent peritonitis and fibrinous exudate suspected secondary to perforated sigmoid diverticulitis.  Patient had laparoscopic peritoneal lavage with drains placed and patient was transferred to the intensive care unit postoperatively as she remained intubated but was successfully extubated the following day.  Given continued purulence from drain output, repeat CT abdomen/pelvis was performed on 11/20 with findings of multiple fluid and gas collections; IR was consulted on 11/21 for repeat drain placement.  Assessment & Plan:   Abdominal abscess/peritonitis 2/2 presumed perforated sigmoid diverticulitis Patient presenting to ED with abdominal pain, CT abdomen/pelvis notable for free air and fluid.  General surgery was consulted and patient was taken emergently to the OR with findings of a well-formed abscess cavity lower mid abdomen near the sigmoid colon with purulent peritonitis and fibrinous exudate suspected secondary to a perforated sigmoid diverticulitis; and underwent laparoscopic peritoneal lavage with drain placement.  Repeat CT abdomen/pelvis 7/20 with multiple air-fluid levels and general surgery consulted IR for further drain placement under IR guidance.  Repeat CT abdomen/pelvis 11/26 with significant decompression of the pelvic collections, persistent 4 cm gas and fluid collection  posterior uterus which was previously 6.2 cm. -- General surgery following, appreciate assistance -- WBC 5.6>>18.8>>12.5>13.3>12.9>12.3>11.6>11.8>11.6 -- Blood cultures 11/13: + Bacteroides thetaiotaomicron -- Operative culture 11/13: E. coli, abundant Bacteroides thetaiotaomicron -- Culture from IR drain placement 11/21: Abundant Bacteroides, Clostridium perfringens, Candida albicans -- Zosyn 3.375 g IV every 8 hours -- Fluconazole '400mg'$  IV q24h -- Continue to monitor drain output closely (currently 7 drains; 3 placed by CCS and 4 placed by IR); of which lower quadrant drains continue with significant feculent output -- Started back on regular diet on 11/28 by general surgery, continue TPN -- CBC daily  **Highly concerned that now restarting diet will have reaccumulation of pelvic abscess with increased drain output as this been prior when she started on a regular diet as there is still high concern of gastric leak within the colon.  It was previously suggested that she may need diverting colostomy by general surgery this weekend.  OSA/OHS Follows with pulmonology outpatient, Dr. Elsworth Soho. -- Continue nocturnal CPAP  Hx chronic systolic/diastolic congestive heart failure/cardiomyopathy Follows with cardiology outpatient, Dr. Percival Spanish.  Most recent echocardiogram February 2022 with now improved LVEF of 60 to 96%, grade 1 diastolic dysfunction --Continue to hold home Lasix  Hx seizures, bipolar disorder, depression --Continue alprazolam '1mg'$  PO TID, Lyrica '300mg'$  PO BID, Vraylar '6mg'$  PO qHS  Acute renal failure Etiology likely secondary to infection versus dehydration from abdominal abscess/peritonitis as above.  Home Lasix on hold, supported with IV fluids with now normalization of creatinine, 0.79. --Continue intermittent monitoring of renal function  GERD: --Protonix 40 mg p.o. twice daily  Morbid obesity Body mass index is 52.57 kg/m.  Discussed with patient needs for aggressive  lifestyle changes/weight loss as this complicates all facets of care.  Outpatient follow-up with PCP.  May benefit  from bariatric evaluation outpatient.   DVT prophylaxis: heparin injection 5,000 Units Start: 12/12/21 0600    Code Status: Full Code Family Communication: Updated sister-in-law Loretta at bedside this morning Disposition Plan:  Level of care: Med-Surg Status is: Inpatient Remains inpatient appropriate because: IV antibiotics    Consultants:  General surgery Interventional radiology  Procedures:  Laparoscopic peritoneal lavage with drain placement x 3, Dr. Thermon Leyland; 11/13 CT guided drain placement x 4; IR, 11/21, Dr. Pascal Lux PICC line 11/25  Antimicrobials:  Zosyn 11/13>> Micafungin 11/22 - 11/24 Fluconazole 11/25>> Metronidazole 11/13 - 11/13   Subjective: Patient seen and examined bedside, resting comfortably.  Lying in bed.  Continues with feculent drainage from lower quadrant drains; although improved.  Remains on IV antibiotics.  Continues on TPN but restarted on a regular diet by general surgery yesterday.  Daughter-in-law present at bedside.  Per discussion with general surgery this weekend likely anticipate diverting colostomy this week; but now general surgery advance diet and holding off on this.  I do have high concern that she will have reaccumulation of pelvic abscess and the only reason why her drain output is low as she was not intaking much over the last several days.  Patient denies headache, no fever/chills/night sweats, no nausea/vomiting, no diarrhea, no chest pain, no palpitations, no shortness of breath, no abdominal pain, no cough/congestion.  No acute events overnight per nursing staff.  Objective: Vitals:   12/25/21 1654 12/25/21 2036 12/26/21 0515 12/26/21 0835  BP: (!) 160/88 133/71 (!) 148/72 (!) 147/78  Pulse: (!) 107 87 (!) 109 100  Resp: '18 18 17 18  '$ Temp: (!) 97.5 F (36.4 C) 97.7 F (36.5 C) 97.6 F (36.4 C) 98 F (36.7 C)   TempSrc: Oral Oral Oral Oral  SpO2: 95% 94% 93% 94%  Weight:      Height:        Intake/Output Summary (Last 24 hours) at 12/26/2021 1042 Last data filed at 12/26/2021 0900 Gross per 24 hour  Intake 2281.11 ml  Output 77 ml  Net 2204.11 ml   Filed Weights   12/11/21 2114 12/18/21 0432  Weight: (!) 138.6 kg (!) 143.3 kg    Examination:  Physical Exam: GEN: NAD, alert and oriented x 3, obese; appears older than stated age, chronically ill in appearance HEENT: NCAT, PERRL, EOMI, sclera clear, MMM PULM: CTAB w/o wheezes/crackles, normal respiratory effort, on room air CV: RRR w/o M/G/R GI: abd soft, mild lower abdominal pain on palpation, NABS, no R/G/M; abdominal drains x 7 noted with continued feculent output MSK: no peripheral edema, moves all extremities independently NEURO: CN II-XII intact, no focal deficits, sensation to light touch intact PSYCH: normal mood/affect Integumentary: Abdominal drains/surgical incision sites noted, otherwise no other concerning rashes/lesions/wounds noted on exposed skin surfaces.      Data Reviewed: I have personally reviewed following labs and imaging studies  CBC: Recent Labs  Lab 12/21/21 0752 12/22/21 0320 12/23/21 0139 12/24/21 0412 12/25/21 0411  WBC 11.6* 11.8* 12.1* 11.6* 10.4  HGB 12.1 11.0* 11.0* 10.8* 10.3*  HCT 36.6 34.6* 33.9* 33.1* 31.9*  MCV 92.7 93.8 91.6 93.0 93.0  PLT 550* 560* 705* 657* 300*   Basic Metabolic Panel: Recent Labs  Lab 12/20/21 2333 12/22/21 0320 12/23/21 0139 12/24/21 0412 12/25/21 0411 12/26/21 0301  NA 139 141 142 141 139 138  K 4.2 4.0 4.7 3.6 3.8 4.3  CL 101 100 101 100 102 101  CO2 '27 26 28 30 28 27  '$ GLUCOSE 137* 151*  123* 194* 202* 194*  BUN '8 8 8 6 7 8  '$ CREATININE 0.77 0.72 0.60 0.65 0.58 0.51  CALCIUM 8.6* 8.9 8.9 8.7* 8.4* 8.4*  MG 2.0 1.8  --  1.9 1.9 1.9  PHOS  --   --   --  3.5 3.4 3.2   GFR: Estimated Creatinine Clearance: 118.8 mL/min (by C-G formula based on SCr of  0.51 mg/dL). Liver Function Tests: Recent Labs  Lab 12/23/21 0923 12/24/21 0412 12/25/21 0411  AST '25 20 19  '$ ALT '14 11 12  '$ ALKPHOS 89 87 74  BILITOT 0.3 <0.1* 0.4  PROT 5.7* 5.4* 5.3*  ALBUMIN 1.7* 1.5* <1.5*   No results for input(s): "LIPASE", "AMYLASE" in the last 168 hours. No results for input(s): "AMMONIA" in the last 168 hours. Coagulation Profile: No results for input(s): "INR", "PROTIME" in the last 168 hours. Cardiac Enzymes: No results for input(s): "CKTOTAL", "CKMB", "CKMBINDEX", "TROPONINI" in the last 168 hours. BNP (last 3 results) No results for input(s): "PROBNP" in the last 8760 hours. HbA1C: No results for input(s): "HGBA1C" in the last 72 hours. CBG: Recent Labs  Lab 12/25/21 0504 12/25/21 1132 12/25/21 1652 12/25/21 2351 12/26/21 0606  GLUCAP 189* 190* 144* 165* 182*   Lipid Profile: Recent Labs    12/24/21 0412  TRIG 146   Thyroid Function Tests: No results for input(s): "TSH", "T4TOTAL", "FREET4", "T3FREE", "THYROIDAB" in the last 72 hours. Anemia Panel: No results for input(s): "VITAMINB12", "FOLATE", "FERRITIN", "TIBC", "IRON", "RETICCTPCT" in the last 72 hours. Sepsis Labs: No results for input(s): "PROCALCITON", "LATICACIDVEN" in the last 168 hours.  Recent Results (from the past 240 hour(s))  Aerobic/Anaerobic Culture w Gram Stain (surgical/deep wound)     Status: None   Collection Time: 12/19/21  4:58 PM   Specimen: Wound; Abscess  Result Value Ref Range Status   Specimen Description WOUND  Final   Special Requests POST CT GUIDED DRAIN PLACEMENT  Final   Gram Stain   Final    FEW WBC PRESENT, PREDOMINANTLY PMN MODERATE GRAM POSITIVE RODS FEW GRAM NEGATIVE RODS RARE BUDDING YEAST SEEN    Culture   Final    ABUNDANT CANDIDA ALBICANS ABUNDANT BACTEROIDES SPECIES NOT FRAGILIS BETA LACTAMASE POSITIVE FEW CLOSTRIDIUM PERFRINGENS Standardized susceptibility testing for this organism is not available. Performed at [facility], [place] [address] [place], [place] [address]    Report Status 12/22/2021 FINAL  Final         Radiology Studies: CT ABDOMEN PELVIS W CONTRAST  Result Date: 12/24/2021 CLINICAL DATA:  Abdominal pain postop EXAM: CT ABDOMEN AND PELVIS WITH CONTRAST TECHNIQUE: Multidetector CT imaging of the abdomen and pelvis was performed using the standard protocol following bolus administration of intravenous contrast. RADIATION DOSE REDUCTION: This exam was performed according to the departmental dose-optimization program which includes automated exposure control, adjustment of the mA and/or kV according to patient size and/or use of iterative reconstruction technique. CONTRAST:  185m OMNIPAQUE IOHEXOL 350 MG/ML SOLN COMPARISON:  12/18/2021 FINDINGS: Lower chest: New small right pleural effusion. Dependent atelectasis/consolidation of the lung bases right greater than left. Hepatobiliary: Gallbladder is physiologically distended with at least 1 5 mm calcified stone in its dependent aspect. No focal liver lesion or biliary ductal dilatation. Pancreas: Unremarkable. No pancreatic ductal dilatation or surrounding inflammatory changes. Spleen: Normal in size without focal abnormality. Adrenals/Urinary Tract: Adrenal glands are unremarkable. Kidneys are normal, without renal calculi, focal lesion, or hydronephrosis. Bladder is unremarkable. Stomach/Bowel: Stomach is physiologically distended, unremarkable. Small  bowel is decompressed with good distal passage of oral contrast material. Appendix not identified. The colon is nondilated, unremarkable. Vascular/Lymphatic: Mild scattered aortoiliac calcified atheromatous plaque without aneurysm. No abdominal or pelvic adenopathy. Reproductive: Uterus and bilateral adnexa are unremarkable. Other: Stable left upper abdominal surgical drain terminating inferior to the left lobe of the liver. Left parasagittal surgical drain extends to the left lower quadrant as before.  Right mid abdominal surgical drain extends to the anterior pelvis as before. Interval pigtail drain placement in right infrahepatic fluid collection with significant decompression, residual gas and fluid pocket 4.8 x 1.5 cm. Interval pigtail drain placement into left lower quadrant collection with significant decompression, 1.5 cm residual gas and fluid pocket. Interval pigtail drain placement into left pelvic fluid collection, with significant decompression, 1.6 cm gas and fluid pocket medial to the drain. Interval pigtail drain placement into the right pelvic gas and fluid collection, virtually completely evacuated. Persistent 4 cm gas and fluid collection posterior to the uterus, previously 6.2 cm. There is linear gas in the left lateral abdominal wall musculature, new since previous. Musculoskeletal: Bilateral hip DJD. Bilateral sacroiliitis. Spondylitic changes throughout the lower thoracic and lumbar spine. No acute findings. IMPRESSION: 1. Interval pigtail drain placements into right infrahepatic, left lower quadrant, left pelvic, and right pelvic collections with significant decompression. 2. Persistent 4 cm gas and fluid collection posterior to the uterus, previously 6.2 cm. 3. New small right pleural effusion with bibasilar atelectasis/consolidation. 4. Cholelithiasis. Aortic Atherosclerosis (ICD10-I70.0). Electronically Signed   By: Lucrezia Europe M.D.   On: 12/24/2021 20:09        Scheduled Meds:  acetaminophen  1,000 mg Oral Q6H   ALPRAZolam  1 mg Oral TID   amphetamine-dextroamphetamine  20 mg Oral TID   cariprazine  6 mg Oral QPM   Chlorhexidine Gluconate Cloth  6 each Topical Daily   Dextromethorphan-quiNIDine  1 capsule Oral Q12H   feeding supplement  237 mL Oral BID BM   heparin injection (subcutaneous)  5,000 Units Subcutaneous Q8H   insulin aspart  0-20 Units Subcutaneous Q6H   lidocaine  20 mL Intradermal Once   methocarbamol  1,000 mg Oral Q8H   mouth rinse  15 mL Mouth Rinse 4  times per day   pantoprazole  40 mg Oral BID   pregabalin  300 mg Oral BID   psyllium  1 packet Oral Daily   sodium chloride flush  5 mL Intracatheter Q8H   Continuous Infusions:  fluconazole (DIFLUCAN) IV 400 mg (12/26/21 0858)   piperacillin-tazobactam (ZOSYN)  IV 3.375 g (12/26/21 0537)   TPN ADULT (ION) 100 mL/hr at 12/26/21 0629   TPN ADULT (ION)       LOS: 15 days    Time spent: 50 minutes spent on chart review, discussion with nursing staff, consultants, updating family and interview/physical exam; more than 50% of that time was spent in counseling and/or coordination of care.    Tara Wich J [place] DO [facility] Available via Epic secure chat 7am-7pm After these hours, please refer to coverage provider listed on [email or web address] 12/26/2021, 10:42 AM

## 2021-12-26 NOTE — Progress Notes (Signed)
PHARMACY - TOTAL PARENTERAL NUTRITION CONSULT NOTE   Indication:  abdominal abscess  Patient Measurements: Height: '5\' 5"'$  (165.1 cm) Weight: (!) 143.3 kg (315 lb 14.7 oz) IBW/kg (Calculated) : 57  Adjusted Body Weight: 91 kg - used for nutrition calculations Body mass index is 52.57 kg/m. Usual Weight: ~141 kg  Assessment:  51 yo F with PMH of HF, HTN, HLD, seizures, T2DM, anxiety, bipolar, depression, pulmonary hypertension, and obesity presented to the ED on 11/13 with abdominal pain and found to have pneumoperitoneum on CT. Pt went to OR on 11/13 and found to have perforated sigmoid diverticulitis w/ peritonitis and abscess and underwent laprascopic peritoneal lavage w/ drain placement and was initiated on Zosyn for E. coli and  Bacteroides thetaiotaomicron from the wound culture and Bacteroides thetaioatomicron bacteremia. On 11/17, pt was gradually restarted on CLD, advanced to soft diet on 11/18. Drain output became purulent on 11/20 and CT abdomen showed multiple large intraabdominal fluid collections and pt was made NPO on 11/21. Pt underwent CT-guided placement of 4 abdominal drains with feculent, foul smelling drainage output in IR on 11/21, concerning for abdominal abscess. Micafungin was added to Zosyn for coverage of yeast in addition to Bacteroides and Clostridium perfringens growing from abdominal drainage culture on 11/21. Pharmacy has been consulted to initiate and manage TPN to allow for perforated sigmoid to heal over.  Surgery discussing possible diverting colostomy sometime this week.  Glucose / Insulin: cBG <200, A1c 5.9% (10/23), rSSI requiring 11 units/24h Electrolytes: WNL, coCa 10.7 Renal: SCr <1, stable Hepatic: albumin <1.5, LFTs/AlkPhos/Tbili WNL, TG 146 Intake / Output; MIVF: abdominal drain output (total):53m; LBM: 11/27 (4x/24h); UOP not documented GI Imaging: 11/13 CT abd: sigmoid colitis and colonic perforation, likely ileus in mid jejunum 11/20 CT abd:  multifocal intra-abdominal abscesses in the setting of perforated viscus, small volume perihepatic ascites, cholelithiasis 11/26 CT abd: - Interval pigtail drain placements into right infrahepatic, left lower quadrant, left pelvic, and right pelvic collections with significant decompression. - Persistent 4 cm gas and fluid collection posterior to the uterus, previously 6.2 cm. - New small right pleural effusion with bibasilar atelectasis/consolidation. - Cholelithiasis. GI Surgeries / Procedures:  11/13: laparoscopic peritoneal lavage w/ drain placement 11/21: CT-guided placement of 4 abdominal drains by IR  Central access: PICC placed 11/25 TPN start date: 12/23/21  Nutritional Goals: Goal TPN rate is 100 mL/hr (provides 130 g of protein and 2313 kcals per day)  RD Assessment: Estimated Needs Total Energy Estimated Needs: 2200-2400 Total Protein Estimated Needs: 115-135 grams Total Fluid Estimated Needs: >2.0 L  Current Nutrition:  TPN Regular diet 11/27 11/28 Supplements added  Plan:  Per Dr. LBobbye Morton Continue TPN at goal rate of 1026mhr at 1800 to provide 130g protein and 2313 kcal/day, meeting ~100% of daily nutritional needs.  Note: Lipids have NOT been added to TPN in setting of active fungal infection.  Electrolytes in TPN: Na 5067mL, K 14m73m, Ca 5mEq51m Mg 8mEq/20mand Phos 15mmol39mCl:Ac 1:1 Add standard MVI and trace elements to TPN Continue Resistant q4h SSI and adjust as needed  Monitor TPN labs on Mon/Thurs F/u ability to wean TPN - F/u calorie count  Cathy PAlanda SlimD, FCCM [facility] Pharmacist Please see AMION for all Pharmacists' Contact Phone Numbers 12/26/2021, 8:06 AM    Please refer to AMION [facility] phone number

## 2021-12-26 NOTE — Progress Notes (Signed)
Physical Therapy Treatment Patient Details Name: [name] MRN: [id number] DOB: 02/14/1970 Today's Date: 12/26/2021   History of Present Illness 51 y.o. female [facility] to [facility] hospital on 12/11/2021 with abdominal pain, found to have perforated sigmoid diverticulitis. Pt underwent laparoscopy with washout and drain placement on 11/13. Pt extubated 11/14. PICC placed 56/38/75 Right Basilic. PMH includes OSA, systolic HF, anxiety, OA, asthma, bipolar disorder, depression.    PT Comments    Good progress today. Ambulated outside of room into hallway (38f) with min guard assist, using RW for stability. Tolerated LE strengthening exercises. Encouraged more time OOB, walking with staff as able. Pt verbalizes understanding. Patient will continue to benefit from skilled physical therapy services to further improve independence with functional mobility.    Recommendations for follow up therapy are one component of a multi-disciplinary discharge planning process, led by the attending physician.  Recommendations may be updated based on patient status, additional functional criteria and insurance authorization.  Follow Up Recommendations  Home health PT     Assistance Recommended at Discharge Intermittent Supervision/Assistance  Patient can return home with the following A little help with walking and/or transfers;A little help with bathing/dressing/bathroom;Assistance with cooking/housework;Assist for transportation;Help with stairs or ramp for entrance   Equipment Recommendations  Rolling walker (2 wheels)    Recommendations for Other Services       Precautions / Restrictions Precautions Precautions: Fall Precaution Comments: 7 drains - new PICC line being placed 11/25 Restrictions Weight Bearing Restrictions: No     Mobility  Bed Mobility               General bed mobility comments: Sitting EOB when PT entered room    Transfers Overall transfer level: Needs  assistance Equipment used: Rolling walker (2 wheels) Transfers: Sit to/from Stand Sit to Stand: Min assist           General transfer comment: Min assist for boost from bed with cues for hand placement on rail for push-off.    Ambulation/Gait Ambulation/Gait assistance: Min guard Gait Distance (Feet): 60 Feet Assistive device: Rolling walker (2 wheels) Gait Pattern/deviations: Step-through pattern, Decreased stride length, Wide base of support Gait velocity: decreased Gait velocity interpretation: <1.31 ft/sec, indicative of household ambulator   General Gait Details: Educated on safe AD use with RW. Required encouragement to continue walking outside of room; reluctantly agreeable. Tolerated approx 30 feet before wanting to return, primarily due to SOB. Improved upon sitting. Reports LEs feel a little wobbly but no overt buckling noted. Cues for walker placement closer to proximity.   Stairs             Wheelchair Mobility    Modified Rankin (Stroke Patients Only)       Balance Overall balance assessment: Needs assistance Sitting-balance support: No upper extremity supported, Feet supported Sitting balance-Leahy Scale: Good     Standing balance support: No upper extremity supported, During functional activity Standing balance-Leahy Scale: Fair                              Cognition Arousal/Alertness: Awake/alert Behavior During Therapy: WFL for tasks assessed/performed Overall Cognitive Status: Impaired/Different from baseline Area of Impairment: Problem solving                             Problem Solving: Slow processing, Requires verbal cues          Exercises  General Exercises - Lower Extremity Ankle Circles/Pumps: AAROM, 5 reps Quad Sets: 10 reps, Strengthening, Both, Seated Gluteal Sets: 10 reps, Strengthening, Both, Seated Long Arc Quad: Strengthening, Both, 10 reps, Seated Hip ABduction/ADduction: Strengthening, Both, 5  reps, Seated    General Comments        Pertinent Vitals/Pain Pain Assessment Pain Assessment: No/denies pain Pain Intervention(s): Monitored during session, Repositioned    Home Living                          Prior Function            PT Goals (current goals can now be found in the care plan section) Acute Rehab PT Goals Patient Stated Goal: be independent again PT Goal Formulation: With patient Time For Goal Achievement: 12/27/21 Potential to Achieve Goals: Good Progress towards PT goals: Progressing toward goals    Frequency    Min 3X/week      PT Plan Current plan remains appropriate    Co-evaluation              AM-PAC PT "6 Clicks" Mobility   Outcome Measure  Help needed turning from your back to your side while in a flat bed without using bedrails?: A Little Help needed moving from lying on your back to sitting on the side of a flat bed without using bedrails?: A Little Help needed moving to and from a bed to a chair (including a wheelchair)?: A Little Help needed standing up from a chair using your arms (e.g., wheelchair or bedside chair)?: A Little Help needed to walk in hospital room?: A Little Help needed climbing 3-5 steps with a railing? : A Lot 6 Click Score: 17    End of Session Equipment Utilized During Treatment: Gait belt Activity Tolerance: Patient tolerated treatment well Patient left: with call bell/phone within reach;in chair (RD coming to room) Nurse Communication: Mobility status PT Visit Diagnosis: Other abnormalities of gait and mobility (R26.89);Muscle weakness (generalized) (M62.81);Pain     Time: 2979-8921 PT Time Calculation (min) (ACUTE ONLY): 18 min  Charges:  $Gait Training: 8-22 mins                     Candie Mile, PT, DPT Physical Therapist Acute Rehabilitation Services [place] 12/26/2021, 1:04 PM

## 2021-12-26 NOTE — Progress Notes (Signed)
PT Cancellation Note  Patient Details Name: KEIYANA STEHR MRN: [id number] DOB: [birth year]   Cancelled Treatment:    Reason Eval/Treat Not Completed: Patient declined, no reason specified  Spent time discussing importance of mobility to prevent secondary complications that can arise due to prolonged periods in the bed during hospitalization. States she may be more agreeable this afternoon. Will follow-up as time allows.   Ellouise Newer 12/26/2021, 11:57 AM

## 2021-12-26 NOTE — Progress Notes (Signed)
General Surgery Follow Up Note  Subjective:    Overnight Issues:   Objective:  Vital signs for last 24 hours: Temp:  [97.5 F (36.4 C)-98.3 F (36.8 C)] 97.6 F (36.4 C) (11/28 0515) Pulse Rate:  [87-109] 109 (11/28 0515) Resp:  [17-18] 17 (11/28 0515) BP: (133-160)/(71-88) 148/72 (11/28 0515) SpO2:  [90 %-95 %] 93 % (11/28 0515)  Hemodynamic parameters for last 24 hours:    Intake/Output from previous day: 11/27 0701 - 11/28 0700 In: 2631.1 [P.O.:1120; I.V.:1284.9; IV Piggyback:186.2] Out: 77 [Drains:77]  Intake/Output this shift: No intake/output data recorded.  Vent settings for last 24 hours:    Physical Exam:  Gen: comfortable, no distress Neuro: non-focal exam HEENT: PERRL Neck: supple CV: RRR Pulm: unlabored breathing Abd: soft, NT, drains in place, one feculent GU: clear yellow urine Extr: wwp, no edema   Results for orders placed or performed during the hospital encounter of 12/11/21 (from the past 24 hour(s))  Glucose, capillary     Status: Abnormal   Collection Time: 12/25/21 11:32 AM  Result Value Ref Range   Glucose-Capillary 190 (H) 70 - 99 mg/dL  Glucose, capillary     Status: Abnormal   Collection Time: 12/25/21  4:52 PM  Result Value Ref Range   Glucose-Capillary 144 (H) 70 - 99 mg/dL  Glucose, capillary     Status: Abnormal   Collection Time: 12/25/21 11:51 PM  Result Value Ref Range   Glucose-Capillary 165 (H) 70 - 99 mg/dL  Basic metabolic panel     Status: Abnormal   Collection Time: 12/26/21  3:01 AM  Result Value Ref Range   Sodium 138 135 - 145 mmol/L   Potassium 4.3 3.5 - 5.1 mmol/L   Chloride 101 98 - 111 mmol/L   CO2 27 22 - 32 mmol/L   Glucose, Bld 194 (H) 70 - 99 mg/dL   BUN 8 6 - 20 mg/dL   Creatinine, Ser 0.51 0.44 - 1.00 mg/dL   Calcium 8.4 (L) 8.9 - 10.3 mg/dL   GFR, Estimated >60 >60 mL/min   Anion gap 10 5 - 15  Magnesium     Status: None   Collection Time: 12/26/21  3:01 AM  Result Value Ref Range    Magnesium 1.9 1.7 - 2.4 mg/dL  Phosphorus     Status: None   Collection Time: 12/26/21  3:01 AM  Result Value Ref Range   Phosphorus 3.2 2.5 - 4.6 mg/dL  Glucose, capillary     Status: Abnormal   Collection Time: 12/26/21  6:06 AM  Result Value Ref Range   Glucose-Capillary 182 (H) 70 - 99 mg/dL    Assessment & Plan:   Present on Admission:  Anxiety state  Bipolar I disorder (HCC)  Chronic combined systolic and diastolic CHF (congestive heart failure) (HCC)  Essential hypertension, benign  Hyperlipidemia  Morbid obesity ([place])  Type 2 diabetes mellitus with other specified complication ([place])    LOS: 15 days   Additional comments:I reviewed the patient's new clinical lab test results.   and I reviewed the patients new imaging test results.    POD 15 s/p diagnostic lap washout, evacuation of abscess, and JP drain placement x2, Dr. Thermon Leyland 11/13 -CT 11/20 showed multiple loculated air and fluid collections throughout the abdomen and pelvis. CT 11/26 reviewed, now with seven drains. Continue antibiotics for now. This was likely perforated diverticultitis from beginning and may just actually need colectomy and/or colostomy, but she has reached plateau it looks like and  may be able to be managed just with drains, at least one of which is clearly feculent but low output. WBC normal and abdominal exam stable. Tolerating diet. -mobilize, PT   FEN - soft VTE - sq heparin ID - currently zosyn   GNR bacteremia  HTN Morbid obesity OSA CHF Bipolar disorder HLD Seizures  Jesusita Oka, MD Trauma & General Surgery Please use [email or web address] to contact on call provider  12/26/2021  *Care during the described time interval was provided by me. I have reviewed this patient's available data, including medical history, events of note, physical examination and test results as part of my evaluation.

## 2021-12-26 NOTE — Progress Notes (Signed)
Yellow MEWS, HR 114, pt. Aa+ox4, no other symptoms reported by patient, no apparent distress noted, no new orders at present, will cont. To monitor  [name]

## 2021-12-26 NOTE — Plan of Care (Signed)

## 2021-12-27 DIAGNOSIS — F319 Bipolar disorder, unspecified: Secondary | ICD-10-CM | POA: Diagnosis not present

## 2021-12-27 DIAGNOSIS — F411 Generalized anxiety disorder: Secondary | ICD-10-CM | POA: Diagnosis not present

## 2021-12-27 DIAGNOSIS — I5042 Chronic combined systolic (congestive) and diastolic (congestive) heart failure: Secondary | ICD-10-CM | POA: Diagnosis not present

## 2021-12-27 DIAGNOSIS — K668 Other specified disorders of peritoneum: Secondary | ICD-10-CM | POA: Diagnosis not present

## 2021-12-27 LAB — CBC
HCT: 33.9 % — ABNORMAL LOW (ref 36.0–46.0)
Hemoglobin: 10.7 g/dL — ABNORMAL LOW (ref 12.0–15.0)
MCH: 29.8 pg (ref 26.0–34.0)
MCHC: 31.6 g/dL (ref 30.0–36.0)
MCV: 94.4 fL (ref 80.0–100.0)
Platelets: 478 10*3/uL — ABNORMAL HIGH (ref 150–400)
RBC: 3.59 MIL/uL — ABNORMAL LOW (ref 3.87–5.11)
RDW: 15.2 % (ref 11.5–15.5)
WBC: 14 10*3/uL — ABNORMAL HIGH (ref 4.0–10.5)
nRBC: 0 % (ref 0.0–0.2)

## 2021-12-27 LAB — GLUCOSE, CAPILLARY
Glucose-Capillary: 202 mg/dL — ABNORMAL HIGH (ref 70–99)
Glucose-Capillary: 213 mg/dL — ABNORMAL HIGH (ref 70–99)
Glucose-Capillary: 225 mg/dL — ABNORMAL HIGH (ref 70–99)
Glucose-Capillary: 247 mg/dL — ABNORMAL HIGH (ref 70–99)

## 2021-12-27 LAB — BASIC METABOLIC PANEL
Anion gap: 8 (ref 5–15)
BUN: 9 mg/dL (ref 6–20)
CO2: 28 mmol/L (ref 22–32)
Calcium: 8.5 mg/dL — ABNORMAL LOW (ref 8.9–10.3)
Chloride: 99 mmol/L (ref 98–111)
Creatinine, Ser: 0.68 mg/dL (ref 0.44–1.00)
GFR, Estimated: >60 mL/min (ref >60)
Glucose, Bld: 219 mg/dL — ABNORMAL HIGH (ref 70–99)
Potassium: 4.6 mmol/L (ref 3.5–5.1)
Sodium: 135 mmol/L (ref 135–145)

## 2021-12-27 MED ORDER — ENSURE ENLIVE PO LIQD
237.0000 mL | Freq: Three times a day (TID) | ORAL | Status: DC
  Administered 2021-12-27 – 2021-12-31 (×11): 237 mL via ORAL

## 2021-12-27 MED ORDER — TRAVASOL 10 % IV SOLN
INTRAVENOUS | Status: AC
  Filled 2021-12-27: qty 1296

## 2021-12-27 NOTE — Progress Notes (Signed)
Mobility Specialist Progress Note:   12/27/21 1552  Mobility  Activity Refused mobility   Pt refused mobility. No reason given when asked. Will f/u as able.    Andrey Campanile Mobility Specialist Please contact via SecureChat or  Rehab office at [phone]

## 2021-12-27 NOTE — Progress Notes (Signed)
PHARMACY - TOTAL PARENTERAL NUTRITION CONSULT NOTE   Indication:  abdominal abscess  Patient Measurements: Height: '5\' 5"'$  (165.1 cm) Weight: (!) 143.3 kg (315 lb 14.7 oz) IBW/kg (Calculated) : 57  Adjusted Body Weight: 91 kg - used for nutrition calculations Body mass index is 52.57 kg/m. Usual Weight: ~141 kg  Assessment:  51 yo F with PMH of HF, HTN, HLD, seizures, T2DM, anxiety, bipolar, depression, pulmonary hypertension, and obesity presented to the ED on 11/13 with abdominal pain and found to have pneumoperitoneum on CT. Pt went to OR on 11/13 and found to have perforated sigmoid diverticulitis w/ peritonitis and abscess and underwent laprascopic peritoneal lavage w/ drain placement. On 11/17, pt was gradually restarted on CLD, advanced to soft diet on 11/18. Drain output became purulent on 11/20 and CT abdomen showed multiple large intraabdominal fluid collections and pt was made NPO on 11/21. Pt underwent CT-guided placement of 4 abdominal drains with feculent, foul smelling drainage output in IR on 11/21, concerning for abdominal abscess. Pharmacy has been consulted to initiate and manage TPN to allow for perforated sigmoid to heal over.  Surgery discussing possible diverting colostomy week of 11/29. 11/28 to start 48 hour calorie count.   Glucose / Insulin: BG 185-247,  A1c 5.9% (10/23), rSSI requiring 25 units/24h Electrolytes: Na 135 down trending, K 4.6 up trending, CoCa > 10.5, others wnl Renal: Scr 0.68, stable Hepatic: albumin <1.5, LFTs/AlkPhos/Tbili WNL, TG 146 Intake / Output; MIVF: abdominal drain output (total):58m; LBM: 11/27 (4x/24h); UOP not documented GI Imaging: 11/13 CT abd: sigmoid colitis and colonic perforation, likely ileus in mid jejunum 11/20 CT abd: multifocal intra-abdominal abscesses in the setting of perforated viscus, small volume perihepatic ascites, cholelithiasis 11/26 CT abd: Interval pigtail drain placements with significant decompression.  Persistent 4 cm gas and fluid collection posterior to the uterus, previously 6.2 cm GI Surgeries / Procedures:  11/13: laparoscopic peritoneal lavage w/ drain placement 11/21: CT-guided placement of 4 abdominal drains by IR  Central access: PICC placed 11/25 TPN start date: 12/23/21  Nutritional Goals: Goal TPN rate is 100 mL/hr (provides 130 g of protein and 2232 kcals per day)  RD Assessment: Estimated Needs Total Energy Estimated Needs: 2200-2400 Total Protein Estimated Needs: 115-135 grams Total Fluid Estimated Needs: >2.0 L  Current Nutrition:  TPN 11/27 Regular diet -documented 50% x2 meals 11/28 Supplements added- ate 55-75% x2 meals   Plan:  Continue TPN at goal rate of 1015mhr at 1800 to provide 130g protein and 2232 kcal/day, meeting ~100% of daily nutritional needs.  Note: Lipids have NOT been added to TPN in setting of active fungal infection.  Electrolytes in TPN: Increase Na 100 mEq/L; Decrease K 35 mEq/L, Ca 2 mEq/L; Continue Mg 8 mEq/L, and Phos 15 mmol/L. Cl:Ac 1:1 Add standard MVI and trace elements to TPN Team changed to Q6 hr rSSI on 11/27, adjust as needed  Add insulin regular 15 units to TPN Monitor TPN labs on Mon/Thurs F/u calorie count and ability to wean TPN   LyBenetta SparPharmD, BCPS, BCCP Clinical Pharmacist  Please check AMION for all [place] numbers After 10:00 PM, call [place]

## 2021-12-27 NOTE — Progress Notes (Signed)
Calorie Count Note: Day 1 Results  48-hour calorie count ordered. Calorie count started on 12/26/21 at lunch meal. Please see day 1 results below. No meal slips available in envelope for RD to review. Discussed with NT who will assist with calorie count today.  Spoke with pt at bedside. She reports that she did not eat any of the sandwich yesterday for lunch but did eat most of the baked chips. Pt does not remember what she had for dinner or how much she consumed. Documentation shows pt consumed 75% of dinner; however, HealthTouch diet software shows that pt did not receive a dinner meal tray. Nursing student in room reports that pt did not eat breakfast. Pt believes that she consumed 2-3 Ensure supplements yesterday. MAR documentation shows pt received 2 Ensure supplements.  Diet: Regular, thin liquids Supplements:  - Ensure Enlive po BID, each supplement provides 350 kcal and 20 grams of protein  Day 1: 11/28 Lunch: 140 kcal, 2 grams of protein (per pt report due to no documentation available) 11/28 Dinner: pt did not receive dinner meal tray per HealthTouch 11/29 Breakfast: 0 kcal, 0 grams of protein Supplements: 700 kcal, 40 grams of protein (2 Ensure Enlive per MAR)  Day 1 total 24-hour intake: 840 kcal (38% of minimum estimated needs)  42 grams of protein (37% of minimum estimated needs)  Nutrition Diagnosis: Inadequate oral intake related to altered GI function as evidenced by other (clear liquid diet order).  Goal: Patient will meet greater than or equal to 90% of their needs.  Intervention:  - Continue calorie count for 1 more day, RD to follow up tomorrow 11/30 with day 2 results - Continue TPN per pharmacy - Increase Ensure Enlive from BID to TID - Encourage PO intake   Gustavus Bryant, MS, RD, LDN Inpatient Clinical Dietitian Please see AMiON for contact information.

## 2021-12-27 NOTE — Progress Notes (Signed)
General Surgery Follow Up Note  Subjective:    No complaints. No nausea/emesis and passing flatus and having bowel movements. Tolerating diet but low appetite and undergoing calorie count  Objective:  Vital signs for last 24 hours: Temp:  [97.4 F (36.3 C)-98.4 F (36.9 C)] 98.2 F (36.8 C) (11/29 0522) Pulse Rate:  [93-121] 93 (11/29 0828) Resp:  [16-22] 22 (11/29 0828) BP: (131-178)/(85-96) 131/85 (11/29 0828) SpO2:  [93 %-97 %] 96 % (11/29 0828)  Hemodynamic parameters for last 24 hours:    Intake/Output from previous day: 11/28 0701 - 11/29 0700 In: 1742.9 [P.O.:360; I.V.:1121.2; IV Piggyback:241.7] Out: 172 [Drains:172]  Intake/Output this shift: No intake/output data recorded.  Vent settings for last 24 hours:    Physical Exam:  Gen: comfortable, no distress Neuro: non-focal exam Neck: supple Pulm: unlabored breathing Abd: soft, NT, drains in place, one feculent as below GU: clear yellow urine Extr: wwp, no edema  Drain 4 R ant RLQ with cloudy ss in bag and purulence at skin. 28m/24hr   Drain 2 L ant LLQ with 85 ml/24h feculent output    Results for orders placed or performed during the hospital encounter of 12/11/21 (from the past 24 hour(s))  Glucose, capillary     Status: Abnormal   Collection Time: 12/26/21 11:32 AM  Result Value Ref Range   Glucose-Capillary 185 (H) 70 - 99 mg/dL  Glucose, capillary     Status: Abnormal   Collection Time: 12/26/21  4:21 PM  Result Value Ref Range   Glucose-Capillary 221 (H) 70 - 99 mg/dL  Glucose, capillary     Status: Abnormal   Collection Time: 12/26/21  8:33 PM  Result Value Ref Range   Glucose-Capillary 203 (H) 70 - 99 mg/dL  Glucose, capillary     Status: Abnormal   Collection Time: 12/27/21 12:34 AM  Result Value Ref Range   Glucose-Capillary 247 (H) 70 - 99 mg/dL  CBC     Status: Abnormal   Collection Time: 12/27/21  3:05 AM  Result Value Ref Range   WBC 14.0 (H) 4.0 - 10.5 K/uL   RBC 3.59 (L)  3.87 - 5.11 MIL/uL   Hemoglobin 10.7 (L) 12.0 - 15.0 g/dL   HCT 33.9 (L) 36.0 - 46.0 %   MCV 94.4 80.0 - 100.0 fL   MCH 29.8 26.0 - 34.0 pg   MCHC 31.6 30.0 - 36.0 g/dL   RDW 15.2 11.5 - 15.5 %   Platelets 478 (H) 150 - 400 K/uL   nRBC 0.0 0.0 - 0.2 %  Basic metabolic panel     Status: Abnormal   Collection Time: 12/27/21  3:05 AM  Result Value Ref Range   Sodium 135 135 - 145 mmol/L   Potassium 4.6 3.5 - 5.1 mmol/L   Chloride 99 98 - 111 mmol/L   CO2 28 22 - 32 mmol/L   Glucose, Bld 219 (H) 70 - 99 mg/dL   BUN 9 6 - 20 mg/dL   Creatinine, Ser 0.68 0.44 - 1.00 mg/dL   Calcium 8.5 (L) 8.9 - 10.3 mg/dL   GFR, Estimated >60 >60 mL/min   Anion gap 8 5 - 15  Glucose, capillary     Status: Abnormal   Collection Time: 12/27/21  5:29 AM  Result Value Ref Range   Glucose-Capillary 213 (H) 70 - 99 mg/dL    Assessment & Plan:   Present on Admission:  Anxiety state  Bipolar I disorder (HCC)  Chronic combined systolic and diastolic  CHF (congestive heart failure) (HCC)  Essential hypertension, benign  Hyperlipidemia  Morbid obesity (HCC)  Type 2 diabetes mellitus with other specified complication (HCC)    LOS: 16 days    POD 16 s/p diagnostic lap washout, evacuation of abscess, and JP drain placement x2, Dr. Thermon Leyland 11/13 -CT 11/20 showed multiple loculated air and fluid collections throughout the abdomen and pelvis. CT 11/26 reviewed, now with seven drains. Continue antibiotics - this was likely perforated diverticultitis from beginning. There is still a possibility she may need colectomy and/or colostomy, but she has reached plateau and may be able to be managed just with drains, at least one of which is clearly feculent but low output ~ 40m over the last 24 hours. Can consider staged removal of surgical bulb drains if they remain serous - WBC up to 14 but abdominal exam stable. Tolerating diet. Recheck CBC am -mobilize, PT   FEN - soft VTE - sq heparin ID - currently  zosyn   GNR bacteremia  HTN Morbid obesity OSA CHF Bipolar disorder HLD Seizures  MWinferd Humphrey PGreenbriar Rehabilitation HospitalSurgery 12/27/2021, 10:43 AM Please see Amion for pager number during day hours 7:00am-4:30pm   12/27/2021

## 2021-12-27 NOTE — Progress Notes (Signed)
Pharmacy Antibiotic Note  [name] is a 51 y.o. female admitted on 12/11/2021 with intra-abdominal abscess s/p peritoneal lavage with drain placement. Patient has been receiving Zosyn therapy since 11/13 for IAI and bacteremia. Repeat CT scan on 11/20 showed multiple abscesses. IR placed new drains on 11/21. Pharmacy has been consulted for Zosyn dosing. Drain cultures from 11/21 identified yeast as Candida albicans.  Patient continues on fluconazole therapy per pharmacy.   Cefepime 11/13 >> 11/13 Flagyl 11/13 >>11/13 Zosyn 11/13 >> Micafungin 11/22 >> 11/25 Fluconazole 11/26 >>   Total abx day # 17, antifungal day # 8   Plan: Continue Zosyn 3.375g IV q8h (4 hour infusion). Continue Fluconazole '400mg'$  IV q24h  Monitor daily CBC, temp, SCr, and for clinical signs of improvement  F/u cultures and de-escalate antibiotics as able    Height: '5\' 5"'$  (165.1 cm) Weight: (!) 143.3 kg (315 lb 14.7 oz) IBW/kg (Calculated) : 57  Temp (24hrs), Avg:98.1 F (36.7 C), Min:97.4 F (36.3 C), Max:98.4 F (36.9 C)  Recent Labs  Lab 12/22/21 0320 12/23/21 0139 12/24/21 0412 12/25/21 0411 12/26/21 0301 12/27/21 0305  WBC 11.8* 12.1* 11.6* 10.4  --  14.0*  CREATININE 0.72 0.60 0.65 0.58 0.51 0.68     Estimated Creatinine Clearance: 118.8 mL/min (by C-G formula based on SCr of 0.68 mg/dL).    Allergies  Allergen Reactions   Savella [Milnacipran Hcl] Other (See Comments)    mania   Antimicrobials this admission:  Cefepime/Flagyl x1 11/13 Zosyn 11/13 >>  Micafungin 11/22 >> 11/25 Fluconazole 11/26 >>   Dose adjustments this admission:   Microbiology results:  11/13 blood: 2/4 bottles Bacteroides thetaiotaomicron, beta-lactamase pos, no BCID run 11/13 peritoneal fluid: E coli (R-amp only), abundant Bacteroides thetaiotaomicron - final 11/13 fungus peritoneal fluid: none observed, final pending 11/13 MRSA PCR: neg 11/21 abscess drain: gram stain w/ abundant Bacteroides (not  fragilis, beta-lactamase+), few Clostridium perfringens, abundant Candida albicans (final)   Thank you for allowing pharmacy to be a part of this patient's care. [employer], Pharm.D., BCPS Clinical Pharmacist Clinical phone for 12/27/2021 from 7:30-3:00 is [phone].  **Pharmacist phone directory can be found on [email or web address] listed under [place].  12/27/2021 9:52 AM

## 2021-12-27 NOTE — Progress Notes (Signed)
PROGRESS NOTE    [name]  TGY:[id number] DOB: 1970-09-19 DOA: 12/11/2021 PCP: [place], Betty G, MD    Brief Narrative:  [name] is a 51 years old female with past medical history obstructive sleep apnea/OHS on CPAP presented to our hospital on 12/11/2021 with abdominal pain.   CT scan of the abdomen pelvis showed free gas and fluid.  Patient was seen by general surgery and was taken emergently to the OR.  There was finding of well-formed abscess cavity in the lower mid abdomen near the sigmoid colon with purulent peritonitis and fibrinous exudate throughout the abdomen.  Findings was suspected secondary to perforated sigmoid diverticulitis.  She had laparoscopic peritoneal lavage with drains placed.  Patient was intubated postoperatively and was initially in the ICU and was subsequently was transferred to our service after successful extubation.   During hospitalization, general surgery has followed the patient.  Patient has multiple abdominal drains placed by general surgery and IR for for multiple air-fluid levels in the abdomen on CT scan.  General surgery following and has been advanced on diet.  Currently on TPN.     Assessment and Plan:  Principal Problem:   Pneumoperitoneum Active Problems:   Morbid obesity ([place])   Hyperlipidemia   Type 2 diabetes mellitus with other specified complication (HCC)   OSA on CPAP   Essential hypertension, benign   Bipolar I disorder (HCC)   Anxiety state   Chronic combined systolic and diastolic CHF (congestive heart failure) (HCC)   Colitis   Lactic acidosis   Perforation of sigmoid colon due to diverticulitis   Presumed perforated sigmoid diverticulitis with peritonitis and abscess - s/p laparascopic peritoneal lavage with drain placement. Status post IR guided multiple drain placement. General surgery following.  Currently on Zosyn and fluconazole.  Total of 7 drains 3 by general surgery and 4 by IR.  Feculent output  noted. CT scan of the abdomen and pelvis for reassessment 08/17/2021 with a multiple air fluid  levels.  WBC trending up.  Tmax of 98.4 F.  TPN and regular diet has been added.  Will follow General surgery recommendation.  Hx OSA/OHS Need nocturnal CPAP.  Acute kidney injury. Resolved.  Hx chronic HFpEF Essential hypertension,  hyperlipidemia 20 Echo from Feb 2022 with improved EF 60-65%, G1DD, Lasix on hold.  Compensated at this time.  Follows up with [name] as outpatient.   Hx Seizures, Bipolar disorder, depression. Continue alprazolam Lyrica and vraylar   History of GERD.  Continue Protonix.  Morbid obesity. Body mass index is 52.57 kg/m.  Would benefit from weight loss as outpatient.   DVT prophylaxis: heparin injection 5,000 Units Start: 12/12/21 0600   Code Status:     Code Status: Full code  Disposition:  Likely home with home health as per PT recommendation when okay with surgery.  Status is: Inpatient  Remains inpatient appropriate because: Status post abdominal surgery, antibiotic, multiple drains, general surgery following   Family Communication:  None at bedside.  Consultants:  General surgery. PCCM Interventional radiology  Procedures:  Laparoscopic peritoneal lavage with drain placement on 12/11/2021. Intubation and subsequent extubation. NG tube placement and self removal CT-guided drain tube placement x 4 by IR on 12/19/2021 Dr. Pascal Velasquez. PICC line placement 1125  Antimicrobials:  Zosyn IV 11/13> Micafungin 11/22 - 11/24 Fluconazole 11/25>> Metronidazole 11/13 - 11/13  Subjective: Today, patient was seen and examined at bedside.  Patient denies nausea vomiting or overt abdominal pain.  Denies any shortness of breath  cough chest pain.  Objective: Vitals:   12/27/21 0400 12/27/21 0402 12/27/21 0522 12/27/21 0828  BP: (!) 170/92 (!) 157/88 (!) 178/96 131/85  Pulse: (!) 121 (!) 119 (!) 118 93  Resp: 16  18 (!) 22  Temp: (!) 97.4 F (36.3  C)  98.2 F (36.8 C)   TempSrc: Oral  Oral   SpO2: 97%  96% 96%  Weight:      Height:        Intake/Output Summary (Last 24 hours) at 12/27/2021 0921 Last data filed at 12/27/2021 0641 Gross per 24 hour  Intake 1622.87 ml  Output 172 ml  Net 1450.87 ml    Filed Weights   12/11/21 2114 12/18/21 0432  Weight: (!) 138.6 kg (!) 143.3 kg    Physical Examination: Body mass index is 52.57 kg/m.   General: Morbidly built, not in obvious distress, appears older than stated age, chronically ill, HENT:   No scleral pallor or icterus noted. Oral mucosa is moist.  Chest:    Diminished breath sounds bilaterally. No crackles or wheezes.  CVS: S1 &S2 heard. No murmur.  Regular rate and rhythm. Abdomen: Soft, mild  abdominal pain on palpation, multiple abdominal drains .  Extremities: No cyanosis, clubbing or edema.  Peripheral pulses are palpable. Psych: Alert, awake and Communicative, flat affect CNS:  No cranial nerve deficits.  Power equal in all extremities.   Skin: Warm and dry.  Abdominal drains noted.  Data Reviewed:   CBC: Recent Labs  Lab 12/22/21 0320 12/23/21 0139 12/24/21 0412 12/25/21 0411 12/27/21 0305  WBC 11.8* 12.1* 11.6* 10.4 14.0*  HGB 11.0* 11.0* 10.8* 10.3* 10.7*  HCT 34.6* 33.9* 33.1* 31.9* 33.9*  MCV 93.8 91.6 93.0 93.0 94.4  PLT 560* 705* 657* 573* 478*     Basic Metabolic Panel: Recent Labs  Lab 12/20/21 2333 12/22/21 0320 12/23/21 0139 12/24/21 0412 12/25/21 0411 12/26/21 0301 12/27/21 0305  NA 139 141 142 141 139 138 135  K 4.2 4.0 4.7 3.6 3.8 4.3 4.6  CL 101 100 101 100 102 101 99  CO2 '27 26 28 30 28 27 28  '$ GLUCOSE 137* 151* 123* 194* 202* 194* 219*  BUN '8 8 8 6 7 8 9  '$ CREATININE 0.77 0.72 0.60 0.65 0.58 0.51 0.68  CALCIUM 8.6* 8.9 8.9 8.7* 8.4* 8.4* 8.5*  MG 2.0 1.8  --  1.9 1.9 1.9  --   PHOS  --   --   --  3.5 3.4 3.2  --      Liver Function Tests: Recent Labs  Lab 12/23/21 0923 12/24/21 0412 12/25/21 0411  AST '25 20 19   '$ ALT '14 11 12  '$ ALKPHOS 89 87 74  BILITOT 0.3 <0.1* 0.4  PROT 5.7* 5.4* 5.3*  ALBUMIN 1.7* 1.5* <1.5*      Radiology Studies: No results found.    LOS: 16 days    Jessica Lipps, MD [facility] Available via Epic secure chat 7am-7pm After these hours, please refer to coverage provider listed on [email or web address] 12/27/2021, 9:21 AM

## 2021-12-27 NOTE — TOC Progression Note (Signed)
Transition of Care [facility]) - Progression Note    Patient Details  Name: [name] MRN: [id number] Date of Birth: 1970-09-04  Transition of [facility]) CM/SW Contact  Tom-Johnson, Renea Ee, RN Phone Number: 12/27/2021, 3:08 PM  Clinical Narrative:     Patient noted to have multiple fluid and gas collections and was taken to surgery on 12/19/2021 for repeat drain placement through IR guidance. Patient currently has 7 drains. Continues on IV abx and TPN for nutrition.  Patient is also on a regular diet and tolerating at this time.  Per MD, patient may need Colectomy and/or Colostomy. CM will continue to follow as patient progresses with care towards discharge.     Expected Discharge Plan: [place] Barriers to Discharge: Continued Medical Work up  Expected Discharge Plan and Services Expected Discharge Plan: [place]   Discharge Planning Services: CM Consult Post Acute Care Choice: [place] arrangements for the past 2 months: Single Family Home                 DME Arranged: [place] rolling DME Agency: [employer] Date DME Agency Contacted: 12/13/21 Time DME Agency Contacted: [phone] Representative spoke with at DME Agency: Jodell Cipro HH Arranged: PT, RN [place] Agency: [place] Date [place]: 12/13/21 Time [place]: [place] Representative spoke with at [place]: [place] ([place]) Interventions    Readmission Risk Interventions    05/17/2019    9:25 AM  Readmission Risk Prevention Plan  Post Dischage Appt Complete  Medication Screening Complete  Transportation Screening Complete

## 2021-12-27 NOTE — Progress Notes (Signed)
PT Cancellation Note  Patient Details Name: [name] MRN: [id number] DOB: Jul 08, 1970   Cancelled Treatment:    Reason Eval/Treat Not Completed: Patient declined, no reason specified  Continuing education re: effects of bedrest;   Will follow up later today as time allows;  Otherwise, will follow up for PT tomorrow;   Thank you,  Roney Marion, [place] [facility] [place] 12/27/2021, 1:49 PM

## 2021-12-28 ENCOUNTER — Inpatient Hospital Stay (HOSPITAL_COMMUNITY): Payer: Medicare PPO

## 2021-12-28 ENCOUNTER — Inpatient Hospital Stay: Payer: Self-pay

## 2021-12-28 ENCOUNTER — Ambulatory Visit: Payer: BC Managed Care – PPO | Admitting: Adult Health

## 2021-12-28 DIAGNOSIS — K668 Other specified disorders of peritoneum: Secondary | ICD-10-CM | POA: Diagnosis not present

## 2021-12-28 DIAGNOSIS — F411 Generalized anxiety disorder: Secondary | ICD-10-CM | POA: Diagnosis not present

## 2021-12-28 DIAGNOSIS — K5720 Diverticulitis of large intestine with perforation and abscess without bleeding: Secondary | ICD-10-CM | POA: Diagnosis not present

## 2021-12-28 DIAGNOSIS — I10 Essential (primary) hypertension: Secondary | ICD-10-CM | POA: Diagnosis not present

## 2021-12-28 LAB — MAGNESIUM: Magnesium: 2.1 mg/dL (ref 1.7–2.4)

## 2021-12-28 LAB — CBC
HCT: 33.5 % — ABNORMAL LOW (ref 36.0–46.0)
Hemoglobin: 10.8 g/dL — ABNORMAL LOW (ref 12.0–15.0)
MCH: 29.8 pg (ref 26.0–34.0)
MCHC: 32.2 g/dL (ref 30.0–36.0)
MCV: 92.3 fL (ref 80.0–100.0)
Platelets: 379 10*3/uL (ref 150–400)
RBC: 3.63 MIL/uL — ABNORMAL LOW (ref 3.87–5.11)
RDW: 15.3 % (ref 11.5–15.5)
WBC: 13.4 10*3/uL — ABNORMAL HIGH (ref 4.0–10.5)
nRBC: 0 % (ref 0.0–0.2)

## 2021-12-28 LAB — COMPREHENSIVE METABOLIC PANEL
ALT: 22 U/L (ref 0–44)
AST: 34 U/L (ref 15–41)
Albumin: 1.7 g/dL — ABNORMAL LOW (ref 3.5–5.0)
Alkaline Phosphatase: 92 U/L (ref 38–126)
Anion gap: 12 (ref 5–15)
BUN: 12 mg/dL (ref 6–20)
CO2: 26 mmol/L (ref 22–32)
Calcium: 8.9 mg/dL (ref 8.9–10.3)
Chloride: 101 mmol/L (ref 98–111)
Creatinine, Ser: 0.61 mg/dL (ref 0.44–1.00)
GFR, Estimated: >60 mL/min (ref >60)
Glucose, Bld: 185 mg/dL — ABNORMAL HIGH (ref 70–99)
Potassium: 5.6 mmol/L — ABNORMAL HIGH (ref 3.5–5.1)
Sodium: 139 mmol/L (ref 135–145)
Total Bilirubin: <0.1 mg/dL — ABNORMAL LOW (ref 0.3–1.2)
Total Protein: 6.1 g/dL — ABNORMAL LOW (ref 6.5–8.1)

## 2021-12-28 LAB — GLUCOSE, CAPILLARY
Glucose-Capillary: 141 mg/dL — ABNORMAL HIGH (ref 70–99)
Glucose-Capillary: 159 mg/dL — ABNORMAL HIGH (ref 70–99)
Glucose-Capillary: 161 mg/dL — ABNORMAL HIGH (ref 70–99)
Glucose-Capillary: 231 mg/dL — ABNORMAL HIGH (ref 70–99)

## 2021-12-28 LAB — PHOSPHORUS: Phosphorus: 3.7 mg/dL (ref 2.5–4.6)

## 2021-12-28 MED ORDER — TRAVASOL 10 % IV SOLN
INTRAVENOUS | Status: AC
  Filled 2021-12-28: qty 1296

## 2021-12-28 MED ORDER — AMOXICILLIN-POT CLAVULANATE 875-125 MG PO TABS
1.0000 | ORAL_TABLET | Freq: Once | ORAL | Status: AC
  Administered 2021-12-28: 1 via ORAL
  Filled 2021-12-28: qty 1

## 2021-12-28 MED ORDER — ALPRAZOLAM 0.5 MG PO TABS
0.5000 mg | ORAL_TABLET | Freq: Three times a day (TID) | ORAL | Status: DC
  Administered 2021-12-29 – 2022-01-05 (×21): 0.5 mg via ORAL
  Filled 2021-12-28 (×23): qty 1

## 2021-12-28 MED ORDER — SODIUM ZIRCONIUM CYCLOSILICATE 10 G PO PACK
10.0000 g | PACK | Freq: Once | ORAL | Status: AC
  Administered 2021-12-28: 10 g via ORAL
  Filled 2021-12-28: qty 1

## 2021-12-28 NOTE — Progress Notes (Signed)
Spoke with Juluis Mire, RN caring for this pt. RN reports pt unstable at this time with decreasing mentation. Had rapid response at bedside this AM. IVT consult for PIV placement recommended. Will continue to follow up.

## 2021-12-28 NOTE — Progress Notes (Signed)
PROGRESS NOTE    [name]  NOI:[id number] DOB: 1970-09-14 DOA: 12/11/2021 PCP: [place], Betty G, MD    Brief Narrative:  [name] is a 51 years old female with past medical history obstructive sleep apnea/OHS on CPAP presented to our hospital on 12/11/2021 with abdominal pain.   CT scan of the abdomen pelvis showed free gas and fluid.  Patient was seen by general surgery and was taken emergently to the OR.  There was finding of well-formed abscess cavity in the lower mid abdomen near the sigmoid colon with purulent peritonitis and fibrinous exudate throughout the abdomen.  Findings was suspected secondary to perforated sigmoid diverticulitis.  She had laparoscopic peritoneal lavage with drains placed.  Patient was intubated postoperatively and was initially in the ICU and was subsequently was transferred to our service after successful extubation.   During hospitalization, general surgery has followed the patient.  Patient has multiple abdominal drains placed by general surgery and IR for for multiple air-fluid levels in the abdomen on CT scan.  General surgery following and has been advanced on diet.  Currently on TPN.     Assessment and Plan:  Principal Problem:   Pneumoperitoneum Active Problems:   Morbid obesity ([place])   Hyperlipidemia   Type 2 diabetes mellitus with other specified complication (HCC)   OSA on CPAP   Essential hypertension, benign   Bipolar I disorder (HCC)   Anxiety state   Chronic combined systolic and diastolic CHF (congestive heart failure) (HCC)   Colitis   Lactic acidosis   Perforation of sigmoid colon due to diverticulitis   Presumed perforated sigmoid diverticulitis with peritonitis and abscess - s/p laparascopic peritoneal lavage with drain placement. Status post IR guided multiple drain placement.  Currently on Zosyn and fluconazole.  Multiple drains in the abdomen.  General surgery following.  CT scan of the abdomen and pelvis for  reassessment 08/17/2021 with a multiple air fluid  levels.  Currently on TPN and diet has been advanced to regular.  General surgery planning to continue conservative treatment.  Hx OSA/OHS Need nocturnal CPAP.  Has not been compliant.  Emphasized the need for wearing it.  Acute kidney injury. Resolved.  Latest creatinine at 0.6.  Mild hyperkalemia.  Addressed with TPN supplementation.  Check levels in AM.  Hx chronic HFpEF Essential hypertension,  hyperlipidemia 20 Echo from Feb 2022 with improved EF 60-65%, G1DD, Lasix on hold.  Compensated at this time.  Follows up with cardiology as outpatient.   Hx Seizures, Bipolar disorder, depression. On alprazolam Lyrica and vraylar.  Hold this morning.  History of GERD.  Continue Protonix.  Morbid obesity. Body mass index is 52.57 kg/m.  Would benefit from weight loss as outpatient.   DVT prophylaxis: heparin injection 5,000 Units Start: 12/12/21 0600   Code Status:     Code Status: Full code  Disposition:  Likely home with home health as per PT recommendation when okay with surgery.  Status is: Inpatient  Remains inpatient appropriate because: Status post abdominal surgery, IV antibiotic, multiple drains, general surgery following   Family Communication:  Spoke with the patient's relative Ms. [name] at bedside.  Consultants:  General surgery. PCCM Interventional radiology  Procedures:  Laparoscopic peritoneal lavage with drain placement on 12/11/2021. Intubation and subsequent extubation. NG tube placement and self removal CT-guided drain tube placement x 4 by IR on 12/19/2021 Dr. Pascal Lux. PICC line placement 1125  Antimicrobials:  Zosyn IV 11/13> Micafungin 11/22 - 11/24 Fluconazole 11/25>> Metronidazole 11/13 -  11/13  Subjective: Today, patient was seen and examined at bedside.  Denies any nausea vomiting fever chills or rigor.  Nursing staff reported that she was little confused in was trying to pull lines  yesterday.  Appeared to be little sleepy this morning.    Objective: Vitals:   12/27/21 0828 12/27/21 2102 12/28/21 0616 12/28/21 1100  BP: 131/85 (!) 159/94 (!) 158/88 (!) 165/93  Pulse: 93 (!) 113 99 (!) 127  Resp: (!) '22 20 20 18  '$ Temp:  98.1 F (36.7 C) 98 F (36.7 C) 98.1 F (36.7 C)  TempSrc:  Oral Oral Axillary  SpO2: 96% 91% 92% (!) 87%  Weight:      Height:        Intake/Output Summary (Last 24 hours) at 12/28/2021 1148 Last data filed at 12/28/2021 1100 Gross per 24 hour  Intake 1840.25 ml  Output 51 ml  Net 1789.25 ml    Filed Weights   12/11/21 2114 12/18/21 0432  Weight: (!) 138.6 kg (!) 143.3 kg    Physical Examination: Body mass index is 52.57 kg/m.   General: Morbidly obese, mildly somnolent, not in obvious distress, appears chronically ill, HENT:   No scleral pallor or icterus noted. Oral mucosa is moist.  Chest:   Diminished breath sounds bilaterally. CVS: S1 &S2 heard. No murmur.  Regular rate and rhythm. Abdomen:  soft abdomen with multiple abdominal drains. Extremities: No cyanosis, clubbing or edema.  Peripheral pulses are palpable. Psych: Alert, awake and Communicative, flat affect, mildly somnolent CNS:  No cranial nerve deficits.  Power equal in all extremities.   Skin: Warm and dry.  Multiple abdominal drains noted with dressing.  Data Reviewed:   CBC: Recent Labs  Lab 12/23/21 0139 12/24/21 0412 12/25/21 0411 12/27/21 0305 12/28/21 0948  WBC 12.1* 11.6* 10.4 14.0* 13.4*  HGB 11.0* 10.8* 10.3* 10.7* 10.8*  HCT 33.9* 33.1* 31.9* 33.9* 33.5*  MCV 91.6 93.0 93.0 94.4 92.3  PLT 705* 657* 573* 478* 379     Basic Metabolic Panel: Recent Labs  Lab 12/22/21 0320 12/23/21 0139 12/24/21 0412 12/25/21 0411 12/26/21 0301 12/27/21 0305 12/28/21 0948  NA 141   < > 141 139 138 135 139  K 4.0   < > 3.6 3.8 4.3 4.6 5.6*  CL 100   < > 100 102 101 99 101  CO2 26   < > '30 28 27 28 26  '$ GLUCOSE 151*   < > 194* 202* 194* 219* 185*  BUN  8   < > '6 7 8 9 12  '$ CREATININE 0.72   < > 0.65 0.58 0.51 0.68 0.61  CALCIUM 8.9   < > 8.7* 8.4* 8.4* 8.5* 8.9  MG 1.8  --  1.9 1.9 1.9  --  2.1  PHOS  --   --  3.5 3.4 3.2  --  3.7   < > = values in this interval not displayed.     Liver Function Tests: Recent Labs  Lab 12/23/21 0923 12/24/21 0412 12/25/21 0411 12/28/21 0948  AST '25 20 19 '$ 34  ALT '14 11 12 22  '$ ALKPHOS 89 87 74 92  BILITOT 0.3 <0.1* 0.4 <0.1*  PROT 5.7* 5.4* 5.3* 6.1*  ALBUMIN 1.7* 1.5* <1.5* 1.7*      Radiology Studies: No results found.    LOS: 17 days    Flora Lipps, MD [facility] Available via Epic secure chat 7am-7pm After these hours, please refer to coverage provider listed on [email or web address] 12/28/2021, 11:48  AM    

## 2021-12-28 NOTE — Progress Notes (Signed)
   12/28/21 1201  Assess: MEWS Score  Temp 97.7 F (36.5 C)  BP 134/71  MAP (mmHg) 84  Pulse Rate (!) 131  Resp (!) 32  SpO2 91 %  O2 Device CPAP  O2 Flow Rate (L/min) 2 L/min  Assess: MEWS Score  MEWS Temp 0  MEWS Systolic 0  MEWS Pulse 3  MEWS RR 2  MEWS LOC 2  MEWS Score 7  MEWS Score Color Red  Assess: if the MEWS score is Yellow or Red  Were vital signs taken at a resting state? Yes  Focused Assessment Change from prior assessment (see assessment flowsheet)  Does the patient meet 2 or more of the SIRS criteria? Yes  Does the patient have a confirmed or suspected source of infection? Yes  Provider and Rapid Response Notified? Yes  MEWS guidelines implemented *See Row Information* Yes  Treat  MEWS Interventions Escalated (See documentation below)  Complains of Other (Comment) (hard to arouse)  Interventions Other (comment) (CPAP)  Take Vital Signs  Increase Vital Sign Frequency  Red: Q 1hr X 4 then Q 4hr X 4, if remains red, continue Q 4hrs  Escalate  MEWS: Escalate Red: discuss with charge nurse/RN and provider, consider discussing with RRT  Notify: Charge Nurse/RN  Name of Charge Nurse/RN Notified Aurther Loft, RN  Date Charge Nurse/RN Notified 12/28/21  Time Charge Nurse/RN Notified 1200  Provider Notification  Provider Name/Title Dr. Louanne Belton  Date Provider Notified 12/28/21  Time Provider Notified 1200  Method of Notification Page  Notification Reason Change in status  Provider response Other (Comment) (Monitor)  Date of Provider Response 12/28/21  Time of Provider Response 1201  Notify: Rapid Response  Name of Rapid Response RN Notified Helle-RN  Date Rapid Response Notified 12/28/21  Time Rapid Response Notified 7482  Document  Patient Outcome Other (Comment) (will contiue to monior)  Progress note created (see row info) Yes  Assess: SIRS CRITERIA  SIRS Temperature  0  SIRS Pulse 1  SIRS Respirations  1  SIRS WBC 1  SIRS Score Sum  3

## 2021-12-28 NOTE — Progress Notes (Signed)
General Surgery Follow Up Note  Subjective:    No new complaints. Loose BM yesterday evening per SIL who is bedside. Still low appetite  Objective:  Vital signs for last 24 hours: Temp:  [98 F (36.7 C)-98.1 F (36.7 C)] 98 F (36.7 C) (11/30 0616) Pulse Rate:  [99-113] 99 (11/30 0616) Resp:  [20] 20 (11/30 0616) BP: (158-159)/(88-94) 158/88 (11/30 0616) SpO2:  [91 %-92 %] 92 % (11/30 0616)  Hemodynamic parameters for last 24 hours:    Intake/Output from previous day: 11/29 0701 - 11/30 0700 In: 1720.3 [P.O.:480; I.V.:748.7; IV Piggyback:491.6] Out: 0   Intake/Output this shift: Total I/O In: 120 [P.O.:120] Out: -   Vent settings for last 24 hours:    Physical Exam:  Gen: comfortable, no distress Neuro: non-focal exam Neck: supple Pulm: unlabored breathing Abd: soft, NT, drains in place, one feculent in LLQ and one with purulent discharge at skin level in RLQ Extr: wwp, no edema    Results for orders placed or performed during the hospital encounter of 12/11/21 (from the past 24 hour(s))  Glucose, capillary     Status: Abnormal   Collection Time: 12/27/21 11:54 AM  Result Value Ref Range   Glucose-Capillary 225 (H) 70 - 99 mg/dL  Glucose, capillary     Status: Abnormal   Collection Time: 12/27/21  5:39 PM  Result Value Ref Range   Glucose-Capillary 202 (H) 70 - 99 mg/dL  Glucose, capillary     Status: Abnormal   Collection Time: 12/28/21 12:03 AM  Result Value Ref Range   Glucose-Capillary 231 (H) 70 - 99 mg/dL  Glucose, capillary     Status: Abnormal   Collection Time: 12/28/21  6:15 AM  Result Value Ref Range   Glucose-Capillary 141 (H) 70 - 99 mg/dL  CBC     Status: Abnormal   Collection Time: 12/28/21  9:48 AM  Result Value Ref Range   WBC 13.4 (H) 4.0 - 10.5 K/uL   RBC 3.63 (L) 3.87 - 5.11 MIL/uL   Hemoglobin 10.8 (L) 12.0 - 15.0 g/dL   HCT 33.5 (L) 36.0 - 46.0 %   MCV 92.3 80.0 - 100.0 fL   MCH 29.8 26.0 - 34.0 pg   MCHC 32.2 30.0 - 36.0  g/dL   RDW 15.3 11.5 - 15.5 %   Platelets 379 150 - 400 K/uL   nRBC 0.0 0.0 - 0.2 %    Assessment & Plan:   Present on Admission:  Anxiety state  Bipolar I disorder (HCC)  Chronic combined systolic and diastolic CHF (congestive heart failure) (HCC)  Essential hypertension, benign  Hyperlipidemia  Morbid obesity ([place])  Type 2 diabetes mellitus with other specified complication (HCC)    LOS: 17 days    POD 17 s/p diagnostic lap washout, evacuation of abscess, and JP drain placement x2, Dr. Thermon Leyland 11/13 -CT 11/20 showed multiple loculated air and fluid collections throughout the abdomen and pelvis. CT 11/26 reviewed, now with seven drains. Continue antibiotics - this was likely perforated diverticultitis from beginning. There is still a possibility she may need colectomy and/or colostomy, but she has reached plateau and may be able to be managed just with drains, LLQ IR drain is clearly feculent but low output. RLQ IR drain with purulent discharge around drain - start staged removal of surgical drains today with LUQ drain which has been serous - WBC 13.4 (14), abdominal exam stable. Tolerating diet. monitor -mobilize, PT   FEN - soft, ensure VTE - sq  heparin ID - currently zosyn   GNR bacteremia  HTN Morbid obesity OSA CHF Bipolar disorder HLD Seizures  Winferd Humphrey, [facility] Surgery 12/28/2021, 10:20 AM Please see Amion for pager number during day hours 7:00am-4:30pm   12/28/2021

## 2021-12-28 NOTE — Progress Notes (Signed)
ABG sent to lab. Per lab blood clotted. Patient OOB sitting up in chair. Alert oriented X3 at this time. Per MD, no repeat at this time. RT will encourage CPAP use HS and during naps.

## 2021-12-28 NOTE — Progress Notes (Signed)
Calorie Count Note  48 hour calorie count ordered. Recommend extending calorie count due to inadequate po and attempting to wean off TPN. Pt did not consume any solid foods yesterday or this morning. Per chart review, pt had rapid response called this morning due to decreasing mentation. Unlikely to consume much po with AMS. Recommend continuing TPN as ordered. Reassess tomorrow morning, 12/1.   Diet: Regular Supplements: Ensure Enlive  Breakfast: 0% Lunch: 0% Dinner: 0% Supplements: 3 Ensure Enlive  Total intake: 1050 kcal (48% of minimum estimated needs)  60g protein (52% of minimum estimated needs)  Estimated Nutritional Needs:  Calories:  2200-2400kcal Protein:  115-135 grams Fluid:  >2.0 L  Nutrition Dx: Inadequate oral intake related to altered GI function and AMS as evidenced by pt meeting <50% calorie needs with po intake  Goal: Patient will meet greater than or equal to 90% of their needs.   Intervention: Continue full strength TPN this evening Reassess calorie count in AM and adjust nutrition support as clinically appropriate Continue Ensure Enlive TID Continue Calorie Count for additional 24hrs Encourage po intake as clinically appropriate  Candise Bowens, MS, RD, LDN, CNSC See AMiON for contact information

## 2021-12-28 NOTE — Progress Notes (Signed)
Consult for PICC assessment post patient retraction. PICC found to be retracted 10 cm. Both ports saline locked. Clear dressing applied to site. [place] RN to seek order to exchange PICC. Instructed not to use.

## 2021-12-28 NOTE — Significant Event (Signed)
Rapid Response Event Note   Reason for Call :  Increased somnolence, some confusion over night RED MEWS  Initial Focused Assessment:  Per RN patient has been sleeping on her Cpap for the past hour.  She more sleeping than usual.    Lung sounds decreased bases 1 + edema Multiple drains right and left abdomen.  RN has recently emptied.  BP 134/71  HR 130  RR 24  O2 sat 91% on Cpap  Temp 97  Patient seemed a little restless, readjusting her CPap mask.  She denies shortness of breath.  Interventions:  IV team at bedside placing PIV RT drawing ABG Portable Xray  Post activity she is more awake, removed Cpap mask and PT at bedside.  See PT note for details...  She was able to ambulate to chair.  Taught pt how to [place] IS.  Encouraged IS use q 1 hour while awake.  Plan of Care:  RN to call if patient become difficult to arouse or has worsening VS Exchange PICC later with IV team MEWS guidelines: VS q 1 hour x 2 then q 4 hours until remains green for 12 hours.   Event Summary:   MD Notified: [place] (RN notified prior to my arrival) Call Time:  [place] Time:  1217 End Time:  [place]  Raliegh Ip, RN

## 2021-12-28 NOTE — Progress Notes (Signed)
Patient resting well on home CPAP.

## 2021-12-28 NOTE — Progress Notes (Signed)
Mobility Specialist Progress Note:   12/28/21 1500  Mobility  Activity Transferred from chair to bed  Level of Assistance Minimal assist, patient does 75% or more  Assistive Device Front wheel walker  Distance Ambulated (ft) 5 ft  Activity Response Tolerated well  Mobility Referral Yes  $Mobility charge 1 Mobility   IV team requesting pt get back to bed. Required minA to stand from chair, minG for pivotal steps to bed. Pt tolerated well. Left with all needs met, RN in room.  Nelta Numbers Mobility Specialist Please contact via SecureChat or  Rehab office at [phone]

## 2021-12-28 NOTE — Progress Notes (Signed)
Physical Therapy Treatment Patient Details Name: [name] MRN: [id number] DOB: March 30, 1970 Today's Date: 12/28/2021   History of Present Illness 51 y.o. female presents to [facility] hospital on 12/11/2021 with abdominal pain, found to have perforated sigmoid diverticulitis. Pt underwent laparoscopy with washout and drain placement on 11/13. Pt extubated 11/14. PICC placed 22/97/98 Right Basilic. PMH includes OSA, systolic HF, anxiety, OA, asthma, bipolar disorder, depression.    PT Comments    Events noted from earlier this afternoon with RR called. Patient seemingly more alert and willing to mobilize with PT. Required a little more assist for mobility, min assist for bed, transfer, and gait today. More fatigued with SpO2 93% on RA, moderately dyspneic, and HR to 144 max while working with PT. Pt navigated throughout room but fatigued quickly today. Willing to sit upright in recliner. Primary RN and RR RN in room with patient. Patient will continue to benefit from skilled physical therapy services to further improve independence with functional mobility.    Recommendations for follow up therapy are one component of a multi-disciplinary discharge planning process, led by the attending physician.  Recommendations may be updated based on patient status, additional functional criteria and insurance authorization.  Follow Up Recommendations  Home health PT     Assistance Recommended at Discharge Intermittent Supervision/Assistance  Patient can return home with the following A little help with walking and/or transfers;A little help with bathing/dressing/bathroom;Assistance with cooking/housework;Assist for transportation;Help with stairs or ramp for entrance   Equipment Recommendations  Rolling walker (2 wheels)    Recommendations for Other Services       Precautions / Restrictions Precautions Precautions: Fall Precaution Comments: 7 drains - and a PICC line Restrictions Weight Bearing  Restrictions: No     Mobility  Bed Mobility Overal bed mobility: Needs Assistance Bed Mobility: Supine to Sit     Supine to sit: Min assist     General bed mobility comments: min assist to rise to EOB, patient pulling through PT hand, use of rail as able. Assisted with multiple drains. Effortful today, HR to 120s.    Transfers Overall transfer level: Needs assistance Equipment used: Rolling walker (2 wheels) Transfers: Sit to/from Stand Sit to Stand: Min assist           General transfer comment: Min assist for boost from bed with cues for hand placement. A couple of attempts to complete successfully.    Ambulation/Gait Ambulation/Gait assistance: Min assist Gait Distance (Feet): 25 Feet Assistive device: Rolling walker (2 wheels) Gait Pattern/deviations: Step-through pattern, Decreased stride length, Wide base of support Gait velocity: decreased Gait velocity interpretation: <1.31 ft/sec, indicative of household ambulator   General Gait Details: Min assist for RW control closer to proximity. Required several short standing rest breaks and VC to facilitate at times. Moderate DOE. SpO2 93% on RA. Very fatigued with this short distance today. HR to 144bpm.   Stairs             Wheelchair Mobility    Modified Rankin (Stroke Patients Only)       Balance Overall balance assessment: Needs assistance Sitting-balance support: No upper extremity supported, Feet supported Sitting balance-Leahy Scale: Good     Standing balance support: No upper extremity supported, During functional activity Standing balance-Leahy Scale: Fair                              Cognition Arousal/Alertness: Awake/alert Behavior During Therapy: WFL for tasks assessed/performed  Overall Cognitive Status: Impaired/Different from baseline Area of Impairment: Problem solving                             Problem Solving: Slow processing, Requires verbal cues           Exercises      General Comments        Pertinent Vitals/Pain Pain Assessment Pain Assessment: Faces Pain Score: 4  Pain Location: abdomen Pain Descriptors / Indicators: Sore Pain Intervention(s): Monitored during session, Repositioned    Home Living                          Prior Function            PT Goals (current goals can now be found in the care plan section) Acute Rehab PT Goals Patient Stated Goal: be independent again PT Goal Formulation: With patient Time For Goal Achievement: 12/27/21 Potential to Achieve Goals: Good Progress towards PT goals: Progressing toward goals    Frequency    Min 3X/week      PT Plan Current plan remains appropriate    Co-evaluation              AM-PAC PT "6 Clicks" Mobility   Outcome Measure  Help needed turning from your back to your side while in a flat bed without using bedrails?: A Little Help needed moving from lying on your back to sitting on the side of a flat bed without using bedrails?: A Little Help needed moving to and from a bed to a chair (including a wheelchair)?: A Little Help needed standing up from a chair using your arms (e.g., wheelchair or bedside chair)?: A Little Help needed to walk in hospital room?: A Little Help needed climbing 3-5 steps with a railing? : A Lot 6 Click Score: 17    End of Session Equipment Utilized During Treatment: Gait belt Activity Tolerance: Patient limited by fatigue Patient left: with call bell/phone within reach;in chair;with nursing/sitter in room Nurse Communication: Mobility status PT Visit Diagnosis: Other abnormalities of gait and mobility (R26.89);Muscle weakness (generalized) (M62.81);Pain Pain - part of body:  (abdomen)     Time: 1610-9604 PT Time Calculation (min) (ACUTE ONLY): 23 min  Charges:  $Gait Training: 8-22 mins $Therapeutic Activity: 8-22 mins                     Candie Mile, PT, DPT Physical Therapist Acute  Rehabilitation Services [place] 12/28/2021, 3:50 PM

## 2021-12-28 NOTE — Progress Notes (Signed)
PHARMACY - TOTAL PARENTERAL NUTRITION CONSULT NOTE   Indication:  abdominal abscess  Patient Measurements: Height: '5\' 5"'$  (165.1 cm) Weight: (!) 143.3 kg (315 lb 14.7 oz) IBW/kg (Calculated) : 57  Adjusted Body Weight: 91 kg - used for nutrition calculations Body mass index is 52.57 kg/m. Usual Weight: ~141 kg  Assessment:  51 yo F with PMH of HF, HTN, HLD, seizures, T2DM, anxiety, bipolar, depression, pulmonary hypertension, and obesity presented to the ED on 11/13 with abdominal pain and found to have pneumoperitoneum on CT. Pt went to OR on 11/13 and found to have perforated sigmoid diverticulitis w/ peritonitis and abscess and underwent laprascopic peritoneal lavage w/ drain placement. On 11/17, pt was gradually restarted on CLD, advanced to soft diet on 11/18. Drain output became purulent on 11/20 and CT abdomen showed multiple large intraabdominal fluid collections and pt was made NPO on 11/21. Pt underwent CT-guided placement of 4 abdominal drains with feculent, foul smelling drainage output in IR on 11/21, concerning for abdominal abscess. Pharmacy has been consulted to initiate and manage TPN to allow for perforated sigmoid to heal over.  Surgery discussing possible diverting colostomy week of 11/29. 11/28 to start 48 hour calorie count.   Glucose / Insulin: BG 141-231,  A1c 5.9% (10/23), rSSI requiring 24 units/24h Electrolytes: K 5.6, CoCa 10.74, others wnl Renal: Scr 0.6, stable Hepatic: albumin 1.7, LFTs/AlkPhos/Tbili WNL, TG 146 Intake / Output: UOP not documented, drains 172 ml (LLQ IR drain is clearly feculent but low output. RLQ IR drain with purulent discharge around drain); LBM: 11/28 x2  GI Imaging: 11/13 CT abd: sigmoid colitis and colonic perforation, likely ileus in mid jejunum 11/20 CT abd: multifocal intra-abdominal abscesses in the setting of perforated viscus, small volume perihepatic ascites, cholelithiasis 11/26 CT abd: Interval pigtail drain placements with  significant decompression. Persistent 4 cm gas and fluid collection posterior to the uterus, previously 6.2 cm GI Surgeries / Procedures:  11/13: laparoscopic peritoneal lavage w/ drain placement 11/21: CT-guided placement of 4 abdominal drains by IR 11/30 start staged removal of drains  Central access: 11/25 PICC placed > 11/30 retracted 10cm > planned replacement TPN start date: 12/23/21  Nutritional Goals: Goal TPN rate is 100 mL/hr (provides 130 g of protein and 2232 kcals per day)  RD Assessment: Estimated Needs Total Energy Estimated Needs: 2200-2400 Total Protein Estimated Needs: 115-135 grams Total Fluid Estimated Needs: >2.0 L  Current Nutrition:  TPN 11/27 Regular diet -documented 50% x2 meals 11/28 Calorie count start   Day 1: Patient ate 840kcal and 42g protein over 24 hours    Day 2: only drank 1 ensure >> Continue goal TPN per RD  11/29 TPN was stopped at 2:45am due to PICC retracted, pharmacy was not notified and D10 was not initiated. No hypoglycemia. Discussed with day shift RN.   Plan:  Continue TPN at goal 100 mL/hr at 1800 to provide 130g protein and 2232 kcal/day, meeting ~100% of daily nutritional needs.  Note: Lipids have NOT been added to TPN in setting of active fungal infection.  Electrolytes in TPN: Decrease K 5 mEq/L, Ca 0 mEq/L, and Phos 12 mmol/L; Continue Na 100 mEq/L Mg 8 mEq/L,  Cl:Ac 1:1 Add standard MVI and trace elements to TPN Give lokelma x1 for hyperkalemia ok by MD Team changed to Q6 hr rSSI on 11/27, adjust as needed  Increase insulin regular to 25 units in TPN Monitor TPN labs on 12/1 then Mon/Thurs F/u calorie count and ability to wean TPN  Benetta Spar, PharmD, BCPS, BCCP Clinical Pharmacist  Please check AMION for all [place] phone numbers After 10:00 PM, call [place] [phone]

## 2021-12-28 NOTE — Progress Notes (Signed)
Referring Physician(s): Dr. Johnathan Hausen  Supervising Physician: Daryll Brod  Patient Status:  [facility] - In-pt  Chief Complaint: Perforated diverticulitis s/p ex lap peritoneal lavage with drain placement on 11/13, f/u CT on 11/20 showed persistent intraabdominal fluid collections  S/p RUQ, RLQ, LUQ, LLQ drain placement by Dr. Pascal Lux on 11/22   Subjective:  Pt sitting in a chair watching TV, NAD, states that she is going fine.  Denies abd pain, N/V.   Allergies: Savella [milnacipran hcl]  Medications: Prior to Admission medications   Medication Sig Start Date End Date Taking? Authorizing Provider  acetaminophen (TYLENOL) 500 MG tablet Take 1,000 mg by mouth every 6 (six) hours as needed for moderate pain.    Yes [provider]  albuterol (VENTOLIN HFA) 108 (90 Base) MCG/ACT inhaler Inhale 2 puffs into the lungs every 6 (six) hours as needed for wheezing or shortness of breath. 10/06/20  Yes Rigoberto Noel, MD  ALPRAZolam Duanne Moron) 1 MG tablet Take 1 mg by mouth 3 (three) times daily.   Yes [provider]  amphetamine-dextroamphetamine (ADDERALL) 20 MG tablet Take 20 mg by mouth in the morning, at noon, in the evening, and at bedtime.    Yes [provider]  Ascorbic Acid (VITAMIN C) 500 MG tablet Take 1,000 mg by mouth daily.   Yes [provider]  aspirin 81 MG tablet Take 1 tablet (81 mg total) by mouth daily. 05/21/19  Yes Costella, Vista Mink, PA-C  atorvastatin (LIPITOR) 20 MG tablet TAKE 1 TABLET BY MOUTH EVERY DAY IN THE EVENING Patient taking differently: Take 20 mg by mouth daily. 03/15/21  Yes Minus Breeding, MD  b complex vitamins tablet Take 1 tablet by mouth daily.   Yes [provider]  Calcium Citrate (CITRACAL PO) Take 2 tablets by mouth daily.   Yes [provider]  Cholecalciferol (VITAMIN D3) 5000 UNITS TABS Take 10,000 Units by mouth in the morning and at bedtime.   Yes [provider]  Chromium  Picolinate 500 MCG TABS Take 500 mcg by mouth daily.    Yes [provider]  diclofenac Sodium (VOLTAREN) 1 % GEL Apply 2 g topically 3 (three) times daily as needed (knee pain).   Yes [provider]  furosemide (LASIX) 40 MG tablet Take 2 tablets (80 mg total) by mouth 2 (two) times daily. Patient taking differently: Take 40-80 mg by mouth See admin instructions. Take 80 mg in the morning and 40 mg in the evening 12/05/21  Yes [name], Jeneen Rinks, MD  [name] 1.5/30 1.5-30 MG-MCG tablet Take 1 tablet by mouth at bedtime. 08/03/19  Yes [provider]  levalbuterol (XOPENEX HFA) 45 MCG/ACT inhaler Inhale 2 puffs into the lungs every 6 (six) hours as needed for wheezing. 10/14/20  Yes Rigoberto Noel, MD  lidocaine (LIDODERM) 5 % PLACE 1 PATCH ONTO THE SKIN DAILY AS NEEDED FOR KNEE PAIN. REMOVE AND DISCARD PATCH WITHIN 12 HOURS OR AS DIRECTED BY DOCTOR. Patient taking differently: Place 1 patch onto the skin as needed (knee pain). 05/22/21  Yes Martinique, Betty G, MD  losartan (COZAAR) 25 MG tablet Take 1 tablet (25 mg total) by mouth 2 (two) times daily. 12/05/21  Yes [name], Jeneen Rinks, MD  LYRICA 300 MG capsule Take 300 mg by mouth 2 (two) times daily.  03/26/11  Yes [provider]  MAGNESIUM PO Take 500 mg by mouth daily.    Yes [provider]  Multiple Vitamin (MULTIVITAMIN) tablet Take 1 tablet  by mouth daily.   Yes [provider]  NUEDEXTA 20-10 MG CAPS Take 1 capsule by mouth every 12 (twelve) hours.  09/27/16  Yes [provider]  Omega-3 Fatty Acids (FISH OIL) 1200 MG CAPS Take 2,400 mg by mouth daily.    Yes [provider]  potassium chloride SA (KLOR-CON M20) 20 MEQ tablet Take 1 tablet (20 mEq total) by mouth daily. 04/14/21  Yes [name], Jeneen Rinks, MD  VRAYLAR 6 MG CAPS Take 6 mg by mouth every evening.  09/03/16  Yes [provider]  Zinc 50 MG CAPS Take 50 mg by mouth in the morning and at bedtime.   Yes [provider]  Blood Glucose Monitoring Suppl (ACCU-CHEK AVIVA PLUS) w/Device KIT As directed. 11/14/21   Martinique, Betty G, MD  methocarbamol (ROBAXIN-750) 750 MG tablet Take 1 tablet (750 mg total) by mouth 3 (three) times daily as needed for muscle spasms. Patient not taking: Reported on 12/11/2021 05/17/19   Traci Sermon, PA-C     Vital Signs: BP 134/71 (BP Location: Left Arm)   Pulse (!) 131   Temp 97.7 F (36.5 C) (Oral)   Resp (!) 32   Ht _0  (1.651 m)   Wt (!) 315 lb 14.7 oz (143.3 kg)   SpO2 91%   BMI 52.57 kg/m   Physical Exam Vitals reviewed.  Constitutional:      General: She is not in acute distress.    Appearance: She is obese.  Pulmonary:     Effort: Pulmonary effort is normal.  Skin:    General: Skin is warm.     Coloration: Skin is not cyanotic or jaundiced.     Comments: RUQ, RLQ, LUQ, LLQ drains in placed   RLQ - site with minimal, persistent leakage around the insertion site, flushes/aspirates well, minimal feculent/purulent fluid in the gravity bag   RUQ - site clean, dry, minimal thin clear fluid with debris in the gravity bag. Flushes well, does not aspirate much   LUQ  - site c/d, minimal thin clear fluid with debris in the gravity bag. Flushes well, does not aspirate  LLQ - site with minimal thick tan colored leakage around the insertion site, dry, feculent  fluid in the gravity bag. F/A well.     Neurological:     Mental Status: She is alert.    NAD, alert      Imaging: CT ABDOMEN PELVIS W CONTRAST  Result Date: 12/24/2021 CLINICAL DATA:  Abdominal pain postop EXAM: CT ABDOMEN AND PELVIS WITH CONTRAST TECHNIQUE: Multidetector CT imaging of the abdomen and pelvis was performed using the standard protocol following bolus administration of intravenous contrast. RADIATION DOSE REDUCTION: This exam was performed according to the departmental dose-optimization program which includes automated exposure control, adjustment of the mA and/or kV  according to patient size and/or use of iterative reconstruction technique. CONTRAST:  119m OMNIPAQUE IOHEXOL 350 MG/ML SOLN COMPARISON:  12/18/2021 FINDINGS: Lower chest: New small right pleural effusion. Dependent atelectasis/consolidation of the lung bases right greater than left. Hepatobiliary: Gallbladder is physiologically distended with at least 1 5 mm calcified stone in its dependent aspect. No focal liver lesion or biliary ductal dilatation. Pancreas: Unremarkable. No pancreatic ductal dilatation or surrounding inflammatory changes. Spleen: Normal in size without focal abnormality. Adrenals/Urinary Tract: Adrenal glands are unremarkable. Kidneys are normal, without renal calculi, focal lesion, or hydronephrosis. Bladder is unremarkable. Stomach/Bowel: Stomach is physiologically distended, unremarkable. Small bowel is decompressed with good distal passage of oral contrast  material. Appendix not identified. The colon is nondilated, unremarkable. Vascular/Lymphatic: Mild scattered aortoiliac calcified atheromatous plaque without aneurysm. No abdominal or pelvic adenopathy. Reproductive: Uterus and bilateral adnexa are unremarkable. Other: Stable left upper abdominal surgical drain terminating inferior to the left lobe of the liver. Left parasagittal surgical drain extends to the left lower quadrant as before. Right mid abdominal surgical drain extends to the anterior pelvis as before. Interval pigtail drain placement in right infrahepatic fluid collection with significant decompression, residual gas and fluid pocket 4.8 x 1.5 cm. Interval pigtail drain placement into left lower quadrant collection with significant decompression, 1.5 cm residual gas and fluid pocket. Interval pigtail drain placement into left pelvic fluid collection, with significant decompression, 1.6 cm gas and fluid pocket medial to the drain. Interval pigtail drain placement into the right pelvic gas and fluid collection, virtually  completely evacuated. Persistent 4 cm gas and fluid collection posterior to the uterus, previously 6.2 cm. There is linear gas in the left lateral abdominal wall musculature, new since previous. Musculoskeletal: Bilateral hip DJD. Bilateral sacroiliitis. Spondylitic changes throughout the lower thoracic and lumbar spine. No acute findings. IMPRESSION: 1. Interval pigtail drain placements into right infrahepatic, left lower quadrant, left pelvic, and right pelvic collections with significant decompression. 2. Persistent 4 cm gas and fluid collection posterior to the uterus, previously 6.2 cm. 3. New small right pleural effusion with bibasilar atelectasis/consolidation. 4. Cholelithiasis. Aortic Atherosclerosis (ICD10-I70.0). Electronically Signed   By: Lucrezia Europe M.D.   On: 12/24/2021 20:09    Labs:  CBC: Recent Labs    12/24/21 0412 12/25/21 0411 12/27/21 0305 12/28/21 0948  WBC 11.6* 10.4 14.0* 13.4*  HGB 10.8* 10.3* 10.7* 10.8*  HCT 33.1* 31.9* 33.9* 33.5*  PLT 657* 573* 478* 379     COAGS: No results for input(s): "INR", "APTT" in the last 8760 hours.  BMP: Recent Labs    12/25/21 0411 12/26/21 0301 12/27/21 0305 12/28/21 0948  NA 139 138 135 139  K 3.8 4.3 4.6 5.6*  CL 102 101 99 101  CO2 _0 GLUCOSE 202* 194* 219* 185*  BUN _1 CALCIUM 8.4* 8.4* 8.5* 8.9  CREATININE 0.58 0.51 0.68 0.61  GFRNONAA >60 >60 >60 >60     LIVER FUNCTION TESTS: Recent Labs    12/23/21 0923 12/24/21 0412 12/25/21 0411 12/28/21 0948  BILITOT 0.3 <0.1* 0.4 <0.1*  AST _2 34  ALT _3 ALKPHOS 89 87 74 92  PROT 5.7* 5.4* 5.3* 6.1*  ALBUMIN 1.7* 1.5* <1.5* 1.7*     Assessment and Plan: Perforated diverticulitis s/p ex lap peritoneal lavage with drain placement on 11/13, f/u CT on 11/20 showed persistent intraabdominal fluid collections  S/p RUQ, RLQ, LUQ, LLQ drain placement by Dr. Pascal Lux on 11/22   WBC 10.4>>14>>13.4 today  Cx candida albicans;  clostridium perfringens   Output  10 Fr RUQ: 0 mL x 2 days  clear liquid with debris  10 Fr LUQ 10 mL>>0 mL yesterday  clear liquid with debris  12 Fr RLQ 10 mL>> 0 mL yesterday purulent fluid  12 Fr LLQ 85 mL>> 0 mL feculent fluid   Interval imaging/drain manipulation:  11/26: CT AP with   Current examination: Flushes easily, upper drains do not aspirate much ; lower drains flushes and aspirated well   RLQ and LLQ insertion sites  with minimal thick leakage around the insertion site  Suture and stat lock in place. Dressed appropriately.  Plan: Discussed low output with Dr. Annamaria Boots, will keep the drains in but will repeat CT early next week to eval possible drain injections/removals   Continue TID flushes with 5 cc NS. Record output Q shift. Dressing changes QD or PRN if soiled.  Call IR APP or on call IR MD if difficulty flushing or sudden change in drain output.  Repeat imaging/possible drain injection once output < 10 mL/QD (excluding flush material). Consideration for drain removal if output is < 10 mL/QD (excluding flush material), pending discussion with the providing surgical service.  Discharge planning: Please contact IR APP or on call IR MD prior to patient d/c to ensure appropriate follow up plans are in place. Typically patient will follow up with IR clinic 10-14 days post d/c for repeat imaging/possible drain injection. IR scheduler will contact patient with date/time of appointment. Patient will need to flush drain QD with 5 cc NS, record output QD, dressing changes every 2-3 days or earlier if soiled.   IR will continue to follow - please call with questions or concerns.   Electronically Signed: Tera Mater, PA-C 12/28/2021, 12:10 PM   I spent a total of 15 Minutes at the the patient's bedside AND on the patient's hospital floor or unit, greater than 50% of which was counseling/coordinating care for intra-abdominal fluid collections.

## 2021-12-28 NOTE — Progress Notes (Addendum)
Peripherally Inserted Central Catheter Exchange  The IV Nurse has discussed with the patient and/or persons authorized to consent for the patient, the purpose of this procedure and the potential benefits and risks involved with this procedure.  The benefits include less needle sticks, lab draws from the catheter, and the patient may be discharged home with the catheter. Risks include, but not limited to, infection, bleeding, blood clot (thrombus formation), and puncture of an artery; nerve damage and irregular heartbeat and possibility to perform a PICC exchange if needed/ordered by physician.  Alternatives to this procedure were also discussed.  Bard Power PICC patient education guide, fact sheet on infection prevention and patient information card has been provided to patient /or left at bedside.   [place] EKG printed and placed on hard chart. PICC Placement Documentation  PICC Double Lumen 65/78/46 Right Basilic 44 cm 0.5 cm (Active)  Indication for Insertion or Continuance of Line Administration of hyperosmolar/irritating solutions (i.e. TPN, Vancomycin, etc.) 12/28/21 0310  Exposed Catheter (cm) 0.5 cm 12/28/21 0310  Site Assessment Clean, Dry, Intact 12/28/21 0310  Lumen #1 Status Flushed;Saline locked;Blood return noted 12/28/21 0310  Lumen #2 Status Flushed;Saline locked;Blood return noted 12/28/21 0310  Dressing Type Transparent;Securing device 12/28/21 0310  Dressing Status Antimicrobial disc in place;Clean, Dry, Intact 12/28/21 0310  Safety Lock Not Applicable 96/29/52 8413  Line Care Connections checked and tightened 12/28/21 0310  Line Adjustment (NICU/IV Team Only) No 12/28/21 0310  Dressing Intervention New dressing 12/28/21 0310  Dressing Change Due 01/04/22 12/28/21 0310       Mickel Baas  Hetty Linhart 12/28/2021, 4:48 PM

## 2021-12-29 DIAGNOSIS — I10 Essential (primary) hypertension: Secondary | ICD-10-CM | POA: Diagnosis not present

## 2021-12-29 DIAGNOSIS — K668 Other specified disorders of peritoneum: Secondary | ICD-10-CM | POA: Diagnosis not present

## 2021-12-29 DIAGNOSIS — F411 Generalized anxiety disorder: Secondary | ICD-10-CM | POA: Diagnosis not present

## 2021-12-29 DIAGNOSIS — K5720 Diverticulitis of large intestine with perforation and abscess without bleeding: Secondary | ICD-10-CM | POA: Diagnosis not present

## 2021-12-29 LAB — BASIC METABOLIC PANEL
Anion gap: 8 (ref 5–15)
BUN: 11 mg/dL (ref 6–20)
CO2: 30 mmol/L (ref 22–32)
Calcium: 8.5 mg/dL — ABNORMAL LOW (ref 8.9–10.3)
Chloride: 97 mmol/L — ABNORMAL LOW (ref 98–111)
Creatinine, Ser: 0.66 mg/dL (ref 0.44–1.00)
GFR, Estimated: >60 mL/min (ref >60)
Glucose, Bld: 238 mg/dL — ABNORMAL HIGH (ref 70–99)
Potassium: 4.6 mmol/L (ref 3.5–5.1)
Sodium: 135 mmol/L (ref 135–145)

## 2021-12-29 LAB — CBC
HCT: 31.3 % — ABNORMAL LOW (ref 36.0–46.0)
Hemoglobin: 8.9 g/dL — ABNORMAL LOW (ref 12.0–15.0)
MCH: 30.2 pg (ref 26.0–34.0)
MCHC: 28.4 g/dL — ABNORMAL LOW (ref 30.0–36.0)
MCV: 106.1 fL — ABNORMAL HIGH (ref 80.0–100.0)
Platelets: 331 10*3/uL (ref 150–400)
RBC: 2.95 MIL/uL — ABNORMAL LOW (ref 3.87–5.11)
RDW: 16.3 % — ABNORMAL HIGH (ref 11.5–15.5)
WBC: 8.7 10*3/uL (ref 4.0–10.5)
nRBC: 0 % (ref 0.0–0.2)

## 2021-12-29 LAB — GLUCOSE, CAPILLARY
Glucose-Capillary: 171 mg/dL — ABNORMAL HIGH (ref 70–99)
Glucose-Capillary: 191 mg/dL — ABNORMAL HIGH (ref 70–99)
Glucose-Capillary: 194 mg/dL — ABNORMAL HIGH (ref 70–99)
Glucose-Capillary: 209 mg/dL — ABNORMAL HIGH (ref 70–99)
Glucose-Capillary: 222 mg/dL — ABNORMAL HIGH (ref 70–99)

## 2021-12-29 LAB — MAGNESIUM: Magnesium: 2 mg/dL (ref 1.7–2.4)

## 2021-12-29 LAB — PHOSPHORUS: Phosphorus: 3.4 mg/dL (ref 2.5–4.6)

## 2021-12-29 MED ORDER — TRAVASOL 10 % IV SOLN
INTRAVENOUS | Status: AC
  Filled 2021-12-29: qty 972

## 2021-12-29 NOTE — Progress Notes (Signed)
PROGRESS NOTE    [name]  [name]:[id number] DOB: 1970-03-27 DOA: 12/11/2021 PCP: Martinique, Betty G, MD    Brief Narrative:  TALLYN Velasquez is a 51 years old female with past medical history obstructive sleep apnea/OHS on CPAP presented to our hospital on 12/11/2021 with abdominal pain.   CT scan of the abdomen pelvis showed free gas and fluid.  Patient was seen by general surgery and was taken emergently to the OR.  There was finding of well-formed abscess cavity in the lower mid abdomen near the sigmoid colon with purulent peritonitis and fibrinous exudate throughout the abdomen.  Findings was suspected secondary to perforated sigmoid diverticulitis.  She had laparoscopic peritoneal lavage with drains placed.  Patient was intubated postoperatively and was initially in the ICU and was subsequently was transferred to our service after successful extubation.   During hospitalization, general surgery has followed the patient.  Patient has multiple abdominal drains placed by general surgery and IR for for multiple air-fluid levels in the abdomen on CT scan.  General surgery following and has been advanced on diet.  Currently on TPN.     Assessment and Plan:  Principal Problem:   Pneumoperitoneum Active Problems:   Morbid obesity ([place])   Hyperlipidemia   Type 2 diabetes mellitus with other specified complication (HCC)   OSA on CPAP   Essential hypertension, benign   Bipolar I disorder (HCC)   Anxiety state   Chronic combined systolic and diastolic CHF (congestive heart failure) (HCC)   Colitis   Lactic acidosis   Perforation of sigmoid colon due to diverticulitis   Presumed perforated sigmoid diverticulitis with peritonitis and abscess - s/p laparascopic peritoneal lavage with drain placement. Status post IR guided multiple drain placement.  Currently on Zosyn and fluconazole.  Multiple drains in the abdomen.  General surgery following.  CT scan of the abdomen and pelvis for  reassessment 08/17/2021 with a multiple air fluid  levels.  Currently on TPN and diet has been advanced to regular.  General surgery planning to continue conservative treatment.  Hx OSA/OHS Need nocturnal CPAP.  Has not been compliant.  Emphasized the need for wearing it.  Acute kidney injury. Resolved.  Latest creatinine at 0.6.  Mild hyperkalemia.  Addressed with TPN supplementation.  Check levels in AM.  Hx chronic HFpEF Essential hypertension,  hyperlipidemia 20 Echo from Feb 2022 with improved EF 60-65%, G1DD, Lasix on hold.  Compensated at this time.  Follows up with cardiology as outpatient.   Hx Seizures, Bipolar disorder, depression. On alprazolam Lyrica and vraylar.  Hold this morning.  History of GERD.  Continue Protonix.  Morbid obesity. Body mass index is 52.57 kg/m.  Would benefit from weight loss as outpatient.   DVT prophylaxis: heparin injection 5,000 Units Start: 12/12/21 0600   Code Status:     Code Status: Full code  Disposition:  Likely home with home health as per PT recommendation when okay with surgery.  Status is: Inpatient  Remains inpatient appropriate because: Status post abdominal surgery, IV antibiotic, multiple drains, general surgery following   Family Communication:  Spoke with the patient's relative Ms. Emilie Rutter at bedside.  Consultants:  General surgery. PCCM Interventional radiology  Procedures:  Laparoscopic peritoneal lavage with drain placement on 12/11/2021. Intubation and subsequent extubation. NG tube placement and self removal CT-guided drain tube placement x 4 by IR on 12/19/2021 Dr. Pascal Lux. PICC line placement 1125  Antimicrobials:  Zosyn IV 11/13> Micafungin 11/22 - 11/24 Fluconazole 11/25>> Metronidazole 11/13 -  11/13  Subjective: Today, patient was seen and examined at bedside.  Patient denies any nausea vomiting fever chills or rigor.  Objective: Vitals:   12/29/21 0557 12/29/21 0907 12/29/21 1227  12/29/21 1227  BP:  (!) 147/92 (!) 140/94 (!) 140/94  Pulse: (!) 110 (!) 121 (!) 114 (!) 114  Resp: 18  (!) 25   Temp:  99.3 F (37.4 C) 98.4 F (36.9 C)   TempSrc:  Oral Oral   SpO2: 95% 94% 94% 94%  Weight:      Height:        Intake/Output Summary (Last 24 hours) at 12/29/2021 1319 Last data filed at 12/29/2021 8676 Gross per 24 hour  Intake 1211.45 ml  Output 20 ml  Net 1191.45 ml    Filed Weights   12/11/21 2114 12/18/21 0432  Weight: (!) 138.6 kg (!) 143.3 kg    Physical Examination: Body mass index is 52.57 kg/m.   General: Morbidly obese, mildly somnolent, not in obvious distress.  Appears chronically ill  HENT:   No scleral pallor or icterus noted. Oral mucosa is moist.  Chest:   Diminished breath sounds bilaterally.  No wheezes or crackles. CVS: S1 &S2 heard. No murmur.  Regular rate and rhythm. Abdomen:  soft abdomen with multiple abdominal drains. Extremities: No cyanosis, clubbing or edema.  Peripheral pulses are palpable. Psych: Alert, awake and Communicative, flat affect, mildly somnolent CNS:  No cranial nerve deficits.  Power equal in all extremities.   Skin: Warm and dry.  Abdominal drains with dressing.  Data Reviewed:   CBC: Recent Labs  Lab 12/24/21 0412 12/25/21 0411 12/27/21 0305 12/28/21 0948 12/29/21 0824  WBC 11.6* 10.4 14.0* 13.4* 8.7  HGB 10.8* 10.3* 10.7* 10.8* 8.9*  HCT 33.1* 31.9* 33.9* 33.5* 31.3*  MCV 93.0 93.0 94.4 92.3 106.1*  PLT 657* 573* 478* 379 331     Basic Metabolic Panel: Recent Labs  Lab 12/24/21 0412 12/25/21 0411 12/26/21 0301 12/27/21 0305 12/28/21 0948 12/29/21 0939  NA 141 139 138 135 139 135  K 3.6 3.8 4.3 4.6 5.6* 4.6  CL 100 102 101 99 101 97*  CO2 '30 28 27 28 26 30  '$ GLUCOSE 194* 202* 194* 219* 185* 238*  BUN '6 7 8 9 12 11  '$ CREATININE 0.65 0.58 0.51 0.68 0.61 0.66  CALCIUM 8.7* 8.4* 8.4* 8.5* 8.9 8.5*  MG 1.9 1.9 1.9  --  2.1 2.0  PHOS 3.5 3.4 3.2  --  3.7 3.4     Liver Function  Tests: Recent Labs  Lab 12/23/21 0923 12/24/21 0412 12/25/21 0411 12/28/21 0948  AST '25 20 19 '$ 34  ALT '14 11 12 22  '$ ALKPHOS 89 87 74 92  BILITOT 0.3 <0.1* 0.4 <0.1*  PROT 5.7* 5.4* 5.3* 6.1*  ALBUMIN 1.7* 1.5* <1.5* 1.7*      Radiology Studies: DG CHEST PORT 1 VIEW  Result Date: 12/28/2021 CLINICAL DATA:  Dyspnea EXAM: PORTABLE CHEST 1 VIEW COMPARISON:  Prior chest x-ray 05/03/2015 FINDINGS: Mild cardiomegaly. No pulmonary edema. Streaky bibasilar airspace opacities are present and are favored to reflect atelectasis. No focal airspace infiltrate. Right upper extremity PICC is present. The tip of the PICC overlies the innominate vein on the right. No pneumothorax or pleural effusion. No acute osseous abnormality. Surgical changes of prior posterior cervical fusion. IMPRESSION: 1. Right upper extremity PICC with the tip overlying the innominate vein. 2. Stable mild cardiomegaly. 3. Streaky bibasilar airspace opacities favored to reflect atelectasis. Electronically Signed   By:  Jacqulynn Cadet M.D.   On: 12/28/2021 13:10   [place] EKG Site Rite  Result Date: 12/28/2021 If Site Rite image not attached, placement could not be confirmed due to current cardiac rhythm.     LOS: 18 days    Flora Lipps, MD [facility] Available via Epic secure chat 7am-7pm After these hours, please refer to coverage provider listed on [email or web address] 12/29/2021, 1:19 PM

## 2021-12-29 NOTE — Progress Notes (Signed)
General Surgery Follow Up Note  Subjective:    No new complaints. Resting in bed. Says she felt like eating more yesterday. Denies abdominal pain  Objective:  Vital signs for last 24 hours: Temp:  [97.7 F (36.5 C)-98.8 F (37.1 C)] 98 F (36.7 C) (12/01 0556) Pulse Rate:  [105-131] 110 (12/01 0557) Resp:  [16-32] 18 (12/01 0557) BP: (124-165)/(57-133) 157/92 (12/01 0556) SpO2:  [87 %-99 %] 95 % (12/01 0557)  Hemodynamic parameters for last 24 hours:    Intake/Output from previous day: 11/30 0701 - 12/01 0700 In: 1451.5 [P.O.:360; I.V.:1041.5; IV Piggyback:50] Out: 71 [Drains:71]  Intake/Output this shift: No intake/output data recorded.  Vent settings for last 24 hours:    Physical Exam:  Gen: comfortable, no distress Neuro: non-focal exam Neck: supple Pulm: unlabored breathing Abd: soft, NT, drains in place, one feculent in LLQ and one with purulent discharge at skin level in RLQ (IR drain). Of the 2 remaining surgical drain the L abdominal drain has clear serous fluid and the R abdominal drain has cloudy serous fluid Extr: wwp, no edema    Results for orders placed or performed during the hospital encounter of 12/11/21 (from the past 24 hour(s))  Comprehensive metabolic panel     Status: Abnormal   Collection Time: 12/28/21  9:48 AM  Result Value Ref Range   Sodium 139 135 - 145 mmol/L   Potassium 5.6 (H) 3.5 - 5.1 mmol/L   Chloride 101 98 - 111 mmol/L   CO2 26 22 - 32 mmol/L   Glucose, Bld 185 (H) 70 - 99 mg/dL   BUN 12 6 - 20 mg/dL   Creatinine, Ser 0.61 0.44 - 1.00 mg/dL   Calcium 8.9 8.9 - 10.3 mg/dL   Total Protein 6.1 (L) 6.5 - 8.1 g/dL   Albumin 1.7 (L) 3.5 - 5.0 g/dL   AST 34 15 - 41 U/L   ALT 22 0 - 44 U/L   Alkaline Phosphatase 92 38 - 126 U/L   Total Bilirubin <0.1 (L) 0.3 - 1.2 mg/dL   GFR, Estimated >60 >60 mL/min   Anion gap 12 5 - 15  Magnesium     Status: None   Collection Time: 12/28/21  9:48 AM  Result Value Ref Range   Magnesium  2.1 1.7 - 2.4 mg/dL  Phosphorus     Status: None   Collection Time: 12/28/21  9:48 AM  Result Value Ref Range   Phosphorus 3.7 2.5 - 4.6 mg/dL  CBC     Status: Abnormal   Collection Time: 12/28/21  9:48 AM  Result Value Ref Range   WBC 13.4 (H) 4.0 - 10.5 K/uL   RBC 3.63 (L) 3.87 - 5.11 MIL/uL   Hemoglobin 10.8 (L) 12.0 - 15.0 g/dL   HCT 33.5 (L) 36.0 - 46.0 %   MCV 92.3 80.0 - 100.0 fL   MCH 29.8 26.0 - 34.0 pg   MCHC 32.2 30.0 - 36.0 g/dL   RDW 15.3 11.5 - 15.5 %   Platelets 379 150 - 400 K/uL   nRBC 0.0 0.0 - 0.2 %  Glucose, capillary     Status: Abnormal   Collection Time: 12/28/21 11:27 AM  Result Value Ref Range   Glucose-Capillary 159 (H) 70 - 99 mg/dL  Glucose, capillary     Status: Abnormal   Collection Time: 12/28/21  5:55 PM  Result Value Ref Range   Glucose-Capillary 161 (H) 70 - 99 mg/dL  Glucose, capillary  Status: Abnormal   Collection Time: 12/29/21 12:15 AM  Result Value Ref Range   Glucose-Capillary 194 (H) 70 - 99 mg/dL  Glucose, capillary     Status: Abnormal   Collection Time: 12/29/21  5:52 AM  Result Value Ref Range   Glucose-Capillary 191 (H) 70 - 99 mg/dL  Glucose, capillary     Status: Abnormal   Collection Time: 12/29/21  7:30 AM  Result Value Ref Range   Glucose-Capillary 209 (H) 70 - 99 mg/dL    Assessment & Plan:   Present on Admission:  Anxiety state  Bipolar I disorder (HCC)  Chronic combined systolic and diastolic CHF (congestive heart failure) (HCC)  Essential hypertension, benign  Hyperlipidemia  Morbid obesity ([place])  Type 2 diabetes mellitus with other specified complication (HCC)    LOS: 18 days    POD 18 s/p diagnostic lap washout, evacuation of abscess, and JP drain placement x2, Dr. Thermon Leyland 11/13 -CT 11/20 showed multiple loculated air and fluid collections throughout the abdomen and pelvis. CT 11/26 reviewed, now with seven drains. Continue antibiotics - this was likely perforated diverticultitis from beginning.  There is still a possibility she may need colectomy and/or colostomy, but she has reached plateau and may be able to be managed just with drains. LLQ IR drain remains feculent but low output. RLQ IR drain with purulent discharge around drain - started staged removal of surgical drains 11/30 with LUQ drain which has been serous. Removal of L abdominal surgical drain today which has been serous - WBC has normalized and abdominal exam stable. Tolerating diet. - CT planned early next work per IR to eval drains and will follow -mobilize, PT   FEN - soft, ensure VTE - sq heparin ID - currently zosyn   GNR bacteremia  HTN Morbid obesity OSA CHF Bipolar disorder HLD Seizures  Winferd Humphrey, Dr. [facility] Surgery 12/29/2021, 8:38 AM Please see Amion for pager number during day hours 7:00am-4:30pm   12/29/2021

## 2021-12-29 NOTE — Progress Notes (Signed)
Supervising Physician: Ruthann Cancer  Patient Status:  [name] - In-pt  Chief Complaint:  Perforated diverticulitis s/p ex lap peritoneal lavage with drain placement on 11/13, f/u CT on 11/20 showed persistent intraabdominal fluid collections  S/p RUQ, RLQ, LUQ, LLQ drain placement by Dr. Pascal Lux on 11/22   Subjective:  Pt lying in bed, talking on phone, tv on.  States she's "been better" but does not elaborate.  Denies abdominal pain, N/V.  Allergies: Savella [milnacipran hcl]  Medications: Prior to Admission medications   Medication Sig Start Date End Date Taking? Authorizing Provider  acetaminophen (TYLENOL) 500 MG tablet Take 1,000 mg by mouth every 6 (six) hours as needed for moderate pain.    Yes [provider]  albuterol (VENTOLIN HFA) 108 (90 Base) MCG/ACT inhaler Inhale 2 puffs into the lungs every 6 (six) hours as needed for wheezing or shortness of breath. 10/06/20  Yes Rigoberto Noel, MD  ALPRAZolam Duanne Moron) 1 MG tablet Take 1 mg by mouth 3 (three) times daily.   Yes [provider]  amphetamine-dextroamphetamine (ADDERALL) 20 MG tablet Take 20 mg by mouth in the morning, at noon, in the evening, and at bedtime.    Yes [provider]  Ascorbic Acid (VITAMIN C) 500 MG tablet Take 1,000 mg by mouth daily.   Yes [provider]  aspirin 81 MG tablet Take 1 tablet (81 mg total) by mouth daily. 05/21/19  Yes Costella, Vista Mink, PA-C  atorvastatin (LIPITOR) 20 MG tablet TAKE 1 TABLET BY MOUTH EVERY DAY IN THE EVENING Patient taking differently: Take 20 mg by mouth daily. 03/15/21  Yes Minus Breeding, MD  b complex vitamins tablet Take 1 tablet by mouth daily.   Yes [provider]  Calcium Citrate (CITRACAL PO) Take 2 tablets by mouth daily.   Yes [provider]  Cholecalciferol (VITAMIN D3) 5000 UNITS TABS Take 10,000 Units by mouth in the morning and at bedtime.   Yes [provider]  Chromium Picolinate 500 MCG TABS  Take 500 mcg by mouth daily.    Yes [provider]  diclofenac Sodium (VOLTAREN) 1 % GEL Apply 2 g topically 3 (three) times daily as needed (knee pain).   Yes [provider]  furosemide (LASIX) 40 MG tablet Take 2 tablets (80 mg total) by mouth 2 (two) times daily. Patient taking differently: Take 40-80 mg by mouth See admin instructions. Take 80 mg in the morning and 40 mg in the evening 12/05/21  Yes [name], Jeneen Rinks, MD  [name] 1.5/30 1.5-30 MG-MCG tablet Take 1 tablet by mouth at bedtime. 08/03/19  Yes [provider]  levalbuterol (XOPENEX HFA) 45 MCG/ACT inhaler Inhale 2 puffs into the lungs every 6 (six) hours as needed for wheezing. 10/14/20  Yes Rigoberto Noel, MD  lidocaine (LIDODERM) 5 % PLACE 1 PATCH ONTO THE SKIN DAILY AS NEEDED FOR KNEE PAIN. REMOVE AND DISCARD PATCH WITHIN 12 HOURS OR AS DIRECTED BY DOCTOR. Patient taking differently: Place 1 patch onto the skin as needed (knee pain). 05/22/21  Yes Martinique, Betty G, MD  losartan (COZAAR) 25 MG tablet Take 1 tablet (25 mg total) by mouth 2 (two) times daily. 12/05/21  Yes [name], Jeneen Rinks, MD  LYRICA 300 MG capsule Take 300 mg by mouth 2 (two) times daily.  03/26/11  Yes [provider]  MAGNESIUM PO Take 500 mg by mouth daily.    Yes [provider]  Multiple Vitamin (MULTIVITAMIN) tablet Take 1 tablet by mouth  daily.   Yes [provider]  NUEDEXTA 20-10 MG CAPS Take 1 capsule by mouth every 12 (twelve) hours.  09/27/16  Yes [provider]  Omega-3 Fatty Acids (FISH OIL) 1200 MG CAPS Take 2,400 mg by mouth daily.    Yes [provider]  potassium chloride SA (KLOR-CON M20) 20 MEQ tablet Take 1 tablet (20 mEq total) by mouth daily. 04/14/21  Yes [name], Jeneen Rinks, MD  VRAYLAR 6 MG CAPS Take 6 mg by mouth every evening.  09/03/16  Yes [provider]  Zinc 50 MG CAPS Take 50 mg by mouth in the morning and at bedtime.   Yes [provider]  Blood Glucose  Monitoring Suppl (ACCU-CHEK AVIVA PLUS) w/Device KIT As directed. 11/14/21   Martinique, Betty G, MD  methocarbamol (ROBAXIN-750) 750 MG tablet Take 1 tablet (750 mg total) by mouth 3 (three) times daily as needed for muscle spasms. Patient not taking: Reported on 12/11/2021 05/17/19   Traci Sermon, PA-C     Vital Signs: BP (!) 140/94   Pulse (!) 114   Temp 98.4 F (36.9 C) (Oral)   Resp (!) 25   Ht _0  (1.651 m)   Wt (!) 315 lb 14.7 oz (143.3 kg)   SpO2 94%   BMI 52.57 kg/m   Physical Exam Constitutional:      General: She is not in acute distress. HENT:     Head: Normocephalic and atraumatic.  Cardiovascular:     Rate and Rhythm: Tachycardia present.  Pulmonary:     Effort: Pulmonary effort is normal. No respiratory distress.  Abdominal:     Tenderness: There is abdominal tenderness.     Comments: RLQ - site with minimal leakage around the insertion site, flushes/aspirates well, minimal feculent/purulent fluid in the gravity bag    RUQ - site clean, dry, minimal thin clear fluid with debris in the gravity bag. Flushes well, does not aspirate much    LUQ  - site c/d, minimal thin clear fluid with debris in the gravity bag. Flushes well, does not aspirate   LLQ - site with minimal leakage around the insertion site, feculent  fluid in the gravity bag. F/A well.   Skin:    General: Skin is warm and dry.     Coloration: Skin is not cyanotic or jaundiced.  Neurological:     General: No focal deficit present.     Mental Status: She is alert.  Psychiatric:        Mood and Affect: Mood normal.        Behavior: Behavior normal.    Imaging: DG CHEST PORT 1 VIEW  Result Date: 12/28/2021 CLINICAL DATA:  Dyspnea EXAM: PORTABLE CHEST 1 VIEW COMPARISON:  Prior chest x-ray 05/03/2015 FINDINGS: Mild cardiomegaly. No pulmonary edema. Streaky bibasilar airspace opacities are present and are favored to reflect atelectasis. No focal airspace infiltrate. Right upper extremity PICC  is present. The tip of the PICC overlies the innominate vein on the right. No pneumothorax or pleural effusion. No acute osseous abnormality. Surgical changes of prior posterior cervical fusion. IMPRESSION: 1. Right upper extremity PICC with the tip overlying the innominate vein. 2. Stable mild cardiomegaly. 3. Streaky bibasilar airspace opacities favored to reflect atelectasis. Electronically Signed   By: Jacqulynn Cadet M.D.   On: 12/28/2021 13:10   [place] EKG Site Rite  Result Date: 12/28/2021 If Site Rite image not attached, placement could not be confirmed due to current cardiac rhythm.   Labs:  CBC: Recent Labs    12/25/21 0411 12/27/21 0305 12/28/21 0948 12/29/21 0824  WBC 10.4 14.0* 13.4* 8.7  HGB 10.3* 10.7* 10.8* 8.9*  HCT 31.9* 33.9* 33.5* 31.3*  PLT 573* 478* 379 331    COAGS: No results for input(s): "INR", "APTT" in the last 8760 hours.  BMP: Recent Labs    12/26/21 0301 12/27/21 0305 12/28/21 0948 12/29/21 0939  NA 138 135 139 135  K 4.3 4.6 5.6* 4.6  CL 101 99 101 97*  CO2 _0 GLUCOSE 194* 219* 185* 238*  BUN _1 CALCIUM 8.4* 8.5* 8.9 8.5*  CREATININE 0.51 0.68 0.61 0.66  GFRNONAA >60 >60 >60 >60    LIVER FUNCTION TESTS: Recent Labs    12/23/21 0923 12/24/21 0412 12/25/21 0411 12/28/21 0948  BILITOT 0.3 <0.1* 0.4 <0.1*  AST _2 34  ALT _3 ALKPHOS 89 87 74 92  PROT 5.7* 5.4* 5.3* 6.1*  ALBUMIN 1.7* 1.5* <1.5* 1.7*    Assessment and Plan:  Perforated diverticulitis s/p ex lap peritoneal lavage with drain placement on 11/13, f/u CT on 11/20 showed persistent intraabdominal fluid collections  S/p RUQ, RLQ, LUQ, LLQ drain placement by Dr. Pascal Lux on 11/22    WBC 10.4>>14>>13.4>>8.7 today  Cx candida albicans; clostridium perfringens    Output  10 Fr RUQ: 5 mL,  clear liquid with debris  10 Fr LUQ 25 mL,  clear liquid with debris  12 Fr RLQ 38m, purulent fluid  12 Fr LLQ 15cc,  feculent fluid    Interval  imaging/drain manipulation:  11/26: CT AP   Current examination: Flushes easily, upper drains do not aspirate much ; lower drains flushes and aspirated well   RLQ and LLQ insertion sites  with minimal thick leakage around the insertion site  Suture and stat lock in place. Dressed appropriately.    Plan: CT orders placed for Monday to evaluate drain progress  Discussed with RN, TID flushes with 5 cc NS. Record output Q shift, may need to re-label drains in flowsheets. Dressing changes QD or PRN if soiled.   Call IR APP or on call IR MD if difficulty flushing or sudden change in drain output.   Electronically Signed: HPasty Spillers PA 12/29/2021, 2:51 PM   I spent a total of 25 Minutes at the the patient's bedside AND on the patient's hospital floor or unit, greater than 50% of which was counseling/coordinating care for drain care.

## 2021-12-29 NOTE — Progress Notes (Signed)
PHARMACY - TOTAL PARENTERAL NUTRITION CONSULT NOTE   Indication:  abdominal abscess  Patient Measurements: Height: '5\' 5"'$  (165.1 cm) Weight: (!) 143.3 kg (315 lb 14.7 oz) IBW/kg (Calculated) : 57  Adjusted Body Weight: 91 kg - used for nutrition calculations Body mass index is 52.57 kg/m. Usual Weight: ~141 kg  Assessment:  51 yo F with PMH of HF, HTN, HLD, seizures, T2DM, anxiety, bipolar, depression, pulmonary hypertension, and obesity presented to the ED on 11/13 with abdominal pain and found to have pneumoperitoneum on CT. Pt went to OR on 11/13 and found to have perforated sigmoid diverticulitis w/ peritonitis and abscess and underwent laprascopic peritoneal lavage w/ drain placement. On 11/17, pt was gradually restarted on CLD, advanced to soft diet on 11/18. Drain output became purulent on 11/20 and CT abdomen showed multiple large intraabdominal fluid collections and pt was made NPO on 11/21. Pt underwent CT-guided placement of 4 abdominal drains with feculent, foul smelling drainage output in IR on 11/21, concerning for abdominal abscess. Pharmacy has been consulted to initiate and manage TPN to allow for perforated sigmoid to heal over.  Surgery discussing possible diverting colostomy week of 11/29. 11/28 to start 48 hour calorie count.   Glucose / Insulin: BG 159- 209,  A1c 5.9% (10/23), rSSI requiring 16 units/24h Electrolytes: Cl 97, CoCa 10.3, others wnl Renal: Scr 0.6 stable, BUN wnl Hepatic: albumin 1.7, LFTs/AlkPhos/Tbili WNL, TG 146 Intake / Output: UOP 5x documented, drains 71 ml (LLQ IR drain is clearly feculent but low output. RLQ IR drain with purulent discharge around drain); LBM: 11/28 x2  GI Imaging: 11/13 CT abd: sigmoid colitis and colonic perforation, likely ileus in mid jejunum 11/20 CT abd: multifocal intra-abdominal abscesses in the setting of perforated viscus, small volume perihepatic ascites, cholelithiasis 11/26 CT abd: Interval pigtail drain placements  with significant decompression. Persistent 4 cm gas and fluid collection posterior to the uterus, previously 6.2 cm GI Surgeries / Procedures:  11/13: laparoscopic peritoneal lavage w/ drain placement 11/21: CT-guided placement of 4 abdominal drains by IR 11/30 start staged removal of drains  Central access: 11/25 PICC placed > 11/30 retracted 10cm > replaced TPN start date: 12/23/21  Nutritional Goals: Goal TPN rate is 100 mL/hr (provides 130 g of protein and 2232 kcals per day)  RD Assessment: Estimated Needs Total Energy Estimated Needs: 2200-2400 Total Protein Estimated Needs: 115-135 grams Total Fluid Estimated Needs: >2.0 L  Current Nutrition:  TPN 11/27 Regular diet -documented 50% x2 meals 11/28 Calorie count start   Day 1: Patient ate 840 kcal and 42 g protein over 24 hours    Day 2: Patient ate 1050 kcal and 60 g protein over 24 hours   Day 3: Patient ate 720 kcal and 25 g protein over 24 hours 11/29 TPN was stopped at 2:45am due to PICC retracted, pharmacy was not notified and D10 was not initiated. No hypoglycemia. Discussed with day shift RN.   Plan:  Decrease TPN to 75 mL/hr per discussion with RD and Team, provides 97 g protein and 1674 kcal/day, meeting ~75% of daily nutritional needs.  Note: Lipids have NOT been added to TPN in setting of active fungal infection.  Electrolytes in TPN:  Increase Na 150 mEq/L, Cl:Ac 2:1; Continue (adjusted for volume) K 7 mEq/L, Ca 0 mEq/L, Mg 11 mEq/L, and Phos 16 mmol/L Add standard MVI and trace elements to TPN  Team changed to Q6 hr rSSI on 11/27, adjust as needed  Continue insulin regular 25 units in  TPN Monitor TPN labs on Mon/Thurs F/u calorie count and ability to wean TPN   Benetta Spar, PharmD, BCPS, [facility] Clinical Pharmacist  Please check AMION for all [place] phone numbers After 10:00 PM, call [place]

## 2021-12-29 NOTE — Progress Notes (Signed)
Calorie Count Note  Calorie Count Day 3. Pt continues to have a poor appetite and po intake. Doing well with liquids and ONS. EMR shows pt accepted 3 Ensure Enlive yesterday, calorie count with 1 documented. Spoke with pharmacy, plan to reduce TPN to 75% which will provide about 1674kcal, 97g protein and 1840m fluid. With documented po intake, pt will continue to meet minimum estimated nutrient needs.  Estimated Nutritional Needs:  Calories:  2200-2400kcal Protein:  115-135 grams Fluid:  >2.0 L  Diet: regular Supplements: Ensure Enlive TID  11/30: Breakfast: 1 sausage Lunch: 1 Ensure Enlive Dinner: 2 gingerale  Total intake: 720 kcal (33% of minimum estimated needs)  25 protein (22% of minimum estimated needs)  Nutrition Dx: Inadequate oral intake related to altered GI function and AMS as evidenced by pt meeting <50% calorie needs with po intake   Goal: Patient will meet greater than or equal to 90% of their needs.    Intervention: Reduce TPN to 75% estimated needs this evening Continue to monitor po intake and adjust nutrition support as clinically appropriate Continue Ensure Enlive TID Complete calorie count Encourage po intake at meals  KCandise Bowens MS, RD, LDN, CNSC See AMiON for contact information

## 2021-12-30 DIAGNOSIS — K668 Other specified disorders of peritoneum: Secondary | ICD-10-CM | POA: Diagnosis not present

## 2021-12-30 DIAGNOSIS — F411 Generalized anxiety disorder: Secondary | ICD-10-CM | POA: Diagnosis not present

## 2021-12-30 DIAGNOSIS — K5720 Diverticulitis of large intestine with perforation and abscess without bleeding: Secondary | ICD-10-CM | POA: Diagnosis not present

## 2021-12-30 DIAGNOSIS — I10 Essential (primary) hypertension: Secondary | ICD-10-CM | POA: Diagnosis not present

## 2021-12-30 LAB — GLUCOSE, CAPILLARY
Glucose-Capillary: 178 mg/dL — ABNORMAL HIGH (ref 70–99)
Glucose-Capillary: 184 mg/dL — ABNORMAL HIGH (ref 70–99)
Glucose-Capillary: 190 mg/dL — ABNORMAL HIGH (ref 70–99)
Glucose-Capillary: 199 mg/dL — ABNORMAL HIGH (ref 70–99)
Glucose-Capillary: 236 mg/dL — ABNORMAL HIGH (ref 70–99)

## 2021-12-30 MED ORDER — TRAVASOL 10 % IV SOLN: INTRAVENOUS | Status: DC

## 2021-12-30 MED ORDER — TRAVASOL 10 % IV SOLN
INTRAVENOUS | Status: DC
  Filled 2021-12-30: qty 972

## 2021-12-30 MED ORDER — TRAVASOL 10 % IV SOLN
INTRAVENOUS | Status: AC
  Filled 2021-12-30: qty 960

## 2021-12-30 NOTE — Progress Notes (Signed)
Pharmacy Antibiotic Note  [name] is a 51 y.o. female admitted on 12/11/2021 with intra-abdominal abscess s/p peritoneal lavage with drain placement.  Repeat CT scan on 11/20 showed multiple abscesses. IR placed new drains on 11/21. Pharmacy has been consulted for Zosyn dosing. Drain cultures from 11/21 identified yeast as Candida albicans.  Patient continues on fluconazole therapy per pharmacy.   Planning repeat CT scan on 12/4 to assess abscesses.   Plan: Continue Zosyn 3.375g IV q8h (4 hour infusion). Continue Fluconazole '400mg'$  IV q24h  Monitor daily CBC, temp, SCr, and for clinical signs of improvement  F/u cultures and de-escalate antibiotics as able    Height: '5\' 5"'$  (165.1 cm) Weight: (!) 140.6 kg (309 lb 14.4 oz) IBW/kg (Calculated) : 57  Temp (24hrs), Avg:98 F (36.7 C), Min:97.7 F (36.5 C), Max:98.4 F (36.9 C)  Recent Labs  Lab 12/24/21 0412 12/25/21 0411 12/26/21 0301 12/27/21 0305 12/28/21 0948 12/29/21 0824 12/29/21 0939  WBC 11.6* 10.4  --  14.0* 13.4* 8.7  --   CREATININE 0.65 0.58 0.51 0.68 0.61  --  0.66     Estimated Creatinine Clearance: 117.4 mL/min (by C-G formula based on SCr of 0.66 mg/dL).    Allergies  Allergen Reactions   Savella [Milnacipran Hcl] Other (See Comments)    mania   Antimicrobials this admission:  Cefepime/Flagyl x1 11/13 Zosyn 11/13 >>  Micafungin 11/22 >> 11/25 Fluconazole 11/26 >>   Dose adjustments this admission:   Microbiology results:  11/13 blood: 2/4 bottles Bacteroides thetaiotaomicron, beta-lactamase pos, no BCID run 11/13 peritoneal fluid: E coli (R-amp only), abundant Bacteroides thetaiotaomicron   11/13 fungus peritoneal fluid: none observed, final pending 11/13 MRSA PCR: neg 11/21 abscess drain: gram stain w/ abundant Bacteroides (not fragilis, beta-lactamase+), few Clostridium perfringens, abundant Candida albicans (final)   Benetta Spar, PharmD, BCPS, [facility] Clinical Pharmacist  Please check  AMION for all [place] phone numbers After 10:00 PM, call [place] [phone]

## 2021-12-30 NOTE — Progress Notes (Signed)
Mobility Specialist Progress Note:   12/30/21 1635  Mobility  Activity Refused mobility   Pt refused mobility d/t wanting to sleep. Will f/u as able.    Andrey Campanile Mobility Specialist Please contact via SecureChat or  Rehab office at [phone]

## 2021-12-30 NOTE — Progress Notes (Signed)
PROGRESS NOTE    [name]  HEN:[id number] DOB: 1970-11-13 DOA: 12/11/2021 PCP: [place], Betty G, MD    Brief Narrative:  [name] is a 51 years old female with past medical history obstructive sleep apnea/OHS on CPAP presented to our hospital on 12/11/2021 with abdominal pain.   CT scan of the abdomen pelvis showed free gas and fluid.  Patient was seen by general surgery and was taken emergently to the OR.  There was finding of well-formed abscess cavity in the lower mid abdomen near the sigmoid colon with purulent peritonitis and fibrinous exudate throughout the abdomen.  Findings was suspected secondary to perforated sigmoid diverticulitis.  She had laparoscopic peritoneal lavage with drains placed.  Patient was intubated postoperatively and was initially in the ICU and was subsequently was transferred to our service after successful extubation.   During hospitalization, general surgery has followed the patient.  Patient has multiple abdominal drains placed by general surgery and IR.  General surgery following and has been advanced on diet.  Currently on TPN.     Assessment and Plan:  Principal Problem:   Pneumoperitoneum Active Problems:   Morbid obesity ([place])   Hyperlipidemia   Type 2 diabetes mellitus with other specified complication (HCC)   OSA on CPAP   Essential hypertension, benign   Bipolar I disorder (HCC)   Anxiety state   Chronic combined systolic and diastolic CHF (congestive heart failure) (HCC)   Colitis   Lactic acidosis   Perforation of sigmoid colon due to diverticulitis   Presumed perforated sigmoid diverticulitis with peritonitis and abscess - s/p laparascopic peritoneal lavage with drain placement. Status post IR guided multiple drain placement.  Currently on Zosyn and fluconazole.  Multiple drains in the abdomen.  General surgery and IR following.   on TPN and diet has been advanced to regular.  General surgery planning to continue  conservative treatment.  Hx OSA/OHS Encouraged wearing CPAP.  Did better yesterday.  Acute kidney injury. Resolved.  Latest creatinine at 0.6.  Mild hyperkalemia.  Improved.  Will monitor.  Hx chronic HFpEF Essential hypertension,  hyperlipidemia 20 Echo from Feb 2022 with improved EF 60-65%, G1DD, Lasix on hold.  Compensated at this time.  Follows up with cardiology as outpatient.   Hx Seizures, Bipolar disorder, depression. On alprazolam Lyrica and vraylar.    History of GERD.  Continue Protonix.  Morbid obesity. Body mass index is 51.57 kg/m.  Would benefit from weight loss as outpatient.   DVT prophylaxis: heparin injection 5,000 Units Start: 12/12/21 0600   Code Status:     Code Status: Full code  Disposition:  Likely home with home health as per PT recommendation when okay with surgery.  Status is: Inpatient  Remains inpatient appropriate because: Status post abdominal surgery, IV antibiotics, multiple drains, general surgery following   Family Communication:  Spoke with the patient's relative Ms. Emilie Rutter at bedside again today..  Consultants:  General surgery. PCCM Interventional radiology  Procedures:  Laparoscopic peritoneal lavage with drain placement on 12/11/2021. Intubation and subsequent extubation. NG tube placement and self removal CT-guided drain tube placement x 4 by IR on 12/19/2021 Dr. Pascal Lux. PICC line placement 1125  Antimicrobials:  Zosyn IV 11/13> Micafungin 11/22 - 11/24 Fluconazole 11/25>> Metronidazole 11/13 - 11/13  Subjective: Today, patient was seen and examined at bedside.  More alert awake and Communicative.  Denies any nausea vomiting fever abdominal pain.    Objective: Vitals:   12/29/21 2053 12/30/21 0417 12/30/21 0819 12/30/21  0926  BP:  (!) 150/81  (!) 149/72  Pulse: 91 97  97  Resp:  16  16  Temp:  98.1 F (36.7 C)  97.7 F (36.5 C)  TempSrc:  Oral  Oral  SpO2: 100% 94%  94%  Weight:   (!) 140.6 kg    Height:        Intake/Output Summary (Last 24 hours) at 12/30/2021 1112 Last data filed at 12/30/2021 0800 Gross per 24 hour  Intake 1161.44 ml  Output 60 ml  Net 1101.44 ml    Filed Weights   12/11/21 2114 12/18/21 0432 12/30/21 0819  Weight: (!) 138.6 kg (!) 143.3 kg (!) 140.6 kg    Physical Examination: Body mass index is 51.57 kg/m.   General: Morbidly obese, alert awake and Communicative, not in obvious distress HENT:   No scleral pallor or icterus noted. Oral mucosa is moist.  Chest: Breath sounds bilaterally. CVS: S1 &S2 heard. No murmur.  Regular rate and rhythm. Abdomen:  soft abdomen with multiple abdominal drains. Extremities: No cyanosis, clubbing or edema.  Peripheral pulses are palpable. Psych: Alert, awake and Communicative, flat affect. CNS:  No cranial nerve deficits.  Moves all extremities  Skin: Warm and dry.  Abdominal drains with dressing.  Data Reviewed:   CBC: Recent Labs  Lab 12/24/21 0412 12/25/21 0411 12/27/21 0305 12/28/21 0948 12/29/21 0824  WBC 11.6* 10.4 14.0* 13.4* 8.7  HGB 10.8* 10.3* 10.7* 10.8* 8.9*  HCT 33.1* 31.9* 33.9* 33.5* 31.3*  MCV 93.0 93.0 94.4 92.3 106.1*  PLT 657* 573* 478* 379 331     Basic Metabolic Panel: Recent Labs  Lab 12/24/21 0412 12/25/21 0411 12/26/21 0301 12/27/21 0305 12/28/21 0948 12/29/21 0939  NA 141 139 138 135 139 135  K 3.6 3.8 4.3 4.6 5.6* 4.6  CL 100 102 101 99 101 97*  CO2 '30 28 27 28 26 30  '$ GLUCOSE 194* 202* 194* 219* 185* 238*  BUN '6 7 8 9 12 11  '$ CREATININE 0.65 0.58 0.51 0.68 0.61 0.66  CALCIUM 8.7* 8.4* 8.4* 8.5* 8.9 8.5*  MG 1.9 1.9 1.9  --  2.1 2.0  PHOS 3.5 3.4 3.2  --  3.7 3.4     Liver Function Tests: Recent Labs  Lab 12/24/21 0412 12/25/21 0411 12/28/21 0948  AST 20 19 34  ALT '11 12 22  '$ ALKPHOS 87 74 92  BILITOT <0.1* 0.4 <0.1*  PROT 5.4* 5.3* 6.1*  ALBUMIN 1.5* <1.5* 1.7*      Radiology Studies: DG CHEST PORT 1 VIEW  Result Date: 12/28/2021 CLINICAL  DATA:  Dyspnea EXAM: PORTABLE CHEST 1 VIEW COMPARISON:  Prior chest x-ray 05/03/2015 FINDINGS: Mild cardiomegaly. No pulmonary edema. Streaky bibasilar airspace opacities are present and are favored to reflect atelectasis. No focal airspace infiltrate. Right upper extremity PICC is present. The tip of the PICC overlies the innominate vein on the right. No pneumothorax or pleural effusion. No acute osseous abnormality. Surgical changes of prior posterior cervical fusion. IMPRESSION: 1. Right upper extremity PICC with the tip overlying the innominate vein. 2. Stable mild cardiomegaly. 3. Streaky bibasilar airspace opacities favored to reflect atelectasis. Electronically Signed   By: Jacqulynn Cadet M.D.   On: 12/28/2021 13:10   [place] EKG Site Rite  Result Date: 12/28/2021 If Site Rite image not attached, placement could not be confirmed due to current cardiac rhythm.     LOS: 19 days    Flora Lipps, MD [facility] Available via Epic secure chat 7am-7pm  After these hours, please refer to coverage provider listed on [email or web address] 12/30/2021, 11:12 AM

## 2021-12-30 NOTE — Progress Notes (Signed)
Patient has home unit CPAP at beside. Patient can place on and off when she is ready.

## 2021-12-30 NOTE — Progress Notes (Signed)
Nutrition Brief Note  Received page from Pharmacy regarding patient. Pharmacist reports patient has been on calorie count and inquiring about if TPN can be reduced. Noted pt is on regular diet and is ordered for Ensure Enlive TID. Per calorie count completed yesterday pt consumed 720 kcal (33% of minimum estimated needs) and 25 grams of protein (22% of estimated needs). RD recommended reducing TPN to meet 75% of needs based on calorie count results per assessment on 12/29/21. Calorie count was completed so no longer ordered for a calorie count.   Recommend continuing TPN to meet 75% of needs per RD recommendations 12/29/21 based on recent calorie count results. Patient will continue to be followed by RD during admission. Encourage PO intake at meals and continue Ensure Enlive po TID.  Loanne Drilling, MS, RD, LDN, CNSC Pager number available on Amion

## 2021-12-30 NOTE — Progress Notes (Signed)
Assessment & Plan: POD#18 - s/p diagnostic lap washout, evacuation of abscess, and JP drain placement - Dr. Thermon Leyland 11/13 - CT 11/20 showed multiple loculated air and fluid collections throughout the abdomen and pelvis. CT 11/26 reviewed, now with seven drains. Continue antibiotics - this was likely perforated diverticultitis from beginning. There is still a possibility she may need colectomy and/or colostomy, but she has reached plateau and may be able to be managed just with drains. LLQ IR drain remains feculent but low output. RLQ IR drain with purulent discharge around drain - started staged removal of surgical drains 11/30 with LUQ drain which has been serous. Removal of L abdominal surgical drain today which has been serous - WBC has normalized and abdominal exam stable. Tolerating diet. On TNA as well. - CT planned early next week per IR to eval drains and will follow - mobilize, PT - encouraged OOB, ambulation in halls, up to chair   FEN - soft, ensure, TNA - calorie counts ongoing VTE - sq heparin ID - currently zosyn   GNR bacteremia  HTN Morbid obesity OSA CHF Bipolar disorder HLD Seizures        [name], MD [facility] A [place] practice Office: [phone]        Chief Complaint: Perforated diverticulitis  Subjective: Patient in bed, comfortable.  Friend at bedside.  Objective: Vital signs in last 24 hours: Temp:  [97.8 F (36.6 C)-98.4 F (36.9 C)] 98.1 F (36.7 C) (12/02 0417) Pulse Rate:  [91-115] 97 (12/02 0417) Resp:  [16-25] 16 (12/02 0417) BP: (132-150)/(77-94) 150/81 (12/02 0417) SpO2:  [91 %-100 %] 94 % (12/02 0417) Weight:  [140.6 kg] 140.6 kg (12/02 0819) Last BM Date : 12/27/21  Intake/Output from previous day: 12/01 0701 - 12/02 0700 In: 921.4 [I.V.:911.4] Out: 60 [Drains:60] Intake/Output this shift: No intake/output data recorded.  Physical Exam: HEENT - sclerae clear, mucous membranes moist Neck -  soft Abdomen - soft, obese; non-tender; multiple perc drains with thin, brownish tan output - small   Lab Results:  Recent Labs    12/28/21 0948 12/29/21 0824  WBC 13.4* 8.7  HGB 10.8* 8.9*  HCT 33.5* 31.3*  PLT 379 331   BMET Recent Labs    12/28/21 0948 12/29/21 0939  NA 139 135  K 5.6* 4.6  CL 101 97*  CO2 26 30  GLUCOSE 185* 238*  BUN 12 11  CREATININE 0.61 0.66  CALCIUM 8.9 8.5*   PT/INR No results for input(s): "LABPROT", "INR" in the last 72 hours. Comprehensive Metabolic Panel:    Component Value Date/Time   NA 135 12/29/2021 0939   NA 139 12/28/2021 0948   K 4.6 12/29/2021 0939   K 5.6 (H) 12/28/2021 0948   CL 97 (L) 12/29/2021 0939   CL 101 12/28/2021 0948   CO2 30 12/29/2021 0939   CO2 26 12/28/2021 0948   BUN 11 12/29/2021 0939   BUN 12 12/28/2021 0948   CREATININE 0.66 12/29/2021 0939   CREATININE 0.61 12/28/2021 0948   GLUCOSE 238 (H) 12/29/2021 0939   GLUCOSE 185 (H) 12/28/2021 0948   CALCIUM 8.5 (L) 12/29/2021 0939   CALCIUM 8.9 12/28/2021 0948   AST 34 12/28/2021 0948   AST 19 12/25/2021 0411   ALT 22 12/28/2021 0948   ALT 12 12/25/2021 0411   ALKPHOS 92 12/28/2021 0948   ALKPHOS 74 12/25/2021 0411   BILITOT <0.1 (L) 12/28/2021 0948   BILITOT 0.4 12/25/2021 0411   PROT 6.1 (  L) 12/28/2021 0948   PROT 5.3 (L) 12/25/2021 0411   ALBUMIN 1.7 (L) 12/28/2021 0948   ALBUMIN <1.5 (L) 12/25/2021 0411    Studies/Results: DG CHEST PORT 1 VIEW  Result Date: 12/28/2021 CLINICAL DATA:  Dyspnea EXAM: PORTABLE CHEST 1 VIEW COMPARISON:  Prior chest x-ray 05/03/2015 FINDINGS: Mild cardiomegaly. No pulmonary edema. Streaky bibasilar airspace opacities are present and are favored to reflect atelectasis. No focal airspace infiltrate. Right upper extremity PICC is present. The tip of the PICC overlies the innominate vein on the right. No pneumothorax or pleural effusion. No acute osseous abnormality. Surgical changes of prior posterior cervical fusion.  IMPRESSION: 1. Right upper extremity PICC with the tip overlying the innominate vein. 2. Stable mild cardiomegaly. 3. Streaky bibasilar airspace opacities favored to reflect atelectasis. Electronically Signed   By: Jacqulynn Cadet M.D.   On: 12/28/2021 13:10   [place] EKG Site Rite  Result Date: 12/28/2021 If Site Rite image not attached, placement could not be confirmed due to current cardiac rhythm.     [name] 12/30/2021  Patient ID: [name], female   DOB: [birth year], 51 y.o.   MRN: [id number]

## 2021-12-30 NOTE — Progress Notes (Signed)
PHARMACY - TOTAL PARENTERAL NUTRITION CONSULT NOTE   Indication:  abdominal abscess  Patient Measurements: Height: '5\' 5"'$  (165.1 cm) Weight: (!) 140.6 kg (309 lb 14.4 oz) IBW/kg (Calculated) : 57  Adjusted Body Weight: 91 kg - used for nutrition calculations Body mass index is 51.57 kg/m. Usual Weight: ~141 kg  Assessment:  51 yo F with PMH of HF, HTN, HLD, seizures, T2DM, anxiety, bipolar, depression, pulmonary hypertension, and obesity presented to the ED on 11/13 with abdominal pain and found to have pneumoperitoneum on CT. Pt went to OR on 11/13 and found to have perforated sigmoid diverticulitis w/ peritonitis and abscess and underwent laprascopic peritoneal lavage w/ drain placement. On 11/17, pt was gradually restarted on CLD, advanced to soft diet on 11/18. Drain output became purulent on 11/20 and CT abdomen showed multiple large intraabdominal fluid collections and pt was made NPO on 11/21. Pt underwent CT-guided placement of 4 abdominal drains with feculent, foul smelling drainage output in IR on 11/21, concerning for abdominal abscess. Pharmacy has been consulted to initiate and manage TPN to allow for perforated sigmoid to heal over.  Surgery discussing possible diverting colostomy week of 11/29. 11/28 to start 48 hour calorie count.   Glucose / Insulin: BG 171- 238,  A1c 5.9% (10/23), rSSI requiring 19 units/24h Electrolytes: (last labs 12/1) Cl 97, CoCa 10.3, others wnl Renal: Scr 0.6 stable, BUN wnl Hepatic: albumin 1.7, LFTs/AlkPhos/Tbili WNL, TG 146 Intake / Output: UOP x2 documented, 5 drains 60 ml; LBM: 12/1 x1 GI Imaging: 11/13 CT abd: sigmoid colitis and colonic perforation, likely ileus in mid jejunum 11/20 CT abd: multifocal intra-abdominal abscesses in the setting of perforated viscus, small volume perihepatic ascites, cholelithiasis 11/26 CT abd: Interval pigtail drain placements with significant decompression. Persistent 4 cm gas and fluid collection posterior to  the uterus, previously 6.2 cm GI Surgeries / Procedures:  11/13: laparoscopic peritoneal lavage w/ drain placement 11/21: CT-guided placement of 4 abdominal drains by IR 11/30 start staged removal of drains  Central access: 11/25 PICC placed > 11/30 retracted 10cm > replaced TPN start date: 12/23/21  Nutritional Goals: Goal TPN rate is 100 mL/hr (provides 130 g of protein and 2232 kcals per day)  RD Assessment: Estimated Needs Total Energy Estimated Needs: 2200-2400 Total Protein Estimated Needs: 115-135 grams Total Fluid Estimated Needs: >2.0 L  Current Nutrition:  TPN 11/27 Regular diet -documented 50% x2 meals 11/28 Calorie count start   Day 1: Patient ate 840 kcal and 42 g protein over 24 hours    Day 2: Patient ate 1050 kcal and 60 g protein over 24 hours   Day 3: Patient ate 720 kcal and 25 g protein over 24 hours 11/29 TPN was stopped at 2:45am due to PICC retracted, pharmacy was not notified and D10 was not initiated. No hypoglycemia. Discussed with day shift RN.  12/1 calorie count expired. Patient's family reported patient drank 2 Ensures and 1 protein ice cream 12/2 Patient ate 2 pieces bacon, half piece toast, 1 orange juice, half ginger ale for breakfast. PO Intake limited by feeling full   Plan:  Continue 75% of goal TPN per discussion with RD Cycle TPN over 18 hours (47-94 ml/hr, GIR 1.3- 2.6 mg/kg/min), provides 96 g protein and 1635 kcal/day, meeting ~75% of daily nutritional needs.  Note: NO Lipids in TPN given active fungal infection.  Electrolytes in TPN: (adjusted for volume to match previous TPN) Continue Na 150 mEq/L, K 8 mEq/L, Ca 0 mEq/L, Mg 12 mEq/L, and  Phos 18 mmol/L, Cl:Ac 2:1 Add standard MVI and trace elements to TPN  Continue Q6 hr rSSI and increase insulin regular 38 units in TPN Monitor TPN labs 12/3 then on Mon/Thurs F/u PO intake and ability to wean TPN   Benetta Spar, PharmD, BCPS, [facility] Clinical Pharmacist  Please check AMION for all [place] phone numbers After 10:00 PM, call [place]

## 2021-12-30 NOTE — Plan of Care (Signed)

## 2021-12-31 DIAGNOSIS — F411 Generalized anxiety disorder: Secondary | ICD-10-CM | POA: Diagnosis not present

## 2021-12-31 DIAGNOSIS — K668 Other specified disorders of peritoneum: Secondary | ICD-10-CM | POA: Diagnosis not present

## 2021-12-31 DIAGNOSIS — K5720 Diverticulitis of large intestine with perforation and abscess without bleeding: Secondary | ICD-10-CM | POA: Diagnosis not present

## 2021-12-31 DIAGNOSIS — I10 Essential (primary) hypertension: Secondary | ICD-10-CM | POA: Diagnosis not present

## 2021-12-31 LAB — GLUCOSE, CAPILLARY
Glucose-Capillary: 182 mg/dL — ABNORMAL HIGH (ref 70–99)
Glucose-Capillary: 182 mg/dL — ABNORMAL HIGH (ref 70–99)
Glucose-Capillary: 194 mg/dL — ABNORMAL HIGH (ref 70–99)

## 2021-12-31 LAB — BASIC METABOLIC PANEL
Anion gap: 9 (ref 5–15)
BUN: 11 mg/dL (ref 6–20)
CO2: 30 mmol/L (ref 22–32)
Calcium: 8.6 mg/dL — ABNORMAL LOW (ref 8.9–10.3)
Chloride: 100 mmol/L (ref 98–111)
Creatinine, Ser: 0.65 mg/dL (ref 0.44–1.00)
GFR, Estimated: >60 mL/min (ref >60)
Glucose, Bld: 211 mg/dL — ABNORMAL HIGH (ref 70–99)
Potassium: 4.5 mmol/L (ref 3.5–5.1)
Sodium: 139 mmol/L (ref 135–145)

## 2021-12-31 LAB — MAGNESIUM: Magnesium: 2.2 mg/dL (ref 1.7–2.4)

## 2021-12-31 LAB — PHOSPHORUS: Phosphorus: 3.5 mg/dL (ref 2.5–4.6)

## 2021-12-31 MED ORDER — INSULIN ASPART 100 UNIT/ML IJ SOLN
0.0000 [IU] | Freq: Three times a day (TID) | INTRAMUSCULAR | Status: DC
  Administered 2021-12-31 – 2022-01-02 (×8): 4 [IU] via SUBCUTANEOUS
  Administered 2022-01-03 – 2022-01-04 (×5): 3 [IU] via SUBCUTANEOUS
  Administered 2022-01-04: 4 [IU] via SUBCUTANEOUS
  Administered 2022-01-05: 3 [IU] via SUBCUTANEOUS
  Administered 2022-01-05: 4 [IU] via SUBCUTANEOUS

## 2021-12-31 MED ORDER — ENSURE ENLIVE PO LIQD
237.0000 mL | Freq: Four times a day (QID) | ORAL | Status: DC
  Administered 2021-12-31 – 2022-01-05 (×16): 237 mL via ORAL

## 2021-12-31 MED ORDER — TRAVASOL 10 % IV SOLN
INTRAVENOUS | Status: AC
  Filled 2021-12-31: qty 605

## 2021-12-31 NOTE — Progress Notes (Signed)
NUTRITION NOTE RD working remotely.   Page received from Pharmacist. Patient last assessed by a RD (remotely) yesterday, 12/30/21, concerning PO intakes and TPN recommendations. Please see RD note from 12/30/21 at 0932 for further details.  Pharmacist shares that over the past 24 hours patient has been consuming ice cream, orange juice, bacon, eggs, and other similar items. Patient reported that she loves Ensure and has been consuming all bottles provided (currently ordered TID which provides 1050 kcal and 60 grams protein).   She is currently receiving cyclic TPN over 18 hours/day to meet 75% of estimated nutrition needs.   RD and Pharmacist discussed increasing Ensure to QID (will provide 1400 kcal and 80 grams protein) and decreasing TPN volume/rate to meet 50% estimated nutrition needs.   Estimated Nutritional Needs:  Kcal:  2200-2400 Protein:  115-135 grams Fluid:  >2.0 L      Jarome Matin, MS, RD, LDN, CNSC Clinical Dietitian PRN/Relief staff On-call/weekend pager # available in [facility]

## 2021-12-31 NOTE — Plan of Care (Signed)

## 2021-12-31 NOTE — Progress Notes (Signed)
Assessment & Plan: POD#19 - s/p diagnostic lap washout, evacuation of abscess, and JP drain placement - Dr. Thermon Leyland 11/13 - CT 11/20 showed multiple loculated air and fluid collections throughout the abdomen and pelvis. CT 11/26 reviewed, now with seven drains. Continue antibiotics - this was likely perforated diverticultitis from beginning. There is still a possibility she may need colectomy and/or colostomy, but she has reached plateau and may be able to be managed just with drains. LLQ IR drain remains feculent but low output. RLQ IR drain with purulent discharge around drain - started staged removal of surgical drains 11/30 with LUQ drain which has been serous. Removal of L abdominal surgical drain today which has been serous - WBC has normalized and abdominal exam stable. Tolerating diet. On TNA as well. - CT planned early next week per IR to eval drains and will follow - mobilize, PT - encouraged OOB, ambulation in halls, up to chair   FEN - soft, ensure, TNA - calorie counts ongoing VTE - sq heparin ID - currently zosyn   GNR bacteremia  HTN Morbid obesity OSA CHF Bipolar disorder HLD Seizures  Patient is clinically stable on current regimen.  Will need repeat CT abdomen early this week with review by IR for planning of drain management.  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        Jessica Gemma, MD [facility] Surgery A [place] practice Office: [phone]        Chief Complaint: Perforated diverticulitis  Subjective: Patient in bed, not up much.  Tolerating diet.  No complaints this AM.  Objective: Vital signs in last 24 hours: Temp:  [97.7 F (36.5 C)-99.3 F (37.4 C)] 98.3 F (36.8 C) (12/03 0923) Pulse Rate:  [96-110] 107 (12/03 0923) Resp:  [18-26] 19 (12/03 0923) BP: (126-149)/(63-99) 126/90 (12/03 0923) SpO2:  [92 %-97 %] 92 % (12/03 0923) Last BM Date : 12/30/21  Intake/Output from previous day: 12/02 0701 - 12/03 0700 In: 3512.7  [P.O.:1260; I.V.:1151.5; IV Piggyback:1081.2] Out: 45 [Drains:45] Intake/Output this shift: No intake/output data recorded.  Physical Exam: HEENT - sclerae clear, mucous membranes moist Neck - soft Abdomen - obese; three drains on right abd with small serosanguinous output; two drains on left side - left inferior drain with feculent material and moderate gas in drainage bag  Lab Results:  Recent Labs    12/29/21 0824  WBC 8.7  HGB 8.9*  HCT 31.3*  PLT 331   BMET Recent Labs    12/29/21 0939 12/31/21 0315  NA 135 139  K 4.6 4.5  CL 97* 100  CO2 30 30  GLUCOSE 238* 211*  BUN 11 11  CREATININE 0.66 0.65  CALCIUM 8.5* 8.6*   PT/INR No results for input(s): "LABPROT", "INR" in the last 72 hours. Comprehensive Metabolic Panel:    Component Value Date/Time   NA 139 12/31/2021 0315   NA 135 12/29/2021 0939   K 4.5 12/31/2021 0315   K 4.6 12/29/2021 0939   CL 100 12/31/2021 0315   CL 97 (L) 12/29/2021 0939   CO2 30 12/31/2021 0315   CO2 30 12/29/2021 0939   BUN 11 12/31/2021 0315   BUN 11 12/29/2021 0939   CREATININE 0.65 12/31/2021 0315   CREATININE 0.66 12/29/2021 0939   GLUCOSE 211 (H) 12/31/2021 0315   GLUCOSE 238 (H) 12/29/2021 0939   CALCIUM 8.6 (L) 12/31/2021 0315   CALCIUM 8.5 (L) 12/29/2021 0939   AST 34 12/28/2021 0948   AST 19 12/25/2021  0411   ALT 22 12/28/2021 0948   ALT 12 12/25/2021 0411   ALKPHOS 92 12/28/2021 0948   ALKPHOS 74 12/25/2021 0411   BILITOT <0.1 (L) 12/28/2021 0948   BILITOT 0.4 12/25/2021 0411   PROT 6.1 (L) 12/28/2021 0948   PROT 5.3 (L) 12/25/2021 0411   ALBUMIN 1.7 (L) 12/28/2021 0948   ALBUMIN <1.5 (L) 12/25/2021 0411    Studies/Results: No results found.    [name] 12/31/2021  Patient ID: [name], female   DOB: [birth year], 51 y.o.   MRN: [id number]

## 2021-12-31 NOTE — Progress Notes (Signed)
Patient has home unit CPAP at beside. Patient able to place herself on and off when ready.

## 2021-12-31 NOTE — Progress Notes (Signed)
Pt. Will have rapid HIV panel drawn due to staff needle exposure, pt. And sister in law educated and agree to testing  [employer]

## 2021-12-31 NOTE — Progress Notes (Signed)
PHARMACY - TOTAL PARENTERAL NUTRITION CONSULT NOTE   Indication:  abdominal abscess  Patient Measurements: Height: '5\' 5"'$  (165.1 cm) Weight: (!) 140.6 kg (309 lb 14.4 oz) IBW/kg (Calculated) : 57  Adjusted Body Weight: 91 kg - used for nutrition calculations Body mass index is 51.57 kg/m. Usual Weight: ~141 kg  Assessment:  51 yo F with PMH of HF, HTN, HLD, seizures, T2DM, anxiety, bipolar, depression, pulmonary hypertension, and obesity presented to the ED on 11/13 with abdominal pain and found to have pneumoperitoneum on CT. Pt went to OR on 11/13 and found to have perforated sigmoid diverticulitis w/ peritonitis and abscess and underwent laprascopic peritoneal lavage w/ drain placement. On 11/17, pt was gradually restarted on CLD, advanced to soft diet on 11/18. Drain output became purulent on 11/20 and CT abdomen showed multiple large intraabdominal fluid collections and pt was made NPO on 11/21. Pt underwent CT-guided placement of 4 abdominal drains with feculent, foul smelling drainage output in IR on 11/21, concerning for abdominal abscess. Pharmacy has been consulted to initiate and manage TPN to allow for perforated sigmoid to heal over.  Surgery discussing possible diverting colostomy week of 11/29. 11/28 to start 48 hour calorie count.   Glucose / Insulin: BG 182-236,  A1c 5.9% (10/23), rSSI requiring 19 units/24h Electrolytes:   Cl 97, CoCa 10.3, others wnl Renal: Scr 0.6 stable, BUN wnl Hepatic: albumin 1.7, LFTs/AlkPhos/Tbili WNL, TG 146 Intake / Output: UOP x3 documented, 5 drains 45 ml; LBM: 12/2 x3 GI Imaging: 11/13 CT abd: sigmoid colitis and colonic perforation, likely ileus in mid jejunum 11/20 CT abd: multifocal intra-abdominal abscesses in the setting of perforated viscus, small volume perihepatic ascites, cholelithiasis 11/26 CT abd: Interval pigtail drain placements with significant decompression. Persistent 4 cm gas and fluid collection posterior to the uterus,  previously 6.2 cm GI Surgeries / Procedures:  11/13: laparoscopic peritoneal lavage w/ drain placement 11/21: CT-guided placement of 4 abdominal drains by IR 11/30 start staged removal of drains  Central access: 11/25 PICC placed > 11/30 retracted 10cm > replaced TPN start date: 12/23/21  Nutritional Goals: Goal TPN rate is 100 mL/hr (provides 130 g of protein and 2232 kcals per day)  RD Assessment: Estimated Needs Total Energy Estimated Needs: 2200-2400 Total Protein Estimated Needs: 115-135 grams Total Fluid Estimated Needs: >2.0 L  Current Nutrition:  TPN 11/27 Regular diet -documented 50% x2 meals 11/28 Calorie count start   Day 1: Patient ate 840 kcal and 42 g protein over 24 hours    Day 2: Patient ate 1050 kcal and 60 g protein over 24 hours   Day 3: Patient ate 720 kcal and 25 g protein over 24 hours 11/29 TPN was stopped at 2:45am due to PICC retracted, pharmacy was not notified and D10 was not initiated. No hypoglycemia. Discussed with day shift RN.  12/1 calorie count expired. Patient's family reported patient drank 2 Ensures and 1 protein ice cream 12/2 ate 2 pieces bacon, half piece toast, 1 orange juice, half ginger ale for breakfast. Had 2 ensures throughout day, 1/8 hamburger, milk, orange juice, diet ginger ale, and ice cream. PO Intake limited by appetite and hospital food not appealing 12/3 Had 1 ensure and diet ginger ale as of 8:30am. Patient really likes Ensures > RD increased to TID  Plan:  Ok to decrease to 50% of goal TPN per discussion with RD Cycle TPN over 12 hours (50-100 ml/hr, GIR 1.3-2.7 mg/kg/min), provides 60 g protein and 1102 kcal/day, meeting ~50% of  daily nutritional needs.  Note: NO Lipids in TPN given active fungal infection.  Electrolytes in TPN: Decrease K 0 mEq/L,  Mg 12 mEq/L, and Phos 22 mmol/L; Continue Na 150 mEq/L, Ca 0 mEq/L,Cl:Ac 2:1 Add standard MVI and trace elements to TPN  Change rSSI to TID with meals and decrease insulin  regular to 30 units in TPN Monitor TPN labs on Mon/Thurs F/u PO intake and wean TPN   Benetta Spar, PharmD, BCPS, BCCP Clinical Pharmacist  Please check AMION for all [place] phone numbers After 10:00 PM, call [place]

## 2021-12-31 NOTE — Progress Notes (Signed)
PROGRESS NOTE    [name]  PZW:[id number] DOB: 1970-07-04 DOA: 12/11/2021 PCP: [place], Betty G, MD    Brief Narrative:  [name] is a 51 years old female with past medical history obstructive sleep apnea/OHS on CPAP presented to our hospital on 12/11/2021 with abdominal pain.   CT scan of the abdomen pelvis showed free gas and fluid.  Patient was seen by general surgery and was taken emergently to the OR.  There was finding of well-formed abscess cavity in the lower mid abdomen near the sigmoid colon with purulent peritonitis and fibrinous exudate throughout the abdomen.  Findings was suspected secondary to perforated sigmoid diverticulitis.  She had laparoscopic peritoneal lavage with drains placed.  Patient was intubated postoperatively and was initially in the ICU and was subsequently was transferred to our service after successful extubation.   During hospitalization, general surgery has followed the patient.  Patient has multiple abdominal drains placed by general surgery and IR.  General surgery following and has been advanced on diet.  Currently on TPN.     Assessment and Plan:  Principal Problem:   Pneumoperitoneum Active Problems:   Morbid obesity ([place])   Hyperlipidemia   Type 2 diabetes mellitus with other specified complication (HCC)   OSA on CPAP   Essential hypertension, benign   Bipolar I disorder (HCC)   Anxiety state   Chronic combined systolic and diastolic CHF (congestive heart failure) (HCC)   Colitis   Lactic acidosis   Perforation of sigmoid colon due to diverticulitis   Presumed perforated sigmoid diverticulitis with peritonitis and abscess - s/p laparascopic peritoneal lavage with drain placement. Status post IR guided multiple drain placement.  Currently on Zosyn and fluconazole.  Check EKG for QTc.  Multiple drains (5) in the abdomen one of them having feculent drain..  General surgery and IR following.   on TPN and diet has been  advanced to regular.  General surgery planning to continue conservative treatment.  Hx OSA/OHS Encouraged wearing CPAP.  Did better yesterday.  Acute kidney injury. Resolved.  Latest creatinine at 0.6.  Mild hyperkalemia.  Improved.  Will monitor.  Hx chronic HFpEF Essential hypertension,  hyperlipidemia 20 Echo from Feb 2022 with improved EF 60-65%, G1DD, Lasix on hold.  Compensated at this time.  Follows up with cardiology as outpatient.   Hx Seizures, Bipolar disorder, depression. On alprazolam Lyrica and vraylar.    History of GERD.  Continue Protonix.  Morbid obesity. Body mass index is 51.57 kg/m.  Would benefit from weight loss as outpatient.   DVT prophylaxis: heparin injection 5,000 Units Start: 12/12/21 0600   Code Status:     Code Status: Full code  Disposition:  Likely home with home health as per PT recommendation when okay with surgery.  Status is: Inpatient  Remains inpatient appropriate because: Status post abdominal surgery, IV antibiotics, multiple drains, general surgery following   Family Communication:  Spoke with the patient's sister-in-law Ms. Emilie Rutter on 12/30/2021  Consultants:  General surgery. PCCM Interventional radiology  Procedures:  Laparoscopic peritoneal lavage with drain placement on 12/11/2021. Intubation and subsequent extubation. NG tube placement and self removal CT-guided drain tube placement x 4 by IR on 12/19/2021 Dr. Pascal Lux. PICC line placement 1125  Antimicrobials:  Zosyn IV 11/13> Micafungin 11/22 - 11/24 Fluconazole 11/25>> Metronidazole 11/13 - 11/13  Subjective: Today, patient was seen and examined at bedside.  Denies any nausea vomiting fever chills or rigor.  No pain.  Has had a bowel movement.  Objective: Vitals:   12/30/21 2300 12/31/21 0032 12/31/21 0615 12/31/21 0923  BP:   (!) 148/99 (!) 126/90  Pulse:   (!) 110 (!) 107  Resp: (!) '26 20 20 19  '$ Temp:   97.7 F (36.5 C) 98.3 F (36.8 C)   TempSrc:   Oral Oral  SpO2:   95% 92%  Weight:      Height:        Intake/Output Summary (Last 24 hours) at 12/31/2021 1109 Last data filed at 12/31/2021 1057 Gross per 24 hour  Intake 3082.74 ml  Output 75 ml  Net 3007.74 ml    Filed Weights   12/11/21 2114 12/18/21 0432 12/30/21 0819  Weight: (!) 138.6 kg (!) 143.3 kg (!) 140.6 kg    Physical Examination: Body mass index is 51.57 kg/m.   General: Morbidly obese, alert awake and Communicative, not in obvious distress,  HENT:   No scleral pallor or icterus noted. Oral mucosa is moist.  Chest: Diminished breath sounds bilaterally.  No crackles or wheezes. CVS: S1 &S2 heard. No murmur.  Regular rate and rhythm. Abdomen: Abdomen is soft and nontender with multiple abdominal drains.  Bowel sounds heard Extremities: No cyanosis, clubbing or edema.   Psych: Alert, awake medicated, flat affect. CNS:  No cranial nerve deficits.  Able to move all extremities. Skin: Warm and dry.  Stable abdominal drains with dressing.  Data Reviewed:   CBC: Recent Labs  Lab 12/25/21 0411 12/27/21 0305 12/28/21 0948 12/29/21 0824  WBC 10.4 14.0* 13.4* 8.7  HGB 10.3* 10.7* 10.8* 8.9*  HCT 31.9* 33.9* 33.5* 31.3*  MCV 93.0 94.4 92.3 106.1*  PLT 573* 478* 379 331     Basic Metabolic Panel: Recent Labs  Lab 12/25/21 0411 12/26/21 0301 12/27/21 0305 12/28/21 0948 12/29/21 0939 12/31/21 0315  NA 139 138 135 139 135 139  K 3.8 4.3 4.6 5.6* 4.6 4.5  CL 102 101 99 101 97* 100  CO2 '28 27 28 26 30 30  '$ GLUCOSE 202* 194* 219* 185* 238* 211*  BUN '7 8 9 12 11 11  '$ CREATININE 0.58 0.51 0.68 0.61 0.66 0.65  CALCIUM 8.4* 8.4* 8.5* 8.9 8.5* 8.6*  MG 1.9 1.9  --  2.1 2.0 2.2  PHOS 3.4 3.2  --  3.7 3.4 3.5     Liver Function Tests: Recent Labs  Lab 12/25/21 0411 12/28/21 0948  AST 19 34  ALT 12 22  ALKPHOS 74 92  BILITOT 0.4 <0.1*  PROT 5.3* 6.1*  ALBUMIN <1.5* 1.7*      Radiology Studies: No results found.    LOS: 20 days     Flora Lipps, MD [facility] Available via Epic secure chat 7am-7pm After these hours, please refer to coverage provider listed on [email or web address] 12/31/2021, 11:09 AM

## 2022-01-01 ENCOUNTER — Inpatient Hospital Stay (HOSPITAL_COMMUNITY): Payer: Medicare PPO

## 2022-01-01 DIAGNOSIS — K668 Other specified disorders of peritoneum: Secondary | ICD-10-CM | POA: Diagnosis not present

## 2022-01-01 DIAGNOSIS — F411 Generalized anxiety disorder: Secondary | ICD-10-CM | POA: Diagnosis not present

## 2022-01-01 DIAGNOSIS — I10 Essential (primary) hypertension: Secondary | ICD-10-CM | POA: Diagnosis not present

## 2022-01-01 DIAGNOSIS — K5720 Diverticulitis of large intestine with perforation and abscess without bleeding: Secondary | ICD-10-CM | POA: Diagnosis not present

## 2022-01-01 LAB — COMPREHENSIVE METABOLIC PANEL
ALT: 22 U/L (ref 0–44)
AST: 29 U/L (ref 15–41)
Albumin: 1.5 g/dL — ABNORMAL LOW (ref 3.5–5.0)
Alkaline Phosphatase: 98 U/L (ref 38–126)
Anion gap: 9 (ref 5–15)
BUN: 12 mg/dL (ref 6–20)
CO2: 30 mmol/L (ref 22–32)
Calcium: 8.5 mg/dL — ABNORMAL LOW (ref 8.9–10.3)
Chloride: 99 mmol/L (ref 98–111)
Creatinine, Ser: 0.6 mg/dL (ref 0.44–1.00)
GFR, Estimated: >60 mL/min (ref >60)
Glucose, Bld: 201 mg/dL — ABNORMAL HIGH (ref 70–99)
Potassium: 4.3 mmol/L (ref 3.5–5.1)
Sodium: 138 mmol/L (ref 135–145)
Total Bilirubin: 0.2 mg/dL — ABNORMAL LOW (ref 0.3–1.2)
Total Protein: 6 g/dL — ABNORMAL LOW (ref 6.5–8.1)

## 2022-01-01 LAB — GLUCOSE, CAPILLARY
Glucose-Capillary: 151 mg/dL — ABNORMAL HIGH (ref 70–99)
Glucose-Capillary: 155 mg/dL — ABNORMAL HIGH (ref 70–99)
Glucose-Capillary: 174 mg/dL — ABNORMAL HIGH (ref 70–99)
Glucose-Capillary: 177 mg/dL — ABNORMAL HIGH (ref 70–99)

## 2022-01-01 LAB — TRIGLYCERIDES: Triglycerides: 135 mg/dL (ref <150)

## 2022-01-01 LAB — MAGNESIUM: Magnesium: 2 mg/dL (ref 1.7–2.4)

## 2022-01-01 LAB — PHOSPHORUS: Phosphorus: 4.1 mg/dL (ref 2.5–4.6)

## 2022-01-01 MED ORDER — ADULT MULTIVITAMIN W/MINERALS CH
1.0000 | ORAL_TABLET | Freq: Every day | ORAL | Status: DC
  Administered 2022-01-01 – 2022-01-05 (×5): 1 via ORAL
  Filled 2022-01-01 (×5): qty 1

## 2022-01-01 MED ORDER — IOHEXOL 350 MG/ML SOLN
75.0000 mL | Freq: Once | INTRAVENOUS | Status: AC | PRN
  Administered 2022-01-01: 75 mL via INTRAVENOUS

## 2022-01-01 MED ORDER — TRAVASOL 10 % IV SOLN
INTRAVENOUS | Status: DC
  Filled 2022-01-01: qty 605

## 2022-01-01 NOTE — Progress Notes (Signed)
Mobility Specialist: Progress Note   01/01/22 1628  Mobility  Activity Refused mobility   Pt refused mobility stating she just got back in bed after showering. Will f/u as able.   [name] Mobility Specialist Please contact via SecureChat or Rehab office at [phone]

## 2022-01-01 NOTE — Progress Notes (Signed)
PHARMACY - TOTAL PARENTERAL NUTRITION CONSULT NOTE   Indication:  abdominal abscess  Patient Measurements: Height: '5\' 5"'$  (165.1 cm) Weight: (!) 140.6 kg (309 lb 14.4 oz) IBW/kg (Calculated) : 57  Adjusted Body Weight: 91 kg - used for nutrition calculations Body mass index is 51.57 kg/m. Usual Weight: ~141 kg  Assessment:  51 yo F with PMH of HF, HTN, HLD, seizures, T2DM, anxiety, bipolar, depression, pulmonary hypertension, and obesity presented to the ED on 11/13 with abdominal pain and found to have pneumoperitoneum on CT. Pt went to OR on 11/13 and found to have perforated sigmoid diverticulitis w/ peritonitis and abscess and underwent laprascopic peritoneal lavage w/ drain placement. On 11/17, pt was gradually restarted on CLD, advanced to soft diet on 11/18. Drain output became purulent on 11/20 and CT abdomen showed multiple large intraabdominal fluid collections and pt was made NPO on 11/21. Pt underwent CT-guided placement of 4 abdominal drains with feculent, foul smelling drainage output in IR on 11/21, concerning for abdominal abscess. Pharmacy has been consulted to initiate and manage TPN to allow for perforated sigmoid to heal over.  Glucose / Insulin: BG 151-201, A1c 5.9% (10/23), rSSI requiring 12 units/24h, Regular insulin reduced to 30 units in TPN on 12/3 Electrolytes: CoCa 10.5, phos trending up 3.5 > 4.1, others wnl and stable Renal: Scr 0.6 stable, BUN wnl stable Hepatic: albumin 1.5, LFTs/AlkPhosWNL, Tbili slightly low 0.2, TG 135 Intake / Output: UOP x3 documented, 5 drains 95 ml; LBM: 12/3 x1 GI Imaging: 11/13 CT abd: sigmoid colitis and colonic perforation, likely ileus in mid jejunum 11/20 CT abd: multifocal intra-abdominal abscesses in the setting of perforated viscus, small volume perihepatic ascites, cholelithiasis 11/26 CT abd: Interval pigtail drain placements with significant decompression. Persistent 4 cm gas and fluid collection posterior to the uterus,  previously 6.2 cm GI Surgeries / Procedures:  11/13: laparoscopic peritoneal lavage w/ drain placement 11/21: CT-guided placement of 4 abdominal drains by IR 11/30 start staged removal of drains  Central access: 11/25 PICC placed > 11/30 retracted 10cm > replaced TPN start date: 12/23/21  Nutritional Goals: Goal TPN rate is 100 mL/hr (provides 130 g of protein and 2232 kcals per day)  RD Assessment: Estimated Needs Total Energy Estimated Needs: 2200-2400 Total Protein Estimated Needs: 115-135 grams Total Fluid Estimated Needs: >2.0 L  Current Nutrition:  TPN 11/27 Regular diet -documented 50% x2 meals 11/28 Calorie count start   Day 1: Patient ate 840 kcal and 42 g protein over 24 hours    Day 2: Patient ate 1050 kcal and 60 g protein over 24 hours   Day 3: Patient ate 720 kcal and 25 g protein over 24 hours 11/29 TPN was stopped at 2:45am due to PICC retracted, pharmacy was not notified and D10 was not initiated. No hypoglycemia. Discussed with day shift RN.  12/1 calorie count expired. Patient's family reported patient drank 2 Ensures and 1 protein ice cream 12/2 ate 2 pieces bacon, half piece toast, 1 orange juice, half ginger ale for breakfast. Had 2 ensures throughout day, 1/8 hamburger, milk, orange juice, diet ginger ale, and ice cream. PO Intake limited by appetite and hospital food not appealing 12/3 Had 1 ensure and diet ginger ale as of 8:30am. Patient really likes Ensures > RD increased to QID 12/4 - Patient received 3 Ensures yesterday (refused 2000 Ensure).   Plan:  Continue reduced TPN at 50% of goal per discussion with RD 12/3 Cycle TPN over 12 hours (50-100 ml/hr, GIR 1.3-2.7  mg/kg/min), provides 60 g protein and 1102 kcal/day, meeting ~50% of daily nutritional needs.  Note: NO Lipids in TPN given active fungal infection.  Electrolytes in TPN: Decrease Phos slightly to 20 mmol/L; Continue Na 150 mEq/L, K 0 mEq/L, Mg 12 mEq/L, Ca 0 mEq/L,Cl:Ac 2:1 Add standard  trace elements to TPN  Remove MVI from TPN and give PO to help promote PO intake Continue rSSI to TID with meals and insulin regular 30 units in TPN Monitor TPN labs on Mon/Thurs F/u PO intake and wean TPN   Dimple Nanas, PharmD, BCPS 01/01/2022 7:45 AM   Please check AMION for all [place] phone numbers After 10:00 PM, call [place] [phone]

## 2022-01-01 NOTE — TOC Progression Note (Signed)
Transition of Care [facility]) - Progression Note    Patient Details  Name: [name] MRN: [id number] Date of Birth: 08-Dec-1970  Transition of Care [facility]) CM/SW Contact  Tom-Johnson, Renea Ee, RN Phone Number: 01/01/2022, 3:55 PM  Clinical Narrative:     Patient continues with IV abx, multiple drains in the abdomen one of them having feculent drainage. General surgery and IR following. On TPN and Regular diet. CM continues to follow as patient progresses with care towards discharge.     Expected Discharge Plan: [place] Barriers to Discharge: Continued Medical Work up  Expected Discharge Plan and Services Expected Discharge Plan: [place]   Discharge Planning Services: CM Consult Post Acute Care Choice: [place] arrangements for the past 2 months: Single Family Home                 DME Arranged: [place] rolling DME Agency: [employer] Date DME Agency Contacted: 12/13/21 Time DME Agency Contacted: 2010 Representative spoke with at DME Agency: Jodell Cipro HH Arranged: PT, RN [place] Agency: [place] Date [place]: 12/13/21 Time [place]: [place] Representative spoke with at [place]: [place] ([place]) Interventions    Readmission Risk Interventions    05/17/2019    9:25 AM  Readmission Risk Prevention Plan  Post Dischage Appt Complete  Medication Screening Complete  Transportation Screening Complete

## 2022-01-01 NOTE — Progress Notes (Signed)
21 Days Post-Op  Subjective: CC: Denies any abdominal pain, n/v. Tolerating food. Eating 1/2 trays. Liquid BM yesterday. Voiding.   Objective: Vital signs in last 24 hours: Temp:  [97.4 F (36.3 C)-99 F (37.2 C)] 99 F (37.2 C) (12/04 0804) Pulse Rate:  [98-117] 117 (12/04 0804) Resp:  [17-19] 17 (12/04 0804) BP: (116-139)/(72-93) 139/93 (12/04 0804) SpO2:  [92 %-99 %] 94 % (12/04 0804) Last BM Date : 12/31/21  Intake/Output from previous day: 12/03 0701 - 12/04 0700 In: 2203.8 [P.O.:480; I.V.:1509.7; IV Piggyback:194.1] Out: 95 [Drains:95] Intake/Output this shift: No intake/output data recorded.  PE: Gen:  Alert, NAD, pleasant Lungs; Normal rate and effort  Abd: Obese, soft, NT, +BS. 2 surgical drains on left have been removed with dressings c/d/I.  LLQ drain with thin feculent material. Left lateral drain with scant thin fluid that is hard to characterize with as little fluid as is in bag but appears ss.  R surgical drain SS. 2 Right IR drain SS Psych: A&Ox3   Lab Results:  No results for input(s): "WBC", "HGB", "HCT", "PLT" in the last 72 hours. BMET Recent Labs    12/31/21 0315 01/01/22 0517  NA 139 138  K 4.5 4.3  CL 100 99  CO2 30 30  GLUCOSE 211* 201*  BUN 11 12  CREATININE 0.65 0.60  CALCIUM 8.6* 8.5*   PT/INR No results for input(s): "LABPROT", "INR" in the last 72 hours. CMP     Component Value Date/Time   NA 138 01/01/2022 0517   K 4.3 01/01/2022 0517   CL 99 01/01/2022 0517   CO2 30 01/01/2022 0517   GLUCOSE 201 (H) 01/01/2022 0517   BUN 12 01/01/2022 0517   CREATININE 0.60 01/01/2022 0517   CALCIUM 8.5 (L) 01/01/2022 0517   PROT 6.0 (L) 01/01/2022 0517   ALBUMIN 1.5 (L) 01/01/2022 0517   AST 29 01/01/2022 0517   ALT 22 01/01/2022 0517   ALKPHOS 98 01/01/2022 0517   BILITOT 0.2 (L) 01/01/2022 0517   GFRNONAA >60 01/01/2022 0517   GFRAA >60 05/11/2019 1411   Lipase     Component Value Date/Time   LIPASE 21 12/11/2021 1625     Studies/Results: No results found.  Anti-infectives: Anti-infectives (From admission, onward)    Start     Dose/Rate Route Frequency Ordered Stop   12/28/21 0600  amoxicillin-clavulanate (AUGMENTIN) 875-125 MG per tablet 1 tablet        1 tablet Oral  Once 12/28/21 0329 12/28/21 0637   12/25/21 0900  fluconazole (DIFLUCAN) IVPB 400 mg       See Hyperspace for full Linked Orders Report.   400 mg 100 mL/hr over 120 Minutes Intravenous Every 24 hours 12/23/21 1120     12/24/21 0900  fluconazole (DIFLUCAN) IVPB 800 mg       See Hyperspace for full Linked Orders Report.   800 mg 200 mL/hr over 120 Minutes Intravenous  Once 12/23/21 1120 12/24/21 1910   12/20/21 1000  micafungin (MYCAMINE) 150 mg in sodium chloride 0.9 % 100 mL IVPB  Status:  Discontinued        150 mg 107.5 mL/hr over 1 Hours Intravenous Every 24 hours 12/20/21 0904 12/23/21 1120   12/12/21 0600  ceFEPIme (MAXIPIME) 2 g in sodium chloride 0.9 % 100 mL IVPB  Status:  Discontinued        2 g 200 mL/hr over 30 Minutes Intravenous Every 12 hours 12/11/21 2129 12/11/21 2323   12/12/21 0600  piperacillin-tazobactam (ZOSYN) IVPB 3.375 g        3.375 g 12.5 mL/hr over 240 Minutes Intravenous Every 8 hours 12/11/21 2326     12/11/21 2330  piperacillin-tazobactam (ZOSYN) IVPB 3.375 g        3.375 g 100 mL/hr over 30 Minutes Intravenous To Post Anesthesia Care Unit 12/11/21 2326 12/12/21 0041   12/11/21 1630  metroNIDAZOLE (FLAGYL) IVPB 500 mg        500 mg 100 mL/hr over 60 Minutes Intravenous  Once 12/11/21 1619 12/11/21 2049   12/11/21 1630  ceFEPIme (MAXIPIME) 2 g in sodium chloride 0.9 % 100 mL IVPB        2 g 200 mL/hr over 30 Minutes Intravenous  Once 12/11/21 1623 12/11/21 1817        Assessment/Plan POD#20 - s/p diagnostic lap washout, evacuation of abscess, and JP drain placement x3 - Dr. Thermon Leyland 11/13 - CT 11/20 showed multiple loculated air and fluid collections throughout the abdomen and pelvis. S/p  IR drain x 4 by 11/21 (7 drains total, 3 surgical, 4 IR) - Continue antibiotics - this was likely perforated diverticultitis from beginning. There is still a possibility she may need colectomy and/or colostomy, but she has reached plateau and may be able to be managed just with drains. WBC has normalized and abdominal exam stable.  - Started staged removal of surgical drains 11/30 with LUQ drain which has been serous. Removal of L abdominal surgical drain 12/1 which had been serous - LLQ IR drain remains feculent but low output. Remaining drain ouputs reassuring.  - CT planned for today by IR - mobilize, PT - encouraged OOB, ambulation in halls, up to chair   FEN - Soft, ensure QID, TNA to support nutrition (see RD note from 12/2) VTE - SCDs, sq heparin ID - currently on zosyn   Bacteroides Thetaiotaomicron bacteremia  HTN Morbid obesity OSA CHF Bipolar disorder HLD Seizures   LOS: 21 days    Jillyn Ledger , [facility] 01/01/2022, 9:11 AM Please see Amion for pager number during day hours 7:00am-4:30pm

## 2022-01-01 NOTE — Progress Notes (Signed)
Physical Therapy Treatment Patient Details Name: [name] MRN: [id number] DOB: 1970-11-27 Today's Date: 01/01/2022   History of Present Illness 51 y.o. female presents to [facility] hospital on 12/11/2021 with abdominal pain, found to have perforated sigmoid diverticulitis. Pt underwent laparoscopy with washout and drain placement on 11/13. Pt extubated 11/14. PICC placed 82/42/35 Right Basilic. PMH includes OSA, systolic HF, anxiety, OA, asthma, bipolar disorder, depression.    PT Comments    Patient making slow progress. Self-limiting, willing to perform minimum activity with therapy and often refusing OOB activities with mobility techs. Educated further on importance of mobilizing out of bed, walking with staff when they offer. Pt tolerated bed level exercises, short distance walk to door at min guard level for safety. Patient will continue to benefit from skilled physical therapy services to further improve independence with functional mobility. Goals updated, still making slow progress.   Recommendations for follow up therapy are one component of a multi-disciplinary discharge planning process, led by the attending physician.  Recommendations may be updated based on patient status, additional functional criteria and insurance authorization.  Follow Up Recommendations  Home health PT     Assistance Recommended at Discharge Intermittent Supervision/Assistance  Patient can return home with the following A little help with walking and/or transfers;A little help with bathing/dressing/bathroom;Assistance with cooking/housework;Assist for transportation;Help with stairs or ramp for entrance   Equipment Recommendations  Rolling walker (2 wheels)    Recommendations for Other Services       Precautions / Restrictions Precautions Precautions: Fall Precaution Comments: Drains and a  PICC line Restrictions Weight Bearing Restrictions: No     Mobility  Bed Mobility Overal bed  mobility: Needs Assistance Bed Mobility: Supine to Sit, Sit to Supine     Supine to sit: Min assist Sit to supine: Min assist   General bed mobility comments: min assist to rise to EOB, cues to pushing through RUE and use rail as able. Min assist for LE support back into bed.    Transfers Overall transfer level: Needs assistance Equipment used: Rolling walker (2 wheels) Transfers: Sit to/from Stand Sit to Stand: Min assist           General transfer comment: Min assist for boost from bed with cues for hand placement.    Ambulation/Gait Ambulation/Gait assistance: Min guard Gait Distance (Feet): 18 Feet Assistive device: Rolling walker (2 wheels) Gait Pattern/deviations: Step-through pattern, Decreased stride length, Wide base of support Gait velocity: decreased Gait velocity interpretation: <1.31 ft/sec, indicative of household ambulator   General Gait Details: Min guard with improved RW control. Declines ambulating further distance today due to fatigue. HR to 130 while ambulating, moderate DOE. No LOB while using RW for support, assist with lines/drains   Stairs             Wheelchair Mobility    Modified Rankin (Stroke Patients Only)       Balance Overall balance assessment: Needs assistance Sitting-balance support: No upper extremity supported, Feet supported Sitting balance-Leahy Scale: Good     Standing balance support: No upper extremity supported, During functional activity Standing balance-Leahy Scale: Fair                              Cognition Arousal/Alertness: Awake/alert Behavior During Therapy: WFL for tasks assessed/performed Overall Cognitive Status: Impaired/Different from baseline Area of Impairment: Problem solving  Problem Solving: Slow processing, Requires verbal cues General Comments: pt is self limiting        Exercises General Exercises - Lower Extremity Ankle  Circles/Pumps: AAROM, 15 reps, Supine Quad Sets: 10 reps, Strengthening, Both, Supine Gluteal Sets: 10 reps, Strengthening, Both, Supine Hip ABduction/ADduction: Strengthening, Both, 5 reps, Supine Straight Leg Raises: Strengthening, Both, 10 reps, Supine    General Comments General comments (skin integrity, edema, etc.): HR 120-130 during session      Pertinent Vitals/Pain Pain Assessment Pain Assessment: Faces Faces Pain Scale: Hurts little more Pain Location: abdomen Pain Descriptors / Indicators: Sore Pain Intervention(s): Monitored during session, Repositioned    Home Living                          Prior Function            PT Goals (current goals can now be found in the care plan section) Acute Rehab PT Goals Patient Stated Goal: be independent again PT Goal Formulation: With patient Time For Goal Achievement: 01/15/22 Potential to Achieve Goals: Good Progress towards PT goals: Progressing toward goals    Frequency    Min 3X/week      PT Plan Current plan remains appropriate    Co-evaluation              AM-PAC PT "6 Clicks" Mobility   Outcome Measure  Help needed turning from your back to your side while in a flat bed without using bedrails?: A Little Help needed moving from lying on your back to sitting on the side of a flat bed without using bedrails?: A Little Help needed moving to and from a bed to a chair (including a wheelchair)?: A Little Help needed standing up from a chair using your arms (e.g., wheelchair or bedside chair)?: A Little Help needed to walk in hospital room?: A Little Help needed climbing 3-5 steps with a railing? : A Lot 6 Click Score: 17    End of Session Equipment Utilized During Treatment: Gait belt Activity Tolerance: Other (comment) (Self limiting) Patient left: with call bell/phone within reach;in bed;with bed alarm set   PT Visit Diagnosis: Other abnormalities of gait and mobility (R26.89);Muscle  weakness (generalized) (M62.81);Pain Pain - part of body:  (abdomen)     Time: 4010-2725 PT Time Calculation (min) (ACUTE ONLY): 20 min  Charges:  $Gait Training: 8-22 mins                     Candie Mile, PT, DPT Physical Therapist Acute Rehabilitation Services [place]    Ellouise Newer 01/01/2022, 10:09 AM

## 2022-01-01 NOTE — Progress Notes (Addendum)
PROGRESS NOTE    [name]  XTG:[id number] DOB: 1970/08/24 DOA: 12/11/2021 PCP: Martin[place], Betty G, MD    Brief Narrative:  [name] is a 51 years old female with past medical history obstructive sleep apnea/OHS on CPAP presented to our hospital on 12/11/2021 with abdominal pain.   CT scan of the abdomen pelvis showed free gas and fluid.  Patient was seen by general surgery and was taken emergently to the OR.  There was finding of well-formed abscess cavity in the lower mid abdomen near the sigmoid colon with purulent peritonitis and fibrinous exudate throughout the abdomen.  Findings was suspected secondary to perforated sigmoid diverticulitis.  She had laparoscopic peritoneal lavage with drains placed.  Patient was intubated postoperatively and was initially in the ICU and was subsequently was transferred to our service after successful extubation.   During hospitalization, general surgery has followed the patient.  Patient has multiple abdominal drains placed by general surgery and IR.  General surgery following and has been advanced on diet.  Currently on TPN.     Assessment and Plan:  Principal Problem:   Pneumoperitoneum Active Problems:   Morbid obesity ([place])   Hyperlipidemia   Type 2 diabetes mellitus with other specified complication (HCC)   OSA on CPAP   Essential hypertension, benign   Bipolar I disorder (HCC)   Anxiety state   Chronic combined systolic and diastolic CHF (congestive heart failure) (HCC)   Colitis   Lactic acidosis   Perforation of sigmoid colon due to diverticulitis   Presumed perforated sigmoid diverticulitis with peritonitis and abscess - s/p laparascopic peritoneal lavage with drain placement. Status post IR guided multiple drain placement.  Currently on Zosyn and fluconazole.  EKG without any prolonged QTc.  Multiple drains  in the abdomen one of them having feculent drain..  General surgery and IR following.  On TPN and regular diet.   General surgery planning to continue conservative treatment.  Plan for repeat CT scan of the abdomen today as per general surgery.  Hx OSA/OHS Encouraged wearing CPAP.  Did better yesterday.  Acute kidney injury. Resolved.  Latest creatinine at 0.6.  Mild hyperkalemia.  Improved.  Will monitor.  Hx chronic HFpEF Essential hypertension,  hyperlipidemia 20 Echo from Feb 2022 with improved EF 60-65%, G1DD, Lasix on hold.  Compensated at this time.  Follows up with cardiology as outpatient.   Hx Seizures, Bipolar disorder, depression. On alprazolam Lyrica and vraylar.    History of GERD.  Continue Protonix.  Morbid obesity. Body mass index is 51.57 kg/m.  Would benefit from weight loss as outpatient.   DVT prophylaxis: heparin injection 5,000 Units Start: 12/12/21 0600   Code Status:     Code Status: Full code  Disposition:  Likely home with home health as per PT recommendation when okay with surgery.  Status is: Inpatient  Remains inpatient appropriate because: Status post abdominal surgery, IV antibiotics, multiple drains, general surgery following   Family Communication:  Spoke with the patient's sister-in-law Ms. Emilie Rutter on 12/30/2021  Consultants:  General surgery. PCCM Interventional radiology  Procedures:  Laparoscopic peritoneal lavage with drain placement on 12/11/2021. Intubation and subsequent extubation. NG tube placement and self removal CT-guided drain tube placement x 4 by IR on 12/19/2021 Dr. Pascal Lux. PICC line placement 11/25  Antimicrobials:  Zosyn IV 11/13> Micafungin 11/22 - 11/24 Fluconazole 11/25>> Metronidazole 11/13 - 11/13  Subjective: Today, patient was seen and examined at bedside denies any nausea vomiting abdominal pain fever chills  or shortness of breath.  States that she has been eating better.  Had a bowel movement.    Objective: Vitals:   12/31/21 1500 12/31/21 2116 01/01/22 0554 01/01/22 0804  BP: 131/72 116/87 (!)  134/93 (!) 139/93  Pulse: 98 99 (!) 116 (!) 117  Resp: '18 18 18 17  '$ Temp: 98.5 F (36.9 C) (!) 97.4 F (36.3 C) 98 F (36.7 C) 99 F (37.2 C)  TempSrc: Oral Oral Oral   SpO2: 99% 94% 95% 94%  Weight:      Height:        Intake/Output Summary (Last 24 hours) at 01/01/2022 1118 Last data filed at 01/01/2022 0630 Gross per 24 hour  Intake 2183.83 ml  Output 20 ml  Net 2163.83 ml    Filed Weights   12/11/21 2114 12/18/21 0432 12/30/21 0819  Weight: (!) 138.6 kg (!) 143.3 kg (!) 140.6 kg    Physical Examination: Body mass index is 51.57 kg/m.   General: Alert awake and Communicative not in obvious distress,   HENT:   No scleral pallor or icterus noted. Oral mucosa is moist.  Chest: Diminished breath sounds bilaterally.   CVS: S1 &S2 heard. No murmur.  Regular rate and rhythm. Abdomen: Soft and distended, obese, multiple drains. Extremities: No cyanosis, clubbing or edema.   Psych: Alert, awake Communicative, flat affect. CNS:  No cranial nerve deficits.  Nonfocal. Skin: Warm and dry.   abdominal drains with dressing.  Data Reviewed:   CBC: Recent Labs  Lab 12/27/21 0305 12/28/21 0948 12/29/21 0824  WBC 14.0* 13.4* 8.7  HGB 10.7* 10.8* 8.9*  HCT 33.9* 33.5* 31.3*  MCV 94.4 92.3 106.1*  PLT 478* 379 331     Basic Metabolic Panel: Recent Labs  Lab 12/26/21 0301 12/27/21 0305 12/28/21 0948 12/29/21 0939 12/31/21 0315 01/01/22 0517  NA 138 135 139 135 139 138  K 4.3 4.6 5.6* 4.6 4.5 4.3  CL 101 99 101 97* 100 99  CO2 '27 28 26 30 30 30  '$ GLUCOSE 194* 219* 185* 238* 211* 201*  BUN '8 9 12 11 11 12  '$ CREATININE 0.51 0.68 0.61 0.66 0.65 0.60  CALCIUM 8.4* 8.5* 8.9 8.5* 8.6* 8.5*  MG 1.9  --  2.1 2.0 2.2 2.0  PHOS 3.2  --  3.7 3.4 3.5 4.1     Liver Function Tests: Recent Labs  Lab 12/28/21 0948 01/01/22 0517  AST 34 29  ALT 22 22  ALKPHOS 92 98  BILITOT <0.1* 0.2*  PROT 6.1* 6.0*  ALBUMIN 1.7* 1.5*      Radiology Studies: No results  found.    LOS: 21 days    Flora Lipps, MD [facility] Available via Epic secure chat 7am-7pm After these hours, please refer to coverage provider listed on [email or web address] 01/01/2022, 11:18 AM

## 2022-01-01 NOTE — Progress Notes (Signed)
   01/01/22 0554  Assess: MEWS Score  Temp 98 F (36.7 C)  BP (!) 134/93  MAP (mmHg) 105  Pulse Rate (!) 116  Resp 18  SpO2 95 %  O2 Device CPAP  Assess: MEWS Score  MEWS Temp 0  MEWS Systolic 0  MEWS Pulse 2  MEWS RR 0  MEWS LOC 0  MEWS Score 2  MEWS Score Color Yellow  Assess: if the MEWS score is Yellow or Red  Were vital signs taken at a resting state? Yes  Focused Assessment No change from prior assessment  Does the patient meet 2 or more of the SIRS criteria? Yes  Does the patient have a confirmed or suspected source of infection? Yes  Provider and Rapid Response Notified? No  MEWS guidelines implemented *See Row Information* Yes  Treat  MEWS Interventions Escalated (See documentation below)  Take Vital Signs  Increase Vital Sign Frequency  Yellow: Q 2hr X 2 then Q 4hr X 2, if remains yellow, continue Q 4hrs  Escalate  MEWS: Escalate Yellow: discuss with charge nurse/RN and consider discussing with provider and RRT  Notify: Charge Nurse/RN  Name of Charge Nurse/RN Notified Maree Erie, RN  Date Charge Nurse/RN Notified 01/01/22  Time Charge Nurse/RN Notified 0615  Assess: SIRS CRITERIA  SIRS Temperature  0  SIRS Pulse 1  SIRS Respirations  0  SIRS WBC 1  SIRS Score Sum  2

## 2022-01-02 ENCOUNTER — Ambulatory Visit (INDEPENDENT_AMBULATORY_CARE_PROVIDER_SITE_OTHER): Payer: Medicare PPO | Admitting: Psychology

## 2022-01-02 DIAGNOSIS — I5042 Chronic combined systolic (congestive) and diastolic (congestive) heart failure: Secondary | ICD-10-CM | POA: Diagnosis not present

## 2022-01-02 DIAGNOSIS — F319 Bipolar disorder, unspecified: Secondary | ICD-10-CM | POA: Diagnosis not present

## 2022-01-02 DIAGNOSIS — F4321 Adjustment disorder with depressed mood: Secondary | ICD-10-CM | POA: Diagnosis not present

## 2022-01-02 DIAGNOSIS — K668 Other specified disorders of peritoneum: Secondary | ICD-10-CM | POA: Diagnosis not present

## 2022-01-02 DIAGNOSIS — F411 Generalized anxiety disorder: Secondary | ICD-10-CM | POA: Diagnosis not present

## 2022-01-02 LAB — BASIC METABOLIC PANEL
Anion gap: 7 (ref 5–15)
BUN: 13 mg/dL (ref 6–20)
CO2: 31 mmol/L (ref 22–32)
Calcium: 8.5 mg/dL — ABNORMAL LOW (ref 8.9–10.3)
Chloride: 100 mmol/L (ref 98–111)
Creatinine, Ser: 0.6 mg/dL (ref 0.44–1.00)
GFR, Estimated: >60 mL/min (ref >60)
Glucose, Bld: 177 mg/dL — ABNORMAL HIGH (ref 70–99)
Potassium: 4.3 mmol/L (ref 3.5–5.1)
Sodium: 138 mmol/L (ref 135–145)

## 2022-01-02 LAB — GLUCOSE, CAPILLARY
Glucose-Capillary: 166 mg/dL — ABNORMAL HIGH (ref 70–99)
Glucose-Capillary: 163 mg/dL — ABNORMAL HIGH (ref 70–99)
Glucose-Capillary: 188 mg/dL — ABNORMAL HIGH (ref 70–99)

## 2022-01-02 LAB — CBC
HCT: 30.4 % — ABNORMAL LOW (ref 36.0–46.0)
Hemoglobin: 9.4 g/dL — ABNORMAL LOW (ref 12.0–15.0)
MCH: 29 pg (ref 26.0–34.0)
MCHC: 30.9 g/dL (ref 30.0–36.0)
MCV: 93.8 fL (ref 80.0–100.0)
Platelets: 383 10*3/uL (ref 150–400)
RBC: 3.24 MIL/uL — ABNORMAL LOW (ref 3.87–5.11)
RDW: 15.5 % (ref 11.5–15.5)
WBC: 8.9 10*3/uL (ref 4.0–10.5)
nRBC: 0 % (ref 0.0–0.2)

## 2022-01-02 LAB — PHOSPHORUS: Phosphorus: 4.2 mg/dL (ref 2.5–4.6)

## 2022-01-02 MED ORDER — INSULIN GLARGINE-YFGN 100 UNIT/ML ~~LOC~~ SOLN
15.0000 [IU] | Freq: Every day | SUBCUTANEOUS | Status: DC
  Administered 2022-01-02 – 2022-01-04 (×3): 15 [IU] via SUBCUTANEOUS
  Filled 2022-01-02 (×4): qty 0.15

## 2022-01-02 NOTE — Progress Notes (Signed)
Nutrition Follow-up  DOCUMENTATION CODES:   Morbid obesity  INTERVENTION:   - Continue Ensure Enlive po QID, each supplement provides 350 kcal and 20 grams of protein  - Encourage PO intake  NUTRITION DIAGNOSIS:   Inadequate oral intake related to altered GI function as evidenced by other (clear liquid diet order).  Progressing, pt now on Regular diet  GOAL:   Patient will meet greater than or equal to 90% of their needs  Progressing  MONITOR:   PO intake, Supplement acceptance, Labs, Weight trends, I & O's  REASON FOR ASSESSMENT:   Consult New TPN/TNA  ASSESSMENT:   51 year old female who presented to the ED on 11/13 with abdominal pain. PMH of CHF, agoraphobia, anxiety, bipolar disorder, depression, HLD, HTN, IBS, seizures, OSA, obesity hypoventilation. Pt admitted with abscess cavity in the lower mid abdomen near the sigmoid colon with purulent peritonitis and fibrinous exudate throughout the abdomen suspected to be secondary to perforated sigmoid diverticulitis.  11/13 - s/p laparoscopic peritoneal lavage and evacuation of abscess with drain placement x 3 11/17 - clear liquid diet 11/18 - GI soft diet 11/21 - NPO, s/p placement of 12 Fr drain into dominant abdominal fluid collection in R lower abdomen/pelvis, s/p placement of 12 Fr drain into dominant abdominal fluid collection in L lower abdomen/pelvis, s/p placement of 10 Fr drain into fluid collection in R mid abdomen, s/p placement of 10 Fr drain into fluid collection in L mid abdomen, later advanced to regular diet 11/22 - NPO, later advanced to regular diet 11/25 - NPO, later advanced to clear liquid diet, TPN start 11/26 - TPN at goal 11/27 - regular diet 12/01 - TPN decreased to 75% 12/03 - TPN decreased to 50% 12/05 - TPN discontinued  Pt sleeping soundly at time of RD visit and did not awaken to RD voice. Noted plan for TPN to be d/c today. Pt's PO intake has improved and pt taking most Ensure  supplements. CT abdomen/pelvis yesterday showing overall stability vs improvement. Per Surgery note, pt nearing discharge.  Admit weight: 138.6 kg Current weight: 140.6 kg  Medications reviewed and include: adderall, Ensure Enlive QID, SSI, semglee 15 units daily, MVI with minerals, protonix, psyllium, IV diflucan, IV abx  Labs reviewed: hemoglobin 9.4 CBG's: 151-194 x 24 hours  LUQ drain: 10 ml x 24 hours 19 Fr R abd JP drain: 10 ml x 24 hours LLQ anterior drain: 30 ml x 24 hours RUQ drain: 10 ml x 24 hours RLQ anterior drain: 20 ml x 24 hours  Diet Order:   Diet Order             Diet regular Room service appropriate? Yes; Fluid consistency: Thin  Diet effective now                   EDUCATION NEEDS:   Education needs have been addressed  Skin:  Skin Assessment: Reviewed RN Assessment (abd incision)  Last BM:  12/31/21  Height:   Ht Readings from Last 1 Encounters:  12/13/21 '5\' 5"'$  (1.651 m)    Weight:   Wt Readings from Last 1 Encounters:  12/30/21 (!) 140.6 kg    Ideal Body Weight:  56.8 kg  BMI:  Body mass index is 51.57 kg/m.  Estimated Nutritional Needs:   Kcal:  2200-2400  Protein:  115-135 grams  Fluid:  >2.0 L    Gustavus Bryant, MS, RD, LDN Inpatient Clinical Dietitian Please see AMiON for contact information.

## 2022-01-02 NOTE — Progress Notes (Signed)
TZIREL LEONOR is a 51 y.o. female patient   Treatment Plan: Date: 01/02/2022  Diagnosis 296.80 (Unspecified bipolar or related disorder) [n/a]  Symptoms Depressed or irritable mood. (Status: maintained) -- No Description Entered  Diminished interest in or enjoyment of activities. (Status: maintained) -- No Description Entered  Lack of energy. (Status: maintained) -- No Description Entered  Low self-esteem. (Status: maintained) -- No Description Entered  Medication Status compliance  Safety none  If Suicidal or Homicidal State Action Taken: unspecified  Current Risk: low Medications Adderall (Dosage: '20mg'$  4X/day)  Klonapin (Dosage: '1mg'$  3X/day)  Lamictal (Dosage: '200mg'$  2x/day)  Nuedexta (Dosage: '20mg'$ )  Vyralar (Dosage: '6mg'$ )  Xanax (Dosage: '1mg'$  3X/day)  Objectives Related Problem: Develop healthy cognitive patterns and beliefs about self and the world that lead to alleviation and help prevent the relapse of mood episodes. Description: State no longer having thoughts of self-harm. Target Date: 2022-01-07 Frequency: Daily Modality: individual Progress: 80%  Related Problem: Develop healthy cognitive patterns and beliefs about self and the world that lead to alleviation and help prevent the relapse of mood episodes. Description: Verbalize any history of past and present suicidal thoughts and actions. Target Date: 2023-01-08 Frequency: Daily Modality: individual Progress: 80%  Related Problem: Develop healthy cognitive patterns and beliefs about self and the world that lead to alleviation and help prevent the relapse of mood episodes. Description: Take prescribed medications as directed. Target Date: 2023-01-08 Frequency: Daily Modality: individual Progress: 90%  Related Problem: Develop healthy cognitive patterns and beliefs about self and the world that lead to alleviation and help prevent the relapse of mood episodes. Description: Discuss and resolve troubling  personal and interpersonal issues. Target Date: 2023-01-08 Frequency: Daily Modality: individual Progress: 24% Resolve complicated grief       Target Date: 12-24       Progress: 20%  Client Response full compliance  Service Location Location, [name] [place], [place] [address] -virtual Service Code cpt [id number]  Emotion regulation skills  Related past to present  Rationally challenge thoughts or beliefs/cognitive restructuring  Identify/label emotions  Validate/empathize  Self care activities  Lifestyle change (exercise, nutrition)  Self-monitoring  Facilitate problem solving  Session notes:  f31.9 Bipolar Unspecified.  Goals: Mood stabilization, improved marital relationship, improve self esteem and elimination of self harm thoughts/tendencies. Also, patient struggles with leaving home comfortably and seeks to improve this ability. Target date for these goals is January, 2023. In addition, she needs to improve self care behaviors due to her weight and various medical conditions. Target date for this goal is Feb., 2022. Date revised to December, 2023 to allow time for behavioral implementation. Reports periodic outbursts of rage/anger. Seeks to minimize. Target date is December, 2023. Patient has realized improvement in all areas. Requests additional treatment to facilitate maintenance of progress. Goal date 12-24. Patient's husband died unexpectedly and she is suffering complicated grief. She needs additional counseling to address associated issues and manage feelings of loss, despair and anxiety. Goal date 12-24.  Meds: Vyralar ('6mg'$ ),Nuedexta (40+'20mg'$ /day), Adderall ('20mg'$  4X/day), Xanax ('1mg'$ , 3X/day), and Klonopin ('1mg'$  3X/day).  Patient has requested a video session. She is at home and I am in my home office.  Bitha is still in the hospital (over 1 month) and it is still undetermined if she will need a colostomy. May not know for another 6 weeks. She states that Guerry Minors  (sister in law) comes to visit every day. Onetta shared very little, as she appeared tired and disconnected. She is frustrated  and wants desperately to return home. We talked about the need for her to try and keep her mind active and focus on health. We also talked about the need to address her grief over the loss of [name], but that we will mostly focus on that when she is out of the hospital and recovered from this illness.                                             [name]                                                                                                                                                                                        [name], PhD  Time:2:10p-3:00p 50 minutes.                           n

## 2022-01-02 NOTE — Progress Notes (Signed)
Mobility Specialist Progress Note:   01/02/22 1515  Mobility  Activity Ambulated with assistance in room;Ambulated with assistance to bathroom  Level of Assistance Contact guard assist, steadying assist  Assistive Device Front wheel walker  Distance Ambulated (ft) 40 ft  Activity Response Tolerated well  Mobility Referral Yes  $Mobility charge 1 Mobility   Pt agreeable to mobility session. Required min guard during ambulation with RW. SpO2 88-90% throughout. Pt in chair at EOS, encouraged IS use.   Nelta Numbers Mobility Specialist Please contact via SecureChat or  Rehab office at [phone]

## 2022-01-02 NOTE — Progress Notes (Signed)
PROGRESS NOTE    [name]  ZMO:[id number] DOB: [birth year] DOA: 12/11/2021 PCP: Martinique, Betty G, MD    Brief Narrative:  [name] is a 51 years old female with past medical history obstructive sleep apnea/OHS on CPAP presented to our hospital on 12/11/2021 with abdominal pain.   CT scan of the abdomen pelvis showed free gas and fluid.  Patient was seen by general surgery and was taken emergently to the OR.  There was finding of well-formed abscess cavity in the lower mid abdomen near the sigmoid colon with purulent peritonitis and fibrinous exudate throughout the abdomen.  Findings was suspected secondary to perforated sigmoid diverticulitis.  She had laparoscopic peritoneal lavage with drains placed.  Patient was intubated postoperatively and was initially in the ICU and was subsequently was transferred to our service after successful extubation.   During hospitalization, general surgery has followed the patient.  Patient has multiple abdominal drains placed by general surgery and IR.  General surgery following and has been advanced on diet.  Patient was on Zosyn and fluconazole during hospitalization and the decision has been to discontinue 01/22/2022.  TPN will be discontinued today as well.  IR to assist patient to see if further drains can be removed.  Disposition plan as per general surgery with Home health.   Assessment and Plan:  Principal Problem:   Pneumoperitoneum Active Problems:   Morbid obesity ([place])   Hyperlipidemia   Type 2 diabetes mellitus with other specified complication (HCC)   OSA on CPAP   Essential hypertension, benign   Bipolar I disorder (HCC)   Anxiety state   Chronic combined systolic and diastolic CHF (congestive heart failure) (HCC)   Colitis   Lactic acidosis   Perforation of sigmoid colon due to diverticulitis   Presumed perforated sigmoid diverticulitis with peritonitis and abscess - s/p laparascopic peritoneal lavage with drain  placement. Status post IR guided multiple drain placement.  Currently on Zosyn and fluconazole.  Plan is to discontinue antibiotic today as CT evidence of improvement in the abdominal collections.  Communicated with general surgery about it.. CT scan of the abdomen on 01/01/2022 showed improvement in the abdominal collection.  IR is supposed to look at it today to see if the further drains could be removed.  Follow general surgery recommendation.  Hx OSA/OHS Encouraged wearing CPAP.    Acute kidney injury. Resolved.  Latest creatinine at 0.6.  Mild hyperkalemia.  Improved.  Latest potassium of 4.3  Hx chronic HFpEF Essential hypertension,  hyperlipidemia 20 Echo from Feb 2022 with improved EF 60-65%, G1DD, Lasix on hold.  Compensated at this time.  Follows up with cardiology as outpatient.   Hx Seizures, Bipolar disorder, depression. On alprazolam Lyrica and vraylar.    History of GERD.  Continue Protonix.  Morbid obesity. Body mass index is 51.57 kg/m.  Would benefit from weight loss as outpatient.   DVT prophylaxis: heparin injection 5,000 Units Start: 12/12/21 0600   Code Status:     Code Status: Full code  Disposition:  Likely home with home health as per PT recommendation when okay with surgery.  Status is: Inpatient  Remains inpatient appropriate because: Status post abdominal surgery,  multiple drains, general surgery following   Family Communication:  Spoke with the patient's sister-in-law Ms. Emilie Rutter on 01/02/2022  Consultants:  General surgery. PCCM Interventional radiology  Procedures:  Laparoscopic peritoneal lavage with drain placement on 12/11/2021. Intubation and subsequent extubation. NG tube placement and self removal CT-guided drain tube  placement x 4 by IR on 12/19/2021 Dr. Pascal Lux. PICC line placement 11/25  Antimicrobials:  Zosyn IV 11/13> Micafungin 11/22 - 11/24 Fluconazole 11/25>> Metronidazole 11/13 - 11/13  Subjective: Today,  patient was seen and examined at bedside.  Denies any nausea vomiting fever chills abdominal pain.    Objective: Vitals:   01/01/22 1718 01/01/22 2109 01/02/22 0346 01/02/22 0825  BP: (!) 140/85 138/77 (!) 145/67 137/75  Pulse: (!) 113 97 95 (!) 107  Resp: '20 19 18 18  '$ Temp: 98.7 F (37.1 C) 98.5 F (36.9 C) 98 F (36.7 C) 98.3 F (36.8 C)  TempSrc: Oral   Oral  SpO2: 97% 95% 92% 90%  Weight:      Height:        Intake/Output Summary (Last 24 hours) at 01/02/2022 1143 Last data filed at 01/02/2022 1000 Gross per 24 hour  Intake 1614.44 ml  Output 80 ml  Net 1534.44 ml    Filed Weights   12/11/21 2114 12/18/21 0432 12/30/21 0819  Weight: (!) 138.6 kg (!) 143.3 kg (!) 140.6 kg    Physical Examination: Body mass index is 51.57 kg/m.   General: Alert awake and Communicative, not in obvious distress, morbidly obese HENT:   No scleral pallor or icterus noted. Oral mucosa is moist.  Chest: Diminished breath sounds bilaterally.  No wheezes or crackles.   CVS: S1 &S2 heard. No murmur.  Regular rate and rhythm. Abdomen: Soft and nondistended, obese abdomen, multiple drains. Extremities: No cyanosis, clubbing or edema.   Psych: Alert, awake, flat affect, Communicative CNS:  No cranial nerve deficits.  Nonfocal. Skin: Warm and dry.  Abdominal drains with dressing.  Data Reviewed:   CBC: Recent Labs  Lab 12/27/21 0305 12/28/21 0948 12/29/21 0824 01/02/22 0530  WBC 14.0* 13.4* 8.7 8.9  HGB 10.7* 10.8* 8.9* 9.4*  HCT 33.9* 33.5* 31.3* 30.4*  MCV 94.4 92.3 106.1* 93.8  PLT 478* 379 331 383     Basic Metabolic Panel: Recent Labs  Lab 12/28/21 0948 12/29/21 0939 12/31/21 0315 01/01/22 0517 01/02/22 0530  NA 139 135 139 138 138  K 5.6* 4.6 4.5 4.3 4.3  CL 101 97* 100 99 100  CO2 '26 30 30 30 31  '$ GLUCOSE 185* 238* 211* 201* 177*  BUN '12 11 11 12 13  '$ CREATININE 0.61 0.66 0.65 0.60 0.60  CALCIUM 8.9 8.5* 8.6* 8.5* 8.5*  MG 2.1 2.0 2.2 2.0  --   PHOS 3.7 3.4 3.5  4.1 4.2     Liver Function Tests: Recent Labs  Lab 12/28/21 0948 01/01/22 0517  AST 34 29  ALT 22 22  ALKPHOS 92 98  BILITOT <0.1* 0.2*  PROT 6.1* 6.0*  ALBUMIN 1.7* 1.5*      Radiology Studies: CT ABDOMEN PELVIS W CONTRAST  Result Date: 01/01/2022 CLINICAL DATA:  Follow-up intra-abdominal abscess EXAM: CT ABDOMEN AND PELVIS WITH CONTRAST TECHNIQUE: Multidetector CT imaging of the abdomen and pelvis was performed using the standard protocol following bolus administration of intravenous contrast. RADIATION DOSE REDUCTION: This exam was performed according to the departmental dose-optimization program which includes automated exposure control, adjustment of the mA and/or kV according to patient size and/or use of iterative reconstruction technique. CONTRAST:  49m OMNIPAQUE IOHEXOL 350 MG/ML SOLN COMPARISON:  CT AP, 01/23/2022 and 12/19/2021.  IR CT, 12/19/2021. FINDINGS: Lower chest: Small volume RIGHT pleural effusion with adjacent dependent atelectasis. LEFT basilar pulmonary cysts and minimal atelectasis. Hepatobiliary: No focal liver abnormality. Single sub-1 cm dependent gallstone at the  gallbladder neck. Nondistended gallbladder. No gallbladder wall thickening, or biliary dilatation. Pancreas: No pancreatic ductal dilatation or surrounding inflammatory changes. Spleen: No splenic injury or perisplenic hematoma. Adrenals/Urinary Tract: Adrenal glands are unremarkable. Sub-1 cm RIGHT superior renal pole renal cortical hypodense lesion, too small to adequately characterize though likely a small cyst. No follow-up indicated. Kidneys are otherwise normal, without renal calculi or hydronephrosis. Bladder is unremarkable. Stomach/Bowel: Stomach is within normal limits. Appendix is not definitively visualized. No evidence of bowel wall thickening, distention, or inflammatory changes. Vascular/Lymphatic: Moderate burden of aortic atherosclerosis. No aneurysmal dilatation. No enlarged abdominal or  pelvic lymph nodes. Reproductive: Uterus and adnexa are unremarkable. Other: *Obese with rotund abdomen.  No abdominal wall hernia. *Mild body wall edema. Anterior abdominal wall contusions, likely surgical port and additional injection sites. *Layering air along the LEFT lateral abdominal wall fascia and additional tracking gas along a catheter, likely secondary to flushes. *Mild omental thickening without discrete nodularity, likely edema. *Trace layering fluid along the pericolic gutters and dependent pelvis. No abdominopelvic ascites. *Minimal interval decrease in size of air-and-fluid containing retrouterine/deep pelvic abscess, measuring 3.5 cm (previously 4.0 cm) in greatest dimension. *Well-positioned RIGHT lower quadrant percutaneous drainage catheter with resolution of previously demonstrated abscess collection. *Well-positioned LEFT lower quadrant/pelvic percutaneous drainage catheter with resolution of previously demonstrated abscess collection. *Well-positioned RIGHT upper quadrant/inferior perihepatic percutaneous drainage catheter with marked decreased size of previously-demonstrated abscess, now measuring 4.4 x 2.3 cm (previously 6.8 x 3.5 cm). *Well-positioned LEFT upper quadrant percutaneous drainage catheter with resolution of previously-demonstrated abscess collection. *Stable positioning of RIGHT anterolateral approach surgically placed Blake drain, coursing along the RIGHT pericolic gutter and tip within the pelvis anterior to urinary bladder. *Interval removal of additional LEFT anterolateral approach surgically placed Blake drain. Musculoskeletal: No acute osseous findings. IMPRESSION: Since CT AP dated 12/24/2021; 1. Well-positioned percutaneous drainage catheters, as described in detail above, with resolution or marked decrease in size of intra-abdominal abscesses. 2. Persistent though interval decrease in size of now 3.5 cm deep pelvic abscess. 3. Small volume RIGHT pleural effusion. 4.  Cholelithiasis. No additional CT findings to suggest acute cholecystitis. Additional incidental, chronic and senescent findings as above. Electronically Signed   By: Michaelle Birks M.D.   On: 01/01/2022 13:41      LOS: 22 days    Flora Lipps, MD [facility] Available via Epic secure chat 7am-7pm After these hours, please refer to coverage provider listed on [email or web address] 01/02/2022, 11:43 AM

## 2022-01-02 NOTE — Progress Notes (Signed)
Home unit CPAP

## 2022-01-02 NOTE — H&P (Signed)
HPI:  The patient has had a H&P performed within the last 30 days, all history, medications, and exam have been reviewed. The patient denies any interval changes since the H&P. Request received for possible drain removals prior to discharge.  Medications: Prior to Admission medications   Medication Sig Start Date End Date Taking? Authorizing Provider  acetaminophen (TYLENOL) 500 MG tablet Take 1,000 mg by mouth every 6 (six) hours as needed for moderate pain.    Yes [provider]  albuterol (VENTOLIN HFA) 108 (90 Base) MCG/ACT inhaler Inhale 2 puffs into the lungs every 6 (six) hours as needed for wheezing or shortness of breath. 10/06/20  Yes Rigoberto Noel, MD  ALPRAZolam Duanne Moron) 1 MG tablet Take 1 mg by mouth 3 (three) times daily.   Yes [provider]  amphetamine-dextroamphetamine (ADDERALL) 20 MG tablet Take 20 mg by mouth in the morning, at noon, in the evening, and at bedtime.    Yes [provider]  Ascorbic Acid (VITAMIN C) 500 MG tablet Take 1,000 mg by mouth daily.   Yes [provider]  aspirin 81 MG tablet Take 1 tablet (81 mg total) by mouth daily. 05/21/19  Yes Costella, Vista Mink, PA-C  atorvastatin (LIPITOR) 20 MG tablet TAKE 1 TABLET BY MOUTH EVERY DAY IN THE EVENING Patient taking differently: Take 20 mg by mouth daily. 03/15/21  Yes Minus Breeding, MD  b complex vitamins tablet Take 1 tablet by mouth daily.   Yes [provider]  Calcium Citrate (CITRACAL PO) Take 2 tablets by mouth daily.   Yes [provider]  Cholecalciferol (VITAMIN D3) 5000 UNITS TABS Take 10,000 Units by mouth in the morning and at bedtime.   Yes [provider]  Chromium Picolinate 500 MCG TABS Take 500 mcg by mouth daily.    Yes [provider]  diclofenac Sodium (VOLTAREN) 1 % GEL Apply 2 g topically 3 (three) times daily as needed (knee pain).   Yes [provider]  furosemide (LASIX) 40 MG tablet Take 2 tablets (80  mg total) by mouth 2 (two) times daily. Patient taking differently: Take 40-80 mg by mouth See admin instructions. Take 80 mg in the morning and 40 mg in the evening 12/05/21  Yes [name], Jeneen Rinks, MD  [name] 1.5/30 1.5-30 MG-MCG tablet Take 1 tablet by mouth at bedtime. 08/03/19  Yes [provider]  levalbuterol (XOPENEX HFA) 45 MCG/ACT inhaler Inhale 2 puffs into the lungs every 6 (six) hours as needed for wheezing. 10/14/20  Yes Rigoberto Noel, MD  lidocaine (LIDODERM) 5 % PLACE 1 PATCH ONTO THE SKIN DAILY AS NEEDED FOR KNEE PAIN. REMOVE AND DISCARD PATCH WITHIN 12 HOURS OR AS DIRECTED BY DOCTOR. Patient taking differently: Place 1 patch onto the skin as needed (knee pain). 05/22/21  Yes Martinique, Betty G, MD  losartan (COZAAR) 25 MG tablet Take 1 tablet (25 mg total) by mouth 2 (two) times daily. 12/05/21  Yes [name], Jeneen Rinks, MD  LYRICA 300 MG capsule Take 300 mg by mouth 2 (two) times daily.  03/26/11  Yes [provider]  MAGNESIUM PO Take 500 mg by mouth daily.    Yes [provider]  Multiple Vitamin (MULTIVITAMIN) tablet Take 1 tablet by mouth daily.   Yes [provider]  NUEDEXTA 20-10 MG CAPS Take 1 capsule by mouth every 12 (twelve) hours.  09/27/16  Yes [provider]  Omega-3 Fatty Acids (FISH OIL) 1200 MG CAPS Take 2,400 mg by mouth daily.  Yes [provider]  potassium chloride SA (KLOR-CON M20) 20 MEQ tablet Take 1 tablet (20 mEq total) by mouth daily. 04/14/21  Yes [name], Jeneen Rinks, MD  VRAYLAR 6 MG CAPS Take 6 mg by mouth every evening.  09/03/16  Yes [provider]  Zinc 50 MG CAPS Take 50 mg by mouth in the morning and at bedtime.   Yes [provider]  Blood Glucose Monitoring Suppl (ACCU-CHEK AVIVA PLUS) w/Device KIT As directed. 11/14/21   Martinique, Betty G, MD  methocarbamol (ROBAXIN-750) 750 MG tablet Take 1 tablet (750 mg total) by mouth 3 (three) times daily as needed for muscle spasms. Patient not taking:  Reported on 12/11/2021 05/17/19   Traci Sermon, PA-C     Vital Signs: BP 116/82 (BP Location: Left Arm)   Pulse (!) 110   Temp 98 F (36.7 C) (Oral)   Resp 18   Ht _0  (1.651 m)   Wt (!) 309 lb 14.4 oz (140.6 kg)   SpO2 91%   BMI 51.57 kg/m   Physical Exam Vitals reviewed.  Constitutional:      General: She is not in acute distress.    Appearance: She is obese.  Cardiovascular:     Rate and Rhythm: Normal rate and regular rhythm.     Pulses: Normal pulses.     Heart sounds: Normal heart sounds.  Pulmonary:     Effort: Pulmonary effort is normal.     Breath sounds: Normal breath sounds.  Abdominal:     Palpations: Abdomen is soft.  Skin:    General: Skin is warm and dry.     Comments: RUQ, RLQ, LUQ, LLQ IR drains in place. Right anterolateral surgical drain in place. All IR drains have serous output except for LLQ drain, which has feculent output   Neurological:     Mental Status: She is alert and oriented to person, place, and time.  Psychiatric:        Mood and Affect: Mood normal.        Behavior: Behavior normal.     Mallampati Score:  MD Evaluation Airway: WNL Heart: WNL Abdomen: WNL Chest/ Lungs: WNL ASA  Classification: 3 Mallampati/Airway Score: Two  Labs:  CBC: Recent Labs    12/27/21 0305 12/28/21 0948 12/29/21 0824 01/02/22 0530  WBC 14.0* 13.4* 8.7 8.9  HGB 10.7* 10.8* 8.9* 9.4*  HCT 33.9* 33.5* 31.3* 30.4*  PLT 478* 379 331 383    COAGS: No results for input(s): "INR", "APTT" in the last 8760 hours.  BMP: Recent Labs    12/29/21 0939 12/31/21 0315 01/01/22 0517 01/02/22 0530  NA 135 139 138 138  K 4.6 4.5 4.3 4.3  CL 97* 100 99 100  CO2 _1 GLUCOSE 238* 211* 201* 177*  BUN _2 CALCIUM 8.5* 8.6* 8.5* 8.5*  CREATININE 0.66 0.65 0.60 0.60  GFRNONAA >60 >60 >60 >60    LIVER FUNCTION TESTS: Recent Labs    12/24/21 0412 12/25/21 0411 12/28/21 0948 01/01/22 0517  BILITOT <0.1* 0.4 <0.1* 0.2*   AST 20 19 34 29  ALT _3 ALKPHOS 87 74 92 98  PROT 5.4* 5.3* 6.1* 6.0*  ALBUMIN 1.5* <1.5* 1.7* 1.5*    Assessment/Plan:  Plan for patient to be brought to IR on 12/6 for drain injection with possible removal and possible drain repositioning. The case was reviewed and approved by Dr Anselm Pancoast, who felt that the LLQ drain  may need to be repositioned, but that drain injections for all drains were required before making decision to remove or reposition drains. The patient has been seen and consented for drain injections with possible removal and possible image-guided repositioning.  Risks and benefits discussed with the patient including bleeding, infection, damage to adjacent structures, bowel perforation/fistula connection, and sepsis.  All of the patient's questions were answered, patient is agreeable to proceed. Consent signed and in IR suite.   Signed: Lura Em PA-C 01/02/2022, 4:33 PM

## 2022-01-02 NOTE — Progress Notes (Addendum)
PHARMACY - TOTAL PARENTERAL NUTRITION CONSULT NOTE   Indication:  abdominal abscess  Patient Measurements: Height: '5\' 5"'$  (165.1 cm) Weight: (!) 140.6 kg (309 lb 14.4 oz) IBW/kg (Calculated) : 57  Adjusted Body Weight: 91 kg - used for nutrition calculations Body mass index is 51.57 kg/m. Usual Weight: ~141 kg  Assessment:  51 yo F with PMH of HF, HTN, HLD, seizures, T2DM, anxiety, bipolar, depression, pulmonary hypertension, and obesity presented to the ED on 11/13 with abdominal pain and found to have pneumoperitoneum on CT. Pt went to OR on 11/13 and found to have perforated sigmoid diverticulitis w/ peritonitis and abscess and underwent laprascopic peritoneal lavage w/ drain placement. On 11/17, pt was gradually restarted on CLD, advanced to soft diet on 11/18. Drain output became purulent on 11/20 and CT abdomen showed multiple large intraabdominal fluid collections and pt was made NPO on 11/21. Pt underwent CT-guided placement of 4 abdominal drains with feculent, foul smelling drainage output in IR on 11/21, concerning for abdominal abscess. Pharmacy has been consulted to initiate and manage TPN to allow for perforated sigmoid to heal over.  Glucose / Insulin: BG 151-177, A1c 5.9% (10/23), rSSI requiring 16 units/24h, Regular insulin 30 units in TPN Electrolytes: CoCa 10.5, other lytes WNL/stable Renal: Scr 0.6 stable, BUN wnl stable Hepatic: 12/3: albumin 1.5, LFTs/AlkPhosWNL, Tbili slightly low 0.2, TG 135 Intake / Output: Ensure x 4 (477 mL total intake) - UOP x2 documented, 5 drains 80 ml; LBM: 12/4 x2 GI Imaging: 11/13 CT abd: sigmoid colitis and colonic perforation, likely ileus in mid jejunum 11/20 CT abd: multifocal intra-abdominal abscesses in the setting of perforated viscus, small volume perihepatic ascites, cholelithiasis 11/26 CT abd: Interval pigtail drain placements with significant decompression. Persistent 4 cm gas and fluid collection posterior to the uterus,  previously 6.2 cm GI Surgeries / Procedures:  11/13: laparoscopic peritoneal lavage w/ drain placement 11/21: CT-guided placement of 4 abdominal drains by IR 11/30 start staged removal of drains  Central access: 11/25 PICC placed > 11/30 retracted 10cm > replaced TPN start date: 12/23/21  Nutritional Goals: Goal TPN rate is 100 mL/hr (provides 130 g of protein and 2232 kcals per day)  RD Assessment: Estimated Needs Total Energy Estimated Needs: 2200-2400 Total Protein Estimated Needs: 115-135 grams Total Fluid Estimated Needs: >2.0 L  Current Nutrition:  TPN 11/27 Regular diet -documented 50% x2 meals 11/28 Calorie count start   Day 1: Patient ate 840 kcal and 42 g protein over 24 hours    Day 2: Patient ate 1050 kcal and 60 g protein over 24 hours   Day 3: Patient ate 720 kcal and 25 g protein over 24 hours 11/29 TPN was stopped at 2:45am due to PICC retracted, pharmacy was not notified and D10 was not initiated. No hypoglycemia. Discussed with day shift RN.  12/1 calorie count expired. Patient's family reported patient drank 2 Ensures and 1 protein ice cream 12/2 ate 2 pieces bacon, half piece toast, 1 orange juice, half ginger ale for breakfast. Had 2 ensures throughout day, 1/8 hamburger, milk, orange juice, diet ginger ale, and ice cream. PO Intake limited by appetite and hospital food not appealing 12/3 Had 1 ensure and diet ginger ale as of 8:30am. Patient really likes Ensures > RD increased to QID 12/4 - Patient received 3 Ensures yesterday (refused 2000 Ensure).  12/5 - eating ~half her meal tray, accepted all 4 Ensures yesterday (~180g CHO from Ensure)  Plan:  D/w CCS - ok to discontinue TPN  today D/c TPN consult and associated labs/orders Continue rSSI TID w/ meals - still requiring some SSI w/ meals throughout the day Transition long acting insulin to SubQ - d/w MD and will reduce to Semglee 15 units SubQ q24 hours (half of TPN requirements since no longer receiving  dextrose content from TPN) - Further titration of long acting insulin at discretion of MD  Dimple Nanas, PharmD, BCPS 01/02/2022 7:42 AM   Please check AMION for all [place] phone numbers After 10:00 PM, call [place] [phone]

## 2022-01-02 NOTE — Progress Notes (Signed)
22 Days Post-Op  Subjective: Denies any abdominal pain, n/v. Tolerating food. Eating 1/2 trays.   Objective: Vital signs in last 24 hours: Temp:  [98 F (36.7 C)-98.7 F (37.1 C)] 98.3 F (36.8 C) (12/05 0825) Pulse Rate:  [95-113] 107 (12/05 0825) Resp:  [17-20] 18 (12/05 0825) BP: (137-145)/(67-85) 137/75 (12/05 0825) SpO2:  [90 %-97 %] 90 % (12/05 0825) Last BM Date : 12/31/21  Intake/Output from previous day: 12/04 0701 - 12/05 0700 In: 1473.7 [P.O.:477; I.V.:783.3; IV Piggyback:193.5] Out: 80 [Drains:80] Intake/Output this shift: No intake/output data recorded.  PE: Gen:  Alert, NAD, pleasant Lungs; Normal rate and effort  Abd: Obese, soft, NT, +BS. 2 surgical drains on left have been removed with dressings c/d/I.  LLQ drain with thin feculent material. Left lateral drain with scant thin fluid that appears mostly serous R surgical drain serous. 2 Right IR drain serous Psych: A&Ox3   Lab Results:  Recent Labs    01/02/22 0530  WBC 8.9  HGB 9.4*  HCT 30.4*  PLT 383   BMET Recent Labs    01/01/22 0517 01/02/22 0530  NA 138 138  K 4.3 4.3  CL 99 100  CO2 30 31  GLUCOSE 201* 177*  BUN 12 13  CREATININE 0.60 0.60  CALCIUM 8.5* 8.5*   PT/INR No results for input(s): "LABPROT", "INR" in the last 72 hours. CMP     Component Value Date/Time   NA 138 01/02/2022 0530   K 4.3 01/02/2022 0530   CL 100 01/02/2022 0530   CO2 31 01/02/2022 0530   GLUCOSE 177 (H) 01/02/2022 0530   BUN 13 01/02/2022 0530   CREATININE 0.60 01/02/2022 0530   CALCIUM 8.5 (L) 01/02/2022 0530   PROT 6.0 (L) 01/01/2022 0517   ALBUMIN 1.5 (L) 01/01/2022 0517   AST 29 01/01/2022 0517   ALT 22 01/01/2022 0517   ALKPHOS 98 01/01/2022 0517   BILITOT 0.2 (L) 01/01/2022 0517   GFRNONAA >60 01/02/2022 0530   GFRAA >60 05/11/2019 1411   Lipase     Component Value Date/Time   LIPASE 21 12/11/2021 1625    Studies/Results: CT ABDOMEN PELVIS W CONTRAST  Result Date:  01/01/2022 CLINICAL DATA:  Follow-up intra-abdominal abscess EXAM: CT ABDOMEN AND PELVIS WITH CONTRAST TECHNIQUE: Multidetector CT imaging of the abdomen and pelvis was performed using the standard protocol following bolus administration of intravenous contrast. RADIATION DOSE REDUCTION: This exam was performed according to the departmental dose-optimization program which includes automated exposure control, adjustment of the mA and/or kV according to patient size and/or use of iterative reconstruction technique. CONTRAST:  35m OMNIPAQUE IOHEXOL 350 MG/ML SOLN COMPARISON:  CT AP, 01/23/2022 and 12/19/2021.  IR CT, 12/19/2021. FINDINGS: Lower chest: Small volume RIGHT pleural effusion with adjacent dependent atelectasis. LEFT basilar pulmonary cysts and minimal atelectasis. Hepatobiliary: No focal liver abnormality. Single sub-1 cm dependent gallstone at the gallbladder neck. Nondistended gallbladder. No gallbladder wall thickening, or biliary dilatation. Pancreas: No pancreatic ductal dilatation or surrounding inflammatory changes. Spleen: No splenic injury or perisplenic hematoma. Adrenals/Urinary Tract: Adrenal glands are unremarkable. Sub-1 cm RIGHT superior renal pole renal cortical hypodense lesion, too small to adequately characterize though likely a small cyst. No follow-up indicated. Kidneys are otherwise normal, without renal calculi or hydronephrosis. Bladder is unremarkable. Stomach/Bowel: Stomach is within normal limits. Appendix is not definitively visualized. No evidence of bowel wall thickening, distention, or inflammatory changes. Vascular/Lymphatic: Moderate burden of aortic atherosclerosis. No aneurysmal dilatation. No enlarged abdominal or pelvic lymph  nodes. Reproductive: Uterus and adnexa are unremarkable. Other: *Obese with rotund abdomen.  No abdominal wall hernia. *Mild body wall edema. Anterior abdominal wall contusions, likely surgical port and additional injection sites. *Layering air  along the LEFT lateral abdominal wall fascia and additional tracking gas along a catheter, likely secondary to flushes. *Mild omental thickening without discrete nodularity, likely edema. *Trace layering fluid along the pericolic gutters and dependent pelvis. No abdominopelvic ascites. *Minimal interval decrease in size of air-and-fluid containing retrouterine/deep pelvic abscess, measuring 3.5 cm (previously 4.0 cm) in greatest dimension. *Well-positioned RIGHT lower quadrant percutaneous drainage catheter with resolution of previously demonstrated abscess collection. *Well-positioned LEFT lower quadrant/pelvic percutaneous drainage catheter with resolution of previously demonstrated abscess collection. *Well-positioned RIGHT upper quadrant/inferior perihepatic percutaneous drainage catheter with marked decreased size of previously-demonstrated abscess, now measuring 4.4 x 2.3 cm (previously 6.8 x 3.5 cm). *Well-positioned LEFT upper quadrant percutaneous drainage catheter with resolution of previously-demonstrated abscess collection. *Stable positioning of RIGHT anterolateral approach surgically placed Blake drain, coursing along the RIGHT pericolic gutter and tip within the pelvis anterior to urinary bladder. *Interval removal of additional LEFT anterolateral approach surgically placed Blake drain. Musculoskeletal: No acute osseous findings. IMPRESSION: Since CT AP dated 12/24/2021; 1. Well-positioned percutaneous drainage catheters, as described in detail above, with resolution or marked decrease in size of intra-abdominal abscesses. 2. Persistent though interval decrease in size of now 3.5 cm deep pelvic abscess. 3. Small volume RIGHT pleural effusion. 4. Cholelithiasis. No additional CT findings to suggest acute cholecystitis. Additional incidental, chronic and senescent findings as above. Electronically Signed   By: Michaelle Birks M.D.   On: 01/01/2022 13:41    Anti-infectives: Anti-infectives (From  admission, onward)    Start     Dose/Rate Route Frequency Ordered Stop   12/28/21 0600  amoxicillin-clavulanate (AUGMENTIN) 875-125 MG per tablet 1 tablet        1 tablet Oral  Once 12/28/21 0329 12/28/21 0637   12/25/21 0900  fluconazole (DIFLUCAN) IVPB 400 mg       See Hyperspace for full Linked Orders Report.   400 mg 100 mL/hr over 120 Minutes Intravenous Every 24 hours 12/23/21 1120     12/24/21 0900  fluconazole (DIFLUCAN) IVPB 800 mg       See Hyperspace for full Linked Orders Report.   800 mg 200 mL/hr over 120 Minutes Intravenous  Once 12/23/21 1120 12/24/21 1910   12/20/21 1000  micafungin (MYCAMINE) 150 mg in sodium chloride 0.9 % 100 mL IVPB  Status:  Discontinued        150 mg 107.5 mL/hr over 1 Hours Intravenous Every 24 hours 12/20/21 0904 12/23/21 1120   12/12/21 0600  ceFEPIme (MAXIPIME) 2 g in sodium chloride 0.9 % 100 mL IVPB  Status:  Discontinued        2 g 200 mL/hr over 30 Minutes Intravenous Every 12 hours 12/11/21 2129 12/11/21 2323   12/12/21 0600  piperacillin-tazobactam (ZOSYN) IVPB 3.375 g        3.375 g 12.5 mL/hr over 240 Minutes Intravenous Every 8 hours 12/11/21 2326     12/11/21 2330  piperacillin-tazobactam (ZOSYN) IVPB 3.375 g        3.375 g 100 mL/hr over 30 Minutes Intravenous To Post Anesthesia Care Unit 12/11/21 2326 12/12/21 0041   12/11/21 1630  metroNIDAZOLE (FLAGYL) IVPB 500 mg        500 mg 100 mL/hr over 60 Minutes Intravenous  Once 12/11/21 1619 12/11/21 2049   12/11/21 1630  ceFEPIme (MAXIPIME)  2 g in sodium chloride 0.9 % 100 mL IVPB        2 g 200 mL/hr over 30 Minutes Intravenous  Once 12/11/21 1623 12/11/21 1817        Assessment/Plan POD#22 - s/p diagnostic lap washout, evacuation of abscess, and JP drain placement x3 - Dr. Thermon Leyland 11/13 - CT 11/20 showed multiple loculated air and fluid collections throughout the abdomen and pelvis. S/p IR drain x 4 by 11/21 (7 drains total, 3 surgical, 4 IR) - Continue antibiotics -  this was likely perforated diverticultitis from beginning. WBC has normalized and abdominal exam stable.  - Started staged removal of surgical drains 11/30 with LUQ drain which has been serous. Removal of L abdominal surgical drain 12/1 which had been serous - LLQ IR drain remains feculent but low output. Remaining drain ouputs reassuring.  - CT yesterday with overall stability vs improvement.  Will await their assessment today for what further drains may be able to be removed. - mobilize, PT - encouraged OOB, ambulation in halls, up to chair. -eating at least 1/2 of her trays and some snacks.  Will DC her TNA -getting close to being ready for DC   FEN - Soft, ensure QID VTE - SCDs, sq heparin ID - currently on zosyn for 22 days.  Abdomen is benign and drain is controlling output.  Likely can stop abx at this point.  Discussing with primary and my MD.   Bacteroides Thetaiotaomicron bacteremia  HTN Morbid obesity OSA CHF Bipolar disorder HLD Seizures   LOS: 22 days    Henreitta Cea , [facility] 01/02/2022, 8:50 AM Please see Amion for pager number during day hours 7:00am-4:30pm

## 2022-01-03 ENCOUNTER — Inpatient Hospital Stay (HOSPITAL_COMMUNITY): Payer: Medicare PPO

## 2022-01-03 DIAGNOSIS — K668 Other specified disorders of peritoneum: Secondary | ICD-10-CM | POA: Diagnosis not present

## 2022-01-03 HISTORY — PX: IR SINUS/FIST TUBE CHK-NON GI: IMG673

## 2022-01-03 LAB — GLUCOSE, CAPILLARY
Glucose-Capillary: 122 mg/dL — ABNORMAL HIGH (ref 70–99)
Glucose-Capillary: 148 mg/dL — ABNORMAL HIGH (ref 70–99)
Glucose-Capillary: 149 mg/dL — ABNORMAL HIGH (ref 70–99)
Glucose-Capillary: 154 mg/dL — ABNORMAL HIGH (ref 70–99)
Glucose-Capillary: 195 mg/dL — ABNORMAL HIGH (ref 70–99)

## 2022-01-03 LAB — CBC
HCT: 29.9 % — ABNORMAL LOW (ref 36.0–46.0)
Hemoglobin: 9.5 g/dL — ABNORMAL LOW (ref 12.0–15.0)
MCH: 29.1 pg (ref 26.0–34.0)
MCHC: 31.8 g/dL (ref 30.0–36.0)
MCV: 91.7 fL (ref 80.0–100.0)
Platelets: 376 10*3/uL (ref 150–400)
RBC: 3.26 MIL/uL — ABNORMAL LOW (ref 3.87–5.11)
RDW: 15.7 % — ABNORMAL HIGH (ref 11.5–15.5)
WBC: 9.4 10*3/uL (ref 4.0–10.5)
nRBC: 0 % (ref 0.0–0.2)

## 2022-01-03 LAB — BASIC METABOLIC PANEL
Anion gap: 14 (ref 5–15)
BUN: 10 mg/dL (ref 6–20)
CO2: 30 mmol/L (ref 22–32)
Calcium: 9.4 mg/dL (ref 8.9–10.3)
Chloride: 96 mmol/L — ABNORMAL LOW (ref 98–111)
Creatinine, Ser: 0.72 mg/dL (ref 0.44–1.00)
GFR, Estimated: >60 mL/min (ref >60)
Glucose, Bld: 181 mg/dL — ABNORMAL HIGH (ref 70–99)
Potassium: 4.4 mmol/L (ref 3.5–5.1)
Sodium: 140 mmol/L (ref 135–145)

## 2022-01-03 LAB — MAGNESIUM: Magnesium: 1.8 mg/dL (ref 1.7–2.4)

## 2022-01-03 MED ORDER — FENTANYL CITRATE (PF) 100 MCG/2ML IJ SOLN
INTRAMUSCULAR | Status: AC
  Filled 2022-01-03: qty 2

## 2022-01-03 MED ORDER — MIDAZOLAM HCL 2 MG/2ML IJ SOLN
INTRAMUSCULAR | Status: AC
  Filled 2022-01-03: qty 2

## 2022-01-03 MED ORDER — IOHEXOL 300 MG/ML  SOLN
50.0000 mL | Freq: Once | INTRAMUSCULAR | Status: AC | PRN
  Administered 2022-01-03: 40 mL

## 2022-01-03 NOTE — Progress Notes (Signed)
Mobility Specialist Progress Note:   01/03/22 1235  Mobility  Activity Ambulated with assistance to bathroom;Ambulated with assistance in room  Level of Assistance Contact guard assist, steadying assist  Assistive Device Front wheel walker  Distance Ambulated (ft) 40 ft  Activity Response Tolerated well  Mobility Referral Yes  $Mobility charge 1 Mobility   Pt was agreeable to ambulate to BR and short room distance. SpO2 91% on RA with ambulation. Left in chair with all needs met.     Mobility Specialist Please contact via SecureChat or  Rehab office at [phone]  

## 2022-01-03 NOTE — Plan of Care (Signed)

## 2022-01-03 NOTE — Progress Notes (Signed)
PT Cancellation Note  Patient Details Name: [name] MRN: [id number] DOB: 1971/01/18   Cancelled Treatment:    Reason Eval/Treat Not Completed: Other (comment)  Declined progressive amb, worried about possibility of diarrhea;   Will follow up later today as time allows;  Otherwise, will follow up for PT tomorrow;   Thank you,  Roney Marion, [place] [facility] [name] 01/03/2022, 3:48 PM

## 2022-01-03 NOTE — Progress Notes (Signed)
23 Days Post-Op  Subjective: CC: Denies abdominal pain, n/v. Tolerating diet, eating 1/3 - 1/2 of all trays. Drank 3 ensures yesterday. Voiding. Passing flatus. Semi-solid BM yesterday. No using any prn pain or nausea medication. Mobilized with mobility tech yesterday using RW.   Afebrile. Last vitals without tachycardia (95 bpm), still intermittently tachycardic. No hypotension. WBC wnl.   Objective: Vital signs in last 24 hours: Temp:  [97.6 F (36.4 C)-98.7 F (37.1 C)] 97.6 F (36.4 C) (12/06 5631) Pulse Rate:  [95-110] 95 (12/06 0608) Resp:  [18] 18 (12/06 0608) BP: (116-137)/(70-82) 126/70 (12/06 0608) SpO2:  [90 %-92 %] 91 % (12/06 0608) Last BM Date : 12/31/21  Intake/Output from previous day: 12/05 0701 - 12/06 0700 In: 260.7 [P.O.:120; IV Piggyback:120.7] Out: 60 [Drains:60] Intake/Output this shift: No intake/output data recorded.  PE: Gen:  Alert, NAD, pleasant Lungs; Normal rate and effort  Abd: Obese, soft, NT, +BS. 2 surgical drains on left have been removed with dressings c/d/I.  LLQ drain with thin feculent material (40cc/24 hours). Left lateral drain with scant thin fluid that appears mostly serous R surgical drain serous. 2 Right IR drain serous Psych: A&Ox3   Lab Results:  Recent Labs    01/02/22 0530 01/03/22 0301  WBC 8.9 9.4  HGB 9.4* 9.5*  HCT 30.4* 29.9*  PLT 383 376   BMET Recent Labs    01/02/22 0530 01/03/22 0301  NA 138 140  K 4.3 4.4  CL 100 96*  CO2 31 30  GLUCOSE 177* 181*  BUN 13 10  CREATININE 0.60 0.72  CALCIUM 8.5* 9.4   PT/INR No results for input(s): "LABPROT", "INR" in the last 72 hours. CMP     Component Value Date/Time   NA 140 01/03/2022 0301   K 4.4 01/03/2022 0301   CL 96 (L) 01/03/2022 0301   CO2 30 01/03/2022 0301   GLUCOSE 181 (H) 01/03/2022 0301   BUN 10 01/03/2022 0301   CREATININE 0.72 01/03/2022 0301   CALCIUM 9.4 01/03/2022 0301   PROT 6.0 (L) 01/01/2022 0517   ALBUMIN 1.5 (L) 01/01/2022  0517   AST 29 01/01/2022 0517   ALT 22 01/01/2022 0517   ALKPHOS 98 01/01/2022 0517   BILITOT 0.2 (L) 01/01/2022 0517   GFRNONAA >60 01/03/2022 0301   GFRAA >60 05/11/2019 1411   Lipase     Component Value Date/Time   LIPASE 21 12/11/2021 1625    Studies/Results: CT ABDOMEN PELVIS W CONTRAST  Result Date: 01/01/2022 CLINICAL DATA:  Follow-up intra-abdominal abscess EXAM: CT ABDOMEN AND PELVIS WITH CONTRAST TECHNIQUE: Multidetector CT imaging of the abdomen and pelvis was performed using the standard protocol following bolus administration of intravenous contrast. RADIATION DOSE REDUCTION: This exam was performed according to the departmental dose-optimization program which includes automated exposure control, adjustment of the mA and/or kV according to patient size and/or use of iterative reconstruction technique. CONTRAST:  73m OMNIPAQUE IOHEXOL 350 MG/ML SOLN COMPARISON:  CT AP, 01/23/2022 and 12/19/2021.  IR CT, 12/19/2021. FINDINGS: Lower chest: Small volume RIGHT pleural effusion with adjacent dependent atelectasis. LEFT basilar pulmonary cysts and minimal atelectasis. Hepatobiliary: No focal liver abnormality. Single sub-1 cm dependent gallstone at the gallbladder neck. Nondistended gallbladder. No gallbladder wall thickening, or biliary dilatation. Pancreas: No pancreatic ductal dilatation or surrounding inflammatory changes. Spleen: No splenic injury or perisplenic hematoma. Adrenals/Urinary Tract: Adrenal glands are unremarkable. Sub-1 cm RIGHT superior renal pole renal cortical hypodense lesion, too small to adequately characterize though likely a small  cyst. No follow-up indicated. Kidneys are otherwise normal, without renal calculi or hydronephrosis. Bladder is unremarkable. Stomach/Bowel: Stomach is within normal limits. Appendix is not definitively visualized. No evidence of bowel wall thickening, distention, or inflammatory changes. Vascular/Lymphatic: Moderate burden of aortic  atherosclerosis. No aneurysmal dilatation. No enlarged abdominal or pelvic lymph nodes. Reproductive: Uterus and adnexa are unremarkable. Other: *Obese with rotund abdomen.  No abdominal wall hernia. *Mild body wall edema. Anterior abdominal wall contusions, likely surgical port and additional injection sites. *Layering air along the LEFT lateral abdominal wall fascia and additional tracking gas along a catheter, likely secondary to flushes. *Mild omental thickening without discrete nodularity, likely edema. *Trace layering fluid along the pericolic gutters and dependent pelvis. No abdominopelvic ascites. *Minimal interval decrease in size of air-and-fluid containing retrouterine/deep pelvic abscess, measuring 3.5 cm (previously 4.0 cm) in greatest dimension. *Well-positioned RIGHT lower quadrant percutaneous drainage catheter with resolution of previously demonstrated abscess collection. *Well-positioned LEFT lower quadrant/pelvic percutaneous drainage catheter with resolution of previously demonstrated abscess collection. *Well-positioned RIGHT upper quadrant/inferior perihepatic percutaneous drainage catheter with marked decreased size of previously-demonstrated abscess, now measuring 4.4 x 2.3 cm (previously 6.8 x 3.5 cm). *Well-positioned LEFT upper quadrant percutaneous drainage catheter with resolution of previously-demonstrated abscess collection. *Stable positioning of RIGHT anterolateral approach surgically placed Blake drain, coursing along the RIGHT pericolic gutter and tip within the pelvis anterior to urinary bladder. *Interval removal of additional LEFT anterolateral approach surgically placed Blake drain. Musculoskeletal: No acute osseous findings. IMPRESSION: Since CT AP dated 12/24/2021; 1. Well-positioned percutaneous drainage catheters, as described in detail above, with resolution or marked decrease in size of intra-abdominal abscesses. 2. Persistent though interval decrease in size of now 3.5  cm deep pelvic abscess. 3. Small volume RIGHT pleural effusion. 4. Cholelithiasis. No additional CT findings to suggest acute cholecystitis. Additional incidental, chronic and senescent findings as above. Electronically Signed   By: Michaelle Birks M.D.   On: 01/01/2022 13:41    Anti-infectives: Anti-infectives (From admission, onward)    Start     Dose/Rate Route Frequency Ordered Stop   12/28/21 0600  amoxicillin-clavulanate (AUGMENTIN) 875-125 MG per tablet 1 tablet        1 tablet Oral  Once 12/28/21 0329 12/28/21 0637   12/25/21 0900  fluconazole (DIFLUCAN) IVPB 400 mg  Status:  Discontinued       See Hyperspace for full Linked Orders Report.   400 mg 100 mL/hr over 120 Minutes Intravenous Every 24 hours 12/23/21 1120 01/02/22 1223   12/24/21 0900  fluconazole (DIFLUCAN) IVPB 800 mg       See Hyperspace for full Linked Orders Report.   800 mg 200 mL/hr over 120 Minutes Intravenous  Once 12/23/21 1120 12/24/21 1910   12/20/21 1000  micafungin (MYCAMINE) 150 mg in sodium chloride 0.9 % 100 mL IVPB  Status:  Discontinued        150 mg 107.5 mL/hr over 1 Hours Intravenous Every 24 hours 12/20/21 0904 12/23/21 1120   12/12/21 0600  ceFEPIme (MAXIPIME) 2 g in sodium chloride 0.9 % 100 mL IVPB  Status:  Discontinued        2 g 200 mL/hr over 30 Minutes Intravenous Every 12 hours 12/11/21 2129 12/11/21 2323   12/12/21 0600  piperacillin-tazobactam (ZOSYN) IVPB 3.375 g  Status:  Discontinued        3.375 g 12.5 mL/hr over 240 Minutes Intravenous Every 8 hours 12/11/21 2326 01/02/22 1223   12/11/21 2330  piperacillin-tazobactam (ZOSYN) IVPB 3.375 g  3.375 g 100 mL/hr over 30 Minutes Intravenous To Post Anesthesia Care Unit 12/11/21 2326 12/12/21 0041   12/11/21 1630  metroNIDAZOLE (FLAGYL) IVPB 500 mg        500 mg 100 mL/hr over 60 Minutes Intravenous  Once 12/11/21 1619 12/11/21 2049   12/11/21 1630  ceFEPIme (MAXIPIME) 2 g in sodium chloride 0.9 % 100 mL IVPB        2 g 200 mL/hr  over 30 Minutes Intravenous  Once 12/11/21 1623 12/11/21 1817        Assessment/Plan POD#23 - s/p diagnostic lap washout, evacuation of abscess, and JP drain placement x3 - Dr. Thermon Leyland 11/13 - CT 11/20 showed multiple loculated air and fluid collections throughout the abdomen and pelvis. S/p IR drain x 4 by 11/21 (7 drains total, 3 surgical, 4 IR) - This was likely perforated diverticultitis from beginning. Completed 22 days of Zosyn. Now off abx. Clinically doing well, abdominal exam benign, afebrile and WBC has normalized. - Started staged removal of surgical drains 11/30 with LUQ drain which has been serous. Removal of L abdominal surgical drain 12/1 which had been serous. Will discuss with MD about removing last surgical drain. Output is low and remains SS.  - CT 12/4 with overall stability vs improvement. IR planning drain injection with possible removal and possible drain repositioning. Would leave LLQ IR drain that remains feculent with ~40cc output daily. Remaining drain ouputs have been low and characteristic of fluid is reassuring. It appears RUQ/inferior perihepatic drain still in 4.4 x 2.3 cm fluid collection (down from 6.8 x 3.5cm) but again drain output has been low. Will follow up on IR procedure results.  - Mobilize, PT - encouraged OOB, ambulation in halls, up to chair. -getting close to being ready for DC. Could likely start working towards this after IR   FEN - NPO for IR procedure today. Can go back on soft diet and ensure QID after. Off TNA VTE - SCDs, sq heparin ID - Completed zosyn for 22 days.     Bacteroides Thetaiotaomicron bacteremia  HTN Morbid obesity OSA CHF Bipolar disorder HLD Seizures     LOS: 23 days    [name] , [facility] 01/03/2022, 8:04 AM Please see Amion for pager number during day hours 7:00am-4:30pm

## 2022-01-03 NOTE — Sedation Documentation (Signed)
Patient in holding area of Radiology Dept, awaiting transport at this time.  Pt expresses no complaints/concerns at this time and remains to appear in NAD.

## 2022-01-03 NOTE — Progress Notes (Signed)
PROGRESS NOTE    [name]  UKG:[id number] DOB: May 16, 1970 DOA: 12/11/2021  PCP: [place], Betty G, MD   Brief Narrative:  This 51 years old female with PMH significant for obstructive sleep apnea / OHS on CPAP presented to our hospital on 12/11/2021 with abdominal pain. CT scan of the abdomen pelvis showed free gas and fluid.  Patient was seen by general surgery and was taken emergently to the OR.  There was finding of well-formed abscess cavity in the lower mid abdomen near the sigmoid colon with purulent peritonitis and fibrinous exudate throughout the abdomen. Findings were suspected secondary to perforated sigmoid diverticulitis. She had laparoscopic peritoneal lavage with drains placed. Patient was intubated postoperatively and was initially in the ICU and was subsequently was transferred to our service after successful extubation.    During hospitalization, general surgery has followed the patient.  Patient has multiple abdominal drains placed by general surgery and IR.  General surgery following and has advanced on diet.  Patient was on Zosyn and fluconazole during hospitalization and the decision has been to discontinue 01/22/2022.  TPN  was discontinued.. IR to assist patient to see if further drains can be removed.  Disposition plan as per general surgery with Home health.  Assessment & Plan:   Principal Problem:   Pneumoperitoneum Active Problems:   Morbid obesity ([place])   Hyperlipidemia   Type 2 diabetes mellitus with other specified complication (HCC)   OSA on CPAP   Essential hypertension, benign   Bipolar I disorder (HCC)   Anxiety state   Chronic combined systolic and diastolic CHF (congestive heart failure) (HCC)   Colitis   Lactic acidosis   Perforation of sigmoid colon due to diverticulitis  Perforated sigmoid diverticulitis with peritonitis and abscess: S/p Diagnostic Laparoscopic Washout with drain placement. Status post IR guided multiple drain  placement. Continued on Zosyn and fluconazole. CT scan of the abdomen on 01/01/2022 showed improvement in the abdominal collection.   As per general surgery antibiotics discontinued.  Completed 22-day of Zosyn. IR is supposed to look at it today to see if the further drains could be removed.     OSA/OHS: Encourage patient to wear CPAP.   Acute kidney injury: Resolved.  Serum creatinine back to baseline.   Hyperkalemia: Improved.  Latest potassium of 4.3   Chronic HFp EF: Appears compensated at this time. Echo from Feb 2022 with improved EF 60-65%, G1DD, Lasix on hold.    Follows up with cardiology as outpatient.   Seizures, bipolar disorder, depression Continue alprazolam, Lyrica and vraylar.     History of GERD:  Continue pantoprazole.   Morbid obesity. Body mass index is 51.57 kg/m.   Would benefit from weight loss as outpatient.   DVT prophylaxis: Heparin sq Code Status: Full code Family Communication:  No family at bed side. Disposition Plan:  Status is: Inpatient Remains inpatient appropriate because:  S/p abdominal surgery, multiple drains, general surgery following.   Consultants:  General surgery PCCM Interventional radiology  Procedures:  Laparoscopic peritoneal lavage with drain placement on 12/11/2021. Intubation and subsequent extubation. NG tube placement and self removal CT-guided drain tube placement x 4 by IR on 12/19/2021 Dr. Pascal Lux. PICC line placement 11/25   Antimicrobials:  Zosyn IV 11/13> Micafungin 11/22 - 11/24 Fluconazole 11/25>> Metronidazole 11/13 - 11/13   Subjective: Patient was seen and examined at bedside.  Overnight events noted.  Patient reports doing better.   She underwent CT abdomen, states IR is going to remove drain.  Objective: Vitals:   01/03/22 0931 01/03/22 0945 01/03/22 0950 01/03/22 1516  BP: 137/69 120/89 123/80 132/67  Pulse: (!) 112 89 91 (!) 104  Resp: (!) 28 (!) 23 (!) 22 20  Temp:    98 F (36.7 C)   TempSrc:    Oral  SpO2: 97% 100% 97% 92%  Weight:      Height:        Intake/Output Summary (Last 24 hours) at 01/03/2022 1530 Last data filed at 01/03/2022 1300 Gross per 24 hour  Intake 305 ml  Output 10 ml  Net 295 ml   Filed Weights   12/11/21 2114 12/18/21 0432 12/30/21 0819  Weight: (!) 138.6 kg (!) 143.3 kg (!) 140.6 kg    Examination:  General exam: Appears comfortable, not in any acute distress.  Morbidly obese. Respiratory system: CTA bilaterally, respiratory effort normal, RR 15 Cardiovascular system: S1-S2 heard, regular rate and rhythm, no murmur. Gastrointestinal system: Soft, non distended, non tender, multiple drains noted. Central nervous system: Alert and oriented x3. No focal neurological deficits. Extremities: No edema, no cyanosis, no clubbing Skin: No rashes, lesions or ulcers Psychiatry: Judgement and insight appear normal. Mood & affect appropriate.     Data Reviewed: I have personally reviewed following labs and imaging studies  CBC: Recent Labs  Lab 12/28/21 0948 12/29/21 0824 01/02/22 0530 01/03/22 0301  WBC 13.4* 8.7 8.9 9.4  HGB 10.8* 8.9* 9.4* 9.5*  HCT 33.5* 31.3* 30.4* 29.9*  MCV 92.3 106.1* 93.8 91.7  PLT 379 331 383 324   Basic Metabolic Panel: Recent Labs  Lab 12/28/21 0948 12/29/21 0939 12/31/21 0315 01/01/22 0517 01/02/22 0530 01/03/22 0301  NA 139 135 139 138 138 140  K 5.6* 4.6 4.5 4.3 4.3 4.4  CL 101 97* 100 99 100 96*  CO2 '26 30 30 30 31 30  '$ GLUCOSE 185* 238* 211* 201* 177* 181*  BUN '12 11 11 12 13 10  '$ CREATININE 0.61 0.66 0.65 0.60 0.60 0.72  CALCIUM 8.9 8.5* 8.6* 8.5* 8.5* 9.4  MG 2.1 2.0 2.2 2.0  --  1.8  PHOS 3.7 3.4 3.5 4.1 4.2  --    GFR: Estimated Creatinine Clearance: 117.4 mL/min (by C-G formula based on SCr of 0.72 mg/dL). Liver Function Tests: Recent Labs  Lab 12/28/21 0948 01/01/22 0517  AST 34 29  ALT 22 22  ALKPHOS 92 98  BILITOT <0.1* 0.2*  PROT 6.1* 6.0*  ALBUMIN 1.7* 1.5*   No  results for input(s): "LIPASE", "AMYLASE" in the last 168 hours. No results for input(s): "AMMONIA" in the last 168 hours. Coagulation Profile: No results for input(s): "INR", "PROTIME" in the last 168 hours. Cardiac Enzymes: No results for input(s): "CKTOTAL", "CKMB", "CKMBINDEX", "TROPONINI" in the last 168 hours. BNP (last 3 results) No results for input(s): "PROBNP" in the last 8760 hours. HbA1C: No results for input(s): "HGBA1C" in the last 72 hours. CBG: Recent Labs  Lab 01/02/22 1205 01/02/22 1558 01/03/22 0002 01/03/22 0606 01/03/22 1104  GLUCAP 163* 188* 195* 148* 122*   Lipid Profile: Recent Labs    01/01/22 0517  TRIG 135   Thyroid Function Tests: No results for input(s): "TSH", "T4TOTAL", "FREET4", "T3FREE", "THYROIDAB" in the last 72 hours. Anemia Panel: No results for input(s): "VITAMINB12", "FOLATE", "FERRITIN", "TIBC", "IRON", "RETICCTPCT" in the last 72 hours. Sepsis Labs: No results for input(s): "PROCALCITON", "LATICACIDVEN" in the last 168 hours.  No results found for this or any previous visit (from the past 240 hour(s)).  Radiology Studies: IR Sinus/Fist Tube Chk-Non GI  Result Date: 01/03/2022 INDICATION: Postop abscess drains, decreased output EXAM: FLUOROSCOPIC INJECTION OF THE 4 ABDOMINAL ABSCESS DRAINS MEDICATIONS: The patient is currently admitted to the hospital and receiving intravenous antibiotics. The antibiotics were administered within an appropriate time frame prior to the initiation of the procedure. ANESTHESIA/SEDATION: None. COMPLICATIONS: None immediate. PROCEDURE: Informed written consent was obtained from the patient after a thorough discussion of the procedural risks, benefits and alternatives. All questions were addressed. Maximal Sterile Barrier Technique was utilized including caps, mask, sterile gowns, sterile gloves, sterile drape, hand hygiene and skin antiseptic. A timeout was performed prior to the initiation of the  procedure. Patient has a total of 4 percutaneous drains, 2 on each side. Injection of the 2 right-sided drains initially performed. Collapsed abscess cavities. Negative for fistula. No significant residual fluid by CT at these 2 drain catheter sites. Both drains were removed. Injection of the 2 left-sided drains. Left superior drain has a small collapsed abscess. Negative for fistula. No significant residual fluid at this catheter site by CT. Drain removed. 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 This correlates with the fecal contaminated output. Residual abscess cavity remains. This drain will remain to gravity drainage. IMPRESSION: The 2 right-sided drains and left superior drain are negative for fistula with no significant residual abscess by CT. These 3 drains were removed. 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 Drain will remain to gravity drainage. Electronically Signed   By: Jerilynn Mages.  Shick M.D.   On: 01/03/2022 11:30   IR Sinus/Fist Tube Chk-Non GI  Result Date: 01/03/2022 INDICATION: Postop abscess drains, decreased output EXAM: FLUOROSCOPIC INJECTION OF THE 4 ABDOMINAL ABSCESS DRAINS MEDICATIONS: The patient is currently admitted to the hospital and receiving intravenous antibiotics. The antibiotics were administered within an appropriate time frame prior to the initiation of the procedure. ANESTHESIA/SEDATION: None. COMPLICATIONS: None immediate. PROCEDURE: Informed written consent was obtained from the patient after a thorough discussion of the procedural risks, benefits and alternatives. All questions were addressed. Maximal Sterile Barrier Technique was utilized including caps, mask, sterile gowns, sterile gloves, sterile drape, hand hygiene and skin antiseptic. A timeout was performed prior to the initiation of  the procedure. Patient has a total of 4 percutaneous drains, 2 on each side. Injection of the 2 right-sided drains initially performed. Collapsed abscess cavities. Negative for fistula. No significant residual fluid by CT at these 2 drain catheter sites. Both drains were removed. Injection of the 2 left-sided drains. Left superior drain has a small collapsed abscess. Negative for fistula. No significant residual fluid at this catheter site by CT. Drain removed. 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 This correlates with the fecal contaminated output. Residual abscess cavity remains. This drain will remain to gravity drainage. IMPRESSION: The 2 right-sided drains and left superior drain are negative for fistula with no significant residual abscess by CT. These 3 drains were removed. 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 Drain will remain to gravity drainage. Electronically Signed   By: Jerilynn Mages.  Shick M.D.   On: 01/03/2022 11:30   IR Sinus/Fist Tube Chk-Non GI  Result Date: 01/03/2022 INDICATION: Postop abscess drains, decreased output EXAM:  FLUOROSCOPIC INJECTION OF THE 4 ABDOMINAL ABSCESS DRAINS MEDICATIONS: The patient is currently admitted to the hospital and receiving intravenous antibiotics. The antibiotics were administered within an appropriate time frame prior to the initiation of the procedure. ANESTHESIA/SEDATION: None. COMPLICATIONS: None immediate. PROCEDURE: Informed written consent was obtained from the patient after a thorough discussion of the procedural risks, benefits and alternatives. All questions were addressed. Maximal Sterile Barrier Technique was utilized including caps, mask, sterile gowns, sterile gloves, sterile drape, hand hygiene and skin antiseptic. A timeout was performed prior to the initiation  of the procedure. Patient has a total of 4 percutaneous drains, 2 on each side. Injection of the 2 right-sided drains initially performed. Collapsed abscess cavities. Negative for fistula. No significant residual fluid by CT at these 2 drain catheter sites. Both drains were removed. Injection of the 2 left-sided drains. Left superior drain has a small collapsed abscess. Negative for fistula. No significant residual fluid at this catheter site by CT. Drain removed. 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 This correlates with the fecal contaminated output. Residual abscess cavity remains. This drain will remain to gravity drainage. IMPRESSION: The 2 right-sided drains and left superior drain are negative for fistula with no significant residual abscess by CT. These 3 drains were removed. 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 Drain will remain to gravity drainage. Electronically Signed   By: Jerilynn Mages.  Shick M.D.   On: 01/03/2022 11:30   IR Sinus/Fist Tube Chk-Non GI  Result Date: 01/03/2022 INDICATION: Postop abscess drains, decreased output EXAM: FLUOROSCOPIC INJECTION OF THE 4 ABDOMINAL ABSCESS DRAINS MEDICATIONS: The patient is currently admitted to the hospital and receiving intravenous antibiotics. The antibiotics were administered within an appropriate time frame prior to the initiation of the procedure. ANESTHESIA/SEDATION: None. COMPLICATIONS: None immediate. PROCEDURE: Informed written consent was obtained from the patient after a thorough discussion of the procedural risks, benefits and alternatives. All questions were addressed. Maximal Sterile Barrier Technique was utilized including caps, mask, sterile gowns, sterile gloves, sterile drape, hand hygiene and skin antiseptic. A timeout was performed prior to the  initiation of the procedure. Patient has a total of 4 percutaneous drains, 2 on each side. Injection of the 2 right-sided drains initially performed. Collapsed abscess cavities. Negative for fistula. No significant residual fluid by CT at these 2 drain catheter sites. Both drains were removed. Injection of the 2 left-sided drains. Left superior drain has a small collapsed abscess. Negative for fistula. No significant residual fluid at this catheter site by CT. Drain removed. 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 This correlates with the fecal contaminated output. Residual abscess cavity remains. This drain will remain to gravity drainage. IMPRESSION: The 2 right-sided drains and left superior drain are negative for fistula with no significant residual abscess by CT. These 3 drains were removed. 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 Drain will remain to gravity drainage. Electronically Signed   By: Jerilynn Mages.  Shick M.D.   On: 01/03/2022 11:30     Scheduled Meds:  acetaminophen  1,000 mg Oral Q6H   ALPRAZolam  0.5 mg Oral TID   amphetamine-dextroamphetamine  20 mg Oral TID   cariprazine  6 mg Oral  QPM   Chlorhexidine Gluconate Cloth  6 each Topical Daily   Dextromethorphan-quiNIDine  1 capsule Oral Q12H   feeding supplement  237 mL Oral QID   heparin injection (subcutaneous)  5,000 Units Subcutaneous Q8H   insulin aspart  0-20 Units Subcutaneous TID WC   insulin glargine-yfgn  15 Units Subcutaneous QHS   multivitamin with minerals  1 tablet Oral Daily   mouth rinse  15 mL Mouth Rinse 4 times per day   pantoprazole  40 mg Oral BID   pregabalin  300 mg Oral BID   psyllium  1 packet Oral Daily   sodium chloride flush  5 mL Intracatheter Q8H   Continuous Infusions:   LOS: 23 days    Time spent: 50  mins    Larnie Heart, MD [facility]   If 7PM-7AM, please contact night-coverage

## 2022-01-04 DIAGNOSIS — K668 Other specified disorders of peritoneum: Secondary | ICD-10-CM | POA: Diagnosis not present

## 2022-01-04 LAB — GLUCOSE, CAPILLARY
Glucose-Capillary: 156 mg/dL — ABNORMAL HIGH (ref 70–99)
Glucose-Capillary: 136 mg/dL — ABNORMAL HIGH (ref 70–99)
Glucose-Capillary: 142 mg/dL — ABNORMAL HIGH (ref 70–99)

## 2022-01-04 NOTE — Progress Notes (Signed)
PROGRESS NOTE    [name]  CNO:[id number] DOB: 1970/11/18 DOA: 12/11/2021  PCP: [place], Betty G, MD   Brief Narrative:  This 51 years old female with PMH significant for obstructive sleep apnea / OHS on CPAP presented to our hospital on 12/11/2021 with abdominal pain. CT scan of the abdomen pelvis showed free gas and fluid.  Patient was seen by general surgery and was taken emergently to the OR.  There was finding of well-formed abscess cavity in the lower mid abdomen near the sigmoid colon with purulent peritonitis and fibrinous exudate throughout the abdomen. Findings were suspected secondary to perforated sigmoid diverticulitis. She had laparoscopic peritoneal lavage with drains placed. Patient was intubated postoperatively and was initially in the ICU and was subsequently was transferred to our service after successful extubation.    During hospitalization, general surgery has followed the patient.  Patient has multiple abdominal drains placed by general surgery and IR.  General surgery following and has advanced on diet.  Patient was on Zosyn and fluconazole during hospitalization and the decision has been to discontinue 01/22/2022.  TPN  was discontinued.. IR to assist patient to see if further drains can be removed.  Disposition plan as per general surgery with Home health.  Assessment & Plan:   Principal Problem:   Pneumoperitoneum Active Problems:   Morbid obesity ([place])   Hyperlipidemia   Type 2 diabetes mellitus with other specified complication (HCC)   OSA on CPAP   Essential hypertension, benign   Bipolar I disorder (HCC)   Anxiety state   Chronic combined systolic and diastolic CHF (congestive heart failure) (HCC)   Colitis   Lactic acidosis   Perforation of sigmoid colon due to diverticulitis  Perforated sigmoid diverticulitis with peritonitis and abscess: S/p Diagnostic Laparoscopic Washout with drain placement. Status post IR guided multiple drain  placement. Continued on Zosyn and fluconazole. CT scan of the abdomen on 01/01/2022 showed improvement in the abdominal collection.   As per general surgery antibiotics discontinued.  Completed 22-day of Zosyn. IR to see if the further drains could be removed.     OSA/OHS: Encourage patient to wear CPAP.   Acute kidney injury: Resolved.  Serum creatinine back to baseline.   Hyperkalemia: Improved.  Latest potassium of 4.3   Chronic HFp EF: Appears compensated at this time. Echo from Feb 2022 with improved EF 60-65%, G1DD, Lasix on hold.    Follows up with cardiology as outpatient.   Seizures, bipolar disorder, depression Continue alprazolam, Lyrica and vraylar.     History of GERD:  Continue pantoprazole.   Morbid obesity. Body mass index is 51.57 kg/m.   Would benefit from weight loss as outpatient.   DVT prophylaxis: Heparin sq Code Status: Full code Family Communication:  No family at bed side. Disposition Plan:  Status is: Inpatient Remains inpatient appropriate because:  S/p abdominal surgery, multiple drains, general surgery following.   Consultants:  General surgery PCCM Interventional radiology  Procedures:  Laparoscopic peritoneal lavage with drain placement on 12/11/2021. Intubation and subsequent extubation. NG tube placement and self removal CT-guided drain tube placement x 4 by IR on 12/19/2021 Dr. Pascal Lux. PICC line placement 11/25   Antimicrobials:  Zosyn IV 11/13> Micafungin 11/22 - 11/24 Fluconazole 11/25>> Metronidazole 11/13 - 11/13   Subjective: Patient was seen and examined at bedside.  Overnight events noted.  Patient reports doing better. Patient reports IR is planning to remove drains.  Objective: Vitals:   01/04/22 0557 01/04/22 0559 01/04/22 0600 01/04/22 2947  BP: (!) 141/76   138/68  Pulse: (!) 113 (!) 109  100  Resp: 17   20  Temp: 98.7 F (37.1 C)   98.4 F (36.9 C)  TempSrc: Oral   Oral  SpO2: (!) 87% (!) 85% 96% 98%   Weight:      Height:        Intake/Output Summary (Last 24 hours) at 01/04/2022 1444 Last data filed at 01/04/2022 1300 Gross per 24 hour  Intake 840 ml  Output 20 ml  Net 820 ml   Filed Weights   12/11/21 2114 12/18/21 0432 12/30/21 0819  Weight: (!) 138.6 kg (!) 143.3 kg (!) 140.6 kg    Examination:  General exam: Appears comfortable, not in any acute distress, morbidly obese. Respiratory system: CTA bilaterally, respiratory effort normal, RR 15 Cardiovascular system: S1-S2 heard, regular rate and rhythm, no murmur. Gastrointestinal system: Abdomen is soft, non distended, non tender, multiple drains noted. Central nervous system: Alert and oriented x3. No focal neurological deficits. Extremities: No edema, no cyanosis, no clubbing Skin: No rashes, lesions or ulcers Psychiatry: Judgement and insight appear normal. Mood & affect appropriate.     Data Reviewed: I have personally reviewed following labs and imaging studies  CBC: Recent Labs  Lab 12/29/21 0824 01/02/22 0530 01/03/22 0301  WBC 8.7 8.9 9.4  HGB 8.9* 9.4* 9.5*  HCT 31.3* 30.4* 29.9*  MCV 106.1* 93.8 91.7  PLT 331 383 599   Basic Metabolic Panel: Recent Labs  Lab 12/29/21 0939 12/31/21 0315 01/01/22 0517 01/02/22 0530 01/03/22 0301  NA 135 139 138 138 140  K 4.6 4.5 4.3 4.3 4.4  CL 97* 100 99 100 96*  CO2 '30 30 30 31 30  '$ GLUCOSE 238* 211* 201* 177* 181*  BUN '11 11 12 13 10  '$ CREATININE 0.66 0.65 0.60 0.60 0.72  CALCIUM 8.5* 8.6* 8.5* 8.5* 9.4  MG 2.0 2.2 2.0  --  1.8  PHOS 3.4 3.5 4.1 4.2  --    GFR: Estimated Creatinine Clearance: 117.4 mL/min (by C-G formula based on SCr of 0.72 mg/dL). Liver Function Tests: Recent Labs  Lab 01/01/22 0517  AST 29  ALT 22  ALKPHOS 98  BILITOT 0.2*  PROT 6.0*  ALBUMIN 1.5*   No results for input(s): "LIPASE", "AMYLASE" in the last 168 hours. No results for input(s): "AMMONIA" in the last 168 hours. Coagulation Profile: No results for input(s):  "INR", "PROTIME" in the last 168 hours. Cardiac Enzymes: No results for input(s): "CKTOTAL", "CKMB", "CKMBINDEX", "TROPONINI" in the last 168 hours. BNP (last 3 results) No results for input(s): "PROBNP" in the last 8760 hours. HbA1C: No results for input(s): "HGBA1C" in the last 72 hours. CBG: Recent Labs  Lab 01/03/22 1104 01/03/22 1544 01/03/22 2057 01/04/22 0827 01/04/22 1131  GLUCAP 122* 149* 154* 156* 136*   Lipid Profile: No results for input(s): "CHOL", "HDL", "LDLCALC", "TRIG", "CHOLHDL", "LDLDIRECT" in the last 72 hours.  Thyroid Function Tests: No results for input(s): "TSH", "T4TOTAL", "FREET4", "T3FREE", "THYROIDAB" in the last 72 hours. Anemia Panel: No results for input(s): "VITAMINB12", "FOLATE", "FERRITIN", "TIBC", "IRON", "RETICCTPCT" in the last 72 hours. Sepsis Labs: No results for input(s): "PROCALCITON", "LATICACIDVEN" in the last 168 hours.  No results found for this or any previous visit (from the past 240 hour(s)).       Radiology Studies: IR Sinus/Fist Tube Chk-Non GI  Result Date: 01/03/2022 INDICATION: Postop abscess drains, decreased output EXAM: FLUOROSCOPIC INJECTION OF THE 4 ABDOMINAL ABSCESS DRAINS MEDICATIONS: The  patient is currently admitted to the hospital and receiving intravenous antibiotics. The antibiotics were administered within an appropriate time frame prior to the initiation of the procedure. ANESTHESIA/SEDATION: None. COMPLICATIONS: None immediate. PROCEDURE: Informed written consent was obtained from the patient after a thorough discussion of the procedural risks, benefits and alternatives. All questions were addressed. Maximal Sterile Barrier Technique was utilized including caps, mask, sterile gowns, sterile gloves, sterile drape, hand hygiene and skin antiseptic. A timeout was performed prior to the initiation of the procedure. Patient has a total of 4 percutaneous drains, 2 on each side. Injection of the 2 right-sided drains  initially performed. Collapsed abscess cavities. Negative for fistula. No significant residual fluid by CT at these 2 drain catheter sites. Both drains were removed. Injection of the 2 left-sided drains. Left superior drain has a small collapsed abscess. Negative for fistula. No significant residual fluid at this catheter site by CT. Drain removed. 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 This correlates with the fecal contaminated output. Residual abscess cavity remains. This drain will remain to gravity drainage. IMPRESSION: The 2 right-sided drains and left superior drain are negative for fistula with no significant residual abscess by CT. These 3 drains were removed. 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 Drain will remain to gravity drainage. Electronically Signed   By: Jerilynn Mages.  Shick M.D.   On: 01/03/2022 11:30   IR Sinus/Fist Tube Chk-Non GI  Result Date: 01/03/2022 INDICATION: Postop abscess drains, decreased output EXAM: FLUOROSCOPIC INJECTION OF THE 4 ABDOMINAL ABSCESS DRAINS MEDICATIONS: The patient is currently admitted to the hospital and receiving intravenous antibiotics. The antibiotics were administered within an appropriate time frame prior to the initiation of the procedure. ANESTHESIA/SEDATION: None. COMPLICATIONS: None immediate. PROCEDURE: Informed written consent was obtained from the patient after a thorough discussion of the procedural risks, benefits and alternatives. All questions were addressed. Maximal Sterile Barrier Technique was utilized including caps, mask, sterile gowns, sterile gloves, sterile drape, hand hygiene and skin antiseptic. A timeout was performed prior to the initiation of the procedure. Patient has a total of 4 percutaneous drains, 2 on each side. Injection of the 2 right-sided  drains initially performed. Collapsed abscess cavities. Negative for fistula. No significant residual fluid by CT at these 2 drain catheter sites. Both drains were removed. Injection of the 2 left-sided drains. Left superior drain has a small collapsed abscess. Negative for fistula. No significant residual fluid at this catheter site by CT. Drain removed. 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 This correlates with the fecal contaminated output. Residual abscess cavity remains. This drain will remain to gravity drainage. IMPRESSION: The 2 right-sided drains and left superior drain are negative for fistula with no significant residual abscess by CT. These 3 drains were removed. 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 Drain will remain to gravity drainage. Electronically Signed   By: Jerilynn Mages.  Shick M.D.   On: 01/03/2022 11:30   IR Sinus/Fist Tube Chk-Non GI  Result Date: 01/03/2022 INDICATION: Postop abscess drains, decreased output EXAM: FLUOROSCOPIC INJECTION OF THE 4 ABDOMINAL ABSCESS DRAINS MEDICATIONS: The patient is currently admitted to the hospital and receiving intravenous antibiotics. The antibiotics were administered within an appropriate  time frame prior to the initiation of the procedure. ANESTHESIA/SEDATION: None. COMPLICATIONS: None immediate. PROCEDURE: Informed written consent was obtained from the patient after a thorough discussion of the procedural risks, benefits and alternatives. All questions were addressed. Maximal Sterile Barrier Technique was utilized including caps, mask, sterile gowns, sterile gloves, sterile drape, hand hygiene and skin antiseptic. A timeout was performed prior to the initiation of the procedure. Patient has a total of 4 percutaneous drains, 2 on each side. Injection of the 2  right-sided drains initially performed. Collapsed abscess cavities. Negative for fistula. No significant residual fluid by CT at these 2 drain catheter sites. Both drains were removed. Injection of the 2 left-sided drains. Left superior drain has a small collapsed abscess. Negative for fistula. No significant residual fluid at this catheter site by CT. Drain removed. 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 This correlates with the fecal contaminated output. Residual abscess cavity remains. This drain will remain to gravity drainage. IMPRESSION: The 2 right-sided drains and left superior drain are negative for fistula with no significant residual abscess by CT. These 3 drains were removed. 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 Drain will remain to gravity drainage. Electronically Signed   By: Jerilynn Mages.  Shick M.D.   On: 01/03/2022 11:30   IR Sinus/Fist Tube Chk-Non GI  Result Date: 01/03/2022 INDICATION: Postop abscess drains, decreased output EXAM: FLUOROSCOPIC INJECTION OF THE 4 ABDOMINAL ABSCESS DRAINS MEDICATIONS: The patient is currently admitted to the hospital and receiving intravenous antibiotics. The antibiotics were administered within an appropriate time frame prior to the initiation of the procedure. ANESTHESIA/SEDATION: None. COMPLICATIONS: None immediate. PROCEDURE: Informed written consent was obtained from the patient after a thorough discussion of the procedural risks, benefits and alternatives. All questions were addressed. Maximal Sterile Barrier Technique was utilized including caps, mask, sterile gowns, sterile gloves, sterile drape, hand hygiene and skin antiseptic. A timeout was performed prior to the initiation of the procedure. Patient has a total of 4 percutaneous drains, 2 on each side. Injection of  the 2 right-sided drains initially performed. Collapsed abscess cavities. Negative for fistula. No significant residual fluid by CT at these 2 drain catheter sites. Both drains were removed. Injection of the 2 left-sided drains. Left superior drain has a small collapsed abscess. Negative for fistula. No significant residual fluid at this catheter site by CT. Drain removed. 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 This correlates with the fecal contaminated output. Residual abscess cavity remains. This drain will remain to gravity drainage. IMPRESSION: The 2 right-sided drains and left superior drain are negative for fistula with no significant residual abscess by CT. These 3 drains were removed. 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 Drain will remain to gravity drainage. Electronically Signed   By: Jerilynn Mages.  Shick M.D.   On: 01/03/2022 11:30     Scheduled Meds:  acetaminophen  1,000 mg Oral Q6H   ALPRAZolam  0.5 mg Oral TID   amphetamine-dextroamphetamine  20 mg Oral TID   cariprazine  6 mg Oral QPM   Chlorhexidine Gluconate Cloth  6 each Topical Daily   Dextromethorphan-quiNIDine  1 capsule Oral Q12H   feeding supplement  237 mL Oral QID  heparin injection (subcutaneous)  5,000 Units Subcutaneous Q8H   insulin aspart  0-20 Units Subcutaneous TID WC   insulin glargine-yfgn  15 Units Subcutaneous QHS   multivitamin with minerals  1 tablet Oral Daily   mouth rinse  15 mL Mouth Rinse 4 times per day   pantoprazole  40 mg Oral BID   pregabalin  300 mg Oral BID   psyllium  1 packet Oral Daily   sodium chloride flush  5 mL Intracatheter Q8H   Continuous Infusions:   LOS: 24 days    Time spent: 35 mins    Atarah Cadogan, MD [facility]   If 7PM-7AM, please contact night-coverage

## 2022-01-04 NOTE — Progress Notes (Addendum)
PT Cancellation Note  Patient Details Name: AOLANIS CRISPEN MRN: [id number] DOB: 05-29-1970   Cancelled Treatment:    Reason Eval/Treat Not Completed: Patient declined, no reason specified  3rd attempt to work with patient today; refused. Encouraged OOB as much as possible. Does not want up for lunch. Seems to be more agreeable when family is in the room to encourage pt.   Candie Mile, PT, DPT Physical Therapist [facility]  01/04/2022, 11:53 AM

## 2022-01-04 NOTE — Progress Notes (Signed)
Pt has an at home cpap machine, with 2 L O2 bled in. RT helped pt place it on for the night. Pt is now resting comfortably at this time.

## 2022-01-04 NOTE — Progress Notes (Signed)
24 Days Post-Op   Subjective/Chief Complaint: Resting comfortably with her cpap   Objective: Vital signs in last 24 hours: Temp:  [97.6 F (36.4 C)-98.7 F (37.1 C)] 98.4 F (36.9 C) (12/07 0925) Pulse Rate:  [100-113] 100 (12/07 0925) Resp:  [17-20] 20 (12/07 0925) BP: (123-141)/(68-76) 138/68 (12/07 0925) SpO2:  [85 %-98 %] 98 % (12/07 0925) Last BM Date : 01/04/22  Intake/Output from previous day: 12/06 0701 - 12/07 0700 In: 305 [P.O.:300] Out: 10 [Drains:10] Intake/Output this shift: Total I/O In: 840 [P.O.:840] Out: 40 [Drains:40]  General appearance: alert and cooperative Resp: clear to auscultation bilaterally Cardio: regular rate and rhythm GI: soft, minimal tenderness  Lab Results:  Recent Labs    01/02/22 0530 01/03/22 0301  WBC 8.9 9.4  HGB 9.4* 9.5*  HCT 30.4* 29.9*  PLT 383 376   BMET Recent Labs    01/02/22 0530 01/03/22 0301  NA 138 140  K 4.3 4.4  CL 100 96*  CO2 31 30  GLUCOSE 177* 181*  BUN 13 10  CREATININE 0.60 0.72  CALCIUM 8.5* 9.4   PT/INR No results for input(s): "LABPROT", "INR" in the last 72 hours. ABG No results for input(s): "PHART", "HCO3" in the last 72 hours.  Invalid input(s): "PCO2", "PO2"  Studies/Results: IR Sinus/Fist Tube Chk-Non GI  Result Date: 01/03/2022 INDICATION: Postop abscess drains, decreased output EXAM: FLUOROSCOPIC INJECTION OF THE 4 ABDOMINAL ABSCESS DRAINS MEDICATIONS: The patient is currently admitted to the hospital and receiving intravenous antibiotics. The antibiotics were administered within an appropriate time frame prior to the initiation of the procedure. ANESTHESIA/SEDATION: None. COMPLICATIONS: None immediate. PROCEDURE: Informed written consent was obtained from the patient after a thorough discussion of the procedural risks, benefits and alternatives. All questions were addressed. Maximal Sterile Barrier Technique was utilized including caps, mask, sterile gowns, sterile gloves, sterile  drape, hand hygiene and skin antiseptic. A timeout was performed prior to the initiation of the procedure. Patient has a total of 4 percutaneous drains, 2 on each side. Injection of the 2 right-sided drains initially performed. Collapsed abscess cavities. Negative for fistula. No significant residual fluid by CT at these 2 drain catheter sites. Both drains were removed. Injection of the 2 left-sided drains. Left superior drain has a small collapsed abscess. Negative for fistula. No significant residual fluid at this catheter site by CT. Drain removed. 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 This correlates with the fecal contaminated output. Residual abscess cavity remains. This drain will remain to gravity drainage. IMPRESSION: The 2 right-sided drains and left superior drain are negative for fistula with no significant residual abscess by CT. These 3 drains were removed. 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 Drain will remain to gravity drainage. Electronically Signed   By: Jerilynn Mages.  Shick M.D.   On: 01/03/2022 11:30   IR Sinus/Fist Tube Chk-Non GI  Result Date: 01/03/2022 INDICATION: Postop abscess drains, decreased output EXAM: FLUOROSCOPIC INJECTION OF THE 4 ABDOMINAL ABSCESS DRAINS MEDICATIONS: The patient is currently admitted to the hospital and receiving intravenous antibiotics. The antibiotics were administered within an appropriate time frame prior to the initiation of the procedure. ANESTHESIA/SEDATION: None. COMPLICATIONS: None immediate. PROCEDURE: Informed written consent was obtained from the patient after a thorough discussion of the procedural risks, benefits and alternatives. All questions were addressed. Maximal Sterile Barrier Technique was utilized  including caps, mask, sterile gowns, sterile gloves,  sterile drape, hand hygiene and skin antiseptic. A timeout was performed prior to the initiation of the procedure. Patient has a total of 4 percutaneous drains, 2 on each side. Injection of the 2 right-sided drains initially performed. Collapsed abscess cavities. Negative for fistula. No significant residual fluid by CT at these 2 drain catheter sites. Both drains were removed. Injection of the 2 left-sided drains. Left superior drain has a small collapsed abscess. Negative for fistula. No significant residual fluid at this catheter site by CT. Drain removed. 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 This correlates with the fecal contaminated output. Residual abscess cavity remains. This drain will remain to gravity drainage. IMPRESSION: The 2 right-sided drains and left superior drain are negative for fistula with no significant residual abscess by CT. These 3 drains were removed. 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 Drain will remain to gravity drainage. Electronically Signed   By: Jerilynn Mages.  Shick M.D.   On: 01/03/2022 11:30   IR Sinus/Fist Tube Chk-Non GI  Result Date: 01/03/2022 INDICATION: Postop abscess drains, decreased output EXAM: FLUOROSCOPIC INJECTION OF THE 4 ABDOMINAL ABSCESS DRAINS MEDICATIONS: The patient is currently admitted to the hospital and receiving intravenous antibiotics. The antibiotics were administered within an appropriate time frame prior to the initiation of the procedure. ANESTHESIA/SEDATION: None. COMPLICATIONS: None immediate. PROCEDURE: Informed written consent was obtained from the patient after a thorough discussion of the procedural risks, benefits and alternatives. All questions were addressed. Maximal Sterile Barrier Technique was utilized including caps, mask, sterile gowns, sterile  gloves, sterile drape, hand hygiene and skin antiseptic. A timeout was performed prior to the initiation of the procedure. Patient has a total of 4 percutaneous drains, 2 on each side. Injection of the 2 right-sided drains initially performed. Collapsed abscess cavities. Negative for fistula. No significant residual fluid by CT at these 2 drain catheter sites. Both drains were removed. Injection of the 2 left-sided drains. Left superior drain has a small collapsed abscess. Negative for fistula. No significant residual fluid at this catheter site by CT. Drain removed. 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 This correlates with the fecal contaminated output. Residual abscess cavity remains. This drain will remain to gravity drainage. IMPRESSION: The 2 right-sided drains and left superior drain are negative for fistula with no significant residual abscess by CT. These 3 drains were removed. 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 Drain will remain to gravity drainage. Electronically Signed   By: Jerilynn Mages.  Shick M.D.   On: 01/03/2022 11:30   IR Sinus/Fist Tube Chk-Non GI  Result Date: 01/03/2022 INDICATION: Postop abscess drains, decreased output EXAM: FLUOROSCOPIC INJECTION OF THE 4 ABDOMINAL ABSCESS DRAINS MEDICATIONS: The patient is currently admitted to the hospital and receiving intravenous antibiotics. The antibiotics were administered within an appropriate time frame prior to the initiation of the procedure. ANESTHESIA/SEDATION: None. COMPLICATIONS: None immediate. PROCEDURE: Informed written consent was obtained from the patient after a thorough discussion of the procedural risks, benefits and alternatives. All questions were addressed. Maximal Sterile Barrier Technique was utilized including caps, mask, sterile gowns,  sterile gloves, sterile drape, hand hygiene and skin antiseptic. A timeout was  performed prior to the initiation of the procedure. Patient has a total of 4 percutaneous drains, 2 on each side. Injection of the 2 right-sided drains initially performed. Collapsed abscess cavities. Negative for fistula. No significant residual fluid by CT at these 2 drain catheter sites. Both drains were removed. Injection of the 2 left-sided drains. Left superior drain has a small collapsed abscess. Negative for fistula. No significant residual fluid at this catheter site by CT. Drain removed. 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 This correlates with the fecal contaminated output. Residual abscess cavity remains. This drain will remain to gravity drainage. IMPRESSION: The 2 right-sided drains and left superior drain are negative for fistula with no significant residual abscess by CT. These 3 drains were removed. 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 Drain will remain to gravity drainage. Electronically Signed   By: Jerilynn Mages.  Shick M.D.   On: 01/03/2022 11:30    Anti-infectives: Anti-infectives (From admission, onward)    Start     Dose/Rate Route Frequency Ordered Stop   12/28/21 0600  amoxicillin-clavulanate (AUGMENTIN) 875-125 MG per tablet 1 tablet        1 tablet Oral  Once 12/28/21 0329 12/28/21 0637   12/25/21 0900  fluconazole (DIFLUCAN) IVPB 400 mg  Status:  Discontinued       See Hyperspace for full Linked Orders Report.   400 mg 100 mL/hr over 120 Minutes Intravenous Every 24 hours 12/23/21 1120 01/02/22 1223   12/24/21 0900  fluconazole (DIFLUCAN) IVPB 800 mg       See Hyperspace for full Linked Orders Report.   800 mg 200 mL/hr over 120 Minutes Intravenous  Once 12/23/21 1120 12/24/21 1910   12/20/21 1000  micafungin  (MYCAMINE) 150 mg in sodium chloride 0.9 % 100 mL IVPB  Status:  Discontinued        150 mg 107.5 mL/hr over 1 Hours Intravenous Every 24 hours 12/20/21 0904 12/23/21 1120   12/12/21 0600  ceFEPIme (MAXIPIME) 2 g in sodium chloride 0.9 % 100 mL IVPB  Status:  Discontinued        2 g 200 mL/hr over 30 Minutes Intravenous Every 12 hours 12/11/21 2129 12/11/21 2323   12/12/21 0600  piperacillin-tazobactam (ZOSYN) IVPB 3.375 g  Status:  Discontinued        3.375 g 12.5 mL/hr over 240 Minutes Intravenous Every 8 hours 12/11/21 2326 01/02/22 1223   12/11/21 2330  piperacillin-tazobactam (ZOSYN) IVPB 3.375 g        3.375 g 100 mL/hr over 30 Minutes Intravenous To Post Anesthesia Care Unit 12/11/21 2326 12/12/21 0041   12/11/21 1630  metroNIDAZOLE (FLAGYL) IVPB 500 mg        500 mg 100 mL/hr over 60 Minutes Intravenous  Once 12/11/21 1619 12/11/21 2049   12/11/21 1630  ceFEPIme (MAXIPIME) 2 g in sodium chloride 0.9 % 100 mL IVPB        2 g 200 mL/hr over 30 Minutes Intravenous  Once 12/11/21 1623 12/11/21 1817       Assessment/Plan: s/p Procedure(s): LAPAROSCOPIC PERITONEAL LAVAGE WITH DRAIN PLACEMENT (N/A) Advance diet Continue drains.  Abx stopped POD#24 - s/p diagnostic lap washout, evacuation of abscess, and JP drain placement x3 - Dr. Thermon Leyland 11/13 - CT 11/20 showed multiple loculated air and fluid collections throughout the abdomen and pelvis. 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 Completed 22 days of Zosyn. Now off abx. Clinically doing well, abdominal exam benign, afebrile and WBC has normalized. - Started staged removal of surgical drains 11/30 with LUQ drain which has been serous. Removal of L abdominal surgical drain 12/1 which had been serous. Will discuss with MD about removing last surgical drain. Output is low and remains SS.  - CT 12/4 with overall stability vs improvement. IR planning  drain injection with possible removal and possible drain repositioning. Would leave LLQ IR drain that remains feculent with ~40cc output daily. Remaining drain ouputs have been low and characteristic of fluid is reassuring. It appears RUQ/inferior perihepatic drain still in 4.4 x 2.3 cm fluid collection (down from 6.8 x 3.5cm) but again drain output has been low. Will follow up on IR procedure results.  - Mobilize, PT - encouraged OOB, ambulation in halls, up to chair. -getting close to being ready for DC. Could likely start working towards this after IR   FEN - NPO for IR procedure today. Can go back on soft diet and ensure QID after. Off TNA VTE - SCDs, sq heparin ID - Completed zosyn for 22 days.     Bacteroides Thetaiotaomicron bacteremia  HTN Morbid obesity OSA CHF Bipolar disorder HLD Seizures  LOS: 24 days    Autumn Messing III 01/04/2022

## 2022-01-05 DIAGNOSIS — K668 Other specified disorders of peritoneum: Secondary | ICD-10-CM | POA: Diagnosis not present

## 2022-01-05 LAB — GLUCOSE, CAPILLARY
Glucose-Capillary: 166 mg/dL — ABNORMAL HIGH (ref 70–99)
Glucose-Capillary: 139 mg/dL — ABNORMAL HIGH (ref 70–99)

## 2022-01-05 MED ORDER — OXYCODONE HCL 5 MG PO TABS: 5.0000 mg | ORAL_TABLET | ORAL | 0 refills | Status: DC | PRN

## 2022-01-05 NOTE — Progress Notes (Signed)
RA DL PICC removed per protocol per MD order. Manual pressure applied for 5 mins. Vaseline gauze, gauze, and Tegaderm applied over insertion site. No bleeding or swelling noted. Instructed patient to remain in bed for thirty mins. Educated patient about S/S of infection and when to call MD; no heavy lifting or pressure on right side for 24 hours; keep dressing dry and intact for 24 hours. Pt verbalized comprehension. 

## 2022-01-05 NOTE — Progress Notes (Signed)
Instructed patient and family member on flushing drain with normal saline, verbalized understanding. Discharge instructions reviewed with patient and family, verbalized understanding. To be transported to private vehicle via wheelchair.

## 2022-01-05 NOTE — TOC Transition Note (Signed)
Transition of Care [facility]) - CM/SW Discharge Note   Patient Details  Name: [name] MRN: [id number] Date of Birth: 10/07/1970  [facility] of [facility] CM/SW Contact:  Tom-Johnson, Renea Ee, RN Phone Number: 01/05/2022, 11:37 AM   Clinical Narrative:     Patient is scheduled for discharge today. Completed 22 days os IV Zosyn, off abx at this time. Discharging home with 1 LLQ drain with feculent output, outpatient f/u on AVS. Home health info on AVS, RW at bedside. Patient denies any other needs. Sister in-law to transport at discharge. No further TOC needs noted.      Final next level of care: [place] Barriers to Discharge: Barriers Resolved   Patient Goals and CMS Choice Patient states their goals for this hospitalization and ongoing recovery are:: To return home CMS Medicare.gov Compare Post Acute Care list provided to:: Patient Choice offered to / list presented to : Patient  Discharge Placement                Patient to be transferred to facility by: Sister in-law      Discharge Plan and Services   Discharge Planning Services: CM Consult Post Acute Care Choice: Home Health          DME Arranged: [place] rolling DME Agency: [employer] Date DME Agency Contacted: 12/13/21 Time DME Agency Contacted: [id number] Representative spoke with at DME Agency: Jodell Cipro HH Arranged: PT, RN [place] Agency: [place] Date [place]: 12/13/21 Time [place]: [phone] Representative spoke with at [place]: Tommi Rumps  Social Determinants of Health ([place]) Interventions Transportation Interventions: Intervention Not Indicated, Patient Resources (Friends/Family)   Readmission Risk Interventions    05/17/2019    9:25 AM  Readmission Risk Prevention Plan  Post Dischage Appt Complete  Medication Screening Complete  Transportation Screening Complete

## 2022-01-05 NOTE — Discharge Summary (Signed)
Physician Discharge Summary  [name] YQM:[id number] DOB: 07-Feb-1970 DOA: 12/11/2021  PCP: Martinique, Betty G, MD  Admit date: 12/11/2021  Discharge date: 01/05/2022  Admitted From: Home.  Disposition:  Home Health Services.  Recommendations for Outpatient Follow-up:  Follow up with PCP in 1-2 weeks. Please obtain BMP/CBC in one week. Advised to follow-up with general surgery as scheduled. Patient is being discharged home with one left lower quadrant drain,  follow-up with IR as scheduled. Patient has completed antibiotics treatment for diverticulitis.  Home Health: Home PT/OT/RN Equipment/ Devices: LLQ drain  Discharge Condition: Stable CODE STATUS:Full code Diet recommendation: Heart Healthy  Brief Summary/ Hospital Course: This 51 years old female with PMH significant for obstructive sleep apnea / OHS on CPAP presented to our hospital on 12/11/2021 with abdominal pain. CT scan of the abdomen pelvis showed free gas and fluid.  Patient was seen by general surgery and was taken emergently to the OR.  There was finding of well-formed abscess cavity in the lower mid abdomen near the sigmoid colon with purulent peritonitis and fibrinous exudate throughout the abdomen. Findings were suspected secondary to perforated sigmoid diverticulitis. She had laparoscopic peritoneal lavage with drains placed. Patient was intubated postoperatively and was initially in the ICU and was subsequently was transferred to our service after successful extubation. During hospitalization, general surgery has followed the patient.  Patient has multiple abdominal drains placed by general surgery and IR.  General surgery following and has advanced on diet.  Patient was on Zosyn and fluconazole during hospitalization and the decision has been to discontinue 01/22/2022.  She has completed 22 days of Zosyn treatment.  TPN  was discontinued.. IR to assist patient to see if further drains can be removed.  All the  drains are removed except 1 left lower quadrant drain.  Patient has participated in physical therapy Home health services arranged.  Patient is being discharged home.  Discharge Diagnoses:  Principal Problem:   Pneumoperitoneum Active Problems:   Morbid obesity ([place])   Hyperlipidemia   Type 2 diabetes mellitus with other specified complication (HCC)   OSA on CPAP   Essential hypertension, benign   Bipolar I disorder (HCC)   Anxiety state   Chronic combined systolic and diastolic CHF (congestive heart failure) (HCC)   Colitis   Lactic acidosis   Perforation of sigmoid colon due to diverticulitis  Perforated sigmoid diverticulitis with peritonitis and abscess: S/p Diagnostic Laparoscopic Washout with drain placement. Status post IR guided multiple drain placement. Continued on Zosyn and fluconazole. CT scan of the abdomen on 01/01/2022 showed improvement in the abdominal collection.   As per general surgery antibiotics discontinued.  Completed 22-day of Zosyn. IR to see if the further drains could be removed.  Patient discharged with left lower quadrant percutaneous drain.   OSA/OHS: Encourage patient to wear CPAP.   Acute kidney injury: Resolved.  Serum creatinine back to baseline.   Hyperkalemia: Improved.  Latest potassium of 4.3   Chronic HFp EF: Appears compensated at this time. Echo from Feb 2022 with improved EF 60-65%, G1DD, Lasix on hold.    Follows up with cardiology as outpatient.   Seizures, bipolar disorder, depression Continue alprazolam, Lyrica and vraylar.     History of GERD:  Continue pantoprazole.   Morbid obesity. Body mass index is 51.57 kg/m.   Would benefit from weight loss as outpatient.    Discharge Instructions  Discharge Instructions     Call MD for:  persistant dizziness or light-headedness   Complete  by: As directed    Call MD for:  persistant nausea and vomiting   Complete by: As directed    Diet - low sodium heart healthy    Complete by: As directed    Diet Carb Modified   Complete by: As directed    Discharge instructions   Complete by: As directed    Discharge wound care:   Complete by: As directed    Follow up with General surgery   Increase activity slowly   Complete by: As directed       Allergies as of 01/05/2022       Reactions   Savella [milnacipran Hcl] Other (See Comments)   mania        Medication List     STOP taking these medications    methocarbamol 750 MG tablet Commonly known as: Robaxin-750       TAKE these medications    Accu-Chek Aviva Plus w/Device Kit As directed.   acetaminophen 500 MG tablet Commonly known as: TYLENOL Take 1,000 mg by mouth every 6 (six) hours as needed for moderate pain.   albuterol 108 (90 Base) MCG/ACT inhaler Commonly known as: VENTOLIN HFA Inhale 2 puffs into the lungs every 6 (six) hours as needed for wheezing or shortness of breath.   ALPRAZolam 1 MG tablet Commonly known as: XANAX Take 1 mg by mouth 3 (three) times daily.   amphetamine-dextroamphetamine 20 MG tablet Commonly known as: ADDERALL Take 20 mg by mouth in the morning, at noon, in the evening, and at bedtime.   ascorbic acid 500 MG tablet Commonly known as: VITAMIN C Take 1,000 mg by mouth daily.   aspirin 81 MG tablet Take 1 tablet (81 mg total) by mouth daily.   atorvastatin 20 MG tablet Commonly known as: LIPITOR TAKE 1 TABLET BY MOUTH EVERY DAY IN THE EVENING What changed:  how much to take how to take this when to take this   b complex vitamins tablet Take 1 tablet by mouth daily.   Chromium Picolinate 500 MCG Tabs Take 500 mcg by mouth daily.   CITRACAL PO Take 2 tablets by mouth daily.   diclofenac Sodium 1 % Gel Commonly known as: VOLTAREN Apply 2 g topically 3 (three) times daily as needed (knee pain).   Fish Oil 1200 MG Caps Take 2,400 mg by mouth daily.   furosemide 40 MG tablet Commonly known as: LASIX Take 2 tablets (80 mg total)  by mouth 2 (two) times daily. What changed:  how much to take when to take this additional instructions   [name] 1.5/30 1.5-30 MG-MCG tablet Generic drug: Norethindrone Acetate-Ethinyl Estradiol Take 1 tablet by mouth at bedtime.   levalbuterol 45 MCG/ACT inhaler Commonly known as: XOPENEX HFA Inhale 2 puffs into the lungs every 6 (six) hours as needed for wheezing.   lidocaine 5 % Commonly known as: LIDODERM PLACE 1 PATCH ONTO THE SKIN DAILY AS NEEDED FOR KNEE PAIN. REMOVE AND DISCARD PATCH WITHIN 12 HOURS OR AS DIRECTED BY DOCTOR. What changed: See the new instructions.   losartan 25 MG tablet Commonly known as: COZAAR Take 1 tablet (25 mg total) by mouth 2 (two) times daily.   Lyrica 300 MG capsule Generic drug: pregabalin Take 300 mg by mouth 2 (two) times daily.   MAGNESIUM PO Take 500 mg by mouth daily.   multivitamin tablet Take 1 tablet by mouth daily.   Nuedexta 20-10 MG capsule Generic drug: Dextromethorphan-quiNIDine Take 1 capsule by mouth every 12 (twelve)  hours.   oxyCODONE 5 MG immediate release tablet Commonly known as: Oxy IR/ROXICODONE Take 1 tablet (5 mg total) by mouth every 4 (four) hours as needed for moderate pain.   potassium chloride SA 20 MEQ tablet Commonly known as: Klor-Con M20 Take 1 tablet (20 mEq total) by mouth daily.   Vitamin D3 125 MCG (5000 UT) Tabs Take 10,000 Units by mouth in the morning and at bedtime.   Vraylar 6 MG Caps Generic drug: Cariprazine HCl Take 6 mg by mouth every evening.   Zinc 50 MG Caps Take 50 mg by mouth in the morning and at bedtime.               Durable Medical Equipment  (From admission, onward)           Start     Ordered   12/13/21 1515  For home use only DME Walker rolling  Once       Question Answer Comment  Walker: With [place] Wheels   Patient needs a walker to treat with the following condition Gait instability      12/13/21 1514              Discharge Care  Instructions  (From admission, onward)           Start     Ordered   01/05/22 0000  Discharge wound care:       Comments: Follow up with General surgery   01/05/22 1039            Follow-up Information     [facility], [facility] Follow up.   Specialty: Home Health Services Why: Someone will call you to schedule first home visit. Contact information: [place] [address] [phone]         Stechschulte, Nickola Major, MD Follow up on 01/31/2022.   Specialty: Surgery Why: 1:40 pm, Arrive 30 minutes prior to your appointment time, Please bring your insurance card and photo ID Contact information: [address] [address] [phone]         [facility] Follow up.   Why: IR scheduler will call you to set up a follow up appointment for your drain (typically about 2 weeks after discharge). Please call [phone] with questions or concerns prior to your appointment. Contact information: [place] [address] [phone]         Martinique, Betty G, MD Follow up in 1 week(s).   Specialty: Family Medicine Contact information: [place] [address] [phone]                Allergies  Allergen Reactions   Elwyn Reach Hcl] Other (See Comments)    mania    Consultations: General surgery  IR   Procedures/Studies: IR Sinus/Fist Tube Chk-Non GI  Result Date: 01/03/2022 INDICATION: Postop abscess drains, decreased output EXAM: FLUOROSCOPIC INJECTION OF THE 4 ABDOMINAL ABSCESS DRAINS MEDICATIONS: The patient is currently admitted to the hospital and receiving intravenous antibiotics. The antibiotics were administered within an appropriate time frame prior to the initiation of the procedure. ANESTHESIA/SEDATION: None. COMPLICATIONS: None immediate. PROCEDURE: Informed written consent was obtained from the patient after a  thorough discussion of the procedural risks, benefits and alternatives. All questions were addressed. Maximal Sterile Barrier Technique was utilized including caps, mask, sterile gowns, sterile gloves, sterile drape, hand hygiene and skin antiseptic. A timeout was performed prior to  the initiation of the procedure. Patient has a total of 4 percutaneous drains, 2 on each side. Injection of the 2 right-sided drains initially performed. Collapsed abscess cavities. Negative for fistula. No significant residual fluid by CT at these 2 drain catheter sites. Both drains were removed. Injection of the 2 left-sided drains. Left superior drain has a small collapsed abscess. Negative for fistula. No significant residual fluid at this catheter site by CT. Drain removed. 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 This correlates with the fecal contaminated output. Residual abscess cavity remains. This drain will remain to gravity drainage. IMPRESSION: The 2 right-sided drains and left superior drain are negative for fistula with no significant residual abscess by CT. These 3 drains were removed. 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 Drain will remain to gravity drainage. Electronically Signed   By: Jerilynn Mages.  Shick M.D.   On: 01/03/2022 11:30   IR Sinus/Fist Tube Chk-Non GI  Result Date: 01/03/2022 INDICATION: Postop abscess drains, decreased output EXAM: FLUOROSCOPIC INJECTION OF THE 4 ABDOMINAL ABSCESS DRAINS MEDICATIONS: The patient is currently admitted to the hospital and receiving intravenous antibiotics. The antibiotics were administered within an appropriate time frame prior to the initiation of the procedure. ANESTHESIA/SEDATION: None. COMPLICATIONS: None immediate. PROCEDURE: Informed written consent was obtained from the patient  after a thorough discussion of the procedural risks, benefits and alternatives. All questions were addressed. Maximal Sterile Barrier Technique was utilized including caps, mask, sterile gowns, sterile gloves, sterile drape, hand hygiene and skin antiseptic. A timeout was performed prior to the initiation of the procedure. Patient has a total of 4 percutaneous drains, 2 on each side. Injection of the 2 right-sided drains initially performed. Collapsed abscess cavities. Negative for fistula. No significant residual fluid by CT at these 2 drain catheter sites. Both drains were removed. Injection of the 2 left-sided drains. Left superior drain has a small collapsed abscess. Negative for fistula. No significant residual fluid at this catheter site by CT. Drain removed. 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 This correlates with the fecal contaminated output. Residual abscess cavity remains. This drain will remain to gravity drainage. IMPRESSION: The 2 right-sided drains and left superior drain are negative for fistula with no significant residual abscess by CT. These 3 drains were removed. 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 Drain will remain to gravity drainage. Electronically Signed   By: Jerilynn Mages.  Shick M.D.   On: 01/03/2022 11:30   IR Sinus/Fist Tube Chk-Non GI  Result Date: 01/03/2022 INDICATION: Postop abscess drains, decreased output EXAM: FLUOROSCOPIC INJECTION OF THE 4 ABDOMINAL ABSCESS DRAINS MEDICATIONS: The patient is currently admitted to the hospital and receiving intravenous antibiotics. The antibiotics were administered within an appropriate time frame prior to the initiation of the procedure. ANESTHESIA/SEDATION: None. COMPLICATIONS: None immediate. PROCEDURE: Informed written consent was obtained from the  patient after a thorough discussion of the procedural risks, benefits and alternatives. All questions were addressed. Maximal Sterile Barrier Technique was utilized including caps, mask, sterile gowns, sterile gloves, sterile drape, hand hygiene and skin antiseptic. A timeout was performed prior to the initiation of the procedure. Patient has a total of 4 percutaneous drains, 2 on each side. Injection  of the 2 right-sided drains initially performed. Collapsed abscess cavities. Negative for fistula. No significant residual fluid by CT at these 2 drain catheter sites. Both drains were removed. Injection of the 2 left-sided drains. Left superior drain has a small collapsed abscess. Negative for fistula. No significant residual fluid at this catheter site by CT. Drain removed. 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 This correlates with the fecal contaminated output. Residual abscess cavity remains. This drain will remain to gravity drainage. IMPRESSION: The 2 right-sided drains and left superior drain are negative for fistula with no significant residual abscess by CT. These 3 drains were removed. 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 Drain will remain to gravity drainage. Electronically Signed   By: Jerilynn Mages.  Shick M.D.   On: 01/03/2022 11:30   IR Sinus/Fist Tube Chk-Non GI  Result Date: 01/03/2022 INDICATION: Postop abscess drains, decreased output EXAM: FLUOROSCOPIC INJECTION OF THE 4 ABDOMINAL ABSCESS DRAINS MEDICATIONS: The patient is currently admitted to the hospital and receiving intravenous antibiotics. The antibiotics were administered within an appropriate time frame prior to the initiation of the procedure. ANESTHESIA/SEDATION: None. COMPLICATIONS: None immediate. PROCEDURE: Informed written consent was obtained from  the patient after a thorough discussion of the procedural risks, benefits and alternatives. All questions were addressed. Maximal Sterile Barrier Technique was utilized including caps, mask, sterile gowns, sterile gloves, sterile drape, hand hygiene and skin antiseptic. A timeout was performed prior to the initiation of the procedure. Patient has a total of 4 percutaneous drains, 2 on each side. Injection of the 2 right-sided drains initially performed. Collapsed abscess cavities. Negative for fistula. No significant residual fluid by CT at these 2 drain catheter sites. Both drains were removed. Injection of the 2 left-sided drains. Left superior drain has a small collapsed abscess. Negative for fistula. No significant residual fluid at this catheter site by CT. Drain removed. 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 This correlates with the fecal contaminated output. Residual abscess cavity remains. This drain will remain to gravity drainage. IMPRESSION: The 2 right-sided drains and left superior drain are negative for fistula with no significant residual abscess by CT. These 3 drains were removed. 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 Drain will remain to gravity drainage. Electronically Signed   By: Jerilynn Mages.  Shick M.D.   On: 01/03/2022 11:30   CT ABDOMEN PELVIS W CONTRAST  Result Date: 01/01/2022 CLINICAL DATA:  Follow-up intra-abdominal abscess EXAM: CT ABDOMEN AND PELVIS WITH CONTRAST TECHNIQUE: Multidetector CT imaging of the abdomen and pelvis was performed using the standard protocol following bolus administration of intravenous contrast. RADIATION DOSE REDUCTION: This exam was performed according to the departmental dose-optimization program which includes automated exposure control, adjustment of the mA and/or kV  according to patient size and/or use of iterative reconstruction technique. CONTRAST:  73m OMNIPAQUE IOHEXOL 350 MG/ML SOLN COMPARISON:  CT AP, 01/23/2022 and 12/19/2021.  IR CT, 12/19/2021. FINDINGS: Lower chest: Small volume RIGHT pleural effusion with adjacent dependent atelectasis. LEFT basilar pulmonary cysts and minimal atelectasis. Hepatobiliary: No focal liver abnormality. Single sub-1 cm dependent gallstone at the gallbladder neck. Nondistended gallbladder. No gallbladder wall thickening, or biliary dilatation. Pancreas: No pancreatic ductal dilatation or  surrounding inflammatory changes. Spleen: No splenic injury or perisplenic hematoma. Adrenals/Urinary Tract: Adrenal glands are unremarkable. Sub-1 cm RIGHT superior renal pole renal cortical hypodense lesion, too small to adequately characterize though likely a small cyst. No follow-up indicated. Kidneys are otherwise normal, without renal calculi or hydronephrosis. Bladder is unremarkable. Stomach/Bowel: Stomach is within normal limits. Appendix is not definitively visualized. No evidence of bowel wall thickening, distention, or inflammatory changes. Vascular/Lymphatic: Moderate burden of aortic atherosclerosis. No aneurysmal dilatation. No enlarged abdominal or pelvic lymph nodes. Reproductive: Uterus and adnexa are unremarkable. Other: *Obese with rotund abdomen.  No abdominal wall hernia. *Mild body wall edema. Anterior abdominal wall contusions, likely surgical port and additional injection sites. *Layering air along the LEFT lateral abdominal wall fascia and additional tracking gas along a catheter, likely secondary to flushes. *Mild omental thickening without discrete nodularity, likely edema. *Trace layering fluid along the pericolic gutters and dependent pelvis. No abdominopelvic ascites. *Minimal interval decrease in size of air-and-fluid containing retrouterine/deep pelvic abscess, measuring 3.5 cm (previously 4.0 cm) in greatest dimension.  *Well-positioned RIGHT lower quadrant percutaneous drainage catheter with resolution of previously demonstrated abscess collection. *Well-positioned LEFT lower quadrant/pelvic percutaneous drainage catheter with resolution of previously demonstrated abscess collection. *Well-positioned RIGHT upper quadrant/inferior perihepatic percutaneous drainage catheter with marked decreased size of previously-demonstrated abscess, now measuring 4.4 x 2.3 cm (previously 6.8 x 3.5 cm). *Well-positioned LEFT upper quadrant percutaneous drainage catheter with resolution of previously-demonstrated abscess collection. *Stable positioning of RIGHT anterolateral approach surgically placed Blake drain, coursing along the RIGHT pericolic gutter and tip within the pelvis anterior to urinary bladder. *Interval removal of additional LEFT anterolateral approach surgically placed Blake drain. Musculoskeletal: No acute osseous findings. IMPRESSION: Since CT AP dated 12/24/2021; 1. Well-positioned percutaneous drainage catheters, as described in detail above, with resolution or marked decrease in size of intra-abdominal abscesses. 2. Persistent though interval decrease in size of now 3.5 cm deep pelvic abscess. 3. Small volume RIGHT pleural effusion. 4. Cholelithiasis. No additional CT findings to suggest acute cholecystitis. Additional incidental, chronic and senescent findings as above. Electronically Signed   By: Michaelle Birks M.D.   On: 01/01/2022 13:41   DG CHEST PORT 1 VIEW  Result Date: 12/28/2021 CLINICAL DATA:  Dyspnea EXAM: PORTABLE CHEST 1 VIEW COMPARISON:  Prior chest x-ray 05/03/2015 FINDINGS: Mild cardiomegaly. No pulmonary edema. Streaky bibasilar airspace opacities are present and are favored to reflect atelectasis. No focal airspace infiltrate. Right upper extremity PICC is present. The tip of the PICC overlies the innominate vein on the right. No pneumothorax or pleural effusion. No acute osseous abnormality. Surgical  changes of prior posterior cervical fusion. IMPRESSION: 1. Right upper extremity PICC with the tip overlying the innominate vein. 2. Stable mild cardiomegaly. 3. Streaky bibasilar airspace opacities favored to reflect atelectasis. Electronically Signed   By: Jacqulynn Cadet M.D.   On: 12/28/2021 13:10   [place] EKG Site Rite  Result Date: 12/28/2021 If Site Rite image not attached, placement could not be confirmed due to current cardiac rhythm.  CT ABDOMEN PELVIS W CONTRAST  Result Date: 12/24/2021 CLINICAL DATA:  Abdominal pain postop EXAM: CT ABDOMEN AND PELVIS WITH CONTRAST TECHNIQUE: Multidetector CT imaging of the abdomen and pelvis was performed using the standard protocol following bolus administration of intravenous contrast. RADIATION DOSE REDUCTION: This exam was performed according to the departmental dose-optimization program which includes automated exposure control, adjustment of the mA and/or kV according to patient size and/or use of iterative reconstruction technique. CONTRAST:  124m OMNIPAQUE IOHEXOL 350  MG/ML SOLN COMPARISON:  12/18/2021 FINDINGS: Lower chest: New small right pleural effusion. Dependent atelectasis/consolidation of the lung bases right greater than left. Hepatobiliary: Gallbladder is physiologically distended with at least 1 5 mm calcified stone in its dependent aspect. No focal liver lesion or biliary ductal dilatation. Pancreas: Unremarkable. No pancreatic ductal dilatation or surrounding inflammatory changes. Spleen: Normal in size without focal abnormality. Adrenals/Urinary Tract: Adrenal glands are unremarkable. Kidneys are normal, without renal calculi, focal lesion, or hydronephrosis. Bladder is unremarkable. Stomach/Bowel: Stomach is physiologically distended, unremarkable. Small bowel is decompressed with good distal passage of oral contrast material. Appendix not identified. The colon is nondilated, unremarkable. Vascular/Lymphatic: Mild scattered aortoiliac  calcified atheromatous plaque without aneurysm. No abdominal or pelvic adenopathy. Reproductive: Uterus and bilateral adnexa are unremarkable. Other: Stable left upper abdominal surgical drain terminating inferior to the left lobe of the liver. Left parasagittal surgical drain extends to the left lower quadrant as before. Right mid abdominal surgical drain extends to the anterior pelvis as before. Interval pigtail drain placement in right infrahepatic fluid collection with significant decompression, residual gas and fluid pocket 4.8 x 1.5 cm. Interval pigtail drain placement into left lower quadrant collection with significant decompression, 1.5 cm residual gas and fluid pocket. Interval pigtail drain placement into left pelvic fluid collection, with significant decompression, 1.6 cm gas and fluid pocket medial to the drain. Interval pigtail drain placement into the right pelvic gas and fluid collection, virtually completely evacuated. Persistent 4 cm gas and fluid collection posterior to the uterus, previously 6.2 cm. There is linear gas in the left lateral abdominal wall musculature, new since previous. Musculoskeletal: Bilateral hip DJD. Bilateral sacroiliitis. Spondylitic changes throughout the lower thoracic and lumbar spine. No acute findings. IMPRESSION: 1. Interval pigtail drain placements into right infrahepatic, left lower quadrant, left pelvic, and right pelvic collections with significant decompression. 2. Persistent 4 cm gas and fluid collection posterior to the uterus, previously 6.2 cm. 3. New small right pleural effusion with bibasilar atelectasis/consolidation. 4. Cholelithiasis. Aortic Atherosclerosis (ICD10-I70.0). Electronically Signed   By: Lucrezia Europe M.D.   On: 12/24/2021 20:09   [place] EKG SITE RITE  Result Date: 12/23/2021 If Site Rite image not attached, placement could not be confirmed due to current cardiac rhythm.  CT GUIDED PERITONEAL/RETROPERITONEAL FLUID DRAIN BY PERC CATH  Result  Date: 12/20/2021 INDICATION: History of perforated abdominal viscus, post laparotomy on 12/11/2021 (Dr. Thermon Leyland) with postprocedural CT scan demonstrated development intraluminal fluid collections. As such, patient presents now for CT-guided aspiration and/or percutaneous drainage catheter placement. EXAM: CT-GUIDED PERCUTANEOUS CATHETER PLACEMENT X4 COMPARISON:  CT abdomen and pelvis-12/18/2021; 12/11/2021 MEDICATIONS: Zofran 4 mg IV; the patient is currently admitted to the hospital and receiving intravenous antibiotics. The antibiotics were administered within an appropriate time frame prior to the initiation of the procedure. ANESTHESIA/SEDATION: Moderate (conscious) sedation was employed during this procedure. A total of Versed 2 mg and Fentanyl 200 mcg was administered intravenously. Moderate Sedation Time: 70 minutes. The patient's level of consciousness and vital signs were monitored continuously by radiology nursing throughout the procedure under my direct supervision. CONTRAST:  None COMPLICATIONS: None immediate. PROCEDURE: RADIATION DOSE REDUCTION: This exam was performed according to the departmental dose-optimization program which includes automated exposure control, adjustment of the mA and/or kV according to patient size and/or use of iterative reconstruction technique. Informed written consent was obtained from the patient after a discussion of the risks, benefits and alternatives to treatment. The patient was placed supine on the CT gantry and a pre procedural  CT was performed re-demonstrating the known abscesses/fluid collections within the abdomen with dominant collection within the right upper abdominal quadrant measuring a proximally 6.8 x 3.5 cm (image 47, series 2), dominant collection within the left mid abdomen measuring 6.1 x 3.0 cm (59, series 2), dominant collection within the left lower abdomen/pelvis noted to contain enteric contrast and measuring approximately 8.5 x 5.4 cm  (image 74, series 2) and dominant collection with the right lower abdominal quadrant measuring 9.8 x 6.5 cm (image 76, series 2. CT gantry table positions were marked and the procedure was planned. A timeout was performed prior to the initiation of the procedure. The abdomen was prepped and draped in the usual sterile fashion. The overlying soft tissues were anesthetized with 1% lidocaine with epinephrine. Under intermittent CT guidance, each collection was targeted with an 18 gauge trocar needle ultimately allowing coiling of a short Amplatz wire within all collections. Appropriate position was confirmed with CT imaging. Next, tracks were serially dilated allowing placement of 10 French drainage catheters into the smaller collections within the right upper and left mid abdomen and 12 French drainage catheters into both the right and left lower abdominal/pelvic collections. At this time, approximately 300 cc of similar appearing feculent, foul-smelling fluid was aspirated from all of the collections with the majority of infection being aspirated from the 2 lower abdominal/pelvic drainage catheters, right-greater-than-left. All drainage catheters were flushed with saline and connected to gravity bags. All drainage catheters were secured at the skin entrance site within interrupted sutures. Dressings were applied. The patient tolerated the above procedures well without immediate postprocedural complication. IMPRESSION: 1. Successful placement of a 10 French drainage catheter into the right upper and left mid abdominal abscesses. 2. Successful placement of 12 French drainage catheters into right and left lower abdominal/pelvic abscesses. Enteric contrast is seen within the left lower abdominal/pelvic collection suggesting the adjacent sigmoid colon likely serves as the etiology of the patient's enteric perforation. 3. A total of 300 cc of similar appearing feculent, foul-smelling fluid was aspirated from all drainage  catheters with the majority of infection being aspirated from the two lower abdominal/pelvic drainage catheters, right-greater-than-left. Electronically Signed   By: Sandi Mariscal M.D.   On: 12/20/2021 14:55   CT GUIDED PERITONEAL/RETROPERITONEAL FLUID DRAIN BY PERC CATH  Result Date: 12/20/2021 INDICATION: History of perforated abdominal viscus, post laparotomy on 12/11/2021 (Dr. Thermon Leyland) with postprocedural CT scan demonstrated development intraluminal fluid collections. As such, patient presents now for CT-guided aspiration and/or percutaneous drainage catheter placement. EXAM: CT-GUIDED PERCUTANEOUS CATHETER PLACEMENT X4 COMPARISON:  CT abdomen and pelvis-12/18/2021; 12/11/2021 MEDICATIONS: Zofran 4 mg IV; the patient is currently admitted to the hospital and receiving intravenous antibiotics. The antibiotics were administered within an appropriate time frame prior to the initiation of the procedure. ANESTHESIA/SEDATION: Moderate (conscious) sedation was employed during this procedure. A total of Versed 2 mg and Fentanyl 200 mcg was administered intravenously. Moderate Sedation Time: 70 minutes. The patient's level of consciousness and vital signs were monitored continuously by radiology nursing throughout the procedure under my direct supervision. CONTRAST:  None COMPLICATIONS: None immediate. PROCEDURE: RADIATION DOSE REDUCTION: This exam was performed according to the departmental dose-optimization program which includes automated exposure control, adjustment of the mA and/or kV according to patient size and/or use of iterative reconstruction technique. Informed written consent was obtained from the patient after a discussion of the risks, benefits and alternatives to treatment. The patient was placed supine on the CT gantry and a pre procedural CT was performed  re-demonstrating the known abscesses/fluid collections within the abdomen with dominant collection within the right upper abdominal quadrant  measuring a proximally 6.8 x 3.5 cm (image 47, series 2), dominant collection within the left mid abdomen measuring 6.1 x 3.0 cm (59, series 2), dominant collection within the left lower abdomen/pelvis noted to contain enteric contrast and measuring approximately 8.5 x 5.4 cm (image 74, series 2) and dominant collection with the right lower abdominal quadrant measuring 9.8 x 6.5 cm (image 76, series 2. CT gantry table positions were marked and the procedure was planned. A timeout was performed prior to the initiation of the procedure. The abdomen was prepped and draped in the usual sterile fashion. The overlying soft tissues were anesthetized with 1% lidocaine with epinephrine. Under intermittent CT guidance, each collection was targeted with an 18 gauge trocar needle ultimately allowing coiling of a short Amplatz wire within all collections. Appropriate position was confirmed with CT imaging. Next, tracks were serially dilated allowing placement of 10 French drainage catheters into the smaller collections within the right upper and left mid abdomen and 12 French drainage catheters into both the right and left lower abdominal/pelvic collections. At this time, approximately 300 cc of similar appearing feculent, foul-smelling fluid was aspirated from all of the collections with the majority of infection being aspirated from the 2 lower abdominal/pelvic drainage catheters, right-greater-than-left. All drainage catheters were flushed with saline and connected to gravity bags. All drainage catheters were secured at the skin entrance site within interrupted sutures. Dressings were applied. The patient tolerated the above procedures well without immediate postprocedural complication. IMPRESSION: 1. Successful placement of a 10 French drainage catheter into the right upper and left mid abdominal abscesses. 2. Successful placement of 12 French drainage catheters into right and left lower abdominal/pelvic abscesses. Enteric  contrast is seen within the left lower abdominal/pelvic collection suggesting the adjacent sigmoid colon likely serves as the etiology of the patient's enteric perforation. 3. A total of 300 cc of similar appearing feculent, foul-smelling fluid was aspirated from all drainage catheters with the majority of infection being aspirated from the two lower abdominal/pelvic drainage catheters, right-greater-than-left. Electronically Signed   By: Sandi Mariscal M.D.   On: 12/20/2021 14:55   CT GUIDED PERITONEAL/RETROPERITONEAL FLUID DRAIN BY PERC CATH  Result Date: 12/20/2021 INDICATION: History of perforated abdominal viscus, post laparotomy on 12/11/2021 (Dr. Thermon Leyland) with postprocedural CT scan demonstrated development intraluminal fluid collections. As such, patient presents now for CT-guided aspiration and/or percutaneous drainage catheter placement. EXAM: CT-GUIDED PERCUTANEOUS CATHETER PLACEMENT X4 COMPARISON:  CT abdomen and pelvis-12/18/2021; 12/11/2021 MEDICATIONS: Zofran 4 mg IV; the patient is currently admitted to the hospital and receiving intravenous antibiotics. The antibiotics were administered within an appropriate time frame prior to the initiation of the procedure. ANESTHESIA/SEDATION: Moderate (conscious) sedation was employed during this procedure. A total of Versed 2 mg and Fentanyl 200 mcg was administered intravenously. Moderate Sedation Time: 70 minutes. The patient's level of consciousness and vital signs were monitored continuously by radiology nursing throughout the procedure under my direct supervision. CONTRAST:  None COMPLICATIONS: None immediate. PROCEDURE: RADIATION DOSE REDUCTION: This exam was performed according to the departmental dose-optimization program which includes automated exposure control, adjustment of the mA and/or kV according to patient size and/or use of iterative reconstruction technique. Informed written consent was obtained from the patient after a discussion of  the risks, benefits and alternatives to treatment. The patient was placed supine on the CT gantry and a pre procedural CT was performed re-demonstrating the  known abscesses/fluid collections within the abdomen with dominant collection within the right upper abdominal quadrant measuring a proximally 6.8 x 3.5 cm (image 47, series 2), dominant collection within the left mid abdomen measuring 6.1 x 3.0 cm (59, series 2), dominant collection within the left lower abdomen/pelvis noted to contain enteric contrast and measuring approximately 8.5 x 5.4 cm (image 74, series 2) and dominant collection with the right lower abdominal quadrant measuring 9.8 x 6.5 cm (image 76, series 2. CT gantry table positions were marked and the procedure was planned. A timeout was performed prior to the initiation of the procedure. The abdomen was prepped and draped in the usual sterile fashion. The overlying soft tissues were anesthetized with 1% lidocaine with epinephrine. Under intermittent CT guidance, each collection was targeted with an 18 gauge trocar needle ultimately allowing coiling of a short Amplatz wire within all collections. Appropriate position was confirmed with CT imaging. Next, tracks were serially dilated allowing placement of 10 French drainage catheters into the smaller collections within the right upper and left mid abdomen and 12 French drainage catheters into both the right and left lower abdominal/pelvic collections. At this time, approximately 300 cc of similar appearing feculent, foul-smelling fluid was aspirated from all of the collections with the majority of infection being aspirated from the 2 lower abdominal/pelvic drainage catheters, right-greater-than-left. All drainage catheters were flushed with saline and connected to gravity bags. All drainage catheters were secured at the skin entrance site within interrupted sutures. Dressings were applied. The patient tolerated the above procedures well without  immediate postprocedural complication. IMPRESSION: 1. Successful placement of a 10 French drainage catheter into the right upper and left mid abdominal abscesses. 2. Successful placement of 12 French drainage catheters into right and left lower abdominal/pelvic abscesses. Enteric contrast is seen within the left lower abdominal/pelvic collection suggesting the adjacent sigmoid colon likely serves as the etiology of the patient's enteric perforation. 3. A total of 300 cc of similar appearing feculent, foul-smelling fluid was aspirated from all drainage catheters with the majority of infection being aspirated from the two lower abdominal/pelvic drainage catheters, right-greater-than-left. Electronically Signed   By: Sandi Mariscal M.D.   On: 12/20/2021 14:55   CT GUIDED PERITONEAL/RETROPERITONEAL FLUID DRAIN BY PERC CATH  Result Date: 12/20/2021 INDICATION: History of perforated abdominal viscus, post laparotomy on 12/11/2021 (Dr. Thermon Leyland) with postprocedural CT scan demonstrated development intraluminal fluid collections. As such, patient presents now for CT-guided aspiration and/or percutaneous drainage catheter placement. EXAM: CT-GUIDED PERCUTANEOUS CATHETER PLACEMENT X4 COMPARISON:  CT abdomen and pelvis-12/18/2021; 12/11/2021 MEDICATIONS: Zofran 4 mg IV; the patient is currently admitted to the hospital and receiving intravenous antibiotics. The antibiotics were administered within an appropriate time frame prior to the initiation of the procedure. ANESTHESIA/SEDATION: Moderate (conscious) sedation was employed during this procedure. A total of Versed 2 mg and Fentanyl 200 mcg was administered intravenously. Moderate Sedation Time: 70 minutes. The patient's level of consciousness and vital signs were monitored continuously by radiology nursing throughout the procedure under my direct supervision. CONTRAST:  None COMPLICATIONS: None immediate. PROCEDURE: RADIATION DOSE REDUCTION: This exam was performed  according to the departmental dose-optimization program which includes automated exposure control, adjustment of the mA and/or kV according to patient size and/or use of iterative reconstruction technique. Informed written consent was obtained from the patient after a discussion of the risks, benefits and alternatives to treatment. The patient was placed supine on the CT gantry and a pre procedural CT was performed re-demonstrating the known abscesses/fluid collections  within the abdomen with dominant collection within the right upper abdominal quadrant measuring a proximally 6.8 x 3.5 cm (image 47, series 2), dominant collection within the left mid abdomen measuring 6.1 x 3.0 cm (59, series 2), dominant collection within the left lower abdomen/pelvis noted to contain enteric contrast and measuring approximately 8.5 x 5.4 cm (image 74, series 2) and dominant collection with the right lower abdominal quadrant measuring 9.8 x 6.5 cm (image 76, series 2. CT gantry table positions were marked and the procedure was planned. A timeout was performed prior to the initiation of the procedure. The abdomen was prepped and draped in the usual sterile fashion. The overlying soft tissues were anesthetized with 1% lidocaine with epinephrine. Under intermittent CT guidance, each collection was targeted with an 18 gauge trocar needle ultimately allowing coiling of a short Amplatz wire within all collections. Appropriate position was confirmed with CT imaging. Next, tracks were serially dilated allowing placement of 10 French drainage catheters into the smaller collections within the right upper and left mid abdomen and 12 French drainage catheters into both the right and left lower abdominal/pelvic collections. At this time, approximately 300 cc of similar appearing feculent, foul-smelling fluid was aspirated from all of the collections with the majority of infection being aspirated from the 2 lower abdominal/pelvic drainage  catheters, right-greater-than-left. All drainage catheters were flushed with saline and connected to gravity bags. All drainage catheters were secured at the skin entrance site within interrupted sutures. Dressings were applied. The patient tolerated the above procedures well without immediate postprocedural complication. IMPRESSION: 1. Successful placement of a 10 French drainage catheter into the right upper and left mid abdominal abscesses. 2. Successful placement of 12 French drainage catheters into right and left lower abdominal/pelvic abscesses. Enteric contrast is seen within the left lower abdominal/pelvic collection suggesting the adjacent sigmoid colon likely serves as the etiology of the patient's enteric perforation. 3. A total of 300 cc of similar appearing feculent, foul-smelling fluid was aspirated from all drainage catheters with the majority of infection being aspirated from the two lower abdominal/pelvic drainage catheters, right-greater-than-left. Electronically Signed   By: Sandi Mariscal M.D.   On: 12/20/2021 14:55   CT ABDOMEN PELVIS W CONTRAST  Result Date: 12/18/2021 CLINICAL DATA:  Postoperative abdominal pain, status post EXAM: CT ABDOMEN AND PELVIS WITH CONTRAST TECHNIQUE: Multidetector CT imaging of the abdomen and pelvis was performed using the standard protocol following bolus administration of intravenous contrast. RADIATION DOSE REDUCTION: This exam was performed according to the departmental dose-optimization program which includes automated exposure control, adjustment of the mA and/or kV according to patient size and/or use of iterative reconstruction technique. CONTRAST:  186m OMNIPAQUE IOHEXOL 350 MG/ML SOLN COMPARISON:  CTA chest abdomen pelvis, 12/11/2021 FINDINGS: Lower chest: Bandlike dependent bibasilar atelectasis. Hepatobiliary: No solid liver abnormality is seen. Tiny gallstone. No gallbladder wall thickening, or biliary dilatation. Pancreas: Unremarkable. No  pancreatic ductal dilatation or surrounding inflammatory changes. Spleen: Normal in size without significant abnormality. Adrenals/Urinary Tract: Adrenal glands are unremarkable. Kidneys are normal, without renal calculi, solid lesion, or hydronephrosis. Bladder is unremarkable. Stomach/Bowel: Stomach is within normal limits. Appendix appears normal. No evidence of bowel wall thickening, distention, or inflammatory changes. Sigmoid diverticulosis Vascular/Lymphatic: Aortic atherosclerosis. No enlarged abdominal or pelvic lymph nodes. Reproductive: No mass or other significant abnormality. Other: No abdominal wall hernia or abnormality. Small volume perihepatic ascites. Multiple loculated appearing air and fluid collections are again seen in the low abdomen and pelvis, most notably in the right lower  quadrant measuring 9.4 x 8.8 cm (series 15, image 80), low abdomen anterior to the uterus and adjacent to the sigmoid colon measuring 9.0 x 5.1 cm (series 15, image 79), in the pelvis interposed between the rectum and uterus measuring 6.2 x 5.0 cm (series 15, image 82), and in the left hemiabdomen adjacent to mid small bowel measuring 6.2 x 3.4 cm (series 15, image 63). Surgical drains are looped about the abdomen and pelvis, with tips positioned in the central low abdomen (series 15, image 86) and in the left upper quadrant (series 15, image 61). Musculoskeletal: No acute or significant osseous findings. IMPRESSION: 1. Multiple loculated air and fluid collections are again seen throughout the abdomen and pelvis, most consistent with multifocal intra-abdominal abscesses in the setting of perforated viscus. However note that the presence or absence of infection is not established by CT. 2. Surgical drains are looped about the abdomen and pelvis, with tips positioned in the central low abdomen and in the left upper quadrant. 3. Small volume perihepatic ascites. 4. Cholelithiasis. Aortic Atherosclerosis (ICD10-I70.0).  Electronically Signed   By: Delanna Ahmadi M.D.   On: 12/18/2021 16:55   DG Abd 1 View  Result Date: 12/11/2021 CLINICAL DATA:  Enteric catheter placement, recent laparoscopy EXAM: ABDOMEN - 1 VIEW COMPARISON:  12/11/2021 FINDINGS: Frontal view of the lower chest and upper abdomen demonstrates enteric catheter passing below diaphragm, tip overlying the gastric fundus. Surgical drains overlie the upper abdomen. No peritoneal consistent with recent surgical intervention. IMPRESSION: 1. Enteric catheter tip projecting over the gastric fundus. Electronically Signed   By: Randa Ngo M.D.   On: 12/11/2021 23:07   CT Angio Chest/Abd/Pel for Dissection W and/or Wo Contrast  Result Date: 12/11/2021 CLINICAL DATA:  Periumbilical abdominal pain and diarrhea since Friday, diaphoresis, near syncope EXAM: CT ANGIOGRAPHY CHEST, ABDOMEN AND PELVIS TECHNIQUE: Non-contrast CT of the chest was initially obtained. Multidetector CT imaging through the chest, abdomen and pelvis was performed using the standard protocol during bolus administration of intravenous contrast. Multiplanar reconstructed images and MIPs were obtained and reviewed to evaluate the vascular anatomy. RADIATION DOSE REDUCTION: This exam was performed according to the departmental dose-optimization program which includes automated exposure control, adjustment of the mA and/or kV according to patient size and/or use of iterative reconstruction technique. CONTRAST:  94m OMNIPAQUE IOHEXOL 350 MG/ML SOLN COMPARISON:  05/03/2015 FINDINGS: CTA CHEST FINDINGS Cardiovascular: Visualized portions of the heart and descending thoracic aorta are unremarkable. No pericardial effusion. No evidence of thoracic aortic aneurysm. No evidence of descending thoracic aortic dissection. Atherosclerosis of the aorta and coronary vasculature. Mediastinum/Nodes: No enlarged mediastinal, hilar, or axillary lymph nodes. Thyroid gland, trachea, and esophagus demonstrate no  significant findings. Lungs/Pleura: No acute airspace disease, effusion, or pneumothorax. Scattered emphysematous changes are seen throughout the lungs. Central airways are patent. Musculoskeletal: No acute or destructive bony lesions. Review of the MIP images confirms the above findings. CTA ABDOMEN AND PELVIS FINDINGS VASCULAR Aorta: Normal caliber aorta without aneurysm, dissection, vasculitis or significant stenosis. Mild atherosclerosis. Celiac: Patent without evidence of aneurysm, dissection, vasculitis or significant stenosis. SMA: Patent without evidence of aneurysm, dissection, vasculitis or significant stenosis. Mild atherosclerosis. Renals: Both renal arteries are patent without evidence of aneurysm, dissection, vasculitis, fibromuscular dysplasia or significant stenosis. Mild atherosclerosis. IMA: Patent without evidence of aneurysm, dissection, vasculitis or significant stenosis. Inflow: Patent without evidence of aneurysm, dissection, vasculitis or significant stenosis. Veins: No obvious venous abnormality within the limitations of this arterial phase study. Review of the MIP  images confirms the above findings. NON-VASCULAR Hepatobiliary: No focal liver abnormality is seen. No gallstones, gallbladder wall thickening, or biliary dilatation. Pancreas: Unremarkable. No pancreatic ductal dilatation or surrounding inflammatory changes. Spleen: Normal in size without focal abnormality. Adrenals/Urinary Tract: Adrenal glands are unremarkable. Kidneys are normal, without renal calculi, focal lesion, or hydronephrosis. The bladder is decompressed, limiting its evaluation. Stomach/Bowel: Multiple dilated loops of jejunum are seen within the central abdomen, measuring up to 3.9 cm, likely representing ileus. There is diffuse wall thickening of the rectosigmoid colon. There is free fluid and free gas within the lower pelvis and close proximity to the thickened sigmoid colon, with findings consistent with colitis  and sigmoid colon perforation. Evaluation of the bowel is limited without oral contrast. Lymphatic: No pathologic adenopathy within the abdomen or pelvis. Reproductive: Uterus is either atrophic or surgically absent. There are no adnexal masses. Other: Diffuse pneumoperitoneum is identified. There is a small amount of free fluid throughout the right hemiabdomen and lower pelvis. Gas/fluid collection within the central upper pelvis adjacent to the inflamed sigmoid colon measures 8.7 x 4.6 cm, consistent with a more contained area of perforation. There is no well-formed fluid collection or drainable abscess at this time. Musculoskeletal: No acute or destructive bony lesions. Reconstructed images demonstrate no additional findings. Review of the MIP images confirms the above findings. IMPRESSION: Vascular: 1. No evidence of aortic aneurysm or dissection. 2.  Aortic Atherosclerosis (ICD10-I70.0). Nonvascular: 1. Free fluid and free gas throughout the abdomen and pelvis as above, with localized gas/fluid collection adjacent to inflamed sigmoid colon within the central upper pelvis. Findings are consistent with sigmoid colitis and colonic perforation. No well-formed abscess or drainable fluid collection at this time. 2. Distended mid jejunum, likely related to ileus. Critical Value/emergent results were called by telephone at the time of interpretation on 12/11/2021 at 556 pm to provider Godfrey Pick , who verbally acknowledged these results. Electronically Signed   By: Randa Ngo M.D.   On: 12/11/2021 18:01     Subjective: Patient was seen and examined at bedside.  Overnight events noted.   Patient reports doing much better and wants to be discharged.  Home health services arranged.  Discharge Exam: Vitals:   01/05/22 0541 01/05/22 0850  BP: 103/67 123/79  Pulse: 95 92  Resp: 20   Temp: (!) 97.5 F (36.4 C) 97.9 F (36.6 C)  SpO2: 95% 91%   Vitals:   01/04/22 2021 01/05/22 0108 01/05/22 0541 01/05/22  0850  BP:  118/69 103/67 123/79  Pulse: 100 (!) 107 95 92  Resp: _0 Temp:  99.1 F (37.3 C) (!) 97.5 F (36.4 C) 97.9 F (36.6 C)  TempSrc:  Oral Oral Oral  SpO2: 94% 93% 95% 91%  Weight:      Height:        General: Pt is alert, awake, not in acute distress Cardiovascular: RRR, S1/S2 +, no rubs, no gallops Respiratory: CTA bilaterally, no wheezing, no rhonchi Abdominal: Soft, NT, ND, bowel sounds +, LLQ drain noted Extremities: no edema, no cyanosis    The results of significant diagnostics from this hospitalization (including imaging, microbiology, ancillary and laboratory) are listed below for reference.     Microbiology: No results found for this or any previous visit (from the past 240 hour(s)).   Labs: BNP (last 3 results) No results for input(s): "BNP" in the last 8760 hours. Basic Metabolic Panel: Recent Labs  Lab 12/31/21 0315 01/01/22 0517 01/02/22 0530 01/03/22 0301  NA 139 138 138 140  K 4.5 4.3 4.3 4.4  CL 100 99 100 96*  CO2 _0 GLUCOSE 211* 201* 177* 181*  BUN _1 CREATININE 0.65 0.60 0.60 0.72  CALCIUM 8.6* 8.5* 8.5* 9.4  MG 2.2 2.0  --  1.8  PHOS 3.5 4.1 4.2  --    Liver Function Tests: Recent Labs  Lab 01/01/22 0517  AST 29  ALT 22  ALKPHOS 98  BILITOT 0.2*  PROT 6.0*  ALBUMIN 1.5*   No results for input(s): "LIPASE", "AMYLASE" in the last 168 hours. No results for input(s): "AMMONIA" in the last 168 hours. CBC: Recent Labs  Lab 01/02/22 0530 01/03/22 0301  WBC 8.9 9.4  HGB 9.4* 9.5*  HCT 30.4* 29.9*  MCV 93.8 91.7  PLT 383 376   Cardiac Enzymes: No results for input(s): "CKTOTAL", "CKMB", "CKMBINDEX", "TROPONINI" in the last 168 hours. BNP: Invalid input(s): "POCBNP" CBG: Recent Labs  Lab 01/04/22 0827 01/04/22 1131 01/04/22 1636 01/05/22 0731 01/05/22 1114  GLUCAP 156* 136* 142* 166* 139*   D-Dimer No results for input(s): "DDIMER" in the last 72 hours. Hgb A1c No results for  input(s): "HGBA1C" in the last 72 hours. Lipid Profile No results for input(s): "CHOL", "HDL", "LDLCALC", "TRIG", "CHOLHDL", "LDLDIRECT" in the last 72 hours. Thyroid function studies No results for input(s): "TSH", "T4TOTAL", "T3FREE", "THYROIDAB" in the last 72 hours.  Invalid input(s): "FREET3" Anemia work up No results for input(s): "VITAMINB12", "FOLATE", "FERRITIN", "TIBC", "IRON", "RETICCTPCT" in the last 72 hours. Urinalysis    Component Value Date/Time   COLORURINE YELLOW 12/12/2021 1114   APPEARANCEUR CLOUDY (A) 12/12/2021 1114   LABSPEC 1.015 12/12/2021 1114   PHURINE 5.5 12/12/2021 1114   GLUCOSEU NEGATIVE 12/12/2021 1114   HGBUR NEGATIVE 12/12/2021 1114   BILIRUBINUR NEGATIVE 12/12/2021 1114   KETONESUR NEGATIVE 12/12/2021 1114   PROTEINUR 100 (A) 12/12/2021 1114   NITRITE NEGATIVE 12/12/2021 1114   LEUKOCYTESUR NEGATIVE 12/12/2021 1114   Sepsis Labs Recent Labs  Lab 01/02/22 0530 01/03/22 0301  WBC 8.9 9.4   Microbiology No results found for this or any previous visit (from the past 240 hour(s)).   Time coordinating discharge: Over 30 minutes  SIGNED:   Shawna Clamp, MD  [facility] 01/05/2022, 3:48 PM Pager   If 7PM-7AM, please contact night-coverage

## 2022-01-05 NOTE — Progress Notes (Signed)
25 Days Post-Op  Subjective: Woke up from sleep and very difficult to arouse, but did finally awaken.  No new complaints.  Eating ok.   Objective: Vital signs in last 24 hours: Temp:  [97.5 F (36.4 C)-99.1 F (37.3 C)] 97.9 F (36.6 C) (12/08 0850) Pulse Rate:  [92-107] 92 (12/08 0850) Resp:  [16-20] 20 (12/08 0541) BP: (103-137)/(67-79) 123/79 (12/08 0850) SpO2:  [91 %-95 %] 91 % (12/08 0850) FiO2 (%):  [28 %] 28 % (12/07 2021) Last BM Date : 01/04/22  Intake/Output from previous day: 12/07 0701 - 12/08 0700 In: 1160 [P.O.:1160] Out: 60 [Drains:60] Intake/Output this shift: No intake/output data recorded.  PE: Gen:  somnolent, but did awaken and arouse Abd: soft, not really tender, final surgical JP drain removed with no issues.  Last IR drain in LLQ remains with feculent output.  Lab Results:  Recent Labs    01/03/22 0301  WBC 9.4  HGB 9.5*  HCT 29.9*  PLT 376   BMET Recent Labs    01/03/22 0301  NA 140  K 4.4  CL 96*  CO2 30  GLUCOSE 181*  BUN 10  CREATININE 0.72  CALCIUM 9.4   PT/INR No results for input(s): "LABPROT", "INR" in the last 72 hours. CMP     Component Value Date/Time   NA 140 01/03/2022 0301   K 4.4 01/03/2022 0301   CL 96 (L) 01/03/2022 0301   CO2 30 01/03/2022 0301   GLUCOSE 181 (H) 01/03/2022 0301   BUN 10 01/03/2022 0301   CREATININE 0.72 01/03/2022 0301   CALCIUM 9.4 01/03/2022 0301   PROT 6.0 (L) 01/01/2022 0517   ALBUMIN 1.5 (L) 01/01/2022 0517   AST 29 01/01/2022 0517   ALT 22 01/01/2022 0517   ALKPHOS 98 01/01/2022 0517   BILITOT 0.2 (L) 01/01/2022 0517   GFRNONAA >60 01/03/2022 0301   GFRAA >60 05/11/2019 1411   Lipase     Component Value Date/Time   LIPASE 21 12/11/2021 1625    Studies/Results: No results found.  Anti-infectives: Anti-infectives (From admission, onward)    Start     Dose/Rate Route Frequency Ordered Stop   12/28/21 0600  amoxicillin-clavulanate (AUGMENTIN) 875-125 MG per tablet 1  tablet        1 tablet Oral  Once 12/28/21 0329 12/28/21 0637   12/25/21 0900  fluconazole (DIFLUCAN) IVPB 400 mg  Status:  Discontinued       See Hyperspace for full Linked Orders Report.   400 mg 100 mL/hr over 120 Minutes Intravenous Every 24 hours 12/23/21 1120 01/02/22 1223   12/24/21 0900  fluconazole (DIFLUCAN) IVPB 800 mg       See Hyperspace for full Linked Orders Report.   800 mg 200 mL/hr over 120 Minutes Intravenous  Once 12/23/21 1120 12/24/21 1910   12/20/21 1000  micafungin (MYCAMINE) 150 mg in sodium chloride 0.9 % 100 mL IVPB  Status:  Discontinued        150 mg 107.5 mL/hr over 1 Hours Intravenous Every 24 hours 12/20/21 0904 12/23/21 1120   12/12/21 0600  ceFEPIme (MAXIPIME) 2 g in sodium chloride 0.9 % 100 mL IVPB  Status:  Discontinued        2 g 200 mL/hr over 30 Minutes Intravenous Every 12 hours 12/11/21 2129 12/11/21 2323   12/12/21 0600  piperacillin-tazobactam (ZOSYN) IVPB 3.375 g  Status:  Discontinued        3.375 g 12.5 mL/hr over 240 Minutes Intravenous Every 8  hours 12/11/21 2326 01/02/22 1223   12/11/21 2330  piperacillin-tazobactam (ZOSYN) IVPB 3.375 g        3.375 g 100 mL/hr over 30 Minutes Intravenous To Post Anesthesia Care Unit 12/11/21 2326 12/12/21 0041   12/11/21 1630  metroNIDAZOLE (FLAGYL) IVPB 500 mg        500 mg 100 mL/hr over 60 Minutes Intravenous  Once 12/11/21 1619 12/11/21 2049   12/11/21 1630  ceFEPIme (MAXIPIME) 2 g in sodium chloride 0.9 % 100 mL IVPB        2 g 200 mL/hr over 30 Minutes Intravenous  Once 12/11/21 1623 12/11/21 1817        Assessment/Plan POD#25 - s/p diagnostic lap washout, evacuation of abscess, and JP drain placement x3 - Dr. Thermon Leyland 11/13 - CT 11/20 showed multiple loculated air and fluid collections throughout the abdomen and pelvis. S/p IR drain x 4 by 11/21 (7 drains total, 3 surgical, 4 IR) - This was likely perforated diverticultitis from beginning. Completed 22 days of Zosyn. Now off abx.  Clinically doing well, abdominal exam benign, afebrile and WBC has normalized. - Started staged removal of surgical drains 11/30 with LUQ drain which has been serous. Removal of L abdominal surgical drain 12/1 which had been serous. Last surgical JP removed 12/8  - CT 12/4 with overall stability vs improvement. 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 - surgically stable for DC home.  Saw in conjunction with primary service and discussed at bedside. -follow up with Dr. Thermon Leyland in the office   FEN - regular diet VTE - SCDs, sq heparin ID - Completed zosyn for 22 days.     Bacteroides Thetaiotaomicron bacteremia  HTN Morbid obesity OSA CHF Bipolar disorder HLD Seizures     LOS: 25 days    Henreitta Cea , [facility] 01/05/2022, 10:08 AM Please see Amion for pager number during day hours 7:00am-4:30pm

## 2022-01-05 NOTE — Progress Notes (Signed)
Referring Physician(s): Johnathan Hausen, MD  Supervising Physician: Michaelle Birks  Patient Status:  [facility] - In-pt  Chief Complaint: Follow up LLQ drain   Subjective:  Patient seen at bedside, she is preparing to go home. Family member at bedside, reviewed drain care with both today.   Allergies: Savella [milnacipran hcl]  Medications: Prior to Admission medications   Medication Sig Start Date End Date Taking? Authorizing Provider  acetaminophen (TYLENOL) 500 MG tablet Take 1,000 mg by mouth every 6 (six) hours as needed for moderate pain.    Yes [provider]  albuterol (VENTOLIN HFA) 108 (90 Base) MCG/ACT inhaler Inhale 2 puffs into the lungs every 6 (six) hours as needed for wheezing or shortness of breath. 10/06/20  Yes Rigoberto Noel, MD  ALPRAZolam Duanne Moron) 1 MG tablet Take 1 mg by mouth 3 (three) times daily.   Yes [provider]  amphetamine-dextroamphetamine (ADDERALL) 20 MG tablet Take 20 mg by mouth in the morning, at noon, in the evening, and at bedtime.    Yes [provider]  Ascorbic Acid (VITAMIN C) 500 MG tablet Take 1,000 mg by mouth daily.   Yes [provider]  aspirin 81 MG tablet Take 1 tablet (81 mg total) by mouth daily. 05/21/19  Yes Costella, Vista Mink, PA-C  atorvastatin (LIPITOR) 20 MG tablet TAKE 1 TABLET BY MOUTH EVERY DAY IN THE EVENING Patient taking differently: Take 20 mg by mouth daily. 03/15/21  Yes Minus Breeding, MD  b complex vitamins tablet Take 1 tablet by mouth daily.   Yes [provider]  Calcium Citrate (CITRACAL PO) Take 2 tablets by mouth daily.   Yes [provider]  Cholecalciferol (VITAMIN D3) 5000 UNITS TABS Take 10,000 Units by mouth in the morning and at bedtime.   Yes [provider]  Chromium Picolinate 500 MCG TABS Take 500 mcg by mouth daily.    Yes [provider]  diclofenac Sodium (VOLTAREN) 1 % GEL Apply 2 g topically 3 (three) times daily as needed (knee  pain).   Yes [provider]  furosemide (LASIX) 40 MG tablet Take 2 tablets (80 mg total) by mouth 2 (two) times daily. Patient taking differently: Take 40-80 mg by mouth See admin instructions. Take 80 mg in the morning and 40 mg in the evening 12/05/21  Yes [name], Jeneen Rinks, MD  [name] 1.5/30 1.5-30 MG-MCG tablet Take 1 tablet by mouth at bedtime. 08/03/19  Yes [provider]  levalbuterol (XOPENEX HFA) 45 MCG/ACT inhaler Inhale 2 puffs into the lungs every 6 (six) hours as needed for wheezing. 10/14/20  Yes Rigoberto Noel, MD  lidocaine (LIDODERM) 5 % PLACE 1 PATCH ONTO THE SKIN DAILY AS NEEDED FOR KNEE PAIN. REMOVE AND DISCARD PATCH WITHIN 12 HOURS OR AS DIRECTED BY DOCTOR. Patient taking differently: Place 1 patch onto the skin as needed (knee pain). 05/22/21  Yes Martinique, Betty G, MD  losartan (COZAAR) 25 MG tablet Take 1 tablet (25 mg total) by mouth 2 (two) times daily. 12/05/21  Yes [name], Jeneen Rinks, MD  LYRICA 300 MG capsule Take 300 mg by mouth 2 (two) times daily.  03/26/11  Yes [provider]  MAGNESIUM PO Take 500 mg by mouth daily.    Yes [provider]  Multiple Vitamin (MULTIVITAMIN) tablet Take 1 tablet by mouth daily.   Yes [provider]  NUEDEXTA 20-10 MG CAPS Take 1 capsule by mouth every 12 (twelve) hours.  09/27/16  Yes [provider]  Omega-3 Fatty Acids (FISH OIL) 1200 MG CAPS Take 2,400 mg by mouth daily.    Yes [provider]  potassium chloride SA (KLOR-CON M20) 20 MEQ tablet Take 1 tablet (20 mEq total) by mouth daily. 04/14/21  Yes [name], Jeneen Rinks, MD  VRAYLAR 6 MG CAPS Take 6 mg by mouth every evening.  09/03/16  Yes [provider]  Zinc 50 MG CAPS Take 50 mg by mouth in the morning and at bedtime.   Yes [provider]  Blood Glucose Monitoring Suppl (ACCU-CHEK AVIVA PLUS) w/Device KIT As directed. 11/14/21   Martinique, Betty G, MD  methocarbamol (ROBAXIN-750) 750 MG tablet Take 1 tablet (750 mg  total) by mouth 3 (three) times daily as needed for muscle spasms. Patient not taking: Reported on 12/11/2021 05/17/19   Traci Sermon, PA-C  oxyCODONE (OXY IR/ROXICODONE) 5 MG immediate release tablet Take 1 tablet (5 mg total) by mouth every 4 (four) hours as needed for moderate pain. 01/05/22   Saverio Danker, PA-C     Vital Signs: BP 123/79 (BP Location: Right Arm)   Pulse 92   Temp 97.9 F (36.6 C) (Oral)   Resp 20   Ht _0  (1.651 m)   Wt (!) 309 lb 14.4 oz (140.6 kg)   SpO2 91%   BMI 51.57 kg/m   Physical Exam Vitals and nursing note reviewed.  Constitutional:      General: She is not in acute distress. HENT:     Head: Normocephalic.  Cardiovascular:     Rate and Rhythm: Normal rate.  Pulmonary:     Effort: Pulmonary effort is normal.  Abdominal:     Palpations: Abdomen is soft.     Comments: (+) LLQ drain to gravity with ~20 cc feculent + gaseous output. Drain flushed/aspirates easily. Insertion site clean, dry, dressed appropriately. Suture in tact, no stat lock present.  Skin:    General: Skin is warm and dry.  Neurological:     Mental Status: She is alert.     Imaging: IR Sinus/Fist Tube Chk-Non GI  Result Date: 01/03/2022 INDICATION: Postop abscess drains, decreased output EXAM: FLUOROSCOPIC INJECTION OF THE 4 ABDOMINAL ABSCESS DRAINS MEDICATIONS: The patient is currently admitted to the hospital and receiving intravenous antibiotics. The antibiotics were administered within an appropriate time frame prior to the initiation of the procedure. ANESTHESIA/SEDATION: None. COMPLICATIONS: None immediate. PROCEDURE: Informed written consent was obtained from the patient after a thorough discussion of the procedural risks, benefits and alternatives. All questions were addressed. Maximal Sterile Barrier Technique was utilized including caps, mask, sterile gowns, sterile gloves, sterile drape, hand hygiene and skin antiseptic. A timeout was performed prior to the  initiation of the procedure. Patient has a total of 4 percutaneous drains, 2 on each side. Injection of the 2 right-sided drains initially performed. Collapsed abscess cavities. Negative for fistula. No significant residual fluid by CT at these 2 drain catheter sites. Both drains were removed. Injection of the 2 left-sided drains. Left superior drain has a small collapsed abscess. Negative for fistula. No significant residual fluid at this catheter site by CT. Drain removed. 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 This correlates with the fecal contaminated output. Residual abscess cavity remains. This drain will remain to gravity drainage. IMPRESSION: The 2 right-sided drains and left superior drain are negative for fistula with no significant residual abscess by CT. These 3 drains were removed. 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 Drain will remain to gravity drainage. Electronically Signed   By: Jerilynn Mages.  Shick M.D.   On: 01/03/2022 11:30   IR Sinus/Fist Tube Chk-Non GI  Result Date: 01/03/2022 INDICATION: Postop abscess drains, decreased output EXAM: FLUOROSCOPIC INJECTION OF THE 4 ABDOMINAL ABSCESS DRAINS MEDICATIONS: The patient is currently admitted to the hospital and receiving intravenous antibiotics. The antibiotics were administered within an appropriate time frame prior to the initiation of the procedure. ANESTHESIA/SEDATION: None. COMPLICATIONS: None immediate. PROCEDURE: Informed written consent was obtained from the patient after a thorough discussion of the procedural risks, benefits and alternatives. All questions were addressed. Maximal Sterile Barrier Technique was utilized including caps, mask, sterile gowns, sterile gloves, sterile drape, hand hygiene and skin antiseptic. A timeout was performed prior to  the initiation of the procedure. Patient has a total of 4 percutaneous drains, 2 on each side. Injection of the 2 right-sided drains initially performed. Collapsed abscess cavities. Negative for fistula. No significant residual fluid by CT at these 2 drain catheter sites. Both drains were removed. Injection of the 2 left-sided drains. Left superior drain has a small collapsed abscess. Negative for fistula. No significant residual fluid at this catheter site by CT. Drain removed. 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 This correlates with the fecal contaminated output. Residual abscess cavity remains. This drain will remain to gravity drainage. IMPRESSION: The 2 right-sided drains and left superior drain are negative for fistula with no significant residual abscess by CT. These 3 drains were removed. 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 Drain will remain to gravity drainage. Electronically Signed   By: Jerilynn Mages.  Shick M.D.   On: 01/03/2022 11:30   IR Sinus/Fist Tube Chk-Non GI  Result Date: 01/03/2022 INDICATION: Postop abscess drains, decreased output EXAM: FLUOROSCOPIC INJECTION OF THE 4 ABDOMINAL ABSCESS DRAINS MEDICATIONS: The patient is currently admitted to the hospital and receiving intravenous antibiotics. The antibiotics were administered within an appropriate time frame prior to the initiation of the procedure. ANESTHESIA/SEDATION: None. COMPLICATIONS: None immediate. PROCEDURE: Informed written consent was obtained from the patient after a thorough discussion of the procedural risks, benefits and alternatives. All questions were addressed. Maximal Sterile Barrier Technique was utilized including caps, mask, sterile gowns, sterile gloves, sterile drape, hand hygiene and skin antiseptic. A timeout was performed prior  to the initiation of the procedure. Patient has a total of 4 percutaneous drains, 2 on each side. Injection of the 2 right-sided drains initially performed. Collapsed abscess cavities. Negative for fistula. No significant residual fluid by CT at these 2 drain catheter sites. Both drains were removed. Injection of the 2 left-sided drains. Left superior drain has a small collapsed abscess. Negative for fistula. No significant residual fluid at this catheter site by CT. Drain removed. 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 This correlates with the fecal contaminated output. Residual abscess cavity remains. This drain will remain to gravity drainage. IMPRESSION: The 2 right-sided drains and left superior drain are negative for fistula with no significant residual abscess by CT. These 3 drains were removed. 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 Drain will remain to gravity drainage. Electronically Signed   By: Jerilynn Mages.  Shick M.D.   On: 01/03/2022 11:30   IR Sinus/Fist Tube Chk-Non GI  Result Date: 01/03/2022 INDICATION: Postop abscess drains, decreased output EXAM: FLUOROSCOPIC INJECTION OF THE 4 ABDOMINAL ABSCESS DRAINS MEDICATIONS: The patient is currently admitted to the hospital and receiving intravenous antibiotics. The antibiotics were administered within an appropriate time frame prior to the initiation of the procedure. ANESTHESIA/SEDATION: None. COMPLICATIONS: None immediate. PROCEDURE: Informed written consent was obtained from the patient after a thorough discussion of the procedural risks, benefits and alternatives. All questions were addressed. Maximal Sterile Barrier Technique was utilized including caps, mask, sterile gowns, sterile gloves, sterile drape, hand hygiene and skin antiseptic. A timeout was performed  prior to the initiation of the procedure. Patient has a total of 4 percutaneous drains, 2 on each side. Injection of the 2 right-sided drains initially performed. Collapsed abscess cavities. Negative for fistula. No significant residual fluid by CT at these 2 drain catheter sites. Both drains were removed. Injection of the 2 left-sided drains. Left superior drain has a small collapsed abscess. Negative for fistula. No significant residual fluid at this catheter site by CT. Drain removed. 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 This correlates with the fecal contaminated output. Residual abscess cavity remains. This drain will remain to gravity drainage. IMPRESSION: The 2 right-sided drains and left superior drain are negative for fistula with no significant residual abscess by CT. These 3 drains were removed. 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 Drain will remain to gravity drainage. Electronically Signed   By: Jerilynn Mages.  Shick M.D.   On: 01/03/2022 11:30    Labs:  CBC: Recent Labs    12/28/21 0948 12/29/21 0824 01/02/22 0530 01/03/22 0301  WBC 13.4* 8.7 8.9 9.4  HGB 10.8* 8.9* 9.4* 9.5*  HCT 33.5* 31.3* 30.4* 29.9*  PLT 379 331 383 376    COAGS: No results for input(s): "INR", "APTT" in the last 8760 hours.  BMP: Recent Labs    12/31/21 0315 01/01/22 0517 01/02/22 0530 01/03/22 0301  NA 139 138 138 140  K 4.5 4.3 4.3 4.4  CL 100 99 100 96*  CO2 _0 GLUCOSE 211* 201* 177* 181*  BUN _1 CALCIUM 8.6* 8.5* 8.5* 9.4  CREATININE 0.65 0.60 0.60 0.72  GFRNONAA >60 >60 >60 >60    LIVER FUNCTION TESTS: Recent Labs    12/24/21 0412 12/25/21 0411 12/28/21 0948 01/01/22 0517  BILITOT <0.1* 0.4 <0.1* 0.2*  AST 20 19 34 29  ALT _2 ALKPHOS 87 74 92 98  PROT 5.4* 5.3*  6.1* 6.0*  ALBUMIN 1.5* <1.5* 1.7* 1.5*    Assessment and Plan:  51 y/o F with history of perforated diverticulitis s/p ex lap and peritoneal lavage with drain placement in IR 11/13 and 11/22 seen today for follow up remaining drain.   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 Drain assessed today - flushed/aspirates easily, feculent + gaseous output present in gravity bag. Reviewed drain care with patient and her family member who will be caring for the drain at home, all questions answered and they state understanding  regarding drain care. All previous drain insertion site dressings were changed during my exam per patient request.  Upon return home flush drain QD with 5 cc NS, record output QD, dressing changes PRN when soiled/wet/coming off skin. Patient will follow up in IR clinic in about 2 weeks, IR scheduler will call to schedule appt date/time. Patient and family member were encouraged to call with any questions or concerns before their appointment (contact info in AVS).  Please call with questions or concerns.  Electronically Signed: Joaquim Nam, PA-C 01/05/2022, 3:59 PM   I spent a total of 25 Minutes at the the patient's bedside AND on the patient's hospital floor or unit, greater than 50% of which was counseling/coordinating care for drain follow up.

## 2022-01-06 ENCOUNTER — Other Ambulatory Visit (HOSPITAL_COMMUNITY): Payer: Self-pay

## 2022-01-08 ENCOUNTER — Other Ambulatory Visit (HOSPITAL_COMMUNITY): Payer: Self-pay

## 2022-01-08 ENCOUNTER — Telehealth: Payer: Self-pay

## 2022-01-08 ENCOUNTER — Other Ambulatory Visit (HOSPITAL_COMMUNITY): Payer: Self-pay | Admitting: Surgery

## 2022-01-08 DIAGNOSIS — K5720 Diverticulitis of large intestine with perforation and abscess without bleeding: Secondary | ICD-10-CM

## 2022-01-08 NOTE — Telephone Encounter (Signed)
Transition Care Management Unsuccessful Follow-up Telephone Call [place] 01-05-22 Dx: pneumoperitoneum Date of discharge and from where:    Attempts:  1st Attempt  Reason for unsuccessful TCM follow-up call:  Unable to leave message   Juanda Crumble LPN [facility] Direct Dial [phone]

## 2022-01-09 ENCOUNTER — Ambulatory Visit: Payer: Medicare Other | Admitting: Psychology

## 2022-01-09 LAB — FUNGUS CULTURE RESULT

## 2022-01-09 LAB — FUNGUS CULTURE WITH STAIN

## 2022-01-09 LAB — FUNGAL ORGANISM REFLEX

## 2022-01-12 NOTE — Progress Notes (Signed)
HPI: [name] is a 51 y.o. femaleDM 2, pulmonary hypertension, polycythemia vera, OSA, hyperlipidemia, hypertension, bipolar disorder, and asthma here today with ehr sister-in-law to follow on recent hospitalization. She was admitted on 12/11/2021 and discharged on 01/05/2022. TCM 01/08/22.  She presented to the ED complaining of abdominal pain, abdominal large pelvic CT showed free gas and fluid. Acute diverticulitis with perforation. She was evaluated by general surgery and was taken emergently to the OR.  There was finding of well-formed abscess cavity in the lower mid abdomen near the sigmoid colon with purulent peritonitis and fibrinous exudate throughout the abdomen. S/P exploratory laparoscopy, peritoneal lavage with drain placement x 7 on 12/11/21. She has a drain left.  She was on Zosyn and fluconazole during hospitalization. She received Zosyn for 22 days prior to discharge.  Lab Results  Component Value Date   WBC 9.4 01/03/2022   HGB 9.5 (L) 01/03/2022   HCT 29.9 (L) 01/03/2022   MCV 91.7 01/03/2022   PLT 376 01/03/2022   Lab Results  Component Value Date   CREATININE 0.72 01/03/2022   BUN 10 01/03/2022   NA 140 01/03/2022   K 4.4 01/03/2022   CL 96 (L) 01/03/2022   CO2 30 01/03/2022   She reports that her appetite and mobility are slowly improving and denies any fever, chills,or worsening pain around drain. She has some concerns about healing wounds after drains were removed. Prior to hospital discharge, she was recommended flushing the drain daily, her sister in law has been helping her with this. Negative for abdominal pain,nausea,or vomiting.  She is using a walker at home and receiving physical therapy once per week.  Since leaving the hospital, she has had three bowel movements and she is passing gas. Her urine output is normal. The home health care nurse has visited once . She has follow up appt with surgeon on 01/31/22. She has an  abdominal/pelvic CT scheduled for tomorrow.  Medications were not changed. HTN on Losartan 25 mg 2 tabs daily. Hyperkalemia noted during initial ED evaluation, K+ 4.4 upon discharge. Not checking BP.  DM II: During hospitalization problem was manage with insulin. She is not checking BS's. She is on non pharmacologic treatment.  Lab Results  Component Value Date   HGBA1C 5.9 (A) 11/14/2021   Review of Systems  Constitutional:  Positive for activity change and fatigue. Negative for appetite change, fever and unexpected weight change.  HENT:  Negative for mouth sores, nosebleeds and trouble swallowing.   Eyes:  Negative for redness and visual disturbance.  Respiratory:  Negative for cough, shortness of breath and wheezing.   Cardiovascular:  Negative for chest pain, palpitations and leg swelling.  Genitourinary:  Negative for decreased urine volume, difficulty urinating, dysuria and hematuria.  Neurological:  Negative for seizures, syncope, weakness, numbness and headaches.  See other pertinent positives and negatives in HPI.  Current Outpatient Medications on File Prior to Visit  Medication Sig Dispense Refill   acetaminophen (TYLENOL) 500 MG tablet Take 1,000 mg by mouth every 6 (six) hours as needed for moderate pain.      albuterol (VENTOLIN HFA) 108 (90 Base) MCG/ACT inhaler Inhale 2 puffs into the lungs every 6 (six) hours as needed for wheezing or shortness of breath. 32 g 0   ALPRAZolam (XANAX) 1 MG tablet Take 1 mg by mouth 3 (three) times daily.     amphetamine-dextroamphetamine (ADDERALL) 20 MG tablet Take 20 mg by mouth in the morning, at noon, in the  evening, and at bedtime.      Ascorbic Acid (VITAMIN C) 500 MG tablet Take 1,000 mg by mouth daily.     aspirin 81 MG tablet Take 1 tablet (81 mg total) by mouth daily. 30 tablet    atorvastatin (LIPITOR) 20 MG tablet TAKE 1 TABLET BY MOUTH EVERY DAY IN THE EVENING (Patient taking differently: Take 20 mg by mouth daily.) 90  tablet 4   b complex vitamins tablet Take 1 tablet by mouth daily.     Blood Glucose Monitoring Suppl (ACCU-CHEK AVIVA PLUS) w/Device KIT As directed. 1 kit 0   Calcium Citrate (CITRACAL PO) Take 2 tablets by mouth daily.     Cholecalciferol (VITAMIN D3) 5000 UNITS TABS Take 10,000 Units by mouth in the morning and at bedtime.     Chromium Picolinate 500 MCG TABS Take 500 mcg by mouth daily.      diclofenac Sodium (VOLTAREN) 1 % GEL Apply 2 g topically 3 (three) times daily as needed (knee pain).     furosemide (LASIX) 40 MG tablet Take 2 tablets (80 mg total) by mouth 2 (two) times daily. (Patient taking differently: Take 40-80 mg by mouth See admin instructions. Take 80 mg in the morning and 40 mg in the evening) 360 tablet 3   [name] 1.5/30 1.5-30 MG-MCG tablet Take 1 tablet by mouth at bedtime.     levalbuterol (XOPENEX HFA) 45 MCG/ACT inhaler Inhale 2 puffs into the lungs every 6 (six) hours as needed for wheezing. 45 g 3   lidocaine (LIDODERM) 5 % PLACE 1 PATCH ONTO THE SKIN DAILY AS NEEDED FOR KNEE PAIN. REMOVE AND DISCARD PATCH WITHIN 12 HOURS OR AS DIRECTED BY DOCTOR. (Patient taking differently: Place 1 patch onto the skin as needed (knee pain).) 30 patch 0   losartan (COZAAR) 25 MG tablet Take 1 tablet (25 mg total) by mouth 2 (two) times daily. 180 tablet 2   LYRICA 300 MG capsule Take 300 mg by mouth 2 (two) times daily.      MAGNESIUM PO Take 500 mg by mouth daily.      Multiple Vitamin (MULTIVITAMIN) tablet Take 1 tablet by mouth daily.     NUEDEXTA 20-10 MG CAPS Take 1 capsule by mouth every 12 (twelve) hours.   12   Omega-3 Fatty Acids (FISH OIL) 1200 MG CAPS Take 2,400 mg by mouth daily.      oxyCODONE (OXY IR/ROXICODONE) 5 MG immediate release tablet Take 1 tablet (5 mg total) by mouth every 4 (four) hours as needed for moderate pain. 10 tablet 0   potassium chloride SA (KLOR-CON M20) 20 MEQ tablet Take 1 tablet (20 mEq total) by mouth daily. 90 tablet 4   VRAYLAR 6 MG CAPS Take  6 mg by mouth every evening.      Zinc 50 MG CAPS Take 50 mg by mouth in the morning and at bedtime.     No current facility-administered medications on file prior to visit.   Past Medical History:  Diagnosis Date   Acute systolic congestive heart failure (HCC)    Agoraphobia    Anxiety    panic attacks   Arthritis    osteoarthritis bilal. knees   Asthma    no per PFT 7/13; reports does not have asthma   Bipolar disorder (HCC)    Carpal tunnel syndrome of left wrist 05/2011   Being evaluated for MS   Cor pulmonale (HCC)    Depression    History of pleural effusion  Hyperlipidemia    Hypertension    Hypoxemia    history of - no home O2   IBS (irritable bowel syndrome)    Morbid obesity (HCC)    Obesity hypoventilation syndrome (HCC)    Pneumonia    Pre-diabetes    Prediabetes    Seasonal allergies    current runny nose   Seizures (HCC)    febrile seizure x 1 as a child. Several times   Sleep apnea sleep study 07/02/2010   uses CPAP nightly   Spinal stenosis in cervical region    Urinary incontinence    Allergies  Allergen Reactions   Savella [Milnacipran Hcl] Other (See Comments)    mania    Social History   Socioeconomic History   Marital status: Married    Spouse name: Not on file   Number of children: Not on file   Years of education: Not on file   Highest education level: Bachelor's degree (e.g., BA, AB, BS)  Occupational History   Occupation: currently unemployed  Tobacco Use   Smoking status: Never   Smokeless tobacco: Never  Substance and Sexual Activity   Alcohol use: Yes    Comment: rare   Drug use: No   Sexual activity: Not on file  Other Topics Concern   Not on file  Social History Narrative   Lives in [place]         Social Determinants of Health   Financial Resource Strain: Medium Risk (11/13/2021)   Overall Financial Resource Strain (CARDIA)    Difficulty of Paying Living Expenses: Somewhat hard  Food Insecurity: No Food  Insecurity (12/13/2021)   Hunger Vital Sign    Worried About Running Out of Food in the Last Year: Never true    Ran Out of Food in the Last Year: Never true  Transportation Needs: No Transportation Needs (12/13/2021)   PRAPARE - Administrator, Civil Service (Medical): No    Lack of Transportation (Non-Medical): No  Physical Activity: Insufficiently Active (11/13/2021)   Exercise Vital Sign    Days of Exercise per Week: 1 day    Minutes of Exercise per Session: 10 min  Stress: Stress Concern Present (11/13/2021)   [employer] of Occupational Health - Occupational Stress Questionnaire    Feeling of Stress : Very much  Social Connections: Moderately Isolated (11/13/2021)   Social Connection and Isolation Panel [NHANES]    Frequency of Communication with Friends and Family: More than three times a week    Frequency of Social Gatherings with Friends and Family: More than three times a week    Attends Religious Services: 1 to 4 times per year    Active Member of [employer] or Organizations: No    Attends Banker Meetings: Never    Marital Status: Widowed   Today's Vitals   01/15/22 1342  BP: 118/70  Pulse: 96  Resp: 16  SpO2: 97%  Height: 5\' 5"  (1.651 m)   Body mass index is 51.57 kg/m.  Physical Exam Vitals and nursing note reviewed.  Constitutional:      General: She is not in acute distress.    Appearance: She is well-developed.  HENT:     Head: Normocephalic and atraumatic.     Mouth/Throat:     Mouth: Mucous membranes are moist.     Pharynx: Oropharynx is clear.  Eyes:     Conjunctiva/sclera: Conjunctivae normal.  Cardiovascular:     Rate and Rhythm: Normal rate and regular rhythm.  Heart sounds: No murmur heard. Pulmonary:     Effort: Pulmonary effort is normal. No respiratory distress.     Breath sounds: Normal breath sounds.  Abdominal:     Palpations: Abdomen is soft.     Tenderness: There is no abdominal tenderness.     Musculoskeletal:     Right lower leg: No edema.     Left lower leg: No edema.  Lymphadenopathy:     Cervical: No cervical adenopathy.  Skin:    General: Skin is warm.     Findings: No erythema or rash.  Neurological:     General: No focal deficit present.     Mental Status: She is alert and oriented to person, place, and time.     Cranial Nerves: No cranial nerve deficit.     Gait: Gait normal.     Comments: Unstable gait, needs to be assisted when walking to the wheel chair.  Psychiatric:        Mood and Affect: Mood and affect normal.   ASSESSMENT AND PLAN:  [name] was seen today for hospitalization follow-up.  Diagnoses and all orders for this visit: Orders Placed This Encounter  Procedures   CBC with Differential/Platelet   Basic metabolic panel   Lab Results  Component Value Date   WBC 7.9 01/15/2022   HGB 12.1 01/15/2022   HCT 37.7 01/15/2022   MCV 86.5 01/15/2022   PLT 541.0 (H) 01/15/2022   Lab Results  Component Value Date   CREATININE 0.71 01/15/2022   BUN 10 01/15/2022   NA 142 01/15/2022   K 3.8 01/15/2022   CL 101 01/15/2022   CO2 26 01/15/2022   Essential hypertension, benign BP adequately controlled. Continue losartan 50 mg daily. Continue monitoring BP regularly and low-salt diet.  Type 2 diabetes mellitus with other specified complication (HCC) HgA1C 5.9 in 10/2021. Continue non pharmacologic treatment. Recommend monitoring BS's.  Perforation of sigmoid colon due to diverticulitis S/P exploratory laparoscopy, peritoneal lavage with drain placement on 12/11/21, she has a drain left in [facility]Q.  Completed 22 days of Zosyn. Continue daily flushing as instructed during hospital discharge. Monitor for signs of infection around drain and fever. Some of laparoscopic wound have mild surrounded erythema, no local heat or edema. Recommend  topical mupirocin bid for 10 days. Nurse visiting once per week. Has appt with surgeon 01/31/22 and  abdomina/pelvic CT scheduled for tomorrow. Instructed about warning signs.  I spent a total of 44 minutes in both face to face and non face to face activities for this visit on the date of this encounter. During this time history was obtained and documented, examination was performed, prior labs/imaging reviewed, and assessment/plan discussed.  Return if symptoms worsen or fail to improve, for keep next appointment.  Neilson Oehlert G. [place], MD  [facility]. [place] office.

## 2022-01-15 ENCOUNTER — Encounter: Payer: Self-pay | Admitting: Family Medicine

## 2022-01-15 ENCOUNTER — Ambulatory Visit: Payer: BC Managed Care – PPO | Admitting: Adult Health

## 2022-01-15 ENCOUNTER — Ambulatory Visit (INDEPENDENT_AMBULATORY_CARE_PROVIDER_SITE_OTHER): Payer: Medicare PPO | Admitting: Family Medicine

## 2022-01-15 VITALS — BP 118/70 | HR 96 | Resp 16 | Ht 65.0 in

## 2022-01-15 DIAGNOSIS — I10 Essential (primary) hypertension: Secondary | ICD-10-CM | POA: Diagnosis not present

## 2022-01-15 DIAGNOSIS — E1169 Type 2 diabetes mellitus with other specified complication: Secondary | ICD-10-CM

## 2022-01-15 DIAGNOSIS — K5720 Diverticulitis of large intestine with perforation and abscess without bleeding: Secondary | ICD-10-CM | POA: Diagnosis not present

## 2022-01-15 LAB — CBC WITH DIFFERENTIAL/PLATELET
Basophils Absolute: 0.1 10*3/uL (ref 0.0–0.1)
Basophils Relative: 1.2 % (ref 0.0–3.0)
Eosinophils Absolute: 0.2 10*3/uL (ref 0.0–0.7)
Eosinophils Relative: 2.6 % (ref 0.0–5.0)
HCT: 37.7 % (ref 36.0–46.0)
Hemoglobin: 12.1 g/dL (ref 12.0–15.0)
Lymphocytes Relative: 24.4 % (ref 12.0–46.0)
Lymphs Abs: 1.9 10*3/uL (ref 0.7–4.0)
MCHC: 32 g/dL (ref 30.0–36.0)
MCV: 86.5 fl (ref 78.0–100.0)
Monocytes Absolute: 0.4 10*3/uL (ref 0.1–1.0)
Monocytes Relative: 5.2 % (ref 3.0–12.0)
Neutro Abs: 5.3 10*3/uL (ref 1.4–7.7)
Neutrophils Relative %: 66.6 % (ref 43.0–77.0)
Platelets: 541 10*3/uL — ABNORMAL HIGH (ref 150.0–400.0)
RBC: 4.36 Mil/uL (ref 3.87–5.11)
RDW: 17.2 % — ABNORMAL HIGH (ref 11.5–15.5)
WBC: 7.9 10*3/uL (ref 4.0–10.5)

## 2022-01-15 LAB — BASIC METABOLIC PANEL
BUN: 10 mg/dL (ref 6–23)
CO2: 26 mEq/L (ref 19–32)
Calcium: 9.9 mg/dL (ref 8.4–10.5)
Chloride: 101 mEq/L (ref 96–112)
Creatinine, Ser: 0.71 mg/dL (ref 0.40–1.20)
GFR: 97.94 mL/min (ref >60.00)
Glucose, Bld: 140 mg/dL — ABNORMAL HIGH (ref 70–99)
Potassium: 3.8 mEq/L (ref 3.5–5.1)
Sodium: 142 mEq/L (ref 135–145)

## 2022-01-15 MED ORDER — MUPIROCIN 2 % EX OINT: 1.0000 | TOPICAL_OINTMENT | Freq: Two times a day (BID) | CUTANEOUS | 0 refills | Status: AC

## 2022-01-15 NOTE — Patient Instructions (Addendum)
A few things to remember from today's visit:  Perforation of sigmoid colon due to diverticulitis - Plan: CBC with Differential/Platelet  AKI (acute kidney injury) ([place]) - Plan: Basic metabolic panel  Type 2 diabetes mellitus with other specified complication, without long-term current use of insulin ([place])  Tomorrow home health nurse needs to change dressing. Keep areas clean with soap a water, no on drain.  Apply mupirocin around wounds on left upper and right mid abdomen. Keep appt with surgeon, if any problem with drain, please call their office.  If you need refills for medications you take chronically, please call your pharmacy. Do not use My Chart to request refills or for acute issues that need immediate attention. If you send a my chart message, it may take a few days to be addressed, specially if I am not in the office.  Please be sure medication list is accurate. If a new problem present, please set up appointment sooner than planned today.

## 2022-01-15 NOTE — Assessment & Plan Note (Signed)
BP adequately controlled. Continue losartan 50 mg daily. Continue monitoring BP regularly and low-salt diet.

## 2022-01-15 NOTE — Assessment & Plan Note (Addendum)
S/P exploratory laparoscopy, peritoneal lavage with drain placement on 12/11/21, she has a drain left in LLQ.  Completed 22 days of Zosyn. Continue daily flushing as instructed during hospital discharge. Monitor for signs of infection around drain and fever. Some of laparoscopic wound have mild surrounded erythema, no local heat or edema. Recommend  topical mupirocin bid for 10 days. Nurse visiting once per week. Has appt with surgeon 01/31/22 and abdomina/pelvic CT scheduled for tomorrow. Instructed about warning signs.

## 2022-01-15 NOTE — Assessment & Plan Note (Signed)
HgA1C 5.9 in 10/2021. Continue non pharmacologic treatment. Recommend monitoring BS's.

## 2022-01-16 ENCOUNTER — Ambulatory Visit
Admission: RE | Admit: 2022-01-16 | Discharge: 2022-01-16 | Disposition: A | Discharge status: Home / Self Care | Payer: Medicare PPO | Source: Ambulatory Visit | Attending: Surgery | Admitting: Surgery

## 2022-01-16 ENCOUNTER — Other Ambulatory Visit (HOSPITAL_COMMUNITY): Payer: Self-pay | Admitting: Surgery

## 2022-01-16 ENCOUNTER — Ambulatory Visit
Admission: RE | Admit: 2022-01-16 | Discharge: 2022-01-16 | Disposition: A | Discharge status: Home / Self Care | Payer: Medicare PPO | Source: Ambulatory Visit | Attending: Student | Admitting: Student

## 2022-01-16 ENCOUNTER — Ambulatory Visit (INDEPENDENT_AMBULATORY_CARE_PROVIDER_SITE_OTHER): Payer: Medicare PPO | Admitting: Psychology

## 2022-01-16 DIAGNOSIS — K5720 Diverticulitis of large intestine with perforation and abscess without bleeding: Secondary | ICD-10-CM

## 2022-01-16 DIAGNOSIS — F4321 Adjustment disorder with depressed mood: Secondary | ICD-10-CM | POA: Diagnosis not present

## 2022-01-16 MED ORDER — IOPAMIDOL (ISOVUE-300) INJECTION 61%
10.0000 mL | Freq: Once | INTRAVENOUS | Status: AC | PRN
  Administered 2022-01-16: 10 mL

## 2022-01-16 NOTE — Progress Notes (Signed)
[name] is a 51 y.o. female patient   Treatment Plan: Date: 01/16/2022  Diagnosis 296.80 (Unspecified bipolar or related disorder) [n/a]  Symptoms Depressed or irritable mood. (Status: maintained) -- No Description Entered  Diminished interest in or enjoyment of activities. (Status: maintained) -- No Description Entered  Lack of energy. (Status: maintained) -- No Description Entered  Low self-esteem. (Status: maintained) -- No Description Entered  Medication Status compliance  Safety none  If Suicidal or Homicidal State Action Taken: unspecified  Current Risk: low Medications Adderall (Dosage: '20mg'$  4X/day)  Klonapin (Dosage: '1mg'$  3X/day)  Lamictal (Dosage: '200mg'$  2x/day)  Nuedexta (Dosage: '20mg'$ )  Vyralar (Dosage: '6mg'$ )  Xanax (Dosage: '1mg'$  3X/day)  Objectives Related Problem: Develop healthy cognitive patterns and beliefs about self and the world that lead to alleviation and help prevent the relapse of mood episodes. Description: State no longer having thoughts of self-harm. Target Date: 2022-01-07 Frequency: Daily Modality: individual Progress: 80%  Related Problem: Develop healthy cognitive patterns and beliefs about self and the world that lead to alleviation and help prevent the relapse of mood episodes. Description: Verbalize any history of past and present suicidal thoughts and actions. Target Date: 2023-01-08 Frequency: Daily Modality: individual Progress: 80%  Related Problem: Develop healthy cognitive patterns and beliefs about self and the world that lead to alleviation and help prevent the relapse of mood episodes. Description: Take prescribed medications as directed. Target Date: 2023-01-08 Frequency: Daily Modality: individual Progress: 90%  Related Problem: Develop healthy cognitive patterns and beliefs about self and the world that lead to alleviation and help prevent the relapse of mood episodes. Description:  Discuss and resolve troubling personal and interpersonal issues. Target Date: 2023-01-08 Frequency: Daily Modality: individual Progress: 37% Resolve complicated grief       Target Date: 12-24       Progress: 20%  Client Response full compliance  Service Location Location, [name] [place], [place] [address] -virtual Service Code cpt [phone]  Emotion regulation skills  Related past to present  Rationally challenge thoughts or beliefs/cognitive restructuring  Identify/label emotions  Validate/empathize  Self care activities  Lifestyle change (exercise, nutrition)  Self-monitoring  Facilitate problem solving  Session notes:  f31.9 Bipolar Unspecified.  Goals: Mood stabilization, improved marital relationship, improve self esteem and elimination of self harm thoughts/tendencies. Also, patient struggles with leaving home comfortably and seeks to improve this ability. Target date for these goals is January, 2023. In addition, she needs to improve self care behaviors due to her weight and various medical conditions. Target date for this goal is Feb., 2022. Date revised to December, 2023 to allow time for behavioral implementation. Reports periodic outbursts of rage/anger. Seeks to minimize. Target date is December, 2023. Patient has realized improvement in all areas. Requests additional treatment to facilitate maintenance of progress. Goal date 12-24. Patient's husband died unexpectedly and she is suffering complicated grief. She needs additional counseling to address associated issues and manage feelings of loss, despair and anxiety. Goal date 12-24.  Meds: Vyralar ('6mg'$ ),Nuedexta (40+'20mg'$ /day), Adderall ('20mg'$  4X/day), Xanax ('1mg'$ , 3X/day), and Klonopin ('1mg'$  3X/day).  Patient has requested a video session. She is at home and I am in my home office.  [name] was recently released from hospital. Healing is slow and she still has a drain. A colostomy is still an option if needed. She is on  a low sodium heart healthy diet. She has had good support  from family and friends. Claims she was medicated and does not remember much of the 4 weeks in the hospital. She had a scare last night and thought someone was trying to break into her house. She called police and they came out. They suggested it may have been the wind as there was no evidence of tampering on the door.Feeling more stable than when in hospital and anger has subsided.                                                [name]                                                                                                                                                                                        Jessica Morel, PhD  Time:2:10p-3:00p 50 minutes.                           n

## 2022-01-16 NOTE — Progress Notes (Signed)
Chief Complaint: Patient was seen in consultation today for No chief complaint on file.  at the request of Segal,Matthew L  Referring Physician(s): Segal,Matthew L  History of Present Illness: [name] is a 51 y.o. female With a history of perforated diverticulitis status post exploratory laparotomy and peritoneal lavage which was complicated by formation of multiple intra-abdominal abscesses.  A total of 4 drainage catheters were placed between 11/13 and 11/22.  3 of the drainage catheters were successfully removed.  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  Patient presents today for repeat drain injection.  Drain output remains feculent.  Output is relatively small at 10-15 mL daily but remains brown and foul-smelling.  Patient continues to flush drainage catheter.  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  No evidence of fistula to small bowel on today's exam.  There has been no significant improvement in the fistulous communication between the drainage catheter in the sigmoid colon.  Past Medical History:  Diagnosis Date   Acute systolic congestive heart failure (HCC)    Agoraphobia    Anxiety    panic attacks   Arthritis    osteoarthritis bilal. knees   Asthma    no per PFT 7/13; reports does not have asthma   Bipolar disorder ([place])    Carpal tunnel syndrome of left wrist 05/2011   Being evaluated for MS   Cor pulmonale ([place])    Depression    History of pleural effusion    Hyperlipidemia    Hypertension    Hypoxemia    history of - no home O2   IBS (irritable bowel syndrome)    Morbid obesity (HCC)    Obesity hypoventilation syndrome (HCC)    Pneumonia    Pre-diabetes    Prediabetes    Seasonal allergies     current runny nose   Seizures (HCC)    febrile seizure x 1 as a child. Several times   Sleep apnea sleep study 07/02/2010   uses CPAP nightly   Spinal stenosis in cervical region    Urinary incontinence     Past Surgical History:  Procedure Laterality Date   APPLICATION OF INTRAOPERATIVE CT SCAN N/A 05/14/2019   Procedure: APPLICATION OF INTRAOPERATIVE CT SCAN;  Surgeon: Erline Levine, MD;  Location: [place];  Service: Neurosurgery;  Laterality: N/A;   CARPAL TUNNEL RELEASE  06/27/2011   Procedure: CARPAL TUNNEL RELEASE;  Surgeon: Wynonia Sours, MD;  Location: [place];  Service: Orthopedics;  Laterality: Left;   CARPAL TUNNEL RELEASE  12/19/2011   Procedure: CARPAL TUNNEL RELEASE;  Surgeon: Wynonia Sours, MD;  Location: [facility];  Service: Orthopedics;  Laterality: Right;   INCISION AND DRAINAGE ABSCESS Left 08/27/2017   Procedure: INCISION AND DRAINAGE LEFT BUTTOCK  ABSCESS;  Surgeon: Excell Seltzer, MD;  Location: [facility];  Service: General;  Laterality: Left;   IR SINUS/FIST TUBE CHK-NON GI  01/03/2022   IR SINUS/FIST TUBE CHK-NON GI  01/03/2022   IR SINUS/FIST TUBE CHK-NON GI  01/03/2022   IR SINUS/FIST TUBE CHK-NON GI  01/03/2022   IRRIGATION AND DEBRIDEMENT BUTTOCKS Left 08/30/2017   Procedure: IRRIGATION AND DEBRIDEMENT RE-EXCISION OF LEFT SUBCUTANEOUS BUTTOCKS ABCESS;  Surgeon: Mickeal Skinner, MD;  Location: [facility];  Service: General;  Laterality:  Left;   LAPAROSCOPY N/A 12/11/2021   Procedure: LAPAROSCOPIC PERITONEAL LAVAGE WITH DRAIN PLACEMENT;  Surgeon: Felicie Morn, MD;  Location: [place];  Service: General;  Laterality: N/A;   MASS EXCISION  08/07/2010   right index   POSTERIOR CERVICAL FUSION/FORAMINOTOMY  09/11/2011   Procedure: POSTERIOR CERVICAL FUSION/FORAMINOTOMY LEVEL 1;  Surgeon: Erline Levine, MD;  Location: [facility];  Service: Neurosurgery;  Laterality: N/A;  Cervical Three-Four Posteior Cervical Fusion and Decompression.    POSTERIOR CERVICAL FUSION/FORAMINOTOMY N/A 05/14/2019   Procedure: Posterior cervical decompression/fusion Cervical four to Thoracic one with exploration/revision of Cervical threee-four fusion;  Surgeon: Erline Levine, MD;  Location: [facility];  Service: Neurosurgery;  Laterality: N/A;   TRIGGER FINGER RELEASE  12/19/2011   Procedure: RELEASE TRIGGER FINGER/A-1 PULLEY;  Surgeon: Wynonia Sours, MD;  Location: [facility] [place];  Service: Orthopedics;  Laterality: Right;   TRIGGER FINGER RELEASE Left 12/31/2012   Procedure: RELEASE A-1 PULLEY LEFT THUMB;  Surgeon: Wynonia Sours, MD;  Location: [facility] [place];  Service: Orthopedics;  Laterality: Left;   WISDOM TOOTH EXTRACTION      Allergies: Savella [milnacipran hcl]  Medications: Prior to Admission medications   Medication Sig Start Date End Date Taking? Authorizing Provider  albuterol (VENTOLIN HFA) 108 (90 Base) MCG/ACT inhaler Inhale 2 puffs into the lungs every 6 (six) hours as needed for wheezing or shortness of breath. 10/06/20   Rigoberto Noel, MD  ALPRAZolam Duanne Moron) 1 MG tablet Take 1 mg by mouth 3 (three) times daily.    [provider]  amphetamine-dextroamphetamine (ADDERALL) 20 MG tablet Take 20 mg by mouth in the morning, at noon, in the evening, and at bedtime.     [provider]  Ascorbic Acid (VITAMIN C) 500 MG tablet Take 1,000 mg by mouth daily.    [provider]  aspirin 81 MG tablet Take 1 tablet (81 mg total) by mouth daily. 05/21/19   Costella, Vista Mink, PA-C  atorvastatin (LIPITOR) 20 MG tablet TAKE 1 TABLET BY MOUTH EVERY DAY IN THE EVENING Patient taking differently: Take 20 mg by mouth daily. 03/15/21   Minus Breeding, MD  b complex vitamins tablet Take 1 tablet by mouth daily.    [provider]  Blood Glucose Monitoring Suppl (ACCU-CHEK AVIVA PLUS) w/Device KIT As directed. 11/14/21   Martinique, Betty G, MD  Calcium Citrate (CITRACAL PO) Take 2 tablets by mouth daily.     [provider]  Cholecalciferol (VITAMIN D3) 5000 UNITS TABS Take 10,000 Units by mouth in the morning and at bedtime.    [provider]  Chromium Picolinate 500 MCG TABS Take 500 mcg by mouth daily.     [provider]  diclofenac Sodium (VOLTAREN) 1 % GEL Apply 2 g topically 3 (three) times daily as needed (knee pain).    [provider]  furosemide (LASIX) 40 MG tablet Take 2 tablets (80 mg total) by mouth 2 (two) times daily. Patient taking differently: Take 40-80 mg by mouth See admin instructions. Take 80 mg in the morning and 40 mg in the evening 12/05/21   Minus Breeding, MD  [name] 1.5/30 1.5-30 MG-MCG tablet Take 1 tablet by mouth at bedtime. 08/03/19   [provider]  levalbuterol Penne Lash HFA) 45 MCG/ACT inhaler Inhale 2 puffs into the lungs every 6 (six) hours as needed for wheezing. 10/14/20   Rigoberto Noel, MD  lidocaine (LIDODERM) 5 % PLACE 1 PATCH ONTO THE SKIN DAILY  AS NEEDED FOR KNEE PAIN. REMOVE AND DISCARD PATCH WITHIN 12 HOURS OR AS DIRECTED BY DOCTOR. Patient taking differently: Place 1 patch onto the skin as needed (knee pain). 05/22/21   Martinique, Betty G, MD  losartan (COZAAR) 25 MG tablet Take 1 tablet (25 mg total) by mouth 2 (two) times daily. 12/05/21   Minus Breeding, MD  LYRICA 300 MG capsule Take 300 mg by mouth 2 (two) times daily.  03/26/11   [provider]  MAGNESIUM PO Take 500 mg by mouth daily.     [provider]  Multiple Vitamin (MULTIVITAMIN) tablet Take 1 tablet by mouth daily.    [provider]  mupirocin ointment (BACTROBAN) 2 % Apply 1 Application topically 2 (two) times daily for 14 days. 01/15/22 01/29/22  Martinique, Betty G, MD  NUEDEXTA 20-10 MG CAPS Take 1 capsule by mouth every 12 (twelve) hours.  09/27/16   [provider]  Omega-3 Fatty Acids (FISH OIL) 1200 MG CAPS Take 2,400 mg by mouth daily.     [provider]  oxyCODONE (OXY IR/ROXICODONE) 5 MG immediate  release tablet Take 1 tablet (5 mg total) by mouth every 4 (four) hours as needed for moderate pain. 01/05/22   Saverio Danker, PA-C  potassium chloride SA (KLOR-CON M20) 20 MEQ tablet Take 1 tablet (20 mEq total) by mouth daily. 04/14/21   Minus Breeding, MD  VRAYLAR 6 MG CAPS Take 6 mg by mouth every evening.  09/03/16   [provider]  Zinc 50 MG CAPS Take 50 mg by mouth in the morning and at bedtime.    [provider]     Family History  Problem Relation Age of Onset   Asthma Mother    Allergic rhinitis Mother    Multiple sclerosis Mother    Asthma Maternal Grandmother    Breast cancer Neg Hx     Social History   Socioeconomic History   Marital status: Married    Spouse name: Not on file   Number of children: Not on file   Years of education: Not on file   Highest education level: Bachelor's degree (e.g., BA, AB, BS)  Occupational History   Occupation: currently unemployed  Tobacco Use   Smoking status: Never   Smokeless tobacco: Never  Substance and Sexual Activity   Alcohol use: Yes    Comment: rare   Drug use: No   Sexual activity: Not on file  Other Topics Concern   Not on file  Social History Narrative   Lives in [place] Strain: Medium Risk (11/13/2021)   Overall Financial Resource Strain (CARDIA)    Difficulty of Paying Living Expenses: Somewhat hard  Food Insecurity: No Food Insecurity (12/13/2021)   Hunger Vital Sign    Worried About Running Out of Food in the Last Year: Never true    Ran Out of Food in the Last Year: Never true  Transportation Needs: No Transportation Needs (12/13/2021)   PRAPARE - Hydrologist (Medical): No    Lack of Transportation (Non-Medical): No  Physical Activity: Insufficiently Active (11/13/2021)   Exercise Vital Sign    Days of Exercise per Week: 1 day    Minutes of Exercise per Session: 10 min  Stress: Stress  Concern Present (11/13/2021)   [place]    Feeling of Stress : Very much  Social Connections: Moderately Isolated (11/13/2021)   Social Connection and Isolation Panel [NHANES]    Frequency of Communication with Friends and Family: More than three times a week    Frequency of Social Gatherings with Friends and Family: More than three times a week    Attends Religious Services: 1 to 4 times per year    Active Member of [employer] or Organizations: No    Attends Archivist Meetings: Never    Marital Status: Widowed    Review of Systems: A 12 point ROS discussed and pertinent positives are indicated in the HPI above.  All other systems are negative.  Review of Systems  Vital Signs: BP 128/69 (BP Location: Left Arm)   Pulse 81   Temp 97.7 F (36.5 C) (Oral)   SpO2 95%     Physical Exam Constitutional:      General: She is not in acute distress.    Appearance: Normal appearance. She is obese.  HENT:     Head: Normocephalic and atraumatic.  Eyes:     General: No scleral icterus. Cardiovascular:     Rate and Rhythm: Normal rate.  Pulmonary:     Effort: Pulmonary effort is normal.  Abdominal:     Tenderness: There is no abdominal tenderness.  Skin:    General: Skin is warm and dry.  Neurological:     Mental Status: She is alert and oriented to person, place, and time.  Psychiatric:        Mood and Affect: Mood normal.        Behavior: Behavior normal.      Labs:  CBC: Recent Labs    12/29/21 0824 01/02/22 0530 01/03/22 0301 01/15/22 1438  WBC 8.7 8.9 9.4 7.9  HGB 8.9* 9.4* 9.5* 12.1  HCT 31.3* 30.4* 29.9* 37.7  PLT 331 383 376 541.0*    COAGS: No results for input(s): "INR", "APTT" in the last 8760 hours.  BMP: Recent Labs    12/31/21 0315 01/01/22 0517 01/02/22 0530 01/03/22 0301 01/15/22 1438  NA 139 138 138 140 142  K 4.5 4.3 4.3 4.4 3.8  CL 100 99 100 96* 101  CO2 _0 GLUCOSE 211* 201* 177* 181* 140*  BUN _1 CALCIUM 8.6* 8.5* 8.5* 9.4 9.9  CREATININE 0.65 0.60 0.60 0.72 0.71  GFRNONAA >60 >60 >60 >60  --     LIVER FUNCTION TESTS: Recent Labs    12/24/21 0412 12/25/21 0411 12/28/21 0948 01/01/22 0517  BILITOT <0.1* 0.4 <0.1* 0.2*  AST 20 19 34 29  ALT _2 ALKPHOS 87 74 92 98  PROT 5.4* 5.3* 6.1* 6.0*  ALBUMIN 1.5* <1.5* 1.7* 1.5*    TUMOR MARKERS: No results for input(s): "AFPTM", "CEA", "CA199", "CHROMGRNA" in the last 8760 hours.  Assessment and Plan:  51 year old female with a history of complicated diverticulitis and bowel perforation complicated by formation of multiple intra-abdominal abscesses most of which have resolved.    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  1.)  Return for repeat drain injection and evaluation in 1 month.    Electronically Signed: Criselda Peaches 01/16/2022, 1:45 PM   I spent a total of 10 Minutes in face to face in clinical consultation, greater than 50% of which was counseling/coordinating care for diverticular abscess with drain in place complicated by fistula.

## 2022-01-17 ENCOUNTER — Telehealth: Payer: Self-pay | Admitting: Family Medicine

## 2022-01-17 NOTE — Telephone Encounter (Signed)
-----   Message from Betty G Martinique, MD sent at 01/15/2022 10:10 PM EST ----- Follow up in 03/2022. Thanks, [name]

## 2022-01-17 NOTE — Telephone Encounter (Signed)
Called patient to schedule follow up. Patient stated she would call back to schedule as now is not a good time. Nothing further needed.     FYI

## 2022-01-19 ENCOUNTER — Telehealth: Payer: Self-pay | Admitting: Adult Health

## 2022-01-19 NOTE — Telephone Encounter (Signed)
PT was in hospital that is why she missed her appt. I will resched she/her/hers wanted [place] to know.

## 2022-01-19 NOTE — Telephone Encounter (Signed)
Noted from clinical staff. And I updated [name] nurse verbally of why patient canceled but that she is rescheduled for Feb with Tammy. Nothing further needed

## 2022-01-23 ENCOUNTER — Ambulatory Visit (INDEPENDENT_AMBULATORY_CARE_PROVIDER_SITE_OTHER): Payer: Medicare PPO | Admitting: Psychology

## 2022-01-23 ENCOUNTER — Ambulatory Visit: Payer: Medicare Other | Admitting: Psychology

## 2022-01-23 ENCOUNTER — Ambulatory Visit: Payer: BC Managed Care – PPO | Admitting: Adult Health

## 2022-01-23 DIAGNOSIS — F4321 Adjustment disorder with depressed mood: Secondary | ICD-10-CM | POA: Diagnosis not present

## 2022-01-23 NOTE — Progress Notes (Addendum)
[name] is a 51 y.o. female patient   Treatment Plan: Date: 01/23/2022  Diagnosis 296.80 (Unspecified bipolar or related disorder) [n/a]  Symptoms Depressed or irritable mood. (Status: maintained) -- No Description Entered  Diminished interest in or enjoyment of activities. (Status: maintained) -- No Description Entered  Lack of energy. (Status: maintained) -- No Description Entered  Low self-esteem. (Status: maintained) -- No Description Entered  Medication Status compliance  Safety none  If Suicidal or Homicidal State Action Taken: unspecified  Current Risk: low Medications Adderall (Dosage: '20mg'$  4X/day)  Klonapin (Dosage: '1mg'$  3X/day)  Lamictal (Dosage: '200mg'$  2x/day)  Nuedexta (Dosage: '20mg'$ )  Vyralar (Dosage: '6mg'$ )  Xanax (Dosage: '1mg'$  3X/day)  Objectives Related Problem: Develop healthy cognitive patterns and beliefs about self and the world that lead to alleviation and help prevent the relapse of mood episodes. Description: State no longer having thoughts of self-harm. Target Date: 2023-01-08 Frequency: Daily Modality: individual Progress: 80%  Related Problem: Develop healthy cognitive patterns and beliefs about self and the world that lead to alleviation and help prevent the relapse of mood episodes. Description: Verbalize any history of past and present suicidal thoughts and actions. Target Date: 2023-01-08 Frequency: Daily Modality: individual Progress: 80%  Related Problem: Develop healthy cognitive patterns and beliefs about self and the world that lead to alleviation and help prevent the relapse of mood episodes. Description: Take prescribed medications as directed. Target Date: 2023-01-08 Frequency: Daily Modality: individual Progress: 90%  Related Problem: Develop healthy cognitive patterns and beliefs about self and the world that lead to alleviation and help prevent the relapse of mood episodes. Description: Discuss and resolve troubling  personal and interpersonal issues. Target Date: 2023-01-08 Frequency: Daily Modality: individual Progress: 14% Resolve complicated grief       Target Date: 12-24       Progress: 20%  Client Response full compliance  Service Location Location, [name] [place], [place] [address] -virtual Service Code cpt [id number]  Emotion regulation skills  Related past to present  Rationally challenge thoughts or beliefs/cognitive restructuring  Identify/label emotions  Validate/empathize  Self care activities  Lifestyle change (exercise, nutrition)  Self-monitoring  Facilitate problem solving  Session notes:  f31.9 Bipolar Unspecified.  Goals: Mood stabilization, improved marital relationship, improve self esteem and elimination of self harm thoughts/tendencies. Also, patient struggles with leaving home comfortably and seeks to improve this ability. Target date for these goals is January, 2023. In addition, she needs to improve self care behaviors due to her weight and various medical conditions. Target date for this goal is Feb., 2022. Date revised to December, 2023 to allow time for behavioral implementation. Reports periodic outbursts of rage/anger. Seeks to minimize. Target date is December, 2023. Patient has realized improvement in all areas. Requests additional treatment to facilitate maintenance of progress. Goal date 12-24. Patient's husband died unexpectedly and she is suffering complicated grief. She needs additional counseling to address associated issues and manage feelings of loss, despair and anxiety. Goal date 12-24.  Meds: Vyralar ('6mg'$ ),Nuedexta (40+'20mg'$ /day), Adderall ('20mg'$  4X/day), Xanax ('1mg'$ , 3X/day), and Klonopin ('1mg'$  3X/day).  Patient has requested a video session. She is at home and I am in my home office.  [name] texted me this morning that her mother passed away. She was with her when she passed. [name] is reporting that she is "okay" and will be talking with family  about how to support father. Her friend has been staying with her since the 20th. Her biological mother had died on  Christmas as well. She says she still has an abdominal drain. Claims "it is going to be a long haul". Meets with surgeon on Jan. 3rd and will find out more about the possibility of having a colostomy. Meets with radiologist on the 17th. She has lost 54 pounds since her surgery.                                                   [name]                                                                                                                                                                                        [name], PhD  Time:2:10p-3:00p 50 minutes.                           n

## 2022-01-30 ENCOUNTER — Ambulatory Visit (INDEPENDENT_AMBULATORY_CARE_PROVIDER_SITE_OTHER): Payer: Medicare PPO | Admitting: Psychology

## 2022-01-30 DIAGNOSIS — F4321 Adjustment disorder with depressed mood: Secondary | ICD-10-CM

## 2022-01-30 DIAGNOSIS — F319 Bipolar disorder, unspecified: Secondary | ICD-10-CM | POA: Diagnosis not present

## 2022-01-30 NOTE — Progress Notes (Addendum)
NYLEE BARBUTO is a 52 y.o. female patient   Treatment Plan: Date: 01/30/2022  Diagnosis 296.80 (Unspecified bipolar or related disorder) [n/a]  Symptoms Depressed or irritable mood. (Status: maintained) -- No Description Entered  Diminished interest in or enjoyment of activities. (Status: maintained) -- No Description Entered  Lack of energy. (Status: maintained) -- No Description Entered  Low self-esteem. (Status: maintained) -- No Description Entered  Medication Status compliance  Safety none  If Suicidal or Homicidal State Action Taken: unspecified  Current Risk: low Medications Adderall (Dosage: '20mg'$  4X/day)  Klonapin (Dosage: '1mg'$  3X/day)  Lamictal (Dosage: '200mg'$  2x/day)  Nuedexta (Dosage: '20mg'$ )  Vyralar (Dosage: '6mg'$ )  Xanax (Dosage: '1mg'$  3X/day)  Objectives Related Problem: Develop healthy cognitive patterns and beliefs about self and the world that lead to alleviation and help prevent the relapse of mood episodes. Description: State no longer having thoughts of self-harm. Target Date: 2023-01-08 Frequency: Daily Modality: individual Progress: 80%  Related Problem: Develop healthy cognitive patterns and beliefs about self and the world that lead to alleviation and help prevent the relapse of mood episodes. Description: Verbalize any history of past and present suicidal thoughts and actions. Target Date: 2023-01-08 Frequency: Daily Modality: individual Progress: 80%  Related Problem: Develop healthy cognitive patterns and beliefs about self and the world that lead to alleviation and help prevent the relapse of mood episodes. Description: Take prescribed medications as directed. Target Date: 2023-01-08 Frequency: Daily Modality: individual Progress: 90%  Related Problem: Develop healthy cognitive patterns and beliefs about self and the world that lead to alleviation and help prevent the relapse of mood episodes. Description: Discuss and resolve troubling  personal and interpersonal issues. Target Date: 2023-01-08 Frequency: Daily Modality: individual Progress: 24% Resolve complicated grief       Target Date: 12-24       Progress: 20%  Client Response full compliance  Service Location Location, [name] [place], [place] [address] -virtual Service Code cpt [phone]  Emotion regulation skills  Related past to present  Rationally challenge thoughts or beliefs/cognitive restructuring  Identify/label emotions  Validate/empathize  Self care activities  Lifestyle change (exercise, nutrition)  Self-monitoring  Facilitate problem solving  Session notes:  f31.9 Bipolar Unspecified.  Goals: Mood stabilization, improved marital relationship, improve self esteem and elimination of self harm thoughts/tendencies. Also, patient struggles with leaving home comfortably and seeks to improve this ability. Target date for these goals is January, 2023. In addition, she needs to improve self care behaviors due to her weight and various medical conditions. Target date for this goal is Feb., 2022. Date revised to December, 2023 to allow time for behavioral implementation. Reports periodic outbursts of rage/anger. Seeks to minimize. Target date is December, 2023. Patient has realized improvement in all areas. Requests additional treatment to facilitate maintenance of progress. Goal date 12-24. Patient's husband died unexpectedly and she is suffering complicated grief. She needs additional counseling to address associated issues and manage feelings of loss, despair and anxiety. Goal date 12-24.  Meds: Vyralar ('6mg'$ ),Nuedexta (40+'20mg'$ /day), Adderall ('20mg'$  4X/day), Xanax ('1mg'$ , 3X/day), and Klonopin ('1mg'$  3X/day).  Patient has requested a video session. She is at home and I am in my home office.  Wendy tested positive for Covid 19. She got it from her friend Shirlean Mylar. Her nurse will come visit tomorrow, but Shawni had to reschedule her visit with the surgeon until  the 19th. She now cannot have people visit to help her out. She has no appetite and is coughing. Family is  trying to help/support. We discussed ways to try to maintain self-care and how to "pass the time" while quarantined. States she is relatively stable given her circumstances.                                                      [name], PhD  Time:2:15p-3:00p 45 minutes.                           n

## 2022-02-06 ENCOUNTER — Ambulatory Visit (INDEPENDENT_AMBULATORY_CARE_PROVIDER_SITE_OTHER): Payer: Medicare PPO | Admitting: Psychology

## 2022-02-06 DIAGNOSIS — F319 Bipolar disorder, unspecified: Secondary | ICD-10-CM

## 2022-02-06 NOTE — Progress Notes (Signed)
[name] is a 52 y.o. female patient   Treatment Plan: Date: 02/06/2022  Diagnosis 296.80 (Unspecified bipolar or related disorder) [n/a]  Symptoms Depressed or irritable mood. (Status: maintained) -- No Description Entered  Diminished interest in or enjoyment of activities. (Status: maintained) -- No Description Entered  Lack of energy. (Status: maintained) -- No Description Entered  Low self-esteem. (Status: maintained) -- No Description Entered  Medication Status compliance  Safety none  If Suicidal or Homicidal State Action Taken: unspecified  Current Risk: low Medications Adderall (Dosage: '20mg'$  4X/day)  Klonapin (Dosage: '1mg'$  3X/day)  Lamictal (Dosage: '200mg'$  2x/day)  Nuedexta (Dosage: '20mg'$ )  Vyralar (Dosage: '6mg'$ )  Xanax (Dosage: '1mg'$  3X/day)  Objectives Related Problem: Develop healthy cognitive patterns and beliefs about self and the world that lead to alleviation and help prevent the relapse of mood episodes. Description: State no longer having thoughts of self-harm. Target Date: 2022-01-07 Frequency: Daily Modality: individual Progress: 80%  Related Problem: Develop healthy cognitive patterns and beliefs about self and the world that lead to alleviation and help prevent the relapse of mood episodes. Description: Verbalize any history of past and present suicidal thoughts and actions. Target Date: 2023-01-08 Frequency: Daily Modality: individual Progress: 80%  Related Problem: Develop healthy cognitive patterns and beliefs about self and the world that lead to alleviation and help prevent the relapse of mood episodes. Description: Take prescribed medications as directed. Target Date: 2023-01-08 Frequency: Daily Modality: individual Progress: 90%  Related Problem: Develop healthy cognitive patterns and beliefs about self and the world that lead to alleviation and help prevent the relapse of mood episodes. Description:  Discuss and resolve troubling personal and interpersonal issues. Target Date: 2023-01-08 Frequency: Daily Modality: individual Progress: 10% Resolve complicated grief       Target Date: 12-24       Progress: 20%  Client Response full compliance  Service Location Location, [name] [place], [place] [address] -virtual Service Code cpt [id number]  Emotion regulation skills  Related past to present  Rationally challenge thoughts or beliefs/cognitive restructuring  Identify/label emotions  Validate/empathize  Self care activities  Lifestyle change (exercise, nutrition)  Self-monitoring  Facilitate problem solving  Session notes:  f31.9 Bipolar Unspecified.  Goals: Mood stabilization, improved marital relationship, improve self esteem and elimination of self harm thoughts/tendencies. Also, patient struggles with leaving home comfortably and seeks to improve this ability. Target date for these goals is January, 2023. In addition, she needs to improve self care behaviors due to her weight and various medical conditions. Target date for this goal is Feb., 2022. Date revised to December, 2023 to allow time for behavioral implementation. Reports periodic outbursts of rage/anger. Seeks to minimize. Target date is December, 2023. Patient has realized improvement in all areas. Requests additional treatment to facilitate maintenance of progress. Goal date 12-24. Patient's husband died unexpectedly and she is suffering complicated grief. She needs additional counseling to address associated issues and manage feelings of loss, despair and anxiety. Goal date 12-24.  Meds: Vyralar ('6mg'$ ),Nuedexta (40+'20mg'$ /day), Adderall ('20mg'$  4X/day), Xanax ('1mg'$ , 3X/day), and Klonopin ('1mg'$  3X/day).  Patient has requested a video session. She is at home and I am in my home office.  [name] has not re-tested for Covid 19. She is isolating, which is not good under her current state. Encouraged her to test so she can be  around others. She needs the support of family/friends. She states  she is "about to run out of food". I continually stress to her the need to sign up for a delivery service for supplies and food. She says that she can feel that she is "getting better" from the virus. During session she tested negative, and agrees to reach out to friends for support. She reports that she has been feeling lonely. She will now reach out to see others. Most likely call [name], who has been a big support. She also needs to get out of her house. She has not left the house with the exception of going to the doctor's office.                                                           [name]                                                                                                                                                                                        Jessica Morel, PhD  Time:2:15p-3:00p 45 minutes.                           n

## 2022-02-13 ENCOUNTER — Ambulatory Visit (INDEPENDENT_AMBULATORY_CARE_PROVIDER_SITE_OTHER): Payer: Medicare PPO | Admitting: Psychology

## 2022-02-13 DIAGNOSIS — F319 Bipolar disorder, unspecified: Secondary | ICD-10-CM | POA: Diagnosis not present

## 2022-02-13 NOTE — Progress Notes (Addendum)
[name] is a 52 y.o. female patient   Treatment Plan: Date: 02/13/2022  Diagnosis 296.80 (Unspecified bipolar or related disorder) [n/a]  Symptoms Depressed or irritable mood. (Status: maintained) -- No Description Entered  Diminished interest in or enjoyment of activities. (Status: maintained) -- No Description Entered  Lack of energy. (Status: maintained) -- No Description Entered  Low self-esteem. (Status: maintained) -- No Description Entered  Medication Status compliance  Safety none  If Suicidal or Homicidal State Action Taken: unspecified  Current Risk: low Medications Adderall (Dosage: '20mg'$  4X/day)  Klonapin (Dosage: '1mg'$  3X/day)  Lamictal (Dosage: '200mg'$  2x/day)  Nuedexta (Dosage: '20mg'$ )  Vyralar (Dosage: '6mg'$ )  Xanax (Dosage: '1mg'$  3X/day)  Objectives Related Problem: Develop healthy cognitive patterns and beliefs about self and the world that lead to alleviation and help prevent the relapse of mood episodes. Description: State no longer having thoughts of self-harm. Target Date: 2023-01-08 Frequency: Daily Modality: individual Progress: 80%  Related Problem: Develop healthy cognitive patterns and beliefs about self and the world that lead to alleviation and help prevent the relapse of mood episodes. Description: Verbalize any history of past and present suicidal thoughts and actions. Target Date: 2023-01-08 Frequency: Daily Modality: individual Progress: 80%  Related Problem: Develop healthy cognitive patterns and beliefs about self and the world that lead to alleviation and help prevent the relapse of mood episodes. Description: Take prescribed medications as directed. Target Date: 2023-01-08 Frequency: Daily Modality: individual Progress: 90%  Related Problem: Develop healthy cognitive patterns and beliefs about self and the world that lead to alleviation and help prevent the relapse of  mood episodes. Description: Discuss and resolve troubling personal and interpersonal issues. Target Date: 2023-01-08 Frequency: Daily Modality: individual Progress: 33% Resolve complicated grief       Target Date: 12-24       Progress: 20%  Client Response full compliance  Service Location Location, [name] [place], [place] [address] -virtual Service Code cpt [id number]  Emotion regulation skills  Related past to present  Rationally challenge thoughts or beliefs/cognitive restructuring  Identify/label emotions  Validate/empathize  Self care activities  Lifestyle change (exercise, nutrition)  Self-monitoring  Facilitate problem solving  Session notes:  f31.9 Bipolar Unspecified.  Goals: Mood stabilization, improved marital relationship, improve self esteem and elimination of self harm thoughts/tendencies. Also, patient struggles with leaving home comfortably and seeks to improve this ability. Target date for these goals is January, 2023. In addition, she needs to improve self care behaviors due to her weight and various medical conditions. Target date for this goal is Feb., 2022. Date revised to December, 2023 to allow time for behavioral implementation. Reports periodic outbursts of rage/anger. Seeks to minimize. Target date is December, 2023. Patient has realized improvement in all areas. Requests additional treatment to facilitate maintenance of progress. Goal date 12-24. Patient's husband died unexpectedly and she is suffering complicated grief. She needs additional counseling to address associated issues and manage feelings of loss, despair and anxiety. Goal date 12-24.  Meds: Vyralar ('6mg'$ ),Nuedexta (40+'20mg'$ /day), Adderall ('20mg'$  4X/day), Xanax ('1mg'$ , 3X/day), and Klonopin ('1mg'$  3X/day).  Patient has requested a video session. She is at home and I am in my home office.  [name] says she "woke up feeling weak". She is having trouble standing for a long period. She is scheduled to  see  a doctor tomorrow. She states she has now lost over 100 pounds. She will not make arrangements to get food delivered to her house and cannot express why she is avoiding this issue. Is shew avoiding to keep others more involved in her life. She rejects this notion and claims she will consider a delivery service. She did have some friends drop off some groceries last week. She said that her father is having a hard time with the loss of her mother. [name] says she is doing better this week with her grief over the loss of her mother.                                                                  [name]                                                                                                                                                                                        [name], PhD  Time:2:15p-3:00p 45 minutes.                           n

## 2022-02-14 ENCOUNTER — Ambulatory Visit
Admission: RE | Admit: 2022-02-14 | Discharge: 2022-02-14 | Disposition: A | Discharge status: Home / Self Care | Payer: Medicare PPO | Source: Ambulatory Visit | Attending: Surgery | Admitting: Surgery

## 2022-02-14 DIAGNOSIS — K5720 Diverticulitis of large intestine with perforation and abscess without bleeding: Secondary | ICD-10-CM

## 2022-02-14 DIAGNOSIS — K632 Fistula of intestine: Secondary | ICD-10-CM | POA: Diagnosis not present

## 2022-02-14 DIAGNOSIS — Z4682 Encounter for fitting and adjustment of non-vascular catheter: Secondary | ICD-10-CM | POA: Diagnosis not present

## 2022-02-14 MED ORDER — IOPAMIDOL (ISOVUE-370) INJECTION 76%
10.0000 mL | Freq: Once | INTRAVENOUS | Status: AC | PRN
  Administered 2022-02-14: 10 mL

## 2022-02-14 NOTE — Progress Notes (Signed)
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 female With a history of perforated diverticulitis status post exploratory laparotomy and peritoneal lavage which was complicated by formation of multiple intra-abdominal abscesses.  A total of 4 drainage catheters were placed between 11/13 and 11/22.  3 of the drainage catheters were successfully removed.  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  Patient presents today for repeat drain injection.   Drain output remains feculent.  Output is relatively small at 10 mL daily but remains brown and malodorous. Patient no longer flushing drainage catheter.  She denies fever/chills or N/V.  She reports normal bowel movements.  She has a follow up appointment with her surgeon on Dr. Thermon Leyland on 02/16/22   Drain injection today relatively unchanged, with persistent fistulous communication with the sigmoid colon.      Past Medical History:  Diagnosis Date   Acute systolic congestive heart failure (HCC)    Agoraphobia    Anxiety    panic attacks   Arthritis    osteoarthritis bilal. knees   Asthma    no per PFT 7/13; reports does not have asthma   Bipolar disorder ([place])    Carpal tunnel syndrome of left wrist 05/2011   Being evaluated for MS   Cor pulmonale ([place])    Depression    History of pleural effusion    Hyperlipidemia    Hypertension    Hypoxemia    history of - no home O2   IBS (irritable bowel syndrome)    Morbid obesity (HCC)    Obesity hypoventilation syndrome (HCC)    Pneumonia    Pre-diabetes    Prediabetes    Seasonal allergies    current runny nose   Seizures (HCC)    febrile seizure x 1 as a child. Several times   Sleep apnea sleep study 07/02/2010    uses CPAP nightly   Spinal stenosis in cervical region    Urinary incontinence     Past Surgical History:  Procedure Laterality Date   APPLICATION OF INTRAOPERATIVE CT SCAN N/A 05/14/2019   Procedure: APPLICATION OF INTRAOPERATIVE CT SCAN;  Surgeon: Erline Levine, MD;  Location: [facility];  Service: Neurosurgery;  Laterality: N/A;   CARPAL TUNNEL RELEASE  06/27/2011   Procedure: CARPAL TUNNEL RELEASE;  Surgeon: Wynonia Sours, MD;  Location: [facility];  Service: Orthopedics;  Laterality: Left;   CARPAL TUNNEL RELEASE  12/19/2011   Procedure: CARPAL TUNNEL RELEASE;  Surgeon: Wynonia Sours, MD;  Location: [facility];  Service: Orthopedics;  Laterality: Right;   INCISION AND DRAINAGE ABSCESS Left 08/27/2017   Procedure: INCISION AND DRAINAGE LEFT BUTTOCK  ABSCESS;  Surgeon: Excell Seltzer, MD;  Location: [facility];  Service: General;  Laterality: Left;   IR SINUS/FIST TUBE CHK-NON GI  01/03/2022   IR SINUS/FIST TUBE CHK-NON GI  01/03/2022   IR SINUS/FIST TUBE CHK-NON GI  01/03/2022   IR SINUS/FIST TUBE CHK-NON GI  01/03/2022   IRRIGATION AND DEBRIDEMENT BUTTOCKS Left 08/30/2017   Procedure: IRRIGATION AND DEBRIDEMENT RE-EXCISION OF LEFT SUBCUTANEOUS BUTTOCKS ABCESS;  Surgeon: Mickeal Skinner, MD;  Location: [facility];  Service: General;  Laterality: Left;   LAPAROSCOPY N/A 12/11/2021   Procedure: LAPAROSCOPIC PERITONEAL LAVAGE WITH DRAIN PLACEMENT;  Surgeon: Felicie Morn, MD;  Location: [facility];  Service: General;  Laterality: N/A;   MASS EXCISION  08/07/2010   right index   POSTERIOR CERVICAL FUSION/FORAMINOTOMY  09/11/2011   Procedure: POSTERIOR CERVICAL FUSION/FORAMINOTOMY LEVEL 1;  Surgeon: Erline Levine, MD;  Location: [facility];  Service: Neurosurgery;  Laterality: N/A;  Cervical Three-Four Posteior Cervical Fusion and Decompression.   POSTERIOR CERVICAL FUSION/FORAMINOTOMY N/A 05/14/2019   Procedure: Posterior cervical decompression/fusion Cervical four to  Thoracic one with exploration/revision of Cervical threee-four fusion;  Surgeon: Erline Levine, MD;  Location: [facility];  Service: Neurosurgery;  Laterality: N/A;   TRIGGER FINGER RELEASE  12/19/2011   Procedure: RELEASE TRIGGER FINGER/A-1 PULLEY;  Surgeon: Wynonia Sours, MD;  Location: [facility];  Service: Orthopedics;  Laterality: Right;   TRIGGER FINGER RELEASE Left 12/31/2012   Procedure: RELEASE A-1 PULLEY LEFT THUMB;  Surgeon: Wynonia Sours, MD;  Location: [facility];  Service: Orthopedics;  Laterality: Left;   WISDOM TOOTH EXTRACTION      Allergies: Savella [milnacipran hcl]  Medications: Prior to Admission medications   Medication Sig Start Date End Date Taking? Authorizing Provider  albuterol (VENTOLIN HFA) 108 (90 Base) MCG/ACT inhaler Inhale 2 puffs into the lungs every 6 (six) hours as needed for wheezing or shortness of breath. 10/06/20   Rigoberto Noel, MD  ALPRAZolam Duanne Moron) 1 MG tablet Take 1 mg by mouth 3 (three) times daily.    [provider]  amphetamine-dextroamphetamine (ADDERALL) 20 MG tablet Take 20 mg by mouth in the morning, at noon, in the evening, and at bedtime.     [provider]  Ascorbic Acid (VITAMIN C) 500 MG tablet Take 1,000 mg by mouth daily.    [provider]  aspirin 81 MG tablet Take 1 tablet (81 mg total) by mouth daily. 05/21/19   Costella, Vista Mink, PA-C  atorvastatin (LIPITOR) 20 MG tablet TAKE 1 TABLET BY MOUTH EVERY DAY IN THE EVENING Patient taking differently: Take 20 mg by mouth daily. 03/15/21   Minus Breeding, MD  b complex vitamins tablet Take 1 tablet by mouth daily.    [provider]  Blood Glucose Monitoring Suppl (ACCU-CHEK AVIVA PLUS) w/Device KIT As directed. 11/14/21   Martinique, Betty G, MD  Calcium Citrate (CITRACAL PO) Take 2 tablets by mouth daily.    [provider]  Cholecalciferol (VITAMIN D3) 5000 UNITS TABS Take 10,000 Units by mouth in the morning and at  bedtime.    [provider]  Chromium Picolinate 500 MCG TABS Take 500 mcg by mouth daily.     [provider]  diclofenac Sodium (VOLTAREN) 1 % GEL Apply 2 g topically 3 (three) times daily as needed (knee pain).    [provider]  furosemide (LASIX) 40 MG tablet Take 2 tablets (80 mg total) by mouth 2 (two) times daily. Patient taking differently: Take 40-80 mg by mouth See admin instructions. Take 80 mg in the morning and 40 mg in the evening 12/05/21   Minus Breeding, MD  [name] 1.5/30 1.5-30 MG-MCG tablet Take 1 tablet by mouth at bedtime. 08/03/19   [provider]  levalbuterol Penne Lash HFA) 45 MCG/ACT inhaler Inhale 2 puffs into the lungs every 6 (six) hours as needed for wheezing. 10/14/20   Rigoberto Noel, MD  lidocaine (LIDODERM) 5 % PLACE 1 PATCH ONTO THE SKIN DAILY AS NEEDED FOR KNEE PAIN. REMOVE AND DISCARD PATCH WITHIN 12 HOURS OR AS DIRECTED BY  DOCTOR. Patient taking differently: Place 1 patch onto the skin as needed (knee pain). 05/22/21   Martinique, Betty G, MD  losartan (COZAAR) 25 MG tablet Take 1 tablet (25 mg total) by mouth 2 (two) times daily. 12/05/21   Minus Breeding, MD  LYRICA 300 MG capsule Take 300 mg by mouth 2 (two) times daily.  03/26/11   [provider]  MAGNESIUM PO Take 500 mg by mouth daily.     [provider]  Multiple Vitamin (MULTIVITAMIN) tablet Take 1 tablet by mouth daily.    [provider]  NUEDEXTA 20-10 MG CAPS Take 1 capsule by mouth every 12 (twelve) hours.  09/27/16   [provider]  Omega-3 Fatty Acids (FISH OIL) 1200 MG CAPS Take 2,400 mg by mouth daily.     [provider]  oxyCODONE (OXY IR/ROXICODONE) 5 MG immediate release tablet Take 1 tablet (5 mg total) by mouth every 4 (four) hours as needed for moderate pain. 01/05/22   Saverio Danker, PA-C  potassium chloride SA (KLOR-CON M20) 20 MEQ tablet Take 1 tablet (20 mEq total) by mouth daily. 04/14/21   Minus Breeding, MD   VRAYLAR 6 MG CAPS Take 6 mg by mouth every evening.  09/03/16   [provider]  Zinc 50 MG CAPS Take 50 mg by mouth in the morning and at bedtime.    [provider]     Family History  Problem Relation Age of Onset   Asthma Mother    Allergic rhinitis Mother    Multiple sclerosis Mother    Asthma Maternal Grandmother    Breast cancer Neg Hx     Social History   Socioeconomic History   Marital status: Married    Spouse name: Not on file   Number of children: Not on file   Years of education: Not on file   Highest education level: Bachelor's degree (e.g., BA, AB, BS)  Occupational History   Occupation: currently unemployed  Tobacco Use   Smoking status: Never   Smokeless tobacco: Never  Substance and Sexual Activity   Alcohol use: Yes    Comment: rare   Drug use: No   Sexual activity: Not on file  Other Topics Concern   Not on file  Social History Narrative   Lives in [place] Strain: Medium Risk (11/13/2021)   Overall Financial Resource Strain (CARDIA)    Difficulty of Paying Living Expenses: Somewhat hard  Food Insecurity: No Food Insecurity (12/13/2021)   Hunger Vital Sign    Worried About Running Out of Food in the Last Year: Never true    Ran Out of Food in the Last Year: Never true  Transportation Needs: No Transportation Needs (12/13/2021)   PRAPARE - Hydrologist (Medical): No    Lack of Transportation (Non-Medical): No  Physical Activity: Insufficiently Active (11/13/2021)   Exercise Vital Sign    Days of Exercise per Week: 1 day    Minutes of Exercise per Session: 10 min  Stress: Stress Concern Present (11/13/2021)   [place]    Feeling of Stress : Very much  Social Connections: Moderately Isolated (11/13/2021)   Social Connection and Isolation Panel [NHANES]    Frequency of  Communication with Friends and Family: More than three times a week    Frequency of Social Gatherings with Friends and  Family: More than three times a week    Attends Religious Services: 1 to 4 times per year    Active Member of Clubs or Organizations: No    Attends Archivist Meetings: Never    Marital Status: Widowed     Vital Signs: BP 133/82, Pulse 80, SpO2 97%  Physical Exam Vitals reviewed.  Constitutional:      General: She is not in acute distress. HENT:     Mouth/Throat:     Pharynx: Oropharynx is clear.  Eyes:     General: No scleral icterus.    Extraocular Movements: Extraocular movements intact.  Cardiovascular:     Rate and Rhythm: Normal rate.  Pulmonary:     Effort: Pulmonary effort is normal. No respiratory distress.  Abdominal:     General: There is no distension.     Palpations: Abdomen is soft.  Skin:    General: Skin is warm and dry.  Neurological:     General: No focal deficit present.     Mental Status: She is alert and oriented to person, place, and time.  Psychiatric:        Mood and Affect: Affect is blunt.   Imaging: No results found.  Labs:  CBC: Recent Labs    12/29/21 0824 01/02/22 0530 01/03/22 0301 01/15/22 1438  WBC 8.7 8.9 9.4 7.9  HGB 8.9* 9.4* 9.5* 12.1  HCT 31.3* 30.4* 29.9* 37.7  PLT 331 383 376 541.0*    COAGS: No results for input(s): "INR", "APTT" in the last 8760 hours.  BMP: Recent Labs    12/31/21 0315 01/01/22 0517 01/02/22 0530 01/03/22 0301 01/15/22 1438  NA 139 138 138 140 142  K 4.5 4.3 4.3 4.4 3.8  CL 100 99 100 96* 101  CO2 '30 30 31 30 26  '$ GLUCOSE 211* 201* 177* 181* 140*  BUN '11 12 13 10 10  '$ CALCIUM 8.6* 8.5* 8.5* 9.4 9.9  CREATININE 0.65 0.60 0.60 0.72 0.71  GFRNONAA >60 >60 >60 >60  --     LIVER FUNCTION TESTS: Recent Labs    12/24/21 0412 12/25/21 0411 12/28/21 0948 01/01/22 0517  BILITOT <0.1* 0.4 <0.1* 0.2*  AST 20 19 34 29  ALT '11 12 22 22  '$ ALKPHOS 87 74 92 98   PROT 5.4* 5.3* 6.1* 6.0*  ALBUMIN 1.5* <1.5* 1.7* 1.5*    Assessment:  [name] presents for 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  Despite having d/c flushing and passage of one month, injection today demonstrates the fistula persists.  Patient reports having surgical follow up in 2 days.  Anticipate additional time will not guarantee healing of this fistula and surgical resection may ultimately be necessary.  Have asked patient to continue monitoring output while not flushing the drain.  Will tentatively have patient return to clinic in one month to survey progress of fistula healing. If surgical intervention is planned, this appointment can be canceled.  Patient voices understanding.  SignedPasty Spillers, PA 02/14/2022, 4:17 PM   Please refer to Dr. Moises Blood attestation of this note for management and plan.

## 2022-02-15 ENCOUNTER — Other Ambulatory Visit (HOSPITAL_COMMUNITY): Payer: Self-pay | Admitting: Surgery

## 2022-02-15 DIAGNOSIS — G4733 Obstructive sleep apnea (adult) (pediatric): Secondary | ICD-10-CM | POA: Diagnosis not present

## 2022-02-15 DIAGNOSIS — K5720 Diverticulitis of large intestine with perforation and abscess without bleeding: Secondary | ICD-10-CM

## 2022-02-16 ENCOUNTER — Other Ambulatory Visit (HOSPITAL_COMMUNITY): Payer: Self-pay

## 2022-02-16 MED ORDER — NORMAL SALINE FLUSH 0.9 % IV SOLN
INTRAVENOUS | 1 refills | Status: AC
  Filled 2022-02-16: qty 600, 30d supply, fill #0

## 2022-02-20 ENCOUNTER — Ambulatory Visit (INDEPENDENT_AMBULATORY_CARE_PROVIDER_SITE_OTHER): Payer: Medicare PPO | Admitting: Psychology

## 2022-02-20 DIAGNOSIS — F319 Bipolar disorder, unspecified: Secondary | ICD-10-CM | POA: Diagnosis not present

## 2022-02-20 NOTE — Progress Notes (Addendum)
[name] is a 52 y.o. female patient   Treatment Plan: Date: 02/20/2022  Diagnosis 296.80 (Unspecified bipolar or related disorder) [n/a]  Symptoms Depressed or irritable mood. (Status: maintained) -- No Description Entered  Diminished interest in or enjoyment of activities. (Status: maintained) -- No Description Entered  Lack of energy. (Status: maintained) -- No Description Entered  Low self-esteem. (Status: maintained) -- No Description Entered  Medication Status compliance  Safety none  If Suicidal or Homicidal State Action Taken: unspecified  Current Risk: low Medications Adderall (Dosage: '20mg'$  4X/day)  Klonapin (Dosage: '1mg'$  3X/day)  Lamictal (Dosage: '200mg'$  2x/day)  Nuedexta (Dosage: '20mg'$ )  Vyralar (Dosage: '6mg'$ )  Xanax (Dosage: '1mg'$  3X/day)  Objectives Related Problem: Develop healthy cognitive patterns and beliefs about self and the world that lead to alleviation and help prevent the relapse of mood episodes. Description: State no longer having thoughts of self-harm. Target Date: 2023-01-08 Frequency: Daily Modality: individual Progress: 80%  Related Problem: Develop healthy cognitive patterns and beliefs about self and the world that lead to alleviation and help prevent the relapse of mood episodes. Description: Verbalize any history of past and present suicidal thoughts and actions. Target Date: 2023-01-08 Frequency: Daily Modality: individual Progress: 80%  Related Problem: Develop healthy cognitive patterns and beliefs about self and the world that lead to alleviation and help prevent the relapse of mood episodes. Description: Take prescribed medications as directed. Target Date: 2023-01-08 Frequency: Daily Modality: individual Progress: 90%  Related Problem: Develop healthy cognitive patterns and beliefs about self and the world that lead to alleviation and help prevent the relapse of mood episodes. Description: Discuss and resolve troubling  personal and interpersonal issues. Target Date: 2023-01-08 Frequency: Daily Modality: individual Progress: 16% Resolve complicated grief       Target Date: 12-24       Progress: 20%  Client Response full compliance  Service Location Location, [name] [place], [place] [address] -virtual Service Code cpt [id number]  Emotion regulation skills  Related past to present  Rationally challenge thoughts or beliefs/cognitive restructuring  Identify/label emotions  Validate/empathize  Self care activities  Lifestyle change (exercise, nutrition)  Self-monitoring  Facilitate problem solving  Session notes:  f31.9 Bipolar Unspecified.  Goals: Mood stabilization, improved marital relationship, improve self esteem and elimination of self harm thoughts/tendencies. Also, patient struggles with leaving home comfortably and seeks to improve this ability. Target date for these goals is January, 2023. In addition, she needs to improve self care behaviors due to her weight and various medical conditions. Target date for this goal is Feb., 2022. Date revised to December, 2023 to allow time for behavioral implementation. Reports periodic outbursts of rage/anger. Seeks to minimize. Target date is December, 2023. Patient has realized improvement in all areas. Requests additional treatment to facilitate maintenance of progress. Goal date 12-24. Patient's husband died unexpectedly and she is suffering complicated grief. She needs additional counseling to address associated issues and manage feelings of loss, despair and anxiety. Goal date 12-24.  Meds: Vyralar ('6mg'$ ),Nuedexta (40+'20mg'$ /day), Adderall ('20mg'$  4X/day), Xanax ('1mg'$ , 3X/day), and Klonopin ('1mg'$  3X/day).  Patient has requested a video session. She is at home and I am in my home office.  [name] went to the doctor 2 times last week and she was given permission to drive. She still has to keep the drain in and surgeon told her he wants her to heal, but  needs drain until the end of the year and they will then do surgery to remove part  of her colon. She said she has been eating (friend went to grocery), but having trouble sleeping. She is not talkative and shared very little. Not going out of the house or visiting her father. We talked about self care. Would not address her grief issues.                                                                         [name]                                                                                                                                                                                        [name], PhD  Time:2:15p-3:00p 45 minutes.                           n

## 2022-02-27 ENCOUNTER — Ambulatory Visit: Payer: Medicare PPO | Admitting: Psychology

## 2022-03-06 ENCOUNTER — Ambulatory Visit (INDEPENDENT_AMBULATORY_CARE_PROVIDER_SITE_OTHER): Payer: Medicare PPO | Admitting: Psychology

## 2022-03-06 DIAGNOSIS — F319 Bipolar disorder, unspecified: Secondary | ICD-10-CM

## 2022-03-06 NOTE — Progress Notes (Signed)
[name] is a 52 y.o. female patient   Treatment Plan: Date: 03/06/2022  Diagnosis 296.80 (Unspecified bipolar or related disorder) [n/a]  Symptoms Depressed or irritable mood. (Status: maintained) -- No Description Entered  Diminished interest in or enjoyment of activities. (Status: maintained) -- No Description Entered  Lack of energy. (Status: maintained) -- No Description Entered  Low self-esteem. (Status: maintained) -- No Description Entered  Medication Status compliance  Safety none  If Suicidal or Homicidal State Action Taken: unspecified  Current Risk: low Medications Adderall (Dosage: '20mg'$  4X/day)  Klonapin (Dosage: '1mg'$  3X/day)  Lamictal (Dosage: '200mg'$  2x/day)  Nuedexta (Dosage: '20mg'$ )  Vyralar (Dosage: '6mg'$ )  Xanax (Dosage: '1mg'$  3X/day)  Objectives Related Problem: Develop healthy cognitive patterns and beliefs about self and the world that lead to alleviation and help prevent the relapse of mood episodes. Description: State no longer having thoughts of self-harm. Target Date: 2023-01-08 Frequency: Daily Modality: individual Progress: 80%  Related Problem: Develop healthy cognitive patterns and beliefs about self and the world that lead to alleviation and help prevent the relapse of mood episodes. Description: Verbalize any history of past and present suicidal thoughts and actions. Target Date: 2023-01-08 Frequency: Daily Modality: individual Progress: 80%  Related Problem: Develop healthy cognitive patterns and beliefs about self and the world that lead to alleviation and help prevent the relapse of mood episodes. Description: Take prescribed medications as directed. Target Date: 2023-01-08 Frequency: Daily Modality: individual Progress: 90%  Related Problem: Develop healthy cognitive patterns and beliefs about self and the world that lead to alleviation and help prevent the relapse of mood episodes. Description:  Discuss and resolve troubling personal and interpersonal issues. Target Date: 2023-01-08 Frequency: Daily Modality: individual Progress: 98% Resolve complicated grief       Target Date: 12-24       Progress: 20%  Client Response full compliance  Service Location Location, [name] [place], [place] [address] -virtual Service Code cpt [phone]  Emotion regulation skills  Related past to present  Rationally challenge thoughts or beliefs/cognitive restructuring  Identify/label emotions  Validate/empathize  Self care activities  Lifestyle change (exercise, nutrition)  Self-monitoring  Facilitate problem solving  Session notes:  f31.9 Bipolar Unspecified.  Goals: Mood stabilization, improved marital relationship, improve self esteem and elimination of self harm thoughts/tendencies. Also, patient struggles with leaving home comfortably and seeks to improve this ability. Target date for these goals is January, 2023. In addition, she needs to improve self care behaviors due to her weight and various medical conditions. Target date for this goal is Feb., 2022. Date revised to December, 2023 to allow time for behavioral implementation. Reports periodic outbursts of rage/anger. Seeks to minimize. Target date is December, 2023. Patient has realized improvement in all areas. Requests additional treatment to facilitate maintenance of progress. Goal date 12-24. Patient's husband died unexpectedly and she is suffering complicated grief. She needs additional counseling to address associated issues and manage feelings of loss, despair and anxiety. Goal date 12-24.  Meds: Vyralar ('6mg'$ ),Nuedexta (40+'20mg'$ /day), Adderall ('20mg'$  4X/day), Xanax ('1mg'$ , 3X/day), and Klonopin ('1mg'$  3X/day).  Patient has requested a video session. She is at home and I am in my home office.  [name] says she is chronically tired. Says she sleeps off and on at night and has to nap during the day. States it feels more like being  sick than depression. Sge does, however, acknowledge that her  motivation is very low and she is not really doing anything during the day. Has not seen her doctor and I encouraged her to make an appointment. She agrees and says she will call today for an appointment.  We talked about the need to structure her day with a plan (eg. Visiting father, etc.). She also needs to make some plans with friends that will motivate her to get up, dressed and out of the house. She agrees and states she will follow-through.                                                                           [name], PhD  Time:2:00p-2:45p 45 minutes.                           n

## 2022-03-13 ENCOUNTER — Ambulatory Visit (INDEPENDENT_AMBULATORY_CARE_PROVIDER_SITE_OTHER): Payer: Medicare PPO | Admitting: Psychology

## 2022-03-13 DIAGNOSIS — F319 Bipolar disorder, unspecified: Secondary | ICD-10-CM | POA: Diagnosis not present

## 2022-03-13 NOTE — Progress Notes (Signed)
[name] is a 52 y.o. female patient   Treatment Plan: Date: 03/13/2022  Diagnosis 296.80 (Unspecified bipolar or related disorder) [n/a]  Symptoms Depressed or irritable mood. (Status: maintained) -- No Description Entered  Diminished interest in or enjoyment of activities. (Status: maintained) -- No Description Entered  Lack of energy. (Status: maintained) -- No Description Entered  Low self-esteem. (Status: maintained) -- No Description Entered  Medication Status compliance  Safety none  If Suicidal or Homicidal State Action Taken: unspecified  Current Risk: low Medications Adderall (Dosage: 55m 4X/day)  Klonapin (Dosage: 161m3X/day)  Lamictal (Dosage: 20058mx/day)  Nuedexta (Dosage: 45m62mVyralar (Dosage: 6mg)41manax (Dosage: 1mg 335may)  Objectives Related Problem: Develop healthy cognitive patterns and beliefs about self and the world that lead to alleviation and help prevent the relapse of mood episodes. Description: State no longer having thoughts of self-harm. Target Date: 2023-01-08 Frequency: Daily Modality: individual Progress: 80%  Related Problem: Develop healthy cognitive patterns and beliefs about self and the world that lead to alleviation and help prevent the relapse of mood episodes. Description: Verbalize any history of past and present suicidal thoughts and actions. Target Date: 2023-01-08 Frequency: Daily Modality: individual Progress: 80%  Related Problem: Develop healthy cognitive patterns and beliefs about self and the world that lead to alleviation and help prevent the relapse of mood episodes. Description: Take prescribed medications as directed. Target Date: 2023-01-08 Frequency: Daily Modality: individual Progress: 90%  Related Problem: Develop healthy cognitive patterns and beliefs about self and the world that lead to alleviation and help prevent the relapse of mood episodes. Description: Discuss and resolve troubling  personal and interpersonal issues. Target Date: 2023-01-08 Frequency: Daily Modality: individual Progress: 80% [id number] complicated grief       Target Date: 12-24       Progress: 20%  Client Response full compliance  [address] [address] Service Code cpt [phone] regulation skills  Related past to present  Rationally challenge thoughts or beliefs/cognitive restructuring  Identify/label emotions  Validate/empathize  Self care activities  Lifestyle change (exercise, nutrition)  Self-monitoring  Facilitate problem solving  Session notes:  f31.9 Bipolar Unspecified.  Goals: Mood stabilization, improved marital relationship, improve self esteem and elimination of self harm thoughts/tendencies. Also, patient struggles with leaving home comfortably and seeks to improve this ability. Target date for these goals is January, 2023. In addition, she needs to improve self care behaviors due to her weight and various medical conditions. Target date for this goal is Feb., 2022. Date revised to December, 2023 to allow time for behavioral implementation. Reports periodic outbursts of rage/anger. Seeks to minimize. Target date is December, 2023. Patient has realized improvement in all areas. Requests additional treatment to facilitate maintenance of progress. Goal date 12-24. Patient's husband died unexpectedly and she is suffering complicated grief. She needs additional counseling to address associated issues and manage feelings of loss, despair and anxiety. Goal date 12-24.  Meds: Vyralar (6mg),N57mexta (40+45mg/da72mAdderall (45mg 4X/52m, Xanax (1mg, 3X/d39m, and Klonopin (1mg 3X/day68m Patient has requested a video session. She is at home and I am in my home office.  Adlean is fLuevinated that she has requested OT and has not received any feedback in almost 2 weeks. She is currently only able to shower once a week and needs help. Her  sister in law Loretta comGuerry Minorsaturdays to help her shower. She will recall her doctor for the request. Her  friend Shirlean Mylar has requested to sell er house and come live with [facility]. Yaelin is mixed about whether it would be a positive or negative experience. We discussed what would need to happen in order for that arrangement to be successful.  Shakiara reports that is is cooking now and eating better than she was before. Still struggles going to the store and has not arranged for delivery. Will continue to encourage and help her work through her reluctance/resistance.                                                                              [name]                                                                                                                                                                                        [name], PhD  Time:2:15p-3:00p 45 minutes.                           n

## 2022-03-14 ENCOUNTER — Other Ambulatory Visit: Payer: Medicare PPO

## 2022-03-16 ENCOUNTER — Other Ambulatory Visit (HOSPITAL_COMMUNITY): Payer: Self-pay | Admitting: Surgery

## 2022-03-16 ENCOUNTER — Ambulatory Visit
Admission: RE | Admit: 2022-03-16 | Discharge: 2022-03-16 | Disposition: A | Discharge status: Home / Self Care | Payer: Medicare PPO | Source: Ambulatory Visit | Attending: Surgery | Admitting: Surgery

## 2022-03-16 DIAGNOSIS — K5720 Diverticulitis of large intestine with perforation and abscess without bleeding: Secondary | ICD-10-CM

## 2022-03-16 DIAGNOSIS — K632 Fistula of intestine: Secondary | ICD-10-CM | POA: Diagnosis not present

## 2022-03-16 DIAGNOSIS — Z4682 Encounter for fitting and adjustment of non-vascular catheter: Secondary | ICD-10-CM | POA: Diagnosis not present

## 2022-03-16 HISTORY — PX: IR RADIOLOGIST EVAL & MGMT: IMG5224

## 2022-03-16 MED ORDER — IOPAMIDOL (ISOVUE-300) INJECTION 61%
10.0000 mL | Freq: Once | INTRAVENOUS | Status: AC | PRN
  Administered 2022-03-16: 10 mL

## 2022-03-16 NOTE — Progress Notes (Signed)
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 female presenting for clinic visit and for drain injection.   She is here by herself, with no new complaints.  She denies fever, new pain.  She has stopped taking ABX.  She reports very little drain into the gravity bag.  She says about 5cc daily.   She was admitted 12/11/21 with hollow organ perforation.  She went to surgery, and the suspicion was colon perforation.  She received surgical drains at the time.  A few days later, post-op CT showed that she had several more collections, and so 4 additional percutaneous drains were placed 12/20/21. Subsequently 3 of these showed resolution with no fistula.  These were removed.  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   She has had a follow up appointment with her surgeon on Dr. Thermon Leyland on 02/16/22. She reports that his preference would be to keep the drain until the fistula closes.  He may offer operation for her diverticular disease towards the end of the year if it closes.  At this time, she does not have another appointment.    Drain injection today shows smaller fistula, but persists. No CT today.   Past Medical History:  Diagnosis Date   Acute systolic congestive heart failure (HCC)    Agoraphobia    Anxiety    panic attacks   Arthritis    osteoarthritis bilal. knees   Asthma    no per PFT 7/13; reports does not have asthma   Bipolar disorder ([place])    Carpal tunnel syndrome of left wrist 05/2011   Being evaluated for MS   Cor pulmonale ([place])    Depression    History of pleural effusion    Hyperlipidemia    Hypertension    Hypoxemia    history of - no home O2   IBS (irritable bowel syndrome)    Morbid obesity (HCC)    Obesity hypoventilation syndrome (HCC)    Pneumonia     Pre-diabetes    Prediabetes    Seasonal allergies    current runny nose   Seizures (HCC)    febrile seizure x 1 as a child. Several times   Sleep apnea sleep study 07/02/2010   uses CPAP nightly   Spinal stenosis in cervical region    Urinary incontinence     Past Surgical History:  Procedure Laterality Date   APPLICATION OF INTRAOPERATIVE CT SCAN N/A 05/14/2019   Procedure: APPLICATION OF INTRAOPERATIVE CT SCAN;  Surgeon: Erline Levine, MD;  Location: [place];  Service: Neurosurgery;  Laterality: N/A;   CARPAL TUNNEL RELEASE  06/27/2011   Procedure: CARPAL TUNNEL RELEASE;  Surgeon: Wynonia Sours, MD;  Location: [place];  Service: Orthopedics;  Laterality: Left;   CARPAL TUNNEL RELEASE  12/19/2011   Procedure: CARPAL TUNNEL RELEASE;  Surgeon: Wynonia Sours, MD;  Location: [place];  Service: Orthopedics;  Laterality: Right;   INCISION AND DRAINAGE ABSCESS Left 08/27/2017   Procedure: INCISION AND DRAINAGE LEFT BUTTOCK  ABSCESS;  Surgeon: Excell Seltzer, MD;  Location: [facility];  Service: General;  Laterality: Left;   IR SINUS/FIST TUBE CHK-NON GI  01/03/2022   IR SINUS/FIST TUBE CHK-NON GI  01/03/2022   IR SINUS/FIST TUBE CHK-NON GI  01/03/2022   IR SINUS/FIST TUBE CHK-NON GI  01/03/2022   IRRIGATION AND DEBRIDEMENT BUTTOCKS Left 08/30/2017   Procedure: IRRIGATION AND DEBRIDEMENT RE-EXCISION OF LEFT SUBCUTANEOUS BUTTOCKS ABCESS;  Surgeon: Kieth Brightly Arta Bruce, MD;  Location: [facility];  Service: General;  Laterality: Left;   LAPAROSCOPY N/A 12/11/2021   Procedure: LAPAROSCOPIC PERITONEAL LAVAGE WITH DRAIN PLACEMENT;  Surgeon: Felicie Morn, MD;  Location: [place];  Service: General;  Laterality: N/A;   MASS EXCISION  08/07/2010   right index   POSTERIOR CERVICAL FUSION/FORAMINOTOMY  09/11/2011   Procedure: POSTERIOR CERVICAL FUSION/FORAMINOTOMY LEVEL 1;  Surgeon: Erline Levine, MD;  Location: [facility];  Service: Neurosurgery;  Laterality: N/A;  Cervical  Three-Four Posteior Cervical Fusion and Decompression.   POSTERIOR CERVICAL FUSION/FORAMINOTOMY N/A 05/14/2019   Procedure: Posterior cervical decompression/fusion Cervical four to Thoracic one with exploration/revision of Cervical threee-four fusion;  Surgeon: Erline Levine, MD;  Location: [place];  Service: Neurosurgery;  Laterality: N/A;   TRIGGER FINGER RELEASE  12/19/2011   Procedure: RELEASE TRIGGER FINGER/A-1 PULLEY;  Surgeon: Wynonia Sours, MD;  Location: [place];  Service: Orthopedics;  Laterality: Right;   TRIGGER FINGER RELEASE Left 12/31/2012   Procedure: RELEASE A-1 PULLEY LEFT THUMB;  Surgeon: Wynonia Sours, MD;  Location: [place];  Service: Orthopedics;  Laterality: Left;   WISDOM TOOTH EXTRACTION      Allergies: Savella [milnacipran hcl]  Medications: Prior to Admission medications   Medication Sig Start Date End Date Taking? Authorizing Provider  albuterol (VENTOLIN HFA) 108 (90 Base) MCG/ACT inhaler Inhale 2 puffs into the lungs every 6 (six) hours as needed for wheezing or shortness of breath. 10/06/20   Rigoberto Noel, MD  ALPRAZolam Duanne Moron) 1 MG tablet Take 1 mg by mouth 3 (three) times daily.    [provider]  amphetamine-dextroamphetamine (ADDERALL) 20 MG tablet Take 20 mg by mouth in the morning, at noon, in the evening, and at bedtime.     [provider]  Ascorbic Acid (VITAMIN C) 500 MG tablet Take 1,000 mg by mouth daily.    [provider]  aspirin 81 MG tablet Take 1 tablet (81 mg total) by mouth daily. 05/21/19   Costella, Vista Mink, PA-C  atorvastatin (LIPITOR) 20 MG tablet TAKE 1 TABLET BY MOUTH EVERY DAY IN THE EVENING Patient taking differently: Take 20 mg by mouth daily. 03/15/21   Minus Breeding, MD  b complex vitamins tablet Take 1 tablet by mouth daily.    [provider]  Blood Glucose Monitoring Suppl (ACCU-CHEK AVIVA PLUS) w/Device KIT As directed. 11/14/21   Martinique, Betty G, MD   Calcium Citrate (CITRACAL PO) Take 2 tablets by mouth daily.    [provider]  Cholecalciferol (VITAMIN D3) 5000 UNITS TABS Take 10,000 Units by mouth in the morning and at bedtime.    [provider]  Chromium Picolinate 500 MCG TABS Take 500 mcg by mouth daily.     [provider]  diclofenac Sodium (VOLTAREN) 1 % GEL Apply 2 g topically 3 (three) times daily as needed (knee pain).    [provider]  furosemide (LASIX) 40 MG tablet Take 2 tablets (80 mg total) by mouth 2 (two) times daily. Patient taking differently: Take 40-80 mg by mouth See admin instructions. Take 80 mg in the morning and 40 mg in the evening 12/05/21   Minus Breeding, MD  [name] 1.5/30 1.5-30 MG-MCG tablet Take 1 tablet by mouth at bedtime. 08/03/19  [provider]  levalbuterol Penne Lash HFA) 45 MCG/ACT inhaler Inhale 2 puffs into the lungs every 6 (six) hours as needed for wheezing. 10/14/20   Rigoberto Noel, MD  lidocaine (LIDODERM) 5 % PLACE 1 PATCH ONTO THE SKIN DAILY AS NEEDED FOR KNEE PAIN. REMOVE AND DISCARD PATCH WITHIN 12 HOURS OR AS DIRECTED BY DOCTOR. Patient taking differently: Place 1 patch onto the skin as needed (knee pain). 05/22/21   Martinique, Betty G, MD  losartan (COZAAR) 25 MG tablet Take 1 tablet (25 mg total) by mouth 2 (two) times daily. 12/05/21   Minus Breeding, MD  LYRICA 300 MG capsule Take 300 mg by mouth 2 (two) times daily.  03/26/11   [provider]  MAGNESIUM PO Take 500 mg by mouth daily.     [provider]  Multiple Vitamin (MULTIVITAMIN) tablet Take 1 tablet by mouth daily.    [provider]  NUEDEXTA 20-10 MG CAPS Take 1 capsule by mouth every 12 (twelve) hours.  09/27/16   [provider]  Omega-3 Fatty Acids (FISH OIL) 1200 MG CAPS Take 2,400 mg by mouth daily.     [provider]  oxyCODONE (OXY IR/ROXICODONE) 5 MG immediate release tablet Take 1 tablet (5 mg total) by mouth every 4 (four) hours as  needed for moderate pain. 01/05/22   Saverio Danker, PA-C  potassium chloride SA (KLOR-CON M20) 20 MEQ tablet Take 1 tablet (20 mEq total) by mouth daily. 04/14/21   Minus Breeding, MD  Sodium Chloride Flush (NORMAL SALINE FLUSH) 0.9 % SOLN Use to flush drain 1-2 times daily. 02/16/22     VRAYLAR 6 MG CAPS Take 6 mg by mouth every evening.  09/03/16   [provider]  Zinc 50 MG CAPS Take 50 mg by mouth in the morning and at bedtime.    [provider]     Family History  Problem Relation Age of Onset   Asthma Mother    Allergic rhinitis Mother    Multiple sclerosis Mother    Asthma Maternal Grandmother    Breast cancer Neg Hx     Social History   Socioeconomic History   Marital status: Married    Spouse name: Not on file   Number of children: Not on file   Years of education: Not on file   Highest education level: Bachelor's degree (e.g., BA, AB, BS)  Occupational History   Occupation: currently unemployed  Tobacco Use   Smoking status: Never   Smokeless tobacco: Never  Substance and Sexual Activity   Alcohol use: Yes    Comment: rare   Drug use: No   Sexual activity: Not on file  Other Topics Concern   Not on file  Social History Narrative   Lives in [place] Strain: Medium Risk (11/13/2021)   Overall Financial Resource Strain (CARDIA)    Difficulty of Paying Living Expenses: Somewhat hard  Food Insecurity: No Food Insecurity (12/13/2021)   Hunger Vital Sign    Worried About Running Out of Food in the Last Year: Never true    Ran Out of Food in the Last Year: Never true  Transportation Needs: No Transportation Needs (12/13/2021)   PRAPARE - Hydrologist (Medical): No    Lack of Transportation (Non-Medical): No  Physical Activity: Insufficiently Active (11/13/2021)   Exercise Vital Sign    Days of Exercise per  Week: 1 day    Minutes of Exercise per Session:  10 min  Stress: Stress Concern Present (11/13/2021)   [place]    Feeling of Stress : Very much  Social Connections: Moderately Isolated (11/13/2021)   Social Connection and Isolation Panel [NHANES]    Frequency of Communication with Friends and Family: More than three times a week    Frequency of Social Gatherings with Friends and Family: More than three times a week    Attends Religious Services: 1 to 4 times per year    Active Member of [employer] or Organizations: No    Attends Archivist Meetings: Never    Marital Status: Widowed       Review of Systems: A 12 point ROS discussed and pertinent positives are indicated in the HPI above.  All other systems are negative.  Review of Systems  Vital Signs: BP (!) 162/88 (BP Location: Left Arm)   Pulse 91   Temp 97.8 F (36.6 C) (Oral)   SpO2 95%     Physical Exam Drain in place LLQ.   Mallampati Score:     Imaging: DG Sinus/Fist Tube Chk-Non GI  Result Date: 02/14/2022 INDICATION: 52 year old with history of perforated diverticulitis and postoperative abscesses. 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 Patient presents for follow-up drain injection. EXAM: DRAIN INJECTION WITH FLUOROSCOPY MEDICATIONS: None ANESTHESIA/SEDATION: None COMPLICATIONS: None immediate. FLUOROSCOPY TIME:  Radiation Exposure Index (as provided by the fluoroscopic device): 10.9 mGy Kerma PROCEDURE: Patient was placed supine. The drain was injected with contrast. Drain was attached to a gravity bag following the injection. FINDINGS: Again noted is a pigtail drain in the left hemipelvis. There is no significant cavity around the pigtail drain. Contrast immediately fills a loop of bowel which most likely represents sigmoid colon. Contrast also fills a loop of bowel more caudal which may also represent sigmoid colon but could represent a second fistula.  IMPRESSION: 1. 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 Fistula appears to be associated with the sigmoid colon. There may actually be 2 separate fistula connections. This could be further characterized with a CT with pre and post drain injections if needed. Electronically Signed   By: Markus Daft M.D.   On: 02/14/2022 17:13    Labs:  CBC: Recent Labs    12/29/21 0824 01/02/22 0530 01/03/22 0301 01/15/22 1438  WBC 8.7 8.9 9.4 7.9  HGB 8.9* 9.4* 9.5* 12.1  HCT 31.3* 30.4* 29.9* 37.7  PLT 331 383 376 541.0*    COAGS: No results for input(s): "INR", "APTT" in the last 8760 hours.  BMP: Recent Labs    12/31/21 0315 01/01/22 0517 01/02/22 0530 01/03/22 0301 01/15/22 1438  NA 139 138 138 140 142  K 4.5 4.3 4.3 4.4 3.8  CL 100 99 100 96* 101  CO2 30 30 31 30 26  $ GLUCOSE 211* 201* 177* 181* 140*  BUN 11 12 13 10 10  $ CALCIUM 8.6* 8.5* 8.5* 9.4 9.9  CREATININE 0.65 0.60 0.60 0.72 0.71  GFRNONAA >60 >60 >60 >60  --     LIVER FUNCTION TESTS: Recent Labs    12/24/21 0412 12/25/21 0411 12/28/21 0948 01/01/22 0517  BILITOT <0.1* 0.4 <0.1* 0.2*  AST 20 19 34 29  ALT 11 12 22 22  $ ALKPHOS 87 74 92 98  PROT 5.4* 5.3* 6.1* 6.0*  ALBUMIN 1.5* <1.5* 1.7* 1.5*    TUMOR MARKERS:  No results for input(s): "AFPTM", "CEA", "CA199", "CHROMGRNA" in the last 8760 hours.  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    The output is decreased to <10 daily, and she has it to gravity.  Not flushing it.   The fistula persists today.  I discussed with her that the only other step to consider would be to do a drain exchange, as the reposition could theoretically given any adhesion to the bowel a chance to heal. She would like to have another injection in 1 month, see if healed, and then consider a drain exchange. I think that is fine.   Plan: - We  will set her up for another drain injection in 1 month.  If fistula closed, removal will be reasonable.  If not, can consider offering drain exchange/reposition  Electronically Signed: Corrie Mckusick 03/16/2022, 3:07 PM   I spent a total of    25 Minutes in face to face in clinical consultation, greater than 50% of which was counseling/coordinating care for LLQ drain with colon fistula

## 2022-03-20 ENCOUNTER — Ambulatory Visit (INDEPENDENT_AMBULATORY_CARE_PROVIDER_SITE_OTHER): Payer: Medicare PPO | Admitting: Psychology

## 2022-03-20 DIAGNOSIS — F319 Bipolar disorder, unspecified: Secondary | ICD-10-CM

## 2022-03-20 NOTE — Progress Notes (Signed)
[name] is a 52 y.o. female patient   Treatment Plan: Date: 03/20/2022  Diagnosis 296.80 (Unspecified bipolar or related disorder) [n/a]  Symptoms Depressed or irritable mood. (Status: maintained) -- No Description Entered  Diminished interest in or enjoyment of activities. (Status: maintained) -- No Description Entered  Lack of energy. (Status: maintained) -- No Description Entered  Low self-esteem. (Status: maintained) -- No Description Entered  Medication Status compliance  Safety none  If Suicidal or Homicidal State Action Taken: unspecified  Current Risk: low Medications Adderall (Dosage: 90m 4X/day)  Klonapin (Dosage: 136m3X/day)  Lamictal (Dosage: 20063mx/day)  Nuedexta (Dosage: 58m72mVyralar (Dosage: 6mg)62manax (Dosage: 1mg 33may)  Objectives Related Problem: Develop healthy cognitive patterns and beliefs about self and the world that lead to alleviation and help prevent the relapse of mood episodes. Description: State no longer having thoughts of self-harm. Target Date: 2023-01-08 Frequency: Daily Modality: individual Progress: 80%  Related Problem: Develop healthy cognitive patterns and beliefs about self and the world that lead to alleviation and help prevent the relapse of mood episodes. Description: Verbalize any history of past and present suicidal thoughts and actions. Target Date: 2023-01-08 Frequency: Daily Modality: individual Progress: 80%  Related Problem: Develop healthy cognitive patterns and beliefs about self and the world that lead to alleviation and help prevent the relapse of mood episodes. Description: Take prescribed medications as directed. Target Date: 2023-01-08 Frequency: Daily Modality: individual Progress: 90%  Related Problem: Develop healthy cognitive patterns and beliefs about self and the world that lead to alleviation and help prevent the relapse of mood episodes. Description:  Discuss and resolve troubling personal and interpersonal issues. Target Date: 2023-01-08 Frequency: Daily Modality: individual Progress: 80% [id number] complicated grief       Target Date: 12-24       Progress: 20%  Client Response full compliance  [place] [address] [id number] Service Code cpt [id number] regulation skills  Related past to present  Rationally challenge thoughts or beliefs/cognitive restructuring  Identify/label emotions  Validate/empathize  Self care activities  Lifestyle change (exercise, nutrition)  Self-monitoring  Facilitate problem solving  Session notes:  f31.9 Bipolar Unspecified.  Goals: Mood stabilization, improved marital relationship, improve self esteem and elimination of self harm thoughts/tendencies. Also, patient struggles with leaving home comfortably and seeks to improve this ability. Target date for these goals is January, 2023. In addition, she needs to improve self care behaviors due to her weight and various medical conditions. Target date for this goal is Feb., 2022. Date revised to December, 2023 to allow time for behavioral implementation. Reports periodic outbursts of rage/anger. Seeks to minimize. Target date is December, 2023. Patient has realized improvement in all areas. Requests additional treatment to facilitate maintenance of progress. Goal date 12-24. Patient's husband died unexpectedly and she is suffering complicated grief. She needs additional counseling to address associated issues and manage feelings of loss, despair and anxiety. Goal date 12-24.  Meds: Vyralar (6mg),N59mexta (40+58mg/da31mAdderall (58mg 4X/52m, Xanax (1mg, 3X/d55m, and Klonopin (1mg 3X/day21m Patient has requested a video session. She is at home and I am in my home office.  [name] was [name] has to "exchange" her drain, which needs to be done in the hospital. It will facilitate healing. Not yet scheduled.  She is feeling better both physically and emotionally. Also is relieved to have  a security system now that she is alone. Reports that she is eating more healthy and friends are shopping for her. She is self conscious about going out due to the drain. Also, other shopping for her keeps her connected. Very pleased with progress.                                                                               [name]                                                                                                                                                                                        Jessica Morel, PhD  Time:2:15p-3:00p 45 minutes.                           n

## 2022-03-22 ENCOUNTER — Ambulatory Visit: Payer: Medicare PPO | Admitting: Adult Health

## 2022-03-22 ENCOUNTER — Encounter: Payer: Self-pay | Admitting: Adult Health

## 2022-03-22 VITALS — BP 132/70 | HR 77 | Temp 98.0°F | Ht 65.0 in | Wt 274.0 lb

## 2022-03-22 DIAGNOSIS — J4520 Mild intermittent asthma, uncomplicated: Secondary | ICD-10-CM | POA: Diagnosis not present

## 2022-03-22 DIAGNOSIS — G4733 Obstructive sleep apnea (adult) (pediatric): Secondary | ICD-10-CM

## 2022-03-22 DIAGNOSIS — J309 Allergic rhinitis, unspecified: Secondary | ICD-10-CM | POA: Diagnosis not present

## 2022-03-22 NOTE — Assessment & Plan Note (Signed)
Healthy weight loss discussed 

## 2022-03-22 NOTE — Progress Notes (Signed)
$@PatientO$  ID: [name], female    DOB: 05-Jan-1971, 52 y.o.   MRN: [id number]  Chief Complaint  Patient presents with   Follow-up    Referring provider: [place], Betty G, MD  HPI: 52 year old female never smoker followed for obstructive sleep apnea, OHS, asthma Medical his significant for diabetes and bipolar disorder  TEST/EVENTS :  04/2010 ABG was 7.40/49/63 on [address] [facility]. [name]    PSG  2002 >> severe OSA  with AHI 40/h, lowest desatn to 52% >> 8 cm H2O - wt 325 lbs   07/2015 CPAP titration study >> required 10-16cm CPAP, higher pressure when supine/ REM sleep.     PFTs 06/2011 - no obstruction, mild restriction, FVC 78%, DLCO 64%   03/22/2022 Follow up ; OSA and Asthma  Patient presents for a follow-up visit.  Patient was last seen in November 2022.  She has underlying sleep.  Is on nocturnal CPAP.  Says that she uses her CPAP every single night cannot sleep without it.  Patient says she feels that she benefits from CPAP with decreased daytime sleepiness.  Patient says she wears her CPAP all night long and also with naps.  CPAP download shows 100% compliance.  Daily average usage at 12.5 hours.  Patient is on auto CPAP 6 to 10 to 16 cm H2O.  AHI 0.8.  Patient has some mild intermittent asthma.  Has a Xopenex inhaler that she uses maybe a couple times a week.  She says overall asthma is been doing well.  She did have COVID few weeks ago.  Says that she has some lingering cough for a while but that seems to be getting better.  She denies any fever, chest pain, orthopnea, edema.  Patient has had a very stressful few months.  Had a prolonged hospitalization and 2023 for perforated colon.  She says she is doing better but she continues to have a drain and is following up with general surgery.  Patient was was doing physical therapy at home.  Also her husband recently passed away unexpectedly from [place].  And her brother passed away as well from dementia.  Support was provided  Allergies   Allergen Reactions   Savella [Milnacipran Hcl] Other (See Comments)    mania    Immunization History  Administered Date(s) Administered   Influenza, High Dose Seasonal PF 01/25/2021   Influenza,inj,Quad PF,6+ Mos 11/14/2021   PFIZER(Purple Top)SARS-COV-2 Vaccination 04/30/2019, 05/28/2019, 11/30/2019   Pneumococcal Polysaccharide-23 11/14/2021   Tdap 03/18/2014   Unspecified SARS-COV-2 Vaccination 05/30/2019, 06/30/2019, 12/30/2019    Past Medical History:  Diagnosis Date   Acute systolic congestive heart failure (HCC)    Agoraphobia    Anxiety    panic attacks   Arthritis    osteoarthritis bilal. knees   Asthma    no per PFT 7/13; reports does not have asthma   Bipolar disorder ([place])    Carpal tunnel syndrome of left wrist 05/2011   Being evaluated for MS   Cor pulmonale ([place])    Depression    History of pleural effusion    Hyperlipidemia    Hypertension    Hypoxemia    history of - no home O2   IBS (irritable bowel syndrome)    Morbid obesity (HCC)    Obesity hypoventilation syndrome (HCC)    Pneumonia    Pre-diabetes    Prediabetes    Seasonal allergies    current runny nose   Seizures (HCC)    febrile seizure x  1 as a child. Several times   Sleep apnea sleep study 07/02/2010   uses CPAP nightly   Spinal stenosis in cervical region    Urinary incontinence     Tobacco History: Social History   Tobacco Use  Smoking Status Never  Smokeless Tobacco Never   Counseling given: Not Answered   Outpatient Medications Prior to Visit  Medication Sig Dispense Refill   albuterol (VENTOLIN HFA) 108 (90 Base) MCG/ACT inhaler Inhale 2 puffs into the lungs every 6 (six) hours as needed for wheezing or shortness of breath. 32 g 0   ALPRAZolam (XANAX) 1 MG tablet Take 1 mg by mouth 3 (three) times daily.     amphetamine-dextroamphetamine (ADDERALL) 20 MG tablet Take 20 mg by mouth in the morning, at noon, in the evening, and at bedtime.      Ascorbic Acid (VITAMIN C)  500 MG tablet Take 1,000 mg by mouth daily.     aspirin 81 MG tablet Take 1 tablet (81 mg total) by mouth daily. 30 tablet    atorvastatin (LIPITOR) 20 MG tablet TAKE 1 TABLET BY MOUTH EVERY DAY IN THE EVENING (Patient taking differently: Take 20 mg by mouth daily.) 90 tablet 4   b complex vitamins tablet Take 1 tablet by mouth daily.     Blood Glucose Monitoring Suppl (ACCU-CHEK AVIVA PLUS) w/Device KIT As directed. 1 kit 0   Calcium Citrate (CITRACAL PO) Take 2 tablets by mouth daily.     Cholecalciferol (VITAMIN D3) 5000 UNITS TABS Take 10,000 Units by mouth in the morning and at bedtime.     Chromium Picolinate 500 MCG TABS Take 500 mcg by mouth daily.      diclofenac Sodium (VOLTAREN) 1 % GEL Apply 2 g topically 3 (three) times daily as needed (knee pain).     furosemide (LASIX) 40 MG tablet Take 2 tablets (80 mg total) by mouth 2 (two) times daily. (Patient taking differently: Take 40-80 mg by mouth See admin instructions. Take 80 mg in the morning and 40 mg in the evening) 360 tablet 3   [name] 1.5/30 1.5-30 MG-MCG tablet Take 1 tablet by mouth at bedtime.     levalbuterol (XOPENEX HFA) 45 MCG/ACT inhaler Inhale 2 puffs into the lungs every 6 (six) hours as needed for wheezing. 45 g 3   lidocaine (LIDODERM) 5 % PLACE 1 PATCH ONTO THE SKIN DAILY AS NEEDED FOR KNEE PAIN. REMOVE AND DISCARD PATCH WITHIN 12 HOURS OR AS DIRECTED BY DOCTOR. (Patient taking differently: Place 1 patch onto the skin as needed (knee pain).) 30 patch 0   losartan (COZAAR) 25 MG tablet Take 1 tablet (25 mg total) by mouth 2 (two) times daily. 180 tablet 2   LYRICA 300 MG capsule Take 300 mg by mouth 2 (two) times daily.      MAGNESIUM PO Take 500 mg by mouth daily.      Multiple Vitamin (MULTIVITAMIN) tablet Take 1 tablet by mouth daily.     NUEDEXTA 20-10 MG CAPS Take 1 capsule by mouth every 12 (twelve) hours.   12   Omega-3 Fatty Acids (FISH OIL) 1200 MG CAPS Take 2,400 mg by mouth daily.      potassium chloride SA  (KLOR-CON M20) 20 MEQ tablet Take 1 tablet (20 mEq total) by mouth daily. 90 tablet 4   Sodium Chloride Flush (NORMAL SALINE FLUSH) 0.9 % SOLN Use to flush drain 1-2 times daily. 600 mL 1   VRAYLAR 6 MG CAPS Take 6 mg  by mouth every evening.      Zinc 50 MG CAPS Take 50 mg by mouth in the morning and at bedtime.     oxyCODONE (OXY IR/ROXICODONE) 5 MG immediate release tablet Take 1 tablet (5 mg total) by mouth every 4 (four) hours as needed for moderate pain. (Patient not taking: Reported on 03/22/2022) 10 tablet 0   No facility-administered medications prior to visit.     Review of Systems:   Constitutional:   No  weight loss, night sweats,  Fevers, chills,  +fatigue, or  lassitude.  HEENT:   No headaches,  Difficulty swallowing,  Tooth/dental problems, or  Sore throat,                No sneezing, itching, ear ache, nasal congestion, post nasal drip,   CV:  No chest pain,  Orthopnea, PND, swelling in lower extremities, anasarca, dizziness, palpitations, syncope.   GI  No heartburn, indigestion, abdominal pain, nausea, vomiting, diarrhea, change in bowel habits, loss of appetite, bloody stools.   Resp: No shortness of breath with exertion or at rest.  No excess mucus, no productive cough,  No non-productive cough,  No coughing up of blood.  No change in color of mucus.  No wheezing.  No chest wall deformity  Skin: no rash or lesions.  GU: no dysuria, change in color of urine, no urgency or frequency.  No flank pain, no hematuria   MS:  No joint pain or swelling.  No decreased range of motion.  No back pain.    Physical Exam  BP 132/70 (BP Location: Right Arm, Patient Position: Sitting, Cuff Size: Large)   Pulse 77   Temp 98 F (36.7 C) (Oral)   Ht 5' 5"$  (1.651 m)   Wt 274 lb (124.3 kg)   SpO2 99%   BMI 45.60 kg/m   GEN: A/Ox3; pleasant , NAD, in WC    HEENT:  /AT,  NOSE-clear, THROAT-clear, no lesions, no postnasal drip or exudate noted.  Class III MP airway  NECK:   Supple w/ fair ROM; no JVD; normal carotid impulses w/o bruits; no thyromegaly or nodules palpated; no lymphadenopathy.    RESP  Clear  P & A; w/o, wheezes/ rales/ or rhonchi. no accessory muscle use, no dullness to percussion  CARD:  RRR, no m/r/g, no peripheral edema, pulses intact, no cyanosis or clubbing.  GI:   Soft & nt; nml bowel sounds; no organomegaly or masses detected.   Musco: Warm bil, no deformities or joint swelling noted.   Neuro: alert, no focal deficits noted.    Skin: Warm, no lesions or rashes       Imaging:         No data to display          No results found for: "NITRICOXIDE"      Assessment & Plan:   OSA (obstructive sleep apnea) Excellent control and compliance on CPAP.  Continue on current settings  Plan  Patient Instructions  Continue on CPAP At bedtime   Keep up good work .  Work on healthy weight loss.  Do not drive if sleepy .  Xopenex inhaler As needed   Follow up with Dr. Elsworth Soho  In 1 year and As needed  .     Asthma Mild intermittent asthma.  Xopenex as needed.  Asthma action plan discussed.  Plan  Patient Instructions  Continue on CPAP At bedtime   Keep up good work .  Work on healthy  weight loss.  Do not drive if sleepy .  Xopenex inhaler As needed   Follow up with Dr. Elsworth Soho  In 1 year and As needed  .     Allergic rhinitis May use Zyrtec as needed.  Morbid obesity ([place]) Healthy weight loss discussed     Rexene Edison, NP 03/22/2022

## 2022-03-22 NOTE — Patient Instructions (Addendum)
Continue on CPAP At bedtime   Keep up good work .  Work on healthy weight loss.  Do not drive if sleepy .  Xopenex inhaler As needed   Follow up with Dr. Elsworth Soho  In 1 year and As needed  .

## 2022-03-22 NOTE — Assessment & Plan Note (Signed)
May use Zyrtec as needed.

## 2022-03-22 NOTE — Assessment & Plan Note (Signed)
Mild intermittent asthma.  Xopenex as needed.  Asthma action plan discussed.  Plan  Patient Instructions  Continue on CPAP At bedtime   Keep up good work .  Work on healthy weight loss.  Do not drive if sleepy .  Xopenex inhaler As needed   Follow up with Dr. Elsworth Soho  In 1 year and As needed  .

## 2022-03-22 NOTE — Assessment & Plan Note (Signed)
Excellent control and compliance on CPAP.  Continue on current settings  Plan  Patient Instructions  Continue on CPAP At bedtime   Keep up good work .  Work on healthy weight loss.  Do not drive if sleepy .  Xopenex inhaler As needed   Follow up with Dr. Elsworth Soho  In 1 year and As needed  .

## 2022-03-27 ENCOUNTER — Ambulatory Visit (INDEPENDENT_AMBULATORY_CARE_PROVIDER_SITE_OTHER): Payer: Medicare PPO | Admitting: Psychology

## 2022-03-27 DIAGNOSIS — F319 Bipolar disorder, unspecified: Secondary | ICD-10-CM | POA: Diagnosis not present

## 2022-03-27 NOTE — Progress Notes (Signed)
[name] is a 52 y.o. female patient   Treatment Plan: Date: 03/27/2022  Diagnosis 296.80 (Unspecified bipolar or related disorder) [n/a]  Symptoms Depressed or irritable mood. (Status: maintained) -- No Description Entered  Diminished interest in or enjoyment of activities. (Status: maintained) -- No Description Entered  Lack of energy. (Status: maintained) -- No Description Entered  Low self-esteem. (Status: maintained) -- No Description Entered  Medication Status compliance  Safety none  If Suicidal or Homicidal State Action Taken: unspecified  Current Risk: low Medications Adderall (Dosage: '20mg'$  4X/day)  Klonapin (Dosage: '1mg'$  3X/day)  Lamictal (Dosage: '200mg'$  2x/day)  Nuedexta (Dosage: '20mg'$ )  Vyralar (Dosage: '6mg'$ )  Xanax (Dosage: '1mg'$  3X/day)  Objectives Related Problem: Develop healthy cognitive patterns and beliefs about self and the world that lead to alleviation and help prevent the relapse of mood episodes. Description: State no longer having thoughts of self-harm. Target Date: 2023-01-08 Frequency: Daily Modality: individual Progress: 80%  Related Problem: Develop healthy cognitive patterns and beliefs about self and the world that lead to alleviation and help prevent the relapse of mood episodes. Description: Verbalize any history of past and present suicidal thoughts and actions. Target Date: 2023-01-08 Frequency: Daily Modality: individual Progress: 80%  Related Problem: Develop healthy cognitive patterns and beliefs about self and the world that lead to alleviation and help prevent the relapse of mood episodes. Description: Take prescribed medications as directed. Target Date: 2023-01-08 Frequency: Daily Modality: individual Progress: 90%  Related Problem: Develop healthy cognitive patterns and beliefs about self and the world that lead to alleviation and help prevent the relapse of  mood episodes. Description: Discuss and resolve troubling personal and interpersonal issues. Target Date: 2023-01-08 Frequency: Daily Modality: individual Progress: [id number] Resolve complicated grief       Target Date: 12-24       Progress: 20%  Client Response full compliance  Service Location Location, [name] [place], [place] [address] -virtual Service Code cpt [phone]  Emotion regulation skills  Related past to present  Rationally challenge thoughts or beliefs/cognitive restructuring  Identify/label emotions  Validate/empathize  Self care activities  Lifestyle change (exercise, nutrition)  Self-monitoring  Facilitate problem solving  Session notes:  f31.9 Bipolar Unspecified.  Goals: Mood stabilization, improved marital relationship, improve self esteem and elimination of self harm thoughts/tendencies. Also, patient struggles with leaving home comfortably and seeks to improve this ability. Target date for these goals is January, 2023. In addition, she needs to improve self care behaviors due to her weight and various medical conditions. Target date for this goal is Feb., 2022. Date revised to December, 2023 to allow time for behavioral implementation. Reports periodic outbursts of rage/anger. Seeks to minimize. Target date is December, 2023. Patient has realized improvement in all areas. Requests additional treatment to facilitate maintenance of progress. Goal date 12-24. Patient's husband died unexpectedly and she is suffering complicated grief. She needs additional counseling to address associated issues and manage feelings of loss, despair and anxiety. Goal date 12-24.  Meds: Vyralar ('6mg'$ ),Nuedexta (40+'20mg'$ /day), Adderall ('20mg'$  4X/day), Xanax ('1mg'$ , 3X/day), and Klonopin ('1mg'$  3X/day).  Patient has requested a video session. She is at home and I am in my home office.  [name] says that she is having physical therapy 2x/week. Is walking to mailbox each day as her exercise. She  has had friends  come visit who helped clean her house. She is doing some minimal driving to do errands. Her sleep cycle remains inconsistent. She often doesn't fall asleep until 3 or 4 am even though she goes to bed at around 11. She says she thinks about being alone and that she has so little in her life. She claims she is not suicidal and is, in fact, scared of death. WE talked about her unwillingness to go to the grocery. She says that [name] would always go to the grocery by himself. Going alone represents the change in her life that she would like to deny. She says she was just put on Medicaid but is unsure why. Will not call them to get more information.                                                                                  [name]                                                                                                                                                                                        [name], PhD  Time:2:15p-3:00p 45 minutes.                           n

## 2022-04-02 ENCOUNTER — Ambulatory Visit (INDEPENDENT_AMBULATORY_CARE_PROVIDER_SITE_OTHER): Payer: Medicare PPO

## 2022-04-02 VITALS — Ht 65.0 in | Wt 269.0 lb

## 2022-04-02 DIAGNOSIS — Z0000 Encounter for general adult medical examination without abnormal findings: Secondary | ICD-10-CM

## 2022-04-02 NOTE — Progress Notes (Signed)
Subjective:   [name] is a 52 y.o. female who presents for Medicare Annual (Subsequent) preventive examination.  Review of Systems    Virtual Visit via Telephone Note  I connected with  [name] on 04/02/22 at  2:15 PM EST by telephone and verified that I am speaking with the correct person using two identifiers.  Location: Patient: Home Provider: Office Persons participating in the virtual visit: patient/Nurse Health Advisor   I discussed the limitations, risks, security and privacy concerns of performing an evaluation and management service by telephone and the availability of in person appointments. The patient expressed understanding and agreed to proceed.  Interactive audio and video telecommunications were attempted between this nurse and patient, however failed, due to patient having technical difficulties OR patient did not have access to video capability.  We continued and completed visit with audio only.  Some vital signs may be absent or patient reported.   Criselda Peaches, LPN  Cardiac Risk Factors include: advanced age (>62mn, >>59women);hypertension;obesity (BMI >30kg/m2)     Objective:    Today's Vitals   04/02/22 1423  Weight: 269 lb (122 kg)  Height: '5\' 5"'$  (1.651 m)   Body mass index is 44.76 kg/m.     04/02/2022    2:38 PM 12/11/2021    6:57 PM 03/30/2021    2:29 PM 03/28/2020    2:13 PM 05/17/2019    7:33 AM 05/11/2019    1:49 PM 08/27/2017    5:35 PM  Advanced Directives  Does Patient Have a Medical Advance Directive? Yes No Yes Yes No No   Type of AParamedicof ACraneLiving will  HCenter PointLiving will H[place]Living will     Does patient want to make changes to medical advance directive?   No - Patient declined No - Patient declined     Copy of [place] Chart? No - copy requested  No - copy requested No - copy requested     Would patient  like information on creating a medical advance directive?  No - Patient declined    Yes (MAU/Ambulatory/Procedural Areas - Information given)      Information is confidential and restricted. Go to Review Flowsheets to unlock data.    Current Medications (verified) Outpatient Encounter Medications as of 04/02/2022  Medication Sig   albuterol (VENTOLIN HFA) 108 (90 Base) MCG/ACT inhaler Inhale 2 puffs into the lungs every 6 (six) hours as needed for wheezing or shortness of breath.   ALPRAZolam (XANAX) 1 MG tablet Take 1 mg by mouth 3 (three) times daily.   amphetamine-dextroamphetamine (ADDERALL) 20 MG tablet Take 20 mg by mouth in the morning, at noon, in the evening, and at bedtime.    Ascorbic Acid (VITAMIN C) 500 MG tablet Take 1,000 mg by mouth daily.   aspirin 81 MG tablet Take 1 tablet (81 mg total) by mouth daily.   atorvastatin (LIPITOR) 20 MG tablet TAKE 1 TABLET BY MOUTH EVERY DAY IN THE EVENING (Patient taking differently: Take 20 mg by mouth daily.)   b complex vitamins tablet Take 1 tablet by mouth daily.   Blood Glucose Monitoring Suppl (ACCU-CHEK AVIVA PLUS) w/Device KIT As directed.   Calcium Citrate (CITRACAL PO) Take 2 tablets by mouth daily.   Cholecalciferol (VITAMIN D3) 5000 UNITS TABS Take 10,000 Units by mouth in the morning and at bedtime.   Chromium Picolinate 500 MCG TABS Take 500 mcg by mouth  daily.    diclofenac Sodium (VOLTAREN) 1 % GEL Apply 2 g topically 3 (three) times daily as needed (knee pain).   furosemide (LASIX) 40 MG tablet Take 2 tablets (80 mg total) by mouth 2 (two) times daily. (Patient taking differently: Take 40-80 mg by mouth See admin instructions. Take 80 mg in the morning and 40 mg in the evening)   [name] 1.5/30 1.5-30 MG-MCG tablet Take 1 tablet by mouth at bedtime.   levalbuterol (XOPENEX HFA) 45 MCG/ACT inhaler Inhale 2 puffs into the lungs every 6 (six) hours as needed for wheezing.   lidocaine (LIDODERM) 5 % PLACE 1 PATCH ONTO THE SKIN DAILY  AS NEEDED FOR KNEE PAIN. REMOVE AND DISCARD PATCH WITHIN 12 HOURS OR AS DIRECTED BY DOCTOR. (Patient taking differently: Place 1 patch onto the skin as needed (knee pain).)   losartan (COZAAR) 25 MG tablet Take 1 tablet (25 mg total) by mouth 2 (two) times daily.   LYRICA 300 MG capsule Take 300 mg by mouth 2 (two) times daily.    MAGNESIUM PO Take 500 mg by mouth daily.    Multiple Vitamin (MULTIVITAMIN) tablet Take 1 tablet by mouth daily.   NUEDEXTA 20-10 MG CAPS Take 1 capsule by mouth every 12 (twelve) hours.    Omega-3 Fatty Acids (FISH OIL) 1200 MG CAPS Take 2,400 mg by mouth daily.    potassium chloride SA (KLOR-CON M20) 20 MEQ tablet Take 1 tablet (20 mEq total) by mouth daily.   Sodium Chloride Flush (NORMAL SALINE FLUSH) 0.9 % SOLN Use to flush drain 1-2 times daily.   VRAYLAR 6 MG CAPS Take 6 mg by mouth every evening.    Zinc 50 MG CAPS Take 50 mg by mouth in the morning and at bedtime.   No facility-administered encounter medications on file as of 04/02/2022.    Allergies (verified) Savella [milnacipran hcl]   History: Past Medical History:  Diagnosis Date   Acute systolic congestive heart failure (HCC)    Agoraphobia    Anxiety    panic attacks   Arthritis    osteoarthritis bilal. knees   Asthma    no per PFT 7/13; reports does not have asthma   Bipolar disorder ([place])    Carpal tunnel syndrome of left wrist 05/2011   Being evaluated for MS   Cor pulmonale ([place])    Depression    History of pleural effusion    Hyperlipidemia    Hypertension    Hypoxemia    history of - no home O2   IBS (irritable bowel syndrome)    Morbid obesity (HCC)    Obesity hypoventilation syndrome (HCC)    Pneumonia    Pre-diabetes    Prediabetes    Seasonal allergies    current runny nose   Seizures (HCC)    febrile seizure x 1 as a child. Several times   Sleep apnea sleep study 07/02/2010   uses CPAP nightly   Spinal stenosis in cervical region    Urinary incontinence    Past  Surgical History:  Procedure Laterality Date   APPLICATION OF INTRAOPERATIVE CT SCAN N/A 05/14/2019   Procedure: APPLICATION OF INTRAOPERATIVE CT SCAN;  Surgeon: Erline Levine, MD;  Location: [facility];  Service: Neurosurgery;  Laterality: N/A;   CARPAL TUNNEL RELEASE  06/27/2011   Procedure: CARPAL TUNNEL RELEASE;  Surgeon: Wynonia Sours, MD;  Location: [facility];  Service: Orthopedics;  Laterality: Left;   CARPAL TUNNEL RELEASE  12/19/2011   Procedure:  CARPAL TUNNEL RELEASE;  Surgeon: Wynonia Sours, MD;  Location: [place];  Service: Orthopedics;  Laterality: Right;   INCISION AND DRAINAGE ABSCESS Left 08/27/2017   Procedure: INCISION AND DRAINAGE LEFT BUTTOCK  ABSCESS;  Surgeon: Excell Seltzer, MD;  Location: [facility];  Service: General;  Laterality: Left;   IR RADIOLOGIST EVAL & MGMT  03/16/2022   IR SINUS/FIST TUBE CHK-NON GI  01/03/2022   IR SINUS/FIST TUBE CHK-NON GI  01/03/2022   IR SINUS/FIST TUBE CHK-NON GI  01/03/2022   IR SINUS/FIST TUBE CHK-NON GI  01/03/2022   IRRIGATION AND DEBRIDEMENT BUTTOCKS Left 08/30/2017   Procedure: IRRIGATION AND DEBRIDEMENT RE-EXCISION OF LEFT SUBCUTANEOUS BUTTOCKS ABCESS;  Surgeon: Mickeal Skinner, MD;  Location: [facility];  Service: General;  Laterality: Left;   LAPAROSCOPY N/A 12/11/2021   Procedure: LAPAROSCOPIC PERITONEAL LAVAGE WITH DRAIN PLACEMENT;  Surgeon: Felicie Morn, MD;  Location: [place];  Service: General;  Laterality: N/A;   MASS EXCISION  08/07/2010   right index   POSTERIOR CERVICAL FUSION/FORAMINOTOMY  09/11/2011   Procedure: POSTERIOR CERVICAL FUSION/FORAMINOTOMY LEVEL 1;  Surgeon: Erline Levine, MD;  Location: [facility];  Service: Neurosurgery;  Laterality: N/A;  Cervical Three-Four Posteior Cervical Fusion and Decompression.   POSTERIOR CERVICAL FUSION/FORAMINOTOMY N/A 05/14/2019   Procedure: Posterior cervical decompression/fusion Cervical four to Thoracic one with exploration/revision of Cervical  threee-four fusion;  Surgeon: Erline Levine, MD;  Location: [facility];  Service: Neurosurgery;  Laterality: N/A;   TRIGGER FINGER RELEASE  12/19/2011   Procedure: RELEASE TRIGGER FINGER/A-1 PULLEY;  Surgeon: Wynonia Sours, MD;  Location: [place];  Service: Orthopedics;  Laterality: Right;   TRIGGER FINGER RELEASE Left 12/31/2012   Procedure: RELEASE A-1 PULLEY LEFT THUMB;  Surgeon: Wynonia Sours, MD;  Location: [place];  Service: Orthopedics;  Laterality: Left;   WISDOM TOOTH EXTRACTION     Family History  Problem Relation Age of Onset   Asthma Mother    Allergic rhinitis Mother    Multiple sclerosis Mother    Asthma Maternal Grandmother    Breast cancer Neg Hx    Social History   Socioeconomic History   Marital status: Widowed    Spouse name: Not on file   Number of children: Not on file   Years of education: Not on file   Highest education level: Bachelor's degree (e.g., BA, AB, BS)  Occupational History   Occupation: currently unemployed  Tobacco Use   Smoking status: Never   Smokeless tobacco: Never  Substance and Sexual Activity   Alcohol use: Yes    Comment: rare   Drug use: No   Sexual activity: Not on file  Other Topics Concern   Not on file  Social History Narrative   Lives in [place] Strain: Low Risk  (04/02/2022)   Overall Financial Resource Strain (CARDIA)    Difficulty of Paying Living Expenses: Not hard at all  Food Insecurity: No Food Insecurity (04/02/2022)   Hunger Vital Sign    Worried About Running Out of Food in the Last Year: Never true    [place] in the Last Year: Never true  Transportation Needs: No Transportation Needs (04/02/2022)   PRAPARE - Hydrologist (Medical): No    Lack of Transportation (Non-Medical): No  Physical Activity: Insufficiently Active (04/02/2022)   Exercise Vital Sign  Days of Exercise per Week: 2  days    Minutes of Exercise per Session: 30 min  Stress: No Stress Concern Present (04/02/2022)   [place]    Feeling of Stress : Not at all  Social Connections: Moderately Integrated (04/02/2022)   Social Connection and Isolation Panel [NHANES]    Frequency of Communication with Friends and Family: More than three times a week    Frequency of Social Gatherings with Friends and Family: More than three times a week    Attends Religious Services: More than 4 times per year    Active Member of [employer] or Organizations: Yes    Attends Archivist Meetings: More than 4 times per year    Marital Status: Widowed    Tobacco Counseling Counseling given: Not Answered   Clinical Intake:  Pre-visit preparation completed: Yes  Pain : No/denies pain     BMI - recorded: 44.76 Nutritional Status: BMI > 30  Obese Nutritional Risks: None Diabetes: No  How often do you need to have someone help you when you read instructions, pamphlets, or other written materials from your doctor or pharmacy?: 1 - Never  Diabetic?  No  Interpreter Needed?: No  Information entered by :: Rolene Arbour LPN   Activities of Daily Living    04/02/2022    2:33 PM 03/27/2022    3:08 PM  In your present state of health, do you have any difficulty performing the following activities:  Hearing? 0 0  Vision? 0 0  Difficulty concentrating or making decisions? 0 0  Walking or climbing stairs? 1 1  Comment Followed by Medical attention   Dressing or bathing? 1 1  Comment Aide  assist   Doing errands, shopping? 1 1  Comment Family and aide Diplomatic Services operational officer and eating ? N N  Using the Toilet? N N  In the past six months, have you accidently leaked urine? N N  Do you have problems with loss of bowel control? N N  Managing your Medications? N N  Managing your Finances? Tempie Donning  Comment Family Assist   Housekeeping or managing your  Housekeeping? Tempie Donning  Comment Friends assist     Patient Care Team: [place], Betty G, MD as PCP - General (Family Medicine) Minus Breeding, MD as PCP - Cardiology (Cardiology)  Indicate any recent Medical Services you may have received from other than [facility] providers in the past year (date may be approximate).     Assessment:   This is a routine wellness examination for [facility].  Hearing/Vision screen Hearing Screening - Comments:: Denies hearing difficulties   Vision Screening - Comments:: Wears rx glasses - up to date with routine eye exams with Dr Joya San   Dietary issues and exercise activities discussed: Exercise limited by: orthopedic condition(s)   Goals Addressed               This Visit's Progress     Patient Stated (pt-stated)        Continue to learn how to play the drums.       Depression Screen    04/02/2022    2:32 PM 01/15/2022    1:51 PM 11/14/2021    4:08 PM 03/30/2021    2:13 PM 03/28/2020    2:15 PM  PHQ 2/9 Scores  PHQ - 2 Score 0 0 '2 4 3  '$ PHQ- 9 Score  '2 9 6 12    '$ Fall  Risk    03/27/2022    3:08 PM 01/15/2022    1:51 PM 11/14/2021    4:07 PM 03/30/2021    2:29 PM 03/28/2020    2:14 PM  Fall Risk   Falls in the past year? 1 0 0 0 0  Number falls in past yr: 0 0 0 0 0  Injury with Fall? 0 0 0 0 0  Risk for fall due to : No Fall Risks Other (Comment);History of fall(s) No Fall Risks No Fall Risks No Fall Risks  Follow up Falls prevention discussed Falls evaluation completed Falls evaluation completed  Falls evaluation completed;Falls prevention discussed    FALL RISK PREVENTION PERTAINING TO THE HOME:  Any stairs in or around the home? Yes  If so, are there any without handrails? No  Home free of loose throw rugs in walkways, pet beds, electrical cords, etc? Yes  Adequate lighting in your home to reduce risk of falls? Yes   ASSISTIVE DEVICES UTILIZED TO PREVENT FALLS:  Life alert? No  Use of a cane, walker or w/c? Yes  Grab bars in the  bathroom? Yes  Shower chair or bench in shower? Yes  Elevated toilet seat or a handicapped toilet? Yes   TIMED UP AND GO:  Was the test performed? No . Audio Visit  Cognitive Function:        04/02/2022    2:38 PM 03/30/2021    2:30 PM  6CIT Screen  What Year? 0 points 0 points  What month? 0 points 0 points  What time? 0 points 0 points  Count back from 20 0 points 0 points  Months in reverse 0 points 0 points  Repeat phrase 0 points 0 points  Total Score 0 points 0 points    Immunizations Immunization History  Administered Date(s) Administered   Influenza, High Dose Seasonal PF 01/25/2021   Influenza,inj,Quad PF,6+ Mos 11/14/2021   PFIZER(Purple Top)SARS-COV-2 Vaccination 04/30/2019, 05/28/2019, 11/30/2019   Pneumococcal Polysaccharide-23 11/14/2021   Tdap 03/18/2014   Unspecified SARS-COV-2 Vaccination 05/30/2019, 06/30/2019, 12/30/2019    TDAP status: Up to date  Flu Vaccine status: Up to date    Covid-19 vaccine status: Completed vaccines  Qualifies for Shingles Vaccine? Yes   Zostavax completed No   Shingrix Completed?: No.    Education has been provided regarding the importance of this vaccine. Patient has been advised to call insurance company to determine out of pocket expense if they have not yet received this vaccine. Advised may also receive vaccine at local pharmacy or Health Dept. Verbalized acceptance and understanding.  Screening Tests Health Maintenance  Topic Date Due   Diabetic kidney evaluation - Urine ACR  Never done   PAP SMEAR-Modifier  10/26/2018   OPHTHALMOLOGY EXAM  04/02/2022 (Originally 01/10/2021)   COVID-19 Vaccine (7 - 2023-24 season) 04/18/2022 (Originally 09/29/2021)   Zoster Vaccines- Shingrix (1 of 2) 07/03/2022 (Originally [birth year]19[birth year]1988)   MAMMOGRAM  04/02/2023 (Originally 11/20/2019)   COLONOSCOPY (Pts 45-69yr Insurance coverage will need to be confirmed)  04/02/2023 (Originally 11/20/2014)   HEMOGLOBIN A1C  05/16/2022   FOOT  EXAM  11/15/2022   Diabetic kidney evaluation - eGFR measurement  01/16/2023   Medicare Annual Wellness (AWV)  04/02/2023   DTaP/Tdap/Td (2 - Td or Tdap) 03/18/2024   INFLUENZA VACCINE  Completed   Hepatitis C Screening  Completed   HIV Screening  Completed   HPV VACCINES  Aged Out    Health Maintenance  Health Maintenance Due  Topic Date Due  Diabetic kidney evaluation - Urine ACR  Never done   PAP SMEAR-Modifier  10/26/2018    Colorectal cancer screening: Referral to GI placed Patient deferred. Pt aware the office will call re: appt.  Mammogram status: Ordered Patient deferrd. Pt provided with contact info and advised to call to schedule appt.     Lung Cancer Screening: (Low Dose CT Chest recommended if Age 64-80 years, 30 pack-year currently smoking OR have quit w/in 15years.) does not qualify.     Additional Screening:  Hepatitis C Screening: does qualify; Completed 05/20/20  Vision Screening: Recommended annual ophthalmology exams for early detection of glaucoma and other disorders of the eye. Is the patient up to date with their annual eye exam?  Yes  Who is the provider or what is the name of the office in which the patient attends annual eye exams? Dr Joya San If pt is not established with a provider, would they like to be referred to a provider to establish care? No .   Dental Screening: Recommended annual dental exams for proper oral hygiene  Community Resource Referral / Chronic Care Management:  CRR required this visit?  No   CCM required this visit?  No      Plan:     I have personally reviewed and noted the following in the patient's chart:   Medical and social history Use of alcohol, tobacco or illicit drugs  Current medications and supplements including opioid prescriptions. Patient is not currently taking opioid prescriptions. Functional ability and status Nutritional status Physical activity Advanced directives List of other  physicians Hospitalizations, surgeries, and ER visits in previous 12 months Vitals Screenings to include cognitive, depression, and falls Referrals and appointments  In addition, I have reviewed and discussed with patient certain preventive protocols, quality metrics, and best practice recommendations. A written personalized care plan for preventive services as well as general preventive health recommendations were provided to patient.     Criselda Peaches, LPN   [id number]   Nurse Notes: None

## 2022-04-02 NOTE — Patient Instructions (Addendum)
Ms. [name] , Thank you for taking time to come for your Medicare Wellness Visit. I appreciate your ongoing commitment to your health goals. Please review the following plan we discussed and let me know if I can assist you in the future.   These are the goals we discussed:  Goals       Patient Stated (pt-stated)      Continue to learn how to play the drums.      Weight (lb) < 200 lb (90.7 kg)        This is a list of the screening recommended for you and due dates:  Health Maintenance  Topic Date Due   Yearly kidney health urinalysis for diabetes  Never done   Pap Smear  10/26/2018   Eye exam for diabetics  04/02/2022*   COVID-19 Vaccine (7 - 2023-24 season) 04/18/2022*   Zoster (Shingles) Vaccine (1 of 2) 07/03/2022*   Mammogram  04/02/2023*   Colon Cancer Screening  04/02/2023*   Hemoglobin A1C  05/16/2022   Complete foot exam   11/15/2022   Yearly kidney function blood test for diabetes  01/16/2023   Medicare Annual Wellness Visit  04/02/2023   DTaP/Tdap/Td vaccine (2 - Td or Tdap) 03/18/2024   Flu Shot  Completed   Hepatitis C Screening: USPSTF Recommendation to screen - Ages 18-79 yo.  Completed   HIV Screening  Completed   HPV Vaccine  Aged Out  *Topic was postponed. The date shown is not the original due date.    Advanced directives: Advance directive discussed with you today. Even though you declined this today, please call our office should you change your mind, and we can give you the proper paperwork for you to fill out.   Conditions/risks identified: None  Next appointment: Follow up in one year for your annual wellness visit.    Preventive Care 40-64 Years, Female Preventive care refers to lifestyle choices and visits with your health care provider that can promote health and wellness. What does preventive care include? A yearly physical exam. This is also called an annual well check. Dental exams once or twice a year. Routine eye exams. Ask your health  care provider how often you should have your eyes checked. Personal lifestyle choices, including: Daily care of your teeth and gums. Regular physical activity. Eating a healthy diet. Avoiding tobacco and drug use. Limiting alcohol use. Practicing safe sex. Taking low-dose aspirin daily starting at age 25. Taking vitamin and mineral supplements as recommended by your health care provider. What happens during an annual well check? The services and screenings done by your health care provider during your annual well check will depend on your age, overall health, lifestyle risk factors, and family history of disease. Counseling  Your health care provider may ask you questions about your: Alcohol use. Tobacco use. Drug use. Emotional well-being. Home and relationship well-being. Sexual activity. Eating habits. Work and work Statistician. Method of birth control. Menstrual cycle. Pregnancy history. Screening  You may have the following tests or measurements: Height, weight, and BMI. Blood pressure. Lipid and cholesterol levels. These may be checked every 5 years, or more frequently if you are over 75 years old. Skin check. Lung cancer screening. You may have this screening every year starting at age 18 if you have a 30-pack-year history of smoking and currently smoke or have quit within the past 15 years. Fecal occult blood test (FOBT) of the stool. You may have this test every year starting at age  50. Flexible sigmoidoscopy or colonoscopy. You may have a sigmoidoscopy every 5 years or a colonoscopy every 10 years starting at age 8. Hepatitis C blood test. Hepatitis B blood test. Sexually transmitted disease (STD) testing. Diabetes screening. This is done by checking your blood sugar (glucose) after you have not eaten for a while (fasting). You may have this done every 1-3 years. Mammogram. This may be done every 1-2 years. Talk to your health care provider about when you should start  having regular mammograms. This may depend on whether you have a family history of breast cancer. BRCA-related cancer screening. This may be done if you have a family history of breast, ovarian, tubal, or peritoneal cancers. Pelvic exam and Pap test. This may be done every 3 years starting at age 67. Starting at age 49, this may be done every 5 years if you have a Pap test in combination with an HPV test. Bone density scan. This is done to screen for osteoporosis. You may have this scan if you are at high risk for osteoporosis. Discuss your test results, treatment options, and if necessary, the need for more tests with your health care provider. Vaccines  Your health care provider may recommend certain vaccines, such as: Influenza vaccine. This is recommended every year. Tetanus, diphtheria, and acellular pertussis (Tdap, Td) vaccine. You may need a Td booster every 10 years. Zoster vaccine. You may need this after age 45. Pneumococcal 13-valent conjugate (PCV13) vaccine. You may need this if you have certain conditions and were not previously vaccinated. Pneumococcal polysaccharide (PPSV23) vaccine. You may need one or two doses if you smoke cigarettes or if you have certain conditions. Talk to your health care provider about which screenings and vaccines you need and how often you need them. This information is not intended to replace advice given to you by your health care provider. Make sure you discuss any questions you have with your health care provider. Document Released: 02/11/2015 Document Revised: 10/05/2015 Document Reviewed: 11/16/2014 Elsevier Interactive Patient Education  2017 [employer] Prevention in the Home Falls can cause injuries. They can happen to people of all ages. There are many things you can do to make your home safe and to help prevent falls. What can I do on the outside of my home? Regularly fix the edges of walkways and driveways and fix any  cracks. Remove anything that might make you trip as you walk through a door, such as a raised step or threshold. Trim any bushes or trees on the path to your home. Use bright outdoor lighting. Clear any walking paths of anything that might make someone trip, such as rocks or tools. Regularly check to see if handrails are loose or broken. Make sure that both sides of any steps have handrails. Any raised decks and porches should have guardrails on the edges. Have any leaves, snow, or ice cleared regularly. Use sand or salt on walking paths during winter. Clean up any spills in your garage right away. This includes oil or grease spills. What can I do in the bathroom? Use night lights. Install grab bars by the toilet and in the tub and shower. Do not use towel bars as grab bars. Use non-skid mats or decals in the tub or shower. If you need to sit down in the shower, use a plastic, non-slip stool. Keep the floor dry. Clean up any water that spills on the floor as soon as it happens. Remove soap buildup  in the tub or shower regularly. Attach bath mats securely with double-sided non-slip rug tape. Do not have throw rugs and other things on the floor that can make you trip. What can I do in the bedroom? Use night lights. Make sure that you have a light by your bed that is easy to reach. Do not use any sheets or blankets that are too big for your bed. They should not hang down onto the floor. Have a firm chair that has side arms. You can use this for support while you get dressed. Do not have throw rugs and other things on the floor that can make you trip. What can I do in the kitchen? Clean up any spills right away. Avoid walking on wet floors. Keep items that you use a lot in easy-to-reach places. If you need to reach something above you, use a strong step stool that has a grab bar. Keep electrical cords out of the way. Do not use floor polish or wax that makes floors slippery. If you must  use wax, use non-skid floor wax. Do not have throw rugs and other things on the floor that can make you trip. What can I do with my stairs? Do not leave any items on the stairs. Make sure that there are handrails on both sides of the stairs and use them. Fix handrails that are broken or loose. Make sure that handrails are as long as the stairways. Check any carpeting to make sure that it is firmly attached to the stairs. Fix any carpet that is loose or worn. Avoid having throw rugs at the top or bottom of the stairs. If you do have throw rugs, attach them to the floor with carpet tape. Make sure that you have a light switch at the top of the stairs and the bottom of the stairs. If you do not have them, ask someone to add them for you. What else can I do to help prevent falls? Wear shoes that: Do not have high heels. Have rubber bottoms. Are comfortable and fit you well. Are closed at the toe. Do not wear sandals. If you use a stepladder: Make sure that it is fully opened. Do not climb a closed stepladder. Make sure that both sides of the stepladder are locked into place. Ask someone to hold it for you, if possible. Clearly mark and make sure that you can see: Any grab bars or handrails. First and last steps. Where the edge of each step is. Use tools that help you move around (mobility aids) if they are needed. These include: Canes. Walkers. Scooters. Crutches. Turn on the lights when you go into a dark area. Replace any light bulbs as soon as they burn out. Set up your furniture so you have a clear path. Avoid moving your furniture around. If any of your floors are uneven, fix them. If there are any pets around you, be aware of where they are. Review your medicines with your doctor. Some medicines can make you feel dizzy. This can increase your chance of falling. Ask your doctor what other things that you can do to help prevent falls. This information is not intended to replace  advice given to you by your health care provider. Make sure you discuss any questions you have with your health care provider. Document Released: 11/11/2008 Document Revised: 06/23/2015 Document Reviewed: 02/19/2014 Elsevier Interactive Patient Education  2017 [employer].

## 2022-04-03 ENCOUNTER — Ambulatory Visit (INDEPENDENT_AMBULATORY_CARE_PROVIDER_SITE_OTHER): Payer: Medicare PPO | Admitting: Psychology

## 2022-04-03 DIAGNOSIS — F319 Bipolar disorder, unspecified: Secondary | ICD-10-CM | POA: Diagnosis not present

## 2022-04-03 NOTE — Progress Notes (Signed)
SARABETH MAUNU is a 52 y.o. female patient   Treatment Plan: Date: 04/03/2022  Diagnosis 296.80 (Unspecified bipolar or related disorder) [n/a]  Symptoms Depressed or irritable mood. (Status: maintained) -- No Description Entered  Diminished interest in or enjoyment of activities. (Status: maintained) -- No Description Entered  Lack of energy. (Status: maintained) -- No Description Entered  Low self-esteem. (Status: maintained) -- No Description Entered  Medication Status compliance  Safety none  If Suicidal or Homicidal State Action Taken: unspecified  Current Risk: low Medications Adderall (Dosage: '20mg'$  4X/day)  Klonapin (Dosage: '1mg'$  3X/day)  Lamictal (Dosage: '200mg'$  2x/day)  Nuedexta (Dosage: '20mg'$ )  Vyralar (Dosage: '6mg'$ )  Xanax (Dosage: '1mg'$  3X/day)  Objectives Related Problem: Develop healthy cognitive patterns and beliefs about self and the world that lead to alleviation and help prevent the relapse of mood episodes. Description: State no longer having thoughts of self-harm. Target Date: 2023-01-08 Frequency: Daily Modality: individual Progress: 80%  Related Problem: Develop healthy cognitive patterns and beliefs about self and the world that lead to alleviation and help prevent the relapse of mood episodes. Description: Verbalize any history of past and present suicidal thoughts and actions. Target Date: 2023-01-08 Frequency: Daily Modality: individual Progress: 80%  Related Problem: Develop healthy cognitive patterns and beliefs about self and the world that lead to alleviation and help prevent the relapse of mood episodes. Description: Take prescribed medications as directed. Target Date: 2023-01-08 Frequency: Daily Modality: individual Progress: 90%  Related Problem: Develop healthy cognitive patterns and beliefs about self and the world that lead to alleviation and help prevent the relapse of mood episodes. Description: Discuss and  resolve troubling personal and interpersonal issues. Target Date: 2023-01-08 Frequency: Daily Modality: individual Progress: 123XX123 Resolve complicated grief       Target Date: 12-24       Progress: 20%  Client Response full compliance  Service Location Location, [name] [place], [place] [address] -virtual Service Code cpt [phone]  Emotion regulation skills  Related past to present  Rationally challenge thoughts or beliefs/cognitive restructuring  Identify/label emotions  Validate/empathize  Self care activities  Lifestyle change (exercise, nutrition)  Self-monitoring  Facilitate problem solving  Session notes:  f31.9 Bipolar Unspecified.  Goals: Mood stabilization, improved marital relationship, improve self esteem and elimination of self harm thoughts/tendencies. Also, patient struggles with leaving home comfortably and seeks to improve this ability. Target date for these goals is January, 2023. In addition, she needs to improve self care behaviors due to her weight and various medical conditions. Target date for this goal is Feb., 2022. Date revised to December, 2023 to allow time for behavioral implementation. Reports periodic outbursts of rage/anger. Seeks to minimize. Target date is December, 2023. Patient has realized improvement in all areas. Requests additional treatment to facilitate maintenance of progress. Goal date 12-24. Patient's husband died unexpectedly and she is suffering complicated grief. She needs additional counseling to address associated issues and manage feelings of loss, despair and anxiety. Goal date 12-24.  Meds: Vyralar ('6mg'$ ),Nuedexta (40+'20mg'$ /day), Adderall ('20mg'$  4X/day), Xanax ('1mg'$ , 3X/day), and Klonopin ('1mg'$  3X/day).  Patient has requested a video session. She is at home and I am in my home office.  Jadelin was contacted by disability services to determine if she is eligible for benefits. She thinks that they will require that she see a  psychiatrist for an evaluation. She reports that she has now lost about 75 pounds since [name]'s death and  her illness. She reports that she is eating better than before and is keeping her home stocked with food. She talked about missing [name] and hates being alone. She feels (and is) isolated. We discussed getting out of the house to visit her father. She needs to have a change of environment and more interpersonal interactions.                                                                                   [name], PhD  Time:2:15p-3:00p 45 minutes.                           n     n

## 2022-04-10 ENCOUNTER — Ambulatory Visit (INDEPENDENT_AMBULATORY_CARE_PROVIDER_SITE_OTHER): Payer: Medicare PPO | Admitting: Psychology

## 2022-04-10 DIAGNOSIS — F319 Bipolar disorder, unspecified: Secondary | ICD-10-CM

## 2022-04-11 NOTE — Progress Notes (Signed)
[name] is a 52 y.o. female patient   Treatment Plan: Date: 04/11/2022  Diagnosis 296.80 (Unspecified bipolar or related disorder) [n/a]  Symptoms Depressed or irritable mood. (Status: maintained) -- No Description Entered  Diminished interest in or enjoyment of activities. (Status: maintained) -- No Description Entered  Lack of energy. (Status: maintained) -- No Description Entered  Low self-esteem. (Status: maintained) -- No Description Entered  Medication Status compliance  Safety none  If Suicidal or Homicidal State Action Taken: unspecified  Current Risk: low Medications Adderall (Dosage: '20mg'$  4X/day)  Klonapin (Dosage: '1mg'$  3X/day)  Lamictal (Dosage: '200mg'$  2x/day)  Nuedexta (Dosage: '20mg'$ )  Vyralar (Dosage: '6mg'$ )  Xanax (Dosage: '1mg'$  3X/day)  Objectives Related Problem: Develop healthy cognitive patterns and beliefs about self and the world that lead to alleviation and help prevent the relapse of mood episodes. Description: State no longer having thoughts of self-harm. Target Date: 2023-01-08 Frequency: Daily Modality: individual Progress: 80%  Related Problem: Develop healthy cognitive patterns and beliefs about self and the world that lead to alleviation and help prevent the relapse of mood episodes. Description: Verbalize any history of past and present suicidal thoughts and actions. Target Date: 2023-01-08 Frequency: Daily Modality: individual Progress: 80%  Related Problem: Develop healthy cognitive patterns and beliefs about self and the world that lead to alleviation and help prevent the relapse of mood episodes. Description: Take prescribed medications as directed. Target Date: 2023-01-08 Frequency: Daily Modality: individual Progress: 90%  Related Problem: Develop healthy cognitive patterns and beliefs about self and the world that lead to alleviation and help prevent the relapse of mood  episodes. Description: Discuss and resolve troubling personal and interpersonal issues. Target Date: 2023-01-08 Frequency: Daily Modality: individual Progress: 123XX123 Resolve complicated grief       Target Date: 12-24       Progress: 20%  Client Response full compliance  Service Location Location, [name] [place], [place] [address] -virtual Service Code cpt [phone]  Emotion regulation skills  Related past to present  Rationally challenge thoughts or beliefs/cognitive restructuring  Identify/label emotions  Validate/empathize  Self care activities  Lifestyle change (exercise, nutrition)  Self-monitoring  Facilitate problem solving  Session notes:  f31.9 Bipolar Unspecified.  Goals: Mood stabilization, improved marital relationship, improve self esteem and elimination of self harm thoughts/tendencies. Also, patient struggles with leaving home comfortably and seeks to improve this ability. Target date for these goals is January, 2023. In addition, she needs to improve self care behaviors due to her weight and various medical conditions. Target date for this goal is Feb., 2022. Date revised to December, 2023 to allow time for behavioral implementation. Reports periodic outbursts of rage/anger. Seeks to minimize. Target date is December, 2023. Patient has realized improvement in all areas. Requests additional treatment to facilitate maintenance of progress. Goal date 12-24. Patient's husband died unexpectedly and she is suffering complicated grief. She needs additional counseling to address associated issues and manage feelings of loss, despair and anxiety. Goal date 12-24.  Meds: Vyralar ('6mg'$ ),Nuedexta (40+'20mg'$ /day), Adderall ('20mg'$  4X/day), Xanax ('1mg'$ , 3X/day), and Klonopin ('1mg'$  3X/day).  Patient has requested a video session. She is at home and I am in my home office.  [name] states that he has left the house and has been able to do some small errands. She is inhibited about her  drainage tube and doesn't like  being in public. The fact that she has driven herself to do errands is significant progress. She has not, however, been to visit her own father. She does not want to see him sad and knows he is struggling with the loss of her mother. She does speak with him at least once every day, but doesn't think she can handle seeing him in person. Her father is planning a memorial in May and family from all over the country are expected to come. [name] is hoping she will be able to help her father with the event. She is also improving with self care in that she is exercising (a little) and is eating better.                                                                                    [name]                                                                                                                                                                                        [name], PhD  Time:2:15p-3:00p 45 minutes.                           n     n

## 2022-04-12 ENCOUNTER — Other Ambulatory Visit: Payer: Medicare PPO

## 2022-04-17 ENCOUNTER — Ambulatory Visit (INDEPENDENT_AMBULATORY_CARE_PROVIDER_SITE_OTHER): Payer: Medicare PPO | Admitting: Psychology

## 2022-04-17 DIAGNOSIS — F319 Bipolar disorder, unspecified: Secondary | ICD-10-CM

## 2022-04-17 NOTE — Progress Notes (Signed)
[name] is a 52 y.o. female patient   Treatment Plan: Date: 04/17/2022  Diagnosis 296.80 (Unspecified bipolar or related disorder) [n/a]  Symptoms Depressed or irritable mood. (Status: maintained) -- No Description Entered  Diminished interest in or enjoyment of activities. (Status: maintained) -- No Description Entered  Lack of energy. (Status: maintained) -- No Description Entered  Low self-esteem. (Status: maintained) -- No Description Entered  Medication Status compliance  Safety none  If Suicidal or Homicidal State Action Taken: unspecified  Current Risk: low Medications Adderall (Dosage: 20mg  4X/day)  Klonapin (Dosage: 1mg  3X/day)  Lamictal (Dosage: 200mg  2x/day)  Nuedexta (Dosage: 20mg )  Vyralar (Dosage: 6mg )  Xanax (Dosage: 1mg  3X/day)  Objectives Related Problem: Develop healthy cognitive patterns and beliefs about self and the world that lead to alleviation and help prevent the relapse of mood episodes. Description: State no longer having thoughts of self-harm. Target Date: 2023-01-08 Frequency: Daily Modality: individual Progress: 80%  Related Problem: Develop healthy cognitive patterns and beliefs about self and the world that lead to alleviation and help prevent the relapse of mood episodes. Description: Verbalize any history of past and present suicidal thoughts and actions. Target Date: 2023-01-08 Frequency: Daily Modality: individual Progress: 80%  Related Problem: Develop healthy cognitive patterns and beliefs about self and the world that lead to alleviation and help prevent the relapse of mood episodes. Description: Take prescribed medications as directed. Target Date: 2023-01-08 Frequency: Daily Modality: individual Progress: 90%  Related Problem: Develop healthy cognitive patterns and beliefs about self and the world that lead to alleviation and help  prevent the relapse of mood episodes. Description: Discuss and resolve troubling personal and interpersonal issues. Target Date: 2023-01-08 Frequency: Daily Modality: individual Progress: 123XX123 Resolve complicated grief       Target Date: 12-24       Progress: 20%  Client Response full compliance  Service Location Location, [name] [place], [place] [address] -virtual Service Code cpt [phone]  Emotion regulation skills  Related past to present  Rationally challenge thoughts or beliefs/cognitive restructuring  Identify/label emotions  Validate/empathize  Self care activities  Lifestyle change (exercise, nutrition)  Self-monitoring  Facilitate problem solving  Session notes:  f31.9 Bipolar Unspecified.  Goals: Mood stabilization, improved marital relationship, improve self esteem and elimination of self harm thoughts/tendencies. Also, patient struggles with leaving home comfortably and seeks to improve this ability. Target date for these goals is January, 2023. In addition, she needs to improve self care behaviors due to her weight and various medical conditions. Target date for this goal is Feb., 2022. Date revised to December, 2023 to allow time for behavioral implementation. Reports periodic outbursts of rage/anger. Seeks to minimize. Target date is December, 2023. Patient has realized improvement in all areas. Requests additional treatment to facilitate maintenance of progress. Goal date 12-24. Patient's husband died unexpectedly and she is suffering complicated grief. She needs additional counseling to address associated issues and manage feelings of loss, despair and anxiety. Goal date 12-24.  Meds: Vyralar (6mg ),Nuedexta (40+20mg /day), Adderall (20mg  4X/day), Xanax (1mg , 3X/day), and Klonopin (1mg  3X/day).  Patient has requested a video session. She is at home and I am in my home office.  [name] says she "is not feeling well" today. She is having a headache,  which is very  unusual. Reluctant to take any pain meds due to her GI issues. She says that she is scheduled to have a psychiatric exam with Dr. Pia Mau. She talked about having been a bit more active this past week. Continues to communicate with family and rely on their support. She states she is not able to focus or concentrate on the session, due to her headache. She requests to cut the session short.                                                                                      [name]                                                                                                                                                                                        [name], PhD  Time:2:15p-2:45p 30 minutes.                           n     n

## 2022-04-18 ENCOUNTER — Ambulatory Visit
Admission: RE | Admit: 2022-04-18 | Discharge: 2022-04-18 | Disposition: A | Discharge status: Home / Self Care | Payer: Medicare PPO | Source: Ambulatory Visit | Attending: Surgery | Admitting: Surgery

## 2022-04-18 DIAGNOSIS — Z4682 Encounter for fitting and adjustment of non-vascular catheter: Secondary | ICD-10-CM | POA: Diagnosis not present

## 2022-04-18 DIAGNOSIS — K5720 Diverticulitis of large intestine with perforation and abscess without bleeding: Secondary | ICD-10-CM

## 2022-04-18 DIAGNOSIS — K651 Peritoneal abscess: Secondary | ICD-10-CM | POA: Diagnosis not present

## 2022-04-18 HISTORY — PX: IR RADIOLOGIST EVAL & MGMT: IMG5224

## 2022-04-18 MED ORDER — IOPAMIDOL (ISOVUE-300) INJECTION 61%
12.0000 mL | Freq: Once | INTRAVENOUS | Status: AC | PRN
  Administered 2022-04-18: 12 mL

## 2022-04-18 NOTE — Progress Notes (Signed)
Chief Complaint: Patient was seen in consultation today for left lower quadrant abscess drain at the request of Stechschulte,Paul J  Referring Physician(s): Stechschulte,Paul J  History of Present Illness:  [name] is a 52 y.o. female who presents today for clinic visit and left lower quadrant drain injection.  Patient denies fever, chills, abdominal pain, nausea or other complaints.  She reports she has follow-up surgical appointment. She reports minimal output to drain and is not flushing.  She underwent surgery 12/11/2021 and received surgical drains s/p ex lap with peritoneal lavage complicated by multiple intra-abdominal abscesses. A few days later, postop CT showed she had several more collections and 4 additional percutaneous drains were placed 12/20/2021.  Subsequently 3 of these showed resolution with no fistula and were removed and was left with LLQ drain.  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  She underwent drain injection in clinic 02/14/2022 that once again showed persistent bowel fistula.  Drain was assessed once again by injection 03/16/2022 that showed smaller but persistent fistula.  She presents to drain clinic today for evaluation and repeat drain injection.   Past Medical History:  Diagnosis Date   Acute systolic congestive heart failure (HCC)    Agoraphobia    Anxiety    panic attacks   Arthritis    osteoarthritis bilal. knees   Asthma    no per PFT 7/13; reports does not have asthma   Bipolar disorder ([place])    Carpal tunnel syndrome of left wrist 05/2011   Being evaluated for MS   Cor pulmonale ([place])    Depression    History of pleural effusion    Hyperlipidemia    Hypertension    Hypoxemia    history of - no home O2   IBS (irritable bowel syndrome)    Morbid obesity (HCC)    Obesity hypoventilation syndrome (HCC)    Pneumonia    Pre-diabetes     Prediabetes    Seasonal allergies    current runny nose   Seizures (HCC)    febrile seizure x 1 as a child. Several times   Sleep apnea sleep study 07/02/2010   uses CPAP nightly   Spinal stenosis in cervical region    Urinary incontinence     Past Surgical History:  Procedure Laterality Date   APPLICATION OF INTRAOPERATIVE CT SCAN N/A 05/14/2019   Procedure: APPLICATION OF INTRAOPERATIVE CT SCAN;  Surgeon: Erline Levine, MD;  Location: [facility];  Service: Neurosurgery;  Laterality: N/A;   CARPAL TUNNEL RELEASE  06/27/2011   Procedure: CARPAL TUNNEL RELEASE;  Surgeon: Wynonia Sours, MD;  Location: [facility];  Service: Orthopedics;  Laterality: Left;   CARPAL TUNNEL RELEASE  12/19/2011   Procedure: CARPAL TUNNEL RELEASE;  Surgeon: Wynonia Sours, MD;  Location: [facility];  Service: Orthopedics;  Laterality: Right;   INCISION AND DRAINAGE ABSCESS Left 08/27/2017   Procedure: INCISION AND DRAINAGE LEFT BUTTOCK  ABSCESS;  Surgeon: Excell Seltzer, MD;  Location: [facility];  Service: General;  Laterality: Left;   IR RADIOLOGIST EVAL & MGMT  03/16/2022   IR RADIOLOGIST EVAL & MGMT  04/18/2022   IR SINUS/FIST TUBE CHK-NON GI  01/03/2022   IR SINUS/FIST TUBE CHK-NON GI  01/03/2022   IR SINUS/FIST TUBE CHK-NON GI  01/03/2022   IR SINUS/FIST TUBE CHK-NON GI  01/03/2022   IRRIGATION AND DEBRIDEMENT BUTTOCKS Left 08/30/2017   Procedure: IRRIGATION AND DEBRIDEMENT  RE-EXCISION OF LEFT SUBCUTANEOUS BUTTOCKS ABCESS;  Surgeon: Kinsinger, Arta Bruce, MD;  Location: [facility];  Service: General;  Laterality: Left;   LAPAROSCOPY N/A 12/11/2021   Procedure: LAPAROSCOPIC PERITONEAL LAVAGE WITH DRAIN PLACEMENT;  Surgeon: Felicie Morn, MD;  Location: [facility];  Service: General;  Laterality: N/A;   MASS EXCISION  08/07/2010   right index   POSTERIOR CERVICAL FUSION/FORAMINOTOMY  09/11/2011   Procedure: POSTERIOR CERVICAL FUSION/FORAMINOTOMY LEVEL 1;  Surgeon: Erline Levine, MD;  Location: [facility];  Service: Neurosurgery;  Laterality: N/A;  Cervical Three-Four Posteior Cervical Fusion and Decompression.   POSTERIOR CERVICAL FUSION/FORAMINOTOMY N/A 05/14/2019   Procedure: Posterior cervical decompression/fusion Cervical four to Thoracic one with exploration/revision of Cervical threee-four fusion;  Surgeon: Erline Levine, MD;  Location: [facility];  Service: Neurosurgery;  Laterality: N/A;   TRIGGER FINGER RELEASE  12/19/2011   Procedure: RELEASE TRIGGER FINGER/A-1 PULLEY;  Surgeon: Wynonia Sours, MD;  Location: [facility];  Service: Orthopedics;  Laterality: Right;   TRIGGER FINGER RELEASE Left 12/31/2012   Procedure: RELEASE A-1 PULLEY LEFT THUMB;  Surgeon: Wynonia Sours, MD;  Location: [facility];  Service: Orthopedics;  Laterality: Left;   WISDOM TOOTH EXTRACTION      Allergies: Savella [milnacipran hcl]  Medications: Prior to Admission medications   Medication Sig Start Date End Date Taking? Authorizing Provider  albuterol (VENTOLIN HFA) 108 (90 Base) MCG/ACT inhaler Inhale 2 puffs into the lungs every 6 (six) hours as needed for wheezing or shortness of breath. 10/06/20   Rigoberto Noel, MD  ALPRAZolam Duanne Moron) 1 MG tablet Take 1 mg by mouth 3 (three) times daily.    [provider]  amphetamine-dextroamphetamine (ADDERALL) 20 MG tablet Take 20 mg by mouth in the morning, at noon, in the evening, and at bedtime.     [provider]  Ascorbic Acid (VITAMIN C) 500 MG tablet Take 1,000 mg by mouth daily.    [provider]  aspirin 81 MG tablet Take 1 tablet (81 mg total) by mouth daily. 05/21/19   Costella, Vista Mink, PA-C  atorvastatin (LIPITOR) 20 MG tablet TAKE 1 TABLET BY MOUTH EVERY DAY IN THE EVENING Patient taking differently: Take 20 mg by mouth daily. 03/15/21   Minus Breeding, MD  b complex vitamins tablet Take 1 tablet by mouth daily.    [provider]  Blood Glucose Monitoring Suppl (ACCU-CHEK AVIVA PLUS)  w/Device KIT As directed. 11/14/21   Martinique, Betty G, MD  Calcium Citrate (CITRACAL PO) Take 2 tablets by mouth daily.    [provider]  Cholecalciferol (VITAMIN D3) 5000 UNITS TABS Take 10,000 Units by mouth in the morning and at bedtime.    [provider]  Chromium Picolinate 500 MCG TABS Take 500 mcg by mouth daily.     [provider]  diclofenac Sodium (VOLTAREN) 1 % GEL Apply 2 g topically 3 (three) times daily as needed (knee pain).    [provider]  furosemide (LASIX) 40 MG tablet Take 2 tablets (80 mg total) by mouth 2 (two) times daily. Patient taking differently: Take 40-80 mg by mouth See admin instructions. Take 80 mg in the morning and 40 mg in the evening 12/05/21   Minus Breeding, MD  [name] 1.5/30 1.5-30 MG-MCG tablet Take 1 tablet by mouth at bedtime. 08/03/19   [provider]  levalbuterol Penne Lash HFA) 45 MCG/ACT inhaler Inhale 2 puffs into the lungs every 6 (six) hours as needed  for wheezing. 10/14/20   Rigoberto Noel, MD  lidocaine (LIDODERM) 5 % PLACE 1 PATCH ONTO THE SKIN DAILY AS NEEDED FOR KNEE PAIN. REMOVE AND DISCARD PATCH WITHIN 12 HOURS OR AS DIRECTED BY DOCTOR. Patient taking differently: Place 1 patch onto the skin as needed (knee pain). 05/22/21   Martinique, Betty G, MD  losartan (COZAAR) 25 MG tablet Take 1 tablet (25 mg total) by mouth 2 (two) times daily. 12/05/21   Minus Breeding, MD  LYRICA 300 MG capsule Take 300 mg by mouth 2 (two) times daily.  03/26/11   [provider]  MAGNESIUM PO Take 500 mg by mouth daily.     [provider]  Multiple Vitamin (MULTIVITAMIN) tablet Take 1 tablet by mouth daily.    [provider]  NUEDEXTA 20-10 MG CAPS Take 1 capsule by mouth every 12 (twelve) hours.  09/27/16   [provider]  Omega-3 Fatty Acids (FISH OIL) 1200 MG CAPS Take 2,400 mg by mouth daily.     [provider]  potassium chloride SA (KLOR-CON M20) 20 MEQ tablet Take 1  tablet (20 mEq total) by mouth daily. 04/14/21   Minus Breeding, MD  Sodium Chloride Flush (NORMAL SALINE FLUSH) 0.9 % SOLN Use to flush drain 1-2 times daily. 02/16/22     VRAYLAR 6 MG CAPS Take 6 mg by mouth every evening.  09/03/16   [provider]  Zinc 50 MG CAPS Take 50 mg by mouth in the morning and at bedtime.    [provider]     Family History  Problem Relation Age of Onset   Asthma Mother    Allergic rhinitis Mother    Multiple sclerosis Mother    Asthma Maternal Grandmother    Breast cancer Neg Hx     Social History   Socioeconomic History   Marital status: Widowed    Spouse name: Not on file   Number of children: Not on file   Years of education: Not on file   Highest education level: Bachelor's degree (e.g., BA, AB, BS)  Occupational History   Occupation: currently unemployed  Tobacco Use   Smoking status: Never   Smokeless tobacco: Never  Substance and Sexual Activity   Alcohol use: Yes    Comment: rare   Drug use: No   Sexual activity: Not on file  Other Topics Concern   Not on file  Social History Narrative   Lives in [place] Strain: Low Risk  (04/02/2022)   Overall Financial Resource Strain (CARDIA)    Difficulty of Paying Living Expenses: Not hard at all  Food Insecurity: No Food Insecurity (04/02/2022)   Hunger Vital Sign    Worried About Running Out of Food in the Last Year: Never true    Gobles in the Last Year: Never true  Transportation Needs: No Transportation Needs (04/02/2022)   PRAPARE - Hydrologist (Medical): No    Lack of Transportation (Non-Medical): No  Physical Activity: Insufficiently Active (04/02/2022)   Exercise Vital Sign    Days of Exercise per Week: 2 days    Minutes of Exercise per Session: 30 min  Stress: No Stress Concern Present (04/02/2022)   [place]    Feeling of Stress : Not at all  Social Connections: Moderately Integrated (04/02/2022)   Social  Connection and Isolation Panel [NHANES]    Frequency of Communication with Friends and Family: More than three times a week    Frequency of Social Gatherings with Friends and Family: More than three times a week    Attends Religious Services: More than 4 times per year    Active Member of [employer] or Organizations: Yes    Attends Archivist Meetings: More than 4 times per year    Marital Status: Widowed    Review of Systems: A 12 point ROS discussed and pertinent positives are indicated in the HPI above.  All other systems are negative.  Review of Systems  All other systems reviewed and are negative.   Vital Signs: There were no vitals taken for this visit.    Physical Exam Vitals reviewed.  Constitutional:      General: She is not in acute distress.    Appearance: Normal appearance. She is not ill-appearing.  HENT:     Head: Normocephalic and atraumatic.  Eyes:     Extraocular Movements: Extraocular movements intact.     Pupils: Pupils are equal, round, and reactive to light.  Pulmonary:     Effort: Pulmonary effort is normal. No respiratory distress.  Abdominal:     Comments: Site unremarkable with sutures in place. Scant OP in gravity bag.  Drain removed intact.  No bleeding, oozing, redness or other signs of infection.  Clean dressing applied.  Skin:    General: Skin is warm and dry.  Neurological:     Mental Status: She is alert and oriented to person, place, and time.  Psychiatric:        Mood and Affect: Mood normal.        Behavior: Behavior normal.        Thought Content: Thought content normal.        Judgment: Judgment normal.        Imaging: IR Radiologist Eval & Mgmt  Result Date: 04/18/2022 EXAM: ESTABLISHED PATIENT OFFICE VISIT CHIEF COMPLAINT: See below HISTORY OF PRESENT ILLNESS: See below REVIEW OF SYSTEMS: See below PHYSICAL  EXAMINATION: See below ASSESSMENT AND PLAN: Please refer to completed note in the electronic medical record on [place], MD [facility] Electronically Signed   By: Michaelle Birks M.D.   On: 04/18/2022 13:59    Labs:  CBC: Recent Labs    12/29/21 0824 01/02/22 0530 01/03/22 0301 01/15/22 1438  WBC 8.7 8.9 9.4 7.9  HGB 8.9* 9.4* 9.5* 12.1  HCT 31.3* 30.4* 29.9* 37.7  PLT 331 383 376 541.0*    COAGS: No results for input(s): "INR", "APTT" in the last 8760 hours.  BMP: Recent Labs    12/31/21 0315 01/01/22 0517 01/02/22 0530 01/03/22 0301 01/15/22 1438  NA 139 138 138 140 142  K 4.5 4.3 4.3 4.4 3.8  CL 100 99 100 96* 101  CO2 30 30 31 30 26   GLUCOSE 211* 201* 177* 181* 140*  BUN 11 12 13 10 10   CALCIUM 8.6* 8.5* 8.5* 9.4 9.9  CREATININE 0.65 0.60 0.60 0.72 0.71  GFRNONAA >60 >60 >60 >60  --     LIVER FUNCTION TESTS: Recent Labs    12/24/21 0412 12/25/21 0411 12/28/21 0948 01/01/22 0517  BILITOT <0.1* 0.4 <0.1* 0.2*  AST 20 19 34 29  ALT 11 12 22 22   ALKPHOS 87 74 92 98  PROT 5.4* 5.3* 6.1* 6.0*  ALBUMIN 1.5* <1.5* 1.7* 1.5*  TUMOR MARKERS: No results for input(s): "AFPTM", "CEA", "CA199", "CHROMGRNA" in the last 8760 hours.  Assessment and Plan:  Ms. 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  Discussed case with Dr. Maryelizabeth Kaufmann who recommends drain removal at this time.  LLQ drain removed intact, no complications,  patient tolerated procedure well, dressing applied to exit site.  Post-removal instructions: - Okay  to shower/sponge bath 24 hours post-removal. - No submerging (swimming, bathing) for 7 days post-removal. - Keep the dressing/bandage on to take shower, take dressing/bandage off and pat dry  the area completely before placing a new dressing/bandage  -  Look for signs and symptoms of infection such as reddening of skin, pus like drainage,     fever and/or chills - Change dressing PRN until site fully healed.    Patient verbalized understanding.    Thank you for this interesting consult.  I greatly enjoyed meeting [name] and look forward to participating in their care.  A copy of this report was sent to the requesting provider on this date.  Electronically Signed: Tyson Alias 04/18/2022, 2:24 PM   I spent a total of 20 minutes in face to face in clinical consultation, greater than 50% of which was counseling/coordinating care for left lower quadrant abscess drain.

## 2022-04-24 ENCOUNTER — Ambulatory Visit (INDEPENDENT_AMBULATORY_CARE_PROVIDER_SITE_OTHER): Payer: Medicare PPO | Admitting: Psychology

## 2022-04-24 DIAGNOSIS — F319 Bipolar disorder, unspecified: Secondary | ICD-10-CM | POA: Diagnosis not present

## 2022-04-24 NOTE — Progress Notes (Signed)
[name] is a 52 y.o. female patient   Treatment Plan: Date: 04/24/2022  Diagnosis 296.80 (Unspecified bipolar or related disorder) [n/a]  Symptoms Depressed or irritable mood. (Status: maintained) -- No Description Entered  Diminished interest in or enjoyment of activities. (Status: maintained) -- No Description Entered  Lack of energy. (Status: maintained) -- No Description Entered  Low self-esteem. (Status: maintained) -- No Description Entered  Medication Status compliance  Safety none  If Suicidal or Homicidal State Action Taken: unspecified  Current Risk: low Medications Adderall (Dosage: 20mg  4X/day)  Klonapin (Dosage: 1mg  3X/day)  Lamictal (Dosage: 200mg  2x/day)  Nuedexta (Dosage: 20mg )  Vyralar (Dosage: 6mg )  Xanax (Dosage: 1mg  3X/day)  Objectives Related Problem: Develop healthy cognitive patterns and beliefs about self and the world that lead to alleviation and help prevent the relapse of mood episodes. Description: State no longer having thoughts of self-harm. Target Date: 2023-01-08 Frequency: Daily Modality: individual Progress: 80%  Related Problem: Develop healthy cognitive patterns and beliefs about self and the world that lead to alleviation and help prevent the relapse of mood episodes. Description: Verbalize any history of past and present suicidal thoughts and actions. Target Date: 2023-01-08 Frequency: Daily Modality: individual Progress: 80%  Related Problem: Develop healthy cognitive patterns and beliefs about self and the world that lead to alleviation and help prevent the relapse of mood episodes. Description: Take prescribed medications as directed. Target Date: 2023-01-08 Frequency: Daily Modality: individual Progress: 90%  Related Problem: Develop healthy cognitive patterns and beliefs about self and the world that lead  to alleviation and help prevent the relapse of mood episodes. Description: Discuss and resolve troubling personal and interpersonal issues. Target Date: 2023-01-08 Frequency: Daily Modality: individual Progress: 123XX123 Resolve complicated grief       Target Date: 12-24       Progress: 20%  Client Response full compliance  Service Location Location, [name] [place], [place] [address] -virtual Service Code cpt [phone]  Emotion regulation skills  Related past to present  Rationally challenge thoughts or beliefs/cognitive restructuring  Identify/label emotions  Validate/empathize  Self care activities  Lifestyle change (exercise, nutrition)  Self-monitoring  Facilitate problem solving  Session notes:  f31.9 Bipolar Unspecified.  Goals: Mood stabilization, improved marital relationship, improve self esteem and elimination of self harm thoughts/tendencies. Also, patient struggles with leaving home comfortably and seeks to improve this ability. Target date for these goals is January, 2023. In addition, she needs to improve self care behaviors due to her weight and various medical conditions. Target date for this goal is Feb., 2022. Date revised to December, 2023 to allow time for behavioral implementation. Reports periodic outbursts of rage/anger. Seeks to minimize. Target date is December, 2023. Patient has realized improvement in all areas. Requests additional treatment to facilitate maintenance of progress. Goal date 12-24. Patient's husband died unexpectedly and she is suffering complicated grief. She needs additional counseling to address associated issues and manage feelings of loss, despair and anxiety. Goal date 12-24.  Meds: Vyralar (6mg ),Nuedexta (40+20mg /day), Adderall (20mg  4X/day), Xanax (1mg , 3X/day), and Klonopin (1mg  3X/day).  Patient has requested a video session. She is at home and I am in my home  office.  [name] says her drain was finally removed. She is very  relieved. She is planning to go visit her dad today, only the second time in a long time. The removal of the drain gives her much greater confidence. Also, her friend ([name]) is coming over to cook dinner for her tonight. [name] will have psychiatric interview on April 1st for her disability. Will meet with Dr. Pia Mau in [place]. She is nervous about the interview only because she struggles to go out in public and talking with a "stranger". We talked about the importance of answering honest and thoroughly, giving a summary of her complicated history.                                                                                            [name]                                                                                                                                                                                        Jessica Morel, PhD  Time:2:15p-3:00p 45 minutes.                           n     n

## 2022-05-01 ENCOUNTER — Ambulatory Visit (INDEPENDENT_AMBULATORY_CARE_PROVIDER_SITE_OTHER): Payer: Medicare PPO | Admitting: Psychology

## 2022-05-01 DIAGNOSIS — F319 Bipolar disorder, unspecified: Secondary | ICD-10-CM

## 2022-05-01 NOTE — Progress Notes (Signed)
[name] is a 52 y.o. female patient   Treatment Plan: Date: 05/01/2022  Diagnosis 296.80 (Unspecified bipolar or related disorder) [n/a]  Symptoms Depressed or irritable mood. (Status: maintained) -- No Description Entered  Diminished interest in or enjoyment of activities. (Status: maintained) -- No Description Entered  Lack of energy. (Status: maintained) -- No Description Entered  Low self-esteem. (Status: maintained) -- No Description Entered  Medication Status compliance  Safety none  If Suicidal or Homicidal State Action Taken: unspecified  Current Risk: low Medications Adderall (Dosage: 20mg  4X/day)  Klonapin (Dosage: 1mg  3X/day)  Lamictal (Dosage: 200mg  2x/day)  Nuedexta (Dosage: 20mg )  Vyralar (Dosage: 6mg )  Xanax (Dosage: 1mg  3X/day)  Objectives Related Problem: Develop healthy cognitive patterns and beliefs about self and the world that lead to alleviation and help prevent the relapse of mood episodes. Description: State no longer having thoughts of self-harm. Target Date: 2023-01-08 Frequency: Daily Modality: individual Progress: 80%  Related Problem: Develop healthy cognitive patterns and beliefs about self and the world that lead to alleviation and help prevent the relapse of mood episodes. Description: Verbalize any history of past and present suicidal thoughts and actions. Target Date: 2023-01-08 Frequency: Daily Modality: individual Progress: 80%  Related Problem: Develop healthy cognitive patterns and beliefs about self and the world that lead to alleviation and help prevent the relapse of mood episodes. Description: Take prescribed medications as directed. Target Date: 2023-01-08 Frequency: Daily Modality: individual Progress: 90%  Related Problem: Develop healthy cognitive patterns and beliefs about self and the world that lead to alleviation and help prevent the relapse of mood episodes. Description: Discuss and resolve troubling  personal and interpersonal issues. Target Date: 2023-01-08 Frequency: Daily Modality: individual Progress: 123XX123 Resolve complicated grief       Target Date: 12-24       Progress: 20%  Client Response full compliance  Service Location Location, [name] [place],  [address] -virtual Service Code cpt [phone]  Emotion regulation skills  Related past to present  Rationally challenge thoughts or beliefs/cognitive restructuring  Identify/label emotions  Validate/empathize  Self care activities  Lifestyle change (exercise, nutrition)  Self-monitoring  Facilitate problem solving  Session notes:  f31.9 Bipolar Unspecified.  Goals: Mood stabilization, improved marital relationship, improve self esteem and elimination of self harm thoughts/tendencies. Also, patient struggles with leaving home comfortably and seeks to improve this ability. Target date for these goals is January, 2023. In addition, she needs to improve self care behaviors due to her weight and various medical conditions. Target date for this goal is Feb., 2022. Date revised to December, 2023 to allow time for behavioral implementation. Reports periodic outbursts of rage/anger. Seeks to minimize. Target date is December, 2023. Patient has realized improvement in all areas. Requests additional treatment to facilitate maintenance of progress. Goal date 12-24. Patient's husband died unexpectedly and she is suffering complicated grief. She needs additional counseling to address associated issues and manage feelings of loss, despair and anxiety. Goal date 12-24.  Meds: Vyralar (6mg ),Nuedexta (40+20mg /day), Adderall (20mg  4X/day), Xanax (1mg , 3X/day), and Klonopin (1mg  3X/day).  Patient has requested a video session. She is at home and I am in my home office.  [name] says that today would have been her 60th anniversary. It has been a difficult time leading up to this and she says "things are just now starting to feel real".  Very in touch with the loneliness. Encourage her to do something to [employer] or to acknowledge their union. She has  not been able to come up with any ideas. She had the meeting with the psychiatrist to evaluate her her for disability. Says that it was "uneventful" and that he did not ask any difficult questions she could not answer. She will also meet with an orthopedic doctor before a decision is rendered. She does not express any concern.  Her sleep has been terrible lately. Discussed havinga conversation with Dr. Toy Care to see if she needs something to help her sleep. She agrees that if this continues, she will consider a medicine option.                                                                                                  [name]                                                                                                                                                                                        [name], PhD  Time:2:15p-3:00p 45 minutes.                           n     n

## 2022-05-08 ENCOUNTER — Ambulatory Visit (INDEPENDENT_AMBULATORY_CARE_PROVIDER_SITE_OTHER): Payer: Medicare PPO | Admitting: Psychology

## 2022-05-08 DIAGNOSIS — F319 Bipolar disorder, unspecified: Secondary | ICD-10-CM | POA: Diagnosis not present

## 2022-05-08 NOTE — Progress Notes (Signed)
TIMIKO GAU is a 52 y.o. female patient   Treatment Plan: Date: 05/08/2022  Diagnosis 296.80 (Unspecified bipolar or related disorder) [n/a]  Symptoms Depressed or irritable mood. (Status: maintained) -- No Description Entered  Diminished interest in or enjoyment of activities. (Status: maintained) -- No Description Entered  Lack of energy. (Status: maintained) -- No Description Entered  Low self-esteem. (Status: maintained) -- No Description Entered  Medication Status compliance  Safety none  If Suicidal or Homicidal State Action Taken: unspecified  Current Risk: low Medications Adderall (Dosage: 20mg  4X/day)  Klonapin (Dosage: 1mg  3X/day)  Lamictal (Dosage: 200mg  2x/day)  Nuedexta (Dosage: 20mg )  Vyralar (Dosage: 6mg )  Xanax (Dosage: 1mg  3X/day)  Objectives Related Problem: Develop healthy cognitive patterns and beliefs about self and the world that lead to alleviation and help prevent the relapse of mood episodes. Description: State no longer having thoughts of self-harm. Target Date: 2023-01-08 Frequency: Daily Modality: individual Progress: 80%  Related Problem: Develop healthy cognitive patterns and beliefs about self and the world that lead to alleviation and help prevent the relapse of mood episodes. Description: Verbalize any history of past and present suicidal thoughts and actions. Target Date: 2023-01-08 Frequency: Daily Modality: individual Progress: 80%  Related Problem: Develop healthy cognitive patterns and beliefs about self and the world that lead to alleviation and help prevent the relapse of mood episodes. Description: Take prescribed medications as directed. Target Date: 2023-01-08 Frequency: Daily Modality: individual Progress: 90%  Related Problem: Develop healthy cognitive patterns and beliefs about self and the world that lead to alleviation and help prevent the relapse of mood episodes. Description:  Discuss and resolve troubling personal and interpersonal issues. Target Date: 2023-01-08 Frequency: Daily Modality: individual Progress: 80% Resolve complicated grief       Target Date: 12-24       Progress: 20%  Client Response full compliance  Service Location Location, [address] [place], [place] [address] -virtual Service Code cpt [phone]  Emotion regulation skills  Related past to present  Rationally challenge thoughts or beliefs/cognitive restructuring  Identify/label emotions  Validate/empathize  Self care activities  Lifestyle change (exercise, nutrition)  Self-monitoring  Facilitate problem solving  Session notes:  f31.9 Bipolar Unspecified.  Goals: Mood stabilization, improved marital relationship, improve self esteem and elimination of self harm thoughts/tendencies. Also, patient struggles with leaving home comfortably and seeks to improve this ability. Target date for these goals is January, 2023. In addition, she needs to improve self care behaviors due to her weight and various medical conditions. Target date for this goal is Feb., 2022. Date revised to December, 2023 to allow time for behavioral implementation. Reports periodic outbursts of rage/anger. Seeks to minimize. Target date is December, 2023. Patient has realized improvement in all areas. Requests additional treatment to facilitate maintenance of progress. Goal date 12-24. Patient's husband died unexpectedly and she is suffering complicated grief. She needs additional counseling to address associated issues and manage feelings of loss, despair and anxiety. Goal date 12-24.  Meds: Vyralar (6mg ),Nuedexta (40+20mg /day), Adderall (20mg  4X/day), Xanax (1mg , 3X/day), and Klonopin (1mg  3X/day).  Patient has requested a video session. She is at home and I am in my home office.  Brookelyn states she is not feeling well today and is having a number of GI symptoms. She has called her doctor and has an appointment on the  18th. 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 She says that she is not doing very much and spends a lot of time in bed. Still is not sleeping well. She says she feels that her body is going to "give out" and that she will die. Says "I am so tired of being alone". She has been thinking a lot about a variety of things in her life. Suggested she get back to journaling, as she had done pre-pandemic. Says she is not likely to do it. Her loneliness is factoring in to her despair. She is clear to say she is not suicidal and has no plan to hurt herself. She just doesn't have a desire to work hard to heal.                                                                                                     .                                                                                                                                                                                        [name], PhD  Time:2:15p-3:00p 45 minutes.                           n     n

## 2022-05-09 DIAGNOSIS — R197 Diarrhea, unspecified: Secondary | ICD-10-CM | POA: Diagnosis not present

## 2022-05-09 DIAGNOSIS — R5383 Other fatigue: Secondary | ICD-10-CM | POA: Diagnosis not present

## 2022-05-09 DIAGNOSIS — R1032 Left lower quadrant pain: Secondary | ICD-10-CM | POA: Diagnosis not present

## 2022-05-09 DIAGNOSIS — R630 Anorexia: Secondary | ICD-10-CM | POA: Diagnosis not present

## 2022-05-09 DIAGNOSIS — R110 Nausea: Secondary | ICD-10-CM | POA: Diagnosis not present

## 2022-05-10 ENCOUNTER — Inpatient Hospital Stay (HOSPITAL_COMMUNITY)
Admission: EM | Admit: 2022-05-10 | Discharge: 2022-05-15 | DRG: 372 | Disposition: A | Discharge status: Home / Self Care | Payer: Medicare PPO | Attending: Internal Medicine | Admitting: Internal Medicine

## 2022-05-10 ENCOUNTER — Other Ambulatory Visit: Payer: Self-pay

## 2022-05-10 ENCOUNTER — Emergency Department (HOSPITAL_COMMUNITY): Payer: Medicare PPO

## 2022-05-10 ENCOUNTER — Other Ambulatory Visit: Payer: Self-pay | Admitting: Surgery

## 2022-05-10 ENCOUNTER — Encounter (HOSPITAL_COMMUNITY): Payer: Self-pay

## 2022-05-10 DIAGNOSIS — Z825 Family history of asthma and other chronic lower respiratory diseases: Secondary | ICD-10-CM

## 2022-05-10 DIAGNOSIS — T502X5A Adverse effect of carbonic-anhydrase inhibitors, benzothiadiazides and other diuretics, initial encounter: Secondary | ICD-10-CM | POA: Diagnosis not present

## 2022-05-10 DIAGNOSIS — Z1611 Resistance to penicillins: Secondary | ICD-10-CM | POA: Diagnosis present

## 2022-05-10 DIAGNOSIS — R188 Other ascites: Secondary | ICD-10-CM | POA: Diagnosis present

## 2022-05-10 DIAGNOSIS — J45909 Unspecified asthma, uncomplicated: Secondary | ICD-10-CM | POA: Diagnosis present

## 2022-05-10 DIAGNOSIS — I420 Dilated cardiomyopathy: Secondary | ICD-10-CM | POA: Diagnosis not present

## 2022-05-10 DIAGNOSIS — K651 Peritoneal abscess: Principal | ICD-10-CM | POA: Diagnosis present

## 2022-05-10 DIAGNOSIS — G4733 Obstructive sleep apnea (adult) (pediatric): Secondary | ICD-10-CM

## 2022-05-10 DIAGNOSIS — I5042 Chronic combined systolic (congestive) and diastolic (congestive) heart failure: Secondary | ICD-10-CM | POA: Diagnosis present

## 2022-05-10 DIAGNOSIS — Z01810 Encounter for preprocedural cardiovascular examination: Secondary | ICD-10-CM | POA: Diagnosis not present

## 2022-05-10 DIAGNOSIS — I272 Pulmonary hypertension, unspecified: Secondary | ICD-10-CM | POA: Diagnosis not present

## 2022-05-10 DIAGNOSIS — Z6841 Body Mass Index (BMI) 40.0 and over, adult: Secondary | ICD-10-CM | POA: Diagnosis not present

## 2022-05-10 DIAGNOSIS — E785 Hyperlipidemia, unspecified: Secondary | ICD-10-CM | POA: Diagnosis present

## 2022-05-10 DIAGNOSIS — I2729 Other secondary pulmonary hypertension: Secondary | ICD-10-CM | POA: Diagnosis not present

## 2022-05-10 DIAGNOSIS — F4000 Agoraphobia, unspecified: Secondary | ICD-10-CM | POA: Diagnosis present

## 2022-05-10 DIAGNOSIS — I110 Hypertensive heart disease with heart failure: Secondary | ICD-10-CM | POA: Diagnosis present

## 2022-05-10 DIAGNOSIS — Z981 Arthrodesis status: Secondary | ICD-10-CM

## 2022-05-10 DIAGNOSIS — Z7982 Long term (current) use of aspirin: Secondary | ICD-10-CM

## 2022-05-10 DIAGNOSIS — Z820 Family history of epilepsy and other diseases of the nervous system: Secondary | ICD-10-CM

## 2022-05-10 DIAGNOSIS — R0981 Nasal congestion: Secondary | ICD-10-CM | POA: Diagnosis not present

## 2022-05-10 DIAGNOSIS — R197 Diarrhea, unspecified: Secondary | ICD-10-CM

## 2022-05-10 DIAGNOSIS — Z5982 Transportation insecurity: Secondary | ICD-10-CM

## 2022-05-10 DIAGNOSIS — R062 Wheezing: Secondary | ICD-10-CM | POA: Diagnosis not present

## 2022-05-10 DIAGNOSIS — R1032 Left lower quadrant pain: Secondary | ICD-10-CM

## 2022-05-10 DIAGNOSIS — R110 Nausea: Secondary | ICD-10-CM

## 2022-05-10 DIAGNOSIS — Z560 Unemployment, unspecified: Secondary | ICD-10-CM | POA: Diagnosis not present

## 2022-05-10 DIAGNOSIS — K5780 Diverticulitis of intestine, part unspecified, with perforation and abscess without bleeding: Secondary | ICD-10-CM | POA: Diagnosis present

## 2022-05-10 DIAGNOSIS — Z888 Allergy status to other drugs, medicaments and biological substances status: Secondary | ICD-10-CM

## 2022-05-10 DIAGNOSIS — K5720 Diverticulitis of large intestine with perforation and abscess without bleeding: Secondary | ICD-10-CM | POA: Diagnosis not present

## 2022-05-10 DIAGNOSIS — Y828 Other medical devices associated with adverse incidents: Secondary | ICD-10-CM | POA: Diagnosis present

## 2022-05-10 DIAGNOSIS — R630 Anorexia: Secondary | ICD-10-CM

## 2022-05-10 DIAGNOSIS — L0291 Cutaneous abscess, unspecified: Principal | ICD-10-CM

## 2022-05-10 DIAGNOSIS — T8130XA Disruption of wound, unspecified, initial encounter: Secondary | ICD-10-CM | POA: Diagnosis present

## 2022-05-10 DIAGNOSIS — I10 Essential (primary) hypertension: Secondary | ICD-10-CM | POA: Diagnosis present

## 2022-05-10 DIAGNOSIS — K5792 Diverticulitis of intestine, part unspecified, without perforation or abscess without bleeding: Secondary | ICD-10-CM

## 2022-05-10 DIAGNOSIS — R58 Hemorrhage, not elsewhere classified: Secondary | ICD-10-CM | POA: Diagnosis not present

## 2022-05-10 DIAGNOSIS — R5383 Other fatigue: Secondary | ICD-10-CM

## 2022-05-10 DIAGNOSIS — Z4803 Encounter for change or removal of drains: Secondary | ICD-10-CM | POA: Diagnosis not present

## 2022-05-10 DIAGNOSIS — F319 Bipolar disorder, unspecified: Secondary | ICD-10-CM | POA: Diagnosis not present

## 2022-05-10 DIAGNOSIS — E1169 Type 2 diabetes mellitus with other specified complication: Secondary | ICD-10-CM | POA: Diagnosis present

## 2022-05-10 DIAGNOSIS — Z79899 Other long term (current) drug therapy: Secondary | ICD-10-CM | POA: Diagnosis not present

## 2022-05-10 DIAGNOSIS — I700 Atherosclerosis of aorta: Secondary | ICD-10-CM | POA: Diagnosis not present

## 2022-05-10 DIAGNOSIS — E876 Hypokalemia: Secondary | ICD-10-CM | POA: Diagnosis not present

## 2022-05-10 DIAGNOSIS — I5032 Chronic diastolic (congestive) heart failure: Secondary | ICD-10-CM | POA: Diagnosis not present

## 2022-05-10 DIAGNOSIS — I501 Left ventricular failure: Secondary | ICD-10-CM | POA: Diagnosis not present

## 2022-05-10 LAB — CBC WITH DIFFERENTIAL/PLATELET
Abs Immature Granulocytes: 0.05 10*3/uL (ref 0.00–0.07)
Basophils Absolute: 0 10*3/uL (ref 0.0–0.1)
Basophils Relative: 0 %
Eosinophils Absolute: 0.1 10*3/uL (ref 0.0–0.5)
Eosinophils Relative: 1 %
HCT: 45.7 % (ref 36.0–46.0)
Hemoglobin: 15 g/dL (ref 12.0–15.0)
Immature Granulocytes: 1 %
Lymphocytes Relative: 13 %
Lymphs Abs: 1.2 10*3/uL (ref 0.7–4.0)
MCH: 28.8 pg (ref 26.0–34.0)
MCHC: 32.8 g/dL (ref 30.0–36.0)
MCV: 87.7 fL (ref 80.0–100.0)
Monocytes Absolute: 0.6 10*3/uL (ref 0.1–1.0)
Monocytes Relative: 6 %
Neutro Abs: 7.9 10*3/uL — ABNORMAL HIGH (ref 1.7–7.7)
Neutrophils Relative %: 79 %
Platelets: 381 10*3/uL (ref 150–400)
RBC: 5.21 MIL/uL — ABNORMAL HIGH (ref 3.87–5.11)
RDW: 15.1 % (ref 11.5–15.5)
WBC: 9.8 10*3/uL (ref 4.0–10.5)
nRBC: 0 % (ref 0.0–0.2)

## 2022-05-10 LAB — COMPREHENSIVE METABOLIC PANEL
ALT: 27 U/L (ref 0–44)
AST: 29 U/L (ref 15–41)
Albumin: 2.7 g/dL — ABNORMAL LOW (ref 3.5–5.0)
Alkaline Phosphatase: 95 U/L (ref 38–126)
Anion gap: 14 (ref 5–15)
BUN: 15 mg/dL (ref 6–20)
CO2: 23 mmol/L (ref 22–32)
Calcium: 9.5 mg/dL (ref 8.9–10.3)
Chloride: 99 mmol/L (ref 98–111)
Creatinine, Ser: 0.77 mg/dL (ref 0.44–1.00)
GFR, Estimated: >60 mL/min (ref >60)
Glucose, Bld: 208 mg/dL — ABNORMAL HIGH (ref 70–99)
Potassium: 3.6 mmol/L (ref 3.5–5.1)
Sodium: 136 mmol/L (ref 135–145)
Total Bilirubin: 0.9 mg/dL (ref 0.3–1.2)
Total Protein: 6.8 g/dL (ref 6.5–8.1)

## 2022-05-10 LAB — LIPASE, BLOOD: Lipase: 27 U/L (ref 11–51)

## 2022-05-10 LAB — HIV ANTIBODY (ROUTINE TESTING W REFLEX): HIV Screen 4th Generation wRfx: NONREACTIVE

## 2022-05-10 MED ORDER — FENTANYL CITRATE PF 50 MCG/ML IJ SOSY
50.0000 ug | PREFILLED_SYRINGE | Freq: Once | INTRAMUSCULAR | Status: AC
  Administered 2022-05-10: 50 ug via INTRAVENOUS
  Filled 2022-05-10: qty 1

## 2022-05-10 MED ORDER — INSULIN ASPART 100 UNIT/ML IJ SOLN
0.0000 [IU] | INTRAMUSCULAR | Status: DC
  Administered 2022-05-11: 3 [IU] via SUBCUTANEOUS

## 2022-05-10 MED ORDER — MELATONIN 5 MG PO TABS
10.0000 mg | ORAL_TABLET | Freq: Every evening | ORAL | Status: DC | PRN
  Administered 2022-05-10 – 2022-05-14 (×4): 10 mg via ORAL
  Filled 2022-05-10 (×4): qty 2

## 2022-05-10 MED ORDER — ATORVASTATIN CALCIUM 10 MG PO TABS
20.0000 mg | ORAL_TABLET | Freq: Every day | ORAL | Status: DC
  Administered 2022-05-11 – 2022-05-15 (×5): 20 mg via ORAL
  Filled 2022-05-10 (×5): qty 2

## 2022-05-10 MED ORDER — ONDANSETRON HCL 4 MG/2ML IJ SOLN: 4.0000 mg | Freq: Four times a day (QID) | INTRAMUSCULAR | Status: DC | PRN

## 2022-05-10 MED ORDER — HYDROMORPHONE HCL 1 MG/ML IJ SOLN
0.5000 mg | INTRAMUSCULAR | Status: DC | PRN
  Administered 2022-05-11 – 2022-05-14 (×6): 1 mg via INTRAVENOUS
  Filled 2022-05-10 (×6): qty 1

## 2022-05-10 MED ORDER — HEPARIN SODIUM (PORCINE) 5000 UNIT/ML IJ SOLN
5000.0000 [IU] | Freq: Three times a day (TID) | INTRAMUSCULAR | Status: DC
  Administered 2022-05-10 – 2022-05-11 (×2): 5000 [IU] via SUBCUTANEOUS
  Filled 2022-05-10 (×3): qty 1

## 2022-05-10 MED ORDER — OXYCODONE HCL 5 MG PO TABS
5.0000 mg | ORAL_TABLET | ORAL | Status: DC | PRN
  Administered 2022-05-10 – 2022-05-15 (×9): 5 mg via ORAL
  Filled 2022-05-10 (×9): qty 1

## 2022-05-10 MED ORDER — PIPERACILLIN-TAZOBACTAM 3.375 G IVPB
3.3750 g | Freq: Three times a day (TID) | INTRAVENOUS | Status: DC
  Administered 2022-05-10 – 2022-05-15 (×15): 3.375 g via INTRAVENOUS
  Filled 2022-05-10 (×15): qty 50

## 2022-05-10 MED ORDER — ACETAMINOPHEN 325 MG PO TABS: 650.0000 mg | ORAL_TABLET | Freq: Four times a day (QID) | ORAL | Status: DC | PRN

## 2022-05-10 MED ORDER — PREGABALIN 100 MG PO CAPS
300.0000 mg | ORAL_CAPSULE | Freq: Two times a day (BID) | ORAL | Status: DC
  Administered 2022-05-10 – 2022-05-15 (×10): 300 mg via ORAL
  Filled 2022-05-10 (×10): qty 3

## 2022-05-10 MED ORDER — SODIUM CHLORIDE 0.9 % IV BOLUS
1000.0000 mL | Freq: Once | INTRAVENOUS | Status: AC
  Administered 2022-05-10: 1000 mL via INTRAVENOUS

## 2022-05-10 MED ORDER — IOHEXOL 350 MG/ML SOLN
75.0000 mL | Freq: Once | INTRAVENOUS | Status: AC | PRN
  Administered 2022-05-10: 75 mL via INTRAVENOUS

## 2022-05-10 MED ORDER — FUROSEMIDE 20 MG PO TABS
20.0000 mg | ORAL_TABLET | Freq: Two times a day (BID) | ORAL | Status: DC
  Filled 2022-05-10: qty 1

## 2022-05-10 MED ORDER — SODIUM CHLORIDE 0.9 % IV SOLN: Freq: Once | INTRAVENOUS | Status: AC

## 2022-05-10 MED ORDER — DEXTROMETHORPHAN-QUINIDINE 20-10 MG PO CAPS
1.0000 | ORAL_CAPSULE | Freq: Two times a day (BID) | ORAL | Status: DC
  Administered 2022-05-10 – 2022-05-15 (×10): 1 via ORAL
  Filled 2022-05-10 (×11): qty 1

## 2022-05-10 MED ORDER — ONDANSETRON HCL 4 MG PO TABS: 4.0000 mg | ORAL_TABLET | Freq: Four times a day (QID) | ORAL | Status: DC | PRN

## 2022-05-10 MED ORDER — LOSARTAN POTASSIUM 50 MG PO TABS
25.0000 mg | ORAL_TABLET | Freq: Two times a day (BID) | ORAL | Status: DC
  Administered 2022-05-10: 25 mg via ORAL
  Filled 2022-05-10 (×2): qty 1

## 2022-05-10 MED ORDER — CARIPRAZINE HCL 1.5 MG PO CAPS
6.0000 mg | ORAL_CAPSULE | Freq: Every evening | ORAL | Status: DC
  Administered 2022-05-10 – 2022-05-14 (×5): 6 mg via ORAL
  Filled 2022-05-10 (×6): qty 4

## 2022-05-10 MED ORDER — ACETAMINOPHEN 650 MG RE SUPP: 650.0000 mg | Freq: Four times a day (QID) | RECTAL | Status: DC | PRN

## 2022-05-10 MED ORDER — LACTATED RINGERS IV SOLN: INTRAVENOUS | Status: DC

## 2022-05-10 NOTE — Assessment & Plan Note (Signed)
Pt is diet controlled at home. Start SSI. Check A1c.

## 2022-05-10 NOTE — Assessment & Plan Note (Signed)
Continue with her psych meds. Pt aware that these may need to be held if she is strict NPO.

## 2022-05-10 NOTE — ED Provider Notes (Signed)
EMERGENCY DEPARTMENT AT [facility] Provider Note   CSN: [id number] Arrival date & time: 05/10/22  1424     History  Chief Complaint  Patient presents with   Abdominal Pain    [name] is a 52 y.o. female.  Patient here with left lower quadrant abdominal pain and drainage.  Pain started a couple days ago, drainage of blood and purulent material today.  She had diverticulitis couple months ago and had several abdominal drains.  She is having pain and drainage from one of her drain sites.  This drain was pulled about 3 weeks ago.  She has been doing well until a couple days ago.  Denies any fevers or chills.  No chest pain or shortness of breath.  The history is provided by the patient.       Home Medications Prior to Admission medications   Medication Sig Start Date End Date Taking? Authorizing Provider  albuterol (VENTOLIN HFA) 108 (90 Base) MCG/ACT inhaler Inhale 2 puffs into the lungs every 6 (six) hours as needed for wheezing or shortness of breath. 10/06/20   Oretha Milch, MD  ALPRAZolam Prudy Feeler) 1 MG tablet Take 1 mg by mouth 3 (three) times daily.    [provider]  amphetamine-dextroamphetamine (ADDERALL) 20 MG tablet Take 20 mg by mouth in the morning, at noon, in the evening, and at bedtime.     [provider]  Ascorbic Acid (VITAMIN C) 500 MG tablet Take 1,000 mg by mouth daily.    [provider]  aspirin 81 MG tablet Take 1 tablet (81 mg total) by mouth daily. 05/21/19   Costella, Darci Current, PA-C  atorvastatin (LIPITOR) 20 MG tablet TAKE 1 TABLET BY MOUTH EVERY DAY IN THE EVENING Patient taking differently: Take 20 mg by mouth daily. 03/15/21   [name], MD  b complex vitamins tablet Take 1 tablet by mouth daily.    [provider]  Blood Glucose Monitoring Suppl (ACCU-CHEK AVIVA PLUS) w/Device KIT As directed. 11/14/21   [place], Betty G, MD  Calcium Citrate (CITRACAL PO) Take 2 tablets by mouth  daily.    [provider]  Cholecalciferol (VITAMIN D3) 5000 UNITS TABS Take 10,000 Units by mouth in the morning and at bedtime.    [provider]  Chromium Picolinate 500 MCG TABS Take 500 mcg by mouth daily.     [provider]  diclofenac Sodium (VOLTAREN) 1 % GEL Apply 2 g topically 3 (three) times daily as needed (knee pain).    [provider]  furosemide (LASIX) 40 MG tablet Take 2 tablets (80 mg total) by mouth 2 (two) times daily. Patient taking differently: Take 40-80 mg by mouth See admin instructions. Take 80 mg in the morning and 40 mg in the evening 12/05/21   [name], MD  [name] 1.5/30 1.5-30 MG-MCG tablet Take 1 tablet by mouth at bedtime. 08/03/19   [provider]  levalbuterol Pauline Aus HFA) 45 MCG/ACT inhaler Inhale 2 puffs into the lungs every 6 (six) hours as needed for wheezing. 10/14/20   Oretha Milch, MD  lidocaine (LIDODERM) 5 % PLACE 1 PATCH ONTO THE SKIN DAILY AS NEEDED FOR KNEE PAIN. REMOVE AND DISCARD PATCH WITHIN 12 HOURS OR AS DIRECTED BY DOCTOR. Patient taking differently: Place 1 patch onto the skin as needed (knee pain). 05/22/21   [place], Betty G, MD  losartan (COZAAR) 25 MG tablet Take 1 tablet (25 mg total) by mouth 2 (two)  times daily. 12/05/21   [name], MD  LYRICA 300 MG capsule Take 300 mg by mouth 2 (two) times daily.  03/26/11   [provider]  MAGNESIUM PO Take 500 mg by mouth daily.     [provider]  Multiple Vitamin (MULTIVITAMIN) tablet Take 1 tablet by mouth daily.    [provider]  NUEDEXTA 20-10 MG CAPS Take 1 capsule by mouth every 12 (twelve) hours.  09/27/16   [provider]  Omega-3 Fatty Acids (FISH OIL) 1200 MG CAPS Take 2,400 mg by mouth daily.     [provider]  potassium chloride SA (KLOR-CON M20) 20 MEQ tablet Take 1 tablet (20 mEq total) by mouth daily. 04/14/21   [name], MD  Sodium Chloride Flush (NORMAL SALINE FLUSH) 0.9  % SOLN Use to flush drain 1-2 times daily. 02/16/22     VRAYLAR 6 MG CAPS Take 6 mg by mouth every evening.  09/03/16   [provider]  Zinc 50 MG CAPS Take 50 mg by mouth in the morning and at bedtime.    [provider]      Allergies    Tery Sanfilippo hcl]    Review of Systems   Review of Systems  Physical Exam Updated Vital Signs BP 96/71 (BP Location: Right Arm)   Pulse 70   Temp 97.9 F (36.6 C) (Oral)   Resp 16   Ht  (1.651 m)   Wt 122 kg   SpO2 97%   BMI 44.76 kg/m  Physical Exam Vitals and nursing note reviewed.  Constitutional:      General: She is not in acute distress.    Appearance: She is well-developed. She is not ill-appearing.  HENT:     Head: Normocephalic and atraumatic.  Eyes:     Extraocular Movements: Extraocular movements intact.     Conjunctiva/sclera: Conjunctivae normal.  Cardiovascular:     Rate and Rhythm: Normal rate and regular rhythm.     Heart sounds: No murmur heard. Pulmonary:     Effort: Pulmonary effort is normal. No respiratory distress.     Breath sounds: Normal breath sounds.  Abdominal:     Palpations: Abdomen is soft.     Tenderness: There is abdominal tenderness in the left lower quadrant.     Comments: Blood draining from drains on the left lower abdomen, foul-smelling no obvious purulence  Musculoskeletal:        General: No swelling.     Cervical back: Neck supple.  Skin:    General: Skin is warm and dry.     Capillary Refill: Capillary refill takes less than 2 seconds.  Neurological:     General: No focal deficit present.     Mental Status: She is alert.  Psychiatric:        Mood and Affect: Mood normal.     ED Results / Procedures / Treatments   Labs (all labs ordered are listed, but only abnormal results are displayed) Labs Reviewed  CBC WITH DIFFERENTIAL/PLATELET - Abnormal; Notable for the following components:      Result Value   RBC 5.21 (*)    Neutro Abs 7.9 (*)    All  other components within normal limits  COMPREHENSIVE METABOLIC PANEL - Abnormal; Notable for the following components:   Glucose, Bld 208 (*)    Albumin 2.7 (*)    All other components within normal limits  LIPASE, BLOOD  URINALYSIS, ROUTINE W REFLEX MICROSCOPIC  EKG None  Radiology CT ABDOMEN PELVIS W CONTRAST  Result Date: 05/10/2022 CLINICAL DATA:  History of diverticulitis complicated by postoperative fluid, status post drainage placement. Now presenting with inadvertent drain removal. EXAM: CT ABDOMEN AND PELVIS WITH CONTRAST TECHNIQUE: Multidetector CT imaging of the abdomen and pelvis was performed using the standard protocol following bolus administration of intravenous contrast. RADIATION DOSE REDUCTION: This exam was performed according to the departmental dose-optimization program which includes automated exposure control, adjustment of the mA and/or kV according to patient size and/or use of iterative reconstruction technique. CONTRAST:  45mL OMNIPAQUE IOHEXOL 350 MG/ML SOLN COMPARISON:  CT abdomen and pelvis dated 01/01/2022 FINDINGS: Lower chest: Scattered left lower lobe cysts, unchanged. No pleural effusion or pneumothorax demonstrated. Partially imaged heart size is normal. Coronary artery calcifications. Hepatobiliary: No focal hepatic lesions. No intra or extrahepatic biliary ductal dilation. Cholelithiasis. Pancreas: No focal lesions or main ductal dilation. Spleen: Normal in size without focal abnormality. Adrenals/Urinary Tract: No adrenal nodules. No suspicious renal mass, calculi or hydronephrosis. No focal bladder wall thickening. Stomach/Bowel: Normal appearance of the stomach. Mural thickening of the sigmoid colon with fistulization to midline abdominal collections as below. Normal appendix. Vascular/Lymphatic: Aortic atherosclerosis. No enlarged abdominal or pelvic lymph nodes. Reproductive: No adnexal masses. 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 An irregular collection in communication with this collection posteriorly extends into the rectouterine pouch, measuring 6.8 x 3.2 cm (3:72), previously 3.5 x 3.5 cm. Musculoskeletal: No acute or abnormal lytic or blastic osseous lesions. Multilevel degenerative changes of the partially imaged thoracic and lumbar spine. Subcutaneous stranding and emphysema along the left anterior lower abdominal wall, where a drainage catheter was seen previously. Interval development of a gas-containing fluid collection along this tract within the left abdominal musculature measuring 2.8 x 2.0 cm (3:67). IMPRESSION: 1. 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 An irregular collection in communication posteriorly extends into the rectouterine pouch, measuring 6.8 x 3.2 cm, previously 3.5 x 3.5 cm. 2. Subcutaneous stranding and emphysema along the left anterior lower abdominal wall, where a drainage catheter was seen previously. Interval development of a gas-containing fluid collection along this tract within the left abdominal musculature measuring 2.8 x 2.0 cm. 3. Aortic Atherosclerosis (ICD10-I70.0). Electronically Signed   By: Agustin Cree M.D.   On: 05/10/2022 18:52    Procedures Procedures    Medications Ordered in ED Medications  fentaNYL (SUBLIMAZE) injection 50 mcg (has no administration in time range)  fentaNYL (SUBLIMAZE) injection 50 mcg (50 mcg Intravenous Given 05/10/22 1550)  sodium chloride 0.9 % bolus 1,000 mL (0 mLs Intravenous Stopped 05/10/22 1933)   iohexol (OMNIPAQUE) 350 MG/ML injection 75 mL (75 mLs Intravenous Contrast Given 05/10/22 1837)  0.9 %  sodium chloride infusion ( Intravenous New Bag/Given 05/10/22 1932)    ED Course/ Medical Decision Making/ A&P                             Medical Decision Making Amount and/or Complexity of Data Reviewed Labs: ordered. Radiology: ordered.  Risk Prescription drug management. Decision regarding hospitalization.  Malaka L Ismael is here with abdominal pain.  History of diabetes.  History of diverticulitis.  Patient having pain in the left lower quadrant with drainage of blood and purulence from her prior diverticulitis drain site.  She is having abdominal pain 3 days ago.  No drainage today.  Differential diagnosis possibly diverticulitis again with another abscess or complication.  Could just be a reaccumulated abscess as well.  Seems less likely to be bowel obstruction.  Will get CBC, CMP, lipase, CT scan abdomen and pelvis.  Per my review and interpretation labs no significant leukocytosis or anemia or electrolyte abnormality or kidney injury.  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  Talked with Dr. Derrell Lollingamirez of general surgery.  He recommends starting IV antibiotics and have patient admitted to medicine.  He will come evaluate the patient.    This chart was dictated using voice recognition software.  Despite best efforts to proofread,  errors can occur which can change the documentation meaning.         Final Clinical Impression(s) / ED Diagnoses Final diagnoses:  Abscess  Diverticulitis    Rx / DC Orders ED Discharge Orders     None         Virgina NorfolkCuratolo, Dylyn Mclaren, DO 05/10/22 1950

## 2022-05-10 NOTE — Assessment & Plan Note (Signed)
Stable  - Continue with CPAP

## 2022-05-10 NOTE — H&P (Signed)
History and Physical    [name] EXH:[id number] DOB: 1970-09-10 DOA: 05/10/2022  DOS: the patient was seen and examined on 05/10/2022  PCP: [place], Betty G, MD   Patient coming from: Home  I have personally briefly reviewed patient's old medical records in [facility]  CC: abd pain, abd drainage. HPI: 52 year old female history of morbid obesity BMI 44.7, type 2 diabetes diet-controlled, hypertension, OSA on CPAP, bipolar disorder, history of perforated diverticulitis status post multiple drains in November 2023 presents to the ER today with increasing abdominal pain and drainage for the last 3 to 5 days.  Patient states that she had her last intra-abdominal drain pulled at the end of March under the direction of general surgery.  She states that she has been doing well since then.  Over the last 3 to 5 days, she has noted increasing drainage from her prior drainage puncture site.  She has had some chills but no documented fever.  She made a call to her general surgeon for follow-up appointment but states that her abdominal pain grew worse today and presented to the ER.  On arrival temp 97.9 heart rate 82 blood pressure 106/70 satting 97% on room air.  Labs showed a white count of 9.8, hemoglobin 15, platelets of 381  Sodium 136, potassium 3.6, bicarb of 23, BUN of 15, creatinine 0.77, serum glucose of 208  Lipase of 27  CT abdomen pelvis showed midline lower abdominal gas containing fluid collection measuring 7.5 x 4.3.  This was previously 2.2 x 2.2.  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  There is also another 6.8 x 3.2 fluid collection that communicates posteriorly and extends into the rectouterine pouch.  There is also a gas containing fluid collection in the abdominal musculature measuring 2.8 x 2.0 cm.  EDP discussed the case with Dr. Derrell Lolling with general surgery.  Consult was requested for general surgery see the patient.  [facility] consulted for admission.   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 Negative for fever.  HENT: Negative.    Eyes: Negative.   Respiratory: Negative.    Cardiovascular: Negative.   Gastrointestinal:  Positive for abdominal pain.  Genitourinary: Negative.   Musculoskeletal: Negative.   Skin:        Bloody drainage from prior abd drain insertion site  Neurological: Negative.   Endo/Heme/Allergies: Negative.   Psychiatric/Behavioral: Negative.    All other systems reviewed and are negative.   Past Medical History:  Diagnosis Date   Acute systolic congestive heart failure    Agoraphobia    Anxiety    panic attacks   Arthritis    osteoarthritis bilal. knees   Asthma    no per PFT 7/13; reports does not have asthma   Bipolar disorder    Carpal tunnel syndrome of left wrist 05/2011   Being evaluated for MS   Cor pulmonale    Depression    History of pleural effusion    Hyperlipidemia    Hypertension    Hypoxemia    history of - no home O2   IBS (irritable bowel syndrome)    Morbid obesity    Obesity hypoventilation syndrome    Perforation of sigmoid colon due to diverticulitis 12/13/2021   Pneumonia    Pre-diabetes    Prediabetes    Seasonal allergies    current  runny nose   Seizures    febrile seizure x 1 as a child. Several times   Sleep apnea sleep study 07/02/2010   uses CPAP nightly   Spinal stenosis in cervical region    Urinary incontinence     Past Surgical History:  Procedure Laterality Date   APPLICATION OF INTRAOPERATIVE CT SCAN N/A 05/14/2019   Procedure: APPLICATION OF INTRAOPERATIVE CT SCAN;  Surgeon: Maeola Harman, MD;  Location: [facility] OR;  Service: Neurosurgery;  Laterality: N/A;   CARPAL TUNNEL RELEASE  06/27/2011   Procedure: CARPAL TUNNEL RELEASE;  Surgeon: Nicki Reaper, MD;  Location: [facility];  Service: Orthopedics;  Laterality: Left;   CARPAL TUNNEL RELEASE  12/19/2011   Procedure: CARPAL TUNNEL RELEASE;  Surgeon: Nicki Reaper, MD;  Location: [facility];  Service: Orthopedics;  Laterality: Right;   INCISION AND DRAINAGE ABSCESS Left 08/27/2017   Procedure: INCISION AND DRAINAGE LEFT BUTTOCK  ABSCESS;  Surgeon: Glenna Fellows, MD;  Location: [facility];  Service: General;  Laterality: Left;   IR RADIOLOGIST EVAL & MGMT  03/16/2022   IR RADIOLOGIST EVAL & MGMT  04/18/2022   IR SINUS/FIST TUBE CHK-NON GI  01/03/2022   IR SINUS/FIST TUBE CHK-NON GI  01/03/2022   IR SINUS/FIST TUBE CHK-NON GI  01/03/2022   IR SINUS/FIST TUBE CHK-NON GI  01/03/2022   IRRIGATION AND DEBRIDEMENT BUTTOCKS Left 08/30/2017   Procedure: IRRIGATION AND DEBRIDEMENT RE-EXCISION OF LEFT SUBCUTANEOUS BUTTOCKS ABCESS;  Surgeon: Rodman Pickle, MD;  Location: [facility];  Service: General;  Laterality: Left;   LAPAROSCOPY N/A 12/11/2021   Procedure: LAPAROSCOPIC PERITONEAL LAVAGE WITH DRAIN PLACEMENT;  Surgeon: Quentin Ore, MD;  Location: [facility] OR;  Service: General;  Laterality: N/A;   MASS EXCISION  08/07/2010   right index   POSTERIOR CERVICAL FUSION/FORAMINOTOMY  09/11/2011   Procedure: POSTERIOR CERVICAL FUSION/FORAMINOTOMY LEVEL 1;  Surgeon: Maeola Harman, MD;  Location: [facility];  Service: Neurosurgery;  Laterality: N/A;  Cervical Three-Four Posteior Cervical Fusion and Decompression.   POSTERIOR CERVICAL FUSION/FORAMINOTOMY N/A 05/14/2019   Procedure: Posterior cervical decompression/fusion Cervical four to Thoracic one with exploration/revision of Cervical threee-four fusion;  Surgeon: Maeola Harman, MD;  Location: [facility] OR;  Service: Neurosurgery;  Laterality: N/A;   TRIGGER FINGER RELEASE  12/19/2011   Procedure: RELEASE TRIGGER FINGER/A-1 PULLEY;  Surgeon: Nicki Reaper, MD;  Location: [facility];  Service: Orthopedics;  Laterality: Right;   TRIGGER FINGER RELEASE Left  12/31/2012   Procedure: RELEASE A-1 PULLEY LEFT THUMB;  Surgeon: Nicki Reaper, MD;  Location: [facility];  Service: Orthopedics;  Laterality: Left;   WISDOM TOOTH EXTRACTION       reports that she has never smoked. She has never used smokeless tobacco. She reports current alcohol use. She reports that she does not use drugs.  Allergies  Allergen Reactions   Savella [Milnacipran Hcl] Other (See Comments)    mania    Family History  Problem Relation Age of Onset   Asthma Mother    Allergic rhinitis Mother    Multiple sclerosis Mother    Asthma Maternal Grandmother    Breast cancer Neg Hx     Prior to Admission medications   Medication Sig Start Date End Date Taking? Authorizing Provider  albuterol (VENTOLIN HFA) 108 (90 Base) MCG/ACT inhaler Inhale 2 puffs into the lungs every 6 (six) hours as needed for wheezing or shortness of breath. 10/06/20   Oretha Milch, MD  ALPRAZolam (XANAX) 1 MG tablet Take 1 mg by mouth 3 (three) times daily.    [provider]  amphetamine-dextroamphetamine (ADDERALL) 20 MG tablet Take 20 mg by mouth in the morning, at noon, in the evening, and at bedtime.     [provider]  Ascorbic Acid (VITAMIN C) 500 MG tablet Take 1,000 mg by mouth daily.    [provider]  aspirin 81 MG tablet Take 1 tablet (81 mg total) by mouth daily. 05/21/19   Costella, Darci CurrentVincent J, PA-C  atorvastatin (LIPITOR) 20 MG tablet TAKE 1 TABLET BY MOUTH EVERY DAY IN THE EVENING Patient taking differently: Take 20 mg by mouth daily. 03/15/21   Rollene RotundaHochrein, James, MD  b complex vitamins tablet Take 1 tablet by mouth daily.    [provider]  Blood Glucose Monitoring Suppl (ACCU-CHEK AVIVA PLUS) w/Device KIT As directed. 11/14/21   [place], Betty G, MD  Calcium Citrate (CITRACAL PO) Take 2 tablets by mouth daily.    [provider]  Cholecalciferol (VITAMIN D3) 5000 UNITS TABS Take 10,000 Units by mouth in the morning and at bedtime.     [provider]  Chromium Picolinate 500 MCG TABS Take 500 mcg by mouth daily.     [provider]  diclofenac Sodium (VOLTAREN) 1 % GEL Apply 2 g topically 3 (three) times daily as needed (knee pain).    [provider]  furosemide (LASIX) 40 MG tablet Take 2 tablets (80 mg total) by mouth 2 (two) times daily. Patient taking differently: Take 40-80 mg by mouth See admin instructions. Take 80 mg in the morning and 40 mg in the evening 12/05/21   Rollene RotundaHochrein, James, MD  [name] 1.5/30 1.5-30 MG-MCG tablet Take 1 tablet by mouth at bedtime. 08/03/19   [provider]  levalbuterol Pauline Aus(XOPENEX HFA) 45 MCG/ACT inhaler Inhale 2 puffs into the lungs every 6 (six) hours as needed for wheezing. 10/14/20   Oretha MilchAlva, Rakesh V, MD  lidocaine (LIDODERM) 5 % PLACE 1 PATCH ONTO THE SKIN DAILY AS NEEDED FOR KNEE PAIN. REMOVE AND DISCARD PATCH WITHIN 12 HOURS OR AS DIRECTED BY DOCTOR. Patient taking differently: Place 1 patch onto the skin as needed (knee pain). 05/22/21   [place], Betty G, MD  losartan (COZAAR) 25 MG tablet Take 1 tablet (25 mg total) by mouth 2 (two) times daily. 12/05/21   Rollene RotundaHochrein, James, MD  LYRICA 300 MG capsule Take 300 mg by mouth 2 (two) times daily.  03/26/11   [provider]  MAGNESIUM PO Take 500 mg by mouth daily.     [provider]  Multiple Vitamin (MULTIVITAMIN) tablet Take 1 tablet by mouth daily.    [provider]  NUEDEXTA 20-10 MG CAPS Take 1 capsule by mouth every 12 (twelve) hours.  09/27/16   [provider]  Omega-3 Fatty Acids (FISH OIL) 1200 MG CAPS Take 2,400 mg by mouth daily.     [provider]  potassium chloride SA (KLOR-CON M20) 20 MEQ tablet Take 1 tablet (20 mEq total) by mouth daily. 04/14/21   Rollene RotundaHochrein, James, MD  Sodium Chloride Flush (NORMAL SALINE FLUSH) 0.9 % SOLN Use to flush drain 1-2 times daily. 02/16/22     VRAYLAR 6 MG CAPS Take 6 mg by mouth every evening.  09/03/16   [provider]  Zinc 50 MG CAPS Take 50 mg by mouth in the morning and at bedtime.    [provider]    Physical Exam: Vitals:  05/10/22 1600 05/10/22 1615 05/10/22 1645 05/10/22 1745  BP: (!) 109/47 (!) 113/94 109/66 96/71  Pulse: 71 72 68 70  Resp: 16   16  Temp:      TempSrc:      SpO2: 96% 100% 98% 97%  Weight:      Height:        Physical Exam Vitals and nursing note reviewed.  Constitutional:      General: She is not in acute distress.    Appearance: She is obese. She is not ill-appearing, toxic-appearing or diaphoretic.  HENT:     Head: Normocephalic and atraumatic.     Nose: Nose normal.  Cardiovascular:     Rate and Rhythm: Normal rate and regular rhythm.     Pulses: Normal pulses.  Pulmonary:     Effort: Pulmonary effort is normal.     Breath sounds: Normal breath sounds.  Abdominal:     General: Abdomen is protuberant.     Tenderness: There is abdominal tenderness in the right lower quadrant.  Musculoskeletal:     Right lower leg: No edema.     Left lower leg: No edema.  Skin:    General: Skin is warm and dry.     Capillary Refill: Capillary refill takes less than 2 seconds.     Comments: Facial hirsutism  Neurological:     General: No focal deficit present.     Mental Status: She is alert and oriented to person, place, and time.      Labs on Admission: I have personally reviewed following labs and imaging studies  CBC: Recent Labs  Lab 05/10/22 1448  WBC 9.8  NEUTROABS 7.9*  HGB 15.0  HCT 45.7  MCV 87.7  PLT 381   Basic Metabolic Panel: Recent Labs  Lab 05/10/22 1448  NA 136  K 3.6  CL 99  CO2 23  GLUCOSE 208*  BUN 15  CREATININE 0.77  CALCIUM 9.5   GFR: Estimated Creatinine Clearance: 107.8 mL/min (by C-G formula based on SCr of 0.77 mg/dL). Liver Function Tests: Recent Labs  Lab 05/10/22 1448  AST 29  ALT 27  ALKPHOS 95  BILITOT 0.9  PROT 6.8  ALBUMIN 2.7*   Recent Labs  Lab 05/10/22 1448  LIPASE 27   Radiological  Exams on Admission: I have personally reviewed images CT ABDOMEN PELVIS W CONTRAST  Result Date: 05/10/2022 CLINICAL DATA:  History of diverticulitis complicated by postoperative fluid, status post drainage placement. Now presenting with inadvertent drain removal. EXAM: CT ABDOMEN AND PELVIS WITH CONTRAST TECHNIQUE: Multidetector CT imaging of the abdomen and pelvis was performed using the standard protocol following bolus administration of intravenous contrast. RADIATION DOSE REDUCTION: This exam was performed according to the departmental dose-optimization program which includes automated exposure control, adjustment of the mA and/or kV according to patient size and/or use of iterative reconstruction technique. CONTRAST:  64mL OMNIPAQUE IOHEXOL 350 MG/ML SOLN COMPARISON:  CT abdomen and pelvis dated 01/01/2022 FINDINGS: Lower chest: Scattered left lower lobe cysts, unchanged. No pleural effusion or pneumothorax demonstrated. Partially imaged heart size is normal. Coronary artery calcifications. Hepatobiliary: No focal hepatic lesions. No intra or extrahepatic biliary ductal dilation. Cholelithiasis. Pancreas: No focal lesions or main ductal dilation. Spleen: Normal in size without focal abnormality. Adrenals/Urinary Tract: No adrenal nodules. No suspicious renal mass, calculi or hydronephrosis. No focal bladder wall thickening. Stomach/Bowel: Normal appearance of the stomach. Mural thickening of the sigmoid colon with fistulization to midline abdominal collections as below. Normal appendix. Vascular/Lymphatic:  Aortic atherosclerosis. No enlarged abdominal or pelvic lymph nodes. Reproductive: No adnexal masses. Other: Previously noted drainage catheters have been removed, including left lower quadrant approach drain with increased size of gas-containing midline lower abdomen irregular collection measuring 7.5 x 4.3 cm (3:73), previously 2.2 x 2.2 cm, which demonstrates fistulous connection with the sigmoid  colon. An irregular collection in communication with this collection posteriorly extends into the rectouterine pouch, measuring 6.8 x 3.2 cm (3:72), previously 3.5 x 3.5 cm. Musculoskeletal: No acute or abnormal lytic or blastic osseous lesions. Multilevel degenerative changes of the partially imaged thoracic and lumbar spine. Subcutaneous stranding and emphysema along the left anterior lower abdominal wall, where a drainage catheter was seen previously. Interval development of a gas-containing fluid collection along this tract within the left abdominal musculature measuring 2.8 x 2.0 cm (3:67). IMPRESSION: 1. 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 An irregular collection in communication posteriorly extends into the rectouterine pouch, measuring 6.8 x 3.2 cm, previously 3.5 x 3.5 cm. 2. Subcutaneous stranding and emphysema along the left anterior lower abdominal wall, where a drainage catheter was seen previously. Interval development of a gas-containing fluid collection along this tract within the left abdominal musculature measuring 2.8 x 2.0 cm. 3. Aortic Atherosclerosis (ICD10-I70.0). Electronically Signed   By: Agustin Cree M.D.   On: 05/10/2022 18:52    EKG: My personal interpretation of EKG shows: no EKG to review  Assessment/Plan Principal Problem:   Intra-abdominal abscess Active Problems:   Morbid obesity   Bipolar disorder   Type 2 diabetes mellitus with other specified complication   OSA on CPAP   Essential hypertension, benign   Chronic combined systolic and diastolic CHF (congestive heart failure)    Assessment and Plan: * Intra-abdominal abscess Admit to med/surg bed. EDP has consulted general surgery(Ramirez). Continue with IV Zosyn. Pt with sigmoid fistula  formation and has had multiple drains in the past. Last drain was removed end of march 2024. Discussed with pt that she may indeed need surgery this time for sigmoid colectomy but ultimately this would be at the discretion of the surgeon. Keep NPO. IVF. Do not think colonoscopy with pt having active fistula is appropriate at this time.  Chronic combined systolic and diastolic CHF (congestive heart failure) Continue with lasix 20 mg bid. Pt states this is her home dose.  Essential hypertension, benign Stable.continue cozaar for now.  OSA on CPAP Stable. Continue with CPAP.  Type 2 diabetes mellitus with other specified complication Pt is diet controlled at home. Start SSI. Check A1c.  Bipolar disorder Continue with her psych meds. Pt aware that these may need to be held if she is strict NPO.  Morbid obesity Chronic. BMI 44.7   DVT prophylaxis: SQ Heparin Code Status: Full Code Family Communication: discussed with pt, pt's brother(doug) and pt's sister-in-law(loretta) Disposition Plan: return home  Consults called: EDP has consulted Dr. Derrell Lolling with general surgery  Admission status: Inpatient, Med-Surg   Carollee Herter, DO [facility] 05/10/2022, 8:16 PM

## 2022-05-10 NOTE — Assessment & Plan Note (Signed)
Chronic. BMI 44.7

## 2022-05-10 NOTE — ED Triage Notes (Signed)
Pt to the ed from home with a CC of drain rapture from left lower abd. Pt had surgery in nov for diverticulitis. Pt had drain placed in march. Pt was getting out of bed this morning when she turned incorrectly and the drain came out. Pt denies fever, chills, sob, cp, or any other s/s at this time.

## 2022-05-10 NOTE — Subjective & Objective (Signed)
CC: abd pain, abd drainage. HPI: 52 year old female history of morbid obesity BMI 44.7, type 2 diabetes diet-controlled, hypertension, OSA on CPAP, bipolar disorder, history of perforated diverticulitis status post multiple drains in November 2023 presents to the ER today with increasing abdominal pain and drainage for the last 3 to 5 days.  Patient states that she had her last intra-abdominal drain pulled at the end of March under the direction of general surgery.  She states that she has been doing well since then.  Over the last 3 to 5 days, she has noted increasing drainage from her prior drainage puncture site.  She has had some chills but no documented fever.  She made a call to her general surgeon for follow-up appointment but states that her abdominal pain grew worse today and presented to the ER.  On arrival temp 97.9 heart rate 82 blood pressure 106/70 satting 97% on room air.  Labs showed a white count of 9.8, hemoglobin 15, platelets of 381  Sodium 136, potassium 3.6, bicarb of 23, BUN of 15, creatinine 0.77, serum glucose of 208  Lipase of 27  CT abdomen pelvis showed midline lower abdominal gas containing fluid collection measuring 7.5 x 4.3.  This was previously 2.2 x 2.2.  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  There is also another 6.8 x 3.2 fluid collection that communicates posteriorly and extends into the rectouterine pouch.  There is also a gas containing fluid collection in the abdominal musculature measuring 2.8 x 2.0 cm.  EDP discussed the case with Dr. Derrell Lolling with general surgery.  Consult was requested for general surgery see the patient.  [facility] hospitalist consulted for admission.

## 2022-05-10 NOTE — Plan of Care (Signed)

## 2022-05-10 NOTE — Progress Notes (Signed)
Pharmacy Antibiotic Note  [name] is a 52 y.o. female admitted on 05/10/2022 with  intra-abdominal infection .  Pharmacy has been consulted for Zosyn dosing.  Patient had diverticulitis a couple of months ago with abdominal drain placed at that time. Her drain was pulled about 3 weeks ago and she reports having drainage and pain from that site (started a couple of days ago).   Plan: Start Zosyn 3.375g IV q8h (4 hour infusion). Monitor renal function and overall clinical picture  F/u CT scan abdomen/pelvis    Height: 5\' 5"  (165.1 cm) Weight: 122 kg (268 lb 15.4 oz) IBW/kg (Calculated) : 57  Temp (24hrs), Avg:97.9 F (36.6 C), Min:97.9 F (36.6 C), Max:97.9 F (36.6 C)  Recent Labs  Lab 05/10/22 1448  WBC 9.8  CREATININE 0.77    Estimated Creatinine Clearance: 107.8 mL/min (by C-G formula based on SCr of 0.77 mg/dL).    Allergies  Allergen Reactions   Savella [Milnacipran Hcl] Other (See Comments)    mania    Antimicrobials this admission: 4/11 Zosyn >>    Microbiology results: N/A    Thank you for allowing pharmacy to be a part of this patient's care.  Cherylin Mylar, PharmD PGY1 Pharmacy Resident 4/11/20248:10 PM

## 2022-05-10 NOTE — Assessment & Plan Note (Addendum)
Admit to med/surg bed. EDP has consulted general surgery(Ramirez). Continue with IV Zosyn. 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 Last drain was removed end of march 2024. Discussed with pt that she may indeed need surgery this time for sigmoid colectomy but ultimately this would be at the discretion of the surgeon. Keep NPO. IVF. Do not think colonoscopy with pt having active fistula is appropriate at this time.

## 2022-05-10 NOTE — ED Notes (Signed)
ED TO INPATIENT HANDOFF REPORT  ED Nurse Name and Phone #: Pennie Rushing A. Francisca Harbuck, RN [phone]  S Name/Age/Gender [name] 52 y.o. female Room/Bed: 025C/025C  Code Status   Code Status: Full Code  Home/SNF/Other Home Patient oriented to: self, place, time, and situation Is this baseline? Yes   Triage Complete: Triage complete  Chief Complaint Intra-abdominal abscess [K65.1]  Triage Note Pt to the ed from home with a CC of drain rapture from left lower abd. Pt had surgery in nov for diverticulitis. Pt had drain placed in march. Pt was getting out of bed this morning when she turned incorrectly and the drain came out. Pt denies fever, chills, sob, cp, or any other s/s at this time.    Allergies Allergies  Allergen Reactions   Savella [Milnacipran Hcl] Other (See Comments)    mania    Level of Care/Admitting Diagnosis ED Disposition     ED Disposition  Admit   Condition  --   Comment  Hospital [facility]: [facility] [[id number]]  Level of Care: Med-Surg [16]  May admit patient to [facility] or [facility] if equivalent level of care is available:: No  Covid Evaluation: Asymptomatic - no recent exposure (last 10 days) testing not required  Diagnosis: Intra-abdominal abscess [301066]  Admitting Physician: Imogene Burn, ERIC [[id number]]  Attending Physician: Imogene Burn, ERIC [[id number]]  Certification:: I certify this patient will need inpatient services for at least 2 midnights  Estimated Length of Stay: 5          B Medical/Surgery History Past Medical History:  Diagnosis Date   Acute systolic congestive heart failure    Agoraphobia    Anxiety    panic attacks   Arthritis    osteoarthritis bilal. knees   Asthma    no per PFT 7/13; reports does not have asthma   Bipolar disorder    Carpal tunnel syndrome of left wrist 05/2011   Being evaluated for MS   Cor pulmonale    Depression    History of pleural effusion    Hyperlipidemia    Hypertension    Hypoxemia     history of - no home O2   IBS (irritable bowel syndrome)    Morbid obesity    Obesity hypoventilation syndrome    Perforation of sigmoid colon due to diverticulitis 12/13/2021   Pneumonia    Pre-diabetes    Prediabetes    Seasonal allergies    current runny nose   Seizures    febrile seizure x 1 as a child. Several times   Sleep apnea sleep study 07/02/2010   uses CPAP nightly   Spinal stenosis in cervical region    Urinary incontinence    Past Surgical History:  Procedure Laterality Date   APPLICATION OF INTRAOPERATIVE CT SCAN N/A 05/14/2019   Procedure: APPLICATION OF INTRAOPERATIVE CT SCAN;  Surgeon: Maeola Harman, MD;  Location: [facility] OR;  Service: Neurosurgery;  Laterality: N/A;   CARPAL TUNNEL RELEASE  06/27/2011   Procedure: CARPAL TUNNEL RELEASE;  Surgeon: Nicki Reaper, MD;  Location: [facility];  Service: Orthopedics;  Laterality: Left;   CARPAL TUNNEL RELEASE  12/19/2011   Procedure: CARPAL TUNNEL RELEASE;  Surgeon: Nicki Reaper, MD;  Location: [facility];  Service: Orthopedics;  Laterality: Right;   INCISION AND DRAINAGE ABSCESS Left 08/27/2017   Procedure: INCISION AND DRAINAGE LEFT BUTTOCK  ABSCESS;  Surgeon: Glenna Fellows, MD;  Location: [facility];  Service: General;  Laterality: Left;  IR RADIOLOGIST EVAL & MGMT  03/16/2022   IR RADIOLOGIST EVAL & MGMT  04/18/2022   IR SINUS/FIST TUBE CHK-NON GI  01/03/2022   IR SINUS/FIST TUBE CHK-NON GI  01/03/2022   IR SINUS/FIST TUBE CHK-NON GI  01/03/2022   IR SINUS/FIST TUBE CHK-NON GI  01/03/2022   IRRIGATION AND DEBRIDEMENT BUTTOCKS Left 08/30/2017   Procedure: IRRIGATION AND DEBRIDEMENT RE-EXCISION OF LEFT SUBCUTANEOUS BUTTOCKS ABCESS;  Surgeon: Rodman Pickle, MD;  Location: [facility];  Service: General;  Laterality: Left;   LAPAROSCOPY N/A 12/11/2021   Procedure: LAPAROSCOPIC PERITONEAL LAVAGE WITH DRAIN PLACEMENT;  Surgeon: Quentin Ore, MD;  Location: [facility] OR;  Service: General;  Laterality:  N/A;   MASS EXCISION  08/07/2010   right index   POSTERIOR CERVICAL FUSION/FORAMINOTOMY  09/11/2011   Procedure: POSTERIOR CERVICAL FUSION/FORAMINOTOMY LEVEL 1;  Surgeon: Maeola Harman, MD;  Location: [facility];  Service: Neurosurgery;  Laterality: N/A;  Cervical Three-Four Posteior Cervical Fusion and Decompression.   POSTERIOR CERVICAL FUSION/FORAMINOTOMY N/A 05/14/2019   Procedure: Posterior cervical decompression/fusion Cervical four to Thoracic one with exploration/revision of Cervical threee-four fusion;  Surgeon: Maeola Harman, MD;  [facility];  Service: Neurosurgery;  Laterality: N/A;   TRIGGER FINGER RELEASE  12/19/2011   Procedure: RELEASE TRIGGER FINGER/A-1 PULLEY;  Surgeon: Nicki Reaper, MD;  Location: [facility];  Service: Orthopedics;  Laterality: Right;   TRIGGER FINGER RELEASE Left 12/31/2012   Procedure: RELEASE A-1 PULLEY LEFT THUMB;  Surgeon: Nicki Reaper, MD;  Location: [facility];  Service: Orthopedics;  Laterality: Left;   WISDOM TOOTH EXTRACTION       A IV Location/Drains/Wounds Patient Lines/Drains/Airways Status     Active Line/Drains/Airways     Name Placement date Placement time Site Days   Peripheral IV 05/10/22 20 G Anterior;Left;Proximal Forearm 05/10/22  1550  Forearm  less than 1   Closed System Drain 2 Left;Anterior LLQ Other (Comment) 12/19/21  1635  LLQ  142   Incision - 4 Ports Abdomen Right;Lateral Right;Medial Left;Medial Left;Lateral 12/11/21  --  -- 150            Intake/Output Last 24 hours No intake or output data in the 24 hours ending 05/10/22 2014  Labs/Imaging Results for orders placed or performed during the hospital encounter of 05/10/22 (from the past 48 hour(s))  CBC with Differential     Status: Abnormal   Collection Time: 05/10/22  2:48 PM  Result Value Ref Range   WBC 9.8 4.0 - 10.5 K/uL   RBC 5.21 (H) 3.87 - 5.11 MIL/uL   Hemoglobin 15.0 12.0 - 15.0 g/dL   HCT 24.0 97.3 - 53.2 %   MCV 87.7  80.0 - 100.0 fL   MCH 28.8 26.0 - 34.0 pg   MCHC 32.8 30.0 - 36.0 g/dL   RDW 99.2 42.6 - 83.4 %   Platelets 381 150 - 400 K/uL   nRBC 0.0 0.0 - 0.2 %   Neutrophils Relative % 79 %   Neutro Abs 7.9 (H) 1.7 - 7.7 K/uL   Lymphocytes Relative 13 %   Lymphs Abs 1.2 0.7 - 4.0 K/uL   Monocytes Relative 6 %   Monocytes Absolute 0.6 0.1 - 1.0 K/uL   Eosinophils Relative 1 %   Eosinophils Absolute 0.1 0.0 - 0.5 K/uL   Basophils Relative 0 %   Basophils Absolute 0.0 0.0 - 0.1 K/uL   Immature Granulocytes 1 %   Abs Immature Granulocytes 0.05 0.00 -  0.07 K/uL    Comment: Performed at [facility], [address] [place], [place] [address]  Comprehensive metabolic panel     Status: Abnormal   Collection Time: 05/10/22  2:48 PM  Result Value Ref Range   Sodium 136 135 - 145 mmol/L   Potassium 3.6 3.5 - 5.1 mmol/L   Chloride 99 98 - 111 mmol/L   CO2 23 22 - 32 mmol/L   Glucose, Bld 208 (H) 70 - 99 mg/dL    Comment: Glucose reference range applies only to samples taken after fasting for at least 8 hours.   BUN 15 6 - 20 mg/dL   Creatinine, Ser 8.290.77 0.44 - 1.00 mg/dL   Calcium 9.5 8.9 - 56.210.3 mg/dL   Total Protein 6.8 6.5 - 8.1 g/dL   Albumin 2.7 (L) 3.5 - 5.0 g/dL   AST 29 15 - 41 U/L   ALT 27 0 - 44 U/L   Alkaline Phosphatase 95 38 - 126 U/L   Total Bilirubin 0.9 0.3 - 1.2 mg/dL   GFR, Estimated >13>60 >08>60 mL/min    Comment: (NOTE) Calculated using the CKD-EPI Creatinine Equation (2021)    Anion gap 14 5 - 15    Comment: Performed at [facility], [address] [place], [place] [address]  Lipase, blood     Status: None   Collection Time: 05/10/22  2:48 PM  Result Value Ref Range   Lipase 27 11 - 51 U/L    Comment: Performed at [facility], [address] [place], [place] [address]   CT ABDOMEN PELVIS W CONTRAST  Result Date: 05/10/2022 CLINICAL DATA:  History of diverticulitis complicated by postoperative fluid, status post drainage placement. Now presenting with  inadvertent drain removal. EXAM: CT ABDOMEN AND PELVIS WITH CONTRAST TECHNIQUE: Multidetector CT imaging of the abdomen and pelvis was performed using the standard protocol following bolus administration of intravenous contrast. RADIATION DOSE REDUCTION: This exam was performed according to the departmental dose-optimization program which includes automated exposure control, adjustment of the mA and/or kV according to patient size and/or use of iterative reconstruction technique. CONTRAST:  75mL OMNIPAQUE IOHEXOL 350 MG/ML SOLN COMPARISON:  CT abdomen and pelvis dated 01/01/2022 FINDINGS: Lower chest: Scattered left lower lobe cysts, unchanged. No pleural effusion or pneumothorax demonstrated. Partially imaged heart size is normal. Coronary artery calcifications. Hepatobiliary: No focal hepatic lesions. No intra or extrahepatic biliary ductal dilation. Cholelithiasis. Pancreas: No focal lesions or main ductal dilation. Spleen: Normal in size without focal abnormality. Adrenals/Urinary Tract: No adrenal nodules. No suspicious renal mass, calculi or hydronephrosis. No focal bladder wall thickening. Stomach/Bowel: Normal appearance of the stomach. Mural thickening of the sigmoid colon with fistulization to midline abdominal collections as below. Normal appendix. Vascular/Lymphatic: Aortic atherosclerosis. No enlarged abdominal or pelvic lymph nodes. Reproductive: No adnexal masses. Other: Previously noted drainage catheters have been removed, including left lower quadrant approach drain with increased size of gas-containing midline lower abdomen irregular collection measuring 7.5 x 4.3 cm (3:73), previously 2.2 x 2.2 cm, which demonstrates fistulous connection with the sigmoid colon. An irregular collection in communication with this collection posteriorly extends into the rectouterine pouch, measuring 6.8 x 3.2 cm (3:72), previously 3.5 x 3.5 cm. Musculoskeletal: No acute or abnormal lytic or blastic osseous lesions.  Multilevel degenerative changes of the partially imaged thoracic and lumbar spine. Subcutaneous stranding and emphysema along the left anterior lower abdominal wall, where a drainage catheter was seen previously. Interval development of a gas-containing  fluid collection along this tract within the left abdominal musculature measuring 2.8 x 2.0 cm (3:67). IMPRESSION: 1. 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 An irregular collection in communication posteriorly extends into the rectouterine pouch, measuring 6.8 x 3.2 cm, previously 3.5 x 3.5 cm. 2. Subcutaneous stranding and emphysema along the left anterior lower abdominal wall, where a drainage catheter was seen previously. Interval development of a gas-containing fluid collection along this tract within the left abdominal musculature measuring 2.8 x 2.0 cm. 3. Aortic Atherosclerosis (ICD10-I70.0). Electronically Signed   By: Agustin Cree M.D.   On: 05/10/2022 18:52    Pending Labs Unresulted Labs (From admission, onward)     Start     Ordered   05/10/22 1512  Urinalysis, Routine w reflex microscopic -Urine, Clean Catch  Once,   URGENT       Question:  Specimen Source  Answer:  Urine, Clean Catch   05/10/22 1512            Vitals/Pain Today's Vitals   05/10/22 1600 05/10/22 1615 05/10/22 1645 05/10/22 1745  BP: (!) 109/47 (!) 113/94 109/66 96/71  Pulse: 71 72 68 70  Resp: 16   16  Temp:      TempSrc:      SpO2: 96% 100% 98% 97%  Weight:      Height:      PainSc:        Isolation Precautions No active isolations  Medications Medications  lactated ringers infusion (has no administration in time range)  piperacillin-tazobactam (ZOSYN) IVPB 3.375 g (has no administration in time range)  fentaNYL (SUBLIMAZE) injection  50 mcg (50 mcg Intravenous Given 05/10/22 1550)  sodium chloride 0.9 % bolus 1,000 mL (0 mLs Intravenous Stopped 05/10/22 1933)  iohexol (OMNIPAQUE) 350 MG/ML injection 75 mL (75 mLs Intravenous Contrast Given 05/10/22 1837)  0.9 %  sodium chloride infusion ( Intravenous New Bag/Given 05/10/22 1932)  fentaNYL (SUBLIMAZE) injection 50 mcg (50 mcg Intravenous Given 05/10/22 2008)    Mobility walks     Focused Assessments Pt had abd drain that came out at home, has some leaking at the site of where the drain dislogged. Pt is otherwise stable but having abd pain     R Recommendations: See Admitting Provider Note  Report given to:   Additional Notes:  Call or epic message for any additional questions

## 2022-05-10 NOTE — Assessment & Plan Note (Signed)
Stable.continue cozaar for now.

## 2022-05-10 NOTE — Assessment & Plan Note (Signed)
Continue with lasix 20 mg bid. Pt states this is her home dose.

## 2022-05-11 ENCOUNTER — Encounter (HOSPITAL_COMMUNITY): Payer: Self-pay | Admitting: Internal Medicine

## 2022-05-11 ENCOUNTER — Inpatient Hospital Stay (HOSPITAL_COMMUNITY): Payer: Medicare PPO

## 2022-05-11 DIAGNOSIS — I10 Essential (primary) hypertension: Secondary | ICD-10-CM | POA: Diagnosis not present

## 2022-05-11 DIAGNOSIS — F319 Bipolar disorder, unspecified: Secondary | ICD-10-CM | POA: Diagnosis not present

## 2022-05-11 DIAGNOSIS — E1169 Type 2 diabetes mellitus with other specified complication: Secondary | ICD-10-CM | POA: Diagnosis not present

## 2022-05-11 DIAGNOSIS — I272 Pulmonary hypertension, unspecified: Secondary | ICD-10-CM

## 2022-05-11 DIAGNOSIS — K651 Peritoneal abscess: Secondary | ICD-10-CM | POA: Diagnosis not present

## 2022-05-11 DIAGNOSIS — Z01810 Encounter for preprocedural cardiovascular examination: Secondary | ICD-10-CM

## 2022-05-11 LAB — CBC WITH DIFFERENTIAL/PLATELET
Abs Immature Granulocytes: 0.02 10*3/uL (ref 0.00–0.07)
Basophils Absolute: 0 10*3/uL (ref 0.0–0.1)
Basophils Relative: 1 %
Eosinophils Absolute: 0.3 10*3/uL (ref 0.0–0.5)
Eosinophils Relative: 5 %
HCT: 41.5 % (ref 36.0–46.0)
Hemoglobin: 13.6 g/dL (ref 12.0–15.0)
Immature Granulocytes: 0 %
Lymphocytes Relative: 23 %
Lymphs Abs: 1.5 10*3/uL (ref 0.7–4.0)
MCH: 28.6 pg (ref 26.0–34.0)
MCHC: 32.8 g/dL (ref 30.0–36.0)
MCV: 87.4 fL (ref 80.0–100.0)
Monocytes Absolute: 0.6 10*3/uL (ref 0.1–1.0)
Monocytes Relative: 10 %
Neutro Abs: 4 10*3/uL (ref 1.7–7.7)
Neutrophils Relative %: 61 %
Platelets: 362 10*3/uL (ref 150–400)
RBC: 4.75 MIL/uL (ref 3.87–5.11)
RDW: 15.1 % (ref 11.5–15.5)
WBC: 6.5 10*3/uL (ref 4.0–10.5)
nRBC: 0 % (ref 0.0–0.2)

## 2022-05-11 LAB — COMPREHENSIVE METABOLIC PANEL
ALT: 29 U/L (ref 0–44)
AST: 33 U/L (ref 15–41)
Albumin: 2.3 g/dL — ABNORMAL LOW (ref 3.5–5.0)
Alkaline Phosphatase: 79 U/L (ref 38–126)
Anion gap: 10 (ref 5–15)
BUN: 15 mg/dL (ref 6–20)
CO2: 26 mmol/L (ref 22–32)
Calcium: 9.2 mg/dL (ref 8.9–10.3)
Chloride: 103 mmol/L (ref 98–111)
Creatinine, Ser: 0.76 mg/dL (ref 0.44–1.00)
GFR, Estimated: >60 mL/min (ref >60)
Glucose, Bld: 122 mg/dL — ABNORMAL HIGH (ref 70–99)
Potassium: 3.6 mmol/L (ref 3.5–5.1)
Sodium: 139 mmol/L (ref 135–145)
Total Bilirubin: 0.8 mg/dL (ref 0.3–1.2)
Total Protein: 6.2 g/dL — ABNORMAL LOW (ref 6.5–8.1)

## 2022-05-11 LAB — GLUCOSE, CAPILLARY
Glucose-Capillary: 121 mg/dL — ABNORMAL HIGH (ref 70–99)
Glucose-Capillary: 122 mg/dL — ABNORMAL HIGH (ref 70–99)
Glucose-Capillary: 131 mg/dL — ABNORMAL HIGH (ref 70–99)
Glucose-Capillary: 182 mg/dL — ABNORMAL HIGH (ref 70–99)
Glucose-Capillary: 95 mg/dL (ref 70–99)
Glucose-Capillary: 96 mg/dL (ref 70–99)

## 2022-05-11 LAB — URINALYSIS, ROUTINE W REFLEX MICROSCOPIC
Glucose, UA: NEGATIVE mg/dL
Hgb urine dipstick: NEGATIVE
Ketones, ur: NEGATIVE mg/dL
Leukocytes,Ua: NEGATIVE
Nitrite: NEGATIVE
Protein, ur: 30 mg/dL — AB
Specific Gravity, Urine: 1.025 (ref 1.005–1.030)
pH: 5.5 (ref 5.0–8.0)

## 2022-05-11 LAB — PROTIME-INR
INR: 1.1 (ref 0.8–1.2)
Prothrombin Time: 13.9 seconds (ref 11.4–15.2)

## 2022-05-11 LAB — URINALYSIS, MICROSCOPIC (REFLEX)

## 2022-05-11 LAB — MAGNESIUM: Magnesium: 1.8 mg/dL (ref 1.7–2.4)

## 2022-05-11 LAB — AEROBIC/ANAEROBIC CULTURE W GRAM STAIN (SURGICAL/DEEP WOUND)

## 2022-05-11 MED ORDER — LEVALBUTEROL HCL 0.63 MG/3ML IN NEBU: 0.6300 mg | INHALATION_SOLUTION | Freq: Four times a day (QID) | RESPIRATORY_TRACT | Status: DC | PRN

## 2022-05-11 MED ORDER — ASPIRIN 81 MG PO TBEC
81.0000 mg | DELAYED_RELEASE_TABLET | Freq: Every day | ORAL | Status: DC
  Administered 2022-05-11 – 2022-05-15 (×5): 81 mg via ORAL
  Filled 2022-05-11 (×5): qty 1

## 2022-05-11 MED ORDER — FUROSEMIDE 20 MG PO TABS: 20.0000 mg | ORAL_TABLET | Freq: Two times a day (BID) | ORAL | Status: DC

## 2022-05-11 MED ORDER — FENTANYL CITRATE (PF) 100 MCG/2ML IJ SOLN
INTRAMUSCULAR | Status: AC
  Filled 2022-05-11: qty 4

## 2022-05-11 MED ORDER — AMPHETAMINE-DEXTROAMPHETAMINE 10 MG PO TABS
20.0000 mg | ORAL_TABLET | Freq: Four times a day (QID) | ORAL | Status: DC
  Filled 2022-05-11 (×7): qty 2

## 2022-05-11 MED ORDER — LOSARTAN POTASSIUM 50 MG PO TABS
25.0000 mg | ORAL_TABLET | Freq: Two times a day (BID) | ORAL | Status: DC
  Administered 2022-05-12 – 2022-05-15 (×6): 25 mg via ORAL
  Filled 2022-05-11 (×9): qty 1

## 2022-05-11 MED ORDER — SODIUM CHLORIDE 0.9% FLUSH
5.0000 mL | Freq: Three times a day (TID) | INTRAVENOUS | Status: DC
  Administered 2022-05-11 – 2022-05-15 (×12): 5 mL

## 2022-05-11 MED ORDER — ENOXAPARIN SODIUM 60 MG/0.6ML IJ SOSY
50.0000 mg | PREFILLED_SYRINGE | INTRAMUSCULAR | Status: DC
  Administered 2022-05-11 – 2022-05-14 (×4): 50 mg via SUBCUTANEOUS
  Filled 2022-05-11 (×4): qty 0.6

## 2022-05-11 MED ORDER — FENTANYL CITRATE (PF) 100 MCG/2ML IJ SOLN
INTRAMUSCULAR | Status: AC | PRN
  Administered 2022-05-11 (×3): 50 ug via INTRAVENOUS
  Administered 2022-05-11 (×2): 25 ug via INTRAVENOUS

## 2022-05-11 MED ORDER — MIDAZOLAM HCL 2 MG/2ML IJ SOLN
INTRAMUSCULAR | Status: AC
  Filled 2022-05-11: qty 4

## 2022-05-11 MED ORDER — FUROSEMIDE 40 MG PO TABS
40.0000 mg | ORAL_TABLET | Freq: Two times a day (BID) | ORAL | Status: DC
  Administered 2022-05-12 – 2022-05-15 (×7): 40 mg via ORAL
  Filled 2022-05-11 (×7): qty 1

## 2022-05-11 MED ORDER — ALPRAZOLAM 0.5 MG PO TABS
1.0000 mg | ORAL_TABLET | Freq: Three times a day (TID) | ORAL | Status: DC
  Administered 2022-05-11 – 2022-05-15 (×10): 1 mg via ORAL
  Filled 2022-05-11 (×11): qty 2

## 2022-05-11 MED ORDER — B COMPLEX PO TABS: 1.0000 | ORAL_TABLET | Freq: Every day | ORAL | Status: DC

## 2022-05-11 MED ORDER — ALBUTEROL SULFATE (2.5 MG/3ML) 0.083% IN NEBU
2.5000 mg | INHALATION_SOLUTION | Freq: Four times a day (QID) | RESPIRATORY_TRACT | Status: DC | PRN
  Administered 2022-05-11: 2.5 mg via RESPIRATORY_TRACT
  Filled 2022-05-11: qty 3

## 2022-05-11 MED ORDER — MIDAZOLAM HCL 2 MG/2ML IJ SOLN
INTRAMUSCULAR | Status: AC | PRN
  Administered 2022-05-11 (×2): .5 mg via INTRAVENOUS
  Administered 2022-05-11 (×2): 1 mg via INTRAVENOUS
  Administered 2022-05-11: .5 mg via INTRAVENOUS

## 2022-05-11 NOTE — Consult Note (Signed)
WOC Nurse Consult Note: Reason for Consult:Drainage from previous drain tube insertion site. WOC Nursing is consulted simultaneously with [facility].  They have not yet seen. Wound type:full thickness, possible infectious Pressure Injury POA: N/A Drainage (amount, consistency, odor) thick, serosanguinous Periwound:erythematous Dressing procedure/placement/frequency: I will provide Nursing with guidance for the topical care of this draining wound, using a soap and water cleanse, NS rinse and topping with a silver hydrofiber (Aquacel Ag+ Advantage) for absorbing of drainage and donation of antimicrobial silver properties.   Any orders provided by [facility] providers will supercede mine.  WOC nursing team will not follow, but will remain available to this patient, the nursing and medical teams.  Please re-consult if needed.  Thank you for inviting [place] to participate in this patient's Plan of Care.  Ladona Mow, MSN, RN, CNS, GNP, Leda Min, [employer], [employer] phone:  [phone]

## 2022-05-11 NOTE — Consult Note (Addendum)
Cardiology Consultation   Patient ID: KATANYA KRASNOW MRN: [id number]; DOB: [birth year]  Admit date: 05/10/2022 Date of Consult: 05/11/2022  PCP:  [place], Betty G, MD   [facility] Providers Cardiologist:  [name], MD   {   Patient Profile:   [name] is a 52 y.o. female with a hx of dilated cardiomyopathy, pulmonary hypertension, hypertension, OSA, hyperlipidemia, morbid obesity, type 2 diabetes, diverticulitis who is being seen 05/11/2022 for the evaluation of preoperative clearance for intra-abdominal abscess at the request of Dr. Janee Morn.  History of Present Illness:   [name] has cardiac history significant for mildly reduced ejection fraction cardiomyopathy with mildly elevated pulmonary pressures.  Initially she had a mildly reduced EF when she was much heavier around 400 pounds but since losing the weight she has had return of normal EF.  She is followed by Dr. Antoine Poche, last seen in January 2023 with no complaints and scheduled follow up in 18 months.  Generally she is seen infrequently with minimal complaints and overall stable disease.  Last echocardiogram was in February 2022 that noted an LVEF of 60 to 65% with grade 1 diastolic dysfunction.    Generally patient is not very active and is limited by her balance for activity, rather than symptoms.  However, she is able to complete her ADLs with minimal difficulty.  She was recently hospitalized for diverticulitis and has been seeing physical therapy regularly.  She does not ever have complaints of chest pain, palpitations, syncope, dizziness, or significant difficulties with fluid overload.  She does take 20 mg of Lasix twice daily for minimal swelling.  Patient is a non-smoker, does not drink, does not use any illicit drugs.  No family history of coronary artery disease. Last A1c was 5.9%  Yesterday on 05/10/2022 patient was seen in the emergency department with complaints of left lower  quadrant pain with drainage of blood and purulence from her prior diverticulitis drain site.  She has history of diverticulitis and has had to have multiple abdominal drains.  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  General surgery has recommended IR drainage for now and possible resection that would entail colostomy creation.  Cardiology has been asked to preoperatively evaluate her for this procedure.   Past Medical History:  Diagnosis Date   Acute systolic congestive heart failure    Agoraphobia    Anxiety    panic attacks   Arthritis    osteoarthritis bilal. knees   Asthma    no per PFT 7/13; reports does not have asthma   Bipolar disorder    Carpal tunnel syndrome of left wrist 05/2011   Being evaluated for MS   Cor pulmonale    Depression    History of pleural effusion    Hyperlipidemia    Hypertension    Hypoxemia    history of - no home O2   IBS (irritable bowel syndrome)    Morbid obesity    Obesity hypoventilation syndrome    Perforation of sigmoid colon due to diverticulitis 12/13/2021   Pneumonia    Pre-diabetes    Prediabetes    Seasonal allergies    current runny nose   Seizures    febrile seizure x 1 as a child. Several times   Sleep apnea sleep study 07/02/2010   uses CPAP nightly   Spinal stenosis in cervical region    Urinary incontinence     Past  Surgical History:  Procedure Laterality Date   APPLICATION OF INTRAOPERATIVE CT SCAN N/A 05/14/2019   Procedure: APPLICATION OF INTRAOPERATIVE CT SCAN;  Surgeon: Maeola Harman, MD;  Location: [facility] OR;  Service: Neurosurgery;  Laterality: N/A;   CARPAL TUNNEL RELEASE  06/27/2011   Procedure: CARPAL TUNNEL RELEASE;  Surgeon: Nicki Reaper, MD;  Location: [facility];  Service: Orthopedics;  Laterality: Left;   CARPAL TUNNEL RELEASE  12/19/2011   Procedure: CARPAL TUNNEL RELEASE;  Surgeon: Nicki Reaper, MD;  Location: [facility];  Service: Orthopedics;  Laterality: Right;   INCISION AND DRAINAGE ABSCESS Left 08/27/2017   Procedure: INCISION AND DRAINAGE LEFT BUTTOCK  ABSCESS;  Surgeon: Glenna Fellows, MD;  Location: [facility];  Service: General;  Laterality: Left;   IR RADIOLOGIST EVAL & MGMT  03/16/2022   IR RADIOLOGIST EVAL & MGMT  04/18/2022   IR SINUS/FIST TUBE CHK-NON GI  01/03/2022   IR SINUS/FIST TUBE CHK-NON GI  01/03/2022   IR SINUS/FIST TUBE CHK-NON GI  01/03/2022   IR SINUS/FIST TUBE CHK-NON GI  01/03/2022   IRRIGATION AND DEBRIDEMENT BUTTOCKS Left 08/30/2017   Procedure: IRRIGATION AND DEBRIDEMENT RE-EXCISION OF LEFT SUBCUTANEOUS BUTTOCKS ABCESS;  Surgeon: Rodman Pickle, MD;  Location: [facility];  Service: General;  Laterality: Left;   LAPAROSCOPY N/A 12/11/2021   Procedure: LAPAROSCOPIC PERITONEAL LAVAGE WITH DRAIN PLACEMENT;  Surgeon: Quentin Ore, MD;  Location: [facility] OR;  Service: General;  Laterality: N/A;   MASS EXCISION  08/07/2010   right index   POSTERIOR CERVICAL FUSION/FORAMINOTOMY  09/11/2011   Procedure: POSTERIOR CERVICAL FUSION/FORAMINOTOMY LEVEL 1;  Surgeon: Maeola Harman, MD;  Location: [facility];  Service: Neurosurgery;  Laterality: N/A;  Cervical Three-Four Posteior Cervical Fusion and Decompression.   POSTERIOR CERVICAL FUSION/FORAMINOTOMY N/A 05/14/2019   Procedure: Posterior cervical decompression/fusion Cervical four to Thoracic one with exploration/revision of Cervical threee-four fusion;  Surgeon: Maeola Harman, MD;  Location: [facility] OR;  Service: Neurosurgery;  Laterality: N/A;   TRIGGER FINGER RELEASE  12/19/2011   Procedure: RELEASE TRIGGER FINGER/A-1 PULLEY;  Surgeon: Nicki Reaper, MD;  Location: [facility];  Service: Orthopedics;  Laterality: Right;   TRIGGER FINGER RELEASE Left 12/31/2012   Procedure: RELEASE A-1 PULLEY LEFT THUMB;  Surgeon: Nicki Reaper, MD;  Location: [facility];  Service: Orthopedics;  Laterality: Left;   WISDOM  TOOTH EXTRACTION       Inpatient Medications: Scheduled Meds:  atorvastatin  20 mg Oral Daily   cariprazine  6 mg Oral QPM   Dextromethorphan-quiNIDine  1 capsule Oral Q12H   [START ON 05/12/2022] furosemide  20 mg Oral BID   heparin  5,000 Units Subcutaneous Q8H   insulin aspart  0-15 Units Subcutaneous Q4H   [START ON 05/12/2022] losartan  25 mg Oral BID   pregabalin  300 mg Oral BID   sodium chloride flush  5 mL Intracatheter Q8H   Continuous Infusions:  lactated ringers 100 mL/hr at 05/11/22 0538   piperacillin-tazobactam (ZOSYN)  IV 3.375 g (05/11/22 1413)   PRN Meds: acetaminophen **OR** acetaminophen, HYDROmorphone (DILAUDID) injection, melatonin, ondansetron **OR** ondansetron (ZOFRAN) IV, oxyCODONE  Allergies:    Allergies  Allergen Reactions   Savella [Milnacipran Hcl] Other (See Comments)    mania    Social History:   Social History   Socioeconomic History   Marital status: Widowed    Spouse name: Not on file   Number of children: Not on file   Years  of education: Not on file   Highest education level: Bachelor's degree (e.g., BA, AB, BS)  Occupational History   Occupation: currently unemployed  Tobacco Use   Smoking status: Never   Smokeless tobacco: Never  Vaping Use   Vaping Use: Never used  Substance and Sexual Activity   Alcohol use: Yes    Comment: rare   Drug use: No   Sexual activity: Not Currently  Other Topics Concern   Not on file  Social History Narrative   Lives in [place]         Social Determinants of Health   Financial Resource Strain: Low Risk  (04/02/2022)   Overall Financial Resource Strain (CARDIA)    Difficulty of Paying Living Expenses: Not hard at all  Food Insecurity: No Food Insecurity (05/10/2022)   Hunger Vital Sign    Worried About Running Out of Food in the Last Year: Never true    Ran Out of Food in the Last Year: Never true  Transportation Needs: Unmet Transportation Needs (05/10/2022)   PRAPARE - Therapist, art (Medical): Yes    Lack of Transportation (Non-Medical): No  Physical Activity: Insufficiently Active (04/02/2022)   Exercise Vital Sign    Days of Exercise per Week: 2 days    Minutes of Exercise per Session: 30 min  Stress: No Stress Concern Present (04/02/2022)   [employer] of Occupational Health - Occupational Stress Questionnaire    Feeling of Stress : Not at all  Social Connections: Moderately Integrated (04/02/2022)   Social Connection and Isolation Panel [NHANES]    Frequency of Communication with Friends and Family: More than three times a week    Frequency of Social Gatherings with Friends and Family: More than three times a week    Attends Religious Services: More than 4 times per year    Active Member of [employer] or Organizations: Yes    Attends Banker Meetings: More than 4 times per year    Marital Status: Widowed  Intimate Partner Violence: Not At Risk (05/10/2022)   Humiliation, Afraid, Rape, and Kick questionnaire    Fear of Current or Ex-Partner: No    Emotionally Abused: No    Physically Abused: No    Sexually Abused: No    Family History:   Family History  Problem Relation Age of Onset   Asthma Mother    Allergic rhinitis Mother    Multiple sclerosis Mother    Asthma Maternal Grandmother    Breast cancer Neg Hx      ROS:  Please see the history of present illness.  All other ROS reviewed and negative.     Physical Exam/Data:   Vitals:   05/11/22 1320 05/11/22 1325 05/11/22 1330 05/11/22 1434  BP: (!) 145/91 136/81 (!) 141/77 (!) 108/58  Pulse: 66 64 64 60  Resp: Temp:    (!) 97.2 F (36.2 C)  TempSrc:    Axillary  SpO2: 97% 98% 95% 95%  Weight:      Height:        Intake/Output Summary (Last 24 hours) at 05/11/2022 1544 Last data filed at 05/11/2022 1521 Gross per 24 hour  Intake 732.32 ml  Output 380 ml  Net 352.32 ml      05/10/2022    9:20 PM 05/10/2022    2:46 PM 04/02/2022    2:23 PM   Last 3 Weights  Weight (lbs) 248 lb 7.3 oz 268 lb 15.4  oz 269 lb  Weight (kg) 112.7 kg 122 kg 122.018 kg     Body mass index is 41.35 kg/m.  General:  Well nourished, well developed, in no acute distress HEENT: normal Neck: no JVD Vascular: No carotid bruits; Distal pulses 2+ bilaterally Cardiac:  normal S1, S2; RRR; no murmur  Lungs: Wheezing bilaterally Abd: soft, nontender, no hepatomegaly  Ext: no edema Musculoskeletal:  No deformities, BUE and BLE strength normal and equal Skin: warm and dry  Neuro:  CNs 2-12 intact, no focal abnormalities noted Psych:  Normal affect   EKG:  The EKG was personally reviewed and demonstrates: No EKG to review. Telemetry:  Telemetry was personally reviewed and demonstrates: Not hooked up to telemetry  Relevant CV Studies: Echocardiogram 03/23/2020   1. Left ventricular ejection fraction, by estimation, is 60 to 65%. The  left ventricle has normal function. The left ventricle has no regional  wall motion abnormalities. There is mild concentric left ventricular  hypertrophy. Left ventricular diastolic  parameters are consistent with Grade I diastolic dysfunction (impaired  relaxation).   2. Right ventricular systolic function is normal. The right ventricular  size is mildly enlarged. Tricuspid regurgitation signal is inadequate for  assessing PA pressure.   3. The mitral valve is grossly normal. Trivial mitral valve  regurgitation.   4. The aortic valve is tricuspid. There is mild calcification of the  aortic valve. There is mild thickening of the aortic valve. Aortic valve  regurgitation is not visualized. Mild aortic valve sclerosis is present,  with no evidence of aortic valve  stenosis.   5. The inferior vena cava is dilated in size with >50% respiratory  variability, suggesting right atrial pressure of 8 mmHg.   Comparison(s): No significant change from prior study.   Laboratory Data:  High Sensitivity Troponin:  No results for  input(s): "TROPONINIHS" in the last 720 hours.   Chemistry Recent Labs  Lab 05/10/22 1448 05/11/22 0544  NA 136 139  K 3.6 3.6  CL 99 103  CO2 23 26  GLUCOSE 208* 122*  BUN 15 15  CREATININE 0.77 0.76  CALCIUM 9.5 9.2  MG  --  1.8  GFRNONAA >60 >60  ANIONGAP 14 10    Recent Labs  Lab 05/10/22 1448 05/11/22 0544  PROT 6.8 6.2*  ALBUMIN 2.7* 2.3*  AST 29 33  ALT 27 29  ALKPHOS 95 79  BILITOT 0.9 0.8   Lipids No results for input(s): "CHOL", "TRIG", "HDL", "LABVLDL", "LDLCALC", "CHOLHDL" in the last 168 hours.  Hematology Recent Labs  Lab 05/10/22 1448 05/11/22 0544  WBC 9.8 6.5  RBC 5.21* 4.75  HGB 15.0 13.6  HCT 45.7 41.5  MCV 87.7 87.4  MCH 28.8 28.6  MCHC 32.8 32.8  RDW 15.1 15.1  PLT 381 362   Thyroid No results for input(s): "TSH", "FREET4" in the last 168 hours.  BNPNo results for input(s): "BNP", "PROBNP" in the last 168 hours.  DDimer No results for input(s): "DDIMER" in the last 168 hours.   Radiology/Studies:  CT ABDOMEN PELVIS W CONTRAST  Result Date: 05/10/2022 CLINICAL DATA:  History of diverticulitis complicated by postoperative fluid, status post drainage placement. Now presenting with inadvertent drain removal. EXAM: CT ABDOMEN AND PELVIS WITH CONTRAST TECHNIQUE: Multidetector CT imaging of the abdomen and pelvis was performed using the standard protocol following bolus administration of intravenous contrast. RADIATION DOSE REDUCTION: This exam was performed according to the departmental dose-optimization program which includes automated exposure control, adjustment of the mA  and/or kV according to patient size and/or use of iterative reconstruction technique. CONTRAST:  75mL OMNIPAQUE IOHEXOL 350 MG/ML SOLN COMPARISON:  CT abdomen and pelvis dated 01/01/2022 FINDINGS: Lower chest: Scattered left lower lobe cysts, unchanged. No pleural effusion or pneumothorax demonstrated. Partially imaged heart size is normal. Coronary artery calcifications.  Hepatobiliary: No focal hepatic lesions. No intra or extrahepatic biliary ductal dilation. Cholelithiasis. Pancreas: No focal lesions or main ductal dilation. Spleen: Normal in size without focal abnormality. Adrenals/Urinary Tract: No adrenal nodules. No suspicious renal mass, calculi or hydronephrosis. No focal bladder wall thickening. Stomach/Bowel: Normal appearance of the stomach. Mural thickening of the sigmoid colon with fistulization to midline abdominal collections as below. Normal appendix. Vascular/Lymphatic: Aortic atherosclerosis. No enlarged abdominal or pelvic lymph nodes. Reproductive: No adnexal masses. Other: Previously noted drainage catheters have been removed, including left lower quadrant approach drain with increased size of gas-containing midline lower abdomen irregular collection measuring 7.5 x 4.3 cm (3:73), previously 2.2 x 2.2 cm, which demonstrates fistulous connection with the sigmoid colon. An irregular collection in communication with this collection posteriorly extends into the rectouterine pouch, measuring 6.8 x 3.2 cm (3:72), previously 3.5 x 3.5 cm. Musculoskeletal: No acute or abnormal lytic or blastic osseous lesions. Multilevel degenerative changes of the partially imaged thoracic and lumbar spine. Subcutaneous stranding and emphysema along the left anterior lower abdominal wall, where a drainage catheter was seen previously. Interval development of a gas-containing fluid collection along this tract within the left abdominal musculature measuring 2.8 x 2.0 cm (3:67). IMPRESSION: 1. 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 An irregular collection in communication posteriorly extends into the rectouterine pouch, measuring 6.8 x 3.2 cm, previously 3.5 x 3.5 cm.  2. Subcutaneous stranding and emphysema along the left anterior lower abdominal wall, where a drainage catheter was seen previously. Interval development of a gas-containing fluid collection along this tract within the left abdominal musculature measuring 2.8 x 2.0 cm. 3. Aortic Atherosclerosis (ICD10-I70.0). Electronically Signed   By: Agustin Cree M.D.   On: 05/10/2022 18:52     Assessment and Plan:   Preoperative evaluation for diverticulitis drain/ possible colostomy creation  Patient with no prior history of coronary artery disease and minimal risk factors.  She has history of mildly reduced EF that has been stable and most recent echo indicates normal EF of 60 to 65% with no significant difficulties completing ADLs.  Does not have any chest pain or history of it.  Her revised cardiac risk index is 2 which indicates a class III risk and 10.1% of MACE.  At this time I do not see any indication for further workup.  Will consult with MD for any other recommendations Consider obtaining EKG Use fluid infusions judiciously    Dilated cardiomyopathy HTN Generally stable disease with mild complaints of leg edema being managed on losartan 25mg  daily and Lasix 20 mg twice daily.  Last echocardiogram indicated normal EF of 60 to 65% and diastolic dysfunction in 2022. Euvolemic on exam.  Blood pressure also seems to be well-controlled.  HLD Currently on atorvastatin 20 mg daily.  Last LDL was 5 months ago and was 59    Risk Assessment/Risk Scores:  New York Heart Association (NYHA) Functional Class NYHA Class II    For questions or updates, please contact [facility] Please consult  [email or web address] for contact info under    Signed, Abagail Kitchens, PA-C  05/11/2022 3:44 PM   Patient seen and examined and agree with Yvonna Alanis, PA-C as detailed above.  In brief, the patient is a 52 year old female with history of moderate pulmonary HTN in the setting of morbid obesity and OSA, prior  mildly reduced EF per report (was 50-55% in 2017 and 60-65% in 2022), DMII, HLD and history of diverticulitis who presented with LLQ pain found to have new fluid collections on CT with fistula to colon. Patient is now planned for IR drainage and possible resection. Cardiology is consulted for pre-op evaluation.  Patient is minimally mobile at baseline due to chronic joint pain but denies any anginal symptoms or worsening of her baseline dyspnea on exertion. States she is able to complete all ADLs without issue. Given body habitus, in-patient myoview less likely to be diagnostic and given absence of symptoms and ability to tolerate sedation in the past, will not repeat at this time. Will check TTE to ensure no interval change and ensure pulmonary pressures are stable. Would hold further IVF for now.   GEN: No acute distress.   Neck: No JVD Cardiac: RRR, no murmurs, rubs, or gallops.  Respiratory: Faint crackles at bases. Otherwise clear GI: Obese, soft, TTP in LLQ which is draining brown tinged fluid MS: No edema; No deformity. Neuro:  Nonfocal  Psych: Normal affect    Plan: -Will repeat limited TTE to assess LVEF, RV and PA systolic pressure given history of pulmonary HTN -No plan for inpatient myoview as less likely to be diagnostic given body habitus and patient has no anginal symptoms; has tolerated sedation/intubation in 11/2021 without issue -Overall she is moderate CV risk (revised cardiac risk index 0.9% of MACE but this does not take into account morbid obesity or pulmonary HTN) -Continue ASA 81mg  daily -Continue lipitor 20mg  BID -Stop IVF for now and agree with resuming lasix tomorrow; will need judicious use of IVF in the peri-op period as patient requires lasix 80mg  in AM and 40mg  in PM to maintain euvolemia as an outpatient  [name], MD

## 2022-05-11 NOTE — Consult Note (Signed)
Consult Note  [name] 01-20-1971  [id number].    Requesting MD: Dr. Lockie Mola Chief Complaint/Reason for Consult: intraabdominal abscess   HPI:  52 y.o. female with medical history significant for Chronic combined systolic and diastolic CHF, HTN, OSA on CPAP, T2DM, bipolar disorder, morbid obesity, who presented to [facility] ED 4/11 with LLQ abdominal pain and drainage. Pain began a few days prior and drainage on day of presentation.  She is known to general surgery service and underwent dx lap washout, evac abscess and drain placement x3 Dr. Dossie Der 11/13. Ultimately with findings of perforated diverticulitis. She had multiple drain placements by IR during her prolonged hospitalization and was discharged 01/05/22 Last office follow up with [place] 02/16/22 with Dr. Dossie Der. At that time he recommended eventual colonoscopy and eventual elective sigmoid colectomy. She still had left lower quadrant drain in place at that time and drain though to be in communication with colon. This was removed 3/20 after IR eval showed no fistula or residual collections.  She states over the past few days she has had worsening abdominal pain and then noted new drainage from her prior LLQ drain site in the last 24-48 hours. She has had more loose bowel movments recently but denies melena, hematochezia, or pain. She otherwise had been recovering well from past hospitalization with good PO intake.    ROS: ROS as above  Family History  Problem Relation Age of Onset   Asthma Mother    Allergic rhinitis Mother    Multiple sclerosis Mother    Asthma Maternal Grandmother    Breast cancer Neg Hx     Past Medical History:  Diagnosis Date   Acute systolic congestive heart failure    Agoraphobia    Anxiety    panic attacks   Arthritis    osteoarthritis bilal. knees   Asthma    no per PFT 7/13; reports does not have asthma   Bipolar disorder    Carpal tunnel syndrome of left wrist 05/2011    Being evaluated for MS   Cor pulmonale    Depression    History of pleural effusion    Hyperlipidemia    Hypertension    Hypoxemia    history of - no home O2   IBS (irritable bowel syndrome)    Morbid obesity    Obesity hypoventilation syndrome    Perforation of sigmoid colon due to diverticulitis 12/13/2021   Pneumonia    Pre-diabetes    Prediabetes    Seasonal allergies    current runny nose   Seizures    febrile seizure x 1 as a child. Several times   Sleep apnea sleep study 07/02/2010   uses CPAP nightly   Spinal stenosis in cervical region    Urinary incontinence     Past Surgical History:  Procedure Laterality Date   APPLICATION OF INTRAOPERATIVE CT SCAN N/A 05/14/2019   Procedure: APPLICATION OF INTRAOPERATIVE CT SCAN;  Surgeon: Maeola Harman, MD;  Location: [facility] OR;  Service: Neurosurgery;  Laterality: N/A;   CARPAL TUNNEL RELEASE  06/27/2011   Procedure: CARPAL TUNNEL RELEASE;  Surgeon: Nicki Reaper, MD;  Location: [facility];  Service: Orthopedics;  Laterality: Left;   CARPAL TUNNEL RELEASE  12/19/2011   Procedure: CARPAL TUNNEL RELEASE;  Surgeon: Nicki Reaper, MD;  Location: [facility];  Service: Orthopedics;  Laterality: Right;   INCISION AND DRAINAGE ABSCESS Left 08/27/2017   Procedure: INCISION AND DRAINAGE LEFT BUTTOCK  ABSCESS;  Surgeon: Glenna Fellows, MD;  Location: [facility];  Service: General;  Laterality: Left;   IR RADIOLOGIST EVAL & MGMT  03/16/2022   IR RADIOLOGIST EVAL & MGMT  04/18/2022   IR SINUS/FIST TUBE CHK-NON GI  01/03/2022   IR SINUS/FIST TUBE CHK-NON GI  01/03/2022   IR SINUS/FIST TUBE CHK-NON GI  01/03/2022   IR SINUS/FIST TUBE CHK-NON GI  01/03/2022   IRRIGATION AND DEBRIDEMENT BUTTOCKS Left 08/30/2017   Procedure: IRRIGATION AND DEBRIDEMENT RE-EXCISION OF LEFT SUBCUTANEOUS BUTTOCKS ABCESS;  Surgeon: Rodman Pickle, MD;  Location: [facility];  Service: General;  Laterality: Left;   LAPAROSCOPY N/A 12/11/2021    Procedure: LAPAROSCOPIC PERITONEAL LAVAGE WITH DRAIN PLACEMENT;  Surgeon: Quentin Ore, MD;  Location: [facility] OR;  Service: General;  Laterality: N/A;   MASS EXCISION  08/07/2010   right index   POSTERIOR CERVICAL FUSION/FORAMINOTOMY  09/11/2011   Procedure: POSTERIOR CERVICAL FUSION/FORAMINOTOMY LEVEL 1;  Surgeon: Maeola Harman, MD;  Location: [facility];  Service: Neurosurgery;  Laterality: N/A;  Cervical Three-Four Posteior Cervical Fusion and Decompression.   POSTERIOR CERVICAL FUSION/FORAMINOTOMY N/A 05/14/2019   Procedure: Posterior cervical decompression/fusion Cervical four to Thoracic one with exploration/revision of Cervical threee-four fusion;  Surgeon: Maeola Harman, MD;  Location: [facility] OR;  Service: Neurosurgery;  Laterality: N/A;   TRIGGER FINGER RELEASE  12/19/2011   Procedure: RELEASE TRIGGER FINGER/A-1 PULLEY;  Surgeon: Nicki Reaper, MD;  Location:  SUR[facility]ERY C[facility];  Service: Orthopedics;  Laterality: Right;   TRIGGER FINGER RELEASE Left 12/31/2012   Procedure: RELEASE A-1 PULLEY LEFT THUMB;  Surgeon: Nicki Reaper, MD;  Location:  SURGERY CENTER;  Service: Orthopedics;  Laterality: Left;   WISDOM TOOTH EXTRACTION      Social History:  reports that she has never smoked. She has never used smokeless tobacco. She reports current alcohol use. She reports that she does not use drugs.  Allergies:  Allergies  Allergen Reactions   Savella [Milnacipran Hcl] Other (See Comments)    mania    Medications Prior to Admission  Medication Sig Dispense Refill   albuterol (VENTOLIN HFA) 108 (90 Base) MCG/ACT inhaler Inhale 2 puffs into the lungs every 6 (six) hours as needed for wheezing or shortness of breath. 32 g 0   ALPRAZolam (XANAX) 1 MG tablet Take 1 mg by mouth 3 (three) times daily.     amphetamine-dextroamphetamine (ADDERALL) 20 MG tablet Take 20 mg by mouth in the morning, at noon, in the evening, and at bedtime.      Ascorbic Acid (VITAMIN C) 500 MG tablet Take  1,000 mg by mouth daily.     aspirin 81 MG tablet Take 1 tablet (81 mg total) by mouth daily. 30 tablet    atorvastatin (LIPITOR) 20 MG tablet TAKE 1 TABLET BY MOUTH EVERY DAY IN THE EVENING (Patient taking differently: Take 20 mg by mouth daily.) 90 tablet 4   b complex vitamins tablet Take 1 tablet by mouth daily.     Blood Glucose Monitoring Suppl (ACCU-CHEK AVIVA PLUS) w/Device KIT As directed. 1 kit 0   Calcium Citrate (CITRACAL PO) Take 2 tablets by mouth daily.     Cholecalciferol (VITAMIN D3) 5000 UNITS TABS Take 10,000 Units by mouth in the morning and at bedtime.     Chromium Picolinate 500 MCG TABS Take 500 mcg by mouth daily.      diclofenac Sodium (VOLTAREN) 1 % GEL Apply 2 g topically 3 (three) times daily as needed (knee pain).  furosemide (LASIX) 40 MG tablet Take 2 tablets (80 mg total) by mouth 2 (two) times daily. (Patient taking differently: Take 40-80 mg by mouth See admin instructions. Take 80 mg in the morning and 40 mg in the evening) 360 tablet 3   [name] 1.5/30 1.5-30 MG-MCG tablet Take 1 tablet by mouth at bedtime.     levalbuterol (XOPENEX HFA) 45 MCG/ACT inhaler Inhale 2 puffs into the lungs every 6 (six) hours as needed for wheezing. 45 g 3   lidocaine (LIDODERM) 5 % PLACE 1 PATCH ONTO THE SKIN DAILY AS NEEDED FOR KNEE PAIN. REMOVE AND DISCARD PATCH WITHIN 12 HOURS OR AS DIRECTED BY DOCTOR. (Patient taking differently: Place 1 patch onto the skin as needed (knee pain).) 30 patch 0   losartan (COZAAR) 25 MG tablet Take 1 tablet (25 mg total) by mouth 2 (two) times daily. 180 tablet 2   LYRICA 300 MG capsule Take 300 mg by mouth 2 (two) times daily.      MAGNESIUM PO Take 500 mg by mouth daily.      Multiple Vitamin (MULTIVITAMIN) tablet Take 1 tablet by mouth daily.     NUEDEXTA 20-10 MG CAPS Take 1 capsule by mouth every 12 (twelve) hours.   12   Omega-3 Fatty Acids (FISH OIL) 1200 MG CAPS Take 2,400 mg by mouth daily.      potassium chloride SA (KLOR-CON M20) 20  MEQ tablet Take 1 tablet (20 mEq total) by mouth daily. 90 tablet 4   Sodium Chloride Flush (NORMAL SALINE FLUSH) 0.9 % SOLN Use to flush drain 1-2 times daily. 600 mL 1   VRAYLAR 6 MG CAPS Take 6 mg by mouth every evening.      Zinc 50 MG CAPS Take 50 mg by mouth in the morning and at bedtime.      Blood pressure 120/65, pulse 63, temperature 98 F (36.7 C), resp. rate 17, height 5\' 5"  (1.651 m), weight 112.7 kg, SpO2 98 %. Physical Exam: General: pleasant, WD, female who is laying in bed in NAD HEENT: head is normocephalic, atraumatic.  Sclera are noninjected.  Pupils equal and round. EOMs intact.  Ears and nose without any masses or lesions.  Mouth is pink and moist Heart: regular, rate, and rhythm.  Normal s1,s2. No obvious murmurs, gallops, or rubs noted.  Palpable radial and pedal pulses bilaterally Lungs: CTAB, no wheezes, rhonchi, or rales noted.  Respiratory effort nonlabored Abd: soft, ND, +BS, focal TTP over site of prior drain in LLQ which is open and draining blood tinged brown fluid with localized induration but no significant erythema. Drain site medial and cranial to this with eschar but no erythema, drainage or induration. Drain/incision sites otherwise healing well  MSK: all 4 extremities are symmetrical with no cyanosis, clubbing, or edema. Skin: warm and dry with no masses, lesions, or rashes Neuro: Cranial nerves 2-12 grossly intact, sensation is normal throughout Psych: A&Ox3 with an appropriate affect.    Results for orders placed or performed during the hospital encounter of 05/10/22 (from the past 48 hour(s))  CBC with Differential     Status: Abnormal   Collection Time: 05/10/22  2:48 PM  Result Value Ref Range   WBC 9.8 4.0 - 10.5 K/uL   RBC 5.21 (H) 3.87 - 5.11 MIL/uL   Hemoglobin 15.0 12.0 - 15.0 g/dL   HCT 47.0 92.9 - 57.4 %   MCV 87.7 80.0 - 100.0 fL   MCH 28.8 26.0 - 34.0 pg  MCHC 32.8 30.0 - 36.0 g/dL   RDW 16.1 09.6 - 04.5 %   Platelets 381 150 - 400  K/uL   nRBC 0.0 0.0 - 0.2 %   Neutrophils Relative % 79 %   Neutro Abs 7.9 (H) 1.7 - 7.7 K/uL   Lymphocytes Relative 13 %   Lymphs Abs 1.2 0.7 - 4.0 K/uL   Monocytes Relative 6 %   Monocytes Absolute 0.6 0.1 - 1.0 K/uL   Eosinophils Relative 1 %   Eosinophils Absolute 0.1 0.0 - 0.5 K/uL   Basophils Relative 0 %   Basophils Absolute 0.0 0.0 - 0.1 K/uL   Immature Granulocytes 1 %   Abs Immature Granulocytes 0.05 0.00 - 0.07 K/uL    Comment: Performed at [facility], [address], [place], [place] [address]  Comprehensive metabolic panel     Status: Abnormal   Collection Time: 05/10/22  2:48 PM  Result Value Ref Range   Sodium 136 135 - 145 mmol/L   Potassium 3.6 3.5 - 5.1 mmol/L   Chloride 99 98 - 111 mmol/L   CO2 23 22 - 32 mmol/L   Glucose, Bld 208 (H) 70 - 99 mg/dL    Comment: Glucose reference range applies only to samples taken after fasting for at least 8 hours.   BUN 15 6 - 20 mg/dL   Creatinine, Ser 1.91 0.44 - 1.00 mg/dL   Calcium 9.5 8.9 - 47.8 mg/dL   Total Protein 6.8 6.5 - 8.1 g/dL   Albumin 2.7 (L) 3.5 - 5.0 g/dL   AST 29 15 - 41 U/L   ALT 27 0 - 44 U/L   Alkaline Phosphatase 95 38 - 126 U/L   Total Bilirubin 0.9 0.3 - 1.2 mg/dL   GFR, Estimated >29 >56 mL/min    Comment: (NOTE) Calculated using the CKD-EPI Creatinine Equation (2021)    Anion gap 14 5 - 15    Comment: Performed at [facility], [address] [place], [place] [address]  Lipase, blood     Status: None   Collection Time: 05/10/22  2:48 PM  Result Value Ref Range   Lipase 27 11 - 51 U/L    Comment: Performed at [facility], [address], [place], [place] [address]  HIV Antibody (routine testing w rflx)     Status: None   Collection Time: 05/10/22  9:46 PM  Result Value Ref Range   HIV Screen 4th Generation wRfx Non Reactive Non Reactive    Comment: Performed at [facility], [address], [place], [place] [address]  Glucose, capillary     Status: Abnormal    Collection Time: 05/11/22 12:19 AM  Result Value Ref Range   Glucose-Capillary 121 (H) 70 - 99 mg/dL    Comment: Glucose reference range applies only to samples taken after fasting for at least 8 hours.  Glucose, capillary     Status: None   Collection Time: 05/11/22  4:29 AM  Result Value Ref Range   Glucose-Capillary 96 70 - 99 mg/dL    Comment: Glucose reference range applies only to samples taken after fasting for at least 8 hours.  Comprehensive metabolic panel     Status: Abnormal   Collection Time: 05/11/22  5:44 AM  Result Value Ref Range   Sodium 139 135 - 145 mmol/L   Potassium 3.6 3.5 - 5.1 mmol/L   Chloride 103 98 - 111 mmol/L   CO2 26 22 -  32 mmol/L   Glucose, Bld 122 (H) 70 - 99 mg/dL    Comment: Glucose reference range applies only to samples taken after fasting for at least 8 hours.   BUN 15 6 - 20 mg/dL   Creatinine, Ser 1.61 0.44 - 1.00 mg/dL   Calcium 9.2 8.9 - 09.6 mg/dL   Total Protein 6.2 (L) 6.5 - 8.1 g/dL   Albumin 2.3 (L) 3.5 - 5.0 g/dL   AST 33 15 - 41 U/L   ALT 29 0 - 44 U/L   Alkaline Phosphatase 79 38 - 126 U/L   Total Bilirubin 0.8 0.3 - 1.2 mg/dL   GFR, Estimated >04 >54 mL/min    Comment: (NOTE) Calculated using the CKD-EPI Creatinine Equation (2021)    Anion gap 10 5 - 15    Comment: Performed at [facility], [address], [place], [place] [address]  CBC with Differential/Platelet     Status: None   Collection Time: 05/11/22  5:44 AM  Result Value Ref Range   WBC 6.5 4.0 - 10.5 K/uL   RBC 4.75 3.87 - 5.11 MIL/uL   Hemoglobin 13.6 12.0 - 15.0 g/dL   HCT 91.4 78.2 - 95.6 %   MCV 87.4 80.0 - 100.0 fL   MCH 28.6 26.0 - 34.0 pg   MCHC 32.8 30.0 - 36.0 g/dL   RDW 21.3 08.6 - 57.8 %   Platelets 362 150 - 400 K/uL   nRBC 0.0 0.0 - 0.2 %   Neutrophils Relative % 61 %   Neutro Abs 4.0 1.7 - 7.7 K/uL   Lymphocytes Relative 23 %   Lymphs Abs 1.5 0.7 - 4.0 K/uL   Monocytes Relative 10 %   Monocytes Absolute 0.6 0.1 - 1.0 K/uL    Eosinophils Relative 5 %   Eosinophils Absolute 0.3 0.0 - 0.5 K/uL   Basophils Relative 1 %   Basophils Absolute 0.0 0.0 - 0.1 K/uL   Immature Granulocytes 0 %   Abs Immature Granulocytes 0.02 0.00 - 0.07 K/uL    Comment: Performed at [facility], [address] [place], [place] [address]  Magnesium     Status: None   Collection Time: 05/11/22  5:44 AM  Result Value Ref Range   Magnesium 1.8 1.7 - 2.4 mg/dL    Comment: Performed at [facility], [address] [place], [place] [address]   CT ABDOMEN PELVIS W CONTRAST  Result Date: 05/10/2022 CLINICAL DATA:  History of diverticulitis complicated by postoperative fluid, status post drainage placement. Now presenting with inadvertent drain removal. EXAM: CT ABDOMEN AND PELVIS WITH CONTRAST TECHNIQUE: Multidetector CT imaging of the abdomen and pelvis was performed using the standard protocol following bolus administration of intravenous contrast. RADIATION DOSE REDUCTION: This exam was performed according to the departmental dose-optimization program which includes automated exposure control, adjustment of the mA and/or kV according to patient size and/or use of iterative reconstruction technique. CONTRAST:  75mL OMNIPAQUE IOHEXOL 350 MG/ML SOLN COMPARISON:  CT abdomen and pelvis dated 01/01/2022 FINDINGS: Lower chest: Scattered left lower lobe cysts, unchanged. No pleural effusion or pneumothorax demonstrated. Partially imaged heart size is normal. Coronary artery calcifications. Hepatobiliary: No focal hepatic lesions. No intra or extrahepatic biliary ductal dilation. Cholelithiasis. Pancreas: No focal lesions or main ductal dilation. Spleen: Normal in size without focal abnormality. Adrenals/Urinary Tract: No adrenal nodules. No suspicious renal mass, calculi or hydronephrosis. No focal bladder wall thickening. Stomach/Bowel: Normal appearance of the stomach. Mural thickening of  the sigmoid colon with fistulization to midline abdominal  collections as below. Normal appendix. Vascular/Lymphatic: Aortic atherosclerosis. No enlarged abdominal or pelvic lymph nodes. Reproductive: No adnexal masses. Other: Previously noted drainage catheters have been removed, including left lower quadrant approach drain with increased size of gas-containing midline lower abdomen irregular collection measuring 7.5 x 4.3 cm (3:73), previously 2.2 x 2.2 cm, which demonstrates fistulous connection with the sigmoid colon. An irregular collection in communication with this collection posteriorly extends into the rectouterine pouch, measuring 6.8 x 3.2 cm (3:72), previously 3.5 x 3.5 cm. Musculoskeletal: No acute or abnormal lytic or blastic osseous lesions. Multilevel degenerative changes of the partially imaged thoracic and lumbar spine. Subcutaneous stranding and emphysema along the left anterior lower abdominal wall, where a drainage catheter was seen previously. Interval development of a gas-containing fluid collection along this tract within the left abdominal musculature measuring 2.8 x 2.0 cm (3:67). IMPRESSION: 1. 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 An irregular collection in communication posteriorly extends into the rectouterine pouch, measuring 6.8 x 3.2 cm, previously 3.5 x 3.5 cm. 2. Subcutaneous stranding and emphysema along the left anterior lower abdominal wall, where a drainage catheter was seen previously. Interval development of a gas-containing fluid collection along this tract within the left abdominal musculature measuring 2.8 x 2.0 cm. 3. Aortic Atherosclerosis (ICD10-I70.0). Electronically Signed   By: Agustin Cree M.D.   On: 05/10/2022 18:52      Assessment/Plan H/o laparoscopic peritoneal lavage with drain placement on  12/11/2021 Dr. Dossie Der for diverticulitis  H/o multiple prior IR drain placements for IAA. Last one removed 3/20 Recurrent IAA Lower abdominal midline: 7.5 x 4.3 cm, previously 2.2 x 2.2 cm with  fistulous connection with the inflamed sigmoid colon Communicating collection extending into rectouterine pouch 6.8 x 3.2 cm  - last drain which had prev been in communication with the colon was removed 04/18/2022 after CT drain study showing no residual abscess or fistula. 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 Recommend IR drainage for now and at this point will likely require surgical resection this admission which would entail colostomy creation. She has history of CHF and last echo 2022. She says she is due for cardiology follow up. She may need preoperative cardiology clearance. We will follow  FEN: NPO for IR eval. Can have CLD after ID: [id number] VTE: heparin subq  Chronic combined systolic and diastolic CHF (congestive heart failure) - last echo 2022 with LVEF 60-65% Essential hypertension, benign OSA on CPAP Type 2 diabetes mellitus with other specified complication Bipolar disorder Morbid obesity  I reviewed ED provider notes, hospitalist notes, last 24 h vitals and pain scores, last 48 h intake and output, last 24 h labs and trends, and last 24 h imaging results.  Eric Form, [facility] 05/11/2022, 7:49 AM Please see Amion for pager number during day hours 7:00am-4:30pm

## 2022-05-11 NOTE — Consult Note (Addendum)
Chief Complaint: Patient was seen in consultation today for abdominal abscess  Referring Physician(s): Carl Best, PA-C  Supervising Physician: Richarda Overlie  Patient Status: [facility] - In-pt  History of Present Illness: [name] is a 52 y.o. with history of morbid obesity, HTN, HLD, anxiety, IBS wh presented to [facility] ED 12/11/21 due to abdominal pain. She underwent laparoscopic abdominal washout due to pneumoperitoneum for suspected ulcer. She subsequently underwent drain placement x4 12/19/21 for post operative fluid collections.  She maintained all 4 drain through 01/03/22 at which time 3 of the drains were removed due to improvement in output without s/s of fistula by contrast injection.  Her remaining left lower quadrant drain was removed 04/18/22 after most recent contrast injection did not show persisting fistula.  Unfortunately she returns today with increased abdominal pain and feculent-appearing drainage from her prior drain tract.   IR consulted for drain replacement.   Case reviewed by Dr. Lowella Dandy who approves patient for procedure today.   Past Medical History:  Diagnosis Date   Acute systolic congestive heart failure    Agoraphobia    Anxiety    panic attacks   Arthritis    osteoarthritis bilal. knees   Asthma    no per PFT 7/13; reports does not have asthma   Bipolar disorder    Carpal tunnel syndrome of left wrist 05/2011   Being evaluated for MS   Cor pulmonale    Depression    History of pleural effusion    Hyperlipidemia    Hypertension    Hypoxemia    history of - no home O2   IBS (irritable bowel syndrome)    Morbid obesity    Obesity hypoventilation syndrome    Perforation of sigmoid colon due to diverticulitis 12/13/2021   Pneumonia    Pre-diabetes    Prediabetes    Seasonal allergies    current runny nose   Seizures    febrile seizure x 1 as a child. Several times   Sleep apnea sleep study 07/02/2010   uses CPAP nightly   Spinal stenosis in  cervical region    Urinary incontinence     Past Surgical History:  Procedure Laterality Date   APPLICATION OF INTRAOPERATIVE CT SCAN N/A 05/14/2019   Procedure: APPLICATION OF INTRAOPERATIVE CT SCAN;  Surgeon: Maeola Harman, MD;  Location: [facility] OR;  Service: Neurosurgery;  Laterality: N/A;   CARPAL TUNNEL RELEASE  06/27/2011   Procedure: CARPAL TUNNEL RELEASE;  Surgeon: Nicki Reaper, MD;  Location: [facility];  Service: Orthopedics;  Laterality: Left;   CARPAL TUNNEL RELEASE  12/19/2011   Procedure: CARPAL TUNNEL RELEASE;  Surgeon: Nicki Reaper, MD;  Location: [facility];  Service: Orthopedics;  Laterality: Right;   INCISION AND DRAINAGE ABSCESS Left 08/27/2017   Procedure: INCISION AND DRAINAGE LEFT BUTTOCK  ABSCESS;  Surgeon: Glenna Fellows, MD;  Location: [facility];  Service: General;  Laterality: Left;   IR RADIOLOGIST EVAL & MGMT  03/16/2022   IR RADIOLOGIST EVAL & MGMT  04/18/2022   IR SINUS/FIST TUBE CHK-NON GI  01/03/2022   IR SINUS/FIST TUBE CHK-NON GI  01/03/2022   IR SINUS/FIST TUBE CHK-NON GI  01/03/2022   IR SINUS/FIST TUBE CHK-NON GI  01/03/2022   IRRIGATION AND DEBRIDEMENT BUTTOCKS Left 08/30/2017   Procedure: IRRIGATION AND DEBRIDEMENT RE-EXCISION OF LEFT SUBCUTANEOUS BUTTOCKS ABCESS;  Surgeon: Rodman Pickle, MD;  Location: [facility];  Service: General;  Laterality: Left;   LAPAROSCOPY N/A 12/11/2021  Procedure: LAPAROSCOPIC PERITONEAL LAVAGE WITH DRAIN PLACEMENT;  Surgeon: Stechschulte, Paul J, MD;  Location: [facility];  Laterality: N/A;   MASS EXCISION  08/07/2010   right index   POSTERIOR CERVICAL FUSION/FORAMINOTOMY  09/11/2011   Procedure: POSTERIOR CERVICAL FUSION/FORAMINOTOMY LEVEL 1;  Surgeon: Maeola Harman, MD;  Location: [facility];  Service: Neurosurgery;  Laterality: N/A;  Cervical Three-Four Posteior Cervical Fusion and Decompression.   POSTERIOR CERVICAL FUSION/FORAMINOTOMY N/A 05/14/2019   Procedure: Posterior cervical  decompression/fusion Cervical four to Thoracic one with exploration/revision of Cervical threee-four fusion;  Surgeon: Maeola Harman, MD;  Location: [facility] OR;  Service: Neurosurgery;  Laterality: N/A;   TRIGGER FINGER RELEASE  12/19/2011   Procedure: RELEASE TRIGGER FINGER/A-1 PULLEY;  Surgeon: Nicki Reaper, MD;  Location: [facility];  Service: Orthopedics;  Laterality: Right;   TRIGGER FINGER RELEASE Left 12/31/2012   Procedure: RELEASE A-1 PULLEY LEFT THUMB;  Surgeon: Nicki Reaper, MD;  Location: [facility];  Service: Orthopedics;  Laterality: Left;   WISDOM TOOTH EXTRACTION      Allergies: Savella [milnacipran hcl]  Medications: Prior to Admission medications   Medication Sig Start Date End Date Taking? Authorizing Provider  albuterol (VENTOLIN HFA) 108 (90 Base) MCG/ACT inhaler Inhale 2 puffs into the lungs every 6 (six) hours as needed for wheezing or shortness of breath. 10/06/20   Oretha Milch, MD  ALPRAZolam Prudy Feeler) 1 MG tablet Take 1 mg by mouth 3 (three) times daily.    [provider]  amphetamine-dextroamphetamine (ADDERALL) 20 MG tablet Take 20 mg by mouth in the morning, at noon, in the evening, and at bedtime.     [provider]  Ascorbic Acid (VITAMIN C) 500 MG tablet Take 1,000 mg by mouth daily.    [provider]  aspirin 81 MG tablet Take 1 tablet (81 mg total) by mouth daily. 05/21/19   Costella, Darci Current, PA-C  atorvastatin (LIPITOR) 20 MG tablet TAKE 1 TABLET BY MOUTH EVERY DAY IN THE EVENING Patient taking differently: Take 20 mg by mouth daily. 03/15/21   [name], MD  b complex vitamins tablet Take 1 tablet by mouth daily.    [provider]  Blood Glucose Monitoring Suppl (ACCU-CHEK AVIVA PLUS) w/Device KIT As directed. 11/14/21   [place], Betty G, MD  Calcium Citrate (CITRACAL PO) Take 2 tablets by mouth daily.    [provider]  Cholecalciferol (VITAMIN D3) 5000 UNITS TABS Take 10,000  Units by mouth in the morning and at bedtime.    [provider]  Chromium Picolinate 500 MCG TABS Take 500 mcg by mouth daily.     [provider]  diclofenac Sodium (VOLTAREN) 1 % GEL Apply 2 g topically 3 (three) times daily as needed (knee pain).    [provider]  furosemide (LASIX) 40 MG tablet Take 2 tablets (80 mg total) by mouth 2 (two) times daily. Patient taking differently: Take 40-80 mg by mouth See admin instructions. Take 80 mg in the morning and 40 mg in the evening 12/05/21   [name], MD  [name] 1.5/30 1.5-30 MG-MCG tablet Take 1 tablet by mouth at bedtime. 08/03/19   [provider]  levalbuterol Pauline Aus HFA) 45 MCG/ACT inhaler Inhale 2 puffs into the lungs every 6 (six) hours as needed for wheezing. 10/14/20   Oretha Milch, MD  lidocaine (LIDODERM) 5 % PLACE 1 PATCH ONTO THE SKIN DAILY AS NEEDED FOR KNEE PAIN. REMOVE AND DISCARD  PATCH WITHIN 12 HOURS OR AS DIRECTED BY DOCTOR. Patient taking differently: Place 1 patch onto the skin as needed (knee pain). 05/22/21   [place], Betty G, MD  losartan (COZAAR) 25 MG tablet Take 1 tablet (25 mg total) by mouth 2 (two) times daily. 12/05/21   [name], MD  LYRICA 300 MG capsule Take 300 mg by mouth 2 (two) times daily.  03/26/11   [provider]  MAGNESIUM PO Take 500 mg by mouth daily.     [provider]  Multiple Vitamin (MULTIVITAMIN) tablet Take 1 tablet by mouth daily.    [provider]  NUEDEXTA 20-10 MG CAPS Take 1 capsule by mouth every 12 (twelve) hours.  09/27/16   [provider]  Omega-3 Fatty Acids (FISH OIL) 1200 MG CAPS Take 2,400 mg by mouth daily.     [provider]  ondansetron (ZOFRAN-ODT) 4 MG disintegrating tablet Take 4 mg by mouth every 8 (eight) hours as needed for nausea. 05/08/22 05/15/22  [provider]  potassium chloride SA (KLOR-CON M20) 20 MEQ tablet Take 1 tablet (20 mEq total) by mouth daily. 04/14/21    [name], MD  Sodium Chloride Flush (NORMAL SALINE FLUSH) 0.9 % SOLN Use to flush drain 1-2 times daily. 02/16/22     VRAYLAR 6 MG CAPS Take 6 mg by mouth every evening.  09/03/16   [provider]  Zinc 50 MG CAPS Take 50 mg by mouth in the morning and at bedtime.    [provider]     Family History  Problem Relation Age of Onset   Asthma Mother    Allergic rhinitis Mother    Multiple sclerosis Mother    Asthma Maternal Grandmother    Breast cancer Neg Hx     Social History   Socioeconomic History   Marital status: Widowed    Spouse name: Not on file   Number of children: Not on file   Years of education: Not on file   Highest education level: Bachelor's degree (e.g., BA, AB, BS)  Occupational History   Occupation: currently unemployed  Tobacco Use   Smoking status: Never   Smokeless tobacco: Never  Vaping Use   Vaping Use: Never used  Substance and Sexual Activity   Alcohol use: Yes    Comment: rare   Drug use: No   Sexual activity: Not Currently  Other Topics Concern   Not on file  Social History Narrative   Lives in [place]         Social Determinants of Health   Financial Resource Strain: Low Risk  (04/02/2022)   Overall Financial Resource Strain (CARDIA)    Difficulty of Paying Living Expenses: Not hard at all  Food Insecurity: No Food Insecurity (05/10/2022)   Hunger Vital Sign    Worried About Running Out of Food in the Last Year: Never true    Ran Out of Food in the Last Year: Never true  Transportation Needs: Unmet Transportation Needs (05/10/2022)   PRAPARE - Administrator, Civil Service (Medical): Yes    Lack of Transportation (Non-Medical): No  Physical Activity: Insufficiently Active (04/02/2022)   Exercise Vital Sign    Days of Exercise per Week: 2 days    Minutes of Exercise per Session: 30 min  Stress: No Stress Concern Present (04/02/2022)   [employer] of Occupational Health - Occupational Stress  Questionnaire    Feeling of Stress : Not at all  Social Connections: Moderately  Integrated (04/02/2022)   Social Connection and Isolation Panel [NHANES]    Frequency of Communication with Friends and Family: More than three times a week    Frequency of Social Gatherings with Friends and Family: More than three times a week    Attends Religious Services: More than 4 times per year    Active Member of [employer] or Organizations: Yes    Attends Banker Meetings: More than 4 times per year    Marital Status: Widowed     Review of Systems: A 12 point ROS discussed and pertinent positives are indicated in the HPI above.  All other systems are negative.  Review of Systems  Constitutional:  Negative for fatigue and fever.  Respiratory:  Negative for cough and shortness of breath.   Cardiovascular:  Negative for chest pain.  Gastrointestinal:  Positive for abdominal pain. Negative for nausea and vomiting.  Musculoskeletal:  Negative for back pain.  Psychiatric/Behavioral:  Negative for behavioral problems and confusion.     Vital Signs: BP 127/69 (BP Location: Right Arm)   Pulse 65   Temp (!) 97.5 F (36.4 C) (Oral)   Resp 16   Ht 5\' 5"  (1.651 m)   Wt 248 lb 7.3 oz (112.7 kg)   SpO2 100%   BMI 41.35 kg/m   Physical Exam Vitals and nursing note reviewed.  Constitutional:      General: She is not in acute distress.    Appearance: She is well-developed. She is not ill-appearing.  Cardiovascular:     Rate and Rhythm: Normal rate and regular rhythm.  Pulmonary:     Effort: Pulmonary effort is normal. No respiratory distress.     Breath sounds: Normal breath sounds.  Abdominal:     General: Abdomen is flat.  Skin:    General: Skin is warm and dry.  Neurological:     General: No focal deficit present.     Mental Status: She is alert and oriented to person, place, and time.  Psychiatric:        Mood and Affect: Mood normal.        Behavior: Behavior normal.      MD  Evaluation Airway: WNL Heart: WNL Abdomen: WNL Chest/ Lungs: WNL ASA  Classification: 3 Mallampati/Airway Score: Two   Imaging: CT ABDOMEN PELVIS W CONTRAST  Result Date: 05/10/2022 CLINICAL DATA:  History of diverticulitis complicated by postoperative fluid, status post drainage placement. Now presenting with inadvertent drain removal. EXAM: CT ABDOMEN AND PELVIS WITH CONTRAST TECHNIQUE: Multidetector CT imaging of the abdomen and pelvis was performed using the standard protocol following bolus administration of intravenous contrast. RADIATION DOSE REDUCTION: This exam was performed according to the departmental dose-optimization program which includes automated exposure control, adjustment of the mA and/or kV according to patient size and/or use of iterative reconstruction technique. CONTRAST:  75mL OMNIPAQUE IOHEXOL 350 MG/ML SOLN COMPARISON:  CT abdomen and pelvis dated 01/01/2022 FINDINGS: Lower chest: Scattered left lower lobe cysts, unchanged. No pleural effusion or pneumothorax demonstrated. Partially imaged heart size is normal. Coronary artery calcifications. Hepatobiliary: No focal hepatic lesions. No intra or extrahepatic biliary ductal dilation. Cholelithiasis. Pancreas: No focal lesions or main ductal dilation. Spleen: Normal in size without focal abnormality. Adrenals/Urinary Tract: No adrenal nodules. No suspicious renal mass, calculi or hydronephrosis. No focal bladder wall thickening. Stomach/Bowel: Normal appearance of the stomach. Mural thickening of the sigmoid colon with fistulization to midline abdominal collections as below. Normal appendix. Vascular/Lymphatic: Aortic atherosclerosis. No enlarged abdominal or pelvic  lymph nodes. Reproductive: No adnexal masses. Other: Previously noted drainage catheters have been removed, including left lower quadrant approach drain with increased size of gas-containing midline lower abdomen irregular collection measuring 7.5 x 4.3 cm (3:73),  previously 2.2 x 2.2 cm, which demonstrates fistulous connection with the sigmoid colon. An irregular collection in communication with this collection posteriorly extends into the rectouterine pouch, measuring 6.8 x 3.2 cm (3:72), previously 3.5 x 3.5 cm. Musculoskeletal: No acute or abnormal lytic or blastic osseous lesions. Multilevel degenerative changes of the partially imaged thoracic and lumbar spine. Subcutaneous stranding and emphysema along the left anterior lower abdominal wall, where a drainage catheter was seen previously. Interval development of a gas-containing fluid collection along this tract within the left abdominal musculature measuring 2.8 x 2.0 cm (3:67). IMPRESSION: 1. 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 An irregular collection in communication posteriorly extends into the rectouterine pouch, measuring 6.8 x 3.2 cm, previously 3.5 x 3.5 cm. 2. Subcutaneous stranding and emphysema along the left anterior lower abdominal wall, where a drainage catheter was seen previously. Interval development of a gas-containing fluid collection along this tract within the left abdominal musculature measuring 2.8 x 2.0 cm. 3. Aortic Atherosclerosis (ICD10-I70.0). Electronically Signed   By: Agustin Cree M.D.   On: 05/10/2022 18:52   DG Sinus/Fist Tube Chk-Non GI  Result Date: 04/18/2022 CLINICAL DATA:  Drain follow-up. Briefly, 52 year old female with a history of perforated diverticulitis with multiple EXAM: ABSCESS DRAIN INJECTION/ABSCESSOGRAM COMPARISON:  CT AP, 01/01/2022. Multiple IR fluoroscopy examinations, most recently 03/16/2022. CONTRAST:  10 mL Omnipaque 300-administered via the existing percutaneous drain. FLUOROSCOPY TIME:  Fluoroscopic dose; 9.9 mGy TECHNIQUE: The patient  was positioned supine on the fluoroscopy table. A preprocedural spot fluoroscopic image was obtained of the LEFT pelvis and the existing percutaneous drainage catheter. Multiple spot fluoroscopic and radiographic images were obtained following the injection of a small amount of contrast via the existing percutaneous drainage catheter. FINDINGS: *Stable positioning of LEFT lower quadrant, anterior-approach pelvic drainage catheter. *Patent catheter, without residual abscess fluid collection. *No discrete fistula on this evaluation. IMPRESSION: No residual abscess fluid collection or discrete fistula on this evaluation. The LEFT lower quadrant drain was therefore removed Roanna Banning, MD [facility] Radiology Electronically Signed   By: Roanna Banning M.D.   On: 04/18/2022 14:26   IR Radiologist Eval & Mgmt  Result Date: 04/18/2022 EXAM: ESTABLISHED PATIENT OFFICE VISIT CHIEF COMPLAINT: See below HISTORY OF PRESENT ILLNESS: See below REVIEW OF SYSTEMS: See below PHYSICAL EXAMINATION: See below ASSESSMENT AND PLAN: Please refer to completed note in the electronic medical record on [facility] Roanna Banning, MD [facility] Radiology Electronically Signed   By: Roanna Banning M.D.   On: 04/18/2022 13:59    Labs:  CBC: Recent Labs    01/03/22 0301 01/15/22 1438 05/10/22 1448 05/11/22 0544  WBC 9.4 7.9 9.8 6.5  HGB 9.5* 12.1 15.0 13.6  HCT 29.9* 37.7 45.7 41.5  PLT 376 541.0* 381 362    COAGS: No results for input(s): "INR", "APTT" in the last 8760 hours.  BMP: Recent Labs    01/02/22 0530 01/03/22 0301 01/15/22 1438 05/10/22 1448 05/11/22 0544  NA 138 140 142 136 139  K 4.3 4.4 3.8 3.6 3.6  CL 100  96* 101 99 103  CO2 31 30 26 23 26   GLUCOSE 177* 181* 140* 208* 122*  BUN 13 10 10 15 15   CALCIUM 8.5* 9.4 9.9 9.5 9.2  CREATININE 0.60 0.72 0.71 0.77 0.76  GFRNONAA >60 >60  --  >60 >60     LIVER FUNCTION TESTS: Recent Labs    12/28/21 0948 01/01/22 0517 05/10/22 1448 05/11/22 0544  BILITOT <0.1* 0.2* 0.9 0.8  AST 34 29 29 33  ALT 22 22 27 29   ALKPHOS 92 98 95 79  PROT 6.1* 6.0* 6.8 6.2*  ALBUMIN 1.7* 1.5* 2.7* 2.3*    TUMOR MARKERS: No results for input(s): "AFPTM", "CEA", "CA199", "CHROMGRNA" in the last 8760 hours.  Assessment and Plan: Intra-abdominal fluid collection Patient with active drainage from a prior drain tract likely related to ongoing vs. New fistula. She now has new intra-abdominal abscess collections.   Discussed with surgery team.  Anticipate patient will need inpatient OR due to failed management with drains alone previously. Drain replacement requested for temporization and source control.  Case reviewed and approved by Dr. Lowella Dandy.  She has been NPO.  INR pending.  Hep SQ last dosed at 630.   Discussed with patient who is aware that given CT results, she has redeveloped abscesses which warrant drainage.  She is understanding of this.  She is aware the drain will remain in place per the surgery/IR team and may be required at discharge.   Risks and benefits discussed with the patient including bleeding, infection, damage to adjacent structures, bowel perforation/fistula connection, and sepsis.  All of the patient's questions were answered, patient is agreeable to proceed. Consent signed and in chart.  Thank you for this interesting consult.  I greatly enjoyed meeting [name] and look forward to participating in their care.  A copy of this report was sent to the requesting provider on this date.  Electronically Signed: Hoyt Koch, PA 05/11/2022, 11:48 AM   I spent a total of 40 Minutes    in face to face in clinical consultation, greater than 50% of which was counseling/coordinating care for intra-abdominal fluid collections.

## 2022-05-11 NOTE — Procedures (Signed)
Interventional Radiology Procedure:   Indications: Recurrent abdominal abscess  Procedure: CT guided placement of lower abdominal abscess drain  Findings: Abscess in lower abdomen.  Placed 12 Fr drain and removed 80 ml of yellow/green purulent fluid.  Bloody fluid draining from old drain site.   Complications: None     EBL: Minimal  Plan: Fluid sent for culture.  Follow output and routine flushes.   Jessica Karis R. Lowella Dandy, MD  Pager: [phone]

## 2022-05-11 NOTE — Progress Notes (Addendum)
PROGRESS NOTE    [name]  IDP:[id number] DOB: 1970/05/25 DOA: 05/10/2022 PCP: [place], Betty G, MD    Chief Complaint  Patient presents with   Abdominal Pain    Brief Narrative:  Patient is a pleasant 52 year old female history of morbid obesity, diet-controlled type 2 diabetes, hypertension, OSA on CPAP, bipolar disorder, history of perforated diverticulitis status post multiple drains November 2023 presented to the ED with increasing abdominal pain and drainage from prior drain site for the past 3 to 5 days.  Patient noted to have had last intra-abdominal drain pulled at the end of March.  Patient seen in the ED CT abdomen and pelvis done concerning for abscesses as well as fistulous connection with inflamed sigmoid colon.  Patient placed empirically on IV antibiotics.  General surgery consulted.   Assessment & Plan:   Principal Problem:   Intra-abdominal abscess Active Problems:   Morbid obesity   Bipolar disorder   Type 2 diabetes mellitus with other specified complication   OSA on CPAP   Essential hypertension, benign   Chronic combined systolic and diastolic CHF (congestive heart failure)  #1 intra-abdominal abscesses/fistulous connection with sigmoid colon -Patient with prior history of perforated diverticulitis status post multiple drain placement in November 2023, last drain removed end of March presented with a 3 to 5-day history of abdominal pain and increased drainage from prior drainage site. -CT abdomen and pelvis done concerning for abscesses and fistulous formation. -Admitting physician discussed with patient that patient may need surgery at this time for sigmoid colectomy however defer to general surgeon. -Patient seen in consultation by general surgery this morning, who recommended IR evaluation for drain placement which was done earlier on today, and recommending that patient will likely eventually need colectomy. -Patient also seen in consultation by  cardiology for cardiac clearance. -Patient started on clears. -Continue empiric IV Zosyn, IV fluids. -Per general surgery.  2.  Chronic combined systolic and diastolic CHF, POA -Currently euvolemic. -Patient was n.p.o. this morning, currently on clears, BP noted to be soft this morning and as such Lasix held today and will be resumed tomorrow. -ARB also held today and will be resumed tomorrow.  3.  Hypertension -BP soft, patient noted to be n.p.o. early on today currently just on clear liquids. -Hold Lasix and Cozaar today. -Gentle hydration.  4.  Type 2 diabetes mellitus, diet controlled -Hemoglobin A1c noted at 5.9 (11/14/2021) -CBG noted at 96 this morning. -Check a hemoglobin A1c. -SSI.  5.  OSA -CPAP nightly.  6.  Morbid obesity -Lifestyle modification. -Outpatient follow-up PCP.  7.  Bipolar disorder -Continue home regimen psych medications.  8.  Hyperlipidemia -Continue statin.   DVT prophylaxis: Heparin>>>> Lovenox Code Status: Full Family Communication: Updated patient.  No family at bedside. Disposition: Likely home when clinically improved, cleared by general surgery.  Status is: Inpatient Remains inpatient appropriate because: Severity of illness   Consultants:  General surgery: Dr. Dwain Sarna 05/11/2022 Interventional radiology: Dr. Lowella Dandy 05/11/2022 Cardiology 05/11/2022 Wound care RN: Ria Bush, RN 05/11/2022  Procedures:  CT abdomen and pelvis 05/10/2022 CT-guided placement of lower abdominal abscess drain per IR: Dr. Lowella Dandy 05/11/2022  Antimicrobials:  IV Zosyn 05/10/2022>>>>   Subjective: Patient laying in bed.  Complains of abdominal pain around prior drain site.  No chest pain.  No shortness of breath.  Passing gas.  Positive flatus.  Surgical PA at bedside.  Objective: Vitals:   05/11/22 1320 05/11/22 1325 05/11/22 1330 05/11/22 1434  BP: (!) 145/91 136/81 (!) 141/77 [name])  108/58  Pulse: 66 64 64 60  Resp: 18 18 20 16   Temp:    (!) 97.2 F  (36.2 C)  TempSrc:    Axillary  SpO2: 97% 98% 95% 95%  Weight:      Height:        Intake/Output Summary (Last 24 hours) at 05/11/2022 1716 Last data filed at 05/11/2022 1521 Gross per 24 hour  Intake 732.32 ml  Output 380 ml  Net 352.32 ml   Filed Weights   05/10/22 1446 05/10/22 2120  Weight: 122 kg 112.7 kg    Examination:  General exam: Appears calm and comfortable  Respiratory system: Clear to auscultation.  No wheezes, no crackles, no rhonchi.  Fair air movement.  Speaking in full sentences.  Respiratory effort normal. Cardiovascular system: S1 & S2 heard, RRR. No JVD, murmurs, rubs, gallops or clicks. No pedal edema. Gastrointestinal system: Abdomen is nondistended, soft and tenderness to palpation around site of prior drain in left lower quadrant which is open with some sanguinous drainage and brown fluid and some localized induration.  No significant erythema.  Drain site medial with some eschar no erythema no drainage no induration.   Central nervous system: Alert and oriented. No focal neurological deficits. Extremities: Symmetric 5 x 5 power. Skin: No rashes, lesions or ulcers Psychiatry: Judgement and insight appear normal. Mood & affect appropriate.     Data Reviewed: I have personally reviewed following labs and imaging studies  CBC: Recent Labs  Lab 05/10/22 1448 05/11/22 0544  WBC 9.8 6.5  NEUTROABS 7.9* 4.0  HGB 15.0 13.6  HCT 45.7 41.5  MCV 87.7 87.4  PLT 381 362    Basic Metabolic Panel: Recent Labs  Lab 05/10/22 1448 05/11/22 0544  NA 136 139  K 3.6 3.6  CL 99 103  CO2 23 26  GLUCOSE 208* 122*  BUN 15 15  CREATININE 0.77 0.76  CALCIUM 9.5 9.2  MG  --  1.8    GFR: Estimated Creatinine Clearance: 103 mL/min (by C-G formula based on SCr of 0.76 mg/dL).  Liver Function Tests: Recent Labs  Lab 05/10/22 1448 05/11/22 0544  AST 29 33  ALT 27 29  ALKPHOS 95 79  BILITOT 0.9 0.8  PROT 6.8 6.2*  ALBUMIN 2.7* 2.3*    CBG: Recent  Labs  Lab 05/11/22 0019 05/11/22 0429 05/11/22 0905 05/11/22 1130 05/11/22 1641  GLUCAP 121* 96 122* 131* 182*     Recent Results (from the past 240 hour(s))  Aerobic/Anaerobic Culture w Gram Stain (surgical/deep wound)     Status: None (Preliminary result)   Collection Time: 05/11/22  1:30 PM   Specimen: Abscess  Result Value Ref Range Status   Specimen Description ABSCESS  Final   Special Requests   Final    LLQ Performed at [facility], [address], [place], [place] [address]    Gram Stain PENDING  Incomplete   Culture PENDING  Incomplete   Report Status PENDING  Incomplete         Radiology Studies: CT ABDOMEN PELVIS W CONTRAST  Result Date: 05/10/2022 CLINICAL DATA:  History of diverticulitis complicated by postoperative fluid, status post drainage placement. Now presenting with inadvertent drain removal. EXAM: CT ABDOMEN AND PELVIS WITH CONTRAST TECHNIQUE: Multidetector CT imaging of the abdomen and pelvis was performed using the standard protocol following bolus administration of intravenous contrast. RADIATION DOSE REDUCTION: This exam was performed according to the departmental dose-optimization program which includes automated exposure control, adjustment of  the mA and/or kV according to patient size and/or use of iterative reconstruction technique. CONTRAST:  75mL OMNIPAQUE IOHEXOL 350 MG/ML SOLN COMPARISON:  CT abdomen and pelvis dated 01/01/2022 FINDINGS: Lower chest: Scattered left lower lobe cysts, unchanged. No pleural effusion or pneumothorax demonstrated. Partially imaged heart size is normal. Coronary artery calcifications. Hepatobiliary: No focal hepatic lesions. No intra or extrahepatic biliary ductal dilation. Cholelithiasis. Pancreas: No focal lesions or main ductal dilation. Spleen: Normal in size without focal abnormality. Adrenals/Urinary Tract: No adrenal nodules. No suspicious renal mass, calculi or hydronephrosis. No focal bladder wall  thickening. Stomach/Bowel: Normal appearance of the stomach. Mural thickening of the sigmoid colon with fistulization to midline abdominal collections as below. Normal appendix. Vascular/Lymphatic: Aortic atherosclerosis. No enlarged abdominal or pelvic lymph nodes. Reproductive: No adnexal masses. Other: Previously noted drainage catheters have been removed, including left lower quadrant approach drain with increased size of gas-containing midline lower abdomen irregular collection measuring 7.5 x 4.3 cm (3:73), previously 2.2 x 2.2 cm, which demonstrates fistulous connection with the sigmoid colon. An irregular collection in communication with this collection posteriorly extends into the rectouterine pouch, measuring 6.8 x 3.2 cm (3:72), previously 3.5 x 3.5 cm. Musculoskeletal: No acute or abnormal lytic or blastic osseous lesions. Multilevel degenerative changes of the partially imaged thoracic and lumbar spine. Subcutaneous stranding and emphysema along the left anterior lower abdominal wall, where a drainage catheter was seen previously. Interval development of a gas-containing fluid collection along this tract within the left abdominal musculature measuring 2.8 x 2.0 cm (3:67). IMPRESSION: 1. 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 An irregular collection in communication posteriorly extends into the rectouterine pouch, measuring 6.8 x 3.2 cm, previously 3.5 x 3.5 cm. 2. Subcutaneous stranding and emphysema along the left anterior lower abdominal wall, where a drainage catheter was seen previously. Interval development of a gas-containing fluid collection along this tract within the left abdominal musculature measuring 2.8 x 2.0 cm. 3. Aortic Atherosclerosis (ICD10-I70.0). Electronically  Signed   By: Agustin Cree M.D.   On: 05/10/2022 18:52        Scheduled Meds:  atorvastatin  20 mg Oral Daily   cariprazine  6 mg Oral QPM   Dextromethorphan-quiNIDine  1 capsule Oral Q12H   enoxaparin (LOVENOX) injection  50 mg Subcutaneous Q24H   [START ON 05/12/2022] furosemide  20 mg Oral BID   insulin aspart  0-15 Units Subcutaneous Q4H   [START ON 05/12/2022] losartan  25 mg Oral BID   pregabalin  300 mg Oral BID   sodium chloride flush  5 mL Intracatheter Q8H   Continuous Infusions:  lactated ringers 75 mL/hr at 05/11/22 1704   piperacillin-tazobactam (ZOSYN)  IV 3.375 g (05/11/22 1413)     LOS: 1 day    Time spent: 40 minutes    Ramiro Harvest, MD [facility]   To contact the attending provider between 7A-7P or the covering provider during after hours 7P-7A, please log into the web site [email or web address] and access using universal [place] password for that web site. If you do not have the password, please call the hospital operator.  05/11/2022, 5:16 PM

## 2022-05-12 ENCOUNTER — Inpatient Hospital Stay (HOSPITAL_COMMUNITY): Payer: Medicare PPO

## 2022-05-12 DIAGNOSIS — F319 Bipolar disorder, unspecified: Secondary | ICD-10-CM | POA: Diagnosis not present

## 2022-05-12 DIAGNOSIS — I5032 Chronic diastolic (congestive) heart failure: Secondary | ICD-10-CM

## 2022-05-12 DIAGNOSIS — I501 Left ventricular failure: Secondary | ICD-10-CM

## 2022-05-12 DIAGNOSIS — I10 Essential (primary) hypertension: Secondary | ICD-10-CM | POA: Diagnosis not present

## 2022-05-12 DIAGNOSIS — K651 Peritoneal abscess: Secondary | ICD-10-CM | POA: Diagnosis not present

## 2022-05-12 DIAGNOSIS — E1169 Type 2 diabetes mellitus with other specified complication: Secondary | ICD-10-CM | POA: Diagnosis not present

## 2022-05-12 LAB — CBC
HCT: 41.2 % (ref 36.0–46.0)
Hemoglobin: 13.2 g/dL (ref 12.0–15.0)
MCH: 28.5 pg (ref 26.0–34.0)
MCHC: 32 g/dL (ref 30.0–36.0)
MCV: 89 fL (ref 80.0–100.0)
Platelets: 371 10*3/uL (ref 150–400)
RBC: 4.63 MIL/uL (ref 3.87–5.11)
RDW: 15.3 % (ref 11.5–15.5)
WBC: 4.9 10*3/uL (ref 4.0–10.5)
nRBC: 0 % (ref 0.0–0.2)

## 2022-05-12 LAB — COMPREHENSIVE METABOLIC PANEL
ALT: 22 U/L (ref 0–44)
AST: 19 U/L (ref 15–41)
Albumin: 2.2 g/dL — ABNORMAL LOW (ref 3.5–5.0)
Alkaline Phosphatase: 74 U/L (ref 38–126)
Anion gap: 13 (ref 5–15)
BUN: 12 mg/dL (ref 6–20)
CO2: 28 mmol/L (ref 22–32)
Calcium: 9.4 mg/dL (ref 8.9–10.3)
Chloride: 98 mmol/L (ref 98–111)
Creatinine, Ser: 0.81 mg/dL (ref 0.44–1.00)
GFR, Estimated: >60 mL/min (ref >60)
Glucose, Bld: 120 mg/dL — ABNORMAL HIGH (ref 70–99)
Potassium: 3.7 mmol/L (ref 3.5–5.1)
Sodium: 139 mmol/L (ref 135–145)
Total Bilirubin: 0.9 mg/dL (ref 0.3–1.2)
Total Protein: 6 g/dL — ABNORMAL LOW (ref 6.5–8.1)

## 2022-05-12 LAB — ECHOCARDIOGRAM LIMITED
Calc EF: 40.2 %
Height: 65 in
S' Lateral: 4.1 cm
Single Plane A2C EF: 41.4 %
Single Plane A4C EF: 34.7 %
Weight: 4045.88 oz

## 2022-05-12 LAB — GLUCOSE, CAPILLARY
Glucose-Capillary: 92 mg/dL (ref 70–99)
Glucose-Capillary: 96 mg/dL (ref 70–99)
Glucose-Capillary: 122 mg/dL — ABNORMAL HIGH (ref 70–99)
Glucose-Capillary: 143 mg/dL — ABNORMAL HIGH (ref 70–99)
Glucose-Capillary: 113 mg/dL — ABNORMAL HIGH (ref 70–99)
Glucose-Capillary: 102 mg/dL — ABNORMAL HIGH (ref 70–99)

## 2022-05-12 LAB — HEMOGLOBIN A1C
Hgb A1c MFr Bld: 7.3 % — ABNORMAL HIGH (ref 4.8–5.6)
Mean Plasma Glucose: 162.81 mg/dL

## 2022-05-12 LAB — AEROBIC/ANAEROBIC CULTURE W GRAM STAIN (SURGICAL/DEEP WOUND)

## 2022-05-12 LAB — MAGNESIUM: Magnesium: 1.7 mg/dL (ref 1.7–2.4)

## 2022-05-12 MED ORDER — MAGNESIUM SULFATE 2 GM/50ML IV SOLN
2.0000 g | Freq: Once | INTRAVENOUS | Status: AC
  Administered 2022-05-12: 2 g via INTRAVENOUS
  Filled 2022-05-12: qty 50

## 2022-05-12 MED ORDER — POTASSIUM CHLORIDE CRYS ER 20 MEQ PO TBCR
20.0000 meq | EXTENDED_RELEASE_TABLET | Freq: Every day | ORAL | Status: DC
  Administered 2022-05-12 – 2022-05-15 (×3): 20 meq via ORAL
  Filled 2022-05-12 (×3): qty 1

## 2022-05-12 MED ORDER — INSULIN ASPART 100 UNIT/ML IJ SOLN
0.0000 [IU] | Freq: Three times a day (TID) | INTRAMUSCULAR | Status: DC
  Administered 2022-05-12 – 2022-05-14 (×4): 2 [IU] via SUBCUTANEOUS
  Administered 2022-05-15: 3 [IU] via SUBCUTANEOUS

## 2022-05-12 NOTE — Progress Notes (Signed)
PROGRESS NOTE    [name]  [name]:[id number] DOB: 1970/08/11 DOA: 05/10/2022 PCP: [place], Betty G, MD    Chief Complaint  Patient presents with   Abdominal Pain    Brief Narrative:  Patient is a pleasant 52 year old female history of morbid obesity, diet-controlled type 2 diabetes, hypertension, OSA on CPAP, bipolar disorder, history of perforated diverticulitis status post multiple drains November 2023 presented to the ED with increasing abdominal pain and drainage from prior drain site for the past 3 to 5 days.  Patient noted to have had last intra-abdominal drain pulled at the end of March.  Patient seen in the ED CT abdomen and pelvis done concerning for abscesses as well as fistulous connection with inflamed sigmoid colon.  Patient placed empirically on IV antibiotics.  General surgery consulted.   Assessment & Plan:   Principal Problem:   Intra-abdominal abscess Active Problems:   Morbid obesity   Bipolar disorder   Type 2 diabetes mellitus with other specified complication   OSA on CPAP   Essential hypertension, benign   Chronic combined systolic and diastolic CHF (congestive heart failure)  #1 intra-abdominal abscesses/fistulous connection with sigmoid colon -Patient with prior history of perforated diverticulitis status post multiple drain placement in November 2023, last drain removed end of March presented with a 3 to 5-day history of abdominal pain and increased drainage from prior drainage site. -CT abdomen and pelvis done concerning for abscesses and fistulous formation. -Admitting physician discussed with patient that patient may need surgery at this time for sigmoid colectomy however defer to general surgeon. -Patient seen in consultation by general surgery, who recommended IR evaluation for drain placement which was done 05/11/2022, and recommending that patient will likely eventually need colectomy. -Patient also seen in consultation by cardiology for  cardiac clearance. -Patient currently on clears.  -Continue empiric IV Zosyn. -Per general surgery.  2.  Chronic combined systolic and diastolic CHF, POA -Currently euvolemic. -Patient diuretics of Lasix was held yesterday as patient was made n.p.o. and patient placed on gentle hydration.   -Patient resumed back on Lasix 40 mg twice daily.  Patient noted to be on Lasix 80 mg in the morning and 40 mg in the evenings prior to admission.   -Cozaar resumed. -Cardiology following.  3.  Hypertension -BP was soft yesterday and patient noted to only be on clears and as such Lasix and Cozaar held.   -Lasix and Cozaar resumed today.   4.  Type 2 diabetes mellitus, diet controlled -Hemoglobin A1c noted at 5.9 (11/14/2021) -CBG 92 this morning. -Hemoglobin A1c 7.3 (05/12/2022). -SSI.  5.  OSA -CPAP nightly.   6.  Morbid obesity -Lifestyle modification. -Outpatient follow-up PCP.  7.  Bipolar disorder -Continue home regimen psych medications.  8.  Hyperlipidemia -Statin.   DVT prophylaxis: Heparin>>>> Lovenox Code Status: Full Family Communication: Updated patient.  No family at bedside. Disposition: Likely home when clinically improved, cleared by general surgery.  Status is: Inpatient Remains inpatient appropriate because: Severity of illness   Consultants:  General surgery: Dr. Dwain Sarna 05/11/2022 Interventional radiology: Dr. Lowella Dandy 05/11/2022 Cardiology: Dr. Shari Prows 05/11/2022 Wound care RN: Ria Bush, RN 05/11/2022  Procedures:  CT abdomen and pelvis 05/10/2022 CT-guided placement of lower abdominal abscess drain per IR: Dr. Lowella Dandy 05/11/2022 2D echo 05/12/2022   Antimicrobials:  IV Zosyn 05/10/2022>>>>   Subjective: Sleeping but arousable.  No chest pain. No shortness of breath.  States abdominal pain improved after drain placed yesterday.    Objective: Vitals:   05/11/22  2050 05/12/22 0513 05/12/22 0600 05/12/22 0728  BP: (!) 99/54 (!) 141/54  137/71  Pulse: (!)  59 63  60  Resp: Temp: 98 F (36.7 C) 97.8 F (36.6 C)  (!) 97.4 F (36.3 C)  TempSrc: Oral   Oral  SpO2: 99% 98%  96%  Weight:   114.7 kg   Height:        Intake/Output Summary (Last 24 hours) at 05/12/2022 1327 Last data filed at 05/12/2022 1244 Gross per 24 hour  Intake 552.91 ml  Output 2173 ml  Net -1620.09 ml    Filed Weights   05/10/22 1446 05/10/22 2120 05/12/22 0600  Weight: 122 kg 112.7 kg 114.7 kg    Examination:  General exam: NAD Respiratory system: Lungs clear to auscultation bilaterally.  No wheezes, no crackles, no rhonchi.  Fair air movement.  Speaking in full sentences.  Normal respiratory effort.   Cardiovascular system: Regular rate rhythm no murmurs rubs or gallops.  No JVD.  No lower extremity edema.  Gastrointestinal system: Abdomen is soft, nondistended, positive bowel sounds, dressing noted over left lower quadrant drain.  Left lower quadrant drain with bloody drainage noted.   Central nervous system: Alert and oriented. No focal neurological deficits. Extremities: Symmetric 5 x 5 power. Skin: No rashes, lesions or ulcers Psychiatry: Judgement and insight appear normal. Mood & affect appropriate.     Data Reviewed: I have personally reviewed following labs and imaging studies  CBC: Recent Labs  Lab 05/10/22 1448 05/11/22 0544 05/12/22 0257  WBC 9.8 6.5 4.9  NEUTROABS 7.9* 4.0  --   HGB 15.0 13.6 13.2  HCT 45.7 41.5 41.2  MCV 87.7 87.4 89.0  PLT 381 362 371     Basic Metabolic Panel: Recent Labs  Lab 05/10/22 1448 05/11/22 0544 05/12/22 0257  NA 136 139 139  K 3.6 3.6 3.7  CL 99 103 98  CO2 GLUCOSE 208* 122* 120*  BUN CREATININE 0.77 0.76 0.81  CALCIUM 9.5 9.2 9.4  MG  --  1.8 1.7     GFR: Estimated Creatinine Clearance: 102.7 mL/min (by C-G formula based on SCr of 0.81 mg/dL).  Liver Function Tests: Recent Labs  Lab 05/10/22 1448 05/11/22 0544 05/12/22 0257  AST 29 33 19  ALT ALKPHOS 95 79 74  BILITOT 0.9 0.8 0.9  PROT 6.8 6.2* 6.0*  ALBUMIN 2.7* 2.3* 2.2*     CBG: Recent Labs  Lab 05/11/22 2052 05/12/22 0107 05/12/22 0514 05/12/22 0742 05/12/22 1224  GLUCAP 95 92 96 122* 143*      Recent Results (from the past 240 hour(s))  Aerobic/Anaerobic Culture w Gram Stain (surgical/deep wound)     Status: None (Preliminary result)   Collection Time: 05/11/22  1:30 PM   Specimen: Abscess  Result Value Ref Range Status   Specimen Description ABSCESS  Final   Special Requests LLQ  Final   Gram Stain   Final    NO WBC SEEN FEW GRAM POSITIVE COCCI FEW GRAM NEGATIVE RODS Performed at [facility], [address] [place], [place] [address]    Culture PENDING  Incomplete   Report Status PENDING  Incomplete         Radiology Studies: ECHOCARDIOGRAM LIMITED  Result Date: 05/12/2022    ECHOCARDIOGRAM LIMITED REPORT   Patient Name:   AMINATA BUFFALO Date of Exam: 05/12/2022 Medical Rec #:  [id number]  Height:       65.0 in Accession #:    [id number]          Weight:       252.9 lb Date of Birth:  1970/11/28          BSA:          2.185 m Patient Age:    51 years            BP:           137/71 mmHg Patient Gender: F                   HR:           56 bpm. Exam Location:  Inpatient Procedure: Limited Echo and Color Doppler Indications:    assess LVF  History:        Patient has prior history of Echocardiogram examinations, most                 recent 03/23/2020. CHF; Risk Factors:Diabetes and Hypertension.  Sonographer:    Melissa Morford RDCS (AE, PE) Referring Phys: [id number] HEATHER E PEMBERTON  Sonographer Comments: Technically challenging study due to limited acoustic windows. Image acquisition challenging due to patient body habitus. technically limited imaging-pt supine due to left lateral drains. IMPRESSIONS  1. Left ventricular ejection fraction, by estimation, is 55 to 60%. The left ventricle has normal function. There is mild  concentric left ventricular hypertrophy.  2. Right ventricular systolic function is normal. The right ventricular size is normal. Tricuspid regurgitation signal is inadequate for assessing PA pressure.  3. The mitral valve is normal in structure. Trivial mitral valve regurgitation. Comparison(s): No significant change from prior study. FINDINGS  Left Ventricle: Left ventricular ejection fraction, by estimation, is 55 to 60%. The left ventricle has normal function. The left ventricular internal cavity size was normal in size. There is mild concentric left ventricular hypertrophy. Right Ventricle: The right ventricular size is normal. No increase in right ventricular wall thickness. Right ventricular systolic function is normal. Tricuspid regurgitation signal is inadequate for assessing PA pressure. Right Atrium: Right atrial size was normal in size. Mitral Valve: The mitral valve is normal in structure. Trivial mitral valve regurgitation. Tricuspid Valve: The tricuspid valve is normal in structure. Tricuspid valve regurgitation is trivial. LEFT VENTRICLE PLAX 2D LVIDd:         4.95 cm LVIDs:         4.10 cm LV PW:         1.20 cm LV IVS:        1.20 cm  LV Volumes (MOD) LV vol d, MOD A2C: 102.0 ml LV vol d, MOD A4C: 88.0 ml LV vol s, MOD A2C: 59.8 ml LV vol s, MOD A4C: 57.5 ml LV SV MOD A2C:     42.2 ml LV SV MOD A4C:     88.0 ml LV SV MOD BP:      40.4 ml LEFT ATRIUM         Index LA diam:    3.30 cm 1.51 cm/m [name] MD Electronically signed by [name] MD Signature Date/Time: 05/12/2022/12:40:56 PM    Final    DG CHEST PORT 1 VIEW  Result Date: 05/11/2022 CLINICAL DATA:  [id number] Wheezing [id number] EXAM: PORTABLE CHEST 1 VIEW COMPARISON:  Chest x-ray 12/28/2021 FINDINGS: The heart and mediastinal contours are within normal limits. Right costophrenic angle airspace opacity. Increased interstitial markings. No pleural effusion. No pneumothorax. No acute osseous  abnormality. IMPRESSION: 1. Right  costophrenic angle airspace opacity. Followup PA and lateral chest X-ray is recommended in 3-4 weeks following therapy to ensure resolution and exclude underlying malignancy. 2. Increased interstitial markings suggestive of bronchitic changes. Electronically Signed   By: Tish Frederickson M.D.   On: 05/11/2022 18:47   CT GUIDED PERITONEAL/RETROPERITONEAL FLUID DRAIN BY PERC CATH  Result Date: 05/11/2022 INDICATION: 52 year old with a recurrent abdominal abscess. History of abdominal abscesses and percutaneous drains. Drainage from the old percutaneous drain site. EXAM: CT-GUIDED PLACEMENT OF DRAIN INTO LOWER ABDOMINAL ABSCESS TECHNIQUE: Multidetector CT imaging of the abdomen and pelvis was performed following the standard protocol without IV contrast. RADIATION DOSE REDUCTION: This exam was performed according to the departmental dose-optimization program which includes automated exposure control, adjustment of the mA and/or kV according to patient size and/or use of iterative reconstruction technique. MEDICATIONS: The patient is currently admitted to the hospital and receiving intravenous antibiotics. The antibiotics were administered within an appropriate time frame prior to the initiation of the procedure. ANESTHESIA/SEDATION: Moderate (conscious) sedation was employed during this procedure. A total of Versed 3.5 mg and Fentanyl 200 mcg was administered intravenously by the radiology nurse. Total intra-service moderate Sedation Time: 35 minutes. The patient's level of consciousness and vital signs were monitored continuously by radiology nursing throughout the procedure under my direct supervision. COMPLICATIONS: None immediate. PROCEDURE: Informed written consent was obtained from the patient after a thorough discussion of the procedural risks, benefits and alternatives. All questions were addressed. Maximal Sterile Barrier Technique was utilized including caps, mask, sterile gowns, sterile gloves, sterile  drape, hand hygiene and skin antiseptic. A timeout was performed prior to the initiation of the procedure. Patient was placed supine on CT scanner. Images through the abdomen and pelvis were obtained. The air-fluid collection near the midline of the lower abdomen was identified and targeted for drainage. The patient also has drainage from the old drain site in the left lower abdomen. The left lower abdomen was prepped with chlorhexidine and sterile field was created. Skin was anesthetized using 1% lidocaine. Small incision was made. Using CT guidance, an 18 gauge trocar needle was directed into the gas-filled collection in the lower abdomen. Yellow purulent fluid was aspirated. Superstiff Amplatz wire was placed. Tract was dilated to accommodate a 12 [place] multipurpose drain. Total of 80 mL of yellow/green fluid was aspirated. Drain was sutured to skin and attached to a suction bulb. Dressing was placed. FINDINGS: 72 French drain was placed within the lower abdominal abscess collection. 80 mL of fluid was removed. 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 Ultrasound was used to evaluate the left lateral abdominal wall near the old drain site to look for a drainable fluid collection. There is no drainable collection in this area at this time. IMPRESSION: 1. CT-guided placement a drainage catheter within the lower abdominal abscess. Purulent fluid was removed. 2. 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 3. Bloody drainage from the old drain site in the left lower abdomen. The subcutaneous and abdominal wall area was evaluated with ultrasound. There is not a discrete fluid collection in this area that is drainable. Electronically Signed   By: Richarda Overlie M.D.   On: 05/11/2022 17:39   CT ABDOMEN PELVIS W CONTRAST  Result Date: 05/10/2022 CLINICAL DATA:  History of diverticulitis complicated by  postoperative fluid, status post drainage placement. Now presenting with inadvertent drain removal. EXAM: CT  ABDOMEN AND PELVIS WITH CONTRAST TECHNIQUE: Multidetector CT imaging of the abdomen and pelvis was performed using the standard protocol following bolus administration of intravenous contrast. RADIATION DOSE REDUCTION: This exam was performed according to the departmental dose-optimization program which includes automated exposure control, adjustment of the mA and/or kV according to patient size and/or use of iterative reconstruction technique. CONTRAST:  17mL OMNIPAQUE IOHEXOL 350 MG/ML SOLN COMPARISON:  CT abdomen and pelvis dated 01/01/2022 FINDINGS: Lower chest: Scattered left lower lobe cysts, unchanged. No pleural effusion or pneumothorax demonstrated. Partially imaged heart size is normal. Coronary artery calcifications. Hepatobiliary: No focal hepatic lesions. No intra or extrahepatic biliary ductal dilation. Cholelithiasis. Pancreas: No focal lesions or main ductal dilation. Spleen: Normal in size without focal abnormality. Adrenals/Urinary Tract: No adrenal nodules. No suspicious renal mass, calculi or hydronephrosis. No focal bladder wall thickening. Stomach/Bowel: Normal appearance of the stomach. Mural thickening of the sigmoid colon with fistulization to midline abdominal collections as below. Normal appendix. Vascular/Lymphatic: Aortic atherosclerosis. No enlarged abdominal or pelvic lymph nodes. Reproductive: No adnexal masses. Other: Previously noted drainage catheters have been removed, including left lower quadrant approach drain with increased size of gas-containing midline lower abdomen irregular collection measuring 7.5 x 4.3 cm (3:73), previously 2.2 x 2.2 cm, which demonstrates fistulous connection with the sigmoid colon. An irregular collection in communication with this collection posteriorly extends into the rectouterine pouch, measuring 6.8 x 3.2 cm (3:72), previously 3.5 x 3.5 cm.  Musculoskeletal: No acute or abnormal lytic or blastic osseous lesions. Multilevel degenerative changes of the partially imaged thoracic and lumbar spine. Subcutaneous stranding and emphysema along the left anterior lower abdominal wall, where a drainage catheter was seen previously. Interval development of a gas-containing fluid collection along this tract within the left abdominal musculature measuring 2.8 x 2.0 cm (3:67). IMPRESSION: 1. 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 An irregular collection in communication posteriorly extends into the rectouterine pouch, measuring 6.8 x 3.2 cm, previously 3.5 x 3.5 cm. 2. Subcutaneous stranding and emphysema along the left anterior lower abdominal wall, where a drainage catheter was seen previously. Interval development of a gas-containing fluid collection along this tract within the left abdominal musculature measuring 2.8 x 2.0 cm. 3. Aortic Atherosclerosis (ICD10-I70.0). Electronically Signed   By: Agustin Cree M.D.   On: 05/10/2022 18:52        Scheduled Meds:  ALPRAZolam  1 mg Oral TID   amphetamine-dextroamphetamine  20 mg Oral QID   aspirin EC  81 mg Oral Daily   atorvastatin  20 mg Oral Daily   cariprazine  6 mg Oral QPM   Dextromethorphan-quiNIDine  1 capsule Oral Q12H   enoxaparin (LOVENOX) injection  50 mg Subcutaneous Q24H   furosemide  40 mg Oral BID   insulin aspart  0-15 Units Subcutaneous TID WC   losartan  25 mg Oral BID   potassium chloride SA  20 mEq Oral Daily   pregabalin  300 mg Oral BID   sodium chloride flush  5 mL Intracatheter Q8H   Continuous Infusions:  piperacillin-tazobactam (ZOSYN)  IV 3.375 g (05/12/22 1227)     LOS: 2 days    Time spent: 35 minutes    Ramiro Harvest, MD [facility]   To contact the attending provider between 7A-7P or the covering provider during after hours  7P-7A, please log into the web site [email or web address] and access using universal [place] password for that web site. If you do not have the password, please call the hospital operator.  05/12/2022, 1:27 PM

## 2022-05-12 NOTE — Progress Notes (Signed)
  Echocardiogram 2D Echocardiogram has been performed.  [name] 05/12/2022, 11:07 AM

## 2022-05-12 NOTE — Progress Notes (Addendum)
Subjective No acute events. IR drain placed yesterday. Feeling well otherwise.   Objective: Vital signs in last 24 hours: Temp:  [97.2 F (36.2 C)-98 F (36.7 C)] 97.4 F (36.3 C) (04/13 0728) Pulse Rate:  [59-66] 60 (04/13 0728) Resp:  [16-20] 18 (04/13 0728) BP: (99-145)/(54-91) 137/71 (04/13 0728) SpO2:  [94 %-100 %] 96 % (04/13 0728) Weight:  [114.7 kg] 114.7 kg (04/13 0600) Last BM Date : 05/10/22  Intake/Output from previous day: 04/12 0701 - 04/13 0700 In: 1031.3 [P.O.:150; I.V.:761.5; IV Piggyback:119.8] Out: 428 [Urine:300; Drains:128] Intake/Output this shift: Total I/O In: -  Out: 345 [Urine:300; Drains:45]  Gen: NAD, comfortable CV: RRR Pulm: Normal work of breathing Abd: Soft, obese, soft, drain in place Ext: SCDs in place  Lab Results: CBC  Recent Labs    05/11/22 0544 05/12/22 0257  WBC 6.5 4.9  HGB 13.6 13.2  HCT 41.5 41.2  PLT 362 371   BMET Recent Labs    05/11/22 0544 05/12/22 0257  NA 139 139  K 3.6 3.7  CL 103 98  CO2 26 28  GLUCOSE 122* 120*  BUN 15 12  CREATININE 0.76 0.81  CALCIUM 9.2 9.4   PT/INR Recent Labs    05/11/22 1118  LABPROT 13.9  INR 1.1   ABG No results for input(s): "PHART", "HCO3" in the last 72 hours.  Invalid input(s): "PCO2", "PO2"  Studies/Results:  Anti-infectives: Anti-infectives (From admission, onward)    Start     Dose/Rate Route Frequency Ordered Stop   05/10/22 2100  piperacillin-tazobactam (ZOSYN) IVPB 3.375 g        3.375 g 12.5 mL/hr over 240 Minutes Intravenous Every 8 hours 05/10/22 2013          Assessment/Plan: Patient Active Problem List   Diagnosis Date Noted   Intra-abdominal abscess 05/10/2022   Cervical myelopathy 05/14/2019   Left buttock abscess 08/27/2017   OSA on CPAP    Essential hypertension, benign    Polycythemia vera    Bipolar I disorder    Depression    Anxiety state    Chronic combined systolic and diastolic CHF (congestive heart failure)     Pulmonary hypertension    Type 2 diabetes mellitus with other specified complication 05/04/2015   Bipolar disorder 11/14/2012   Hyperlipidemia    Anxiety    Asthma 09/05/2011   Allergic rhinitis 05/16/2011   Cardiomyopathy 07/06/2010   Morbid obesity 07/06/2010   OSA (obstructive sleep apnea) 06/05/2010   Obesity hypoventilation syndrome 06/05/2010    H/o laparoscopic peritoneal lavage with drain placement on 12/11/2021 Dr. Dossie Der for diverticulitis  H/o multiple prior IR drain placements for IAA. Last one removed 3/20 Recurrent IAA Lower abdominal midline: 7.5 x 4.3 cm, previously 2.2 x 2.2 cm with  fistulous connection with the inflamed sigmoid colon Communicating collection extending into rectouterine pouch 6.8 x 3.2 cm   - last drain which had prev been in communication with the colon was removed 04/18/2022 after CT drain study showing no residual abscess or fistula. 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 Recommend IR drainage for now and at this point will likely require surgical resection this admission which would entail colostomy creation. She has history of CHF and last echo 2022. She says she is due for cardiology follow up. She may need preoperative cardiology clearance. We will follow   FEN: Can have CLD, ensure ID: [email or web address] VTE: heparin subq   Chronic combined systolic and diastolic CHF (congestive heart failure) -  last echo 2022 with LVEF 60-65% Essential hypertension, benign OSA on CPAP Type 2 diabetes mellitus with other specified complication Bipolar disorder Morbid obesity   LOS: 2 days   I spent a total of 35 minutes in both face-to-face and non-face-to-face activities, excluding procedures performed, for this visit on the date of this encounter.  Marin Olp, MD [facility], [facility]

## 2022-05-12 NOTE — Progress Notes (Signed)
Progress Note  Patient Name: [name] Date of Encounter: 05/12/2022  Primary Cardiologist: [name], MD  Subjective   No acute events overnight, received IR drain yesterday.  Received IV Lasix.  No symptoms of SOB and no leg swelling.  Inpatient Medications    Scheduled Meds:  ALPRAZolam  1 mg Oral TID   amphetamine-dextroamphetamine  20 mg Oral QID   aspirin EC  81 mg Oral Daily   atorvastatin  20 mg Oral Daily   cariprazine  6 mg Oral QPM   Dextromethorphan-quiNIDine  1 capsule Oral Q12H   enoxaparin (LOVENOX) injection  50 mg Subcutaneous Q24H   furosemide  40 mg Oral BID   insulin aspart  0-15 Units Subcutaneous TID WC   losartan  25 mg Oral BID   potassium chloride SA  20 mEq Oral Daily   pregabalin  300 mg Oral BID   sodium chloride flush  5 mL Intracatheter Q8H   Continuous Infusions:  piperacillin-tazobactam (ZOSYN)  IV 3.375 g (05/12/22 1227)   PRN Meds: acetaminophen **OR** acetaminophen, albuterol, HYDROmorphone (DILAUDID) injection, levalbuterol, melatonin, ondansetron **OR** ondansetron (ZOFRAN) IV, oxyCODONE   Vital Signs    Vitals:   05/11/22 2050 05/12/22 0513 05/12/22 0600 05/12/22 0728  BP: (!) 99/54 (!) 141/54  137/71  Pulse: (!) 59 63  60  Resp: Temp: 98 F (36.7 C) 97.8 F (36.6 C)  (!) 97.4 F (36.3 C)  TempSrc: Oral   Oral  SpO2: 99% 98%  96%  Weight:   114.7 kg   Height:        Intake/Output Summary (Last 24 hours) at 05/12/2022 1259 Last data filed at 05/12/2022 1244 Gross per 24 hour  Intake 552.91 ml  Output 2173 ml  Net -1620.09 ml   Filed Weights   05/10/22 1446 05/10/22 2120 05/12/22 0600  Weight: 122 kg 112.7 kg 114.7 kg    Telemetry     Personally reviewed, NSR  ECG   Not performed today  Physical Exam   GEN: No acute distress.   Neck: JVD elevated till halfway of the neck, 10 mm Hg Cardiac: RRR, no murmur, rub, or gallop.  Respiratory: Nonlabored. Clear to auscultation  bilaterally. GI: Soft, nontender, bowel sounds present. MS: No edema; No deformity. Neuro:  Nonfocal. Psych: Alert and oriented x 3. Normal affect.  Labs    Chemistry Recent Labs  Lab 05/10/22 1448 05/11/22 0544 05/12/22 0257  NA 136 139 139  K 3.6 3.6 3.7  CL 99 103 98  CO2 GLUCOSE 208* 122* 120*  BUN CREATININE 0.77 0.76 0.81  CALCIUM 9.5 9.2 9.4  PROT 6.8 6.2* 6.0*  ALBUMIN 2.7* 2.3* 2.2*  AST 29 33 19  ALT ALKPHOS 95 79 74  BILITOT 0.9 0.8 0.9  GFRNONAA >60 >60 >60  ANIONGAP Hematology Recent Labs  Lab 05/10/22 1448 05/11/22 0544 05/12/22 0257  WBC 9.8 6.5 4.9  RBC 5.21* 4.75 4.63  HGB 15.0 13.6 13.2  HCT 45.7 41.5 41.2  MCV 87.7 87.4 89.0  MCH 28.8 28.6 28.5  MCHC 32.8 32.8 32.0  RDW 15.1 15.1 15.3  PLT 381 362 371    Cardiac EnzymesNo results for input(s): "TROPONINIHS" in the last 720 hours.  BNPNo results for input(s): "BNP", "PROBNP" in the last 168 hours.   DDimerNo results for input(s): "DDIMER" in the last 168 hours.  Radiology    ECHOCARDIOGRAM LIMITED  Result Date: 05/12/2022    ECHOCARDIOGRAM LIMITED REPORT   Patient Name:   [name] Date of Exam: 05/12/2022 Medical Rec #:  [id number]           Height:       65.0 in Accession #:    [id number]          Weight:       252.9 lb Date of Birth:  1970-09-13          BSA:          2.185 m Patient Age:    51 years            BP:           137/71 mmHg Patient Gender: F                   HR:           56 bpm. Exam Location:  Inpatient Procedure: Limited Echo and Color Doppler Indications:    assess LVF  History:        Patient has prior history of Echocardiogram examinations, most                 recent 03/23/2020. CHF; Risk Factors:Diabetes and Hypertension.  Sonographer:    Melissa Morford RDCS (AE, PE) Referring Phys: [id number] HEATHER E [place]  Sonographer Comments: Technically challenging study due to limited acoustic windows. Image acquisition  challenging due to patient body habitus. technically limited imaging-pt supine due to left lateral drains. IMPRESSIONS  1. Left ventricular ejection fraction, by estimation, is 55 to 60%. The left ventricle has normal function. There is mild concentric left ventricular hypertrophy.  2. Right ventricular systolic function is normal. The right ventricular size is normal. Tricuspid regurgitation signal is inadequate for assessing PA pressure.  3. The mitral valve is normal in structure. Trivial mitral valve regurgitation. Comparison(s): No significant change from prior study. FINDINGS  Left Ventricle: Left ventricular ejection fraction, by estimation, is 55 to 60%. The left ventricle has normal function. The left ventricular internal cavity size was normal in size. There is mild concentric left ventricular hypertrophy. Right Ventricle: The right ventricular size is normal. No increase in right ventricular wall thickness. Right ventricular systolic function is normal. Tricuspid regurgitation signal is inadequate for assessing PA pressure. Right Atrium: Right atrial size was normal in size. Mitral Valve: The mitral valve is normal in structure. Trivial mitral valve regurgitation. Tricuspid Valve: The tricuspid valve is normal in structure. Tricuspid valve regurgitation is trivial. LEFT VENTRICLE PLAX 2D LVIDd:         4.95 cm LVIDs:         4.10 cm LV PW:         1.20 cm LV IVS:        1.20 cm  LV Volumes (MOD) LV vol d, MOD A2C: 102.0 ml LV vol d, MOD A4C: 88.0 ml LV vol s, MOD A2C: 59.8 ml LV vol s, MOD A4C: 57.5 ml LV SV MOD A2C:     42.2 ml LV SV MOD A4C:     88.0 ml LV SV MOD BP:      40.4 ml LEFT ATRIUM         Index LA diam:    3.30 cm 1.51 cm/m [name] MD Electronically signed by [name] MD Signature Date/Time: 05/12/2022/12:40:56 PM    Final    DG CHEST PORT 1 VIEW  Result  Date: 05/11/2022 CLINICAL DATA:  10028 Wheezing 16109 EXAM: PORTABLE CHEST 1 VIEW COMPARISON:  Chest x-ray 12/28/2021  FINDINGS: The heart and mediastinal contours are within normal limits. Right costophrenic angle airspace opacity. Increased interstitial markings. No pleural effusion. No pneumothorax. No acute osseous abnormality. IMPRESSION: 1. Right costophrenic angle airspace opacity. Followup PA and lateral chest X-ray is recommended in 3-4 weeks following therapy to ensure resolution and exclude underlying malignancy. 2. Increased interstitial markings suggestive of bronchitic changes. Electronically Signed   By: Tish Frederickson M.D.   On: 05/11/2022 18:47   CT GUIDED PERITONEAL/RETROPERITONEAL FLUID DRAIN BY PERC CATH  Result Date: 05/11/2022 INDICATION: 52 year old with a recurrent abdominal abscess. History of abdominal abscesses and percutaneous drains. Drainage from the old percutaneous drain site. EXAM: CT-GUIDED PLACEMENT OF DRAIN INTO LOWER ABDOMINAL ABSCESS TECHNIQUE: Multidetector CT imaging of the abdomen and pelvis was performed following the standard protocol without IV contrast. RADIATION DOSE REDUCTION: This exam was performed according to the departmental dose-optimization program which includes automated exposure control, adjustment of the mA and/or kV according to patient size and/or use of iterative reconstruction technique. MEDICATIONS: The patient is currently admitted to the hospital and receiving intravenous antibiotics. The antibiotics were administered within an appropriate time frame prior to the initiation of the procedure. ANESTHESIA/SEDATION: Moderate (conscious) sedation was employed during this procedure. A total of Versed 3.5 mg and Fentanyl 200 mcg was administered intravenously by the radiology nurse. Total intra-service moderate Sedation Time: 35 minutes. The patient's level of consciousness and vital signs were monitored continuously by radiology nursing throughout the procedure under my direct supervision. COMPLICATIONS: None immediate. PROCEDURE: Informed written consent was obtained  from the patient after a thorough discussion of the procedural risks, benefits and alternatives. All questions were addressed. Maximal Sterile Barrier Technique was utilized including caps, mask, sterile gowns, sterile gloves, sterile drape, hand hygiene and skin antiseptic. A timeout was performed prior to the initiation of the procedure. Patient was placed supine on CT scanner. Images through the abdomen and pelvis were obtained. The air-fluid collection near the midline of the lower abdomen was identified and targeted for drainage. The patient also has drainage from the old drain site in the left lower abdomen. The left lower abdomen was prepped with chlorhexidine and sterile field was created. Skin was anesthetized using 1% lidocaine. Small incision was made. Using CT guidance, an 18 gauge trocar needle was directed into the gas-filled collection in the lower abdomen. Yellow purulent fluid was aspirated. Superstiff Amplatz wire was placed. Tract was dilated to accommodate a 12 [place] multipurpose drain. Total of 80 mL of yellow/green fluid was aspirated. Drain was sutured to skin and attached to a suction bulb. Dressing was placed. FINDINGS: 39 French drain was placed within the lower abdominal abscess collection. 80 mL of fluid was removed. 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 Ultrasound was used to evaluate the left lateral abdominal wall near the old drain site to look for a drainable fluid collection. There is no drainable collection in this area at this time. IMPRESSION: 1. CT-guided placement a drainage catheter within the lower abdominal abscess. Purulent fluid was removed. 2. 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 3. Bloody drainage from the old drain site in the left lower abdomen. The subcutaneous and abdominal wall area was evaluated with ultrasound. There is not a discrete  fluid collection in this area that is drainable. Electronically Signed  By: Richarda Overlie M.D.   On: 05/11/2022 17:39   CT ABDOMEN PELVIS W CONTRAST  Result Date: 05/10/2022 CLINICAL DATA:  History of diverticulitis complicated by postoperative fluid, status post drainage placement. Now presenting with inadvertent drain removal. EXAM: CT ABDOMEN AND PELVIS WITH CONTRAST TECHNIQUE: Multidetector CT imaging of the abdomen and pelvis was performed using the standard protocol following bolus administration of intravenous contrast. RADIATION DOSE REDUCTION: This exam was performed according to the departmental dose-optimization program which includes automated exposure control, adjustment of the mA and/or kV according to patient size and/or use of iterative reconstruction technique. CONTRAST:  75mL OMNIPAQUE IOHEXOL 350 MG/ML SOLN COMPARISON:  CT abdomen and pelvis dated 01/01/2022 FINDINGS: Lower chest: Scattered left lower lobe cysts, unchanged. No pleural effusion or pneumothorax demonstrated. Partially imaged heart size is normal. Coronary artery calcifications. Hepatobiliary: No focal hepatic lesions. No intra or extrahepatic biliary ductal dilation. Cholelithiasis. Pancreas: No focal lesions or main ductal dilation. Spleen: Normal in size without focal abnormality. Adrenals/Urinary Tract: No adrenal nodules. No suspicious renal mass, calculi or hydronephrosis. No focal bladder wall thickening. Stomach/Bowel: Normal appearance of the stomach. Mural thickening of the sigmoid colon with fistulization to midline abdominal collections as below. Normal appendix. Vascular/Lymphatic: Aortic atherosclerosis. No enlarged abdominal or pelvic lymph nodes. Reproductive: No adnexal masses. Other: Previously noted drainage catheters have been removed, including left lower quadrant approach drain with increased size of gas-containing midline lower abdomen irregular collection measuring 7.5 x 4.3 cm (3:73), previously 2.2 x 2.2  cm, which demonstrates fistulous connection with the sigmoid colon. An irregular collection in communication with this collection posteriorly extends into the rectouterine pouch, measuring 6.8 x 3.2 cm (3:72), previously 3.5 x 3.5 cm. Musculoskeletal: No acute or abnormal lytic or blastic osseous lesions. Multilevel degenerative changes of the partially imaged thoracic and lumbar spine. Subcutaneous stranding and emphysema along the left anterior lower abdominal wall, where a drainage catheter was seen previously. Interval development of a gas-containing fluid collection along this tract within the left abdominal musculature measuring 2.8 x 2.0 cm (3:67). IMPRESSION: 1. 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 An irregular collection in communication posteriorly extends into the rectouterine pouch, measuring 6.8 x 3.2 cm, previously 3.5 x 3.5 cm. 2. Subcutaneous stranding and emphysema along the left anterior lower abdominal wall, where a drainage catheter was seen previously. Interval development of a gas-containing fluid collection along this tract within the left abdominal musculature measuring 2.8 x 2.0 cm. 3. Aortic Atherosclerosis (ICD10-I70.0). Electronically Signed   By: Agustin Cree M.D.   On: 05/10/2022 18:52    Cardiac Studies     Assessment & Plan   Patient is a 52 year old F known to have chronic diastolic heart failure, pulmonary HTN, OSA, HLD, DM2, diverticulitis is currently admitted to hospitalist team for the management of intra-abdominal abscess, recurrent status post IR drain.  Cardiologist consulted for preop cardiac risk stratification.  # Preop cardiac risk stratification for surgical colectomy and colostomy creation -Patient does not have DOE or angina. EKG showed  NSR with no ST changes. Echocardiogram from today showed normal LVEF, normal RV systolic function and inadequate TR jet to calculate PASP. I reviewed all the prior echoes the lowest LVEF was 50%. JVD is mildly elevated, continue p.o. Lasix 40 mg twice daily. Patient is at a moderate risk of  perioperative cardiac complications and no further cardiac testing is indicated prior to proceeding with the planned procedure.  Per cardiology consult note, patient already tolerated sedation/intubation in 11/2021 procedure without any complications.  # Chronic diastolic heart failure: Patient was on p.o. Lasix 80 mg in the a.m. and 40 mg in the p.m. Okay to continue p.o. Lasix 40 mg twice daily during the hospitalization. Cautious IV fluids throughout the perioperative period.  I have spent a total of 30 minutes with patient reviewing chart , telemetry, EKGs, labs and examining patient as well as establishing an assessment and plan that was discussed with the patient.  > 50% of time was spent in direct patient care.      Signed, Marjo Bicker, MD  05/12/2022, 12:59 PM

## 2022-05-13 DIAGNOSIS — K651 Peritoneal abscess: Secondary | ICD-10-CM | POA: Diagnosis not present

## 2022-05-13 DIAGNOSIS — E1169 Type 2 diabetes mellitus with other specified complication: Secondary | ICD-10-CM | POA: Diagnosis not present

## 2022-05-13 DIAGNOSIS — I10 Essential (primary) hypertension: Secondary | ICD-10-CM | POA: Diagnosis not present

## 2022-05-13 DIAGNOSIS — F319 Bipolar disorder, unspecified: Secondary | ICD-10-CM | POA: Diagnosis not present

## 2022-05-13 LAB — BASIC METABOLIC PANEL
Anion gap: 7 (ref 5–15)
BUN: 6 mg/dL (ref 6–20)
CO2: 31 mmol/L (ref 22–32)
Calcium: 8.8 mg/dL — ABNORMAL LOW (ref 8.9–10.3)
Chloride: 98 mmol/L (ref 98–111)
Creatinine, Ser: 0.95 mg/dL (ref 0.44–1.00)
GFR, Estimated: >60 mL/min (ref >60)
Glucose, Bld: 133 mg/dL — ABNORMAL HIGH (ref 70–99)
Potassium: 3.2 mmol/L — ABNORMAL LOW (ref 3.5–5.1)
Sodium: 136 mmol/L (ref 135–145)

## 2022-05-13 LAB — GLUCOSE, CAPILLARY
Glucose-Capillary: 108 mg/dL — ABNORMAL HIGH (ref 70–99)
Glucose-Capillary: 109 mg/dL — ABNORMAL HIGH (ref 70–99)
Glucose-Capillary: 150 mg/dL — ABNORMAL HIGH (ref 70–99)
Glucose-Capillary: 116 mg/dL — ABNORMAL HIGH (ref 70–99)

## 2022-05-13 LAB — CBC WITH DIFFERENTIAL/PLATELET
Abs Immature Granulocytes: 0 10*3/uL (ref 0.00–0.07)
Basophils Absolute: 0 10*3/uL (ref 0.0–0.1)
Basophils Relative: 1 %
Eosinophils Absolute: 0.2 10*3/uL (ref 0.0–0.5)
Eosinophils Relative: 5 %
HCT: 40.4 % (ref 36.0–46.0)
Hemoglobin: 12.7 g/dL (ref 12.0–15.0)
Lymphocytes Relative: 30 %
Lymphs Abs: 1.4 10*3/uL (ref 0.7–4.0)
MCH: 28 pg (ref 26.0–34.0)
MCHC: 31.4 g/dL (ref 30.0–36.0)
MCV: 89 fL (ref 80.0–100.0)
Monocytes Absolute: 0.3 10*3/uL (ref 0.1–1.0)
Monocytes Relative: 7 %
Neutro Abs: 2.7 10*3/uL (ref 1.7–7.7)
Neutrophils Relative %: 57 %
Platelets: 400 10*3/uL (ref 150–400)
RBC: 4.54 MIL/uL (ref 3.87–5.11)
RDW: 15.2 % (ref 11.5–15.5)
WBC: 4.8 10*3/uL (ref 4.0–10.5)
nRBC: 0 % (ref 0.0–0.2)
nRBC: 1 /100 WBC — ABNORMAL HIGH

## 2022-05-13 LAB — AEROBIC/ANAEROBIC CULTURE W GRAM STAIN (SURGICAL/DEEP WOUND)

## 2022-05-13 LAB — MAGNESIUM: Magnesium: 1.8 mg/dL (ref 1.7–2.4)

## 2022-05-13 MED ORDER — POTASSIUM CHLORIDE CRYS ER 10 MEQ PO TBCR
40.0000 meq | EXTENDED_RELEASE_TABLET | ORAL | Status: AC
  Administered 2022-05-13 (×2): 40 meq via ORAL
  Filled 2022-05-13 (×2): qty 4

## 2022-05-13 MED ORDER — MAGNESIUM SULFATE 4 GM/100ML IV SOLN
4.0000 g | Freq: Once | INTRAVENOUS | Status: AC
  Administered 2022-05-13: 4 g via INTRAVENOUS
  Filled 2022-05-13: qty 100

## 2022-05-13 NOTE — Progress Notes (Addendum)
PROGRESS NOTE    [name]  ZOX:[id number] DOB: May 16, 1970 DOA: 05/10/2022 PCP: [place], Betty G, MD    Chief Complaint  Patient presents with   Abdominal Pain    Brief Narrative:  Patient is a pleasant 52 year old female history of morbid obesity, diet-controlled type 2 diabetes, hypertension, OSA on CPAP, bipolar disorder, history of perforated diverticulitis status post multiple drains November 2023 presented to the ED with increasing abdominal pain and drainage from prior drain site for the past 3 to 5 days.  Patient noted to have had last intra-abdominal drain pulled at the end of March.  Patient seen in the ED CT abdomen and pelvis done concerning for abscesses as well as fistulous connection with inflamed sigmoid colon.  Patient placed empirically on IV antibiotics.  General surgery consulted.   Assessment & Plan:   Principal Problem:   Intra-abdominal abscess Active Problems:   Morbid obesity   Bipolar disorder   Type 2 diabetes mellitus with other specified complication   OSA on CPAP   Essential hypertension, benign   Chronic combined systolic and diastolic CHF (congestive heart failure)  #1 intra-abdominal abscesses/fistulous connection with sigmoid colon -Patient with prior history of perforated diverticulitis status post multiple drain placement in November 2023, last drain removed end of March presented with a 3 to 5-day history of abdominal pain and increased drainage from prior drainage site. -CT abdomen and pelvis done concerning for abscesses and fistulous formation. -Admitting physician discussed with patient that patient may need surgery at this time for sigmoid colectomy however defer to general surgeon. -Patient seen in consultation by general surgery, who recommended IR evaluation for drain placement which was done 05/11/2022, and recommending that patient will likely eventually need colectomy.  Urine output of 65 cc over the past 24 hours. -Patient  also seen in consultation by cardiology for cardiac clearance. -Patient currently on clears.  -Continue empiric IV Zosyn. -Per general surgery.  2.  Chronic combined systolic and diastolic CHF, POA -Currently euvolemic. -Patient diuretics of Lasix was held initially on presentation, as patient was made n.p.o. and patient placed on gentle hydration.   -Patient resumed back on Lasix 40 mg twice daily.  Patient noted to be on Lasix 80 mg in the morning and 40 mg in the evenings prior to admission.   -Cozaar resumed. -Cardiology following.  3.  Hypertension -BP noted to be resolved and Lasix and Cozaar initially held.   -Currently on clears.   -Lasix and Cozaar resumed.   4.  Type 2 diabetes mellitus, diet controlled -Hemoglobin A1c noted at 5.9 (11/14/2021) -CBG 108 this morning. -Hemoglobin A1c 7.3 (05/12/2022). -SSI.  5.  OSA -CPAP nightly.    6.  Morbid obesity -Lifestyle modification. -Outpatient follow-up PCP.  7.  Bipolar disorder -Continue home regimen psych medications.  8.  Hyperlipidemia -Statin.  9.  Hypokalemia -Secondary to diuresis.   -Replete. -Place on oral daily potassium supplementation of 40 mEq daily.   DVT prophylaxis: Heparin>>>> Lovenox Code Status: Full Family Communication: Updated patient.  No family at bedside. Disposition: Likely home when clinically improved, cleared by general surgery.  Status is: Inpatient Remains inpatient appropriate because: Severity of illness   Consultants:  General surgery: Dr. Dwain Sarna 05/11/2022 Interventional radiology: Dr. Lowella Dandy 05/11/2022 Cardiology: Dr. Shari Prows 05/11/2022 Wound care RN: Ria Bush, RN 05/11/2022  Procedures:  CT abdomen and pelvis 05/10/2022 CT-guided placement of lower abdominal abscess drain per IR: Dr. Lowella Dandy 05/11/2022 2D echo 05/12/2022   Antimicrobials:  IV Zosyn 05/10/2022>>>>  Subjective: Laying in bed sleeping but arousable.  No chest pain.  No shortness of breath.   Abdominal pain improved after drain placement.  Tolerating clear liquids.    Objective: Vitals:   05/12/22 2103 05/13/22 0627 05/13/22 0815 05/13/22 0928  BP: (!) 109/57 101/69 117/79   Pulse: 69 (!) 57 (!) 55   Resp: 16 16 18    Temp: 97.6 F (36.4 C) 98.4 F (36.9 C) (!) 97.4 F (36.3 C)   TempSrc: Oral  Oral   SpO2: 98% (!) 89% 99%   Weight: 111.6 kg   111.8 kg  Height:        Intake/Output Summary (Last 24 hours) at 05/13/2022 1159 Last data filed at 05/13/2022 0928 Gross per 24 hour  Intake 240 ml  Output 2620 ml  Net -2380 ml    Filed Weights   05/12/22 0600 05/12/22 2103 05/13/22 0928  Weight: 114.7 kg 111.6 kg 111.8 kg    Examination:  General exam: NAD. Respiratory system: CTAB.  No wheezes, no crackles, no rhonchi.  Fair air movement.  Speaking in full sentences.  Cardiovascular system: RRR no murmurs rubs or gallops.  No JVD.  No lower extremity edema.  Gastrointestinal system: Abdomen is soft, nontender, nondistended, positive bowel sounds.  Dressing noted in left lower quadrant.  Left lower quadrant drain with bloody drainage.   Central nervous system: Alert and oriented. No focal neurological deficits. Extremities: Symmetric 5 x 5 power. Skin: No rashes, lesions or ulcers Psychiatry: Judgement and insight appear normal. Mood & affect appropriate.     Data Reviewed: I have personally reviewed following labs and imaging studies  CBC: Recent Labs  Lab 05/10/22 1448 05/11/22 0544 05/12/22 0257 05/13/22 0502  WBC 9.8 6.5 4.9 4.8  NEUTROABS 7.9* 4.0  --  2.7  HGB 15.0 13.6 13.2 12.7  HCT 45.7 41.5 41.2 40.4  MCV 87.7 87.4 89.0 89.0  PLT 381 362 371 400     Basic Metabolic Panel: Recent Labs  Lab 05/10/22 1448 05/11/22 0544 05/12/22 0257 05/13/22 0502  NA 136 139 139 136  K 3.6 3.6 3.7 3.2*  CL 99 103 98 98  CO2 23 26 28 31   GLUCOSE 208* 122* 120* 133*  BUN 15 15 12 6   CREATININE 0.77 0.76 0.81 0.95  CALCIUM 9.5 9.2 9.4 8.8*  MG  --   1.8 1.7 1.8     GFR: Estimated Creatinine Clearance: 86.3 mL/min (by C-G formula based on SCr of 0.95 mg/dL).  Liver Function Tests: Recent Labs  Lab 05/10/22 1448 05/11/22 0544 05/12/22 0257  AST 29 33 19  ALT 27 29 22   ALKPHOS 95 79 74  BILITOT 0.9 0.8 0.9  PROT 6.8 6.2* 6.0*  ALBUMIN 2.7* 2.3* 2.2*     CBG: Recent Labs  Lab 05/12/22 0742 05/12/22 1224 05/12/22 1707 05/12/22 2156 05/13/22 0756  GLUCAP 122* 143* 113* 102* 108*      Recent Results (from the past 240 hour(s))  Aerobic/Anaerobic Culture w Gram Stain (surgical/deep wound)     Status: None (Preliminary result)   Collection Time: 05/11/22  1:30 PM   Specimen: Abscess  Result Value Ref Range Status   Specimen Description ABSCESS  Final   Special Requests LLQ  Final   Gram Stain   Final    NO WBC SEEN FEW GRAM POSITIVE COCCI FEW GRAM NEGATIVE RODS    Culture   Final    MODERATE ESCHERICHIA COLI CULTURE REINCUBATED FOR BETTER GROWTH Performed at [facility], [address] [place], [place] [address]    Report Status PENDING  Incomplete   Organism ID, Bacteria ESCHERICHIA COLI  Final      Susceptibility   Escherichia coli - MIC*    AMPICILLIN >=32 RESISTANT Resistant     CEFEPIME <=0.12 SENSITIVE Sensitive     CEFTAZIDIME <=1 SENSITIVE Sensitive     CEFTRIAXONE <=0.25 SENSITIVE Sensitive     CIPROFLOXACIN <=0.25 SENSITIVE Sensitive     GENTAMICIN <=1 SENSITIVE Sensitive     IMIPENEM <=0.25 SENSITIVE Sensitive     TRIMETH/SULFA <=20 SENSITIVE Sensitive     AMPICILLIN/SULBACTAM 16 INTERMEDIATE Intermediate     PIP/TAZO <=4 SENSITIVE Sensitive     * MODERATE ESCHERICHIA COLI         Radiology Studies: ECHOCARDIOGRAM LIMITED  Result Date: 05/12/2022    ECHOCARDIOGRAM LIMITED REPORT   Patient Name:   TERRYANN VERBEEK Date of Exam: 05/12/2022 Medical Rec #:  [id number]           Height:       65.0 in Accession #:    [id number]          Weight:       252.9 lb Date of Birth:   1970-04-27          BSA:          2.185 m Patient Age:    51 years            BP:           137/71 mmHg Patient Gender: F                   HR:           56 bpm. Exam Location:  Inpatient Procedure: Limited Echo and Color Doppler Indications:    assess LVF  History:        Patient has prior history of Echocardiogram examinations, most                 recent 03/23/2020. CHF; Risk Factors:Diabetes and Hypertension.  Sonographer:    Melissa Morford RDCS (AE, PE) Referring Phys: [id number] HEATHER E P[place]  Sonographer Comments: Technically challenging study due to limited acoustic windows. Image acquisition challenging due to patient body habitus. technically limited imaging-pt supine due to left lateral drains. IMPRESSIONS  1. Left ventricular ejection fraction, by estimation, is 55 to 60%. The left ventricle has normal function. There is mild concentric left ventricular hypertrophy.  2. Right ventricular systolic function is normal. The right ventricular size is normal. Tricuspid regurgitation signal is inadequate for assessing PA pressure.  3. The mitral valve is normal in structure. Trivial mitral valve regurgitation. Comparison(s): No significant change from prior study. FINDINGS  Left Ventricle: Left ventricular ejection fraction, by estimation, is 55 to 60%. The left ventricle has normal function. The left ventricular internal cavity size was normal in size. There is mild concentric left ventricular hypertrophy. Right Ventricle: The right ventricular size is normal. No increase in right ventricular wall thickness. Right ventricular systolic function is normal. Tricuspid regurgitation signal is inadequate for assessing PA pressure. Right Atrium: Right atrial size was normal in size. Mitral Valve: The mitral valve is normal in structure. Trivial mitral valve regurgitation. Tricuspid Valve: The tricuspid valve is normal in structure. Tricuspid valve regurgitation is trivial. LEFT VENTRICLE PLAX 2D LVIDd:          4.95 cm LVIDs:         4.10 cm LV  PW:         1.20 cm LV IVS:        1.20 cm  LV Volumes (MOD) LV vol d, MOD A2C: 102.0 ml LV vol d, MOD A4C: 88.0 ml LV vol s, MOD A2C: 59.8 ml LV vol s, MOD A4C: 57.5 ml LV SV MOD A2C:     42.2 ml LV SV MOD A4C:     88.0 ml LV SV MOD BP:      40.4 ml LEFT ATRIUM         Index LA diam:    3.30 cm 1.51 cm/m [name] MD Electronically signed by [name] MD Signature Date/Time: 05/12/2022/12:40:56 PM    Final    DG CHEST PORT 1 VIEW  Result Date: 05/11/2022 CLINICAL DATA:  [address] Wheezing [address] EXAM: PORTABLE CHEST 1 VIEW COMPARISON:  Chest x-ray 12/28/2021 FINDINGS: The heart and mediastinal contours are within normal limits. Right costophrenic angle airspace opacity. Increased interstitial markings. No pleural effusion. No pneumothorax. No acute osseous abnormality. IMPRESSION: 1. Right costophrenic angle airspace opacity. Followup PA and lateral chest X-ray is recommended in 3-4 weeks following therapy to ensure resolution and exclude underlying malignancy. 2. Increased interstitial markings suggestive of bronchitic changes. Electronically Signed   By: Tish Frederickson M.D.   On: 05/11/2022 18:47   CT GUIDED PERITONEAL/RETROPERITONEAL FLUID DRAIN BY PERC CATH  Result Date: 05/11/2022 INDICATION: 52 year old with a recurrent abdominal abscess. History of abdominal abscesses and percutaneous drains. Drainage from the old percutaneous drain site. EXAM: CT-GUIDED PLACEMENT OF DRAIN INTO LOWER ABDOMINAL ABSCESS TECHNIQUE: Multidetector CT imaging of the abdomen and pelvis was performed following the standard protocol without IV contrast. RADIATION DOSE REDUCTION: This exam was performed according to the departmental dose-optimization program which includes automated exposure control, adjustment of the mA and/or kV according to patient size and/or use of iterative reconstruction technique. MEDICATIONS: The patient is currently admitted to the hospital and receiving  intravenous antibiotics. The antibiotics were administered within an appropriate time frame prior to the initiation of the procedure. ANESTHESIA/SEDATION: Moderate (conscious) sedation was employed during this procedure. A total of Versed 3.5 mg and Fentanyl 200 mcg was administered intravenously by the radiology nurse. Total intra-service moderate Sedation Time: 35 minutes. The patient's level of consciousness and vital signs were monitored continuously by radiology nursing throughout the procedure under my direct supervision. COMPLICATIONS: None immediate. PROCEDURE: Informed written consent was obtained from the patient after a thorough discussion of the procedural risks, benefits and alternatives. All questions were addressed. Maximal Sterile Barrier Technique was utilized including caps, mask, sterile gowns, sterile gloves, sterile drape, hand hygiene and skin antiseptic. A timeout was performed prior to the initiation of the procedure. Patient was placed supine on CT scanner. Images through the abdomen and pelvis were obtained. The air-fluid collection near the midline of the lower abdomen was identified and targeted for drainage. The patient also has drainage from the old drain site in the left lower abdomen. The left lower abdomen was prepped with chlorhexidine and sterile field was created. Skin was anesthetized using 1% lidocaine. Small incision was made. Using CT guidance, an 18 gauge trocar needle was directed into the gas-filled collection in the lower abdomen. Yellow purulent fluid was aspirated. Superstiff Amplatz wire was placed. Tract was dilated to accommodate a 12 [place] multipurpose drain. Total of 80 mL of yellow/green fluid was aspirated. Drain was sutured to skin and attached to a suction bulb. Dressing was placed. FINDINGS: 15 French drain was placed  within the lower abdominal abscess collection. 80 mL of fluid was removed. 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 Ultrasound was used to evaluate the left lateral abdominal wall near the old drain site to look for a drainable fluid collection. There is no drainable collection in this area at this time. IMPRESSION: 1. CT-guided placement a drainage catheter within the lower abdominal abscess. Purulent fluid was removed. 2. 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 3. Bloody drainage from the old drain site in the left lower abdomen. The subcutaneous and abdominal wall area was evaluated with ultrasound. There is not a discrete fluid collection in this area that is drainable. Electronically Signed   By: Richarda Overlie M.D.   On: 05/11/2022 17:39        Scheduled Meds:  ALPRAZolam  1 mg Oral TID   amphetamine-dextroamphetamine  20 mg Oral QID   aspirin EC  81 mg Oral Daily   atorvastatin  20 mg Oral Daily   cariprazine  6 mg Oral QPM   Dextromethorphan-quiNIDine  1 capsule Oral Q12H   enoxaparin (LOVENOX) injection  50 mg Subcutaneous Q24H   furosemide  40 mg Oral BID   insulin aspart  0-15 Units Subcutaneous TID WC   losartan  25 mg Oral BID   potassium chloride  40 mEq Oral Q4H   potassium chloride SA  20 mEq Oral Daily   pregabalin  300 mg Oral BID   sodium chloride flush  5 mL Intracatheter Q8H   Continuous Infusions:  piperacillin-tazobactam (ZOSYN)  IV 3.375 g (05/13/22 0458)     LOS: 3 days    Time spent: 35 minutes    Ramiro Harvest, MD [facility]   To contact the attending provider between 7A-7P or the covering provider during after hours 7P-7A, please log into the web site [email or web address] and access using universal [place] password for that web site. If you do not have the password, please call the hospital operator.  05/13/2022, 11:59 AM

## 2022-05-13 NOTE — Progress Notes (Signed)
Subjective/Chief Complaint: No new complaints Drain - 65 cc output   Objective: Vital signs in last 24 hours: Temp:  [97.4 F (36.3 C)-98.4 F (36.9 C)] 97.4 F (36.3 C) (04/14 0815) Pulse Rate:  [55-69] 55 (04/14 0815) Resp:  [13-18] 18 (04/14 0815) BP: (101-117)/(57-79) 117/79 (04/14 0815) SpO2:  [89 %-99 %] 99 % (04/14 0815) Weight:  [111.6 kg-111.8 kg] 111.8 kg (04/14 0928) Last BM Date : 05/10/22  Intake/Output from previous day: 04/13 0701 - 04/14 0700 In: 240 [P.O.:240] Out: 2915 [Urine:2850; Drains:65] Intake/Output this shift: Total I/O In: -  Out: 750 [Urine:750]  WDWN in NAD Abd - soft, tender around drain Serosanguinous output  Lab Results:  Recent Labs    05/12/22 0257 05/13/22 0502  WBC 4.9 4.8  HGB 13.2 12.7  HCT 41.2 40.4  PLT 371 400   BMET Recent Labs    05/12/22 0257 05/13/22 0502  NA 139 136  K 3.7 3.2*  CL 98 98  CO2 28 31  GLUCOSE 120* 133*  BUN 12 6  CREATININE 0.81 0.95  CALCIUM 9.4 8.8*   PT/INR Recent Labs    05/11/22 1118  LABPROT 13.9  INR 1.1   ABG No results for input(s): "PHART", "HCO3" in the last 72 hours.  Invalid input(s): "PCO2", "PO2"  Studies/Results: ECHOCARDIOGRAM LIMITED  Result Date: 05/12/2022    ECHOCARDIOGRAM LIMITED REPORT   Patient Name:   [name] Date of Exam: 05/12/2022 Medical Rec #:  [id number]           Height:       65.0 in Accession #:    [id number]          Weight:       252.9 lb Date of Birth:  04-Dec-1970          BSA:          2.185 m Patient Age:    51 years            BP:           137/71 mmHg Patient Gender: F                   HR:           56 bpm. Exam Location:  Inpatient Procedure: Limited Echo and Color Doppler Indications:    assess LVF  History:        Patient has prior history of Echocardiogram examinations, most                 recent 03/23/2020. CHF; Risk Factors:Diabetes and Hypertension.  Sonographer:    Melissa Morford RDCS (AE, PE) Referring Phys: [id number]  HEATHER E PEMBERTON  Sonographer Comments: Technically challenging study due to limited acoustic windows. Image acquisition challenging due to patient body habitus. technically limited imaging-pt supine due to left lateral drains. IMPRESSIONS  1. Left ventricular ejection fraction, by estimation, is 55 to 60%. The left ventricle has normal function. There is mild concentric left ventricular hypertrophy.  2. Right ventricular systolic function is normal. The right ventricular size is normal. Tricuspid regurgitation signal is inadequate for assessing PA pressure.  3. The mitral valve is normal in structure. Trivial mitral valve regurgitation. Comparison(s): No significant change from prior study. FINDINGS  Left Ventricle: Left ventricular ejection fraction, by estimation, is 55 to 60%. The left ventricle has normal function. The left ventricular internal cavity size was normal in size. There is mild concentric left ventricular hypertrophy. Right Ventricle: The  right ventricular size is normal. No increase in right ventricular wall thickness. Right ventricular systolic function is normal. Tricuspid regurgitation signal is inadequate for assessing PA pressure. Right Atrium: Right atrial size was normal in size. Mitral Valve: The mitral valve is normal in structure. Trivial mitral valve regurgitation. Tricuspid Valve: The tricuspid valve is normal in structure. Tricuspid valve regurgitation is trivial. LEFT VENTRICLE PLAX 2D LVIDd:         4.95 cm LVIDs:         4.10 cm LV PW:         1.20 cm LV IVS:        1.20 cm  LV Volumes (MOD) LV vol d, MOD A2C: 102.0 ml LV vol d, MOD A4C: 88.0 ml LV vol s, MOD A2C: 59.8 ml LV vol s, MOD A4C: 57.5 ml LV SV MOD A2C:     42.2 ml LV SV MOD A4C:     88.0 ml LV SV MOD BP:      40.4 ml LEFT ATRIUM         Index LA diam:    3.30 cm 1.51 cm/m [name] MD Electronically signed by [name] MD Signature Date/Time: 05/12/2022/12:40:56 PM    Final    DG CHEST PORT 1  VIEW  Result Date: 05/11/2022 CLINICAL DATA:  10028 Wheezing [id number] EXAM: PORTABLE CHEST 1 VIEW COMPARISON:  Chest x-ray 12/28/2021 FINDINGS: The heart and mediastinal contours are within normal limits. Right costophrenic angle airspace opacity. Increased interstitial markings. No pleural effusion. No pneumothorax. No acute osseous abnormality. IMPRESSION: 1. Right costophrenic angle airspace opacity. Followup PA and lateral chest X-ray is recommended in 3-4 weeks following therapy to ensure resolution and exclude underlying malignancy. 2. Increased interstitial markings suggestive of bronchitic changes. Electronically Signed   By: Tish Frederickson M.D.   On: 05/11/2022 18:47   CT GUIDED PERITONEAL/RETROPERITONEAL FLUID DRAIN BY PERC CATH  Result Date: 05/11/2022 INDICATION: 52 year old with a recurrent abdominal abscess. History of abdominal abscesses and percutaneous drains. Drainage from the old percutaneous drain site. EXAM: CT-GUIDED PLACEMENT OF DRAIN INTO LOWER ABDOMINAL ABSCESS TECHNIQUE: Multidetector CT imaging of the abdomen and pelvis was performed following the standard protocol without IV contrast. RADIATION DOSE REDUCTION: This exam was performed according to the departmental dose-optimization program which includes automated exposure control, adjustment of the mA and/or kV according to patient size and/or use of iterative reconstruction technique. MEDICATIONS: The patient is currently admitted to the hospital and receiving intravenous antibiotics. The antibiotics were administered within an appropriate time frame prior to the initiation of the procedure. ANESTHESIA/SEDATION: Moderate (conscious) sedation was employed during this procedure. A total of Versed 3.5 mg and Fentanyl 200 mcg was administered intravenously by the radiology nurse. Total intra-service moderate Sedation Time: 35 minutes. The patient's level of consciousness and vital signs were monitored continuously by radiology nursing  throughout the procedure under my direct supervision. COMPLICATIONS: None immediate. PROCEDURE: Informed written consent was obtained from the patient after a thorough discussion of the procedural risks, benefits and alternatives. All questions were addressed. Maximal Sterile Barrier Technique was utilized including caps, mask, sterile gowns, sterile gloves, sterile drape, hand hygiene and skin antiseptic. A timeout was performed prior to the initiation of the procedure. Patient was placed supine on CT scanner. Images through the abdomen and pelvis were obtained. The air-fluid collection near the midline of the lower abdomen was identified and targeted for drainage. The patient also has drainage from the old drain site in the left lower  abdomen. The left lower abdomen was prepped with chlorhexidine and sterile field was created. Skin was anesthetized using 1% lidocaine. Small incision was made. Using CT guidance, an 18 gauge trocar needle was directed into the gas-filled collection in the lower abdomen. Yellow purulent fluid was aspirated. Superstiff Amplatz wire was placed. Tract was dilated to accommodate a 12 [place] multipurpose drain. Total of 80 mL of yellow/green fluid was aspirated. Drain was sutured to skin and attached to a suction bulb. Dressing was placed. FINDINGS: 18 French drain was placed within the lower abdominal abscess collection. 80 mL of fluid was removed. 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 Ultrasound was used to evaluate the left lateral abdominal wall near the old drain site to look for a drainable fluid collection. There is no drainable collection in this area at this time. IMPRESSION: 1. CT-guided placement a drainage catheter within the lower abdominal abscess. Purulent fluid was removed. 2. 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 3. Bloody drainage from the old  drain site in the left lower abdomen. The subcutaneous and abdominal wall area was evaluated with ultrasound. There is not a discrete fluid collection in this area that is drainable. Electronically Signed   By: Richarda Overlie M.D.   On: 05/11/2022 17:39    Anti-infectives: Anti-infectives (From admission, onward)    Start     Dose/Rate Route Frequency Ordered Stop   05/10/22 2100  piperacillin-tazobactam (ZOSYN) IVPB 3.375 g        3.375 g 12.5 mL/hr over 240 Minutes Intravenous Every 8 hours 05/10/22 2013         Assessment/Plan: H/o laparoscopic peritoneal lavage with drain placement on 12/11/2021 Dr. Dossie Der for diverticulitis  H/o multiple prior IR drain placements for IAA. Last one removed 3/20 Recurrent IAA Lower abdominal midline: 7.5 x 4.3 cm, previously 2.2 x 2.2 cm with  fistulous connection with the inflamed sigmoid colon Communicating collection extending into rectouterine pouch 6.8 x 3.2 cm   - last drain which had prev been in communication with the colon was removed 04/18/2022 after CT drain study showing no residual abscess or fistula. 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 Recommend IR drainage for now and at this point will likely require surgical resection this admission which would entail colostomy creation. She has history of CHF and last echo 2022. She says she is due for cardiology follow up. She may need preoperative cardiology clearance. We will follow   FEN: Can have CLD, ensure ID: [email or web address] VTE: heparin subq   Chronic combined systolic and diastolic CHF (congestive heart failure) - last echo 2022 with LVEF 60-65% Essential hypertension, benign OSA on CPAP Type 2 diabetes mellitus with other specified complication Bipolar disorder Morbid obesity  LOS: 3 days    Wynona Luna 05/13/2022

## 2022-05-13 NOTE — Progress Notes (Signed)
Pharmacy Antibiotic Note  [name] is a 52 y.o. female admitted on 05/10/2022 with  intra-abdominal infection .  Pharmacy has been consulted for Zosyn dosing.  Patient had diverticulitis a couple of months ago with abdominal drain placed at that time. Her drain was pulled about 3 weeks ago and she reports having drainage and pain from that site (started a couple of days ago). CT abd/pelvis concerning for abscess and fistulous formation. Calculated CrCl 86.3, Renal function has remained stable since admission.  Plan: Continue Zosyn 3.375g IV q8h (4 hour infusion). Monitor renal function and overall clinical picture   Height: 5\' 5"  (165.1 cm) Weight: 111.8 kg (246 lb 6.4 oz) IBW/kg (Calculated) : 57  Temp (24hrs), Avg:97.8 F (36.6 C), Min:97.4 F (36.3 C), Max:98.4 F (36.9 C)  Recent Labs  Lab 05/10/22 1448 05/11/22 0544 05/12/22 0257 05/13/22 0502  WBC 9.8 6.5 4.9 4.8  CREATININE 0.77 0.76 0.81 0.95     Estimated Creatinine Clearance: 86.3 mL/min (by C-G formula based on SCr of 0.95 mg/dL).    Allergies  Allergen Reactions   Savella [Milnacipran Hcl] Other (See Comments)    mania    Antimicrobials this admission: 4/11 Zosyn >>    Microbiology results: 4/12 abscess: Moderate E. Coli > R to ampicillin, unasyn  Thank you for allowing pharmacy to be a part of this patient's care.  Rennis Petty, PharmD PGY1 Pharmacy Resident 05/13/2022 12:25 PM

## 2022-05-14 DIAGNOSIS — I420 Dilated cardiomyopathy: Secondary | ICD-10-CM

## 2022-05-14 DIAGNOSIS — F319 Bipolar disorder, unspecified: Secondary | ICD-10-CM | POA: Diagnosis not present

## 2022-05-14 DIAGNOSIS — I10 Essential (primary) hypertension: Secondary | ICD-10-CM | POA: Diagnosis not present

## 2022-05-14 DIAGNOSIS — E1169 Type 2 diabetes mellitus with other specified complication: Secondary | ICD-10-CM | POA: Diagnosis not present

## 2022-05-14 DIAGNOSIS — K651 Peritoneal abscess: Secondary | ICD-10-CM | POA: Diagnosis not present

## 2022-05-14 LAB — BASIC METABOLIC PANEL
Anion gap: 9 (ref 5–15)
BUN: 5 mg/dL — ABNORMAL LOW (ref 6–20)
CO2: 33 mmol/L — ABNORMAL HIGH (ref 22–32)
Calcium: 9.1 mg/dL (ref 8.9–10.3)
Chloride: 96 mmol/L — ABNORMAL LOW (ref 98–111)
Creatinine, Ser: 0.91 mg/dL (ref 0.44–1.00)
GFR, Estimated: >60 mL/min (ref >60)
Glucose, Bld: 137 mg/dL — ABNORMAL HIGH (ref 70–99)
Potassium: 3.9 mmol/L (ref 3.5–5.1)
Sodium: 138 mmol/L (ref 135–145)

## 2022-05-14 LAB — CBC WITH DIFFERENTIAL/PLATELET
Abs Immature Granulocytes: 0 10*3/uL (ref 0.00–0.07)
Basophils Absolute: 0.1 10*3/uL (ref 0.0–0.1)
Basophils Relative: 2 %
Eosinophils Absolute: 0.2 10*3/uL (ref 0.0–0.5)
Eosinophils Relative: 5 %
HCT: 44.2 % (ref 36.0–46.0)
Hemoglobin: 14.3 g/dL (ref 12.0–15.0)
Lymphocytes Relative: 23 %
Lymphs Abs: 1.1 10*3/uL (ref 0.7–4.0)
MCH: 28.4 pg (ref 26.0–34.0)
MCHC: 32.4 g/dL (ref 30.0–36.0)
MCV: 87.9 fL (ref 80.0–100.0)
Monocytes Absolute: 0.2 10*3/uL (ref 0.1–1.0)
Monocytes Relative: 5 %
Neutro Abs: 3 10*3/uL (ref 1.7–7.7)
Neutrophils Relative %: 65 %
Platelets: 442 10*3/uL — ABNORMAL HIGH (ref 150–400)
RBC: 5.03 MIL/uL (ref 3.87–5.11)
RDW: 15 % (ref 11.5–15.5)
WBC: 4.6 10*3/uL (ref 4.0–10.5)
nRBC: 0 % (ref 0.0–0.2)
nRBC: 0 /100 WBC

## 2022-05-14 LAB — GLUCOSE, CAPILLARY
Glucose-Capillary: 117 mg/dL — ABNORMAL HIGH (ref 70–99)
Glucose-Capillary: 126 mg/dL — ABNORMAL HIGH (ref 70–99)
Glucose-Capillary: 128 mg/dL — ABNORMAL HIGH (ref 70–99)
Glucose-Capillary: 139 mg/dL — ABNORMAL HIGH (ref 70–99)

## 2022-05-14 LAB — MAGNESIUM: Magnesium: 2 mg/dL (ref 1.7–2.4)

## 2022-05-14 LAB — AEROBIC/ANAEROBIC CULTURE W GRAM STAIN (SURGICAL/DEEP WOUND): Gram Stain: NONE SEEN

## 2022-05-14 MED ORDER — FLUTICASONE PROPIONATE 50 MCG/ACT NA SUSP
2.0000 | Freq: Every day | NASAL | Status: DC
  Administered 2022-05-14 – 2022-05-15 (×2): 2 via NASAL
  Filled 2022-05-14: qty 16

## 2022-05-14 MED ORDER — DOCUSATE SODIUM 100 MG PO CAPS
100.0000 mg | ORAL_CAPSULE | Freq: Two times a day (BID) | ORAL | Status: DC
  Administered 2022-05-14: 100 mg via ORAL
  Filled 2022-05-14 (×3): qty 1

## 2022-05-14 MED ORDER — LORATADINE 10 MG PO TABS
10.0000 mg | ORAL_TABLET | Freq: Every day | ORAL | Status: DC
  Administered 2022-05-14 – 2022-05-15 (×2): 10 mg via ORAL
  Filled 2022-05-14 (×2): qty 1

## 2022-05-14 NOTE — Progress Notes (Signed)
Subjective/Chief Complaint: Tolerated clears with good appetite and no complaints. Last BM thursday  Objective: Vital signs in last 24 hours: Temp:  [97.4 F (36.3 C)-98 F (36.7 C)] 98 F (36.7 C) (04/15 0500) Pulse Rate:  [55-67] 67 (04/15 0500) Resp:  [16-18] 16 (04/15 0500) BP: (94-117)/(66-79) 94/75 (04/15 0500) SpO2:  [95 %-100 %] 95 % (04/15 0500) FiO2 (%):  [21 %] 21 % (04/15 0500) Weight:  [111.8 kg] 111.8 kg (04/14 0928) Last BM Date : 05/10/22  Intake/Output from previous day: 04/14 0701 - 04/15 0700 In: 288.4 [IV Piggyback:288.4] Out: 5040 [Urine:4575; Drains:465] Intake/Output this shift: No intake/output data recorded.  WDWN in NAD Abd - soft, mild TTP around drain with SS output. Old drain site with purulence tinged bloody drainage  Lab Results:  Recent Labs    05/13/22 0502 05/14/22 0350  WBC 4.8 4.6  HGB 12.7 14.3  HCT 40.4 44.2  PLT 400 442*    BMET Recent Labs    05/13/22 0502 05/14/22 0350  NA 136 138  K 3.2* 3.9  CL 98 96*  CO2 31 33*  GLUCOSE 133* 137*  BUN 6 5*  CREATININE 0.95 0.91  CALCIUM 8.8* 9.1    PT/INR Recent Labs    05/11/22 1118  LABPROT 13.9  INR 1.1    ABG No results for input(s): "PHART", "HCO3" in the last 72 hours.  Invalid input(s): "PCO2", "PO2"  Studies/Results: ECHOCARDIOGRAM LIMITED  Result Date: 05/12/2022    ECHOCARDIOGRAM LIMITED REPORT   Patient Name:   [name] Date of Exam: 05/12/2022 Medical Rec #:  [id number]           Height:       65.0 in Accession #:    [id number]          Weight:       252.9 lb Date of Birth:  August 15, 1970          BSA:          2.185 m Patient Age:    51 years            BP:           137/71 mmHg Patient Gender: F                   HR:           56 bpm. Exam Location:  Inpatient Procedure: Limited Echo and Color Doppler Indications:    assess LVF  History:        Patient has prior history of Echocardiogram examinations, most                 recent 03/23/2020. CHF;  Risk Factors:Diabetes and Hypertension.  Sonographer:    Melissa Morford RDCS (AE, PE) Referring Phys: [id number] HEATHER E PEMBERTON  Sonographer Comments: Technically challenging study due to limited acoustic windows. Image acquisition challenging due to patient body habitus. technically limited imaging-pt supine due to left lateral drains. IMPRESSIONS  1. Left ventricular ejection fraction, by estimation, is 55 to 60%. The left ventricle has normal function. There is mild concentric left ventricular hypertrophy.  2. Right ventricular systolic function is normal. The right ventricular size is normal. Tricuspid regurgitation signal is inadequate for assessing PA pressure.  3. The mitral valve is normal in structure. Trivial mitral valve regurgitation. Comparison(s): No significant change from prior study. FINDINGS  Left Ventricle: Left ventricular ejection fraction, by estimation, is 55 to 60%. The left ventricle has normal function.  The left ventricular internal cavity size was normal in size. There is mild concentric left ventricular hypertrophy. Right Ventricle: The right ventricular size is normal. No increase in right ventricular wall thickness. Right ventricular systolic function is normal. Tricuspid regurgitation signal is inadequate for assessing PA pressure. Right Atrium: Right atrial size was normal in size. Mitral Valve: The mitral valve is normal in structure. Trivial mitral valve regurgitation. Tricuspid Valve: The tricuspid valve is normal in structure. Tricuspid valve regurgitation is trivial. LEFT VENTRICLE PLAX 2D LVIDd:         4.95 cm LVIDs:         4.10 cm LV PW:         1.20 cm LV IVS:        1.20 cm  LV Volumes (MOD) LV vol d, MOD A2C: 102.0 ml LV vol d, MOD A4C: 88.0 ml LV vol s, MOD A2C: 59.8 ml LV vol s, MOD A4C: 57.5 ml LV SV MOD A2C:     42.2 ml LV SV MOD A4C:     88.0 ml LV SV MOD BP:      40.4 ml LEFT ATRIUM         Index LA diam:    3.30 cm 1.51 cm/m [name] MD Electronically  signed by [name] MD Signature Date/Time: 05/12/2022/12:40:56 PM    Final     Anti-infectives: Anti-infectives (From admission, onward)    Start     Dose/Rate Route Frequency Ordered Stop   05/10/22 2100  piperacillin-tazobactam (ZOSYN) IVPB 3.375 g        3.375 g 12.5 mL/hr over 240 Minutes Intravenous Every 8 hours 05/10/22 2013         Assessment/Plan: H/o laparoscopic peritoneal lavage with drain placement on 12/11/2021 Dr. Dossie Der for diverticulitis  H/o multiple prior IR drain placements for IAA. Last one removed 3/20 Recurrent IAA Lower abdominal midline: 7.5 x 4.3 cm, previously 2.2 x 2.2 cm with  fistulous connection with the inflamed sigmoid colon Communicating collection extending into rectouterine pouch 6.8 x 3.2 cm   - last drain which had prev been in communication with the colon was removed 04/18/2022 after CT drain study showing no residual abscess or fistula. 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  - s/p IR drain 4/12, 465 ml/24H documented - SS. Cx with E. coli - at this point will likely require surgical resection this admission which would entail colostomy creation. - cards has seen for preop clearance - no further testing indicated. Cont lasix   FEN: CLD, ensure, colace ID: [email or web address] VTE: heparin subq   Chronic combined systolic and diastolic CHF (congestive heart failure) - last echo 2022 with LVEF 60-65% Essential hypertension, benign OSA on CPAP Type 2 diabetes mellitus with other specified complication Bipolar disorder Morbid obesity   LOS: 4 days    Eric Form, [facility] 05/14/2022, 7:59 AM Please see Amion for pager number during day hours 7:00am-4:30pm

## 2022-05-14 NOTE — Progress Notes (Addendum)
PROGRESS NOTE    [name]  BZJ:[id number] DOB: 1970-12-06 DOA: 05/10/2022 PCP: [place], Betty G, MD    Chief Complaint  Patient presents with   Abdominal Pain    Brief Narrative:  Patient is a pleasant 52 year old female history of morbid obesity, diet-controlled type 2 diabetes, hypertension, OSA on CPAP, bipolar disorder, history of perforated diverticulitis status post multiple drains November 2023 presented to the ED with increasing abdominal pain and drainage from prior drain site for the past 3 to 5 days.  Patient noted to have had last intra-abdominal drain pulled at the end of March.  Patient seen in the ED CT abdomen and pelvis done concerning for abscesses as well as fistulous connection with inflamed sigmoid colon.  Patient placed empirically on IV antibiotics.  General surgery consulted.   Assessment & Plan:   Principal Problem:   Intra-abdominal abscess Active Problems:   Morbid obesity   Bipolar disorder   Type 2 diabetes mellitus with other specified complication   OSA on CPAP   Essential hypertension, benign   Chronic combined systolic and diastolic CHF (congestive heart failure)  #1 intra-abdominal abscesses/fistulous connection with sigmoid colon -Patient with prior history of perforated diverticulitis status post multiple drain placement in November 2023, last drain removed end of March presented with a 3 to 5-day history of abdominal pain and increased drainage from prior drainage site. -CT abdomen and pelvis done concerning for abscesses and fistulous formation. -Admitting physician discussed with patient that patient may need surgery at this time for sigmoid colectomy however defer to general surgeon. -Patient seen in consultation by general surgery, who recommended IR evaluation for drain placement which was done 05/11/2022 with cultures growing moderate E. coli, and recommending that patient will likely eventually need colectomy.   -Drain output  noted of 465 cc over the past 24 hours.  -Patient also seen in consultation by cardiology for cardiac clearance. -Per cardiology patient mild to moderate risk for cardiovascular complications for any abdominal surgeries needed, no indication for Myoview at this time per cardiology. -Patient currently on clears.  -Continue empiric IV Zosyn. -Per general surgery.  2.  Chronic combined systolic and diastolic CHF, POA -Currently euvolemic. -Patient diuretics of Lasix was held initially on presentation, as patient was made n.p.o. and patient placed on gentle hydration.   -Patient resumed back on Lasix 40 mg twice daily.  Patient noted to be on Lasix 80 mg in the morning and 40 mg in the evenings prior to admission.   -Cozaar resumed. -Cardiology following.  3.  Hypertension -BP noted to be soft early on in the hospitalization, and Lasix and Cozaar initially held.   -Currently on clears.   -BP improved. -Lasix and Cozaar resumed.   4.  Type 2 diabetes mellitus, diet controlled -Hemoglobin A1c noted at 5.9 (11/14/2021) -CBG 117 this morning. -Hemoglobin A1c 7.3 (05/12/2022). -SSI.  5.  OSA -CPAP nightly.   6.  Morbid obesity -Lifestyle modification. -Outpatient follow-up PCP.  7.  Bipolar disorder -Continue home regimen psych medications.  8.  Hyperlipidemia -Statin.  9.  Hypokalemia -Secondary to diuresis.   -Repleted. -Continue oral daily potassium supplementation.  10.  Congestion -Patient with nasal congestion today. -Place on Claritin, Flonase.   DVT prophylaxis: Heparin>>>> Lovenox Code Status: Full Family Communication: Updated patient.  No family at bedside. Disposition: Likely home when clinically improved, cleared by general surgery.  Status is: Inpatient Remains inpatient appropriate because: Severity of illness   Consultants:  General surgery: Dr. Dwain Sarna 05/11/2022  Interventional radiology: Dr. Lowella Dandy 05/11/2022 Cardiology: Dr. Shari Prows 05/11/2022 Wound  care RN: Ria Bush, RN 05/11/2022  Procedures:  CT abdomen and pelvis 05/10/2022 CT-guided placement of lower abdominal abscess drain per IR: Dr. Lowella Dandy 05/11/2022 2D echo 05/12/2022   Antimicrobials:  IV Zosyn 05/10/2022>>>>   Subjective: Sleeping but arousable.  Congested states that after using CPAP mask provided by the hospital.  No chest pain.  No shortness of breath.  Abdominal pain/discomfort improving after drain placement.  Tolerating clear liquids.    Objective: Vitals:   05/13/22 2131 05/14/22 0138 05/14/22 0500 05/14/22 0820  BP:   94/75 135/84  Pulse:   67 60  Resp:   16 16  Temp:   98 F (36.7 C) (!) 97.5 F (36.4 C)  TempSrc:   Axillary   SpO2: 100% 100% 95% 95%  Weight:      Height:        Intake/Output Summary (Last 24 hours) at 05/14/2022 1235 Last data filed at 05/14/2022 1210 Gross per 24 hour  Intake 528.36 ml  Output 4465 ml  Net -3936.64 ml    Filed Weights   05/12/22 0600 05/12/22 2103 05/13/22 0928  Weight: 114.7 kg 111.6 kg 111.8 kg    Examination:  General exam: NAD.  Respiratory system: Lungs clear to auscultation bilaterally.  No wheezes, no crackles, no rhonchi.  Fair air movement.  Speaking in full sentences.  Nasal congestion. Cardiovascular system: Regular rate rhythm no murmurs rubs or gallops.  No JVD.  No lower extremity edema. Gastrointestinal system: Abdomen is soft, nontender, nondistended, positive bowel sounds.  Dressing noted in left lower quadrant.  Left lower quadrant drain with bloody drainage noted.  Central nervous system: Alert and oriented. No focal neurological deficits. Extremities: Symmetric 5 x 5 power. Skin: No rashes, lesions or ulcers Psychiatry: Judgement and insight appear normal. Mood & affect appropriate.     Data Reviewed: I have personally reviewed following labs and imaging studies  CBC: Recent Labs  Lab 05/10/22 1448 05/11/22 0544 05/12/22 0257 05/13/22 0502 05/14/22 0350  WBC 9.8 6.5 4.9 4.8  4.6  NEUTROABS 7.9* 4.0  --  2.7 3.0  HGB 15.0 13.6 13.2 12.7 14.3  HCT 45.7 41.5 41.2 40.4 44.2  MCV 87.7 87.4 89.0 89.0 87.9  PLT 381 362 371 400 442*     Basic Metabolic Panel: Recent Labs  Lab 05/10/22 1448 05/11/22 0544 05/12/22 0257 05/13/22 0502 05/14/22 0350  NA 136 139 139 136 138  K 3.6 3.6 3.7 3.2* 3.9  CL 99 103 98 98 96*  CO2 23 26 28 31  33*  GLUCOSE 208* 122* 120* 133* 137*  BUN 15 15 12 6  5*  CREATININE 0.77 0.76 0.81 0.95 0.91  CALCIUM 9.5 9.2 9.4 8.8* 9.1  MG  --  1.8 1.7 1.8 2.0     GFR: Estimated Creatinine Clearance: 90.1 mL/min (by C-G formula based on SCr of 0.91 mg/dL).  Liver Function Tests: Recent Labs  Lab 05/10/22 1448 05/11/22 0544 05/12/22 0257  AST 29 33 19  ALT 27 29 22   ALKPHOS 95 79 74  BILITOT 0.9 0.8 0.9  PROT 6.8 6.2* 6.0*  ALBUMIN 2.7* 2.3* 2.2*     CBG: Recent Labs  Lab 05/13/22 1213 05/13/22 1639 05/13/22 2016 05/14/22 0822 05/14/22 1225  GLUCAP 109* 150* 116* 117* 126*      Recent Results (from the past 240 hour(s))  Aerobic/Anaerobic Culture w Gram Stain (surgical/deep wound)     Status: None (Preliminary result)  Collection Time: 05/11/22  1:30 PM   Specimen: Abscess  Result Value Ref Range Status   Specimen Description ABSCESS  Final   Special Requests LLQ  Final   Gram Stain   Final    NO WBC SEEN FEW GRAM POSITIVE COCCI FEW GRAM NEGATIVE RODS    Culture   Final    MODERATE ESCHERICHIA COLI CULTURE REINCUBATED FOR BETTER GROWTH HOLDING FOR POSSIBLE ANAEROBE Performed at [facility], [address], [place], [place] [address]    Report Status PENDING  Incomplete   Organism ID, Bacteria ESCHERICHIA COLI  Final      Susceptibility   Escherichia coli - MIC*    AMPICILLIN >=32 RESISTANT Resistant     CEFEPIME <=0.12 SENSITIVE Sensitive     CEFTAZIDIME <=1 SENSITIVE Sensitive     CEFTRIAXONE <=0.25 SENSITIVE Sensitive     CIPROFLOXACIN <=0.25 SENSITIVE Sensitive     GENTAMICIN <=1  SENSITIVE Sensitive     IMIPENEM <=0.25 SENSITIVE Sensitive     TRIMETH/SULFA <=20 SENSITIVE Sensitive     AMPICILLIN/SULBACTAM 16 INTERMEDIATE Intermediate     PIP/TAZO <=4 SENSITIVE Sensitive     * MODERATE ESCHERICHIA COLI         Radiology Studies: No results found.      Scheduled Meds:  ALPRAZolam  1 mg Oral TID   amphetamine-dextroamphetamine  20 mg Oral QID   aspirin EC  81 mg Oral Daily   atorvastatin  20 mg Oral Daily   cariprazine  6 mg Oral QPM   Dextromethorphan-quiNIDine  1 capsule Oral Q12H   docusate sodium  100 mg Oral BID   enoxaparin (LOVENOX) injection  50 mg Subcutaneous Q24H   furosemide  40 mg Oral BID   insulin aspart  0-15 Units Subcutaneous TID WC   losartan  25 mg Oral BID   potassium chloride SA  20 mEq Oral Daily   pregabalin  300 mg Oral BID   sodium chloride flush  5 mL Intracatheter Q8H   Continuous Infusions:  piperacillin-tazobactam (ZOSYN)  IV 3.375 g (05/14/22 0507)     LOS: 4 days    Time spent: 35 minutes    Ramiro Harvest, MD [facility]   To contact the attending provider between 7A-7P or the covering provider during after hours 7P-7A, please log into the web site [email or web address] and access using universal [place] password for that web site. If you do not have the password, please call the hospital operator.  05/14/2022, 12:35 PM

## 2022-05-14 NOTE — Care Management Important Message (Signed)
Important Message  Patient Details  Name: [name] MRN: [id number] Date of Birth: [birth year]   Medicare Important Message Given:  Yes     Catie Chiao Stefan Church 05/14/2022, 2:59 PM

## 2022-05-14 NOTE — Plan of Care (Signed)
Patient AOX4, VSS throughout shift.  All meds given on time as ordered.  Pt c/o pain relieved by PRN oxycodone and nausea relieved by PRN zofran.  Diminished lungs, IS encouraged.  Pt ambulated with steady gait to bathroom.  POC maintained, will continue to monitor.  Problem: Education: Goal: Ability to describe self-care measures that may prevent or decrease complications (Diabetes Survival Skills Education) will improve Outcome: Progressing Goal: Individualized Educational Video(s) Outcome: Progressing   Problem: Coping: Goal: Ability to adjust to condition or change in health will improve Outcome: Progressing   Problem: Fluid Volume: Goal: Ability to maintain a balanced intake and output will improve Outcome: Progressing   Problem: Health Behavior/Discharge Planning: Goal: Ability to identify and utilize available resources and services will improve Outcome: Progressing Goal: Ability to manage health-related needs will improve Outcome: Progressing   Problem: Metabolic: Goal: Ability to maintain appropriate glucose levels will improve Outcome: Progressing   Problem: Nutritional: Goal: Maintenance of adequate nutrition will improve Outcome: Progressing Goal: Progress toward achieving an optimal weight will improve Outcome: Progressing   Problem: Skin Integrity: Goal: Risk for impaired skin integrity will decrease Outcome: Progressing   Problem: Tissue Perfusion: Goal: Adequacy of tissue perfusion will improve Outcome: Progressing   Problem: Education: Goal: Knowledge of General Education information will improve Description: Including pain rating scale, medication(s)/side effects and non-pharmacologic comfort measures Outcome: Progressing   Problem: Health Behavior/Discharge Planning: Goal: Ability to manage health-related needs will improve Outcome: Progressing   Problem: Clinical Measurements: Goal: Ability to maintain clinical measurements within normal limits  will improve Outcome: Progressing Goal: Will remain free from infection Outcome: Progressing Goal: Diagnostic test results will improve Outcome: Progressing Goal: Respiratory complications will improve Outcome: Progressing Goal: Cardiovascular complication will be avoided Outcome: Progressing   Problem: Activity: Goal: Risk for activity intolerance will decrease Outcome: Progressing   Problem: Nutrition: Goal: Adequate nutrition will be maintained Outcome: Progressing   Problem: Coping: Goal: Level of anxiety will decrease Outcome: Progressing   Problem: Elimination: Goal: Will not experience complications related to bowel motility Outcome: Progressing Goal: Will not experience complications related to urinary retention Outcome: Progressing   Problem: Pain Managment: Goal: General experience of comfort will improve Outcome: Progressing   Problem: Safety: Goal: Ability to remain free from injury will improve Outcome: Progressing   Problem: Skin Integrity: Goal: Risk for impaired skin integrity will decrease Outcome: Progressing

## 2022-05-14 NOTE — Progress Notes (Signed)
Rounding Note    Patient Name: [name] Date of Encounter: 05/14/2022  [facility] Cardiologist: [name], MD   Subjective   52 yo with hx of morbid obesity, history of dilated cardiomyopathy, pulmonary hypertension, hypertension, obstructive sleep apnea, hyperlipidemia, diabetes mellitus.  She was admitted with an intra-abdominal as process.  We are asked to provide preoperative evaluation.  Echocardiogram from April 13 reveals normal left ventricular systolic function with EF of 55 to 60%.  There is mild concentric left ventricular perjury.  The right ventricular size and function were normal.  We were not able to assess pulmonary pressures because the tricuspid regurgitation signal was not adequate.  Inpatient Medications    Scheduled Meds:  ALPRAZolam  1 mg Oral TID   amphetamine-dextroamphetamine  20 mg Oral QID   aspirin EC  81 mg Oral Daily   atorvastatin  20 mg Oral Daily   cariprazine  6 mg Oral QPM   Dextromethorphan-quiNIDine  1 capsule Oral Q12H   docusate sodium  100 mg Oral BID   enoxaparin (LOVENOX) injection  50 mg Subcutaneous Q24H   furosemide  40 mg Oral BID   insulin aspart  0-15 Units Subcutaneous TID WC   losartan  25 mg Oral BID   potassium chloride SA  20 mEq Oral Daily   pregabalin  300 mg Oral BID   sodium chloride flush  5 mL Intracatheter Q8H   Continuous Infusions:  piperacillin-tazobactam (ZOSYN)  IV 3.375 g (05/14/22 0507)   PRN Meds: acetaminophen **OR** acetaminophen, albuterol, HYDROmorphone (DILAUDID) injection, levalbuterol, melatonin, ondansetron **OR** ondansetron (ZOFRAN) IV, oxyCODONE   Vital Signs    Vitals:   05/13/22 2131 05/14/22 0138 05/14/22 0500 05/14/22 0820  BP:   94/75 135/84  Pulse:   67 60  Resp:   16 16  Temp:   98 F (36.7 C) (!) 97.5 F (36.4 C)  TempSrc:   Axillary   SpO2: 100% 100% 95% 95%  Weight:      Height:        Intake/Output Summary (Last 24 hours) at 05/14/2022  0859 Last data filed at 05/14/2022 0507 Gross per 24 hour  Intake 288.36 ml  Output 5040 ml  Net -4751.64 ml      05/13/2022    9:28 AM 05/12/2022    9:03 PM 05/12/2022    6:00 AM  Last 3 Weights  Weight (lbs) 246 lb 6.4 oz 246 lb 0.5 oz 252 lb 13.9 oz  Weight (kg) 111.766 kg 111.6 kg 114.7 kg      Telemetry    Sinus rhythm  - Personally Reviewed  ECG     - Personally Reviewed  Physical Exam   GEN: Morbidly obese female, no acute distress.   Neck: No JVD Cardiac: RRR, no murmurs, rubs, or gallops.  Respiratory: Clear to auscultation bilaterally. GI: Soft, nontender, non-distended  MS: No edema; No deformity. Neuro:  Nonfocal  Psych: Normal affect   Labs    High Sensitivity Troponin:  No results for input(s): "TROPONINIHS" in the last 720 hours.   Chemistry Recent Labs  Lab 05/10/22 1448 05/10/22 1448 05/11/22 0544 05/12/22 0257 05/13/22 0502 05/14/22 0350  NA 136  --  139 139 136 138  K 3.6  --  3.6 3.7 3.2* 3.9  CL 99  --  103 98 98 96*  CO2 23  --  26 28 31  33*  GLUCOSE 208*  --  122* 120* 133* 137*  BUN 15  --  15 12 6  5*  CREATININE 0.77  --  0.76 0.81 0.95 0.91  CALCIUM 9.5  --  9.2 9.4 8.8* 9.1  MG  --    < > 1.8 1.7 1.8 2.0  PROT 6.8  --  6.2* 6.0*  --   --   ALBUMIN 2.7*  --  2.3* 2.2*  --   --   AST 29  --  33 19  --   --   ALT 27  --  29 22  --   --   ALKPHOS 95  --  79 74  --   --   BILITOT 0.9  --  0.8 0.9  --   --   GFRNONAA >60  --  >60 >60 >60 >60  ANIONGAP 14  --  10 13 7 9    < > = values in this interval not displayed.    Lipids No results for input(s): "CHOL", "TRIG", "HDL", "LABVLDL", "LDLCALC", "CHOLHDL" in the last 168 hours.  Hematology Recent Labs  Lab 05/12/22 0257 05/13/22 0502 05/14/22 0350  WBC 4.9 4.8 4.6  RBC 4.63 4.54 5.03  HGB 13.2 12.7 14.3  HCT 41.2 40.4 44.2  MCV 89.0 89.0 87.9  MCH 28.5 28.0 28.4  MCHC 32.0 31.4 32.4  RDW 15.3 15.2 15.0  PLT 371 400 442*   Thyroid No results for input(s): "TSH",  "FREET4" in the last 168 hours.  BNPNo results for input(s): "BNP", "PROBNP" in the last 168 hours.  DDimer No results for input(s): "DDIMER" in the last 168 hours.   Radiology    ECHOCARDIOGRAM LIMITED  Result Date: 05/12/2022    ECHOCARDIOGRAM LIMITED REPORT   Patient Name:   [name] Date of Exam: 05/12/2022 Medical Rec #:  [id number]           Height:       65.0 in Accession #:    [id number]          Weight:       252.9 lb Date of Birth:  [birth year]          BSA:          2.185 m Patient Age:    51 years            BP:           137/71 mmHg Patient Gender: F                   HR:           56 bpm. Exam Location:  Inpatient Procedure: Limited Echo and Color Doppler Indications:    assess LVF  History:        Patient has prior history of Echocardiogram examinations, most                 recent 03/23/2020. CHF; Risk Factors:Diabetes and Hypertension.  Sonographer:    Melissa Morford RDCS (AE, PE) Referring Phys: [id number] HEATHER E [place]  Sonographer Comments: Technically challenging study due to limited acoustic windows. Image acquisition challenging due to patient body habitus. technically limited imaging-pt supine due to left lateral drains. IMPRESSIONS  1. Left ventricular ejection fraction, by estimation, is 55 to 60%. The left ventricle has normal function. There is mild concentric left ventricular hypertrophy.  2. Right ventricular systolic function is normal. The right ventricular size is normal. Tricuspid regurgitation signal is inadequate for assessing PA pressure.  3. The mitral valve is normal in structure. Trivial mitral valve regurgitation. Comparison(s):  No significant change from prior study. FINDINGS  Left Ventricle: Left ventricular ejection fraction, by estimation, is 55 to 60%. The left ventricle has normal function. The left ventricular internal cavity size was normal in size. There is mild concentric left ventricular hypertrophy. Right Ventricle: The right ventricular size  is normal. No increase in right ventricular wall thickness. Right ventricular systolic function is normal. Tricuspid regurgitation signal is inadequate for assessing PA pressure. Right Atrium: Right atrial size was normal in size. Mitral Valve: The mitral valve is normal in structure. Trivial mitral valve regurgitation. Tricuspid Valve: The tricuspid valve is normal in structure. Tricuspid valve regurgitation is trivial. LEFT VENTRICLE PLAX 2D LVIDd:         4.95 cm LVIDs:         4.10 cm LV PW:         1.20 cm LV IVS:        1.20 cm  LV Volumes (MOD) LV vol d, MOD A2C: 102.0 ml LV vol d, MOD A4C: 88.0 ml LV vol s, MOD A2C: 59.8 ml LV vol s, MOD A4C: 57.5 ml LV SV MOD A2C:     42.2 ml LV SV MOD A4C:     88.0 ml LV SV MOD BP:      40.4 ml LEFT ATRIUM         Index LA diam:    3.30 cm 1.51 cm/m [name] MD Electronically signed by [name] MD Signature Date/Time: 05/12/2022/12:40:56 PM    Final     Cardiac Studies     Patient Profile     52 y.o. female with history of congestive heart failure, morbid obesity, obstructive sleep apnea  Assessment & Plan    1.  History of congestive heart failure: The patient is well compensated at present.  Echocardiogram reveals normal left ventricular systolic function.  Right ventricular size and function were noted to be normal.  I would place her at mild to moderate risk for cardiovascular complications for any abdominal surgeries needed.  She is not having any angina.  There is no indication for Myoview at this time.    [facility] will sign off.   Medication Recommendations:    Other recommendations (labs, testing, etc):    Follow up as an outpatient:  with Dr. Antoine Poche   For questions or updates, please contact [facility] Please consult [email or web address] for contact info under        Signed, Kristeen Miss, MD  05/14/2022, 8:59 AM

## 2022-05-15 ENCOUNTER — Other Ambulatory Visit (HOSPITAL_COMMUNITY): Payer: Self-pay

## 2022-05-15 ENCOUNTER — Ambulatory Visit (INDEPENDENT_AMBULATORY_CARE_PROVIDER_SITE_OTHER): Payer: Medicare PPO | Admitting: Psychology

## 2022-05-15 ENCOUNTER — Other Ambulatory Visit: Payer: Self-pay | Admitting: Radiology

## 2022-05-15 DIAGNOSIS — K651 Peritoneal abscess: Secondary | ICD-10-CM | POA: Diagnosis not present

## 2022-05-15 DIAGNOSIS — I5042 Chronic combined systolic (congestive) and diastolic (congestive) heart failure: Secondary | ICD-10-CM | POA: Diagnosis not present

## 2022-05-15 DIAGNOSIS — G4733 Obstructive sleep apnea (adult) (pediatric): Secondary | ICD-10-CM | POA: Diagnosis not present

## 2022-05-15 DIAGNOSIS — F319 Bipolar disorder, unspecified: Secondary | ICD-10-CM

## 2022-05-15 LAB — GLUCOSE, CAPILLARY
Glucose-Capillary: 108 mg/dL — ABNORMAL HIGH (ref 70–99)
Glucose-Capillary: 180 mg/dL — ABNORMAL HIGH (ref 70–99)

## 2022-05-15 LAB — AEROBIC/ANAEROBIC CULTURE W GRAM STAIN (SURGICAL/DEEP WOUND)

## 2022-05-15 LAB — BASIC METABOLIC PANEL
Anion gap: 10 (ref 5–15)
BUN: 5 mg/dL — ABNORMAL LOW (ref 6–20)
CO2: 30 mmol/L (ref 22–32)
Calcium: 9.1 mg/dL (ref 8.9–10.3)
Chloride: 96 mmol/L — ABNORMAL LOW (ref 98–111)
Creatinine, Ser: 0.84 mg/dL (ref 0.44–1.00)
GFR, Estimated: >60 mL/min (ref >60)
Glucose, Bld: 137 mg/dL — ABNORMAL HIGH (ref 70–99)
Potassium: 3.9 mmol/L (ref 3.5–5.1)
Sodium: 136 mmol/L (ref 135–145)

## 2022-05-15 LAB — CBC WITH DIFFERENTIAL/PLATELET
Abs Immature Granulocytes: 0 10*3/uL (ref 0.00–0.07)
Basophils Absolute: 0.1 10*3/uL (ref 0.0–0.1)
Basophils Relative: 2 %
Eosinophils Absolute: 0.3 10*3/uL (ref 0.0–0.5)
Eosinophils Relative: 6 %
HCT: 45.9 % (ref 36.0–46.0)
Hemoglobin: 14.8 g/dL (ref 12.0–15.0)
Lymphocytes Relative: 21 %
Lymphs Abs: 1 10*3/uL (ref 0.7–4.0)
MCH: 28.2 pg (ref 26.0–34.0)
MCHC: 32.2 g/dL (ref 30.0–36.0)
MCV: 87.4 fL (ref 80.0–100.0)
Monocytes Absolute: 0.2 10*3/uL (ref 0.1–1.0)
Monocytes Relative: 5 %
Neutro Abs: 3.2 10*3/uL (ref 1.7–7.7)
Neutrophils Relative %: 66 %
Platelets: 409 10*3/uL — ABNORMAL HIGH (ref 150–400)
RBC: 5.25 MIL/uL — ABNORMAL HIGH (ref 3.87–5.11)
RDW: 15.2 % (ref 11.5–15.5)
WBC: 4.9 10*3/uL (ref 4.0–10.5)
nRBC: 0 % (ref 0.0–0.2)
nRBC: 0 /100 WBC

## 2022-05-15 MED ORDER — FLUTICASONE PROPIONATE 50 MCG/ACT NA SUSP
2.0000 | Freq: Every day | NASAL | 0 refills | Status: AC
  Filled 2022-05-15: qty 16, 7d supply, fill #0

## 2022-05-15 MED ORDER — SULFAMETHOXAZOLE-TRIMETHOPRIM 800-160 MG PO TABS
2.0000 | ORAL_TABLET | Freq: Two times a day (BID) | ORAL | 0 refills | Status: AC
  Filled 2022-05-15: qty 112, 28d supply, fill #0

## 2022-05-15 MED ORDER — METRONIDAZOLE 500 MG PO TABS
500.0000 mg | ORAL_TABLET | Freq: Two times a day (BID) | ORAL | 0 refills | Status: AC
  Filled 2022-05-15: qty 56, 28d supply, fill #0

## 2022-05-15 MED ORDER — OXYCODONE HCL 5 MG PO TABS
5.0000 mg | ORAL_TABLET | ORAL | 0 refills | Status: DC | PRN
  Filled 2022-05-15: qty 15, 3d supply, fill #0

## 2022-05-15 MED ORDER — POLYETHYLENE GLYCOL 3350 17 G PO PACK: 17.0000 g | PACK | Freq: Every day | ORAL | Status: DC | PRN

## 2022-05-15 MED ORDER — ACETAMINOPHEN 325 MG PO TABS: 650.0000 mg | ORAL_TABLET | Freq: Four times a day (QID) | ORAL | Status: AC | PRN

## 2022-05-15 MED ORDER — LORATADINE 10 MG PO TABS
10.0000 mg | ORAL_TABLET | Freq: Every day | ORAL | 0 refills | Status: AC
  Filled 2022-05-15: qty 10, 10d supply, fill #0

## 2022-05-15 NOTE — Progress Notes (Signed)
Mobility Specialist - Progress Note   05/15/22 1017  Mobility  Activity Ambulated independently to bathroom  Level of Assistance Modified independent, requires aide device or extra time  Assistive Device None  Distance Ambulated (ft) 10 ft  Activity Response Tolerated well  $Mobility charge 1 Mobility   Pt was received in bed and requesting assistance to BR. No complaints throughout. Pt was left on commode with all needs met and instructed to use call bell when finished.   [name]  Mobility Specialist Please contact via Special educational needs teacher or Rehab office at [phone]

## 2022-05-15 NOTE — Discharge Instructions (Addendum)
if patient is to be discharged, below are discharge instructions: - Flush each drain once daily with 5-10 cc NS flush (Order for flushes sent to [facility]).  - Record output from each drain once daily. - Follow-up at drain clinic 10-14 days after discharge for CT/possible drain injection (assess for possible drain removal)- order placed to facilitate this.

## 2022-05-15 NOTE — Plan of Care (Signed)
Patient AOX4, VSS throughout shift. All meds given on time as ordered. Pt c/o pain relieved by PRN oxycodone. Diminished lungs, IS encouraged. Pt ambulated with steady gait to bathroom. POC maintained, will continue to monitor.  Problem: Education: Goal: Ability to describe self-care measures that may prevent or decrease complications (Diabetes Survival Skills Education) will improve Outcome: Progressing Goal: Individualized Educational Video(s) Outcome: Progressing   Problem: Coping: Goal: Ability to adjust to condition or change in health will improve Outcome: Progressing   Problem: Fluid Volume: Goal: Ability to maintain a balanced intake and output will improve Outcome: Progressing   Problem: Health Behavior/Discharge Planning: Goal: Ability to identify and utilize available resources and services will improve Outcome: Progressing Goal: Ability to manage health-related needs will improve Outcome: Progressing   Problem: Metabolic: Goal: Ability to maintain appropriate glucose levels will improve Outcome: Progressing   Problem: Nutritional: Goal: Maintenance of adequate nutrition will improve Outcome: Progressing Goal: Progress toward achieving an optimal weight will improve Outcome: Progressing   Problem: Skin Integrity: Goal: Risk for impaired skin integrity will decrease Outcome: Progressing   Problem: Tissue Perfusion: Goal: Adequacy of tissue perfusion will improve Outcome: Progressing   Problem: Education: Goal: Knowledge of General Education information will improve Description: Including pain rating scale, medication(s)/side effects and non-pharmacologic comfort measures Outcome: Progressing   Problem: Health Behavior/Discharge Planning: Goal: Ability to manage health-related needs will improve Outcome: Progressing   Problem: Clinical Measurements: Goal: Ability to maintain clinical measurements within normal limits will improve Outcome: Progressing Goal:  Will remain free from infection Outcome: Progressing Goal: Diagnostic test results will improve Outcome: Progressing Goal: Respiratory complications will improve Outcome: Progressing Goal: Cardiovascular complication will be avoided Outcome: Progressing   Problem: Activity: Goal: Risk for activity intolerance will decrease Outcome: Progressing   Problem: Nutrition: Goal: Adequate nutrition will be maintained Outcome: Progressing   Problem: Coping: Goal: Level of anxiety will decrease Outcome: Progressing   Problem: Elimination: Goal: Will not experience complications related to bowel motility Outcome: Progressing Goal: Will not experience complications related to urinary retention Outcome: Progressing   Problem: Pain Managment: Goal: General experience of comfort will improve Outcome: Progressing   Problem: Safety: Goal: Ability to remain free from injury will improve Outcome: Progressing   Problem: Skin Integrity: Goal: Risk for impaired skin integrity will decrease Outcome: Progressing

## 2022-05-15 NOTE — TOC Transition Note (Signed)
Transition of Care [facility]) - CM/SW Discharge Note   Patient Details  Name: [name] MRN: [id number] Date of Birth: Dec 26, 1970  Transition of [facility] CM/SW Contact:  Leone Haven, RN Phone Number: 05/15/2022, 4:01 PM   Clinical Narrative:     Patient is for dc today, has no needs.        Patient Goals and CMS Choice      Discharge Placement                         Discharge Plan and Services Additional resources added to the After Visit Summary for                                       Social Determinants of Health (SDOH) Interventions SDOH Screenings   Food Insecurity: No Food Insecurity (05/10/2022)  Housing: Low Risk  (05/10/2022)  Transportation Needs: Unmet Transportation Needs (05/10/2022)  Utilities: Not At Risk (05/10/2022)  Alcohol Screen: Low Risk  (04/02/2022)  Depression (PHQ2-9): Low Risk  (04/02/2022)  Financial Resource Strain: Low Risk  (04/02/2022)  Physical Activity: Insufficiently Active (04/02/2022)  Social Connections: Moderately Integrated (04/02/2022)  Stress: No Stress Concern Present (04/02/2022)  Tobacco Use: Low Risk  (05/11/2022)     Readmission Risk Interventions     No data to display

## 2022-05-15 NOTE — Progress Notes (Signed)
[name] is a 52 y.o. female patient   Treatment Plan: Date: 05/15/2022  Diagnosis 296.80 (Unspecified bipolar or related disorder) [n/a]  Symptoms Depressed or irritable mood. (Status: maintained) -- No Description Entered  Diminished interest in or enjoyment of activities. (Status: maintained) -- No Description Entered  Lack of energy. (Status: maintained) -- No Description Entered  Low self-esteem. (Status: maintained) -- No Description Entered  Medication Status compliance  Safety none  If Suicidal or Homicidal State Action Taken: unspecified  Current Risk: low Medications Adderall (Dosage: 20mg  4X/day)  Klonapin (Dosage: 1mg  3X/day)  Lamictal (Dosage: 200mg  2x/day)  Nuedexta (Dosage: 20mg )  Vyralar (Dosage: 6mg )  Xanax (Dosage: 1mg  3X/day)  Objectives Related Problem: Develop healthy cognitive patterns and beliefs about self and the world that lead to alleviation and help prevent the relapse of mood episodes. Description: State no longer having thoughts of self-harm. Target Date: 2023-01-08 Frequency: Daily Modality: individual Progress: 80%  Related Problem: Develop healthy cognitive patterns and beliefs about self and the world that lead to alleviation and help prevent the relapse of mood episodes. Description: Verbalize any history of past and present suicidal thoughts and actions. Target Date: 2023-01-08 Frequency: Daily Modality: individual Progress: 80%  Related Problem: Develop healthy cognitive patterns and beliefs about self and the world that lead to alleviation and help prevent the relapse of mood episodes. Description: Take prescribed medications as directed. Target Date: 2023-01-08 Frequency: Daily Modality: individual Progress: 90%  Related Problem: Develop healthy cognitive patterns and beliefs about self and the world that lead to alleviation and help prevent the relapse of  mood episodes. Description: Discuss and resolve troubling personal and interpersonal issues. Target Date: 2023-01-08 Frequency: Daily Modality: individual Progress: 80% Resolve complicated grief       Target Date: 12-24       Progress: 20%  Client Response full compliance  Service Location Location, [address] [place], [place] [address] -virtual Service Code cpt [phone]  Emotion regulation skills  Related past to present  Rationally challenge thoughts or beliefs/cognitive restructuring  Identify/label emotions  Validate/empathize  Self care activities  Lifestyle change (exercise, nutrition)  Self-monitoring  Facilitate problem solving  Session notes:  f31.9 Bipolar Unspecified.  Goals: Mood stabilization, improved marital relationship, improve self esteem and elimination of self harm thoughts/tendencies. Also, patient struggles with leaving home comfortably and seeks to improve this ability. Target date for these goals is January, 2023. In addition, she needs to improve self care behaviors due to her weight and various medical conditions. Target date for this goal is Feb., 2022. Date revised to December, 2023 to allow time for behavioral implementation. Reports periodic outbursts of rage/anger. Seeks to minimize. Target date is December, 2023. Patient has realized improvement in all areas. Requests additional treatment to facilitate maintenance of progress. Goal date 12-24. Patient's husband died unexpectedly and she is suffering complicated grief. She needs additional counseling to address associated issues and manage feelings of loss, despair and anxiety. Goal date 12-24.  Meds: Vyralar (6mg ),Nuedexta (40+20mg /day), Adderall (20mg  4X/day), Xanax (1mg , 3X/day), and Klonopin (1mg  3X/day).  Patient has requested a video session. She is at home and I am in my home office.  [name] states she has been in the hospital since Thursday due to an infection at the site of her drain. She  will  likely get out today or tomorrow. Once infection is completely healed, she will have to have surgery to take out part of colon and then get a colostomy. Over time the colostomy will be reversed. She has little to no detail about the surgery. Has been feeling fatigued while at the hospital. In spite of her situation, she is relatively positive and does not report any significant decline in mood.                                                                                                        [name]                                                                                                                                                                                        [name], PhD  Time:2:15p-3:00p 45 minutes.                           n     n

## 2022-05-15 NOTE — Progress Notes (Signed)
   Subjective/Chief Complaint: Abdominal pain remains improved. Tolerating clears and had BM yesterday  Objective: Vital signs in last 24 hours: Temp:  [97.5 F (36.4 C)-98 F (36.7 C)] 98 F (36.7 C) (04/16 0533) Pulse Rate:  [59-64] 63 (04/16 0533) Resp:  [15-16] 15 (04/15 1706) BP: (96-135)/(53-84) 106/57 (04/16 0533) SpO2:  [94 %-96 %] 94 % (04/16 0533) FiO2 (%):  [21 %] 21 % (04/15 2316) Last BM Date : 05/10/22  Intake/Output from previous day: 04/15 0701 - 04/16 0700 In: 741.5 [P.O.:617; IV Piggyback:124.5] Out: 1015 [Urine:1000; Drains:15] Intake/Output this shift: No intake/output data recorded.  WDWN in NAD Abd - soft, mild TTP around drain with SS output. Old drain site with scant dried yellow bloody drainage on bandage and no active drainage at site. Site without erythema or induration  Lab Results:  Recent Labs    05/14/22 0350 05/15/22 0455  WBC 4.6 4.9  HGB 14.3 14.8  HCT 44.2 45.9  PLT 442* 409*    BMET Recent Labs    05/14/22 0350 05/15/22 0455  NA 138 136  K 3.9 3.9  CL 96* 96*  CO2 33* 30  GLUCOSE 137* 137*  BUN 5* 5*  CREATININE 0.91 0.84  CALCIUM 9.1 9.1    PT/INR No results for input(s): "LABPROT", "INR" in the last 72 hours.  ABG No results for input(s): "PHART", "HCO3" in the last 72 hours.  Invalid input(s): "PCO2", "PO2"  Studies/Results: No results found.  Anti-infectives: Anti-infectives (From admission, onward)    Start     Dose/Rate Route Frequency Ordered Stop   05/10/22 2100  piperacillin-tazobactam (ZOSYN) IVPB 3.375 g        3.375 g 12.5 mL/hr over 240 Minutes Intravenous Every 8 hours 05/10/22 2013         Assessment/Plan: H/o laparoscopic peritoneal lavage with drain placement on 12/11/2021 Dr. Dossie Der for diverticulitis  H/o multiple prior IR drain placements for IAA. 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  - s/p IR drain 4/12, 15 ml/24H documented - SS. Cx with E. coli - cards has seen for preop clearance - no further testing indicated. Cont lasix - she is doing well clinically and drain has remained SS. At this point may be able to discharge with drain with close surgical follow up for ongoing planning for likely hartman's procedure once current infection/inflammation improved - ADAT   FEN: FLD ADAT CM, ensure, colace ID: [email or web address] VTE: heparin subq   Chronic combined systolic and diastolic CHF (congestive heart failure)  Essential hypertension, benign OSA on CPAP Type 2 diabetes mellitus with other specified complication Bipolar disorder Morbid obesity   LOS: 5 days    Eric Form, [facility] 05/15/2022, 8:03 AM Please see Amion for pager number during day hours 7:00am-4:30pm

## 2022-05-15 NOTE — Discharge Summary (Signed)
Physician Discharge Summary  [name] ZOX:[id number] DOB: 08/11/1970 DOA: 05/10/2022  PCP: [place], Betty G, MD  Admit date: 05/10/2022 Discharge date: 05/15/2022  Time spent: 60 minutes  Recommendations for Outpatient Follow-up:  Follow-up with Dr.Stechschulte, general surgery 06/13/2022 Follow-up with diagnostic radiology and imaging, IR scheduler will call with appointment date and time for follow-up on drain. Follow-up with [place], Betty G, MD in 7 to 10 days.  On follow-up patient will need a basic metabolic profile done to follow-up on electrolytes and renal function.  Patient need a CBC done to follow-up on counts.   Discharge Diagnoses:  Principal Problem:   Intra-abdominal abscess Active Problems:   Morbid obesity   Bipolar disorder   Type 2 diabetes mellitus with other specified complication   OSA on CPAP   Essential hypertension, benign   Chronic combined systolic and diastolic CHF (congestive heart failure)   Discharge Condition: Stable and improved  Diet recommendation: Heart healthy  Filed Weights   05/12/22 0600 05/12/22 2103 05/13/22 0928  Weight: 114.7 kg 111.6 kg 111.8 kg    History of present illness:  HPI per Dr. Imogene Burn 52 year old female history of morbid obesity BMI 44.7, type 2 diabetes diet-controlled, hypertension, OSA on CPAP, bipolar disorder, history of perforated diverticulitis status post multiple drains in November 2023 presents to the ER today with increasing abdominal pain and drainage for the last 3 to 5 days.  Patient states that she had her last intra-abdominal drain pulled at the end of March under the direction of general surgery.  She states that she has been doing well since then.  Over the last 3 to 5 days, she has noted increasing drainage from her prior drainage puncture site.  She has had some chills but no documented fever.  She made a call to her general surgeon for follow-up appointment but states that her abdominal pain grew  worse today and presented to the ER.   On arrival temp 97.9 heart rate 82 blood pressure 106/70 satting 97% on room air.   Labs showed a white count of 9.8, hemoglobin 15, platelets of 381   Sodium 136, potassium 3.6, bicarb of 23, BUN of 15, creatinine 0.77, serum glucose of 208   Lipase of 27   CT abdomen pelvis showed midline lower abdominal gas containing fluid collection measuring 7.5 x 4.3.  This was previously 2.2 x 2.2.  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  There is also another 6.8 x 3.2 fluid collection that communicates posteriorly and extends into the rectouterine pouch.  There is also a gas containing fluid collection in the abdominal musculature measuring 2.8 x 2.0 cm.   EDP discussed the case with Dr. Derrell Lolling with general surgery.  Consult was requested for general surgery see the patient.   [facility] hospitalist consulted for admission.    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 -CT abdomen and pelvis done concerning for abscesses and fistulous formation. -Admitting physician discussed with patient that patient may need surgery at this time for sigmoid colectomy however defer to general surgeon. -Patient seen in consultation by general surgery, who recommended IR evaluation for drain placement which was done  05/11/2022 with cultures growing moderate E. coli, and recommending that patient will likely eventually need colectomy.   -Drain output noted during the hospitalization.  -Patient also seen in consultation by cardiology for cardiac clearance. -Per cardiology  patient mild to moderate risk for cardiovascular complications for any abdominal surgeries needed, no indication for Myoview at this time per cardiology. -Patient was on clear liquids and diet advanced to a regular diet.   -Patient maintained on IV Zosyn.   -Cultures obtained from drain grew moderate E. coli, moderate bacteriodes thetaiotaomicron, which was resistant to ampicillin intermediate to Unasyn but sensitive to the cephalosporins, ciprofloxacin, gentamicin, imipenem, Bactrim, Zosyn.   -Patient cleared by general surgery for discharge with close outpatient follow-up.   -Patient will discharge with drain in place, and will be discharged on Bactrim DS 2 tabs twice daily x 4 weeks, Flagyl 500 mg twice daily x 4 weeks.  Antibiotic regimen discussed with ID PA and ID pharmacist.   -Outpatient follow-up with IR and general surgery.     2.  Chronic combined systolic and diastolic CHF, POA -Patient remained euvolemic throughout the hospitalization. -Patient diuretics of Lasix was held initially on presentation, as patient was made n.p.o. and patient placed on gentle hydration.   -Patient resumed back on Lasix 40 mg twice daily.  Patient noted to be on Lasix 80 mg in the morning and 40 mg in the evenings prior to admission.   -Cozaar resumed. -Seen and followed by cardiology during the hospitalization. -Patient will be discharged home on home regimen of diuretics and Cozaar.   3.  Hypertension -BP noted to be soft early on in the hospitalization, and Lasix and Cozaar initially held.   -Patient maintained on clear liquids during the hospitalization.   -Blood pressure improved and patient resumed back on Lasix and Cozaar.   -Outpatient follow-up.     4.  Type 2 diabetes mellitus, diet controlled -Hemoglobin A1c noted at 5.9 (11/14/2021). -Hemoglobin A1c 7.3 (05/12/2022). -Patient maintained on SSI.   5.  OSA -CPAP nightly.    6.  Morbid obesity -Lifestyle modification. -Outpatient  follow-up PCP.   7.  Bipolar disorder -Patient maintained on home regimen psych medications.   8.  Hyperlipidemia -Patient maintained on statin.   9.  Hypokalemia -Secondary to diuresis.   -Repleted. -Patient placed back on home regimen of oral daily potassium supplementation.   -Outpatient follow-up with PCP.    10.  Congestion -Patient with nasal congestion today. -Place on Claritin, Flonase.    Procedures: CT abdomen and pelvis 05/10/2022 CT-guided placement of lower abdominal abscess drain per IR: Dr. Lowella Dandy 05/11/2022 2D echo 05/12/2022    Consultations: General surgery: Dr. Dwain Sarna 05/11/2022 Interventional radiology: Dr. Lowella Dandy 05/11/2022 Cardiology: Dr. Shari Prows 05/11/2022 Wound care RN: Ria Bush, RN 05/11/2022  Discharge Exam: Vitals:   05/15/22 0533 05/15/22 0832  BP: (!) 106/57 122/77  Pulse: 63 (!) 53  Resp:  18  Temp: 98 F (36.7 C) 97.6 F (36.4 C)  SpO2: 94% 99%    General: NAD Cardiovascular: Regular rate rhythm no murmurs rubs or gallops.  No JVD.  No lower extremity edema. Respiratory: Clear to auscultation bilaterally.  No wheezes, no crackles, no rhonchi.  Fair air movement.  Speaking in full sentences.  Discharge Instructions   Discharge Instructions     Diet - low sodium heart healthy   Complete by: As directed    Discharge wound care:   Complete by: As directed    Wound care  Daily  Comments: Wound care to left lower abdomen drain site:  Cleanse with soap and water, rinse with NS and pat dry. Cover with size appropriate piece of silver hydrofiber (Aquacel Ag+ Advantage, Lawson # [id number]) top with dry gauze and secure with paper tape. Chagne daily and PRn drainage strike through onto exterior of dressing.   Increase activity slowly   Complete by: As directed       Allergies as of 05/15/2022       Reactions   Savella [milnacipran Hcl] Other (See Comments)   mania        Medication List     STOP taking these medications     [name] 1.5/30 1.5-30 MG-MCG tablet Generic drug: Norethindrone Acetate-Ethinyl Estradiol       TAKE these medications    Accu-Chek Aviva Plus w/Device Kit As directed.   acetaminophen 325 MG tablet Commonly known as: TYLENOL Take 2 tablets (650 mg total) by mouth every 6 (six) hours as needed for mild pain (or Fever >/= 101).   albuterol 108 (90 Base) MCG/ACT inhaler Commonly known as: VENTOLIN HFA Inhale 2 puffs into the lungs every 6 (six) hours as needed for wheezing or shortness of breath.   ALPRAZolam 1 MG tablet Commonly known as: XANAX Take 1 mg by mouth 2 (two) times daily.   ALPRAZolam 1 MG 24 hr tablet Commonly known as: XANAX XR Take 1 mg by mouth in the morning and at bedtime. Taking twice daily along with 1mg  tablet   amphetamine-dextroamphetamine 20 MG tablet Commonly known as: ADDERALL Take 20 mg by mouth in the morning, at noon, in the evening, and at bedtime.   ascorbic acid 500 MG tablet Commonly known as: VITAMIN C Take 1,000 mg by mouth daily.   aspirin 81 MG tablet Take 1 tablet (81 mg total) by mouth daily.   atorvastatin 20 MG tablet Commonly known as: LIPITOR TAKE 1 TABLET BY MOUTH EVERY DAY IN THE EVENING What changed:  how much to take how to take this when to take this   b complex vitamins tablet Take 1 tablet by mouth daily.   Chromium Picolinate 500 MCG Tabs Take 500 mcg by mouth daily.   CITRACAL PO Take 2 tablets by mouth daily.   diclofenac Sodium 1 % Gel Commonly known as: VOLTAREN Apply 2 g topically 3 (three) times daily as needed (knee pain).   Fish Oil 1200 MG Caps Take 2,400 mg by mouth daily.   fluticasone 50 MCG/ACT nasal spray Commonly known as: FLONASE Place 2 sprays into both nostrils daily for 7 days. Start taking on: May 16, 2022   furosemide 40 MG tablet Commonly known as: LASIX Take 2 tablets (80 mg total) by mouth 2 (two) times daily. What changed:  how much to take when to take this    levalbuterol 45 MCG/ACT inhaler Commonly known as: XOPENEX HFA Inhale 2 puffs into the lungs every 6 (six) hours as needed for wheezing.   lidocaine 5 % Commonly known as: LIDODERM PLACE 1 PATCH ONTO THE SKIN DAILY AS NEEDED FOR KNEE PAIN. REMOVE AND DISCARD PATCH WITHIN 12 HOURS OR AS DIRECTED BY DOCTOR. What changed: See the new instructions.   loratadine 10 MG tablet Commonly known as: CLARITIN Take 1 tablet (10 mg total) by mouth daily for 10 days. Start taking on: May 16, 2022   losartan 25 MG tablet Commonly known as: COZAAR Take 1 tablet (25 mg total) by mouth 2 (two) times daily.   Lyrica 300  MG capsule Generic drug: pregabalin Take 300 mg by mouth 2 (two) times daily.   MAGNESIUM PO Take 500 mg by mouth daily.   metroNIDAZOLE 500 MG tablet Commonly known as: Flagyl Take 1 tablet (500 mg total) by mouth 2 (two) times daily for 28 days.   multivitamin tablet Take 1 tablet by mouth daily.   Normal Saline Flush 0.9 % Soln Use to flush drain 1-2 times daily.   Nuedexta 20-10 MG capsule Generic drug: Dextromethorphan-quiNIDine Take 1 capsule by mouth every 12 (twelve) hours.   ondansetron 4 MG disintegrating tablet Commonly known as: ZOFRAN-ODT Take 4 mg by mouth every 8 (eight) hours as needed for nausea.   oxyCODONE 5 MG immediate release tablet Commonly known as: Oxy IR/ROXICODONE Take 1 tablet (5 mg total) by mouth every 4 (four) hours as needed for moderate pain.   potassium chloride SA 20 MEQ tablet Commonly known as: Klor-Con M20 Take 1 tablet (20 mEq total) by mouth daily.   sulfamethoxazole-trimethoprim 800-160 MG tablet Commonly known as: BACTRIM DS Take 2 tablets by mouth 2 (two) times daily for 28 days.   Vitamin D3 125 MCG (5000 UT) Tabs Take 10,000 Units by mouth in the morning and at bedtime.   Vraylar 6 MG Caps Generic drug: Cariprazine HCl Take 6 mg by mouth every evening.   Zinc 50 MG Caps Take 50 mg by mouth in the morning and  at bedtime.               Discharge Care Instructions  (From admission, onward)           Start     Ordered   05/15/22 0000  Discharge wound care:       Comments: Wound care  Daily      Comments: Wound care to left lower abdomen drain site:  Cleanse with soap and water, rinse with NS and pat dry. Cover with size appropriate piece of silver hydrofiber (Aquacel Ag+ Advantage, Lawson # [id number]) top with dry gauze and secure with paper tape. Chagne daily and PRn drainage strike through onto exterior of dressing.   05/15/22 1547           Allergies  Allergen Reactions   Savella [Milnacipran Hcl] Other (See Comments)    mania    Follow-up Information     Stechschulte, Hyman Hopes, MD. Go on 06/13/2022.   Specialty: Surgery Why: 06/13/2022 at 1:50pm. Please arrive 15 minutes early to complete check in, and bring photo ID and insurance card. Contact information: [address] [place] [address] [phone]         [place], Betty G, MD. Schedule an appointment as soon as possible for a visit in 1 week(s).   Specialty: Family Medicine Why: f/u in 7 - 10 days Contact information: [address] [place] Flake [place] [address] [phone]         [facility] Follow up.   Why: IR scheduler will call with appoinment date and time. Please call [phone] with any questions or concerns Contact information: [address] [place] [address] [phone]                  The results of significant diagnostics from this hospitalization (including imaging, microbiology, ancillary and laboratory) are listed below for reference.    Significant Diagnostic Studies: ECHOCARDIOGRAM LIMITED  Result Date: 05/12/2022    ECHOCARDIOGRAM LIMITED REPORT   Patient Name:   CHARLES NIESE Date  of Exam: 05/12/2022 Medical Rec #:  [id number]           Height:       65.0 in Accession #:    [id number]          Weight:       252.9  lb Date of Birth:  1970/10/07          BSA:          2.185 m Patient Age:    51 years            BP:           137/71 mmHg Patient Gender: F                   HR:           56 bpm. Exam Location:  Inpatient Procedure: Limited Echo and Color Doppler Indications:    assess LVF  History:        Patient has prior history of Echocardiogram examinations, most                 recent 03/23/2020. CHF; Risk Factors:Diabetes and Hypertension.  Sonographer:    Melissa Morford RDCS (AE, PE) Referring Phys: [id number] HEATHER E PEMBERTON  Sonographer Comments: Technically challenging study due to limited acoustic windows. Image acquisition challenging due to patient body habitus. technically limited imaging-pt supine due to left lateral drains. IMPRESSIONS  1. Left ventricular ejection fraction, by estimation, is 55 to 60%. The left ventricle has normal function. There is mild concentric left ventricular hypertrophy.  2. Right ventricular systolic function is normal. The right ventricular size is normal. Tricuspid regurgitation signal is inadequate for assessing PA pressure.  3. The mitral valve is normal in structure. Trivial mitral valve regurgitation. Comparison(s): No significant change from prior study. FINDINGS  Left Ventricle: Left ventricular ejection fraction, by estimation, is 55 to 60%. The left ventricle has normal function. The left ventricular internal cavity size was normal in size. There is mild concentric left ventricular hypertrophy. Right Ventricle: The right ventricular size is normal. No increase in right ventricular wall thickness. Right ventricular systolic function is normal. Tricuspid regurgitation signal is inadequate for assessing PA pressure. Right Atrium: Right atrial size was normal in size. Mitral Valve: The mitral valve is normal in structure. Trivial mitral valve regurgitation. Tricuspid Valve: The tricuspid valve is normal in structure. Tricuspid valve regurgitation is trivial. LEFT VENTRICLE PLAX  2D LVIDd:         4.95 cm LVIDs:         4.10 cm LV PW:         1.20 cm LV IVS:        1.20 cm  LV Volumes (MOD) LV vol d, MOD A2C: 102.0 ml LV vol d, MOD A4C: 88.0 ml LV vol s, MOD A2C: 59.8 ml LV vol s, MOD A4C: 57.5 ml LV SV MOD A2C:     42.2 ml LV SV MOD A4C:     88.0 ml LV SV MOD BP:      40.4 ml LEFT ATRIUM         Index LA diam:    3.30 cm 1.51 cm/m [name] MD Electronically signed by [name] MD Signature Date/Time: 05/12/2022/12:40:56 PM    Final    DG CHEST PORT 1 VIEW  Result Date: 05/11/2022 CLINICAL DATA:  10028 Wheezing [id number] EXAM: PORTABLE CHEST 1 VIEW COMPARISON:  Chest x-ray 12/28/2021 FINDINGS: The heart and mediastinal contours are  within normal limits. Right costophrenic angle airspace opacity. Increased interstitial markings. No pleural effusion. No pneumothorax. No acute osseous abnormality. IMPRESSION: 1. Right costophrenic angle airspace opacity. Followup PA and lateral chest X-ray is recommended in 3-4 weeks following therapy to ensure resolution and exclude underlying malignancy. 2. Increased interstitial markings suggestive of bronchitic changes. Electronically Signed   By: Tish Frederickson M.D.   On: 05/11/2022 18:47   CT GUIDED PERITONEAL/RETROPERITONEAL FLUID DRAIN BY PERC CATH  Result Date: 05/11/2022 INDICATION: 52 year old with a recurrent abdominal abscess. History of abdominal abscesses and percutaneous drains. Drainage from the old percutaneous drain site. EXAM: CT-GUIDED PLACEMENT OF DRAIN INTO LOWER ABDOMINAL ABSCESS TECHNIQUE: Multidetector CT imaging of the abdomen and pelvis was performed following the standard protocol without IV contrast. RADIATION DOSE REDUCTION: This exam was performed according to the departmental dose-optimization program which includes automated exposure control, adjustment of the mA and/or kV according to patient size and/or use of iterative reconstruction technique. MEDICATIONS: The patient is currently admitted to the  hospital and receiving intravenous antibiotics. The antibiotics were administered within an appropriate time frame prior to the initiation of the procedure. ANESTHESIA/SEDATION: Moderate (conscious) sedation was employed during this procedure. A total of Versed 3.5 mg and Fentanyl 200 mcg was administered intravenously by the radiology nurse. Total intra-service moderate Sedation Time: 35 minutes. The patient's level of consciousness and vital signs were monitored continuously by radiology nursing throughout the procedure under my direct supervision. COMPLICATIONS: None immediate. PROCEDURE: Informed written consent was obtained from the patient after a thorough discussion of the procedural risks, benefits and alternatives. All questions were addressed. Maximal Sterile Barrier Technique was utilized including caps, mask, sterile gowns, sterile gloves, sterile drape, hand hygiene and skin antiseptic. A timeout was performed prior to the initiation of the procedure. Patient was placed supine on CT scanner. Images through the abdomen and pelvis were obtained. The air-fluid collection near the midline of the lower abdomen was identified and targeted for drainage. The patient also has drainage from the old drain site in the left lower abdomen. The left lower abdomen was prepped with chlorhexidine and sterile field was created. Skin was anesthetized using 1% lidocaine. Small incision was made. Using CT guidance, an 18 gauge trocar needle was directed into the gas-filled collection in the lower abdomen. Yellow purulent fluid was aspirated. Superstiff Amplatz wire was placed. Tract was dilated to accommodate a 12 [place] multipurpose drain. Total of 80 mL of yellow/green fluid was aspirated. Drain was sutured to skin and attached to a suction bulb. Dressing was placed. FINDINGS: 39 French drain was placed within the lower abdominal abscess collection. 80 mL of fluid was removed. Final CT images demonstrate a gas-filled  fistula connection between the abscess collection and the adjacent sigmoid colon. Ultrasound was used to evaluate the left lateral abdominal wall near the old drain site to look for a drainable fluid collection. There is no drainable collection in this area at this time. IMPRESSION: 1. CT-guided placement a drainage catheter within the lower abdominal abscess. Purulent fluid was removed. 2. 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 3. Bloody drainage from the old drain site in the left lower abdomen. The subcutaneous and abdominal wall area was evaluated with ultrasound. There is not a discrete fluid collection in this area that is drainable. Electronically Signed   By: Richarda Overlie M.D.   On: 05/11/2022 17:39   CT ABDOMEN PELVIS W CONTRAST  Result Date: 05/10/2022 CLINICAL DATA:  History  of diverticulitis complicated by postoperative fluid, status post drainage placement. Now presenting with inadvertent drain removal. EXAM: CT ABDOMEN AND PELVIS WITH CONTRAST TECHNIQUE: Multidetector CT imaging of the abdomen and pelvis was performed using the standard protocol following bolus administration of intravenous contrast. RADIATION DOSE REDUCTION: This exam was performed according to the departmental dose-optimization program which includes automated exposure control, adjustment of the mA and/or kV according to patient size and/or use of iterative reconstruction technique. CONTRAST:  75mL OMNIPAQUE IOHEXOL 350 MG/ML SOLN COMPARISON:  CT abdomen and pelvis dated 01/01/2022 FINDINGS: Lower chest: Scattered left lower lobe cysts, unchanged. No pleural effusion or pneumothorax demonstrated. Partially imaged heart size is normal. Coronary artery calcifications. Hepatobiliary: No focal hepatic lesions. No intra or extrahepatic biliary ductal dilation. Cholelithiasis. Pancreas: No focal lesions or main ductal dilation. Spleen: Normal in size without focal abnormality. Adrenals/Urinary  Tract: No adrenal nodules. No suspicious renal mass, calculi or hydronephrosis. No focal bladder wall thickening. Stomach/Bowel: Normal appearance of the stomach. Mural thickening of the sigmoid colon with fistulization to midline abdominal collections as below. Normal appendix. Vascular/Lymphatic: Aortic atherosclerosis. No enlarged abdominal or pelvic lymph nodes. Reproductive: No adnexal masses. Other: Previously noted drainage catheters have been removed, including left lower quadrant approach drain with increased size of gas-containing midline lower abdomen irregular collection measuring 7.5 x 4.3 cm (3:73), previously 2.2 x 2.2 cm, which demonstrates fistulous connection with the sigmoid colon. An irregular collection in communication with this collection posteriorly extends into the rectouterine pouch, measuring 6.8 x 3.2 cm (3:72), previously 3.5 x 3.5 cm. Musculoskeletal: No acute or abnormal lytic or blastic osseous lesions. Multilevel degenerative changes of the partially imaged thoracic and lumbar spine. Subcutaneous stranding and emphysema along the left anterior lower abdominal wall, where a drainage catheter was seen previously. Interval development of a gas-containing fluid collection along this tract within the left abdominal musculature measuring 2.8 x 2.0 cm (3:67). IMPRESSION: 1. 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 An irregular collection in communication posteriorly extends into the rectouterine pouch, measuring 6.8 x 3.2 cm, previously 3.5 x 3.5 cm. 2. Subcutaneous stranding and emphysema along the left anterior lower abdominal wall, where a drainage catheter was seen previously. Interval development of a gas-containing fluid collection along this tract within the left  abdominal musculature measuring 2.8 x 2.0 cm. 3. Aortic Atherosclerosis (ICD10-I70.0). Electronically Signed   By: Agustin Cree M.D.   On: 05/10/2022 18:52   DG Sinus/Fist Tube Chk-Non GI  Result Date: 04/18/2022 CLINICAL DATA:  Drain follow-up. Briefly, 52 year old female with a history of perforated diverticulitis with multiple EXAM: ABSCESS DRAIN INJECTION/ABSCESSOGRAM COMPARISON:  CT AP, 01/01/2022. Multiple IR fluoroscopy examinations, most recently 03/16/2022. CONTRAST:  10 mL Omnipaque 300-administered via the existing percutaneous drain. FLUOROSCOPY TIME:  Fluoroscopic dose; 9.9 mGy TECHNIQUE: The patient was positioned supine on the fluoroscopy table. A preprocedural spot fluoroscopic image was obtained of the LEFT pelvis and the existing percutaneous drainage catheter. Multiple spot fluoroscopic and radiographic images were obtained following the injection of a small amount of contrast via the existing percutaneous drainage catheter. FINDINGS: *Stable positioning of LEFT lower quadrant, anterior-approach pelvic drainage catheter. *Patent catheter, without residual abscess fluid collection. *No discrete fistula on this evaluation. IMPRESSION: No residual abscess fluid collection or discrete fistula on this evaluation. The LEFT lower quadrant drain was therefore  removed Roanna Banning, MD [facility] Radiology Electronically Signed   By: Roanna Banning M.D.   On: 04/18/2022 14:26   IR Radiologist Eval & Mgmt  Result Date: 04/18/2022 EXAM: ESTABLISHED PATIENT OFFICE VISIT CHIEF COMPLAINT: See below HISTORY OF PRESENT ILLNESS: See below REVIEW OF SYSTEMS: See below PHYSICAL EXAMINATION: See below ASSESSMENT AND PLAN: Please refer to completed note in the electronic medical record on [facility] Roanna Banning, MD [facility] Radiology Electronically Signed   By: Roanna Banning M.D.   On: 04/18/2022 13:59     Microbiology: Recent Results (from the past 240 hour(s))  Aerobic/Anaerobic Culture w Gram Stain (surgical/deep wound)     Status: None   Collection Time: 05/11/22  1:30 PM   Specimen: Abscess  Result Value Ref Range Status   Specimen Description ABSCESS  Final   Special Requests LLQ  Final   Gram Stain   Final    NO WBC SEEN FEW GRAM POSITIVE COCCI FEW GRAM NEGATIVE RODS    Culture   Final    MODERATE ESCHERICHIA COLI MODERATE BACTEROIDES THETAIOTAOMICRON BETA LACTAMASE POSITIVE Performed at [facility], [address] [place], [place] [address]    Report Status 05/15/2022 FINAL  Final   Organism ID, Bacteria ESCHERICHIA COLI  Final      Susceptibility   Escherichia coli - MIC*    AMPICILLIN >=32 RESISTANT Resistant     CEFEPIME <=0.12 SENSITIVE Sensitive     CEFTAZIDIME <=1 SENSITIVE Sensitive     CEFTRIAXONE <=0.25 SENSITIVE Sensitive     CIPROFLOXACIN <=0.25 SENSITIVE Sensitive     GENTAMICIN <=1 SENSITIVE Sensitive     IMIPENEM <=0.25 SENSITIVE Sensitive     TRIMETH/SULFA <=20 SENSITIVE Sensitive     AMPICILLIN/SULBACTAM 16 INTERMEDIATE Intermediate     PIP/TAZO <=4 SENSITIVE Sensitive     * MODERATE ESCHERICHIA COLI     Labs: Basic Metabolic Panel: Recent Labs  Lab 05/11/22 0544 05/12/22 0257 05/13/22 0502 05/14/22 0350 05/15/22 0455  NA 139 139 136 138 136  K 3.6 3.7 3.2* 3.9 3.9  CL 103 98 98 96* 96*  CO2 26 28 31  33* 30  GLUCOSE 122* 120* 133* 137* 137*  BUN 15 12 6  5* 5*  CREATININE 0.76 0.81 0.95 0.91 0.84  CALCIUM 9.2 9.4 8.8* 9.1 9.1  MG 1.8 1.7 1.8 2.0  --    Liver Function Tests: Recent Labs  Lab 05/10/22 1448 05/11/22 0544 05/12/22 0257  AST 29 33 19  ALT 27 29 22   ALKPHOS 95 79 74  BILITOT 0.9 0.8 0.9  PROT 6.8 6.2* 6.0*  ALBUMIN 2.7* 2.3* 2.2*   Recent Labs  Lab 05/10/22 1448  LIPASE 27   No results for input(s): "AMMONIA" in the last 168 hours. CBC: Recent Labs  Lab 05/10/22 1448 05/11/22 0544 05/12/22 0257  05/13/22 0502 05/14/22 0350 05/15/22 0455  WBC 9.8 6.5 4.9 4.8 4.6 4.9  NEUTROABS 7.9* 4.0  --  2.7 3.0 3.2  HGB 15.0 13.6 13.2 12.7 14.3 14.8  HCT 45.7 41.5 41.2 40.4 44.2 45.9  MCV 87.7 87.4 89.0 89.0 87.9 87.4  PLT 381 362 371 400 442* 409*   Cardiac Enzymes: No results for input(s): "CKTOTAL", "CKMB", "CKMBINDEX", "TROPONINI" in the last 168 hours. BNP: BNP (last 3 results) No results for input(s): "BNP" in the last 8760 hours.  ProBNP (last 3 results) No results for input(s): "PROBNP" in the last 8760 hours.  CBG: Recent Labs  Lab 05/14/22 1225 05/14/22 1707 05/14/22 2103 05/15/22 0831 05/15/22 1200  GLUCAP 126* 128* 139* 108* 180*       Signed:  Ramiro Harvest MD.  [facility] 05/15/2022, 4:32 PM

## 2022-05-16 ENCOUNTER — Telehealth: Payer: Self-pay

## 2022-05-16 ENCOUNTER — Other Ambulatory Visit: Payer: Self-pay | Admitting: Surgery

## 2022-05-16 ENCOUNTER — Other Ambulatory Visit: Payer: Medicare PPO

## 2022-05-16 DIAGNOSIS — R630 Anorexia: Secondary | ICD-10-CM

## 2022-05-16 DIAGNOSIS — R110 Nausea: Secondary | ICD-10-CM

## 2022-05-16 DIAGNOSIS — R197 Diarrhea, unspecified: Secondary | ICD-10-CM

## 2022-05-16 DIAGNOSIS — R1032 Left lower quadrant pain: Secondary | ICD-10-CM

## 2022-05-16 DIAGNOSIS — R5383 Other fatigue: Secondary | ICD-10-CM

## 2022-05-16 NOTE — Transitions of Care (Post Inpatient/ED Visit) (Signed)
   05/16/2022  Name: [name] MRN: [id number] DOB: 1970-12-21  Today's TOC FU Call Status: Today's TOC FU Call Status:: Successful TOC FU Call Competed TOC FU Call Complete Date: 05/16/22  Transition Care Management Follow-up Telephone Call Date of Discharge: 05/15/22 Discharge Facility: [facility]) Type of Discharge: Inpatient Admission Primary Inpatient Discharge Diagnosis:: "abscess,diverticulitis" How have you been since you were released from the hospital?: Better (Pt states she is doing good-appetite good-denies any N&v,abd pain-"still having a little diarrhea-has gone once today so far-drinking plenty of fluids and staying hydrated") Any questions or concerns?: No  Items Reviewed: Did you receive and understand the discharge instructions provided?: Yes Medications obtained and verified?: Yes (Medications Reviewed) Any new allergies since your discharge?: No Dietary orders reviewed?: Yes Type of Diet Ordered:: low salt/heart healthy Do you have support at home?: No  Home Care and Equipment/Supplies: Were Home Health Services Ordered?: NA Any new equipment or medical supplies ordered?: NA  Functional Questionnaire: Do you need assistance with bathing/showering or dressing?: No Do you need assistance with meal preparation?: No Do you need assistance with eating?: No Do you have difficulty maintaining continence: No Do you need assistance with getting out of bed/getting out of a chair/moving?: No Do you have difficulty managing or taking your medications?: No  Follow up appointments reviewed: PCP Follow-up appointment confirmed?: Yes Date of PCP follow-up appointment?: 05/23/22 Follow-up Provider: Dr. [place] Specialist [facility] Follow-up appointment confirmed?: Yes Date of Specialist follow-up appointment?: 06/13/22 Follow-Up Specialty Provider:: Dr. Opal Sidles Do you need transportation to your follow-up appointment?: No (pt states she has someone that  takes her to appts) Do you understand care options if your condition(s) worsen?: Yes-patient verbalized understanding  SDOH Interventions Today    Flowsheet Row Most Recent Value  SDOH Interventions   Food Insecurity Interventions Intervention Not Indicated  Transportation Interventions Intervention Not Indicated  [pt denies needing assistance at this time-states she is able to get to appts-just has to be in the afternoons when her ride is able to take her]      TOC Interventions Today    Flowsheet Row Most Recent Value  TOC Interventions   TOC Interventions Discussed/Reviewed TOC Interventions Discussed, Arranged PCP follow up within 7 days/Care Guide scheduled, S/S of infection, Post op wound/incision care, Post discharge activity limitations per provider      Interventions Today    Flowsheet Row Most Recent Value  General Interventions   General Interventions Discussed/Reviewed General Interventions Discussed, Doctor Visits  Doctor Visits Discussed/Reviewed Doctor Visits Discussed, PCP, Specialist  PCP/Specialist Visits Compliance with follow-up visit  Education Interventions   Education Provided Provided Education  Provided Verbal Education On Nutrition, When to see the doctor, Medication, Other  [GI sx mgmt-still having some diarrhea but has improved-staying hydrated]  Nutrition Interventions   Nutrition Discussed/Reviewed Nutrition Discussed, Fluid intake, Adding fruits and vegetables, Decreasing salt  Pharmacy Interventions   Pharmacy Dicussed/Reviewed Pharmacy Topics Discussed, Medications and their functions       [facility] Care Management Care Management Community Coordinator Direct Phone: [phone] Toll Free: [phone] Fax: [phone]

## 2022-05-22 ENCOUNTER — Ambulatory Visit (INDEPENDENT_AMBULATORY_CARE_PROVIDER_SITE_OTHER): Payer: Medicare PPO | Admitting: Psychology

## 2022-05-22 DIAGNOSIS — F319 Bipolar disorder, unspecified: Secondary | ICD-10-CM

## 2022-05-22 NOTE — Progress Notes (Unsigned)
HPI: [name] is a 52 y.o. female with past medical history significant for type 2 diabetes diet-controlled, hypertension, OSA on CPAP, bipolar disorder, and history of perforated diverticulitis status post multiple drains in 11/2021 here today to follow on recent hospitalization. She was admitted on 05/10/2022 and discharged on 05/15/2022. TOC call on 05/16/2022. She presented to the ED the date of admission complaining of increasing abdominal pain and drainage. Intra-abdominal abscess/fistulous connection with sigmoid colon: Initially treated with IV Zosyn, discharged on Bactrim DS 2 tablets twice daily. Culture from drain grew E. coli with multiple resistances.  Lab Results  Component Value Date   WBC 4.9 05/15/2022   HGB 14.8 05/15/2022   HCT 45.9 05/15/2022   MCV 87.4 05/15/2022   PLT 409 (H) 05/15/2022  Hypertension: Losartan was held during hospitalization and resumed before hospital discharge. *** Hypokalemia: Secondary to diuresis, it was repleted. She is on K-Lor***.  Lab Results  Component Value Date   CREATININE 0.84 05/15/2022   BUN 5 (L) 05/15/2022   NA 136 05/15/2022   K 3.9 05/15/2022   CL 96 (L) 05/15/2022   CO2 30 05/15/2022   Lab Results  Component Value Date   ALT 22 05/12/2022   AST 19 05/12/2022   ALKPHOS 74 05/12/2022   BILITOT 0.9 05/12/2022   Aginal pruritus and discharge. *** Review of Systems  Constitutional:  Positive for fatigue. Negative for fever.  HENT:  Negative for mouth sores and nosebleeds.   Eyes:  Negative for redness and visual disturbance.  Respiratory:  Negative for cough, shortness of breath and wheezing.   Cardiovascular:  Negative for chest pain, palpitations and leg swelling.  Genitourinary:  Negative for decreased urine volume, dysuria and hematuria.  Neurological:  Negative for syncope, weakness and headaches.  Psychiatric/Behavioral:  Negative for confusion and hallucinations.   See other pertinent positives  and negatives in HPI.  Current Outpatient Medications on File Prior to Visit  Medication Sig Dispense Refill   acetaminophen (TYLENOL) 325 MG tablet Take 2 tablets (650 mg total) by mouth every 6 (six) hours as needed for mild pain (or Fever >/= 101).     albuterol (VENTOLIN HFA) 108 (90 Base) MCG/ACT inhaler Inhale 2 puffs into the lungs every 6 (six) hours as needed for wheezing or shortness of breath. 32 g 0   ALPRAZolam (XANAX XR) 1 MG 24 hr tablet Take 1 mg by mouth in the morning and at bedtime. Taking twice daily along with 1mg  tablet     ALPRAZolam (XANAX) 1 MG tablet Take 1 mg by mouth 2 (two) times daily.     amphetamine-dextroamphetamine (ADDERALL) 20 MG tablet Take 20 mg by mouth in the morning, at noon, in the evening, and at bedtime.      Ascorbic Acid (VITAMIN C) 500 MG tablet Take 1,000 mg by mouth daily.     aspirin 81 MG tablet Take 1 tablet (81 mg total) by mouth daily. 30 tablet    atorvastatin (LIPITOR) 20 MG tablet TAKE 1 TABLET BY MOUTH EVERY DAY IN THE EVENING (Patient taking differently: Take 20 mg by mouth daily.) 90 tablet 4   b complex vitamins tablet Take 1 tablet by mouth daily.     Blood Glucose Monitoring Suppl (ACCU-CHEK AVIVA PLUS) w/Device KIT As directed. 1 kit 0   Calcium Citrate (CITRACAL PO) Take 2 tablets by mouth daily.     Cholecalciferol (VITAMIN D3) 5000 UNITS TABS Take 10,000 Units by mouth in the morning and at  bedtime.     Chromium Picolinate 500 MCG TABS Take 500 mcg by mouth daily.      diclofenac Sodium (VOLTAREN) 1 % GEL Apply 2 g topically 3 (three) times daily as needed (knee pain).     fluticasone (FLONASE) 50 MCG/ACT nasal spray Place 2 sprays into both nostrils daily for 7 days. 16 g 0   furosemide (LASIX) 40 MG tablet Take 2 tablets (80 mg total) by mouth 2 (two) times daily. (Patient taking differently: Take 40 mg by mouth 2 (two) times daily.) 360 tablet 3   levalbuterol (XOPENEX HFA) 45 MCG/ACT inhaler Inhale 2 puffs into the lungs every  6 (six) hours as needed for wheezing. 45 g 3   lidocaine (LIDODERM) 5 % PLACE 1 PATCH ONTO THE SKIN DAILY AS NEEDED FOR KNEE PAIN. REMOVE AND DISCARD PATCH WITHIN 12 HOURS OR AS DIRECTED BY DOCTOR. (Patient taking differently: Place 1 patch onto the skin as needed (knee pain).) 30 patch 0   loratadine (CLARITIN) 10 MG tablet Take 1 tablet (10 mg total) by mouth daily for 10 days. 10 tablet 0   losartan (COZAAR) 25 MG tablet Take 1 tablet (25 mg total) by mouth 2 (two) times daily. 180 tablet 2   LYRICA 300 MG capsule Take 300 mg by mouth 2 (two) times daily.      MAGNESIUM PO Take 500 mg by mouth daily.      metroNIDAZOLE (FLAGYL) 500 MG tablet Take 1 tablet (500 mg total) by mouth 2 (two) times daily for 28 days. 56 tablet 0   Multiple Vitamin (MULTIVITAMIN) tablet Take 1 tablet by mouth daily.     NUEDEXTA 20-10 MG CAPS Take 1 capsule by mouth every 12 (twelve) hours.   12   Omega-3 Fatty Acids (FISH OIL) 1200 MG CAPS Take 2,400 mg by mouth daily.      oxyCODONE (OXY IR/ROXICODONE) 5 MG immediate release tablet Take 1 tablet (5 mg total) by mouth every 4 (four) hours as needed for moderate pain. 15 tablet 0   potassium chloride SA (KLOR-CON M20) 20 MEQ tablet Take 1 tablet (20 mEq total) by mouth daily. 90 tablet 4   Sodium Chloride Flush (NORMAL SALINE FLUSH) 0.9 % SOLN Use to flush drain 1-2 times daily. 600 mL 1   sulfamethoxazole-trimethoprim (BACTRIM DS) 800-160 MG tablet Take 2 tablets by mouth 2 (two) times daily for 28 days. 112 tablet 0   VRAYLAR 6 MG CAPS Take 6 mg by mouth every evening.      Zinc 50 MG CAPS Take 50 mg by mouth in the morning and at bedtime.     No current facility-administered medications on file prior to visit.    Past Medical History:  Diagnosis Date   Acute systolic congestive heart failure    Agoraphobia    Anxiety    panic attacks   Arthritis    osteoarthritis bilal. 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 Several times   Sleep apnea sleep study 07/02/2010   uses CPAP nightly   Spinal stenosis in cervical region    Urinary incontinence    Allergies  Allergen Reactions   Savella [Milnacipran Hcl] Other (See Comments)    mania    Social History   Socioeconomic History   Marital status: Widowed    Spouse name: Not on file   Number of children: Not on file   Years of education: Not on file   Highest education level: Bachelor's degree (e.g., BA, AB, BS)  Occupational History   Occupation: currently unemployed  Tobacco Use   Smoking status: Never   Smokeless tobacco: Never  Vaping Use   Vaping Use: Never used  Substance and Sexual Activity   Alcohol use: Yes    Comment: rare   Drug use: No   Sexual activity: Not Currently  Other Topics Concern   Not on file  Social History Narrative   Lives in [place]         Social Determinants of Health   Financial Resource Strain: Low Risk  (04/02/2022)   Overall Financial Resource Strain (CARDIA)    Difficulty of Paying Living Expenses: Not hard at all  Food Insecurity: No Food Insecurity (05/16/2022)   Hunger Vital Sign    Worried About Running Out of Food in the Last Year: Never true    Ran Out of Food in the Last Year: Never true  Transportation Needs: Unmet Transportation Needs (05/16/2022)   PRAPARE - Administrator, Civil Service (Medical): Yes    Lack of Transportation (Non-Medical): No  Physical Activity:  Insufficiently Active (04/02/2022)   Exercise Vital Sign    Days of Exercise per Week: 2 days    Minutes of Exercise per Session: 30 min  Stress: No Stress Concern Present (04/02/2022)   [employer] of Occupational Health - Occupational Stress Questionnaire    Feeling of Stress : Not at all  Social Connections: Moderately Integrated (04/02/2022)   Social Connection and Isolation Panel [NHANES]    Frequency of Communication with Friends and Family: More than three times a week    Frequency of Social Gatherings with Friends and Family: More than three times a week    Attends Religious Services: More than 4 times per year    Active Member of [employer] or Organizations: Yes    Attends Banker Meetings: More than 4 times per year    Marital Status: Widowed    Vitals:   05/23/22 1353  BP: 128/80  Pulse: 88  Resp: 16  SpO2: 98%   Body mass index is 42.29 kg/m.  Physical Exam Vitals and nursing note reviewed.  Constitutional:      General: She is not in acute distress.    Appearance: She is well-developed.  HENT:     Head: Normocephalic and atraumatic.     Mouth/Throat:     Mouth: Mucous membranes are moist.     Pharynx: Oropharynx is clear.  Eyes:     Conjunctiva/sclera: Conjunctivae normal.  Cardiovascular:     Rate and Rhythm: Normal rate and regular rhythm.     Pulses:          Dorsalis pedis pulses are 2+ on the right side and 2+ on the left side.  Heart sounds: No murmur heard. Pulmonary:     Effort: Pulmonary effort is normal. No respiratory distress.     Breath sounds: Normal breath sounds.  Abdominal:     Palpations: Abdomen is soft. There is no hepatomegaly or mass.     Tenderness: There is no abdominal tenderness.       Comments: Drain covered with dressing.***  Lymphadenopathy:     Cervical: No cervical adenopathy.  Skin:    General: Skin is warm.     Findings: No erythema or rash.  Neurological:     General: No focal deficit present.      Mental Status: She is alert and oriented to person, place, and time.     Cranial Nerves: No cranial nerve deficit.     Gait: Gait normal.  Psychiatric:     Comments: Well groomed, good eye contact.     ASSESSMENT AND PLAN:  [name] was seen today for hospitalization follow-up.  Diagnoses and all orders for this visit:  Intra-abdominal abscess -     Ambulatory referral to Home Health -     CBC; Future -     Basic metabolic panel; Future  Type 2 diabetes mellitus with other specified complication, without long-term current use of insulin  Physical deconditioning -     Ambulatory referral to Home Health  Essential hypertension, benign  Hypokalemia -     Basic metabolic panel; Future  Vulvovaginal candidiasis -     fluconazole (DIFLUCAN) 150 MG tablet; Take 1 tablet (150 mg total) by mouth once a week for 2 doses. -     terconazole (TERAZOL 7) 0.4 % vaginal cream; Place 1 applicator vaginally at bedtime.    Orders Placed This Encounter  Procedures   CBC   Basic metabolic panel   Ambulatory referral to Home Health    No problem-specific Assessment & Plan notes found for this encounter.   Return in about 3 months (around 08/22/2022) for chronic problems.  Ivionna Verley G. [place], MD  [facility] [place] office.

## 2022-05-22 NOTE — Progress Notes (Signed)
[name] is a 52 y.o. female patient   Treatment Plan: Date: 05/22/2022  Diagnosis 296.80 (Unspecified bipolar or related disorder) [n/a]  Symptoms Depressed or irritable mood. (Status: maintained) -- No Description Entered  Diminished interest in or enjoyment of activities. (Status: maintained) -- No Description Entered  Lack of energy. (Status: maintained) -- No Description Entered  Low self-esteem. (Status: maintained) -- No Description Entered  Medication Status compliance  Safety none  If Suicidal or Homicidal State Action Taken: unspecified  Current Risk: low Medications Adderall (Dosage:  4X/day)  Klonapin (Dosage:  3X/day)  Lamictal (Dosage:  2x/day)  Nuedexta (Dosage: )  Vyralar (Dosage: )  Xanax (Dosage:  3X/day)  Objectives Related Problem: Develop healthy cognitive patterns and beliefs about self and the world that lead to alleviation and help prevent the relapse of mood episodes. Description: State no longer having thoughts of self-harm. Target Date: 2023-01-08 Frequency: Daily Modality: individual Progress: 80%  Related Problem: Develop healthy cognitive patterns and beliefs about self and the world that lead to alleviation and help prevent the relapse of mood episodes. Description: Verbalize any history of past and present suicidal thoughts and actions. Target Date: 2023-01-08 Frequency: Daily Modality: individual Progress: 80%  Related Problem: Develop healthy cognitive patterns and beliefs about self and the world that lead to alleviation and help prevent the relapse of mood episodes. Description: Take prescribed medications as directed. Target Date: 2023-01-08 Frequency: Daily Modality: individual Progress: 90%  Related Problem: Develop healthy cognitive patterns and beliefs about self and the world that lead to alleviation and  help prevent the relapse of mood episodes. Description: Discuss and resolve troubling personal and interpersonal issues. Target Date: 2023-01-08 Frequency: Daily Modality: individual Progress: 80% Resolve complicated grief       Target Date: 12-24       Progress: 20%  Client Response full compliance  Service Location Location, [address] [place], [place] [address] -virtual Service Code cpt [phone]  Emotion regulation skills  Related past to present  Rationally challenge thoughts or beliefs/cognitive restructuring  Identify/label emotions  Validate/empathize  Self care activities  Lifestyle change (exercise, nutrition)  Self-monitoring  Facilitate problem solving  Session notes:  f31.9 Bipolar Unspecified.  Goals: Mood stabilization, improved marital relationship, improve self esteem and elimination of self harm thoughts/tendencies. Also, patient struggles with leaving home comfortably and seeks to improve this ability. Target date for these goals is January, 2023. In addition, she needs to improve self care behaviors due to her weight and various medical conditions. Target date for this goal is Feb., 2022. Date revised to December, 2023 to allow time for behavioral implementation. Reports periodic outbursts of rage/anger. Seeks to minimize. Target date is December, 2023. Patient has realized improvement in all areas. Requests additional treatment to facilitate maintenance of progress. Goal date 12-24. Patient's husband died unexpectedly and she is suffering complicated grief. She needs additional counseling to address associated issues and manage feelings of loss, despair and anxiety. Goal date 12-24.  Meds: Vyralar ( ),Nuedexta (40+20mg /day), Adderall (  4X/day), Xanax ( , 3X/day), and Klonopin (  3X/day).  Patient has requested a video session. She is at home and I am in my home office.  [name] says she is in pain at the  site of her drain. It started yesterday and she  is going to doctor tomorrow. Suggested that she let doctor's office know today about the pain she is experiencing. Her friend [name] came over and cooked for [name] over the weekend. [name] is doing better with regard to regular eating.She states that her father is continually posting about her mother and that he is very focused on the May 25 event to celebrate her life. [name] anticipates spending more time with him once she feels better. She was not especially engaged as she was not feeling well and is fatigued. Says moods are relatively stable.                                                                                                          [name]                                                                                                                                                                                        [name], PhD  Time:2:15p-3:00p 45 minutes.                           n     n

## 2022-05-23 ENCOUNTER — Ambulatory Visit (INDEPENDENT_AMBULATORY_CARE_PROVIDER_SITE_OTHER): Payer: Medicare PPO | Admitting: Family Medicine

## 2022-05-23 ENCOUNTER — Encounter: Payer: Self-pay | Admitting: Family Medicine

## 2022-05-23 VITALS — BP 128/80 | HR 88 | Resp 16 | Ht 65.0 in | Wt 254.1 lb

## 2022-05-23 DIAGNOSIS — E876 Hypokalemia: Secondary | ICD-10-CM

## 2022-05-23 DIAGNOSIS — I10 Essential (primary) hypertension: Secondary | ICD-10-CM | POA: Diagnosis not present

## 2022-05-23 DIAGNOSIS — R5381 Other malaise: Secondary | ICD-10-CM

## 2022-05-23 DIAGNOSIS — B3731 Acute candidiasis of vulva and vagina: Secondary | ICD-10-CM | POA: Diagnosis not present

## 2022-05-23 DIAGNOSIS — E1169 Type 2 diabetes mellitus with other specified complication: Secondary | ICD-10-CM | POA: Diagnosis not present

## 2022-05-23 DIAGNOSIS — K651 Peritoneal abscess: Secondary | ICD-10-CM

## 2022-05-23 MED ORDER — FLUCONAZOLE 150 MG PO TABS
150.0000 mg | ORAL_TABLET | ORAL | 0 refills | Status: AC
Start: 2022-05-23 — End: 2022-05-31

## 2022-05-23 MED ORDER — TERCONAZOLE 0.4 % VA CREA
1.0000 | TOPICAL_CREAM | Freq: Every day | VAGINAL | 0 refills | Status: DC
Start: 2022-05-23 — End: 2022-08-08

## 2022-05-23 NOTE — Patient Instructions (Addendum)
A few things to remember from today's visit:  Intra-abdominal abscess - Plan: Ambulatory referral to Home Health, CBC, Basic metabolic panel  Type 2 diabetes mellitus with other specified complication, without long-term current use of insulin  Physical deconditioning - Plan: Ambulatory referral to Home Health  Essential hypertension, benign  Hypokalemia - Plan: Basic metabolic panel  No changes today. Monitor blood sugar. PT at home will be arranged.  If you need refills for medications you take chronically, please call your pharmacy. Do not use My Chart to request refills or for acute issues that need immediate attention. If you send a my chart message, it may take a few days to be addressed, specially if I am not in the office.  Please be sure medication list is accurate. If a new problem present, please set up appointment sooner than planned today.

## 2022-05-24 LAB — CBC
HCT: 41.4 % (ref 36.0–46.0)
Hemoglobin: 13.6 g/dL (ref 12.0–15.0)
MCHC: 32.8 g/dL (ref 30.0–36.0)
MCV: 89 fl (ref 78.0–100.0)
Platelets: 251 10*3/uL (ref 150.0–400.0)
RBC: 4.66 Mil/uL (ref 3.87–5.11)
RDW: 16.7 % — ABNORMAL HIGH (ref 11.5–15.5)
WBC: 4.6 10*3/uL (ref 4.0–10.5)

## 2022-05-24 LAB — BASIC METABOLIC PANEL
BUN: 11 mg/dL (ref 6–23)
CO2: 25 mEq/L (ref 19–32)
Calcium: 9.1 mg/dL (ref 8.4–10.5)
Chloride: 104 mEq/L (ref 96–112)
Creatinine, Ser: 0.87 mg/dL (ref 0.40–1.20)
GFR: 76.56 mL/min (ref >60.00)
Glucose, Bld: 157 mg/dL — ABNORMAL HIGH (ref 70–99)
Potassium: 4.8 mEq/L (ref 3.5–5.1)
Sodium: 138 mEq/L (ref 135–145)

## 2022-05-24 NOTE — Assessment & Plan Note (Signed)
BP adequately controlled. Continue losartan 50 mg daily and low salt diet. Continue monitoring BP regularly.

## 2022-05-24 NOTE — Assessment & Plan Note (Signed)
Recurrent sigmoid diverticulitis that may required colectomy later on. Currently on Bactrim DS 2 tabs bid and Metronidazole 500 mg bid to complete 4 weeks. Continue monitoring for fever and GI symptoms. She has an appt coming to see her surgeon 06/13/22 and radiologist appointment on May 1 for abdominal/pelvic CT with contrast for fistula evaluation.

## 2022-05-24 NOTE — Assessment & Plan Note (Signed)
HgA1C went up from 5.9 to 7.3.  We discussed treatment options, I recommend not resuming Metformin at this time given the fact she is still recovering from diverticulitis with abscess and pending imaging to evaluate for presence of fistula. She prefers to continue non pharmacologic treatment for now. Monitor BS's at home. Will plan on HgA1C in 3 months.

## 2022-05-25 DIAGNOSIS — J45909 Unspecified asthma, uncomplicated: Secondary | ICD-10-CM | POA: Diagnosis not present

## 2022-05-25 DIAGNOSIS — E1169 Type 2 diabetes mellitus with other specified complication: Secondary | ICD-10-CM | POA: Diagnosis not present

## 2022-05-25 DIAGNOSIS — I5042 Chronic combined systolic (congestive) and diastolic (congestive) heart failure: Secondary | ICD-10-CM | POA: Diagnosis not present

## 2022-05-25 DIAGNOSIS — I272 Pulmonary hypertension, unspecified: Secondary | ICD-10-CM | POA: Diagnosis not present

## 2022-05-25 DIAGNOSIS — K651 Peritoneal abscess: Secondary | ICD-10-CM | POA: Diagnosis not present

## 2022-05-25 DIAGNOSIS — I110 Hypertensive heart disease with heart failure: Secondary | ICD-10-CM | POA: Diagnosis not present

## 2022-05-25 DIAGNOSIS — F410 Panic disorder [episodic paroxysmal anxiety] without agoraphobia: Secondary | ICD-10-CM | POA: Diagnosis not present

## 2022-05-25 DIAGNOSIS — G4733 Obstructive sleep apnea (adult) (pediatric): Secondary | ICD-10-CM | POA: Diagnosis not present

## 2022-05-25 DIAGNOSIS — F319 Bipolar disorder, unspecified: Secondary | ICD-10-CM | POA: Diagnosis not present

## 2022-05-29 ENCOUNTER — Ambulatory Visit (INDEPENDENT_AMBULATORY_CARE_PROVIDER_SITE_OTHER): Payer: Medicare PPO | Admitting: Psychology

## 2022-05-29 DIAGNOSIS — F319 Bipolar disorder, unspecified: Secondary | ICD-10-CM | POA: Diagnosis not present

## 2022-05-29 NOTE — Progress Notes (Signed)
[name] is a 52 y.o. female patient   Treatment Plan: Date: 05/29/2022  Diagnosis 296.80 (Unspecified bipolar or related disorder) [n/a]  Symptoms Depressed or irritable mood. (Status: maintained) -- No Description Entered  Diminished interest in or enjoyment of activities. (Status: maintained) -- No Description Entered  Lack of energy. (Status: maintained) -- No Description Entered  Low self-esteem. (Status: maintained) -- No Description Entered  Medication Status compliance  Safety none  If Suicidal or Homicidal State Action Taken: unspecified  Current Risk: low Medications Adderall (Dosage: 20mg  4X/day)  Klonapin (Dosage: 1mg  3X/day)  Lamictal (Dosage: 200mg  2x/day)  Nuedexta (Dosage: 20mg )  Vyralar (Dosage: 6mg )  Xanax (Dosage: 1mg  3X/day)  Objectives Related Problem: Develop healthy cognitive patterns and beliefs about self and the world that lead to alleviation and help prevent the relapse of mood episodes. Description: State no longer having thoughts of self-harm. Target Date: 2023-01-08 Frequency: Daily Modality: individual Progress: 80%  Related Problem: Develop healthy cognitive patterns and beliefs about self and the world that lead to alleviation and help prevent the relapse of mood episodes. Description: Verbalize any history of past and present suicidal thoughts and actions. Target Date: 2023-01-08 Frequency: Daily Modality: individual Progress: 80%  Related Problem: Develop healthy cognitive patterns and beliefs about self and the world that lead to alleviation and help prevent the relapse of mood episodes. Description: Take prescribed medications as directed. Target Date: 2023-01-08 Frequency: Daily Modality: individual Progress: 90%  Related Problem: Develop healthy cognitive patterns and beliefs about self and the world  that lead to alleviation and help prevent the relapse of mood episodes. Description: Discuss and resolve troubling personal and interpersonal issues. Target Date: 2023-01-08 Frequency: Daily Modality: individual Progress: 80% Resolve complicated grief       Target Date: 12-24       Progress: 20%  Client Response full compliance  Service Location Location, [address] [place], [place] [address] -virtual Service Code cpt [phone]  Emotion regulation skills  Related past to present  Rationally challenge thoughts or beliefs/cognitive restructuring  Identify/label emotions  Validate/empathize  Self care activities  Lifestyle change (exercise, nutrition)  Self-monitoring  Facilitate problem solving  Session notes:  f31.9 Bipolar Unspecified.  Goals: Mood stabilization, improved marital relationship, improve self esteem and elimination of self harm thoughts/tendencies. Also, patient struggles with leaving home comfortably and seeks to improve this ability. Target date for these goals is January, 2023. In addition, she needs to improve self care behaviors due to her weight and various medical conditions. Target date for this goal is Feb., 2022. Date revised to December, 2023 to allow time for behavioral implementation. Reports periodic outbursts of rage/anger. Seeks to minimize. Target date is December, 2023. Patient has realized improvement in all areas. Requests additional treatment to facilitate maintenance of progress. Goal date 12-24. Patient's husband died unexpectedly and she is suffering complicated grief. She needs additional counseling to address associated issues and manage feelings of loss, despair and anxiety. Goal date 12-24.  Meds: Vyralar (6mg ),Nuedexta (40+20mg /day), Adderall (20mg  4X/day), Xanax (1mg , 3X/day), and Klonopin (1mg  3X/day).  Patient has requested a video session. She is at home and  I am in my home office.  [name] says that she is not able to bathe "normally,  due to her drain. She has to have sponge baths and needs help. [name] is the one who helps her. She has an appointment tomorrow with radiologist to inject her drain to determine if she is healing appropriately. She hopes, if all is well, that they will be able to remove the drain. Tried to have a discussion with [name] about finding an activity, of any type, that will help motivate help to get up and moving during the day. Suggested anything related to a hobby or her tutoring or volunteering. She is physically too uncomfortable to do anything involving other people. Until she is feeling better she will need to find something more solitary that she can do in her own home. We talk about re-engaging in painting as something that will help keep her interest and motivation. She will consider and talk with friend to help her set up.                                                                                                               [name], PhD  Time:2:15p-3:00p 45 minutes.                           n     n

## 2022-05-30 ENCOUNTER — Ambulatory Visit
Admission: RE | Admit: 2022-05-30 | Discharge: 2022-05-30 | Disposition: A | Discharge status: Home / Self Care | Payer: Medicare PPO | Source: Ambulatory Visit | Attending: Radiology | Admitting: Radiology

## 2022-05-30 ENCOUNTER — Ambulatory Visit
Admission: RE | Admit: 2022-05-30 | Discharge: 2022-05-30 | Disposition: A | Discharge status: Home / Self Care | Payer: Medicare PPO | Source: Ambulatory Visit | Attending: Surgery | Admitting: Surgery

## 2022-05-30 DIAGNOSIS — R197 Diarrhea, unspecified: Secondary | ICD-10-CM

## 2022-05-30 DIAGNOSIS — I700 Atherosclerosis of aorta: Secondary | ICD-10-CM | POA: Diagnosis not present

## 2022-05-30 DIAGNOSIS — K651 Peritoneal abscess: Secondary | ICD-10-CM

## 2022-05-30 DIAGNOSIS — R110 Nausea: Secondary | ICD-10-CM

## 2022-05-30 DIAGNOSIS — R630 Anorexia: Secondary | ICD-10-CM

## 2022-05-30 DIAGNOSIS — K5720 Diverticulitis of large intestine with perforation and abscess without bleeding: Secondary | ICD-10-CM | POA: Diagnosis not present

## 2022-05-30 DIAGNOSIS — R5383 Other fatigue: Secondary | ICD-10-CM

## 2022-05-30 DIAGNOSIS — R1032 Left lower quadrant pain: Secondary | ICD-10-CM

## 2022-05-30 MED ORDER — IOPAMIDOL (ISOVUE-300) INJECTION 61%
5.0000 mL | Freq: Once | INTRAVENOUS | Status: AC
  Administered 2022-05-30: 5 mL

## 2022-05-30 MED ORDER — IOPAMIDOL (ISOVUE-300) INJECTION 61%
125.0000 mL | Freq: Once | INTRAVENOUS | Status: AC | PRN
  Administered 2022-05-30: 125 mL via INTRAVENOUS

## 2022-05-30 NOTE — Progress Notes (Addendum)
Referring Physician(s): Omohundro,Jennifer C  Chief Complaint: The patient is seen in follow up today s/p LLQ drain placement 05/11/22  History of present illness:  [name] is a 52 yo female who was originally seen for placement of 4 abdominal drains 12/19/21 for perforated diverticulitis and post-operative fluid collections.  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 She presented for monthly drain checks following that, until 04/18/22 when her last drain was removed after fluoroscopic imaging suggested no persistent fistula. 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 Abscess fluid culture revealed presence of E. Coli and Bacteroides thetaiotaomicron. She was subsequently discharged on 05/15/22. Patient was placed on Bactrim DS 2 tablets BID and metronidazole 500mg  BID for a 4-week treatment course. Patient was discharged on 05/15/22 and presents today for follow-up visit, CT imaging, and drain injection under fluoroscopy.   On presentation today, she reports feeling generally well. She denies fever, chills, nausea, vomiting, and diarrhea. She states that she has not had any abdominal pain since being discharged, nor has she had any drainage from her drain insertion site.  She is flushing her site every other day, changing her dressing 2-3x per week, and having ~10 mL of brown output daily. She reports she is scheduled for follow-up with her surgeon on 06/13/22.   Past Medical History:  Diagnosis Date   Acute systolic congestive heart failure (HCC)    Agoraphobia    Anxiety    panic attacks   Arthritis    osteoarthritis bilal. knees   Asthma    no per PFT 7/13; reports does not have asthma   Bipolar disorder (HCC)    Carpal tunnel syndrome of left wrist 05/2011   Being evaluated for MS    Cor pulmonale (HCC)    Depression    History of pleural effusion    Hyperlipidemia    Hypertension    Hypoxemia    history of - no home O2   IBS (irritable bowel syndrome)    Morbid obesity (HCC)    Obesity hypoventilation syndrome (HCC)    Perforation of sigmoid colon due to diverticulitis 12/13/2021   Pneumonia    Pre-diabetes    Prediabetes    Seasonal allergies    current runny nose   Seizures (HCC)    febrile seizure x 1 as a child. Several times   Sleep apnea sleep study 07/02/2010   uses CPAP nightly   Spinal stenosis in cervical region    Urinary incontinence     Past Surgical History:  Procedure Laterality Date   APPLICATION OF INTRAOPERATIVE CT SCAN N/A 05/14/2019   Procedure: APPLICATION OF INTRAOPERATIVE CT SCAN;  Surgeon: Maeola Harman, MD;  Location: [facility] OR;  Service: Neurosurgery;  Laterality: N/A;   CARPAL TUNNEL RELEASE  06/27/2011   Procedure: CARPAL TUNNEL RELEASE;  Surgeon: Nicki Reaper, MD;  Location: [facility];  Service: Orthopedics;  Laterality: Left;   CARPAL TUNNEL RELEASE  12/19/2011   Procedure: CARPAL TUNNEL RELEASE;  Surgeon: Nicki Reaper, MD;  Location: [facility];  Service: Orthopedics;  Laterality: Right;   INCISION AND DRAINAGE ABSCESS Left 08/27/2017   Procedure: INCISION AND DRAINAGE LEFT BUTTOCK  ABSCESS;  Surgeon: Glenna Fellows, MD;  Location: [facility] ORS;  Service:  General;  Laterality: Left;   IR RADIOLOGIST EVAL & MGMT  03/16/2022   IR RADIOLOGIST EVAL & MGMT  04/18/2022   IR SINUS/FIST TUBE CHK-NON GI  01/03/2022   IR SINUS/FIST TUBE CHK-NON GI  01/03/2022   IR SINUS/FIST TUBE CHK-NON GI  01/03/2022   IR SINUS/FIST TUBE CHK-NON GI  01/03/2022   IRRIGATION AND DEBRIDEMENT BUTTOCKS Left 08/30/2017   Procedure: IRRIGATION AND DEBRIDEMENT RE-EXCISION OF LEFT SUBCUTANEOUS BUTTOCKS ABCESS;  Surgeon: Rodman Pickle, MD;  Location: [facility];  Service: General;  Laterality: Left;   LAPAROSCOPY N/A 12/11/2021    Procedure: LAPAROSCOPIC PERITONEAL LAVAGE WITH DRAIN PLACEMENT;  Surgeon: Quentin Ore, MD;  Location: [facility] OR;  Service: General;  Laterality: N/A;   MASS EXCISION  08/07/2010   right index   POSTERIOR CERVICAL FUSION/FORAMINOTOMY  09/11/2011   Procedure: POSTERIOR CERVICAL FUSION/FORAMINOTOMY LEVEL 1;  Surgeon: Maeola Harman, MD;  Location: [facility];  Service: Neurosurgery;  Laterality: N/A;  Cervical Three-Four Posteior Cervical Fusion and Decompression.   POSTERIOR CERVICAL FUSION/FORAMINOTOMY N/A 05/14/2019   Procedure: Posterior cervical decompression/fusion Cervical four to Thoracic one with exploration/revision of Cervical threee-four fusion;  Surgeon: Maeola Harman, MD;  Location: [facility] OR;  Service: Neurosurgery;  Laterality: N/A;   TRIGGER FINGER RELEASE  12/19/2011   Procedure: RELEASE TRIGGER FINGER/A-1 PULLEY;  Surgeon: Nicki Reaper, MD;  Location: [facility];  Service: Orthopedics;  Laterality: Right;   TRIGGER FINGER RELEASE Left 12/31/2012   Procedure: RELEASE A-1 PULLEY LEFT THUMB;  Surgeon: Nicki Reaper, MD;  Location: [facility];  Service: Orthopedics;  Laterality: Left;   WISDOM TOOTH EXTRACTION      Allergies: Savella [milnacipran hcl]  Medications: Prior to Admission medications   Medication Sig Start Date End Date Taking? Authorizing Provider  acetaminophen (TYLENOL) 325 MG tablet Take 2 tablets (650 mg total) by mouth every 6 (six) hours as needed for mild pain (or Fever >/= 101). 05/15/22   Rodolph Bong, MD  albuterol (VENTOLIN HFA) 108 (90 Base) MCG/ACT inhaler Inhale 2 puffs into the lungs every 6 (six) hours as needed for wheezing or shortness of breath. 10/06/20   Oretha Milch, MD  ALPRAZolam (XANAX XR) 1 MG 24 hr tablet Take 1 mg by mouth in the morning and at bedtime. Taking twice daily along with 1mg  tablet    [provider]  ALPRAZolam (XANAX) 1 MG tablet Take 1 mg by mouth 2 (two) times daily.    [provider]  amphetamine-dextroamphetamine (ADDERALL) 20 MG tablet Take 20 mg by mouth in the morning, at noon, in the evening, and at bedtime.     [provider]  Ascorbic Acid (VITAMIN C) 500 MG tablet Take 1,000 mg by mouth daily.    [provider]  aspirin 81 MG tablet Take 1 tablet (81 mg total) by mouth daily. 05/21/19   Costella, Darci Current, PA-C  atorvastatin (LIPITOR) 20 MG tablet TAKE 1 TABLET BY MOUTH EVERY DAY IN THE EVENING Patient taking differently: Take 20 mg by mouth daily. 03/15/21   [name], MD  b complex vitamins tablet Take 1 tablet by mouth daily.    [provider]  Blood Glucose Monitoring Suppl (ACCU-CHEK AVIVA PLUS) w/Device KIT As directed. 11/14/21   [place], Betty G, MD  Calcium Citrate (CITRACAL PO) Take 2 tablets by mouth daily.    [provider]  Cholecalciferol (VITAMIN D3) 5000 UNITS TABS Take 10,000 Units by mouth in the  morning and at bedtime.    [provider]  Chromium Picolinate 500 MCG TABS Take 500 mcg by mouth daily.     [provider]  diclofenac Sodium (VOLTAREN) 1 % GEL Apply 2 g topically 3 (three) times daily as needed (knee pain).    [provider]  fluconazole (DIFLUCAN) 150 MG tablet Take 1 tablet (150 mg total) by mouth once a week for 2 doses. 05/23/22 05/31/22  [place], Betty G, MD  fluticasone (FLONASE) 50 MCG/ACT nasal spray Place 2 sprays into both nostrils daily for 7 days. 05/16/22 05/23/22  Rodolph Bong, MD  furosemide (LASIX) 40 MG tablet Take 2 tablets (80 mg total) by mouth 2 (two) times daily. Patient taking differently: Take 40 mg by mouth 2 (two) times daily. 12/05/21   [name], MD  levalbuterol [facility] HFA) 45 MCG/ACT inhaler Inhale 2 puffs into the lungs every 6 (six) hours as needed for wheezing. 10/14/20   Oretha Milch, MD  lidocaine (LIDODERM) 5 % PLACE 1 PATCH ONTO THE SKIN DAILY AS NEEDED FOR KNEE PAIN. REMOVE AND DISCARD PATCH WITHIN 12 HOURS  OR AS DIRECTED BY DOCTOR. Patient taking differently: Place 1 patch onto the skin as needed (knee pain). 05/22/21   [place], Betty G, MD  loratadine (CLARITIN) 10 MG tablet Take 1 tablet (10 mg total) by mouth daily for 10 days. 05/16/22 05/26/22  Rodolph Bong, MD  losartan (COZAAR) 25 MG tablet Take 1 tablet (25 mg total) by mouth 2 (two) times daily. 12/05/21   [name], MD  LYRICA 300 MG capsule Take 300 mg by mouth 2 (two) times daily.  03/26/11   [provider]  MAGNESIUM PO Take 500 mg by mouth daily.     [provider]  metroNIDAZOLE (FLAGYL) 500 MG tablet Take 1 tablet (500 mg total) by mouth 2 (two) times daily for 28 days. 05/15/22 06/12/22  Rodolph Bong, MD  Multiple Vitamin (MULTIVITAMIN) tablet Take 1 tablet by mouth daily.    [provider]  NUEDEXTA 20-10 MG CAPS Take 1 capsule by mouth every 12 (twelve) hours.  09/27/16   [provider]  Omega-3 Fatty Acids (FISH OIL) 1200 MG CAPS Take 2,400 mg by mouth daily.     [provider]  oxyCODONE (OXY IR/ROXICODONE) 5 MG immediate release tablet Take 1 tablet (5 mg total) by mouth every 4 (four) hours as needed for moderate pain. 05/15/22   Rodolph Bong, MD  potassium chloride SA (KLOR-CON M20) 20 MEQ tablet Take 1 tablet (20 mEq total) by mouth daily. 04/14/21   [name], MD  Sodium Chloride Flush (NORMAL SALINE FLUSH) 0.9 % SOLN Use to flush drain 1-2 times daily. 02/16/22     sulfamethoxazole-trimethoprim (BACTRIM DS) 800-160 MG tablet Take 2 tablets by mouth 2 (two) times daily for 28 days. 05/15/22 06/12/22  Rodolph Bong, MD  terconazole (TERAZOL 7) 0.4 % vaginal cream Place 1 applicator vaginally at bedtime. 05/23/22   [place], Betty G, MD  VRAYLAR 6 MG CAPS Take 6 mg by mouth every evening.  09/03/16   [provider]  Zinc 50 MG CAPS Take 50 mg by mouth in the morning and at bedtime.    [provider]     Family History  Problem Relation Age  of Onset   Asthma Mother    Allergic rhinitis Mother    Multiple sclerosis Mother    Asthma Maternal Grandmother    Breast cancer Neg  Hx     Social History   Socioeconomic History   Marital status: Widowed    Spouse name: Not on file   Number of children: Not on file   Years of education: Not on file   Highest education level: Bachelor's degree (e.g., BA, AB, BS)  Occupational History   Occupation: currently unemployed  Tobacco Use   Smoking status: Never   Smokeless tobacco: Never  Vaping Use   Vaping Use: Never used  Substance and Sexual Activity   Alcohol use: Yes    Comment: rare   Drug use: No   Sexual activity: Not Currently  Other Topics Concern   Not on file  Social History Narrative   Lives in [place]         Social Determinants of Health   Financial Resource Strain: Low Risk  (04/02/2022)   Overall Financial Resource Strain (CARDIA)    Difficulty of Paying Living Expenses: Not hard at all  Food Insecurity: No Food Insecurity (05/16/2022)   Hunger Vital Sign    Worried About Running Out of Food in the Last Year: Never true    Ran Out of Food in the Last Year: Never true  Transportation Needs: Unmet Transportation Needs (05/16/2022)   PRAPARE - Administrator, Civil Service (Medical): Yes    Lack of Transportation (Non-Medical): No  Physical Activity: Insufficiently Active (04/02/2022)   Exercise Vital Sign    Days of Exercise per Week: 2 days    Minutes of Exercise per Session: 30 min  Stress: No Stress Concern Present (04/02/2022)   [employer] of Occupational Health - Occupational Stress Questionnaire    Feeling of Stress : Not at all  Social Connections: Moderately Integrated (04/02/2022)   Social Connection and Isolation Panel [NHANES]    Frequency of Communication with Friends and Family: More than three times a week    Frequency of Social Gatherings with Friends and Family: More than three times a week    Attends Religious Services:  More than 4 times per year    Active Member of [employer] or Organizations: Yes    Attends Banker Meetings: More than 4 times per year    Marital Status: Widowed     Vital Signs: T: 98.52F; BP: 109/66; HR: 74; SpO2: 92%  Physical Exam Vitals reviewed.  Constitutional:      General: She is not in acute distress.    Appearance: She is not ill-appearing.  Eyes:     Pupils: Pupils are equal, round, and reactive to light.  Cardiovascular:     Rate and Rhythm: Normal rate and regular rhythm.     Pulses: Normal pulses.  Pulmonary:     Effort: Pulmonary effort is normal.  Abdominal:     Palpations: Abdomen is soft.     Tenderness: There is no abdominal tenderness.     Comments: LLQ drain in place currently attached to JP bulb. Drain insertion site without bleeding, erythema, induration, drainage, or other signs of infection. Suture and stat lock in place. ~10 mL of feculent material in bulb at time of exam. Site dressed appropriately, dressing is clean, dry, and intact  Skin:    General: Skin is warm and dry.  Neurological:     Mental Status: She is alert and oriented to person, place, and time.  Psychiatric:        Mood and Affect: Mood normal.        Behavior: Behavior normal.  Thought Content: Thought content normal.        Judgment: Judgment normal.     Imaging: No results found.  Labs:  CBC: Recent Labs    05/13/22 0502 05/14/22 0350 05/15/22 0455 05/23/22 1441  WBC 4.8 4.6 4.9 4.6  HGB 12.7 14.3 14.8 13.6  HCT 40.4 44.2 45.9 41.4  PLT 400 442* 409* 251.0    COAGS: Recent Labs    05/11/22 1118  INR 1.1    BMP: Recent Labs    05/12/22 0257 05/13/22 0502 05/14/22 0350 05/15/22 0455 05/23/22 1441  NA 139 136 138 136 138  K 3.7 3.2* 3.9 3.9 4.8 hemolyzed sample  CL 98 98 96* 96* 104  CO2 28 31 33* 30 25  GLUCOSE 120* 133* 137* 137* 157*  BUN 12 6 5* 5* 11  CALCIUM 9.4 8.8* 9.1 9.1 9.1  CREATININE 0.81 0.95 0.91 0.84 0.87  GFRNONAA  >60 >60 >60 >60  --     LIVER FUNCTION TESTS: Recent Labs    01/01/22 0517 05/10/22 1448 05/11/22 0544 05/12/22 0257  BILITOT 0.2* 0.9 0.8 0.9  AST 29 29 33 19  ALT 22 27 29 22   ALKPHOS 98 95 79 74  PROT 6.0* 6.8 6.2* 6.0*  ALBUMIN 1.5* 2.7* 2.3* 2.2*    Assessment:  [name] is a 52 yo female being seen today in follow-up for intra-abdominal abscess s/p LLQ drain placement 05/11/22. On today's visit, she reports no concerns with her drain. She continues to take her prescribed Bactrim DS and Metronidazole. Drain is functioning well on today's visit, it flushes and aspirates easily. 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 Drain injection under fluoroscopy was indicative of fistula to the sigmoid colon as well. Drain was switched to gravity bag following drain injection, and patient was instructed to stop flushing her drain. She was advised to keep her surgical follow-up appointment on 06/13/22. She was advised to contact [facility] with concerns regarding her drain if they arise, and to seek medical treatment if she develops fever, chills, altered mental status, or other signs of infection. Patient voiced her understanding of these return precautions.   Signed: Kennieth Francois, PA-C 05/30/2022, 12:42 PM   Please refer to Dr. Antonietta Jewel attestation of this note for management and plan.

## 2022-05-31 ENCOUNTER — Other Ambulatory Visit (HOSPITAL_COMMUNITY): Payer: Self-pay

## 2022-05-31 ENCOUNTER — Other Ambulatory Visit: Payer: Self-pay

## 2022-05-31 ENCOUNTER — Other Ambulatory Visit: Payer: Self-pay | Admitting: Pulmonary Disease

## 2022-05-31 DIAGNOSIS — G4733 Obstructive sleep apnea (adult) (pediatric): Secondary | ICD-10-CM | POA: Diagnosis not present

## 2022-05-31 MED ORDER — ALBUTEROL SULFATE HFA 108 (90 BASE) MCG/ACT IN AERS
2.0000 | INHALATION_SPRAY | Freq: Four times a day (QID) | RESPIRATORY_TRACT | 0 refills | Status: AC | PRN
  Filled 2022-05-31: qty 20.1, 75d supply, fill #0

## 2022-06-01 DIAGNOSIS — I110 Hypertensive heart disease with heart failure: Secondary | ICD-10-CM | POA: Diagnosis not present

## 2022-06-01 DIAGNOSIS — I272 Pulmonary hypertension, unspecified: Secondary | ICD-10-CM | POA: Diagnosis not present

## 2022-06-01 DIAGNOSIS — J45909 Unspecified asthma, uncomplicated: Secondary | ICD-10-CM | POA: Diagnosis not present

## 2022-06-01 DIAGNOSIS — G4733 Obstructive sleep apnea (adult) (pediatric): Secondary | ICD-10-CM | POA: Diagnosis not present

## 2022-06-01 DIAGNOSIS — F319 Bipolar disorder, unspecified: Secondary | ICD-10-CM | POA: Diagnosis not present

## 2022-06-01 DIAGNOSIS — F410 Panic disorder [episodic paroxysmal anxiety] without agoraphobia: Secondary | ICD-10-CM | POA: Diagnosis not present

## 2022-06-01 DIAGNOSIS — E1169 Type 2 diabetes mellitus with other specified complication: Secondary | ICD-10-CM | POA: Diagnosis not present

## 2022-06-01 DIAGNOSIS — I5042 Chronic combined systolic (congestive) and diastolic (congestive) heart failure: Secondary | ICD-10-CM | POA: Diagnosis not present

## 2022-06-01 DIAGNOSIS — K651 Peritoneal abscess: Secondary | ICD-10-CM | POA: Diagnosis not present

## 2022-06-05 ENCOUNTER — Ambulatory Visit (INDEPENDENT_AMBULATORY_CARE_PROVIDER_SITE_OTHER): Payer: Medicare PPO | Admitting: Psychology

## 2022-06-05 DIAGNOSIS — F319 Bipolar disorder, unspecified: Secondary | ICD-10-CM

## 2022-06-05 NOTE — Progress Notes (Signed)
GLADYSE DUREE is a 52 y.o. female patient   Treatment Plan: Date: 06/05/2022  Diagnosis 296.80 (Unspecified bipolar or related disorder) [n/a]  Symptoms Depressed or irritable mood. (Status: maintained) -- No Description Entered  Diminished interest in or enjoyment of activities. (Status: maintained) -- No Description Entered  Lack of energy. (Status: maintained) -- No Description Entered  Low self-esteem. (Status: maintained) -- No Description Entered  Medication Status compliance  Safety none  If Suicidal or Homicidal State Action Taken: unspecified  Current Risk: low Medications Adderall (Dosage: 20mg  4X/day)  Klonapin (Dosage: 1mg  3X/day)  Lamictal (Dosage: 200mg  2x/day)  Nuedexta (Dosage: 20mg )  Vyralar (Dosage: 6mg )  Xanax (Dosage: 1mg  3X/day)  Objectives Related Problem: Develop healthy cognitive patterns and beliefs about self and the world that lead to alleviation and help prevent the relapse of mood episodes. Description: State no longer having thoughts of self-harm. Target Date: 2023-01-08 Frequency: Daily Modality: individual Progress: 80%  Related Problem: Develop healthy cognitive patterns and beliefs about self and the world that lead to alleviation and help prevent the relapse of mood episodes. Description: Verbalize any history of past and present suicidal thoughts and actions. Target Date: 2023-01-08 Frequency: Daily Modality: individual Progress: 80%  Related Problem: Develop healthy cognitive patterns and beliefs about self and the world that lead to alleviation and help prevent the relapse of mood episodes. Description: Take prescribed medications as directed. Target Date: 2023-01-08 Frequency: Daily Modality: individual Progress: 90%  Related Problem: Develop healthy cognitive patterns and  beliefs about self and the world that lead to alleviation and help prevent the relapse of mood episodes. Description: Discuss and resolve troubling personal and interpersonal issues. Target Date: 2023-01-08 Frequency: Daily Modality: individual Progress: 80% Resolve complicated grief       Target Date: 12-24       Progress: 20%  Client Response full compliance  Service Location Location, [address] [place], [place] [address] -virtual Service Code cpt [phone]  Emotion regulation skills  Related past to present  Rationally challenge thoughts or beliefs/cognitive restructuring  Identify/label emotions  Validate/empathize  Self care activities  Lifestyle change (exercise, nutrition)  Self-monitoring  Facilitate problem solving  Session notes:  f31.9 Bipolar Unspecified.  Goals: Mood stabilization, improved marital relationship, improve self esteem and elimination of self harm thoughts/tendencies. Also, patient struggles with leaving home comfortably and seeks to improve this ability. Target date for these goals is January, 2023. In addition, she needs to improve self care behaviors due to her weight and various medical conditions. Target date for this goal is Feb., 2022. Date revised to December, 2023 to allow time for behavioral implementation. Reports periodic outbursts of rage/anger. Seeks to minimize. Target date is December, 2023. Patient has realized improvement in all areas. Requests additional treatment to facilitate maintenance of progress. Goal date 12-24. Patient's husband died unexpectedly and she is suffering complicated grief. She needs additional counseling to address associated issues and manage feelings of loss, despair and anxiety. Goal date 12-24.  Meds: Vyralar (6mg ),Nuedexta (40+20mg /day), Adderall (20mg  4X/day), Xanax (1mg , 3X/day), and  Klonopin (1mg  3X/day).  Patient has requested a video session. She is at home and I am in my home office.  Carlinda says she  went to the doctor last Wednesday and was told she is "better but not healed", so she had to keep the drain. Did a CT scan to evaluate. She is slightly encouraged by this result. Mom's memorial is May 25th. Says her dad is renting a tent and he is writing a tribute to her. They expect up to 75 people to attend. Janus reports that she is physically feeling better than before. Mood is less depressed and reports improved self-care.                                                                                                                   [name]                                                                                                                                                                                        [name], PhD  Time:2:15p-3:00p 45 minutes.                           n     n

## 2022-06-06 DIAGNOSIS — F319 Bipolar disorder, unspecified: Secondary | ICD-10-CM | POA: Diagnosis not present

## 2022-06-06 DIAGNOSIS — G4733 Obstructive sleep apnea (adult) (pediatric): Secondary | ICD-10-CM | POA: Diagnosis not present

## 2022-06-06 DIAGNOSIS — I110 Hypertensive heart disease with heart failure: Secondary | ICD-10-CM | POA: Diagnosis not present

## 2022-06-06 DIAGNOSIS — I5042 Chronic combined systolic (congestive) and diastolic (congestive) heart failure: Secondary | ICD-10-CM | POA: Diagnosis not present

## 2022-06-06 DIAGNOSIS — J45909 Unspecified asthma, uncomplicated: Secondary | ICD-10-CM | POA: Diagnosis not present

## 2022-06-06 DIAGNOSIS — E1169 Type 2 diabetes mellitus with other specified complication: Secondary | ICD-10-CM | POA: Diagnosis not present

## 2022-06-06 DIAGNOSIS — F410 Panic disorder [episodic paroxysmal anxiety] without agoraphobia: Secondary | ICD-10-CM | POA: Diagnosis not present

## 2022-06-06 DIAGNOSIS — K651 Peritoneal abscess: Secondary | ICD-10-CM | POA: Diagnosis not present

## 2022-06-06 DIAGNOSIS — I272 Pulmonary hypertension, unspecified: Secondary | ICD-10-CM | POA: Diagnosis not present

## 2022-06-07 DIAGNOSIS — K651 Peritoneal abscess: Secondary | ICD-10-CM | POA: Diagnosis not present

## 2022-06-07 DIAGNOSIS — I272 Pulmonary hypertension, unspecified: Secondary | ICD-10-CM | POA: Diagnosis not present

## 2022-06-07 DIAGNOSIS — E1169 Type 2 diabetes mellitus with other specified complication: Secondary | ICD-10-CM | POA: Diagnosis not present

## 2022-06-07 DIAGNOSIS — G4733 Obstructive sleep apnea (adult) (pediatric): Secondary | ICD-10-CM | POA: Diagnosis not present

## 2022-06-07 DIAGNOSIS — I110 Hypertensive heart disease with heart failure: Secondary | ICD-10-CM | POA: Diagnosis not present

## 2022-06-07 DIAGNOSIS — F319 Bipolar disorder, unspecified: Secondary | ICD-10-CM | POA: Diagnosis not present

## 2022-06-07 DIAGNOSIS — I5042 Chronic combined systolic (congestive) and diastolic (congestive) heart failure: Secondary | ICD-10-CM | POA: Diagnosis not present

## 2022-06-07 DIAGNOSIS — F410 Panic disorder [episodic paroxysmal anxiety] without agoraphobia: Secondary | ICD-10-CM | POA: Diagnosis not present

## 2022-06-07 DIAGNOSIS — J45909 Unspecified asthma, uncomplicated: Secondary | ICD-10-CM | POA: Diagnosis not present

## 2022-06-11 ENCOUNTER — Other Ambulatory Visit: Payer: Medicare PPO

## 2022-06-11 DIAGNOSIS — E1169 Type 2 diabetes mellitus with other specified complication: Secondary | ICD-10-CM | POA: Diagnosis not present

## 2022-06-11 DIAGNOSIS — I5042 Chronic combined systolic (congestive) and diastolic (congestive) heart failure: Secondary | ICD-10-CM | POA: Diagnosis not present

## 2022-06-11 DIAGNOSIS — F319 Bipolar disorder, unspecified: Secondary | ICD-10-CM | POA: Diagnosis not present

## 2022-06-11 DIAGNOSIS — I110 Hypertensive heart disease with heart failure: Secondary | ICD-10-CM | POA: Diagnosis not present

## 2022-06-11 DIAGNOSIS — J45909 Unspecified asthma, uncomplicated: Secondary | ICD-10-CM | POA: Diagnosis not present

## 2022-06-11 DIAGNOSIS — K651 Peritoneal abscess: Secondary | ICD-10-CM | POA: Diagnosis not present

## 2022-06-11 DIAGNOSIS — G4733 Obstructive sleep apnea (adult) (pediatric): Secondary | ICD-10-CM | POA: Diagnosis not present

## 2022-06-11 DIAGNOSIS — F410 Panic disorder [episodic paroxysmal anxiety] without agoraphobia: Secondary | ICD-10-CM | POA: Diagnosis not present

## 2022-06-11 DIAGNOSIS — I272 Pulmonary hypertension, unspecified: Secondary | ICD-10-CM | POA: Diagnosis not present

## 2022-06-12 ENCOUNTER — Ambulatory Visit (INDEPENDENT_AMBULATORY_CARE_PROVIDER_SITE_OTHER): Payer: Medicare PPO | Admitting: Psychology

## 2022-06-12 DIAGNOSIS — F319 Bipolar disorder, unspecified: Secondary | ICD-10-CM

## 2022-06-12 NOTE — Progress Notes (Signed)
[name] is a 52 y.o. female patient   Treatment Plan: Date: 06/12/2022  Diagnosis 296.80 (Unspecified bipolar or related disorder) [n/a]  Symptoms Depressed or irritable mood. (Status: maintained) -- No Description Entered  Diminished interest in or enjoyment of activities. (Status: maintained) -- No Description Entered  Lack of energy. (Status: maintained) -- No Description Entered  Low self-esteem. (Status: maintained) -- No Description Entered  Medication Status compliance  Safety none  If Suicidal or Homicidal State Action Taken: unspecified  Current Risk: low Medications Adderall (Dosage: 20mg  4X/day)  Klonapin (Dosage: 1mg  3X/day)  Lamictal (Dosage: 200mg  2x/day)  Nuedexta (Dosage: 20mg )  Vyralar (Dosage: 6mg )  Xanax (Dosage: 1mg  3X/day)  Objectives Related Problem: Develop healthy cognitive patterns and beliefs about self and the world that lead to alleviation and help prevent the relapse of mood episodes. Description: State no longer having thoughts of self-harm. Target Date: 2023-01-08 Frequency: Daily Modality: individual Progress: 80%  Related Problem: Develop healthy cognitive patterns and beliefs about self and the world that lead to alleviation and help prevent the relapse of mood episodes. Description: Verbalize any history of past and present suicidal thoughts and actions. Target Date: 2023-01-08 Frequency: Daily Modality: individual Progress: 80%  Related Problem: Develop healthy cognitive patterns and beliefs about self and the world that lead to alleviation and help prevent the relapse of mood episodes. Description: Take prescribed medications as directed. Target Date: 2023-01-08 Frequency: Daily Modality: individual Progress: 90%  Related Problem: Develop healthy cognitive patterns and beliefs about self and the world that lead to alleviation and help prevent the relapse of mood episodes. Description: Discuss and resolve troubling  personal and interpersonal issues. Target Date: 2023-01-08 Frequency: Daily Modality: individual Progress: 80% Resolve complicated grief       Target Date: 12-24       Progress: 20%  Client Response full compliance  Service Location Location, [address] [place], [place] [address] -virtual Service Code cpt [phone]  Emotion regulation skills  Related past to present  Rationally challenge thoughts or beliefs/cognitive restructuring  Identify/label emotions  Validate/empathize  Self care activities  Lifestyle change (exercise, nutrition)  Self-monitoring  Facilitate problem solving  Session notes:  f31.9 Bipolar Unspecified.  Goals: Mood stabilization, improved marital relationship, improve self esteem and elimination of self harm thoughts/tendencies. Also, patient struggles with leaving home comfortably and seeks to improve this ability. Target date for these goals is January, 2023. In addition, she needs to improve self care behaviors due to her weight and various medical conditions. Target date for this goal is Feb., 2022. Date revised to December, 2023 to allow time for behavioral implementation. Reports periodic outbursts of rage/anger. Seeks to minimize. Target date is December, 2023. Patient has realized improvement in all areas. Requests additional treatment to facilitate maintenance of progress. Goal date 12-24. Patient's husband died unexpectedly and she is suffering complicated grief. She needs additional counseling to address associated issues and manage feelings of loss, despair and anxiety. Goal date 12-24.  Meds: Vyralar (6mg ),Nuedexta (40+20mg /day), Adderall (20mg  4X/day), Xanax (1mg , 3X/day), and Klonopin (1mg  3X/day).  Patient has requested a video session. She is at home and I am in my home office.  [name] says she is "just existing". She is mostly talking on the phone, watching tv or sleeping. She has very little motivation to engage in any activity. We talked  about her prior interest in art, but she doesn't think she has the drive or interest to get back to it. She had  difficulty talking today and shared very little. Denies it is her depression, but her affect was blunted.                                                                                                                     [name]                                                                                                                                                                                        [name], PhD  Time:2:15p-3:00p 45 minutes.                           n     n

## 2022-06-13 DIAGNOSIS — K5720 Diverticulitis of large intestine with perforation and abscess without bleeding: Secondary | ICD-10-CM | POA: Diagnosis not present

## 2022-06-13 DIAGNOSIS — K632 Fistula of intestine: Secondary | ICD-10-CM | POA: Diagnosis not present

## 2022-06-14 DIAGNOSIS — F319 Bipolar disorder, unspecified: Secondary | ICD-10-CM | POA: Diagnosis not present

## 2022-06-14 DIAGNOSIS — I110 Hypertensive heart disease with heart failure: Secondary | ICD-10-CM | POA: Diagnosis not present

## 2022-06-14 DIAGNOSIS — F410 Panic disorder [episodic paroxysmal anxiety] without agoraphobia: Secondary | ICD-10-CM | POA: Diagnosis not present

## 2022-06-14 DIAGNOSIS — K651 Peritoneal abscess: Secondary | ICD-10-CM | POA: Diagnosis not present

## 2022-06-14 DIAGNOSIS — G4733 Obstructive sleep apnea (adult) (pediatric): Secondary | ICD-10-CM | POA: Diagnosis not present

## 2022-06-14 DIAGNOSIS — E1169 Type 2 diabetes mellitus with other specified complication: Secondary | ICD-10-CM | POA: Diagnosis not present

## 2022-06-14 DIAGNOSIS — I272 Pulmonary hypertension, unspecified: Secondary | ICD-10-CM | POA: Diagnosis not present

## 2022-06-14 DIAGNOSIS — I5042 Chronic combined systolic (congestive) and diastolic (congestive) heart failure: Secondary | ICD-10-CM | POA: Diagnosis not present

## 2022-06-14 DIAGNOSIS — J45909 Unspecified asthma, uncomplicated: Secondary | ICD-10-CM | POA: Diagnosis not present

## 2022-06-19 ENCOUNTER — Ambulatory Visit (INDEPENDENT_AMBULATORY_CARE_PROVIDER_SITE_OTHER): Payer: Medicare PPO | Admitting: Psychology

## 2022-06-19 DIAGNOSIS — F319 Bipolar disorder, unspecified: Secondary | ICD-10-CM | POA: Diagnosis not present

## 2022-06-19 NOTE — Progress Notes (Signed)
[name] is a 52 y.o. female patient   Treatment Plan: Date: 06/19/2022  Diagnosis 296.80 (Unspecified bipolar or related disorder) [n/a]  Symptoms Depressed or irritable mood. (Status: maintained) -- No Description Entered  Diminished interest in or enjoyment of activities. (Status: maintained) -- No Description Entered  Lack of energy. (Status: maintained) -- No Description Entered  Low self-esteem. (Status: maintained) -- No Description Entered  Medication Status compliance  Safety none  If Suicidal or Homicidal State Action Taken: unspecified  Current Risk: low Medications Adderall (Dosage: 20mg  4X/day)  Klonapin (Dosage: 1mg  3X/day)  Lamictal (Dosage: 200mg  2x/day)  Nuedexta (Dosage: 20mg )  Vyralar (Dosage: 6mg )  Xanax (Dosage: 1mg  3X/day)  Objectives Related Problem: Develop healthy cognitive patterns and beliefs about self and the world that lead to alleviation and help prevent the relapse of mood episodes. Description: State no longer having thoughts of self-harm. Target Date: 2023-01-08 Frequency: Daily Modality: individual Progress: 80%  Related Problem: Develop healthy cognitive patterns and beliefs about self and the world that lead to alleviation and help prevent the relapse of mood episodes. Description: Verbalize any history of past and present suicidal thoughts and actions. Target Date: 2023-01-08 Frequency: Daily Modality: individual Progress: 80%  Related Problem: Develop healthy cognitive patterns and beliefs about self and the world that lead to alleviation and help prevent the relapse of mood episodes. Description: Take prescribed medications as directed. Target Date: 2023-01-08 Frequency: Daily Modality: individual Progress: 90%  Related Problem: Develop healthy cognitive patterns and beliefs about self and the world that lead to alleviation and help prevent the relapse of mood episodes. Description:  Discuss and resolve troubling personal and interpersonal issues. Target Date: 2023-01-08 Frequency: Daily Modality: individual Progress: 80% Resolve complicated grief       Target Date: 12-24       Progress: 20%  Client Response full compliance  Service Location Location, [address] [place], [place] [address] -virtual Service Code cpt [phone]  Emotion regulation skills  Related past to present  Rationally challenge thoughts or beliefs/cognitive restructuring  Identify/label emotions  Validate/empathize  Self care activities  Lifestyle change (exercise, nutrition)  Self-monitoring  Facilitate problem solving  Session notes:  f31.9 Bipolar Unspecified.  Goals: Mood stabilization, improved marital relationship, improve self esteem and elimination of self harm thoughts/tendencies. Also, patient struggles with leaving home comfortably and seeks to improve this ability. Target date for these goals is January, 2023. In addition, she needs to improve self care behaviors due to her weight and various medical conditions. Target date for this goal is Feb., 2022. Date revised to December, 2023 to allow time for behavioral implementation. Reports periodic outbursts of rage/anger. Seeks to minimize. Target date is December, 2023. Patient has realized improvement in all areas. Requests additional treatment to facilitate maintenance of progress. Goal date 12-24. Patient's husband died unexpectedly and she is suffering complicated grief. She needs additional counseling to address associated issues and manage feelings of loss, despair and anxiety. Goal date 12-24.  Meds: Vyralar (6mg ),Nuedexta (40+20mg /day), Adderall (20mg  4X/day), Xanax (1mg , 3X/day), and Klonopin (1mg  3X/day).  Patient has requested a video session. She is at home and I am in my home office.  Celia says she is waiting to hear from a GI doctor who will assess her situation after doing a colonoscopy. Her mother's Cloretta Ned is this  Saturday. She will go, but is not participating. There are out of  town family coming in and she has some cousins staying with her. Her cousin and his wife will be coming in from [place] tomorrow and stay with her for a week.  She is planning to get her Covid 19 booster today along with her shingles shot. She continues to be disengaged in sessions much of the time. Suggested she come to the office for face to face session and she agrees.                                                                                                                       [name]                                                                                                                                                                                        [name], PhD  Time:1:45p-2:30p 45 minutes.                           n     n

## 2022-06-21 DIAGNOSIS — G4733 Obstructive sleep apnea (adult) (pediatric): Secondary | ICD-10-CM | POA: Diagnosis not present

## 2022-06-21 DIAGNOSIS — F319 Bipolar disorder, unspecified: Secondary | ICD-10-CM | POA: Diagnosis not present

## 2022-06-21 DIAGNOSIS — J45909 Unspecified asthma, uncomplicated: Secondary | ICD-10-CM | POA: Diagnosis not present

## 2022-06-21 DIAGNOSIS — I272 Pulmonary hypertension, unspecified: Secondary | ICD-10-CM | POA: Diagnosis not present

## 2022-06-21 DIAGNOSIS — I110 Hypertensive heart disease with heart failure: Secondary | ICD-10-CM | POA: Diagnosis not present

## 2022-06-21 DIAGNOSIS — I5042 Chronic combined systolic (congestive) and diastolic (congestive) heart failure: Secondary | ICD-10-CM | POA: Diagnosis not present

## 2022-06-21 DIAGNOSIS — F410 Panic disorder [episodic paroxysmal anxiety] without agoraphobia: Secondary | ICD-10-CM | POA: Diagnosis not present

## 2022-06-21 DIAGNOSIS — K651 Peritoneal abscess: Secondary | ICD-10-CM | POA: Diagnosis not present

## 2022-06-21 DIAGNOSIS — E1169 Type 2 diabetes mellitus with other specified complication: Secondary | ICD-10-CM | POA: Diagnosis not present

## 2022-06-25 DIAGNOSIS — E1169 Type 2 diabetes mellitus with other specified complication: Secondary | ICD-10-CM | POA: Diagnosis not present

## 2022-06-25 DIAGNOSIS — K651 Peritoneal abscess: Secondary | ICD-10-CM | POA: Diagnosis not present

## 2022-06-25 DIAGNOSIS — I110 Hypertensive heart disease with heart failure: Secondary | ICD-10-CM | POA: Diagnosis not present

## 2022-06-25 DIAGNOSIS — I272 Pulmonary hypertension, unspecified: Secondary | ICD-10-CM | POA: Diagnosis not present

## 2022-06-25 DIAGNOSIS — F319 Bipolar disorder, unspecified: Secondary | ICD-10-CM | POA: Diagnosis not present

## 2022-06-25 DIAGNOSIS — J45909 Unspecified asthma, uncomplicated: Secondary | ICD-10-CM | POA: Diagnosis not present

## 2022-06-25 DIAGNOSIS — F410 Panic disorder [episodic paroxysmal anxiety] without agoraphobia: Secondary | ICD-10-CM | POA: Diagnosis not present

## 2022-06-25 DIAGNOSIS — G4733 Obstructive sleep apnea (adult) (pediatric): Secondary | ICD-10-CM | POA: Diagnosis not present

## 2022-06-25 DIAGNOSIS — I5042 Chronic combined systolic (congestive) and diastolic (congestive) heart failure: Secondary | ICD-10-CM | POA: Diagnosis not present

## 2022-06-26 ENCOUNTER — Ambulatory Visit: Payer: Medicare PPO | Admitting: Psychology

## 2022-06-29 DIAGNOSIS — J45909 Unspecified asthma, uncomplicated: Secondary | ICD-10-CM | POA: Diagnosis not present

## 2022-06-29 DIAGNOSIS — K651 Peritoneal abscess: Secondary | ICD-10-CM | POA: Diagnosis not present

## 2022-06-29 DIAGNOSIS — I110 Hypertensive heart disease with heart failure: Secondary | ICD-10-CM | POA: Diagnosis not present

## 2022-06-29 DIAGNOSIS — I272 Pulmonary hypertension, unspecified: Secondary | ICD-10-CM | POA: Diagnosis not present

## 2022-06-29 DIAGNOSIS — F319 Bipolar disorder, unspecified: Secondary | ICD-10-CM | POA: Diagnosis not present

## 2022-06-29 DIAGNOSIS — E1169 Type 2 diabetes mellitus with other specified complication: Secondary | ICD-10-CM | POA: Diagnosis not present

## 2022-06-29 DIAGNOSIS — F410 Panic disorder [episodic paroxysmal anxiety] without agoraphobia: Secondary | ICD-10-CM | POA: Diagnosis not present

## 2022-06-29 DIAGNOSIS — I5042 Chronic combined systolic (congestive) and diastolic (congestive) heart failure: Secondary | ICD-10-CM | POA: Diagnosis not present

## 2022-06-29 DIAGNOSIS — G4733 Obstructive sleep apnea (adult) (pediatric): Secondary | ICD-10-CM | POA: Diagnosis not present

## 2022-07-03 ENCOUNTER — Ambulatory Visit (INDEPENDENT_AMBULATORY_CARE_PROVIDER_SITE_OTHER): Payer: Medicare PPO | Admitting: Psychology

## 2022-07-03 DIAGNOSIS — F319 Bipolar disorder, unspecified: Secondary | ICD-10-CM | POA: Diagnosis not present

## 2022-07-03 NOTE — Progress Notes (Signed)
[name] is a 52 y.o. female patient   Treatment Plan: Date: 07/03/2022  Diagnosis 296.80 (Unspecified bipolar or related disorder) [n/a]  Symptoms Depressed or irritable mood. (Status: maintained) -- No Description Entered  Diminished interest in or enjoyment of activities. (Status: maintained) -- No Description Entered  Lack of energy. (Status: maintained) -- No Description Entered  Low self-esteem. (Status: maintained) -- No Description Entered  Medication Status compliance  Safety none  If Suicidal or Homicidal State Action Taken: unspecified  Current Risk: low Medications Adderall (Dosage: 20mg  4X/day)  Klonapin (Dosage: 1mg  3X/day)  Lamictal (Dosage: 200mg  2x/day)  Nuedexta (Dosage: 20mg )  Vyralar (Dosage: 6mg )  Xanax (Dosage: 1mg  3X/day)  Objectives Related Problem: Develop healthy cognitive patterns and beliefs about self and the world that lead to alleviation and help prevent the relapse of mood episodes. Description: State no longer having thoughts of self-harm. Target Date: 2023-01-08 Frequency: Daily Modality: individual Progress: 80%  Related Problem: Develop healthy cognitive patterns and beliefs about self and the world that lead to alleviation and help prevent the relapse of mood episodes. Description: Verbalize any history of past and present suicidal thoughts and actions. Target Date: 2023-01-08 Frequency: Daily Modality: individual Progress: 80%  Related Problem: Develop healthy cognitive patterns and beliefs about self and the world that lead to alleviation and help prevent the relapse of mood episodes. Description: Take prescribed medications as directed. Target Date: 2023-01-08 Frequency: Daily Modality: individual Progress: 90%  Related Problem: Develop healthy cognitive patterns and beliefs about self and the world that lead to alleviation and help prevent the relapse of mood episodes. Description:  Discuss and resolve troubling personal and interpersonal issues. Target Date: 2023-01-08 Frequency: Daily Modality: individual Progress: 80% Resolve complicated grief       Target Date: 12-24       Progress: 20%  Client Response full compliance  Service Location Location, [address] [place], [place] [address] -virtual Service Code cpt [phone]  Emotion regulation skills  Related past to present  Rationally challenge thoughts or beliefs/cognitive restructuring  Identify/label emotions  Validate/empathize  Self care activities  Lifestyle change (exercise, nutrition)  Self-monitoring  Facilitate problem solving  Session notes:  f31.9 Bipolar Unspecified.  Goals: Mood stabilization, improved marital relationship, improve self esteem and elimination of self harm thoughts/tendencies. Also, patient struggles with leaving home comfortably and seeks to improve this ability. Target date for these goals is January, 2023. In addition, she needs to improve self care behaviors due to her weight and various medical conditions. Target date for this goal is Feb., 2022. Date revised to December, 2023 to allow time for behavioral implementation. Reports periodic outbursts of rage/anger. Seeks to minimize. Target date is December, 2023. Patient has realized improvement in all areas. Requests additional treatment to facilitate maintenance of progress. Goal date 12-24. Patient's husband died unexpectedly and she is suffering complicated grief. She needs additional counseling to address associated issues and manage feelings of loss, despair and anxiety. Goal date 12-24.  Meds: Vyralar (6mg ),Nuedexta (40+20mg /day), Adderall (20mg  4X/day), Xanax (1mg , 3X/day), and Klonopin (1mg  3X/day).  Patient has requested a video session. She is at home and I am in my home office.  [name] says her [employer] was on the 25th. She sais it was "nice and there were a lot of people". Hard time getting her to  talk. I suggested that she come to the office for  the next session and she agrees. She claims that she does not have much going on and is relatively stable. She is not agitated and says she has been doing some excursions on her own. This represents some improvement with regard to her independence.                                                                                                                        [name]                                                                                                                                                                                        [name], PhD  Time:2:15p-3:00p 45 minutes.                           n     n               [name], PhD

## 2022-07-06 DIAGNOSIS — G4733 Obstructive sleep apnea (adult) (pediatric): Secondary | ICD-10-CM | POA: Diagnosis not present

## 2022-07-06 DIAGNOSIS — I272 Pulmonary hypertension, unspecified: Secondary | ICD-10-CM | POA: Diagnosis not present

## 2022-07-06 DIAGNOSIS — J45909 Unspecified asthma, uncomplicated: Secondary | ICD-10-CM | POA: Diagnosis not present

## 2022-07-06 DIAGNOSIS — F410 Panic disorder [episodic paroxysmal anxiety] without agoraphobia: Secondary | ICD-10-CM | POA: Diagnosis not present

## 2022-07-06 DIAGNOSIS — K651 Peritoneal abscess: Secondary | ICD-10-CM | POA: Diagnosis not present

## 2022-07-06 DIAGNOSIS — E1169 Type 2 diabetes mellitus with other specified complication: Secondary | ICD-10-CM | POA: Diagnosis not present

## 2022-07-06 DIAGNOSIS — F319 Bipolar disorder, unspecified: Secondary | ICD-10-CM | POA: Diagnosis not present

## 2022-07-06 DIAGNOSIS — I5042 Chronic combined systolic (congestive) and diastolic (congestive) heart failure: Secondary | ICD-10-CM | POA: Diagnosis not present

## 2022-07-06 DIAGNOSIS — I110 Hypertensive heart disease with heart failure: Secondary | ICD-10-CM | POA: Diagnosis not present

## 2022-07-10 ENCOUNTER — Ambulatory Visit (INDEPENDENT_AMBULATORY_CARE_PROVIDER_SITE_OTHER): Payer: Medicare PPO | Admitting: Psychology

## 2022-07-10 ENCOUNTER — Encounter: Payer: Self-pay | Admitting: Gastroenterology

## 2022-07-10 DIAGNOSIS — F319 Bipolar disorder, unspecified: Secondary | ICD-10-CM

## 2022-07-10 NOTE — Progress Notes (Signed)
[name] is a 52 y.o. female patient   Treatment Plan: Date: 07/10/2022  Diagnosis 296.80 (Unspecified bipolar or related disorder) [n/a]  Symptoms Depressed or irritable mood. (Status: maintained) -- No Description Entered  Diminished interest in or enjoyment of activities. (Status: maintained) -- No Description Entered  Lack of energy. (Status: maintained) -- No Description Entered  Low self-esteem. (Status: maintained) -- No Description Entered  Medication Status compliance  Safety none  If Suicidal or Homicidal State Action Taken: unspecified  Current Risk: low Medications Adderall (Dosage: 20mg  4X/day)  Klonapin (Dosage: 1mg  3X/day)  Lamictal (Dosage: 200mg  2x/day)  Nuedexta (Dosage: 20mg )  Vyralar (Dosage: 6mg )  Xanax (Dosage: 1mg  3X/day)  Objectives Related Problem: Develop healthy cognitive patterns and beliefs about self and the world that lead to alleviation and help prevent the relapse of mood episodes. Description: State no longer having thoughts of self-harm. Target Date: 2023-01-08 Frequency: Daily Modality: individual Progress: 80%  Related Problem: Develop healthy cognitive patterns and beliefs about self and the world that lead to alleviation and help prevent the relapse of mood episodes. Description: Verbalize any history of past and present suicidal thoughts and actions. Target Date: 2023-01-08 Frequency: Daily Modality: individual Progress: 80%  Related Problem: Develop healthy cognitive patterns and beliefs about self and the world that lead to alleviation and help prevent the relapse of mood episodes. Description: Take prescribed medications as directed. Target Date: 2023-01-08 Frequency: Daily Modality: individual Progress: 90%  Related Problem: Develop healthy cognitive patterns and beliefs about self and the world that lead to alleviation and help prevent the relapse of  mood episodes. Description: Discuss and resolve troubling personal and interpersonal issues. Target Date: 2023-01-08 Frequency: Daily Modality: individual Progress: 80% Resolve complicated grief       Target Date: 12-24       Progress: 20%  Client Response full compliance  Service Location Location, [address] [place], [place] [address] -virtual Service Code cpt [phone]  Emotion regulation skills  Related past to present  Rationally challenge thoughts or beliefs/cognitive restructuring  Identify/label emotions  Validate/empathize  Self care activities  Lifestyle change (exercise, nutrition)  Self-monitoring  Facilitate problem solving  Session notes:  f31.9 Bipolar Unspecified.  Goals: Mood stabilization, improved marital relationship, improve self esteem and elimination of self harm thoughts/tendencies. Also, patient struggles with leaving home comfortably and seeks to improve this ability. Target date for these goals is January, 2023. In addition, she needs to improve self care behaviors due to her weight and various medical conditions. Target date for this goal is Feb., 2022. Date revised to December, 2023 to allow time for behavioral implementation. Reports periodic outbursts of rage/anger. Seeks to minimize. Target date is December, 2023. Patient has realized improvement in all areas. Requests additional treatment to facilitate maintenance of progress. Goal date 12-24. Patient's husband died unexpectedly and she is suffering complicated grief. She needs additional counseling to address associated issues and manage feelings of loss, despair and anxiety. Goal date 12-24.  Meds: Vyralar (6mg ),Nuedexta (40+20mg /day), Adderall (20mg  4X/day), Xanax (1mg , 3X/day), and Klonopin (1mg  3X/day).  Patient has requested a video (Caregility) session. She is at home and I am in my home office.  [name] says that she is physicaly feeling a little better but is feeling emotionally down. She  says she has an appointment  with her financial advisor later today. She is finding it helpful. This is progress for [facility] in taking proactive steps to secure her future. She is also redoing her wardrobe to get clothes that fit her better. She is still losing weight, but in a slower, more controlled way than before. She is getting out of the house more often and socializing with some friends. This is another sign of some positive growth.                                                                                                                             [name], PhD  Time:2:10p-3:00p 50 minutes.                           n     n

## 2022-07-11 DIAGNOSIS — F410 Panic disorder [episodic paroxysmal anxiety] without agoraphobia: Secondary | ICD-10-CM | POA: Diagnosis not present

## 2022-07-11 DIAGNOSIS — I5042 Chronic combined systolic (congestive) and diastolic (congestive) heart failure: Secondary | ICD-10-CM | POA: Diagnosis not present

## 2022-07-11 DIAGNOSIS — I110 Hypertensive heart disease with heart failure: Secondary | ICD-10-CM | POA: Diagnosis not present

## 2022-07-11 DIAGNOSIS — G4733 Obstructive sleep apnea (adult) (pediatric): Secondary | ICD-10-CM | POA: Diagnosis not present

## 2022-07-11 DIAGNOSIS — J45909 Unspecified asthma, uncomplicated: Secondary | ICD-10-CM | POA: Diagnosis not present

## 2022-07-11 DIAGNOSIS — E1169 Type 2 diabetes mellitus with other specified complication: Secondary | ICD-10-CM | POA: Diagnosis not present

## 2022-07-11 DIAGNOSIS — F319 Bipolar disorder, unspecified: Secondary | ICD-10-CM | POA: Diagnosis not present

## 2022-07-11 DIAGNOSIS — K651 Peritoneal abscess: Secondary | ICD-10-CM | POA: Diagnosis not present

## 2022-07-11 DIAGNOSIS — I272 Pulmonary hypertension, unspecified: Secondary | ICD-10-CM | POA: Diagnosis not present

## 2022-07-14 ENCOUNTER — Other Ambulatory Visit: Payer: Self-pay | Admitting: Cardiology

## 2022-07-17 ENCOUNTER — Ambulatory Visit (INDEPENDENT_AMBULATORY_CARE_PROVIDER_SITE_OTHER): Payer: Medicare PPO | Admitting: Psychology

## 2022-07-17 DIAGNOSIS — F319 Bipolar disorder, unspecified: Secondary | ICD-10-CM | POA: Diagnosis not present

## 2022-07-17 NOTE — Progress Notes (Addendum)
[name] is a 52 y.o. female patient   Treatment Plan: Date: 07/17/2022  Diagnosis 296.80 (Unspecified bipolar or related disorder) [n/a]  Symptoms Depressed or irritable mood. (Status: maintained) -- No Description Entered  Diminished interest in or enjoyment of activities. (Status: maintained) -- No Description Entered  Lack of energy. (Status: maintained) -- No Description Entered  Low self-esteem. (Status: maintained) -- No Description Entered  Medication Status compliance  Safety none  If Suicidal or Homicidal State Action Taken: unspecified  Current Risk: low Medications Adderall (Dosage: 20mg  4X/day)  Klonapin (Dosage: 1mg  3X/day)  Lamictal (Dosage: 200mg  2x/day)  Nuedexta (Dosage: 20mg )  Vyralar (Dosage: 6mg )  Xanax (Dosage: 1mg  3X/day)  Objectives Related Problem: Develop healthy cognitive patterns and beliefs about self and the world that lead to alleviation and help prevent the relapse of mood episodes. Description: State no longer having thoughts of self-harm. Target Date: 2023-01-08 Frequency: Daily Modality: individual Progress: 80%  Related Problem: Develop healthy cognitive patterns and beliefs about self and the world that lead to alleviation and help prevent the relapse of mood episodes. Description: Verbalize any history of past and present suicidal thoughts and actions. Target Date: 2023-01-08 Frequency: Daily Modality: individual Progress: 80%  Related Problem: Develop healthy cognitive patterns and beliefs about self and the world that lead to alleviation and help prevent the relapse of mood episodes. Description: Take prescribed medications as directed. Target Date: 2023-01-08 Frequency: Daily Modality: individual Progress: 90%  Related Problem: Develop healthy cognitive patterns and beliefs about self and the world that lead to alleviation and  help prevent the relapse of mood episodes. Description: Discuss and resolve troubling personal and interpersonal issues. Target Date: 2023-01-08 Frequency: Daily Modality: individual Progress: 80% Resolve complicated grief       Target Date: 12-24       Progress: 20%  Client Response full compliance  Service Location Location, [address] [place], [place] [address] -virtual Service Code cpt [phone]  Emotion regulation skills  Related past to present  Rationally challenge thoughts or beliefs/cognitive restructuring  Identify/label emotions  Validate/empathize  Self care activities  Lifestyle change (exercise, nutrition)  Self-monitoring  Facilitate problem solving  Session notes:  f31.9 Bipolar Unspecified.  Goals: Mood stabilization, improved marital relationship, improve self esteem and elimination of self harm thoughts/tendencies. Also, patient struggles with leaving home comfortably and seeks to improve this ability. Target date for these goals is January, 2023. In addition, she needs to improve self care behaviors due to her weight and various medical conditions. Target date for this goal is Feb., 2022. Date revised to December, 2023 to allow time for behavioral implementation. Reports periodic outbursts of rage/anger. Seeks to minimize. Target date is December, 2023. Patient has realized improvement in all areas. Requests additional treatment to facilitate maintenance of progress. Goal date 12-24. Patient's husband died unexpectedly and she is suffering complicated grief. She needs additional counseling to address associated issues and manage feelings of loss, despair and anxiety. Goal date 12-24.  Meds: Vyralar (6mg ),Nuedexta (40+20mg /day), Adderall (20mg  4X/day), Xanax (1mg , 3X/day), and Klonopin (1mg  3X/day).  Patient has requested a video Public affairs consultant) session and understands the limits of this platform. She is at home and I am in my home office.  [name] says that she  visited with her father on Father's Day. She stayed about 3 hours and had dinner with the family. Asked about her grief process and she reports that she rarely talks about [name] to others. She is not sharing much information. Says she has "little to talk about". Does admit to having some depressive feelings, but not to the degree of feeling suicidal. She is doing a better job at self-care and is eating better than before. She needs to have some activity or daily goals. She does not report that she has any goals, projects or interests to keep her busy during the day. Agrees to start exploring options that will interest her moving forward.                                                                                                                              [name]                                                                                                                                                                                        [name], PhD  Time:2:15p-3:00p 45 minutes.                           n     n

## 2022-07-19 DIAGNOSIS — G4733 Obstructive sleep apnea (adult) (pediatric): Secondary | ICD-10-CM | POA: Diagnosis not present

## 2022-07-19 DIAGNOSIS — I272 Pulmonary hypertension, unspecified: Secondary | ICD-10-CM | POA: Diagnosis not present

## 2022-07-19 DIAGNOSIS — F410 Panic disorder [episodic paroxysmal anxiety] without agoraphobia: Secondary | ICD-10-CM | POA: Diagnosis not present

## 2022-07-19 DIAGNOSIS — E1169 Type 2 diabetes mellitus with other specified complication: Secondary | ICD-10-CM | POA: Diagnosis not present

## 2022-07-19 DIAGNOSIS — I110 Hypertensive heart disease with heart failure: Secondary | ICD-10-CM | POA: Diagnosis not present

## 2022-07-19 DIAGNOSIS — F319 Bipolar disorder, unspecified: Secondary | ICD-10-CM | POA: Diagnosis not present

## 2022-07-19 DIAGNOSIS — K651 Peritoneal abscess: Secondary | ICD-10-CM | POA: Diagnosis not present

## 2022-07-19 DIAGNOSIS — J45909 Unspecified asthma, uncomplicated: Secondary | ICD-10-CM | POA: Diagnosis not present

## 2022-07-19 DIAGNOSIS — I5042 Chronic combined systolic (congestive) and diastolic (congestive) heart failure: Secondary | ICD-10-CM | POA: Diagnosis not present

## 2022-07-24 ENCOUNTER — Ambulatory Visit (INDEPENDENT_AMBULATORY_CARE_PROVIDER_SITE_OTHER): Payer: Medicare PPO | Admitting: Psychology

## 2022-07-24 DIAGNOSIS — F319 Bipolar disorder, unspecified: Secondary | ICD-10-CM | POA: Diagnosis not present

## 2022-07-24 NOTE — Progress Notes (Signed)
LILYANNE MCQUOWN is a 52 y.o. female patient   Treatment Plan: Date: 07/24/2022  Diagnosis 296.80 (Unspecified bipolar or related disorder) [n/a]  Symptoms Depressed or irritable mood. (Status: maintained) -- No Description Entered  Diminished interest in or enjoyment of activities. (Status: maintained) -- No Description Entered  Lack of energy. (Status: maintained) -- No Description Entered  Low self-esteem. (Status: maintained) -- No Description Entered  Medication Status compliance  Safety none  If Suicidal or Homicidal State Action Taken: unspecified  Current Risk: low Medications Adderall (Dosage: 20mg  4X/day)  Klonapin (Dosage: 1mg  3X/day)  Lamictal (Dosage: 200mg  2x/day)  Nuedexta (Dosage: 20mg )  Vyralar (Dosage: 6mg )  Xanax (Dosage: 1mg  3X/day)  Objectives Related Problem: Develop healthy cognitive patterns and beliefs about self and the world that lead to alleviation and help prevent the relapse of mood episodes. Description: State no longer having thoughts of self-harm. Target Date: 2023-01-08 Frequency: Daily Modality: individual Progress: 80%  Related Problem: Develop healthy cognitive patterns and beliefs about self and the world that lead to alleviation and help prevent the relapse of mood episodes. Description: Verbalize any history of past and present suicidal thoughts and actions. Target Date: 2023-01-08 Frequency: Daily Modality: individual Progress: 80%  Related Problem: Develop healthy cognitive patterns and beliefs about self and the world that lead to alleviation and help prevent the relapse of mood episodes. Description: Take prescribed medications as directed. Target Date: 2023-01-08 Frequency: Daily Modality: individual Progress: 90%  Related Problem: Develop healthy cognitive patterns and beliefs about self and the world  that lead to alleviation and help prevent the relapse of mood episodes. Description: Discuss and resolve troubling personal and interpersonal issues. Target Date: 2023-01-08 Frequency: Daily Modality: individual Progress: 80% Resolve complicated grief       Target Date: 12-24       Progress: 20%  Client Response full compliance  Service Location Location, [address] [place], [place] [address] -virtual Service Code cpt [phone]  Emotion regulation skills  Related past to present  Rationally challenge thoughts or beliefs/cognitive restructuring  Identify/label emotions  Validate/empathize  Self care activities  Lifestyle change (exercise, nutrition)  Self-monitoring  Facilitate problem solving  Session notes:  f31.9 Bipolar Unspecified.  Goals: Mood stabilization, improved marital relationship, improve self esteem and elimination of self harm thoughts/tendencies. Also, patient struggles with leaving home comfortably and seeks to improve this ability. Target date for these goals is January, 2023. In addition, she needs to improve self care behaviors due to her weight and various medical conditions. Target date for this goal is Feb., 2022. Date revised to December, 2023 to allow time for behavioral implementation. Reports periodic outbursts of rage/anger. Seeks to minimize. Target date is December, 2023. Patient has realized improvement in all areas. Requests additional treatment to facilitate maintenance of progress. Goal date 12-24. Patient's husband died unexpectedly and she is suffering complicated grief. She needs additional counseling to address associated issues and manage feelings of loss, despair and anxiety. Goal date 12-24.  Meds: Vyralar (6mg ),Nuedexta (40+20mg /day), Adderall (20mg  4X/day), Xanax (1mg , 3X/day), and Klonopin (1mg  3X/day).  Patient has requested a video Public affairs consultant) session and understands the limits  of this platform. She is at home and I am in my home  office.  River says she has been feeling better physically. Goes to doctor on July 12 to find out the next medical steps. She has started a dialogue with an old friend from high school. He contacted Briley on social media and text. They have talked on the phone as well. He is on disability due to orthopedic issues and lives with mother. She says she barely remembered him. This is a positive step for Jaimey to be engaging in social relationships. His name is TC Alinda Money) and she is hoping to meet up with him for lunch or dinner sometime.                                                                                                                                   [name]                                                                                                                                                                                        [name], PhD  Time:2:15p-3:00p 45 minutes.                           n     n

## 2022-07-31 ENCOUNTER — Ambulatory Visit: Payer: Medicare PPO | Admitting: Psychology

## 2022-08-07 ENCOUNTER — Ambulatory Visit (INDEPENDENT_AMBULATORY_CARE_PROVIDER_SITE_OTHER): Payer: Medicare PPO | Admitting: Psychology

## 2022-08-07 DIAGNOSIS — F319 Bipolar disorder, unspecified: Secondary | ICD-10-CM | POA: Diagnosis not present

## 2022-08-07 NOTE — Progress Notes (Signed)
[name] is a 52 y.o. female patient   Treatment Plan: Date: 08/07/2022  Diagnosis 296.80 (Unspecified bipolar or related disorder) [n/a]  Symptoms Depressed or irritable mood. (Status: maintained) -- No Description Entered  Diminished interest in or enjoyment of activities. (Status: maintained) -- No Description Entered  Lack of energy. (Status: maintained) -- No Description Entered  Low self-esteem. (Status: maintained) -- No Description Entered  Medication Status compliance  Safety none  If Suicidal or Homicidal State Action Taken: unspecified  Current Risk: low Medications Adderall (Dosage: 20mg  4X/day)  Klonapin (Dosage: 1mg  3X/day)  Lamictal (Dosage: 200mg  2x/day)  Nuedexta (Dosage: 20mg )  Vyralar (Dosage: 6mg )  Xanax (Dosage: 1mg  3X/day)  Objectives Related Problem: Develop healthy cognitive patterns and beliefs about self and the world that lead to alleviation and help prevent the relapse of mood episodes. Description: State no longer having thoughts of self-harm. Target Date: 2023-01-08 Frequency: Daily Modality: individual Progress: 80%  Related Problem: Develop healthy cognitive patterns and beliefs about self and the world that lead to alleviation and help prevent the relapse of mood episodes. Description: Verbalize any history of past and present suicidal thoughts and actions. Target Date: 2023-01-08 Frequency: Daily Modality: individual Progress: 80%  Related Problem: Develop healthy cognitive patterns and beliefs about self and the world that lead to alleviation and help prevent the relapse of mood episodes. Description: Take prescribed medications as directed. Target Date: 2023-01-08 Frequency: Daily Modality: individual Progress: 90%  Related Problem: Develop healthy cognitive patterns and beliefs about self and the world that lead to alleviation and help prevent the relapse of mood episodes. Description: Discuss and resolve troubling  personal and interpersonal issues. Target Date: 2023-01-08 Frequency: Daily Modality: individual Progress: 80% Resolve complicated grief       Target Date: 12-24       Progress: 20%  Client Response full compliance  Service Location Location, [address] [place], [place] [address] -virtual Service Code cpt [phone]  Emotion regulation skills  Related past to present  Rationally challenge thoughts or beliefs/cognitive restructuring  Identify/label emotions  Validate/empathize  Self care activities  Lifestyle change (exercise, nutrition)  Self-monitoring  Facilitate problem solving  Session notes:  f31.9 Bipolar Unspecified.  Goals: Mood stabilization, improved marital relationship, improve self esteem and elimination of self harm thoughts/tendencies. Also, patient struggles with leaving home comfortably and seeks to improve this ability. Target date for these goals is January, 2023. In addition, she needs to improve self care behaviors due to her weight and various medical conditions. Target date for this goal is Feb., 2022. Date revised to December, 2023 to allow time for behavioral implementation. Reports periodic outbursts of rage/anger. Seeks to minimize. Target date is December, 2023. Patient has realized improvement in all areas. Requests additional treatment to facilitate maintenance of progress. Goal date 12-24. Patient's husband died unexpectedly and she is suffering complicated grief. She needs additional counseling to address associated issues and manage feelings of loss, despair and anxiety. Goal date 12-24.  Meds: Vyralar (6mg ),Nuedexta (40+20mg /day), Adderall (20mg  4X/day), Xanax (1mg , 3X/day), and Klonopin (1mg  3X/day).  Patient has requested a video Public affairs consultant) session and understands the limits of this platform. She is at home and I am in my home office.  [name] is not interacting and hardly responsive to questions. Not hostile, but withholding. Says she is tired  and not feeling like talking. She says she is tired of being by herself. Claims it was okay for a while but is now tired of  it. She also says that she has far fewer people coming to see her and calling her. We talked about her taking more initiative to get out and be around people. She does say she went out to lunch with someone she knew from high school. They had not seen each other since high school. "It was a nice time" and they are still communicating on-line. She also has attended a July 4th gathering with a friend. Reinforced the importance of getting out more to help combat the loneliness. Talked about her future interests but she struggles articulating goals. States that she has a GI appointment with a new GI doc and is hoping to hear that she doesn't need additional surgery.                                                                                                                                     [name]                                                                                                                                                                                        [name], PhD  Time:2:15p-3:00p 45 minutes.                           n     n

## 2022-08-08 ENCOUNTER — Encounter: Payer: Self-pay | Admitting: Gastroenterology

## 2022-08-08 ENCOUNTER — Ambulatory Visit (INDEPENDENT_AMBULATORY_CARE_PROVIDER_SITE_OTHER): Payer: Medicare PPO | Admitting: Gastroenterology

## 2022-08-08 VITALS — BP 130/88 | HR 65 | Ht 65.0 in | Wt 255.0 lb

## 2022-08-08 DIAGNOSIS — K5720 Diverticulitis of large intestine with perforation and abscess without bleeding: Secondary | ICD-10-CM | POA: Diagnosis not present

## 2022-08-08 MED ORDER — SUTAB 1479-225-188 MG PO TABS: 24.0000 | ORAL_TABLET | Freq: Once | ORAL | 0 refills | Status: AC

## 2022-08-08 NOTE — Progress Notes (Signed)
Chief Complaint: colonoscopy s/p complicated diverticulitis Primary GI MD: Gentry Fitz  HPI:  52 year old female with medical history of CHF, OSA, obesity (BMI 44), complicated diverticulitis, presents to discuss pre-op colonoscopy.  Patient has history of perforated diverticulitis November 2023 which required multiple drains. She then presented to ED 05/10/22 with worsening abdominal pain and found to have diverticulitis with abscess.  CT abdomen pelvis showed midline lower abdominal gas containing fluid collection measuring 7.5 x 4.3.  This was previously 2.2 x 2.2.  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  There is also another 6.8 x 3.2 fluid collection that communicates posteriorly and extends into the rectouterine pouch.  There is also a gas containing fluid collection in the abdominal musculature measuring 2.8 x 2.0 cm.   Drain was placed and patient was discharged on bactrim and flagyl. patient followed up with general surgery. Per cardiology inpatient, she is a mild to moderate risk for cardiovascular complications for abdominal surgeries.  Dr. Dossie Der recommended elective sigmoid colectomy but she is here to schedule preop colonoscopy.   Patient reports colonoscopy in 1996 for for IBS and she doesn't think it was conclusive.  Reports normal bowel movements. No GI issues today.  PREVIOUS GI WORKUP   - colonoscopy 1996  Past Medical History:  Diagnosis Date   Acute systolic congestive heart failure (HCC)    Agoraphobia    Anxiety    panic attacks   Arthritis    osteoarthritis bilal. knees   Asthma    no per PFT 7/13; reports does not have asthma   Bipolar disorder (HCC)    Carpal tunnel syndrome of left wrist 05/2011   Being evaluated for MS   Cor pulmonale (HCC)    Depression    Depression    History of pleural effusion    Hyperlipidemia    Hypertension    Hypoxemia    history of - no home O2   IBS (irritable bowel syndrome)     Morbid obesity (HCC)    Obesity hypoventilation syndrome (HCC)    Perforation of sigmoid colon due to diverticulitis 12/13/2021   Pneumonia    Pre-diabetes    Prediabetes    Seasonal allergies    current runny nose   Seizures (HCC)    febrile seizure x 1 as a child. Several times   Sleep apnea sleep study 07/02/2010   uses CPAP nightly   Spinal stenosis in cervical region    Urinary incontinence     Past Surgical History:  Procedure Laterality Date   APPLICATION OF INTRAOPERATIVE CT SCAN N/A 05/14/2019   Procedure: APPLICATION OF INTRAOPERATIVE CT SCAN;  Surgeon: Maeola Harman, MD;  Location: [facility];  Service: Neurosurgery;  Laterality: N/A;   CARPAL TUNNEL RELEASE  06/27/2011   Procedure: CARPAL TUNNEL RELEASE;  Surgeon: Nicki Reaper, MD;  Location: [facility];  Service: Orthopedics;  Laterality: Left;   CARPAL TUNNEL RELEASE  12/19/2011   Procedure: CARPAL TUNNEL RELEASE;  Surgeon: Nicki Reaper, MD;  Location: [facility];  Service: Orthopedics;  Laterality: Right;   INCISION AND DRAINAGE ABSCESS Left 08/27/2017   Procedure: INCISION AND DRAINAGE LEFT BUTTOCK  ABSCESS;  Surgeon: Glenna Fellows, MD;  Location: [facility];  Service: General;  Laterality: Left;   IR RADIOLOGIST EVAL & MGMT  03/16/2022   IR RADIOLOGIST EVAL & MGMT  04/18/2022   IR SINUS/FIST TUBE CHK-NON GI  01/03/2022   IR SINUS/FIST TUBE CHK-NON GI  01/03/2022   IR SINUS/FIST TUBE CHK-NON GI  01/03/2022   IR SINUS/FIST TUBE CHK-NON GI  01/03/2022   IRRIGATION AND DEBRIDEMENT BUTTOCKS Left 08/30/2017   Procedure: IRRIGATION AND DEBRIDEMENT RE-EXCISION OF LEFT SUBCUTANEOUS BUTTOCKS ABCESS;  Surgeon: Sheliah Hatch De Blanch, MD;  Location: [facility];  Service: General;  Laterality: Left;   LAPAROSCOPY N/A 12/11/2021   Procedure: LAPAROSCOPIC PERITONEAL LAVAGE WITH DRAIN PLACEMENT;  Surgeon: Quentin Ore, MD;  Location: [facility];  Service: General;  Laterality: N/A;   MASS EXCISION  08/07/2010   right  index   POSTERIOR CERVICAL FUSION/FORAMINOTOMY  09/11/2011   Procedure: POSTERIOR CERVICAL FUSION/FORAMINOTOMY LEVEL 1;  Surgeon: Maeola Harman, MD;  Location: [facility];  Service: Neurosurgery;  Laterality: N/A;  Cervical Three-Four Posteior Cervical Fusion and Decompression.   POSTERIOR CERVICAL FUSION/FORAMINOTOMY N/A 05/14/2019   Procedure: Posterior cervical decompression/fusion Cervical four to Thoracic one with exploration/revision of Cervical threee-four fusion;  Surgeon: Maeola Harman, MD;  Location: [facility];  Service: Neurosurgery;  Laterality: N/A;   TRIGGER FINGER RELEASE  12/19/2011   Procedure: RELEASE TRIGGER FINGER/A-1 PULLEY;  Surgeon: Nicki Reaper, MD;  Location: [facility];  Service: Orthopedics;  Laterality: Right;   TRIGGER FINGER RELEASE Left 12/31/2012   Procedure: RELEASE A-1 PULLEY LEFT THUMB;  Surgeon: Nicki Reaper, MD;  Location: [facility];  Service: Orthopedics;  Laterality: Left;   WISDOM TOOTH EXTRACTION      Current Outpatient Medications  Medication Sig Dispense Refill   acetaminophen (TYLENOL) 325 MG tablet Take 2 tablets (650 mg total) by mouth every 6 (six) hours as needed for mild pain (or Fever >/= 101).     albuterol (VENTOLIN HFA) 108 (90 Base) MCG/ACT inhaler Inhale 2 puffs into the lungs every 6 (six) hours as needed for wheezing or shortness of breath. 20.1 g 0   ALPRAZolam (XANAX XR) 1 MG 24 hr tablet Take 1 mg by mouth in the morning and at bedtime. Taking twice daily along with 1mg  tablet     ALPRAZolam (XANAX) 1 MG tablet Take 1 mg by mouth 2 (two) times daily.     amphetamine-dextroamphetamine (ADDERALL) 20 MG tablet Take 20 mg by mouth in the morning, at noon, in the evening, and at bedtime.      Ascorbic Acid (VITAMIN C) 500 MG tablet Take 1,000 mg by mouth daily.     aspirin 81 MG tablet Take 1 tablet (81 mg total) by mouth daily. 30 tablet    atorvastatin (LIPITOR) 20 MG tablet TAKE 1 TABLET BY MOUTH EVERY DAY IN THE  EVENING (Patient taking differently: Take 20 mg by mouth daily.) 90 tablet 4   b complex vitamins tablet Take 1 tablet by mouth daily.     Calcium Citrate (CITRACAL PO) Take 2 tablets by mouth daily.     Cholecalciferol (VITAMIN D3) 5000 UNITS TABS Take 10,000 Units by mouth in the morning and at bedtime.     Chromium Picolinate 500 MCG TABS Take 500 mcg by mouth daily.      diclofenac Sodium (VOLTAREN) 1 % GEL Apply 2 g topically 3 (three) times daily as needed (knee pain).     furosemide (LASIX) 40 MG tablet Take 2 tablets (80 mg total) by mouth 2 (two) times daily. (Patient taking differently: Take 40 mg by mouth 2 (two) times daily.) 360 tablet 3   levalbuterol (XOPENEX HFA) 45 MCG/ACT inhaler Inhale 2 puffs into the lungs every 6 (six) hours as needed for wheezing.  45 g 3   lidocaine (LIDODERM) 5 % PLACE 1 PATCH ONTO THE SKIN DAILY AS NEEDED FOR KNEE PAIN. REMOVE AND DISCARD PATCH WITHIN 12 HOURS OR AS DIRECTED BY DOCTOR. (Patient taking differently: Place 1 patch onto the skin as needed (knee pain).) 30 patch 0   losartan (COZAAR) 25 MG tablet TAKE 1 TABLET TWICE DAILY 180 tablet 3   LYRICA 300 MG capsule Take 300 mg by mouth 2 (two) times daily.      MAGNESIUM PO Take 500 mg by mouth daily.      Multiple Vitamin (MULTIVITAMIN) tablet Take 1 tablet by mouth daily.     NUEDEXTA 20-10 MG CAPS Take 1 capsule by mouth every 12 (twelve) hours.   12   Omega-3 Fatty Acids (FISH OIL) 1200 MG CAPS Take 2,400 mg by mouth daily.      potassium chloride SA (KLOR-CON M20) 20 MEQ tablet Take 1 tablet (20 mEq total) by mouth daily. 90 tablet 4   Sodium Chloride Flush (NORMAL SALINE FLUSH) 0.9 % SOLN Use to flush drain 1-2 times daily. 600 mL 1   VRAYLAR 6 MG CAPS Take 6 mg by mouth every evening.      Zinc 50 MG CAPS Take 50 mg by mouth in the morning and at bedtime.     Blood Glucose Monitoring Suppl (ACCU-CHEK AVIVA PLUS) w/Device KIT As directed. (Patient not taking: Reported on 08/08/2022) 1 kit 0    fluticasone (FLONASE) 50 MCG/ACT nasal spray Place 2 sprays into both nostrils daily for 7 days. 16 g 0   loratadine (CLARITIN) 10 MG tablet Take 1 tablet (10 mg total) by mouth daily for 10 days. 10 tablet 0   No current facility-administered medications for this visit.    Allergies as of 08/08/2022 - Review Complete 08/08/2022  Allergen Reaction Noted   Savella [milnacipran hcl] Other (See Comments) 11/14/2012    Family History  Problem Relation Age of Onset   Asthma Mother    Allergic rhinitis Mother    Multiple sclerosis Mother    Asthma Maternal Grandmother    Breast cancer Neg Hx     Social History   Socioeconomic History   Marital status: Widowed    Spouse name: Not on file   Number of children: 0   Years of education: Not on file   Highest education level: Bachelor's degree (e.g., BA, AB, BS)  Occupational History   Occupation: currently unemployed  Tobacco Use   Smoking status: Never   Smokeless tobacco: Never  Vaping Use   Vaping Use: Never used  Substance and Sexual Activity   Alcohol use: Yes    Comment: rare   Drug use: No   Sexual activity: Not Currently  Other Topics Concern   Not on file  Social History Narrative   Lives in [place]         Social Determinants of Health   Financial Resource Strain: Low Risk  (04/02/2022)   Overall Financial Resource Strain (CARDIA)    Difficulty of Paying Living Expenses: Not hard at all  Food Insecurity: No Food Insecurity (05/16/2022)   Hunger Vital Sign    Worried About Running Out of Food in the Last Year: Never true    Ran Out of Food in the Last Year: Never true  Transportation Needs: Unmet Transportation Needs (05/16/2022)   PRAPARE - Administrator, Civil Service (Medical): Yes    Lack of Transportation (Non-Medical): No  Physical Activity: Insufficiently Active (04/02/2022)  Exercise Vital Sign    Days of Exercise per Week: 2 days    Minutes of Exercise per Session: 30 min  Stress: No  Stress Concern Present (04/02/2022)   [employer] of Occupational Health - Occupational Stress Questionnaire    Feeling of Stress : Not at all  Social Connections: Moderately Integrated (04/02/2022)   Social Connection and Isolation Panel [NHANES]    Frequency of Communication with Friends and Family: More than three times a week    Frequency of Social Gatherings with Friends and Family: More than three times a week    Attends Religious Services: More than 4 times per year    Active Member of [employer] or Organizations: Yes    Attends Banker Meetings: More than 4 times per year    Marital Status: Widowed  Intimate Partner Violence: Not At Risk (05/10/2022)   Humiliation, Afraid, Rape, and Kick questionnaire    Fear of Current or Ex-Partner: No    Emotionally Abused: No    Physically Abused: No    Sexually Abused: No    Review of Systems:    Constitutional: No weight loss, fever, chills, weakness or fatigue HEENT: Eyes: No change in vision               Ears, Nose, Throat:  No change in hearing or congestion Skin: No rash or itching Cardiovascular: No chest pain, chest pressure or palpitations   Respiratory: No SOB or cough Gastrointestinal: See HPI and otherwise negative Genitourinary: No dysuria or change in urinary frequency Neurological: No headache, dizziness or syncope Musculoskeletal: No new muscle or joint pain Hematologic: No bleeding or bruising Psychiatric: No history of depression or anxiety    Physical Exam:  Vital signs: BP 130/88   Pulse 65   Ht 5\' 5"  (1.651 m)   Wt 115.7 kg   BMI 42.43 kg/m   Constitutional: NAD, Well developed, Well nourished, in wheelchair Head:  Normocephalic and atraumatic. Eyes:   PEERL, EOMI. No icterus. Conjunctiva pink. Respiratory: Respirations even and unlabored. Lungs clear to auscultation bilaterally.   No wheezes, crackles, or rhonchi.  Cardiovascular:  Regular rate and rhythm. No peripheral edema, cyanosis or  pallor.  Gastrointestinal:  Soft, nondistended, nontender. No rebound or guarding. Normal bowel sounds. No appreciable masses or hepatomegaly. Drain placement noted, no fluid. Rectal:  Not performed.  Msk:  Symmetrical without gross deformities. Without edema, no deformity or joint abnormality.  Neurologic:  Alert and  oriented x4;  grossly normal neurologically.  Skin:   Dry and intact without significant lesions or rashes. Psychiatric: Oriented to person, place and time. Demonstrates good judgement and reason without abnormal affect or behaviors.   RELEVANT LABS AND IMAGING: CBC    Component Value Date/Time   WBC 4.6 05/23/2022 1441   RBC 4.66 05/23/2022 1441   HGB 13.6 05/23/2022 1441   HCT 41.4 05/23/2022 1441   PLT 251.0 05/23/2022 1441   MCV 89.0 05/23/2022 1441   MCH 28.2 05/15/2022 0455   MCHC 32.8 05/23/2022 1441   RDW 16.7 (H) 05/23/2022 1441   LYMPHSABS 1.0 05/15/2022 0455   MONOABS 0.2 05/15/2022 0455   EOSABS 0.3 05/15/2022 0455   BASOSABS 0.1 05/15/2022 0455    CMP     Component Value Date/Time   NA 138 05/23/2022 1441   K 4.8 hemolyzed sample 05/23/2022 1441   CL 104 05/23/2022 1441   CO2 25 05/23/2022 1441   GLUCOSE 157 (H) 05/23/2022 1441   BUN 11 05/23/2022  1441   CREATININE 0.87 05/23/2022 1441   CALCIUM 9.1 05/23/2022 1441   PROT 6.0 (L) 05/12/2022 0257   ALBUMIN 2.2 (L) 05/12/2022 0257   AST 19 05/12/2022 0257   ALT 22 05/12/2022 0257   ALKPHOS 74 05/12/2022 0257   BILITOT 0.9 05/12/2022 0257   GFRNONAA >60 05/15/2022 0455   GFRAA >60 05/11/2019 1411    Assessment: 1. 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  Plan: 1. Colonoscopy 2. I thoroughly discussed the procedure with the patient (at bedside) to include nature of the procedure, alternatives, benefits, and risks (including but not limited to bleeding, infection, perforation,  anesthesia/cardiac pulmonary complications).  Patient verbalized understanding and gave verbal consent to proceed with EGD/Colonoscopy.    Lara Mulch [facility] 08/08/2022, 2:25 PM  Cc: [place], Betty G, MD

## 2022-08-08 NOTE — Patient Instructions (Signed)
_______________________________________________________  If your blood pressure at your visit was 140/90 or greater, please contact your primary care physician to follow up on this.  _______________________________________________________  If you are age 53 or older, your body mass index should be between 23-30. Your Body mass index is 42.43 kg/m. If this is out of the aforementioned range listed, please consider follow up with your Primary Care Provider.  If you are age 51 or younger, your body mass index should be between 19-25. Your Body mass index is 42.43 kg/m. If this is out of the aformentioned range listed, please consider follow up with your Primary Care Provider.   You have been scheduled for a colonoscopy. Please follow written instructions given to you at your visit today.   Please pick up your prep supplies at the pharmacy within the next 1-3 days.  If you use inhalers (even only as needed), please bring them with you on the day of your procedure.  DO NOT TAKE 7 DAYS PRIOR TO TEST- Trulicity (dulaglutide) Ozempic, Wegovy (semaglutide) Mounjaro (tirzepatide) Bydureon Bcise (exanatide extended release)  DO NOT TAKE 1 DAY PRIOR TO YOUR TEST Rybelsus (semaglutide) Adlyxin (lixisenatide) Victoza (liraglutide) Byetta (exanatide) ___________________________________________________________________________   ________________________________________________________  The  GI providers would like to encourage you to use [facility] to communicate with providers for non-urgent requests or questions.  Due to long hold times on the telephone, sending your provider a message by [facility] may be a faster and more efficient way to get a response.  Please allow 48 business hours for a response.  Please remember that this is for non-urgent requests.   It was a pleasure to see you today!  Thank you for trusting me with your gastrointestinal care!

## 2022-08-14 ENCOUNTER — Ambulatory Visit: Payer: Medicare PPO | Admitting: Psychology

## 2022-08-14 DIAGNOSIS — F319 Bipolar disorder, unspecified: Secondary | ICD-10-CM | POA: Diagnosis not present

## 2022-08-14 NOTE — Progress Notes (Addendum)
[name] is a 52 y.o. female patient   Treatment Plan: Date: 08/14/2022  Diagnosis 296.80 (Unspecified bipolar or related disorder) [n/a]  Symptoms Depressed or irritable mood. (Status: maintained) -- No Description Entered  Diminished interest in or enjoyment of activities. (Status: maintained) -- No Description Entered  Lack of energy. (Status: maintained) -- No Description Entered  Low self-esteem. (Status: maintained) -- No Description Entered  Medication Status compliance  Safety none  If Suicidal or Homicidal State Action Taken: unspecified  Current Risk: low Medications Adderall (Dosage: 20mg  4X/day)  Klonapin (Dosage: 1mg  3X/day)  Lamictal (Dosage: 200mg  2x/day)  Nuedexta (Dosage: 20mg )  Vyralar (Dosage: 6mg )  Xanax (Dosage: 1mg  3X/day)  Objectives Related Problem: Develop healthy cognitive patterns and beliefs about self and the world that lead to alleviation and help prevent the relapse of mood episodes. Description: State no longer having thoughts of self-harm. Target Date: 2023-01-08 Frequency: Daily Modality: individual Progress: 80%  Related Problem: Develop healthy cognitive patterns and beliefs about self and the world that lead to alleviation and help prevent the relapse of mood episodes. Description: Verbalize any history of past and present suicidal thoughts and actions. Target Date: 2023-01-08 Frequency: Daily Modality: individual Progress: 80%  Related Problem: Develop healthy cognitive patterns and beliefs about self and the world that lead to alleviation and help prevent the relapse of mood episodes. Description: Take prescribed medications as directed. Target Date: 2023-01-08 Frequency: Daily Modality: individual Progress: 90%  Related Problem: Develop healthy cognitive patterns and beliefs about self and the world that lead to alleviation and help prevent the relapse of mood episodes. Description:  Discuss and resolve troubling personal and interpersonal issues. Target Date: 2023-01-08 Frequency: Daily Modality: individual Progress: 80% Resolve complicated grief       Target Date: 12-24       Progress: 20%  Client Response full compliance  Service Location Location, [address] [place], [place] [address] -virtual Service Code cpt [phone]  Emotion regulation skills  Related past to present  Rationally challenge thoughts or beliefs/cognitive restructuring  Identify/label emotions  Validate/empathize  Self care activities  Lifestyle change (exercise, nutrition)  Self-monitoring  Facilitate problem solving  Session notes:  f31.9 Bipolar Unspecified.  Goals: Mood stabilization, improved marital relationship, improve self esteem and elimination of self harm thoughts/tendencies. Also, patient struggles with leaving home comfortably and seeks to improve this ability. Target date for these goals is January, 2023. In addition, she needs to improve self care behaviors due to her weight and various medical conditions. Target date for this goal is Feb., 2022. Date revised to December, 2023 to allow time for behavioral implementation. Reports periodic outbursts of rage/anger. Seeks to minimize. Target date is December, 2023. Patient has realized improvement in all areas. Requests additional treatment to facilitate maintenance of progress. Goal date 12-24. Patient's husband died unexpectedly and she is suffering complicated grief. She needs additional counseling to address associated issues and manage feelings of loss, despair and anxiety. Goal date 12-24.  Meds: Vyralar (6mg ),Nuedexta (40+20mg /day), Adderall (20mg  4X/day), Xanax (1mg , 3X/day), and Klonopin (1mg  3X/day).  Patient agreed to a video Public affairs consultant) session and understands the limits of this platform. She is at home and I am in my home office.  [name] says that her sister in law [name] is coming to help her with a shower  tonight. Colonoscopy is schedule for next month to determine the next medical  intervention. Her friend [name] stayed with her from last Wednesday to Sunday. This has been very helpful to offset [name]'s loneliness. Talked about continuing to reach out to connect with others and to get out of the house.                                                                                                                                        [name]                                                                                                                                                                                        [name], PhD  Time:2:15p-3:00p 45 minutes.                           n     n

## 2022-08-21 ENCOUNTER — Ambulatory Visit (INDEPENDENT_AMBULATORY_CARE_PROVIDER_SITE_OTHER): Payer: Medicare PPO | Admitting: Psychology

## 2022-08-21 DIAGNOSIS — F319 Bipolar disorder, unspecified: Secondary | ICD-10-CM | POA: Diagnosis not present

## 2022-08-21 NOTE — Progress Notes (Signed)
[name] is a 52 y.o. female patient   Treatment Plan: Date: 08/21/2022  Diagnosis 296.80 (Unspecified bipolar or related disorder) [n/a]  Symptoms Depressed or irritable mood. (Status: maintained) -- No Description Entered  Diminished interest in or enjoyment of activities. (Status: maintained) -- No Description Entered  Lack of energy. (Status: maintained) -- No Description Entered  Low self-esteem. (Status: maintained) -- No Description Entered  Medication Status compliance  Safety none  If Suicidal or Homicidal State Action Taken: unspecified  Current Risk: low Medications Adderall (Dosage: 20mg  4X/day)  Klonapin (Dosage: 1mg  3X/day)  Lamictal (Dosage: 200mg  2x/day)  Nuedexta (Dosage: 20mg )  Vyralar (Dosage: 6mg )  Xanax (Dosage: 1mg  3X/day)  Objectives Related Problem: Develop healthy cognitive patterns and beliefs about self and the world that lead to alleviation and help prevent the relapse of mood episodes. Description: State no longer having thoughts of self-harm. Target Date: 2023-01-08 Frequency: Daily Modality: individual Progress: 80%  Related Problem: Develop healthy cognitive patterns and beliefs about self and the world that lead to alleviation and help prevent the relapse of mood episodes. Description: Verbalize any history of past and present suicidal thoughts and actions. Target Date: 2023-01-08 Frequency: Daily Modality: individual Progress: 80%  Related Problem: Develop healthy cognitive patterns and beliefs about self and the world that lead to alleviation and help prevent the relapse of mood episodes. Description: Take prescribed medications as directed. Target Date: 2023-01-08 Frequency: Daily Modality: individual Progress: 90%  Related Problem: Develop healthy cognitive patterns and beliefs about self and the world that lead to alleviation and help prevent the relapse of  mood episodes. Description: Discuss and resolve troubling personal and interpersonal issues. Target Date: 2023-01-08 Frequency: Daily Modality: individual Progress: 80% Resolve complicated grief       Target Date: 12-24       Progress: 20%  Client Response full compliance  Service Location Location, [address] [place], [place] [address] -virtual Service Code cpt [phone]  Emotion regulation skills  Related past to present  Rationally challenge thoughts or beliefs/cognitive restructuring  Identify/label emotions  Validate/empathize  Self care activities  Lifestyle change (exercise, nutrition)  Self-monitoring  Facilitate problem solving  Session notes:  f31.9 Bipolar Unspecified.  Goals: Mood stabilization, improved marital relationship, improve self esteem and elimination of self harm thoughts/tendencies. Also, patient struggles with leaving home comfortably and seeks to improve this ability. Target date for these goals is January, 2023. In addition, she needs to improve self care behaviors due to her weight and various medical conditions. Target date for this goal is Feb., 2022. Date revised to December, 2023 to allow time for behavioral implementation. Reports periodic outbursts of rage/anger. Seeks to minimize. Target date is December, 2023. Patient has realized improvement in all areas. Requests additional treatment to facilitate maintenance of progress. Goal date 12-24. Patient's husband died unexpectedly and she is suffering complicated grief. She needs additional counseling to address associated issues and manage feelings of loss, despair and anxiety. Goal date 12-24.  Meds: Vyralar (6mg ),Nuedexta (40+20mg /day), Adderall (20mg  4X/day), Xanax (1mg , 3X/day), and Klonopin (1mg  3X/day).  Patient agreed to a video Public affairs consultant) session and understands the limits of this platform. She is at home and I am in my home office.  [name] has been very anxious about her social security  situation and had called  a few times since the last session. She was denied benefits and has to appeal. I provided her with a name of a specialist with whom she can consult. She continues to be worried which contributes to her depressive feelings. [name] cries as she feels despair and fear about her future. She reports feeling confused about the circumstances. Worked with her to do some relaxation breathing and avoid escalating. She expresses concerns that she will not be able to contain her rage and distress when meeting with her psychiatrist. She is not violent, but is prone to have verbal outbursts when under pressure.  She admits that she has some suicidal ideation but no plan. She said she thought she was doing okay, but all the current stress has made her question if life is worth living. She claims she will not harm herself and admits to fears of dying. Will talk with Dr. [name] and text me after appointment to report in.                                                                                                                                           [name]                                                                                                                                                                                        [name], PhD  Time:2:15p-3:00p 45 minutes.                           n     n

## 2022-08-28 ENCOUNTER — Ambulatory Visit (INDEPENDENT_AMBULATORY_CARE_PROVIDER_SITE_OTHER): Payer: Medicare PPO | Admitting: Psychology

## 2022-08-28 DIAGNOSIS — F319 Bipolar disorder, unspecified: Secondary | ICD-10-CM

## 2022-08-28 NOTE — Progress Notes (Signed)
[name] is a 52 y.o. female patient   Treatment Plan: Date: 08/28/2022  Diagnosis 296.80 (Unspecified bipolar or related disorder) [n/a]  Symptoms Depressed or irritable mood. (Status: maintained) -- No Description Entered  Diminished interest in or enjoyment of activities. (Status: maintained) -- No Description Entered  Lack of energy. (Status: maintained) -- No Description Entered  Low self-esteem. (Status: maintained) -- No Description Entered  Medication Status compliance  Safety none  If Suicidal or Homicidal State Action Taken: unspecified  Current Risk: low Medications Adderall (Dosage: 20mg  4X/day)  Klonapin (Dosage: 1mg  3X/day)  Lamictal (Dosage: 200mg  2x/day)  Nuedexta (Dosage: 20mg )  Vyralar (Dosage: 6mg )  Xanax (Dosage: 1mg  3X/day)  Objectives Related Problem: Develop healthy cognitive patterns and beliefs about self and the world that lead to alleviation and help prevent the relapse of mood episodes. Description: State no longer having thoughts of self-harm. Target Date: 2023-01-08 Frequency: Daily Modality: individual Progress: 80%  Related Problem: Develop healthy cognitive patterns and beliefs about self and the world that lead to alleviation and help prevent the relapse of mood episodes. Description: Verbalize any history of past and present suicidal thoughts and actions. Target Date: 2023-01-08 Frequency: Daily Modality: individual Progress: 80%  Related Problem: Develop healthy cognitive patterns and beliefs about self and the world that lead to alleviation and help prevent the relapse of mood episodes. Description: Take prescribed medications as directed. Target Date: 2023-01-08 Frequency: Daily Modality: individual Progress: 90%  Related Problem: Develop healthy cognitive patterns and beliefs about self and the world that lead to alleviation and  help prevent the relapse of mood episodes. Description: Discuss and resolve troubling personal and interpersonal issues. Target Date: 2023-01-08 Frequency: Daily Modality: individual Progress: 80% Resolve complicated grief       Target Date: 12-24       Progress: 20%  Client Response full compliance  Service Location Location, [address] [place], [place] [address] -virtual Service Code cpt [phone]  Emotion regulation skills  Related past to present  Rationally challenge thoughts or beliefs/cognitive restructuring  Identify/label emotions  Validate/empathize  Self care activities  Lifestyle change (exercise, nutrition)  Self-monitoring  Facilitate problem solving  Session notes:  f31.9 Bipolar Unspecified.  Goals: Mood stabilization, improved marital relationship, improve self esteem and elimination of self harm thoughts/tendencies. Also, patient struggles with leaving home comfortably and seeks to improve this ability. Target date for these goals is January, 2023. In addition, she needs to improve self care behaviors due to her weight and various medical conditions. Target date for this goal is Feb., 2022. Date revised to December, 2023 to allow time for behavioral implementation. Reports periodic outbursts of rage/anger. Seeks to minimize. Target date is December, 2023. Patient has realized improvement in all areas. Requests additional treatment to facilitate maintenance of progress. Goal date 12-24. Patient's husband died unexpectedly and she is suffering complicated grief. She needs additional counseling to address associated issues and manage feelings of loss, despair and anxiety. Goal date 12-24.  Meds: Vyralar (6mg ),Nuedexta (40+20mg /day), Adderall (20mg  3X/day), Xanax (1mg , 3X/day), and Klonopin (1mg  3X/day).  Patient agreed to a video Public affairs consultant) session and understands the limits of this platform. She is at home and I am in my home office.  [name] says that she is not  sleeping well. She says that she told Dr. [name], but no medicine changes or recommendations were made. She says she sleeps off and on during the day. Told her to reach back out to Dr. [name] [id number]. Is Adderall interfering with her sleep? Claims that her depression remains high. She is still having ideation without a plan. We talked about my vacation and how to manage to seek help if needed. We reviewed call 911, calling a family member and a colleague to cover for me. She says she knows she will be okay, but likes have a back-up plan. She said that her sister in law is handling the search for an attorney to help [facility]. States she is fine with me having contact with [name] about the attorney and the "plan" for coverage the next 2 weeks.                                                                                                                                               [name]                                                                                                                                                                                        [name], PhD  Time:2:15p-3:00p 45 minutes.

## 2022-09-03 ENCOUNTER — Ambulatory Visit (INDEPENDENT_AMBULATORY_CARE_PROVIDER_SITE_OTHER): Payer: Medicare PPO | Admitting: Behavioral Health

## 2022-09-03 DIAGNOSIS — F319 Bipolar disorder, unspecified: Secondary | ICD-10-CM | POA: Diagnosis not present

## 2022-09-03 NOTE — Progress Notes (Signed)
                 L , LMFT 

## 2022-09-03 NOTE — Progress Notes (Addendum)
[facility] Counselor/Therapist Progress Note  Patient ID: [name], MRN: [id number],    Date: 09/03/2022  Time Spent: 16 min TC to Pt after 2 contacts this past wknd to provide boundary discussion & reinforcement of self-care habits by Pt.  Pt is home in private & Provider @ [facility] Office. Time In: 12:40pm Time Out: 12:56pm  Treatment Type:  Consult Call  Reported Symptoms: Elevated anx/dep & stress due to absence of regular Provider. Pt fully informed about discussion w/Dr. Dellia Cloud prior to his leaving on vacation.   Mental Status Exam: Appearance:  NA      Behavior: Appropriate and Sharing  Motor: NA  Speech/Language:  Clear and Coherent  Affect: Appropriate  Mood: anxious  Thought process: normal  Thought content:   Rumination  Sensory/Perceptual disturbances:   WNL  Orientation: oriented to person, place, time/date, and situation  Attention: Good  Concentration: Good  Memory: WNL  Fund of knowledge:  Good  Insight:   Fair  Judgment:  Fair  Impulse Control: Fair   Risk Assessment: Danger to Self:  No Self-injurious Behavior: No Danger to Others: No Duty to Warn:no Physical Aggression / Violence:No  Access to Firearms a concern: No  Gang Involvement:No   Subjective: Pt was receptive to call today. Discussed boundaries for contact until Aug 17th, 2024. Discussed Pt dec-mkg re: call to Provider @ 3:57am on Friday, Aug 1st, 2024. Directed Pt to use business hours for contact (9:00am-5:00pm) with realistic RTC by Provider. Pt acknowledged & agreed to boundaries.   Discussed steps Pt took to call her SIL Lorette & how this worked well for her. Emphasized good dec-mkg skills & SIL's agreement to the call. Pt voiced understanding.  Interventions:  Review of Pt dec-mkg skills & positive reinforcement of healthy boundaries.   Diagnosis:Bipolar I disorder (HCC)  Plan: Pt agreed to abide by Provider directions & acknowledged understanding of  call schedule, expectations for RTC & alternative decisions for self in the middle of the night a/or outside call boundary times.   Target Date: Immediately  Progress: 0  Frequency: Until Provider is off call & regular Provider returns.  Modality: Claretta Fraise, LMFT

## 2022-09-04 ENCOUNTER — Ambulatory Visit: Payer: Medicare PPO | Admitting: Psychology

## 2022-09-18 ENCOUNTER — Ambulatory Visit: Payer: Medicare PPO | Admitting: Psychology

## 2022-09-18 ENCOUNTER — Encounter: Payer: Medicare PPO | Admitting: Gastroenterology

## 2022-09-25 ENCOUNTER — Ambulatory Visit (INDEPENDENT_AMBULATORY_CARE_PROVIDER_SITE_OTHER): Payer: Medicare PPO | Admitting: Psychology

## 2022-09-25 DIAGNOSIS — F319 Bipolar disorder, unspecified: Secondary | ICD-10-CM | POA: Diagnosis not present

## 2022-09-25 NOTE — Progress Notes (Signed)
[name] is a 52 y.o. female patient   Treatment Plan: Date: 09/25/2022  Diagnosis 296.80 (Unspecified bipolar or related disorder) [n/a]  Symptoms Depressed or irritable mood. (Status: maintained) -- No Description Entered  Diminished interest in or enjoyment of activities. (Status: maintained) -- No Description Entered  Lack of energy. (Status: maintained) -- No Description Entered  Low self-esteem. (Status: maintained) -- No Description Entered  Medication Status compliance  Safety none  If Suicidal or Homicidal State Action Taken: unspecified  Current Risk: low Medications Adderall (Dosage: 20mg  4X/day)  Klonapin (Dosage: 1mg  3X/day)  Lamictal (Dosage: 200mg  2x/day)  Nuedexta (Dosage: 20mg )  Vyralar (Dosage: 6mg )  Xanax (Dosage: 1mg  3X/day)  Objectives Related Problem: Develop healthy cognitive patterns and beliefs about self and the world that lead to alleviation and help prevent the relapse of mood episodes. Description: State no longer having thoughts of self-harm. Target Date: 2023-01-08 Frequency: Daily Modality: individual Progress: 80%  Related Problem: Develop healthy cognitive patterns and beliefs about self and the world that lead to alleviation and help prevent the relapse of mood episodes. Description: Verbalize any history of past and present suicidal thoughts and actions. Target Date: 2023-01-08 Frequency: Daily Modality: individual Progress: 80%  Related Problem: Develop healthy cognitive patterns and beliefs about self and the world that lead to alleviation and help prevent the relapse of mood episodes. Description: Take prescribed medications as directed. Target Date: 2023-01-08 Frequency: Daily Modality: individual Progress: 90%  Related Problem: Develop healthy cognitive patterns and beliefs about self and the world  that lead to alleviation and help prevent the relapse of mood episodes. Description: Discuss and resolve troubling personal and interpersonal issues. Target Date: 2023-01-08 Frequency: Daily Modality: individual Progress: 80% Resolve complicated grief       Target Date: 12-24       Progress: 20%  Client Response full compliance  Service Location Location, [address] [place], [place] [address] -virtual Service Code cpt [phone]  Emotion regulation skills  Related past to present  Rationally challenge thoughts or beliefs/cognitive restructuring  Identify/label emotions  Validate/empathize  Self care activities  Lifestyle change (exercise, nutrition)  Self-monitoring  Facilitate problem solving  Session notes:  f31.9 Bipolar Unspecified.  Goals: Mood stabilization, improved marital relationship, improve self esteem and elimination of self harm thoughts/tendencies. Also, patient struggles with leaving home comfortably and seeks to improve this ability. Target date for these goals is January, 2023. In addition, she needs to improve self care behaviors due to her weight and various medical conditions. Target date for this goal is Feb., 2022. Date revised to December, 2023 to allow time for behavioral implementation. Reports periodic outbursts of rage/anger. Seeks to minimize. Target date is December, 2023. Patient has realized improvement in all areas. Requests additional treatment to facilitate maintenance of progress. Goal date 12-24. Patient's husband died unexpectedly and she is suffering complicated grief. She needs additional counseling to address associated issues and manage feelings of loss, despair and anxiety. Goal date 12-24.  Meds: Vyralar (6mg ),Nuedexta (40+20mg /day), Adderall (20mg  3X/day), Xanax (1mg , 3X/day), and Klonopin (1mg  3X/day).  Patient agreed to a video Public affairs consultant) session and understands the limits  of this platform. She is at home and I am in my home  office.  Session note: I spoke with Dr. [name] to let her know that I have concerns about Aeva's emotional instability and her difficulty sleeping. Dr. [name] called [name] and, after speaking with her, prescribed Trazodone and Lamictal. [name] will pick up the prescriptions tonight for [name]. She states she will comply with taking the medications. Berlyn requested that we go over her appeal for her disability.We discussed her symptoms and the importance of sharing her current state. She is still not sleeping and says she called [phone] in the middle of the night. She found it helpful. She has reliably reached out for help when in despair.                                                                                                         [name]                                                                                                                                                                                        [name], PhD  Time:2:15p-3:00p 45 minutes.

## 2022-09-27 ENCOUNTER — Other Ambulatory Visit: Payer: Self-pay | Admitting: Cardiology

## 2022-10-02 ENCOUNTER — Ambulatory Visit (INDEPENDENT_AMBULATORY_CARE_PROVIDER_SITE_OTHER): Payer: Medicare PPO | Admitting: Psychology

## 2022-10-02 DIAGNOSIS — F319 Bipolar disorder, unspecified: Secondary | ICD-10-CM

## 2022-10-02 NOTE — Progress Notes (Signed)
[name] is a 52 y.o. female patient   Treatment Plan: Date: 10/02/2022  Diagnosis 296.80 (Unspecified bipolar or related disorder) [n/a]  Symptoms Depressed or irritable mood. (Status: maintained) -- No Description Entered  Diminished interest in or enjoyment of activities. (Status: maintained) -- No Description Entered  Lack of energy. (Status: maintained) -- No Description Entered  Low self-esteem. (Status: maintained) -- No Description Entered  Medication Status compliance  Safety none  If Suicidal or Homicidal State Action Taken: unspecified  Current Risk: low Medications Adderall (Dosage: 20mg  4X/day)  Klonapin (Dosage: 1mg  3X/day)  Lamictal (Dosage: 200mg  2x/day)  Nuedexta (Dosage: 20mg )  Vyralar (Dosage: 6mg )  Xanax (Dosage: 1mg  3X/day)  Objectives Related Problem: Develop healthy cognitive patterns and beliefs about self and the world that lead to alleviation and help prevent the relapse of mood episodes. Description: State no longer having thoughts of self-harm. Target Date: 2023-01-08 Frequency: Daily Modality: individual Progress: 80%  Related Problem: Develop healthy cognitive patterns and beliefs about self and the world that lead to alleviation and help prevent the relapse of mood episodes. Description: Verbalize any history of past and present suicidal thoughts and actions. Target Date: 2023-01-08 Frequency: Daily Modality: individual Progress: 80%  Related Problem: Develop healthy cognitive patterns and beliefs about self and the world that lead to alleviation and help prevent the relapse of mood episodes. Description: Take prescribed medications as directed. Target Date: 2023-01-08 Frequency: Daily Modality: individual Progress: 90%  Related Problem: Develop healthy cognitive patterns and  beliefs about self and the world that lead to alleviation and help prevent the relapse of mood episodes. Description: Discuss and resolve troubling personal and interpersonal issues. Target Date: 2023-01-08 Frequency: Daily Modality: individual Progress: 80% Resolve complicated grief       Target Date: 12-24       Progress: 20%  Client Response full compliance  Service Location Location, [address] [place], [place] [address] -virtual Service Code cpt [phone]  Emotion regulation skills  Related past to present  Rationally challenge thoughts or beliefs/cognitive restructuring  Identify/label emotions  Validate/empathize  Self care activities  Lifestyle change (exercise, nutrition)  Self-monitoring  Facilitate problem solving  Session notes:  f31.9 Bipolar Unspecified.  Goals: Mood stabilization, improved marital relationship, improve self esteem and elimination of self harm thoughts/tendencies. Also, patient struggles with leaving home comfortably and seeks to improve this ability. Target date for these goals is January, 2023. In addition, she needs to improve self care behaviors due to her weight and various medical conditions. Target date for this goal is Feb., 2022. Date revised to December, 2023 to allow time for behavioral implementation. Reports periodic outbursts of rage/anger. Seeks to minimize. Target date is December, 2023. Patient has realized improvement in all areas. Requests additional treatment to facilitate maintenance of progress. Goal date 12-24. Patient's husband died unexpectedly and she is suffering complicated grief. She needs additional counseling to address associated issues and manage feelings of loss, despair and anxiety. Goal date 12-24.  Meds: Vyralar (6mg ),Nuedexta (40+20mg /day), Adderall (20mg  3X/day), Xanax (1mg , 3X/day), and  Klonopin (1mg  3X/day).  Patient agreed to a video Public affairs consultant) session and understands the limits of this platform. She is at  home and I am in my home office.  Session note: Wrote a letter to support Brenisha's appeal for the denial of benefits. She will pick up today. She reports that she is still not sleeping at nights and takes naps during the day. She has told Dr. [name] about her sleep issues and that the medication is not working. The anniversary of [name]'s death is 10-31[birth year]2022. She agrees that it will be helpful to acknowledge in some way. Will come up with some sort of ritual to acknowledge the day and pay tribute to [place].                                                                                                            [name]                                                                                                                                                                                        [name], PhD  Time:2:15p-3:00p 45 minutes.

## 2022-10-05 DIAGNOSIS — G4733 Obstructive sleep apnea (adult) (pediatric): Secondary | ICD-10-CM | POA: Diagnosis not present

## 2022-10-09 ENCOUNTER — Ambulatory Visit (INDEPENDENT_AMBULATORY_CARE_PROVIDER_SITE_OTHER): Payer: Medicare PPO | Admitting: Psychology

## 2022-10-09 DIAGNOSIS — F319 Bipolar disorder, unspecified: Secondary | ICD-10-CM

## 2022-10-09 NOTE — Progress Notes (Signed)
[name] is a 52 y.o. female patient   Treatment Plan: Date: 10/09/2022  Diagnosis 296.80 (Unspecified bipolar or related disorder) [n/a]  Symptoms Depressed or irritable mood. (Status: maintained) -- No Description Entered  Diminished interest in or enjoyment of activities. (Status: maintained) -- No Description Entered  Lack of energy. (Status: maintained) -- No Description Entered  Low self-esteem. (Status: maintained) -- No Description Entered  Medication Status compliance  Safety none  If Suicidal or Homicidal State Action Taken: unspecified  Current Risk: low Medications Adderall (Dosage: 20mg  4X/day)  Klonapin (Dosage: 1mg  3X/day)  Lamictal (Dosage: 200mg  2x/day)  Nuedexta (Dosage: 20mg )  Vyralar (Dosage: 6mg )  Xanax (Dosage: 1mg  3X/day)  Objectives Related Problem: Develop healthy cognitive patterns and beliefs about self and the world that lead to alleviation and help prevent the relapse of mood episodes. Description: State no longer having thoughts of self-harm. Target Date: 2023-01-08 Frequency: Daily Modality: individual Progress: 80%  Related Problem: Develop healthy cognitive patterns and beliefs about self and the world that lead to alleviation and help prevent the relapse of mood episodes. Description: Verbalize any history of past and present suicidal thoughts and actions. Target Date: 2023-01-08 Frequency: Daily Modality: individual Progress: 80%  Related Problem: Develop healthy cognitive patterns and beliefs about self and the world that lead to alleviation and help prevent the relapse of mood episodes. Description: Take prescribed medications as directed. Target Date: 2023-01-08 Frequency: Daily Modality: individual Progress: 90%  Related Problem: Develop  healthy cognitive patterns and beliefs about self and the world that lead to alleviation and help prevent the relapse of mood episodes. Description: Discuss and resolve troubling personal and interpersonal issues. Target Date: 2023-01-08 Frequency: Daily Modality: individual Progress: 80% Resolve complicated grief       Target Date: 12-24       Progress: 20%  Client Response full compliance  Service Location Location, [address] [place], [place] [address] -virtual Service Code cpt [id number]  Emotion regulation skills  Related past to present  Rationally challenge thoughts or beliefs/cognitive restructuring  Identify/label emotions  Validate/empathize  Self care activities  Lifestyle change (exercise, nutrition)  Self-monitoring  Facilitate problem solving  Session notes:  f31.9 Bipolar Unspecified.  Goals: Mood stabilization, improved marital relationship, improve self esteem and elimination of self harm thoughts/tendencies. Also, patient struggles with leaving home comfortably and seeks to improve this ability. Target date for these goals is January, 2023. In addition, she needs to improve self care behaviors due to her weight and various medical conditions. Target date for this goal is Feb., 2022. Date revised to December, 2023 to allow time for behavioral implementation. Reports periodic outbursts of rage/anger. Seeks to minimize. Target date is December, 2023. Patient has realized improvement in all areas. Requests additional treatment to facilitate maintenance of progress. Goal date 12-24. Patient's husband died unexpectedly and she is suffering complicated grief. She needs additional counseling to address associated issues and manage feelings of loss, despair and anxiety.  Goal date 12-24.  Meds: Vyralar (6mg ),Nuedexta (40+20mg /day), Adderall (20mg  3X/day), Xanax (1mg , 3X/day), and Klonopin (1mg  3X/day).  Patient agreed to a video Public affairs consultant) session and understands the limits  of this platform. She is at home and I am in my home office.  Session note:  I completed a letter for [facility] to support her appeal to get disability. She picked up letter from the office. She says that her friend [name] has ben staying with her for the past week. She says that Sunday was the anniversary of [name]'s death. She did not do anything in particular, but says she was very emotional. She did talk about feeling that the first year is probably the worst and is hoping next year will be better. She is sleeping a little, but is still up most of the night. She naps during the day. She says the meds are not working, including the "new ones" prescribed by Dr. [name]. We talked about sleep hygiene. She claims that she uses deep breathing exercises and it helps her initiate sleep.Problem is that she cannot stay asleep. Gave the name of several on line meditation/mindfulness apps. She will try and report back.                                                                                                               [name], PhD  Time:2:15p-3:00p 45 minutes.

## 2022-10-16 ENCOUNTER — Ambulatory Visit (INDEPENDENT_AMBULATORY_CARE_PROVIDER_SITE_OTHER): Payer: Medicare PPO | Admitting: Psychology

## 2022-10-16 DIAGNOSIS — F319 Bipolar disorder, unspecified: Secondary | ICD-10-CM | POA: Diagnosis not present

## 2022-10-16 NOTE — Progress Notes (Signed)
[name] is a 52 y.o. female patient   Treatment Plan: Date: 10/16/2022  Diagnosis 296.80 (Unspecified bipolar or related disorder) [n/a]  Symptoms Depressed or irritable mood. (Status: maintained) -- No Description Entered  Diminished interest in or enjoyment of activities. (Status: maintained) -- No Description Entered  Lack of energy. (Status: maintained) -- No Description Entered  Low self-esteem. (Status: maintained) -- No Description Entered  Medication Status compliance  Safety none  If Suicidal or Homicidal State Action Taken: unspecified  Current Risk: low Medications Adderall (Dosage: 20mg  4X/day)  Klonapin (Dosage: 1mg  3X/day)  Lamictal (Dosage: 200mg  2x/day)  Nuedexta (Dosage: 20mg )  Vyralar (Dosage: 6mg )  Xanax (Dosage: 1mg  3X/day)  Objectives Related Problem: Develop healthy cognitive patterns and beliefs about self and the world that lead to alleviation and help prevent the relapse of mood episodes. Description: State no longer having thoughts of self-harm. Target Date: 2023-01-08 Frequency: Daily Modality: individual Progress: 80%  Related Problem: Develop healthy cognitive patterns and beliefs about self and the world that lead to alleviation and help prevent the relapse of mood episodes. Description: Verbalize any history of past and present suicidal thoughts and actions. Target Date: 2023-01-08 Frequency: Daily Modality: individual Progress: 80%  Related Problem: Develop healthy cognitive patterns and beliefs about self and the world that lead to alleviation and help prevent the relapse of mood episodes. Description: Take prescribed medications as directed. Target Date: 2023-01-08 Frequency: Daily Modality: individual Progress:  90%  Related Problem: Develop healthy cognitive patterns and beliefs about self and the world that lead to alleviation and help prevent the relapse of mood episodes. Description: Discuss and resolve troubling personal and interpersonal issues. Target Date: 2023-01-08 Frequency: Daily Modality: individual Progress: 80% Resolve complicated grief       Target Date: 12-24       Progress: 20%  Client Response full compliance  Service Location Location, [address] [place], [place] [address] -virtual Service Code cpt [phone]  Emotion regulation skills  Related past to present  Rationally challenge thoughts or beliefs/cognitive restructuring  Identify/label emotions  Validate/empathize  Self care activities  Lifestyle change (exercise, nutrition)  Self-monitoring  Facilitate problem solving  Session notes:  f31.9 Bipolar Unspecified.  Goals: Mood stabilization, improved marital relationship, improve self esteem and elimination of self harm thoughts/tendencies. Also, patient struggles with leaving home comfortably and seeks to improve this ability. Target date for these goals is January, 2023. In addition, she needs to improve self care behaviors due to her weight and various medical conditions. Target date for this goal is Feb., 2022. Date revised to December, 2023 to allow time for behavioral implementation. Reports periodic outbursts of rage/anger. Seeks to minimize. Target date is December, 2023. Patient has realized improvement in all areas. Requests additional treatment to facilitate maintenance of progress. Goal date 12-24. Patient's husband died unexpectedly and she is suffering complicated grief. She  needs additional counseling to address associated issues and manage feelings of loss, despair and anxiety. Goal date 12-24.  Meds: Vyralar (6mg ),Nuedexta (40+20mg /day), Adderall (20mg  3X/day), Xanax (1mg , 3X/day), and Klonopin (1mg  3X/day).  Patient agreed to a video Public affairs consultant)  session and understands the limits of this platform. She is at home and I am in my home office.  Session note:   Her friend is continuing to stay at her house and [name] has enjoyed the company. She reports that she is still not sleeping well. The medication does not help her initiate sleep. She most often does not go to sleep until around 5am. [name] says she is not reaching out to Dr. [name] because she doesn't like speaking with her or her office. We talked about importance of getting her sleep on track. She will consider taking some action to address this issue. [name] is staying involved with the current political situation. This has been a good focus for her. She reports that she is "feeling better" and is starting to eat regular meals and has put on 2 pounds. More stable than she has been recently. Encouraged her to keep up her current eating ritual.                                                                                                                 .                                                                                                                                                                                        [name], PhD  Time:2:10p-3:00p 50 minutes.

## 2022-10-23 ENCOUNTER — Ambulatory Visit (INDEPENDENT_AMBULATORY_CARE_PROVIDER_SITE_OTHER): Payer: Medicare PPO | Admitting: Psychology

## 2022-10-23 DIAGNOSIS — F319 Bipolar disorder, unspecified: Secondary | ICD-10-CM

## 2022-10-23 NOTE — Progress Notes (Signed)
[name] is a 52 y.o. female patient   Treatment Plan: Date: 10/23/2022  Diagnosis 296.80 (Unspecified bipolar or related disorder) [n/a]  Symptoms Depressed or irritable mood. (Status: maintained) -- No Description Entered  Diminished interest in or enjoyment of activities. (Status: maintained) -- No Description Entered  Lack of energy. (Status: maintained) -- No Description Entered  Low self-esteem. (Status: maintained) -- No Description Entered  Medication Status compliance  Safety none  If Suicidal or Homicidal State Action Taken: unspecified  Current Risk: low Medications Adderall (Dosage: 20mg  4X/day)  Klonapin (Dosage: 1mg  3X/day)  Lamictal (Dosage: 200mg  2x/day)  Nuedexta (Dosage: 20mg )  Vyralar (Dosage: 6mg )  Xanax (Dosage: 1mg  3X/day)  Objectives Related Problem: Develop healthy cognitive patterns and beliefs about self and the world that lead to alleviation and help prevent the relapse of mood episodes. Description: State no longer having thoughts of self-harm. Target Date: 2023-01-08 Frequency: Daily Modality: individual Progress: 80%  Related Problem: Develop healthy cognitive patterns and beliefs about self and the world that lead to alleviation and help prevent the relapse of mood episodes. Description: Verbalize any history of past and present suicidal thoughts and actions. Target Date: 2023-01-08 Frequency: Daily Modality: individual Progress: 80%  Related Problem: Develop healthy cognitive patterns and beliefs about self and the world that lead to alleviation and help prevent the relapse of mood episodes. Description: Take prescribed medications as directed. Target Date: 2023-01-08 Frequency:  Daily Modality: individual Progress: 90%  Related Problem: Develop healthy cognitive patterns and beliefs about self and the world that lead to alleviation and help prevent the relapse of mood episodes. Description: Discuss and resolve troubling personal and interpersonal issues. Target Date: 2023-01-08 Frequency: Daily Modality: individual Progress: 80% Resolve complicated grief       Target Date: 12-24       Progress: 20%  Client Response full compliance  Service Location Location, [address] [place], [place] [address] -virtual Service Code cpt [phone]  Emotion regulation skills  Related past to present  Rationally challenge thoughts or beliefs/cognitive restructuring  Identify/label emotions  Validate/empathize  Self care activities  Lifestyle change (exercise, nutrition)  Self-monitoring  Facilitate problem solving  Session notes:  f31.9 Bipolar Unspecified.  Goals: Mood stabilization, improved marital relationship, improve self esteem and elimination of self harm thoughts/tendencies. Also, patient struggles with leaving home comfortably and seeks to improve this ability. Target date for these goals is January, 2023. In addition, she needs to improve self care behaviors due to her weight and various medical conditions. Target date for this goal is Feb., 2022. Date revised to December, 2023 to allow time for behavioral implementation. Reports periodic outbursts of rage/anger. Seeks to minimize. Target date is December, 2023. Patient has realized improvement in all areas. Requests additional treatment to facilitate maintenance of  progress. Goal date 12-24. Patient's husband died unexpectedly and she is suffering complicated grief. She needs additional counseling to address associated issues and manage feelings of loss, despair and anxiety. Goal date 12-24.  Meds: Vyralar (6mg ),Nuedexta (40+20mg /day), Adderall (20mg  3X/day), Xanax (1mg , 3X/day), and Klonopin (1mg  3X/day).   Patient agreed to a video Public affairs consultant) session and understands the limits of this platform. She is at home and I am in my home office.  Session note: [name] says her friend is still staying with her. She likes the company, but sometimes prefers to be alone. She has been taking her sleep meds early as we discussed, but she is still having trouble getting to sleep. She talked about her father feeling that her siblings have abandoned him in his time of grief. [name] has been very attentive to him and is distressed about her sibling's behavior. Will try to maintain boundaries and not get into the middle of sibs relationship with father.  She was excited to have her house cleaned professionally and will work to keep it up. Is complying with meds and working to take care of herself.                                                                                                                    [name]                                                                                                                                                                                        [name], PhD  Time:2:10p-3:00p 50 minutes.

## 2022-10-30 ENCOUNTER — Ambulatory Visit: Payer: Medicare PPO | Admitting: Psychology

## 2022-10-30 DIAGNOSIS — F319 Bipolar disorder, unspecified: Secondary | ICD-10-CM

## 2022-10-30 NOTE — Progress Notes (Signed)
[name] is a 52 y.o. female patient   Treatment Plan: Date: 10/30/2022  Diagnosis 296.80 (Unspecified bipolar or related disorder) [n/a]  Symptoms Depressed or irritable mood. (Status: maintained) -- No Description Entered  Diminished interest in or enjoyment of activities. (Status: maintained) -- No Description Entered  Lack of energy. (Status: maintained) -- No Description Entered  Low self-esteem. (Status: maintained) -- No Description Entered  Medication Status compliance  Safety none  If Suicidal or Homicidal State Action Taken: unspecified  Current Risk: low Medications Adderall (Dosage: 20mg  4X/day)  Klonapin (Dosage: 1mg  3X/day)  Lamictal (Dosage: 200mg  2x/day)  Nuedexta (Dosage: 20mg )  Vyralar (Dosage: 6mg )  Xanax (Dosage: 1mg  3X/day)  Objectives Related Problem: Develop healthy cognitive patterns and beliefs about self and the world that lead to alleviation and help prevent the relapse of mood episodes. Description: State no longer having thoughts of self-harm. Target Date: 2023-01-08 Frequency: Daily Modality: individual Progress: 80%  Related Problem: Develop healthy cognitive patterns and beliefs about self and the world that lead to alleviation and help prevent the relapse of mood episodes. Description: Verbalize any history of past and present suicidal thoughts and actions. Target Date: 2023-01-08 Frequency: Daily Modality: individual Progress: 80%  Related Problem: Develop healthy cognitive patterns and beliefs about self and the world that lead to alleviation and help prevent the relapse of mood episodes. Description: Take prescribed medications as directed. Target Date:  2023-01-08 Frequency: Daily Modality: individual Progress: 90%  Related Problem: Develop healthy cognitive patterns and beliefs about self and the world that lead to alleviation and help prevent the relapse of mood episodes. Description: Discuss and resolve troubling personal and interpersonal issues. Target Date: 2023-01-08 Frequency: Daily Modality: individual Progress: 80% Resolve complicated grief       Target Date: 12-24       Progress: 20%  Client Response full compliance  Service Location Location, [address] [place], [place] [address] -virtual Service Code cpt [phone]  Emotion regulation skills  Related past to present  Rationally challenge thoughts or beliefs/cognitive restructuring  Identify/label emotions  Validate/empathize  Self care activities  Lifestyle change (exercise, nutrition)  Self-monitoring  Facilitate problem solving  Session notes:  f31.9 Bipolar Unspecified.  Goals: Mood stabilization, improved marital relationship, improve self esteem and elimination of self harm thoughts/tendencies. Also, patient struggles with leaving home comfortably and seeks to improve this ability. Target date for these goals is January, 2023. In addition, she needs to improve self care behaviors due to her weight and various medical conditions. Target date for this goal is Feb., 2022. Date revised to December, 2023 to allow time for behavioral implementation. Reports periodic outbursts of rage/anger. Seeks to minimize. Target date is December,  2023. Patient has realized improvement in all areas. Requests additional treatment to facilitate maintenance of progress. Goal date 12-24. Patient's husband died unexpectedly and she is suffering complicated grief. She needs additional counseling to address associated issues and manage feelings of loss, despair and anxiety. Goal date 12-24.  Meds: Vyralar (6mg ),Nuedexta (40+20mg /day), Adderall (20mg  3X/day), Xanax (1mg , 3X/day), and  Klonopin (1mg  3X/day).  Patient agreed to a video Public affairs consultant) session and understands the limits of this platform. She is at home and I am in my home office.  Session note: [name] says her friend [name] left the other day and has not returned or called. She left many of her belongings. [name] says that [name] has a very unconventional and often disturbing history.  [name] says that she has rejected the recommendation to have surgery to repair her achillis tendon. Not sure when she will get it done. She also said that her father is having a Engineer, materials at the [employer] for her mother this Sunday. [name] will go along with her family for the ceremony.  After a couple of days when she shared that she was cutting on herself, she reports that it is no longer happening. She is, however, having thoughts that she finds disturbing about cutting on herself. Claims she will not act on those thoughts, but does find it distressing.                                                                                                                     [name]                                        [name], PhD  Time:2:10p-3:00p 50 minutes.

## 2022-11-06 ENCOUNTER — Ambulatory Visit (INDEPENDENT_AMBULATORY_CARE_PROVIDER_SITE_OTHER): Payer: Medicare PPO | Admitting: Psychology

## 2022-11-06 DIAGNOSIS — F319 Bipolar disorder, unspecified: Secondary | ICD-10-CM | POA: Diagnosis not present

## 2022-11-06 NOTE — Progress Notes (Signed)
[name] is a 52 y.o. female patient   Treatment Plan: Date: 11/06/2022  Diagnosis 296.80 (Unspecified bipolar or related disorder) [n/a]  Symptoms Depressed or irritable mood. (Status: maintained) -- No Description Entered  Diminished interest in or enjoyment of activities. (Status: maintained) -- No Description Entered  Lack of energy. (Status: maintained) -- No Description Entered  Low self-esteem. (Status: maintained) -- No Description Entered  Medication Status compliance  Safety none  If Suicidal or Homicidal State Action Taken: unspecified  Current Risk: low Medications Adderall (Dosage: 20mg  4X/day)  Klonapin (Dosage: 1mg  3X/day)  Lamictal (Dosage: 200mg  2x/day)  Nuedexta (Dosage: 20mg )  Vyralar (Dosage: 6mg )  Xanax (Dosage: 1mg  3X/day)  Objectives Related Problem: Develop healthy cognitive patterns and beliefs about self and the world that lead to alleviation and help prevent the relapse of mood episodes. Description: State no longer having thoughts of self-harm. Target Date: 2023-01-08 Frequency: Daily Modality: individual Progress: 80%  Related Problem: Develop healthy cognitive patterns and beliefs about self and the world that lead to alleviation and help prevent the relapse of mood episodes. Description: Verbalize any history of past and present suicidal thoughts and actions. Target Date: 2023-01-08 Frequency: Daily Modality: individual Progress: 80%  Related Problem: Develop healthy cognitive patterns and beliefs about self and the world that lead to alleviation and help prevent the relapse of mood episodes. Description: Take prescribed medications as  directed. Target Date: 2023-01-08 Frequency: Daily Modality: individual Progress: 90%  Related Problem: Develop healthy cognitive patterns and beliefs about self and the world that lead to alleviation and help prevent the relapse of mood episodes. Description: Discuss and resolve troubling personal and interpersonal issues. Target Date: 2023-01-08 Frequency: Daily Modality: individual Progress: 80% Resolve complicated grief       Target Date: 12-24       Progress: 20%  Client Response full compliance  Service Location Location, [address] [place], [place] [address] -virtual Service Code cpt [phone]  Emotion regulation skills  Related past to present  Rationally challenge thoughts or beliefs/cognitive restructuring  Identify/label emotions  Validate/empathize  Self care activities  Lifestyle change (exercise, nutrition)  Self-monitoring  Facilitate problem solving  Session notes:  f31.9 Bipolar Unspecified.  Goals: Mood stabilization, improved marital relationship, improve self esteem and elimination of self harm thoughts/tendencies. Also, patient struggles with leaving home comfortably and seeks to improve this ability. Target date for these goals is January, 2023. In addition, she needs to improve self care behaviors due to her weight and various medical conditions. Target date for this goal is Feb., 2022. Date revised to December, 2023 to allow time  for behavioral implementation. Reports periodic outbursts of rage/anger. Seeks to minimize. Target date is December, 2023. Patient has realized improvement in all areas. Requests additional treatment to facilitate maintenance of progress. Goal date 12-24. Patient's husband died unexpectedly and she is suffering complicated grief. She needs additional counseling to address associated issues and manage feelings of loss, despair and anxiety. Goal date 12-24.  Meds: Vyralar (6mg ),Nuedexta (40+20mg /day), Adderall (20mg  3X/day), Xanax  (1mg , 3X/day), and Klonopin (1mg  3X/day).  Patient agreed to a video Public affairs consultant) session and understands the limits of this platform. She is at home and I am in my home office.  Session note: [name] states that the memorial service for her mother at the park was very nice. She was pleased that her sister behaved, but her brother was "very annoying". He wasn't hostile, but just being overly playful. She is feeling a little better than she has been in the past few weeks. Sleep is also slightly improved. She still, however, is up most of the night, fallin asleep after 3-4am. She will sleep until early afternoon. When awake she reads, listens to TV or tries to utilize deep breathing techniques or meditations (as we have discussed). These strategies have marginal success. She cooked for the first time in many months. Thinks she will start doing more of her own meal prep. She has called me fewer times after hours during this past week as well.                                                                                                                          [name]                                      [name], PhD  Time:2:10p-3:00p 50 minutes.

## 2022-11-07 DIAGNOSIS — K632 Fistula of intestine: Secondary | ICD-10-CM | POA: Diagnosis not present

## 2022-11-07 DIAGNOSIS — K5720 Diverticulitis of large intestine with perforation and abscess without bleeding: Secondary | ICD-10-CM | POA: Diagnosis not present

## 2022-11-13 ENCOUNTER — Ambulatory Visit (INDEPENDENT_AMBULATORY_CARE_PROVIDER_SITE_OTHER): Payer: Medicare PPO | Admitting: Psychology

## 2022-11-13 DIAGNOSIS — F319 Bipolar disorder, unspecified: Secondary | ICD-10-CM

## 2022-11-13 NOTE — Progress Notes (Signed)
KAHLIYA FRALEIGH is a 52 y.o. female patient   Treatment Plan: Date: 11/13/2022  Diagnosis 296.80 (Unspecified bipolar or related disorder) [n/a]  Symptoms Depressed or irritable mood. (Status: maintained) -- No Description Entered  Diminished interest in or enjoyment of activities. (Status: maintained) -- No Description Entered  Lack of energy. (Status: maintained) -- No Description Entered  Low self-esteem. (Status: maintained) -- No Description Entered  Medication Status compliance  Safety none  If Suicidal or Homicidal State Action Taken: unspecified  Current Risk: low Medications Adderall (Dosage: 20mg  4X/day)  Klonapin (Dosage: 1mg  3X/day)  Lamictal (Dosage: 200mg  2x/day)  Nuedexta (Dosage: 20mg )  Vyralar (Dosage: 6mg )  Xanax (Dosage: 1mg  3X/day)  Objectives Related Problem: Develop healthy cognitive patterns and beliefs about self and the world that lead to alleviation and help prevent the relapse of mood episodes. Description: State no longer having thoughts of self-harm. Target Date: 2023-01-08 Frequency: Daily Modality: individual Progress: 80%  Related Problem: Develop healthy cognitive patterns and beliefs about self and the world that lead to alleviation and help prevent the relapse of mood episodes. Description: Verbalize any history of past and present suicidal thoughts and actions. Target Date: 2023-01-08 Frequency: Daily Modality: individual Progress: 80%  Related Problem: Develop healthy cognitive patterns and beliefs about self and the world that lead to alleviation and help prevent the relapse of mood episodes. Description:  Take prescribed medications as directed. Target Date: 2023-01-08 Frequency: Daily Modality: individual Progress: 90%  Related Problem: Develop healthy cognitive patterns and beliefs about self and the world that lead to alleviation and help prevent the relapse of mood episodes. Description: Discuss and resolve troubling personal and interpersonal issues. Target Date: 2023-01-08 Frequency: Daily Modality: individual Progress: 80% Resolve complicated grief       Target Date: 12-24       Progress: 20%  Client Response full compliance  Service Location Location, [address] [place], [place] [address] -virtual Service Code cpt [phone]  Emotion regulation skills  Related past to present  Rationally challenge thoughts or beliefs/cognitive restructuring  Identify/label emotions  Validate/empathize  Self care activities  Lifestyle change (exercise, nutrition)  Self-monitoring  Facilitate problem solving  Session notes:  f31.9 Bipolar Unspecified.  Goals: Mood stabilization, improved marital relationship, improve self esteem and elimination of self harm thoughts/tendencies. Also, patient struggles with leaving home comfortably and seeks to improve this ability. Target date for these goals is January, 2023. In addition, she needs to improve self care behaviors due to her weight and various medical conditions. Target  date for this goal is Feb., 2022. Date revised to December, 2023 to allow time for behavioral implementation. Reports periodic outbursts of rage/anger. Seeks to minimize. Target date is December, 2023. Patient has realized improvement in all areas. Requests additional treatment to facilitate maintenance of progress. Goal date 12-24. Patient's husband died unexpectedly and she is suffering complicated grief. She needs additional counseling to address associated issues and manage feelings of loss, despair and anxiety. Goal date 12-24.  Meds: Vyralar (6mg ),Nuedexta  (40+20mg /day), Adderall (20mg  3X/day), Xanax (1mg , 3X/day), and Klonopin (1mg  3X/day).  Patient agreed to a video Public affairs consultant) session and understands the limits of this platform. She is at home and I am in my home office.  Session note: Jaala states she is still having some sleepless nights. Went to sleep last night 6am-1pm. She has spent some time with an old high school friend, who she goes out to dinner with weekly. Has been a nice connection for her that helps offset the loneliness and isolation. She met with surgeon last week. Told to get colonoscopy asap so she can have the surgery for a colostomy. She is reluctant and says she is likely to avoid it if possible. At end of session, she revealed that she has suicidal ideation. She says she is telling people she isn't feeling well. Discussed if she needs to be in the hospital. She denies the need to be in the hospital and is confident she will not act on her feelings, and has no plan. She says she just needs to express how distressed she feels ans dissatisfied she is with her life as it is now. Assured she will reach out to family/friends or 911 if her feelings worsen.                                                                                                                              [name]                                      [name], PhD  Time:2:10p-3:00p 50 minutes.

## 2022-11-20 ENCOUNTER — Ambulatory Visit (INDEPENDENT_AMBULATORY_CARE_PROVIDER_SITE_OTHER): Payer: Medicare PPO | Admitting: Psychology

## 2022-11-20 DIAGNOSIS — F319 Bipolar disorder, unspecified: Secondary | ICD-10-CM | POA: Diagnosis not present

## 2022-11-20 NOTE — Progress Notes (Signed)
[name] is a 52 y.o. female patient   Treatment Plan: Date: 11/20/2022  Diagnosis 296.80 (Unspecified bipolar or related disorder) [n/a]  Symptoms Depressed or irritable mood. (Status: maintained) -- No Description Entered  Diminished interest in or enjoyment of activities. (Status: maintained) -- No Description Entered  Lack of energy. (Status: maintained) -- No Description Entered  Low self-esteem. (Status: maintained) -- No Description Entered  Medication Status compliance  Safety none  If Suicidal or Homicidal State Action Taken: unspecified  Current Risk: low Medications Adderall (Dosage: 20mg  4X/day)  Klonapin (Dosage: 1mg  3X/day)  Lamictal (Dosage: 200mg  2x/day)  Nuedexta (Dosage: 20mg )  Vyralar (Dosage: 6mg )  Xanax (Dosage: 1mg  3X/day)  Objectives Related Problem: Develop healthy cognitive patterns and beliefs about self and the world that lead to alleviation and help prevent the relapse of mood episodes. Description: State no longer having thoughts of self-harm. Target Date: 2023-01-08 Frequency: Daily Modality: individual Progress: 80%  Related Problem: Develop healthy cognitive patterns and beliefs about self and the world that lead to alleviation and help prevent the relapse of mood episodes. Description: Verbalize any history of past and present suicidal thoughts and actions. Target Date: 2023-01-08 Frequency: Daily Modality: individual Progress: 80%  Related Problem: Develop healthy cognitive patterns and beliefs about self and the world that lead to alleviation and help prevent the relapse of  mood episodes. Description: Take prescribed medications as directed. Target Date: 2023-01-08 Frequency: Daily Modality: individual Progress: 90%  Related Problem: Develop healthy cognitive patterns and beliefs about self and the world that lead to alleviation and help prevent the relapse of mood episodes. Description: Discuss and resolve troubling personal and interpersonal issues. Target Date: 2023-01-08 Frequency: Daily Modality: individual Progress: 80% Resolve complicated grief       Target Date: 12-24       Progress: 20%  Client Response full compliance  Service Location Location, [address] [place], [place] [address] -virtual Service Code cpt [phone]  Emotion regulation skills  Related past to present  Rationally challenge thoughts or beliefs/cognitive restructuring  Identify/label emotions  Validate/empathize  Self care activities  Lifestyle change (exercise, nutrition)  Self-monitoring  Facilitate problem solving  Session notes:  f31.9 Bipolar Unspecified.  Goals: Mood stabilization, improved marital relationship, improve self esteem and elimination of self harm thoughts/tendencies. Also, patient struggles with leaving home comfortably and seeks to improve this ability. Target date for these goals is January, 2023. In addition, she  needs to improve self care behaviors due to her weight and various medical conditions. Target date for this goal is Feb., 2022. Date revised to December, 2023 to allow time for behavioral implementation. Reports periodic outbursts of rage/anger. Seeks to minimize. Target date is December, 2023. Patient has realized improvement in all areas. Requests additional treatment to facilitate maintenance of progress. Goal date 12-24. Patient's husband died unexpectedly and she is suffering complicated grief. She needs additional counseling to address associated issues and manage feelings of loss, despair and anxiety. Goal date 12-24.  Meds: Vyralar  (6mg ),Nuedexta (40+20mg /day), Adderall (20mg  3X/day), Xanax (1mg , 3X/day), and Klonopin (1mg  3X/day).  Patient agreed to a video Public affairs consultant) session and understands the limits of this platform. She is at home and I am in my home office.  Session note: [name] states she has not been going out at all. Her friend [name] is coming over tonight to help her bathe. She did get out of the house one time since last visit to go be with her dad on his 81[age] birthday. [name] reports that she continues to feel bad and is not sleeping at nigh. She sleeps when the sun is coming up and stays in bed until mid day. Has tried numerous medication regimens, but nothing gets her on a "regular" sleep cycle. We discussed possibility of finding ways to stay active at night so she doesn't ruminate on how bad she feels. She says "I am resistant" to do anything. Encoraged her to come in to office for her next session. She sounds like she is giving up on ever feeling good, but denies suicidal thoughts/plans.                                                                                                                                 [name], PhD  Time:2:10p-3:00p 50 minutes.

## 2022-11-27 ENCOUNTER — Ambulatory Visit (INDEPENDENT_AMBULATORY_CARE_PROVIDER_SITE_OTHER): Payer: Medicare PPO | Admitting: Psychology

## 2022-11-27 DIAGNOSIS — F319 Bipolar disorder, unspecified: Secondary | ICD-10-CM | POA: Diagnosis not present

## 2022-11-27 DIAGNOSIS — F4381 Prolonged grief disorder: Secondary | ICD-10-CM | POA: Diagnosis not present

## 2022-11-27 NOTE — Progress Notes (Signed)
[name] is a 52 y.o. female patient   Treatment Plan: Date: 11/27/2022  Diagnosis 296.80 (Unspecified bipolar or related disorder) [n/a]  Symptoms Depressed or irritable mood. (Status: maintained) -- No Description Entered  Diminished interest in or enjoyment of activities. (Status: maintained) -- No Description Entered  Lack of energy. (Status: maintained) -- No Description Entered  Low self-esteem. (Status: maintained) -- No Description Entered  Medication Status compliance  Safety none  If Suicidal or Homicidal State Action Taken: unspecified  Current Risk: low Medications Adderall (Dosage: 20mg  4X/day)  Klonapin (Dosage: 1mg  3X/day)  Lamictal (Dosage: 200mg  2x/day)  Nuedexta (Dosage: 20mg )  Vyralar (Dosage: 6mg )  Xanax (Dosage: 1mg  3X/day)  Objectives Related Problem: Develop healthy cognitive patterns and beliefs about self and the world that lead to alleviation and help prevent the relapse of mood episodes. Description: State no longer having thoughts of self-harm. Target Date: 2023-01-08 Frequency: Daily Modality: individual Progress: 80%  Related Problem: Develop healthy cognitive patterns and beliefs about self and the world that lead to alleviation and help prevent the relapse of mood episodes. Description: Verbalize any history of past and present suicidal thoughts and actions. Target Date: 2023-01-08 Frequency: Daily Modality: individual Progress: 80%  Related Problem: Develop healthy cognitive patterns and beliefs about self and the world that lead to alleviation and  help prevent the relapse of mood episodes. Description: Take prescribed medications as directed. Target Date: 2023-01-08 Frequency: Daily Modality: individual Progress: 90%  Related Problem: Develop healthy cognitive patterns and beliefs about self and the world that lead to alleviation and help prevent the relapse of mood episodes. Description: Discuss and resolve troubling personal and interpersonal issues. Target Date: 2023-01-08 Frequency: Daily Modality: individual Progress: 80% Resolve complicated grief       Target Date: 12-24       Progress: 20%  Client Response full compliance  Service Location Location, [address] [place], [place] [address] -virtual Service Code cpt [phone]  Emotion regulation skills  Related past to present  Rationally challenge thoughts or beliefs/cognitive restructuring  Identify/label emotions  Validate/empathize  Self care activities  Lifestyle change (exercise, nutrition)  Self-monitoring  Facilitate problem solving  Session notes:  f31.9 Bipolar Unspecified.  Goals: Mood stabilization, improved marital relationship, improve self esteem and elimination of self harm thoughts/tendencies. Also, patient struggles with leaving home comfortably and seeks  to improve this ability. Target date for these goals is January, 2023. In addition, she needs to improve self care behaviors due to her weight and various medical conditions. Target date for this goal is Feb., 2022. Date revised to December, 2023 to allow time for behavioral implementation. Reports periodic outbursts of rage/anger. Seeks to minimize. Target date is December, 2023. Patient has realized improvement in all areas. Requests additional treatment to facilitate maintenance of progress. Goal date 12-24. Patient's husband died unexpectedly and she is suffering complicated grief. She needs additional counseling to address associated issues and manage feelings of loss, despair and anxiety. Goal  date 12-24.  Meds: Vyralar (6mg ),Nuedexta (40+20mg /day), Adderall (20mg  3X/day), Xanax (1mg , 3X/day), and Klonopin (1mg  3X/day).  Patient agreed to a video Public affairs consultant) session and understands the limits of this platform. She is at home and I am in my home office.  Session note: [name] texted a couple of times since last session stating she was having a hard time sleeping and slowing her racing negative thoughts. Said "I'm afraid to die and afraid to live". Is still searching to find meaning in her life. Denies a suicidal plan, but does think about "not being around". Spoke by phone at those times and she was able to calm down. [name] came over to be with her Saturday and is coming again tonight. She states that her medical progress is on pause until she gets a colonoscopy, which she has not scheduled. We discussed reasons she has not acted, but she is unable to identify her reluctance. While resistant, we talked about the need to initiate what she can (colonoscopy) so that the rest of treatment can proceed and she get on the road back to physical strength and independence. She says that she will call the GI doctor to schedule. Speaking to father daily, which she enjoys. She has not been going to see him frequently, but says he may come see her. [name] says she has been talking to herself lately. Reflects her loneliness. She said she also "talks to the dog". She talked about her high school friend who she occasionally sees for a meal. He asked her for a hug and admits that "it felt good".                                                                                                                                    [name], PhD  Time:2:10p-3:00p 50 minutes.

## 2022-12-04 ENCOUNTER — Ambulatory Visit (INDEPENDENT_AMBULATORY_CARE_PROVIDER_SITE_OTHER): Payer: Medicare PPO | Admitting: Psychology

## 2022-12-04 DIAGNOSIS — F4381 Prolonged grief disorder: Secondary | ICD-10-CM

## 2022-12-04 DIAGNOSIS — F319 Bipolar disorder, unspecified: Secondary | ICD-10-CM | POA: Diagnosis not present

## 2022-12-04 NOTE — Progress Notes (Signed)
[name] is a 52 y.o. female patient   Treatment Plan: Date: 12/04/2022  Diagnosis 296.80 (Unspecified bipolar or related disorder) [n/a]  Symptoms Depressed or irritable mood. (Status: maintained) -- No Description Entered  Diminished interest in or enjoyment of activities. (Status: maintained) -- No Description Entered  Lack of energy. (Status: maintained) -- No Description Entered  Low self-esteem. (Status: maintained) -- No Description Entered  Medication Status compliance  Safety none  If Suicidal or Homicidal State Action Taken: unspecified  Current Risk: low Medications Adderall (Dosage: 20mg  4X/day)  Klonapin (Dosage: 1mg  3X/day)  Lamictal (Dosage: 200mg  2x/day)  Nuedexta (Dosage: 20mg )  Vyralar (Dosage: 6mg )  Xanax (Dosage: 1mg  3X/day)  Objectives Related Problem: Develop healthy cognitive patterns and beliefs about self and the world that lead to alleviation and help prevent the relapse of mood episodes. Description: State no longer having thoughts of self-harm. Target Date: 2023-01-08 Frequency: Daily Modality: individual Progress: 80%  Related Problem: Develop healthy cognitive patterns and beliefs about self and the world that lead to alleviation and help prevent the relapse of mood episodes. Description: Verbalize any history of past and present suicidal thoughts and actions. Target Date: 2023-01-08 Frequency: Daily Modality: individual Progress: 80%  Related Problem: Develop healthy cognitive patterns and beliefs about self and the world  that lead to alleviation and help prevent the relapse of mood episodes. Description: Take prescribed medications as directed. Target Date: 2023-01-08 Frequency: Daily Modality: individual Progress: 90%  Related Problem: Develop healthy cognitive patterns and beliefs about self and the world that lead to alleviation and help prevent the relapse of mood episodes. Description: Discuss and resolve troubling personal and interpersonal issues. Target Date: 2023-01-08 Frequency: Daily Modality: individual Progress: 80% Resolve complicated grief       Target Date: 12-24       Progress: 20%  Client Response full compliance  Service Location Location, [address] [place], [place] [address] -virtual Service Code cpt [phone]  Emotion regulation skills  Related past to present  Rationally challenge thoughts or beliefs/cognitive restructuring  Identify/label emotions  Validate/empathize  Self care activities  Lifestyle change (exercise, nutrition)  Self-monitoring  Facilitate problem solving  Session notes:  f31.9 Bipolar Unspecified.  Goals: Mood stabilization, improved marital relationship, improve self esteem  and elimination of self harm thoughts/tendencies. Also, patient struggles with leaving home comfortably and seeks to improve this ability. Target date for these goals is January, 2023. In addition, she needs to improve self care behaviors due to her weight and various medical conditions. Target date for this goal is Feb., 2022. Date revised to December, 2023 to allow time for behavioral implementation. Reports periodic outbursts of rage/anger. Seeks to minimize. Target date is December, 2023. Patient has realized improvement in all areas. Requests additional treatment to facilitate maintenance of progress. Goal date 12-24. Patient's husband died unexpectedly and she is suffering complicated grief. She needs additional counseling to address associated issues and manage feelings of  loss, despair and anxiety. Goal date 12-24.  Meds: Vyralar (6mg ),Nuedexta (40+20mg /day), Adderall (20mg  3X/day), Xanax (1mg , 3X/day), and Klonopin (1mg  3X/day).   Patient agreed to a video Public affairs consultant) session and understands the limits of this platform. She is at home and I am in my home office.  Session note: [name] states that it was [name]'s birthday over the weekend and it was very difficult for her. She has been hanging out with her friend that went to her high school. He is a socially awkward person that enured extensive bullying when in high school. Has been supportive and friendly to [facility]. We discussed that she often feels in crisis in the middle of the night and it is difficult to reach peole, though she tries. We talked about using the 988 line and/or call [facility] intake. She acknowledged that she will do so if necessary. She agrees that Ketamine is worth exploring and I sent her the contact information for [facility]. She agrees to call them to schedule an appointment.                                                                                                                                      [name]                                      [name], PhD  Time:2:10p-3:00p 50 minutes.

## 2022-12-11 ENCOUNTER — Ambulatory Visit: Payer: Medicare PPO | Admitting: Psychology

## 2022-12-11 DIAGNOSIS — F4381 Prolonged grief disorder: Secondary | ICD-10-CM | POA: Diagnosis not present

## 2022-12-11 DIAGNOSIS — F319 Bipolar disorder, unspecified: Secondary | ICD-10-CM

## 2022-12-11 NOTE — Progress Notes (Signed)
[name] is a 52 y.o. female patient   Treatment Plan: Date: 12/11/2022  Diagnosis 296.80 (Unspecified bipolar or related disorder) [n/a]  Symptoms Depressed or irritable mood. (Status: maintained) -- No Description Entered  Diminished interest in or enjoyment of activities. (Status: maintained) -- No Description Entered  Lack of energy. (Status: maintained) -- No Description Entered  Low self-esteem. (Status: maintained) -- No Description Entered  Medication Status compliance  Safety none  If Suicidal or Homicidal State Action Taken: unspecified  Current Risk: low Medications Adderall (Dosage: 20mg  4X/day)  Klonapin (Dosage: 1mg  3X/day)  Lamictal (Dosage: 200mg  2x/day)  Nuedexta (Dosage: 20mg )  Vyralar (Dosage: 6mg )  Xanax (Dosage: 1mg  3X/day)  Objectives Related Problem: Develop healthy cognitive patterns and beliefs about self and the world that lead to alleviation and help prevent the relapse of mood episodes. Description: State no longer having thoughts of self-harm. Target Date: 2023-01-08 Frequency: Daily Modality: individual Progress: 80%  Related Problem: Develop healthy cognitive patterns and beliefs about self and the world that lead to alleviation and help prevent the relapse of mood episodes. Description: Verbalize any history of past and present suicidal thoughts and actions. Target Date: 2023-01-08 Frequency: Daily Modality: individual Progress: 80%  Related Problem: Develop healthy cognitive patterns and  beliefs about self and the world that lead to alleviation and help prevent the relapse of mood episodes. Description: Take prescribed medications as directed. Target Date: 2023-01-08 Frequency: Daily Modality: individual Progress: 90%  Related Problem: Develop healthy cognitive patterns and beliefs about self and the world that lead to alleviation and help prevent the relapse of mood episodes. Description: Discuss and resolve troubling personal and interpersonal issues. Target Date: 2023-01-08 Frequency: Daily Modality: individual Progress: 80% Resolve complicated grief       Target Date: 12-24       Progress: 20%  Client Response full compliance  Service Location Location, [address] [place], [place] [address] -virtual Service Code cpt [phone]  Emotion regulation skills  Related past to present  Rationally challenge thoughts or beliefs/cognitive restructuring  Identify/label emotions  Validate/empathize  Self care activities  Lifestyle change (exercise, nutrition)  Self-monitoring  Facilitate problem solving  Session  notes:  f31.9 Bipolar Unspecified.  Goals: Mood stabilization, improved marital relationship, improve self esteem and elimination of self harm thoughts/tendencies. Also, patient struggles with leaving home comfortably and seeks to improve this ability. Target date for these goals is January, 2023. In addition, she needs to improve self care behaviors due to her weight and various medical conditions. Target date for this goal is Feb., 2022. Date revised to December, 2023 to allow time for behavioral implementation. Reports periodic outbursts of rage/anger. Seeks to minimize. Target date is December, 2023. Patient has realized improvement in all areas. Requests additional treatment to facilitate maintenance of progress. Goal date 12-24. Patient's husband died unexpectedly and she is suffering complicated grief. She needs additional counseling to address associated  issues and manage feelings of loss, despair and anxiety. Goal date 12-24.  Meds: Vyralar (6mg ),Nuedexta (40+20mg /day), Adderall (20mg  3X/day), Xanax (1mg , 3X/day), and Klonopin (1mg  3X/day).   Patient agreed to a video Public affairs consultant) session and understands the limits of this platform. She is at home and I am in my home office.  Session note: [name] tried calling [facility] and was told they are not taking new patients. I called and they agreed to accept her into the program if she passes the requirements. They will be calling her. [name] says it was another difficult weekend. [name] came to visit, but that was her only contact with another person. Her sister invited her to Thanksgiving at her house, but [name] doesn't want to go. Encouraged her to reconsider because eit would be good for her to get out and will show she can manage her sister.Also suggested she get out for walks to build stamina, and consult with PT if needed.                                                                                                                                        [name]                                      [name], PhD  Time:2:10p-3:00p 50 minutes.

## 2022-12-18 ENCOUNTER — Ambulatory Visit: Payer: Medicare PPO | Admitting: Psychology

## 2022-12-21 ENCOUNTER — Telehealth: Payer: Self-pay

## 2022-12-21 NOTE — Patient Outreach (Signed)
Attempted to contact patient regarding LVM. Left voicemail for patient to return my call at [phone].  Nicholes Rough, CMA Care Guide [employer]

## 2022-12-25 ENCOUNTER — Ambulatory Visit: Payer: Medicare PPO | Admitting: Psychology

## 2022-12-25 DIAGNOSIS — F319 Bipolar disorder, unspecified: Secondary | ICD-10-CM

## 2022-12-25 DIAGNOSIS — F4381 Prolonged grief disorder: Secondary | ICD-10-CM

## 2022-12-25 NOTE — Progress Notes (Signed)
[name] is a 52 y.o. female patient   Treatment Plan: Date: 12/25/2022  Diagnosis 296.80 (Unspecified bipolar or related disorder) [n/a]  Symptoms Depressed or irritable mood. (Status: maintained) -- No Description Entered  Diminished interest in or enjoyment of activities. (Status: maintained) -- No Description Entered  Lack of energy. (Status: maintained) -- No Description Entered  Low self-esteem. (Status: maintained) -- No Description Entered  Medication Status compliance  Safety none  If Suicidal or Homicidal State Action Taken: unspecified  Current Risk: low Medications Adderall (Dosage: 20mg  4X/day)  Klonapin (Dosage: 1mg  3X/day)  Lamictal (Dosage: 200mg  2x/day)  Nuedexta (Dosage: 20mg )  Vyralar (Dosage: 6mg )  Xanax (Dosage: 1mg  3X/day)  Objectives Related Problem: Develop healthy cognitive patterns and beliefs about self and the world that lead to alleviation and help prevent the relapse of mood episodes. Description: State no longer having thoughts of self-harm. Target Date: 2023-01-08 Frequency: Daily Modality: individual Progress: 80%  Related Problem: Develop healthy cognitive patterns and beliefs about self and the world that lead to alleviation and help prevent the relapse of mood episodes. Description: Verbalize any history of past and present suicidal thoughts and actions. Target Date: 2023-01-08 Frequency: Daily Modality: individual Progress: 80%  Related Problem: Develop  healthy cognitive patterns and beliefs about self and the world that lead to alleviation and help prevent the relapse of mood episodes. Description: Take prescribed medications as directed. Target Date: 2023-01-08 Frequency: Daily Modality: individual Progress: 90%  Related Problem: Develop healthy cognitive patterns and beliefs about self and the world that lead to alleviation and help prevent the relapse of mood episodes. Description: Discuss and resolve troubling personal and interpersonal issues. Target Date: 2023-01-08 Frequency: Daily Modality: individual Progress: 80% Resolve complicated grief       Target Date: 12-24       Progress: 20%  Client Response full compliance  Service Location Location, [address] [place], [place] [address] -virtual Service Code cpt [phone]  Emotion regulation skills  Related past to present  Rationally challenge thoughts or beliefs/cognitive restructuring  Identify/label emotions  Validate/empathize  Self  care activities  Lifestyle change (exercise, nutrition)  Self-monitoring  Facilitate problem solving  Session notes:  f31.9 Bipolar Unspecified.  Goals: Mood stabilization, improved marital relationship, improve self esteem and elimination of self harm thoughts/tendencies. Also, patient struggles with leaving home comfortably and seeks to improve this ability. Target date for these goals is January, 2023. In addition, she needs to improve self care behaviors due to her weight and various medical conditions. Target date for this goal is Feb., 2022. Date revised to December, 2023 to allow time for behavioral implementation. Reports periodic outbursts of rage/anger. Seeks to minimize. Target date is December, 2023. Patient has realized improvement in all areas. Requests additional treatment to facilitate maintenance of progress. Goal date 12-24. Patient's husband died unexpectedly and she is suffering complicated grief. She needs additional  counseling to address associated issues and manage feelings of loss, despair and anxiety. Goal date 12-24.  Meds: Vyralar (6mg ),Nuedexta (40+20mg /day), Adderall (20mg  3X/day), Xanax (1mg , 3X/day), and Klonopin (1mg  3X/day).   Patient agreed to a video Public affairs consultant) session and understands the limits of this platform. She is at home and I am in my home office.  Session note: [name] filled out the remainder of her appeal to [employer]. She spoke with them and has to request an extension in writing. We discussed the parameters of that letter. She struggles managing daily tasks, and her abilities are becoming more compromised as she feels less competent. She says she feels like a "failure" because she has to rely on others to get anything done. She is going with father to niece's house for Thanksgiving. Reports she is sleeping slightly better than she was before. Talked about ways to work toward a regular sleep schedule.                                                                                                                                        [name]                                      [name], PhD  Time:2:15p-3:00p 45 minutes.

## 2023-01-01 ENCOUNTER — Ambulatory Visit (INDEPENDENT_AMBULATORY_CARE_PROVIDER_SITE_OTHER): Payer: Medicare PPO | Admitting: Psychology

## 2023-01-01 DIAGNOSIS — F4381 Prolonged grief disorder: Secondary | ICD-10-CM

## 2023-01-01 DIAGNOSIS — F319 Bipolar disorder, unspecified: Secondary | ICD-10-CM | POA: Diagnosis not present

## 2023-01-01 NOTE — Progress Notes (Signed)
[name] is a 52 y.o. female patient   Treatment Plan: Date: 01/01/2023  Diagnosis 296.80 (Unspecified bipolar or related disorder) [n/a]  Symptoms Depressed or irritable mood. (Status: maintained) -- No Description Entered  Diminished interest in or enjoyment of activities. (Status: maintained) -- No Description Entered  Lack of energy. (Status: maintained) -- No Description Entered  Low self-esteem. (Status: maintained) -- No Description Entered  Medication Status compliance  Safety none  If Suicidal or Homicidal State Action Taken: unspecified  Current Risk: low Medications Adderall (Dosage: 20mg  4X/day)  Klonapin (Dosage: 1mg  3X/day)  Lamictal (Dosage: 200mg  2x/day)  Nuedexta (Dosage: 20mg )  Vyralar (Dosage: 6mg )  Xanax (Dosage: 1mg  3X/day)  Objectives Related Problem: Develop healthy cognitive patterns and beliefs about self and the world that lead to alleviation and help prevent the relapse of mood episodes. Description: State no longer having thoughts of self-harm. Target Date: 2024-01-08 Frequency: Daily Modality: individual Progress: 80%  Related Problem: Develop healthy cognitive patterns and beliefs about self and the world that lead to alleviation and help prevent the relapse of mood episodes. Description: Verbalize any history of past and present suicidal thoughts and actions. Target Date: 2024-01-08 Frequency: Daily Modality: individual Progress: 80%  Related Problem: Develop healthy cognitive patterns and beliefs about self and the world that lead to alleviation and help prevent the relapse of mood episodes. Description: Take prescribed medications as directed. Target Date:  2024-01-08 Frequency: Daily Modality: individual Progress: 90%  Related Problem: Develop healthy cognitive patterns and beliefs about self and the world that lead to alleviation and help prevent the relapse of mood episodes. Description: Discuss and resolve troubling personal and interpersonal issues. Target Date: 2024-01-08 Frequency: Daily Modality: individual Progress: 80% Resolve complicated grief       Target Date: 12-24       Progress: 20%  Client Response full compliance  Service Location Location, [address] [place], [place] [address] -virtual Service Code cpt [phone]  Emotion regulation skills  Related past to present  Rationally challenge thoughts or beliefs/cognitive restructuring  Identify/label emotions  Validate/empathize  Self care activities  Lifestyle change (exercise, nutrition)  Self-monitoring  Facilitate problem solving  Session notes:  f31.9 Bipolar Unspecified.  Goals: Mood stabilization, improved marital relationship, improve self esteem and elimination of self harm thoughts/tendencies. Also, patient struggles with leaving home comfortably and seeks to improve this ability. Target date for these goals is January, 2023. In addition, she needs to improve self care behaviors due to her weight and various medical conditions. Target date for this goal is Feb., 2022. Date revised to December, 2023 to allow time for behavioral implementation. Reports periodic outbursts of rage/anger. Seeks to minimize. Target date is December,  2023. Patient has realized improvement in all areas. Requests additional treatment to facilitate maintenance of progress. Goal date 12-25. Patient's husband died unexpectedly and she is suffering complicated grief. She needs additional counseling to address associated issues and manage feelings of loss, despair and anxiety. Goal date 12-25.  Meds: Vyralar (6mg ),Nuedexta (40+20mg /day), Adderall (20mg  3X/day), Xanax (1mg , 3X/day), and  Klonopin (1mg  3X/day).   Patient agreed to a video Public affairs consultant) session and understands the limits of this platform. She is at home and I am in my home office.  Session note: [name] went to niece's house for Thanksgiving. She drover over with her dad and says it went well. On Sunday she went to a friends house for a "Friendsgiving". She still has not heard back from psychiatrist's office about being accepted for Ketamine treatment. I wrote the practice again today. Will refer elsewhere if we do not hear back. Talked to [facility] about coming to the office for sessions, as she does not communicate well lately over video. It may be that her participation will improve in a face to face session. She will call office to schedule.                                                                                                                                           [name]                                      [name], PhD  Time:2:15p-3:00p 45 minutes.

## 2023-01-07 DIAGNOSIS — G4733 Obstructive sleep apnea (adult) (pediatric): Secondary | ICD-10-CM | POA: Diagnosis not present

## 2023-01-08 ENCOUNTER — Ambulatory Visit: Payer: Medicare PPO | Admitting: Psychology

## 2023-01-15 ENCOUNTER — Ambulatory Visit: Payer: Medicare PPO | Admitting: Psychology

## 2023-01-15 DIAGNOSIS — F319 Bipolar disorder, unspecified: Secondary | ICD-10-CM

## 2023-01-15 NOTE — Progress Notes (Signed)
[name] is a 52 y.o. female patient   Treatment Plan: Date: 01/15/2023  Diagnosis 296.80 (Unspecified bipolar or related disorder) [n/a]  Symptoms Depressed or irritable mood. (Status: maintained) -- No Description Entered  Diminished interest in or enjoyment of activities. (Status: maintained) -- No Description Entered  Lack of energy. (Status: maintained) -- No Description Entered  Low self-esteem. (Status: maintained) -- No Description Entered  Medication Status compliance  Safety none  If Suicidal or Homicidal State Action Taken: unspecified  Current Risk: low Medications Adderall (Dosage: 20mg  4X/day)  Klonapin (Dosage: 1mg  3X/day)  Lamictal (Dosage: 200mg  2x/day)  Nuedexta (Dosage: 20mg )  Vyralar (Dosage: 6mg )  Xanax (Dosage: 1mg  3X/day)  Objectives Related Problem: Develop healthy cognitive patterns and beliefs about self and the world that lead to alleviation and help prevent the relapse of mood episodes. Description: State no longer having thoughts of self-harm. Target Date: 2024-01-08 Frequency: Daily Modality: individual Progress: 80%  Related Problem: Develop healthy cognitive patterns and beliefs about self and the world that lead to alleviation and help prevent the relapse of mood episodes. Description: Verbalize any history of past and present suicidal thoughts and actions. Target Date: 2024-01-08 Frequency: Daily Modality: individual Progress: 80%  Related Problem: Develop healthy cognitive patterns and beliefs about self and the world that lead to alleviation and help prevent the relapse of mood episodes. Description: Take prescribed medications as directed. Target Date: 2024-01-08 Frequency: Daily Modality: individual Progress: 90%  Related Problem: Develop healthy cognitive patterns and beliefs about self and the world that lead to alleviation and help prevent the relapse of mood episodes. Description: Discuss and resolve troubling  personal and interpersonal issues. Target Date: 2024-01-08 Frequency: Daily Modality: individual Progress: 80% Resolve complicated grief       Target Date: 12-25       Progress: 30%  Client Response full compliance  Service Location Location, [address] [place], [place] [address] -virtual Service Code cpt [phone]  Emotion regulation skills  Related past to present  Rationally challenge thoughts or beliefs/cognitive restructuring  Identify/label emotions  Validate/empathize  Self care activities  Lifestyle change (exercise, nutrition)  Self-monitoring  Facilitate problem solving  Session notes:  f31.9 Bipolar Unspecified.  Goals: Mood stabilization, improved marital relationship, improve self esteem and elimination of self harm thoughts/tendencies. Also, patient struggles with leaving home comfortably and seeks to improve this ability. Target date for these goals is January, 2023. In addition, she needs to improve self care behaviors due to her weight and various medical conditions. Target date for this goal is Feb., 2022. Date revised to December, 2023 to allow time for behavioral implementation. Reports periodic outbursts of rage/anger. Seeks to minimize. Target date is December, 2023. Patient has realized improvement in all areas. Requests additional treatment to facilitate maintenance of progress. Goal date 12-25. Patient's husband died unexpectedly and she is suffering complicated grief. She needs additional counseling to address associated issues and manage feelings of loss, despair and anxiety. Goal date 12-25.  Meds: Vyralar (6mg ),Nuedexta (40+20mg /day), Adderall (20mg  3X/day), Xanax (1mg , 3X/day), and Klonopin (1mg  3X/day).   Patient agreed to a video Public affairs consultant) session and understands the limits of this platform. She is at home and I am in my home office.  Session note: [name] says that her leg is still very painful. It has been a year since she injured her achilles  tendon. Encouraged her to seek the help of an orthopedic practice. She agrees and will call. [name] has her first appointment at  [facility] on Friday. This will be to discuss if she is eligible for [facility]. [name] has the paperwork and says that she will have it completed by the appointment. She talked about her sister that has 3 children and 12 grand children. [name] says that everyone in family is upset that her niece just had her 5th child.                                                                                                                                             [name]                                      [name], PhD  Time:2:15p-3:00p 45 minutes.

## 2023-01-18 ENCOUNTER — Encounter: Payer: Self-pay | Admitting: Behavioral Health

## 2023-01-18 ENCOUNTER — Ambulatory Visit: Payer: Medicare PPO | Admitting: Behavioral Health

## 2023-01-18 VITALS — BP 175/98 | HR 71 | Ht 65.0 in | Wt 250.0 lb

## 2023-01-18 DIAGNOSIS — Z0389 Encounter for observation for other suspected diseases and conditions ruled out: Secondary | ICD-10-CM

## 2023-01-18 NOTE — Progress Notes (Signed)
Pt was evaluated for Sprivato.  Due to her hx of Bipolar disorder she is not a candidate for this treatment. Sprivato is not indicated for Bipolar Disorder.   She will resume care with Dr. Dellia Cloud and Dr. Lyanne Co.  No charge for todays visit.

## 2023-01-22 ENCOUNTER — Ambulatory Visit: Payer: Medicare PPO | Admitting: Psychology

## 2023-01-29 ENCOUNTER — Ambulatory Visit: Payer: Medicare PPO | Admitting: Psychology

## 2023-01-29 DIAGNOSIS — F4381 Prolonged grief disorder: Secondary | ICD-10-CM

## 2023-01-29 DIAGNOSIS — F319 Bipolar disorder, unspecified: Secondary | ICD-10-CM

## 2023-01-29 NOTE — Progress Notes (Signed)
 [name] is a 52 y.o. female patient   Treatment Plan: Date: 01/29/2023  Diagnosis 296.80 (Unspecified bipolar or related disorder) [n/a]  Symptoms Depressed or irritable mood. (Status: maintained) -- No Description Entered  Diminished interest in or enjoyment of activities. (Status: maintained) -- No Description Entered  Lack of energy. (Status: maintained) -- No Description Entered  Low self-esteem. (Status: maintained) -- No Description Entered  Medication Status compliance  Safety none  If Suicidal or Homicidal State Action Taken: unspecified  Current Risk: low Medications Adderall (Dosage: 20mg  4X/day)  Klonapin (Dosage: 1mg  3X/day)  Lamictal  (Dosage: 200mg  2x/day)  Nuedexta  (Dosage: 20mg )  Vyralar (Dosage: 6mg )  Xanax  (Dosage: 1mg  3X/day)  Objectives Related Problem: Develop healthy cognitive patterns and beliefs about self and the world that lead to alleviation and help prevent the relapse of mood episodes. Description: State no longer having thoughts of self-harm. Target Date: 2024-01-08 Frequency: Daily Modality: individual Progress: 80%  Related Problem: Develop healthy cognitive patterns and beliefs about self and the world that lead to alleviation and help prevent the relapse of mood episodes. Description: Verbalize any history of past and present suicidal thoughts and actions. Target Date: 2024-01-08 Frequency: Daily Modality: individual Progress: 80%  Related Problem: Develop healthy cognitive patterns and beliefs about self and the world that lead to alleviation and help prevent the relapse of mood episodes. Description: Take prescribed medications as directed. Target Date: 2024-01-08 Frequency: Daily Modality: individual Progress: 90%  Related Problem: Develop healthy cognitive patterns and beliefs about self and the world that lead to alleviation and help prevent the relapse of mood episodes. Description:  Discuss and resolve troubling personal and interpersonal issues. Target Date: 2024-01-08 Frequency: Daily Modality: individual Progress: 80% Resolve complicated grief       Target Date: 12-25       Progress: 30%  Client Response full compliance  Service Location Location, [address] [place], [place] [address] -virtual Service Code cpt [phone]  Emotion regulation skills  Related past to present  Rationally challenge thoughts or beliefs/cognitive restructuring  Identify/label emotions  Validate/empathize  Self care activities  Lifestyle change (exercise, nutrition)  Self-monitoring  Facilitate problem solving  Session notes:  f31.9 Bipolar Unspecified.  Goals: Mood stabilization, improved marital relationship, improve self esteem and elimination of self harm thoughts/tendencies. Also, patient struggles with leaving home comfortably and seeks to improve this ability. Target date for these goals is January, 2023. In addition, she needs to improve self care behaviors due to her weight and various medical conditions. Target date for this goal is Feb., 2022. Date revised to December, 2023 to allow time for behavioral implementation. Reports periodic outbursts of rage/anger. Seeks to minimize. Target date is December, 2023. Patient has realized improvement in all areas. Requests additional treatment to facilitate maintenance of progress. Goal date 12-25. Patient's husband died unexpectedly and she is suffering complicated grief. She needs additional counseling to address associated issues and manage feelings of loss, despair and anxiety. Goal date 12-25.  Meds: Vyralar (6mg ),Nuedexta  (40+20mg /day), Adderall (20mg  3X/day), Xanax  (1mg , 3X/day), and Klonopin  (1mg  3X/day).   Patient agreed to a video Public Affairs Consultant) session and understands the limits of this platform. She is at home and I am in my home office.  Session note: [name] states she is very distressed that she was not accepted into  program to receive Ketamine  treatment, due to her diagnosis. She was really counting on  this treatment option. This was a very difficult week. She spent holiday with some friends and will do the same tonight for New Year's Eve. I talked to her abut exploring TMS as an option and explained that it is available at [facility]. Gave her the number and she agrees to call [phone]). I left a message at the number as well. She claims she has no desire to do anything. She claims she is not sleeping and is sometimes up all night. Also feels that her depression meds are not helping. We talked about Ambien  or sonata as an aid to help her sleep. She said she was on Ambien  in the past but has little memory if it helped. She will talk with Dr. Vincente on Jan. 15th and will address this issue and request for meds to assist her. She admits that she is doing a poor job of taking care of herself. She is essentially not doing anything or expressing an interest in anything. She is not exercising or following up with doctors about any of her medical issues (other than psychiatric). Is reluctant to schedule her colonoscopy. She does agree to do this in the [place].                                                                                                                                               SABRA                                      CONI ALM KERNS, PhD  Time:2:15p-3:00p 45 minutes.

## 2023-02-05 ENCOUNTER — Ambulatory Visit: Payer: Medicare PPO | Admitting: Psychology

## 2023-02-05 DIAGNOSIS — F4381 Prolonged grief disorder: Secondary | ICD-10-CM

## 2023-02-05 DIAGNOSIS — F319 Bipolar disorder, unspecified: Secondary | ICD-10-CM

## 2023-02-05 NOTE — Progress Notes (Signed)
 [name] is a 53 y.o. female patient   Treatment Plan: Date: 02/05/2023  Diagnosis 296.80 (Unspecified bipolar or related disorder) [n/a]  Symptoms Depressed or irritable mood. (Status: maintained) -- No Description Entered  Diminished interest in or enjoyment of activities. (Status: maintained) -- No Description Entered  Lack of energy. (Status: maintained) -- No Description Entered  Low self-esteem. (Status: maintained) -- No Description Entered  Medication Status compliance  Safety none  If Suicidal or Homicidal State Action Taken: unspecified  Current Risk: low Medications Adderall (Dosage: 20mg  4X/day)  Klonapin (Dosage: 1mg  3X/day)  Lamictal  (Dosage: 200mg  2x/day)  Nuedexta  (Dosage: 20mg )  Vyralar (Dosage: 6mg )  Xanax  (Dosage: 1mg  3X/day)  Objectives Related Problem: Develop healthy cognitive patterns and beliefs about self and the world that lead to alleviation and help prevent the relapse of mood episodes. Description: State no longer having thoughts of self-harm. Target Date: 2024-01-08 Frequency: Daily Modality: individual Progress: 80%  Related Problem: Develop healthy cognitive patterns and beliefs about self and the world that lead to alleviation and help prevent the relapse of mood episodes. Description: Verbalize any history of past and present suicidal thoughts and actions. Target Date: 2024-01-08 Frequency: Daily Modality: individual Progress: 80%  Related Problem: Develop healthy cognitive patterns and beliefs about self and the world that lead to alleviation and help prevent the relapse of mood episodes. Description: Take prescribed medications as directed. Target Date: 2024-01-08 Frequency: Daily Modality: individual Progress: 90%  Related Problem: Develop healthy cognitive patterns and beliefs about self and the world that lead to alleviation and help prevent the relapse of mood  episodes. Description: Discuss and resolve troubling personal and interpersonal issues. Target Date: 2024-01-08 Frequency: Daily Modality: individual Progress: 80% Resolve complicated grief       Target Date: 12-25       Progress: 30%  Client Response full compliance  Service Location Location, [address] [place], [place] [address] -virtual Service Code cpt [phone]  Emotion regulation skills  Related past to present  Rationally challenge thoughts or beliefs/cognitive restructuring  Identify/label emotions  Validate/empathize  Self care activities  Lifestyle change (exercise, nutrition)  Self-monitoring  Facilitate problem solving  Session notes:  f31.9 Bipolar Unspecified.  Goals: Mood stabilization, improved marital relationship, improve self esteem and elimination of self harm thoughts/tendencies. Also, patient struggles with leaving home comfortably and seeks to improve this ability. Target date for these goals is January, 2023. In addition, she needs to improve self care behaviors due to her weight and various medical conditions. Target date for this goal is Feb., 2022. Date revised to December, 2023 to allow time for behavioral implementation. Reports periodic outbursts of rage/anger. Seeks to minimize. Target date is December, 2023. Patient has realized improvement in all areas. Requests additional treatment to facilitate maintenance of progress. Goal date 12-25. Patient's husband died unexpectedly and she is suffering complicated grief. She needs additional counseling to address associated issues and manage feelings of loss, despair and anxiety. Goal date 12-25.  Meds: Vyralar (6mg ),Nuedexta  (40+20mg /day), Adderall (20mg  3X/day), Xanax  (1mg , 3X/day), and Klonopin  (1mg  3X/day).   Patient agreed to a video Public Affairs Consultant) session and understands the limits of this platform. She is at home and I am in my home office.  Session note: [name] is struggling to find purpose to keep  trying to take  care of herself. Her sleep remains disturbed as she is up and down throughout the night. Has an appointment with Dr. Vincente next Wednesday and will discuss. She says that she will not get her colonoscopy and is willing to deal with whatever consequences that decision brings. She isn't suicidal, but is saying she has given up and whatever happens is okay. She claims that [name] was the only thing that motivated her to take care of herself. She states ta she has quit smoking weed for the first time in many years. She says that it no longer helped her feel better. She does agree that if she is accepted as a TMS patient she will give it a try. I will follow up with another call to the facility. She has a niece and a nephew that have had TMS.                                                                                                                                                 SABRA                                      CONI ALM KERNS, PhD  Time:2:15p-3:00p 45 minutes.

## 2023-02-12 ENCOUNTER — Ambulatory Visit: Payer: Medicare PPO | Admitting: Psychology

## 2023-02-12 DIAGNOSIS — F4381 Prolonged grief disorder: Secondary | ICD-10-CM | POA: Diagnosis not present

## 2023-02-12 DIAGNOSIS — F319 Bipolar disorder, unspecified: Secondary | ICD-10-CM

## 2023-02-12 NOTE — Progress Notes (Signed)
 [name] is a 53 y.o. female patient   Treatment Plan: Date: 02/12/2023  Diagnosis 296.80 (Unspecified bipolar or related disorder) [n/a]  Symptoms Depressed or irritable mood. (Status: maintained) -- No Description Entered  Diminished interest in or enjoyment of activities. (Status: maintained) -- No Description Entered  Lack of energy. (Status: maintained) -- No Description Entered  Low self-esteem. (Status: maintained) -- No Description Entered  Medication Status compliance  Safety none  If Suicidal or Homicidal State Action Taken: unspecified  Current Risk: low Medications Adderall (Dosage: 20mg  4X/day)  Klonapin (Dosage: 1mg  3X/day)  Lamictal  (Dosage: 200mg  2x/day)  Nuedexta  (Dosage: 20mg )  Vyralar (Dosage: 6mg )  Xanax  (Dosage: 1mg  3X/day)  Objectives Related Problem: Develop healthy cognitive patterns and beliefs about self and the world that lead to alleviation and help prevent the relapse of mood episodes. Description: State no longer having thoughts of self-harm. Target Date: 2024-01-08 Frequency: Daily Modality: individual Progress: 80%  Related Problem: Develop healthy cognitive patterns and beliefs about self and the world that lead to alleviation and help prevent the relapse of mood episodes. Description: Verbalize any history of past and present suicidal thoughts and actions. Target Date: 2024-01-08 Frequency: Daily Modality: individual Progress: 80%  Related Problem: Develop healthy cognitive patterns and beliefs about self and the world that lead to alleviation and help prevent the relapse of mood episodes. Description: Take prescribed medications as directed. Target Date: 2024-01-08 Frequency: Daily Modality: individual Progress: 90%  Related Problem: Develop healthy cognitive patterns and beliefs about self and the world that lead to alleviation and  help prevent the relapse of mood episodes. Description: Discuss and resolve troubling personal and interpersonal issues. Target Date: 2024-01-08 Frequency: Daily Modality: individual Progress: 80% Resolve complicated grief       Target Date: 12-25       Progress: 30%  Client Response full compliance  Service Location Location, [address] [place], [place] [address] -virtual Service Code cpt [phone]  Emotion regulation skills  Related past to present  Rationally challenge thoughts or beliefs/cognitive restructuring  Identify/label emotions  Validate/empathize  Self care activities  Lifestyle change (exercise, nutrition)  Self-monitoring  Facilitate problem solving  Session notes:  f31.9 Bipolar Unspecified.  Goals: Mood stabilization, improved marital relationship, improve self esteem and elimination of self harm thoughts/tendencies. Also, patient struggles with leaving home comfortably and seeks to improve this ability. Target date for these goals is January, 2023. In addition, she needs to improve self care behaviors due to her weight and various medical conditions. Target date for this goal is Feb., 2022. Date revised to December, 2023 to allow time for behavioral implementation. Reports periodic outbursts of rage/anger. Seeks to minimize. Target date is December, 2023. Patient has realized improvement in all areas. Requests additional treatment to facilitate maintenance of progress. Goal date 12-25. Patient's husband died unexpectedly and she is suffering complicated grief. She needs additional counseling to address associated issues and manage feelings of loss, despair and anxiety. Goal date 12-25.  Meds: Vyralar (6mg ),Nuedexta  (40+20mg /day), Adderall (20mg  3X/day), Xanax  (1mg , 3X/day), and Klonopin  (1mg  3X/day).   Patient agreed to a video Public Affairs Consultant) session and understands the limits of this platform. She is at home and I am in my home office.  Session note: [name] still  has not contacted the [employer] coordinator. Claims she will call. Meets with Dr.Kaur tomorrow. She says that she feels she is living for other people.We talked about her negative sense of self. She does not think she has anything to offer anyone and does not feel entitled to enjoy anything in life. She says she has no plan at self harm and is not willing to disappoint or upset family/friends. Stressed importance of finding purpose and ways to achieve those results.                                                                                                                                                   SABRA CONI ALM EZZARD, PhD  Time:2:10p-3:00p 50 minutes.

## 2023-02-19 ENCOUNTER — Ambulatory Visit: Payer: Medicare PPO | Admitting: Psychology

## 2023-02-19 DIAGNOSIS — F4381 Prolonged grief disorder: Secondary | ICD-10-CM | POA: Diagnosis not present

## 2023-02-19 DIAGNOSIS — F319 Bipolar disorder, unspecified: Secondary | ICD-10-CM | POA: Diagnosis not present

## 2023-02-19 NOTE — Progress Notes (Signed)
[name] is a 53 y.o. female patient   Treatment Plan: Date: 02/19/2023  Diagnosis 296.80 (Unspecified bipolar or related disorder) [n/a]  Symptoms Depressed or irritable mood. (Status: maintained) -- No Description Entered  Diminished interest in or enjoyment of activities. (Status: maintained) -- No Description Entered  Lack of energy. (Status: maintained) -- No Description Entered  Low self-esteem. (Status: maintained) -- No Description Entered  Medication Status compliance  Safety none  If Suicidal or Homicidal State Action Taken: unspecified  Current Risk: low Medications Adderall (Dosage: 20mg  4X/day)  Klonapin (Dosage: 1mg  3X/day)  Lamictal (Dosage: 200mg  2x/day)  Nuedexta (Dosage: 20mg )  Vyralar (Dosage: 6mg )  Xanax (Dosage: 1mg  3X/day)  Objectives Related Problem: Develop healthy cognitive patterns and beliefs about self and the world that lead to alleviation and help prevent the relapse of mood episodes. Description: State no longer having thoughts of self-harm. Target Date: 2024-01-08 Frequency: Daily Modality: individual Progress: 80%  Related Problem: Develop healthy cognitive patterns and beliefs about self and the world that lead to alleviation and help prevent the relapse of mood episodes. Description: Verbalize any history of past and present suicidal thoughts and actions. Target Date: 2024-01-08 Frequency: Daily Modality: individual Progress: 80%  Related Problem: Develop healthy cognitive patterns and beliefs about self and the world that lead to alleviation and help prevent the relapse of mood episodes. Description: Take prescribed medications as directed. Target Date: 2024-01-08 Frequency: Daily Modality: individual Progress: 90%  Related Problem: Develop healthy cognitive patterns and beliefs about self and the world  that lead to alleviation and help prevent the relapse of mood episodes. Description: Discuss and resolve troubling personal and interpersonal issues. Target Date: 2024-01-08 Frequency: Daily Modality: individual Progress: 80% Resolve complicated grief       Target Date: 12-25       Progress: 30%  Client Response full compliance  Service Location Location, [address] [place], [place] [address] -virtual Service Code cpt [phone]  Emotion regulation skills  Related past to present  Rationally challenge thoughts or beliefs/cognitive restructuring  Identify/label emotions  Validate/empathize  Self care activities  Lifestyle change (exercise, nutrition)  Self-monitoring  Facilitate problem solving  Session notes:  f31.9 Bipolar Unspecified.  Goals: Mood stabilization, improved marital relationship, improve self esteem and elimination of self harm thoughts/tendencies. Also, patient struggles with leaving home comfortably and seeks to improve this ability. Target date for these goals is January, 2023. In addition, she needs to improve self care behaviors due to her weight and various medical conditions. Target date for this goal is Feb., 2022. Date revised to December, 2023 to allow time for behavioral implementation. Reports periodic outbursts of rage/anger. Seeks to minimize. Target date is December, 2023. Patient has realized improvement in all areas. Requests additional treatment to facilitate maintenance of progress. Goal date 12-25. Patient's husband died unexpectedly and she is suffering complicated grief. She needs additional counseling to address associated issues and manage feelings of loss, despair and anxiety. Goal date 12-25.  Meds: Vyralar (6mg ),Nuedexta (40+20mg /day), Adderall (20mg  3X/day), Xanax (1mg , 3X/day), and Klonopin (1mg  3X/day).   Patient agreed to a video Public affairs consultant) session and understands the  limits of this platform. She is at home and I am in my home  office.  Session note: Dura spoke with Dr. [name] and was told she is not a candidate for TMS due to her diagnosis. She is extremely disappointed and feels helpless. I contacted Dr. [name] after I got that information to ask about other options and if ECT is a possibility. She agreed and said she would write a referral. Told her I would talk with [facility] about this option. I shared with Elfredia that I had communicated with Dr. [name] and we both felt ECT is an appropriate option. She says that she she does not want to do it. She states that she is worried about losing memories and she "has no interest at all". I asked her to consider getting information about the procedure from a provider, but she says she "has no interest at all", and doesn't like the "stigma" associated with it. She would not articulate any further. Suggested this is her self-protection against being disappointed yet again. She agrees that is "part of it". She went to [place] and [place] for dinner and out  to a restaurant with her female friend for dinner. These are positive signs of progress. Encouraged her to give the ECT option more thought/consideration and we will talk further.                                                                                                                                                       [name]                                      [name], PhD  Time:2:15p-3:00p 45 minutes.

## 2023-02-20 ENCOUNTER — Other Ambulatory Visit: Payer: Self-pay | Admitting: Cardiology

## 2023-02-26 ENCOUNTER — Ambulatory Visit (INDEPENDENT_AMBULATORY_CARE_PROVIDER_SITE_OTHER): Payer: Medicare PPO | Admitting: Psychology

## 2023-02-26 DIAGNOSIS — F319 Bipolar disorder, unspecified: Secondary | ICD-10-CM

## 2023-02-26 DIAGNOSIS — F4381 Prolonged grief disorder: Secondary | ICD-10-CM

## 2023-02-26 NOTE — Progress Notes (Signed)
[name] is a 53 y.o. female patient   Treatment Plan: Date: 02/26/2023  Diagnosis 296.80 (Unspecified bipolar or related disorder) [n/a]  Symptoms Depressed or irritable mood. (Status: maintained) -- No Description Entered  Diminished interest in or enjoyment of activities. (Status: maintained) -- No Description Entered  Lack of energy. (Status: maintained) -- No Description Entered  Low self-esteem. (Status: maintained) -- No Description Entered  Medication Status compliance  Safety none  If Suicidal or Homicidal State Action Taken: unspecified  Current Risk: low Medications Adderall (Dosage: 20mg  4X/day)  Klonapin (Dosage: 1mg  3X/day)  Lamictal (Dosage: 200mg  2x/day)  Nuedexta (Dosage: 20mg )  Vyralar (Dosage: 6mg )  Xanax (Dosage: 1mg  3X/day)  Objectives Related Problem: Develop healthy cognitive patterns and beliefs about self and the world that lead to alleviation and help prevent the relapse of mood episodes. Description: State no longer having thoughts of self-harm. Target Date: 2024-01-08 Frequency: Daily Modality: individual Progress: 80%  Related Problem: Develop healthy cognitive patterns and beliefs about self and the world that lead to alleviation and help prevent the relapse of mood episodes. Description: Verbalize any history of past and present suicidal thoughts and actions. Target Date: 2024-01-08 Frequency: Daily Modality: individual Progress: 80%  Related Problem: Develop healthy cognitive patterns and beliefs about self and the world that lead to alleviation and help prevent the relapse of mood episodes. Description: Take prescribed medications as directed. Target Date: 2024-01-08 Frequency: Daily Modality: individual Progress: 90%  Related Problem: Develop healthy cognitive patterns and  beliefs about self and the world that lead to alleviation and help prevent the relapse of mood episodes. Description: Discuss and resolve troubling personal and interpersonal issues. Target Date: 2024-01-08 Frequency: Daily Modality: individual Progress: 80% Resolve complicated grief       Target Date: 12-25       Progress: 30%  Client Response full compliance  Service Location Location, [address] [place], [place] [address] -virtual Service Code cpt [phone]  Emotion regulation skills  Related past to present  Rationally challenge thoughts or beliefs/cognitive restructuring  Identify/label emotions  Validate/empathize  Self care activities  Lifestyle change (exercise, nutrition)  Self-monitoring  Facilitate problem solving  Session notes:  f31.9 Bipolar Unspecified.  Goals: Mood stabilization, improved marital relationship, improve self esteem and elimination of self harm thoughts/tendencies. Also, patient struggles with leaving home comfortably and seeks to improve this ability. Target date for these goals is January, 2023. In addition, she needs to improve self care behaviors due to her weight and various medical conditions. Target date for this goal is Feb., 2022. Date revised to December, 2023 to allow time for behavioral implementation. Reports periodic outbursts of rage/anger. Seeks to minimize. Target date is December, 2023. Patient has realized improvement in all areas. Requests additional treatment to facilitate maintenance of progress. Goal date 12-25. Patient's husband died unexpectedly and she is suffering complicated grief. She needs additional counseling to address associated issues and manage feelings of loss, despair and anxiety. Goal date 12-25.  Meds: Vyralar (6mg ),Nuedexta (40+20mg /day), Adderall (20mg  3X/day), Xanax (1mg , 3X/day), and  Klonopin (1mg  3X/day).   Patient agreed to a video Public affairs consultant) session and understands the limits of this platform. She is at  home and I am in my home office.  Session note: [name] says she continues to feel bad and has been reaching out to friends. She speaks to them on the phone and it helps her feel better. Sent her information about ECT and she maintains she will not consider this treatment option. Says she fears memory loss. Discussed that it is a relatively minor risk, especially considering the potential for a positive outcome. She states that it is not negotiable. She states that she talked with friends and family all of whom told her they agree with her decision to not try ECT. I told her that this video format is not as effective for treatment and that I want her to come to the office. She has significant background noise (Cpap and TV) and is often to the side of the video. I alsop shared my concern about her non-compliance with her medical treatments. She says she will make appointment to get to my office. I directed scheduler to call her to get her an office appointment. Malana again agreed to contact me or get help if she doesn't feel safe.                                                                                                                                                                              [name]                                      [name], PhD  Time:2:15p-3:00p 50 minutes.

## 2023-03-05 ENCOUNTER — Ambulatory Visit: Payer: Medicare PPO | Admitting: Psychology

## 2023-03-05 DIAGNOSIS — F319 Bipolar disorder, unspecified: Secondary | ICD-10-CM

## 2023-03-05 NOTE — Progress Notes (Signed)
 [name] is a 53 y.o. female patient   Treatment Plan: Date: 03/05/2023  Diagnosis 296.80 (Unspecified bipolar or related disorder) [n/a]  Symptoms Depressed or irritable mood. (Status: maintained) -- No Description Entered  Diminished interest in or enjoyment of activities. (Status: maintained) -- No Description Entered  Lack of energy. (Status: maintained) -- No Description Entered  Low self-esteem. (Status: maintained) -- No Description Entered  Medication Status compliance  Safety none  If Suicidal or Homicidal State Action Taken: unspecified  Current Risk: low Medications Adderall (Dosage: 20mg  4X/day)  Klonapin (Dosage: 1mg  3X/day)  Lamictal  (Dosage: 200mg  2x/day)  Nuedexta  (Dosage: 20mg )  Vyralar (Dosage: 6mg )  Xanax  (Dosage: 1mg  3X/day)  Objectives Related Problem: Develop healthy cognitive patterns and beliefs about self and the world that lead to alleviation and help prevent the relapse of mood episodes. Description: State no longer having thoughts of self-harm. Target Date: 2024-01-08 Frequency: Daily Modality: individual Progress: 80%  Related Problem: Develop healthy cognitive patterns and beliefs about self and the world that lead to alleviation and help prevent the relapse of mood episodes. Description: Verbalize any history of past and present suicidal thoughts and actions. Target Date: 2024-01-08 Frequency: Daily Modality: individual Progress: 80%  Related Problem: Develop healthy cognitive patterns and beliefs about self and the world that lead to alleviation and help prevent the relapse of mood episodes. Description: Take prescribed medications as directed. Target Date: 2024-01-08 Frequency: Daily Modality: individual Progress: 90%  Related Problem: Develop  healthy cognitive patterns and beliefs about self and the world that lead to alleviation and help prevent the relapse of mood episodes. Description: Discuss and resolve troubling personal and interpersonal issues. Target Date: 2024-01-08 Frequency: Daily Modality: individual Progress: 80% Resolve complicated grief       Target Date: 12-25       Progress: 30%  Client Response full compliance  Service Location Location, [address] [place], [place] [address] -virtual Service Code cpt [id number]  Emotion regulation skills  Related past to present  Rationally challenge thoughts or beliefs/cognitive restructuring  Identify/label emotions  Validate/empathize  Self care activities  Lifestyle change (exercise, nutrition)  Self-monitoring  Facilitate problem solving  Session notes:  f31.9 Bipolar Unspecified.  Goals: Mood stabilization, improved marital relationship, improve self esteem and elimination of self harm thoughts/tendencies. Also, patient struggles with leaving home comfortably and seeks to improve this ability. Target date for these goals is January, 2023. In addition, she needs to improve self care behaviors due to her weight and various medical conditions. Target date for this goal is Feb., 2022. Date revised to December, 2023 to allow time for behavioral implementation. Reports periodic outbursts of rage/anger. Seeks to minimize. Target date is December, 2023. Patient has realized improvement in all areas. Requests additional treatment to facilitate maintenance of progress. Goal date 12-25. Patient's husband died unexpectedly and she is suffering complicated grief. She needs additional counseling to address associated issues and manage feelings of loss, despair and anxiety.  Goal date 12-25.  Meds: Vyralar (6mg ),Nuedexta  (40+20mg /day), Adderall (20mg  3X/day), Xanax  (1mg , 3X/day), and Klonopin  (1mg  3X/day).   Patient agreed to a video Public Affairs Consultant) session and understands the  limits of this platform. She is at home and I am in my home office.  Session note: [name] says that she has been dreaming about [name]. They are good dreams and she is happy in the dreams. When she wakes up and realizes it was all a dream, she gets depressed. She continues to meet up with her friend Koren and they have dinner together. She says that her friend Roseline is coming to visit with her tomorrow night and tonight she is going to dinner with another friend. She is clearly willing to go out and interact more with others. This is a positive development in her journey back. She says she is cared of dying, but is afraid of living.                                                                                                                                                                                  SABRA                                      CONI ALM KERNS, PhD  Time:2:15p-3:00p 50 minutes.

## 2023-03-07 ENCOUNTER — Ambulatory Visit: Payer: Medicare PPO | Admitting: Psychology

## 2023-03-07 DIAGNOSIS — F319 Bipolar disorder, unspecified: Secondary | ICD-10-CM

## 2023-03-07 NOTE — Progress Notes (Signed)
 [name] is a 53 y.o. female patient   Treatment Plan: Date: 03/07/2023  Diagnosis 296.80 (Unspecified bipolar or related disorder) [n/a]  Symptoms Depressed or irritable mood. (Status: maintained) -- No Description Entered  Diminished interest in or enjoyment of activities. (Status: maintained) -- No Description Entered  Lack of energy. (Status: maintained) -- No Description Entered  Low self-esteem. (Status: maintained) -- No Description Entered  Medication Status compliance  Safety none  If Suicidal or Homicidal State Action Taken: unspecified  Current Risk: low Medications Adderall (Dosage: 20mg  4X/day)  Klonapin (Dosage: 1mg  3X/day)  Lamictal  (Dosage: 200mg  2x/day)  Nuedexta  (Dosage: 20mg )  Vyralar (Dosage: 6mg )  Xanax  (Dosage: 1mg  3X/day)  Objectives Related Problem: Develop healthy cognitive patterns and beliefs about self and the world that lead to alleviation and help prevent the relapse of mood episodes. Description: State no longer having thoughts of self-harm. Target Date: 2024-01-08 Frequency: Daily Modality: individual Progress: 80%  Related Problem: Develop healthy cognitive patterns and beliefs about self and the world that lead to alleviation and help prevent the relapse of mood episodes. Description: Verbalize any history of past and present suicidal thoughts and actions. Target Date: 2024-01-08 Frequency: Daily Modality: individual Progress: 80%  Related Problem: Develop healthy cognitive patterns and beliefs about self and the world that lead to alleviation and help prevent the relapse of mood episodes. Description: Take prescribed medications as directed. Target Date: 2024-01-08 Frequency: Daily Modality: individual Progress:  90%  Related Problem: Develop healthy cognitive patterns and beliefs about self and the world that lead to alleviation and help prevent the relapse of mood episodes. Description: Discuss and resolve troubling personal and interpersonal issues. Target Date: 2024-01-08 Frequency: Daily Modality: individual Progress: 80% Resolve complicated grief       Target Date: 12-25       Progress: 30%  Client Response full compliance  Service Location Location, [address] [place], [place] [address] -virtual Service Code cpt [phone]  Emotion regulation skills  Related past to present  Rationally challenge thoughts or beliefs/cognitive restructuring  Identify/label emotions  Validate/empathize  Self care activities  Lifestyle change (exercise, nutrition)  Self-monitoring  Facilitate problem solving  Session notes:  f31.9 Bipolar Unspecified.  Goals: Mood stabilization, improved marital relationship, improve self esteem and elimination of self harm thoughts/tendencies. Also, patient struggles with leaving home comfortably and seeks to improve this ability. Target date for these goals is January, 2023. In addition, she needs to improve self care behaviors due to her weight and various medical conditions. Target date for this goal is Feb., 2022. Date revised to December, 2023 to allow time for behavioral implementation. Reports periodic outbursts of rage/anger. Seeks to minimize. Target date is December, 2023. Patient has realized improvement in all areas. Requests additional treatment to facilitate maintenance of progress. Goal date 12-25. Patient's husband died unexpectedly and she is suffering complicated grief. She  needs additional counseling to address associated issues and manage feelings of loss, despair and anxiety. Goal date 12-25.  Meds: Vyralar (6mg ),Nuedexta  (40+20mg /day), Adderall (20mg  3X/day), Xanax  (1mg , 3X/day), and Klonopin  (1mg  3X/day).   Patient agreed to be seen in provider's  office. Session note: [name] came in for a face to face session for the first time in a very long time. She says she still needs to have a colonoscopy to determine how much colon to be removed. We talked about the need for her to follow through with medical appointments. She admits she is avoiding that issue. She is now shopping on line for groceries (which is an improvement) but says she is too overwhelmed to go into a store because of the number of people. She does manage to go to a restaurant with her friend Koren, but says she goes to out of the way places that are not busy.  Agrees to be seen in office on occasion.                                                                                                                                                                                    SABRA                                      CONI ALM KERNS, PhD  Time:12:15p-1:15 30 minutes.

## 2023-03-12 ENCOUNTER — Ambulatory Visit: Payer: Medicare PPO | Admitting: Psychology

## 2023-03-12 DIAGNOSIS — F319 Bipolar disorder, unspecified: Secondary | ICD-10-CM

## 2023-03-12 NOTE — Progress Notes (Signed)
[name] is a 53 y.o. female patient   Treatment Plan: Date: 03/12/2023  Diagnosis 296.80 (Unspecified bipolar or related disorder) [n/a]  Symptoms Depressed or irritable mood. (Status: maintained) -- No Description Entered  Diminished interest in or enjoyment of activities. (Status: maintained) -- No Description Entered  Lack of energy. (Status: maintained) -- No Description Entered  Low self-esteem. (Status: maintained) -- No Description Entered  Medication Status compliance  Safety none  If Suicidal or Homicidal State Action Taken: unspecified  Current Risk: low Medications Adderall (Dosage: 20mg  4X/day)  Klonapin (Dosage: 1mg  3X/day)  Lamictal (Dosage: 200mg  2x/day)  Nuedexta (Dosage: 20mg )  Vyralar (Dosage: 6mg )  Xanax (Dosage: 1mg  3X/day)  Objectives Related Problem: Develop healthy cognitive patterns and beliefs about self and the world that lead to alleviation and help prevent the relapse of mood episodes. Description: State no longer having thoughts of self-harm. Target Date: 2024-01-08 Frequency: Daily Modality: individual Progress: 80%  Related Problem: Develop healthy cognitive patterns and beliefs about self and the world that lead to alleviation and help prevent the relapse of mood episodes. Description: Verbalize any history of past and present suicidal thoughts and actions. Target Date: 2024-01-08 Frequency: Daily Modality: individual Progress: 80%  Related Problem: Develop healthy cognitive patterns and beliefs about self and the world that lead to alleviation and help prevent the relapse of mood episodes. Description: Take prescribed medications as directed. Target Date: 2024-01-08 Frequency:  Daily Modality: individual Progress: 90%  Related Problem: Develop healthy cognitive patterns and beliefs about self and the world that lead to alleviation and help prevent the relapse of mood episodes. Description: Discuss and resolve troubling personal and interpersonal issues. Target Date: 2024-01-08 Frequency: Daily Modality: individual Progress: 80% Resolve complicated grief       Target Date: 12-25       Progress: 30%  Client Response full compliance  Service Location Location, [address] [place], [place] [address] -virtual Service Code cpt [phone]  Emotion regulation skills  Related past to present  Rationally challenge thoughts or beliefs/cognitive restructuring  Identify/label emotions  Validate/empathize  Self care activities  Lifestyle change (exercise, nutrition)  Self-monitoring  Facilitate problem solving  Session notes:  f31.9 Bipolar Unspecified.  Goals: Mood stabilization, improved marital relationship, improve self esteem and elimination of self harm thoughts/tendencies. Also, patient struggles with leaving home comfortably and seeks to improve this ability. Target date for these goals is January, 2023. In addition, she needs to improve self care behaviors due to her weight and various medical conditions. Target date for this goal is Feb., 2022. Date revised to December, 2023 to allow time for behavioral implementation. Reports periodic outbursts of rage/anger. Seeks to minimize. Target date is December, 2023. Patient has realized improvement in all areas. Requests additional treatment to facilitate maintenance of  progress. Goal date 12-25. Patient's husband died unexpectedly and she is suffering complicated grief. She needs additional counseling to address associated issues and manage feelings of loss, despair and anxiety. Goal date 12-25.  Meds: Vyralar (6mg ),Nuedexta (40+20mg /day), Adderall (20mg  3X/day), Xanax (1mg , 3X/day), and Klonopin (1mg  3X/day).    Patient agreed to a video session Public affairs consultant) and understands the limitations of the platform. She is at home and provider in his office.   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 She had a nice time with the others at the fire. It is very positive that she continues to accept invitations to go out.We revisited her thought that she is "afraid to live and afraid to die". Biggest obstacle to overcome at this point is her resistance to comply with medical interventions. In particular, she will not return to GI doc for a colonoscopy or the surgeon to discuss the need for a temporary ostomy. She has agreed to schedule every other appointment in the office.                                                                                                                                                                                       [name]                                      [name], PhD  Time:2:15p-3:00p 45 minutes.

## 2023-03-18 ENCOUNTER — Other Ambulatory Visit: Payer: Self-pay | Admitting: Cardiology

## 2023-03-19 ENCOUNTER — Ambulatory Visit: Payer: Medicare PPO | Admitting: Psychology

## 2023-03-20 ENCOUNTER — Ambulatory Visit: Payer: Medicare PPO | Admitting: Psychology

## 2023-03-20 DIAGNOSIS — F319 Bipolar disorder, unspecified: Secondary | ICD-10-CM

## 2023-03-20 NOTE — Progress Notes (Signed)
[name] is a 53 y.o. female patient   Treatment Plan: Date: 03/20/2023  Diagnosis 296.80 (Unspecified bipolar or related disorder) [n/a]  Symptoms Depressed or irritable mood. (Status: maintained) -- No Description Entered  Diminished interest in or enjoyment of activities. (Status: maintained) -- No Description Entered  Lack of energy. (Status: maintained) -- No Description Entered  Low self-esteem. (Status: maintained) -- No Description Entered  Medication Status compliance  Safety none  If Suicidal or Homicidal State Action Taken: unspecified  Current Risk: low Medications Adderall (Dosage: 20mg  4X/day)  Klonapin (Dosage: 1mg  3X/day)  Lamictal (Dosage: 200mg  2x/day)  Nuedexta (Dosage: 20mg )  Vyralar (Dosage: 6mg )  Xanax (Dosage: 1mg  3X/day)  Objectives Related Problem: Develop healthy cognitive patterns and beliefs about self and the world that lead to alleviation and help prevent the relapse of mood episodes. Description: State no longer having thoughts of self-harm. Target Date: 2024-01-08 Frequency: Daily Modality: individual Progress: 80%  Related Problem: Develop healthy cognitive patterns and beliefs about self and the world that lead to alleviation and help prevent the relapse of mood episodes. Description: Verbalize any history of past and present suicidal thoughts and actions. Target Date: 2024-01-08 Frequency: Daily Modality: individual Progress: 80%  Related Problem: Develop healthy cognitive patterns and beliefs about self and the world that lead to alleviation and help prevent the relapse of mood episodes. Description: Take prescribed medications as directed. Target Date:  2024-01-08 Frequency: Daily Modality: individual Progress: 90%  Related Problem: Develop healthy cognitive patterns and beliefs about self and the world that lead to alleviation and help prevent the relapse of mood episodes. Description: Discuss and resolve troubling personal and interpersonal issues. Target Date: 2024-01-08 Frequency: Daily Modality: individual Progress: 80% Resolve complicated grief       Target Date: 12-25       Progress: 30%  Client Response full compliance  Service Location Location, [address] [place], [place] [address] -virtual Service Code cpt [id number]  Emotion regulation skills  Related past to present  Rationally challenge thoughts or beliefs/cognitive restructuring  Identify/label emotions  Validate/empathize  Self care activities  Lifestyle change (exercise, nutrition)  Self-monitoring  Facilitate problem solving  Session notes:  f31.9 Bipolar Unspecified.  Goals: Mood stabilization, improved marital relationship, improve self esteem and elimination of self harm thoughts/tendencies. Also, patient struggles with leaving home comfortably and seeks to improve this ability. Target date for these goals is January, 2023. In addition, she needs to improve self care behaviors due to her weight and various medical conditions. Target date for this goal is Feb., 2022. Date revised to December, 2023 to allow time for behavioral implementation. Reports periodic outbursts of rage/anger. Seeks to minimize. Target date is December,  2023. Patient has realized improvement in all areas. Requests additional treatment to facilitate maintenance of progress. Goal date 12-25. Patient's husband died unexpectedly and she is suffering complicated grief. She needs additional counseling to address associated issues and manage feelings of loss, despair and anxiety. Goal date 12-25.  Meds: Vyralar (6mg ),Nuedexta (40+20mg /day), Adderall (20mg  3X/day), Xanax (1mg , 3X/day), and  Klonopin (1mg  3X/day).   Patient agreed to a video session Public affairs consultant) and understands the limitations of the platform. She is at home and provider in his office.   Session note: [name] is very upset that she got another communication from disability requesting more information. She did not see it until today that it was due a few days ago. Told her to call to request an extension. She did this during session and was told she could have more time. She is overwhelmed by the volume of information requested. I told her we can go through the form together and I can help her fill it out. Otherwise she does not think she is able to complete the form. When she gets in this state she feels like "giving up". We went through the questions and she reports it helped her anxiety.                                                                                                                                                                                         [name], PhD  Time:2:15p-3:00p 45 minutes.

## 2023-03-26 ENCOUNTER — Ambulatory Visit (INDEPENDENT_AMBULATORY_CARE_PROVIDER_SITE_OTHER): Payer: Medicare PPO | Admitting: Psychology

## 2023-03-26 DIAGNOSIS — F319 Bipolar disorder, unspecified: Secondary | ICD-10-CM

## 2023-03-26 NOTE — Progress Notes (Signed)
 [name] is a 53 y.o. female patient   Treatment Plan: Date: 03/26/2023  Diagnosis 296.80 (Unspecified bipolar or related disorder) [n/a]  Symptoms Depressed or irritable mood. (Status: maintained) -- No Description Entered  Diminished interest in or enjoyment of activities. (Status: maintained) -- No Description Entered  Lack of energy. (Status: maintained) -- No Description Entered  Low self-esteem. (Status: maintained) -- No Description Entered  Medication Status compliance  Safety none  If Suicidal or Homicidal State Action Taken: unspecified  Current Risk: low Medications Adderall (Dosage: 20mg  4X/day)  Klonapin (Dosage: 1mg  3X/day)  Lamictal (Dosage: 200mg  2x/day)  Nuedexta (Dosage: 20mg )  Vyralar (Dosage: 6mg )  Xanax (Dosage: 1mg  3X/day)  Objectives Related Problem: Develop healthy cognitive patterns and beliefs about self and the world that lead to alleviation and help prevent the relapse of mood episodes. Description: State no longer having thoughts of self-harm. Target Date: 2024-01-08 Frequency: Daily Modality: individual Progress: 80%  Related Problem: Develop healthy cognitive patterns and beliefs about self and the world that lead to alleviation and help prevent the relapse of mood episodes. Description: Verbalize any history of past and present suicidal thoughts and actions. Target Date: 2024-01-08 Frequency: Daily Modality: individual Progress: 80%  Related Problem: Develop healthy cognitive patterns and beliefs about self and the world that lead to alleviation and help prevent the relapse of mood episodes. Description: Take prescribed medications as  directed. Target Date: 2024-01-08 Frequency: Daily Modality: individual Progress: 90%  Related Problem: Develop healthy cognitive patterns and beliefs about self and the world that lead to alleviation and help prevent the relapse of mood episodes. Description: Discuss and resolve troubling personal and interpersonal issues. Target Date: 2024-01-08 Frequency: Daily Modality: individual Progress: 80% Resolve complicated grief       Target Date: 12-25       Progress: 30%  Client Response full compliance  Service Location Location, [address] [place], [place] [address] -virtual Service Code cpt [id number]  Emotion regulation skills  Related past to present  Rationally challenge thoughts or beliefs/cognitive restructuring  Identify/label emotions  Validate/empathize  Self care activities  Lifestyle change (exercise, nutrition)  Self-monitoring  Facilitate problem solving  Session notes:  f31.9 Bipolar Unspecified.  Goals: Mood stabilization, improved marital relationship, improve self esteem and elimination of self harm thoughts/tendencies. Also, patient struggles with leaving home comfortably and seeks to improve this ability. Target date for these goals is January, 2023. In addition, she needs to improve self care behaviors due to her weight and various medical conditions. Target date for this goal is Feb., 2022. Date revised to December, 2023 to allow time  for behavioral implementation. Reports periodic outbursts of rage/anger. Seeks to minimize. Target date is December, 2023. Patient has realized improvement in all areas. Requests additional treatment to facilitate maintenance of progress. Goal date 12-25. Patient's husband died unexpectedly and she is suffering complicated grief. She needs additional counseling to address associated issues and manage feelings of loss, despair and anxiety. Goal date 12-25.  Meds: Vyralar (6mg ),Nuedexta (40+20mg /day), Adderall (20mg  3X/day), Xanax  (1mg , 3X/day), and Klonopin (1mg  3X/day).   Patient agreed to a video session Public affairs consultant) and understands the limitations of the platform. She is at home and provider in his office.   Session note: [name] asked that I help her finish her form from her disability office. This exercise took the entire session to complete. She is relieved to have this completed and states she will get it in the mail when her friend visits tomorrow night. We discussed ways to contain her anxiety about these insurance/disability issues. This causes her great distress/worry.                                                                                                                                                                                           [name]                                      [name], PhD  Time:2:15p-3:00p 45 minutes.

## 2023-04-02 ENCOUNTER — Ambulatory Visit: Payer: Medicare PPO | Admitting: Psychology

## 2023-04-02 DIAGNOSIS — F319 Bipolar disorder, unspecified: Secondary | ICD-10-CM | POA: Diagnosis not present

## 2023-04-02 NOTE — Progress Notes (Signed)
 [name] is a 53 y.o. female patient   Treatment Plan: Date: 04/02/2023  Diagnosis 296.80 (Unspecified bipolar or related disorder) [n/a]  Symptoms Depressed or irritable mood. (Status: maintained) -- No Description Entered  Diminished interest in or enjoyment of activities. (Status: maintained) -- No Description Entered  Lack of energy. (Status: maintained) -- No Description Entered  Low self-esteem. (Status: maintained) -- No Description Entered  Medication Status compliance  Safety none  If Suicidal or Homicidal State Action Taken: unspecified  Current Risk: low Medications Adderall (Dosage: 20mg  4X/day)  Klonapin (Dosage: 1mg  3X/day)  Lamictal (Dosage: 200mg  2x/day)  Nuedexta (Dosage: 20mg )  Vyralar (Dosage: 6mg )  Xanax (Dosage: 1mg  3X/day)  Objectives Related Problem: Develop healthy cognitive patterns and beliefs about self and the world that lead to alleviation and help prevent the relapse of mood episodes. Description: State no longer having thoughts of self-harm. Target Date: 2024-01-08 Frequency: Daily Modality: individual Progress: 80%  Related Problem: Develop healthy cognitive patterns and beliefs about self and the world that lead to alleviation and help prevent the relapse of mood episodes. Description: Verbalize any history of past and present suicidal thoughts and actions. Target Date: 2024-01-08 Frequency: Daily Modality: individual Progress: 80%  Related Problem: Develop healthy cognitive patterns and beliefs about self and the world that lead to alleviation and help prevent the relapse of mood episodes. Description: Take prescribed medications as directed. Target Date: 2024-01-08 Frequency: Daily Modality: individual Progress: 90%  Related Problem: Develop healthy cognitive patterns and beliefs about self and the world that lead to alleviation and help prevent the relapse of mood episodes. Description: Discuss and resolve troubling  personal and interpersonal issues. Target Date: 2024-01-08 Frequency: Daily Modality: individual Progress: 80% Resolve complicated grief       Target Date: 12-25       Progress: 30%  Client Response full compliance  Service Location Location, [address] [place], [place] [address] -virtual Service Code cpt [phone]  Emotion regulation skills  Related past to present  Rationally challenge thoughts or beliefs/cognitive restructuring  Identify/label emotions  Validate/empathize  Self care activities  Lifestyle change (exercise, nutrition)  Self-monitoring  Facilitate problem solving  Session notes:  f31.9 Bipolar Unspecified.  Goals: Mood stabilization, improved marital relationship, improve self esteem and elimination of self harm thoughts/tendencies. Also, patient struggles with leaving home comfortably and seeks to improve this ability. Target date for these goals is January, 2023. In addition, she needs to improve self care behaviors due to her weight and various medical conditions. Target date for this goal is Feb., 2022. Date revised to December, 2023 to allow time for behavioral implementation. Reports periodic outbursts of rage/anger. Seeks to minimize. Target date is December, 2023. Patient has realized improvement in all areas. Requests additional treatment to facilitate maintenance of progress. Goal date 12-25. Patient's husband died unexpectedly and she is suffering complicated grief. She needs additional counseling to address associated issues and manage feelings of loss, despair and anxiety. Goal date 12-25.  Meds: Vyralar (6mg ),Nuedexta (40+20mg /day), Adderall (20mg  3X/day), Xanax (1mg , 3X/day), and Klonopin (1mg  3X/day).   Patient agreed to a video session Public affairs consultant) and understands the limitations of the platform. She is at home and provider in his office.   Session note: [name] still feels that she is both afraid to live and afraid to die. Explains that she does  not feel she has a future without [name]. We talked about her despair about being alone and the difficulty she has at night.  Still cannot sleep at night and that is when she is most anxious, worried and depressed. She met with old HS friend that she could relate to in that the woman's husband had committed suicide after serving in [place] and suffering PTSD. [name] shared she struggles to find purpose and optimism for the future.                                                                                                                                                                                             [name]                                      [name], PhD  Time:2:15p-3:00p 45 minutes.

## 2023-04-08 DIAGNOSIS — G4733 Obstructive sleep apnea (adult) (pediatric): Secondary | ICD-10-CM | POA: Diagnosis not present

## 2023-04-09 ENCOUNTER — Ambulatory Visit: Payer: Medicare PPO | Admitting: Psychology

## 2023-04-09 DIAGNOSIS — F319 Bipolar disorder, unspecified: Secondary | ICD-10-CM

## 2023-04-09 DIAGNOSIS — F4381 Prolonged grief disorder: Secondary | ICD-10-CM

## 2023-04-09 NOTE — Progress Notes (Signed)
 [name] is a 53 y.o. female patient   Treatment Plan: Date: 04/09/2023  Diagnosis 296.80 (Unspecified bipolar or related disorder) [n/a]  Symptoms Depressed or irritable mood. (Status: maintained) -- No Description Entered  Diminished interest in or enjoyment of activities. (Status: maintained) -- No Description Entered  Lack of energy. (Status: maintained) -- No Description Entered  Low self-esteem. (Status: maintained) -- No Description Entered  Medication Status compliance  Safety none  If Suicidal or Homicidal State Action Taken: unspecified  Current Risk: low Medications Adderall (Dosage: 20mg  4X/day)  Klonapin (Dosage: 1mg  3X/day)  Lamictal (Dosage: 200mg  2x/day)  Nuedexta (Dosage: 20mg )  Vyralar (Dosage: 6mg )  Xanax (Dosage: 1mg  3X/day)  Objectives Related Problem: Develop healthy cognitive patterns and beliefs about self and the world that lead to alleviation and help prevent the relapse of mood episodes. Description: State no longer having thoughts of self-harm. Target Date: 2024-01-08 Frequency: Daily Modality: individual Progress: 80%  Related Problem: Develop healthy cognitive patterns and beliefs about self and the world that lead to alleviation and help prevent the relapse of mood episodes. Description: Verbalize any history of past and present suicidal thoughts and actions. Target Date: 2024-01-08 Frequency: Daily Modality: individual Progress: 80%  Related Problem: Develop healthy cognitive patterns and beliefs about self and the world that lead to alleviation and help prevent the relapse of mood episodes. Description: Take prescribed medications as directed. Target Date: 2024-01-08 Frequency: Daily Modality: individual Progress: 90%  Related Problem: Develop healthy cognitive patterns and beliefs about self and the world that lead to alleviation and help prevent the relapse of mood episodes. Description:  Discuss and resolve troubling personal and interpersonal issues. Target Date: 2024-01-08 Frequency: Daily Modality: individual Progress: 80% Resolve complicated grief       Target Date: 12-25       Progress: 30%  Client Response full compliance  Service Location Location, [address] [place], [place] [address] -virtual Service Code cpt [phone]  Emotion regulation skills  Related past to present  Rationally challenge thoughts or beliefs/cognitive restructuring  Identify/label emotions  Validate/empathize  Self care activities  Lifestyle change (exercise, nutrition)  Self-monitoring  Facilitate problem solving  Session notes:  f31.9 Bipolar Unspecified.  Goals: Mood stabilization, improved marital relationship, improve self esteem and elimination of self harm thoughts/tendencies. Also, patient struggles with leaving home comfortably and seeks to improve this ability. Target date for these goals is January, 2023. In addition, she needs to improve self care behaviors due to her weight and various medical conditions. Target date for this goal is Feb., 2022. Date revised to December, 2023 to allow time for behavioral implementation. Reports periodic outbursts of rage/anger. Seeks to minimize. Target date is December, 2023. Patient has realized improvement in all areas. Requests additional treatment to facilitate maintenance of progress. Goal date 12-25. Patient's husband died unexpectedly and she is suffering complicated grief. She needs additional counseling to address associated issues and manage feelings of loss, despair and anxiety. Goal date 12-25.  Meds: Vyralar (6mg ),Nuedexta (40+20mg /day), Adderall (20mg  3X/day), Xanax (1mg , 3X/day), and Klonopin (1mg  3X/day).   Patient agreed to a video session Public affairs consultant) and understands the limitations of the platform. She is at home and provider in his office.   Session note: Pierrette says that her asthma has worsened. She plans to make an  appointment to see Dr. Vassie Loll to get it checked. She admits that she has  not been taking her pulmonary meds because she gets thrush. Talked about consulting with Dr. Vassie Loll about her sleep issues and if he has any recommendations for medication. She is still up most all night and sleeping during the day. She is relieved that she now has neighbors for first time in 8 months.                                                                                                                                                                                              [name], PhD  Time:2:15p-3:00p 45 minutes.

## 2023-04-15 ENCOUNTER — Ambulatory Visit: Payer: Medicare PPO

## 2023-04-15 VITALS — Ht 65.0 in | Wt 255.0 lb

## 2023-04-15 DIAGNOSIS — Z0000 Encounter for general adult medical examination without abnormal findings: Secondary | ICD-10-CM

## 2023-04-15 NOTE — Patient Instructions (Addendum)
 Ms. Angelica , Thank you for taking time to come for your Medicare Wellness Visit. I appreciate your ongoing commitment to your health goals. Please review the following plan we discussed and let me know if I can assist you in the future.   Referrals/Orders/Follow-Ups/Clinician Recommendations:   This is a list of the screening recommended for you and due dates:  Health Maintenance  Topic Date Due   Yearly kidney health urinalysis for diabetes  Never done   Zoster (Shingles) Vaccine (1 of 2) Never done   Colon Cancer Screening  Never done   Pap with HPV screening  10/26/2018   Mammogram  11/20/2019   Eye exam for diabetics  01/10/2021   COVID-19 Vaccine (7 - 2024-25 season) 09/30/2022   Hemoglobin A1C  11/11/2022   Pneumococcal Vaccination (2 of 2 - PCV) 11/15/2022   Complete foot exam   11/15/2022   Flu Shot  04/29/2023*   Yearly kidney function blood test for diabetes  05/23/2023   DTaP/Tdap/Td vaccine (2 - Td or Tdap) 03/18/2024   Medicare Annual Wellness Visit  04/14/2024   Hepatitis C Screening  Completed   HIV Screening  Completed   HPV Vaccine  Aged Out  *Topic was postponed. The date shown is not the original due date.    Advanced directives: (Copy Requested) Please bring a copy of your health care power of attorney and living will to the office to be added to your chart at your convenience. You can mail to [facility] [address] [place], [place] [address] or email to [email or web address]  Next Medicare Annual Wellness Visit scheduled for next year: Yes

## 2023-04-15 NOTE — Progress Notes (Signed)
 Subjective:   [name] is a 53 y.o. who presents for a Medicare Wellness preventive visit.  Visit Complete: Virtual I connected with  [name] on 04/15/23 by a audio enabled telemedicine application and verified that I am speaking with the correct person using two identifiers.  Patient Location: Home  Provider Location: Home Office  I discussed the limitations of evaluation and management by telemedicine. The patient expressed understanding and agreed to proceed.  Vital Signs: Because this visit was a virtual/telehealth visit, some criteria may be missing or patient reported. Any vitals not documented were not able to be obtained and vitals that have been documented are patient reported.  VideoDeclined- This patient declined Librarian, academic. Therefore the visit was completed with audio only.  Persons Participating in Visit: Patient.  AWV Questionnaire: Yes: Patient Medicare AWV questionnaire was completed by the patient on 04/13/23; I have confirmed that all information answered by patient is correct and no changes since this date.  Cardiac Risk Factors include: advanced age (>73men, >1 women);diabetes mellitus;hypertension     Objective:    Today's Vitals   04/15/23 1315  Weight: 255 lb (115.7 kg)  Height: 5\' 5"  (1.651 m)   Body mass index is 42.43 kg/m.     04/15/2023    1:30 PM 05/10/2022    8:00 PM 05/10/2022    2:47 PM 04/02/2022    2:38 PM 12/11/2021    6:57 PM 03/30/2021    2:29 PM 03/28/2020    2:13 PM  Advanced Directives  Does Patient Have a Medical Advance Directive? Yes Yes No Yes No Yes Yes  Type of Estate agent of [place];Living will Living will  Healthcare Power of [place];Living will  Healthcare Power of [place];Living will Healthcare Power of [place];Living will  Does patient want to make changes to medical advance directive?  No - Patient declined    No - Patient declined No -  Patient declined  Copy of Healthcare Power of Attorney in Chart? No - copy requested   No - copy requested  No - copy requested No - copy requested  Would patient like information on creating a medical advance directive?     No - Patient declined      Current Medications (verified) Outpatient Encounter Medications as of 04/15/2023  Medication Sig   acetaminophen (TYLENOL) 325 MG tablet Take 2 tablets (650 mg total) by mouth every 6 (six) hours as needed for mild pain (or Fever >/= 101).   albuterol (VENTOLIN HFA) 108 (90 Base) MCG/ACT inhaler Inhale 2 puffs into the lungs every 6 (six) hours as needed for wheezing or shortness of breath.   ALPRAZolam (XANAX XR) 1 MG 24 hr tablet Take 1 mg by mouth in the morning and at bedtime. Taking twice daily along with 1mg  tablet   ALPRAZolam (XANAX) 1 MG tablet Take 1 mg by mouth 2 (two) times daily.   amphetamine-dextroamphetamine (ADDERALL) 20 MG tablet Take 20 mg by mouth in the morning, at noon, in the evening, and at bedtime.    Ascorbic Acid (VITAMIN C) 500 MG tablet Take 1,000 mg by mouth daily.   aspirin 81 MG tablet Take 1 tablet (81 mg total) by mouth daily.   atorvastatin (LIPITOR) 20 MG tablet TAKE 1 TABLET BY MOUTH EVERY DAY IN THE EVENING (Patient taking differently: Take 20 mg by mouth daily.)   b complex vitamins tablet Take 1 tablet by mouth daily.   Blood Glucose Monitoring Suppl (  ACCU-CHEK AVIVA PLUS) w/Device KIT As directed. (Patient not taking: Reported on 08/08/2022)   Calcium Citrate (CITRACAL PO) Take 2 tablets by mouth daily.   Cholecalciferol (VITAMIN D3) 5000 UNITS TABS Take 10,000 Units by mouth in the morning and at bedtime.   Chromium Picolinate 500 MCG TABS Take 500 mcg by mouth daily.    diclofenac Sodium (VOLTAREN) 1 % GEL Apply 2 g topically 3 (three) times daily as needed (knee pain).   fluticasone (FLONASE) 50 MCG/ACT nasal spray Place 2 sprays into both nostrils daily for 7 days.   furosemide (LASIX) 40 MG tablet Take  1 tablet (40 mg total) by mouth 2 (two) times daily.   levalbuterol (XOPENEX HFA) 45 MCG/ACT inhaler Inhale 2 puffs into the lungs every 6 (six) hours as needed for wheezing.   lidocaine (LIDODERM) 5 % PLACE 1 PATCH ONTO THE SKIN DAILY AS NEEDED FOR KNEE PAIN. REMOVE AND DISCARD PATCH WITHIN 12 HOURS OR AS DIRECTED BY DOCTOR. (Patient taking differently: Place 1 patch onto the skin as needed (knee pain).)   loratadine (CLARITIN) 10 MG tablet Take 1 tablet (10 mg total) by mouth daily for 10 days.   losartan (COZAAR) 25 MG tablet TAKE 1 TABLET TWICE DAILY   LYRICA 300 MG capsule Take 300 mg by mouth 2 (two) times daily.    MAGNESIUM PO Take 500 mg by mouth daily.    Multiple Vitamin (MULTIVITAMIN) tablet Take 1 tablet by mouth daily.   NUEDEXTA 20-10 MG CAPS Take 1 capsule by mouth every 12 (twelve) hours.    Omega-3 Fatty Acids (FISH OIL) 1200 MG CAPS Take 2,400 mg by mouth daily.    potassium chloride SA (KLOR-CON M20) 20 MEQ tablet Take 1 tablet (20 mEq total) by mouth daily.   Sodium Chloride Flush (NORMAL SALINE FLUSH) 0.9 % SOLN Use to flush drain 1-2 times daily.   VRAYLAR 6 MG CAPS Take 6 mg by mouth every evening.    Zinc 50 MG CAPS Take 50 mg by mouth in the morning and at bedtime.   No facility-administered encounter medications on file as of 04/15/2023.    Allergies (verified) Savella [milnacipran hcl]   History: Past Medical History:  Diagnosis Date   Acute systolic congestive heart failure (HCC)    Agoraphobia    Anxiety    panic attacks   Arthritis    osteoarthritis bilal. knees   Asthma    no per PFT 7/13; reports does not have asthma   Bipolar disorder (HCC)    Carpal tunnel syndrome of left wrist 05/2011   Being evaluated for MS   Cor pulmonale (HCC)    Depression    Depression    History of pleural effusion    Hyperlipidemia    Hypertension    Hypoxemia    history of - no home O2   IBS (irritable bowel syndrome)    Morbid obesity (HCC)    Obesity  hypoventilation syndrome (HCC)    Perforation of sigmoid colon due to diverticulitis 12/13/2021   Pneumonia    Pre-diabetes    Prediabetes    Seasonal allergies    current runny nose   Seizures (HCC)    febrile seizure x 1 as a child. Several times   Sleep apnea sleep study 07/02/2010   uses CPAP nightly   Spinal stenosis in cervical region    Urinary incontinence    Past Surgical History:  Procedure Laterality Date   APPLICATION OF INTRAOPERATIVE CT SCAN N/A 05/14/2019  Procedure: APPLICATION OF INTRAOPERATIVE CT SCAN;  Surgeon: Maeola Harman, MD;  Location: [facility];  Service: Neurosurgery;  Laterality: N/A;   CARPAL TUNNEL RELEASE  06/27/2011   Procedure: CARPAL TUNNEL RELEASE;  Surgeon: Nicki Reaper, MD;  Location: [facility];  Service: Orthopedics;  Laterality: Left;   CARPAL TUNNEL RELEASE  12/19/2011   Procedure: CARPAL TUNNEL RELEASE;  Surgeon: Nicki Reaper, MD;  Location: [facility];  Service: Orthopedics;  Laterality: Right;   INCISION AND DRAINAGE ABSCESS Left 08/27/2017   Procedure: INCISION AND DRAINAGE LEFT BUTTOCK  ABSCESS;  Surgeon: Glenna Fellows, MD;  Location: [facility];  Service: General;  Laterality: Left;   IR RADIOLOGIST EVAL & MGMT  03/16/2022   IR RADIOLOGIST EVAL & MGMT  04/18/2022   IR SINUS/FIST TUBE CHK-NON GI  01/03/2022   IR SINUS/FIST TUBE CHK-NON GI  01/03/2022   IR SINUS/FIST TUBE CHK-NON GI  01/03/2022   IR SINUS/FIST TUBE CHK-NON GI  01/03/2022   IRRIGATION AND DEBRIDEMENT BUTTOCKS Left 08/30/2017   Procedure: IRRIGATION AND DEBRIDEMENT RE-EXCISION OF LEFT SUBCUTANEOUS BUTTOCKS ABCESS;  Surgeon: Rodman Pickle, MD;  Location: [facility];  Service: General;  Laterality: Left;   LAPAROSCOPY N/A 12/11/2021   Procedure: LAPAROSCOPIC PERITONEAL LAVAGE WITH DRAIN PLACEMENT;  Surgeon: Quentin Ore, MD;  Location: [facility] OR;  Service: General;  Laterality: N/A;   MASS EXCISION  08/07/2010   right index   POSTERIOR CERVICAL  FUSION/FORAMINOTOMY  09/11/2011   Procedure: POSTERIOR CERVICAL FUSION/FORAMINOTOMY LEVEL 1;  Surgeon: Maeola Harman, MD;  Location: [facility];  Service: Neurosurgery;  Laterality: N/A;  Cervical Three-Four Posteior Cervical Fusion and Decompression.   POSTERIOR CERVICAL FUSION/FORAMINOTOMY N/A 05/14/2019   Procedure: Posterior cervical decompression/fusion Cervical four to Thoracic one with exploration/revision of Cervical threee-four fusion;  Surgeon: Maeola Harman, MD;  Location[facility] OR;  Service: Neurosurgery;  Laterality: N/A;   TRIGGER FINGER RELEASE  12/19/2011   Procedure: RELEASE TRIGGER FINGER/A-1 PULLEY;  Surgeon: Nicki Reaper, MD;  Location: [facility];  Service: Orthopedics;  Laterality: Right;   TRIGGER FINGER RELEASE Left 12/31/2012   Procedure: RELEASE A-1 PULLEY LEFT THUMB;  Surgeon: Nicki Reaper, MD;  Location: [facility];  Service: Orthopedics;  Laterality: Left;   WISDOM TOOTH EXTRACTION     Family History  Problem Relation Age of Onset   Asthma Mother    Allergic rhinitis Mother    Multiple sclerosis Mother    Asthma Maternal Grandmother    Breast cancer Neg Hx    Social History   Socioeconomic History   Marital status: Widowed    Spouse name: Not on file   Number of children: 0   Years of education: Not on file   Highest education level: Bachelor's degree (e.g., BA, AB, BS)  Occupational History   Occupation: currently unemployed  Tobacco Use   Smoking status: Never   Smokeless tobacco: Never  Vaping Use   Vaping status: Never Used  Substance and Sexual Activity   Alcohol use: Yes    Comment: rare   Drug use: No   Sexual activity: Not Currently  Other Topics Concern   Not on file  Social History Narrative   Lives in [place]         Social Drivers of Health   Financial Resource Strain: High Risk (04/15/2023)   Overall Financial Resource Strain (CARDIA)    Difficulty of Paying Living Expenses: Very hard  Food  Insecurity: Patient Declined (  04/15/2023)   Hunger Vital Sign    Worried About Running Out of Food in the Last Year: Patient declined    Ran Out of Food in the Last Year: Patient declined  Transportation Needs: No Transportation Needs (04/15/2023)   PRAPARE - Administrator, Civil Service (Medical): No    Lack of Transportation (Non-Medical): No  Physical Activity: Inactive (04/15/2023)   Exercise Vital Sign    Days of Exercise per Week: 0 days    Minutes of Exercise per Session: 0 min  Stress: Stress Concern Present (04/15/2023)   [employer] of Occupational Health - Occupational Stress Questionnaire    Feeling of Stress : Very much  Social Connections: Unknown (04/15/2023)   Social Connection and Isolation Panel [NHANES]    Frequency of Communication with Friends and Family: More than three times a week    Frequency of Social Gatherings with Friends and Family: Once a week    Attends Religious Services: Patient declined    Database administrator or Organizations: No    Attends Engineer, structural: Not on file    Marital Status: Widowed    Tobacco Counseling Counseling given: Not Answered    Clinical Intake:  Pre-visit preparation completed: Yes  Pain : No/denies pain     BMI - recorded: 42.43 Nutritional Status: BMI > 30  Obese Nutritional Risks: None Diabetes: Yes CBG done?: No Did pt. bring in CBG monitor from home?: No  How often do you need to have someone help you when you read instructions, pamphlets, or other written materials from your doctor or pharmacy?: 3 - Sometimes (Family assist)  Interpreter Needed?: No  Information entered by :: Theresa Mulligan LPN   Activities of Daily Living     04/15/2023    1:24 PM 04/13/2023    3:38 AM  In your present state of health, do you have any difficulty performing the following activities:  Hearing? 0 0  Vision? 0 0  Difficulty concentrating or making decisions? 1 1  Comment Family assist.  Followed by medical attention   Walking or climbing stairs? 1 1  Comment Uses a Health and safety inspector or bathing? 1 1  Comment Family assist   Doing errands, shopping? 1 1  Comment Family Advice worker and eating ? Malvin Johns  Comment Family assist   Using the Toilet? N N  In the past six months, have you accidently leaked urine? N Y  Do you have problems with loss of bowel control? N N  Managing your Medications? Malvin Johns  Comment Family assist   Managing your Finances? Malvin Johns  Comment Family assist   Housekeeping or managing your Housekeeping? Malvin Johns  Comment Family assist     Patient Care Team: [place], Betty G, MD as PCP - General (Family Medicine) [name], MD as PCP - Cardiology (Cardiology) Marlene Bast [facility])  Indicate any recent Medical Services you may have received from other than [facility] providers in the past year (date may be approximate).     Assessment:   This is a routine wellness examination for [facility].  Hearing/Vision screen Hearing Screening - Comments:: Denies hearing difficulties   Vision Screening - Comments:: Wears rx glasses - up to date with routine eye exams with  [facility]   Goals Addressed               This Visit's Progress     Remain Active (pt-stated)  Depression Screen     04/15/2023    1:33 PM 05/23/2022    1:58 PM 04/02/2022    2:32 PM 01/15/2022    1:51 PM 11/14/2021    4:08 PM 03/30/2021    2:13 PM 03/28/2020    2:15 PM  PHQ 2/9 Scores  PHQ - 2 Score 6 2 0 0 2 4 3   PHQ- 9 Score 24 9  2 9 6 12     Fall Risk     04/15/2023    1:28 PM 04/13/2023    3:38 AM 05/23/2022    1:57 PM 03/27/2022    3:08 PM 01/15/2022    1:51 PM  Fall Risk   Falls in the past year? 1 1 1 1  0  Number falls in past yr: 0 1 0 0 0  Injury with Fall? 0 0 0 0 0  Risk for fall due to : History of fall(s)  History of fall(s) No Fall Risks Other (Comment);History of fall(s)  Follow up Falls prevention discussed;Falls  evaluation completed  Falls evaluation completed Falls prevention discussed Falls evaluation completed    MEDICARE RISK AT HOME:  Medicare Risk at Home Any stairs in or around the home?: Yes If so, are there any without handrails?: No Home free of loose throw rugs in walkways, pet beds, electrical cords, etc?: Yes Adequate lighting in your home to reduce risk of falls?: Yes Life alert?: No Use of a cane, walker or w/c?: Yes Grab bars in the bathroom?: Yes Shower chair or bench in shower?: Yes Elevated toilet seat or a handicapped toilet?: Yes  TIMED UP AND GO:  Was the test performed?  No  Cognitive Function: 6CIT completed    11/24/2015    2:55 PM  MMSE - Mini Mental State Exam  Not completed: --        04/15/2023    1:30 PM 04/02/2022    2:38 PM 03/30/2021    2:30 PM  6CIT Screen  What Year? 0 points 0 points 0 points  What month? 0 points 0 points 0 points  What time? 0 points 0 points 0 points  Count back from 20 0 points 0 points 0 points  Months in reverse 0 points 0 points 0 points  Repeat phrase 0 points 0 points 0 points  Total Score 0 points 0 points 0 points    Immunizations Immunization History  Administered Date(s) Administered   Influenza, High Dose Seasonal PF 01/25/2021   Influenza,inj,Quad PF,6+ Mos 11/14/2021   PFIZER(Purple Top)SARS-COV-2 Vaccination 04/30/2019, 05/28/2019, 11/30/2019   Pneumococcal Polysaccharide-23 11/14/2021   Tdap 03/18/2014   Unspecified SARS-COV-2 Vaccination 05/30/2019, 06/30/2019, 12/30/2019    Screening Tests Health Maintenance  Topic Date Due   Diabetic kidney evaluation - Urine ACR  Never done   Zoster Vaccines- Shingrix (1 of 2) Never done   Colonoscopy  Never done   Cervical Cancer Screening (HPV/Pap Cotest)  10/26/2018   MAMMOGRAM  11/20/2019   OPHTHALMOLOGY EXAM  01/10/2021   COVID-19 Vaccine (7 - 2024-25 season) 09/30/2022   HEMOGLOBIN A1C  11/11/2022   Pneumococcal Vaccine 37-32 Years old (2 of 2 - PCV)  11/15/2022   FOOT EXAM  11/15/2022   INFLUENZA VACCINE  04/29/2023 (Originally 08/30/2022)   Diabetic kidney evaluation - eGFR measurement  05/23/2023   DTaP/Tdap/Td (2 - Td or Tdap) 03/18/2024   Medicare Annual Wellness (AWV)  04/14/2024   Hepatitis C Screening  Completed   HIV Screening  Completed   HPV VACCINES  Aged Out    Health Maintenance  Health Maintenance Due  Topic Date Due   Diabetic kidney evaluation - Urine ACR  Never done   Zoster Vaccines- Shingrix (1 of 2) Never done   Colonoscopy  Never done   Cervical Cancer Screening (HPV/Pap Cotest)  10/26/2018   MAMMOGRAM  11/20/2019   OPHTHALMOLOGY EXAM  01/10/2021   COVID-19 Vaccine (7 - 2024-25 season) 09/30/2022   HEMOGLOBIN A1C  11/11/2022   Pneumococcal Vaccine 20-45 Years old (2 of 2 - PCV) 11/15/2022   FOOT EXAM  11/15/2022   Health Maintenance Items Addressed: See Nurse Notes  Additional Screening:  Vision Screening: Recommended annual ophthalmology exams for early detection of glaucoma and other disorders of the eye.  Dental Screening: Recommended annual dental exams for proper oral hygiene  Community Resource Referral / Chronic Care Management:  CRR required this visit?  No   CCM required this visit?  No     Plan:     I have personally reviewed and noted the following in the patient's chart:   Medical and social history Use of alcohol, tobacco or illicit drugs  Current medications and supplements including opioid prescriptions. Patient is not currently taking opioid prescriptions. Functional ability and status Nutritional status Physical activity Advanced directives List of other physicians Hospitalizations, surgeries, and ER visits in previous 12 months Vitals Screenings to include cognitive, depression, and falls Referrals and appointments  In addition, I have reviewed and discussed with patient certain preventive protocols, quality metrics, and best practice recommendations. A written  personalized care plan for preventive services as well as general preventive health recommendations were provided to patient.     Tillie Rung, LPN   [id number]/[id number]   After Visit Summary: (MyChart) Due to this being a telephonic visit, the after visit summary with patients personalized plan was offered to patient via MyChart   Notes:  Patient  deferred Health Maintenance Also patient declined referrals for hardships. Patient currently receiving treatment for stress and depression Patient stated she has no thoughts or plans of suicide.

## 2023-04-16 ENCOUNTER — Ambulatory Visit: Payer: Medicare PPO | Admitting: Psychology

## 2023-04-16 DIAGNOSIS — F319 Bipolar disorder, unspecified: Secondary | ICD-10-CM | POA: Diagnosis not present

## 2023-04-16 NOTE — Progress Notes (Signed)
 [name] is a 53 y.o. female patient   Treatment Plan: Date: 04/16/2023  Diagnosis 296.80 (Unspecified bipolar or related disorder) [n/a]  Symptoms Depressed or irritable mood. (Status: maintained) -- No Description Entered  Diminished interest in or enjoyment of activities. (Status: maintained) -- No Description Entered  Lack of energy. (Status: maintained) -- No Description Entered  Low self-esteem. (Status: maintained) -- No Description Entered  Medication Status compliance  Safety none  If Suicidal or Homicidal State Action Taken: unspecified  Current Risk: low Medications Adderall (Dosage: 20mg  4X/day)  Klonapin (Dosage: 1mg  3X/day)  Lamictal (Dosage: 200mg  2x/day)  Nuedexta (Dosage: 20mg )  Vyralar (Dosage: 6mg )  Xanax (Dosage: 1mg  3X/day)  Objectives Related Problem: Develop healthy cognitive patterns and beliefs about self and the world that lead to alleviation and help prevent the relapse of mood episodes. Description: State no longer having thoughts of self-harm. Target Date: 2024-01-08 Frequency: Daily Modality: individual Progress: 80%  Related Problem: Develop healthy cognitive patterns and beliefs about self and the world that lead to alleviation and help prevent the relapse of mood episodes. Description: Verbalize any history of past and present suicidal thoughts and actions. Target Date: 2024-01-08 Frequency: Daily Modality: individual Progress: 80%  Related Problem: Develop healthy cognitive patterns and beliefs about self and the world that lead to alleviation and help prevent the relapse of mood episodes. Description: Take prescribed medications as directed. Target Date: 2024-01-08 Frequency: Daily Modality: individual Progress: 90%  Related Problem: Develop healthy cognitive patterns and beliefs about self and the world that lead to alleviation and help prevent the relapse of mood episodes. Description: Discuss and resolve troubling  personal and interpersonal issues. Target Date: 2024-01-08 Frequency: Daily Modality: individual Progress: 80% Resolve complicated grief       Target Date: 12-25       Progress: 30%  Client Response full compliance  Service Location Location, [address] [place], [place] [address] -virtual Service Code cpt [phone]  Emotion regulation skills  Related past to present  Rationally challenge thoughts or beliefs/cognitive restructuring  Identify/label emotions  Validate/empathize  Self care activities  Lifestyle change (exercise, nutrition)  Self-monitoring  Facilitate problem solving  Session notes:  f31.9 Bipolar Unspecified.  Goals: Mood stabilization, improved marital relationship, improve self esteem and elimination of self harm thoughts/tendencies. Also, patient struggles with leaving home comfortably and seeks to improve this ability. Target date for these goals is January, 2023. In addition, she needs to improve self care behaviors due to her weight and various medical conditions. Target date for this goal is Feb., 2022. Date revised to December, 2023 to allow time for behavioral implementation. Reports periodic outbursts of rage/anger. Seeks to minimize. Target date is December, 2023. Patient has realized improvement in all areas. Requests additional treatment to facilitate maintenance of progress. Goal date 12-25. Patient's husband died unexpectedly and she is suffering complicated grief. She needs additional counseling to address associated issues and manage feelings of loss, despair and anxiety. Goal date 12-25.  Meds: Vyralar (6mg ),Nuedexta (40+20mg /day), Adderall (20mg  3X/day), Xanax (1mg , 3X/day), and Klonopin (1mg  3X/day).   Patient agreed to a video session Public affairs consultant) and understands the limitations of the platform. She is at home and provider in his office.   Session note: [name] did contact Dr. Reginia Naas office and got an appointment. She agreed to have a consult  about her sleep as well. Indications of her desire to improve health and willingness to be more future oriented. She was having severe allergy reaction  during session and had several coughing "fits". She says it has been 10+ years since she saw an allergist. Suggested she call to make appointment. She did go out with friends over the weekend. Went out to dinner and back home to watch movie together. This has been helping combat some of the loneliness. Talked about the need to and the ways to remain with a positive outlook.                                                                                                                                                                                                 [name]                                      [name], PhD  Time:2:15p-3:00p 45 minutes.

## 2023-04-23 ENCOUNTER — Ambulatory Visit: Payer: Medicare PPO | Admitting: Psychology

## 2023-04-24 ENCOUNTER — Ambulatory Visit (INDEPENDENT_AMBULATORY_CARE_PROVIDER_SITE_OTHER): Admitting: Psychology

## 2023-04-24 DIAGNOSIS — F319 Bipolar disorder, unspecified: Secondary | ICD-10-CM | POA: Diagnosis not present

## 2023-04-24 DIAGNOSIS — F4381 Prolonged grief disorder: Secondary | ICD-10-CM

## 2023-04-24 NOTE — Progress Notes (Signed)
 [name] is a 53 y.o. female patient   Treatment Plan: Date: 04/24/2023  Diagnosis 296.80 (Unspecified bipolar or related disorder) [n/a]  Symptoms Depressed or irritable mood. (Status: maintained) -- No Description Entered  Diminished interest in or enjoyment of activities. (Status: maintained) -- No Description Entered  Lack of energy. (Status: maintained) -- No Description Entered  Low self-esteem. (Status: maintained) -- No Description Entered  Medication Status compliance  Safety none  If Suicidal or Homicidal State Action Taken: unspecified  Current Risk: low Medications Adderall (Dosage: 20mg  4X/day)  Klonapin (Dosage: 1mg  3X/day)  Lamictal (Dosage: 200mg  2x/day)  Nuedexta (Dosage: 20mg )  Vyralar (Dosage: 6mg )  Xanax (Dosage: 1mg  3X/day)  Objectives Related Problem: Develop healthy cognitive patterns and beliefs about self and the world that lead to alleviation and help prevent the relapse of mood episodes. Description: State no longer having thoughts of self-harm. Target Date: 2024-01-08 Frequency: Daily Modality: individual Progress: 80%  Related Problem: Develop healthy cognitive patterns and beliefs about self and the world that lead to alleviation and help prevent the relapse of mood episodes. Description: Verbalize any history of past and present suicidal thoughts and actions. Target Date: 2024-01-08 Frequency: Daily Modality: individual Progress: 80%  Related Problem: Develop healthy cognitive patterns and beliefs about self and the world that lead to alleviation and help prevent the relapse of mood episodes. Description: Take prescribed medications as directed. Target Date: 2024-01-08 Frequency: Daily Modality: individual Progress: 90%  Related Problem: Develop healthy cognitive patterns and beliefs about self and the world that lead to alleviation and help prevent the relapse of mood episodes. Description:  Discuss and resolve troubling personal and interpersonal issues. Target Date: 2024-01-08 Frequency: Daily Modality: individual Progress: 80% Resolve complicated grief       Target Date: 12-25       Progress: 30%  Client Response full compliance  Service Location Location, [address] [place], [place] [address] -virtual Service Code cpt [phone]  Emotion regulation skills  Related past to present  Rationally challenge thoughts or beliefs/cognitive restructuring  Identify/label emotions  Validate/empathize  Self care activities  Lifestyle change (exercise, nutrition)  Self-monitoring  Facilitate problem solving  Session notes:  f31.9 Bipolar Unspecified.  Goals: Mood stabilization, improved marital relationship, improve self esteem and elimination of self harm thoughts/tendencies. Also, patient struggles with leaving home comfortably and seeks to improve this ability. Target date for these goals is January, 2023. In addition, she needs to improve self care behaviors due to her weight and various medical conditions. Target date for this goal is Feb., 2022. Date revised to December, 2023 to allow time for behavioral implementation. Reports periodic outbursts of rage/anger. Seeks to minimize. Target date is December, 2023. Patient has realized improvement in all areas. Requests additional treatment to facilitate maintenance of progress. Goal date 12-25. Patient's husband died unexpectedly and she is suffering complicated grief. She needs additional counseling to address associated issues and manage feelings of loss, despair and anxiety. Goal date 12-25.  Meds: Vyralar (6mg ),Nuedexta (40+20mg /day), Adderall (20mg  3X/day), Xanax (1mg , 3X/day), and Klonopin (1mg  3X/day).   Patient agreed to be seen in provider S office.   Session note: [name] reports that she speaks to her (female) friend every day. She is now up all night every night and doesn't sleep until morning. I consulted with Salomon Fick, a sleep specialist, who agreed to meet with [facility]. Discussed this  with [facility] and she reluctantly agreed to meet with her to address her sleep difficulties. She talked about wanting to prepare herself for when her 48 year old dog passes. This will be very difficult for her. I suggested that it will be beneficial for her to consider another dog relative soon after her dog passes due to her situation. Is looking forward to seeing her friend [name] tonight.                                                                                                                                                                                                      [name], PhD  Time:3:15p-4:00p 45 minutes.

## 2023-04-30 ENCOUNTER — Ambulatory Visit (INDEPENDENT_AMBULATORY_CARE_PROVIDER_SITE_OTHER): Payer: Medicare PPO | Admitting: Psychology

## 2023-04-30 DIAGNOSIS — F319 Bipolar disorder, unspecified: Secondary | ICD-10-CM | POA: Diagnosis not present

## 2023-04-30 NOTE — Progress Notes (Signed)
 MACKENZIE GROOM is a 53 y.o. female patient   Treatment Plan: Date: 04/30/2023  Diagnosis 296.80 (Unspecified bipolar or related disorder) [n/a]  Symptoms Depressed or irritable mood. (Status: maintained) -- No Description Entered  Diminished interest in or enjoyment of activities. (Status: maintained) -- No Description Entered  Lack of energy. (Status: maintained) -- No Description Entered  Low self-esteem. (Status: maintained) -- No Description Entered  Medication Status compliance  Safety none  If Suicidal or Homicidal State Action Taken: unspecified  Current Risk: low Medications Adderall (Dosage: 20mg  4X/day)  Klonapin (Dosage: 1mg  3X/day)  Lamictal (Dosage: 200mg  2x/day)  Nuedexta (Dosage: 20mg )  Vyralar (Dosage: 6mg )  Xanax (Dosage: 1mg  3X/day)  Objectives Related Problem: Develop healthy cognitive patterns and beliefs about self and the world that lead to alleviation and help prevent the relapse of mood episodes. Description: State no longer having thoughts of self-harm. Target Date: 2024-01-08 Frequency: Daily Modality: individual Progress: 80%  Related Problem: Develop healthy cognitive patterns and beliefs about self and the world that lead to alleviation and help prevent the relapse of mood episodes. Description: Verbalize any history of past and present suicidal thoughts and actions. Target Date: 2024-01-08 Frequency: Daily Modality: individual Progress: 80%  Related Problem: Develop healthy cognitive patterns and beliefs about self and the world that lead to alleviation and help prevent the relapse of mood episodes. Description: Take prescribed medications as directed. Target Date: 2024-01-08 Frequency: Daily Modality: individual Progress: 90%  Related Problem: Develop healthy cognitive patterns and beliefs about self and the world that lead to alleviation and help prevent the relapse of  mood episodes. Description: Discuss and resolve troubling personal and interpersonal issues. Target Date: 2024-01-08 Frequency: Daily Modality: individual Progress: 80% Resolve complicated grief       Target Date: 12-25       Progress: 30%  Client Response full compliance  Service Location Location, [address] [place], [place] [address] -virtual Service Code cpt [phone]  Emotion regulation skills  Related past to present  Rationally challenge thoughts or beliefs/cognitive restructuring  Identify/label emotions  Validate/empathize  Self care activities  Lifestyle change (exercise, nutrition)  Self-monitoring  Facilitate problem solving  Session notes:  f31.9 Bipolar Unspecified.  Goals: Mood stabilization, improved marital relationship, improve self esteem and elimination of self harm thoughts/tendencies. Also, patient struggles with leaving home comfortably and seeks to improve this ability. Target date for these goals is January, 2023. In addition, she needs to improve self care behaviors due to her weight and various medical conditions. Target date for this goal is Feb., 2022. Date revised to December, 2023 to allow time for behavioral implementation. Reports periodic outbursts of rage/anger. Seeks to minimize. Target date is December, 2023. Patient has realized improvement in all areas. Requests additional treatment to facilitate maintenance of progress. Goal date 12-25. Patient's husband died unexpectedly and she is suffering complicated grief. She needs additional counseling to address associated issues and manage feelings of loss, despair and anxiety. Goal date 12-25.  Meds: Vyralar (6mg ),Nuedexta (40+20mg /day), Adderall (20mg  3X/day), Xanax (1mg , 3X/day), and Klonopin (1mg  3X/day).   Patient agreed to have a video Public affairs consultant) session. She is at home and provider in his home office.    Session note: Kassidie states there is no change in her sleep problem. She agrees to see  Terri  Bauert to help her with her sleep problem. Kodi did finally make appointment with a pulmonologist in June. Very positive that she took this step in self care. Will meet with Dr. Vassie Loll. Her wedding anniversary is tomorrow, and it highlights her sadness.  Brenlynn is also doing better about eating more nutritious food.                                                                                                                                                                                                          [name]                                      [name], PhD  Time:2:15p-3:00p 45 minutes.

## 2023-05-06 ENCOUNTER — Other Ambulatory Visit: Payer: Self-pay | Admitting: Cardiology

## 2023-05-07 ENCOUNTER — Ambulatory Visit: Payer: Medicare PPO | Admitting: Psychology

## 2023-05-07 NOTE — Telephone Encounter (Signed)
 Pt of Dr. Antoine Poche. She is overdue for her 5mo f/u. She has had 3 attempts. Does Dr. Antoine Poche want to refill? Please advise.

## 2023-05-08 ENCOUNTER — Ambulatory Visit: Admitting: Psychology

## 2023-05-08 ENCOUNTER — Encounter: Payer: Self-pay | Admitting: *Deleted

## 2023-05-08 DIAGNOSIS — F319 Bipolar disorder, unspecified: Secondary | ICD-10-CM | POA: Diagnosis not present

## 2023-05-08 NOTE — Progress Notes (Signed)
 [name] is a 53 y.o. female patient   Treatment Plan: Date: 05/08/2023  Diagnosis 296.80 (Unspecified bipolar or related disorder) [n/a]  Symptoms Depressed or irritable mood. (Status: maintained) -- No Description Entered  Diminished interest in or enjoyment of activities. (Status: maintained) -- No Description Entered  Lack of energy. (Status: maintained) -- No Description Entered  Low self-esteem. (Status: maintained) -- No Description Entered  Medication Status compliance  Safety none  If Suicidal or Homicidal State Action Taken: unspecified  Current Risk: low Medications Adderall (Dosage: 20mg  4X/day)  Klonapin (Dosage: 1mg  3X/day)  Lamictal (Dosage: 200mg  2x/day)  Nuedexta (Dosage: 20mg )  Vyralar (Dosage: 6mg )  Xanax (Dosage: 1mg  3X/day)  Objectives Related Problem: Develop healthy cognitive patterns and beliefs about self and the world that lead to alleviation and help prevent the relapse of mood episodes. Description: State no longer having thoughts of self-harm. Target Date: 2024-01-08 Frequency: Daily Modality: individual Progress: 80%  Related Problem: Develop healthy cognitive patterns and beliefs about self and the world that lead to alleviation and help prevent the relapse of mood episodes. Description: Verbalize any history of past and present suicidal thoughts and actions. Target Date: 2024-01-08 Frequency: Daily Modality: individual Progress: 80%  Related Problem: Develop healthy cognitive patterns and beliefs about self and the world that lead to alleviation and help prevent the relapse of mood episodes. Description: Take prescribed medications as directed. Target Date: 2024-01-08 Frequency: Daily Modality: individual Progress: 90%  Related Problem: Develop healthy cognitive patterns and beliefs about self and the world that lead to alleviation and  help prevent the relapse of mood episodes. Description: Discuss and resolve troubling personal and interpersonal issues. Target Date: 2024-01-08 Frequency: Daily Modality: individual Progress: 80% Resolve complicated grief       Target Date: 12-25       Progress: 30%  Client Response full compliance  Service Location Location, [address] [place], [place] [address] -virtual Service Code cpt [phone]  Emotion regulation skills  Related past to present  Rationally challenge thoughts or beliefs/cognitive restructuring  Identify/label emotions  Validate/empathize  Self care activities  Lifestyle change (exercise, nutrition)  Self-monitoring  Facilitate problem solving  Session notes:  f31.9 Bipolar Unspecified.  Goals: Mood stabilization, improved marital relationship, improve self esteem and elimination of self harm thoughts/tendencies. Also, patient struggles with leaving home comfortably and seeks to improve this ability. Target date for these goals is January, 2023. In addition, she needs to improve self care behaviors due to her weight and various medical conditions. Target date for this goal is Feb., 2022. Date revised to December, 2023 to allow time for behavioral implementation. Reports periodic outbursts of rage/anger. Seeks to minimize. Target date is December, 2023. Patient has realized improvement in all areas. Requests additional treatment to facilitate maintenance of progress. Goal date 12-25. Patient's husband died unexpectedly and she is suffering complicated grief. She needs additional counseling to address associated issues and manage feelings of loss, despair and anxiety. Goal date 12-25.  Meds: Vyralar (6mg ),Nuedexta (40+20mg /day), Adderall (20mg  3X/day), Xanax (1mg , 3X/day), and Klonopin (1mg  3X/day).   Patient agreed to have a video Public affairs consultant) session. She is at home and provider in his home office.    Session note: [name]  states she just came from the dentist  and they told her she grinds her teeth. Says she will start using a guard. She got together with her friend TC and they went to dinner and watched TV. She is scheduled to meet with Salomon Fick to address her sleep issues. She is still staying up all night and sleeping during the day. We discussed all of the medical appointments she is avoiding, including GI, hand, foot, eye,ObGyn, mamogram, and cardiology (for her Statins). She is already scheduled with Pulmonary and saw her dentist. She states that she "hates her life". Discussed skill sets that she could share with others and those things that could bring potential joy.                                                                                                                                                                                                                [name]                                      [name], PhD  Time:3:15p-4:00p 45 minutes.

## 2023-05-14 ENCOUNTER — Ambulatory Visit: Payer: Medicare PPO | Admitting: Psychology

## 2023-05-15 ENCOUNTER — Ambulatory Visit (INDEPENDENT_AMBULATORY_CARE_PROVIDER_SITE_OTHER): Payer: Medicare PPO | Admitting: Psychology

## 2023-05-15 DIAGNOSIS — F319 Bipolar disorder, unspecified: Secondary | ICD-10-CM

## 2023-05-15 NOTE — Progress Notes (Signed)
 [name] is a 53 y.o. female patient   Treatment Plan: Date: 05/15/2023  Diagnosis 296.80 (Unspecified bipolar or related disorder) [n/a]  Symptoms Depressed or irritable mood. (Status: maintained) -- No Description Entered  Diminished interest in or enjoyment of activities. (Status: maintained) -- No Description Entered  Lack of energy. (Status: maintained) -- No Description Entered  Low self-esteem. (Status: maintained) -- No Description Entered  Medication Status compliance  Safety none  If Suicidal or Homicidal State Action Taken: unspecified  Current Risk: low Medications Adderall (Dosage: 20mg  4X/day)  Klonapin (Dosage: 1mg  3X/day)  Lamictal (Dosage: 200mg  2x/day)  Nuedexta (Dosage: 20mg )  Vyralar (Dosage: 6mg )  Xanax (Dosage: 1mg  3X/day)  Objectives Related Problem: Develop healthy cognitive patterns and beliefs about self and the world that lead to alleviation and help prevent the relapse of mood episodes. Description: State no longer having thoughts of self-harm. Target Date: 2024-01-08 Frequency: Daily Modality: individual Progress: 80%  Related Problem: Develop healthy cognitive patterns and beliefs about self and the world that lead to alleviation and help prevent the relapse of mood episodes. Description: Verbalize any history of past and present suicidal thoughts and actions. Target Date: 2024-01-08 Frequency: Daily Modality: individual Progress: 80%  Related Problem: Develop healthy cognitive patterns and beliefs about self and the world that lead to alleviation and help prevent the relapse of mood episodes. Description: Take prescribed medications as directed. Target Date: 2024-01-08 Frequency: Daily Modality: individual Progress: 90%  Related Problem: Develop healthy cognitive patterns and beliefs about self and the  world that lead to alleviation and help prevent the relapse of mood episodes. Description: Discuss and resolve troubling personal and interpersonal issues. Target Date: 2024-01-08 Frequency: Daily Modality: individual Progress: 80% Resolve complicated grief       Target Date: 12-25       Progress: 30%  Client Response full compliance  Service Location Location, [address] [place], [place] [address] -virtual Service Code cpt [phone]  Emotion regulation skills  Related past to present  Rationally challenge thoughts or beliefs/cognitive restructuring  Identify/label emotions  Validate/empathize  Self care activities  Lifestyle change (exercise, nutrition)  Self-monitoring  Facilitate problem solving  Session notes:  f31.9 Bipolar Unspecified.  Goals: Mood stabilization, improved marital relationship, improve self esteem and elimination of self harm thoughts/tendencies. Also, patient struggles with leaving home comfortably and seeks to improve this ability. Target date for these goals is January, 2023. In addition, she needs to improve self care behaviors due to her weight and various medical conditions. Target date for this goal is Feb., 2022. Date revised to December, 2023 to allow time for behavioral implementation. Reports periodic outbursts of rage/anger. Seeks to minimize. Target date is December, 2023. Patient has realized improvement in all areas. Requests additional treatment to facilitate maintenance of progress. Goal date 12-25. Patient's husband died unexpectedly and she is suffering complicated grief. She needs additional counseling to address associated issues and manage feelings of loss, despair and anxiety. Goal date 12-25.  Meds: Vyralar (6mg ),Nuedexta (40+20mg /day), Adderall (20mg  3X/day), Xanax (1mg , 3X/day), and Klonopin (1mg  3X/day).   Patient was seen face to face in provider's office.  Session note: [name] states she continues to stay up all night and will  sleep in morning into early afternoon. She had a dream about [name] coming back. She chose not to go with her father to the church yearly service.She didn't want to go because she doesn't worship there and she is an Engineer, water. She says that she is considering taking [name]'s pension in a lmp sum in order to buy a new car. Has thought this through and feels it is her best option. [name] appears more depressed today in that she is less interactive. States she is compliant with her medication.                                                                                                                                                                                                                   Aaron Aas                                      Jola Nash, PhD  Time:2:15p-3:00p 45 minutes.

## 2023-05-16 NOTE — Telephone Encounter (Signed)
 Called patient she stated she already has losartan prescription.Follow up appointment scheduled with Dr.[name] 5/2 at 1:20 pm at new office,directions given.

## 2023-05-21 ENCOUNTER — Ambulatory Visit: Payer: Medicare PPO | Admitting: Psychology

## 2023-05-22 ENCOUNTER — Ambulatory Visit (INDEPENDENT_AMBULATORY_CARE_PROVIDER_SITE_OTHER): Admitting: Psychology

## 2023-05-22 DIAGNOSIS — F319 Bipolar disorder, unspecified: Secondary | ICD-10-CM | POA: Diagnosis not present

## 2023-05-22 NOTE — Progress Notes (Signed)
 [name] is a 53 y.o. female patient   Treatment Plan: Date: 05/22/2023  Diagnosis 296.80 (Unspecified bipolar or related disorder) [n/a]  Symptoms Depressed or irritable mood. (Status: maintained) -- No Description Entered  Diminished interest in or enjoyment of activities. (Status: maintained) -- No Description Entered  Lack of energy. (Status: maintained) -- No Description Entered  Low self-esteem. (Status: maintained) -- No Description Entered  Medication Status compliance  Safety none  If Suicidal or Homicidal State Action Taken: unspecified  Current Risk: low Medications Adderall (Dosage: 20mg  4X/day)  Klonapin (Dosage: 1mg  3X/day)  Lamictal  (Dosage: 200mg  2x/day)  Nuedexta  (Dosage: 20mg )  Vyralar (Dosage: 6mg )  Xanax  (Dosage: 1mg  3X/day)  Objectives Related Problem: Develop healthy cognitive patterns and beliefs about self and the world that lead to alleviation and help prevent the relapse of mood episodes. Description: State no longer having thoughts of self-harm. Target Date: 2024-01-08 Frequency: Daily Modality: individual Progress: 80%  Related Problem: Develop healthy cognitive patterns and beliefs about self and the world that lead to alleviation and help prevent the relapse of mood episodes. Description: Verbalize any history of past and present suicidal thoughts and actions. Target Date: 2024-01-08 Frequency: Daily Modality: individual Progress: 80%  Related Problem: Develop healthy cognitive patterns and beliefs about self and the world that lead to alleviation and help prevent the relapse of mood episodes. Description: Take prescribed medications as directed. Target Date: 2024-01-08 Frequency: Daily Modality: individual Progress: 90%  Related Problem: Develop healthy cognitive patterns and  beliefs about self and the world that lead to alleviation and help prevent the relapse of mood episodes. Description: Discuss and resolve troubling personal and interpersonal issues. Target Date: 2024-01-08 Frequency: Daily Modality: individual Progress: 80% Resolve complicated grief       Target Date: 12-25       Progress: 30%  Client Response full compliance  Service Location Location, [address] [place], [place] [address] -virtual Service Code cpt [phone]  Emotion regulation skills  Related past to present  Rationally challenge thoughts or beliefs/cognitive restructuring  Identify/label emotions  Validate/empathize  Self care activities  Lifestyle change (exercise, nutrition)  Self-monitoring  Facilitate problem solving  Session notes:  f31.9 Bipolar Unspecified.  Goals: Mood stabilization, improved marital relationship, improve self esteem and elimination of self harm thoughts/tendencies. Also, patient struggles with leaving home comfortably and seeks to improve this ability. Target date for these goals is January, 2023. In addition, she needs to improve self care behaviors due to her weight and various medical conditions. Target date for this goal is Feb., 2022. Date revised to December, 2023 to allow time for behavioral implementation. Reports periodic outbursts of rage/anger. Seeks to minimize. Target date is December, 2023. Patient has realized improvement in all areas. Requests additional treatment to facilitate maintenance of progress. Goal date 12-25. Patient's husband died unexpectedly and she is suffering complicated grief. She needs additional counseling to address associated issues and manage feelings of loss, despair and anxiety. Goal date 12-25.  Meds: Vyralar (6mg ),Nuedexta  (40+20mg /day), Adderall (20mg  3X/day), Xanax  (1mg , 3X/day),  and Klonopin  (1mg  3X/day).   Patient was seen face to face in provider's office.    Session note: Haili states she has not slept  in 2 days. Reviewed sleep hygiene and she reports she has done all of the "right things". Will try to sleep this afternoon. She said that she is still unable to bathe herself due to her drain. It has not been looked at by a medical professional since last October. Encouraged her (strongly) to get to a doctor to have it examined. She is reluctant to see a doctor for any reason, with no concrete explanation. Becomes sarcastic and defensive when confronted with the issue. Refuses to consider secondary gain in not addressing her healthcare. She did, however, schedule appointments with cardiology and Pulmonary. Also has appointment with a sleep specialist that I arranged.                                                                                                                                                                                                                       Aaron Aas                                      Jola Nash, PhD  Time:3:15p-4:00p 45 minutes.

## 2023-05-28 ENCOUNTER — Ambulatory Visit: Payer: Medicare PPO | Admitting: Psychology

## 2023-05-29 ENCOUNTER — Ambulatory Visit: Payer: Medicare PPO | Admitting: Psychology

## 2023-05-30 ENCOUNTER — Encounter (HOSPITAL_BASED_OUTPATIENT_CLINIC_OR_DEPARTMENT_OTHER): Payer: Self-pay

## 2023-05-30 ENCOUNTER — Encounter (HOSPITAL_BASED_OUTPATIENT_CLINIC_OR_DEPARTMENT_OTHER): Payer: Self-pay | Admitting: Pulmonary Disease

## 2023-05-30 NOTE — Progress Notes (Deleted)
  Cardiology Office Note:   Date:  05/30/2023  ID:  [name], DOB August 11, 1970, MRN [id number] PCP: [place], Betty G, MD  [facility] Providers Cardiologist:  Eilleen Grates, MD {  History of Present Illness:   [name] is a 53 y.o. female  who presents for evaluation with mildly reduced ejection fraction although in 2024 EF it was well preserved. ***   ***    She has had some mildly elevated pulmonary pressures as well.  Since I last saw her she has done well.  She has been off the oxygen  for a couple of years.  She does not get out of the house much with her agoraphobia and anxiety.  However, she does her activities of daily living.  She denies any chest pressure, neck or arm discomfort.  She has no shortness of breath, PND or orthopnea.  She has no palpitations, presyncope or syncope.   ROS: ***  Studies Reviewed:    EKG:       ***  Risk Assessment/Calculations:   {Does this patient have ATRIAL FIBRILLATION?:[phone]} No BP recorded.  {Refresh Note OR Click here to enter BP  :1}***        Physical Exam:   VS:  There were no vitals taken for this visit.   Wt Readings from Last 3 Encounters:  04/15/23 255 lb (115.7 kg)  08/08/22 255 lb (115.7 kg)  05/23/22 254 lb 2 oz (115.3 kg)     GEN: Well nourished, well developed in no acute distress NECK: No JVD; No carotid bruits CARDIAC: ***RR, *** murmurs, rubs, gallops RESPIRATORY:  Clear to auscultation without rales, wheezing or rhonchi  ABDOMEN: Soft, non-tender, non-distended EXTREMITIES:  No edema; No deformity   ASSESSMENT AND PLAN:   Cardiomyopathy - Her ***  ejection fractio in February of last year was back to normal.  There was no significant pulmonary hypertension.   No change in therapy.    Pulmonary HTN - ***  We will follow this clinically and with repeat echocardiogram as indicated.    HTN -  Her blood pressure is ***  well controlled at home by her report.  It is elevated today  because she just had some stressful news.  She can watch this at home.    Sleep apnea-  ***  She uses her CPAP.  No change in therapy.         Follow up ***  Signed, Eilleen Grates, MD

## 2023-05-31 ENCOUNTER — Ambulatory Visit: Admitting: Cardiology

## 2023-05-31 DIAGNOSIS — I10 Essential (primary) hypertension: Secondary | ICD-10-CM

## 2023-05-31 DIAGNOSIS — I272 Pulmonary hypertension, unspecified: Secondary | ICD-10-CM

## 2023-05-31 DIAGNOSIS — I420 Dilated cardiomyopathy: Secondary | ICD-10-CM

## 2023-05-31 DIAGNOSIS — G4733 Obstructive sleep apnea (adult) (pediatric): Secondary | ICD-10-CM

## 2023-06-04 ENCOUNTER — Ambulatory Visit: Payer: Medicare PPO | Admitting: Psychology

## 2023-06-05 ENCOUNTER — Ambulatory Visit (INDEPENDENT_AMBULATORY_CARE_PROVIDER_SITE_OTHER): Admitting: Psychology

## 2023-06-05 DIAGNOSIS — F319 Bipolar disorder, unspecified: Secondary | ICD-10-CM

## 2023-06-05 NOTE — Progress Notes (Signed)
 [name] is a 53 y.o. female patient   Treatment Plan: Date: 06/05/2023  Diagnosis 296.80 (Unspecified bipolar or related disorder) [n/a]  Symptoms Depressed or irritable mood. (Status: maintained) -- No Description Entered  Diminished interest in or enjoyment of activities. (Status: maintained) -- No Description Entered  Lack of energy. (Status: maintained) -- No Description Entered  Low self-esteem. (Status: maintained) -- No Description Entered  Medication Status compliance  Safety none  If Suicidal or Homicidal State Action Taken: unspecified  Current Risk: low Medications Adderall (Dosage: 20mg  4X/day)  Klonapin (Dosage: 1mg  3X/day)  Lamictal  (Dosage: 200mg  2x/day)  Nuedexta  (Dosage: 20mg )  Vyralar (Dosage: 6mg )  Xanax  (Dosage: 1mg  3X/day)  Objectives Related Problem: Develop healthy cognitive patterns and beliefs about self and the world that lead to alleviation and help prevent the relapse of mood episodes. Description: State no longer having thoughts of self-harm. Target Date: 2024-01-08 Frequency: Daily Modality: individual Progress: 80%  Related Problem: Develop healthy cognitive patterns and beliefs about self and the world that lead to alleviation and help prevent the relapse of mood episodes. Description: Verbalize any history of past and present suicidal thoughts and actions. Target Date: 2024-01-08 Frequency: Daily Modality: individual Progress: 80%  Related Problem: Develop healthy cognitive patterns and beliefs about self and the world that lead to alleviation and help prevent the relapse of mood episodes. Description: Take prescribed medications as directed. Target Date: 2024-01-08 Frequency: Daily Modality: individual Progress: 90%  Related Problem: Develop  healthy cognitive patterns and beliefs about self and the world that lead to alleviation and help prevent the relapse of mood episodes. Description: Discuss and resolve troubling personal and interpersonal issues. Target Date: 2024-01-08 Frequency: Daily Modality: individual Progress: 80% Resolve complicated grief       Target Date: 12-25       Progress: 30%  Client Response full compliance  Service Location Location, [address] [place], [place] [address] -virtual Service Code cpt [phone]  Emotion regulation skills  Related past to present  Rationally challenge thoughts or beliefs/cognitive restructuring  Identify/label emotions  Validate/empathize  Self care activities  Lifestyle change (exercise, nutrition)  Self-monitoring  Facilitate problem solving  Session notes:  f31.9 Bipolar Unspecified.  Goals: Mood stabilization, improved marital relationship, improve self esteem and elimination of self harm thoughts/tendencies. Also, patient struggles with leaving home comfortably and seeks to improve this ability. Target date for these goals is January, 2023. In addition, she needs to improve self care behaviors due to her weight and various medical conditions. Target date for this goal is Feb., 2022. Date revised to December, 2023 to allow time for behavioral implementation. Reports periodic outbursts of rage/anger. Seeks to minimize. Target date is December, 2023. Patient has realized improvement in all areas. Requests additional treatment to facilitate maintenance of progress. Goal date 12-25. Patient's husband died unexpectedly and she is suffering complicated grief. She needs additional counseling to address associated issues and manage feelings of loss, despair and  anxiety. Goal date 12-25.  Meds: Vyralar (6mg ),Nuedexta  (40+20mg /day), Adderall (20mg  3X/day), Xanax  (1mg , 3X/day), and Klonopin  (1mg  3X/day).   Patient was seen face to face in provider's office.    Session note:  [name] says that her sleep disturbance is as bad as ever. She is sleeping off and on in the mornings. She went with her friend [name] to see her dad. [name] hasn't seen him in three months but speaks to him multiple time each day. Had a good visit but says it is hard for her to go given that her mother is gone. She also struggles to see her father feeling sad. Also, her friend [name] came over and grilled out for her and stayed for dinner. They had a nice time together. Her moods are improved and she is more animated.                                                                                                                                                                                                                          Aaron Aas                                      Jola Nash, PhD  Time:3:15p-4:00p 45 minutes.

## 2023-06-11 ENCOUNTER — Ambulatory Visit: Payer: Medicare PPO | Admitting: Psychology

## 2023-06-11 NOTE — Progress Notes (Unsigned)
 [name] is a 53 y.o. female patient   Treatment Plan: Date: 06/11/2023  Diagnosis 296.80 (Unspecified bipolar or related disorder) [n/a]  Symptoms Depressed or irritable mood. (Status: maintained) -- No Description Entered  Diminished interest in or enjoyment of activities. (Status: maintained) -- No Description Entered  Lack of energy. (Status: maintained) -- No Description Entered  Low self-esteem. (Status: maintained) -- No Description Entered  Medication Status compliance  Safety none  If Suicidal or Homicidal State Action Taken: unspecified  Current Risk: low Medications Adderall (Dosage: 20mg  4X/day)  Klonapin (Dosage: 1mg  3X/day)  Lamictal  (Dosage: 200mg  2x/day)  Nuedexta  (Dosage: 20mg )  Vyralar (Dosage: 6mg )  Xanax  (Dosage: 1mg  3X/day)  Objectives Related Problem: Develop healthy cognitive patterns and beliefs about self and the world that lead to alleviation and help prevent the relapse of mood episodes. Description: State no longer having thoughts of self-harm. Target Date: 2024-01-08 Frequency: Daily Modality: individual Progress: 80%  Related Problem: Develop healthy cognitive patterns and beliefs about self and the world that lead to alleviation and help prevent the relapse of mood episodes. Description: Verbalize any history of past and present suicidal thoughts and actions. Target Date: 2024-01-08 Frequency: Daily Modality: individual Progress: 80%  Related Problem: Develop healthy cognitive patterns and beliefs about self and the world that lead to alleviation and help prevent the relapse of mood episodes. Description: Take prescribed medications as directed. Target Date: 2024-01-08 Frequency: Daily Modality: individual Progress:  90%  Related Problem: Develop healthy cognitive patterns and beliefs about self and the world that lead to alleviation and help prevent the relapse of mood episodes. Description: Discuss and resolve troubling personal and interpersonal issues. Target Date: 2024-01-08 Frequency: Daily Modality: individual Progress: 80% Resolve complicated grief       Target Date: 12-25       Progress: 30%  Client Response full compliance  Service Location Location, [address] [place], [place] [address] -virtual Service Code cpt [phone]  Emotion regulation skills  Related past to present  Rationally challenge thoughts or beliefs/cognitive restructuring  Identify/label emotions  Validate/empathize  Self care activities  Lifestyle change (exercise, nutrition)  Self-monitoring  Facilitate problem solving  Session notes:  f31.9 Bipolar Unspecified.  Goals: Mood stabilization, improved marital relationship, improve self esteem and elimination of self harm thoughts/tendencies. Also, patient struggles with leaving home comfortably and seeks to improve this ability. Target date for these goals is January, 2023. In addition, she needs to improve self care behaviors due to her weight and various medical conditions. Target date for this goal is Feb., 2022. Date revised to December, 2023 to allow time for behavioral implementation. Reports periodic outbursts of rage/anger. Seeks to minimize. Target date is December, 2023. Patient has realized improvement in all areas. Requests additional treatment to facilitate maintenance of progress. Goal date 12-25. Patient's husband died unexpectedly and she is suffering complicated grief.  She needs additional counseling to address associated issues and manage feelings of loss, despair and anxiety. Goal date 12-25.  Meds: Vyralar (6mg ),Nuedexta  (40+20mg /day), Adderall (20mg  3X/day), Xanax  (1mg , 3X/day), and Klonopin  (1mg  3X/day).   Patient was seen face to face in  provider's office.    Session note: Morning                                                                                                                                                                                                                           .                                      Jola Nash, PhD  Time:2:15p-3:00p 45 minutes.

## 2023-06-18 ENCOUNTER — Ambulatory Visit: Payer: Medicare PPO | Admitting: Psychology

## 2023-06-19 ENCOUNTER — Ambulatory Visit (INDEPENDENT_AMBULATORY_CARE_PROVIDER_SITE_OTHER): Admitting: Psychology

## 2023-06-19 DIAGNOSIS — F319 Bipolar disorder, unspecified: Secondary | ICD-10-CM | POA: Diagnosis not present

## 2023-06-19 NOTE — Progress Notes (Signed)
 [name] is a 53 y.o. female patient   Treatment Plan: Date: 06/19/2023  Diagnosis 296.80 (Unspecified bipolar or related disorder) [n/a]  Symptoms Depressed or irritable mood. (Status: maintained) -- No Description Entered  Diminished interest in or enjoyment of activities. (Status: maintained) -- No Description Entered  Lack of energy. (Status: maintained) -- No Description Entered  Low self-esteem. (Status: maintained) -- No Description Entered  Medication Status compliance  Safety none  If Suicidal or Homicidal State Action Taken: unspecified  Current Risk: low Medications Adderall (Dosage: 20mg  4X/day)  Klonapin (Dosage: 1mg  3X/day)  Lamictal  (Dosage: 200mg  2x/day)  Nuedexta  (Dosage: 20mg )  Vyralar (Dosage: 6mg )  Xanax  (Dosage: 1mg  3X/day)  Objectives Related Problem: Develop healthy cognitive patterns and beliefs about self and the world that lead to alleviation and help prevent the relapse of mood episodes. Description: State no longer having thoughts of self-harm. Target Date: 2024-01-08 Frequency: Daily Modality: individual Progress: 80%  Related Problem: Develop healthy cognitive patterns and beliefs about self and the world that lead to alleviation and help prevent the relapse of mood episodes. Description: Verbalize any history of past and present suicidal thoughts and actions. Target Date: 2024-01-08 Frequency: Daily Modality: individual Progress: 80%  Related Problem: Develop healthy cognitive patterns and beliefs about self and the world that lead to alleviation and help prevent the relapse of mood episodes. Description: Take prescribed medications as directed. Target Date: 2024-01-08 Frequency:  Daily Modality: individual Progress: 90%  Related Problem: Develop healthy cognitive patterns and beliefs about self and the world that lead to alleviation and help prevent the relapse of mood episodes. Description: Discuss and resolve troubling personal and interpersonal issues. Target Date: 2024-01-08 Frequency: Daily Modality: individual Progress: 80% Resolve complicated grief       Target Date: 12-25       Progress: 30%  Client Response full compliance  Service Location Location, [address] [place], [place] [address] -virtual Service Code cpt [phone]  Emotion regulation skills  Related past to present  Rationally challenge thoughts or beliefs/cognitive restructuring  Identify/label emotions  Validate/empathize  Self care activities  Lifestyle change (exercise, nutrition)  Self-monitoring  Facilitate problem solving  Session notes:  f31.9 Bipolar Unspecified.  Goals: Mood stabilization, improved marital relationship, improve self esteem and elimination of self harm thoughts/tendencies. Also, patient struggles with leaving home comfortably and seeks to improve this ability. Target date for these goals is January, 2023. In addition, she needs to improve self care behaviors due to her weight and various medical conditions. Target date for this goal is Feb., 2022. Date revised to December, 2023 to allow time for behavioral implementation. Reports periodic outbursts of rage/anger. Seeks to minimize. Target date is December, 2023. Patient has realized improvement in all areas. Requests additional treatment to facilitate maintenance  of progress. Goal date 12-25. Patient's husband died unexpectedly and she is suffering complicated grief. She needs additional counseling to address associated issues and manage feelings of loss, despair and anxiety. Goal date 12-25.  Meds: Vyralar (6mg ),Nuedexta  (40+20mg /day), Adderall (20mg  3X/day), Xanax  (1mg , 3X/day), and Klonopin  (1mg  3X/day).    Patient was seen face to face in provider's office.    Session note: [name]'s sleep problems continue. Sleep is sporadic and inconsistent. She spoke with her dad today who told her that they both lost their spouses because they were not good enough companions. [name] feels she was inadequate as a spouse to [name] and he stayed out of obligation. [name] asked her father why he lives and he said that it it is to take care of her flowers. He told [name] she needs to find a purpose and she told him she has no desire. [name] says she does not believe that she is being punished for being a bad spouse. She does say that she feels she was not as good to him as he was to her. She still reports thoughts of not living, but not expressing any plan/intent. Says that there is a part of her that fears death.                                      Jessica Nash, PhD  Time:3:15p-4:00p 45 minutes.

## 2023-06-25 ENCOUNTER — Ambulatory Visit (INDEPENDENT_AMBULATORY_CARE_PROVIDER_SITE_OTHER): Admitting: Psychology

## 2023-06-25 DIAGNOSIS — F319 Bipolar disorder, unspecified: Secondary | ICD-10-CM

## 2023-06-25 NOTE — Progress Notes (Signed)
 [name] is a 53 y.o. female patient   Treatment Plan: Date: 06/25/2023  Diagnosis 296.80 (Unspecified bipolar or related disorder) [n/a]  Symptoms Depressed or irritable mood. (Status: maintained) -- No Description Entered  Diminished interest in or enjoyment of activities. (Status: maintained) -- No Description Entered  Lack of energy. (Status: maintained) -- No Description Entered  Low self-esteem. (Status: maintained) -- No Description Entered  Medication Status compliance  Safety none  If Suicidal or Homicidal State Action Taken: unspecified  Current Risk: low Medications Adderall (Dosage: 20mg  4X/day)  Klonapin (Dosage: 1mg  3X/day)  Lamictal  (Dosage: 200mg  2x/day)  Nuedexta  (Dosage: 20mg )  Vyralar (Dosage: 6mg )  Xanax  (Dosage: 1mg  3X/day)  Objectives Related Problem: Develop healthy cognitive patterns and beliefs about self and the world that lead to alleviation and help prevent the relapse of mood episodes. Description: State no longer having thoughts of self-harm. Target Date: 2024-01-08 Frequency: Daily Modality: individual Progress: 80%  Related Problem: Develop healthy cognitive patterns and beliefs about self and the world that lead to alleviation and help prevent the relapse of mood episodes. Description: Verbalize any history of past and present suicidal thoughts and actions. Target Date: 2024-01-08 Frequency: Daily Modality: individual Progress: 80%  Related Problem: Develop healthy cognitive patterns and beliefs about self and the world that lead to alleviation and help prevent the relapse of mood episodes. Description: Take prescribed medications as directed. Target Date:  2024-01-08 Frequency: Daily Modality: individual Progress: 90%  Related Problem: Develop healthy cognitive patterns and beliefs about self and the world that lead to alleviation and help prevent the relapse of mood episodes. Description: Discuss and resolve troubling personal and interpersonal issues. Target Date: 2024-01-08 Frequency: Daily Modality: individual Progress: 80% Resolve complicated grief       Target Date: 12-25       Progress: 30%  Client Response full compliance  Service Location Location, [address] [place], [place] [address] -virtual Service Code cpt [phone]  Emotion regulation skills  Related past to present  Rationally challenge thoughts or beliefs/cognitive restructuring  Identify/label emotions  Validate/empathize  Self care activities  Lifestyle change (exercise, nutrition)  Self-monitoring  Facilitate problem solving  Session notes:  f31.9 Bipolar Unspecified.  Goals: Mood stabilization, improved marital relationship, improve self esteem and elimination of self harm thoughts/tendencies. Also, patient struggles with leaving home comfortably and seeks to improve this ability. Target date for these goals is January, 2023. In addition, she needs to improve self care behaviors due to her weight and various medical conditions. Target date for this goal is Feb., 2022. Date revised to December, 2023 to allow time for behavioral implementation. Reports periodic outbursts of rage/anger. Seeks to minimize. Target date is  December, 2023. Patient has realized improvement in all areas. Requests additional treatment to facilitate maintenance of progress. Goal date 12-25. Patient's husband died unexpectedly and she is suffering complicated grief. She needs additional counseling to address associated issues and manage feelings of loss, despair and anxiety. Goal date 12-25.  Meds: Vyralar (6mg ),Nuedexta  (40+20mg /day), Adderall (20mg  3X/day), Xanax  (1mg , 3X/day), and  Klonopin  (1mg  3X/day).   Patient was seen face to face in provider's office.    Session note: [name]'s pulmonary appointment was cancelled by the doctor's office and she has not rescheduled. Says she will get it scheduled. She says that she has a number of other appointments she has to schedule as well. She did sleep for two nights consecutively after taking "lots of meds". These are the first times she has sleep a full night without interruption. Know she cannot rely on that method to sleep. Will continue to work on sleep hygiene as she awaits appointment with [name].                                          Jola Nash, PhD  Time:2:15p-3:00p 45 minutes.

## 2023-06-28 ENCOUNTER — Telehealth: Payer: Self-pay

## 2023-06-28 NOTE — Progress Notes (Signed)
   06/28/2023  Patient ID: [name], female   DOB: [birth year], 53 y.o.   MRN: [id number]  Pharmacy Quality Measure Review  This patient is appearing on a report for being at risk of failing the adherence measure for hypertension (ACEi/ARB) medications this calendar year.   Medication: Losartan  25mg  Last fill date: 02/22/23 for 90 day supply  Left voicemail for patient to return my call at their convenience.  Carnell Christian, PharmD Clinical Pharmacist [phone]

## 2023-07-02 ENCOUNTER — Ambulatory Visit (HOSPITAL_BASED_OUTPATIENT_CLINIC_OR_DEPARTMENT_OTHER): Admitting: Pulmonary Disease

## 2023-07-03 ENCOUNTER — Ambulatory Visit (HOSPITAL_BASED_OUTPATIENT_CLINIC_OR_DEPARTMENT_OTHER): Admitting: Pulmonary Disease

## 2023-07-03 ENCOUNTER — Ambulatory Visit (INDEPENDENT_AMBULATORY_CARE_PROVIDER_SITE_OTHER): Admitting: Psychology

## 2023-07-03 DIAGNOSIS — F319 Bipolar disorder, unspecified: Secondary | ICD-10-CM

## 2023-07-03 NOTE — Progress Notes (Signed)
 [name] is a 53 y.o. female patient   Treatment Plan: Date: 07/03/2023  Diagnosis 296.80 (Unspecified bipolar or related disorder) [n/a]  Symptoms Depressed or irritable mood. (Status: maintained) -- No Description Entered  Diminished interest in or enjoyment of activities. (Status: maintained) -- No Description Entered  Lack of energy. (Status: maintained) -- No Description Entered  Low self-esteem. (Status: maintained) -- No Description Entered  Medication Status compliance  Safety none  If Suicidal or Homicidal State Action Taken: unspecified  Current Risk: low Medications Adderall (Dosage: 20mg  4X/day)  Klonapin (Dosage: 1mg  3X/day)  Lamictal  (Dosage: 200mg  2x/day)  Nuedexta  (Dosage: 20mg )  Vyralar (Dosage: 6mg )  Xanax  (Dosage: 1mg  3X/day)  Objectives Related Problem: Develop healthy cognitive patterns and beliefs about self and the world that lead to alleviation and help prevent the relapse of mood episodes. Description: State no longer having thoughts of self-harm. Target Date: 2024-01-08 Frequency: Daily Modality: individual Progress: 80%  Related Problem: Develop healthy cognitive patterns and beliefs about self and the world that lead to alleviation and help prevent the relapse of mood episodes. Description: Verbalize any history of past and present suicidal thoughts and actions. Target Date: 2024-01-08 Frequency: Daily Modality: individual Progress: 80%  Related Problem: Develop healthy cognitive patterns and beliefs about self and the world that lead to alleviation and help prevent the relapse of mood episodes. Description: Take prescribed medications as directed. Target Date:  2024-01-08 Frequency: Daily Modality: individual Progress: 90%  Related Problem: Develop healthy cognitive patterns and beliefs about self and the world that lead to alleviation and help prevent the relapse of mood episodes. Description: Discuss and resolve troubling personal and interpersonal issues. Target Date: 2024-01-08 Frequency: Daily Modality: individual Progress: 80% Resolve complicated grief       Target Date: 12-25       Progress: 30%  Client Response full compliance  Service Location Location, [address] [place], [place] [address] -virtual Service Code cpt [phone]  Emotion regulation skills  Related past to present  Rationally challenge thoughts or beliefs/cognitive restructuring  Identify/label emotions  Validate/empathize  Self care activities  Lifestyle change (exercise, nutrition)  Self-monitoring  Facilitate problem solving  Session notes:  f31.9 Bipolar Unspecified.  Goals: Mood stabilization, improved marital relationship, improve self esteem and elimination of self harm thoughts/tendencies. Also, patient struggles with leaving home comfortably and seeks to improve this ability. Target date for these goals is January, 2023. In addition, she needs to improve self care behaviors due to her weight and various medical conditions. Target date for this goal is Feb., 2022. Date revised to December, 2023 to allow time for behavioral implementation. Reports periodic outbursts of rage/anger. Seeks to minimize. Target date is  December, 2023. Patient has realized improvement in all areas. Requests additional treatment to facilitate maintenance of progress. Goal date 12-25. Patient's husband died unexpectedly and she is suffering complicated grief. She needs additional counseling to address associated issues and manage feelings of loss, despair and anxiety. Goal date 12-25.  Meds: Vyralar (6mg ),Nuedexta  (40+20mg /day), Adderall (20mg  3X/day), Xanax  (1mg , 3X/day), and  Klonopin  (1mg  3X/day).   Patient was seen face to face in provider's office.    Session note: [name]'s sleep is "worse" in that she is sleeping only a couple of hours at a time. I contacted the sleep specialist to ask if her appointment can be moved up. She says meds do not help and she has take "up to 3 Trazodone". She did schedule her pulmonary appointment, but isn't able to get a time until the end of August. She is having nightmares. Says she has recurring nightmare of being kept prisoner in an underground bunker. In the dream. She pleads to be released, but to no avail. Represents how she feels every day....trapped and powerless. She reports she is just existing. She claims she is complying with her medications. She is going to niece's wedding this weekend. Brother will drive her to [place].                                               Jola Nash, PhD  Time:3:15p-4:00p 45 minutes.

## 2023-07-05 ENCOUNTER — Telehealth: Payer: Self-pay

## 2023-07-05 NOTE — Telephone Encounter (Signed)
 Copied from CRM [id number]. Topic: General - Other >> Jul 05, 2023 11:32 AM Deaijah H wrote: Reason for CRM: Patient called in to return call to Pharmacist Redmond Candle. Could not reach out to due to being offline. Please call [phone]

## 2023-07-08 NOTE — Telephone Encounter (Signed)
 Attempted to return call, had to leave a voicemail.

## 2023-07-09 ENCOUNTER — Telehealth: Payer: Self-pay

## 2023-07-09 ENCOUNTER — Ambulatory Visit: Admitting: Psychology

## 2023-07-09 DIAGNOSIS — F319 Bipolar disorder, unspecified: Secondary | ICD-10-CM | POA: Diagnosis not present

## 2023-07-09 NOTE — Progress Notes (Signed)
   07/09/2023  Patient ID: [name], female   DOB: Oct 08, 1970, 53 y.o.   MRN: [id number]  Pharmacy Quality Measure Review  This patient is appearing on a report for being at risk of failing the adherence measure for hypertension (ACEi/ARB) medications this calendar year.   Medication: Losartan  25mg  Last fill date: 02/22/23 for 90 day supply  Left voicemail for patient to return my call at their convenience.  Carnell Christian, PharmD Clinical Pharmacist [phone]

## 2023-07-09 NOTE — Progress Notes (Signed)
 [name] is a 53 y.o. female patient   Treatment Plan: Date: 07/09/2023  Diagnosis 296.80 (Unspecified bipolar or related disorder) [n/a]  Symptoms Depressed or irritable mood. (Status: maintained) -- No Description Entered  Diminished interest in or enjoyment of activities. (Status: maintained) -- No Description Entered  Lack of energy. (Status: maintained) -- No Description Entered  Low self-esteem. (Status: maintained) -- No Description Entered  Medication Status compliance  Safety none  If Suicidal or Homicidal State Action Taken: unspecified  Current Risk: low Medications Adderall (Dosage: 20mg  4X/day)  Klonapin (Dosage: 1mg  3X/day)  Lamictal  (Dosage: 200mg  2x/day)  Nuedexta  (Dosage: 20mg )  Vyralar (Dosage: 6mg )  Xanax  (Dosage: 1mg  3X/day)  Objectives Related Problem: Develop healthy cognitive patterns and beliefs about self and the world that lead to alleviation and help prevent the relapse of mood episodes. Description: State no longer having thoughts of self-harm. Target Date: 2024-01-08 Frequency: Daily Modality: individual Progress: 80%  Related Problem: Develop healthy cognitive patterns and beliefs about self and the world that lead to alleviation and help prevent the relapse of mood episodes. Description: Verbalize any history of past and present suicidal thoughts and actions. Target Date: 2024-01-08 Frequency: Daily Modality: individual Progress: 80%  Related Problem: Develop healthy cognitive patterns and beliefs about self and the world that lead to alleviation and help prevent the relapse of mood episodes. Description: Take prescribed medications as  directed. Target Date: 2024-01-08 Frequency: Daily Modality: individual Progress: 90%  Related Problem: Develop healthy cognitive patterns and beliefs about self and the world that lead to alleviation and help prevent the relapse of mood episodes. Description: Discuss and resolve troubling personal and interpersonal issues. Target Date: 2024-01-08 Frequency: Daily Modality: individual Progress: 80% Resolve complicated grief       Target Date: 12-25       Progress: 30%  Client Response full compliance  Service Location Location, [address] [place], [place] [address] -virtual Service Code cpt [phone]  Emotion regulation skills  Related past to present  Rationally challenge thoughts or beliefs/cognitive restructuring  Identify/label emotions  Validate/empathize  Self care activities  Lifestyle change (exercise, nutrition)  Self-monitoring  Facilitate problem solving  Session notes:  f31.9 Bipolar Unspecified.  Goals: Mood stabilization, improved marital relationship, improve self esteem and elimination of self harm thoughts/tendencies. Also, patient struggles with leaving home comfortably and seeks to improve this ability. Target date for these goals is January, 2023. In addition, she needs to improve self care behaviors due to her weight and various medical conditions. Target date for this goal is Feb., 2022. Date revised to December, 2023 to allow  time for behavioral implementation. Reports periodic outbursts of rage/anger. Seeks to minimize. Target date is December, 2023. Patient has realized improvement in all areas. Requests additional treatment to facilitate maintenance of progress. Goal date 12-25. Patient's husband died unexpectedly and she is suffering complicated grief. She needs additional counseling to address associated issues and manage feelings of loss, despair and anxiety. Goal date 12-25.  Meds: Vyralar (6mg ),Nuedexta  (40+20mg /day), Adderall (20mg  3X/day), Xanax   (1mg , 3X/day), and Klonopin  (1mg  3X/day).   Patient was seen face to face in provider's office.    Session note: [name]'s sleep continues to decline. I spoke with sleep specialist who has moved up her appointment.  Will follow-up after appointment to discuss. [name] is skeptical but willing to participate. She reports wanting to overcome this sleep problem as it interferes with every part of her life. Moods continue to be flat and she struggles to find any drive/motivation to do anything. Is particularly noncompliant with her medical appointments. Emphasized the need for her to move forward with her treatments, especially her GI issues.                                               CONI ALM KERNS, PhD  Time:2:15p-3:00p 45 minutes.

## 2023-07-10 ENCOUNTER — Ambulatory Visit (INDEPENDENT_AMBULATORY_CARE_PROVIDER_SITE_OTHER): Admitting: Psychology

## 2023-07-10 DIAGNOSIS — G4733 Obstructive sleep apnea (adult) (pediatric): Secondary | ICD-10-CM | POA: Diagnosis not present

## 2023-07-10 DIAGNOSIS — F5105 Insomnia due to other mental disorder: Secondary | ICD-10-CM | POA: Diagnosis not present

## 2023-07-10 DIAGNOSIS — F319 Bipolar disorder, unspecified: Secondary | ICD-10-CM
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
## 2023-07-17 ENCOUNTER — Ambulatory Visit (INDEPENDENT_AMBULATORY_CARE_PROVIDER_SITE_OTHER): Admitting: Psychology

## 2023-07-17 DIAGNOSIS — F319 Bipolar disorder, unspecified: Secondary | ICD-10-CM | POA: Diagnosis not present

## 2023-07-17 NOTE — Progress Notes (Signed)
 [name] is a 53 y.o. female patient   Treatment Plan: Date: 07/17/2023  Diagnosis 296.80 (Unspecified bipolar or related disorder) [n/a]  Symptoms Depressed or irritable mood. (Status: maintained) -- No Description Entered  Diminished interest in or enjoyment of activities. (Status: maintained) -- No Description Entered  Lack of energy. (Status: maintained) -- No Description Entered  Low self-esteem. (Status: maintained) -- No Description Entered  Medication Status compliance  Safety none  If Suicidal or Homicidal State Action Taken: unspecified  Current Risk: low Medications Adderall (Dosage: 20mg  4X/day)  Klonapin (Dosage: 1mg  3X/day)  Lamictal  (Dosage: 200mg  2x/day)  Nuedexta  (Dosage: 20mg )  Vyralar (Dosage: 6mg )  Xanax  (Dosage: 1mg  3X/day)  Objectives Related Problem: Develop healthy cognitive patterns and beliefs about self and the world that lead to alleviation and help prevent the relapse of mood episodes. Description: State no longer having thoughts of self-harm. Target Date: 2024-01-08 Frequency: Daily Modality: individual Progress: 80%  Related Problem: Develop healthy cognitive patterns and beliefs about self and the world that lead to alleviation and help prevent the relapse of mood episodes. Description: Verbalize any history of past and present suicidal thoughts and actions. Target Date: 2024-01-08 Frequency: Daily Modality: individual Progress: 80%  Related Problem: Develop healthy cognitive patterns and beliefs about self and the world that lead to alleviation and help prevent the relapse of mood episodes. Description:  Take prescribed medications as directed. Target Date: 2024-01-08 Frequency: Daily Modality: individual Progress: 90%  Related Problem: Develop healthy cognitive patterns and beliefs about self and the world that lead to alleviation and help prevent the relapse of mood episodes. Description: Discuss and resolve troubling personal and interpersonal issues. Target Date: 2024-01-08 Frequency: Daily Modality: individual Progress: 80% Resolve complicated grief       Target Date: 12-25       Progress: 30%  Client Response full compliance  Service Location Location, [address] [place], [place] [address] -virtual Service Code cpt [phone]  Emotion regulation skills  Related past to present  Rationally challenge thoughts or beliefs/cognitive restructuring  Identify/label emotions  Validate/empathize  Self care activities  Lifestyle change (exercise, nutrition)  Self-monitoring  Facilitate problem solving  Session notes:  f31.9 Bipolar Unspecified.  Goals: Mood stabilization, improved marital relationship, improve self esteem and elimination of self harm thoughts/tendencies. Also, patient struggles with leaving home comfortably and seeks to improve this ability. Target date for these goals is January, 2023. In addition, she needs to improve self care behaviors due to her weight and various medical conditions.  Target date for this goal is Feb., 2022. Date revised to December, 2023 to allow time for behavioral implementation. Reports periodic outbursts of rage/anger. Seeks to minimize. Target date is December, 2023. Patient has realized improvement in all areas. Requests additional treatment to facilitate maintenance of progress. Goal date 12-25. Patient's husband died unexpectedly and she is suffering complicated grief. She needs additional counseling to address associated issues and manage feelings of loss, despair and anxiety. Goal date 12-25.  Meds: Vyralar (6mg ),Nuedexta   (40+20mg /day), Adderall (20mg  3X/day), Xanax  (1mg , 3X/day), and Klonopin  (1mg  3X/day).   Patient was seen face to face in provider's office.    Session note: [name] talked about her visit with her father on Father's Day. Had a good visit and enjoyed time together. Discussed her sleep and her appointment with Millicent Ally to help with sleep hygiene. Was told to keep a sleep diary and to e-mail it before next appointment. Also reports that she has not had much appetite and isn't eating well. Talked about impact of the poor sleep and poor nutrition on her mental health.                                                 Jola Nash, PhD  Time:3:15p-4:00p 45 minutes.

## 2023-07-23 ENCOUNTER — Ambulatory Visit (INDEPENDENT_AMBULATORY_CARE_PROVIDER_SITE_OTHER): Admitting: Psychology

## 2023-07-23 DIAGNOSIS — F319 Bipolar disorder, unspecified: Secondary | ICD-10-CM

## 2023-07-23 NOTE — Progress Notes (Signed)
 Madalena L Fickling is a 53 y.o. female patient   Treatment Plan: Date: 07/23/2023  Diagnosis 296.80 (Unspecified bipolar or related disorder) [n/a]  Symptoms Depressed or irritable mood. (Status: maintained) -- No Description Entered  Diminished interest in or enjoyment of activities. (Status: maintained) -- No Description Entered  Lack of energy. (Status: maintained) -- No Description Entered  Low self-esteem. (Status: maintained) -- No Description Entered  Medication Status compliance  Safety none  If Suicidal or Homicidal State Action Taken: unspecified  Current Risk: low Medications Adderall (Dosage: 20mg  4X/day)  Klonapin (Dosage: 1mg  3X/day)  Lamictal  (Dosage: 200mg  2x/day)  Nuedexta  (Dosage: 20mg )  Vyralar (Dosage: 6mg )  Xanax  (Dosage: 1mg  3X/day)  Objectives Related Problem: Develop healthy cognitive patterns and beliefs about self and the world that lead to alleviation and help prevent the relapse of mood episodes. Description: State no longer having thoughts of self-harm. Target Date: 2024-01-08 Frequency: Daily Modality: individual Progress: 80%  Related Problem: Develop healthy cognitive patterns and beliefs about self and the world that lead to alleviation and help prevent the relapse of mood episodes. Description: Verbalize any history of past and present suicidal thoughts and actions. Target Date: 2024-01-08 Frequency: Daily Modality: individual Progress: 80%  Related Problem: Develop healthy cognitive patterns and beliefs about self and the world that lead to alleviation and help prevent the relapse of mood episodes. Description:  Take prescribed medications as directed. Target Date: 2024-01-08 Frequency: Daily Modality: individual Progress: 90%  Related Problem: Develop healthy cognitive patterns and beliefs about self and the world that lead to alleviation and help prevent the relapse of mood episodes. Description: Discuss and resolve troubling personal and interpersonal issues. Target Date: 2024-01-08 Frequency: Daily Modality: individual Progress: 80% Resolve complicated grief       Target Date: 12-25       Progress: 30%  Client Response full compliance  Service Location Location, [address] [place], [place] [address] -virtual Service Code cpt [phone]  Emotion regulation skills  Related past to present  Rationally challenge thoughts or beliefs/cognitive restructuring  Identify/label emotions  Validate/empathize  Self care activities  Lifestyle change (exercise, nutrition)  Self-monitoring  Facilitate problem solving  Session notes:  f31.9 Bipolar Unspecified.  Goals: Mood stabilization, improved marital relationship, improve self esteem and elimination of self harm thoughts/tendencies. Also, patient struggles with leaving home comfortably and seeks to improve this ability. Target date for these goals is January, 2023. In addition, she needs to improve self care behaviors due to her weight and various medical conditions.  Target date for this goal is Feb., 2022. Date revised to December, 2023 to allow time for behavioral implementation. Reports periodic outbursts of rage/anger. Seeks to minimize. Target date is December, 2023. Patient has realized improvement in all areas. Requests additional treatment to facilitate maintenance of progress. Goal date 12-25. Patient's husband died unexpectedly and she is suffering complicated grief. She needs additional counseling to address associated issues and manage feelings of loss, despair and anxiety. Goal date 12-25.  Meds: Vyralar (6mg ),Nuedexta   (40+20mg /day), Adderall (20mg  3X/day), Xanax  (1mg , 3X/day), and Klonopin  (1mg  3X/day).   Patient agreed to be seen in video (Caregility) session. She is aware of platform limitations. Areen is at home and provider in office.    Session note: Danesha states her sleep is worse than ever. She has another appointment with Veva Alma tomorrow. She also says she is not eating because of lack of appetite and nothing tastes good. She is, however, hydrating well. Says that a friend is coming to visit and spend the night with her on Friday night. Amenda is looking forward to it. Talked about her need for nutrition and importance of getting back to her medical doctor. Stopped taking cholesterol meds because she has to e seen by doctor to renew prescription.                                                     CONI ALM KERNS, PhD  Time:2:15p-3:00p 45 minutes.                                                                     CONI ALM KERNS, PhD

## 2023-07-24 ENCOUNTER — Ambulatory Visit (INDEPENDENT_AMBULATORY_CARE_PROVIDER_SITE_OTHER): Admitting: Psychology

## 2023-07-24 DIAGNOSIS — F5105 Insomnia due to other mental disorder: Secondary | ICD-10-CM

## 2023-07-24 DIAGNOSIS — F319 Bipolar disorder, unspecified: Secondary | ICD-10-CM | POA: Diagnosis not present

## 2023-07-24 NOTE — Progress Notes (Signed)
 [facility] Counselor/Therapist Progress Note  Patient ID: [name], MRN: [id number],    Date: 07/24/2023  Time Spent: 2:00pm-2:45pm   45 minutes   Treatment Type: Individual Therapy  Reported Symptoms: depression  Mental Status Exam: Appearance:  Casual     Behavior: Appropriate  Motor: Normal  Speech/Language:  Normal Rate  Affect: Appropriate  Mood: depressed  Thought process: normal  Thought content:   WNL  Sensory/Perceptual disturbances:   WNL  Orientation: oriented to person, place, time/date, and situation  Attention: Fair  Concentration: Fair  Memory: WNL  Fund of knowledge:  Fair  Insight:   Fair  Judgment:  Fair  Impulse Control: Fair   Risk Assessment: Danger to Self:  No Self-injurious Behavior: No Danger to Others: No Duty to Warn:no Physical Aggression / Violence:No  Access to Firearms a concern: No  Gang Involvement:No   Subjective: Pt present for face-to-face individual therapy via video.  Pt consents to telehealth video session and is aware of limitations and benefits of virtual sessions.   Location of pt: home Location of therapist: home office.   Pt states her sleep has been different the past few days.   A few days ago she was up for 3 days and nights and states she could not sleep at all other than a couple of cat naps.   Pt then was so tired that she slept from 3:00pm on 6/24 until 12:00pm today 6/25.   Pt states she slept uninterrupted and has not gotten that much sleep in a long time.   Talked with pt about capitalizing on this opportunity to set her sleep schedule to sleeping at night instead of days, which is her goal.   Assessed pt's circadian rhythm and she identified that she has always been a night owl.  She typically has not gone to sleep before 1:30am.   Pt states her ideal sleep time would be 1:30am to 10:00am.    Educated pt about how to achieve her goal.  Emphasized the importance of not napping during the  day bc napping contributes to disrupted night time sleep.   Pt's situation is complicated by the fact that she stays in bed most days and nights bc her most comfortable position is lying down due to pain issues.  Worked with pt on how she can keep herself awake during the day and identified that she needs to engage in activities such as talking on the phone to her friends and father, playing games on her phone, watching tv, and reading.   Worked with pt on how she can adhere to completing daily sleep diaries.   She states she will make an effort to complete the diaries and email them to therapist the day before our next session.  Plan to meet in two weeks.    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  Pt participated in setting treatment goals.   Pt wants to improve her sleep and get her nights and days back on track.  Plan to meet with pt every two weeks for CBT-I.   Pt is in agreement with treatment plan.    Treatment Plan Client Abilities/Strengths  Pt is motivated for therapy.  Client Treatment Preferences  individual therapy  Client Statement of Needs  Improve sleep and coping skills.  Symptoms  Complains of difficulty falling asleep. Complains of difficulty remaining asleep. Problems Addressed  Sleep Disturbance Goals 1. Feel refreshed  and energetic during wakeful hours. 2. Restore restful sleep pattern. Objective Learn and implement stimulus control strategies to establish a consistent sleep-wake rhythm. Target Date: 2024-07-09  Frequency: Biweekly Progress: 10 Modality: individual Related Interventions 1. Discuss with the client the rationale for stimulus control strategies to establish a consistent sleep-wake cycle (see Behavioral Treatments for Sleep Disorders by Perlis, Aloia, and Juanice). 2. Teach the client stimulus control techniques (e.g., lie down to sleep only when sleepy; do not use the bed for  activities like watching television, reading, listening to music, but only for sleep or sexual activity; get out of bed if sleep doesn't arrive soon after retiring; lie back down when sleepy; set alarm to the same wake-up time every morning regardless of sleep time or quality; do not nap during the day); assign consistent implementation. 3. Instruct the client to move activities associated with arousal and activation from the bedtime ritual to other times during the day (e.g., reading stimulating content, reviewing day's events, planning for next day, watching disturbing television). 4. Monitor the client's sleep patterns and compliance with stimulus control instructions; problem-solve obstacles and reinforce successful, consistent implementation. Objective Learn and implement a sleep restriction method to increase sleep efficiency. Target Date: 2024-07-09  Frequency: Biweekly Progress: 10 Modality: individual Related Interventions 5. Use a sleep restriction therapy approach in which the amount of time in bed is reduced to match the amount of time the patient typically sleeps (e.g., from 8 hours to 5), thus inducing systematic sleep deprivation; periodically adjust sleep time upward until an optimal sleep duration is reached. Objective Learn and implement calming skills for use at bedtime. Target Date: 2024-07-09  Frequency: Biweekly Progress: 10 Modality: individual Related Interventions 6. Teach the client relaxation skills (e.g., progressive muscle relaxation, guided imagery, slow diaphragmatic breathing); teach the client how to apply these skills to facilitate relaxation and sleep at bedtime (see Bedtime Relaxation Techniques by Otto armin Carbon). 7. Refer the client for or conduct biofeedback training to strengthen the client's successful relaxation response. Objective Practice good sleep hygiene. Target Date: 2024-07-09  Frequency: Biweekly Progress: 10 Modality: individual Related  Interventions 8. Instruct the client in sleep hygiene practices such as restricting excessive liquid intake, spicy late night snacks, or heavy evening meals; exercising regularly, but not within 3 - 4 hours of bedtime; minimizing or avoiding caffeine, alcohol, tobacco, and stimulant intake (or assign Sleep Pattern Record in the Adult Psychotherapy Homework Planner by [facility]). Diagnosis Insomnia F51.05 Conditions For Discharge Achievement of treatment goals and objectives   Treatment Plan Client Abilities/Strengths  Pt is motivated for therapy.  Client Treatment Preferences  Individual therapy.  Client Statement of Needs  Improve coping skills.  Symptoms  Depressed or irritable mood. Diminished interest in or enjoyment of activities. Lack of energy. Sleeplessness or hypersomnia. Social withdrawal. Problems Addressed  Bipolar Disorder - Depression  Goals 1. Normalize sleep patterns and address how mood impacts sleep disturbance.  2. Develop healthy thinking patterns and beliefs about self, others, and the world that lead to the alleviation and help prevent the relapse of depression. 3. Normalize energy level and return to usual activities, good judgment, stable mood, more realistic expectations, and goal-directed behavior. . Objective Maintain a pattern of regular rhythm to daily activities. Target Date: 2024-07-09 Frequency: Biweekly  Progress: 10 Modality: individual  Related Interventions Engage the client in a balanced schedule of behavioral activation by scheduling rewarding activities while not over-stimulating; use activity and mood monitoring to facilitate an optimal balance of activity; reinforce success.  Teach the client about the importance of good sleep hygiene. assess and intervene accordingly. Assist the client in establishing a more routine pattern of daily activities such as sleeping, eating, solitary and social activities, and exercise; use and review a form to  schedule, assess, and modify these activities so that they occur in a predictable rhythm every day. Conduct Interpersonal and Social Rhythm Therapy beginning with the assessment of the client's daily activities using an interview and the Social Rhythm Metric. Objective Identify and replace thoughts and behaviors that trigger depressive symptoms. Target Date: 2024-07-09 Frequency: Biweekly  Progress: 10 Modality: individual  Related Interventions Teach the client cognitive-behavioral coping and relapse prevention skills including delaying impulsive actions, structured scheduling of daily activities, keeping a regular sleep routine, avoiding unrealistic goal striving, using relaxation procedures, identifying and avoiding episode triggers such as stimulant drug use, alcohol consumption, breaking sleep routine, or exposing self to high stress. Assign the client a homework exercise in which he/she identifies self-talk reflective of mania, biases in the self-talk, alternatives. review and reinforce success, providing corrective feedback toward improvement. Use cognitive therapy techniques to explore and educate the client about cognitive biases that trigger his/her elevated or depressive mood. Diagnosis F31.9 Conditions For Discharge Achievement of treatment goals and objectives   Veva Alma, LCSW

## 2023-07-30 ENCOUNTER — Ambulatory Visit (INDEPENDENT_AMBULATORY_CARE_PROVIDER_SITE_OTHER): Admitting: Psychology

## 2023-07-30 DIAGNOSIS — F319 Bipolar disorder, unspecified: Secondary | ICD-10-CM | POA: Diagnosis not present

## 2023-07-30 NOTE — Progress Notes (Signed)
 [name] is a 53 y.o. female patient   Treatment Plan: Date: 07/30/2023  Diagnosis 296.80 (Unspecified bipolar or related disorder) [n/a]  Symptoms Depressed or irritable mood. (Status: maintained) -- No Description Entered  Diminished interest in or enjoyment of activities. (Status: maintained) -- No Description Entered  Lack of energy. (Status: maintained) -- No Description Entered  Low self-esteem. (Status: maintained) -- No Description Entered  Medication Status compliance  Safety none  If Suicidal or Homicidal State Action Taken: unspecified  Current Risk: low Medications Adderall (Dosage: 20mg  4X/day)  Klonapin (Dosage: 1mg  3X/day)  Lamictal  (Dosage: 200mg  2x/day)  Nuedexta  (Dosage: 20mg )  Vyralar (Dosage: 6mg )  Xanax  (Dosage: 1mg  3X/day)  Objectives Related Problem: Develop healthy cognitive patterns and beliefs about self and the world that lead to alleviation and help prevent the relapse of mood episodes. Description: State no longer having thoughts of self-harm. Target Date: 2024-01-08 Frequency: Daily Modality: individual Progress: 80%  Related Problem: Develop healthy cognitive patterns and beliefs about self and the world that lead to alleviation and help prevent the relapse of mood episodes. Description: Verbalize any history of past and present suicidal thoughts and actions. Target Date: 2024-01-08 Frequency: Daily Modality: individual Progress: 80%  Related Problem: Develop healthy cognitive patterns and beliefs about self and the world that lead to alleviation and help prevent the relapse of  mood episodes. Description: Take prescribed medications as directed. Target Date: 2024-01-08 Frequency: Daily Modality: individual Progress: 90%  Related Problem: Develop healthy cognitive patterns and beliefs about self and the world that lead to alleviation and help prevent the relapse of mood episodes. Description: Discuss and resolve troubling personal and interpersonal issues. Target Date: 2024-01-08 Frequency: Daily Modality: individual Progress: 80% Resolve complicated grief       Target Date: 12-25       Progress: 30%  Client Response full compliance  Service Location Location, [address] [place], [place] [address] -virtual Service Code cpt [phone]  Emotion regulation skills  Related past to present  Rationally challenge thoughts or beliefs/cognitive restructuring  Identify/label emotions  Validate/empathize  Self care activities  Lifestyle change (exercise, nutrition)  Self-monitoring  Facilitate problem solving  Session notes:  f31.9 Bipolar Unspecified.  Goals: Mood stabilization, improved marital relationship, improve self esteem and elimination of self harm thoughts/tendencies. Also, patient struggles with leaving home comfortably and seeks to improve this ability. Target date for these goals is January, 2023. In addition,  she needs to improve self care behaviors due to her weight and various medical conditions. Target date for this goal is Feb., 2022. Date revised to December, 2023 to allow time for behavioral implementation. Reports periodic outbursts of rage/anger. Seeks to minimize. Target date is December, 2023. Patient has realized improvement in all areas. Requests additional treatment to facilitate maintenance of progress. Goal date 12-25. Patient's husband died unexpectedly and she is suffering complicated grief. She needs additional counseling to address associated issues and manage feelings of loss, despair and anxiety. Goal date 12-25.  Meds: Vyralar  (6mg ),Nuedexta  (40+20mg /day), Adderall (20mg  3X/day), Xanax  (1mg , 3X/day), and Klonopin  (1mg  3X/day).   Patient agreed to be seen in video (Caregility) session. She is aware of platform limitations. [name] is at home and provider in office.    Session note: [name] reports that she had normal sleep on Friday Saturday and Sunday. She took Dayvigo medication (from a friend) on those nights. Says she will not talk to her doctor about getting a prescription for herself until she feels more confidence in her ability to not abuse them. Reports that she had a friend visit from [place] and hasn't seen her for many years, except for [employer]. Says they got along great and picked right back up. She had another meeting with Veva Alma and says she is using the materials that were provided to help record her sleep. Is encouraged and will continue. Talked about the possibility that her very close friends, [name] and [name], [place]. This would be very difficult for her                                                      CONI ALM KERNS, PhD  Time:2:15p-3:00p 45 minutes.

## 2023-07-31 ENCOUNTER — Ambulatory Visit: Admitting: Psychology

## 2023-08-06 ENCOUNTER — Ambulatory Visit: Admitting: Psychology

## 2023-08-06 DIAGNOSIS — F319 Bipolar disorder, unspecified: Secondary | ICD-10-CM

## 2023-08-06 NOTE — Progress Notes (Signed)
 [name] is a 53 y.o. female patient   Treatment Plan: Date: 08/06/2023  Diagnosis 296.80 (Unspecified bipolar or related disorder) [n/a]  Symptoms Depressed or irritable mood. (Status: maintained) -- No Description Entered  Diminished interest in or enjoyment of activities. (Status: maintained) -- No Description Entered  Lack of energy. (Status: maintained) -- No Description Entered  Low self-esteem. (Status: maintained) -- No Description Entered  Medication Status compliance  Safety none  If Suicidal or Homicidal State Action Taken: unspecified  Current Risk: low Medications Adderall (Dosage: 20mg  4X/day)  Klonapin (Dosage: 1mg  3X/day)  Lamictal  (Dosage: 200mg  2x/day)  Nuedexta  (Dosage: 20mg )  Vyralar (Dosage: 6mg )  Xanax  (Dosage: 1mg  3X/day)  Objectives Related Problem: Develop healthy cognitive patterns and beliefs about self and the world that lead to alleviation and help prevent the relapse of mood episodes. Description: State no longer having thoughts of self-harm. Target Date: 2024-01-08 Frequency: Daily Modality: individual Progress: 80%  Related Problem: Develop healthy cognitive patterns and beliefs about self and the world that lead to alleviation and help prevent the relapse of mood episodes. Description: Verbalize any history of past and present suicidal thoughts and actions. Target Date: 2024-01-08 Frequency: Daily Modality: individual Progress: 80%  Related Problem: Develop healthy cognitive patterns and beliefs about self and the world that lead to alleviation and help prevent the relapse of mood episodes. Description: Take prescribed medications as directed. Target Date: 2024-01-08 Frequency: Daily Modality: individual Progress: 90%  Related Problem: Develop healthy cognitive patterns and beliefs about self and the world that lead to alleviation and help prevent the relapse of mood episodes. Description: Discuss and resolve troubling  personal and interpersonal issues. Target Date: 2024-01-08 Frequency: Daily Modality: individual Progress: 80% Resolve complicated grief       Target Date: 12-25       Progress: 30%  Client Response full compliance  Service Location Location, [address] [place], [place] [address] -virtual Service Code cpt [phone]  Emotion regulation skills  Related past to present  Rationally challenge thoughts or beliefs/cognitive restructuring  Identify/label emotions  Validate/empathize  Self care activities  Lifestyle change (exercise, nutrition)  Self-monitoring  Facilitate problem solving  Session notes:  f31.9 Bipolar Unspecified.  Goals: Mood stabilization, improved marital relationship, improve self esteem and elimination of self harm thoughts/tendencies. Also, patient struggles with leaving home comfortably and seeks to improve this ability. Target date for these goals is January, 2023. In addition, she needs to improve self care behaviors due to her weight and various medical conditions. Target date for this goal is Feb., 2022. Date revised to December, 2023 to allow time for behavioral implementation. Reports periodic outbursts of rage/anger. Seeks to minimize. Target date is December, 2023. Patient has realized improvement in all areas. Requests additional treatment to facilitate maintenance of progress. Goal date 12-25. Patient's husband died unexpectedly and she is suffering complicated grief. She needs additional counseling to address associated issues and manage feelings of loss, despair and anxiety. Goal date 12-25.  Meds: Vyralar (6mg ),Nuedexta  (40+20mg /day), Adderall (20mg  3X/day), Xanax  (1mg , 3X/day), and Klonopin  (1mg  3X/day).   Patient agreed to be seen in video (Caregility) session. She is aware of platform limitations. [name] is at home and provider in office.    Session note: [name] states no change since last meeting. She went to her friend's house for a July 4th  cookout. Says she is having more frequent episodes of not feeling well. Mostly GI related. Says she mostly eats junk food because it is  easy. She is also restricting intake because she doesn't feel  like eating. Her friends Cy and [name] are wanting to [place] the beach, which is another loss for [facility].  She continues to ignore her own physical health by failing to seek medical attention for a chronic cough and GI distress. Will see Dr. Vincente and Veva Alma tomorrow.                                                        CONI ALM KERNS, PhD  Time:2:15p-3:00p 45 minutes.

## 2023-08-07 ENCOUNTER — Ambulatory Visit: Admitting: Psychology

## 2023-08-07 NOTE — Progress Notes (Deleted)
 [facility] Counselor/Therapist Progress Note  Patient ID: [name], MRN: [id number],    Date: 08/07/2023  Time Spent: 2:00pm-2:45pm   45 minutes   Treatment Type: Individual Therapy  Reported Symptoms: depression  Mental Status Exam: Appearance:  Casual     Behavior: Appropriate  Motor: Normal  Speech/Language:  Normal Rate  Affect: Appropriate  Mood: depressed  Thought process: normal  Thought content:   WNL  Sensory/Perceptual disturbances:   WNL  Orientation: oriented to person, place, time/date, and situation  Attention: Fair  Concentration: Fair  Memory: WNL  Fund of knowledge:  Fair  Insight:   Fair  Judgment:  Fair  Impulse Control: Fair   Risk Assessment: Danger to Self:  No Self-injurious Behavior: No Danger to Others: No Duty to Warn:no Physical Aggression / Violence:No  Access to Firearms a concern: No  Gang Involvement:No   Subjective: Pt present for face-to-face individual therapy via video.  Pt consents to telehealth video session and is aware of limitations and benefits of virtual sessions.   Location of pt: home Location of therapist: home office.   Pt states her sleep has been different the past few days.   A few days ago she was up for 3 days and nights and states she could not sleep at all other than a couple of cat naps.   Pt then was so tired that she slept from 3:00pm on 6/24 until 12:00pm today 6/25.   Pt states she slept uninterrupted and has not gotten that much sleep in a long time.   Talked with pt about capitalizing on this opportunity to set her sleep schedule to sleeping at night instead of days, which is her goal.   Assessed pt's circadian rhythm and she identified that she has always been a night owl.  She typically has not gone to sleep before 1:30am.   Pt states her ideal sleep time would be 1:30am to 10:00am.    Educated pt about how to achieve her goal.  Emphasized the importance of not napping during the day  bc napping contributes to disrupted night time sleep.   Pt's situation is complicated by the fact that she stays in bed most days and nights bc her most comfortable position is lying down due to pain issues.  Worked with pt on how she can keep herself awake during the day and identified that she needs to engage in activities such as talking on the phone to her friends and father, playing games on her phone, watching tv, and reading.   Worked with pt on how she can adhere to completing daily sleep diaries.   She states she will make an effort to complete the diaries and email them to therapist the day before our next session.  Plan to meet in two weeks.    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  Pt participated in setting treatment goals.   Pt wants to improve her sleep and get her nights and days back on track.  Plan to meet with pt every two weeks for CBT-I.   Pt is in agreement with treatment plan.    Treatment Plan Client Abilities/Strengths  Pt is motivated for therapy.  Client Treatment Preferences  individual therapy  Client Statement of Needs  Improve sleep and coping skills.  Symptoms  Complains of difficulty falling asleep. Complains of difficulty remaining asleep. Problems Addressed  Sleep Disturbance Goals 1. Feel refreshed  and energetic during wakeful hours. 2. Restore restful sleep pattern. Objective Learn and implement stimulus control strategies to establish a consistent sleep-wake rhythm. Target Date: 2024-07-09  Frequency: Biweekly Progress: 10 Modality: individual Related Interventions 1. Discuss with the client the rationale for stimulus control strategies to establish a consistent sleep-wake cycle (see Behavioral Treatments for Sleep Disorders by Perlis, Aloia, and Juanice). 2. Teach the client stimulus control techniques (e.g., lie down to sleep only when sleepy; do not use the bed for  activities like watching television, reading, listening to music, but only for sleep or sexual activity; get out of bed if sleep doesn't arrive soon after retiring; lie back down when sleepy; set alarm to the same wake-up time every morning regardless of sleep time or quality; do not nap during the day); assign consistent implementation. 3. Instruct the client to move activities associated with arousal and activation from the bedtime ritual to other times during the day (e.g., reading stimulating content, reviewing day's events, planning for next day, watching disturbing television). 4. Monitor the client's sleep patterns and compliance with stimulus control instructions; problem-solve obstacles and reinforce successful, consistent implementation. Objective Learn and implement a sleep restriction method to increase sleep efficiency. Target Date: 2024-07-09  Frequency: Biweekly Progress: 10 Modality: individual Related Interventions 5. Use a sleep restriction therapy approach in which the amount of time in bed is reduced to match the amount of time the patient typically sleeps (e.g., from 8 hours to 5), thus inducing systematic sleep deprivation; periodically adjust sleep time upward until an optimal sleep duration is reached. Objective Learn and implement calming skills for use at bedtime. Target Date: 2024-07-09  Frequency: Biweekly Progress: 10 Modality: individual Related Interventions 6. Teach the client relaxation skills (e.g., progressive muscle relaxation, guided imagery, slow diaphragmatic breathing); teach the client how to apply these skills to facilitate relaxation and sleep at bedtime (see Bedtime Relaxation Techniques by Otto armin Carbon). 7. Refer the client for or conduct biofeedback training to strengthen the client's successful relaxation response. Objective Practice good sleep hygiene. Target Date: 2024-07-09  Frequency: Biweekly Progress: 10 Modality: individual Related  Interventions 8. Instruct the client in sleep hygiene practices such as restricting excessive liquid intake, spicy late night snacks, or heavy evening meals; exercising regularly, but not within 3 - 4 hours of bedtime; minimizing or avoiding caffeine, alcohol, tobacco, and stimulant intake (or assign Sleep Pattern Record in the Adult Psychotherapy Homework Planner by [facility]). Diagnosis Insomnia F51.05 Conditions For Discharge Achievement of treatment goals and objectives   Treatment Plan Client Abilities/Strengths  Pt is motivated for therapy.  Client Treatment Preferences  Individual therapy.  Client Statement of Needs  Improve coping skills.  Symptoms  Depressed or irritable mood. Diminished interest in or enjoyment of activities. Lack of energy. Sleeplessness or hypersomnia. Social withdrawal. Problems Addressed  Bipolar Disorder - Depression  Goals 1. Normalize sleep patterns and address how mood impacts sleep disturbance.  2. Develop healthy thinking patterns and beliefs about self, others, and the world that lead to the alleviation and help prevent the relapse of depression. 3. Normalize energy level and return to usual activities, good judgment, stable mood, more realistic expectations, and goal-directed behavior. . Objective Maintain a pattern of regular rhythm to daily activities. Target Date: 2024-07-09 Frequency: Biweekly  Progress: 10 Modality: individual  Related Interventions Engage the client in a balanced schedule of behavioral activation by scheduling rewarding activities while not over-stimulating; use activity and mood monitoring to facilitate an optimal balance of activity; reinforce success.  Teach the client about the importance of good sleep hygiene. assess and intervene accordingly. Assist the client in establishing a more routine pattern of daily activities such as sleeping, eating, solitary and social activities, and exercise; use and review a form to  schedule, assess, and modify these activities so that they occur in a predictable rhythm every day. Conduct Interpersonal and Social Rhythm Therapy beginning with the assessment of the client's daily activities using an interview and the Social Rhythm Metric. Objective Identify and replace thoughts and behaviors that trigger depressive symptoms. Target Date: 2024-07-09 Frequency: Biweekly  Progress: 10 Modality: individual  Related Interventions Teach the client cognitive-behavioral coping and relapse prevention skills including delaying impulsive actions, structured scheduling of daily activities, keeping a regular sleep routine, avoiding unrealistic goal striving, using relaxation procedures, identifying and avoiding episode triggers such as stimulant drug use, alcohol consumption, breaking sleep routine, or exposing self to high stress. Assign the client a homework exercise in which he/she identifies self-talk reflective of mania, biases in the self-talk, alternatives. review and reinforce success, providing corrective feedback toward improvement. Use cognitive therapy techniques to explore and educate the client about cognitive biases that trigger his/her elevated or depressive mood. Diagnosis F31.9 Conditions For Discharge Achievement of treatment goals and objectives   Veva Alma, LCSW

## 2023-08-14 ENCOUNTER — Ambulatory Visit (INDEPENDENT_AMBULATORY_CARE_PROVIDER_SITE_OTHER): Admitting: Psychology

## 2023-08-14 DIAGNOSIS — F319 Bipolar disorder, unspecified: Secondary | ICD-10-CM

## 2023-08-14 NOTE — Progress Notes (Signed)
 [name] is a 53 y.o. female patient   Treatment Plan: Date: 08/14/2023  Diagnosis 296.80 (Unspecified bipolar or related disorder) [n/a]  Symptoms Depressed or irritable mood. (Status: maintained) -- No Description Entered  Diminished interest in or enjoyment of activities. (Status: maintained) -- No Description Entered  Lack of energy. (Status: maintained) -- No Description Entered  Low self-esteem. (Status: maintained) -- No Description Entered  Medication Status compliance  Safety none  If Suicidal or Homicidal State Action Taken: unspecified  Current Risk: low Medications Adderall (Dosage: 20mg  4X/day)  Klonapin (Dosage: 1mg  3X/day)  Lamictal  (Dosage: 200mg  2x/day)  Nuedexta  (Dosage: 20mg )  Vyralar (Dosage: 6mg )  Xanax  (Dosage: 1mg  3X/day)  Objectives Related Problem: Develop healthy cognitive patterns and beliefs about self and the world that lead to alleviation and help prevent the relapse of mood episodes. Description: State no longer having thoughts of self-harm. Target Date: 2024-01-08 Frequency: Daily Modality: individual Progress: 80%  Related Problem: Develop healthy cognitive patterns and beliefs about self and the world that lead to alleviation and help prevent the relapse of mood episodes. Description: Verbalize any history of past and present suicidal thoughts and actions. Target Date: 2024-01-08 Frequency: Daily Modality: individual Progress: 80%  Related Problem: Develop healthy cognitive patterns and beliefs about self and the world that lead to alleviation  and help prevent the relapse of mood episodes. Description: Take prescribed medications as directed. Target Date: 2024-01-08 Frequency: Daily Modality: individual Progress: 90%  Related Problem: Develop healthy cognitive patterns and beliefs about self and the world that lead to alleviation and help prevent the relapse of mood episodes. Description: Discuss and resolve troubling personal and interpersonal issues. Target Date: 2024-01-08 Frequency: Daily Modality: individual Progress: 80% Resolve complicated grief       Target Date: 12-25       Progress: 30%  Client Response full compliance  Service Location Location, [address] [place], [place] [address] -virtual Service Code cpt [phone]  Emotion regulation skills  Related past to present  Rationally challenge thoughts or beliefs/cognitive restructuring  Identify/label emotions  Validate/empathize  Self care activities  Lifestyle change (exercise, nutrition)  Self-monitoring  Facilitate problem solving  Session notes:  f31.9 Bipolar Unspecified.  Goals: Mood stabilization, improved marital relationship, improve self esteem and elimination of self harm thoughts/tendencies. Also, patient struggles with leaving home comfortably and  seeks to improve this ability. Target date for these goals is January, 2023. In addition, she needs to improve self care behaviors due to her weight and various medical conditions. Target date for this goal is Feb., 2022. Date revised to December, 2023 to allow time for behavioral implementation. Reports periodic outbursts of rage/anger. Seeks to minimize. Target date is December, 2023. Patient has realized improvement in all areas. Requests additional treatment to facilitate maintenance of progress. Goal date 12-25. Patient's husband died unexpectedly and she is suffering complicated grief. She needs additional counseling to address associated issues and manage feelings of loss, despair and anxiety.  Goal date 12-25.  Meds: Vyralar (6mg ),Nuedexta  (40+20mg /day), Adderall (20mg  3X/day), Xanax  (1mg , 3X/day), and Klonopin  (1mg  3X/day).   Patient agreed to be seen in provider's office.  Session note: [name] reports she saw Dr.Kaur and was prescribed Sonata, but her insurance will not pay until pre-authorized. She told Dr. Jonothan office and was told they will handle it. In spite of my communication to Dr. Vincente about seeing [name] more frequently (and her acknowledgement back to me), she is not scheduled again for 6 months. She is still have extensive periods with no sleep. Only difference now is she will have 1 good night's sleep followed by some sleepless nights. Her sleep diary indicates that she has had 12 hours of sleep a night for 8 out of 11 nights. This is her self report to [employer]. Discussed the need for her to attend to her medical needs. She does have an appointment with pulmonary next month.                                                         CONI ALM KERNS, PhD  Time:3:15p-4:00p 45 minutes.

## 2023-08-21 ENCOUNTER — Ambulatory Visit: Admitting: Psychology

## 2023-08-21 ENCOUNTER — Ambulatory Visit (INDEPENDENT_AMBULATORY_CARE_PROVIDER_SITE_OTHER): Admitting: Psychology

## 2023-08-21 DIAGNOSIS — F319 Bipolar disorder, unspecified: Secondary | ICD-10-CM | POA: Diagnosis not present

## 2023-08-21 NOTE — Progress Notes (Signed)
 [name] is a 53 y.o. female patient   Treatment Plan: Date: 08/21/2023  Diagnosis 296.80 (Unspecified bipolar or related disorder) [n/a]  Symptoms Depressed or irritable mood. (Status: maintained) -- No Description Entered  Diminished interest in or enjoyment of activities. (Status: maintained) -- No Description Entered  Lack of energy. (Status: maintained) -- No Description Entered  Low self-esteem. (Status: maintained) -- No Description Entered  Medication Status compliance  Safety none  If Suicidal or Homicidal State Action Taken: unspecified  Current Risk: low Medications Adderall (Dosage: 20mg  4X/day)  Klonapin (Dosage: 1mg  3X/day)  Lamictal  (Dosage: 200mg  2x/day)  Nuedexta  (Dosage: 20mg )  Vyralar (Dosage: 6mg )  Xanax  (Dosage: 1mg  3X/day)  Objectives Related Problem: Develop healthy cognitive patterns and beliefs about self and the world that lead to alleviation and help prevent the relapse of mood episodes. Description: State no longer having thoughts of self-harm. Target Date: 2024-01-08 Frequency: Daily Modality: individual Progress: 80%  Related Problem: Develop healthy cognitive patterns and beliefs about self and the world that lead to alleviation and help prevent the relapse of mood episodes. Description: Verbalize any history of past and present suicidal thoughts and actions. Target Date: 2024-01-08 Frequency: Daily Modality: individual Progress: 80%  Related Problem: Develop healthy cognitive patterns and beliefs about self and the  world that lead to alleviation and help prevent the relapse of mood episodes. Description: Take prescribed medications as directed. Target Date: 2024-01-08 Frequency: Daily Modality: individual Progress: 90%  Related Problem: Develop healthy cognitive patterns and beliefs about self and the world that lead to alleviation and help prevent the relapse of mood episodes. Description: Discuss and resolve troubling personal and interpersonal issues. Target Date: 2024-01-08 Frequency: Daily Modality: individual Progress: 80% Resolve complicated grief       Target Date: 12-25       Progress: 30%  Client Response full compliance  Service Location Location, [address] [place], [place] [address] -virtual Service Code cpt [phone]  Emotion regulation skills  Related past to present  Rationally challenge thoughts or beliefs/cognitive restructuring  Identify/label emotions  Validate/empathize  Self care activities  Lifestyle change (exercise, nutrition)  Self-monitoring  Facilitate problem solving  Session notes:  f31.9 Bipolar Unspecified.  Goals: Mood stabilization, improved marital relationship, improve self  esteem and elimination of self harm thoughts/tendencies. Also, patient struggles with leaving home comfortably and seeks to improve this ability. Target date for these goals is January, 2023. In addition, she needs to improve self care behaviors due to her weight and various medical conditions. Target date for this goal is Feb., 2022. Date revised to December, 2023 to allow time for behavioral implementation. Reports periodic outbursts of rage/anger. Seeks to minimize. Target date is December, 2023. Patient has realized improvement in all areas. Requests additional treatment to facilitate maintenance of progress. Goal date 12-25. Patient's husband died unexpectedly and she is suffering complicated grief. She needs additional counseling to address associated issues and manage feelings  of loss, despair and anxiety. Goal date 12-25.  Meds: Vyralar (6mg ),Nuedexta  (40+20mg /day), Adderall (20mg  3X/day), Xanax  (1mg , 3X/day), and Klonopin  (1mg  3X/day).   Patient agreed to be seen in provider's office.  Session note: [name] says she had a busy weekend and saw brother, [name] and [name] and [name]. She is getting out a little more than before. She still, however, says she cannot go shopping for herself because of several factors including knee, Achilles, balance and pulmonary problems. She was able to get her sleeping medication approved from Dr. Vincente. Has not yet picked it up. We talked about being careful with regard to dosage. Agree to gives meds to Gunner and Alex to hold and she will only retain a few day's supply. She states that she is going to take [name]'s pension in one lump sum to get her through the next 4 plus years.                                                               CONI ALM KERNS, PhD  Time:2:15p-3:00p 45 minutes.

## 2023-08-28 ENCOUNTER — Ambulatory Visit (INDEPENDENT_AMBULATORY_CARE_PROVIDER_SITE_OTHER): Admitting: Psychology

## 2023-08-28 DIAGNOSIS — F319 Bipolar disorder, unspecified: Secondary | ICD-10-CM

## 2023-08-28 NOTE — Progress Notes (Signed)
 [name] is a 53 y.o. female patient   Treatment Plan: Date: 08/28/2023  Diagnosis 296.80 (Unspecified bipolar or related disorder) [n/a]  Symptoms Depressed or irritable mood. (Status: maintained) -- No Description Entered  Diminished interest in or enjoyment of activities. (Status: maintained) -- No Description Entered  Lack of energy. (Status: maintained) -- No Description Entered  Low self-esteem. (Status: maintained) -- No Description Entered  Medication Status compliance  Safety none  If Suicidal or Homicidal State Action Taken: unspecified  Current Risk: low Medications Adderall (Dosage: 20mg  4X/day)  Klonapin (Dosage: 1mg  3X/day)  Lamictal  (Dosage: 200mg  2x/day)  Nuedexta  (Dosage: 20mg )  Vyralar (Dosage: 6mg )  Xanax  (Dosage: 1mg  3X/day)  Objectives Related Problem: Develop healthy cognitive patterns and beliefs about self and the world that lead to alleviation and help prevent the relapse of mood episodes. Description: State no longer having thoughts of self-harm. Target Date: 2024-01-08 Frequency: Daily Modality: individual Progress: 80%  Related Problem: Develop healthy cognitive patterns and beliefs about self and the world that lead to alleviation and help prevent the relapse of mood episodes. Description: Verbalize any history of past and present suicidal thoughts and actions. Target Date: 2024-01-08 Frequency: Daily Modality: individual Progress: 80%  Related Problem: Develop healthy cognitive patterns and  beliefs about self and the world that lead to alleviation and help prevent the relapse of mood episodes. Description: Take prescribed medications as directed. Target Date: 2024-01-08 Frequency: Daily Modality: individual Progress: 90%  Related Problem: Develop healthy cognitive patterns and beliefs about self and the world that lead to alleviation and help prevent the relapse of mood episodes. Description: Discuss and resolve troubling personal and interpersonal issues. Target Date: 2024-01-08 Frequency: Daily Modality: individual Progress: 80% Resolve complicated grief       Target Date: 12-25       Progress: 30%  Client Response full compliance  Service Location Location, [address] [place], [place] [address] -virtual Service Code cpt [phone]  Emotion regulation skills  Related past to present  Rationally challenge thoughts or beliefs/cognitive restructuring  Identify/label emotions  Validate/empathize  Self care activities  Lifestyle change (exercise, nutrition)  Self-monitoring  Facilitate problem solving  Session notes:  f31.9 Bipolar Unspecified.  Goals: Mood stabilization, improved marital relationship, improve self esteem and elimination of self harm thoughts/tendencies. Also, patient struggles with leaving home comfortably and seeks to improve this ability. Target date for these goals is January, 2023. In addition, she needs to improve self care behaviors due to her weight and various medical conditions. Target date for this goal is Feb., 2022. Date revised to December, 2023 to allow time for behavioral implementation. Reports periodic outbursts of rage/anger. Seeks to minimize. Target date is December, 2023. Patient has realized improvement in all areas. Requests additional treatment to facilitate maintenance of progress. Goal date 12-25. Patient's husband died unexpectedly and she is suffering complicated grief. She needs additional counseling to address associated  issues and manage feelings of loss, despair and anxiety. Goal date 12-25.  Meds: Vyralar (6mg ),Nuedexta  (40+20mg /day), Adderall (20mg  3X/day), Xanax  (1mg , 3X/day), and Klonopin  (1mg  3X/day).   Patient agreed to be seen in provider's office.  Session note: [name] says she went out to dinner with her friend and he helped her wash her car. She says they are just friends and have never kissed. She is in a very good, upbeat mood. Going to friend Jean's house after session. She expressed concern that she got a call from disability. Has not yet spoken with them and will call tomorrow. She does say she has not slept well and has not been keeping her sleep diary. Encouraged her to get back in touch with sleep therapist, Veva Alma.                                                                     CONI ALM KERNS, PhD  Time:3:15p-4:00p 45 minutes.

## 2023-09-04 ENCOUNTER — Ambulatory Visit (INDEPENDENT_AMBULATORY_CARE_PROVIDER_SITE_OTHER): Payer: Medicare PPO | Admitting: Psychology

## 2023-09-04 DIAGNOSIS — F319 Bipolar disorder, unspecified: Secondary | ICD-10-CM | POA: Diagnosis not present

## 2023-09-04 NOTE — Progress Notes (Signed)
 [name] is a 53 y.o. female patient   Treatment Plan: Date: 09/04/2023  Diagnosis 296.80 (Unspecified bipolar or related disorder) [n/a]  Symptoms Depressed or irritable mood. (Status: maintained) -- No Description Entered  Diminished interest in or enjoyment of activities. (Status: maintained) -- No Description Entered  Lack of energy. (Status: maintained) -- No Description Entered  Low self-esteem. (Status: maintained) -- No Description Entered  Medication Status compliance  Safety none  If Suicidal or Homicidal State Action Taken: unspecified  Current Risk: low Medications Adderall (Dosage: 20mg  4X/day)  Klonapin (Dosage: 1mg  3X/day)  Lamictal  (Dosage: 200mg  2x/day)  Nuedexta  (Dosage: 20mg )  Vyralar (Dosage: 6mg )  Xanax  (Dosage: 1mg  3X/day)  Objectives Related Problem: Develop healthy cognitive patterns and beliefs about self and the world that lead to alleviation and help prevent the relapse of mood episodes. Description: State no longer having thoughts of self-harm. Target Date: 2024-01-08 Frequency: Daily Modality: individual Progress: 80%  Related Problem: Develop healthy cognitive patterns and beliefs about self and the world that lead to alleviation and help prevent the relapse of mood episodes. Description: Verbalize any history of past and present suicidal thoughts and actions. Target Date: 2024-01-08 Frequency: Daily Modality: individual Progress: 80%  Related Problem: Develop  healthy cognitive patterns and beliefs about self and the world that lead to alleviation and help prevent the relapse of mood episodes. Description: Take prescribed medications as directed. Target Date: 2024-01-08 Frequency: Daily Modality: individual Progress: 90%  Related Problem: Develop healthy cognitive patterns and beliefs about self and the world that lead to alleviation and help prevent the relapse of mood episodes. Description: Discuss and resolve troubling personal and interpersonal issues. Target Date: 2024-01-08 Frequency: Daily Modality: individual Progress: 80% Resolve complicated grief       Target Date: 12-25       Progress: 50%  Client Response full compliance  Service Location Location, [address] [place], [place] [address] -virtual Service Code cpt [phone]  Emotion regulation skills  Related past to present  Rationally challenge thoughts or beliefs/cognitive restructuring  Identify/label emotions  Validate/empathize  Self care activities  Lifestyle change (exercise, nutrition)  Self-monitoring  Facilitate problem solving  Session notes:  f31.9 Bipolar Unspecified.  Goals: Mood stabilization, improved marital relationship, improve self esteem and elimination of self harm thoughts/tendencies. Also, patient struggles with leaving home comfortably and seeks to improve this ability. Target date for these goals is January, 2023. In addition, she needs to improve self care behaviors due to her weight and various medical conditions. Target date for this goal is Feb., 2022. Date revised to December, 2023 to allow time for behavioral implementation. Reports periodic outbursts of rage/anger. Seeks to minimize. Target date is December, 2023. Patient has realized improvement in all areas. Requests additional treatment to facilitate maintenance of progress. Goal date 12-25. Patient's husband died unexpectedly and she is suffering complicated grief. She needs additional  counseling to address associated issues and manage feelings of loss, despair and anxiety. Goal date 12-25.  Meds: Vyralar (6mg ),Nuedexta  (40+20mg /day), Adderall (20mg  3X/day), Xanax  (1mg , 3X/day), and Klonopin  (1mg  3X/day).   Patient agreed to be seen in provider's office.  Session note: Haliey says she was up for 2 days straight and the slept 10 hours. She was up again all last night and says she is now exhausted. We talked about her continuing to meet with sleep specialist and keep her sleep diary.                          Her psychiatrist prescribed sleeping pills (Sonata) but she is not picking them up. She doesn't trust herself and doesn't want to take too many of them when feeling desperate. She maintains that she is safe and has been able to reach out when she feels vulnerable. States that if she does fill the prescription, she will take it to her friend Cy to hold them and only keep 1-2 doses to use as needed. Spent time with her friend TC this weekend watching movies. Says they have downloaded more to watch together.                                     CONI ALM KERNS, PhD  Time:2:15p-3:00p 45 minutes.

## 2023-09-11 ENCOUNTER — Ambulatory Visit: Admitting: Psychology

## 2023-09-11 DIAGNOSIS — F319 Bipolar disorder, unspecified: Secondary | ICD-10-CM

## 2023-09-11 NOTE — Progress Notes (Signed)
 [name] is a 53 y.o. female patient   Treatment Plan: Date: 09/11/2023  Diagnosis 296.80 (Unspecified bipolar or related disorder) [n/a]  Symptoms Depressed or irritable mood. (Status: maintained) -- No Description Entered  Diminished interest in or enjoyment of activities. (Status: maintained) -- No Description Entered  Lack of energy. (Status: maintained) -- No Description Entered  Low self-esteem. (Status: maintained) -- No Description Entered  Medication Status compliance  Safety none  If Suicidal or Homicidal State Action Taken: unspecified  Current Risk: low Medications Adderall (Dosage: 20mg  4X/day)  Klonapin (Dosage: 1mg  3X/day)  Lamictal  (Dosage: 200mg  2x/day)  Nuedexta  (Dosage: 20mg )  Vyralar (Dosage: 6mg )  Xanax  (Dosage: 1mg  3X/day)  Objectives Related Problem: Develop healthy cognitive patterns and beliefs about self and the world that lead to alleviation and help prevent the relapse of mood episodes. Description: State no longer having thoughts of self-harm. Target Date: 2024-01-08 Frequency: Daily Modality: individual Progress: 80%  Related Problem: Develop healthy cognitive patterns and beliefs about self and the world that lead to alleviation and help prevent the relapse of mood episodes. Description: Verbalize any history of past and present suicidal thoughts and actions. Target Date: 2024-01-08 Frequency: Daily Modality: individual Progress:  80%  Related Problem: Develop healthy cognitive patterns and beliefs about self and the world that lead to alleviation and help prevent the relapse of mood episodes. Description: Take prescribed medications as directed. Target Date: 2024-01-08 Frequency: Daily Modality: individual Progress: 90%  Related Problem: Develop healthy cognitive patterns and beliefs about self and the world that lead to alleviation and help prevent the relapse of mood episodes. Description: Discuss and resolve troubling personal and interpersonal issues. Target Date: 2024-01-08 Frequency: Daily Modality: individual Progress: 80% Resolve complicated grief       Target Date: 12-25       Progress: 50%  Client Response full compliance  Service Location Location, [address] [place], [place] [address] -virtual Service Code cpt [phone]  Emotion regulation skills  Related past  to present  Rationally challenge thoughts or beliefs/cognitive restructuring  Identify/label emotions  Validate/empathize  Self care activities  Lifestyle change (exercise, nutrition)  Self-monitoring  Facilitate problem solving  Session notes:  f31.9 Bipolar Unspecified.  Goals: Mood stabilization, improved marital relationship, improve self esteem and elimination of self harm thoughts/tendencies. Also, patient struggles with leaving home comfortably and seeks to improve this ability. Target date for these goals is January, 2023. In addition, she needs to improve self care behaviors due to her weight and various medical conditions. Target date for this goal is Feb., 2022. Date revised to December, 2023 to allow time for behavioral implementation. Reports periodic outbursts of rage/anger. Seeks to minimize. Target date is December, 2023. Patient has realized improvement in all areas. Requests additional treatment to facilitate maintenance of progress. Goal date 12-25. Patient's husband died unexpectedly and she is suffering  complicated grief. She needs additional counseling to address associated issues and manage feelings of loss, despair and anxiety. Goal date 12-25.  Meds: Vyralar (6mg ),Nuedexta  (40+20mg /day), Adderall (20mg  3X/day), Xanax  (1mg , 3X/day), and Klonopin  (1mg  3X/day).   Patient agreed to a video Public affairs consultant) session and understands limitations of platform. She is at home and provider in his office.   Session note: [name] says that the medicine is not working at all. She will fall asleep eventually, but it takes a long time. Will meet with Terri tomorrow and discuss. She will also reach out to Dr. Vincente to provide her a report on the meds and her current emotional state. She talked about being very nervous about money and is still waiting for her husband;s pension payout. She says that she is getting tired of her situation, emotionally and physically. She sees pulmonologist next Thursday and a GI doc from insurance company on the 28th.                                       CONI ALM KERNS, PhD  Time:3:15p-4:00p 45 minutes.

## 2023-09-12 ENCOUNTER — Ambulatory Visit (INDEPENDENT_AMBULATORY_CARE_PROVIDER_SITE_OTHER): Admitting: Psychology

## 2023-09-12 DIAGNOSIS — F5105 Insomnia due to other mental disorder: Secondary | ICD-10-CM | POA: Diagnosis not present

## 2023-09-12 DIAGNOSIS — F319 Bipolar disorder, unspecified: Secondary | ICD-10-CM

## 2023-09-12 NOTE — Progress Notes (Signed)
 [facility] Counselor/Therapist Progress Note  Patient ID: [name], MRN: [id number],    Date: 09/12/2023  Time Spent: 3:00pm-3:45pm   45 minutes   Treatment Type: Individual Therapy  Reported Symptoms: depression  Mental Status Exam: Appearance:  Casual     Behavior: Appropriate  Motor: Normal  Speech/Language:  Normal Rate  Affect: Appropriate  Mood: depressed  Thought process: normal  Thought content:   WNL  Sensory/Perceptual disturbances:   WNL  Orientation: oriented to person, place, time/date, and situation  Attention: Fair  Concentration: Fair  Memory: WNL  Fund of knowledge:  Fair  Insight:   Fair  Judgment:  Fair  Impulse Control: Fair   Risk Assessment: Danger to Self:  No Self-injurious Behavior: No Danger to Others: No Duty to Warn:no Physical Aggression / Violence:No  Access to Firearms a concern: No  Gang Involvement:No   Subjective: Pt present for face-to-face individual therapy via video.  Pt consents to telehealth video session and is aware of limitations and benefits of virtual sessions.   Location of pt: home Location of therapist: home office.   Pt states her sleep has been sporadic.  She slept today from 7:00am-3:00pm.   Even though this was 8 hours of sleep pt states she still feels tired.  Pt states she has always needed more sleep and enjoyed sleeping a lot.   Addressed pt's goals for her sleep.   She would like for her sleep to be more consistent but feels that currently she would like to sleep during the day instead of the night so that she does not have to deal with people.    Pt states she is having difficulty engaging in any activities bc nothing interests her.   Pt states she has agoraphobia and has not been in a store for 30 years.  She only goes out for doctors' appointments.   Pt sees Dr. [name] for therapy to address pt's mental health issues.  Educated pt about CBT-I principals and addressed how  important it is for pt to agree to a consistent sleep schedule.   Pt states it is painful to try to force herself to stay up so she resorts to sleeping whenever she can sleep.   This has led to a very inconsistent sleep schedule for pt which is frustrating to her.  Worked with pt on how she can adhere to completing daily sleep diaries.   She states she will make an effort to complete the diaries and email them to therapist the day before our next session.  Plan to meet in two weeks.    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  Pt participated in setting treatment goals.   Pt wants to improve her sleep and get her nights and days back on track.  Plan to meet with pt every two weeks for CBT-I.   Pt is in agreement with treatment plan.    Treatment Plan Client Abilities/Strengths  Pt is motivated for therapy.  Client Treatment Preferences  individual therapy  Client Statement of Needs  Improve sleep and coping skills.  Symptoms  Complains of difficulty falling asleep. Complains of difficulty remaining asleep. Problems Addressed  Sleep Disturbance Goals 1. Feel refreshed and energetic during wakeful hours. 2. Restore restful sleep pattern. Objective Learn and implement stimulus control strategies to establish a consistent sleep-wake rhythm. Target Date: 2024-07-09  Frequency: Biweekly Progress: 10 Modality: individual  Related Interventions 1. Discuss with the client the rationale for stimulus control strategies to establish a consistent sleep-wake cycle (see Behavioral Treatments for Sleep Disorders by Perlis, Aloia, and Juanice). 2. Teach the client stimulus control techniques (e.g., lie down to sleep only when sleepy; do not use the bed for activities like watching television, reading, listening to music, but only for sleep or sexual activity; get out of bed if sleep doesn't arrive soon after retiring; lie back  down when sleepy; set alarm to the same wake-up time every morning regardless of sleep time or quality; do not nap during the day); assign consistent implementation. 3. Instruct the client to move activities associated with arousal and activation from the bedtime ritual to other times during the day (e.g., reading stimulating content, reviewing day's events, planning for next day, watching disturbing television). 4. Monitor the client's sleep patterns and compliance with stimulus control instructions; problem-solve obstacles and reinforce successful, consistent implementation. Objective Learn and implement a sleep restriction method to increase sleep efficiency. Target Date: 2024-07-09  Frequency: Biweekly Progress: 10 Modality: individual Related Interventions 5. Use a sleep restriction therapy approach in which the amount of time in bed is reduced to match the amount of time the patient typically sleeps (e.g., from 8 hours to 5), thus inducing systematic sleep deprivation; periodically adjust sleep time upward until an optimal sleep duration is reached. Objective Learn and implement calming skills for use at bedtime. Target Date: 2024-07-09  Frequency: Biweekly Progress: 10 Modality: individual Related Interventions 6. Teach the client relaxation skills (e.g., progressive muscle relaxation, guided imagery, slow diaphragmatic breathing); teach the client how to apply these skills to facilitate relaxation and sleep at bedtime (see Bedtime Relaxation Techniques by Otto armin Carbon). 7. Refer the client for or conduct biofeedback training to strengthen the client's successful relaxation response. Objective Practice good sleep hygiene. Target Date: 2024-07-09  Frequency: Biweekly Progress: 10 Modality: individual Related Interventions 8. Instruct the client in sleep hygiene practices such as restricting excessive liquid intake, spicy late night snacks, or heavy evening meals; exercising regularly,  but not within 3 - 4 hours of bedtime; minimizing or avoiding caffeine, alcohol, tobacco, and stimulant intake (or assign Sleep Pattern Record in the Adult Psychotherapy Homework Planner by [facility]). Diagnosis Insomnia F51.05 Conditions For Discharge Achievement of treatment goals and objectives   Treatment Plan Client Abilities/Strengths  Pt is motivated for therapy.  Client Treatment Preferences  Individual therapy.  Client Statement of Needs  Improve coping skills.  Symptoms  Depressed or irritable mood. Diminished interest in or enjoyment of activities. Lack of energy. Sleeplessness or hypersomnia. Social withdrawal. Problems Addressed  Bipolar Disorder - Depression  Goals 1. Normalize sleep patterns and address how mood impacts sleep disturbance.  2. Develop healthy thinking patterns and beliefs about self, others, and the world that lead to the alleviation and help prevent the relapse of depression. 3. Normalize energy level and return to usual activities, good judgment, stable mood, more realistic expectations, and goal-directed behavior. . Objective Maintain a pattern of regular rhythm to daily activities. Target Date: 2024-07-09 Frequency: Biweekly  Progress: 10 Modality: individual  Related Interventions Engage the client in a balanced schedule of behavioral activation by scheduling rewarding activities while not over-stimulating; use activity and mood monitoring to facilitate an optimal balance of activity; reinforce success. Teach the client about the importance of good sleep hygiene. assess and intervene accordingly. Assist the client in establishing a more routine pattern of daily activities such as sleeping, eating, solitary and social  activities, and exercise; use and review a form to schedule, assess, and modify these activities so that they occur in a predictable rhythm every day. Conduct Interpersonal and Social Rhythm Therapy beginning with the assessment of the  client's daily activities using an interview and the Social Rhythm Metric. Objective Identify and replace thoughts and behaviors that trigger depressive symptoms. Target Date: 2024-07-09 Frequency: Biweekly  Progress: 10 Modality: individual  Related Interventions Teach the client cognitive-behavioral coping and relapse prevention skills including delaying impulsive actions, structured scheduling of daily activities, keeping a regular sleep routine, avoiding unrealistic goal striving, using relaxation procedures, identifying and avoiding episode triggers such as stimulant drug use, alcohol consumption, breaking sleep routine, or exposing self to high stress. Assign the client a homework exercise in which he/she identifies self-talk reflective of mania, biases in the self-talk, alternatives. review and reinforce success, providing corrective feedback toward improvement. Use cognitive therapy techniques to explore and educate the client about cognitive biases that trigger his/her elevated or depressive mood. Diagnosis F31.9 Conditions For Discharge Achievement of treatment goals and objectives   Veva Alma, LCSW

## 2023-09-16 ENCOUNTER — Ambulatory Visit (INDEPENDENT_AMBULATORY_CARE_PROVIDER_SITE_OTHER): Admitting: Psychology

## 2023-09-16 DIAGNOSIS — F319 Bipolar disorder, unspecified: Secondary | ICD-10-CM | POA: Diagnosis not present

## 2023-09-16 NOTE — Progress Notes (Signed)
 [name] is a 53 y.o. female patient   Treatment Plan: Date: 09/16/2023  Diagnosis 296.80 (Unspecified bipolar or related disorder) [n/a]  Symptoms Depressed or irritable mood. (Status: maintained) -- No Description Entered  Diminished interest in or enjoyment of activities. (Status: maintained) -- No Description Entered  Lack of energy. (Status: maintained) -- No Description Entered  Low self-esteem. (Status: maintained) -- No Description Entered  Medication Status compliance  Safety none  If Suicidal or Homicidal State Action Taken: unspecified  Current Risk: low Medications Adderall (Dosage: 20mg  4X/day)  Klonapin (Dosage: 1mg  3X/day)  Lamictal  (Dosage: 200mg  2x/day)  Nuedexta  (Dosage: 20mg )  Vyralar (Dosage: 6mg )  Xanax  (Dosage: 1mg  3X/day)  Objectives Related Problem: Develop healthy cognitive patterns and beliefs about self and the world that lead to alleviation and help prevent the relapse of mood episodes. Description: State no longer having thoughts of self-harm. Target Date: 2024-01-08 Frequency: Daily Modality: individual Progress: 80%  Related Problem: Develop healthy cognitive patterns and beliefs about self and the world that lead to alleviation and help prevent the relapse of mood episodes. Description: Verbalize any history of past and present suicidal thoughts and actions. Target Date: 2024-01-08 Frequency:  Daily Modality: individual Progress: 80%  Related Problem: Develop healthy cognitive patterns and beliefs about self and the world that lead to alleviation and help prevent the relapse of mood episodes. Description: Take prescribed medications as directed. Target Date: 2024-01-08 Frequency: Daily Modality: individual Progress: 90%  Related Problem: Develop healthy cognitive patterns and beliefs about self and the world that lead to alleviation and help prevent the relapse of mood episodes. Description: Discuss and resolve troubling personal and interpersonal issues. Target Date: 2024-01-08 Frequency: Daily Modality: individual Progress: 80% Resolve complicated grief       Target Date: 12-25       Progress: 50%  Client Response full compliance  Service Location Location, [address]  [place], [place] [address] -virtual Service Code cpt [phone]  Emotion regulation skills  Related past to present  Rationally challenge thoughts or beliefs/cognitive restructuring  Identify/label emotions  Validate/empathize  Self care activities  Lifestyle change (exercise, nutrition)  Self-monitoring  Facilitate problem solving  Session notes:  f31.9 Bipolar Unspecified.  Goals: Mood stabilization, improved marital relationship, improve self esteem and elimination of self harm thoughts/tendencies. Also, patient struggles with leaving home comfortably and seeks to improve this ability. Target date for these goals is January, 2023. In addition, she needs to improve self care behaviors due to her weight and various medical conditions. Target date for this goal is Feb., 2022. Date revised to December, 2023 to allow time for behavioral implementation. Reports periodic outbursts of rage/anger. Seeks to minimize. Target date is December, 2023. Patient has realized improvement in all areas. Requests additional treatment to facilitate maintenance of progress. Goal date 12-25. Patient's husband died  unexpectedly and she is suffering complicated grief. She needs additional counseling to address associated issues and manage feelings of loss, despair and anxiety. Goal date 12-25.  Meds: Vyralar (6mg ),Nuedexta  (40+20mg /day), Adderall (20mg  3X/day), Xanax  (1mg , 3X/day), and Klonopin  (1mg  3X/day).   Patient agreed to a video Public affairs consultant) session and understands limitations of platform. She is at home and provider in his office.   Session note: [name] continues to struggle with sleep, claiming medicine is not working. Asked her to contact Dr. Vincente to discuss. She went to sleep at 3am last night and just woke up. We discussed how to try and keep momentum going and get to sleep tonight. She took all her prescribed meds, as they were prescribed, at one time before bedtime. Told her she must get approval from Dr. Vincente in order to continue that pattern. Brother has shingles and [name] has Chicken Pox. She has her pulmonary appoint on Thursday. She stopped using her inhaler a few years ago, which she suspects is related to her chronic                                          CONI ALM KERNS, PhD  Time:3:15p-4:00p 45 minutes.

## 2023-09-17 ENCOUNTER — Ambulatory Visit: Admitting: Psychology

## 2023-09-19 ENCOUNTER — Encounter: Payer: Self-pay | Admitting: Adult Health

## 2023-09-19 ENCOUNTER — Ambulatory Visit (INDEPENDENT_AMBULATORY_CARE_PROVIDER_SITE_OTHER)

## 2023-09-19 ENCOUNTER — Ambulatory Visit: Payer: Self-pay | Admitting: Adult Health

## 2023-09-19 ENCOUNTER — Ambulatory Visit: Admitting: Adult Health

## 2023-09-19 VITALS — BP 128/80 | HR 64 | Ht 64.5 in | Wt 259.6 lb

## 2023-09-19 DIAGNOSIS — J45901 Unspecified asthma with (acute) exacerbation: Secondary | ICD-10-CM

## 2023-09-19 DIAGNOSIS — J4531 Mild persistent asthma with (acute) exacerbation: Secondary | ICD-10-CM

## 2023-09-19 DIAGNOSIS — J309 Allergic rhinitis, unspecified: Secondary | ICD-10-CM | POA: Diagnosis not present

## 2023-09-19 DIAGNOSIS — J45909 Unspecified asthma, uncomplicated: Secondary | ICD-10-CM | POA: Diagnosis not present

## 2023-09-19 DIAGNOSIS — Z981 Arthrodesis status: Secondary | ICD-10-CM | POA: Diagnosis not present

## 2023-09-19 DIAGNOSIS — G4733 Obstructive sleep apnea (adult) (pediatric): Secondary | ICD-10-CM | POA: Diagnosis not present

## 2023-09-19 DIAGNOSIS — J4520 Mild intermittent asthma, uncomplicated: Secondary | ICD-10-CM

## 2023-09-19 MED ORDER — AZITHROMYCIN 250 MG PO TABS: ORAL_TABLET | ORAL | 0 refills | Status: AC

## 2023-09-19 MED ORDER — FLUCONAZOLE 150 MG PO TABS: 150.0000 mg | ORAL_TABLET | Freq: Every day | ORAL | 0 refills | Status: AC

## 2023-09-19 MED ORDER — TRELEGY ELLIPTA 100-62.5-25 MCG/ACT IN AEPB: 1.0000 | INHALATION_SPRAY | Freq: Every day | RESPIRATORY_TRACT | Status: AC

## 2023-09-19 MED ORDER — PREDNISONE 10 MG PO TABS
ORAL_TABLET | ORAL | 0 refills | Status: AC
Start: 2023-09-19 — End: ?

## 2023-09-19 MED ORDER — ALBUTEROL SULFATE (2.5 MG/3ML) 0.083% IN NEBU
2.5000 mg | INHALATION_SOLUTION | Freq: Once | RESPIRATORY_TRACT | Status: AC
Start: 2023-09-19 — End: ?

## 2023-09-19 NOTE — Patient Instructions (Addendum)
 Zpack take as directed.  Prednisone  taper over next week.  Begin Trelegy 1 puff daily until gone.  Xopenex  inhaler As needed  -Add spacer.  Chest xray today.   Diflucan  150mg  x 1 if develop yeast infection .   Continue on CPAP At bedtime   Keep up good work .  Work on healthy weight loss.  Do not drive if sleepy .   Follow up with Dr. Jude or Cael Worth NP in 4-6 weeks with PFT

## 2023-09-19 NOTE — Progress Notes (Signed)
 Patient seen in the office today and instructed on use of Trelegy and spacer (with xopenex ).  Patient expressed understanding and demonstrated technique.

## 2023-09-19 NOTE — Progress Notes (Signed)
 @Patient  ID: [name], female    DOB: 07-15-1970, 53 y.o.   MRN: [id number]  Chief Complaint  Patient presents with   Sleep Apnea    Referring provider: [place], Betty G, MD  HPI: 53 year old female never smoker followed for obstructive sleep apnea, OHS, asthma Medical history is significant for diabetes and bipolar disorder  TEST/EVENTS :  04/2010 ABG was 7.40/49/63 on 4 L [place]. SABRA    PSG  2002 >> severe OSA  with AHI 40/h, lowest desatn to 52% >> 8 cm H2O - wt 325 lbs   07/2015 CPAP titration study >> required 10-16cm CPAP, higher pressure when supine/ REM sleep.     PFTs 06/2011 - no obstruction, mild restriction, FVC 78%, DLCO 64%   09/19/2023 Follow up ; OSA/OHS, Asthma  Discussed the use of AI scribe software for clinical note transcription with the patient, who gave verbal consent to proceed.  History of Present Illness [name] is a 53 year old female with asthma who presents with a flare-up of asthma symptoms.  She has been experiencing cold-like symptoms for the past week, uncertain if they are due to a cold or allergies. She has been using Flonase  and previously used Claritin  but ran out. No purulent nasal discharge, but she is coughing up a little bit of mucus. No fever, sore throat, or vomiting. She reports nasal congestion and sinus tenderness.  Says that she feels very bad has significant cough and wheezing and shortness of breath.  She has not been using her Xopenex  inhaler due to experiencing thrush every time she uses it, despite brushing and rinsing her mouth. She has not required acute asthma treatment for a long time. .  She has a history of sleep apnea, which is well-controlled with a CPAP machine. She uses a nasal pillow mask and reports excellent compliance, using it every night for about ten hours.  Feels that she benefits from CPAP.  CPAP download shows excellent 100% compliance.  Daily average usage at 10 hours.  She is on auto CPAP 10 to 16  cm H2O.  AHI 5.2/hour.  Daily average pressure at 15.7 cm H2O.  She has been on Adderall for ADHD and describes herself as sedentary during the daytime.  She is on several sedating medications including trazodone, Sonata, Xanax , Lyrica   She recalls a previous hospitalization in November 2023, where a chest x-ray showed something near the costophrenic angle, and she was advised to follow up with another chest x-ray. She has not smoked    Allergies  Allergen Reactions   Savella [Milnacipran Hcl] Other (See Comments)    mania    Immunization History  Administered Date(s) Administered   Influenza, High Dose Seasonal PF 01/25/2021   Influenza,inj,Quad PF,6+ Mos 11/14/2021   PFIZER(Purple Top)SARS-COV-2 Vaccination 04/30/2019, 05/28/2019, 11/30/2019   Pneumococcal Polysaccharide-23 11/14/2021   Tdap 03/18/2014   Unspecified SARS-COV-2 Vaccination 05/30/2019, 06/30/2019, 12/30/2019    Past Medical History:  Diagnosis Date   Acute systolic congestive heart failure (HCC)    Agoraphobia    Anxiety    panic attacks   Arthritis    osteoarthritis bilal. knees   Asthma    no per PFT 7/13; reports does not have asthma   Bipolar disorder (HCC)    Carpal tunnel syndrome of left wrist 05/2011   Being evaluated for MS   Cor pulmonale (HCC)    Depression    Depression    History of pleural effusion    Hyperlipidemia  Hypertension    Hypoxemia    history of - no home O2   IBS (irritable bowel syndrome)    Morbid obesity (HCC)    Obesity hypoventilation syndrome (HCC)    Perforation of sigmoid colon due to diverticulitis 12/13/2021   Pneumonia    Pre-diabetes    Prediabetes    Seasonal allergies    current runny nose   Seizures (HCC)    febrile seizure x 1 as a child. Several times   Sleep apnea sleep study 07/02/2010   uses CPAP nightly   Spinal stenosis in cervical region    Urinary incontinence     Tobacco History: Social History   Tobacco Use  Smoking Status Never   Smokeless Tobacco Never   Counseling given: Not Answered   Outpatient Medications Prior to Visit  Medication Sig Dispense Refill   acetaminophen  (TYLENOL ) 325 MG tablet Take 2 tablets (650 mg total) by mouth every 6 (six) hours as needed for mild pain (or Fever >/= 101).     ALPRAZolam  (XANAX  XR) 1 MG 24 hr tablet Take 1 mg by mouth in the morning and at bedtime. Taking twice daily along with 1mg  tablet     amphetamine -dextroamphetamine  (ADDERALL) 20 MG tablet Take 20 mg by mouth in the morning, at noon, in the evening, and at bedtime.      Ascorbic Acid  (VITAMIN C ) 500 MG tablet Take 1,000 mg by mouth daily.     fluticasone  (FLONASE ) 50 MCG/ACT nasal spray Place 2 sprays into both nostrils daily for 7 days. 16 g 0   lamoTRIgine  (LAMICTAL ) 200 MG tablet Take 200 mg by mouth daily.     levalbuterol  (XOPENEX  HFA) 45 MCG/ACT inhaler Inhale 2 puffs into the lungs every 6 (six) hours as needed for wheezing. 45 g 3   lidocaine  (LIDODERM ) 5 % PLACE 1 PATCH ONTO THE SKIN DAILY AS NEEDED FOR KNEE PAIN. REMOVE AND DISCARD PATCH WITHIN 12 HOURS OR AS DIRECTED BY DOCTOR. 30 patch 0   losartan  (COZAAR ) 25 MG tablet TAKE 1 TABLET TWICE DAILY 180 tablet 3   Lurasidone  HCl 120 MG TABS Take 120 mg by mouth daily in the afternoon.     LYRICA  300 MG capsule Take 300 mg by mouth 2 (two) times daily.      Multiple Vitamin (MULTIVITAMIN) tablet Take 1 tablet by mouth daily.     NUEDEXTA  20-10 MG CAPS Take 1 capsule by mouth every 12 (twelve) hours.   12   Sodium Chloride  Flush (NORMAL SALINE FLUSH) 0.9 % SOLN Use to flush drain 1-2 times daily. 600 mL 1   traZODone (DESYREL) 100 MG tablet Take 200 mg by mouth at bedtime.     zaleplon (SONATA) 10 MG capsule Take 20 mg by mouth at bedtime.     albuterol  (VENTOLIN  HFA) 108 (90 Base) MCG/ACT inhaler Inhale 2 puffs into the lungs every 6 (six) hours as needed for wheezing or shortness of breath. (Patient not taking: Reported on 09/19/2023) 20.1 g 0   ALPRAZolam   (XANAX ) 1 MG tablet Take 1 mg by mouth 2 (two) times daily. (Patient not taking: Reported on 09/19/2023)     aspirin  81 MG tablet Take 1 tablet (81 mg total) by mouth daily. (Patient not taking: Reported on 09/19/2023) 30 tablet    atorvastatin  (LIPITOR) 20 MG tablet TAKE 1 TABLET BY MOUTH EVERY DAY IN THE EVENING (Patient not taking: Reported on 09/19/2023) 90 tablet 4   b complex vitamins tablet Take 1 tablet by  mouth daily. (Patient not taking: Reported on 09/19/2023)     Blood Glucose Monitoring Suppl (ACCU-CHEK AVIVA PLUS) w/Device KIT As directed. (Patient not taking: Reported on 09/19/2023) 1 kit 0   Calcium  Citrate (CITRACAL PO) Take 2 tablets by mouth daily. (Patient not taking: Reported on 09/19/2023)     Cholecalciferol  (VITAMIN D3) 5000 UNITS TABS Take 10,000 Units by mouth in the morning and at bedtime. (Patient not taking: Reported on 09/19/2023)     Chromium  Picolinate 500 MCG TABS Take 500 mcg by mouth daily.  (Patient not taking: Reported on 09/19/2023)     diclofenac  Sodium (VOLTAREN ) 1 % GEL Apply 2 g topically 3 (three) times daily as needed (knee pain). (Patient not taking: Reported on 09/19/2023)     furosemide  (LASIX ) 40 MG tablet Take 1 tablet (40 mg total) by mouth 2 (two) times daily. (Patient not taking: Reported on 09/19/2023) 30 tablet 0   loratadine  (CLARITIN ) 10 MG tablet Take 1 tablet (10 mg total) by mouth daily for 10 days. (Patient not taking: Reported on 09/19/2023) 10 tablet 0   MAGNESIUM  PO Take 500 mg by mouth daily.  (Patient not taking: Reported on 09/19/2023)     Omega-3 Fatty Acids (FISH OIL) 1200 MG CAPS Take 2,400 mg by mouth daily.  (Patient not taking: Reported on 09/19/2023)     potassium chloride  SA (KLOR-CON  M20) 20 MEQ tablet Take 1 tablet (20 mEq total) by mouth daily. (Patient not taking: Reported on 09/19/2023) 90 tablet 4   VRAYLAR  6 MG CAPS Take 6 mg by mouth every evening.  (Patient not taking: Reported on 09/19/2023)     Zinc  50 MG CAPS Take 50 mg by mouth in  the morning and at bedtime. (Patient not taking: Reported on 09/19/2023)     No facility-administered medications prior to visit.     Review of Systems:   Constitutional:   No  weight loss, night sweats,  Fevers, chills, fatigue, or  lassitude.  HEENT:   No headaches,  Difficulty swallowing,  Tooth/dental problems, or  Sore throat,                No sneezing, itching, ear ache, +nasal congestion, post nasal drip,   CV:  No chest pain,  Orthopnea, PND, swelling in lower extremities, anasarca, dizziness, palpitations, syncope.   GI  No heartburn, indigestion, abdominal pain, nausea, vomiting, diarrhea, change in bowel habits, loss of appetite, bloody stools.   Resp:   No chest wall deformity  Skin: no rash or lesions.  GU: no dysuria, change in color of urine, no urgency or frequency.  No flank pain, no hematuria   MS:  No joint pain or swelling.  No decreased range of motion.  No back pain.    Physical Exam  BP 128/80   Pulse 64   Ht 5' 4.5 (1.638 m)   Wt 259 lb 9.6 oz (117.8 kg)   SpO2 95% Comment: RA  BMI 43.87 kg/m   GEN: A/Ox3; pleasant , NAD, well nourished    HEENT:  [place]/AT,   NOSE-clear, THROAT-clear, no lesions, no postnasal drip or exudate noted.   NECK:  Supple w/ fair ROM; no JVD; normal carotid impulses w/o bruits; no thyromegaly or nodules palpated; no lymphadenopathy.    RESP coarse rhonchi bilaterally with few expiratory wheezes , no dullness to percussion  CARD:  RRR, no m/r/g, no peripheral edema, pulses intact, no cyanosis or clubbing.  GI:   Soft & nt; nml bowel sounds; no  organomegaly or masses detected.   Musco: Warm bil, no deformities or joint swelling noted.   Neuro: alert, no focal deficits noted.    Skin: Warm, no lesions or rashes    Lab Results:      Imaging: No results found.  Administration History     None           No data to display          No results found for: NITRICOXIDE      Assessment & Plan:    No problem-specific Assessment & Plan notes found for this encounter.  Assessment and Plan Assessment & Plan Asthma exacerbation  -acute asthmatic bronchitic exacerbation She is experiencing an asthma exacerbation with wheezing and cough.  Begin prednisone  taper.  Trial of Trelegy inhaler x 1 week sample given.  Advised to use her Xopenex  inhaler for rescue.  Spacer was given to help with usage.  Albuterol  nebulizer treatment given in the office. Advise her to brush, rinse, and gargle after using Trelegy to prevent thrush.  Chest x-ray is pending.  Asthma action plan discussed..  Patient's begin a Z-Pak.  (Per patient request for prescription for fluconazole  for potential yeast infection as she says she always gets one every time she takes an antibiotic and/or steroid.) Diflucan  150 x 1 prescription sent. Check PFT on return   Obstructive sleep apnea, stable on CPAP   Her obstructive sleep apnea is well-controlled with CPAP.  She has perceived benefit.  She shows good compliance and satisfaction with the nasal pillow mask and reports no issues with the current CPAP setup.   Allergic Rhinitis -Claritin  and Flonase  As needed    Hx of Anxiety and Insomnia-on multiple sedating medications.  Use caution with sedating medications with underlying sleep apnea.   Plan  Patient Instructions  Zpack take as directed.  Prednisone  taper over next week.  Begin Trelegy 1 puff daily until gone.  Xopenex  inhaler As needed  -Add spacer.  Chest xray today.   Diflucan  150mg  x 1 if develop yeast infection .   Continue on CPAP At bedtime   Keep up good work .  Work on healthy weight loss.  Do not drive if sleepy .   Follow up with Dr. Jude or Regis Hinton NP in 4-6 weeks with PFT      I spent   42 minutes dedicated to the care of this patient on the date of this encounter to include pre-visit review of records, face-to-face time with the patient discussing conditions above, post visit ordering of testing,  clinical documentation with the electronic health record, making appropriate referrals as documented, and communicating necessary findings to members of the patients care team.    Madelin Stank, NP 09/19/2023

## 2023-09-25 ENCOUNTER — Ambulatory Visit (INDEPENDENT_AMBULATORY_CARE_PROVIDER_SITE_OTHER): Admitting: Psychology

## 2023-09-25 DIAGNOSIS — F319 Bipolar disorder, unspecified: Secondary | ICD-10-CM | POA: Diagnosis not present

## 2023-09-25 NOTE — Progress Notes (Signed)
 [name] is a 53 y.o. female patient   Treatment Plan: Date: 09/25/2023  Diagnosis 296.80 (Unspecified bipolar or related disorder) [n/a]  Symptoms Depressed or irritable mood. (Status: maintained) -- No Description Entered  Diminished interest in or enjoyment of activities. (Status: maintained) -- No Description Entered  Lack of energy. (Status: maintained) -- No Description Entered  Low self-esteem. (Status: maintained) -- No Description Entered  Medication Status compliance  Safety none  If Suicidal or Homicidal State Action Taken: unspecified  Current Risk: low Medications Adderall (Dosage: 20mg  4X/day)  Klonapin (Dosage: 1mg  3X/day)  Lamictal  (Dosage: 200mg  2x/day)  Nuedexta  (Dosage: 20mg )  Vyralar (Dosage: 6mg )  Xanax  (Dosage: 1mg  3X/day)  Objectives Related Problem: Develop healthy cognitive patterns and beliefs about self and the world that lead to alleviation and help prevent the relapse of mood episodes. Description: State no longer having thoughts of self-harm. Target Date: 2024-01-08 Frequency: Daily Modality: individual Progress: 80%  Related Problem: Develop healthy cognitive patterns and beliefs about self and the world that lead to alleviation and help prevent the relapse of mood episodes. Description: Verbalize any history of past and present suicidal thoughts and actions. Target Date:  2024-01-08 Frequency: Daily Modality: individual Progress: 80%  Related Problem: Develop healthy cognitive patterns and beliefs about self and the world that lead to alleviation and help prevent the relapse of mood episodes. Description: Take prescribed medications as directed. Target Date: 2024-01-08 Frequency: Daily Modality: individual Progress: 90%  Related Problem: Develop healthy cognitive patterns and beliefs about self and the world that lead to alleviation and help prevent the relapse of mood episodes. Description: Discuss and resolve troubling personal and interpersonal issues. Target Date: 2024-01-08 Frequency: Daily Modality: individual Progress: 80% Resolve complicated grief       Target Date: 12-25       Progress:  50%  Client Response full compliance  Service Location Location, [address] [place], [place] [address] -virtual Service Code cpt [phone]  Emotion regulation skills  Related past to present  Rationally challenge thoughts or beliefs/cognitive restructuring  Identify/label emotions  Validate/empathize  Self care activities  Lifestyle change (exercise, nutrition)  Self-monitoring  Facilitate problem solving  Session notes:  f31.9 Bipolar Unspecified.  Goals: Mood stabilization, improved marital relationship, improve self esteem and elimination of self harm thoughts/tendencies. Also, patient struggles with leaving home comfortably and seeks to improve this ability. Target date for these goals is January, 2023. In addition, she needs to improve self care behaviors due to her weight and various medical conditions. Target date for this goal is Feb., 2022. Date revised to December, 2023 to allow time for behavioral implementation. Reports periodic outbursts of rage/anger. Seeks to minimize. Target date is December, 2023. Patient has realized improvement in all areas. Requests additional treatment to facilitate maintenance of progress. Goal date  12-25. Patient's husband died unexpectedly and she is suffering complicated grief. She needs additional counseling to address associated issues and manage feelings of loss, despair and anxiety. Goal date 12-25.  Meds: Vyralar (6mg ),Nuedexta  (40+20mg /day), Adderall (20mg  3X/day), Xanax  (1mg , 3X/day), and Klonopin  (1mg  3X/day).   Patient agreed to a video Public affairs consultant) session and understands limitations of platform. She is at home and provider in his office.   Session note: [name] says sleep is erratic in that she will be up all night and day followed by being able to sleep all night. She says that her body hurts all over. She had her appointment with the pulmonologist. She had a breathing treatment, had a chest x-ray, got an anti-biotic and prescription for Trelegy.Told to come back in 6-8 weeks. Says she will schedule the appointment. She is scheduled for a general medical exam for her disability tomorrow. Claims she is not nervous.                                               CONI ALM KERNS, PhD  Time:3:15p-4:00p 45 minutes.

## 2023-10-01 ENCOUNTER — Ambulatory Visit (INDEPENDENT_AMBULATORY_CARE_PROVIDER_SITE_OTHER): Admitting: Psychology

## 2023-10-01 ENCOUNTER — Ambulatory Visit: Admitting: Psychology

## 2023-10-01 DIAGNOSIS — F319 Bipolar disorder, unspecified: Secondary | ICD-10-CM | POA: Diagnosis not present

## 2023-10-01 NOTE — Progress Notes (Signed)
 [name] is a 53 y.o. female patient   Treatment Plan: Date: 10/01/2023  Diagnosis 296.80 (Unspecified bipolar or related disorder) [n/a]  Symptoms Depressed or irritable mood. (Status: maintained) -- No Description Entered  Diminished interest in or enjoyment of activities. (Status: maintained) -- No Description Entered  Lack of energy. (Status: maintained) -- No Description Entered  Low self-esteem. (Status: maintained) -- No Description Entered  Medication Status compliance  Safety none  If Suicidal or Homicidal State Action Taken: unspecified  Current Risk: low Medications Adderall (Dosage: 20mg  4X/day)  Klonapin (Dosage: 1mg  3X/day)  Lamictal  (Dosage: 200mg  2x/day)  Nuedexta  (Dosage: 20mg )  Vyralar (Dosage: 6mg )  Xanax  (Dosage: 1mg  3X/day)  Objectives Related Problem: Develop healthy cognitive patterns and beliefs about self and the world that lead to alleviation and help prevent the relapse of mood episodes. Description: State no longer having thoughts of self-harm. Target Date: 2024-01-08 Frequency: Daily Modality: individual Progress: 80%  Related Problem: Develop healthy cognitive patterns and beliefs about self and the world that lead to alleviation and help prevent the relapse of mood episodes. Description: Verbalize any history of past and present suicidal thoughts and actions. Target Date: 2024-01-08 Frequency: Daily Modality: individual Progress: 80%  Related Problem: Develop healthy cognitive patterns and beliefs about self and the world that lead to alleviation and help prevent the relapse of mood episodes. Description: Take prescribed medications as directed. Target Date: 2024-01-08 Frequency: Daily Modality: individual Progress: 90%  Related Problem: Develop healthy cognitive patterns and beliefs about self and the world that lead to alleviation and help prevent the relapse of mood episodes. Description: Discuss and resolve troubling  personal and interpersonal issues. Target Date: 2024-01-08 Frequency: Daily Modality: individual Progress: 80% Resolve complicated grief       Target Date: 12-25       Progress: 50%  Client Response full compliance  Service Location Location, [address] [place], [place] [address] -virtual Service Code cpt [phone]  Emotion regulation skills  Related past to present  Rationally challenge thoughts or beliefs/cognitive restructuring  Identify/label emotions  Validate/empathize  Self care activities  Lifestyle change (exercise, nutrition)  Self-monitoring  Facilitate problem solving  Session notes:  f31.9 Bipolar Unspecified.  Goals: Mood stabilization, improved marital relationship, improve self esteem and elimination of self harm thoughts/tendencies. Also, patient struggles with leaving home comfortably and seeks to improve this ability. Target date for these goals is January, 2023. In addition, she needs to improve self care behaviors due to her weight and various medical conditions. Target date for this goal is Feb., 2022. Date revised to December, 2023 to allow time for behavioral implementation. Reports periodic outbursts of rage/anger. Seeks to minimize. Target date is December, 2023. Patient has realized improvement in all areas. Requests additional treatment to facilitate maintenance of progress. Goal date 12-25. Patient's husband died unexpectedly and she is suffering complicated grief. She needs additional counseling to address associated issues and manage feelings of loss, despair and anxiety. Goal date 12-25.  Meds: Vyralar (6mg ),Nuedexta  (40+20mg /day), Adderall (20mg  3X/day), Xanax  (1mg , 3X/day), and Klonopin  (1mg  3X/day).   Patient agreed to a video Public affairs consultant) session and understands limitations of platform. She is at home and provider in his office.   Session note: Haydyn says her weekend was boring and uneventful. Was up for a couple of days and then slept for a  full day. Was up again last night. Says that Veva Alma has not made recommendation about how to manage this situation. She admits to  having trouble completing her sleep diary as she had been tasked. Anniversary of [name]'s death will be 2 years on the 10/11/2023. She has still left some items in place since that time. She meets with her surgeon Oct. 10th to check her drain. She also needs to schedule her pulmonary function test. She feels like people (family) are not really hearing her with regard to what she needs from them. States she feels better when we talk. Emphasized she can call me or text me if she needs to interact.                                                 CONI ALM KERNS, PhD  Time:2:15p-3:00p 45 minutes.                                                                                   CONI ALM KERNS, PhD

## 2023-10-02 ENCOUNTER — Ambulatory Visit: Admitting: Psychology

## 2023-10-03 ENCOUNTER — Ambulatory Visit (INDEPENDENT_AMBULATORY_CARE_PROVIDER_SITE_OTHER): Admitting: Psychology

## 2023-10-03 DIAGNOSIS — F319 Bipolar disorder, unspecified: Secondary | ICD-10-CM

## 2023-10-03 DIAGNOSIS — F5105 Insomnia due to other mental disorder: Secondary | ICD-10-CM | POA: Diagnosis not present

## 2023-10-03 NOTE — Progress Notes (Signed)
 [facility] Counselor/Therapist Progress Note  Patient ID: [name], MRN: [id number],    Date: 10/03/2023  Time Spent: 3:00pm-3:45pm   45 minutes   Treatment Type: Individual Therapy  Reported Symptoms: depression  Mental Status Exam: Appearance:  Casual     Behavior: Appropriate  Motor: Normal  Speech/Language:  Normal Rate  Affect: Appropriate  Mood: depressed  Thought process: normal  Thought content:   WNL  Sensory/Perceptual disturbances:   WNL  Orientation: oriented to person, place, time/date, and situation  Attention: Fair  Concentration: Fair  Memory: WNL  Fund of knowledge:  Fair  Insight:   Fair  Judgment:  Fair  Impulse Control: Fair   Risk Assessment: Danger to Self:  No Self-injurious Behavior: No Danger to Others: No Duty to Warn:no Physical Aggression / Violence:No  Access to Firearms a concern: No  Gang Involvement:No   Subjective: Pt present for face-to-face individual therapy via video.  Pt consents to telehealth video session and is aware of limitations and benefits of virtual sessions.   Location of pt: home Location of therapist: home office.   Pt states her sleep has continued to be sporadic.  She states she has been awake for a couple of days and not been able to sleep.  Other times the past few weeks she sleeps during the day.   Pt did not complete the sleep diaries so therapist has been unable to get the data needed to do CBT-I treatment.   Pt also states she needs to sleep whenever she can and doesn't feel she can adhere to try to set a consistent sleep schedule even though her stated goal is to be able to have a consistent sleep schedule.   Talked with pt about the components of CBT-I treatment and that it seems that she is not able to engage in some of the treatment components.    Recommended that pt be referred to a sleep clinic.   Addressed with pt that this therapist will talk with pt's therapist Dr. [name] (who  sees pt weekly) to make a referral plan to address pt's sleep issues.  Pt is in agreement with this plan.     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  Pt participated in setting treatment goals.   Pt wants to improve her sleep and get her nights and days back on track.  Plan to meet with pt every two weeks for CBT-I.   Pt is in agreement with treatment plan.    Treatment Plan Client Abilities/Strengths  Pt is motivated for therapy.  Client Treatment Preferences  individual therapy  Client Statement of Needs  Improve sleep and coping skills.  Symptoms  Complains of difficulty falling asleep. Complains of difficulty remaining asleep. Problems Addressed  Sleep Disturbance Goals 1. Feel refreshed and energetic during wakeful hours. 2. Restore restful sleep pattern. Objective Learn and implement stimulus control strategies to establish a consistent sleep-wake rhythm. Target Date: 2024-07-09  Frequency: Biweekly Progress: 10 Modality: individual Related Interventions 1. Discuss with the client the rationale for stimulus control strategies to establish a consistent sleep-wake cycle (see Behavioral Treatments for Sleep Disorders by Perlis, Aloia, and Juanice). 2. Teach the client stimulus control techniques (e.g., lie down to sleep only when sleepy; do not use the bed for activities like watching television, reading, listening to music, but only for sleep or sexual activity; get out of bed if sleep doesn't  arrive soon after retiring; lie back down when sleepy; set alarm to the same wake-up time every morning regardless of sleep time or quality; do not nap during the day); assign consistent implementation. 3. Instruct the client to move activities associated with arousal and activation from the bedtime ritual to other times during the day (e.g., reading stimulating content, reviewing day's events, planning for next day,  watching disturbing television). 4. Monitor the client's sleep patterns and compliance with stimulus control instructions; problem-solve obstacles and reinforce successful, consistent implementation. Objective Learn and implement a sleep restriction method to increase sleep efficiency. Target Date: 2024-07-09  Frequency: Biweekly Progress: 10 Modality: individual Related Interventions 5. Use a sleep restriction therapy approach in which the amount of time in bed is reduced to match the amount of time the patient typically sleeps (e.g., from 8 hours to 5), thus inducing systematic sleep deprivation; periodically adjust sleep time upward until an optimal sleep duration is reached. Objective Learn and implement calming skills for use at bedtime. Target Date: 2024-07-09  Frequency: Biweekly Progress: 10 Modality: individual Related Interventions 6. Teach the client relaxation skills (e.g., progressive muscle relaxation, guided imagery, slow diaphragmatic breathing); teach the client how to apply these skills to facilitate relaxation and sleep at bedtime (see Bedtime Relaxation Techniques by Otto armin Carbon). 7. Refer the client for or conduct biofeedback training to strengthen the client's successful relaxation response. Objective Practice good sleep hygiene. Target Date: 2024-07-09  Frequency: Biweekly Progress: 10 Modality: individual Related Interventions 8. Instruct the client in sleep hygiene practices such as restricting excessive liquid intake, spicy late night snacks, or heavy evening meals; exercising regularly, but not within 3 - 4 hours of bedtime; minimizing or avoiding caffeine, alcohol, tobacco, and stimulant intake (or assign Sleep Pattern Record in the Adult Psychotherapy Homework Planner by [facility]). Diagnosis Insomnia F51.05 Conditions For Discharge Achievement of treatment goals and objectives   Treatment Plan Client Abilities/Strengths  Pt is motivated for therapy.   Client Treatment Preferences  Individual therapy.  Client Statement of Needs  Improve coping skills.  Symptoms  Depressed or irritable mood. Diminished interest in or enjoyment of activities. Lack of energy. Sleeplessness or hypersomnia. Social withdrawal. Problems Addressed  Bipolar Disorder - Depression  Goals 1. Normalize sleep patterns and address how mood impacts sleep disturbance.  2. Develop healthy thinking patterns and beliefs about self, others, and the world that lead to the alleviation and help prevent the relapse of depression. 3. Normalize energy level and return to usual activities, good judgment, stable mood, more realistic expectations, and goal-directed behavior. . Objective Maintain a pattern of regular rhythm to daily activities. Target Date: 2024-07-09 Frequency: Biweekly  Progress: 10 Modality: individual  Related Interventions Engage the client in a balanced schedule of behavioral activation by scheduling rewarding activities while not over-stimulating; use activity and mood monitoring to facilitate an optimal balance of activity; reinforce success. Teach the client about the importance of good sleep hygiene. assess and intervene accordingly. Assist the client in establishing a more routine pattern of daily activities such as sleeping, eating, solitary and social activities, and exercise; use and review a form to schedule, assess, and modify these activities so that they occur in a predictable rhythm every day. Conduct Interpersonal and Social Rhythm Therapy beginning with the assessment of the client's daily activities using an interview and the Social Rhythm Metric. Objective Identify and replace thoughts and behaviors that trigger depressive symptoms. Target Date: 2024-07-09 Frequency: Biweekly  Progress: 10 Modality: individual  Related Interventions Teach  the client cognitive-behavioral coping and relapse prevention skills including delaying impulsive  actions, structured scheduling of daily activities, keeping a regular sleep routine, avoiding unrealistic goal striving, using relaxation procedures, identifying and avoiding episode triggers such as stimulant drug use, alcohol consumption, breaking sleep routine, or exposing self to high stress. Assign the client a homework exercise in which he/she identifies self-talk reflective of mania, biases in the self-talk, alternatives. review and reinforce success, providing corrective feedback toward improvement. Use cognitive therapy techniques to explore and educate the client about cognitive biases that trigger his/her elevated or depressive mood. Diagnosis F31.9 Conditions For Discharge Achievement of treatment goals and objectives   Veva Alma, LCSW

## 2023-10-09 ENCOUNTER — Ambulatory Visit: Admitting: Psychology

## 2023-10-10 DIAGNOSIS — G4733 Obstructive sleep apnea (adult) (pediatric): Secondary | ICD-10-CM | POA: Diagnosis not present

## 2023-10-15 ENCOUNTER — Ambulatory Visit: Admitting: Psychology

## 2023-10-16 ENCOUNTER — Ambulatory Visit (INDEPENDENT_AMBULATORY_CARE_PROVIDER_SITE_OTHER): Admitting: Psychology

## 2023-10-16 DIAGNOSIS — F319 Bipolar disorder, unspecified: Secondary | ICD-10-CM

## 2023-10-16 DIAGNOSIS — F4321 Adjustment disorder with depressed mood: Secondary | ICD-10-CM
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
## 2023-10-22 ENCOUNTER — Ambulatory Visit: Admitting: Psychology

## 2023-10-25 ENCOUNTER — Ambulatory Visit: Admitting: Psychology

## 2023-10-29 ENCOUNTER — Ambulatory Visit: Admitting: Psychology

## 2023-10-30 ENCOUNTER — Ambulatory Visit: Admitting: Psychology

## 2023-10-30 DIAGNOSIS — F319 Bipolar disorder, unspecified: Secondary | ICD-10-CM | POA: Diagnosis not present

## 2023-10-30 DIAGNOSIS — F4381 Prolonged grief disorder: Secondary | ICD-10-CM

## 2023-10-30 NOTE — Progress Notes (Signed)
 [name] is a 53 y.o. female patient   Treatment Plan: Date: 10/30/2023  Diagnosis 296.80 (Unspecified bipolar or related disorder) [n/a]  Symptoms Depressed or irritable mood. (Status: maintained) -- No Description Entered  Diminished interest in or enjoyment of activities. (Status: maintained) -- No Description Entered  Lack of energy. (Status: maintained) -- No Description Entered  Low self-esteem. (Status: maintained) -- No Description Entered  Medication Status compliance  Safety none  If Suicidal or Homicidal State Action Taken: unspecified  Current Risk: low Medications Adderall (Dosage: 20mg  4X/day)  Klonapin (Dosage: 1mg  3X/day)  Lamictal  (Dosage: 200mg  2x/day)  Nuedexta  (Dosage: 20mg )  Vyralar (Dosage: 6mg )  Xanax  (Dosage: 1mg  3X/day)  Objectives Related Problem: Develop healthy cognitive patterns and beliefs about self and the world that lead to alleviation and help prevent the relapse of mood episodes. Description: State no longer having thoughts of self-harm. Target Date: 2024-01-08 Frequency: Daily Modality: individual Progress: 80%  Related Problem: Develop healthy cognitive patterns and beliefs about self and the world that lead to alleviation and help prevent the relapse of mood episodes. Description: Verbalize any history of past and present suicidal thoughts and actions. Target Date: 2024-01-08 Frequency: Daily Modality: individual Progress: 80%  Related Problem: Develop healthy cognitive patterns and beliefs about self and the world that lead to alleviation and help prevent the relapse of mood episodes. Description: Take prescribed medications as directed. Target Date: 2024-01-08 Frequency: Daily Modality: individual Progress: 90%  Related Problem: Develop healthy cognitive patterns and beliefs about self and the world that lead to alleviation and help prevent the relapse of  mood episodes. Description: Discuss and resolve troubling personal and interpersonal issues. Target Date: 2024-01-08 Frequency: Daily Modality: individual Progress: 80% Resolve complicated grief       Target Date: 12-25       Progress: 50%  Client Response full compliance  Service Location Location, [address] [place], [place] [address] -virtual Service Code cpt [phone]  Emotion regulation skills  Related past to present  Rationally challenge thoughts or beliefs/cognitive restructuring  Identify/label emotions  Validate/empathize  Self care activities  Lifestyle change (exercise, nutrition)  Self-monitoring  Facilitate problem solving  Session notes:  f31.9 Bipolar Unspecified.  Goals: Mood stabilization, improved marital relationship, improve self esteem and elimination of self harm thoughts/tendencies. Also, patient struggles with leaving home comfortably and seeks to improve this ability. Target date for these goals is January, 2023. In addition, she needs to improve self care behaviors due to her weight and various medical conditions. Target date for this goal is Feb., 2022. Date revised to December, 2023 to allow time for behavioral implementation. Reports periodic outbursts of rage/anger. Seeks to minimize. Target date is December, 2023. Patient has realized improvement in all areas. Requests additional treatment to facilitate maintenance of progress. Goal date 12-25. Patient's husband died unexpectedly and she is suffering complicated grief. She needs additional counseling to address associated issues and manage feelings of loss, despair and anxiety. Goal date 12-25.  Meds: Vyralar (6mg ),Nuedexta  (40+20mg /day), Adderall (20mg  3X/day), Xanax  (1mg , 3X/day), and Klonopin  (1mg  3X/day).   Patient agrees to be seen in provider's office.     Session note: [name] says that she got [name]'s 401K which she is rolling into an inherited IRA. She has an Nurse, learning disability helping  her make decisions. She  has an appointment with the surgeon next week to repair or remove drain. Lear is talking more future oriented and appears in less despair. She has been more social as well, going out with and visiting friends. Positive progress.                                                        CONI ALM KERNS, PhD  Time:3:15p-4:00p 45 minutes.

## 2023-11-05 ENCOUNTER — Ambulatory Visit: Admitting: Psychology

## 2023-11-06 ENCOUNTER — Ambulatory Visit: Admitting: Psychology

## 2023-11-06 DIAGNOSIS — F3189 Other bipolar disorder: Secondary | ICD-10-CM | POA: Diagnosis not present

## 2023-11-06 DIAGNOSIS — F4381 Prolonged grief disorder: Secondary | ICD-10-CM
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
## 2023-11-12 ENCOUNTER — Ambulatory Visit: Admitting: Psychology

## 2023-11-13 ENCOUNTER — Ambulatory Visit: Admitting: Psychology

## 2023-11-13 DIAGNOSIS — F3189 Other bipolar disorder: Secondary | ICD-10-CM

## 2023-11-13 DIAGNOSIS — F4381 Prolonged grief disorder: Secondary | ICD-10-CM

## 2023-11-13 NOTE — Progress Notes (Signed)
 [name] is a 53 y.o. female patient   Treatment Plan: Date: 11/13/2023  Diagnosis 296.80 (Unspecified bipolar or related disorder) [n/a]  Symptoms Depressed or irritable mood. (Status: maintained) -- No Description Entered  Diminished interest in or enjoyment of activities. (Status: maintained) -- No Description Entered  Lack of energy. (Status: maintained) -- No Description Entered  Low self-esteem. (Status: maintained) -- No Description Entered  Medication Status compliance  Safety none  If Suicidal or Homicidal State Action Taken: unspecified  Current Risk: low Medications Adderall (Dosage: 20mg  4X/day)  Klonapin (Dosage: 1mg  3X/day)  Lamictal  (Dosage: 200mg  2x/day)  Nuedexta  (Dosage: 20mg )  Vyralar (Dosage: 6mg )  Xanax  (Dosage: 1mg  3X/day)  Objectives Related Problem: Develop healthy cognitive patterns and beliefs about self and the world that lead to alleviation and help prevent the relapse of mood episodes. Description: State no longer having thoughts of self-harm. Target Date: 2024-01-08 Frequency: Daily Modality: individual Progress: 80%  Related Problem: Develop healthy cognitive patterns and beliefs about self and the world that lead to alleviation and help prevent the relapse of mood episodes. Description: Verbalize any history of past and present suicidal thoughts and actions. Target Date: 2024-01-08 Frequency: Daily Modality: individual Progress: 80%  Related Problem: Develop healthy cognitive patterns and beliefs about self and the world that lead to alleviation and help prevent the relapse of mood episodes. Description: Take prescribed medications as directed. Target Date: 2024-01-08 Frequency: Daily Modality: individual Progress: 90%  Related Problem: Develop healthy cognitive patterns and beliefs about self and the world  that lead to alleviation and help prevent the relapse of mood episodes. Description: Discuss and resolve troubling personal and interpersonal issues. Target Date: 2024-01-08 Frequency: Daily Modality: individual Progress: 80% Resolve complicated grief       Target Date: 12-25       Progress: 50%  Client Response full compliance  Service Location Location, [address] [place], [place] [address] -virtual Service Code cpt [id number]  Emotion regulation skills  Related past to present  Rationally challenge thoughts or beliefs/cognitive restructuring  Identify/label emotions  Validate/empathize  Self care activities  Lifestyle change (exercise, nutrition)  Self-monitoring  Facilitate problem solving  Session notes:  f31.9 Bipolar Unspecified.  Goals: Mood stabilization, improved marital relationship, improve self esteem and elimination of self harm thoughts/tendencies. Also, patient struggles with leaving home comfortably and seeks to improve this ability. Target date for these goals is January, 2023. In addition, she needs to improve self care behaviors due to her weight and various medical conditions. Target date for this goal is Feb., 2022. Date revised to December, 2023 to allow time for behavioral implementation. Reports periodic outbursts of rage/anger. Seeks to minimize. Target date is December, 2023. Patient has realized improvement in all areas. Requests additional treatment to facilitate maintenance of progress. Goal date 12-25. Patient's husband died unexpectedly and she is suffering complicated grief. She needs additional counseling to address associated issues and manage feelings of loss, despair and anxiety. Goal date 12-25.  Meds: Vyralar (6mg ),Nuedexta  (40+20mg /day), Adderall (20mg  3X/day), Xanax  (1mg , 3X/day), and Klonopin  (1mg  3X/day).   Patient agrees to be seen in provider's office.  Session note: Ece says she was sleeping a lot and now is having trouble  getting back to sleep. She has been sick with an infection (sinus) and was not able to go to her surgeon's appointment on Friday. Rescheduled for October 28th. Also had to reschedule her dentist's appointment. Her birthday is next Wednesday, and she has no plans. She went out with friend [place] yesterday morning and then got together again in the evening. She claims it was a nice time. Reports that her moods have been a little more stable in recent weeks.                                                                    CONI ALM KERNS, PhD  Time:3:15p-4:00p 45 minutes.

## 2023-11-14 ENCOUNTER — Ambulatory Visit: Admitting: Internal Medicine

## 2023-11-15 ENCOUNTER — Telehealth (INDEPENDENT_AMBULATORY_CARE_PROVIDER_SITE_OTHER): Admitting: Family Medicine

## 2023-11-15 VITALS — Temp 98.4°F

## 2023-11-15 DIAGNOSIS — J4520 Mild intermittent asthma, uncomplicated: Secondary | ICD-10-CM | POA: Diagnosis not present

## 2023-11-15 DIAGNOSIS — F319 Bipolar disorder, unspecified: Secondary | ICD-10-CM

## 2023-11-15 DIAGNOSIS — E1169 Type 2 diabetes mellitus with other specified complication: Secondary | ICD-10-CM

## 2023-11-15 DIAGNOSIS — J988 Other specified respiratory disorders: Secondary | ICD-10-CM

## 2023-11-15 DIAGNOSIS — I272 Pulmonary hypertension, unspecified: Secondary | ICD-10-CM | POA: Diagnosis not present

## 2023-11-15 NOTE — Assessment & Plan Note (Signed)
 She is not having symptoms that suggest an acute exacerbation. She inquires about trial of prednisone , I do not think it is needed at this time but prescription sent in case she started noticing wheezing. Recommend using albuterol  2 puff every 6 hours for 1 week and then as needed. Follows with pulmonologist.

## 2023-11-15 NOTE — Assessment & Plan Note (Addendum)
 Last hemoglobin A1c 7.3 in 04/2022. She is not monitoring BS regularly, instructed to doing so. I added a hemoglobin A1c to be done next time she has blood work or she can come to the office for blood work. Continue periodic eye exams and appropriate footcare.

## 2023-11-15 NOTE — Assessment & Plan Note (Signed)
Follows with psychiatrist regularly.

## 2023-11-15 NOTE — Progress Notes (Signed)
 Virtual Visit via Video Note I connected with [name] on 11/15/23 by a video enabled telemedicine application and verified that I am speaking with the correct person using two identifiers. Location patient: home Location provider:work office Persons participating in the virtual visit: patient, provider  I discussed the limitations of evaluation and management by telemedicine and the availability of in person appointments. The patient expressed understanding and agreed to proceed.  Chief Complaint  Patient presents with   Acute Visit    Cough and congestion since last Wednesday    History of Present Illness [name] is a 53 y.o. female with past medical history significant for type 2 diabetes diet-controlled, hypertension, OSA on CPAP, bipolar disorder, and HFrEF seeing on video today because respiratory symptoms as described above. Discussed the use of AI scribe software for clinical note transcription with the patient, who gave verbal consent to proceed.  She has been experiencing cough and nasal congestion for approximately ten days. The congestion is primarily sinus and nasal, accompanied by a productive cough.  No hemoptysis, dyspnea, wheezing, chest pain, fever, or chills.  She has had sore throat.  There has been no contact with sick individuals or recent travel.  She notes slight improvement over the past few days but feels additional treatment is needed for full recovery.  She has been using Mucinex, Tylenol , and Flonase  for symptom relief.  For asthma, she uses an albuterol  inhaler as needed and denies any new wheezing or increased difficulty breathing beyond her usual asthma symptoms. She inquires about prednisone  treatment. Concerned about productive cough with yellowish sputum.  Negative for associated abdominal pain, nausea, vomiting, or urinary symptoms.  She was last seen on 05/23/22. Since her last visit she has been following regularly with  psychiatrist for bipolar disorder and depression. Follows with cardiologist for CHF and pulmonary hypertension.. Asthma, OSA on CPAP: Follows with pulmonologist.  Listed on past medical history is polycythemia vera, she is not aware of this problem. She has not seen hematologist in the past. Lab Results  Component Value Date   WBC 4.6 05/23/2022   HGB 13.6 05/23/2022   HCT 41.4 05/23/2022   MCV 89.0 05/23/2022   PLT 251.0 05/23/2022      Latest Ref Rng & Units 05/23/2022    2:41 PM 05/15/2022    4:55 AM 05/14/2022    3:50 AM  CBC  WBC 4.0 - 10.5 K/uL 4.6  4.9  4.6   Hemoglobin 12.0 - 15.0 g/dL 86.3  85.1  85.6   Hematocrit 36.0 - 46.0 % 41.4  45.9  44.2   Platelets 150.0 - 400.0 K/uL 251.0  409  442    DM2: Currently she is on nonpharmacologic treatment. She is not monitoring BS regularly. She reports that she has lost about 100 pounds since her last visit.  Lab Results  Component Value Date   HGBA1C 7.3 (H) 05/12/2022   Lab Results  Component Value Date   NA 138 05/23/2022   CL 104 05/23/2022   K 4.8 hemolyzed sample 05/23/2022   CO2 25 05/23/2022   BUN 11 05/23/2022   CREATININE 0.87 05/23/2022   GFR 76.56 05/23/2022   CALCIUM  9.1 05/23/2022   PHOS 4.2 01/02/2022   ALBUMIN  2.2 (L) 05/12/2022   GLUCOSE 157 (H) 05/23/2022   ROS: See pertinent positives and negatives per HPI.  Past Medical History:  Diagnosis Date   Acute systolic congestive heart failure (HCC)    Agoraphobia    Anxiety  panic attacks   Arthritis    osteoarthritis bilal. knees   Asthma    no per PFT 7/13; reports does not have asthma   Bipolar disorder (HCC)    Carpal tunnel syndrome of left wrist 05/2011   Being evaluated for MS   Cor pulmonale (HCC)    Depression    Depression    History of pleural effusion    Hyperlipidemia    Hypertension    Hypoxemia    history of - no home O2   IBS (irritable bowel syndrome)    Morbid obesity (HCC)    Obesity hypoventilation syndrome (HCC)     Perforation of sigmoid colon due to diverticulitis 12/13/2021   Pneumonia    Pre-diabetes    Prediabetes    Seasonal allergies    current runny nose   Seizures (HCC)    febrile seizure x 1 as a child. Several times   Sleep apnea sleep study 07/02/2010   uses CPAP nightly   Spinal stenosis in cervical region    Urinary incontinence     Past Surgical History:  Procedure Laterality Date   APPLICATION OF INTRAOPERATIVE CT SCAN N/A 05/14/2019   Procedure: APPLICATION OF INTRAOPERATIVE CT SCAN;  Surgeon: Unice Pac, MD;  Location: [facility] OR;  Service: Neurosurgery;  Laterality: N/A;   CARPAL TUNNEL RELEASE  06/27/2011   Procedure: CARPAL TUNNEL RELEASE;  Surgeon: Arley JONELLE Curia, MD;  Location: [facility];  Service: Orthopedics;  Laterality: Left;   CARPAL TUNNEL RELEASE  12/19/2011   Procedure: CARPAL TUNNEL RELEASE;  Surgeon: Arley JONELLE Curia, MD;  Location: [facility];  Service: Orthopedics;  Laterality: Right;   INCISION AND DRAINAGE ABSCESS Left 08/27/2017   Procedure: INCISION AND DRAINAGE LEFT BUTTOCK  ABSCESS;  Surgeon: Mikell Katz, MD;  Location: [facility];  Service: General;  Laterality: Left;   IR RADIOLOGIST EVAL & MGMT  03/16/2022   IR RADIOLOGIST EVAL & MGMT  04/18/2022   IR SINUS/FIST TUBE CHK-NON GI  01/03/2022   IR SINUS/FIST TUBE CHK-NON GI  01/03/2022   IR SINUS/FIST TUBE CHK-NON GI  01/03/2022   IR SINUS/FIST TUBE CHK-NON GI  01/03/2022   IRRIGATION AND DEBRIDEMENT BUTTOCKS Left 08/30/2017   Procedure: IRRIGATION AND DEBRIDEMENT RE-EXCISION OF LEFT SUBCUTANEOUS BUTTOCKS ABCESS;  Surgeon: Stevie Herlene Righter, MD;  Location: [facility];  Service: General;  Laterality: Left;   LAPAROSCOPY N/A 12/11/2021   Procedure: LAPAROSCOPIC PERITONEAL LAVAGE WITH DRAIN PLACEMENT;  Surgeon: Lyndel Deward PARAS, MD;  Location: [facility] OR;  Service: General;  Laterality: N/A;   MASS EXCISION  08/07/2010   right index   POSTERIOR CERVICAL FUSION/FORAMINOTOMY  09/11/2011    Procedure: POSTERIOR CERVICAL FUSION/FORAMINOTOMY LEVEL 1;  Surgeon: Pac Unice, MD;  Location: [facility];  Service: Neurosurgery;  Laterality: N/A;  Cervical Three-Four Posteior Cervical Fusion and Decompression.   POSTERIOR CERVICAL FUSION/FORAMINOTOMY N/A 05/14/2019   Procedure: Posterior cervical decompression/fusion Cervical four to Thoracic one with exploration/revision of Cervical threee-four fusion;  Surgeon: Unice Pac, MD;  Location: [facility] OR;  Service: Neurosurgery;  Laterality: N/A;   TRIGGER FINGER RELEASE  12/19/2011   Procedure: RELEASE TRIGGER FINGER/A-1 PULLEY;  Surgeon: Arley JONELLE Curia, MD;  Location: [facility];  Service: Orthopedics;  Laterality: Right;   TRIGGER FINGER RELEASE Left 12/31/2012   Procedure: RELEASE A-1 PULLEY LEFT THUMB;  Surgeon: Arley JONELLE Curia, MD;  Location: [facility];  Service: Orthopedics;  Laterality: Left;   WISDOM TOOTH EXTRACTION  Family History  Problem Relation Age of Onset   Asthma Mother    Allergic rhinitis Mother    Multiple sclerosis Mother    Asthma Maternal Grandmother    Breast cancer Neg Hx     Social History   Socioeconomic History   Marital status: Widowed    Spouse name: Not on file   Number of children: 0   Years of education: Not on file   Highest education level: Bachelor's degree (e.g., BA, AB, BS)  Occupational History   Occupation: currently unemployed  Tobacco Use   Smoking status: Never   Smokeless tobacco: Never  Vaping Use   Vaping status: Never Used  Substance and Sexual Activity   Alcohol use: Yes    Comment: rare   Drug use: No   Sexual activity: Not Currently  Other Topics Concern   Not on file  Social History Narrative   Lives in [place]         Social Drivers of Health   Financial Resource Strain: Medium Risk (11/15/2023)   Overall Financial Resource Strain (CARDIA)    Difficulty of Paying Living Expenses: Somewhat hard  Food Insecurity: Patient Declined  (11/15/2023)   Hunger Vital Sign    Worried About Running Out of Food in the Last Year: Patient declined    Ran Out of Food in the Last Year: Patient declined  Transportation Needs: No Transportation Needs (11/15/2023)   PRAPARE - Administrator, Civil Service (Medical): No    Lack of Transportation (Non-Medical): No  Physical Activity: Inactive (11/15/2023)   Exercise Vital Sign    Days of Exercise per Week: 0 days    Minutes of Exercise per Session: Not on file  Stress: Stress Concern Present (11/15/2023)   [employer] of Occupational Health - Occupational Stress Questionnaire    Feeling of Stress: Very much  Social Connections: Socially Isolated (11/15/2023)   Social Connection and Isolation Panel    Frequency of Communication with Friends and Family: More than three times a week    Frequency of Social Gatherings with Friends and Family: Twice a week    Attends Religious Services: Patient declined    Database administrator or Organizations: No    Attends Engineer, structural: Not on file    Marital Status: Widowed  Intimate Partner Violence: Not At Risk (04/15/2023)   Humiliation, Afraid, Rape, and Kick questionnaire    Fear of Current or Ex-Partner: No    Emotionally Abused: No    Physically Abused: No    Sexually Abused: No   Current Outpatient Medications:    acetaminophen  (TYLENOL ) 325 MG tablet, Take 2 tablets (650 mg total) by mouth every 6 (six) hours as needed for mild pain (or Fever >/= 101)., Disp: , Rfl:    ALPRAZolam  (XANAX  XR) 1 MG 24 hr tablet, Take 1 mg by mouth in the morning and at bedtime. Taking twice daily along with 1mg  tablet, Disp: , Rfl:    amphetamine -dextroamphetamine  (ADDERALL) 20 MG tablet, Take 20 mg by mouth in the morning, at noon, in the evening, and at bedtime. , Disp: , Rfl:    Ascorbic Acid  (VITAMIN C ) 500 MG tablet, Take 1,000 mg by mouth daily., Disp: , Rfl:    doxycycline  (VIBRA -TABS) 100 MG tablet, Take 1 tablet  (100 mg total) by mouth 2 (two) times daily for 7 days., Disp: 14 tablet, Rfl: 0   fluconazole  (DIFLUCAN ) 150 MG tablet, Take 1 tablet (150 mg total) by  mouth daily., Disp: 1 tablet, Rfl: 0   fluticasone  (FLONASE ) 50 MCG/ACT nasal spray, Place 2 sprays into both nostrils daily for 7 days., Disp: 16 g, Rfl: 0   lamoTRIgine  (LAMICTAL ) 200 MG tablet, Take 200 mg by mouth daily., Disp: , Rfl:    lidocaine  (LIDODERM ) 5 %, PLACE 1 PATCH ONTO THE SKIN DAILY AS NEEDED FOR KNEE PAIN. REMOVE AND DISCARD PATCH WITHIN 12 HOURS OR AS DIRECTED BY DOCTOR., Disp: 30 patch, Rfl: 0   losartan  (COZAAR ) 25 MG tablet, TAKE 1 TABLET TWICE DAILY, Disp: 180 tablet, Rfl: 3   Lurasidone  HCl 120 MG TABS, Take 120 mg by mouth daily in the afternoon., Disp: , Rfl:    LYRICA  300 MG capsule, Take 300 mg by mouth 2 (two) times daily. , Disp: , Rfl:    Multiple Vitamin (MULTIVITAMIN) tablet, Take 1 tablet by mouth daily., Disp: , Rfl:    NUEDEXTA  20-10 MG CAPS, Take 1 capsule by mouth every 12 (twelve) hours. , Disp: , Rfl: 12   predniSONE  (DELTASONE ) 20 MG tablet, Take 2 tablets (40 mg total) by mouth daily with breakfast for 3 days., Disp: 6 tablet, Rfl: 0   Sodium Chloride  Flush (NORMAL SALINE FLUSH) 0.9 % SOLN, Use to flush drain 1-2 times daily., Disp: 600 mL, Rfl: 1   traZODone (DESYREL) 100 MG tablet, Take 200 mg by mouth at bedtime., Disp: , Rfl:    zaleplon (SONATA) 10 MG capsule, Take 20 mg by mouth at bedtime., Disp: , Rfl:    albuterol  (VENTOLIN  HFA) 108 (90 Base) MCG/ACT inhaler, Inhale 2 puffs into the lungs every 6 (six) hours as needed for wheezing or shortness of breath. (Patient not taking: Reported on 11/15/2023), Disp: 20.1 g, Rfl: 0   ALPRAZolam  (XANAX ) 1 MG tablet, Take 1 mg by mouth 2 (two) times daily. (Patient not taking: Reported on 11/15/2023), Disp: , Rfl:    aspirin  81 MG tablet, Take 1 tablet (81 mg total) by mouth daily. (Patient not taking: Reported on 11/15/2023), Disp: 30 tablet, Rfl:     atorvastatin  (LIPITOR) 20 MG tablet, TAKE 1 TABLET BY MOUTH EVERY DAY IN THE EVENING (Patient not taking: Reported on 11/15/2023), Disp: 90 tablet, Rfl: 4   b complex vitamins tablet, Take 1 tablet by mouth daily. (Patient not taking: Reported on 11/15/2023), Disp: , Rfl:    Blood Glucose Monitoring Suppl (ACCU-CHEK AVIVA PLUS) w/Device KIT, As directed. (Patient not taking: Reported on 11/15/2023), Disp: 1 kit, Rfl: 0   Calcium  Citrate (CITRACAL PO), Take 2 tablets by mouth daily. (Patient not taking: Reported on 11/15/2023), Disp: , Rfl:    Cholecalciferol  (VITAMIN D3) 5000 UNITS TABS, Take 10,000 Units by mouth in the morning and at bedtime. (Patient not taking: Reported on 11/15/2023), Disp: , Rfl:    Chromium  Picolinate 500 MCG TABS, Take 500 mcg by mouth daily.  (Patient not taking: Reported on 11/15/2023), Disp: , Rfl:    diclofenac  Sodium (VOLTAREN ) 1 % GEL, Apply 2 g topically 3 (three) times daily as needed (knee pain). (Patient not taking: Reported on 11/15/2023), Disp: , Rfl:    Fluticasone -Umeclidin-Vilant (TRELEGY ELLIPTA ) 100-62.5-25 MCG/ACT AEPB, Inhale 1 puff into the lungs daily. (Patient not taking: Reported on 11/15/2023), Disp: , Rfl:    furosemide  (LASIX ) 40 MG tablet, Take 1 tablet (40 mg total) by mouth 2 (two) times daily. (Patient not taking: Reported on 09/19/2023), Disp: 30 tablet, Rfl: 0   levalbuterol  (XOPENEX  HFA) 45 MCG/ACT inhaler, Inhale 2 puffs into the lungs  every 6 (six) hours as needed for wheezing. (Patient not taking: Reported on 11/15/2023), Disp: 45 g, Rfl: 3   loratadine  (CLARITIN ) 10 MG tablet, Take 1 tablet (10 mg total) by mouth daily for 10 days. (Patient not taking: Reported on 09/19/2023), Disp: 10 tablet, Rfl: 0   MAGNESIUM  PO, Take 500 mg by mouth daily.  (Patient not taking: Reported on 11/15/2023), Disp: , Rfl:    Omega-3 Fatty Acids (FISH OIL) 1200 MG CAPS, Take 2,400 mg by mouth daily.  (Patient not taking: Reported on 09/19/2023), Disp: , Rfl:     potassium chloride  SA (KLOR-CON  M20) 20 MEQ tablet, Take 1 tablet (20 mEq total) by mouth daily. (Patient not taking: Reported on 11/15/2023), Disp: 90 tablet, Rfl: 4   VRAYLAR  6 MG CAPS, Take 6 mg by mouth every evening.  (Patient not taking: Reported on 09/19/2023), Disp: , Rfl:    Zinc  50 MG CAPS, Take 50 mg by mouth in the morning and at bedtime. (Patient not taking: Reported on 11/15/2023), Disp: , Rfl:   Current Facility-Administered Medications:    albuterol  (PROVENTIL ) (2.5 MG/3ML) 0.083% nebulizer solution 2.5 mg, 2.5 mg, Nebulization, Once, Parrett, Tammy S, NP  EXAM:  VITALS per patient if applicable:Temp 98.4 F (36.9 C)   GENERAL: alert, oriented, appears well and in no acute distress  HEENT: atraumatic, conjunctiva clear, no obvious abnormalities on inspection of external nose and ears  NECK: normal movements of the head and neck  LUNGS: on inspection no signs of respiratory distress, breathing rate appears normal, no obvious gross SOB, gasping or wheezing while walking around the house during visit. Productive cough noted once during visit.  CV: no obvious cyanosis  MS: moves all visible extremities without noticeable abnormality  PSYCH/NEURO: pleasant and cooperative, no obvious depression or anxiety, speech and thought processing grossly intact  ASSESSMENT AND PLAN:  Discussed the following assessment and plan:  Mild intermittent asthma without complication Assessment & Plan: She is not having symptoms that suggest an acute exacerbation. She inquires about trial of prednisone , I do not think it is needed at this time but prescription sent in case she started noticing wheezing.  We discussed son side effects.  Recommend using albuterol  2 puff every 6 hours for 1 week and then as needed. Follows with pulmonologist.  Orders: -     predniSONE ; Take 2 tablets (40 mg total) by mouth daily with breakfast for 3 days.  Dispense: 6 tablet; Refill: 0  Respiratory tract  infection Explained that symptoms are not suggestive of a serious respiratory process, most likely viral.  She reports that symptoms are gradually improving. Because she has risk for complications like pneumonia, recommend trial of antibiotic, doxycycline  100 mg twice daily for 7 days; explained that it will not help in case of viral illnesses. Plain Mucinex to continue as well as adequate hydration. Monitor for new symptoms. Explained that congestion and cough can last weeks after acute symptoms have resolved. Instructed about warning signs.  -     Doxycycline  Hyclate; Take 1 tablet (100 mg total) by mouth 2 (two) times daily for 7 days.  Dispense: 14 tablet; Refill: 0  Bipolar 1 disorder (HCC) Assessment & Plan: Follows with psychiatrist regularly.  Type 2 diabetes mellitus with other specified complication, without long-term current use of insulin  [facility]) Assessment & Plan: Last hemoglobin A1c 7.3 in 04/2022. She is not monitoring BS regularly, instructed to doing so. I added a hemoglobin A1c to be done next time she has blood work  or she can come to the office for blood work. Continue periodic eye exams and appropriate footcare.  Orders: -     Microalbumin / creatinine urine ratio; Future -     Hemoglobin A1c; Future  Pulmonary hypertension (HCC) Assessment & Plan: Following with pulmonology regularly.  We discussed possible serious and likely etiologies, options for evaluation and workup, limitations of telemedicine visit vs in person visit, treatment, treatment risks and precautions. The patient was advised to call back or seek an in-person evaluation if the symptoms worsen or if the condition fails to improve as anticipated. I discussed the assessment and treatment plan with the patient. The patient was provided an opportunity to ask questions and all were answered. The patient agreed with the plan and demonstrated an understanding of the instructions.  Return if symptoms worsen  or fail to improve, for keep next appointment.  Timo Hartwig [place], MD

## 2023-11-15 NOTE — Assessment & Plan Note (Signed)
 Following with pulmonology regularly.

## 2023-11-18 ENCOUNTER — Ambulatory Visit: Payer: Self-pay

## 2023-11-18 NOTE — Telephone Encounter (Signed)
 Follow up with pt and inform her provider's message below. She states she will monitor her sx until Wednesday. If not feeling better, will contact our office to be seen. No further action is needed at this time.

## 2023-11-18 NOTE — Telephone Encounter (Addendum)
 Virtual visit on 11/15/23, when abx was recommended for persistent respiratory symptoms. If she is concerned about strep infection in office visit can be arranged.Otherwise she can try OTC throat lozenges and monitor for fever. Thanks, BJ

## 2023-11-18 NOTE — Telephone Encounter (Signed)
 FYI Only or Action Required?: Action required by provider: update on patient condition.  Patient was last seen in primary care on 11/15/2023 by [place], Jessica G, MD.  Called Nurse Triage reporting Sore Throat.  Symptoms began a week ago.  Interventions attempted: Prescription medications:  SABRA  Symptoms are: gradually improving. States tonsil is swollen, sore. What else should she do. Declines OV.  Triage Disposition: See PCP When Office is Open (Within 3 Days)  Patient/caregiver understands and will follow disposition?: No, wishes to speak with PCP      Copied from CRM #[id number]. Topic: Clinical - Red Word Triage >> Nov 18, 2023  8:10 AM Suzen RAMAN wrote: Red Word that prompted transfer to Nurse Triage: swollen right tonsil after taken doxycycline  (VIBRA -TABS) 100 MG tablet. Reason for Disposition  [1] Sore throat is the only symptom AND [2] present > 48 hours  Answer Assessment - Initial Assessment Questions 1. ONSET: When did the throat start hurting? (Hours or days ago)      Last week 2. SEVERITY: How bad is the sore throat? (Scale 1-10; mild, moderate or severe)     4 3. STREP EXPOSURE: Has there been any exposure to strep within the past week? If Yes, ask: What type of contact occurred?      no 4.  VIRAL SYMPTOMS: Are there any symptoms of a cold, such as a runny nose, cough, hoarse voice or red eyes?      Runny nose 5. FEVER: Do you have a fever? If Yes, ask: What is your temperature, how was it measured, and when did it start?     no 6. PUS ON THE TONSILS: Is there pus on the tonsils in the back of your throat?     no 7. OTHER SYMPTOMS: Do you have any other symptoms? (e.Velasquez., difficulty breathing, headache, rash)     Tonsil swollen 8. PREGNANCY: Is there any chance you are pregnant? When was your last menstrual period?     no  Protocols used: Sore Throat-A-AH

## 2023-11-19 ENCOUNTER — Ambulatory Visit: Admitting: Psychology

## 2023-11-19 NOTE — Telephone Encounter (Signed)
 Spoke with patient and gave Dr. Gib recommendations. Patient states she is not concerned at this time about strep. She has just noticed some swelling in her tonsils. Patient states she is progressively feeling better. No fevers. Patient has been using Hall's cough drops. Advised patient to complete the course of antibiotics. If symptoms do not improve, to arrange an appointment with PCP. Patient voiced understanding.

## 2023-11-26 ENCOUNTER — Ambulatory Visit: Admitting: Psychology

## 2023-11-26 DIAGNOSIS — K5720 Diverticulitis of large intestine with perforation and abscess without bleeding: Secondary | ICD-10-CM | POA: Diagnosis not present

## 2023-11-27 ENCOUNTER — Ambulatory Visit: Admitting: Psychology

## 2023-11-27 DIAGNOSIS — F4381 Prolonged grief disorder: Secondary | ICD-10-CM

## 2023-11-27 DIAGNOSIS — F3189 Other bipolar disorder: Secondary | ICD-10-CM

## 2023-11-27 NOTE — Progress Notes (Signed)
 [name] is a 53 y.o. female patient   Treatment Plan: Date: 11/27/2023  Diagnosis 296.80 (Unspecified bipolar or related disorder) [n/a]  Symptoms Depressed or irritable mood. (Status: maintained) -- No Description Entered  Diminished interest in or enjoyment of activities. (Status: maintained) -- No Description Entered  Lack of energy. (Status: maintained) -- No Description Entered  Low self-esteem. (Status: maintained) -- No Description Entered  Medication Status compliance  Safety none  If Suicidal or Homicidal State Action Taken: unspecified  Current Risk: low Medications Adderall (Dosage: 20mg  4X/day)  Klonapin (Dosage: 1mg  3X/day)  Lamictal  (Dosage: 200mg  2x/day)  Nuedexta  (Dosage: 20mg )  Vyralar (Dosage: 6mg )  Xanax  (Dosage: 1mg  3X/day)  Objectives Related Problem: Develop healthy cognitive patterns and beliefs about self and the world that lead to alleviation and help prevent the relapse of mood episodes. Description: State no longer having thoughts of self-harm. Target Date: 2024-01-08 Frequency: Daily Modality: individual Progress: 80%  Related Problem: Develop healthy cognitive patterns and beliefs about self and the world that lead to alleviation and help prevent the relapse of mood episodes. Description: Verbalize any history of past and present suicidal thoughts and actions. Target Date: 2024-01-08 Frequency: Daily Modality: individual Progress: 80%  Related Problem: Develop healthy cognitive patterns and beliefs about self and the world that lead to alleviation and help prevent the relapse of mood episodes. Description: Take prescribed medications as directed. Target Date: 2024-01-08 Frequency: Daily Modality: individual Progress: 90%  Related Problem: Develop healthy cognitive patterns and beliefs about self and the world  that lead to alleviation and help prevent the relapse of mood episodes. Description: Discuss and resolve troubling personal and interpersonal issues. Target Date: 2024-01-08 Frequency: Daily Modality: individual Progress: 80% Resolve complicated grief       Target Date: 12-25       Progress: 50%  Client Response full compliance  Service Location Location, [address] [place], [place] [address] -virtual Service Code cpt [phone]  Emotion regulation skills  Related past to present  Rationally challenge thoughts or beliefs/cognitive restructuring  Identify/label emotions  Validate/empathize  Self care activities  Lifestyle change (exercise, nutrition)  Self-monitoring  Facilitate problem solving  Session notes:  f31.9 Bipolar Unspecified.  Goals: Mood stabilization, improved marital relationship, improve self esteem and elimination of self harm thoughts/tendencies. Also, patient struggles with leaving home comfortably and seeks to improve this ability. Target date for these goals is January, 2023. In addition, she needs to improve self care behaviors due to her weight and various medical conditions. Target date for this goal is Feb., 2022. Date revised to December, 2023 to allow time for behavioral implementation. Reports periodic outbursts of rage/anger. Seeks to minimize. Target date is December, 2023. Patient has realized improvement in all areas. Requests additional treatment to facilitate maintenance of progress. Goal date 12-25. Patient's husband died unexpectedly and she is suffering complicated grief. She needs additional counseling to address associated issues and manage feelings of loss, despair and anxiety. Goal date 12-25.  Meds: Vyralar (6mg ),Nuedexta  (40+20mg /day), Adderall (20mg  3X/day), Xanax  (1mg , 3X/day), and Klonopin  (1mg  3X/day).   Patient agrees to a video Public Affairs Consultant) session and understands limits.  She is at home and provider in his office.     Session note:  Veralyn is upset, but did not want to talk about her letter informing her that her disability benefits are being denied. She will work with friend Lorretta for appeal and let me know. She met with her surgeon and he took out her drain. Once healed, she will be able to bathe unassisted (in about 4 weeks). She says she is feeling better having finsished her anti-biotic on Sunday. She is being more treatment compliant. We discussed coverage during my November absence. She claims she does not need to speak with anyone and will be fine until I return. Will see me day before Thanksgiving.                                                                        CONI ALM KERNS, PhD  Time:3:15p-4:00p 45 minutes.                                                                                                  CONI ALM KERNS, PhD

## 2023-12-02 ENCOUNTER — Telehealth: Payer: Self-pay | Admitting: Family Medicine

## 2023-12-02 NOTE — Telephone Encounter (Signed)
-----   Message from Jon VEAR Lindau sent at 12/01/2023  3:06 PM EST ----- Hello,  Patient has active lab orders scheduled from early October but has yet to come in for collection.   Can someone outreach patient to remind her and try to get her a lab visit scheduled? If not collected by end of this year, she will fail several quality care measures.  Thank you! Jon VEAR Lindau, PharmD Clinical Pharmacist [phone]

## 2023-12-02 NOTE — Telephone Encounter (Signed)
 Lmom asking pt to callback to sch lab

## 2023-12-18 NOTE — Telephone Encounter (Signed)
 Lmom asking pt to callback pt need to sch lab work

## 2023-12-24 ENCOUNTER — Ambulatory Visit: Admitting: Psychology

## 2023-12-25 ENCOUNTER — Ambulatory Visit: Admitting: Psychology

## 2023-12-31 ENCOUNTER — Ambulatory Visit: Admitting: Psychology

## 2024-01-07 ENCOUNTER — Ambulatory Visit: Admitting: Psychology

## 2024-01-08 ENCOUNTER — Ambulatory Visit: Admitting: Psychology

## 2024-01-08 DIAGNOSIS — F4381 Prolonged grief disorder: Secondary | ICD-10-CM | POA: Diagnosis not present

## 2024-01-08 DIAGNOSIS — F319 Bipolar disorder, unspecified: Secondary | ICD-10-CM

## 2024-01-08 NOTE — Progress Notes (Signed)
 [name] is a 53 y.o. female patient   Treatment Plan: Date: 01/08/2024  Diagnosis 296.80 (Unspecified bipolar or related disorder) [n/a]  Symptoms Depressed or irritable mood. (Status: maintained) -- No Description Entered  Diminished interest in or enjoyment of activities. (Status: maintained) -- No Description Entered  Lack of energy. (Status: maintained) -- No Description Entered  Low self-esteem. (Status: maintained) -- No Description Entered  Medication Status compliance  Safety none  If Suicidal or Homicidal State Action Taken: unspecified  Current Risk: low Medications Adderall (Dosage: 20mg  4X/day)  Klonapin (Dosage: 1mg  3X/day)  Lamictal  (Dosage: 200mg  2x/day)  Nuedexta  (Dosage: 20mg )  Vyralar (Dosage: 6mg )  Xanax  (Dosage: 1mg  3X/day)  Objectives Related Problem: Develop healthy cognitive patterns and beliefs about self and the world that lead to alleviation and help prevent the relapse of mood episodes. Description: State no longer having thoughts of self-harm. Target Date: 2024-01-08 Frequency: Daily Modality: individual Progress: 80%  Related Problem: Develop healthy cognitive patterns and beliefs about self and the world that lead to alleviation and help prevent the relapse of mood episodes. Description: Verbalize any history of past and present suicidal thoughts and actions. Target Date: 2024-01-08 Frequency: Daily Modality: individual Progress: 80%  Related Problem: Develop healthy cognitive patterns and beliefs about self and the world that lead to alleviation and help prevent the relapse of mood episodes. Description: Take prescribed medications as directed. Target Date: 2024-01-08 Frequency: Daily Modality: individual Progress: 90%  Related Problem: Develop healthy cognitive patterns and  beliefs about self and the world that lead to alleviation and help prevent the relapse of mood episodes. Description: Discuss and resolve troubling personal and interpersonal issues. Target Date: 2024-01-08 Frequency: Daily Modality: individual Progress: 80% Resolve complicated grief       Target Date: 12-25       Progress: 50%  Client Response full compliance  Service Location Location, [address] [place], [place] [address] -virtual Service Code cpt [id number]  Emotion regulation skills  Related past to present  Rationally challenge thoughts or beliefs/cognitive restructuring  Identify/label emotions  Validate/empathize  Self care activities  Lifestyle change (exercise, nutrition)  Self-monitoring  Facilitate problem solving  Session notes:  f31.9 Bipolar Unspecified.  Goals: Mood stabilization, improved marital relationship, improve self esteem and elimination of self harm thoughts/tendencies. Also, patient struggles with leaving home comfortably and seeks to improve this ability. Target date for these goals is January, 2023. In addition, she needs to improve self care behaviors due to her weight and various medical conditions. Target date for this goal is Feb., 2022. Date revised to December, 2023 to allow time for behavioral implementation. Reports periodic outbursts of rage/anger. Seeks to minimize. Target date is December, 2023. Patient has realized improvement in all areas. Requests additional treatment to facilitate maintenance of progress. Goal date 12-25. Patient's husband died unexpectedly and she is suffering complicated grief. She needs additional counseling to address associated issues and manage feelings of loss, despair and anxiety. Goal date 12-25.  Meds: Vyralar (6mg ),Nuedexta  (40+20mg /day), Adderall (20mg  3X/day), Xanax  (1mg , 3X/day), and  Klonopin  (1mg  3X/day).   Patient agrees to be een in provider's office.    Session note: [name] states that she has been  trying to get legal help to make her appeal to [employer]. She has been reaching out to numerous agencies and states she is getting the run around. Also says that some of her friends have let her down and are not available to help. She has been making strong efforts, but unfortunately waited a long time to initiate the appeal. Dr. Vincente is writing a letter of support. She signed a release and I provided a copy of letter I wrote in Sept. 2025 and I added a hand written update. She plans to drop off all materials at the [employer] office tomorrow.                                                                          CONI ALM KERNS, PhD  Time:3:15p-4:00p 45 minutes.                                                                                                  CONI ALM KERNS, PhD

## 2024-01-09 DIAGNOSIS — G4733 Obstructive sleep apnea (adult) (pediatric): Secondary | ICD-10-CM | POA: Diagnosis not present

## 2024-01-21 ENCOUNTER — Ambulatory Visit: Admitting: Psychology

## 2024-01-22 ENCOUNTER — Ambulatory Visit: Admitting: Psychology

## 2024-01-28 ENCOUNTER — Ambulatory Visit: Admitting: Psychology

## 2024-02-05 ENCOUNTER — Ambulatory Visit: Admitting: Psychology

## 2024-02-05 DIAGNOSIS — F319 Bipolar disorder, unspecified: Secondary | ICD-10-CM | POA: Diagnosis not present

## 2024-02-05 DIAGNOSIS — F4381 Prolonged grief disorder: Secondary | ICD-10-CM

## 2024-02-05 DIAGNOSIS — F331 Major depressive disorder, recurrent, moderate: Secondary | ICD-10-CM | POA: Diagnosis not present

## 2024-02-05 NOTE — Progress Notes (Signed)
 "                                                                                            [name] is a 54 y.o. female patient   Treatment Plan: Date: 02/05/2024  Diagnosis 296.80 (Unspecified bipolar or related disorder) [n/a]  Symptoms Depressed or irritable mood. (Status: maintained) -- No Description Entered  Diminished interest in or enjoyment of activities. (Status: maintained) -- No Description Entered  Lack of energy. (Status: maintained) -- No Description Entered  Low self-esteem. (Status: maintained) -- No Description Entered  Medication Status compliance  Safety none  If Suicidal or Homicidal State Action Taken: unspecified  Current Risk: low Medications Adderall (Dosage: 20mg  4X/day)  Klonapin (Dosage: 1mg  3X/day)  Lamictal  (Dosage: 200mg  2x/day)  Nuedexta  (Dosage: 20mg )  Vyralar (Dosage: 6mg )  Xanax  (Dosage: 1mg  3X/day)  Objectives Related Problem: Develop healthy cognitive patterns and beliefs about self and the world that lead to alleviation and help prevent the relapse of mood episodes. Description: State no longer having thoughts of self-harm. Target Date: 2024-01-08 Frequency: Daily Modality: individual Progress: 80%  Related Problem: Develop healthy cognitive patterns and beliefs about self and the world that lead to alleviation and help prevent the relapse of mood episodes. Description: Verbalize any history of past and present suicidal thoughts and actions. Target Date: 2024-01-08 Frequency: Daily Modality: individual Progress: 80%  Related Problem: Develop healthy cognitive patterns and beliefs about self and the world that lead to alleviation and help prevent the relapse of mood episodes. Description: Take prescribed medications as directed. Target Date: 2024-01-08 Frequency: Daily Modality: individual Progress: 90%  Related Problem: Develop  healthy cognitive patterns and beliefs about self and the world that lead to alleviation and help prevent the relapse of mood episodes. Description: Discuss and resolve troubling personal and interpersonal issues. Target Date: 2024-01-08 Frequency: Daily Modality: individual Progress: 80% Resolve complicated grief       Target Date: 12-25       Progress: 50%  Client Response full compliance  Service Location Location, [address] [place], [place] [address] -virtual Service Code cpt [phone]  Emotion regulation skills  Related past to present  Rationally challenge thoughts or beliefs/cognitive restructuring  Identify/label emotions  Validate/empathize  Self care activities  Lifestyle change (exercise, nutrition)  Self-monitoring  Facilitate problem solving  Session notes:  f31.9 Bipolar Unspecified.  Goals: Mood stabilization, improved marital relationship, improve self esteem and elimination of self harm thoughts/tendencies. Also, patient struggles with leaving home comfortably and seeks to improve this ability. Target date for these goals is January, 2023. In addition, she needs to improve self care behaviors due to her weight and various medical conditions. Target date for this goal is Feb., 2022. Date revised to December, 2023 to allow time for behavioral implementation. Reports periodic outbursts of rage/anger. Seeks to minimize. Target date is December, 2023. Patient has realized improvement in all areas. Requests additional treatment to facilitate maintenance of progress. Goal date 12-25. Patient's husband died unexpectedly and she is suffering complicated grief. She needs additional counseling to address associated issues and manage feelings of loss, despair and  anxiety. Goal date 12-25.  Meds: Vyralar (6mg ),Nuedexta  (40+20mg /day), Adderall (20mg  3X/day), Xanax  (1mg , 3X/day), and Klonopin  (1mg  3X/day).   Patient agrees to be een in provider's office.    Session note:  [name] states that she has been trying to get legal help to make her appeal to [employer]. She has been reaching out to numerous agencies and states she is getting the run around. Also says that some of her friends have let her down and are not available to help. She has been making strong efforts, but unfortunately waited a long time to initiate the appeal. Dr. Vincente is writing a letter of support. She signed a release and I provided a copy of letter I wrote in Sept. 2025 and I added a hand written update. She plans to drop off all materials at the [employer] office tomorrow.                                                                          CONI ALM KERNS, PhD  Time:3:15p-4:00p 45 minutes.                                                                                                                                                                               [name] is a 54 y.o. female patient   Treatment Plan: Date: 02/05/2024  Diagnosis 296.80 (Unspecified bipolar or related disorder) [n/a]  Symptoms Depressed or irritable mood. (Status: maintained) -- No Description Entered  Diminished interest in or enjoyment of activities. (Status: maintained) -- No Description Entered  Lack of energy. (Status: maintained) -- No Description Entered  Low self-esteem. (Status: maintained) -- No Description Entered  Medication Status compliance  Safety none  If Suicidal or Homicidal State Action Taken: unspecified  Current Risk: low Medications Adderall (Dosage: 20mg  4X/day)  Klonapin (Dosage: 1mg  3X/day)  Lamictal  (Dosage: 200mg  2x/day)  Nuedexta  (Dosage: 20mg )  Vyralar (Dosage: 6mg )  Xanax  (Dosage: 1mg  3X/day)  Objectives Related Problem: Develop healthy cognitive patterns and beliefs about  self and the world that lead to alleviation and help prevent the relapse of mood episodes. Description: State no longer having thoughts of self-harm. Target Date: 2025-01-07 Frequency: Daily Modality: individual Progress: 60%  Related Problem: Develop healthy cognitive patterns and beliefs about self and the world that lead to alleviation and help prevent the relapse of mood episodes. Description: Verbalize any history of past and present suicidal thoughts and actions.  Target Date: 2025-01-07 Frequency: Daily Modality: individual Progress: 80%  Related Problem: Develop healthy cognitive patterns and beliefs about self and the world that lead to alleviation and help prevent the relapse of mood episodes. Description: Take prescribed medications as directed. Target Date: 2024-01-08 Frequency: Daily Modality: individual Progress: 100%  Related Problem: Develop healthy cognitive patterns and beliefs about self and the world that lead to alleviation and help prevent the relapse of mood episodes. Description: Discuss and resolve troubling personal and interpersonal issues. Target Date: 2025-01-07 Frequency: Daily Modality: individual Progress: 80% Resolve complicated grief       Target Date: 12-25       Progress: 50%  Client Response full compliance  Service Location Location, [address] [place], [place] [address] -virtual Service Code cpt [phone]  Emotion regulation skills  Related past to present  Rationally challenge thoughts or beliefs/cognitive restructuring  Identify/label emotions  Validate/empathize  Self care activities  Lifestyle change (exercise, nutrition)  Self-monitoring  Facilitate problem solving  Session notes:  f31.9 Bipolar Unspecified.  Goals: Mood stabilization, improved marital relationship, improve self esteem and elimination of self harm thoughts/tendencies. Also, patient struggles with leaving home comfortably and seeks to improve this ability.  Target date for these goals is January, 2023. In addition, she needs to improve self care behaviors due to her weight and various medical conditions. Target date for this goal is Feb., 2022. Date revised to December, 2023 to allow time for behavioral implementation. Reports periodic outbursts of rage/anger. Seeks to minimize. Target date is December, 2023. Patient has realized improvement in all areas. Requests additional treatment to facilitate maintenance of progress. Goal date 02/21/2025. Patient's husband died unexpectedly and she is suffering complicated grief. She needs additional counseling to address associated issues and manage feelings of loss, despair and anxiety. Goal date 12-25.  Meds: Vyralar (6mg ),Nuedexta  (40+20mg /day), Adderall (20mg  3X/day), Xanax  (1mg , 3X/day), and Klonopin  (1mg  3X/day).   Patient agrees to be seen in provider's office.    Session note: Haili states that she met with Dr. Vincente on the 2nd. No med changes. Her back has been bothering her lately and she is having to take anti-inflammatory and pain relievers. She is sleeping excessively. Reports that she remains very depressed and hopeless. She has managed to get out of the house and went to help take care of her aunt while her uncle had to leave the house for appointment. She reports she is taking meds as prescribed and has more independence since having drain removed. She is still seeing her high school friend on occasion.                                                                              CONI ALM KERNS, PhD  Time:3:15p-4:00p 45 minutes.                                                                                                   "

## 2024-02-19 ENCOUNTER — Ambulatory Visit: Admitting: Psychology

## 2024-02-19 DIAGNOSIS — F331 Major depressive disorder, recurrent, moderate: Secondary | ICD-10-CM

## 2024-02-19 NOTE — Progress Notes (Signed)
 "                                                                                                           [name] is a 54 y.o. female patient   Treatment Plan: Date: 02/19/2024  Diagnosis 296.80 (Unspecified bipolar or related disorder) [n/a]  Symptoms Depressed or irritable mood. (Status: maintained) -- No Description Entered  Diminished interest in or enjoyment of activities. (Status: maintained) -- No Description Entered  Lack of energy. (Status: maintained) -- No Description Entered  Low self-esteem. (Status: maintained) -- No Description Entered  Medication Status compliance  Safety none  If Suicidal or Homicidal State Action Taken: unspecified  Current Risk: low Medications Adderall (Dosage: 20mg  4X/day)  Klonapin (Dosage: 1mg  3X/day)  Lamictal  (Dosage: 200mg  2x/day)  Nuedexta  (Dosage: 20mg )  Vyralar (Dosage: 6mg )  Xanax  (Dosage: 1mg  3X/day)  Objectives Related Problem: Develop healthy cognitive patterns and beliefs about self and the world that lead to alleviation and help prevent the relapse of mood episodes. Description: State no longer having thoughts of self-harm. Target Date: 2024-01-08 Frequency: Daily Modality: individual Progress: 80%  Related Problem: Develop healthy cognitive patterns and beliefs about self and the world that lead to alleviation and help prevent the relapse of mood episodes. Description: Verbalize any history of past and present suicidal thoughts and actions. Target Date: 2024-01-08 Frequency: Daily Modality: individual Progress: 80%  Related Problem: Develop healthy cognitive patterns and beliefs about self and the world that lead to alleviation and help prevent the relapse of mood episodes. Description: Take prescribed medications as directed. Target Date: 2024-01-08 Frequency: Daily Modality: individual Progress:  90%  Related Problem: Develop healthy cognitive patterns and beliefs about self and the world that lead to alleviation and help prevent the relapse of mood episodes. Description: Discuss and resolve troubling personal and interpersonal issues. Target Date: 2024-01-08 Frequency: Daily Modality: individual Progress: 80% Resolve complicated grief       Target Date: 12-25       Progress: 50%  Client Response full compliance  Service Location Location, [address] [place], [place] [address] -virtual Service Code cpt [phone]  Emotion regulation skills  Related past to present  Rationally challenge thoughts or beliefs/cognitive restructuring  Identify/label emotions  Validate/empathize  Self care activities  Lifestyle change (exercise, nutrition)  Self-monitoring  Facilitate problem solving  Session notes:  f31.9 Bipolar Unspecified.  Goals: Mood stabilization, improved marital relationship, improve self esteem and elimination of self harm thoughts/tendencies. Also, patient struggles with leaving home comfortably and seeks to improve this ability. Target date for these goals is January, 2023. In addition, she needs to improve self care behaviors due to her weight and various medical conditions. Target date for this goal is Feb., 2022. Date revised to December, 2023 to allow time for behavioral implementation. Reports periodic outbursts of rage/anger. Seeks to minimize. Target date is December, 2023. Patient has realized improvement in all areas. Requests additional treatment to facilitate maintenance of progress. Goal date 12-25. Patient's husband died unexpectedly and she is suffering complicated grief.  She needs additional counseling to address associated issues and manage feelings of loss, despair and anxiety. Goal date 12-25.  Meds: Vyralar (6mg ),Nuedexta  (40+20mg /day), Adderall (20mg  3X/day), Xanax  (1mg , 3X/day), and Klonopin  (1mg  3X/day).   Patient agrees to be een in provider's  office.    Session note: [name] states that she has been trying to get legal help to make her appeal to [employer]. She has been reaching out to numerous agencies and states she is getting the run around. Also says that some of her friends have let her down and are not available to help. She has been making strong efforts, but unfortunately waited a long time to initiate the appeal. Dr. Vincente is writing a letter of support. She signed a release and I provided a copy of letter I wrote in Sept. 2025 and I added a hand written update. She plans to drop off all materials at the [employer] office tomorrow.                                                                          CONI ALM KERNS, PhD  Time:3:15p-4:00p 45 minutes.                                                                                                                                                                               [name] is a 54 y.o. female patient   Treatment Plan: Date: 02/19/2024  Diagnosis 296.80 (Unspecified bipolar or related disorder) [n/a]  Symptoms Depressed or irritable mood. (Status: maintained) -- No Description Entered  Diminished interest in or enjoyment of activities. (Status: maintained) -- No Description Entered  Lack of energy. (Status: maintained) -- No Description Entered  Low self-esteem. (Status: maintained) -- No Description Entered  Medication Status compliance  Safety none  If Suicidal or Homicidal State Action Taken: unspecified  Current Risk: low Medications Adderall (Dosage: 20mg  4X/day)  Klonapin (Dosage: 1mg  3X/day)  Lamictal  (Dosage: 200mg  2x/day)  Nuedexta  (Dosage: 20mg )  Vyralar (Dosage: 6mg )  Xanax  (Dosage: 1mg  3X/day)  Objectives Related Problem: Develop healthy cognitive  patterns and beliefs about self and the world that lead to alleviation and help prevent the relapse of mood episodes. Description: State no longer having thoughts of self-harm. Target Date: 2025-01-07 Frequency: Daily Modality: individual Progress: 60%  Related Problem: Develop healthy cognitive patterns and beliefs about self and the world that lead to alleviation and help prevent the relapse  of mood episodes. Description: Verbalize any history of past and present suicidal thoughts and actions. Target Date: 2025-01-07 Frequency: Daily Modality: individual Progress: 80%  Related Problem: Develop healthy cognitive patterns and beliefs about self and the world that lead to alleviation and help prevent the relapse of mood episodes. Description: Take prescribed medications as directed. Target Date: 2024-01-08 Frequency: Daily Modality: individual Progress: 100%  Related Problem: Develop healthy cognitive patterns and beliefs about self and the world that lead to alleviation and help prevent the relapse of mood episodes. Description: Discuss and resolve troubling personal and interpersonal issues. Target Date: 2025-01-07 Frequency: Daily Modality: individual Progress: 80% Resolve complicated grief       Target Date: 12-25       Progress: 50%  Client Response full compliance  Service Location Location, [address] [place], [place] [address] -virtual Service Code cpt [phone]  Emotion regulation skills  Related past to present  Rationally challenge thoughts or beliefs/cognitive restructuring  Identify/label emotions  Validate/empathize  Self care activities  Lifestyle change (exercise, nutrition)  Self-monitoring  Facilitate problem solving  Session notes:  f31.9 Bipolar Unspecified.  Goals: Mood stabilization, improved marital relationship, improve self esteem and elimination of self harm thoughts/tendencies. Also, patient struggles with leaving home comfortably and  seeks to improve this ability. Target date for these goals is January, 2023. In addition, she needs to improve self care behaviors due to her weight and various medical conditions. Target date for this goal is Feb., 2022. Date revised to December, 2023 to allow time for behavioral implementation. Reports periodic outbursts of rage/anger. Seeks to minimize. Target date is December, 2023. Patient has realized improvement in all areas. Requests additional treatment to facilitate maintenance of progress. Goal date 2025-02-06. Patient's husband died unexpectedly and she is suffering complicated grief. She needs additional counseling to address associated issues and manage feelings of loss, despair and anxiety. Goal date 12-25.  Meds: Vyralar (6mg ),Nuedexta  (40+20mg /day), Adderall (20mg  3X/day), Xanax  (1mg , 3X/day), and Klonopin  (1mg  3X/day).   Patient  had problems in traffic and could not get to appointment. She called to have session on phone. Provider was in his office.     Session note: Monzerat states that she is stressed over a number of different issues. She is thinking she might be manic. Notices that she is talking loud and fast. Sleep is also difficult and she is more disorganized that usual. She feels that she is not herself past few days. Is usually in control and feels like a lot of things are out of control. Told her that she needs to contact Dr. Vincente to let her know about her emotional state. Kenslie states she feels safe, but has more energy than usual.                                                                               CONI ALM KERNS, PhD  Time:3:15p-4:00p 45 minutes.                                                                                                   "

## 2024-03-04 ENCOUNTER — Ambulatory Visit: Admitting: Psychology

## 2024-03-04 NOTE — Progress Notes (Unsigned)
   [name], PhD

## 2024-03-09 ENCOUNTER — Ambulatory Visit: Admitting: Psychology

## 2024-03-18 ENCOUNTER — Ambulatory Visit: Admitting: Psychology

## 2024-03-23 ENCOUNTER — Ambulatory Visit: Admitting: Psychology

## 2024-04-01 ENCOUNTER — Ambulatory Visit: Admitting: Psychology

## 2024-04-06 ENCOUNTER — Ambulatory Visit: Admitting: Psychology

## 2024-04-15 ENCOUNTER — Ambulatory Visit: Admitting: Psychology

## 2024-04-20 ENCOUNTER — Ambulatory Visit

## 2024-04-20 ENCOUNTER — Ambulatory Visit: Admitting: Psychology

## 2024-04-29 ENCOUNTER — Ambulatory Visit: Admitting: Psychology

## 2024-05-04 ENCOUNTER — Ambulatory Visit: Admitting: Psychology

## 2024-05-13 ENCOUNTER — Ambulatory Visit: Admitting: Psychology

## 2024-05-18 ENCOUNTER — Ambulatory Visit: Admitting: Psychology

## 2024-05-27 ENCOUNTER — Ambulatory Visit: Admitting: Psychology

## 2024-06-01 ENCOUNTER — Ambulatory Visit: Admitting: Psychology
